# Patient Record
Sex: Female | Born: 1945 | Race: White | Hispanic: No | Smoking: Former smoker
Health system: Southern US, Community
[De-identification: ages and names within clinical notes are randomized; demographics above are authoritative.]

## PROBLEM LIST (undated history)

## (undated) DIAGNOSIS — K529 Noninfective gastroenteritis and colitis, unspecified: Secondary | ICD-10-CM

## (undated) DIAGNOSIS — Z973 Presence of spectacles and contact lenses: Secondary | ICD-10-CM

## (undated) DIAGNOSIS — I493 Ventricular premature depolarization: Secondary | ICD-10-CM

## (undated) DIAGNOSIS — E7849 Other hyperlipidemia: Secondary | ICD-10-CM

## (undated) DIAGNOSIS — E119 Type 2 diabetes mellitus without complications: Secondary | ICD-10-CM

## (undated) DIAGNOSIS — N289 Disorder of kidney and ureter, unspecified: Secondary | ICD-10-CM

## (undated) DIAGNOSIS — M1990 Unspecified osteoarthritis, unspecified site: Secondary | ICD-10-CM

## (undated) DIAGNOSIS — G629 Polyneuropathy, unspecified: Secondary | ICD-10-CM

## (undated) DIAGNOSIS — Z923 Personal history of irradiation: Secondary | ICD-10-CM

## (undated) DIAGNOSIS — M519 Unspecified thoracic, thoracolumbar and lumbosacral intervertebral disc disorder: Secondary | ICD-10-CM

## (undated) DIAGNOSIS — Z8051 Family history of malignant neoplasm of kidney: Secondary | ICD-10-CM

## (undated) DIAGNOSIS — Z8719 Personal history of other diseases of the digestive system: Secondary | ICD-10-CM

## (undated) DIAGNOSIS — R600 Localized edema: Secondary | ICD-10-CM

## (undated) DIAGNOSIS — K760 Fatty (change of) liver, not elsewhere classified: Secondary | ICD-10-CM

## (undated) DIAGNOSIS — M2550 Pain in unspecified joint: Secondary | ICD-10-CM

## (undated) DIAGNOSIS — M549 Dorsalgia, unspecified: Secondary | ICD-10-CM

## (undated) DIAGNOSIS — Z803 Family history of malignant neoplasm of breast: Secondary | ICD-10-CM

## (undated) DIAGNOSIS — J189 Pneumonia, unspecified organism: Secondary | ICD-10-CM

## (undated) DIAGNOSIS — G4700 Insomnia, unspecified: Secondary | ICD-10-CM

## (undated) DIAGNOSIS — Z8042 Family history of malignant neoplasm of prostate: Secondary | ICD-10-CM

## (undated) DIAGNOSIS — K429 Umbilical hernia without obstruction or gangrene: Secondary | ICD-10-CM

## (undated) DIAGNOSIS — F419 Anxiety disorder, unspecified: Secondary | ICD-10-CM

## (undated) DIAGNOSIS — K219 Gastro-esophageal reflux disease without esophagitis: Secondary | ICD-10-CM

## (undated) DIAGNOSIS — H269 Unspecified cataract: Secondary | ICD-10-CM

## (undated) DIAGNOSIS — C801 Malignant (primary) neoplasm, unspecified: Secondary | ICD-10-CM

## (undated) DIAGNOSIS — I10 Essential (primary) hypertension: Secondary | ICD-10-CM

## (undated) DIAGNOSIS — Z800 Family history of malignant neoplasm of digestive organs: Secondary | ICD-10-CM

## (undated) DIAGNOSIS — Z8489 Family history of other specified conditions: Secondary | ICD-10-CM

## (undated) DIAGNOSIS — R002 Palpitations: Secondary | ICD-10-CM

## (undated) DIAGNOSIS — Z87442 Personal history of urinary calculi: Secondary | ICD-10-CM

## (undated) HISTORY — PX: TONSILLECTOMY: SUR1361

## (undated) HISTORY — DX: Localized edema: R60.0

## (undated) HISTORY — DX: Fatty (change of) liver, not elsewhere classified: K76.0

## (undated) HISTORY — DX: Polyneuropathy, unspecified: G62.9

## (undated) HISTORY — DX: Unspecified cataract: H26.9

## (undated) HISTORY — DX: Other hyperlipidemia: E78.49

## (undated) HISTORY — DX: Disorder of kidney and ureter, unspecified: N28.9

## (undated) HISTORY — DX: Insomnia, unspecified: G47.00

## (undated) HISTORY — DX: Family history of malignant neoplasm of breast: Z80.3

## (undated) HISTORY — PX: BREAST BIOPSY: SHX20

## (undated) HISTORY — DX: Family history of malignant neoplasm of kidney: Z80.51

## (undated) HISTORY — DX: Family history of malignant neoplasm of digestive organs: Z80.0

## (undated) HISTORY — PX: FRACTURE SURGERY: SHX138

## (undated) HISTORY — DX: Noninfective gastroenteritis and colitis, unspecified: K52.9

## (undated) HISTORY — PX: APPENDECTOMY: SHX54

## (undated) HISTORY — PX: MYOMECTOMY: SHX85

## (undated) HISTORY — PX: OTHER SURGICAL HISTORY: SHX169

## (undated) HISTORY — DX: Unspecified osteoarthritis, unspecified site: M19.90

## (undated) HISTORY — PX: RIGHT COLECTOMY: SHX853

## (undated) HISTORY — PX: COLON SURGERY: SHX602

## (undated) HISTORY — DX: Pain in unspecified joint: M25.50

## (undated) HISTORY — DX: Dorsalgia, unspecified: M54.9

## (undated) HISTORY — DX: Anxiety disorder, unspecified: F41.9

## (undated) HISTORY — DX: Personal history of urinary calculi: Z87.442

## (undated) HISTORY — DX: Essential (primary) hypertension: I10

## (undated) HISTORY — DX: Pneumonia, unspecified organism: J18.9

## (undated) HISTORY — DX: Family history of malignant neoplasm of prostate: Z80.42

## (undated) HISTORY — DX: Umbilical hernia without obstruction or gangrene: K42.9

## (undated) HISTORY — DX: Gastro-esophageal reflux disease without esophagitis: K21.9

## (undated) HISTORY — PX: BREAST EXCISIONAL BIOPSY: SUR124

## (undated) HISTORY — DX: Palpitations: R00.2

## (undated) HISTORY — PX: WRIST SURGERY: SHX841

## (undated) HISTORY — PX: KNEE ARTHROSCOPY: SUR90

## (undated) HISTORY — PX: TENDON RELEASE: SHX230

---

## 1984-11-01 HISTORY — PX: COLON RESECTION: SHX5231

## 1990-11-01 HISTORY — PX: CHOLECYSTECTOMY: SHX55

## 1999-08-31 ENCOUNTER — Other Ambulatory Visit: Admission: RE | Admit: 1999-08-31 | Discharge: 1999-08-31 | Payer: Self-pay | Admitting: Obstetrics and Gynecology

## 2000-04-29 ENCOUNTER — Encounter: Payer: Self-pay | Admitting: Obstetrics and Gynecology

## 2000-04-29 ENCOUNTER — Encounter: Admission: RE | Admit: 2000-04-29 | Discharge: 2000-04-29 | Payer: Self-pay | Admitting: Obstetrics and Gynecology

## 2000-08-25 ENCOUNTER — Encounter: Payer: Self-pay | Admitting: *Deleted

## 2000-08-25 ENCOUNTER — Encounter: Admission: RE | Admit: 2000-08-25 | Discharge: 2000-08-25 | Payer: Self-pay | Admitting: *Deleted

## 2000-11-14 ENCOUNTER — Other Ambulatory Visit: Admission: RE | Admit: 2000-11-14 | Discharge: 2000-11-14 | Payer: Self-pay | Admitting: Obstetrics and Gynecology

## 2001-01-19 ENCOUNTER — Encounter: Payer: Self-pay | Admitting: Gastroenterology

## 2001-01-19 ENCOUNTER — Encounter: Admission: RE | Admit: 2001-01-19 | Discharge: 2001-01-19 | Payer: Self-pay | Admitting: Gastroenterology

## 2001-01-25 ENCOUNTER — Encounter: Admission: RE | Admit: 2001-01-25 | Discharge: 2001-01-25 | Payer: Self-pay | Admitting: Gastroenterology

## 2001-01-25 ENCOUNTER — Encounter: Payer: Self-pay | Admitting: Gastroenterology

## 2001-02-15 ENCOUNTER — Ambulatory Visit (HOSPITAL_COMMUNITY): Admission: RE | Admit: 2001-02-15 | Discharge: 2001-02-15 | Payer: Self-pay | Admitting: Gastroenterology

## 2001-02-15 ENCOUNTER — Encounter: Payer: Self-pay | Admitting: Gastroenterology

## 2001-03-09 ENCOUNTER — Encounter: Admission: RE | Admit: 2001-03-09 | Discharge: 2001-03-09 | Payer: Self-pay | Admitting: Gastroenterology

## 2001-03-09 ENCOUNTER — Encounter: Payer: Self-pay | Admitting: Gastroenterology

## 2001-05-05 ENCOUNTER — Encounter (INDEPENDENT_AMBULATORY_CARE_PROVIDER_SITE_OTHER): Payer: Self-pay | Admitting: Specialist

## 2001-05-05 ENCOUNTER — Ambulatory Visit (HOSPITAL_COMMUNITY): Admission: RE | Admit: 2001-05-05 | Discharge: 2001-05-05 | Payer: Self-pay | Admitting: Gastroenterology

## 2001-06-09 ENCOUNTER — Encounter: Payer: Self-pay | Admitting: Obstetrics and Gynecology

## 2001-06-09 ENCOUNTER — Encounter: Admission: RE | Admit: 2001-06-09 | Discharge: 2001-06-09 | Payer: Self-pay | Admitting: Obstetrics and Gynecology

## 2001-11-28 ENCOUNTER — Encounter: Payer: Self-pay | Admitting: Family Medicine

## 2001-11-28 ENCOUNTER — Encounter: Admission: RE | Admit: 2001-11-28 | Discharge: 2001-11-28 | Payer: Self-pay | Admitting: Family Medicine

## 2002-06-15 ENCOUNTER — Encounter: Admission: RE | Admit: 2002-06-15 | Discharge: 2002-06-15 | Payer: Self-pay | Admitting: Family Medicine

## 2002-06-15 ENCOUNTER — Encounter: Payer: Self-pay | Admitting: Family Medicine

## 2002-07-10 ENCOUNTER — Other Ambulatory Visit: Admission: RE | Admit: 2002-07-10 | Discharge: 2002-07-10 | Payer: Self-pay | Admitting: Gynecology

## 2003-06-25 ENCOUNTER — Encounter: Payer: Self-pay | Admitting: Family Medicine

## 2003-06-25 ENCOUNTER — Encounter: Admission: RE | Admit: 2003-06-25 | Discharge: 2003-06-25 | Payer: Self-pay | Admitting: Family Medicine

## 2003-07-23 ENCOUNTER — Other Ambulatory Visit: Admission: RE | Admit: 2003-07-23 | Discharge: 2003-07-23 | Payer: Self-pay | Admitting: Gynecology

## 2004-03-25 ENCOUNTER — Encounter (INDEPENDENT_AMBULATORY_CARE_PROVIDER_SITE_OTHER): Payer: Self-pay | Admitting: Specialist

## 2004-03-25 ENCOUNTER — Ambulatory Visit (HOSPITAL_COMMUNITY): Admission: RE | Admit: 2004-03-25 | Discharge: 2004-03-25 | Payer: Self-pay | Admitting: Orthopedic Surgery

## 2004-03-25 ENCOUNTER — Ambulatory Visit (HOSPITAL_BASED_OUTPATIENT_CLINIC_OR_DEPARTMENT_OTHER): Admission: RE | Admit: 2004-03-25 | Discharge: 2004-03-25 | Payer: Self-pay | Admitting: Orthopedic Surgery

## 2004-04-29 ENCOUNTER — Ambulatory Visit (HOSPITAL_COMMUNITY): Admission: RE | Admit: 2004-04-29 | Discharge: 2004-04-29 | Payer: Self-pay | Admitting: Gastroenterology

## 2004-04-29 ENCOUNTER — Encounter (INDEPENDENT_AMBULATORY_CARE_PROVIDER_SITE_OTHER): Payer: Self-pay | Admitting: Specialist

## 2004-06-25 ENCOUNTER — Encounter: Admission: RE | Admit: 2004-06-25 | Discharge: 2004-06-25 | Payer: Self-pay | Admitting: Family Medicine

## 2004-10-22 ENCOUNTER — Other Ambulatory Visit: Admission: RE | Admit: 2004-10-22 | Discharge: 2004-10-22 | Payer: Self-pay | Admitting: Gynecology

## 2005-02-11 ENCOUNTER — Ambulatory Visit: Payer: Self-pay

## 2005-09-07 ENCOUNTER — Encounter: Admission: RE | Admit: 2005-09-07 | Discharge: 2005-09-07 | Payer: Self-pay | Admitting: Family Medicine

## 2005-09-16 ENCOUNTER — Encounter (INDEPENDENT_AMBULATORY_CARE_PROVIDER_SITE_OTHER): Payer: Self-pay | Admitting: *Deleted

## 2005-09-16 ENCOUNTER — Encounter: Admission: RE | Admit: 2005-09-16 | Discharge: 2005-09-16 | Payer: Self-pay | Admitting: Family Medicine

## 2005-10-04 ENCOUNTER — Ambulatory Visit: Payer: Self-pay | Admitting: Pulmonary Disease

## 2005-10-06 ENCOUNTER — Ambulatory Visit: Payer: Self-pay | Admitting: *Deleted

## 2005-10-20 ENCOUNTER — Encounter (INDEPENDENT_AMBULATORY_CARE_PROVIDER_SITE_OTHER): Payer: Self-pay | Admitting: *Deleted

## 2005-10-20 ENCOUNTER — Ambulatory Visit (HOSPITAL_BASED_OUTPATIENT_CLINIC_OR_DEPARTMENT_OTHER): Admission: RE | Admit: 2005-10-20 | Discharge: 2005-10-20 | Payer: Self-pay | Admitting: General Surgery

## 2005-10-20 ENCOUNTER — Ambulatory Visit (HOSPITAL_COMMUNITY): Admission: RE | Admit: 2005-10-20 | Discharge: 2005-10-20 | Payer: Self-pay | Admitting: General Surgery

## 2005-10-20 ENCOUNTER — Encounter: Admission: RE | Admit: 2005-10-20 | Discharge: 2005-10-20 | Payer: Self-pay | Admitting: General Surgery

## 2005-10-22 ENCOUNTER — Ambulatory Visit: Payer: Self-pay | Admitting: Pulmonary Disease

## 2006-04-06 ENCOUNTER — Ambulatory Visit (HOSPITAL_BASED_OUTPATIENT_CLINIC_OR_DEPARTMENT_OTHER): Admission: RE | Admit: 2006-04-06 | Discharge: 2006-04-06 | Payer: Self-pay | Admitting: Orthopedic Surgery

## 2006-04-27 ENCOUNTER — Other Ambulatory Visit: Admission: RE | Admit: 2006-04-27 | Discharge: 2006-04-27 | Payer: Self-pay | Admitting: Gynecology

## 2006-05-25 ENCOUNTER — Encounter: Admission: RE | Admit: 2006-05-25 | Discharge: 2006-05-25 | Payer: Self-pay | Admitting: General Surgery

## 2006-10-13 ENCOUNTER — Encounter: Admission: RE | Admit: 2006-10-13 | Discharge: 2006-10-13 | Payer: Self-pay | Admitting: Gynecology

## 2007-07-26 ENCOUNTER — Other Ambulatory Visit: Admission: RE | Admit: 2007-07-26 | Discharge: 2007-07-26 | Payer: Self-pay | Admitting: Gynecology

## 2007-07-28 ENCOUNTER — Ambulatory Visit (HOSPITAL_COMMUNITY): Admission: RE | Admit: 2007-07-28 | Discharge: 2007-07-28 | Payer: Self-pay | Admitting: Gynecology

## 2007-07-28 ENCOUNTER — Ambulatory Visit: Payer: Self-pay | Admitting: *Deleted

## 2007-12-21 ENCOUNTER — Encounter: Admission: RE | Admit: 2007-12-21 | Discharge: 2007-12-21 | Payer: Self-pay | Admitting: Family Medicine

## 2008-02-05 ENCOUNTER — Encounter: Admission: RE | Admit: 2008-02-05 | Discharge: 2008-02-05 | Payer: Self-pay | Admitting: Gastroenterology

## 2008-04-17 ENCOUNTER — Encounter: Admission: RE | Admit: 2008-04-17 | Discharge: 2008-04-17 | Payer: Self-pay | Admitting: Gastroenterology

## 2008-06-14 ENCOUNTER — Encounter: Admission: RE | Admit: 2008-06-14 | Discharge: 2008-06-14 | Payer: Self-pay | Admitting: Family Medicine

## 2009-03-03 ENCOUNTER — Encounter: Admission: RE | Admit: 2009-03-03 | Discharge: 2009-03-03 | Payer: Self-pay | Admitting: Family Medicine

## 2009-06-18 ENCOUNTER — Ambulatory Visit: Payer: Self-pay | Admitting: Gynecology

## 2009-06-24 ENCOUNTER — Encounter: Admission: RE | Admit: 2009-06-24 | Discharge: 2009-06-24 | Payer: Self-pay | Admitting: Gynecology

## 2009-08-13 ENCOUNTER — Ambulatory Visit (HOSPITAL_COMMUNITY): Admission: RE | Admit: 2009-08-13 | Discharge: 2009-08-13 | Payer: Self-pay | Admitting: Surgery

## 2009-08-13 ENCOUNTER — Encounter (INDEPENDENT_AMBULATORY_CARE_PROVIDER_SITE_OTHER): Payer: Self-pay | Admitting: Surgery

## 2009-11-07 ENCOUNTER — Encounter: Admission: RE | Admit: 2009-11-07 | Discharge: 2009-11-07 | Payer: Self-pay | Admitting: Family Medicine

## 2009-12-02 HISTORY — PX: OTHER SURGICAL HISTORY: SHX169

## 2009-12-11 ENCOUNTER — Inpatient Hospital Stay (HOSPITAL_COMMUNITY): Admission: AD | Admit: 2009-12-11 | Discharge: 2009-12-12 | Payer: Self-pay | Admitting: Urology

## 2010-01-21 ENCOUNTER — Other Ambulatory Visit: Admission: RE | Admit: 2010-01-21 | Discharge: 2010-01-21 | Payer: Self-pay | Admitting: Gynecology

## 2010-01-21 ENCOUNTER — Ambulatory Visit: Payer: Self-pay | Admitting: Gynecology

## 2010-03-26 ENCOUNTER — Encounter: Admission: RE | Admit: 2010-03-26 | Discharge: 2010-03-26 | Payer: Self-pay | Admitting: Gynecology

## 2010-11-21 ENCOUNTER — Encounter: Payer: Self-pay | Admitting: Family Medicine

## 2010-11-22 ENCOUNTER — Encounter: Payer: Self-pay | Admitting: Obstetrics and Gynecology

## 2011-01-21 LAB — COMPREHENSIVE METABOLIC PANEL
Albumin: 4 g/dL (ref 3.5–5.2)
Alkaline Phosphatase: 51 U/L (ref 39–117)
CO2: 26 mEq/L (ref 19–32)
Calcium: 9.3 mg/dL (ref 8.4–10.5)
Creatinine, Ser: 1.27 mg/dL — ABNORMAL HIGH (ref 0.4–1.2)
Glucose, Bld: 164 mg/dL — ABNORMAL HIGH (ref 70–99)
Potassium: 4.4 mEq/L (ref 3.5–5.1)
Total Bilirubin: 0.7 mg/dL (ref 0.3–1.2)
Total Protein: 7.5 g/dL (ref 6.0–8.3)

## 2011-01-21 LAB — CBC
Hemoglobin: 14.4 g/dL (ref 12.0–15.0)
MCHC: 33.6 g/dL (ref 30.0–36.0)
MCV: 89 fL (ref 78.0–100.0)
Platelets: 259 10*3/uL (ref 150–400)
RBC: 4.8 MIL/uL (ref 3.87–5.11)
RDW: 13.7 % (ref 11.5–15.5)
WBC: 13.1 10*3/uL — ABNORMAL HIGH (ref 4.0–10.5)

## 2011-01-21 LAB — PROTIME-INR
INR: 1.08 (ref 0.00–1.49)
Prothrombin Time: 13.9 s (ref 11.6–15.2)

## 2011-01-21 LAB — APTT: aPTT: 25 s (ref 24–37)

## 2011-01-22 ENCOUNTER — Other Ambulatory Visit: Payer: Self-pay | Admitting: Gastroenterology

## 2011-02-04 LAB — DIFFERENTIAL
Lymphocytes Relative: 26 % (ref 12–46)
Lymphs Abs: 2.2 10*3/uL (ref 0.7–4.0)
Monocytes Relative: 8 % (ref 3–12)
Neutro Abs: 5.3 10*3/uL (ref 1.7–7.7)

## 2011-02-04 LAB — BASIC METABOLIC PANEL
BUN: 18 mg/dL (ref 6–23)
CO2: 25 mEq/L (ref 19–32)
GFR calc Af Amer: >60 mL/min (ref >60)
GFR calc non Af Amer: >60 mL/min (ref >60)
Glucose, Bld: 90 mg/dL (ref 70–99)
Sodium: 140 mEq/L (ref 135–145)

## 2011-02-04 LAB — CBC
HCT: 40.6 % (ref 36.0–46.0)
MCHC: 33.6 g/dL (ref 30.0–36.0)
Platelets: 233 10*3/uL (ref 150–400)
RDW: 13.8 % (ref 11.5–15.5)

## 2011-02-17 ENCOUNTER — Other Ambulatory Visit: Payer: Self-pay | Admitting: Family Medicine

## 2011-02-17 DIAGNOSIS — Z1231 Encounter for screening mammogram for malignant neoplasm of breast: Secondary | ICD-10-CM

## 2011-03-19 NOTE — Op Note (Signed)
NAME:  Lauren [name], Lauren [name]                      ACCOUNT NO.:  [id number]   MEDICAL RECORD NO.:  [id number]                   PATIENT TYPE:  AMB   LOCATION:  DSC                                  FACILITY:  [facility]   PHYSICIAN:  Matthew A. Mina Marble, M.D.           DATE OF BIRTH:  [birth year]   DATE OF PROCEDURE:  DATE OF DISCHARGE:                                 OPERATIVE REPORT   PREOPERATIVE DIAGNOSES:  1. Left lateral epicondylitis.  2. Left wrist internal derangement.  3. Left wrist volar cyst.  4. Left thumb carpometacarpal joint arthritis.   POSTOPERATIVE DIAGNOSES:  1. Left lateral epicondylitis.  2. Left wrist internal derangement.  3. Left wrist volar cyst.  4. Left thumb carpometacarpal joint arthritis.   PROCEDURE:  1. Left wrist arthroscopy with debridement of TFCC tear.  2. Left lateral epicondyle release with partial ostectomy.  3. Open excision left wrist volar cyst.  4. Injection of left thumb CMC joint.   SURGEON:  Dr. Artist Pais. Weingold.   ASSISTANT:  __________  .   ANESTHESIA:  General.   TOURNIQUET TIME:  The tourniquet time is 1 hour, 10 minutes.   COMPLICATIONS:  None.   DRAINS:  None.   OPERATIVE REPORT:  The patient was taken to the operating room after the  induction of adequate general anesthesia.  The left upper extremity was  prepped and draped in the usual sterile fashion.  An Esmarch was used to  exsanguinate the limb.  The tourniquet was then inflated 250 mmHg at this  point in time.  The patient's upper extremity was padded and placed in the  concept retraction tower.  Twelve pounds of countertraction was placed  across the radial carpal joint.  A standard __________  arthroscopic portal  was established.  Visualization revealed degenerative changes throughout the  entire radial carpal joint.  There was a small thinning of the TFCC and a  small tear.  This was debrided using the suction shaver, and ArthroCare  wand.  After this was done,  the instrument was removed from the wrist.  The  patient's hand was removed from the traction tower and fully pronated.  An  incision was made between the FCU and ECU tendon.  Dissection was carried  down between these two, and a cystic mass was encountered over the  __________  articulation.  This was removed in its entirety and sent for  pathologic confirmation.  At this point in time, a third incision was made  over the lateral aspect of the elbow over the lateral epicondylar area.  The  incision was taken out through the subcutaneous tissues for 4 to 5 cm.  The  common extensor was carefully subperiosteally elevated off the lateral  epicondyle.  ECRB insertion was frayed, degenerated and debrided down to  stable tissue.  The wound was thoroughly irrigated.  The interval between  the extensor carpal radialis longus and the common extensor was  closed with  inverted 0 Vicryl sutures after decortication of the lateral epicondyle.  The skin was then closed with running 3-0 Prolene subcuticular stitch.  The  3-0 Prolene was used on the cyst excision, and 5-0 nylon on the arthroscopic  portals.  At this point in time, under sterile conditions, combination of  0.25% plain Marcaine and 1 cc of Kenalog-40 was injected in the [facility] joint of  the left thumb.  The patient was then placed in sterile dressing, Xeroform,  4 x 4s, fluffs, and a splint with the forearm in neutral and the elbow bent  at 90 degrees.  The patient tolerated the procedure well, and went to  recovery in stable fashion.                                               Artist Pais Mina Marble, M.D.    MAW/MEDQ  D:  03/26/2004  T:  03/26/2004  Job:  [id number]

## 2011-03-19 NOTE — Op Note (Signed)
NAME:  Lauren [name], Lauren [name]                      ACCOUNT NO.:  [id number]   MEDICAL RECORD NO.:  [id number]                   PATIENT TYPE:  AMB   LOCATION:  ENDO                                 FACILITY:  [facility]   PHYSICIAN:  Petra Kuba, M.D.                 DATE OF BIRTH:  [birth year]   DATE OF PROCEDURE:  04/29/2004  DATE OF DISCHARGE:                                 OPERATIVE REPORT   PROCEDURE:  Colonoscopy.   INDICATION:  Diarrhea, family history of colon cancer, due for colonic  screening.  Consent was signed after risks, benefits, methods, options  thoroughly discussed in the office.   MEDICINES USED:  1. Demerol 120.  2. Versed 12.   PROCEDURE:  Rectal inspection is pertinent for external hemorrhoids.  Digital exam was negative.  Video colonoscope was inserted and with  abdominal pressure able to be advanced to the anastomosis.  It was a colon  end to small bowel side anastomosis.  We were easily able to advance a short  ways up to the terminal ileum which was normal.  Photo documentation and  random biopsies were obtained and put in the first container.  No  abnormalities were seen on insertion, the scope was slowly withdrawn.  The  prep was adequate.  There was minimal liquid stool that required washing and  suctioning.  On slow withdrawal through the colon, random biopsies of the  colon were obtained but no abnormalities were seen.  Anorectal pull through  on retroflexion confirmed some small hemorrhoids.  The scope was reinserted  a short ways up the left side of the colon, air was suctioned and the scope  removed.  The patient tolerated the procedure well.  There was no obvious  immediate complication.   ENDOSCOPIC DIAGNOSES:  1. Internal/external hemorrhoids.  2. Small bowel side-to-colon end anastomosis.  3. Otherwise within normal limits to the terminal ileum, status post random     biopsy __________.   PLAN:  Await pathology to rule out microscopic colitis.   Continue medicines.  Repeat screening in 5 years.  Consider small bowel follow through __________  sed rate and sprue next if her diarrhea continues.                                               Petra Kuba, M.D.    MEM/MEDQ  D:  04/29/2004  T:  04/29/2004  Job:  [id number]   cc:   C. Duane Lope, M.D.  [address]  [place]  [place] [address]  Fax: [phone]

## 2011-03-19 NOTE — Op Note (Signed)
NAME:  Lauren [name], Lauren [name]            ACCOUNT NO.:  [id number]   MEDICAL RECORD NO.:  [id number]          PATIENT TYPE:  AMB   LOCATION:  DSC                          FACILITY:  [facility]   PHYSICIAN:  Matthew A. Weingold, M.D.DATE OF BIRTH:  [birth year]   DATE OF PROCEDURE:  04/06/2006  DATE OF DISCHARGE:                                 OPERATIVE REPORT   PREOPERATIVE DIAGNOSIS:  Left thumb carpometacarpal joint arthritis.   POSTOPERATIVE DIAGNOSIS:  Left thumb carpometacarpal joint arthritis.   PROCEDURE:  Left thumb carpometacarpal joint suspensionplasty with abductor  pollicis longus tendon transfer.   SURGEON:  Artist Pais. Mina Marble, M.D.   ANESTHESIA:  General.   TOURNIQUET TIME:  1 hour.   No complications or drains.   OPERATIVE REPORT:  The patient was taken to the operating room, where after  the induction of adequate general endotracheal anesthesia the left upper  extremity was prepped and draped in the usual sterile fashion.  An Esmarch  was used to exsanguinate the limb, the tourniquet inflated to 250 mmHg.  At  this point in time an incision was made longitudinally at the [facility] joint,  left thumb, and the incision was carried down through the skin and  subcutaneous tissues.  The snuffbox artery was carefully identified and  retracted.  The capsule overlying the CMC joint was incised longitudinally.  The [facility] joint was dissected down to the level of the base of the thumb  metacarpal and the trapezium.  Once this was done, the trapezium was  carefully excised using a combination of curettes, osteotomes and rongeurs.  Once this was done, a [facility] synovectomy was performed.  After that she had a  transosseous canal created in the thumb metacarpal base using a combination  of a hand drill and awls.  Once this was done a second incision was made  over the musculotendinous junction of the first dorsal compartment.  The APL  tendon was identified at the musculotendinous junction and  transected and  drawn into the distal wound.  At this point in time the APL tendon, which  was still distally-based, was passed from dorsal volar out the thumb  metacarpal, wrapped around the flexor carpi radialis tendon twice under  appropriate tension, and sutured to itself using a 2-0 Fibrewire x2, thus  creating the suspension.  The wound was then thoroughly irrigated.  The  capsule overlying the [facility] joint was repaired with 4-0 Vicryl and both skin  incisions with 3-0 Prolene subcuticular stitches.  Steri-Strips, 4 x 4's and  fluffs and a radial dorsal splint was applied.  The patient tolerated the  procedure well and was taken to the recovery room in stable fashion.      Artist Pais Mina Marble, M.D.  Electronically Signed    MAW/MEDQ  D:  04/06/2006  T:  04/06/2006  Job:  [id number]

## 2011-03-19 NOTE — Procedures (Signed)
[facility]  Patient:    Lauren [name], Lauren [name]                     MRN: [id number] Proc. Date: 05/05/01 Attending:  Petra Kuba, M.D.                           Procedure Report  PROCEDURE:  Esophagogastroduodenoscopy with biopsy.  INDICATIONS FOR PROCEDURE:  A patient with upper abdominal discomfort, abdominal upper GI. Consent was signed after risks, benefits, methods, and options were thoroughly discussed in the office on multiple occasions.  MEDICINES USED:  Demerol 75, Versed 7.  DESCRIPTION OF PROCEDURE:  The video endoscope was inserted by direct vision. A quick look at the posterior pharynx was normal. The scope was inserted down a normal esophagus where a tiny to small hiatal hernia was seen but no significant reflux changes, advanced in the stomach where a lot of bilious secretions were seen and suctioned as well as washed and suctioned. The scope was inserted through an antrum pertinent for some linear antritis through a normal pylorus into a normal duodenal bulb and around the C loop to a normal second portion of the duodenum. The scope was slowly withdrawn back to the bulb and no obvious abnormality was seen in the duodenum or in the bulb. The scope was withdrawn back to the stomach and retroflexed. On retroflexed view of the stomach, there were some proximal gastric polyps the largest was in the proximal stomach and was cold biopsied on straight visualization since we were unable to get adequate positioning to biopsy on retroflexed visualization. The fundus was normal. The scope was straightened and straight visualization of the stomach did reveal a few more small polyps along the proximal and distal greater curve as well as a few on the lesser curve and multiple biopsies were obtained but not all biopsies were sampled. We did take a few biopsies of the antrum and a few of the proximal stomach as well to rule out Helicobacter and confirm the  gastritis. The scope was then slowly withdrawn after air was suctioned and again a good look at the esophagus on slow withdrawal was normal. The patient tolerated the procedure well. There was no obvious or immediate complications.  ENDOSCOPIC DIAGNOSIS: 1. Tiny to small hiatal hernia. 2. Linear antritis status post biopsy. 3. Multiple gastric polyps some of which I biopsied but not all. 4. Otherwise within normal limits EGD.  PLAN:  Await pathology. Continue current management and follow-up p.r.n. or in two to three months to recheck symptoms and make sure no further workup plans are needed.  DD:  05/05/01 TD:  05/05/01 Job: [id number]

## 2011-03-19 NOTE — Op Note (Signed)
NAME:  Lauren [name], Lauren [name]            ACCOUNT NO.:  [id number]   MEDICAL RECORD NO.:  [id number]          PATIENT TYPE:  AMB   LOCATION:  DSC                          FACILITY:  [facility]   PHYSICIAN:  Rose Phi. Maple Hudson, M.D.   DATE OF BIRTH:  31-Aug-1946   DATE OF PROCEDURE:  DATE OF DISCHARGE:                                 OPERATIVE REPORT   PREOPERATIVE DIAGNOSIS:  Abnormal right breast mammogram.   POSTOPERATIVE DIAGNOSIS:  Abnormal right breast mammogram.   OPERATION:  Excision of right breast mass with needle localization and  specimen mammogram.   SURGEON:  Rose Phi. Maple Hudson, M.D.   ANESTHESIA:  MAC.   OPERATIVE PROCEDURE:  Prior to coming to the operating room a localizing  wire had been placed in the upper inner quadrant of the right breast.   The patient was placed on the operating table with the arms extended on the  arm board and the right breast prepped and draped in the usual fashion.  A  somewhat curved incision centered on the previously placed wire was outlined  with a marking pencil and the area thoroughly infiltrated with a local  anesthetic mixture.   Incision was made, and the wire and surrounding tissue were excised.  Hemostasis obtained with cautery.   Specimen mammogram confirmed the removal of the lesion.   Wound was closed with subcuticular 4-0 Monocryl and Steri-Strips.  Dressing  applied. The patient was transferred to the recovery room in satisfactory  condition having tolerated the procedure well.      Rose Phi. Maple Hudson, M.D.  Electronically Signed     PRY/MEDQ  D:  10/20/2005  T:  10/22/2005  Job:  [id number]

## 2011-03-30 ENCOUNTER — Ambulatory Visit: Payer: Self-pay

## 2011-03-30 ENCOUNTER — Ambulatory Visit
Admission: RE | Admit: 2011-03-30 | Discharge: 2011-03-30 | Disposition: A | Discharge status: Home / Self Care | Payer: BC Managed Care – PPO | Source: Ambulatory Visit | Attending: Family Medicine | Admitting: Family Medicine

## 2011-03-30 DIAGNOSIS — Z1231 Encounter for screening mammogram for malignant neoplasm of breast: Secondary | ICD-10-CM

## 2011-05-28 ENCOUNTER — Encounter (INDEPENDENT_AMBULATORY_CARE_PROVIDER_SITE_OTHER): Payer: Self-pay | Admitting: Surgery

## 2011-05-28 ENCOUNTER — Ambulatory Visit (INDEPENDENT_AMBULATORY_CARE_PROVIDER_SITE_OTHER): Payer: Medicare HMO | Admitting: General Surgery

## 2011-05-28 ENCOUNTER — Encounter (INDEPENDENT_AMBULATORY_CARE_PROVIDER_SITE_OTHER): Payer: Self-pay | Admitting: General Surgery

## 2011-05-28 VITALS — BP 144/90 | HR 100 | Temp 97.0°F | Ht 65.0 in | Wt 228.0 lb

## 2011-05-28 DIAGNOSIS — E669 Obesity, unspecified: Secondary | ICD-10-CM

## 2011-05-28 DIAGNOSIS — K429 Umbilical hernia without obstruction or gangrene: Secondary | ICD-10-CM

## 2011-05-28 NOTE — Progress Notes (Signed)
Subjective:     Patient ID: Lauren [name], female   DOB: 11-16-1945, 65 y.o.   MRN: [id number]  HPIPatient has a small umbilical hernia that she is noted more recently she's had a previous left gallbladder that P. Young in 1992 and she had had a colectomy for diverticulitis by him several years prior to that she said she had not noticed the weakness at the navel until recently her mother has been in a nursing home and there were apparent her mother's home for site old and doing more strenuous activities and usual patient also retired recently and reported the question of the navel to her medical physician and he felt that she probably had a small umbilical hernia   Review of Systems Past Medical History  Diagnosis Date  . Pneumonia   . GERD (gastroesophageal reflux disease)   . Hypertension   . Neuropathy   . Endometriosis   . Colitis   . Arthritis   . Nipple discharge   . Reflux   . Menopause           Objective:   Physical Exam  The patient on physical exam has a very small fascial defect at the umbilicus when she strains you can feel probably about a centimeter area that protrudes but it reduces spontaneously. On checking with the ultrasound and this is a very small hernia at the fascia below Dr. Maple Hudson do the laparotomy for sigmoid colectomy shows no evidence of any incisional weaknesses or problems patient is significantly overweight and I would recommend that she continue exercise and trying to get her weight into a better range her daughter in law her daughter who works for Dr. Sharol Roussel his caution her  not doing the lefting or exercise don't do anything.  I would say her that she really get a weight reduction diet and exercise and see if she can pull probably 20 pounds of she is having symptoms with a hernia I can repair it is a patient with ventral X. patch convenient time. Patient is to have a knee replacement sometime in the future and I think if she can get her weight off it  would greatly help with the recuperation and possibly prevent in need of her knee surgery. I showed her how to check to see if a hernia is getting called and the like to see her for followup in approximately 3 months   Assessment:         Plan:     To start exercise and diet weight reduction plan and let me follow her up in 3 months she may like to get a abdominal binder to where think the hernia so small that is unnecessary. She will call if she is having more symptoms and she is aware that I can repair the hernia with a patch general anesthesia the patient had a convenient time

## 2011-05-28 NOTE — Patient Instructions (Signed)
Try to increase physical activity diet management and hopefully can decrease your weight 20-30 time if you're having pain that the activity as making the umbilical hernia worse he can be repaired with general anesthesia as an outpatient in the near future. The weight reduction with good help greatly in a few nature knee surgery that plans sometime in the future I would like to see you in followup in approximately 3 months to see how things are progressing.

## 2011-05-31 ENCOUNTER — Ambulatory Visit (INDEPENDENT_AMBULATORY_CARE_PROVIDER_SITE_OTHER): Payer: Self-pay | Admitting: Surgery

## 2011-07-07 ENCOUNTER — Other Ambulatory Visit: Payer: Self-pay | Admitting: Orthopaedic Surgery

## 2011-07-07 DIAGNOSIS — M25561 Pain in right knee: Secondary | ICD-10-CM

## 2011-07-10 ENCOUNTER — Ambulatory Visit
Admission: RE | Admit: 2011-07-10 | Discharge: 2011-07-10 | Disposition: A | Discharge status: Home / Self Care | Payer: Self-pay | Source: Ambulatory Visit | Attending: Orthopaedic Surgery | Admitting: Orthopaedic Surgery

## 2011-07-10 DIAGNOSIS — M25561 Pain in right knee: Secondary | ICD-10-CM

## 2011-08-05 ENCOUNTER — Encounter: Payer: Self-pay | Admitting: *Deleted

## 2011-08-05 ENCOUNTER — Ambulatory Visit (INDEPENDENT_AMBULATORY_CARE_PROVIDER_SITE_OTHER): Payer: Medicare HMO | Admitting: Women's Health

## 2011-08-05 ENCOUNTER — Encounter: Payer: Self-pay | Admitting: Women's Health

## 2011-08-05 VITALS — BP 160/90

## 2011-08-05 DIAGNOSIS — B373 Candidiasis of vulva and vagina: Secondary | ICD-10-CM

## 2011-08-05 DIAGNOSIS — R82998 Other abnormal findings in urine: Secondary | ICD-10-CM

## 2011-08-05 DIAGNOSIS — R3915 Urgency of urination: Secondary | ICD-10-CM

## 2011-08-05 DIAGNOSIS — N809 Endometriosis, unspecified: Secondary | ICD-10-CM | POA: Insufficient documentation

## 2011-08-05 DIAGNOSIS — N390 Urinary tract infection, site not specified: Secondary | ICD-10-CM

## 2011-08-05 DIAGNOSIS — I10 Essential (primary) hypertension: Secondary | ICD-10-CM | POA: Insufficient documentation

## 2011-08-05 DIAGNOSIS — B3731 Acute candidiasis of vulva and vagina: Secondary | ICD-10-CM

## 2011-08-05 MED ORDER — CEPHALEXIN 500 MG PO CAPS: 500.0000 mg | ORAL_CAPSULE | Freq: Two times a day (BID) | ORAL | Status: AC

## 2011-08-05 MED ORDER — NYSTATIN-TRIAMCINOLONE 100000-0.1 UNIT/GM-% EX OINT: TOPICAL_OINTMENT | Freq: Two times a day (BID) | CUTANEOUS | Status: DC

## 2011-08-05 NOTE — Progress Notes (Signed)
  Resents with a complaint of increased urinary urgency and frequency, states the burning sensation has lessened. UA today does show 2-4 WBCs and trace bacteria. She is on the last day of a prescription of Macrobid given by her primary care. Was also treated for yeast with Diflucan 150. Denies a fever, or back pain.  Exam: Abdomen is obese nontender. External genitalia is erythematous, and appears atrophic. Speculum exam no discharge vaginal walls are not erythematous. Wet prep is positive for yeast. Bimanual no CMT no adnexal fullness or tenderness or suprapubic tenderness.  Plan: Send urine for C&S, treat with Keflex 500 twice a day for 3 days. Has allergies to Cipro, sulfa, and penicillin from childhood, but states thinks has taken Keflex in the past without a problem. Mycolog ointment to external genitalia twice a day, loose clothes, open to air.  Call for results of culture, return to office if no relief. Also instructed to schedule annual exam with Dr. Audie Box.

## 2011-08-18 ENCOUNTER — Encounter: Payer: Self-pay | Admitting: Women's Health

## 2011-08-18 ENCOUNTER — Ambulatory Visit (INDEPENDENT_AMBULATORY_CARE_PROVIDER_SITE_OTHER): Payer: Medicare HMO | Admitting: Women's Health

## 2011-08-18 VITALS — BP 150/90

## 2011-08-18 DIAGNOSIS — R82998 Other abnormal findings in urine: Secondary | ICD-10-CM

## 2011-08-18 DIAGNOSIS — R3915 Urgency of urination: Secondary | ICD-10-CM

## 2011-08-18 DIAGNOSIS — N898 Other specified noninflammatory disorders of vagina: Secondary | ICD-10-CM

## 2011-08-18 DIAGNOSIS — N9489 Other specified conditions associated with female genital organs and menstrual cycle: Secondary | ICD-10-CM

## 2011-08-18 MED ORDER — ESTRADIOL 10 MCG VA TABS: 1.0000 | ORAL_TABLET | VAGINAL | Status: DC

## 2011-08-18 NOTE — Progress Notes (Signed)
Addended byCammie Mcgee T on: 08/18/2011 11:57 AM   Modules accepted: Orders

## 2011-08-18 NOTE — Progress Notes (Signed)
  Presents with a complaint of urinary urgency, denies pain or back discomfort. Has a history of kidney stones but states it is a different sensation. Also states has vaginal discomfort with sensitivity. She states she's been unable to sleep well due to this discomfort. Treated for yeast 3 weeks ago with Diflucan states  helped somewhat but the problem is still persistent. Denies any vaginal itching, discharge or fever. Postmenopausal on no HRT with no bleeding. UA 1-2 WBCs, + bacteria will check a urine culture. She had a negative urine C&S 3 weeks ago.  Exam abdomen is obese nontender, external genitalia is erythematous at introitus but appears atrophic. Speculum exam vaginal walls were atrophic no discharge or erythema notrd. Wet prep is positive for small amount of yeast.  Plan: Due to the atrophy will treat with vagifem 1 applicator at bedtime for 2 weeks and then 2 times per week thereafter to see if that will help with some of the sensitivity and atrophy. Will continue the Mycolog at at bedtime to external genitalia prn.

## 2011-09-03 ENCOUNTER — Encounter (INDEPENDENT_AMBULATORY_CARE_PROVIDER_SITE_OTHER): Payer: Medicare HMO | Admitting: General Surgery

## 2011-09-15 ENCOUNTER — Encounter (INDEPENDENT_AMBULATORY_CARE_PROVIDER_SITE_OTHER): Payer: Self-pay | Admitting: General Surgery

## 2011-09-16 ENCOUNTER — Encounter (INDEPENDENT_AMBULATORY_CARE_PROVIDER_SITE_OTHER): Payer: Self-pay

## 2011-09-16 ENCOUNTER — Ambulatory Visit (INDEPENDENT_AMBULATORY_CARE_PROVIDER_SITE_OTHER): Payer: Medicare HMO | Admitting: General Surgery

## 2011-09-16 VITALS — BP 132/88 | HR 72 | Temp 97.8°F | Resp 12 | Ht 65.0 in | Wt 227.0 lb

## 2011-09-16 DIAGNOSIS — K429 Umbilical hernia without obstruction or gangrene: Secondary | ICD-10-CM

## 2011-09-16 NOTE — Patient Instructions (Signed)
Tried to get back on her excess weight reduction plan when you desire to have the umbilical hernia repaired call and see one  of my partners .  They will see you and repair your  hernia

## 2011-09-16 NOTE — Progress Notes (Signed)
Subjective:     Patient ID: Lauren [name], female   DOB: [birth year], 65 y.o.   MRN: [id number]  HPIThis lead now returns come in by her husband and when I saw her approximately 4-5 months ago she was going to try to lose weight and and did an umbilical hernia repair since then her mother who has been chronically ill in her late 43s basically died and the patient states that she was put in all of her time to care for mother agreed and etc. and has not really been doing exercise on a weight reduction management her hernia is the same small defect she had a previous laparoscopic cholecystectomy by Dr. Maple Hudson years ago and she still wants to try to lose weight and not plan on doing anything about the hernia at this time . She understands and I'm planning to retire at the next month and when she desires to proceed with her hernia repair will call in the region checked the one one partners who can repair her hernia   Review of Systems     Objective:   Physical ExamBP 132/88  Pulse 72  Temp(Src) 97.8 F (36.6 C) (Temporal)  Resp 12  Ht 5\' 5"  (1.651 m)  Wt 227 lb (102.967 kg)  BMI 37.77 kg/m2  Patient still has a small umbilical hernia that protrudes when she strains is not symptomatic and her weight is unchanged at about 228 pounds. She will see [place] in the future when she desires to proceed with umbilical herniorrhaphy as a out pt    Assessment:     No change in umbilical hernia    Plan:     Return prn

## 2011-09-29 ENCOUNTER — Telehealth: Payer: Self-pay

## 2011-09-29 NOTE — Telephone Encounter (Signed)
PT. IS IMPROVED USING VAGIFEM SAMPLES AND HAS I LEFT BUT CAN NOT AFFORD $70 TO GET RX FOR 8 OF THEM. WANTS TO KNOW IF THERE ARE ANY OTHER CHEAPER OPTIONS. WANTS TO DISCUSS WITH YOU.

## 2011-09-30 MED ORDER — ESTRADIOL 10 MCG VA TABS: 10.0000 ug | ORAL_TABLET | VAGINAL | Status: DC

## 2011-09-30 NOTE — Telephone Encounter (Signed)
PT. NOTIFIED OF NANCY'S NOTE AND WANTS TO CONTINUE VAGIFEM. GAVE PT SAMPLES OF RX.

## 2011-09-30 NOTE — Telephone Encounter (Signed)
Lauren [name], please call patient- estradiol 0.02% cream use twice weekly sparingly review with patient slightly more systemic estrogen absorption. Approximately $25 at [facility]. OR Premarin vaginal cream, coupon for $15  can only be used once, and also offer her more samples of the Vagifem.

## 2011-12-03 ENCOUNTER — Other Ambulatory Visit: Payer: Self-pay | Admitting: Women's Health

## 2011-12-03 ENCOUNTER — Ambulatory Visit (INDEPENDENT_AMBULATORY_CARE_PROVIDER_SITE_OTHER): Payer: Medicare Other | Admitting: Women's Health

## 2011-12-03 ENCOUNTER — Encounter: Payer: Self-pay | Admitting: Women's Health

## 2011-12-03 DIAGNOSIS — R350 Frequency of micturition: Secondary | ICD-10-CM

## 2011-12-03 DIAGNOSIS — N390 Urinary tract infection, site not specified: Secondary | ICD-10-CM

## 2011-12-03 LAB — URINALYSIS W MICROSCOPIC + REFLEX CULTURE
Bilirubin Urine: NEGATIVE
Casts: NONE SEEN
Crystals: NONE SEEN
Glucose, UA: NEGATIVE mg/dL
pH: 6 (ref 5.0–8.0)

## 2011-12-03 NOTE — Progress Notes (Signed)
Patient ID: Lauren [name], female   DOB: Jun 17, 1946, 66 y.o.   MRN: [id number] Presents with a complaint urinary frequency, urgency, slight burning at initiation of urination, denies any pain at the end of the stream of urination. UA today shows trace leukocytes, 3-6 WBCs and a few bacteria. Denies any discharge or vaginal itching. Postmenopausal no HRT, with no bleeding.Is using Vagifem twice weekly. Has had some problems with yeast and UTIs.  Exam: External genitalia within normal limits, speculum exam scant white discharge, no erythema was noted. Bimanual no tenderness or fullness with exam.  Plan: Urine culture pending. Will continue with Vagifem twice weekly.

## 2011-12-06 ENCOUNTER — Ambulatory Visit: Payer: Medicare HMO | Admitting: Women's Health

## 2011-12-07 ENCOUNTER — Telehealth: Payer: Self-pay | Admitting: *Deleted

## 2011-12-07 NOTE — Telephone Encounter (Signed)
Pt is called to follow up with urine culture results 12/03/11. Please advise

## 2011-12-08 NOTE — Telephone Encounter (Signed)
Pt informed with the below. 

## 2011-12-08 NOTE — Telephone Encounter (Signed)
Lauren [name] had called 12/06/11 and left message. Urine culture was negative. She needs no medication.

## 2011-12-27 ENCOUNTER — Other Ambulatory Visit (HOSPITAL_COMMUNITY)
Admission: RE | Admit: 2011-12-27 | Discharge: 2011-12-27 | Disposition: A | Discharge status: Home / Self Care | Payer: Medicare Other | Source: Ambulatory Visit | Attending: Obstetrics and Gynecology | Admitting: Obstetrics and Gynecology

## 2011-12-27 ENCOUNTER — Ambulatory Visit (INDEPENDENT_AMBULATORY_CARE_PROVIDER_SITE_OTHER): Payer: Medicare Other | Admitting: Women's Health

## 2011-12-27 ENCOUNTER — Encounter: Payer: Self-pay | Admitting: Women's Health

## 2011-12-27 VITALS — BP 148/98 | Ht 64.5 in | Wt 228.0 lb

## 2011-12-27 DIAGNOSIS — Z780 Asymptomatic menopausal state: Secondary | ICD-10-CM

## 2011-12-27 DIAGNOSIS — Z1211 Encounter for screening for malignant neoplasm of colon: Secondary | ICD-10-CM

## 2011-12-27 DIAGNOSIS — N951 Menopausal and female climacteric states: Secondary | ICD-10-CM

## 2011-12-27 DIAGNOSIS — Z124 Encounter for screening for malignant neoplasm of cervix: Secondary | ICD-10-CM | POA: Insufficient documentation

## 2011-12-27 DIAGNOSIS — Z01419 Encounter for gynecological examination (general) (routine) without abnormal findings: Secondary | ICD-10-CM

## 2011-12-27 MED ORDER — ESTRADIOL 10 MCG VA TABS: 10.0000 ug | ORAL_TABLET | VAGINAL | Status: DC

## 2011-12-27 NOTE — Progress Notes (Signed)
Lauren [name] 1946-07-03 [id number]    History:    The patient presents for breast and pelvic exam. Postmenopausal on no HRT with no bleeding. Using Vagifem twice weekly with good relief of vaginal dryness. History of normal Paps. History of benign breast biopsies on both left and right breast. Has not had a bone density in many years. Normal colonoscopy in 2012.   Past medical history, past surgical history, family history and social history were all reviewed and documented in the EPIC chart. Retired this past year.   ROS:  A  ROS was performed and pertinent positives and negatives are included in the history.  Exam:  Filed Vitals:   12/27/11 0916  BP: 148/98    General appearance:  Normal Head/Neck:  Normal, without cervical or supraclavicular adenopathy. Thyroid:  Symmetrical, normal in size, without palpable masses or nodularity. Respiratory  Effort:  Normal  Auscultation:  Clear without wheezing or rhonchi Cardiovascular  Auscultation:  Regular rate, without rubs, murmurs or gallops  Edema/varicosities:  Not grossly evident Abdominal  Soft,nontender, without masses, guarding or rebound.  Liver/spleen:  No organomegaly noted  Hernia:  None appreciated  Skin  Inspection:  Grossly normal  Palpation:  Grossly normal Neurologic/psychiatric  Orientation:  Normal with appropriate conversation.  Mood/affect:  Normal  Genitourinary    Breasts: Examined lying and sitting.     Right: Without masses, retractions, discharge or axillary adenopathy.     Left: Without masses, retractions, discharge or axillary adenopathy.   Inguinal/mons:  Normal without inguinal adenopathy  External genitalia:  Normal  BUS/Urethra/Skene's glands:  Normal  Bladder:  Normal  Vagina:  Normal  Cervix:  Normal  Uterus:   normal in size, shape and contour.  Midline and mobile  Adnexa/parametria:     Rt: Without masses or tenderness.   Lt: Without masses or tenderness.  Anus and  perineum: Normal  Digital rectal exam: Normal sphincter tone without palpated masses or tenderness  Assessment/Plan:  66 y.o. M. WF G2 P2  for breast and pelvic exam.   Normal postmenopausal exam on Vagifem twice weekly and no bleeding Hypertension/colitis-primary care labs and meds.  Plan:. Schedule DEXA here. Home safety and fall prevention discussed. SBE's, annual mammogram, exercise, calcium rich diet, vitamin D 2000 daily encouraged. Discussed importance of decreasing calories and increasing exercise for weight loss for health. Weight Watchers reviewed,. Uses Vagifem one applicator twice weekly for vaginal dryness with fair relief. Prescription, proper use given and reviewed minimal  systemic absorption. Instructed to get the zostovac and Pneumovax vaccine at primary care. Pap only.  Harrington Challenger [facility], 1:43 PM 12/27/2011

## 2011-12-28 ENCOUNTER — Other Ambulatory Visit: Payer: Self-pay | Admitting: Gynecology

## 2011-12-28 DIAGNOSIS — Z13820 Encounter for screening for osteoporosis: Secondary | ICD-10-CM

## 2011-12-28 DIAGNOSIS — Z780 Asymptomatic menopausal state: Secondary | ICD-10-CM

## 2012-01-06 ENCOUNTER — Ambulatory Visit (INDEPENDENT_AMBULATORY_CARE_PROVIDER_SITE_OTHER): Payer: Medicare Other

## 2012-01-06 DIAGNOSIS — Z13820 Encounter for screening for osteoporosis: Secondary | ICD-10-CM

## 2012-03-03 ENCOUNTER — Other Ambulatory Visit: Payer: Self-pay | Admitting: Gynecology

## 2012-03-08 ENCOUNTER — Ambulatory Visit (INDEPENDENT_AMBULATORY_CARE_PROVIDER_SITE_OTHER): Payer: Medicare Other | Admitting: Gynecology

## 2012-03-08 ENCOUNTER — Telehealth: Payer: Self-pay | Admitting: *Deleted

## 2012-03-08 ENCOUNTER — Encounter: Payer: Self-pay | Admitting: Gynecology

## 2012-03-08 ENCOUNTER — Other Ambulatory Visit: Payer: Self-pay | Admitting: *Deleted

## 2012-03-08 DIAGNOSIS — N644 Mastodynia: Secondary | ICD-10-CM

## 2012-03-08 NOTE — Telephone Encounter (Signed)
appt set at [facility] 03/10/12 @ 3:50

## 2012-03-08 NOTE — Progress Notes (Signed)
Addended by: Valeda Malm L on: 03/08/2012 10:56 AM   Modules accepted: Orders

## 2012-03-08 NOTE — Telephone Encounter (Signed)
Message copied by Mckinley Jewel, Tanyon Alipio L on Wed Mar 08, 2012 11:00 AM ------      Message from: Dara Lords      Created: Wed Mar 08, 2012  9:53 AM       Aliayah Tyer, schedule a diagnostic mammogram and ultrasound at the breast Center reference 3 week history of tenderness in the left breast left tail of Spence. Physical exam is negative. Prior biopsy site in this area.

## 2012-03-08 NOTE — Patient Instructions (Signed)
Office will contact you to arrange diagnostic mammography and ultrasound. If tenderness persists for a month represent for reexamination.

## 2012-03-08 NOTE — Progress Notes (Signed)
Patient presents having called to schedule her routine mammogram and told him she was having some tenderness in her left breast and they've referred her to the office before doing the mammogram. Notes of the last several weeks left breast tenderness in the tail of Spence radiating towards the nipple. Notes that she has had some tenderness in this breast on an off over the years and has had a benign breast biopsy in this area. No nipple discharge or other symptoms.  Exam with Sherrilyn Rist chaperone present Both breast examined lying and sitting. Right with well-healed biopsy scar, no masses, retractions, discharge, adenopathy. Left with well-healed biopsy scar, no masses, retractions, discharge, adenopathy.  Area she is pointing to is in the tail of Spence region and there are no palpable abnormalities.  Assessment and plan: Mastodynia left tail of Spence. We'll plan diagnostic mammography and ultrasound. If negative expectant management with repeat exam in a month if her symptoms persist.  If testing otherwise than normal we'll triage based on the results.

## 2012-03-10 ENCOUNTER — Ambulatory Visit
Admission: RE | Admit: 2012-03-10 | Discharge: 2012-03-10 | Disposition: A | Discharge status: Home / Self Care | Payer: Medicare Other | Source: Ambulatory Visit | Attending: Gynecology | Admitting: Gynecology

## 2012-03-10 DIAGNOSIS — N644 Mastodynia: Secondary | ICD-10-CM

## 2012-03-23 ENCOUNTER — Other Ambulatory Visit: Payer: Self-pay | Admitting: Gynecology

## 2012-03-23 DIAGNOSIS — Z1231 Encounter for screening mammogram for malignant neoplasm of breast: Secondary | ICD-10-CM

## 2012-04-04 ENCOUNTER — Other Ambulatory Visit: Payer: Self-pay | Admitting: *Deleted

## 2012-04-04 ENCOUNTER — Encounter: Payer: Self-pay | Admitting: Gynecology

## 2012-04-04 ENCOUNTER — Ambulatory Visit (INDEPENDENT_AMBULATORY_CARE_PROVIDER_SITE_OTHER): Payer: Medicare Other | Admitting: Gynecology

## 2012-04-04 ENCOUNTER — Telehealth: Payer: Self-pay | Admitting: *Deleted

## 2012-04-04 DIAGNOSIS — R0789 Other chest pain: Secondary | ICD-10-CM

## 2012-04-04 DIAGNOSIS — N644 Mastodynia: Secondary | ICD-10-CM

## 2012-04-04 DIAGNOSIS — R0781 Pleurodynia: Secondary | ICD-10-CM

## 2012-04-04 MED ORDER — IBUPROFEN 800 MG PO TABS: 800.0000 mg | ORAL_TABLET | Freq: Three times a day (TID) | ORAL | Status: AC | PRN

## 2012-04-04 NOTE — Progress Notes (Signed)
Patient presents in follow up of left breast discomfort. She had left tell of Spence discomfort that we ultimately did a diagnostic mammogram and ultrasound which were negative. She notes that that discomfort actually has gotten better but that she has more of a midline 6:00 periphery of breast or chest wall discomfort of the last 2 months that has persisted. She knows that when she moves her arm to brush her hair or pushing over this area. No palpable abnormalities on self breast exam.  Exam was Sherrilyn Rist chaperone present Breasts examined lying and sitting without masses retractions discharge adenopathy. The area she's pointing to is the junction 6:00 of the breast and chest wall. There is no palpable or overlying skin changes.  Assessment and plan: Unclear if this is breast tissue tenderness versus chest wall tenderness. Recommended ribs films, ibuprofen 800 mg twice a day x2 weeks, heat to the area, avoidance of her underwire bra with a sports bra and then go from there. If the discomfort would continue that I am going to refer to Gen. Surgery to see if any further studies need to be done.  Patient agrees with the plan.

## 2012-04-04 NOTE — Telephone Encounter (Signed)
Message copied by Libby Maw on Tue Apr 04, 2012  3:31 PM ------      Message from: Dara Lords      Created: Tue Apr 04, 2012 11:27 AM       Schedule chest x-ray and left rib films reference tenderness overlying lower rib cage midline anterior chest wall

## 2012-04-04 NOTE — Patient Instructions (Signed)
Office will contact you to arrange the rib/chest x-ray films. Start ibuprofen 800 mg 2 times daily for 2 weeks. Heat to the tender area. Switch to a sports bra. Follow up if discomfort continues I'll have general surgery see you.

## 2012-04-04 NOTE — Telephone Encounter (Signed)
Patient informed appt set up at [facility] on 04/07/12 @ 1:45pm. Order in pc.

## 2012-04-07 ENCOUNTER — Ambulatory Visit (HOSPITAL_COMMUNITY)
Admission: RE | Admit: 2012-04-07 | Discharge: 2012-04-07 | Disposition: A | Discharge status: Home / Self Care | Payer: Medicare Other | Source: Ambulatory Visit | Attending: Gynecology | Admitting: Gynecology

## 2012-04-07 ENCOUNTER — Ambulatory Visit
Admission: RE | Admit: 2012-04-07 | Discharge: 2012-04-07 | Disposition: A | Discharge status: Home / Self Care | Payer: Medicare Other | Source: Ambulatory Visit | Attending: Gynecology | Admitting: Gynecology

## 2012-04-07 DIAGNOSIS — R0781 Pleurodynia: Secondary | ICD-10-CM

## 2012-04-07 DIAGNOSIS — R079 Chest pain, unspecified: Secondary | ICD-10-CM | POA: Insufficient documentation

## 2012-04-07 DIAGNOSIS — Z1231 Encounter for screening mammogram for malignant neoplasm of breast: Secondary | ICD-10-CM

## 2012-04-07 DIAGNOSIS — R071 Chest pain on breathing: Secondary | ICD-10-CM | POA: Insufficient documentation

## 2012-04-07 DIAGNOSIS — R0789 Other chest pain: Secondary | ICD-10-CM

## 2012-04-07 DIAGNOSIS — Z87891 Personal history of nicotine dependence: Secondary | ICD-10-CM | POA: Insufficient documentation

## 2012-07-24 ENCOUNTER — Encounter (INDEPENDENT_AMBULATORY_CARE_PROVIDER_SITE_OTHER): Payer: Self-pay | Admitting: Surgery

## 2012-07-24 ENCOUNTER — Ambulatory Visit (INDEPENDENT_AMBULATORY_CARE_PROVIDER_SITE_OTHER): Payer: Medicare Other | Admitting: Surgery

## 2012-07-24 VITALS — BP 120/78 | HR 96 | Temp 97.6°F | Ht 65.0 in | Wt 226.6 lb

## 2012-07-24 DIAGNOSIS — K429 Umbilical hernia without obstruction or gangrene: Secondary | ICD-10-CM

## 2012-07-24 NOTE — Patient Instructions (Signed)
[facility]  HERNIA REPAIR POST OP INSTRUCTIONS  Always review your discharge instruction sheet given to you by the facility where your surgery was performed.  1. A  prescription for pain medication may be given to you upon discharge.  Take your pain medication as prescribed.  If narcotic pain medicine is not needed, then you may take acetaminophen (Tylenol) or ibuprofen (Advil) as needed. 2. Take your usually prescribed medications unless otherwise directed. 3. If you need a refill on your pain medication, please contact your pharmacy.  They will contact our office to request authorization. Prescriptions will not be filled after 5 pm daily or on weekends. 4. You should follow a light diet the first 24 hours after arrival home, such as soup and crackers or toast.  Be sure to include plenty of fluids daily.  Resume your normal diet the day after surgery. 5. Most patients will experience some swelling and bruising around the surgical site.  Ice packs and reclining will help.  Swelling and bruising can take several days to resolve.  6. It is common to experience some constipation if taking pain medication after surgery.  Increasing fluid intake and taking a stool softener (such as Colace) will usually help or prevent this problem from occurring.  A mild laxative (Milk of Magnesia or Miralax) should be taken according to package directions if there are no bowel movements after 48 hours. 7. Unless discharge instructions indicate otherwise, you may remove your bandages 24-48 hours after surgery, and you may shower at that time.  You may have steri-strips (small skin tapes) in place directly over the incision.  These strips should be left on the skin for 7-10 days.  If your surgeon used skin glue on the incision, you may shower in 24 hours.  The glue will flake off over the next 2-3 weeks.  Any sutures or staples will be removed at the office during your follow-up visit. 8. ACTIVITIES:  You  may resume regular (light) daily activities beginning the next day-such as daily self-care, walking, climbing stairs-gradually increasing activities as tolerated.  You may have sexual intercourse when it is comfortable.  Refrain from any heavy lifting or straining until approved by your doctor.  You may drive when you are no longer taking prescription pain medication, you can comfortably wear a seatbelt, and you can safely maneuver your car and apply brakes. 9. You should see your doctor in the office for a follow-up appointment approximately 2-3 weeks after your surgery.  Make sure that you call for this appointment within a day or two after you arrive home to insure a convenient appointment time.  WHEN TO CALL YOUR DOCTOR: 1. Fever greater than 101.0 2. Inability to urinate 3. Persistent nausea and/or vomiting 4. Extreme swelling or bruising 5. Continued bleeding from incision 6. Increased pain, redness, or drainage from the incision  The clinic staff is available to answer your questions during regular business hours.  Please don't hesitate to call and ask to speak to one of the nurses for clinical concerns.  If you have a medical emergency, go to the nearest emergency room or call 911.  A surgeon from [facility] is always on call for the hospital.   [facility] [address], [address], [place], [place]  [address]  [phone] ? [phone] ? FAX [phone] [email or web address]   

## 2012-07-24 NOTE — Progress Notes (Signed)
General Surgery [facility]  Chief Complaint  Patient presents with  . Umbilical Hernia    increasing in size, mild pain - primary care is Dr. Vianne Bulls    HISTORY: Patient is a 66 year old white female well known to our surgical practice having undergone right colectomy and a laparoscopic cholecystectomy in the past by Dr. Francina Ames. Patient was seen over one year ago regarding a small umbilical hernia. This is likely an incisional hernia at the site of laparoscopy at the umbilicus. It has gradually increased in size and is becoming more symptomatic. Patient notes an outward protrusion. She has discomfort with physical activity. She presents today to discuss surgical repair.  Patient denies any signs or symptoms of intestinal obstruction.  Past Medical History  Diagnosis Date  . Pneumonia   . GERD (gastroesophageal reflux disease)   . Neuropathy   . Colitis   . Arthritis   . Reflux   . Menopause   . History of kidney stones   . Hypertension   . Endometriosis   . Insomnia   . Vitamin d deficiency   . Umbilical hernia   . Diabetes mellitus      Current Outpatient Prescriptions  Medication Sig Dispense Refill  . balsalazide (COLAZAL) 750 MG capsule       . Cholecalciferol (VITAMIN D PO) Take 4,000 Int'l Units by mouth daily. Twice a week      . cholestyramine (QUESTRAN) 4 GM/DOSE powder Take by mouth daily.        . clonazePAM (KLONOPIN) 0.5 MG tablet Take 0.5 mg by mouth 2 (two) times daily as needed.        . Estradiol (VAGIFEM) 10 MCG TABS Place 1 tablet (10 mcg total) vaginally 2 (two) times a week.  60 tablet  0  . finasteride (PROSCAR) 5 MG tablet Take 0.25 mg by mouth daily.        . furosemide (LASIX) 20 MG tablet       . metFORMIN (GLUCOPHAGE-XR) 500 MG 24 hr tablet       . pantoprazole (PROTONIX) 40 MG tablet Take 40 mg by mouth 2 (two) times daily.       . quinapril (ACCUPRIL) 20 MG tablet Take 40 mg by mouth at bedtime.           Allergies  Allergen Reactions  . Ciprocin-Fluocin-Procin (Fluocinolone Acetonide)   . Penicillins   . Percocet (Oxycodone-Acetaminophen)   . Sulfur   . Tape   . Tetanus Toxoids      Family History  Problem Relation Age of Onset  . Hypertension Father   . Heart disease Father   . Diabetes Father   . Cancer Father     colon, prostate, and kidney  . Hypertension Mother   . Heart disease Maternal Grandmother   . Diabetes Maternal Grandmother   . Heart disease Maternal Grandfather   . Diabetes Maternal Grandfather   . Cancer Maternal Grandfather     pt unaware of what kind     History   Social History  . Marital Status: Married    Spouse Name: N/A    Number of Children: N/A  . Years of Education: N/A   Social History Main Topics  . Smoking status: Former Games developer  . Smokeless tobacco: Never Used   Comment: quit 1983  . Alcohol Use: Yes     rare  . Drug Use: No  . Sexually Active: Yes -- Female partner(s)  Birth Control/ Protection: Post-menopausal   Other Topics Concern  . None   Social History Narrative  . None     REVIEW OF SYSTEMS - PERTINENT POSITIVES ONLY: No signs or symptoms of obstruction. Intermittent discomfort with physical activity. Always reducible.  EXAM: Filed Vitals:   07/24/12 0949  BP: 120/78  Pulse: 96  Temp: 97.6 F (36.4 C)    HEENT: normocephalic; pupils equal and reactive; sclerae clear; dentition good; mucous membranes moist NECK:  symmetric on extension; no palpable anterior or posterior cervical lymphadenopathy; no supraclavicular masses; no tenderness CHEST: clear to auscultation bilaterally without rales, rhonchi, or wheezes CARDIAC: regular rate and rhythm without significant murmur; peripheral pulses are full ABDOMEN: soft without distension; bowel sounds present; no mass; no hepatosplenomegaly; well-healed surgical incisions. Small to moderate sized hernia beneath the umbilicus at the site of previous laparoscopy  consistent with an umbilical or incisional hernia; reducible EXT:  non-tender without edema; no deformity NEURO: no gross focal deficits; no sign of tremor   LABORATORY RESULTS: See [facility] (CHL-Epic) for most recent results   RADIOLOGY RESULTS: See [facility] (CHL-Epic) for most recent results   IMPRESSION: Umbilical or incisional hernia, reducible, mildly symptomatic  PLAN: I discussed with the patient and her husband the indications for repair. I provided him with written literature to review. I have recommended repair with mesh. This can be performed as an outpatient procedure. We discussed restrictions after the procedure. We have discussed the chance of recurrence. They understand and wish to proceed.  The risks and benefits of the procedure have been discussed at length with the patient.  The patient understands the proposed procedure, potential alternative treatments, and the course of recovery to be expected.  All of the patient's questions have been answered at this time.  The patient wishes to proceed with surgery.  Velora Heckler, MD, FACS General & Endocrine Surgery [facility]   Visit Diagnoses: 1. Umbilical hernia     Primary Care Physician: Daisy Floro, MD

## 2012-09-15 ENCOUNTER — Other Ambulatory Visit: Payer: Self-pay | Admitting: Orthopaedic Surgery

## 2012-09-15 DIAGNOSIS — M5410 Radiculopathy, site unspecified: Secondary | ICD-10-CM

## 2012-09-15 DIAGNOSIS — R52 Pain, unspecified: Secondary | ICD-10-CM

## 2012-09-19 ENCOUNTER — Telehealth (INDEPENDENT_AMBULATORY_CARE_PROVIDER_SITE_OTHER): Payer: Self-pay

## 2012-09-19 NOTE — Telephone Encounter (Signed)
Pt called wanting to know if she should delay her hernia repair since she is currently going thru injections, anti inflammatory  and steroid therapy for a back problem. Pt advised that steroids po over time can affect how well she heals from surgery. Pt also advised she will be limited in her movements for a few days after surgery.  Pt advised anti inflammatories can increase her bleeding risk for after surgery.  Pt states she will review this with her Orthopedic surgeon and may cx hernia until after her back heals. Pt to call back with decision.

## 2012-09-20 ENCOUNTER — Ambulatory Visit
Admission: RE | Admit: 2012-09-20 | Discharge: 2012-09-20 | Disposition: A | Discharge status: Home / Self Care | Payer: Medicare Other | Source: Ambulatory Visit | Attending: Orthopaedic Surgery | Admitting: Orthopaedic Surgery

## 2012-09-20 DIAGNOSIS — M5410 Radiculopathy, site unspecified: Secondary | ICD-10-CM

## 2012-09-20 DIAGNOSIS — R52 Pain, unspecified: Secondary | ICD-10-CM

## 2012-09-22 ENCOUNTER — Other Ambulatory Visit: Payer: Medicare Other

## 2012-09-22 ENCOUNTER — Encounter (HOSPITAL_BASED_OUTPATIENT_CLINIC_OR_DEPARTMENT_OTHER): Payer: Self-pay | Admitting: *Deleted

## 2012-09-22 NOTE — Progress Notes (Signed)
Pt states rescheduling until jan 2014. She has notified office.

## 2012-09-25 ENCOUNTER — Telehealth (INDEPENDENT_AMBULATORY_CARE_PROVIDER_SITE_OTHER): Payer: Self-pay

## 2012-09-25 NOTE — Telephone Encounter (Signed)
Received msg from surgery scheduling that pt has cx surgery due to continued back pain. She is being seen by her Back MD and wants to wait till this has resolved.

## 2012-10-03 ENCOUNTER — Encounter (HOSPITAL_BASED_OUTPATIENT_CLINIC_OR_DEPARTMENT_OTHER): Admission: RE | Payer: Self-pay | Source: Ambulatory Visit

## 2012-10-03 ENCOUNTER — Ambulatory Visit (HOSPITAL_BASED_OUTPATIENT_CLINIC_OR_DEPARTMENT_OTHER): Admission: RE | Admit: 2012-10-03 | Payer: Medicare Other | Source: Ambulatory Visit | Admitting: Surgery

## 2012-10-03 HISTORY — DX: Personal history of other diseases of the digestive system: Z87.19

## 2012-10-03 SURGERY — REPAIR, HERNIA, UMBILICAL, ADULT
Anesthesia: General

## 2012-12-11 ENCOUNTER — Other Ambulatory Visit: Payer: Self-pay | Admitting: Otolaryngology

## 2012-12-11 DIAGNOSIS — R1310 Dysphagia, unspecified: Secondary | ICD-10-CM

## 2012-12-11 DIAGNOSIS — K219 Gastro-esophageal reflux disease without esophagitis: Secondary | ICD-10-CM

## 2012-12-18 ENCOUNTER — Other Ambulatory Visit: Payer: Medicare Other

## 2012-12-22 ENCOUNTER — Ambulatory Visit
Admission: RE | Admit: 2012-12-22 | Discharge: 2012-12-22 | Disposition: A | Discharge status: Home / Self Care | Payer: Medicare Other | Source: Ambulatory Visit | Attending: Otolaryngology | Admitting: Otolaryngology

## 2012-12-22 DIAGNOSIS — R1310 Dysphagia, unspecified: Secondary | ICD-10-CM

## 2012-12-22 DIAGNOSIS — K219 Gastro-esophageal reflux disease without esophagitis: Secondary | ICD-10-CM

## 2013-01-26 ENCOUNTER — Encounter: Payer: Self-pay | Admitting: Women's Health

## 2013-01-26 ENCOUNTER — Ambulatory Visit (INDEPENDENT_AMBULATORY_CARE_PROVIDER_SITE_OTHER): Payer: Medicare Other | Admitting: Women's Health

## 2013-01-26 VITALS — BP 124/84 | Ht 64.5 in | Wt 226.0 lb

## 2013-01-26 DIAGNOSIS — E119 Type 2 diabetes mellitus without complications: Secondary | ICD-10-CM | POA: Insufficient documentation

## 2013-01-26 DIAGNOSIS — IMO0001 Reserved for inherently not codable concepts without codable children: Secondary | ICD-10-CM

## 2013-01-26 DIAGNOSIS — Z01419 Encounter for gynecological examination (general) (routine) without abnormal findings: Secondary | ICD-10-CM

## 2013-01-26 DIAGNOSIS — N952 Postmenopausal atrophic vaginitis: Secondary | ICD-10-CM

## 2013-01-26 NOTE — Progress Notes (Signed)
DOROTHEA YOW 1946/07/18 [id number]    History:    The patient presents for gyn exam.  Postmenopausal with no bleeding using Vagifem twice weekly with good relief of vaginal dryness. History of fibroids and myomectomy. Hypertension/diabetes/GERD/colitis/insomnia-primary care manages. History of benign breast biopsies both breasts. Normal colonoscopy 2012. DEXA normal 12/2011 T score -0.3 spine, hip average -0.3. Normal Pap history.   Past medical history, past surgical history, family history and social history were all reviewed and documented in the EPIC chart. Retired from [employer]. Husband health issues/congestive heart failure. Both parents diabetes and hypertension. Father colon cancer.   Exam:  Filed Vitals:   01/26/13 0853  BP: 124/84    General appearance:  Normal Head/Neck:  Normal, without cervical or supraclavicular adenopathy. Thyroid:  Symmetrical, normal in size, without palpable masses or nodularity. Respiratory  Effort:  Normal  Auscultation:  Clear without wheezing or rhonchi Cardiovascular  Auscultation:  Regular rate, without rubs, murmurs or gallops  Edema/varicosities:  Not grossly evident Abdominal  Soft,nontender, without masses, guarding or rebound.  Liver/spleen:  No organomegaly noted  Hernia:  None appreciated  Skin  Inspection:  Grossly normal  Palpation:  Grossly normal Neurologic/psychiatric  Orientation:  Normal with appropriate conversation.  Mood/affect:  Normal  Genitourinary    Breasts: Examined lying and sitting.     Right: Without masses, retractions, discharge or axillary adenopathy.     Left: Without masses, retractions, discharge or axillary adenopathy.   Inguinal/mons:  Normal without inguinal adenopathy  External genitalia:  Normal  BUS/Urethra/Skene's glands:  Normal  Bladder:  Normal  Vagina:  Normal  Cervix:  Normal  Uterus:   normal in size, shape and contour.  Midline and mobile  Adnexa/parametria:     Rt: Without masses  or tenderness.   Lt: Without masses or tenderness.  Anus and perineum: Normal  Digital rectal exam: Normal sphincter tone without palpated masses or tenderness  Assessment/Plan:  67 y.o. MWF G2P2 for gyn exam with complaint of chronic back pain.   Atrophic vaginitis-Vagifem Hypertension/diabetes/GERD/colitis-primary care labs and meds Normal DEXA 2013 Obesity Back pain-primary care and orthopedist  Plan: SBE's, continue annual mammogram, calcium rich diet, vitamin D 2000 daily encouraged. Reviewed importance of increasing regular exercise and decreasing calories for weight loss and health. Home safety and fall prevention discussed. Vagifem twice weekly for dryness prescription, proper use, slight systemic absorption reviewed. Pap normal 2013, new screening guidelines reviewed.    Harrington Challenger [facility]NP, 10:01 AM 01/26/2013

## 2013-01-26 NOTE — Patient Instructions (Signed)

## 2013-01-29 ENCOUNTER — Encounter: Payer: Self-pay | Admitting: Gynecology

## 2013-03-22 ENCOUNTER — Telehealth (INDEPENDENT_AMBULATORY_CARE_PROVIDER_SITE_OTHER): Payer: Self-pay

## 2013-03-22 NOTE — Telephone Encounter (Signed)
Per Dr Ardine Eng  Request pt advised if she needs eval hiatal hernia she will need to be referred to Dr Abbey Chatters or Dr Daphine Deutscher. Appt made with Dr Abbey Chatters. Pt aware.

## 2013-04-04 ENCOUNTER — Other Ambulatory Visit: Payer: Self-pay | Admitting: Gynecology

## 2013-04-04 DIAGNOSIS — Z1231 Encounter for screening mammogram for malignant neoplasm of breast: Secondary | ICD-10-CM

## 2013-04-12 ENCOUNTER — Encounter (INDEPENDENT_AMBULATORY_CARE_PROVIDER_SITE_OTHER): Payer: Self-pay

## 2013-04-13 ENCOUNTER — Telehealth (INDEPENDENT_AMBULATORY_CARE_PROVIDER_SITE_OTHER): Payer: Self-pay | Admitting: General Surgery

## 2013-04-13 ENCOUNTER — Encounter (INDEPENDENT_AMBULATORY_CARE_PROVIDER_SITE_OTHER): Payer: Self-pay | Admitting: General Surgery

## 2013-04-13 ENCOUNTER — Other Ambulatory Visit (INDEPENDENT_AMBULATORY_CARE_PROVIDER_SITE_OTHER): Payer: Self-pay | Admitting: General Surgery

## 2013-04-13 ENCOUNTER — Ambulatory Visit (INDEPENDENT_AMBULATORY_CARE_PROVIDER_SITE_OTHER): Payer: Medicare Other | Admitting: General Surgery

## 2013-04-13 VITALS — BP 158/82 | HR 78 | Resp 16 | Ht 64.75 in | Wt 226.6 lb

## 2013-04-13 DIAGNOSIS — K449 Diaphragmatic hernia without obstruction or gangrene: Secondary | ICD-10-CM

## 2013-04-13 DIAGNOSIS — K219 Gastro-esophageal reflux disease without esophagitis: Secondary | ICD-10-CM

## 2013-04-13 NOTE — Patient Instructions (Signed)
Prior to considering any type of surgery for reflux and hiatal hernia, we need to get a pH monitoring study, and esophageal manometry study, and a gastric emptying study. If the results of these suggest that your symptoms are coming from the hiatal hernia and reflux, then we will discuss all of your surgical options.

## 2013-04-13 NOTE — Telephone Encounter (Signed)
Appt made with Dr Ewing Schlein for 05/24/2013 @ 9:45 am. Patient aware. Awaiting Dr Abbey Chatters to dictate office note- then will send records.

## 2013-04-13 NOTE — Progress Notes (Signed)
Patient ID: Lauren [name], female   DOB: [birth year], 67 y.o.   MRN: [id number]  No chief complaint on file.   HPI Lauren [name] is a 67 y.o. female.   HPI  She is being seen at the request of Dr. Ewing Schlein to discuss possible surgery for hiatal hernia and gastroesophageal reflux disease. She has a globus sensation in her throat. She has no dysphagia. She does have a known small hiatal hernia which has been demonstrated on a barium swallow study. She was initially seen in our office regarding an umbilical hernia but has become interested in possibly having the hiatal hernia repaired as well.  She takes a proton pump inhibitor which seemed to help some with her symptoms. She also takes Zantac.  Past Medical History  Diagnosis Date  . GERD (gastroesophageal reflux disease)   . Neuropathy   . Arthritis   . Menopause   . History of kidney stones   . Hypertension   . Endometriosis   . Insomnia   . Vitamin D deficiency   . Umbilical hernia   . Diabetes mellitus   . History of ulcerative colitis     Past Surgical History  Procedure Laterality Date  . Colon surgery    . Wrist surgery      both  . Myomectomy    . Kidney stone removed  2/11  . Right colectomy    . Tendon release    . Right ureteroscopic stone extraction  12-11-2009  DR [facility]  . Left thumb carpometacarpal joint suspensionplasty  04-06-2006  DR Mina Marble  . Excision right breast mass   10-20-2005  DR Theron Arista [facility]  . Left wrist arthroscopy w/ debridement and removal cyst  03-26-2004  DR Mina Marble  . Cholecystectomy  1992  . Breast biopsy  08-13-2009  DR [facility]    EXCISION LEFT NIPPLE DUCT  . Colon resection  1986    ULCERATIVE COLITIS    Family History  Problem Relation Age of Onset  . Hypertension Father   . Heart disease Father   . Diabetes Father   . Cancer Father     colon, prostate, and kidney  . Hypertension Mother   . Heart disease Maternal Grandmother   . Diabetes Maternal Grandmother   . Heart  disease Maternal Grandfather   . Diabetes Maternal Grandfather   . Cancer Maternal Grandfather     pt unaware of what kind    Social History History  Substance Use Topics  . Smoking status: Former Smoker -- 1.00 packs/day for 10 years    Types: Cigarettes    Quit date: 09/22/1982  . Smokeless tobacco: Never Used     Comment: quit 1983  . Alcohol Use: No     Comment: rare    Allergies  Allergen Reactions  . Percocet (Oxycodone-Acetaminophen) Other (See Comments)    RESPIRATORY DISTRESS  . Ciprocin-Fluocin-Procin (Fluocinolone Acetonide) Hives  . Penicillins Hives  . Sulfa Antibiotics Hives  . Tape   . Tetanus Toxoids Hives    Current Outpatient Prescriptions  Medication Sig Dispense Refill  . balsalazide (COLAZAL) 750 MG capsule       . Cholecalciferol (VITAMIN D PO) Take 4,000 Int'l Units by mouth daily. Twice a week      . cholestyramine (QUESTRAN) 4 GM/DOSE powder Take by mouth daily.        . clonazePAM (KLONOPIN) 0.5 MG tablet Take 0.5 mg by mouth 2 (two) times daily as needed.        Marland Kitchen  Diclofenac Sodium (VOLTAREN PO) Take by mouth.      . Estradiol (VAGIFEM) 10 MCG TABS Place 1 tablet (10 mcg total) vaginally 2 (two) times a week.  60 tablet  0  . furosemide (LASIX) 20 MG tablet       . glucose blood (ACCU-CHEK AVIVA) test strip 1 each by Other route as needed for other. Use as instructed      . HYDROcodone-acetaminophen (NORCO) 7.5-325 MG per tablet Take 1 tablet by mouth every 6 (six) hours as needed for pain.      . metFORMIN (GLUCOPHAGE-XR) 500 MG 24 hr tablet       . methocarbamol (ROBAXIN) 500 MG tablet Take 500 mg by mouth daily. 1 daily prn      . pantoprazole (PROTONIX) 40 MG tablet Take 40 mg by mouth 2 (two) times daily.       . quinapril (ACCUPRIL) 20 MG tablet Take 40 mg by mouth at bedtime.       . traMADol (ULTRAM) 50 MG tablet Take 50 mg by mouth every 6 (six) hours as needed for pain.       No current facility-administered medications for this  visit.    Review of Systems Review of Systems  Constitutional: Negative for activity change and appetite change.  Gastrointestinal: Positive for nausea and abdominal pain.    Blood pressure 158/82, pulse 78, resp. rate 16, height 5' 4.75" (1.645 m), weight 226 lb 9.6 oz (102.785 kg).  Physical Exam Physical Exam  Constitutional: No distress.  Obese female  HENT:  Head: Normocephalic and atraumatic.  Neck: Neck supple.  Cardiovascular: Normal rate and regular rhythm.   Pulmonary/Chest: Effort normal and breath sounds normal.  Abdominal: Soft. She exhibits no mass. There is no tenderness.  Small upper bountiful scars. Subumbilical scar with reducible bulge. Lower midline scar.  Musculoskeletal: She exhibits no edema.  Lymphadenopathy:    She has no cervical adenopathy.  Skin: Skin is warm and dry.    Data Reviewed Barium swallow. No true Dr. Ewing Schlein.  Dr. Ardine Eng note.  Assessment    1. Hiatal hernia. It is not clear to me how significant her gastroesophageal reflux is. Her biscuits complaint is a globus sensation and I do not know if this is related to postnasal drip phenomenon or reflux disease. She is morbidly obese so the success rate of a laparoscopic hiatal hernia  and fundoplication is less. Of note is that laparoscopic gastric bypass and even laparoscopic banding with hernia repair can have a better long-term result and we discussed this.However, she needs some more testing to see how significant the reflux is.  2. 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  3.  Reducible umbilical hernia.     Plan    Will refer her back to Dr. Ewing Schlein for a 24-hour pH study, manometry, and gastric emptying scan. If the results of the suggests that she has significant gastroesophageal reflux disease, then I will have her see one of the bariatric surgeons in the practice to discuss bariatric surgery for her obesity and hiatal hernia/GERD.       Nelsie Domino J 04/13/2013, 5:16  PM

## 2013-04-16 ENCOUNTER — Other Ambulatory Visit (HOSPITAL_COMMUNITY): Payer: Self-pay | Admitting: Gastroenterology

## 2013-04-16 DIAGNOSIS — IMO0001 Reserved for inherently not codable concepts without codable children: Secondary | ICD-10-CM

## 2013-04-16 NOTE — Telephone Encounter (Signed)
Notes faxed to [phone]. Confirmation received.

## 2013-04-30 ENCOUNTER — Encounter (HOSPITAL_COMMUNITY)
Admission: RE | Admit: 2013-04-30 | Discharge: 2013-04-30 | Disposition: A | Discharge status: Home / Self Care | Payer: Medicare Other | Source: Ambulatory Visit | Attending: Gastroenterology | Admitting: Gastroenterology

## 2013-04-30 DIAGNOSIS — IMO0001 Reserved for inherently not codable concepts without codable children: Secondary | ICD-10-CM

## 2013-04-30 DIAGNOSIS — E119 Type 2 diabetes mellitus without complications: Secondary | ICD-10-CM | POA: Insufficient documentation

## 2013-04-30 DIAGNOSIS — K219 Gastro-esophageal reflux disease without esophagitis: Secondary | ICD-10-CM | POA: Insufficient documentation

## 2013-04-30 DIAGNOSIS — K449 Diaphragmatic hernia without obstruction or gangrene: Secondary | ICD-10-CM | POA: Insufficient documentation

## 2013-04-30 DIAGNOSIS — R1310 Dysphagia, unspecified: Secondary | ICD-10-CM | POA: Insufficient documentation

## 2013-04-30 MED ORDER — TECHNETIUM TC 99M SULFUR COLLOID
2.2000 | Freq: Once | INTRAVENOUS | Status: AC | PRN
  Administered 2013-04-30: 2.2 via INTRAVENOUS

## 2013-05-03 ENCOUNTER — Ambulatory Visit: Payer: Medicare Other

## 2013-05-10 ENCOUNTER — Encounter (INDEPENDENT_AMBULATORY_CARE_PROVIDER_SITE_OTHER): Payer: Medicare Other | Admitting: Surgery

## 2013-05-23 ENCOUNTER — Ambulatory Visit
Admission: RE | Admit: 2013-05-23 | Discharge: 2013-05-23 | Disposition: A | Discharge status: Home / Self Care | Payer: Medicare Other | Source: Ambulatory Visit | Attending: Gynecology | Admitting: Gynecology

## 2013-05-23 DIAGNOSIS — Z1231 Encounter for screening mammogram for malignant neoplasm of breast: Secondary | ICD-10-CM

## 2013-06-06 ENCOUNTER — Other Ambulatory Visit: Payer: Self-pay

## 2013-06-07 ENCOUNTER — Other Ambulatory Visit: Payer: Self-pay | Admitting: Gastroenterology

## 2013-06-08 NOTE — Addendum Note (Signed)
Addended byVida Rigger on: 06/08/2013 10:54 AM   Modules accepted: Orders

## 2013-06-11 ENCOUNTER — Encounter (HOSPITAL_COMMUNITY)
Admission: RE | Disposition: A | Discharge status: Home / Self Care | Payer: Self-pay | Source: Ambulatory Visit | Attending: Gastroenterology

## 2013-06-11 ENCOUNTER — Ambulatory Visit (HOSPITAL_COMMUNITY)
Admission: RE | Admit: 2013-06-11 | Discharge: 2013-06-11 | Disposition: A | Discharge status: Home / Self Care | Payer: Medicare Other | Source: Ambulatory Visit | Attending: Gastroenterology | Admitting: Gastroenterology

## 2013-06-11 DIAGNOSIS — K219 Gastro-esophageal reflux disease without esophagitis: Secondary | ICD-10-CM | POA: Insufficient documentation

## 2013-06-11 DIAGNOSIS — K224 Dyskinesia of esophagus: Secondary | ICD-10-CM | POA: Insufficient documentation

## 2013-06-11 HISTORY — PX: ESOPHAGEAL MANOMETRY: SHX5429

## 2013-06-11 SURGERY — MANOMETRY, ESOPHAGUS
Anesthesia: Topical

## 2013-06-11 MED ORDER — LIDOCAINE VISCOUS 2 % MT SOLN
OROMUCOSAL | Status: AC
  Filled 2013-06-11: qty 15

## 2013-06-11 SURGICAL SUPPLY — 4 items
DRAPE UTILITY 15X26 W/TAPE STR (DRAPE) ×2 IMPLANT
GLOVE BIOGEL PI IND STRL 8 (GLOVE) ×1 IMPLANT
GLOVE BIOGEL PI INDICATOR 8 (GLOVE) ×1
GOWN PREVENTION PLUS LG XLONG (DISPOSABLE) ×2 IMPLANT

## 2013-06-12 ENCOUNTER — Encounter (HOSPITAL_COMMUNITY): Payer: Self-pay | Admitting: Gastroenterology

## 2013-09-06 ENCOUNTER — Other Ambulatory Visit: Payer: Self-pay

## 2013-11-28 ENCOUNTER — Ambulatory Visit (INDEPENDENT_AMBULATORY_CARE_PROVIDER_SITE_OTHER): Payer: Medicare Other | Admitting: Women's Health

## 2013-11-28 ENCOUNTER — Encounter: Payer: Self-pay | Admitting: Women's Health

## 2013-11-28 DIAGNOSIS — N390 Urinary tract infection, site not specified: Secondary | ICD-10-CM

## 2013-11-28 DIAGNOSIS — N939 Abnormal uterine and vaginal bleeding, unspecified: Secondary | ICD-10-CM

## 2013-11-28 DIAGNOSIS — N899 Noninflammatory disorder of vagina, unspecified: Secondary | ICD-10-CM

## 2013-11-28 DIAGNOSIS — N898 Other specified noninflammatory disorders of vagina: Secondary | ICD-10-CM

## 2013-11-28 LAB — URINALYSIS W MICROSCOPIC + REFLEX CULTURE
BILIRUBIN URINE: NEGATIVE
CASTS: NONE SEEN
CRYSTALS: NONE SEEN
GLUCOSE, UA: NEGATIVE mg/dL
Hgb urine dipstick: NEGATIVE
Ketones, ur: NEGATIVE mg/dL
Nitrite: NEGATIVE
PH: 6.5 (ref 5.0–8.0)
Protein, ur: NEGATIVE mg/dL
RBC / HPF: NONE SEEN RBC/hpf (ref <3)
SPECIFIC GRAVITY, URINE: 1.02 (ref 1.005–1.030)
UROBILINOGEN UA: 0.2 mg/dL (ref 0.0–1.0)

## 2013-11-28 LAB — WET PREP FOR TRICH, YEAST, CLUE
Clue Cells Wet Prep HPF POC: NONE SEEN
Trich, Wet Prep: NONE SEEN
Yeast Wet Prep HPF POC: NONE SEEN

## 2013-11-28 NOTE — Progress Notes (Signed)
Patient ID: Lauren [name], female   DOB: 1946/04/30, 68 y.o.   MRN: [id number] Presents with one-day vaginal spotting 11/28/2013, no bleeding today. Minimal discharge with vaginal irritation. Treated for UTI earlier this month. Denies any visible blood with urination. Denies abdominal pain, fever. Postmenopausal uses Vagifem twice weekly.  Exam: Appears well. External genitalia mild erythema, speculum exam no visible discharge, blood, erythema. Bimanual no CMT and limited exam due to to abdominal girth. Needed UA: Trace leukocytes, 3-6 WBCs, few bacteria, negative blood.  One-day postmenopausal bleeding  Plan: Urine culture pending. Ultrasound to check endometrial stripe.

## 2013-11-28 NOTE — Patient Instructions (Signed)
Postmenopausal Bleeding Postmenopausal bleeding is any bleeding a woman has after she has entered into menopause. Menopause is the end of a woman's fertile years. After menopause, a woman no longer ovulates or has menstrual periods.  Postmenopausal bleeding can be caused by various things. Any type of postmenopausal bleeding, even if it appears to be a typical menstrual period, is concerning. This should be evaluated by your health care provider. Any treatment will depend on the cause of the bleeding. HOME CARE INSTRUCTIONS Monitor your condition for any changes. The following actions may help to alleviate any discomfort you are experiencing:  Avoid the use of tampons and douches as directed by your health care provider.  Change your pads frequently.  Get regular pelvic exams and Pap tests.  Keep all follow-up appointments for diagnostic tests as directed by your health care provider. SEEK MEDICAL CARE IF:   Your bleeding lasts more than 1 week.  You have abdominal pain.  You have bleeding with sexual intercourse. SEEK IMMEDIATE MEDICAL CARE IF:   You have a fever, chills, headache, dizziness, muscle aches, and bleeding.  You have severe pain with bleeding.  You are passing blood clots.  You have bleeding and need more than 1 pad an hour.  You feel faint. MAKE SURE YOU:  Understand these instructions.  Will watch your condition.  Will get help right away if you are not doing well or get worse. Document Released: 01/26/2006 Document Revised: 08/08/2013 Document Reviewed: 05/17/2013 ExitCare Patient Information 2014 [employer].  

## 2013-11-30 ENCOUNTER — Ambulatory Visit: Payer: Medicare Other | Admitting: Women's Health

## 2013-11-30 ENCOUNTER — Other Ambulatory Visit: Payer: Medicare Other

## 2013-11-30 LAB — URINE CULTURE: Colony Count: 10000

## 2013-12-05 ENCOUNTER — Encounter: Payer: Self-pay | Admitting: Women's Health

## 2013-12-05 ENCOUNTER — Ambulatory Visit (INDEPENDENT_AMBULATORY_CARE_PROVIDER_SITE_OTHER): Payer: Medicare Other | Admitting: Women's Health

## 2013-12-05 ENCOUNTER — Other Ambulatory Visit: Payer: Self-pay | Admitting: Women's Health

## 2013-12-05 ENCOUNTER — Ambulatory Visit (INDEPENDENT_AMBULATORY_CARE_PROVIDER_SITE_OTHER): Payer: Medicare Other

## 2013-12-05 DIAGNOSIS — N83339 Acquired atrophy of ovary and fallopian tube, unspecified side: Secondary | ICD-10-CM

## 2013-12-05 DIAGNOSIS — N950 Postmenopausal bleeding: Secondary | ICD-10-CM

## 2013-12-05 DIAGNOSIS — N939 Abnormal uterine and vaginal bleeding, unspecified: Secondary | ICD-10-CM

## 2013-12-05 DIAGNOSIS — D259 Leiomyoma of uterus, unspecified: Secondary | ICD-10-CM

## 2013-12-05 DIAGNOSIS — D251 Intramural leiomyoma of uterus: Secondary | ICD-10-CM

## 2013-12-05 NOTE — Progress Notes (Signed)
Patient ID: Lauren [name], female   DOB: 1946/08/22, 68 y.o.   MRN: [id number] Presents for ultrasound. Had 2 episodes of one day light vaginal bleeding. UA was negative at time of last vaginal bleeding, no blood noted in vaginal vault. Postmenopausal/no HRT.   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 Right side 12 x 12 x 13 mm, 10 x 8 mm displaces the endometrial cavity. Right ovary not seen excessive bowel, left ovary atrophic. Negative cul-de-sac. Endometrium 3.7 mm.  Postmenopausal spotting with thin endometrium  Plan: Instructed to call if any further bleeding, reviewed sonohysterogram with endometrial biopsy would be next step. Keep scheduled annual exam appointment.

## 2014-01-29 ENCOUNTER — Encounter: Payer: Medicare Other | Admitting: Women's Health

## 2014-02-26 ENCOUNTER — Other Ambulatory Visit (HOSPITAL_COMMUNITY)
Admission: RE | Admit: 2014-02-26 | Discharge: 2014-02-26 | Disposition: A | Discharge status: Home / Self Care | Payer: Medicare Other | Source: Ambulatory Visit | Attending: Gynecology | Admitting: Gynecology

## 2014-02-26 ENCOUNTER — Encounter: Payer: Self-pay | Admitting: Women's Health

## 2014-02-26 ENCOUNTER — Ambulatory Visit (INDEPENDENT_AMBULATORY_CARE_PROVIDER_SITE_OTHER): Payer: Medicare Other | Admitting: Women's Health

## 2014-02-26 VITALS — BP 124/76 | Ht 64.25 in | Wt 224.0 lb

## 2014-02-26 DIAGNOSIS — Z780 Asymptomatic menopausal state: Secondary | ICD-10-CM

## 2014-02-26 DIAGNOSIS — Z124 Encounter for screening for malignant neoplasm of cervix: Secondary | ICD-10-CM

## 2014-02-26 MED ORDER — ESTRADIOL 10 MCG VA TABS
10.0000 ug | ORAL_TABLET | VAGINAL | Status: DC
Start: 2014-02-26 — End: 2015-09-12

## 2014-02-26 MED ORDER — ESTRADIOL 10 MCG VA TABS: 10.0000 ug | ORAL_TABLET | VAGINAL | Status: DC

## 2014-02-26 NOTE — Progress Notes (Signed)
Lauren [name] [birth year] [id number]  History:    Presents for breast and pelvic exam. Postmenopausal onVagifem. Postmenopausal bleeding with an endometrial stripe at 3.7 12/2013, no bleeding since.   Normal Paps.  Benign breast biopsies in the past, normal mammograms after. 2013 DEXA T score -0.3 both hip and spine. Negative colonoscopy 2012. Has not had Zostavac but has had Pneumovax, allergic to tetnus. Hypertension/diabetes/GERD/colitis-primary care manages.  Past medical history, past surgical history, family history and social history were all reviewed and documented in the EPIC chart. Retired from [employer]. Parents hypertension and diabetes, father died of colon cancer. Working on selling home to son and refinishing parents old home.  ROS:  A  12 POINT ROS was performed and pertinent positives and negatives are included.  Exam:  Filed Vitals:   02/26/14 1020  BP: 124/76    General appearance:  Normal Thyroid:  Symmetrical, normal in size, without palpable masses or nodularity. Respiratory  Auscultation:  Clear without wheezing or rhonchi Cardiovascular  Auscultation:  Regular rate, without rubs, murmurs or gallops  Edema/varicosities:  Not grossly evident Abdominal  Soft,nontender, without masses, guarding or rebound.  Liver/spleen:  No organomegaly noted  Hernia: Small umbilical asymptomatic  Skin  Inspection:  Grossly normal   Breasts: Examined lying and sitting.     Right: Without masses, retractions, discharge or axillary adenopathy.     Left: Without masses, retractions, discharge or axillary adenopathy. Gentitourinary   Inguinal/mons:  Normal without inguinal adenopathy  External genitalia:  Normal  BUS/Urethra/Skene's glands:  Normal  Vagina:  Normal  Cervix:  Normal  Uterus:  normal in size, shape and contour.  Midline and mobile  Adnexa/parametria:     Rt: Without masses or tenderness.   Lt: Without masses or tenderness.  Anus and perineum: Normal  Digital  rectal exam: Normal sphincter tone without palpated masses or tenderness  Assessment/Plan:  68 y.o. MWF G2P2  for breast and pelvic exam.  Atrophic vaginitis Hypertension/diabetes/GERD/colitis-primary care manages labs and meds Normal DEXA  Plan: Repeat DEXA next year. Vagifem prescription, proper use, reviewed minimal systemic absorption will continue twice weekly. Instructed to call if any further vaginal bleeding. SBE's, continue annual mammogram, calcium rich diet, vitamin D 2000 daily. Reviewed importance of increasing regular exercise and decreasing calories for weight loss. Zostavac recommended. Pap, Pap normal 2013, new screening guidelines reviewed.    [place], 11:19 AM 02/26/2014

## 2014-02-26 NOTE — Patient Instructions (Signed)
Health Recommendations for Postmenopausal Women Respected and ongoing research has looked at the most common causes of death, disability, and poor quality of life in postmenopausal women. The causes include heart disease, diseases of blood vessels, diabetes, depression, cancer, and bone loss (osteoporosis). Many things can be done to help lower the chances of developing these and other common problems: CARDIOVASCULAR DISEASE Heart Disease: A heart attack is a medical emergency. Know the signs and symptoms of a heart attack. Below are things women can do to reduce their risk for heart disease.   Do not smoke. If you smoke, quit.  Aim for a healthy weight. Being overweight causes many preventable deaths. Eat a healthy and balanced diet and drink an adequate amount of liquids.  Get moving. Make a commitment to be more physically active. Aim for 30 minutes of activity on most, if not all days of the week.  Eat for heart health. Choose a diet that is low in saturated fat and cholesterol and eliminate trans fat. Include whole grains, vegetables, and fruits. Read and understand the labels on food containers before buying.  Know your numbers. Ask your caregiver to check your blood pressure, cholesterol (total, HDL, LDL, triglycerides) and blood glucose. Work with your caregiver on improving your entire clinical picture.  High blood pressure. Limit or stop your table salt intake (try salt substitute and food seasonings). Avoid salty foods and drinks. Read labels on food containers before buying. Eating well and exercising can help control high blood pressure. STROKE  Stroke is a medical emergency. Stroke may be the result of a blood clot in a blood vessel in the brain or by a brain hemorrhage (bleeding). Know the signs and symptoms of a stroke. To lower the risk of developing a stroke:  Avoid fatty foods.  Quit smoking.  Control your diabetes, blood pressure, and irregular heart rate. THROMBOPHLEBITIS  (BLOOD CLOT) OF THE LEG  Becoming overweight and leading a stationary lifestyle may also contribute to developing blood clots. Controlling your diet and exercising will help lower the risk of developing blood clots. CANCER SCREENING  Breast Cancer: Take steps to reduce your risk of breast cancer.  You should practice "breast self-awareness." This means understanding the normal appearance and feel of your breasts and should include breast self-examination. Any changes detected, no matter how small, should be reported to your caregiver.  After age 40, you should have a clinical breast exam (CBE) every year.  Starting at age 40, you should consider having a mammogram (breast X-ray) every year.  If you have a family history of breast cancer, talk to your caregiver about genetic screening.  If you are at high risk for breast cancer, talk to your caregiver about having an MRI and a mammogram every year.  Intestinal or Stomach Cancer: Tests to consider are a rectal exam, fecal occult blood, sigmoidoscopy, and colonoscopy. Women who are high risk may need to be screened at an earlier age and more often.  Cervical Cancer:  Beginning at age 30, you should have a Pap test every 3 years as long as the past 3 Pap tests have been normal.  If you have had past treatment for cervical cancer or a condition that could lead to cancer, you need Pap tests and screening for cancer for at least 20 years after your treatment.  If you had a hysterectomy for a problem that was not cancer or a condition that could lead to cancer, then you no longer need Pap tests.    If you are between ages 65 and 70, and you have had normal Pap tests going back 10 years, you no longer need Pap tests.  If Pap tests have been discontinued, risk factors (such as a new sexual partner) need to be reassessed to determine if screening should be resumed.  Some medical problems can increase the chance of getting cervical cancer. In these  cases, your caregiver may recommend more frequent screening and Pap tests.  Uterine Cancer: If you have vaginal bleeding after reaching menopause, you should notify your caregiver.  Ovarian cancer: Other than yearly pelvic exams, there are no reliable tests available to screen for ovarian cancer at this time except for yearly pelvic exams.  Lung Cancer: Yearly chest X-rays can detect lung cancer and should be done on high risk women, such as cigarette smokers and women with chronic lung disease (emphysema).  Skin Cancer: A complete body skin exam should be done at your yearly examination. Avoid overexposure to the sun and ultraviolet light lamps. Use a strong sun block cream when in the sun. All of these things are important in lowering the risk of skin cancer. MENOPAUSE Menopause Symptoms: Hormone therapy products are effective for treating symptoms associated with menopause:  Moderate to severe hot flashes.  Night sweats.  Mood swings.  Headaches.  Tiredness.  Loss of sex drive.  Insomnia.  Other symptoms. Hormone replacement carries certain risks, especially in older women. Women who use or are thinking about using estrogen or estrogen with progestin treatments should discuss that with their caregiver. Your caregiver will help you understand the benefits and risks. The ideal dose of hormone replacement therapy is not known. The Food and Drug Administration (FDA) has concluded that hormone therapy should be used only at the lowest doses and for the shortest amount of time to reach treatment goals.  OSTEOPOROSIS Protecting Against Bone Loss and Preventing Fracture: If you use hormone therapy for prevention of bone loss (osteoporosis), the risks for bone loss must outweigh the risk of the therapy. Ask your caregiver about other medications known to be safe and effective for preventing bone loss and fractures. To guard against bone loss or fractures, the following is recommended:  If  you are less than age 50, take 1000 mg of calcium and at least 600 mg of Vitamin D per day.  If you are greater than age 50 but less than age 70, take 1200 mg of calcium and at least 600 mg of Vitamin D per day.  If you are greater than age 70, take 1200 mg of calcium and at least 800 mg of Vitamin D per day. Smoking and excessive alcohol intake increases the risk of osteoporosis. Eat foods rich in calcium and vitamin D and do weight bearing exercises several times a week as your caregiver suggests. DIABETES Diabetes Melitus: If you have Type I or Type 2 diabetes, you should keep your blood sugar under control with diet, exercise and recommended medication. Avoid too many sweets, starchy and fatty foods. Being overweight can make control more difficult. COGNITION AND MEMORY Cognition and Memory: Menopausal hormone therapy is not recommended for the prevention of cognitive disorders such as Alzheimer's disease or memory loss.  DEPRESSION  Depression may occur at any age, but is common in elderly women. The reasons may be because of physical, medical, social (loneliness), or financial problems and needs. If you are experiencing depression because of medical problems and control of symptoms, talk to your caregiver about this. Physical activity and   exercise may help with mood and sleep. Community and volunteer involvement may help your sense of value and worth. If you have depression and you feel that the problem is getting worse or becoming severe, talk to your caregiver about treatment options that are best for you. ACCIDENTS  Accidents are common and can be serious in the elderly woman. Prepare your house to prevent accidents. Eliminate throw rugs, place hand bars in the bath, shower and toilet areas. Avoid wearing high heeled shoes or walking on wet, snowy, and icy areas. Limit or stop driving if you have vision or hearing problems, or you feel you are unsteady with you movements and  reflexes. HEPATITIS C Hepatitis C is a type of viral infection affecting the liver. It is spread mainly through contact with blood from an infected person. It can be treated, but if left untreated, it can lead to severe liver damage over years. Many people who are infected do not know that the virus is in their blood. If you are a "baby-boomer", it is recommended that you have one screening test for Hepatitis C. IMMUNIZATIONS  Several immunizations are important to consider having during your senior years, including:   Tetanus, diptheria, and pertussis booster shot.  Influenza every year before the flu season begins.  Pneumonia vaccine.  Shingles vaccine.  Others as indicated based on your specific needs. Talk to your caregiver about these. Document Released: 12/10/2005 Document Revised: 10/04/2012 Document Reviewed: 08/05/2008 ExitCare Patient Information 2014 [employer].  

## 2014-06-13 ENCOUNTER — Other Ambulatory Visit: Payer: Self-pay

## 2014-06-13 DIAGNOSIS — Z1231 Encounter for screening mammogram for malignant neoplasm of breast: Secondary | ICD-10-CM

## 2014-07-11 ENCOUNTER — Ambulatory Visit: Payer: Medicare Other

## 2014-07-18 ENCOUNTER — Ambulatory Visit: Payer: Medicare Other

## 2014-07-30 ENCOUNTER — Telehealth: Payer: Self-pay | Admitting: *Deleted

## 2014-07-30 NOTE — Telephone Encounter (Signed)
(  pt aware you are out of the office) Pt called c/o vagifem is too expensive for her at this point. Pt has some left to use for now, asked if there is anything OTC she can use that is similar to vagifem? Please advise

## 2014-08-02 NOTE — Telephone Encounter (Signed)
Pt informed with the below note. 

## 2014-08-02 NOTE — Telephone Encounter (Signed)
Vagifem has least amt of estrogen systemically absorbed.  Other vag estrogens more widespread absorption which increases the risk.  Estring vaginal ring also minimal absorption but also expensive.  Could try an injectable  Vaginal lubricant like refresh it may help with the dryness.

## 2014-08-08 ENCOUNTER — Ambulatory Visit
Admission: RE | Admit: 2014-08-08 | Discharge: 2014-08-08 | Disposition: A | Discharge status: Home / Self Care | Payer: Medicare Other | Source: Ambulatory Visit

## 2014-08-08 DIAGNOSIS — Z1231 Encounter for screening mammogram for malignant neoplasm of breast: Secondary | ICD-10-CM

## 2014-09-02 ENCOUNTER — Encounter: Payer: Self-pay | Admitting: Women's Health

## 2014-11-04 ENCOUNTER — Other Ambulatory Visit: Payer: Self-pay | Admitting: Specialist

## 2014-11-04 DIAGNOSIS — M5416 Radiculopathy, lumbar region: Secondary | ICD-10-CM

## 2015-02-06 ENCOUNTER — Other Ambulatory Visit: Payer: Self-pay | Admitting: Specialist

## 2015-02-06 DIAGNOSIS — G8929 Other chronic pain: Secondary | ICD-10-CM

## 2015-02-06 DIAGNOSIS — M545 Low back pain, unspecified: Secondary | ICD-10-CM

## 2015-02-06 DIAGNOSIS — M79606 Pain in leg, unspecified: Secondary | ICD-10-CM

## 2015-02-06 DIAGNOSIS — M47816 Spondylosis without myelopathy or radiculopathy, lumbar region: Secondary | ICD-10-CM

## 2015-02-19 ENCOUNTER — Ambulatory Visit
Admission: RE | Admit: 2015-02-19 | Discharge: 2015-02-19 | Disposition: A | Discharge status: Home / Self Care | Payer: Medicare Other | Source: Ambulatory Visit | Attending: Specialist | Admitting: Specialist

## 2015-02-19 DIAGNOSIS — M47816 Spondylosis without myelopathy or radiculopathy, lumbar region: Secondary | ICD-10-CM

## 2015-02-19 DIAGNOSIS — M545 Low back pain, unspecified: Secondary | ICD-10-CM

## 2015-02-19 DIAGNOSIS — M79606 Pain in leg, unspecified: Secondary | ICD-10-CM

## 2015-02-19 DIAGNOSIS — G8929 Other chronic pain: Secondary | ICD-10-CM

## 2015-02-19 MED ORDER — DIAZEPAM 5 MG PO TABS
5.0000 mg | ORAL_TABLET | Freq: Once | ORAL | Status: AC
  Administered 2015-02-19: 5 mg via ORAL

## 2015-02-19 MED ORDER — IOHEXOL 180 MG/ML  SOLN
15.0000 mL | Freq: Once | INTRAMUSCULAR | Status: AC | PRN
  Administered 2015-02-19: 15 mL via INTRATHECAL

## 2015-02-19 MED ORDER — ONDANSETRON HCL 4 MG/2ML IJ SOLN: 4.0000 mg | Freq: Four times a day (QID) | INTRAMUSCULAR | Status: DC | PRN

## 2015-02-19 MED ORDER — HYDROCODONE-ACETAMINOPHEN 5-325 MG PO TABS
1.0000 | ORAL_TABLET | Freq: Once | ORAL | Status: AC
  Administered 2015-02-19: 1 via ORAL

## 2015-02-19 NOTE — Progress Notes (Signed)
Pt states she has been off Tramadol for the past 2 days. Discharge instructions explained to pt.

## 2015-02-19 NOTE — Discharge Instructions (Signed)
Myelogram Discharge Instructions  1. Go home and rest quietly for the next 24 hours.  It is important to lie flat for the next 24 hours.  Get up only to go to the restroom.  You may lie in the bed or on a couch on your back, your stomach, your left side or your right side.  You may have one pillow under your head.  You may have pillows between your knees while you are on your side or under your knees while you are on your back.  2. DO NOT drive today.  Recline the seat as far back as it will go, while still wearing your seat belt, on the way home.  3. You may get up to go to the bathroom as needed.  You may sit up for 10 minutes to eat.  You may resume your normal diet and medications unless otherwise indicated.  Drink lots of extra fluids today and tomorrow.  4. The incidence of headache, nausea, or vomiting is about 5% (one in 20 patients).  If you develop a headache, lie flat and drink plenty of fluids until the headache goes away.  Caffeinated beverages may be helpful.  If you develop severe nausea and vomiting or a headache that does not go away with flat bed rest, call [phone].  5. You may resume normal activities after your 24 hours of bed rest is over; however, do not exert yourself strongly or do any heavy lifting tomorrow. If when you get up you have a headache when standing, go back to bed and force fluids for another 24 hours.  6. Call your physician for a follow-up appointment.  The results of your myelogram will be sent directly to your physician by the following day.  7. If you have any questions or if complications develop after you arrive home, please call [phone].  Discharge instructions have been explained to the patient.  The patient, or the person responsible for the patient, fully understands these instructions.       May resume Tramadol on February 20, 2015, after 8:30 am.

## 2015-04-23 DIAGNOSIS — M5137 Other intervertebral disc degeneration, lumbosacral region: Secondary | ICD-10-CM | POA: Insufficient documentation

## 2015-04-23 DIAGNOSIS — M545 Low back pain, unspecified: Secondary | ICD-10-CM | POA: Insufficient documentation

## 2015-05-13 ENCOUNTER — Ambulatory Visit: Payer: Medicare Other | Attending: Specialist | Admitting: Physical Therapy

## 2015-05-13 DIAGNOSIS — M6281 Muscle weakness (generalized): Secondary | ICD-10-CM | POA: Insufficient documentation

## 2015-05-13 DIAGNOSIS — R29898 Other symptoms and signs involving the musculoskeletal system: Secondary | ICD-10-CM

## 2015-05-13 DIAGNOSIS — M545 Low back pain, unspecified: Secondary | ICD-10-CM

## 2015-05-13 DIAGNOSIS — R262 Difficulty in walking, not elsewhere classified: Secondary | ICD-10-CM

## 2015-05-13 DIAGNOSIS — M25551 Pain in right hip: Secondary | ICD-10-CM

## 2015-05-13 NOTE — Patient Instructions (Signed)
Knee to Chest   Lying supine, bend involved knee to chest __3-5_ times. Repeat with other leg. Do _2-3__ times per day.  Copyright  VHI. All rights reserved.    PELVIC STABILIZATION: Pelvic Tilt (Lying)   Exhaling, pull belly toward spine, tilting pelvis forward. Inhaling, release.  Copyright  VHI. All rights reserved.   Repeat ___10 times. Do _2-3__ times per day.    Lower Trunk Rotation Stretch      Keeping back flat and feet together, rotate knees to left side. Hold __5-10__ seconds. Repeat __10__ times per set. Do ___2_ sets per session. Do ___2-3_ sessions per day. CAN ALSO TRY WITH KNEES APART [email or web address]

## 2015-05-14 NOTE — Therapy (Signed)
[place], [place], [address] Phone: [phone]   Fax:  [phone]  Physical Therapy Evaluation  Patient Details  Name: Lauren [name] MRN: [id number] Date of Birth: [birth year] Referring Provider:  Jessy Oto, MD  Encounter Date: 05/13/2015      PT End of Session - 05/14/15 0828    Visit Number 1   Number of Visits 16   Date for PT Re-Evaluation 07/08/15   PT Start Time 5277   PT Stop Time 1645   PT Time Calculation (min) 60 min   Activity Tolerance Patient limited by pain   Behavior During Therapy [facility] for tasks assessed/performed      Past Medical History  Diagnosis Date  . GERD (gastroesophageal reflux disease)   . Neuropathy   . Arthritis   . Menopause   . History of kidney stones   . Hypertension   . Endometriosis   . Insomnia   . Vitamin D deficiency   . Umbilical hernia   . Diabetes mellitus   . History of ulcerative colitis     Past Surgical History  Procedure Laterality Date  . Colon surgery    . Wrist surgery      both  . Myomectomy    . Kidney stone removed  2/11  . Right colectomy    . Tendon release    . Right ureteroscopic stone extraction  12-11-2009  DR [facility]  . Left thumb carpometacarpal joint suspensionplasty  04-06-2006  DR Burney Gauze  . Excision right breast mass   10-20-2005  DR Collier [facility]  . Left wrist arthroscopy w/ debridement and removal cyst  03-26-2004  DR Burney Gauze  . Cholecystectomy  1992  . Breast biopsy  08-13-2009  DR TSUEI    EXCISION LEFT NIPPLE DUCT  . Colon resection  1986    ULCERATIVE COLITIS  . Esophageal manometry N/A 06/11/2013    Procedure: ESOPHAGEAL MANOMETRY (EM);  Surgeon: Jeryl Columbia, MD;  Location: [facility];  Service: Endoscopy;  Laterality: N/A;    There were no vitals filed for this visit.  Visit Diagnosis:  Right-sided low back pain without sciatica  Pain in hip joint, right  Difficulty walking  Weakness  of back      Subjective Assessment - 05/13/15 1555    Subjective Patient presents with chronic pain in R low back, R hip pain, difficulty walking, sit to stand.  She feels like her back won't support her.  Denies LE weakness, sensory sx.   Injection in hip did not help pain. Ablation on L side has given her relief (March 2015).     Pertinent History diabetes   Limitations Standing;Walking;Lifting;House hold activities;Sitting   How long can you sit comfortably? short periods in standard   How long can you stand comfortably? 5-10 min    How long can you walk comfortably? goes to grocery store 20-30 min, pain incr to severe (7/10)   Currently in Pain? Yes   Pain Score 4    Pain Location Back   Pain Orientation Right   Pain Descriptors / Indicators Sharp;Dull   Pain Type Chronic pain   Pain Radiating Towards Rt. hip , rarely below knee   Pain Onset More than a month ago   Pain Frequency Intermittent   Aggravating Factors  everything   Pain Relieving Factors changing positions, ice pack, pain meds   Effect of Pain on Daily Activities cannot do normal activity, enjoys cooking, being outside  Multiple Pain Sites No            [facility] PT Assessment - 2015-06-06 1605    Assessment   Medical Diagnosis L4-L5-S1 spondylolisthesis   Onset Date/Surgical Date --  Dec. 2015   Prior Therapy Yes, long ago   Precautions   Precautions Other (comment)   Precaution Comments liftin <25lbs   Restrictions   Weight Bearing Restrictions No   Balance Screen   Has the patient fallen in the past 6 months No   [place] residence   Prior Function   Level of Independence Independent with household mobility without device;Needs assistance with ADLs;Needs assistance with homemaking;Needs assistance with transfers   Vocation Retired   [employer] worked at [employer] improvement   Leisure outdoors, cooking   Comments reports prior begin very active     Cognition   Overall Cognitive Status Within Functional Limits for tasks assessed   Sensation   Light Touch Appears Intact   Coordination   Gross Motor Movements are Fluid and Coordinated Not tested   Posture/Postural Control   Posture/Postural Control Postural limitations   Postural Limitations Rounded Shoulders;Forward head;Decreased lumbar lordosis;Flexed trunk   AROM   Lumbar Flexion 25% pain  walks up thighs using hands   Lumbar Extension 50% or more pain    Lumbar - Right Side Bend pain, 25%   Lumbar - Left Side Bend no pain 25%   Lumbar - Right Rotation 25%  no pain    Lumbar - Left Rotation 25% min pain    Strength   Right Hip Flexion 3-/5   Right Hip ABduction 2+/5   Left Hip Flexion 4/5   Left Hip ABduction 3/5   Right Knee Flexion 4+/5   Right Knee Extension 4/5   Left Knee Flexion 4+/5   Left Knee Extension 4+/5   Right Ankle Dorsiflexion 5/5   Left Ankle Dorsiflexion 5/5   Palpation   Spinal mobility hypomobile thoracic and lumbar L1-L, pain    SI assessment  pain lateral border SIJ bilat.    Palpation comment pain, sore throughout L spine Rt>L. no pain in gr troch/gluteals   Special Tests    Special Tests Lumbar   Lumbar Tests Straight Leg Raise   Straight Leg Raise   Findings Negative   Side  Left  Rt   Bed Mobility   Bed Mobility Sit to Supine   Sit to Supine 4: Min guard  spouse provides assist, brings legs up into long sit, pain   Transfers   Transfers Sit to Stand   Sit to Stand 4: Min guard  by souse, able to do with supervision only   Ambulation/Gait   Ambulation/Gait Yes   Ambulation/Gait Assistance 4: Min guard   Ambulation Distance (Feet) 150 Feet   Assistive device None   Gait Pattern Decreased step length - right;Decreased step length - left;Decreased weight shift to right;Trunk flexed   Ambulation Surface Level;Indoor               G-Codes - 06-07-15 0836    Functional Assessment Tool Used --     06-Jun-2015  G-CODES (FOTO  and clin. Judgement) Category: mobility Current CM 80-100% Goal CJ 20-40%           PT Education - 06/07/15 0827    Education provided Yes   Education Details PT/POC, HEP and spondylolisthesis, IFC/Ice   Person(s) Educated Patient;Spouse   Methods Demonstration;Handout;Explanation   Comprehension Verbalized understanding;Need  further instruction          PT Short Term Goals - 05/14/15 9767    PT SHORT TERM GOAL #1   Title Patient will be able to demo initial HEP I    Time 4   Period Weeks   Status New   PT SHORT TERM GOAL #2   Title Pt will be able to get onto mat table safely in gym and bed without  A    Time 4   Period Weeks   Status New   PT SHORT TERM GOAL #3   Title Pt will be able to stand to wash dishes, light housework  5-10 min at home with 25% less difficulty, pain.   Time 4   Period Weeks   Status New   PT SHORT TERM GOAL #4   Title Pt will be able to report 25% less Rt. LE pain with daily activities.    Time 4   Period Weeks   Status New           PT Long Term Goals - 05/14/15 3419    PT LONG TERM GOAL #1   Title Pt will be I with more advanced HEP as of last visit.    Time 8   Period Weeks   Status New   PT LONG TERM GOAL #2   Title Pt will understand posture, body mechanics as it relates to her diagnosis and symptoms.    Time 8   Period Weeks   Status New   PT LONG TERM GOAL #3   Title Pt will be able to sit to stand, change positions with min occasional difficulty.    Time 8   Period Weeks   Status New   PT LONG TERM GOAL #4   Title Pt will be able to walk in the community for 20-30 min and mod increase in pain   Time 8   Period Weeks   Status New   PT LONG TERM GOAL #5   Title Pt will demo hip strength (Rt.) to 4/5 throughout for improved functional mobility.    Time 8   Period Weeks   Status New   Additional Long Term Goals   Additional Long Term Goals Yes   PT LONG TERM GOAL #6   Title Pt will score 45% or less on FOTO  to demo improvement in function.   Time 8   Period Weeks   Status New               Plan - 05/14/15 3790    Clinical Impression Statement This patient presents with significant pain and difficulty walking due to low back pain, Rt hip pain.  She has hip/core weakness, decr flexibility, and overall poor activity tolerance.  She reports being referred to Neurosurgery at one point, but MD wants to give PT  and pain mgmt. a try.  She will likely not be able to attend 2 x per week due to high co-pay.  She will benefit from skilled PT to improve her mobility and quality of life.    Pt will benefit from skilled therapeutic intervention in order to improve on the following deficits Abnormal gait;Decreased range of motion;Difficulty walking;Increased fascial restricitons;Obesity;Decreased endurance;Decreased activity tolerance;Pain;Decreased balance;Impaired flexibility;Improper body mechanics;Postural dysfunction;Decreased strength;Decreased mobility   Rehab Potential Good   PT Frequency 2x / week   PT Duration 8 weeks   PT Treatment/Interventions ADLs/Self Care Home Management;Cryotherapy;Electrical Stimulation;Gait training;DME Instruction;Ultrasound;Moist Heat;Iontophoresis 4mg /ml Dexamethasone;Functional mobility training;Therapeutic activities;Therapeutic  exercise;Balance training;Neuromuscular re-education;Manual techniques;Patient/family education;Passive range of motion;Dry needling;Taping   PT Next Visit Plan check HEP and assess affect of IFC, consider home TENS (written on referral), pt to check on insurance coverage   PT Home Exercise Plan flexion based stretching   Consulted and Agree with Plan of Care Patient;Family member/caregiver   Family Member Consulted spouse          G-Codes - 05-29-15 2536    Functional Assessment Tool Used --       Problem List Patient Active Problem List   Diagnosis Date Noted  . Hiatal hernia 04/13/2013  . Type II or unspecified type diabetes  mellitus without mention of complication, uncontrolled 01/26/2013  . Umbilical hernia 64/40/3474  . Hypertension     PAA,JENNIFER 05-29-2015, 9:58 AM  [place], [place], [address] Phone: [phone]   Fax:  [phone]   Raeford Razor, PT 05/29/2015 10:01 AM Phone: [phone] Fax: [phone]

## 2015-05-15 ENCOUNTER — Ambulatory Visit: Payer: Medicare Other | Admitting: Physical Therapy

## 2015-05-15 DIAGNOSIS — M545 Low back pain, unspecified: Secondary | ICD-10-CM

## 2015-05-15 DIAGNOSIS — M25551 Pain in right hip: Secondary | ICD-10-CM

## 2015-05-15 DIAGNOSIS — R262 Difficulty in walking, not elsewhere classified: Secondary | ICD-10-CM

## 2015-05-15 DIAGNOSIS — R29898 Other symptoms and signs involving the musculoskeletal system: Secondary | ICD-10-CM

## 2015-05-15 NOTE — Therapy (Signed)
[place], [place], [address] Phone: [phone]   Fax:  [phone]  Physical Therapy Treatment  Patient Details  Name: Lauren [name] MRN: [id number] Date of Birth: 05/18/1946 Referring Provider:  Lona Kettle, MD  Encounter Date: 05/15/2015      PT End of Session - 05/15/15 1521    Visit Number 2   Number of Visits 16   PT Start Time 1506      Past Medical History  Diagnosis Date  . GERD (gastroesophageal reflux disease)   . Neuropathy   . Arthritis   . Menopause   . History of kidney stones   . Hypertension   . Endometriosis   . Insomnia   . Vitamin D deficiency   . Umbilical hernia   . Diabetes mellitus   . History of ulcerative colitis     Past Surgical History  Procedure Laterality Date  . Colon surgery    . Wrist surgery      both  . Myomectomy    . Kidney stone removed  2/11  . Right colectomy    . Tendon release    . Right ureteroscopic stone extraction  12-11-2009  DR [facility]  . Left thumb carpometacarpal joint suspensionplasty  04-06-2006  DR Burney Gauze  . Excision right breast mass   10-20-2005  DR Collier [facility]  . Left wrist arthroscopy w/ debridement and removal cyst  03-26-2004  DR Burney Gauze  . Cholecystectomy  1992  . Breast biopsy  08-13-2009  DR TSUEI    EXCISION LEFT NIPPLE DUCT  . Colon resection  1986    ULCERATIVE COLITIS  . Esophageal manometry N/A 06/11/2013    Procedure: ESOPHAGEAL MANOMETRY (EM);  Surgeon: Jeryl Columbia, MD;  Location: [facility];  Service: Endoscopy;  Laterality: N/A;    There were no vitals filed for this visit.  Visit Diagnosis:  Right-sided low back pain without sciatica  Pain in hip joint, right  Difficulty walking  Weakness of back      Subjective Assessment - 05/15/15 1510    Subjective Had to take a pain pill after the eval the other day  Didn't really care for the E-stim.     Currently in Pain? Yes   Pain Score 4                           [facility] Adult PT Treatment/Exercise - 05/15/15 1514    Lumbar Exercises: Stretches   Active Hamstring Stretch 3 reps;30 seconds   Single Knee to Chest Stretch 3 reps;30 seconds   Single Knee to Chest Stretch Limitations unable to tolerate on Rt. side due to Rt. hip pain    Lower Trunk Rotation 5 reps   Lower Trunk Rotation Limitations x 10    Pelvic Tilt 5 reps  x 10 neutral to flex   Lumbar Exercises: Supine   Ab Set 10 reps   Clam 10 reps   Clam Limitations 2 sets, 1 x 10 unilateral and x 10 bilat.    Heel Slides --   Bent Knee Raise --   Moist Heat Therapy   Number Minutes Moist Heat 15 Minutes   Moist Heat Location Lumbar Spine   Manual Therapy   Manual Therapy Soft tissue mobilization;Myofascial release   Manual therapy comments used Rock blade for part of treament   Soft tissue mobilization Rt. QL and L paraspinals, into Rt. hip   Myofascial  Release same areas, stretch to Rt. trunk with R UE reaching overhead                PT Education - 05/15/15 1520    Education provided Yes   Education Details HEP   Person(s) Educated Patient;Spouse   Methods Explanation;Demonstration;Tactile cues;Verbal cues   Comprehension Verbalized understanding;Returned demonstration          PT Short Term Goals - 05/14/15 7001    PT SHORT TERM GOAL #1   Title Patient will be able to demo initial HEP I    Time 4   Period Weeks   Status New   PT SHORT TERM GOAL #2   Title Pt will be able to get onto mat table safely in gym and bed without  A    Time 4   Period Weeks   Status New   PT SHORT TERM GOAL #3   Title Pt will be able to stand to wash dishes, light housework  5-10 min at home with 25% less difficulty, pain.   Time 4   Period Weeks   Status New   PT SHORT TERM GOAL #4   Title Pt will be able to report 25% less Rt. LE pain with daily activities.    Time 4   Period Weeks   Status New           PT Long Term Goals -  05/14/15 7494    PT LONG TERM GOAL #1   Title Pt will be I with more advanced HEP as of last visit.    Time 8   Period Weeks   Status New   PT LONG TERM GOAL #2   Title Pt will understand posture, body mechanics as it relates to her diagnosis and symptoms.    Time 8   Period Weeks   Status New   PT LONG TERM GOAL #3   Title Pt will be able to sit to stand, change positions with min occasional difficulty.    Time 8   Period Weeks   Status New   PT LONG TERM GOAL #4   Title Pt will be able to walk in the community for 20-30 min and mod increase in pain   Time 8   Period Weeks   Status New   PT LONG TERM GOAL #5   Title Pt will demo hip strength (Rt.) to 4/5 throughout for improved functional mobility.    Time 8   Period Weeks   Status New   Additional Long Term Goals   Additional Long Term Goals Yes   PT LONG TERM GOAL #6   Title Pt will score 45% or less on FOTO to demo improvement in function.   Time 8   Period Weeks   Status New               Problem List Patient Active Problem List   Diagnosis Date Noted  . Hiatal hernia 04/13/2013  . Type II or unspecified type diabetes mellitus without mention of complication, uncontrolled 01/26/2013  . Umbilical hernia 49/67/5916  . Hypertension     PAA,JENNIFER 05/15/2015, 4:40 PM  [place], [place], [address] Phone: [phone]   Fax:  [phone]    Raeford Razor, PT 05/15/2015 4:40 PM Phone: [phone] Fax: [phone]

## 2015-05-20 ENCOUNTER — Ambulatory Visit: Payer: Medicare Other | Admitting: Physical Therapy

## 2015-05-20 DIAGNOSIS — R29898 Other symptoms and signs involving the musculoskeletal system: Secondary | ICD-10-CM

## 2015-05-20 DIAGNOSIS — M545 Low back pain: Secondary | ICD-10-CM | POA: Diagnosis not present

## 2015-05-20 NOTE — Therapy (Signed)
[place], [place], [address] Phone: [phone]   Fax:  [phone]  Physical Therapy Treatment  Patient Details  Name: Lauren [name] MRN: [id number] Date of Birth: 1946/09/28 Referring Provider:  Lona Kettle, MD  Encounter Date: 05/20/2015      PT End of Session - 05/20/15 1323    Visit Number 3   Number of Visits 16   Date for PT Re-Evaluation 07/08/15   PT Start Time 0950   PT Stop Time 1015   PT Time Calculation (min) 25 min   Activity Tolerance Patient tolerated treatment well;Patient limited by pain   Behavior During Therapy [facility] for tasks assessed/performed      Past Medical History  Diagnosis Date  . GERD (gastroesophageal reflux disease)   . Neuropathy   . Arthritis   . Menopause   . History of kidney stones   . Hypertension   . Endometriosis   . Insomnia   . Vitamin D deficiency   . Umbilical hernia   . Diabetes mellitus   . History of ulcerative colitis     Past Surgical History  Procedure Laterality Date  . Colon surgery    . Wrist surgery      both  . Myomectomy    . Kidney stone removed  2/11  . Right colectomy    . Tendon release    . Right ureteroscopic stone extraction  12-11-2009  DR [facility]  . Left thumb carpometacarpal joint suspensionplasty  04-06-2006  DR Burney Gauze  . Excision right breast mass   10-20-2005  DR Collier [facility]  . Left wrist arthroscopy w/ debridement and removal cyst  03-26-2004  DR Burney Gauze  . Cholecystectomy  1992  . Breast biopsy  08-13-2009  DR TSUEI    EXCISION LEFT NIPPLE DUCT  . Colon resection  1986    ULCERATIVE COLITIS  . Esophageal manometry N/A 06/11/2013    Procedure: ESOPHAGEAL MANOMETRY (EM);  Surgeon: Jeryl Columbia, MD;  Location: [facility];  Service: Endoscopy;  Laterality: N/A;    There were no vitals filed for this visit.  Visit Diagnosis:  Weakness of back      Subjective Assessment - 05/20/15 0951    Subjective 2 days able to get up with no pain for 2 steps then pain hits.  Has leg pain , shin hurts.   Currently in Pain? Yes   Pain Score 3    Pain Location Back   Pain Descriptors / Indicators Shooting   Pain Radiating Towards Radiates to shin today  Rt knee pain, anterior pain medial   Pain Frequency Constant   Aggravating Factors  walking   Pain Relieving Factors change of position   Multiple Pain Sites No                         [facility] Adult PT Treatment/Exercise - 05/20/15 1008    Bed Mobility   Sit to Supine --  independent sit to supine to sit.  Can move legs independent   Transfers   Sit to Stand --  Husband helped , not sure he needed to help   Lumbar Exercises: Supine   Ab Set 10 reps   Clam 10 reps   Heel Slides 5 reps   Bridge 10 reps  not really able to leave bed.  No pain increased.   Other Supine Lumbar Exercises transversus abdominus . cues 5 reps 2 sets  cues  PT Education - 05/20/15 1323    Education provided Yes   Education Details Bridge   Person(s) Educated Patient;Spouse   Methods Explanation;Tactile cues;Demonstration;Verbal cues;Handout   Comprehension Verbalized understanding;Returned demonstration          PT Short Term Goals - 05/14/15 6283    PT SHORT TERM GOAL #1   Title Patient will be able to demo initial HEP I    Time 4   Period Weeks   Status New   PT SHORT TERM GOAL #2   Title Pt will be able to get onto mat table safely in gym and bed without  A    Time 4   Period Weeks   Status New   PT SHORT TERM GOAL #3   Title Pt will be able to stand to wash dishes, light housework  5-10 min at home with 25% less difficulty, pain.   Time 4   Period Weeks   Status New   PT SHORT TERM GOAL #4   Title Pt will be able to report 25% less Rt. LE pain with daily activities.    Time 4   Period Weeks   Status New           PT Long Term Goals - 05/14/15 1517    PT LONG TERM GOAL #1   Title Pt  will be I with more advanced HEP as of last visit.    Time 8   Period Weeks   Status New   PT LONG TERM GOAL #2   Title Pt will understand posture, body mechanics as it relates to her diagnosis and symptoms.    Time 8   Period Weeks   Status New   PT LONG TERM GOAL #3   Title Pt will be able to sit to stand, change positions with min occasional difficulty.    Time 8   Period Weeks   Status New   PT LONG TERM GOAL #4   Title Pt will be able to walk in the community for 20-30 min and mod increase in pain   Time 8   Period Weeks   Status New   PT LONG TERM GOAL #5   Title Pt will demo hip strength (Rt.) to 4/5 throughout for improved functional mobility.    Time 8   Period Weeks   Status New   Additional Long Term Goals   Additional Long Term Goals Yes   PT LONG TERM GOAL #6   Title Pt will score 45% or less on FOTO to demo improvement in function.   Time 8   Period Weeks   Status New               Plan - 05/20/15 1324    Clinical Impression Statement Late with traffic issues, shorter session.  Pain into leg past knee.  not sure the cause.  progress toward home exercise goals.     PT Next Visit Plan [place] bridge progress stabilization.   Consulted and Agree with Plan of Care Patient;Family member/caregiver        Problem List Patient Active Problem List   Diagnosis Date Noted  . Hiatal hernia 04/13/2013  . Type II or unspecified type diabetes mellitus without mention of complication, uncontrolled 01/26/2013  . Umbilical hernia 61/60/7371  . Hypertension     Shellee Streng 05/20/2015, 1:26 PM  [facility] [address] [place], [place], [address] Phone: [phone]   Fax:  [phone]  Melvenia Needles, PTA 05/20/2015 1:26 PM Phone: [phone] Fax: [phone]

## 2015-05-22 ENCOUNTER — Encounter: Payer: Medicare Other | Admitting: Physical Therapy

## 2015-06-03 ENCOUNTER — Ambulatory Visit: Payer: Medicare Other | Attending: Specialist | Admitting: Physical Therapy

## 2015-06-03 DIAGNOSIS — M25551 Pain in right hip: Secondary | ICD-10-CM | POA: Diagnosis present

## 2015-06-03 DIAGNOSIS — M545 Low back pain, unspecified: Secondary | ICD-10-CM

## 2015-06-03 DIAGNOSIS — R262 Difficulty in walking, not elsewhere classified: Secondary | ICD-10-CM

## 2015-06-03 DIAGNOSIS — M6281 Muscle weakness (generalized): Secondary | ICD-10-CM | POA: Diagnosis present

## 2015-06-03 DIAGNOSIS — R29898 Other symptoms and signs involving the musculoskeletal system: Secondary | ICD-10-CM

## 2015-06-03 NOTE — Therapy (Addendum)
[place], [place], [address] Phone: [phone]   Fax:  [phone]  Physical Therapy Treatment  Patient Details  Name: Lauren [name] MRN: [id number] Date of Birth: 10/06/1946 Referring Provider:  Lona Kettle, MD  Encounter Date: 06/03/2015      PT End of Session - 06/03/15 1108    Visit Number 4   Number of Visits 16   Date for PT Re-Evaluation 07/08/15   PT Start Time 0935   PT Stop Time 1020   PT Time Calculation (min) 45 min   Activity Tolerance Other (comment)  with flexion pt. nerve pain begin to increase, tried extension also increased pain, then manual which was tolerated well   Behavior During Therapy [facility] for tasks assessed/performed      Past Medical History  Diagnosis Date  . GERD (gastroesophageal reflux disease)   . Neuropathy   . Arthritis   . Menopause   . History of kidney stones   . Hypertension   . Endometriosis   . Insomnia   . Vitamin D deficiency   . Umbilical hernia   . Diabetes mellitus   . History of ulcerative colitis     Past Surgical History  Procedure Laterality Date  . Colon surgery    . Wrist surgery      both  . Myomectomy    . Kidney stone removed  2/11  . Right colectomy    . Tendon release    . Right ureteroscopic stone extraction  12-11-2009  DR [facility]  . Left thumb carpometacarpal joint suspensionplasty  04-06-2006  DR Burney Gauze  . Excision right breast mass   10-20-2005  DR Collier Salina [facility]  . Left wrist arthroscopy w/ debridement and removal cyst  03-26-2004  DR Burney Gauze  . Cholecystectomy  1992  . Breast biopsy  08-13-2009  DR TSUEI    EXCISION LEFT NIPPLE DUCT  . Colon resection  1986    ULCERATIVE COLITIS  . Esophageal manometry N/A 06/11/2013    Procedure: ESOPHAGEAL MANOMETRY (EM);  Surgeon: Jeryl Columbia, MD;  Location: [facility];  Service: Endoscopy;  Laterality: N/A;    There were no vitals filed for this visit.  Visit  Diagnosis:  Weakness of back  Right-sided low back pain without sciatica  Pain in hip joint, right  Difficulty walking      Subjective Assessment - 06/03/15 1010    Subjective Pt reported she had a rough night last night. She had to take pain med at 2am. She has been able to perform light house work for longer than 10 minutes but she is still in pain throughout.    Patient is accompained by: Family member   Currently in Pain? Yes   Pain Score 3    Pain Location Back   Pain Orientation Right   Pain Descriptors / Indicators Tingling;Burning   Pain Type Chronic pain   Pain Radiating Towards lateral thigh, new pain in medial thigh            [facility] Adult PT Treatment/Exercise - 06/03/15 0945    Self-Care   Self-Care Other Self-Care Comments   Other Self-Care Comments  pt. husband taught how to massage with Tennis ball and shown how she can do it herself up against a wall   Lumbar Exercises: Stretches   Passive Hamstring Stretch 3 reps;10 seconds   Single Knee to Chest Stretch 5 reps   Lower Trunk Rotation 5 reps   Pelvic Tilt  5 reps   Press Ups 5 reps   Press Ups Limitations on elbows, limited ROM performed due to spondylolisthesis   Modalities   Modalities Ultrasound   Ultrasound   Ultrasound Location mid to upper glute max   Ultrasound Parameters 1.5w /cm2. 1MHz, 52mn   Ultrasound Goals Pain   Manual Therapy   Manual Therapy Soft tissue mobilization;Myofascial release;Manual Traction   Soft tissue mobilization Rt. glute max   Myofascial Release deep pressure to glute with IR,Er of the hip   Manual Traction 15sec hold x2                  PT Education - 06/03/15 1307    Education provided Yes   Education Details pt husband shown how to perform tennis ball massage, paying more attention to HEP exercises and which if any are causing an increase in pain   Person(s) Educated Patient;Spouse   Methods Explanation;Demonstration   Comprehension Verbalized  understanding;Returned demonstration          PT Short Term Goals - 06/03/15 0934    PT SHORT TERM GOAL #1   Title Patient will be able to demo initial HEP I    Time 4   Period Weeks   Status Achieved   PT SHORT TERM GOAL #2   Title Pt will be able to get onto mat table safely in gym and bed without  A    Time 4   Period Weeks   Status Achieved   PT SHORT TERM GOAL #3   Title Pt will be able to stand to wash dishes, light housework  5-10 min at home with 25% less difficulty, pain.   Baseline able to complete more than 167m but pain still present   Time 4   Period Weeks   Status Partially Met   PT SHORT TERM GOAL #4   Title Pt will be able to report 25% less Rt. LE pain with daily activities.    Time 4   Status On-going           PT Long Term Goals - 06/03/15 0936    PT LONG TERM GOAL #1   Title Pt will be I with more advanced HEP as of last visit.    Time 8   Period Weeks   Status On-going   PT LONG TERM GOAL #2   Title Pt will understand posture, body mechanics as it relates to her diagnosis and symptoms.    Time 8   Period Weeks   Status On-going   PT LONG TERM GOAL #3   Title Pt will be able to sit to stand, change positions with min occasional difficulty.    Time 8   Status On-going   PT LONG TERM GOAL #4   Title Pt will be able to walk in the community for 20-30 min and mod increase in pain   Time 8   Period Weeks   Status On-going     G CODES MOBILITY   GCLa Jara 06/03/15 1112    Clinical Impression Statement Pt seemed to be in more pain than reported due to facial grimace, moving slowly.  With exercises her pain increased from a 3/10 to a 5/10.   Manual therapy went better for her. Pt reported a new pain on the medial side of her Rt. thigh, unsure if it is from her back.  She was not able to say if the pain seemed to come around from the back or if it was a seperate pain, therefore she was instructed to  speak with her doctor about it. (sees pain MD next week). She has met several STGs and improving tolerance for chores.    PT Next Visit Plan soft tissue massage, ultrasound, manual traction, HEP   PT Home Exercise Plan contniue with previous HEP, tennis ball manual release   Recommended Other Services Dry needle at pt. request   Consulted and Agree with Plan of Care Patient        Problem List Patient Active Problem List   Diagnosis Date Noted  . Hiatal hernia 04/13/2013  . Type II or unspecified type diabetes mellitus without mention of complication, uncontrolled 01/26/2013  . Umbilical hernia 30/74/6002  . Hypertension     PAA,JENNIFER 06/03/2015, 1:26 PM  [facility] [address] [place], [place], [address] Phone: [phone]   Fax:  [phone]    Raeford Razor, PT 06/03/2015 1:27 PM Phone: [phone] Fax: [phone]  PHYSICAL THERAPY DISCHARGE SUMMARY  Visits from Start of Care: 4  Current functional level related to goals / functional outcomes: See above, otherwise unknown   Remaining deficits: Unknown   Education / Equipment: HEP, massage, body mech and posture Plan: Patient agrees to discharge.  Patient goals were not met. Patient is being discharged due to not returning since the last visit.  ?????   Raeford Razor, PT 09/04/2015 4:25 PM Phone: [phone] Fax: [phone]

## 2015-06-06 ENCOUNTER — Encounter: Payer: Medicare Other | Admitting: Physical Therapy

## 2015-06-10 ENCOUNTER — Ambulatory Visit
Admission: RE | Admit: 2015-06-10 | Discharge: 2015-06-10 | Disposition: A | Discharge status: Home / Self Care | Payer: Medicare Other | Source: Ambulatory Visit | Attending: Anesthesiology | Admitting: Anesthesiology

## 2015-06-10 ENCOUNTER — Other Ambulatory Visit: Payer: Self-pay | Admitting: Anesthesiology

## 2015-06-10 ENCOUNTER — Encounter: Payer: Medicare Other | Admitting: Physical Therapy

## 2015-06-10 DIAGNOSIS — M25551 Pain in right hip: Secondary | ICD-10-CM

## 2015-06-12 ENCOUNTER — Ambulatory Visit: Payer: Medicare Other | Admitting: Physical Therapy

## 2015-06-16 ENCOUNTER — Encounter: Payer: Medicare Other | Admitting: Physical Therapy

## 2015-07-18 ENCOUNTER — Other Ambulatory Visit: Payer: Self-pay

## 2015-07-18 DIAGNOSIS — Z1231 Encounter for screening mammogram for malignant neoplasm of breast: Secondary | ICD-10-CM

## 2015-07-30 ENCOUNTER — Emergency Department (HOSPITAL_COMMUNITY): Payer: Medicare Other

## 2015-07-30 ENCOUNTER — Emergency Department (HOSPITAL_COMMUNITY)
Admission: EM | Admit: 2015-07-30 | Discharge: 2015-07-30 | Disposition: A | Discharge status: Home / Self Care | Payer: Medicare Other | Attending: Emergency Medicine | Admitting: Emergency Medicine

## 2015-07-30 ENCOUNTER — Encounter (HOSPITAL_COMMUNITY): Payer: Self-pay | Admitting: *Deleted

## 2015-07-30 DIAGNOSIS — Z880 Allergy status to penicillin: Secondary | ICD-10-CM | POA: Diagnosis not present

## 2015-07-30 DIAGNOSIS — Z87891 Personal history of nicotine dependence: Secondary | ICD-10-CM | POA: Insufficient documentation

## 2015-07-30 DIAGNOSIS — I10 Essential (primary) hypertension: Secondary | ICD-10-CM | POA: Diagnosis not present

## 2015-07-30 DIAGNOSIS — Z87442 Personal history of urinary calculi: Secondary | ICD-10-CM | POA: Insufficient documentation

## 2015-07-30 DIAGNOSIS — Z79899 Other long term (current) drug therapy: Secondary | ICD-10-CM | POA: Insufficient documentation

## 2015-07-30 DIAGNOSIS — M1990 Unspecified osteoarthritis, unspecified site: Secondary | ICD-10-CM | POA: Diagnosis not present

## 2015-07-30 DIAGNOSIS — R0789 Other chest pain: Secondary | ICD-10-CM | POA: Insufficient documentation

## 2015-07-30 DIAGNOSIS — R079 Chest pain, unspecified: Secondary | ICD-10-CM | POA: Diagnosis present

## 2015-07-30 DIAGNOSIS — Z8669 Personal history of other diseases of the nervous system and sense organs: Secondary | ICD-10-CM | POA: Insufficient documentation

## 2015-07-30 DIAGNOSIS — E559 Vitamin D deficiency, unspecified: Secondary | ICD-10-CM | POA: Diagnosis not present

## 2015-07-30 DIAGNOSIS — K219 Gastro-esophageal reflux disease without esophagitis: Secondary | ICD-10-CM | POA: Diagnosis not present

## 2015-07-30 LAB — HEPATIC FUNCTION PANEL
ALT: 17 U/L (ref 14–54)
AST: 26 U/L (ref 15–41)
Albumin: 3.7 g/dL (ref 3.5–5.0)
Alkaline Phosphatase: 68 U/L (ref 38–126)
BILIRUBIN TOTAL: 0.5 mg/dL (ref 0.3–1.2)
Total Protein: 7.5 g/dL (ref 6.5–8.1)

## 2015-07-30 LAB — BASIC METABOLIC PANEL
Anion gap: 10 (ref 5–15)
BUN: 9 mg/dL (ref 6–20)
CHLORIDE: 103 mmol/L (ref 101–111)
CO2: 25 mmol/L (ref 22–32)
Calcium: 9.4 mg/dL (ref 8.9–10.3)
Creatinine, Ser: 0.93 mg/dL (ref 0.44–1.00)
Glucose, Bld: 256 mg/dL — ABNORMAL HIGH (ref 65–99)
Potassium: 3.6 mmol/L (ref 3.5–5.1)
SODIUM: 138 mmol/L (ref 135–145)

## 2015-07-30 LAB — CBC
HEMATOCRIT: 42.9 % (ref 36.0–46.0)
HEMOGLOBIN: 14.3 g/dL (ref 12.0–15.0)
MCH: 29.1 pg (ref 26.0–34.0)
MCHC: 33.3 g/dL (ref 30.0–36.0)
MCV: 87.4 fL (ref 78.0–100.0)
Platelets: 310 10*3/uL (ref 150–400)
RBC: 4.91 MIL/uL (ref 3.87–5.11)
RDW: 14.1 % (ref 11.5–15.5)
WBC: 13.2 10*3/uL — ABNORMAL HIGH (ref 4.0–10.5)

## 2015-07-30 LAB — I-STAT TROPONIN, ED
Troponin i, poc: 0 ng/mL (ref 0.00–0.08)
Troponin i, poc: 0 ng/mL (ref 0.00–0.08)

## 2015-07-30 LAB — D-DIMER, QUANTITATIVE: D-Dimer, Quant: 1.11 ug/mL-FEU — ABNORMAL HIGH (ref 0.00–0.48)

## 2015-07-30 MED ORDER — IOHEXOL 350 MG/ML SOLN
100.0000 mL | Freq: Once | INTRAVENOUS | Status: AC | PRN
  Administered 2015-07-30: 100 mL via INTRAVENOUS

## 2015-07-30 NOTE — Discharge Instructions (Signed)
Please follow up with your primary care physician in 1-2 days. If you do not have one please call the [facility] number listed above. Please follow up with Dr. Crenshaw to schedule a follow up appointment.  °Please read all discharge instructions and return precautions.  ° ° °Chest Pain (Nonspecific) °It is often hard to give a specific diagnosis for the cause of chest pain. There is always a chance that your pain could be related to something serious, such as a heart attack or a blood clot in the lungs. You need to follow up with your health care provider for further evaluation. °CAUSES  °· Heartburn. °· Pneumonia or bronchitis. °· Anxiety or stress. °· Inflammation around your heart (pericarditis) or lung (pleuritis or pleurisy). °· A blood clot in the lung. °· A collapsed lung (pneumothorax). It can develop suddenly on its own (spontaneous pneumothorax) or from trauma to the chest. °· Shingles infection (herpes zoster virus). °The chest wall is composed of bones, muscles, and cartilage. Any of these can be the source of the pain. °· The bones can be bruised by injury. °· The muscles or cartilage can be strained by coughing or overwork. °· The cartilage can be affected by inflammation and become sore (costochondritis). °DIAGNOSIS  °Lab tests or other studies may be needed to find the cause of your pain. Your health care provider may have you take a test called an ambulatory electrocardiogram (ECG). An ECG records your heartbeat patterns over a 24-hour period. You may also have other tests, such as: °· Transthoracic echocardiogram (TTE). During echocardiography, sound waves are used to evaluate how blood flows through your heart. °· Transesophageal echocardiogram (TEE). °· Cardiac monitoring. This allows your health care provider to monitor your heart rate and rhythm in real time. °· Holter monitor. This is a portable device that records your heartbeat and can help diagnose heart arrhythmias.  It allows your health care provider to track your heart activity for several days, if needed. °· Stress tests by exercise or by giving medicine that makes the heart beat faster. °TREATMENT  °· Treatment depends on what may be causing your chest pain. Treatment may include: °¨ Acid blockers for heartburn. °¨ Anti-inflammatory medicine. °¨ Pain medicine for inflammatory conditions. °¨ Antibiotics if an infection is present. °· You may be advised to change lifestyle habits. This includes stopping smoking and avoiding alcohol, caffeine, and chocolate. °· You may be advised to keep your head raised (elevated) when sleeping. This reduces the chance of acid going backward from your stomach into your esophagus. °Most of the time, nonspecific chest pain will improve within 2-3 days with rest and mild pain medicine.  °HOME CARE INSTRUCTIONS  °· If antibiotics were prescribed, take them as directed. Finish them even if you start to feel better. °· For the next few days, avoid physical activities that bring on chest pain. Continue physical activities as directed. °· Do not use any tobacco products, including cigarettes, chewing tobacco, or electronic cigarettes. °· Avoid drinking alcohol. °· Only take medicine as directed by your health care provider. °· Follow your health care provider's suggestions for further testing if your chest pain does not go away. °· Keep any follow-up appointments you made. If you do not go to an appointment, you could develop lasting (chronic) problems with pain. If there is any problem keeping an appointment, call to reschedule. °SEEK MEDICAL CARE IF:  °· Your chest pain does not go away, even after treatment. °· You   have a rash with blisters on your chest.  You have a fever. SEEK IMMEDIATE MEDICAL CARE IF:   You have increased chest pain or pain that spreads to your arm, neck, jaw, back, or abdomen.  You have shortness of breath.  You have an increasing cough, or you cough up blood.  You  have severe back or abdominal pain.  You feel nauseous or vomit.  You have severe weakness.  You faint.  You have chills. This is an emergency. Do not wait to see if the pain will go away. Get medical help at once. Call your local emergency services (911 in [place] Do not drive yourself to the hospital. MAKE SURE YOU:   Understand these instructions.  Will watch your condition.  Will get help right away if you are not doing well or get worse. Document Released: 07/28/2005 Document Revised: 10/23/2013 Document Reviewed: 05/23/2008 [facility] Patient Information 2015 [place], [place]. This information is not intended to replace advice given to you by your health care provider. Make sure you discuss any questions you have with your health care provider.

## 2015-07-30 NOTE — ED Provider Notes (Signed)
CSN: [id number]     Arrival date & time 07/30/15  1326 History   First MD Initiated Contact with Patient 07/30/15 1524     Chief Complaint  Patient presents with  . Chest Pain     (Consider location/radiation/quality/duration/timing/severity/associated sxs/prior Treatment) HPI Comments: Patient is a 69 yo F PMHx significant for DM, HTN, UC, GERD presenting to the ED for evaluation of intermittent left sided chest pain. Patient describes it as tightness. It is intermittent. She is pain free currently. No specific precipitating factor. Endorses one episode of nausea yesterday w/o vomiting. Denies any SOB, diaphoresis, abdominal pain, HA, visual disturbances.   Patient is a 69 y.o. female presenting with chest pain.  Chest Pain Pain location:  L lateral chest Pain quality: tightness   Onset quality:  Sudden Duration:  4 days Timing:  Intermittent Progression:  Waxing and waning Chronicity:  New   Past Medical History  Diagnosis Date  . GERD (gastroesophageal reflux disease)   . Neuropathy   . Arthritis   . Menopause   . History of kidney stones   . Hypertension   . Endometriosis   . Insomnia   . Vitamin D deficiency   . Umbilical hernia   . Diabetes mellitus   . History of ulcerative colitis    Past Surgical History  Procedure Laterality Date  . Colon surgery    . Wrist surgery      both  . Myomectomy    . Kidney stone removed  2/11  . Right colectomy    . Tendon release    . Right ureteroscopic stone extraction  12-11-2009  DR [facility]  . Left thumb carpometacarpal joint suspensionplasty  04-06-2006  DR Burney Gauze  . Excision right breast mass   10-20-2005  DR Collier Salina YOU[facility]  . Left wrist arthroscopy w/ debridement and removal cyst  03-26-2004  DR Burney Gauze  . Cholecystectomy  1992  . Breast biopsy  08-13-2009  DR TSUEI    EXCISION LEFT NIPPLE DUCT  . Colon resection  1986    ULCERATIVE COLITIS  . Esophageal manometry N/A 06/11/2013    Procedure: ESOPHAGEAL MANOMETRY  (EM);  Surgeon: Jeryl Columbia, MD;  Location: [facility];  Service: Endoscopy;  Laterality: N/A;   Family History  Problem Relation Age of Onset  . Hypertension Father   . Heart disease Father   . Diabetes Father   . Cancer Father     colon, prostate, and kidney  . Hypertension Mother   . Heart disease Maternal Grandmother   . Diabetes Maternal Grandmother   . Heart disease Maternal Grandfather   . Diabetes Maternal Grandfather   . Cancer Maternal Grandfather     pt unaware of what kind   Social History  Substance Use Topics  . Smoking status: Former Smoker -- 1.00 packs/day for 10 years    Types: Cigarettes    Quit date: 09/22/1982  . Smokeless tobacco: Never Used     Comment: quit 1983  . Alcohol Use: No   OB History    Gravida Para Term Preterm AB TAB SAB Ectopic Multiple Living   2 2        2      Review of Systems  Cardiovascular: Positive for chest pain.  All other systems reviewed and are negative.     Allergies  Percocet; Ciprocin-fluocin-procin; Penicillins; Sulfa antibiotics; Tape; and Tetanus toxoids  Home Medications   Prior to Admission medications   Medication Sig Start Date End Date Taking? Authorizing  Provider  balsalazide (COLAZAL) 750 MG capsule  06/20/12   Historical Provider, MD  Canagliflozin (INVOKANA) 100 MG TABS Take by mouth.    Historical Provider, MD  Cholecalciferol (VITAMIN D PO) Take 4,000 Int'l Units by mouth daily. Twice a week    Historical Provider, MD  cholestyramine Lucrezia Starch) 4 GM/DOSE powder Take by mouth daily.      Historical Provider, MD  Diclofenac Sodium (VOLTAREN PO) Take by mouth.    Historical Provider, MD  Estradiol (VAGIFEM) 10 MCG TABS vaginal tablet Place 1 tablet (10 mcg total) vaginally 2 (two) times a week. Patient not taking: Reported on 05/13/2015 02/26/14   Huel Cote, NP  furosemide (LASIX) 20 MG tablet  06/29/12   Historical Provider, MD  glucose blood (ACCU-CHEK AVIVA) test strip 1 each by Other route as  needed for other. Use as instructed    Historical Provider, MD  HYDROcodone-acetaminophen (NORCO) 7.5-325 MG per tablet Take 1 tablet by mouth every 6 (six) hours as needed for pain.    Historical Provider, MD  metFORMIN (GLUCOPHAGE-XR) 500 MG 24 hr tablet  07/17/12   Historical Provider, MD  methocarbamol (ROBAXIN) 500 MG tablet Take 500 mg by mouth daily. 1 daily prn    Historical Provider, MD  pantoprazole (PROTONIX) 40 MG tablet Take 40 mg by mouth 2 (two) times daily.     Historical Provider, MD  pregabalin (LYRICA) 75 MG capsule Take 75 mg by mouth 2 (two) times daily.    Historical Provider, MD  quinapril (ACCUPRIL) 20 MG tablet Take 40 mg by mouth at bedtime.     Historical Provider, MD   BP 143/77 mmHg  Pulse 80  Temp(Src) 97.8 F (36.6 C) (Oral)  Resp 18  Ht 5\' 5"  (1.651 m)  Wt 225 lb (102.059 kg)  BMI 37.44 kg/m2  SpO2 97% Physical Exam  Constitutional: She is oriented to person, place, and time. She appears well-developed and well-nourished. No distress.  HENT:  Head: Normocephalic and atraumatic.  Right Ear: External ear normal.  Left Ear: External ear normal.  Nose: Nose normal.  Eyes: Conjunctivae are normal.  Neck: Neck supple.  Cardiovascular: Normal rate, regular rhythm and normal heart sounds.   Pulmonary/Chest: Effort normal and breath sounds normal. No respiratory distress. She exhibits no tenderness.  Abdominal: Soft. There is no tenderness.  Musculoskeletal: Normal range of motion. She exhibits no edema.  Neurological: She is alert and oriented to person, place, and time.  Skin: Skin is warm and dry. No rash noted. She is not diaphoretic.  Nursing note and vitals reviewed.   ED Course  Procedures (including critical care time) Medications  iohexol (OMNIPAQUE) 350 MG/ML injection 100 mL (100 mLs Intravenous Contrast Given 07/30/15 1654)    Labs Review Labs Reviewed  BASIC METABOLIC PANEL - Abnormal; Notable for the following:    Glucose, Bld 256 (*)     All other components within normal limits  CBC - Abnormal; Notable for the following:    WBC 13.2 (*)    All other components within normal limits  HEPATIC FUNCTION PANEL - Abnormal; Notable for the following:    Bilirubin, Direct <0.1 (*)    All other components within normal limits  D-DIMER, QUANTITATIVE (NOT AT [facility]) - Abnormal; Notable for the following:    D-Dimer, Quant 1.11 (*)    All other components within normal limits  I-STAT TROPOININ, ED  Randolm Idol, ED    Imaging Review Dg Chest 2 View  07/30/2015  CLINICAL DATA:  Chest pain.  EXAM: CHEST  2 VIEW  COMPARISON:  04/19/2013 chest radiograph.  FINDINGS: Stable cardiomediastinal silhouette with top-normal heart size. No pneumothorax. No pleural effusion. Clear lungs, with no pulmonary edema. Right upper quadrant cholecystectomy clips.  IMPRESSION: No active disease in the chest.   Electronically Signed   By: Ilona Sorrel M.D.   On: 07/30/2015 15:18   Ct Angio Chest Pe W/cm &/or Wo Cm  07/30/2015   CLINICAL DATA:  Tightness of the chest and pain under the shoulder blade and breast area. Elevated D-dimers.  EXAM: CT ANGIOGRAPHY CHEST WITH CONTRAST  TECHNIQUE: Multidetector CT imaging of the chest was performed using the standard protocol during bolus administration of intravenous contrast. Multiplanar CT image reconstructions and MIPs were obtained to evaluate the vascular anatomy.  CONTRAST:  132mL OMNIPAQUE IOHEXOL 350 MG/ML SOLN  COMPARISON:  Chest x-ray July 30, 2015  FINDINGS: There is no pulmonary embolus. The aorta is normal. There is no mediastinal or hilar lymphadenopathy. The heart size is normal. No focal pneumonia, pleural effusion is identified. In the posterior basal segment of the left lower lobe, there is a 7 mm nodule. There is a 3 mm nodule in the right middle lobe on image 44. The visualized upper abdominal structures demonstrates a 1.4 cm low-density lesion in the left lobe liver incompletely included  possibly a cyst. There is a minimal hiatal hernia. Degenerative joint changes of the spine are noted.  Review of the MIP images confirms the above findings.  IMPRESSION: No pulmonary embolus.  No acute abnormality identified within the chest.  Bilateral pulmonary nodules. If the patient is at high risk for bronchogenic carcinoma, follow-up chest CT at 3-91months is recommended. If the patient is at low risk for bronchogenic carcinoma, follow-up chest CT at 6-12 months is recommended. This recommendation follows the consensus statement: Guidelines for Management of Small Pulmonary Nodules Detected on CT Scans: A Statement from the [employer] as published in Radiology 2005; 237:395-400.   Electronically Signed   By: Abelardo Diesel M.D.   On: 07/30/2015 17:27   I have personally reviewed and evaluated these images and lab results as part of my medical decision-making.   EKG Interpretation None      MDM   Final diagnoses:  Chest pain, atypical    Filed Vitals:   07/30/15 1838  BP:   Pulse: 80  Temp:   Resp:    Afebrile, NAD, non-toxic appearing, AAOx4.   Patient is to be discharged with recommendation to follow up with PCP and cardiology in regards to today's hospital visit. Negative ct angio for PE did discuss need for repeat CT for re-evaluation of pulmonary nodules, VSS, no tracheal deviation, no JVD or new murmur, RRR, breath sounds equal bilaterally, EKG without acute abnormalities, negative delta troponin, and negative CXR. Pt has been advised to return to the ED is CP becomes exertional, associated with diaphoresis or nausea, radiates to left jaw/arm, worsens or becomes concerning in any way. Pt appears reliable for follow up and is agreeable to discharge.   Case has been discussed with and seen by Dr. Oleta Mouse who agrees with the above plan to discharge.      Baron Sane, PA-C 07/30/15 1900  Forde Dandy, MD 07/31/15 [phone]

## 2015-07-30 NOTE — ED Notes (Signed)
Spoke with main lab to add on HFP, and D-Dimer

## 2015-07-30 NOTE — ED Notes (Signed)
Pt reports left chest and shoulder blade pain for 4 days. Pt states that pain is intermittent and has no worsening or relieving factors.

## 2015-08-21 ENCOUNTER — Ambulatory Visit: Payer: Medicare Other

## 2015-09-03 ENCOUNTER — Telehealth: Payer: Self-pay | Admitting: Cardiovascular Disease

## 2015-09-03 NOTE — Telephone Encounter (Signed)
Received records from [facility] for appointment on 09/12/15 with Dr Oval Linsey.  Records given to [employer] (medical records) for Dr Blenda Mounts schedule on 09/12/15. lp

## 2015-09-08 ENCOUNTER — Ambulatory Visit
Admission: RE | Admit: 2015-09-08 | Discharge: 2015-09-08 | Disposition: A | Discharge status: Home / Self Care | Payer: Medicare Other | Source: Ambulatory Visit

## 2015-09-08 DIAGNOSIS — Z1231 Encounter for screening mammogram for malignant neoplasm of breast: Secondary | ICD-10-CM

## 2015-09-12 ENCOUNTER — Encounter: Payer: Self-pay | Admitting: Cardiovascular Disease

## 2015-09-12 ENCOUNTER — Ambulatory Visit (INDEPENDENT_AMBULATORY_CARE_PROVIDER_SITE_OTHER): Payer: Medicare Other | Admitting: Cardiovascular Disease

## 2015-09-12 VITALS — BP 184/82 | HR 81 | Ht 64.0 in | Wt 231.1 lb

## 2015-09-12 DIAGNOSIS — I10 Essential (primary) hypertension: Secondary | ICD-10-CM | POA: Diagnosis not present

## 2015-09-12 DIAGNOSIS — R079 Chest pain, unspecified: Secondary | ICD-10-CM | POA: Diagnosis not present

## 2015-09-12 DIAGNOSIS — E785 Hyperlipidemia, unspecified: Secondary | ICD-10-CM | POA: Diagnosis not present

## 2015-09-12 MED ORDER — ROSUVASTATIN CALCIUM 20 MG PO TABS: 20.0000 mg | ORAL_TABLET | Freq: Every day | ORAL | Status: DC

## 2015-09-12 NOTE — Patient Instructions (Signed)
Your physician has recommended you make the following change in your medication...  1. START aspirin 81mg  once daily  2. START crestor 20mg  once daily for cholesterol   Your physician has requested that you have a lexiscan myoview. For further information please visit [email or web address]. Please follow instruction sheet, as given.  Your physician recommends that you schedule a follow-up appointment in 3 months with Dr. Oval Linsey

## 2015-09-12 NOTE — Progress Notes (Signed)
Cardiology Office Note   Date:  09/12/2015   ID:  Lauren [name], DOB October 04, 1946, MRN [id number]  PCP:   Melinda Crutch, MD  Cardiologist:   Sharol Harness, MD   Chief Complaint  Patient presents with  . New Evaluation    post hosp. for evaluation      History of Present Illness: Lauren [name] is a 69 y.o. female with diabetes, hypertension, and hyperlipidemia who presents for an evaluation of chest pain.  Lauren [name] first had some chest heaviness at the end of September.  At that time the pain was under her L shoulder that lasted for 3 days.  She tried taking Rolaids and Zantac and called Dr. Harrington Challenger when this wasn't alleviating her symptoms.  She was instructed to go to the ED, where cardiac enzymes were negative.  D-dimer was elevated, so she underwent CT-A of the chest that was negative for PE.  She was instructed to follow up with cardiology.  Since then she has noted one or two episodes of chest and back discomfort.  She describes it as tightness that lasts for 10-15 minutes at a time.  ghth  The pain is 6-7/10 in severity.  It can happen at rest and doesn't increase with exertion.  There is no associated nausea or diaphoresis.    Lauren [name]'s father had several MIs starting in his 33s and later underwent CABG.  He also had CEA x2 and multiple TIAs.  Her mother also had CAD and underwent PCI.  She has several family members with heart disease, strokes and diabetes.  She reports that her diet is poor and that her lipids were checked recently and were not at goal.  Lauren [name] does not exercise due to back pain.  She has lumbar disc disease.   Past Medical History  Diagnosis Date  . GERD (gastroesophageal reflux disease)   . Neuropathy ([place])   . Arthritis   . Menopause   . History of kidney stones   . Hypertension   . Endometriosis   . Insomnia   . Vitamin D deficiency   . Umbilical hernia   . Diabetes mellitus   . History of ulcerative colitis     Past  Surgical History  Procedure Laterality Date  . Colon surgery    . Wrist surgery      both  . Myomectomy    . Kidney stone removed  2/11  . Right colectomy    . Tendon release    . Right ureteroscopic stone extraction  12-11-2009  DR [facility]  . Left thumb carpometacarpal joint suspensionplasty  04-06-2006  DR Burney Gauze  . Excision right breast mass   10-20-2005  DR Collier [facility]ina [facility]  . Left wrist arthroscopy w/ debridement and removal cyst  03-26-2004  DR Burney Gauze  . Cholecystectomy  1992  . Breast biopsy  08-13-2009  DR TSUEI    EXCISION LEFT NIPPLE DUCT  . Colon resection  1986    ULCERATIVE COLITIS  . Esophageal manometry N/A 06/11/2013    Procedure: ESOPHAGEAL MANOMETRY (EM);  Surgeon: Jeryl Columbia, MD;  Location: [facility] END[facility];  Service: Endoscopy;  Laterality: N/A;     Current Outpatient Prescriptions  Medication Sig Dispense Refill  . balsalazide (COLAZAL) 750 MG capsule 4 tabs by mouth two times a day    . Canagliflozin (INVOKANA) 100 MG TABS Take by mouth.    . Cholecalciferol (VITAMIN D PO) Take 4,000 Int'l Units by mouth daily. Twice a  week    . cholestyramine (QUESTRAN) 4 GM/DOSE powder Take by mouth daily.      . fluticasone (FLONASE) 50 MCG/ACT nasal spray USE 1-2 SPRAYS IN EACH NOSTRIL ONCE A DAY  12  . furosemide (LASIX) 20 MG tablet Take 20 mg by mouth daily.     Marland Kitchen gabapentin (NEURONTIN) 300 MG capsule Take 300 mg by mouth at bedtime.  1  . glimepiride (AMARYL) 2 MG tablet   0  . glucose blood (ACCU-CHEK AVIVA) test strip 1 each by Other route as needed for other. Use as instructed    . HYDROcodone-acetaminophen (NORCO/VICODIN) 5-325 MG tablet Take 1 tablet by mouth every 6 (six) hours as needed.  0  . metFORMIN (GLUCOPHAGE-XR) 500 MG 24 hr tablet Take 1,000 mg by mouth daily with breakfast.     . pantoprazole (PROTONIX) 40 MG tablet Take 40 mg by mouth 2 (two) times daily.     . quinapril (ACCUPRIL) 40 MG tablet 1 TABLET ONCE A DAY ORALLY  0  . traMADol (ULTRAM) 50 MG  tablet Take 2 tablets by mouth every 6 (six) hours as needed (pain).   2   No current facility-administered medications for this visit.    Allergies:   Percocet; Ciprocin-fluocin-procin; Penicillins; Sulfa antibiotics; Tape; and Tetanus toxoids    Social History:  The patient  reports that she quit smoking about 32 years ago. Her smoking use included Cigarettes. She has a 10 pack-year smoking history. She has never used smokeless tobacco. She reports that she does not drink alcohol or use illicit drugs.   Family History:  The patient's family history includes Cancer in her father and maternal grandfather; Diabetes in her father, maternal grandfather, and maternal grandmother; Heart disease in her father, maternal grandfather, and maternal grandmother; Hypertension in her father and mother.    ROS:  Please see the history of present illness.   Otherwise, review of systems are positive for none.   All other systems are reviewed and negative.    PHYSICAL EXAM: VS:  Ht 5\' 4"  (1.626 m)  Wt 104.826 kg (231 lb 1.6 oz)  BMI 39.65 kg/m2 , BMI Body mass index is 39.65 kg/(m^2). GENERAL:  Well appearing HEENT:  Pupils equal round and reactive, fundi not visualized, oral mucosa unremarkable NECK:  No jugular venous distention, waveform within normal limits, carotid upstroke brisk and symmetric, no bruits, no thyromegaly LYMPHATICS:  No cervical adenopathy LUNGS:  Clear to auscultation bilaterally HEART:  RRR.  PMI not displaced or sustained,S1 and S2 within normal limits, no S3, no S4, no clicks, no rubs, no murmurs ABD:  Flat, positive bowel sounds normal in frequency in pitch, no bruits, no rebound, no guarding, no midline pulsatile mass, no hepatomegaly, no splenomegaly EXT:  2 plus pulses throughout, no edema, no cyanosis no clubbing SKIN:  No rashes no nodules NEURO:  Cranial nerves II through XII grossly intact, motor grossly intact throughout PSYCH:  Cognitively intact, oriented to person  place and time   EKG:  EKG is ordered today. The ekg ordered today demonstrates sinus rhythm rate 81 bpm.   Recent Labs: 07/30/2015: ALT 17; BUN 9; Creatinine, Ser 0.93; Hemoglobin 14.3; Platelets 310; Potassium 3.6; Sodium 138    Lipid Panel No results found for: CHOL, TRIG, HDL, CHOLHDL, VLDL, LDLCALC, LDLDIRECT    Wt Readings from Last 3 Encounters:  09/12/15 104.826 kg (231 lb 1.6 oz)  07/30/15 102.059 kg (225 lb)  02/26/14 101.606 kg (224 lb)  ASSESSMENT AND PLAN:  # Chest pain: Symptoms are atypical.  We will obtain an exercise Cardiolite given that she cannot walk on a treadmill.  Start aspirin 81 mg daily.  We will determine whether this is necessary long-term based on her lipid profile.  # Hypertension:  BP poorly-controlled.  She has not yet taken her BP medication. Continue quinipril and reassess at her follow up appointment.  # Hyperlipidemia: Lauren [name] reports that her lipids have been elevated and she was encouraged to start a statin.  Given her poor diet and lack of exercise, this is unlikely to improve.  Also, she has diabetes, which is a CVD equivalent.  We discussed the importance of exercise and lifestyle changes.  We will start rosuvastatin 20 mg daily and repeat lipids and CMP at her follow up appointment.  Current medicines are reviewed at length with the patient today.  The patient does not have concerns regarding medicines.  The following changes have been made:  Start aspirin 81 mg and rosuvastatin 20 mg daily.  Labs/ tests ordered today include:  No orders of the defined types were placed in this encounter.     Disposition:   FU with Muadh Creasy C. Oval Linsey, MD in 3 months.    Signed, Sharol Harness, MD  09/12/2015 3:34 PM    [place]

## 2015-09-14 ENCOUNTER — Encounter: Payer: Self-pay | Admitting: Cardiovascular Disease

## 2015-09-24 ENCOUNTER — Telehealth (HOSPITAL_COMMUNITY): Payer: Self-pay

## 2015-09-24 NOTE — Telephone Encounter (Signed)
I did explain to the pt that I will be calling her closer to testing time and review with her the instructions for her appointment.

## 2015-10-02 ENCOUNTER — Inpatient Hospital Stay (HOSPITAL_COMMUNITY): Admission: RE | Admit: 2015-10-02 | Payer: Medicare Other | Source: Ambulatory Visit

## 2015-10-03 ENCOUNTER — Encounter (HOSPITAL_COMMUNITY): Payer: Medicare Other

## 2015-10-21 ENCOUNTER — Telehealth: Payer: Self-pay | Admitting: Cardiovascular Disease

## 2015-10-21 NOTE — Telephone Encounter (Signed)
She wanted to know if you received fax for pt to stop her aspirin prior to her surgery. Would you please call today or fax it today if possible please.

## 2015-10-22 NOTE — Telephone Encounter (Signed)
Spoke to Dr Hardin Negus' office  received clearance form - to hold aspirin for 7 days. Radio frequency ablation

## 2015-10-28 ENCOUNTER — Ambulatory Visit (HOSPITAL_COMMUNITY): Payer: Medicare Other

## 2015-10-29 ENCOUNTER — Ambulatory Visit (HOSPITAL_COMMUNITY): Payer: Medicare Other

## 2015-11-05 ENCOUNTER — Encounter: Payer: Self-pay | Admitting: Women's Health

## 2015-11-05 ENCOUNTER — Ambulatory Visit (INDEPENDENT_AMBULATORY_CARE_PROVIDER_SITE_OTHER): Payer: PPO | Admitting: Women's Health

## 2015-11-05 VITALS — BP 126/80 | Ht 64.0 in | Wt 231.0 lb

## 2015-11-05 DIAGNOSIS — N898 Other specified noninflammatory disorders of vagina: Secondary | ICD-10-CM

## 2015-11-05 DIAGNOSIS — B373 Candidiasis of vulva and vagina: Secondary | ICD-10-CM

## 2015-11-05 DIAGNOSIS — B3731 Acute candidiasis of vulva and vagina: Secondary | ICD-10-CM

## 2015-11-05 DIAGNOSIS — N3000 Acute cystitis without hematuria: Secondary | ICD-10-CM

## 2015-11-05 DIAGNOSIS — R350 Frequency of micturition: Secondary | ICD-10-CM

## 2015-11-05 LAB — WET PREP FOR TRICH, YEAST, CLUE
CLUE CELLS WET PREP: NONE SEEN
TRICH WET PREP: NONE SEEN

## 2015-11-05 LAB — URINALYSIS W MICROSCOPIC + REFLEX CULTURE
BILIRUBIN URINE: NEGATIVE
CASTS: NONE SEEN [LPF]
Crystals: NONE SEEN [HPF]
Glucose, UA: NEGATIVE
Hgb urine dipstick: NEGATIVE
NITRITE: NEGATIVE
SPECIFIC GRAVITY, URINE: 1.025 (ref 1.001–1.035)
YEAST: NONE SEEN [HPF]
pH: 5.5 (ref 5.0–8.0)

## 2015-11-05 MED ORDER — NITROFURANTOIN MONOHYD MACRO 100 MG PO CAPS: 100.0000 mg | ORAL_CAPSULE | Freq: Two times a day (BID) | ORAL | Status: AC

## 2015-11-05 MED ORDER — FLUCONAZOLE 150 MG PO TABS: 150.0000 mg | ORAL_TABLET | Freq: Once | ORAL | Status: DC

## 2015-11-05 NOTE — Patient Instructions (Signed)
Monilial Vaginitis Vaginitis in a soreness, swelling and redness (inflammation) of the vagina and vulva. Monilial vaginitis is not a sexually transmitted infection. CAUSES  Yeast vaginitis is caused by yeast (candida) that is normally found in your vagina. With a yeast infection, the candida has overgrown in number to a point that upsets the chemical balance. SYMPTOMS   White, thick vaginal discharge.  Swelling, itching, redness and irritation of the vagina and possibly the lips of the vagina (vulva).  Burning or painful urination.  Painful intercourse. DIAGNOSIS  Things that may contribute to monilial vaginitis are:  Postmenopausal and virginal states.  Pregnancy.  Infections.  Being tired, sick or stressed, especially if you had monilial vaginitis in the past.  Diabetes. Good control will help lower the chance.  Birth control pills.  Tight fitting garments.  Using bubble bath, feminine sprays, douches or deodorant tampons.  Taking certain medications that kill germs (antibiotics).  Sporadic recurrence can occur if you become ill. TREATMENT  Your caregiver will give you medication.  There are several kinds of anti monilial vaginal creams and suppositories specific for monilial vaginitis. For recurrent yeast infections, use a suppository or cream in the vagina 2 times a week, or as directed.  Anti-monilial or steroid cream for the itching or irritation of the vulva may also be used. Get your caregiver's permission.  Painting the vagina with methylene blue solution may help if the monilial cream does not work.  Eating yogurt may help prevent monilial vaginitis. HOME CARE INSTRUCTIONS   Finish all medication as prescribed.  Do not have sex until treatment is completed or after your caregiver tells you it is okay.  Take warm sitz baths.  Do not douche.  Do not use tampons, especially scented ones.  Wear cotton underwear.  Avoid tight pants and panty  hose.  Tell your sexual partner that you have a yeast infection. They should go to their caregiver if they have symptoms such as mild rash or itching.  Your sexual partner should be treated as well if your infection is difficult to eliminate.  Practice safer sex. Use condoms.  Some vaginal medications cause latex condoms to fail. Vaginal medications that harm condoms are:  Cleocin cream.  Butoconazole (Femstat).  Terconazole (Terazol) vaginal suppository.  Miconazole (Monistat) (may be purchased over the counter). SEEK MEDICAL CARE IF:   You have a temperature by mouth above 102 F (38.9 C).  The infection is getting worse after 2 days of treatment.  The infection is not getting better after 3 days of treatment.  You develop blisters in or around your vagina.  You develop vaginal bleeding, and it is not your menstrual period.  You have pain when you urinate.  You develop intestinal problems.  You have pain with sexual intercourse.   This information is not intended to replace advice given to you by your health care provider. Make sure you discuss any questions you have with your health care provider.   Document Released: 07/28/2005 Document Revised: 01/10/2012 Document Reviewed: 04/21/2015 Elsevier Interactive Patient Education 2016 Else[employer] Urinary Tract Infection Urinary tract infections (UTIs) can develop anywhere along your urinary tract. Your urinary tract is your body's drainage system for removing wastes and extra water. Your urinary tract includes two kidneys, two ureters, a bladder, and a urethra. Your kidneys are a pair of bean-shaped organs. Each kidney is about the size of your fist. They are located below your ribs, one on each side of your spine. CAUSES Infections are  caused by microbes, which are microscopic organisms, including fungi, viruses, and bacteria. These organisms are so small that they can only be seen through a microscope. Bacteria are  the microbes that most commonly cause UTIs. SYMPTOMS  Symptoms of UTIs may vary by age and gender of the patient and by the location of the infection. Symptoms in Lauren [name] women typically include a frequent and intense urge to urinate and a painful, burning feeling in the bladder or urethra during urination. Older women and men are more likely to be tired, shaky, and weak and have muscle aches and abdominal pain. A fever may mean the infection is in your kidneys. Other symptoms of a kidney infection include pain in your back or sides below the ribs, nausea, and vomiting. DIAGNOSIS To diagnose a UTI, your caregiver will ask you about your symptoms. Your caregiver will also ask you to provide a urine sample. The urine sample will be tested for bacteria and white blood cells. White blood cells are made by your body to help fight infection. TREATMENT  Typically, UTIs can be treated with medication. Because most UTIs are caused by a bacterial infection, they usually can be treated with the use of antibiotics. The choice of antibiotic and length of treatment depend on your symptoms and the type of bacteria causing your infection. HOME CARE INSTRUCTIONS  If you were prescribed antibiotics, take them exactly as your caregiver instructs you. Finish the medication even if you feel better after you have only taken some of the medication.  Drink enough water and fluids to keep your urine clear or pale yellow.  Avoid caffeine, tea, and carbonated beverages. They tend to irritate your bladder.  Empty your bladder often. Avoid holding urine for long periods of time.  Empty your bladder before and after sexual intercourse.  After a bowel movement, women should cleanse from front to back. Use each tissue only once. SEEK MEDICAL CARE IF:   You have back pain.  You develop a fever.  Your symptoms do not begin to resolve within 3 days. SEEK IMMEDIATE MEDICAL CARE IF:   You have severe back pain or lower  abdominal pain.  You develop chills.  You have nausea or vomiting.  You have continued burning or discomfort with urination. MAKE SURE YOU:   Understand these instructions.  Will watch your condition.  Will get help right away if you are not doing well or get worse.   This information is not intended to replace advice given to you by your health care provider. Make sure you discuss any questions you have with your health care provider.   Document Released: 07/28/2005 Document Revised: 07/09/2015 Document Reviewed: 11/26/2011 Elsevier Interactive Patient Education [employer].

## 2015-11-05 NOTE — Progress Notes (Signed)
Patient ID: Lauren [name], female   DOB: [birth year], 70 y.o.   MRN: [id number] Presents with complaint of increased urinary frequency with urgency and pain at end of stream of urination for most of this week. States also having some vaginal irritation, sensitivity with mild itching. Denies abdominal pain, fever. Postmenopausal on no HRT with no bleeding.  Exam: Walking with a cane. Appears well. External genitalia mild erythema at introitus, wet prep done with a Q-tip positive for a few yeast.  UA: +1 leukocytes, 40-60 wbc's, 0 - 2 RBCs, many bacteria, 20-40 squamous epithelials  UTI Yeast vaginitis  Plan: Has numerous allergies. Macrobid twice daily for 7 days take with food. Diflucan 150 by mouth 1 dose prescription, proper use given and reviewed instructed to call if continued symptoms. UTI prevention discussed.

## 2015-11-05 NOTE — Addendum Note (Signed)
Addended by: Burnett Kanaris on: 11/05/2015 03:26 PM   Modules accepted: Orders

## 2015-11-06 ENCOUNTER — Telehealth (HOSPITAL_COMMUNITY): Payer: Self-pay

## 2015-11-06 NOTE — Telephone Encounter (Signed)
Encounter complete. 

## 2015-11-07 LAB — URINE CULTURE

## 2015-11-10 ENCOUNTER — Encounter: Payer: Self-pay | Admitting: Women's Health

## 2015-11-11 ENCOUNTER — Ambulatory Visit (HOSPITAL_COMMUNITY): Payer: Medicare Other

## 2015-11-12 ENCOUNTER — Ambulatory Visit (HOSPITAL_COMMUNITY): Payer: Medicare Other

## 2015-11-18 ENCOUNTER — Telehealth: Payer: Self-pay | Admitting: Cardiovascular Disease

## 2015-11-18 DIAGNOSIS — I10 Essential (primary) hypertension: Secondary | ICD-10-CM

## 2015-11-18 DIAGNOSIS — R079 Chest pain, unspecified: Secondary | ICD-10-CM

## 2015-11-18 NOTE — Telephone Encounter (Signed)
Pt says she needs to talk to Dr Oval Linsey or her nurse. She is having some back issues that she can not tolerate taking the two day stress test.She wants to know what else can she do?

## 2015-11-18 NOTE — Telephone Encounter (Signed)
The other option is a dobutamine stress test.  This is a one day study.

## 2015-11-19 NOTE — Telephone Encounter (Signed)
RN spoke to nuclear staff- patient will be able to do lexiscan  myoview  in one day setting instead of two day process. Per  Shirlean Mylar,  If patient needs a stress test due to breathing issue  - will need to order adenosine instead of dobutamine stress test.   RN spoke to Dr Noralee Space per Dr Oval Linsey ,to proceed with one day lexiscan myoview.  Informed patient , the test will be able to be done in one day-information was given and question were answered. Patient in agreement to try again. Aware scheduler will call to set up test

## 2015-11-26 DIAGNOSIS — M25551 Pain in right hip: Secondary | ICD-10-CM | POA: Diagnosis not present

## 2015-11-26 DIAGNOSIS — M47816 Spondylosis without myelopathy or radiculopathy, lumbar region: Secondary | ICD-10-CM | POA: Diagnosis not present

## 2015-11-26 DIAGNOSIS — Z79899 Other long term (current) drug therapy: Secondary | ICD-10-CM | POA: Diagnosis not present

## 2015-11-26 DIAGNOSIS — G894 Chronic pain syndrome: Secondary | ICD-10-CM | POA: Diagnosis not present

## 2015-11-27 ENCOUNTER — Telehealth (HOSPITAL_COMMUNITY): Payer: Self-pay

## 2015-11-27 NOTE — Telephone Encounter (Signed)
Encounter complete. 

## 2015-12-02 ENCOUNTER — Inpatient Hospital Stay (HOSPITAL_COMMUNITY): Admission: RE | Admit: 2015-12-02 | Payer: PPO | Source: Ambulatory Visit

## 2015-12-04 NOTE — Progress Notes (Signed)
Cardiology Office Note   Date:  12/08/2015   ID:  Lauren [name], DOB 1946/01/03, MRN [id number]  PCP:   Melinda Crutch, MD  Cardiologist:   Sharol Harness, MD   Chief Complaint  [name] presents with  . Follow-up    3 months//pt states no Sx.     [name] ID: Lauren [name] is a 70 y.o. female with diabetes, hypertension, and hyperlipidemia who presents for an evaluation of chest pain.    Interval History 12/08/15: After her last appointment Lauren [name] was referred for stress testing.  Lexiscan Cardiolite was ordered but has not yet been performed.  She has been in a lot of back pain.  She has been unabl to get Lauren stress test because she cannot lay flat on her back.  She had back injections and continues to be in pain.  She has been doing back exercises and may have to go to physical therapy if Lauren back does not improve soon.  She sees her orthopedic surgeon next month.  She has been doing well from a cardiac standpoint.  She denies chest pain or shortness of breath.  She also denies lower extremity edema, orthopnea, PND, lightheadedness, dizziness or palpitations.    History of Present Illness 09/12/15: Lauren [name] first had some chest heaviness at Lauren end of September.  At that time Lauren pain was under her L shoulder that lasted for 3 days.  She tried taking Rolaids and Zantac and called Dr. Harrington Challenger when this wasn't alleviating her symptoms.  She was instructed to go to Lauren ED, where cardiac enzymes were negative.  D-dimer was elevated, so she underwent CT-A of Lauren chest that was negative for PE.  She was instructed to follow up with cardiology.  Since then she has noted one or two episodes of chest and back discomfort.  She describes it as tightness that lasts for 10-15 minutes at a time.  ghth  Lauren pain is 6-7/10 in severity.  It can happen at rest and doesn't increase with exertion.  There is no associated nausea or diaphoresis.    Lauren [name]'s father had several MIs  starting in his 12s and later underwent CABG.  He also had CEA x2 and multiple TIAs.  Her mother also had CAD and underwent PCI.  She has several family members with heart disease, strokes and diabetes.  She reports that her diet is poor and that her lipids were checked recently and were not at goal.  Lauren [name] does not exercise due to back pain.  She has lumbar disc disease.   Past Medical History  Diagnosis Date  . GERD (gastroesophageal reflux disease)   . Neuropathy ([place])   . Arthritis   . History of kidney stones   . Hypertension   . Endometriosis   . Insomnia   . Vitamin D deficiency   . Umbilical hernia   . History of ulcerative colitis     Past Surgical History  Procedure Laterality Date  . Colon surgery    . Wrist surgery      both  . Myomectomy    . Kidney stone removed  2/11  . Right colectomy    . Tendon release    . Right ureteroscopic stone extraction  12-11-2009  DR [facility]  . Left thumb carpometacarpal joint suspensionplasty  04-06-2006  DR Burney Gauze  . Excision right breast mass   10-20-2005  DR Collier Salina [facility]  . Left wrist arthroscopy w/ debridement and  removal cyst  03-26-2004  DR Burney Gauze  . Cholecystectomy  1992  . Breast biopsy  08-13-2009  DR TSUEI    EXCISION LEFT NIPPLE DUCT  . Colon resection  1986    ULCERATIVE COLITIS  . Esophageal manometry N/A 06/11/2013    Procedure: ESOPHAGEAL MANOMETRY (EM);  Surgeon: Jeryl Columbia, MD;  Location: [facility];  Service: Endoscopy;  Laterality: N/A;     Current Outpatient Prescriptions  Medication Sig Dispense Refill  . aspirin EC 81 MG tablet Take 81 mg by mouth daily.    . Cholecalciferol (VITAMIN D PO) Take 4,000 Int'l Units by mouth daily. Twice a week    . cholestyramine (QUESTRAN) 4 GM/DOSE powder Take by mouth daily. Reported on 11/05/2015    . clonazePAM (KLONOPIN) 0.5 MG tablet Take 0.25 mg by mouth as needed.  0  . fluticasone (FLONASE) 50 MCG/ACT nasal spray USE 1-2 SPRAYS IN EACH NOSTRIL ONCE A  DAY  12  . furosemide (LASIX) 20 MG tablet Take 20 mg by mouth daily.     Marland Kitchen gabapentin (NEURONTIN) 300 MG capsule Take 300 mg by mouth at bedtime.  1  . glimepiride (AMARYL) 2 MG tablet Take 2 mg by mouth daily with breakfast.   0  . glucose blood (ACCU-CHEK AVIVA) test strip 1 each by Other route as needed for other. Use as instructed    . HYDROcodone-acetaminophen (NORCO/VICODIN) 5-325 MG tablet Take 1 tablet by mouth every 6 (six) hours as needed (pain). Reported on 11/05/2015  0  . metFORMIN (GLUCOPHAGE-XR) 500 MG 24 hr tablet Take 1,000 mg by mouth daily with breakfast.     . pantoprazole (PROTONIX) 40 MG tablet Take 40 mg by mouth 2 (two) times daily.     . quinapril (ACCUPRIL) 40 MG tablet 1 TABLET ONCE A DAY ORALLY  0  . ranitidine (ZANTAC) 150 MG tablet Take 150 mg by mouth 2 (two) times daily.    . rosuvastatin (CRESTOR) 20 MG tablet Take 1 tablet (20 mg total) by mouth daily. 90 tablet 3  . traMADol (ULTRAM) 50 MG tablet Take 2 tablets by mouth every 6 (six) hours as needed (pain).   2  . amLODipine (NORVASC) 5 MG tablet Take 1 tablet (5 mg total) by mouth daily. 30 tablet 11   No current facility-administered medications for this visit.    Allergies:   Percocet; Ciprocin-fluocin-procin; Penicillins; Sulfa antibiotics; Tape; and Tetanus toxoids    Social History:  Lauren [name]  reports that she quit smoking about 33 years ago. Her smoking use included Cigarettes. She has a 10 pack-year smoking history. She has never used smokeless tobacco. She reports that she does not drink alcohol or use illicit drugs.   Family History:  Lauren [name]'s family history includes Cancer in her father and maternal grandfather; Diabetes in her father, maternal grandfather, and maternal grandmother; Heart disease in her father, maternal grandfather, and maternal grandmother; Hypertension in her father and mother; Parkinson's disease in her mother; Stroke in her paternal grandfather and paternal grandmother.      ROS:  Please see Lauren history of present illness.   Otherwise, review of systems are positive for none.   All other systems are reviewed and negative.    PHYSICAL EXAM: VS:  BP 159/96 mmHg  Pulse 84  Ht 5' 4.5" (1.638 m)  Wt 103.375 kg (227 lb 14.4 oz)  BMI 38.53 kg/m2 , BMI Body mass index is 38.53 kg/(m^2). GENERAL:  Well appearing HEENT:  Pupils equal  round and reactive, fundi not visualized, oral mucosa unremarkable NECK:  No jugular venous distention, waveform within normal limits, carotid upstroke brisk and symmetric, no bruits, no thyromegaly LYMPHATICS:  No cervical adenopathy LUNGS:  Clear to auscultation bilaterally HEART:  RRR.  PMI not displaced or sustained,S1 and S2 within normal limits, no S3, no S4, no clicks, no rubs, no murmurs ABD:  Flat, positive bowel sounds normal in frequency in pitch, no bruits, no rebound, no guarding, no midline pulsatile mass, no hepatomegaly, no splenomegaly EXT:  2 plus pulses throughout, no edema, no cyanosis no clubbing SKIN:  No rashes no nodules NEURO:  Cranial nerves II through XII grossly intact, motor grossly intact throughout PSYCH:  Cognitively intact, oriented to person place and time   EKG:  EKG is ordered today. Lauren ekg ordered today demonstrates sinus rhythm rate 81 bpm.   Recent Labs: 07/30/2015: ALT 17; BUN 9; Creatinine, Ser 0.93; Hemoglobin 14.3; Platelets 310; Potassium 3.6; Sodium 138    Lipid Panel No results found for: CHOL, TRIG, HDL, CHOLHDL, VLDL, LDLCALC, LDLDIRECT    Wt Readings from Last 3 Encounters:  12/08/15 103.375 kg (227 lb 14.4 oz)  11/05/15 104.781 kg (231 lb)  09/12/15 104.826 kg (231 lb 1.6 oz)      ASSESSMENT AND PLAN:  # Chest pain:  Lauren [name] had atypical symptoms that are no longer occuring.  She has been unable to get stress testing due to back discomfort.  Given that she is no longer having Lauren pain, it is likely that this does not represent ischemia.  However, she has not been  exerting herself much, so it is hard to tell.  Ultimately, she will still need stress testing, but this can be deferred until her back is better or Lauren pain recurs.   Continue aspirin for now.  Lexiscan Cardiolite when able.   # Hypertension:  BP poorly-controlled.  This may also be due to pain.  However, he BP has been consistently above goal.   Continue quinipril and start amlodipine 5mg  daily.  # Hyperlipidemia: Continue rosuvastatin.  Check fasting lipids and CMP.   Current medicines are reviewed at length with Lauren [name] today.  Lauren [name] does not have concerns regarding medicines.  Lauren following changes have been made:  Start amlodipine 5 mg daily  Labs/ tests ordered today include:  No orders of Lauren defined types were placed in this encounter.     Disposition:   FU with Nasia Cannan C. Oval Linsey, MD in 6 months.    Signed, Sharol Harness, MD  12/08/2015 1:53 PM    Lauren [name]

## 2015-12-08 ENCOUNTER — Ambulatory Visit (INDEPENDENT_AMBULATORY_CARE_PROVIDER_SITE_OTHER): Payer: PPO | Admitting: Cardiovascular Disease

## 2015-12-08 ENCOUNTER — Encounter: Payer: Self-pay | Admitting: Cardiovascular Disease

## 2015-12-08 VITALS — BP 159/96 | HR 84 | Ht 64.5 in | Wt 227.9 lb

## 2015-12-08 DIAGNOSIS — R072 Precordial pain: Secondary | ICD-10-CM | POA: Diagnosis not present

## 2015-12-08 DIAGNOSIS — E785 Hyperlipidemia, unspecified: Secondary | ICD-10-CM

## 2015-12-08 DIAGNOSIS — I10 Essential (primary) hypertension: Secondary | ICD-10-CM | POA: Diagnosis not present

## 2015-12-08 MED ORDER — AMLODIPINE BESYLATE 5 MG PO TABS: 5.0000 mg | ORAL_TABLET | Freq: Every day | ORAL | Status: DC

## 2015-12-08 NOTE — Patient Instructions (Signed)
Dr Oval Linsey has recommended making the following medication changes: START Amlodipine 5 mg - take 1 tablet (5 mg total) by mouth daily  Dr Oval Linsey recommends that you schedule a follow-up appointment in 6 months. You will receive a reminder letter in the mail two months in advance. If you don't receive a letter, please call our office to schedule the follow-up appointment.  If you need a refill on your cardiac medications before your next appointment, please call your pharmacy.

## 2015-12-11 ENCOUNTER — Encounter: Payer: Self-pay | Admitting: Women's Health

## 2015-12-22 ENCOUNTER — Ambulatory Visit: Payer: Medicare Other | Admitting: Cardiovascular Disease

## 2015-12-31 ENCOUNTER — Encounter: Payer: Self-pay | Admitting: Gynecology

## 2016-01-01 ENCOUNTER — Encounter: Payer: Self-pay | Admitting: Women's Health

## 2016-01-07 DIAGNOSIS — M47816 Spondylosis without myelopathy or radiculopathy, lumbar region: Secondary | ICD-10-CM | POA: Diagnosis not present

## 2016-01-07 DIAGNOSIS — G894 Chronic pain syndrome: Secondary | ICD-10-CM | POA: Diagnosis not present

## 2016-01-07 DIAGNOSIS — Z79891 Long term (current) use of opiate analgesic: Secondary | ICD-10-CM | POA: Diagnosis not present

## 2016-01-07 DIAGNOSIS — M25551 Pain in right hip: Secondary | ICD-10-CM | POA: Diagnosis not present

## 2016-01-08 ENCOUNTER — Telehealth: Payer: Self-pay | Admitting: *Deleted

## 2016-01-08 DIAGNOSIS — Z79899 Other long term (current) drug therapy: Secondary | ICD-10-CM

## 2016-01-08 DIAGNOSIS — E785 Hyperlipidemia, unspecified: Secondary | ICD-10-CM

## 2016-01-08 NOTE — Telephone Encounter (Signed)
-----   Message from Skeet Latch, MD sent at 12/08/2015  1:54 PM EST ----- Please ask Ms. Mcfarlan to come back to the lab for fasting lipids and CMP when convenient for her.  This is for drug monitoring on rosuvastatin.

## 2016-01-08 NOTE — Telephone Encounter (Signed)
left message - would like labs done at her convenience - mailed lab slip and direction

## 2016-02-18 DIAGNOSIS — Z79899 Other long term (current) drug therapy: Secondary | ICD-10-CM | POA: Diagnosis not present

## 2016-02-18 DIAGNOSIS — M47816 Spondylosis without myelopathy or radiculopathy, lumbar region: Secondary | ICD-10-CM | POA: Diagnosis not present

## 2016-02-18 DIAGNOSIS — M25551 Pain in right hip: Secondary | ICD-10-CM | POA: Diagnosis not present

## 2016-02-18 DIAGNOSIS — G894 Chronic pain syndrome: Secondary | ICD-10-CM | POA: Diagnosis not present

## 2016-02-24 ENCOUNTER — Other Ambulatory Visit: Payer: Self-pay | Admitting: Obstetrics and Gynecology

## 2016-02-24 DIAGNOSIS — I10 Essential (primary) hypertension: Secondary | ICD-10-CM | POA: Diagnosis not present

## 2016-02-24 DIAGNOSIS — R938 Abnormal findings on diagnostic imaging of other specified body structures: Secondary | ICD-10-CM | POA: Diagnosis not present

## 2016-02-24 DIAGNOSIS — E1165 Type 2 diabetes mellitus with hyperglycemia: Secondary | ICD-10-CM | POA: Diagnosis not present

## 2016-02-24 DIAGNOSIS — E7800 Pure hypercholesterolemia, unspecified: Secondary | ICD-10-CM | POA: Diagnosis not present

## 2016-02-24 DIAGNOSIS — R9389 Abnormal findings on diagnostic imaging of other specified body structures: Secondary | ICD-10-CM

## 2016-02-25 DIAGNOSIS — E1165 Type 2 diabetes mellitus with hyperglycemia: Secondary | ICD-10-CM | POA: Diagnosis not present

## 2016-02-25 DIAGNOSIS — Z7984 Long term (current) use of oral hypoglycemic drugs: Secondary | ICD-10-CM | POA: Diagnosis not present

## 2016-02-25 DIAGNOSIS — E7800 Pure hypercholesterolemia, unspecified: Secondary | ICD-10-CM | POA: Diagnosis not present

## 2016-03-03 ENCOUNTER — Other Ambulatory Visit: Payer: PPO

## 2016-03-10 ENCOUNTER — Other Ambulatory Visit: Payer: PPO

## 2016-03-17 DIAGNOSIS — G894 Chronic pain syndrome: Secondary | ICD-10-CM | POA: Diagnosis not present

## 2016-03-17 DIAGNOSIS — M47816 Spondylosis without myelopathy or radiculopathy, lumbar region: Secondary | ICD-10-CM | POA: Diagnosis not present

## 2016-03-17 DIAGNOSIS — M25551 Pain in right hip: Secondary | ICD-10-CM | POA: Diagnosis not present

## 2016-03-17 DIAGNOSIS — Z79899 Other long term (current) drug therapy: Secondary | ICD-10-CM | POA: Diagnosis not present

## 2016-03-31 ENCOUNTER — Other Ambulatory Visit: Payer: Self-pay | Admitting: Gastroenterology

## 2016-03-31 DIAGNOSIS — K229 Disease of esophagus, unspecified: Secondary | ICD-10-CM | POA: Diagnosis not present

## 2016-03-31 DIAGNOSIS — K219 Gastro-esophageal reflux disease without esophagitis: Secondary | ICD-10-CM | POA: Diagnosis not present

## 2016-03-31 DIAGNOSIS — K5730 Diverticulosis of large intestine without perforation or abscess without bleeding: Secondary | ICD-10-CM | POA: Diagnosis not present

## 2016-03-31 DIAGNOSIS — Z800 Family history of malignant neoplasm of digestive organs: Secondary | ICD-10-CM | POA: Diagnosis not present

## 2016-03-31 DIAGNOSIS — D3A092 Benign carcinoid tumor of the stomach: Secondary | ICD-10-CM | POA: Diagnosis not present

## 2016-03-31 DIAGNOSIS — K317 Polyp of stomach and duodenum: Secondary | ICD-10-CM | POA: Diagnosis not present

## 2016-03-31 DIAGNOSIS — K449 Diaphragmatic hernia without obstruction or gangrene: Secondary | ICD-10-CM | POA: Diagnosis not present

## 2016-03-31 DIAGNOSIS — K5190 Ulcerative colitis, unspecified, without complications: Secondary | ICD-10-CM | POA: Diagnosis not present

## 2016-03-31 DIAGNOSIS — Z980 Intestinal bypass and anastomosis status: Secondary | ICD-10-CM | POA: Diagnosis not present

## 2016-04-15 ENCOUNTER — Other Ambulatory Visit: Payer: Self-pay | Admitting: Family Medicine

## 2016-04-15 DIAGNOSIS — D3A00 Benign carcinoid tumor of unspecified site: Secondary | ICD-10-CM

## 2016-04-21 ENCOUNTER — Other Ambulatory Visit: Payer: PPO

## 2016-04-23 ENCOUNTER — Ambulatory Visit
Admission: RE | Admit: 2016-04-23 | Discharge: 2016-04-23 | Disposition: A | Discharge status: Home / Self Care | Payer: PPO | Source: Ambulatory Visit | Attending: Family Medicine | Admitting: Family Medicine

## 2016-04-23 DIAGNOSIS — D3A00 Benign carcinoid tumor of unspecified site: Secondary | ICD-10-CM

## 2016-04-23 DIAGNOSIS — R918 Other nonspecific abnormal finding of lung field: Secondary | ICD-10-CM | POA: Diagnosis not present

## 2016-04-23 DIAGNOSIS — D3A092 Benign carcinoid tumor of the stomach: Secondary | ICD-10-CM | POA: Diagnosis not present

## 2016-04-23 MED ORDER — IOPAMIDOL (ISOVUE-300) INJECTION 61%
125.0000 mL | Freq: Once | INTRAVENOUS | Status: AC | PRN
  Administered 2016-04-23: 125 mL via INTRAVENOUS

## 2016-05-17 DIAGNOSIS — Z79899 Other long term (current) drug therapy: Secondary | ICD-10-CM | POA: Diagnosis not present

## 2016-05-17 DIAGNOSIS — M25551 Pain in right hip: Secondary | ICD-10-CM | POA: Diagnosis not present

## 2016-05-17 DIAGNOSIS — M47816 Spondylosis without myelopathy or radiculopathy, lumbar region: Secondary | ICD-10-CM | POA: Diagnosis not present

## 2016-05-17 DIAGNOSIS — G894 Chronic pain syndrome: Secondary | ICD-10-CM | POA: Diagnosis not present

## 2016-05-26 DIAGNOSIS — Z7984 Long term (current) use of oral hypoglycemic drugs: Secondary | ICD-10-CM | POA: Diagnosis not present

## 2016-05-26 DIAGNOSIS — R911 Solitary pulmonary nodule: Secondary | ICD-10-CM | POA: Diagnosis not present

## 2016-05-26 DIAGNOSIS — E1165 Type 2 diabetes mellitus with hyperglycemia: Secondary | ICD-10-CM | POA: Diagnosis not present

## 2016-05-27 ENCOUNTER — Encounter (HOSPITAL_COMMUNITY): Payer: Self-pay | Admitting: *Deleted

## 2016-05-28 ENCOUNTER — Encounter (HOSPITAL_COMMUNITY): Payer: Self-pay | Admitting: *Deleted

## 2016-05-28 ENCOUNTER — Other Ambulatory Visit: Payer: Self-pay | Admitting: Gastroenterology

## 2016-05-31 ENCOUNTER — Encounter (HOSPITAL_COMMUNITY): Payer: Self-pay | Admitting: Certified Registered"

## 2016-05-31 ENCOUNTER — Ambulatory Visit (HOSPITAL_COMMUNITY): Payer: PPO | Admitting: Certified Registered"

## 2016-05-31 ENCOUNTER — Ambulatory Visit (HOSPITAL_COMMUNITY)
Admission: RE | Admit: 2016-05-31 | Discharge: 2016-05-31 | Disposition: A | Discharge status: Home / Self Care | Payer: PPO | Source: Ambulatory Visit | Attending: Gastroenterology | Admitting: Gastroenterology

## 2016-05-31 ENCOUNTER — Encounter (HOSPITAL_COMMUNITY)
Admission: RE | Disposition: A | Discharge status: Home / Self Care | Payer: Self-pay | Source: Ambulatory Visit | Attending: Gastroenterology

## 2016-05-31 DIAGNOSIS — K449 Diaphragmatic hernia without obstruction or gangrene: Secondary | ICD-10-CM | POA: Insufficient documentation

## 2016-05-31 DIAGNOSIS — E119 Type 2 diabetes mellitus without complications: Secondary | ICD-10-CM | POA: Insufficient documentation

## 2016-05-31 DIAGNOSIS — Z87891 Personal history of nicotine dependence: Secondary | ICD-10-CM | POA: Diagnosis not present

## 2016-05-31 DIAGNOSIS — K2950 Unspecified chronic gastritis without bleeding: Secondary | ICD-10-CM | POA: Diagnosis not present

## 2016-05-31 DIAGNOSIS — D3A8 Other benign neuroendocrine tumors: Secondary | ICD-10-CM | POA: Diagnosis not present

## 2016-05-31 DIAGNOSIS — K317 Polyp of stomach and duodenum: Secondary | ICD-10-CM | POA: Insufficient documentation

## 2016-05-31 DIAGNOSIS — I10 Essential (primary) hypertension: Secondary | ICD-10-CM | POA: Insufficient documentation

## 2016-05-31 DIAGNOSIS — K219 Gastro-esophageal reflux disease without esophagitis: Secondary | ICD-10-CM | POA: Insufficient documentation

## 2016-05-31 DIAGNOSIS — D3A092 Benign carcinoid tumor of the stomach: Secondary | ICD-10-CM | POA: Diagnosis not present

## 2016-05-31 HISTORY — DX: Family history of other specified conditions: Z84.89

## 2016-05-31 HISTORY — DX: Unspecified thoracic, thoracolumbar and lumbosacral intervertebral disc disorder: M51.9

## 2016-05-31 HISTORY — DX: Type 2 diabetes mellitus without complications: E11.9

## 2016-05-31 HISTORY — PX: ESOPHAGOGASTRODUODENOSCOPY (EGD) WITH PROPOFOL: SHX5813

## 2016-05-31 LAB — GLUCOSE, CAPILLARY: GLUCOSE-CAPILLARY: 173 mg/dL — AB (ref 65–99)

## 2016-05-31 SURGERY — ESOPHAGOGASTRODUODENOSCOPY (EGD) WITH PROPOFOL
Anesthesia: Monitor Anesthesia Care

## 2016-05-31 MED ORDER — LIDOCAINE HCL (CARDIAC) 20 MG/ML IV SOLN
INTRAVENOUS | Status: DC | PRN
  Administered 2016-05-31: 40 mg via INTRAVENOUS

## 2016-05-31 MED ORDER — BUTAMBEN-TETRACAINE-BENZOCAINE 2-2-14 % EX AERO
INHALATION_SPRAY | CUTANEOUS | Status: DC | PRN
  Administered 2016-05-31: 2 via TOPICAL

## 2016-05-31 MED ORDER — SODIUM CHLORIDE 0.9 % IV SOLN: INTRAVENOUS | Status: DC

## 2016-05-31 MED ORDER — PROPOFOL 500 MG/50ML IV EMUL
INTRAVENOUS | Status: DC | PRN
  Administered 2016-05-31: 125 ug/kg/min via INTRAVENOUS

## 2016-05-31 MED ORDER — LACTATED RINGERS IV SOLN
INTRAVENOUS | Status: DC
  Administered 2016-05-31: 1000 mL via INTRAVENOUS

## 2016-05-31 MED ORDER — LACTATED RINGERS IV SOLN
INTRAVENOUS | Status: DC | PRN
  Administered 2016-05-31: 09:00:00 via INTRAVENOUS

## 2016-05-31 MED ORDER — PROPOFOL 10 MG/ML IV BOLUS
INTRAVENOUS | Status: DC | PRN
  Administered 2016-05-31 (×3): 10 mg via INTRAVENOUS

## 2016-05-31 NOTE — Discharge Instructions (Addendum)
Call if question or problem otherwise call for biopsy report in 1 week and we will decide follow-up based on that although probably repeat endoscopy in one year and follow-up per our routine in 6 months and okay to resume aspirin in 3 days   Esophagogastroduodenoscopy, Care After Refer to this sheet in the next few weeks. These instructions provide you with information about caring for yourself after your procedure. Your health care provider may also give you more specific instructions. Your treatment has been planned according to current medical practices, but problems sometimes occur. Call your health care provider if you have any problems or questions after your procedure. WHAT TO EXPECT AFTER THE PROCEDURE After your procedure, it is typical to feel:  Soreness in your throat.  Pain with swallowing.  Sick to your stomach (nauseous).  Bloated.  Dizzy.  Fatigued. HOME CARE INSTRUCTIONS  Do not eat or drink anything until the numbing medicine (local anesthetic) has worn off and your gag reflex has returned. You will know that the local anesthetic has worn off when you can swallow comfortably.  Do not drive or operate machinery until directed by your health care provider.  Take medicines only as directed by your health care provider. SEEK MEDICAL CARE IF:   You cannot stop coughing.  You are not urinating at all or less than usual. SEEK IMMEDIATE MEDICAL CARE IF:  You have difficulty swallowing.  You cannot eat or drink.  You have worsening throat or chest pain.  You have dizziness or lightheadedness or you faint.  You have nausea or vomiting.  You have chills.  You have a fever.  You have severe abdominal pain.  You have black, tarry, or bloody stools.   This information is not intended to replace advice given to you by your health care provider. Make sure you discuss any questions you have with your health care provider.   Document Released: 10/04/2012 Document  Revised: 11/08/2014 Document Reviewed: 10/04/2012 Elsevier Interactive Patient Education 2016 [place] Monitored anesthesia care is an anesthesia service for a medical procedure. Anesthesia is the loss of the ability to feel pain. It is produced by medicines called anesthetics. It may affect a small area of your body (local anesthesia), a large area of your body (regional anesthesia), or your entire body (general anesthesia). The need for monitored anesthesia care depends your procedure, your condition, and the potential need for regional or general anesthesia. It is often provided during procedures where:   General anesthesia may be needed if there are complications. This is because you need special care when you are under general anesthesia.   You will be under local or regional anesthesia. This is so that you are able to have higher levels of anesthesia if needed.   You will receive calming medicines (sedatives). This is especially the case if sedatives are given to put you in a semi-conscious state of relaxation (deep sedation). This is because the amount of sedative needed to produce this state can be hard to predict. Too much of a sedative can produce general anesthesia. Monitored anesthesia care is performed by one or more health care providers who have special training in all types of anesthesia. You will need to meet with these health care providers before your procedure. During this meeting, they will ask you about your medical history. They will also give you instructions to follow. (For example, you will need to stop eating and drinking before your procedure. You may also  need to stop or change medicines you are taking.) During your procedure, your health care providers will stay with you. They will:   Watch your condition. This includes watching your blood pressure, breathing, and level of pain.   Diagnose and treat problems that occur.   Give  medicines if they are needed. These may include calming medicines (sedatives) and anesthetics.   Make sure you are comfortable.  Having monitored anesthesia care does not necessarily mean that you will be under anesthesia. It does mean that your health care providers will be able to manage anesthesia if you need it or if it occurs. It also means that you will be able to have a different type of anesthesia than you are having if you need it. When your procedure is complete, your health care providers will continue to watch your condition. They will make sure any medicines wear off before you are allowed to go home.    This information is not intended to replace advice given to you by your health care provider. Make sure you discuss any questions you have with your health care provider.   Document Released: 07/14/2005 Document Revised: 11/08/2014 Document Reviewed: 11/29/2012 Elsevier Interactive Patient Education [employer].

## 2016-05-31 NOTE — Transfer of Care (Signed)
Immediate Anesthesia Transfer of Care Note  Patient: Lauren [name]  Procedure(s) Performed: Procedure(s): ESOPHAGOGASTRODUODENOSCOPY (EGD) WITH PROPOFOL (N/A) HOT HEMOSTASIS (ARGON PLASMA COAGULATION/BICAP) (N/A)  Patient Location: Endoscopy Unit  Anesthesia Type:MAC  Level of Consciousness: awake, alert  and oriented  Airway & Oxygen Therapy: Patient Spontanous Breathing and Patient connected to nasal cannula oxygen  Post-op Assessment: Report given to RN, Post -op Vital signs reviewed and stable and Patient moving all extremities X 4  Post vital signs: Reviewed and stable  Last Vitals:  Vitals:   05/31/16 0813  BP: (!) 149/79  Resp: 20  Temp: 36.6 C    Last Pain:  Vitals:   05/31/16 0813  TempSrc: Oral         Complications: No apparent anesthesia complications

## 2016-05-31 NOTE — Anesthesia Preprocedure Evaluation (Signed)
Anesthesia Evaluation  Patient identified by MRN, date of birth, ID band Patient awake    Reviewed: Allergy & Precautions, NPO status , Patient's Chart, lab work & pertinent test results  Airway Mallampati: II  TM Distance: >3 FB Neck ROM: Full    Dental no notable dental hx.    Pulmonary neg pulmonary ROS, former smoker,    Pulmonary exam normal breath sounds clear to auscultation       Cardiovascular hypertension, Pt. on medications Normal cardiovascular exam Rhythm:Regular Rate:Normal     Neuro/Psych negative neurological ROS  negative psych ROS   GI/Hepatic Neg liver ROS, GERD  ,  Endo/Other  diabetes  Renal/GU negative Renal ROS  negative genitourinary   Musculoskeletal negative musculoskeletal ROS (+)   Abdominal   Peds negative pediatric ROS (+)  Hematology negative hematology ROS (+)   Anesthesia Other Findings   Reproductive/Obstetrics negative OB ROS                             Anesthesia Physical Anesthesia Plan  ASA: II  Anesthesia Plan: MAC   Post-op Pain Management:    Induction: Intravenous  Airway Management Planned: Nasal Cannula  Additional Equipment:   Intra-op Plan:   Post-operative Plan:   Informed Consent: I have reviewed the patients History and Physical, chart, labs and discussed the procedure including the risks, benefits and alternatives for the proposed anesthesia with the patient or authorized representative who has indicated his/her understanding and acceptance.   Dental advisory given  Plan Discussed with: CRNA and Surgeon  Anesthesia Plan Comments:         Anesthesia Quick Evaluation

## 2016-05-31 NOTE — Anesthesia Postprocedure Evaluation (Signed)
Anesthesia Post Note  Patient: Lauren [name]  Procedure(s) Performed: Procedure(s) (LRB): ESOPHAGOGASTRODUODENOSCOPY (EGD) WITH PROPOFOL (N/A) HOT HEMOSTASIS (ARGON PLASMA COAGULATION/BICAP) (N/A)  Patient location during evaluation: PACU Anesthesia Type: MAC Level of consciousness: awake and alert Pain management: pain level controlled Vital Signs Assessment: post-procedure vital signs reviewed and stable Respiratory status: spontaneous breathing, nonlabored ventilation, respiratory function stable and patient connected to nasal cannula oxygen Cardiovascular status: stable and blood pressure returned to baseline Anesthetic complications: no    Last Vitals:  Vitals:   05/31/16 0813 05/31/16 0935  BP: (!) 149/79 113/67  Resp: 20 13  Temp: 36.6 C 36.4 C    Last Pain:  Vitals:   05/31/16 0813  TempSrc: Oral                 Keshona Kartes S

## 2016-05-31 NOTE — Op Note (Signed)
[facility] Patient Name: Annasofia Felmlee Procedure Date : 05/31/2016 MRN: [id number] Attending MD: Clarene Essex , MD Date of Birth: [birth year] CSN: [id number] Age: 70 Admit Type: Outpatient Procedure:                Upper GI endoscopy Indications:              Gastric polyps Providers:                Clarene Essex, MD, Sarah Monday RN, RN, Elspeth Cho                            Tech., Technician, Phill Myron. Proofreader, CRNA Referring MD:              Medicines:                Propofol total dose 220 mg IV, Cetacaine spray Complications:            No immediate complications. Estimated Blood Loss:     Estimated blood loss: none. Procedure:                Pre-Anesthesia Assessment:                           - Prior to the procedure, a History and Physical                            was performed, and patient medications and                            allergies were reviewed. The patient's tolerance of                            previous anesthesia was also reviewed. The risks                            and benefits of the procedure and the sedation                            options and risks were discussed with the patient.                            All questions were answered, and informed consent                            was obtained. Prior Anticoagulants: The patient has                            taken aspirin, last dose was 5 days prior to                            procedure. ASA Grade Assessment: II - A patient                            with mild systemic disease. After reviewing the  risks and benefits, the patient was deemed in                            satisfactory condition to undergo the procedure.                           After obtaining informed consent, the endoscope was                            passed under direct vision. Throughout the                            procedure, the patient's blood pressure, pulse, and                       oxygen saturations were monitored continuously. The                            [id number]) scope was introduced through the                            mouth, and advanced to the third part of duodenum.                            The upper GI endoscopy was accomplished without                            difficulty. The patient tolerated the procedure                            well. Scope In: Scope Out: Findings:      The larynx was normal.      A small hiatal hernia was present.      A single medium semi-sessile polyp with no bleeding and no stigmata of       recent bleeding was found on the greater curvature of the stomach. The       polyp was removed with a hot snare. Resection and retrieval were       complete.      Localized mild inflammation characterized by erythema was found in the       gastric antrum.      The duodenal bulb, first portion of the duodenum, second portion of the       duodenum and third portion of the duodenum were normal.      The exam was otherwise without abnormality. Impression:               - Normal larynx.                           - Small hiatal hernia.                           - A single gastric polyp. Resected and retrieved.                           - Chronic gastritis.                           -  Normal duodenal bulb, first portion of the                            duodenum, second portion of the duodenum and third                            portion of the duodenum.                           - The examination was otherwise normal. Moderate Sedation:      moderate sedation-none Recommendation:           - Patient has a contact number available for                            emergencies. The signs and symptoms of potential                            delayed complications were discussed with the                            patient. Return to normal activities tomorrow.                            Written discharge  instructions were provided to the                            patient.                           - Soft diet today.                           - Continue present medications.                           - No aspirin, ibuprofen, naproxen, or other                            non-steroidal anti-inflammatory drugs for 3 days                            after polyp removal.                           - Await pathology results.                           - Return to GI clinic PRN.                           - Telephone GI clinic for pathology results in 1                            week.                           -  Telephone GI clinic if symptomatic PRN. Procedure Code(s):        --- Professional ---                           [phone], Esophagogastroduodenoscopy, flexible,                            transoral; with removal of tumor(s), polyp(s), or                            other lesion(s) by snare technique Diagnosis Code(s):        --- Professional ---                           K44.9, Diaphragmatic hernia without obstruction or                            gangrene                           K31.7, Polyp of stomach and duodenum                           K29.50, Unspecified chronic gastritis without                            bleeding CPT copyright 2016 American Medical Association. All rights reserved. The codes documented in this report are preliminary and upon coder review may  be revised to meet current compliance requirements. Clarene Essex, MD 05/31/2016 9:52:19 AM This report has been signed electronically. Number of Addenda: 0

## 2016-05-31 NOTE — Progress Notes (Signed)
Lauren [name] 9:11 AM  Subjective: Patient without any new complaints since we saw her recently in the office and we answered all of her and her husband's questions and she stopped her aspirin on Wednesday and we reviewed her CT scan  Objective: Vital signs stable afebrile no acute distress exam please see preassessment evaluation CT reviewed as above  Assessment: Carcinoid polyp of the stomach  Plan: Okay to proceed with attempts at polypectomy with anesthesia assistance today  [facility]  Pager [phone] After 5PM or if no answer call [phone]

## 2016-06-01 ENCOUNTER — Encounter (HOSPITAL_COMMUNITY): Payer: Self-pay | Admitting: Gastroenterology

## 2016-07-08 DIAGNOSIS — K644 Residual hemorrhoidal skin tags: Secondary | ICD-10-CM | POA: Diagnosis not present

## 2016-07-09 DIAGNOSIS — Z79899 Other long term (current) drug therapy: Secondary | ICD-10-CM | POA: Diagnosis not present

## 2016-07-09 DIAGNOSIS — M25551 Pain in right hip: Secondary | ICD-10-CM | POA: Diagnosis not present

## 2016-07-09 DIAGNOSIS — Z79891 Long term (current) use of opiate analgesic: Secondary | ICD-10-CM | POA: Diagnosis not present

## 2016-07-09 DIAGNOSIS — G894 Chronic pain syndrome: Secondary | ICD-10-CM | POA: Diagnosis not present

## 2016-07-09 DIAGNOSIS — M47816 Spondylosis without myelopathy or radiculopathy, lumbar region: Secondary | ICD-10-CM | POA: Diagnosis not present

## 2016-07-20 DIAGNOSIS — Z23 Encounter for immunization: Secondary | ICD-10-CM | POA: Diagnosis not present

## 2016-08-03 ENCOUNTER — Ambulatory Visit (INDEPENDENT_AMBULATORY_CARE_PROVIDER_SITE_OTHER): Payer: PPO | Admitting: Orthopaedic Surgery

## 2016-08-10 ENCOUNTER — Ambulatory Visit (INDEPENDENT_AMBULATORY_CARE_PROVIDER_SITE_OTHER): Payer: PPO | Admitting: Orthopaedic Surgery

## 2016-08-10 DIAGNOSIS — M47896 Other spondylosis, lumbar region: Secondary | ICD-10-CM | POA: Diagnosis not present

## 2016-08-10 DIAGNOSIS — M79672 Pain in left foot: Secondary | ICD-10-CM | POA: Diagnosis not present

## 2016-08-10 DIAGNOSIS — M25551 Pain in right hip: Secondary | ICD-10-CM | POA: Diagnosis not present

## 2016-08-11 DIAGNOSIS — M79672 Pain in left foot: Secondary | ICD-10-CM | POA: Diagnosis not present

## 2016-08-20 DIAGNOSIS — M25551 Pain in right hip: Secondary | ICD-10-CM | POA: Diagnosis not present

## 2016-08-20 DIAGNOSIS — M47816 Spondylosis without myelopathy or radiculopathy, lumbar region: Secondary | ICD-10-CM | POA: Diagnosis not present

## 2016-08-20 DIAGNOSIS — Z79899 Other long term (current) drug therapy: Secondary | ICD-10-CM | POA: Diagnosis not present

## 2016-08-20 DIAGNOSIS — G894 Chronic pain syndrome: Secondary | ICD-10-CM | POA: Diagnosis not present

## 2016-08-23 ENCOUNTER — Telehealth (INDEPENDENT_AMBULATORY_CARE_PROVIDER_SITE_OTHER): Payer: Self-pay | Admitting: Orthopaedic Surgery

## 2016-08-23 NOTE — Telephone Encounter (Signed)
Pt is still experiencing pain when she doesn't wear her boot, swelling is better. Pt wondering if there is some sort of brace she can wear in her shoe to help with her gait.

## 2016-08-24 NOTE — Telephone Encounter (Signed)
TOLD HER TO USE SUPPORT STOCKING WHEN SHE WEANS OUT OF BOOT.  IF PAIN RETURNS THEN SHE will call and get an MRI scan

## 2016-08-26 ENCOUNTER — Other Ambulatory Visit: Payer: Self-pay | Admitting: Gynecology

## 2016-08-26 DIAGNOSIS — Z0000 Encounter for general adult medical examination without abnormal findings: Secondary | ICD-10-CM | POA: Diagnosis not present

## 2016-08-26 DIAGNOSIS — K219 Gastro-esophageal reflux disease without esophagitis: Secondary | ICD-10-CM | POA: Diagnosis not present

## 2016-08-26 DIAGNOSIS — E7800 Pure hypercholesterolemia, unspecified: Secondary | ICD-10-CM | POA: Diagnosis not present

## 2016-08-26 DIAGNOSIS — Z1231 Encounter for screening mammogram for malignant neoplasm of breast: Secondary | ICD-10-CM

## 2016-08-26 DIAGNOSIS — Z7984 Long term (current) use of oral hypoglycemic drugs: Secondary | ICD-10-CM | POA: Diagnosis not present

## 2016-08-26 DIAGNOSIS — M25559 Pain in unspecified hip: Secondary | ICD-10-CM | POA: Diagnosis not present

## 2016-08-26 DIAGNOSIS — I10 Essential (primary) hypertension: Secondary | ICD-10-CM | POA: Diagnosis not present

## 2016-08-26 DIAGNOSIS — R351 Nocturia: Secondary | ICD-10-CM | POA: Diagnosis not present

## 2016-08-26 DIAGNOSIS — J309 Allergic rhinitis, unspecified: Secondary | ICD-10-CM | POA: Diagnosis not present

## 2016-08-26 DIAGNOSIS — E1165 Type 2 diabetes mellitus with hyperglycemia: Secondary | ICD-10-CM | POA: Diagnosis not present

## 2016-08-26 DIAGNOSIS — E559 Vitamin D deficiency, unspecified: Secondary | ICD-10-CM | POA: Diagnosis not present

## 2016-09-03 DIAGNOSIS — I10 Essential (primary) hypertension: Secondary | ICD-10-CM | POA: Diagnosis not present

## 2016-09-03 DIAGNOSIS — E7800 Pure hypercholesterolemia, unspecified: Secondary | ICD-10-CM | POA: Diagnosis not present

## 2016-09-03 DIAGNOSIS — E1165 Type 2 diabetes mellitus with hyperglycemia: Secondary | ICD-10-CM | POA: Diagnosis not present

## 2016-09-03 DIAGNOSIS — E559 Vitamin D deficiency, unspecified: Secondary | ICD-10-CM | POA: Diagnosis not present

## 2016-09-03 DIAGNOSIS — Z0000 Encounter for general adult medical examination without abnormal findings: Secondary | ICD-10-CM | POA: Diagnosis not present

## 2016-09-03 DIAGNOSIS — K219 Gastro-esophageal reflux disease without esophagitis: Secondary | ICD-10-CM | POA: Diagnosis not present

## 2016-09-03 DIAGNOSIS — J309 Allergic rhinitis, unspecified: Secondary | ICD-10-CM | POA: Diagnosis not present

## 2016-09-16 ENCOUNTER — Ambulatory Visit: Payer: PPO

## 2016-09-20 ENCOUNTER — Ambulatory Visit (INDEPENDENT_AMBULATORY_CARE_PROVIDER_SITE_OTHER): Payer: PPO | Admitting: Orthopaedic Surgery

## 2016-09-20 ENCOUNTER — Ambulatory Visit (INDEPENDENT_AMBULATORY_CARE_PROVIDER_SITE_OTHER): Payer: PPO

## 2016-09-20 DIAGNOSIS — M25551 Pain in right hip: Secondary | ICD-10-CM

## 2016-09-20 DIAGNOSIS — M1611 Unilateral primary osteoarthritis, right hip: Secondary | ICD-10-CM

## 2016-09-20 NOTE — Progress Notes (Signed)
Office Visit Note   Patient: Lauren [name]           Date of Birth: 1945-12-11           MRN: [id number] Visit Date: 09/20/2016              Requested by: Lawerance Cruel, MD [place], [place] [id number] PCP: Melinda Crutch, MD   Assessment & Plan: Visit Diagnoses:  1. Pain in right hip   2. Pain of right hip joint   3. Unilateral primary osteoarthritis, right hip     Plan: I did share with her her x-rays and talk in length about her physical exam in about my recommendation for total hip replacement. She is tried and failed all forms conservative treatment including activity modification and rest anti-inflammatories and using assistive device as well as pain medications. This is now detrimentally affected her quality of life, her mobility and, and her activity is daily living. Her pain is 10 out of 10. I do feel with total hip arthroplasty with our goals would be decreased pain improve mobility and overall poor quality of life. She understands the risk of acute blood loss anemia, nerve and vessel injury, fracture, infection, DVT. She understands our goals are decreased pain and and improve mobility. She does wish to have this surgery set up sometime in the near future. I gave her handout on hip replacement surgery and we'll work on getting this scheduled. We would then see her back in 2 weeks postoperative but no x-rays of the needed. Spends cancer monitor, with her showing her hip model as well as.  Follow-Up Instructions: Return `.   Orders:  Orders Placed This Encounter  Procedures  . XR HIP UNILAT W OR W/O PELVIS 1V RIGHT   No orders of the defined types were placed in this encounter.     Procedures: No procedures performed   Clinical Data: No additional findings.   Subjective: Chief Complaint  Patient presents with  . Right Hip - Pain    Pain for 1+ year, thought this was her back, but her PCP really thinks her hip needs to be checked out. She see's  Dr. Hardin Negus pain management for her back.     HPI Lauren [name] comes in with a history of right hip pain in the groin is been going on for over a year. She's been on chronic pain medication due to this. She walks with an assistive device. She's tried and failed all forms conservative treatment including activity modification and weight loss. Her pain is 10 out of 10 this point. Review of Systems She denies any headache, shortness of breath, chest pain, fever, chills, nausea, vomiting.  Objective: Vital Signs: There were no vitals taken for this visit.  Physical Exam She is alert and oriented 3 in no acute distress robs discomfort Ortho Exam Examination of her left hip is normal. Examination of her right hip shows that she cannot cross her leg. She has severe pain in her right knee and stiffness. Her hip shows significant stiffness with internal rotation rotation as well as severe pain with internal/external rotation. Specialty Comments:  No specialty comments available.  Imaging: Xr Hip Unilat W Or W/o Pelvis 1v Right  Result Date: 09/20/2016 An AP pelvis and a lateral of her right hip shows significant arthritis of the right hip. There is almost complete loss of the superior lateral joint space. This para-articular osteophytes and sclerotic changes as well as cystic  changes consistent with severe osteoarthritis of the right hip    PMFS History: Patient Active Problem List   Diagnosis Date Noted  . Hiatal hernia 04/13/2013  . Type II or unspecified type diabetes mellitus without mention of complication, uncontrolled 01/26/2013  . Umbilical hernia [id number]  . Hypertension    Past Medical History:  Diagnosis Date  . Arthritis    Back, Hip- right  . Diabetes mellitus without complication ([place])    Type II  . Endometriosis   . Family history of adverse reaction to anesthesia    Father - N/V - blood pressure  . GERD (gastroesophageal reflux disease)   . History of kidney  stones   . History of ulcerative colitis   . Hypertension   . Insomnia   . 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 Neuropathy ([place])   . Pneumonia    1983, 2003  . Umbilical hernia   . Vitamin D deficiency     Family History  Problem Relation Age of Onset  . Hypertension Father   . Heart disease Father   . Diabetes Father   . Cancer Father     colon, prostate, and kidney  . Hypertension Mother   . Parkinson's disease Mother   . Heart disease Maternal Grandmother   . Diabetes Maternal Grandmother   . Heart disease Maternal Grandfather   . Diabetes Maternal Grandfather   . Cancer Maternal Grandfather     pt unaware of what kind  . Stroke Paternal Grandmother   . Stroke Paternal Grandfather     Past Surgical History:  Procedure Laterality Date  . BREAST BIOPSY  08-13-2009  DR TSUEI   EXCISION LEFT NIPPLE DUCT  . CHOLECYSTECTOMY  1992  . COLON RESECTION  1986   ULCERATIVE COLITIS  . COLON SURGERY    . ESOPHAGEAL MANOMETRY N/A 06/11/2013   Procedure: ESOPHAGEAL MANOMETRY (EM);  Surgeon: Jeryl Columbia, MD;  Location: [facility] ENDOSCOPY;  Service: Endoscopy;  Laterality: N/A;  . ESOPHAGOGASTRODUODENOSCOPY (EGD) WITH PROPOFOL N/A 05/31/2016   Procedure: ESOPHAGOGASTRODUODENOSCOPY (EGD) WITH PROPOFOL;  Surgeon: Clarene Essex, MD;  Location: [facility] ENDOSCOPY;  Service: Endoscopy;  Laterality: N/A;  . EXCISION RIGHT BREAST MASS   10-20-2005  DR Marylene Buerger  . kidney stone removed  2/11  . KNEE ARTHROSCOPY Left    MCL  . LEFT THUMB CARPOMETACARPAL JOINT SUSPENSIONPLASTY  04-06-2006  DR Burney Gauze  . LEFT WRIST ARTHROSCOPY W/ DEBRIDEMENT AND REMOVAL CYST  03-26-2004  DR Burney Gauze  . MYOMECTOMY    . RIGHT COLECTOMY    . RIGHT URETEROSCOPIC STONE EXTRACTION  12-11-2009  DR Irine Seal  . TENDON RELEASE    . WRIST SURGERY     both   Social History   Occupational History  . Not on file.   Social History Main Topics  . Smoking status: Former Smoker    Packs/day: 1.00    Years: 10.00    Types:  Cigarettes    Quit date: 09/22/1982  . Smokeless tobacco: Never Used     Comment: quit 1983  . Alcohol use No  . Drug use: No  . Sexual activity: Yes    Partners: Male    Birth control/ protection: Post-menopausal

## 2016-09-30 ENCOUNTER — Telehealth (INDEPENDENT_AMBULATORY_CARE_PROVIDER_SITE_OTHER): Payer: Self-pay | Admitting: Orthopaedic Surgery

## 2016-09-30 NOTE — Telephone Encounter (Signed)
She can have dental work before surgery and no antibiotics are needed.

## 2016-09-30 NOTE — Telephone Encounter (Signed)
Patient aware of the below message  

## 2016-09-30 NOTE — Telephone Encounter (Signed)
Ms. Lauren [name] called saying she has a question regarding having dental work performed before surgery. She's not yet scheduled for surgery but wants to have it in January. The dental work would be a deep cleaning she said. Please give her a phone call regarding this.  Pt's ph# [phone] Thank you.

## 2016-10-04 ENCOUNTER — Other Ambulatory Visit: Payer: Self-pay | Admitting: Cardiovascular Disease

## 2016-10-04 DIAGNOSIS — E785 Hyperlipidemia, unspecified: Secondary | ICD-10-CM

## 2016-10-05 NOTE — Telephone Encounter (Signed)
Review for refill. 

## 2016-10-05 NOTE — Telephone Encounter (Signed)
Rx(s) sent to pharmacy electronically.  

## 2016-10-13 ENCOUNTER — Telehealth (INDEPENDENT_AMBULATORY_CARE_PROVIDER_SITE_OTHER): Payer: Self-pay | Admitting: Orthopaedic Surgery

## 2016-10-13 NOTE — Telephone Encounter (Signed)
Patient seen Nov 20th by Lauren [name] and discussed having total R hip surgery.  She would like to schedule something toward the end of Jan or first part of Feb.  She will have help (her daughter)  during this time of recovery.

## 2016-10-15 DIAGNOSIS — G894 Chronic pain syndrome: Secondary | ICD-10-CM | POA: Diagnosis not present

## 2016-10-15 DIAGNOSIS — M25551 Pain in right hip: Secondary | ICD-10-CM | POA: Diagnosis not present

## 2016-10-15 DIAGNOSIS — Z79899 Other long term (current) drug therapy: Secondary | ICD-10-CM | POA: Diagnosis not present

## 2016-10-15 DIAGNOSIS — M47816 Spondylosis without myelopathy or radiculopathy, lumbar region: Secondary | ICD-10-CM | POA: Diagnosis not present

## 2016-11-03 NOTE — Telephone Encounter (Signed)
I called patient and scheduled surgery. 

## 2016-11-09 NOTE — Progress Notes (Signed)
Scheduling pre op- please PLACE SURGICAL ORDERS IN EPIC  Thanks 

## 2016-11-12 ENCOUNTER — Other Ambulatory Visit (INDEPENDENT_AMBULATORY_CARE_PROVIDER_SITE_OTHER): Payer: Self-pay | Admitting: Physician Assistant

## 2016-11-15 ENCOUNTER — Encounter: Payer: Self-pay | Admitting: Physician Assistant

## 2016-11-15 ENCOUNTER — Ambulatory Visit (INDEPENDENT_AMBULATORY_CARE_PROVIDER_SITE_OTHER): Payer: PPO | Admitting: Physician Assistant

## 2016-11-15 VITALS — BP 144/88 | HR 79 | Ht 64.0 in | Wt 220.8 lb

## 2016-11-15 DIAGNOSIS — I10 Essential (primary) hypertension: Secondary | ICD-10-CM

## 2016-11-15 DIAGNOSIS — Z01818 Encounter for other preprocedural examination: Secondary | ICD-10-CM

## 2016-11-15 DIAGNOSIS — R0609 Other forms of dyspnea: Secondary | ICD-10-CM | POA: Diagnosis not present

## 2016-11-15 DIAGNOSIS — R0600 Dyspnea, unspecified: Secondary | ICD-10-CM

## 2016-11-15 NOTE — Progress Notes (Signed)
Cardiology Office Note   Date:  11/15/2016   ID:  Lauren [name], DOB 06-13-1946, MRN [id number]  PCP:  Melinda Crutch, MD  Cardiologist:  Dr Oval Linsey 12/04/2015  Lauren Ferries, PA-C   Chief Complaint  Patient presents with  . Follow-up    1 year;  . Leg Pain    occassionally.    History of Present Illness: Lauren [name] is a 71 y.o. female with a history of DM, HTN, HLD. FH CAD, GERD, ulcerative colitis, neuropathy  12/2015, eval by Dr Oval Linsey for CP, MV ordered but never performed.  Lauren [name] presents for preop eval and yearly follow up.  She has been struggling with MS issues for a while. She has both back and hip issues. She is to have surgery on her hip later this month.   She walks with a cane. However, she cannot go very far because the pain gets worse the further she walks. She cannot stand for long periods of time. She walks with the dogs 4 x day around her property, that is the most strenuous thing that she does.   She never gets chest pain. She gets SOB occasionally, is aware that she is significantly deconditioned. The DOE is worse than it used to be, but the change has been gradual. She rarely has LE edema. She denies orthopnea or PND. She has restless leg syndrome, so her legs move all the time overnight anyway.   Past Medical History:  Diagnosis Date  . Arthritis    Back, Hip- right  . Diabetes mellitus without complication ([place])    Type II  . Endometriosis   . Family history of adverse reaction to anesthesia    Father - N/V - blood pressure  . GERD (gastroesophageal reflux disease)   . History of kidney stones   . History of ulcerative colitis   . Hypertension   . Insomnia   . 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 Neuropathy ([place])   . Pneumonia    1983, 2003  . Umbilical hernia   . Vitamin D deficiency     Past Surgical History:  Procedure Laterality Date  . BREAST BIOPSY  08-13-2009  DR TSUEI   EXCISION LEFT NIPPLE DUCT  .  CHOLECYSTECTOMY  1992  . COLON RESECTION  1986   ULCERATIVE COLITIS  . COLON SURGERY    . ESOPHAGEAL MANOMETRY N/A 06/11/2013   Procedure: ESOPHAGEAL MANOMETRY (EM);  Surgeon: Jeryl Columbia, MD;  Location: [facility];  Service: Endoscopy;  Laterality: N/A;  . ESOPHAGOGASTRODUODENOSCOPY (EGD) WITH PROPOFOL N/A 05/31/2016   Procedure: ESOPHAGOGASTRODUODENOSCOPY (EGD) WITH PROPOFOL;  Surgeon: Clarene Essex, MD;  [facility] ENDOSCOPY;  Service: Endoscopy;  Laterality: N/A;  . EXCISION RIGHT BREAST MASS   10-20-2005  DR Marylene Buerger  . kidney stone removed  2/11  . KNEE ARTHROSCOPY Left    MCL  . LEFT THUMB CARPOMETACARPAL JOINT SUSPENSIONPLASTY  04-06-2006  DR Burney Gauze  . LEFT WRIST ARTHROSCOPY W/ DEBRIDEMENT AND REMOVAL CYST  03-26-2004  DR Burney Gauze  . MYOMECTOMY    . RIGHT COLECTOMY    . RIGHT URETEROSCOPIC STONE EXTRACTION  12-11-2009  DR Irine Seal  . TENDON RELEASE    . WRIST SURGERY     both    Current Outpatient Prescriptions  Medication Sig Dispense Refill  . amLODipine (NORVASC) 5 MG tablet Take 1 tablet (5 mg total) by mouth daily. 30 tablet 11  . aspirin EC 81 MG tablet  Take 81 mg by mouth daily.    Marland Kitchen b complex vitamins tablet Take 1 tablet by mouth daily.    . balsalazide (COLAZAL) 750 MG capsule Take 2-3 capsules by mouth See admin instructions. 3 capsules in the morning, and 2-3 capsules at bedtime    . Cholecalciferol (VITAMIN D) 2000 units tablet Take 4,000 Units by mouth daily.    . cholestyramine (QUESTRAN) 4 GM/DOSE powder Take 1 g by mouth daily. Reported on 11/05/2015    . fluticasone (FLONASE) 50 MCG/ACT nasal spray USE 1-2 SPRAYS IN EACH NOSTRIL DAILY AS NEEDED FOR ALLERGIES  12  . furosemide (LASIX) 20 MG tablet Take 20 mg by mouth daily.     Marland Kitchen gabapentin (NEURONTIN) 300 MG capsule Take 300 mg by mouth at bedtime.  1  . glimepiride (AMARYL) 4 MG tablet Take 4 mg by mouth daily with breakfast.    . HYDROcodone-acetaminophen (NORCO/VICODIN) 5-325 MG tablet Take 1 tablet  by mouth every 6 (six) hours as needed (pain). Reported on 11/05/2015  0  . magnesium gluconate (MAGONATE) 500 MG tablet Take 500 mg by mouth at bedtime.    . metFORMIN (GLUCOPHAGE-XR) 500 MG 24 hr tablet Take 1,000 mg by mouth 2 (two) times daily.     . Misc Natural Products (JOINT HEALTH) CAPS Take 1 capsule by mouth daily. ARTHROCEN 300 mg    . pantoprazole (PROTONIX) 40 MG tablet Take 40 mg by mouth 2 (two) times daily.     . quinapril (ACCUPRIL) 40 MG tablet Take 40 mg by mouth daily.    . rosuvastatin (CRESTOR) 20 MG tablet Take 1 tablet (20 mg total) by mouth daily. 90 tablet 0  . traMADol (ULTRAM) 50 MG tablet Take 2 tablets by mouth 2 (two) times daily as needed for moderate pain (pain).   2   No current facility-administered medications for this visit.     Allergies:   Ciprofloxacin hcl; Percocet [oxycodone-acetaminophen]; Penicillins; Sulfa antibiotics; Tape; and Tetanus toxoids    Social History:  The patient  reports that she quit smoking about 34 years ago. Her smoking use included Cigarettes. She has a 10.00 pack-year smoking history. She has never used smokeless tobacco. She reports that she does not drink alcohol or use drugs.   Family History:  The patient's family history includes Cancer in her father and maternal grandfather; Diabetes in her father, maternal grandfather, and maternal grandmother; Heart disease in her father, maternal grandfather, and maternal grandmother; Hypertension in her father and mother; Parkinson's disease in her mother; Stroke in her paternal grandfather and paternal grandmother.    ROS:  Please see the history of present illness. All other systems are reviewed and negative.    PHYSICAL EXAM: VS:  BP (!) 144/88   Pulse 79   Ht 5\' 4"  (1.626 m)   Wt 220 lb 12.8 oz (100.2 kg)   BMI 37.90 kg/m  , BMI Body mass index is 37.9 kg/m. GEN: Well nourished, well developed, female in no acute distress  HEENT: normal for age  Neck: no JVD, no carotid  bruit, no masses Cardiac: RRR; Soft murmur, no rubs, or gallops Respiratory:  clear to auscultation bilaterally, normal work of breathing GI: soft, nontender, nondistended, + BS MS: no deformity or atrophy; no edema; distal pulses are 2+ in all 4 extremities   Skin: warm and dry, no rash Neuro:  Strength and sensation are intact Psych: euthymic mood, full affect   EKG:  EKG is ordered today. The ekg ordered  today demonstrates SR, HR 79 No acute ischemic changes   Recent Labs: No results found for requested labs within last 8760 hours.    Lipid Panel No results found for: CHOL, TRIG, HDL, CHOLHDL, VLDL, LDLCALC, LDLDIRECT   Wt Readings from Last 3 Encounters:  11/15/16 220 lb 12.8 oz (100.2 kg)  05/31/16 225 lb (102.1 kg)  12/08/15 227 lb 14.4 oz (103.4 kg)     Other studies Reviewed: Additional studies/ records that were reviewed today include: office notes and testing.  ASSESSMENT AND PLAN:  1.  preop evaluation: Her functional status is moderately poor. She has DOE that has progressed recently. This is hopefully from deconditioning, but there is no way to be sure. She was supposed to have a Myoview last year, she put this off because of her MS problems. We will go ahead and get this preop.   If the MV is normal, no further evaluation is needed. If it is abnormal, review with Dr Oval Linsey.   Current medicines are reviewed at length with the patient today.  The patient does not have concerns regarding medicines.  The following changes have been made:  no change  Labs/ tests ordered today include:   Orders Placed This Encounter  Procedures  . Myocardial Perfusion Imaging  . EKG 12-Lead     Disposition:   FU with Dr. Oval Linsey  Signed, Bobbyjo Marulanda, Suanne Marker, PA-C  11/15/2016 1:58 PM    [facility] Phone: [phone]; Fax: [phone]  This note was written with the assistance of speech recognition software. Please excuse any  transcriptional errors.

## 2016-11-15 NOTE — Patient Instructions (Signed)
Lauren [name]  11/15/2016   Your procedure is scheduled on: Friday 11/26/2016  Report to [facility] Main  Entrance take [facility]  elevators to 3rd floor to  [place] at  [place]  AM.  Call this number if you have problems the morning of surgery [phone]   Remember: ONLY 1 PERSON MAY GO WITH YOU TO SHORT STAY TO GET  READY MORNING OF [place].   Do not eat food or drink liquids :After Midnight.     Take these medicines the morning of surgery with A SIP OF WATER: Amlodipine, use Flonase nasal spray if needed, Pantoprazole (Protonix)  How to Manage Your Diabetes Before and After Surgery  Why is it important to control my blood sugar before and after surgery? . Improving blood sugar levels before and after surgery helps healing and can limit problems. . A way of improving blood sugar control is eating a healthy diet by: o  Eating less sugar and carbohydrates o  Increasing activity/exercise o  Talking with your doctor about reaching your blood sugar goals . High blood sugars (greater than 180 mg/dL) can raise your risk of infections and slow your recovery, so you will need to focus on controlling your diabetes during the weeks before surgery. . Make sure that the doctor who takes care of your diabetes knows about your planned surgery including the date and location.  How do I manage my blood sugar before surgery? . Check your blood sugar at least 4 times a day, starting 2 days before surgery, to make sure that the level is not too high or low. o Check your blood sugar the morning of your surgery when you wake up and every 2 hours until you get to the Short Stay unit. . If your blood sugar is less than 70 mg/dL, you will need to treat for low blood sugar: o Do not take insulin. o Treat a low blood sugar (less than 70 mg/dL) with  cup of clear juice (cranberry or apple), 4 glucose tablets, OR glucose gel. o Recheck blood sugar in 15 minutes after  treatment (to make sure it is greater than 70 mg/dL). If your blood sugar is not greater than 70 mg/dL on recheck, call [phone] for further instructions. . Report your blood sugar to the short stay nurse when you get to Short Stay.  . If you are admitted to the hospital after surgery: o Your blood sugar will be checked by the staff and you will probably be given insulin after surgery (instead of oral diabetes medicines) to make sure you have good blood sugar levels. o The goal for blood sugar control after surgery is 80-180 mg/dL.   WHAT DO I DO ABOUT MY DIABETES MEDICATION?  Marland Kitchen Do not take oral diabetes medicines (pills) the morning of surgery.    DO NOT TAKE ANY DIABETIC MEDICATIONS DAY OF YOUR SURGERY!                                You may not have any metal on your body including hair pins and              piercings  Do not wear jewelry, make-up, lotions, powders or perfumes, deodorant             Do not wear nail polish.  Do  not shave  48 hours prior to surgery.              Men may shave face and neck.   Do not bring valuables to the hospital. [place].  Contacts, dentures or bridgework may not be worn into surgery.  Leave suitcase in the car. After surgery it may be brought to your room.                  Please read over the following fact sheets you were given: _____________________________________________________________________             [facility] - Preparing for Surgery Before surgery, you can play an important role.  Because skin is not sterile, your skin needs to be as free of germs as possible.  You can reduce the number of germs on your skin by washing with CHG (chlorahexidine gluconate) soap before surgery.  CHG is an antiseptic cleaner which kills germs and bonds with the skin to continue killing germs even after washing. Please DO NOT use if you have an allergy to CHG or antibacterial soaps.  If your  skin becomes reddened/irritated stop using the CHG and inform your nurse when you arrive at Short Stay. Do not shave (including legs and underarms) for at least 48 hours prior to the first CHG shower.  You may shave your face/neck. Please follow these instructions carefully:  1.  Shower with CHG Soap the night before surgery and the  morning of Surgery.  2.  If you choose to wash your hair, wash your hair first as usual with your  normal  shampoo.  3.  After you shampoo, rinse your hair and body thoroughly to remove the  shampoo.                           4.  Use CHG as you would any other liquid soap.  You can apply chg directly  to the skin and wash                       Gently with a scrungie or clean washcloth.  5.  Apply the CHG Soap to your body ONLY FROM THE NECK DOWN.   Do not use on face/ open                           Wound or open sores. Avoid contact with eyes, ears mouth and genitals (private parts).                       Wash face,  Genitals (private parts) with your normal soap.             6.  Wash thoroughly, paying special attention to the area where your surgery  will be performed.  7.  Thoroughly rinse your body with warm water from the neck down.  8.  DO NOT shower/wash with your normal soap after using and rinsing off  the CHG Soap.                9.  Pat yourself dry with a clean towel.            10.  Wear clean pajamas.  11.  Place clean sheets on your bed the night of your first shower and do not  sleep with pets. Day of Surgery : Do not apply any lotions/deodorants the morning of surgery.  Please wear clean clothes to the hospital/surgery center.  FAILURE TO FOLLOW THESE INSTRUCTIONS MAY RESULT IN THE CANCELLATION OF YOUR SURGERY PATIENT SIGNATURE_________________________________  NURSE SIGNATURE__________________________________  ________________________________________________________________________   [name]  An incentive spirometer  is a tool that can help keep your lungs clear and active. This tool measures how well you are filling your lungs with each breath. Taking long deep breaths may help reverse or decrease the chance of developing breathing (pulmonary) problems (especially infection) following:  A long period of time when you are unable to move or be active. BEFORE THE PROCEDURE   If the spirometer includes an indicator to show your best effort, your nurse or respiratory therapist will set it to a desired goal.  If possible, sit up straight or lean slightly forward. Try not to slouch.  Hold the incentive spirometer in an upright position. INSTRUCTIONS FOR USE  1. Sit on the edge of your bed if possible, or sit up as far as you can in bed or on a chair. 2. Hold the incentive spirometer in an upright position. 3. Breathe out normally. 4. Place the mouthpiece in your mouth and seal your lips tightly around it. 5. Breathe in slowly and as deeply as possible, raising the piston or the ball toward the top of the column. 6. Hold your breath for 3-5 seconds or for as long as possible. Allow the piston or ball to fall to the bottom of the column. 7. Remove the mouthpiece from your mouth and breathe out normally. 8. Rest for a few seconds and repeat Steps 1 through 7 at least 10 times every 1-2 hours when you are awake. Take your time and take a few normal breaths between deep breaths. 9. The spirometer may include an indicator to show your best effort. Use the indicator as a goal to work toward during each repetition. 10. After each set of 10 deep breaths, practice coughing to be sure your lungs are clear. If you have an incision (the cut made at the time of surgery), support your incision when coughing by placing a pillow or rolled up towels firmly against it. Once you are able to get out of bed, walk around indoors and cough well. You may stop using the incentive spirometer when instructed by your caregiver.  RISKS AND  COMPLICATIONS  Take your time so you do not get dizzy or light-headed.  If you are in pain, you may need to take or ask for pain medication before doing incentive spirometry. It is harder to take a deep breath if you are having pain. AFTER USE  Rest and breathe slowly and easily.  It can be helpful to keep track of a log of your progress. Your caregiver can provide you with a simple table to help with this. If you are using the spirometer at home, follow these instructions: [place] IF:   You are having difficultly using the spirometer.  You have trouble using the spirometer as often as instructed.  Your pain medication is not giving enough relief while using the spirometer.  You develop fever of 100.5 F (38.1 C) or higher. SEEK IMMEDIATE MEDICAL CARE IF:   You cough up bloody sputum that had not been present before.  You develop fever of 102 F (38.9 C)  or greater.  You develop worsening pain at or near the incision site. MAKE SURE YOU:   Understand these instructions.  Will watch your condition.  Will get help right away if you are not doing well or get worse. Document Released: 02/28/2007 Document Revised: 01/10/2012 Document Reviewed: 05/01/2007 ExitCare Patient Information 2014 ExitCare, [place].   ________________________________________________________________________  WHAT IS A BLOOD TRANSFUSION? Blood Transfusion Information  A transfusion is the replacement of blood or some of its parts. Blood is made up of multiple cells which provide different functions.  Red blood cells carry oxygen and are used for blood loss replacement.  White blood cells fight against infection.  Platelets control bleeding.  Plasma helps clot blood.  Other blood products are available for specialized needs, such as hemophilia or other clotting disorders. BEFORE THE TRANSFUSION  Who gives blood for transfusions?   Healthy volunteers who are fully evaluated to make sure  their blood is safe. This is blood bank blood. Transfusion therapy is the safest it has ever been in the practice of medicine. Before blood is taken from a donor, a complete history is taken to make sure that person has no history of diseases nor engages in risky social behavior (examples are intravenous drug use or sexual activity with multiple partners). The donor's travel history is screened to minimize risk of transmitting infections, such as malaria. The donated blood is tested for signs of infectious diseases, such as HIV and hepatitis. The blood is then tested to be sure it is compatible with you in order to minimize the chance of a transfusion reaction. If you or a relative donates blood, this is often done in anticipation of surgery and is not appropriate for emergency situations. It takes many days to process the donated blood. RISKS AND COMPLICATIONS Although transfusion therapy is very safe and saves many lives, the main dangers of transfusion include:   Getting an infectious disease.  Developing a transfusion reaction. This is an allergic reaction to something in the blood you were given. Every precaution is taken to prevent this. The decision to have a blood transfusion has been considered carefully by your caregiver before blood is given. Blood is not given unless the benefits outweigh the risks. AFTER THE TRANSFUSION  Right after receiving a blood transfusion, you will usually feel much better and more energetic. This is especially true if your red blood cells have gotten low (anemic). The transfusion raises the level of the red blood cells which carry oxygen, and this usually causes an energy increase.  The nurse administering the transfusion will monitor you carefully for complications. HOME CARE INSTRUCTIONS  No special instructions are needed after a transfusion. You may find your energy is better. Speak with your caregiver about any limitations on activity for underlying diseases  you may have. SEEK MEDICAL CARE IF:   Your condition is not improving after your transfusion.  You develop redness or irritation at the intravenous (IV) site. SEEK IMMEDIATE MEDICAL CARE IF:  Any of the following symptoms occur over the next 12 hours:  Shaking chills.  You have a temperature by mouth above 102 F (38.9 C), not controlled by medicine.  Chest, back, or muscle pain.  People around you feel you are not acting correctly or are confused.  Shortness of breath or difficulty breathing.  Dizziness and fainting.  You get a rash or develop hives.  You have a decrease in urine output.  Your urine turns a dark color or changes to pink, red,  or brown. Any of the following symptoms occur over the next 10 days:  You have a temperature by mouth above 102 F (38.9 C), not controlled by medicine.  Shortness of breath.  Weakness after normal activity.  The white part of the eye turns yellow (jaundice).  You have a decrease in the amount of urine or are urinating less often.  Your urine turns a dark color or changes to pink, red, or brown. Document Released: 10/15/2000 Document Revised: 01/10/2012 Document Reviewed: 06/03/2008 [facility] Patient Information 2014 [place], [place].  _______________________________________________________________________

## 2016-11-15 NOTE — Patient Instructions (Signed)
Your physician has requested that you have a lexiscan myoview. For further information please visit [email or web address]. Please follow instruction sheet, as given.  Your physician recommends that you schedule a follow-up appointment end of February or early March with Dr. Oval Linsey

## 2016-11-16 ENCOUNTER — Encounter (HOSPITAL_COMMUNITY): Payer: Self-pay

## 2016-11-16 ENCOUNTER — Encounter (HOSPITAL_COMMUNITY)
Admission: RE | Admit: 2016-11-16 | Discharge: 2016-11-16 | Disposition: A | Discharge status: Home / Self Care | Payer: PPO | Source: Ambulatory Visit | Attending: Orthopaedic Surgery | Admitting: Orthopaedic Surgery

## 2016-11-16 DIAGNOSIS — Z01818 Encounter for other preprocedural examination: Secondary | ICD-10-CM | POA: Insufficient documentation

## 2016-11-16 DIAGNOSIS — R0609 Other forms of dyspnea: Secondary | ICD-10-CM | POA: Diagnosis not present

## 2016-11-16 DIAGNOSIS — Z8249 Family history of ischemic heart disease and other diseases of the circulatory system: Secondary | ICD-10-CM | POA: Diagnosis not present

## 2016-11-16 DIAGNOSIS — R5383 Other fatigue: Secondary | ICD-10-CM | POA: Diagnosis not present

## 2016-11-16 DIAGNOSIS — E119 Type 2 diabetes mellitus without complications: Secondary | ICD-10-CM | POA: Diagnosis not present

## 2016-11-16 DIAGNOSIS — M1611 Unilateral primary osteoarthritis, right hip: Secondary | ICD-10-CM

## 2016-11-16 DIAGNOSIS — I10 Essential (primary) hypertension: Secondary | ICD-10-CM | POA: Diagnosis not present

## 2016-11-16 DIAGNOSIS — R002 Palpitations: Secondary | ICD-10-CM | POA: Diagnosis not present

## 2016-11-16 DIAGNOSIS — Z87891 Personal history of nicotine dependence: Secondary | ICD-10-CM | POA: Diagnosis not present

## 2016-11-16 LAB — SURGICAL PCR SCREEN
MRSA, PCR: NEGATIVE
Staphylococcus aureus: NEGATIVE

## 2016-11-16 LAB — BASIC METABOLIC PANEL
Anion gap: 10 (ref 5–15)
BUN: 15 mg/dL (ref 6–20)
CALCIUM: 9.5 mg/dL (ref 8.9–10.3)
CO2: 26 mmol/L (ref 22–32)
CREATININE: 0.73 mg/dL (ref 0.44–1.00)
Chloride: 103 mmol/L (ref 101–111)
GFR calc Af Amer: >60 mL/min (ref >60)
GFR calc non Af Amer: >60 mL/min (ref >60)
Glucose, Bld: 135 mg/dL — ABNORMAL HIGH (ref 65–99)
Potassium: 4.5 mmol/L (ref 3.5–5.1)
Sodium: 139 mmol/L (ref 135–145)

## 2016-11-16 LAB — CBC
HCT: 39.7 % (ref 36.0–46.0)
Hemoglobin: 13 g/dL (ref 12.0–15.0)
MCH: 28.1 pg (ref 26.0–34.0)
MCHC: 32.7 g/dL (ref 30.0–36.0)
MCV: 85.7 fL (ref 78.0–100.0)
Platelets: 309 10*3/uL (ref 150–400)
RBC: 4.63 MIL/uL (ref 3.87–5.11)
RDW: 14.5 % (ref 11.5–15.5)
WBC: 10.5 10*3/uL (ref 4.0–10.5)

## 2016-11-16 LAB — ABO/RH: ABO/RH(D): A POS

## 2016-11-16 LAB — GLUCOSE, CAPILLARY: GLUCOSE-CAPILLARY: 139 mg/dL — AB (ref 65–99)

## 2016-11-16 NOTE — Progress Notes (Signed)
   11/16/16 0817  OBSTRUCTIVE SLEEP APNEA  Have you ever been diagnosed with sleep apnea through a sleep study? No  Do you snore loudly (loud enough to be heard through closed doors)?  0  Do you often feel tired, fatigued, or sleepy during the daytime (such as falling asleep during driving or talking to someone)? 1  Has anyone observed you stop breathing during your sleep? 0  Do you have, or are you being treated for high blood pressure? 1  BMI more than 35 kg/m2? 1  Age > 50 (1-yes) 1  Neck circumference greater than:Female 16 inches or larger, Female 17inches or larger? 1  Female Gender (Yes=1) 0  Obstructive Sleep Apnea Score 5  Score 5 or greater  Results sent to PCP

## 2016-11-17 LAB — HEMOGLOBIN A1C
Hgb A1c MFr Bld: 6.7 % — ABNORMAL HIGH (ref 4.8–5.6)
Mean Plasma Glucose: 146 mg/dL

## 2016-11-19 ENCOUNTER — Ambulatory Visit (HOSPITAL_COMMUNITY)
Admission: RE | Admit: 2016-11-19 | Discharge: 2016-11-19 | Disposition: A | Discharge status: Home / Self Care | Payer: PPO | Source: Ambulatory Visit | Attending: Cardiology | Admitting: Cardiology

## 2016-11-19 DIAGNOSIS — Z8249 Family history of ischemic heart disease and other diseases of the circulatory system: Secondary | ICD-10-CM | POA: Insufficient documentation

## 2016-11-19 DIAGNOSIS — I10 Essential (primary) hypertension: Secondary | ICD-10-CM | POA: Insufficient documentation

## 2016-11-19 DIAGNOSIS — R5383 Other fatigue: Secondary | ICD-10-CM | POA: Insufficient documentation

## 2016-11-19 DIAGNOSIS — R002 Palpitations: Secondary | ICD-10-CM | POA: Insufficient documentation

## 2016-11-19 DIAGNOSIS — Z01818 Encounter for other preprocedural examination: Secondary | ICD-10-CM | POA: Insufficient documentation

## 2016-11-19 DIAGNOSIS — Z87891 Personal history of nicotine dependence: Secondary | ICD-10-CM | POA: Insufficient documentation

## 2016-11-19 DIAGNOSIS — E119 Type 2 diabetes mellitus without complications: Secondary | ICD-10-CM | POA: Insufficient documentation

## 2016-11-19 DIAGNOSIS — R0609 Other forms of dyspnea: Secondary | ICD-10-CM | POA: Diagnosis not present

## 2016-11-19 DIAGNOSIS — R0600 Dyspnea, unspecified: Secondary | ICD-10-CM

## 2016-11-19 LAB — MYOCARDIAL PERFUSION IMAGING
CHL CUP NUCLEAR SDS: 2
CHL CUP NUCLEAR SRS: 1
LV dias vol: 75 mL (ref 46–106)
LV sys vol: 21 mL
Peak HR: 101 {beats}/min
Rest HR: 82 {beats}/min
SSS: 31
TID: 1.15

## 2016-11-19 MED ORDER — REGADENOSON 0.4 MG/5ML IV SOLN
0.4000 mg | Freq: Once | INTRAVENOUS | Status: AC
  Administered 2016-11-19: 0.4 mg via INTRAVENOUS

## 2016-11-19 MED ORDER — TECHNETIUM TC 99M TETROFOSMIN IV KIT
29.6000 | PACK | Freq: Once | INTRAVENOUS | Status: AC | PRN
  Administered 2016-11-19: 29.6 via INTRAVENOUS
  Filled 2016-11-19: qty 30

## 2016-11-19 MED ORDER — TECHNETIUM TC 99M TETROFOSMIN IV KIT
10.3000 | PACK | Freq: Once | INTRAVENOUS | Status: AC | PRN
  Administered 2016-11-19: 10.3 via INTRAVENOUS
  Filled 2016-11-19: qty 11

## 2016-11-22 DIAGNOSIS — M47816 Spondylosis without myelopathy or radiculopathy, lumbar region: Secondary | ICD-10-CM | POA: Diagnosis not present

## 2016-11-22 DIAGNOSIS — M25551 Pain in right hip: Secondary | ICD-10-CM | POA: Diagnosis not present

## 2016-11-22 DIAGNOSIS — G894 Chronic pain syndrome: Secondary | ICD-10-CM | POA: Diagnosis not present

## 2016-11-22 DIAGNOSIS — Z79899 Other long term (current) drug therapy: Secondary | ICD-10-CM | POA: Diagnosis not present

## 2016-11-26 ENCOUNTER — Encounter (HOSPITAL_COMMUNITY)
Admission: RE | Disposition: A | Discharge status: Home Health | Payer: Self-pay | Source: Ambulatory Visit | Attending: Orthopaedic Surgery

## 2016-11-26 ENCOUNTER — Inpatient Hospital Stay (HOSPITAL_COMMUNITY): Payer: PPO

## 2016-11-26 ENCOUNTER — Encounter (HOSPITAL_COMMUNITY): Payer: Self-pay | Admitting: *Deleted

## 2016-11-26 ENCOUNTER — Inpatient Hospital Stay (HOSPITAL_COMMUNITY): Payer: PPO | Admitting: Registered Nurse

## 2016-11-26 ENCOUNTER — Inpatient Hospital Stay (HOSPITAL_COMMUNITY)
Admission: RE | Admit: 2016-11-26 | Discharge: 2016-11-28 | DRG: 470 | Disposition: A | Discharge status: Home Health | Payer: PPO | Source: Ambulatory Visit | Attending: Orthopaedic Surgery | Admitting: Orthopaedic Surgery

## 2016-11-26 DIAGNOSIS — Z87891 Personal history of nicotine dependence: Secondary | ICD-10-CM | POA: Diagnosis not present

## 2016-11-26 DIAGNOSIS — Z833 Family history of diabetes mellitus: Secondary | ICD-10-CM

## 2016-11-26 DIAGNOSIS — I10 Essential (primary) hypertension: Secondary | ICD-10-CM | POA: Diagnosis present

## 2016-11-26 DIAGNOSIS — K219 Gastro-esophageal reflux disease without esophagitis: Secondary | ICD-10-CM | POA: Diagnosis not present

## 2016-11-26 DIAGNOSIS — Z8249 Family history of ischemic heart disease and other diseases of the circulatory system: Secondary | ICD-10-CM

## 2016-11-26 DIAGNOSIS — Z419 Encounter for procedure for purposes other than remedying health state, unspecified: Secondary | ICD-10-CM

## 2016-11-26 DIAGNOSIS — Z96641 Presence of right artificial hip joint: Secondary | ICD-10-CM | POA: Diagnosis not present

## 2016-11-26 DIAGNOSIS — Z823 Family history of stroke: Secondary | ICD-10-CM | POA: Diagnosis not present

## 2016-11-26 DIAGNOSIS — M1611 Unilateral primary osteoarthritis, right hip: Secondary | ICD-10-CM | POA: Diagnosis not present

## 2016-11-26 DIAGNOSIS — E119 Type 2 diabetes mellitus without complications: Secondary | ICD-10-CM | POA: Diagnosis present

## 2016-11-26 DIAGNOSIS — Z471 Aftercare following joint replacement surgery: Secondary | ICD-10-CM | POA: Diagnosis not present

## 2016-11-26 HISTORY — PX: TOTAL HIP ARTHROPLASTY: SHX124

## 2016-11-26 LAB — GLUCOSE, CAPILLARY
Glucose-Capillary: 144 mg/dL — ABNORMAL HIGH (ref 65–99)
Glucose-Capillary: 95 mg/dL (ref 65–99)
Glucose-Capillary: 286 mg/dL — ABNORMAL HIGH (ref 65–99)
Glucose-Capillary: 213 mg/dL — ABNORMAL HIGH (ref 65–99)

## 2016-11-26 LAB — TYPE AND SCREEN
ABO/RH(D): A POS
Antibody Screen: NEGATIVE

## 2016-11-26 SURGERY — ARTHROPLASTY, HIP, TOTAL, ANTERIOR APPROACH
Anesthesia: Spinal | Laterality: Right

## 2016-11-26 MED ORDER — INSULIN ASPART 100 UNIT/ML ~~LOC~~ SOLN
0.0000 [IU] | Freq: Every day | SUBCUTANEOUS | Status: DC
  Administered 2016-11-26: 2 [IU] via SUBCUTANEOUS
  Filled 2016-11-26: qty 1

## 2016-11-26 MED ORDER — BALSALAZIDE DISODIUM 750 MG PO CAPS: 1500.0000 mg | ORAL_CAPSULE | ORAL | Status: DC

## 2016-11-26 MED ORDER — AMLODIPINE BESYLATE 5 MG PO TABS
5.0000 mg | ORAL_TABLET | Freq: Every day | ORAL | Status: DC
  Administered 2016-11-27: 5 mg via ORAL
  Filled 2016-11-26: qty 1

## 2016-11-26 MED ORDER — PHENYLEPHRINE HCL 10 MG/ML IJ SOLN
INTRAMUSCULAR | Status: AC
  Filled 2016-11-26: qty 1

## 2016-11-26 MED ORDER — FUROSEMIDE 20 MG PO TABS
20.0000 mg | ORAL_TABLET | Freq: Every day | ORAL | Status: DC
  Administered 2016-11-26 – 2016-11-27 (×2): 20 mg via ORAL
  Filled 2016-11-26 (×2): qty 1

## 2016-11-26 MED ORDER — FENTANYL CITRATE (PF) 100 MCG/2ML IJ SOLN
INTRAMUSCULAR | Status: DC | PRN
  Administered 2016-11-26: 100 ug via INTRAVENOUS

## 2016-11-26 MED ORDER — DEXAMETHASONE SODIUM PHOSPHATE 10 MG/ML IJ SOLN
INTRAMUSCULAR | Status: AC
  Filled 2016-11-26: qty 1

## 2016-11-26 MED ORDER — INSULIN ASPART 100 UNIT/ML ~~LOC~~ SOLN
0.0000 [IU] | Freq: Three times a day (TID) | SUBCUTANEOUS | Status: DC
  Administered 2016-11-26: 8 [IU] via SUBCUTANEOUS
  Administered 2016-11-27: 2 [IU] via SUBCUTANEOUS
  Administered 2016-11-27: 3 [IU] via SUBCUTANEOUS

## 2016-11-26 MED ORDER — BALSALAZIDE DISODIUM 750 MG PO CAPS
2250.0000 mg | ORAL_CAPSULE | Freq: Every day | ORAL | Status: DC
  Administered 2016-11-27: 2250 mg via ORAL
  Filled 2016-11-26 (×2): qty 3

## 2016-11-26 MED ORDER — DOCUSATE SODIUM 100 MG PO CAPS
100.0000 mg | ORAL_CAPSULE | Freq: Two times a day (BID) | ORAL | Status: DC
  Administered 2016-11-26 – 2016-11-27 (×3): 100 mg via ORAL
  Filled 2016-11-26 (×4): qty 1

## 2016-11-26 MED ORDER — PROPOFOL 10 MG/ML IV BOLUS
INTRAVENOUS | Status: AC
Start: 2016-11-26 — End: 2016-11-26
  Filled 2016-11-26: qty 20

## 2016-11-26 MED ORDER — SODIUM CHLORIDE 0.9 % IR SOLN
Status: DC | PRN
  Administered 2016-11-26: 1000 mL

## 2016-11-26 MED ORDER — METFORMIN HCL ER 500 MG PO TB24
1000.0000 mg | ORAL_TABLET | Freq: Two times a day (BID) | ORAL | Status: DC
  Administered 2016-11-26 – 2016-11-28 (×4): 1000 mg via ORAL
  Filled 2016-11-26 (×4): qty 2

## 2016-11-26 MED ORDER — HYDROMORPHONE HCL 1 MG/ML IJ SOLN
0.2500 mg | INTRAMUSCULAR | Status: DC | PRN
Start: 2016-11-26 — End: 2016-11-26
  Administered 2016-11-26 (×4): 0.5 mg via INTRAVENOUS

## 2016-11-26 MED ORDER — BALSALAZIDE DISODIUM 750 MG PO CAPS
1500.0000 mg | ORAL_CAPSULE | Freq: Every day | ORAL | Status: DC
  Administered 2016-11-26: 2250 mg via ORAL
  Administered 2016-11-27: 1500 mg via ORAL
  Filled 2016-11-26 (×2): qty 3

## 2016-11-26 MED ORDER — VITAMIN D 1000 UNITS PO TABS
4000.0000 [IU] | ORAL_TABLET | Freq: Every day | ORAL | Status: DC
Start: 2016-11-26 — End: 2016-11-28
  Administered 2016-11-27: 4000 [IU] via ORAL
  Filled 2016-11-26: qty 4

## 2016-11-26 MED ORDER — CLINDAMYCIN PHOSPHATE 900 MG/50ML IV SOLN
900.0000 mg | INTRAVENOUS | Status: AC
  Administered 2016-11-26: 900 mg via INTRAVENOUS

## 2016-11-26 MED ORDER — HYDROMORPHONE HCL 1 MG/ML IJ SOLN
INTRAMUSCULAR | Status: AC
  Filled 2016-11-26: qty 1

## 2016-11-26 MED ORDER — POLYETHYLENE GLYCOL 3350 17 G PO PACK: 17.0000 g | PACK | Freq: Every day | ORAL | Status: DC | PRN

## 2016-11-26 MED ORDER — CLINDAMYCIN PHOSPHATE 900 MG/50ML IV SOLN
INTRAVENOUS | Status: AC
  Filled 2016-11-26: qty 50

## 2016-11-26 MED ORDER — ACETAMINOPHEN 325 MG PO TABS: 650.0000 mg | ORAL_TABLET | Freq: Four times a day (QID) | ORAL | Status: DC | PRN

## 2016-11-26 MED ORDER — PHENYLEPHRINE 40 MCG/ML (10ML) SYRINGE FOR IV PUSH (FOR BLOOD PRESSURE SUPPORT)
PREFILLED_SYRINGE | INTRAVENOUS | Status: AC
  Filled 2016-11-26: qty 10

## 2016-11-26 MED ORDER — ONDANSETRON HCL 4 MG PO TABS: 4.0000 mg | ORAL_TABLET | Freq: Four times a day (QID) | ORAL | Status: DC | PRN

## 2016-11-26 MED ORDER — HYDROMORPHONE HCL 1 MG/ML IJ SOLN
0.5000 mg | INTRAMUSCULAR | Status: DC | PRN
  Administered 2016-11-26 (×2): 0.5 mg via INTRAVENOUS
  Filled 2016-11-26 (×2): qty 0.5

## 2016-11-26 MED ORDER — ROSUVASTATIN CALCIUM 20 MG PO TABS
20.0000 mg | ORAL_TABLET | Freq: Every day | ORAL | Status: DC
  Administered 2016-11-26 – 2016-11-27 (×2): 20 mg via ORAL
  Filled 2016-11-26 (×2): qty 1

## 2016-11-26 MED ORDER — HYDROCODONE-ACETAMINOPHEN 10-325 MG PO TABS
1.0000 | ORAL_TABLET | ORAL | Status: DC | PRN
  Administered 2016-11-26 – 2016-11-28 (×9): 2 via ORAL
  Filled 2016-11-26 (×9): qty 2

## 2016-11-26 MED ORDER — ALUM & MAG HYDROXIDE-SIMETH 200-200-20 MG/5ML PO SUSP: 30.0000 mL | ORAL | Status: DC | PRN

## 2016-11-26 MED ORDER — MEPERIDINE HCL 50 MG/ML IJ SOLN
6.2500 mg | INTRAMUSCULAR | Status: DC | PRN
  Administered 2016-11-26: 12.5 mg via INTRAVENOUS

## 2016-11-26 MED ORDER — KETOROLAC TROMETHAMINE 15 MG/ML IJ SOLN
7.5000 mg | Freq: Four times a day (QID) | INTRAMUSCULAR | Status: AC
  Administered 2016-11-26 – 2016-11-27 (×4): 7.5 mg via INTRAVENOUS
  Filled 2016-11-26 (×4): qty 1

## 2016-11-26 MED ORDER — MEPERIDINE HCL 50 MG/ML IJ SOLN
INTRAMUSCULAR | Status: AC
  Filled 2016-11-26: qty 1

## 2016-11-26 MED ORDER — METOCLOPRAMIDE HCL 5 MG PO TABS: 5.0000 mg | ORAL_TABLET | Freq: Three times a day (TID) | ORAL | Status: DC | PRN

## 2016-11-26 MED ORDER — ONDANSETRON HCL 4 MG/2ML IJ SOLN: 4.0000 mg | Freq: Four times a day (QID) | INTRAMUSCULAR | Status: DC | PRN

## 2016-11-26 MED ORDER — BUPIVACAINE IN DEXTROSE 0.75-8.25 % IT SOLN
INTRATHECAL | Status: DC | PRN
  Administered 2016-11-26: 2 mL via INTRATHECAL

## 2016-11-26 MED ORDER — ASPIRIN 81 MG PO CHEW
81.0000 mg | CHEWABLE_TABLET | Freq: Two times a day (BID) | ORAL | Status: DC
  Administered 2016-11-26 – 2016-11-27 (×3): 81 mg via ORAL
  Filled 2016-11-26 (×3): qty 1

## 2016-11-26 MED ORDER — PANTOPRAZOLE SODIUM 40 MG PO TBEC
40.0000 mg | DELAYED_RELEASE_TABLET | Freq: Two times a day (BID) | ORAL | Status: DC
  Administered 2016-11-26 – 2016-11-27 (×3): 40 mg via ORAL
  Filled 2016-11-26 (×3): qty 1

## 2016-11-26 MED ORDER — PROPOFOL 500 MG/50ML IV EMUL
INTRAVENOUS | Status: DC | PRN
  Administered 2016-11-26: 100 ug/kg/min via INTRAVENOUS

## 2016-11-26 MED ORDER — METHOCARBAMOL 500 MG PO TABS
500.0000 mg | ORAL_TABLET | Freq: Four times a day (QID) | ORAL | Status: DC | PRN
  Administered 2016-11-27: 500 mg via ORAL
  Filled 2016-11-26: qty 1

## 2016-11-26 MED ORDER — MENTHOL 3 MG MT LOZG: 1.0000 | LOZENGE | OROMUCOSAL | Status: DC | PRN

## 2016-11-26 MED ORDER — PHENYLEPHRINE HCL 10 MG/ML IJ SOLN
INTRAMUSCULAR | Status: DC | PRN
  Administered 2016-11-26: 80 ug via INTRAVENOUS
  Administered 2016-11-26 (×2): 120 ug via INTRAVENOUS

## 2016-11-26 MED ORDER — PHENOL 1.4 % MT LIQD
1.0000 | OROMUCOSAL | Status: DC | PRN
  Filled 2016-11-26: qty 177

## 2016-11-26 MED ORDER — MIDAZOLAM HCL 2 MG/2ML IJ SOLN
INTRAMUSCULAR | Status: AC
  Filled 2016-11-26: qty 2

## 2016-11-26 MED ORDER — GLIMEPIRIDE 4 MG PO TABS
4.0000 mg | ORAL_TABLET | Freq: Every day | ORAL | Status: DC
  Administered 2016-11-27 – 2016-11-28 (×2): 4 mg via ORAL
  Filled 2016-11-26 (×2): qty 1

## 2016-11-26 MED ORDER — TRANEXAMIC ACID 1000 MG/10ML IV SOLN
1000.0000 mg | INTRAVENOUS | Status: AC
  Administered 2016-11-26: 1000 mg via INTRAVENOUS
  Filled 2016-11-26: qty 1100

## 2016-11-26 MED ORDER — ACETAMINOPHEN 10 MG/ML IV SOLN
INTRAVENOUS | Status: AC
  Filled 2016-11-26: qty 100

## 2016-11-26 MED ORDER — DEXAMETHASONE SODIUM PHOSPHATE 10 MG/ML IJ SOLN
INTRAMUSCULAR | Status: DC | PRN
  Administered 2016-11-26: 10 mg via INTRAVENOUS

## 2016-11-26 MED ORDER — MIDAZOLAM HCL 5 MG/5ML IJ SOLN
INTRAMUSCULAR | Status: DC | PRN
  Administered 2016-11-26: 2 mg via INTRAVENOUS

## 2016-11-26 MED ORDER — DIPHENHYDRAMINE HCL 12.5 MG/5ML PO ELIX: 12.5000 mg | ORAL_SOLUTION | ORAL | Status: DC | PRN

## 2016-11-26 MED ORDER — CLINDAMYCIN PHOSPHATE 600 MG/50ML IV SOLN
600.0000 mg | Freq: Four times a day (QID) | INTRAVENOUS | Status: AC
  Administered 2016-11-26 (×2): 600 mg via INTRAVENOUS
  Filled 2016-11-26 (×2): qty 50

## 2016-11-26 MED ORDER — PROMETHAZINE HCL 25 MG/ML IJ SOLN: 6.2500 mg | INTRAMUSCULAR | Status: DC | PRN

## 2016-11-26 MED ORDER — QUINAPRIL HCL 10 MG PO TABS
40.0000 mg | ORAL_TABLET | Freq: Every day | ORAL | Status: DC
  Filled 2016-11-26: qty 4

## 2016-11-26 MED ORDER — METHOCARBAMOL 1000 MG/10ML IJ SOLN
500.0000 mg | Freq: Four times a day (QID) | INTRAMUSCULAR | Status: DC | PRN
  Administered 2016-11-26 (×2): 500 mg via INTRAVENOUS
  Filled 2016-11-26: qty 5
  Filled 2016-11-26 (×2): qty 550

## 2016-11-26 MED ORDER — MAGNESIUM GLUCONATE 500 MG PO TABS
500.0000 mg | ORAL_TABLET | Freq: Every day | ORAL | Status: DC
  Administered 2016-11-26 – 2016-11-27 (×2): 500 mg via ORAL
  Filled 2016-11-26 (×2): qty 1

## 2016-11-26 MED ORDER — GABAPENTIN 300 MG PO CAPS
300.0000 mg | ORAL_CAPSULE | Freq: Every day | ORAL | Status: DC
  Administered 2016-11-26 – 2016-11-27 (×2): 300 mg via ORAL
  Filled 2016-11-26 (×2): qty 1

## 2016-11-26 MED ORDER — PROPOFOL 10 MG/ML IV BOLUS
INTRAVENOUS | Status: AC
  Filled 2016-11-26: qty 60

## 2016-11-26 MED ORDER — PHENYLEPHRINE HCL 10 MG/ML IJ SOLN
INTRAVENOUS | Status: DC | PRN
  Administered 2016-11-26: 35 ug/min via INTRAVENOUS

## 2016-11-26 MED ORDER — LACTATED RINGERS IV SOLN
INTRAVENOUS | Status: DC | PRN
  Administered 2016-11-26 (×3): via INTRAVENOUS

## 2016-11-26 MED ORDER — ZOLPIDEM TARTRATE 5 MG PO TABS: 5.0000 mg | ORAL_TABLET | Freq: Every evening | ORAL | Status: DC | PRN

## 2016-11-26 MED ORDER — FENTANYL CITRATE (PF) 100 MCG/2ML IJ SOLN
INTRAMUSCULAR | Status: AC
  Filled 2016-11-26: qty 2

## 2016-11-26 MED ORDER — SODIUM CHLORIDE 0.9 % IV SOLN
INTRAVENOUS | Status: DC
  Administered 2016-11-26 – 2016-11-27 (×2): via INTRAVENOUS

## 2016-11-26 MED ORDER — CHOLESTYRAMINE 4 G PO PACK
4.0000 g | PACK | Freq: Every day | ORAL | Status: DC
  Administered 2016-11-26 – 2016-11-27 (×2): 4 g via ORAL
  Filled 2016-11-26 (×3): qty 1

## 2016-11-26 MED ORDER — METOCLOPRAMIDE HCL 5 MG/ML IJ SOLN: 5.0000 mg | Freq: Three times a day (TID) | INTRAMUSCULAR | Status: DC | PRN

## 2016-11-26 MED ORDER — CHLORHEXIDINE GLUCONATE 4 % EX LIQD
60.0000 mL | Freq: Once | CUTANEOUS | Status: DC
Start: 2016-11-26 — End: 2016-11-26

## 2016-11-26 MED ORDER — ONDANSETRON HCL 4 MG/2ML IJ SOLN
INTRAMUSCULAR | Status: DC | PRN
  Administered 2016-11-26: 4 mg via INTRAVENOUS

## 2016-11-26 MED ORDER — ONDANSETRON HCL 4 MG/2ML IJ SOLN
INTRAMUSCULAR | Status: AC
  Filled 2016-11-26: qty 2

## 2016-11-26 MED ORDER — LISINOPRIL 20 MG PO TABS
40.0000 mg | ORAL_TABLET | Freq: Every day | ORAL | Status: DC
  Administered 2016-11-27: 40 mg via ORAL
  Filled 2016-11-26: qty 2

## 2016-11-26 MED ORDER — ACETAMINOPHEN 650 MG RE SUPP: 650.0000 mg | Freq: Four times a day (QID) | RECTAL | Status: DC | PRN

## 2016-11-26 SURGICAL SUPPLY — 35 items
BAG ZIPLOCK 12X15 (MISCELLANEOUS) IMPLANT
BENZOIN TINCTURE PRP APPL 2/3 (GAUZE/BANDAGES/DRESSINGS) IMPLANT
BLADE SAW SGTL 18X1.27X75 (BLADE) ×2 IMPLANT
CAPT HIP TOTAL 2 ×2 IMPLANT
CELLS DAT CNTRL 66122 CELL SVR (MISCELLANEOUS) ×1 IMPLANT
CLOTH BEACON ORANGE TIMEOUT ST (SAFETY) ×2 IMPLANT
COVER PERINEAL POST (MISCELLANEOUS) ×2 IMPLANT
DRAPE STERI IOBAN 125X83 (DRAPES) ×2 IMPLANT
DRAPE U-SHAPE 47X51 STRL (DRAPES) ×4 IMPLANT
DRSG AQUACEL AG ADV 3.5X10 (GAUZE/BANDAGES/DRESSINGS) ×2 IMPLANT
DURAPREP 26ML APPLICATOR (WOUND CARE) ×2 IMPLANT
ELECT REM PT RETURN 9FT ADLT (ELECTROSURGICAL) ×2
ELECTRODE REM PT RTRN 9FT ADLT (ELECTROSURGICAL) ×1 IMPLANT
GAUZE XEROFORM 1X8 LF (GAUZE/BANDAGES/DRESSINGS) ×2 IMPLANT
GLOVE BIO SURGEON STRL SZ7.5 (GLOVE) ×2 IMPLANT
GLOVE BIOGEL PI IND STRL 8 (GLOVE) ×2 IMPLANT
GLOVE BIOGEL PI INDICATOR 8 (GLOVE) ×2
GLOVE ECLIPSE 8.0 STRL XLNG CF (GLOVE) ×2 IMPLANT
GOWN STRL REUS W/TWL XL LVL3 (GOWN DISPOSABLE) ×4 IMPLANT
HANDPIECE INTERPULSE COAX TIP (DISPOSABLE) ×1
HOLDER FOLEY CATH W/STRAP (MISCELLANEOUS) ×2 IMPLANT
PACK ANTERIOR HIP CUSTOM (KITS) ×2 IMPLANT
RTRCTR WOUND ALEXIS 18CM MED (MISCELLANEOUS) ×2
SET HNDPC FAN SPRY TIP SCT (DISPOSABLE) ×1 IMPLANT
STAPLER VISISTAT 35W (STAPLE) IMPLANT
STRIP CLOSURE SKIN 1/2X4 (GAUZE/BANDAGES/DRESSINGS) IMPLANT
SUT ETHIBOND NAB CT1 #1 30IN (SUTURE) ×2 IMPLANT
SUT MNCRL AB 4-0 PS2 18 (SUTURE) IMPLANT
SUT VIC AB 0 CT1 36 (SUTURE) ×2 IMPLANT
SUT VIC AB 1 CT1 36 (SUTURE) ×2 IMPLANT
SUT VIC AB 2-0 CT1 27 (SUTURE) ×2
SUT VIC AB 2-0 CT1 TAPERPNT 27 (SUTURE) ×2 IMPLANT
TRAY FOLEY W/METER SILVER 14FR (SET/KITS/TRAYS/PACK) ×2 IMPLANT
TRAY FOLEY W/METER SILVER 16FR (SET/KITS/TRAYS/PACK) IMPLANT
YANKAUER SUCT BULB TIP 10FT TU (MISCELLANEOUS) ×2 IMPLANT

## 2016-11-26 NOTE — Anesthesia Preprocedure Evaluation (Signed)
Anesthesia Evaluation  Patient identified by MRN, date of birth, ID band Patient awake    Reviewed: Allergy & Precautions, NPO status , Patient's Chart, lab work & pertinent test results  Airway Mallampati: II  TM Distance: >3 FB Neck ROM: Full    Dental no notable dental hx.    Pulmonary neg pulmonary ROS, former smoker,    Pulmonary exam normal breath sounds clear to auscultation       Cardiovascular hypertension, Normal cardiovascular exam Rhythm:Regular Rate:Normal     Neuro/Psych negative neurological ROS  negative psych ROS   GI/Hepatic Neg liver ROS, GERD  ,  Endo/Other  diabetes  Renal/GU negative Renal ROS  negative genitourinary   Musculoskeletal negative musculoskeletal ROS (+)   Abdominal   Peds negative pediatric ROS (+)  Hematology negative hematology ROS (+)   Anesthesia Other Findings   Reproductive/Obstetrics negative OB ROS                             Anesthesia Physical Anesthesia Plan  ASA: II  Anesthesia Plan: Spinal   Post-op Pain Management:    Induction: Intravenous  Airway Management Planned:   Additional Equipment:   Intra-op Plan:   Post-operative Plan:   Informed Consent: I have reviewed the patients History and Physical, chart, labs and discussed the procedure including the risks, benefits and alternatives for the proposed anesthesia with the patient or authorized representative who has indicated his/her understanding and acceptance.   Dental advisory given  Plan Discussed with: CRNA and Surgeon  Anesthesia Plan Comments:         Anesthesia Quick Evaluation

## 2016-11-26 NOTE — Evaluation (Signed)
Physical Therapy Evaluation Patient Details Name: Lauren [name] MRN: [id number] DOB: Oct 31, 1946 Today's Date: 11/26/2016   History of Present Illness  Pt s/p R THR  Clinical Impression  Pt s/p R THR and presents with decreased R LE strength/ROM and post op pain limiting functional mobility.  Pt should progress to dc home with family assist and HHPT follow up.    Follow Up Recommendations Home health PT    Equipment Recommendations  Rolling walker with 5" wheels    Recommendations for Other Services OT consult     Precautions / Restrictions Precautions Precautions: Fall Restrictions Weight Bearing Restrictions: No Other Position/Activity Restrictions: WBAT      Mobility  Bed Mobility Overal bed mobility: Needs Assistance Bed Mobility: Supine to Sit;Sit to Supine     Supine to sit: Min assist;Mod assist Sit to supine: Min assist;Mod assist   General bed mobility comments: cues for sequence and use of L LE to self assist  Transfers Overall transfer level: Needs assistance Equipment used: Rolling walker (2 wheeled) Transfers: Sit to/from Stand Sit to Stand: Min assist;Mod assist         General transfer comment: cues for LE management and use of UEs to self assist  Ambulation/Gait Ambulation/Gait assistance: Min assist Ambulation Distance (Feet): 40 Feet Assistive device: Rolling walker (2 wheeled) Gait Pattern/deviations: Step-to pattern;Decreased step length - right;Decreased step length - left;Shuffle;Trunk flexed Gait velocity: decr Gait velocity interpretation: Below normal speed for age/gender General Gait Details: cues for posture, position from RW and sequence  Stairs            Wheelchair Mobility    Modified Rankin (Stroke Patients Only)       Balance Overall balance assessment: No apparent balance deficits (not formally assessed)                                           Pertinent Vitals/Pain Pain Assessment:  0-10 Pain Score: 5  Pain Location: R hip Pain Descriptors / Indicators: Aching;Sore Pain Intervention(s): Limited activity within patient's tolerance;Premedicated before session;Monitored during session;Ice applied    Home Living Family/patient expects to be discharged to:: Private residence Living Arrangements: Spouse/significant other Available Help at Discharge: Family Type of Home: House Home Access: Stairs to enter Entrance Stairs-Rails: Chemical engineer of Steps: 4 Home Layout: One level Home Equipment: Cane - single point      Prior Function Level of Independence: Independent;Independent with assistive device(s)               Hand Dominance        Extremity/Trunk Assessment   Upper Extremity Assessment Upper Extremity Assessment: Overall WFL for tasks assessed    Lower Extremity Assessment Lower Extremity Assessment: RLE deficits/detail    Cervical / Trunk Assessment Cervical / Trunk Assessment: Normal  Communication   Communication: No difficulties  Cognition Arousal/Alertness: Awake/alert Behavior During Therapy: WFL for tasks assessed/performed Overall Cognitive Status: Within Functional Limits for tasks assessed                      General Comments      Exercises Total Joint Exercises Ankle Circles/Pumps: AROM;Both;15 reps;Supine   Assessment/Plan    PT Assessment Patient needs continued PT services  PT Problem List Decreased strength;Decreased range of motion;Decreased activity tolerance;Decreased mobility;Decreased knowledge of use of DME;Pain;Obesity  PT Treatment Interventions DME instruction;Gait training;Stair training;Functional mobility training;Therapeutic activities;Therapeutic exercise;Patient/family education    PT Goals (Current goals can be found in the Care Plan section)  Acute Rehab PT Goals Patient Stated Goal: Regain IND PT Goal Formulation: With patient Time For Goal Achievement:  11/30/16 Potential to Achieve Goals: Good    Frequency 7X/week   Barriers to discharge        Co-evaluation               End of Session Equipment Utilized During Treatment: Gait belt Activity Tolerance: Patient tolerated treatment well Patient left: in bed;with call bell/phone within reach;with family/visitor present Nurse Communication: Mobility status         Time: 1540-1410 PT Time Calculation (min) (ACUTE ONLY): 1350 min   Charges:   PT Evaluation $PT Eval Low Complexity: 1 Procedure PT Treatments $Gait Training: 8-22 mins   PT G Codes:        Aldonia Keeven 2016-11-27, 5:41 PM

## 2016-11-26 NOTE — Progress Notes (Addendum)
Spoke with pt concerning discharge needs. Pt asked for RW which was delivered to her room form [facility]. Pt will discharge home with [facility] at [facility].

## 2016-11-26 NOTE — H&P (Signed)
TOTAL HIP ADMISSION H&P  Patient is admitted for right total hip arthroplasty.  Subjective:  Chief Complaint: right hip pain  HPI: Lauren [name], 71 y.o. female, has a history of pain and functional disability in the right hip(s) due to arthritis and patient has failed non-surgical conservative treatments for greater than 12 weeks to include NSAID's and/or analgesics, use of assistive devices, weight reduction as appropriate and activity modification.  Onset of symptoms was gradual starting 2 years ago with gradually worsening course since that time.The patient noted no past surgery on the right hip(s).  Patient currently rates pain in the right hip at 10 out of 10 with activity. Patient has night pain, worsening of pain with activity and weight bearing, trendelenberg gait, pain that interfers with activities of daily living, pain with passive range of motion and crepitus. Patient has evidence of subchondral cysts, subchondral sclerosis, periarticular osteophytes and joint space narrowing by imaging studies. This condition presents safety issues increasing the risk of falls.  There is no current active infection.  Patient Active Problem List   Diagnosis Date Noted  . Unilateral primary osteoarthritis, right hip 11/26/2016  . Hiatal hernia 04/13/2013  . Type II or unspecified type diabetes mellitus without mention of complication, uncontrolled 01/26/2013  . Umbilical hernia [id number]  . Hypertension    Past Medical History:  Diagnosis Date  . Arthritis    Back, Hip- right  . Diabetes mellitus without complication ([place])    Type II  . Endometriosis   . Family history of adverse reaction to anesthesia    Father - N/V - blood pressure  . GERD (gastroesophageal reflux disease)   . History of kidney stones   . History of ulcerative colitis   . Hypertension   . Insomnia   . 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 Neuropathy ([place])   . Pneumonia    1983, 2003  . Umbilical hernia   . Vitamin  D deficiency     Past Surgical History:  Procedure Laterality Date  . BREAST BIOPSY  08-13-2009  DR TSUEI   EXCISION LEFT NIPPLE DUCT  . CHOLECYSTECTOMY  1992  . COLON RESECTION  1986   ULCERATIVE COLITIS  . COLON SURGERY    . ESOPHAGEAL MANOMETRY N/A 06/11/2013   Procedure: ESOPHAGEAL MANOMETRY (EM);  Surgeon: Jeryl Columbia, MD;  Location: [facility];  Service: Endoscopy;  Laterality: N/A;  . ESOPHAGOGASTRODUODENOSCOPY (EGD) WITH PROPOFOL N/A 05/31/2016   Procedure: ESOPHAGOGASTRODUODENOSCOPY (EGD) WITH PROPOFOL;  Surgeon: Clarene Essex, MD;  Location: [facility];  Service: Endoscopy;  Laterality: N/A;  . EXCISION RIGHT BREAST MASS   10-20-2005  DR Marylene Buerger  . kidney stone removed  2/11  . KNEE ARTHROSCOPY Left    MCL  . LEFT THUMB CARPOMETACARPAL JOINT SUSPENSIONPLASTY  04-06-2006  DR Burney Gauze  . LEFT WRIST ARTHROSCOPY W/ DEBRIDEMENT AND REMOVAL CYST  03-26-2004  DR Burney Gauze  . MYOMECTOMY    . RIGHT COLECTOMY    . RIGHT URETEROSCOPIC STONE EXTRACTION  12-11-2009  DR Irine Seal  . TENDON RELEASE    . WRIST SURGERY     both    Prescriptions Prior to Admission  Medication Sig Dispense Refill Last Dose  . amLODipine (NORVASC) 5 MG tablet Take 1 tablet (5 mg total) by mouth daily. 30 tablet 11 11/26/2016 at 0430  . aspirin EC 81 MG tablet Take 81 mg by mouth daily.   11/19/2016  . b complex vitamins tablet Take 1 tablet by  mouth daily.   Taking  . balsalazide (COLAZAL) 750 MG capsule Take 2-3 capsules by mouth See admin instructions. 3 capsules in the morning, and 2-3 capsules at bedtime   11/25/2016 at pm  . Cholecalciferol (VITAMIN D) 2000 units tablet Take 4,000 Units by mouth daily.   11/25/2016 at Unknown time  . cholestyramine (QUESTRAN) 4 GM/DOSE powder Take 1 g by mouth daily. Reported on 11/05/2015   11/25/2016 at Unknown time  . fluticasone (FLONASE) 50 MCG/ACT nasal spray USE 1-2 SPRAYS IN EACH NOSTRIL DAILY AS NEEDED FOR ALLERGIES  12 11/26/2016 at 0500  . furosemide (LASIX) 20  MG tablet Take 20 mg by mouth daily.    11/25/2016 at Unknown time  . gabapentin (NEURONTIN) 300 MG capsule Take 300 mg by mouth at bedtime.  1 11/25/2016 at Unknown time  . glimepiride (AMARYL) 4 MG tablet Take 4 mg by mouth daily with breakfast.   11/25/2016 at am  . lidocaine (ASPERCREME W/LIDOCAINE) 4 % cream Apply 1 application topically daily as needed. Applies to hip-right at bedtime   Past Week at Unknown time  . magnesium gluconate (MAGONATE) 500 MG tablet Take 500 mg by mouth at bedtime.   11/25/2016 at pm  . metFORMIN (GLUCOPHAGE-XR) 500 MG 24 hr tablet Take 1,000 mg by mouth 2 (two) times daily.    11/25/2016 at Unknown time  . Misc Natural Products (JOINT HEALTH) CAPS Take 1 capsule by mouth daily. ARTHROCEN 300 mg   11/25/2016 at Unknown time  . pantoprazole (PROTONIX) 40 MG tablet Take 40 mg by mouth 2 (two) times daily.    11/26/2016 at 0430  . quinapril (ACCUPRIL) 40 MG tablet Take 40 mg by mouth daily.   11/25/2016 at Unknown time  . rosuvastatin (CRESTOR) 20 MG tablet Take 1 tablet (20 mg total) by mouth daily. 90 tablet 0 11/25/2016 at Unknown time  . traMADol (ULTRAM) 50 MG tablet Take 2 tablets by mouth 2 (two) times daily as needed for moderate pain (pain).   2 11/25/2016 at Unknown time  . HYDROcodone-acetaminophen (NORCO/VICODIN) 5-325 MG tablet Take 1 tablet by mouth every 6 (six) hours as needed (pain). Reported on 11/05/2015  0 More than a month at Unknown time   Allergies  Allergen Reactions  . Ciprofloxacin Hcl Hives  . Percocet [Oxycodone-Acetaminophen] Other (See Comments)    RESPIRATORY DISTRESS  . Penicillins Hives    Has patient had a PCN reaction causing immediate rash, facial/tongue/throat swelling, SOB or lightheadedness with hypotension: no Has patient had a PCN reaction causing severe rash involving mucus membranes or skin necrosis: no Has patient had a PCN reaction that required hospitalization no Has patient had a PCN reaction occurring within the last 10 years:  no If all of the above answers are "NO", then may proceed with Cephalosporin use.  . Sulfa Antibiotics Hives  . Tape Other (See Comments)    Adhesives - Causes Blisters   No band aids, and plastic tape   . Tetanus Toxoids Hives    Social History  Substance Use Topics  . Smoking status: Former Smoker    Packs/day: 1.00    Years: 10.00    Types: Cigarettes    Quit date: 09/22/1982  . Smokeless tobacco: Never Used     Comment: quit 1983  . Alcohol use No    Family History  Problem Relation Age of Onset  . Hypertension Father   . Heart disease Father   . Diabetes Father   . Cancer Father  colon, prostate, and kidney  . Hypertension Mother   . Parkinson's disease Mother   . Heart disease Maternal Grandmother   . Diabetes Maternal Grandmother   . Heart disease Maternal Grandfather   . Diabetes Maternal Grandfather   . Cancer Maternal Grandfather     pt unaware of what kind  . Stroke Paternal Grandmother   . Stroke Paternal Grandfather      Review of Systems  Musculoskeletal: Positive for joint pain.  All other systems reviewed and are negative.   Objective:  Physical Exam  Constitutional: She is oriented to person, place, and time. She appears well-developed and well-nourished.  HENT:  Head: Normocephalic and atraumatic.  Eyes: EOM are normal. Pupils are equal, round, and reactive to light.  Neck: Normal range of motion. Neck supple.  Cardiovascular: Normal rate and regular rhythm.   Respiratory: Effort normal and breath sounds normal.  GI: Soft. Bowel sounds are normal.  Musculoskeletal:       Right hip: She exhibits decreased range of motion, decreased strength, tenderness and bony tenderness.  Neurological: She is alert and oriented to person, place, and time.  Skin: Skin is warm and dry.  Psychiatric: She has a normal mood and affect.    Vital signs in last 24 hours: Temp:  [97.6 F (36.4 C)] 97.6 F (36.4 C) (01/26 0604) Pulse Rate:  [100] 100  (01/26 0604) Resp:  [18] 18 (01/26 0604) BP: (148)/(73) 148/73 (01/26 0604) SpO2:  [96 %] 96 % (01/26 0604) Weight:  [220 lb (99.8 kg)] 220 lb (99.8 kg) (01/26 0629)  Labs:   Estimated body mass index is 37.76 kg/m as calculated from the following:   Height as of this encounter: 5\' 4"  (1.626 m).   Weight as of this encounter: 220 lb (99.8 kg).   Imaging Review Plain radiographs demonstrate severe degenerative joint disease of the right hip(s). The bone quality appears to be good for age and reported activity level.  Assessment/Plan:  End stage arthritis, right hip(s)  The patient history, physical examination, clinical judgement of the provider and imaging studies are consistent with end stage degenerative joint disease of the right hip(s) and total hip arthroplasty is deemed medically necessary. The treatment options including medical management, injection therapy, arthroscopy and arthroplasty were discussed at length. The risks and benefits of total hip arthroplasty were presented and reviewed. The risks due to aseptic loosening, infection, stiffness, dislocation/subluxation,  thromboembolic complications and other imponderables were discussed.  The patient acknowledged the explanation, agreed to proceed with the plan and consent was signed. Patient is being admitted for inpatient treatment for surgery, pain control, PT, OT, prophylactic antibiotics, VTE prophylaxis, progressive ambulation and ADL's and discharge planning.The patient is planning to be discharged home with home health services

## 2016-11-26 NOTE — Anesthesia Postprocedure Evaluation (Signed)
Anesthesia Post Note  Patient: Lauren [name]  Procedure(s) Performed: Procedure(s) (LRB): RIGHT TOTAL HIP ARTHROPLASTY ANTERIOR APPROACH (Right)  Patient location during evaluation: PACU Anesthesia Type: Spinal Level of consciousness: oriented and awake and alert Pain management: pain level controlled Vital Signs Assessment: post-procedure vital signs reviewed and stable Respiratory status: spontaneous breathing, respiratory function stable and patient connected to nasal cannula oxygen Cardiovascular status: blood pressure returned to baseline and stable Postop Assessment: no headache and no backache Anesthetic complications: no       Last Vitals:  Vitals:   11/26/16 0939 11/26/16 0945  BP:  (!) 93/49  Pulse: 73 69  Resp: 10 12  Temp:      Last Pain:  Vitals:   11/26/16 0629  TempSrc:   PainSc: 2                  Shaquinta Peruski S

## 2016-11-26 NOTE — Transfer of Care (Signed)
Immediate Anesthesia Transfer of Care Note  Patient: Lauren [name]  Procedure(s) Performed: Procedure(s): RIGHT TOTAL HIP ARTHROPLASTY ANTERIOR APPROACH (Right)  Patient Location: PACU  Anesthesia Type:Spinal  Level of Consciousness: awake, alert  and patient cooperative  Airway & Oxygen Therapy: Patient Spontanous Breathing and Patient connected to face mask oxygen  Post-op Assessment: Report given to RN and Post -op Vital signs reviewed and stable  Post vital signs: Reviewed and stable  Last Vitals:  Vitals:   11/26/16 0604 11/26/16 0907  BP: (!) 148/73 (!) 93/53  Pulse: 100   Resp: 18 15  Temp: 36.4 C 36.8 C    Last Pain:  Vitals:   11/26/16 0629  TempSrc:   PainSc: 2       Patients Stated Pain Goal: 4 ([id number])  Complications: No apparent anesthesia complications

## 2016-11-26 NOTE — Brief Op Note (Signed)
11/26/2016  8:55 AM  PATIENT:  Lauren [name]  71 y.o. female  PRE-OPERATIVE DIAGNOSIS:  osteoarthritis right hip  POST-OPERATIVE DIAGNOSIS:  osteoarthritis right hip  PROCEDURE:  Procedure(s): RIGHT TOTAL HIP ARTHROPLASTY ANTERIOR APPROACH (Right)  SURGEON:  Surgeon(s) and Role:    * Mcarthur Rossetti, MD - Primary  PHYSICIAN ASSISTANT: Benita Stabile, PA-C  ANESTHESIA:   spinal  EBL:  Total I/O In: 1000 [I.V.:1000] Out: 250 [Blood:250]  COUNTS:  YES  DICTATION: .Other Dictation: Dictation Number [id number]  PLAN OF CARE: Admit to inpatient   PATIENT DISPOSITION:  PACU - hemodynamically stable.   Delay start of Pharmacological VTE agent (>24hrs) due to surgical blood loss or risk of bleeding: no

## 2016-11-26 NOTE — Anesthesia Procedure Notes (Signed)
Spinal  Patient location during procedure: OR Staffing Anesthesiologist: Kynzleigh Bandel, [place] Performed: anesthesiologist  Preanesthetic Checklist Completed: patient identified, site marked, surgical consent, pre-op evaluation, timeout performed, IV checked, risks and benefits discussed and monitors and equipment checked Spinal Block Patient position: sitting Prep: Betadine Patient monitoring: heart rate, continuous pulse ox and blood pressure Location: L3-4 Injection technique: single-shot Needle Needle type: Sprotte  Needle gauge: 22 G Needle length: 9 cm Additional Notes Expiration date of kit checked and confirmed. Patient tolerated procedure well, without complications.

## 2016-11-27 LAB — CBC
HEMATOCRIT: 32.5 % — AB (ref 36.0–46.0)
HEMOGLOBIN: 10.9 g/dL — AB (ref 12.0–15.0)
MCH: 28.5 pg (ref 26.0–34.0)
MCHC: 33.5 g/dL (ref 30.0–36.0)
MCV: 85.1 fL (ref 78.0–100.0)
Platelets: 270 10*3/uL (ref 150–400)
RBC: 3.82 MIL/uL — ABNORMAL LOW (ref 3.87–5.11)
RDW: 14.3 % (ref 11.5–15.5)
WBC: 17.8 10*3/uL — ABNORMAL HIGH (ref 4.0–10.5)

## 2016-11-27 LAB — GLUCOSE, CAPILLARY
GLUCOSE-CAPILLARY: 170 mg/dL — AB (ref 65–99)
GLUCOSE-CAPILLARY: 143 mg/dL — AB (ref 65–99)
Glucose-Capillary: 144 mg/dL — ABNORMAL HIGH (ref 65–99)
Glucose-Capillary: 101 mg/dL — ABNORMAL HIGH (ref 65–99)

## 2016-11-27 LAB — BASIC METABOLIC PANEL
Anion gap: 6 (ref 5–15)
BUN: 14 mg/dL (ref 6–20)
CHLORIDE: 106 mmol/L (ref 101–111)
CO2: 26 mmol/L (ref 22–32)
CREATININE: 0.87 mg/dL (ref 0.44–1.00)
Calcium: 8.9 mg/dL (ref 8.9–10.3)
GFR calc non Af Amer: >60 mL/min (ref >60)
GLUCOSE: 144 mg/dL — AB (ref 65–99)
Potassium: 5.1 mmol/L (ref 3.5–5.1)
Sodium: 138 mmol/L (ref 135–145)

## 2016-11-27 MED ORDER — HYDROCODONE-ACETAMINOPHEN 5-325 MG PO TABS: 1.0000 | ORAL_TABLET | ORAL | 0 refills | Status: DC | PRN

## 2016-11-27 MED ORDER — ASPIRIN EC 81 MG PO TBEC: 81.0000 mg | DELAYED_RELEASE_TABLET | Freq: Two times a day (BID) | ORAL | 0 refills | Status: DC

## 2016-11-27 MED ORDER — METHOCARBAMOL 500 MG PO TABS: 500.0000 mg | ORAL_TABLET | Freq: Four times a day (QID) | ORAL | 0 refills | Status: DC | PRN

## 2016-11-27 NOTE — Progress Notes (Signed)
   Subjective: 1 Day Post-Op Procedure(s) (LRB): RIGHT TOTAL HIP ARTHROPLASTY ANTERIOR APPROACH (Right) Patient reports pain as mild and moderate.    Objective: Vital signs in last 24 hours: Temp:  [97.8 F (36.6 C)-98 F (36.7 C)] 98 F (36.7 C) (01/27 0531) Pulse Rate:  [66-82] 66 (01/27 0531) Resp:  [10-22] 16 (01/27 0531) BP: (90-116)/(49-70) 102/49 (01/27 0531) SpO2:  [94 %-100 %] 97 % (01/27 0531)  Intake/Output from previous day: 01/26 0701 - 01/27 0700 In: 4271.3 [P.O.:600; I.V.:3411.3; IV Piggyback:260] Out: 3625 [Urine:3375; Blood:250] Intake/Output this shift: Total I/O In: 360 [P.O.:360] Out: -    Recent Labs  11/27/16 0457  HGB 10.9*    Recent Labs  11/27/16 0457  WBC 17.8*  RBC 3.82*  HCT 32.5*  PLT 270    Recent Labs  11/27/16 0457  NA 138  K 5.1  CL 106  CO2 26  BUN 14  CREATININE 0.87  GLUCOSE 144*  CALCIUM 8.9   No results for input(s): LABPT, INR in the last 72 hours.  Neurologically intact No results found.  Assessment/Plan: 1 Day Post-Op Procedure(s) (LRB): RIGHT TOTAL HIP ARTHROPLASTY ANTERIOR APPROACH (Right) Up with therapy  likely home either late today or in AM.  Lauren [name] 11/27/2016, 9:53 AM

## 2016-11-27 NOTE — Discharge Instructions (Signed)

## 2016-11-27 NOTE — Progress Notes (Signed)
Physical Therapy Treatment Patient Details Name: Lauren [name] MRN: [id number] DOB: February 17, 1946 Today's Date: 11/27/2016    History of Present Illness Pt s/p R THR    PT Comments    Pt motivated and progressing well with mobility.  Follow Up Recommendations  Home health PT     Equipment Recommendations  Rolling walker with 5" wheels    Recommendations for Other Services OT consult     Precautions / Restrictions Precautions Precautions: Fall Restrictions Weight Bearing Restrictions: No Other Position/Activity Restrictions: WBAT    Mobility  Bed Mobility Overal bed mobility: Needs Assistance Bed Mobility: Supine to Sit     Supine to sit: Min assist     General bed mobility comments: cues for sequence and use of L LE to self assist  Transfers Overall transfer level: Needs assistance Equipment used: Rolling walker (2 wheeled) Transfers: Sit to/from Stand Sit to Stand: Min assist Stand pivot transfers: Min guard       General transfer comment: cues for LE management and use of UEs to self assist  Ambulation/Gait Ambulation/Gait assistance: Min assist Ambulation Distance (Feet): 123 Feet Assistive device: Rolling walker (2 wheeled) Gait Pattern/deviations: Decreased step length - right;Decreased step length - left;Shuffle;Trunk flexed;Step-to pattern;Step-through pattern Gait velocity: decr Gait velocity interpretation: Below normal speed for age/gender General Gait Details: cues for posture, position from RW and sequence   Stairs            Wheelchair Mobility    Modified Rankin (Stroke Patients Only)       Balance                                    Cognition Arousal/Alertness: Awake/alert Behavior During Therapy: WFL for tasks assessed/performed Overall Cognitive Status: Within Functional Limits for tasks assessed                      Exercises Total Joint Exercises Ankle Circles/Pumps: AROM;Both;15  reps;Supine Quad Sets: AROM;Both;Supine;10 reps Heel Slides: AAROM;Right;20 reps;Supine Hip ABduction/ADduction: AAROM;Right;15 reps;Supine    General Comments        Pertinent Vitals/Pain Pain Assessment: 0-10 Pain Score: 4  Pain Location: r hip Pain Descriptors / Indicators: Aching;Sore Pain Intervention(s): Limited activity within patient's tolerance;Monitored during session;Premedicated before session;Ice applied    Home Living Family/patient expects to be discharged to:: Private residence Living Arrangements: Spouse/significant other Available Help at Discharge: Family Type of Home: House Home Access: Stairs to enter Entrance Stairs-Rails: Left;Right Home Layout: One level Home Equipment: [place] - single point;Toilet riser      Prior Function Level of Independence: Independent;Independent with assistive device(s)          PT Goals (current goals can now be found in the care plan section) Acute Rehab PT Goals Patient Stated Goal: Regain IND PT Goal Formulation: With patient Time For Goal Achievement: 11/30/16 Potential to Achieve Goals: Good Progress towards PT goals: Progressing toward goals    Frequency    7X/week      PT Plan Current plan remains appropriate    Co-evaluation             End of Session Equipment Utilized During Treatment: Gait belt Activity Tolerance: Patient tolerated treatment well Patient left: in chair;with call bell/phone within reach     Time: 0801-0840 PT Time Calculation (min) (ACUTE ONLY): 39 min  Charges:  $Gait Training: 8-22 mins $Therapeutic Exercise: 8-22 mins $Therapeutic  Activity: 8-22 mins                    G Codes:      Liam Cammarata Dec 17, 2016, 11:17 AM

## 2016-11-27 NOTE — Evaluation (Signed)
Occupational Therapy Evaluation Patient Details Name: Lauren [name] MRN: [id number] DOB: 1946-01-27 Today's Date: 11/27/2016    History of Present Illness Pt s/p R THR   Clinical Impression   OT education complete   Follow Up Recommendations  No OT follow up    Equipment Recommendations  None recommended by OT    Recommendations for Other Services       Precautions / Restrictions Precautions Precautions: Fall Restrictions Weight Bearing Restrictions: No Other Position/Activity Restrictions: WBAT      Mobility Bed Mobility               General bed mobility comments: pt in chair  Transfers Overall transfer level: Needs assistance Equipment used: Rolling walker (2 wheeled) Transfers: Sit to/from [employer] Transfers   Stand pivot transfers: Min guard                 ADL Overall ADL's : Needs assistance/impaired                     Lower Body Dressing: Minimal assistance;Sit to/from stand;Cueing for safety;Cueing for sequencing Lower Body Dressing Details (indicate cue type and reason): husband will A as needed- education provided Toilet Transfer: Min guard;RW;Ambulation;Cueing for sequencing;Cueing for safety   Toileting- Clothing Manipulation and Hygiene: Supervision/safety;Sit to/from stand;Cueing for safety;Cueing for sequencing   Tub/ Shower Transfer: Copy Details (indicate cue type and reason): verbalized safety    General ADL Comments: reccomend walker bag for pt to ensure both hands on walker               Pertinent Vitals/Pain Pain Score: 3  Pain Location: r hip Pain Descriptors / Indicators: Aching;Sore Pain Intervention(s): Monitored during session     Hand Dominance     Extremity/Trunk Assessment         Cervical / Trunk Assessment Cervical / Trunk Assessment: Normal   Communication Communication Communication: No difficulties   Cognition Arousal/Alertness:  Awake/alert Behavior During Therapy: WFL for tasks assessed/performed Overall Cognitive Status: Within Functional Limits for tasks assessed                                Home Living Family/patient expects to be discharged to:: Private residence Living Arrangements: Spouse/significant other Available Help at Discharge: Family Type of Home: House Home Access: Stairs to enter Technical brewer of Steps: 4 Entrance Stairs-Rails: Left;Right Home Layout: One level     Bathroom Shower/Tub: Occupational psychologist: Standard     Home Equipment: Cane - single point;Toilet riser          Prior Functioning/Environment Level of Independence: Independent;Independent with assistive device(s)                      OT Goals(Current goals can be found in the care plan section) Acute Rehab OT Goals Patient Stated Goal: Regain IND  OT Frequency:     Barriers to D/C:               End of Session Equipment Utilized During Treatment: Rolling walker Nurse Communication: Mobility status  Activity Tolerance: Patient tolerated treatment well Patient left: in chair   Time: 1009-1033 OT Time Calculation (min): 24 min Charges:  OT General Charges $OT Visit: 1 Procedure OT Evaluation $OT Eval Low Complexity: 1 Procedure OT Treatments $Self Care/Home Management : 8-22 mins G-Codes:    Payton Mccallum  D 11/27/2016, 10:42 AM

## 2016-11-27 NOTE — Progress Notes (Signed)
Physical Therapy Treatment Patient Details Name: Lauren [name] MRN: [id number] DOB: April 03, 1946 Today's Date: 11/27/2016    History of Present Illness Pt s/p R THR    PT Comments    Pt progressing well with mobility and eager for dc home tomorrow  Follow Up Recommendations  Home health PT     Equipment Recommendations  Rolling walker with 5" wheels    Recommendations for Other Services OT consult     Precautions / Restrictions Precautions Precautions: Fall Restrictions Weight Bearing Restrictions: No Other Position/Activity Restrictions: WBAT    Mobility  Bed Mobility Overal bed mobility: Needs Assistance Bed Mobility: Supine to Sit;Sit to Supine     Supine to sit: Min assist Sit to supine: Min assist   General bed mobility comments: cues for sequence and use of L LE to self assist  Transfers Overall transfer level: Needs assistance Equipment used: Rolling walker (2 wheeled) Transfers: Sit to/from Stand Sit to Stand: Min guard         General transfer comment: cues for LE management and use of UEs to self assist  Ambulation/Gait Ambulation/Gait assistance: Min guard Ambulation Distance (Feet): 300 Feet Assistive device: Rolling walker (2 wheeled) Gait Pattern/deviations: Decreased step length - right;Decreased step length - left;Shuffle;Trunk flexed;Step-to pattern;Step-through pattern Gait velocity: decr Gait velocity interpretation: Below normal speed for age/gender General Gait Details: cues for posture, position from RW and sequence   Stairs Stairs: Yes   Stair Management: One rail Right;Step to pattern;Forwards;With cane Number of Stairs: 8 General stair comments: 4 steps twice with  cues for sequence and foot/cane placement.  spouse assisting on second attempt  Wheelchair Mobility    Modified Rankin (Stroke Patients Only)       Balance Overall balance assessment: No apparent balance deficits (not formally assessed)                                   Cognition Arousal/Alertness: Awake/alert Behavior During Therapy: WFL for tasks assessed/performed Overall Cognitive Status: Within Functional Limits for tasks assessed                      Exercises      General Comments        Pertinent Vitals/Pain Pain Assessment: 0-10 Pain Score: 4  Pain Location: r hip Pain Descriptors / Indicators: Aching;Sore Pain Intervention(s): Limited activity within patient's tolerance;Monitored during session;Premedicated before session;Ice applied    Home Living                      Prior Function            PT Goals (current goals can now be found in the care plan section) Acute Rehab PT Goals Patient Stated Goal: Regain IND PT Goal Formulation: With patient Time For Goal Achievement: 11/30/16 Potential to Achieve Goals: Good Progress towards PT goals: Progressing toward goals    Frequency    7X/week      PT Plan Current plan remains appropriate    Co-evaluation             End of Session Equipment Utilized During Treatment: Gait belt Activity Tolerance: Patient tolerated treatment well Patient left: in bed;with call bell/phone within reach;with family/visitor present     Time: [id number] PT Time Calculation (min) (ACUTE ONLY): 37 min  Charges:  $Gait Training: 8-22 mins $Therapeutic Activity: 8-22 mins  G Codes:      Lauren [name] 12/23/16, 3:49 PM

## 2016-11-28 LAB — CBC
HEMATOCRIT: 31.6 % — AB (ref 36.0–46.0)
HEMOGLOBIN: 10.1 g/dL — AB (ref 12.0–15.0)
MCH: 27 pg (ref 26.0–34.0)
MCHC: 32 g/dL (ref 30.0–36.0)
MCV: 84.5 fL (ref 78.0–100.0)
Platelets: 271 10*3/uL (ref 150–400)
RBC: 3.74 MIL/uL — AB (ref 3.87–5.11)
RDW: 14.5 % (ref 11.5–15.5)
WBC: 13.3 10*3/uL — ABNORMAL HIGH (ref 4.0–10.5)

## 2016-11-28 LAB — GLUCOSE, CAPILLARY: GLUCOSE-CAPILLARY: 88 mg/dL (ref 65–99)

## 2016-11-28 NOTE — Progress Notes (Signed)
Dc instructions provided to patient in detail; prescriptions given to patient prior to dc. Pt escorted to lobby in wheelchair   by RN. Assisted to car with no difficulties

## 2016-11-28 NOTE — Progress Notes (Signed)
   Subjective: 2 Days Post-Op Procedure(s) (LRB): RIGHT TOTAL HIP ARTHROPLASTY ANTERIOR APPROACH (Right) Patient reports pain as mild.    Objective: Vital signs in last 24 hours: Temp:  [97.8 F (36.6 C)-98 F (36.7 C)] 98 F (36.7 C) (01/28 0607) Pulse Rate:  [72-76] 76 (01/28 0607) Resp:  [18-20] 18 (01/28 0607) BP: (111-129)/(47-60) 118/55 (01/28 0607) SpO2:  [94 %-100 %] 94 % (01/28 0607)  Intake/Output from previous day: 01/27 0701 - 01/28 0700 In: 1080 [P.O.:1080] Out: 2000 [Urine:2000] Intake/Output this shift: No intake/output data recorded.   Recent Labs  11/27/16 0457 11/28/16 0442  HGB 10.9* 10.1*    Recent Labs  11/27/16 0457 11/28/16 0442  WBC 17.8* 13.3*  RBC 3.82* 3.74*  HCT 32.5* 31.6*  PLT 270 271    Recent Labs  11/27/16 0457  NA 138  K 5.1  CL 106  CO2 26  BUN 14  CREATININE 0.87  GLUCOSE 144*  CALCIUM 8.9   No results for input(s): LABPT, INR in the last 72 hours.  Neurologically intact No results found.  Assessment/Plan: 2 Days Post-Op Procedure(s) (LRB): RIGHT TOTAL HIP ARTHROPLASTY ANTERIOR APPROACH (Right) Plan:  Discharge home , did stairs , pain controlled. rx on chart  Lauren [name] 11/28/2016, 9:30 AM

## 2016-11-28 NOTE — Progress Notes (Signed)
Physical Therapy Treatment Patient Details Name: Lauren [name] MRN: [id number] DOB: 07/31/1946 Today's Date: 11/28/2016    History of Present Illness Pt s/p R THR    PT Comments    Pt progressing well with mobility and eager for dc home.  Reviewed stairs, therex and car transfers.  Follow Up Recommendations  Home health PT     Equipment Recommendations  Rolling walker with 5" wheels    Recommendations for Other Services OT consult     Precautions / Restrictions Precautions Precautions: Fall Restrictions Weight Bearing Restrictions: No Other Position/Activity Restrictions: WBAT    Mobility  Bed Mobility Overal bed mobility: Needs Assistance Bed Mobility: Supine to Sit     Supine to sit: Supervision     General bed mobility comments: increased time with min cues for sequence and use of L LE to self assist  Transfers Overall transfer level: Needs assistance Equipment used: Rolling walker (2 wheeled) Transfers: Sit to/from Stand Sit to Stand: Supervision         General transfer comment: Pt self cued for LE management and use of UEs to self assist  Ambulation/Gait Ambulation/Gait assistance: Min guard;Supervision Ambulation Distance (Feet): 300 Feet Assistive device: Rolling walker (2 wheeled) Gait Pattern/deviations: Decreased step length - right;Decreased step length - left;Shuffle;Trunk flexed;Step-to pattern;Step-through pattern Gait velocity: decr Gait velocity interpretation: Below normal speed for age/gender General Gait Details: min cues for posture, position from RW and sequence   Stairs Stairs: Yes   Stair Management: One rail Right;Step to pattern;Forwards;With cane Number of Stairs: 4 General stair comments: min cues for sequence and foot/cane placement.  spouse assisting on second attempt  Wheelchair Mobility    Modified Rankin (Stroke Patients Only)       Balance Overall balance assessment: No apparent balance deficits (not  formally assessed)                                  Cognition Arousal/Alertness: Awake/alert Behavior During Therapy: WFL for tasks assessed/performed Overall Cognitive Status: Within Functional Limits for tasks assessed                      Exercises Total Joint Exercises Ankle Circles/Pumps: AROM;Both;15 reps;Supine Quad Sets: AROM;Both;Supine;10 reps Heel Slides: AAROM;Right;20 reps;Supine Hip ABduction/ADduction: AAROM;Right;15 reps;Supine Long Arc Quad: AROM;Right;10 reps;Seated    General Comments        Pertinent Vitals/Pain Pain Assessment: 0-10 Pain Score: 4  Pain Location: r hip Pain Descriptors / Indicators: Aching;Sore Pain Intervention(s): Limited activity within patient's tolerance;Monitored during session;Premedicated before session;Ice applied    Home Living                      Prior Function            PT Goals (current goals can now be found in the care plan section) Acute Rehab PT Goals Patient Stated Goal: Regain IND PT Goal Formulation: With patient Time For Goal Achievement: 11/30/16 Potential to Achieve Goals: Good Progress towards PT goals: Progressing toward goals    Frequency    7X/week      PT Plan Current plan remains appropriate    Co-evaluation             End of Session Equipment Utilized During Treatment: Gait belt Activity Tolerance: Patient tolerated treatment well Patient left: in chair;with family/visitor present;with call bell/phone within reach     Time: [phone]  PT Time Calculation (min) (ACUTE ONLY): 38 min  Charges:  $Gait Training: 8-22 mins $Therapeutic Exercise: 8-22 mins $Therapeutic Activity: 8-22 mins                    G Codes:      Lauren [name] 12/12/16, 11:44 AM

## 2016-11-28 NOTE — Op Note (Signed)
NAME:  [name], Lauren                 ACCOUNT NO.:  MEDICAL RECORD NO.:  [id number]  LOCATION:                                 FACILITY:  PHYSICIAN:  Lind Guest. [name], M.D.DATE OF BIRTH:  14-Apr-1946  DATE OF PROCEDURE:  11/26/2016 DATE OF DISCHARGE:                              OPERATIVE REPORT   PREOPERATIVE DIAGNOSES:  Primary end-stage arthritis and degenerative joint disease, right hip.  POSTOPERATIVE DIAGNOSES:  Primary end-stage arthritis and degenerative joint disease, right hip.  PROCEDURE:  Right total hip arthroplasty through direct anterior approach.  IMPLANTS:  DePuy Sector Gription acetabular component size 50, size 32+ 4 polyethylene liner, size 12 Corail femoral component with standard offset, size 32+ 5 ceramic hip ball.  SURGEON:  Lind Guest. [name], MD  ASSISTANT:  Erskine Emery, PA-C.  ANESTHESIA:  Spinal.  BLOOD LOSS:  300 mL.  ANTIBIOTICS:  900 mg of IV clindamycin.  COMPLICATIONS:  None.  INDICATIONS:  Lauren [name] is a 71 year old female with debilitating arthritis involving her right hip that has significantly worsened over the last year.  She ambulates a lot of times with assisted device.  Her right leg is shorter.  Her pain is daily and it is detrimentally affected her activities of daily living, her quality of life, and her mobility.  At this point, she does wish to proceed with a total hip arthroplasty.  We talked about the risks of acute blood loss anemia, nerve and vessel injury, fracture, infection, dislocation, DVT.  She understands our goals are decreased pain, improved mobility, and overall improved quality of life.  PROCEDURE DESCRIPTION:  After informed consent was obtained, appropriate right hip was marked.  She was brought to the operating room.  Spinal anesthesia was obtained while she was on her stretcher.  A Foley catheter was placed.  Then, both feet had traction boots applied to them.  She was next placed  supine on the Hana fracture table with perineal post in place and both legs in inline skeletal traction devices, but no traction applied.  Her right operative hip was then prepped and draped with DuraPrep and sterile drapes.  A time-out was called.  She was identified as correct patient and correct right hip. We then made incision just inferior and posterior to the anterior superior iliac spine and carried this obliquely down the leg.  We dissected down the tensor fascia lata muscle and tensor fascia was then divided longitudinally to proceed with a direct anterior approach to the hip.  We identified and cauterized the circumflex vessels.  I then identified the hip capsule.  I opened up the hip capsule in an L-type format finding a very large joint effusion and significant periarticular osteophytes.  We placed Cobra retractors around the medial and lateral femoral neck and then made our femoral neck cut with an oscillating saw and completed this on osteotome.  We placed a corkscrew guide in the femoral head and removed the femoral head its entirety.  We found it to be completely devoid of cartilage.  We then bent a Hohmann over the medial acetabular rim.  We removed remnants of acetabular labrum.  I then began reaming under direct visualization  from a size 43 reamer going all the way up to a size 50 with all reamers under direct visualization, the last reamer under direct fluoroscopy, so I could obtain my depth of reaming, our inclination, and anteversion.  Once I was pleased with this, I placed the real DePuy Sector Gription acetabular component size 50 and a 32+ 4 polyethylene liner to assist with our offset and leg lengths since she was started off so short.  We then externally rotated the leg to 120 degrees, extended an abducted it. All traction was off the leg.  We placed a Mueller retractor medially and Hohmann retractor behind the greater trochanter.  We released the lateral joint  capsule and used a box cutting osteotome to enter femoral canal and a rongeur to lateralize.  I then began broaching using the Corail broaching system from a size 8 broach going up to a size 12. With a size 12 in place, we trialed a standard offset femoral neck and a 32+ 1 hip ball.  Rolled leg back over and up with traction and internal rotation reducing the pelvis.  She was very stable, but I thought we could increase her leg length and offset just a little bit more.  We dislocated the hip and removed the trial components.  We placed the real Corail femoral component size 12 with standard offset without difficulty.  We then with a real 32+ 5 ceramic hip ball, we brought the leg back over and up with traction and rotation reducing the pelvis and we were pleased with the range of motion offset and leg length as well as stability.  We then irrigated the soft tissue and normal saline solution.  We closed the joint capsule interrupted #1 Ethibond suture followed by running #1 Vicryl tensor fascia, 0 Vicryl in the deep tissue, 2-0 Vicryl subcutaneous tissue, interrupted staples on the skin. Xeroform and Aquacel dressing were applied.  She was taken off the Hana table and taken to the recovery room in stable condition.  All final counts were correct.  There were no complications noted.  Of note, Erskine Emery, PA-C assisted in the entire case.  His assistance was crucial for facilitating all aspects of this case.     Lind Guest. [name], M.D.     CYB/MEDQ  D:  11/26/2016  T:  11/27/2016  Job:  [id number]

## 2016-11-30 ENCOUNTER — Telehealth (INDEPENDENT_AMBULATORY_CARE_PROVIDER_SITE_OTHER): Payer: Self-pay | Admitting: *Deleted

## 2016-11-30 DIAGNOSIS — E1140 Type 2 diabetes mellitus with diabetic neuropathy, unspecified: Secondary | ICD-10-CM | POA: Diagnosis not present

## 2016-11-30 DIAGNOSIS — Z7984 Long term (current) use of oral hypoglycemic drugs: Secondary | ICD-10-CM | POA: Diagnosis not present

## 2016-11-30 DIAGNOSIS — Z96641 Presence of right artificial hip joint: Secondary | ICD-10-CM | POA: Diagnosis not present

## 2016-11-30 DIAGNOSIS — Z471 Aftercare following joint replacement surgery: Secondary | ICD-10-CM | POA: Diagnosis not present

## 2016-11-30 DIAGNOSIS — Z7982 Long term (current) use of aspirin: Secondary | ICD-10-CM | POA: Diagnosis not present

## 2016-11-30 DIAGNOSIS — I10 Essential (primary) hypertension: Secondary | ICD-10-CM | POA: Diagnosis not present

## 2016-11-30 DIAGNOSIS — Z87891 Personal history of nicotine dependence: Secondary | ICD-10-CM | POA: Diagnosis not present

## 2016-11-30 DIAGNOSIS — Z79891 Long term (current) use of opiate analgesic: Secondary | ICD-10-CM | POA: Diagnosis not present

## 2016-11-30 NOTE — Telephone Encounter (Signed)
I'm fine with TED hose if she is having lower leg/ankle/foot swelling

## 2016-11-30 NOTE — Telephone Encounter (Signed)
Therapist states she didn't come home with TED HOSE, does she need them?

## 2016-11-30 NOTE — Telephone Encounter (Signed)
Ebony Hail from [facility] a physical therapist called this afternoon in regards to needing verbal orders for physical therapy. 3 week one two week one. Thank you her CB # [phone].

## 2016-12-01 NOTE — Telephone Encounter (Signed)
Told therapist to watch the swelling that she didn't need them

## 2016-12-06 DIAGNOSIS — I10 Essential (primary) hypertension: Secondary | ICD-10-CM | POA: Diagnosis not present

## 2016-12-06 DIAGNOSIS — Z7984 Long term (current) use of oral hypoglycemic drugs: Secondary | ICD-10-CM | POA: Diagnosis not present

## 2016-12-06 DIAGNOSIS — Z79891 Long term (current) use of opiate analgesic: Secondary | ICD-10-CM | POA: Diagnosis not present

## 2016-12-06 DIAGNOSIS — E1140 Type 2 diabetes mellitus with diabetic neuropathy, unspecified: Secondary | ICD-10-CM | POA: Diagnosis not present

## 2016-12-06 DIAGNOSIS — Z7982 Long term (current) use of aspirin: Secondary | ICD-10-CM | POA: Diagnosis not present

## 2016-12-06 DIAGNOSIS — Z96641 Presence of right artificial hip joint: Secondary | ICD-10-CM | POA: Diagnosis not present

## 2016-12-06 DIAGNOSIS — Z87891 Personal history of nicotine dependence: Secondary | ICD-10-CM | POA: Diagnosis not present

## 2016-12-06 DIAGNOSIS — Z471 Aftercare following joint replacement surgery: Secondary | ICD-10-CM | POA: Diagnosis not present

## 2016-12-09 ENCOUNTER — Ambulatory Visit (INDEPENDENT_AMBULATORY_CARE_PROVIDER_SITE_OTHER): Payer: PPO | Admitting: Orthopaedic Surgery

## 2016-12-09 DIAGNOSIS — Z96641 Presence of right artificial hip joint: Secondary | ICD-10-CM

## 2016-12-09 NOTE — Discharge Summary (Signed)
Patient ID: ABBYGAEL [name] MRN: [id number] DOB/AGE: 05-24-1946 71 y.o.  Admit date: 11/26/2016 Discharge date: 12/09/2016  Admission Diagnoses:  Principal Problem:   Unilateral primary osteoarthritis, right hip Active Problems:   Status post total replacement of right hip   Discharge Diagnoses:  Principal Problem:   Unilateral primary osteoarthritis, right hip Active Problems:   Status post total replacement of right hip  status post Procedure(s): RIGHT TOTAL HIP ARTHROPLASTY ANTERIOR APPROACH  Past Medical History:  Diagnosis Date  . Arthritis    Back, Hip- right  . Diabetes mellitus without complication ([place])    Type II  . Endometriosis   . Family history of adverse reaction to anesthesia    Father - N/V - blood pressure  . GERD (gastroesophageal reflux disease)   . History of kidney stones   . History of ulcerative colitis   . Hypertension   . Insomnia   . 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 Neuropathy ([place])   . Pneumonia    1983, 2003  . Umbilical hernia   . Vitamin D deficiency     Surgeries: Procedure(s): RIGHT TOTAL HIP ARTHROPLASTY ANTERIOR APPROACH on 11/26/2016   Consultants:   Discharged Condition: Improved  Hospital Course: Lauren [name] is an 71 y.o. female who was admitted 11/26/2016 for operative treatment of Unilateral primary osteoarthritis, right hip. Patient failed conservative treatments (please see the history and physical for the specifics) and had severe unremitting pain that affects sleep, daily activities and work/hobbies. After pre-op clearance, the patient was taken to the operating room on 11/26/2016 and underwent  Procedure(s): RIGHT TOTAL HIP ARTHROPLASTY ANTERIOR APPROACH.    Patient was given perioperative antibiotics:  Anti-infectives    Start     Dose/Rate Route Frequency Ordered Stop   11/26/16 1400  clindamycin (CLEOCIN) IVPB 600 mg     600 mg 100 mL/hr over 30 Minutes Intravenous Every 6 hours 11/26/16 1217 11/26/16  2039   11/26/16 0635  clindamycin (CLEOCIN) IVPB 900 mg     900 mg 100 mL/hr over 30 Minutes Intravenous On call to O.R. 11/26/16 [id number] 11/26/16 0732       Patient was given sequential compression devices and early ambulation to prevent DVT.   Patient benefited maximally from hospital stay and there were no complications. At the time of discharge, the patient was urinating/moving their bowels without difficulty, tolerating a regular diet, pain is controlled with oral pain medications and they have been cleared by PT/OT.   Recent vital signs: No data found.    Recent laboratory studies: No results for input(s): WBC, HGB, HCT, PLT, NA, K, CL, CO2, BUN, CREATININE, GLUCOSE, INR, CALCIUM in the last 72 hours.  Invalid input(s): PT, 2   Discharge Medications:   Allergies as of 11/28/2016      Reactions   Ciprofloxacin Hcl Hives   Percocet [oxycodone-acetaminophen] Other (See Comments)   RESPIRATORY DISTRESS   Penicillins Hives   Has patient had a PCN reaction causing immediate rash, facial/tongue/throat swelling, SOB or lightheadedness with hypotension: no Has patient had a PCN reaction causing severe rash involving mucus membranes or skin necrosis: no Has patient had a PCN reaction that required hospitalization no Has patient had a PCN reaction occurring within the last 10 years: no If all of the above answers are "NO", then may proceed with Cephalosporin use.   Sulfa Antibiotics Hives   Tape Other (See Comments)   Adhesives - Causes Blisters  No band aids,  and plastic tape    Tetanus Toxoids Hives      Medication List    TAKE these medications   amLODipine 5 MG tablet Commonly known as:  NORVASC Take 1 tablet (5 mg total) by mouth daily.   ASPERCREME W/LIDOCAINE 4 % cream Generic drug:  lidocaine Apply 1 application topically daily as needed. Applies to hip-right at bedtime   aspirin EC 81 MG tablet Take 1 tablet (81 mg total) by mouth 2 (two) times daily after a  meal. What changed:  when to take this   b complex vitamins tablet Take 1 tablet by mouth daily.   balsalazide 750 MG capsule Commonly known as:  COLAZAL Take 2-3 capsules by mouth See admin instructions. 3 capsules in the morning, and 2-3 capsules at bedtime   cholestyramine 4 GM/DOSE powder Commonly known as:  QUESTRAN Take 1 g by mouth daily. Reported on 11/05/2015   fluticasone 50 MCG/ACT nasal spray Commonly known as:  FLONASE USE 1-2 SPRAYS IN EACH NOSTRIL DAILY AS NEEDED FOR ALLERGIES   furosemide 20 MG tablet Commonly known as:  LASIX Take 20 mg by mouth daily.   gabapentin 300 MG capsule Commonly known as:  NEURONTIN Take 300 mg by mouth at bedtime.   glimepiride 4 MG tablet Commonly known as:  AMARYL Take 4 mg by mouth daily with breakfast.   HYDROcodone-acetaminophen 5-325 MG tablet Commonly known as:  NORCO/VICODIN Take 1-2 tablets by mouth every 4 (four) hours as needed (pain). Reported on 11/05/2015 What changed:  how much to take  when to take this   [place] Take 1 capsule by mouth daily. ARTHROCEN 300 mg   magnesium gluconate 500 MG tablet Commonly known as:  MAGONATE Take 500 mg by mouth at bedtime.   metFORMIN 500 MG 24 hr tablet Commonly known as:  GLUCOPHAGE-XR Take 1,000 mg by mouth 2 (two) times daily.   methocarbamol 500 MG tablet Commonly known as:  ROBAXIN Take 1 tablet (500 mg total) by mouth every 6 (six) hours as needed for muscle spasms.   pantoprazole 40 MG tablet Commonly known as:  PROTONIX Take 40 mg by mouth 2 (two) times daily.   quinapril 40 MG tablet Commonly known as:  ACCUPRIL Take 40 mg by mouth daily.   rosuvastatin 20 MG tablet Commonly known as:  CRESTOR Take 1 tablet (20 mg total) by mouth daily.   traMADol 50 MG tablet Commonly known as:  ULTRAM Take 2 tablets by mouth 2 (two) times daily as needed for moderate pain (pain).   Vitamin D 2000 units tablet Take 4,000 Units by mouth daily.        Diagnostic Studies: Dg Pelvis Portable  Result Date: 11/26/2016 CLINICAL DATA:  Status post right hip replacement EXAM: PORTABLE PELVIS 1-2 VIEWS COMPARISON:  None. FINDINGS: Right hip replacement is noted in satisfactory position. No acute bony or soft tissue abnormality is noted. IMPRESSION: Status post right hip replacement Electronically Signed   By: Inez Catalina M.D.   On: 11/26/2016 09:36   Dg C-arm 1-60 Min-no Report  Result Date: 11/26/2016 There is no Radiologist interpretation  for this exam.  Dg Hip Operative Unilat With Pelvis Right  Result Date: 11/26/2016 CLINICAL DATA:  Right total hip replacement EXAM: OPERATIVE RIGHT HIP (WITH PELVIS IF PERFORMED) 2 VIEWS TECHNIQUE: Fluoroscopic spot image(s) were submitted for interpretation post-operatively. COMPARISON:  06/10/2015 FINDINGS: Changes of right hip replacement. Normal AP alignment. No hardware bony complicating feature. IMPRESSION: Right hip replacement.  No visible complicating feature. Electronically Signed   By: Rolm Baptise M.D.   On: 11/26/2016 09:31      Follow-up Information    Mcarthur Rossetti, MD Follow up in 2 week(s).   Specialty:  Orthopedic Surgery Contact information: [place] [address] [phone]        [facility] Follow up.   Specialty:  [place] Why:  Home Health Physical Therapy Contact information: [address] [place] [address] [phone]           Discharge Plan:  discharge to home  Disposition:     Signed: Benjiman Core  12/09/2016, 9:26 AM

## 2016-12-09 NOTE — Progress Notes (Signed)
The patient is a 71 year old who is now 2 weeks status post a right total hip arthroplasty through direct anterior approach. She is and living with just a cane. She's been released more from home therapy. She is doing well overall.  On examination her incision looks good. I removed the staples and placed Steri-Strips. She does have a moderate seroma and I was able to aspirate 70 mL of fluid from around the hip. This gave her good relief. There is no evidence of infection. Her leg lengths appear equal.  She'll continue increase her activities as he tolerates. She'll go back down to an aspirin once a day. I'll see her back in a month to see how she is doing overall but no x-rays are needed.

## 2016-12-10 ENCOUNTER — Telehealth (INDEPENDENT_AMBULATORY_CARE_PROVIDER_SITE_OTHER): Payer: Self-pay | Admitting: *Deleted

## 2016-12-10 NOTE — Telephone Encounter (Signed)
Pt was supposed to be seen today for discharge but was not seen due to being sick. Needs new order for 1 week 1.

## 2016-12-10 NOTE — Telephone Encounter (Signed)
Verbal order given to Allison.  

## 2016-12-15 ENCOUNTER — Encounter (INDEPENDENT_AMBULATORY_CARE_PROVIDER_SITE_OTHER): Payer: Self-pay | Admitting: Physician Assistant

## 2016-12-15 ENCOUNTER — Ambulatory Visit (INDEPENDENT_AMBULATORY_CARE_PROVIDER_SITE_OTHER): Payer: PPO | Admitting: Physician Assistant

## 2016-12-15 DIAGNOSIS — Z96641 Presence of right artificial hip joint: Secondary | ICD-10-CM

## 2016-12-15 NOTE — Progress Notes (Signed)
Office Visit Note   Patient: Lauren [name]           Date of Birth: 12-Nov-1945           MRN: [id number] Visit Date: 12/15/2016              Requested by: Lawerance Cruel, MD [place], [place] [address] PCP: Melinda Crutch, MD   Assessment & Plan: Visit Diagnoses:  1. Status post total replacement of right hip     Plan: Keep working on leg lengthening gait and balance. 3 weeks sooner if she develops another seroma. Seroma was  aspirated 80 mL total.  Follow-Up Instructions: Return in about 3 weeks (around 01/05/2017) for wound check, post op.   Orders:  No orders of the defined types were placed in this encounter.  No orders of the defined types were placed in this encounter.     Procedures: No procedures performed   Clinical Data: No additional findings.   Subjective: Chief Complaint  Patient presents with  . Right Hip - Follow-up    HPI Bartman returns today now almost 3 weeks status post right total hip arthroplasty anterior approach. She has developed recurrence of her seroma she is asking for aspiration. Otherwise doing well Review of Systems   Objective: Vital Signs: There were no vitals taken for this visit.  Physical Exam  Ortho Exam Overall good range of motion. She has definite seroma. Surgical incision is well-healed Specialty Comments:  No specialty comments available.  Imaging: No results found.   PMFS History: Patient Active Problem List   Diagnosis Date Noted  . Unilateral primary osteoarthritis, right hip 11/26/2016  . Status post total replacement of right hip 11/26/2016  . Hiatal hernia 04/13/2013  . Type II or unspecified type diabetes mellitus without mention of complication, uncontrolled 01/26/2013  . Umbilical hernia [id number]  . Hypertension    Past Medical History:  Diagnosis Date  . Arthritis    Back, Hip- right  . Diabetes mellitus without complication ([place])    Type II  . Endometriosis   .  Family history of adverse reaction to anesthesia    Father - N/V - blood pressure  . GERD (gastroesophageal reflux disease)   . History of kidney stones   . History of ulcerative colitis   . Hypertension   . Insomnia   . 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 Neuropathy ([place])   . Pneumonia    1983, 2003  . Umbilical hernia   . Vitamin D deficiency     Family History  Problem Relation Age of Onset  . Hypertension Father   . Heart disease Father   . Diabetes Father   . Cancer Father     colon, prostate, and kidney  . Hypertension Mother   . Parkinson's disease Mother   . Heart disease Maternal Grandmother   . Diabetes Maternal Grandmother   . Heart disease Maternal Grandfather   . Diabetes Maternal Grandfather   . Cancer Maternal Grandfather     pt unaware of what kind  . Stroke Paternal Grandmother   . Stroke Paternal Grandfather     Past Surgical History:  Procedure Laterality Date  . BREAST BIOPSY  08-13-2009  DR TSUEI   EXCISION LEFT NIPPLE DUCT  . CHOLECYSTECTOMY  1992  . COLON RESECTION  1986   ULCERATIVE COLITIS  . COLON SURGERY    . ESOPHAGEAL MANOMETRY N/A 06/11/2013   Procedure: ESOPHAGEAL MANOMETRY (  EM);  Surgeon: Jeryl Columbia, MD;  Location: [facility];  Service: Endoscopy;  Laterality: N/A;  . ESOPHAGOGASTRODUODENOSCOPY (EGD) WITH PROPOFOL N/A 05/31/2016   Procedure: ESOPHAGOGASTRODUODENOSCOPY (EGD) WITH PROPOFOL;  Surgeon: Clarene Essex, MD;  Location: [facility] ENDOSCOPY;  Service: Endoscopy;  Laterality: N/A;  . EXCISION RIGHT BREAST MASS   10-20-2005  DR Marylene Buerger  . kidney stone removed  2/11  . KNEE ARTHROSCOPY Left    MCL  . LEFT THUMB CARPOMETACARPAL JOINT SUSPENSIONPLASTY  04-06-2006  DR Burney Gauze  . LEFT WRIST ARTHROSCOPY W/ DEBRIDEMENT AND REMOVAL CYST  03-26-2004  DR Burney Gauze  . MYOMECTOMY    . RIGHT COLECTOMY    . RIGHT URETEROSCOPIC STONE EXTRACTION  12-11-2009  DR Irine Seal  . TENDON RELEASE    . TOTAL HIP ARTHROPLASTY Right 11/26/2016   Procedure:  RIGHT TOTAL HIP ARTHROPLASTY ANTERIOR APPROACH;  Surgeon: Mcarthur Rossetti, MD;  Location: [facility];  Service: Orthopedics;  Laterality: Right;  . WRIST SURGERY     both   Social History   Occupational History  . Not on file.   Social History Main Topics  . Smoking status: Former Smoker    Packs/day: 1.00    Years: 10.00    Types: Cigarettes    Quit date: 09/22/1982  . Smokeless tobacco: Never Used     Comment: quit 1983  . Alcohol use No  . Drug use: No  . Sexual activity: Yes    Partners: Male    Birth control/ protection: Post-menopausal

## 2016-12-18 ENCOUNTER — Other Ambulatory Visit (INDEPENDENT_AMBULATORY_CARE_PROVIDER_SITE_OTHER): Payer: Self-pay | Admitting: Orthopaedic Surgery

## 2016-12-20 NOTE — Telephone Encounter (Signed)
Please advise 

## 2016-12-22 ENCOUNTER — Other Ambulatory Visit: Payer: Self-pay | Admitting: Cardiovascular Disease

## 2016-12-22 NOTE — Telephone Encounter (Signed)
Please review for refill. Thanks!  

## 2017-01-03 DIAGNOSIS — Z79899 Other long term (current) drug therapy: Secondary | ICD-10-CM | POA: Diagnosis not present

## 2017-01-03 DIAGNOSIS — M47816 Spondylosis without myelopathy or radiculopathy, lumbar region: Secondary | ICD-10-CM | POA: Diagnosis not present

## 2017-01-03 DIAGNOSIS — G894 Chronic pain syndrome: Secondary | ICD-10-CM | POA: Diagnosis not present

## 2017-01-03 DIAGNOSIS — M25551 Pain in right hip: Secondary | ICD-10-CM | POA: Diagnosis not present

## 2017-01-06 ENCOUNTER — Ambulatory Visit (INDEPENDENT_AMBULATORY_CARE_PROVIDER_SITE_OTHER): Payer: PPO | Admitting: Orthopaedic Surgery

## 2017-01-06 ENCOUNTER — Ambulatory Visit: Payer: PPO | Admitting: Cardiovascular Disease

## 2017-01-06 DIAGNOSIS — Z96641 Presence of right artificial hip joint: Secondary | ICD-10-CM

## 2017-01-06 NOTE — Progress Notes (Signed)
The patient is now 6 weeks status post a right total hip arthroplasty. She is doing well. She still and visit with a cane but says she is much better than she was preoperative.  On examination incision looks good. There still just a small seroma and I aspirated 20 mL of the hip. Her we should do that again at this point and she agrees. We will try heating pad twice a day.  She'll continue increase her activities as she tolerates. I'll see her back in 4 weeks see how she doing overall. No x-rays are needed at that visit. If she looks good then we do not need to see her back for 6 months to a year.

## 2017-01-16 ENCOUNTER — Other Ambulatory Visit: Payer: Self-pay | Admitting: Cardiovascular Disease

## 2017-01-16 DIAGNOSIS — E785 Hyperlipidemia, unspecified: Secondary | ICD-10-CM

## 2017-01-17 NOTE — Telephone Encounter (Signed)
Will you please review for refill, Thanks! 

## 2017-01-26 DIAGNOSIS — M47816 Spondylosis without myelopathy or radiculopathy, lumbar region: Secondary | ICD-10-CM | POA: Diagnosis not present

## 2017-01-26 DIAGNOSIS — M150 Primary generalized (osteo)arthritis: Secondary | ICD-10-CM | POA: Diagnosis not present

## 2017-01-26 DIAGNOSIS — Z79899 Other long term (current) drug therapy: Secondary | ICD-10-CM | POA: Diagnosis not present

## 2017-01-26 DIAGNOSIS — G894 Chronic pain syndrome: Secondary | ICD-10-CM | POA: Diagnosis not present

## 2017-01-28 ENCOUNTER — Ambulatory Visit (INDEPENDENT_AMBULATORY_CARE_PROVIDER_SITE_OTHER): Payer: PPO | Admitting: Cardiovascular Disease

## 2017-01-28 ENCOUNTER — Encounter: Payer: Self-pay | Admitting: Cardiovascular Disease

## 2017-01-28 VITALS — BP 124/76 | HR 88 | Ht 64.75 in | Wt 225.0 lb

## 2017-01-28 DIAGNOSIS — E785 Hyperlipidemia, unspecified: Secondary | ICD-10-CM | POA: Diagnosis not present

## 2017-01-28 DIAGNOSIS — I10 Essential (primary) hypertension: Secondary | ICD-10-CM

## 2017-01-28 MED ORDER — AMLODIPINE BESYLATE 5 MG PO TABS: 5.0000 mg | ORAL_TABLET | Freq: Every day | ORAL | 3 refills | Status: DC

## 2017-01-28 NOTE — Patient Instructions (Addendum)
Medication Instructions:  Your physician recommends that you continue on your current medications as directed. Please refer to the Current Medication list given to you today.  Labwork: None   Testing/Procedures: None   Follow-Up: Your physician wants you to follow-up in: [place]. You will receive a reminder letter in the mail two months in advance. If you don't receive a letter, please call our office to schedule the follow-up appointment.  Any Other Special Instructions Will Be Listed Below (If Applicable).     If you need a refill on your cardiac medications before your next appointment, please call your pharmacy.

## 2017-01-28 NOTE — Progress Notes (Signed)
Cardiology Office Note   Date:  01/28/2017   ID:  Lauren, [name] 1946-10-20, MRN [id number]  PCP:  Melinda Crutch, MD  Cardiologist:   Skeet Latch, MD   Chief Complaint  Patient presents with  . Follow-up    no complaints      Patient ID: Lauren [name] is a 71 y.o. female with diabetes, hypertension, hyperlipidemia, and family history of CAD who presents for follow up.    She was initially seen 09/2015 for an evaluation of chest pain.  she had been seen in the emergency department  where cardiac enzymes were negative.  D-dimer was elevated, so she underwent CT-A of the chest that was negative for PE. She had a [employer] 11/2016 that revealed LVEF 72% and no ischemia. The stress test was delayed because she was unable to lay flat due to back pain.  At her last appointment her blood pressure was poorly-controlled, so amlodipine was added to her regimen. She underwent right hip replacement 11/2016.Synthroid time she has been doing well. She notes that her gait is off.  Her leg was lengthened slightly when her hip was replaced. She now has pain in the opposite knee and low back. She denies any chest pain or shortness of breath. She also has not noted any lightheadedness, dizziness, or palpitations.  She denies lower extremity edema, orthopnea, or PND. She notes that since adding amlodipine her blood pressure is been well-controlled. It was slightly low when she was in the hospital but has been stable since that time.   Past Medical History:  Diagnosis Date  . Arthritis    Back, Hip- right  . Diabetes mellitus without complication ([place])    Type II  . Endometriosis   . Family history of adverse reaction to anesthesia    Father - N/V - blood pressure  . GERD (gastroesophageal reflux disease)   . History of kidney stones   . History of ulcerative colitis   . Hypertension   . Insomnia   . 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 Neuropathy ([place])   . Pneumonia    1983, 2003    . Umbilical hernia   . Vitamin D deficiency     Past Surgical History:  Procedure Laterality Date  . BREAST BIOPSY  08-13-2009  DR TSUEI   EXCISION LEFT NIPPLE DUCT  . CHOLECYSTECTOMY  1992  . COLON RESECTION  1986   ULCERATIVE COLITIS  . COLON SURGERY    . ESOPHAGEAL MANOMETRY N/A 06/11/2013   Procedure: ESOPHAGEAL MANOMETRY (EM);  Surgeon: Jeryl Columbia, MD;  Location: [facility] ENDOSCOPY;  Service: Endoscopy;  Laterality: N/A;  . ESOPHAGOGASTRODUODENOSCOPY (EGD) WITH PROPOFOL N/A 05/31/2016   Procedure: ESOPHAGOGASTRODUODENOSCOPY (EGD) WITH PROPOFOL;  Surgeon: Clarene Essex, MD;  [facility] END[facility]COPY;  Service: Endoscopy;  Laterality: N/A;  . EXCISION RIGHT BREAST MASS   10-20-2005  DR Marylene Buerger  . kidney stone removed  2/11  . KNEE ARTHROSCOPY Left    MCL  . LEFT THUMB CARPOMETACARPAL JOINT SUSPENSIONPLASTY  04-06-2006  DR Burney Gauze  . LEFT WRIST ARTHROSCOPY W/ DEBRIDEMENT AND REMOVAL CYST  03-26-2004  DR Burney Gauze  . MYOMECTOMY    . RIGHT COLECTOMY    . RIGHT URETEROSCOPIC STONE EXTRACTION  12-11-2009  DR Irine Seal  . TENDON RELEASE    . TOTAL HIP ARTHROPLASTY Right 11/26/2016   Procedure: RIGHT TOTAL HIP ARTHROPLASTY ANTERIOR APPROACH;  Surgeon: Mcarthur Rossetti, MD;  Location: [facility] ORS;  Service: Orthopedics;  Laterality: Right;  . WRIST SURGERY     both     Current Outpatient Prescriptions  Medication Sig Dispense Refill  . amLODipine (NORVASC) 5 MG tablet Take 1 tablet (5 mg total) by mouth daily. 90 tablet 3  . aspirin EC 81 MG tablet Take 1 tablet (81 mg total) by mouth 2 (two) times daily after a meal. 30 tablet 0  . b complex vitamins tablet Take 1 tablet by mouth daily.    . balsalazide (COLAZAL) 750 MG capsule Take 2-3 capsules by mouth See admin instructions. 3 capsules in the morning, and 2-3 capsules at bedtime    . Cholecalciferol (VITAMIN D) 2000 units tablet Take 4,000 Units by mouth daily.    . cholestyramine (QUESTRAN) 4 GM/DOSE powder Take 1 g by mouth  daily. Reported on 11/05/2015    . fluticasone (FLONASE) 50 MCG/ACT nasal spray USE 1-2 SPRAYS IN EACH NOSTRIL DAILY AS NEEDED FOR ALLERGIES  12  . furosemide (LASIX) 20 MG tablet Take 20 mg by mouth daily.     Marland Kitchen gabapentin (NEURONTIN) 300 MG capsule Take 300 mg by mouth at bedtime.  1  . glimepiride (AMARYL) 4 MG tablet Take 4 mg by mouth daily with breakfast.    . HYDROcodone-acetaminophen (NORCO/VICODIN) 5-325 MG tablet Take 1-2 tablets by mouth every 4 (four) hours as needed (pain). Reported on 11/05/2015 60 tablet 0  . lidocaine (ASPERCREME W/LIDOCAINE) 4 % cream Apply 1 application topically daily as needed. Applies to hip-right at bedtime    . magnesium gluconate (MAGONATE) 500 MG tablet Take 500 mg by mouth at bedtime.    . metFORMIN (GLUCOPHAGE-XR) 500 MG 24 hr tablet Take 1,000 mg by mouth 2 (two) times daily.     . methocarbamol (ROBAXIN) 500 MG tablet TAKE 1 TABLET BY MOUTH EVERY 6 HOURS AS NEEDED FOR MUSCLE SPASM 60 tablet 0  . Misc Natural Products (JOINT HEALTH) CAPS Take 1 capsule by mouth daily. ARTHROCEN 300 mg    . pantoprazole (PROTONIX) 40 MG tablet Take 40 mg by mouth 2 (two) times daily.     . quinapril (ACCUPRIL) 40 MG tablet Take 40 mg by mouth daily.    . rosuvastatin (CRESTOR) 20 MG tablet TAKE 1 TABLET (20 MG TOTAL) BY MOUTH DAILY. 90 tablet 0  . traMADol (ULTRAM) 50 MG tablet Take 2 tablets by mouth 2 (two) times daily as needed for moderate pain (pain).   2   No current facility-administered medications for this visit.     Allergies:   Ciprofloxacin hcl; Percocet [oxycodone-acetaminophen]; Penicillins; Sulfa antibiotics; Tape; and Tetanus toxoids    Social History:  The patient  reports that she quit smoking about 34 years ago. Her smoking use included Cigarettes. She has a 10.00 pack-year smoking history. She has never used smokeless tobacco. She reports that she does not drink alcohol or use drugs.   Family History:  The patient's family history includes Cancer in  her father and maternal grandfather; Diabetes in her father, maternal grandfather, and maternal grandmother; Heart disease in her father, maternal grandfather, and maternal grandmother; Hypertension in her father and mother; Parkinson's disease in her mother; Stroke in her paternal grandfather and paternal grandmother.    ROS:  Please see the history of present illness.   Otherwise, review of systems are positive for none.   All other systems are reviewed and negative.    PHYSICAL EXAM: VS:  BP 124/76   Pulse 88   Ht 5' 4.75" (  1.645 m)   Wt 102.1 kg (225 lb)   BMI 37.73 kg/m  , BMI Body mass index is 37.73 kg/m. GENERAL:  Well appearing HEENT:  Pupils equal round and reactive, fundi not visualized, oral mucosa unremarkable NECK:  No jugular venous distention, waveform within normal limits, carotid upstroke brisk and symmetric, no bruits, no thyromegaly LYMPHATICS:  No cervical adenopathy LUNGS:  Clear to auscultation bilaterally HEART:  RRR.  PMI not displaced or sustained,S1 and S2 within normal limits, no S3, no S4, no clicks, no rubs, no murmurs ABD:  Flat, positive bowel sounds normal in frequency in pitch, no bruits, no rebound, no guarding, no midline pulsatile mass, no hepatomegaly, no splenomegaly EXT:  2 plus pulses throughout, no edema, no cyanosis no clubbing SKIN:  No rashes no nodules NEURO:  Cranial nerves II through XII grossly intact, motor grossly intact throughout PSYCH:  Cognitively intact, oriented to person place and time   EKG:  EKG is ordered today. The ekg ordered today demonstrates sinus rhythm rate 81 bpm.  Lexiscan Myoview 11/19/16: Nuclear stress EF: 72%.  The left ventricular ejection fraction is hyperdynamic (>65%).  There was no ST segment deviation noted during stress.  This is a low risk study. No perfusion defects.  Recent Labs: 11/27/2016: BUN 14; Creatinine, Ser 0.87; Potassium 5.1; Sodium 138 11/28/2016: Hemoglobin 10.1; Platelets 271     Lipid Panel No results found for: CHOL, TRIG, HDL, CHOLHDL, VLDL, LDLCALC, LDLDIRECT    Wt Readings from Last 3 Encounters:  01/28/17 102.1 kg (225 lb)  11/26/16 99.8 kg (220 lb)  11/19/16 99.8 kg (220 lb)      ASSESSMENT AND PLAN:  # Chest pain:  Resolved.  Nuclear stress test was negative for ischemia.  # Hypertension:  Blood pressure is much better-controlled. Continue amlodipine and qunapril.  # Hyperlipidemia: Continue rosuvastatin.   she reports that her PCP recently check lipids. We will request a copy.   Current medicines are reviewed at length with the patient today.  The patient does not have concerns regarding medicines.  The following changes have been made:  None  Labs/ tests ordered today include:  No orders of the defined types were placed in this encounter.    Disposition:   FU with Deloris Mittag C. Oval Linsey, MD in 1 year   Signed, Skeet Latch, MD  01/28/2017 1:14 PM    [place]

## 2017-02-08 ENCOUNTER — Ambulatory Visit (INDEPENDENT_AMBULATORY_CARE_PROVIDER_SITE_OTHER): Payer: PPO | Admitting: Orthopaedic Surgery

## 2017-02-08 DIAGNOSIS — G8929 Other chronic pain: Secondary | ICD-10-CM | POA: Diagnosis not present

## 2017-02-08 DIAGNOSIS — Z96641 Presence of right artificial hip joint: Secondary | ICD-10-CM

## 2017-02-08 DIAGNOSIS — M25562 Pain in left knee: Secondary | ICD-10-CM

## 2017-02-08 MED ORDER — METHYLPREDNISOLONE ACETATE 40 MG/ML IJ SUSP
40.0000 mg | INTRAMUSCULAR | Status: AC | PRN
  Administered 2017-02-08: 40 mg via INTRA_ARTICULAR

## 2017-02-08 NOTE — Progress Notes (Signed)
Office Visit Note   Patient: Lauren [name]           Date of Birth: May 04, 1946           MRN: [id number] Visit Date: 02/08/2017              Requested by: Lawerance Cruel, MD [place], [place] [address] PCP: Melinda Crutch, MD   Assessment & Plan: Visit Diagnoses:  1. Status post total replacement of right hip   2. Chronic pain of left knee     Plan: We talked about trying a steroid injection followed by eventually hyaluronic acid for her left knee and she likes this conservative plan. She tolerated the steroid injection well and her left knee. We'll see her back in a month for hopefully a hyaluronic acid injection replaced in the left knee.  Follow-Up Instructions: Return in about 4 weeks (around 03/08/2017).   Orders:  Orders Placed This Encounter  Procedures  . Large Joint Injection/Arthrocentesis   No orders of the defined types were placed in this encounter.     Procedures: Large Joint Inj Date/Time: 02/08/2017 10:33 AM Performed by: Mcarthur Rossetti Authorized by: Mcarthur Rossetti   Location:  Knee Site:  L knee Ultrasound Guidance: No   Fluoroscopic Guidance: No   Arthrogram: No   Medications:  40 mg methylPREDNISolone acetate 40 MG/ML     Clinical Data: No additional findings.   Subjective: No chief complaint on file. The patient is 2-1/2 months status post a right total hip arthroplasty. She says she has her life back in that right hip is done well. She has a remote history of a left knee arthroscopy about 2 years ago with a partial medial meniscectomy. This was done by another physician. She's been feeling slow down by that left knee and having pain in the left knee and likely consider steroid injection in that knee today. She denies any locking catching but says it does grind in her knee.  HPI  Review of Systems She denies any fever, chills, nausea, vomiting  Objective: Vital Signs: There were no vitals taken for  this visit.  Physical Exam He is alert and oriented 3 in no acute distress Ortho Exam Examination of her right operative hip shows fluid range of motion of that hip minimal pain over the incision trochanteric area. Examination of her left knee shows no effusion but some medial joint line tenderness and patellofemoral crepitation. Range of motion is full. The knee feels ligamentously stable. Specialty Comments:  No specialty comments available.  Imaging: No results found.   PMFS History: Patient Active Problem List   Diagnosis Date Noted  . Unilateral primary osteoarthritis, right hip 11/26/2016  . Status post total replacement of right hip 11/26/2016  . Hiatal hernia 04/13/2013  . Type II or unspecified type diabetes mellitus without mention of complication, uncontrolled 01/26/2013  . Umbilical hernia 62/94/7654  . Hypertension    Past Medical History:  Diagnosis Date  . Arthritis    Back, Hip- right  . Diabetes mellitus without complication ([place])    Type II  . Endometriosis   . Family history of adverse reaction to anesthesia    Father - N/V - blood pressure  . GERD (gastroesophageal reflux disease)   . History of kidney stones   . History of ulcerative colitis   . Hypertension   . Insomnia   . 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 Neuropathy ([place])   .  Pneumonia    1983, 2003  . Umbilical hernia   . Vitamin D deficiency     Family History  Problem Relation Age of Onset  . Hypertension Father   . Heart disease Father   . Diabetes Father   . Cancer Father     colon, prostate, and kidney  . Hypertension Mother   . Parkinson's disease Mother   . Heart disease Maternal Grandmother   . Diabetes Maternal Grandmother   . Heart disease Maternal Grandfather   . Diabetes Maternal Grandfather   . Cancer Maternal Grandfather     pt unaware of what kind  . Stroke Paternal Grandmother   . Stroke Paternal Grandfather     Past Surgical History:  Procedure Laterality Date    . BREAST BIOPSY  08-13-2009  DR TSUEI   EXCISION LEFT NIPPLE DUCT  . CHOLECYSTECTOMY  1992  . COLON RESECTION  1986   ULCERATIVE COLITIS  . COLON SURGERY    . ESOPHAGEAL MANOMETRY N/A 06/11/2013   Procedure: ESOPHAGEAL MANOMETRY (EM);  Surgeon: Jeryl Columbia, MD;  Location: [facility] ENDOSCOPY;  Service: Endoscopy;  Laterality: N/A;  . ESOPHAGOGASTRODUODENOSCOPY (EGD) WITH PROPOFOL N/A 05/31/2016   Procedure: ESOPHAGOGASTRODUODENOSCOPY (EGD) WITH PROPOFOL;  Surgeon: Clarene Essex, MD;  Location: [facility] ENDOSCOPY;  Service: Endoscopy;  Laterality: N/A;  . EXCISION RIGHT BREAST MASS   10-20-2005  DR Marylene Buerger  . kidney stone removed  2/11  . KNEE ARTHROSCOPY Left    MCL  . LEFT THUMB CARPOMETACARPAL JOINT SUSPENSIONPLASTY  04-06-2006  DR Burney Gauze  . LEFT WRIST ARTHROSCOPY W/ DEBRIDEMENT AND REMOVAL CYST  03-26-2004  DR Burney Gauze  . MYOMECTOMY    . RIGHT COLECTOMY    . RIGHT URETEROSCOPIC STONE EXTRACTION  12-11-2009  DR Irine Seal  . TENDON RELEASE    . TOTAL HIP ARTHROPLASTY Right 11/26/2016   Procedure: RIGHT TOTAL HIP ARTHROPLASTY ANTERIOR APPROACH;  Surgeon: Mcarthur Rossetti, MD;  Location: [facility];  Service: Orthopedics;  Laterality: Right;  . WRIST SURGERY     both   Social History   Occupational History  . Not on file.   Social History Main Topics  . Smoking status: Former Smoker    Packs/day: 1.00    Years: 10.00    Types: Cigarettes    Quit date: 09/22/1982  . Smokeless tobacco: Never Used     Comment: quit 1983  . Alcohol use No  . Drug use: No  . Sexual activity: Yes    Partners: Male    Birth control/ protection: Post-menopausal

## 2017-02-25 DIAGNOSIS — R938 Abnormal findings on diagnostic imaging of other specified body structures: Secondary | ICD-10-CM | POA: Diagnosis not present

## 2017-02-25 DIAGNOSIS — E1165 Type 2 diabetes mellitus with hyperglycemia: Secondary | ICD-10-CM | POA: Diagnosis not present

## 2017-02-25 DIAGNOSIS — Z7984 Long term (current) use of oral hypoglycemic drugs: Secondary | ICD-10-CM | POA: Diagnosis not present

## 2017-03-09 ENCOUNTER — Ambulatory Visit (INDEPENDENT_AMBULATORY_CARE_PROVIDER_SITE_OTHER): Payer: PPO | Admitting: Physician Assistant

## 2017-03-09 ENCOUNTER — Encounter (INDEPENDENT_AMBULATORY_CARE_PROVIDER_SITE_OTHER): Payer: Self-pay | Admitting: Orthopaedic Surgery

## 2017-03-09 VITALS — Ht 64.75 in | Wt 225.0 lb

## 2017-03-09 DIAGNOSIS — M1712 Unilateral primary osteoarthritis, left knee: Secondary | ICD-10-CM | POA: Insufficient documentation

## 2017-03-09 MED ORDER — HYALURONAN 88 MG/4ML IX SOSY
88.0000 mg | PREFILLED_SYRINGE | INTRA_ARTICULAR | Status: AC | PRN
  Administered 2017-03-09: 88 mg via INTRA_ARTICULAR

## 2017-03-09 NOTE — Progress Notes (Signed)
   Procedure Note  Patient: Lauren [name]             Date of Birth: [birth year]           MRN: [id number]             Visit Date: 03/09/2017  History of present illness Lauren [name] comes in today for her left knee osteoarthritis. She's here for a Monocryl this injection. States she's having some days are better than others. She still has a marked amount of crepitus in the knee.  Physical exam :Left knee no effusion abnormal warmth erythema. Positive crepitus past range of motion of the knee. Procedures: Visit Diagnoses: Primary osteoarthritis of left knee  Large Joint Inj Date/Time: 03/09/2017 11:04 AM Performed by: Pete Pelt Authorized by: Pete Pelt   Consent Given by:  Patient Indications:  Pain Location:  Knee Site:  L knee Needle Size:  22 G Needle Length:  3.5 inches Approach:  Anterolateral Ultrasound Guidance: No   Fluoroscopic Guidance: No   Medications:  88 mg Hyaluronan 88 MG/4ML Aspiration Attempted: No   Patient tolerance:  Patient tolerated the procedure well with no immediate complications   Plan: Follow with [place] in 8 weeks' check her progress lack of. Questions encouraged and answered length today.

## 2017-03-16 ENCOUNTER — Other Ambulatory Visit: Payer: Self-pay | Admitting: Obstetrics and Gynecology

## 2017-03-16 ENCOUNTER — Other Ambulatory Visit: Payer: Self-pay | Admitting: Family Medicine

## 2017-03-16 DIAGNOSIS — R9389 Abnormal findings on diagnostic imaging of other specified body structures: Secondary | ICD-10-CM

## 2017-03-23 DIAGNOSIS — M47816 Spondylosis without myelopathy or radiculopathy, lumbar region: Secondary | ICD-10-CM | POA: Diagnosis not present

## 2017-03-23 DIAGNOSIS — Z79899 Other long term (current) drug therapy: Secondary | ICD-10-CM | POA: Diagnosis not present

## 2017-03-23 DIAGNOSIS — M150 Primary generalized (osteo)arthritis: Secondary | ICD-10-CM | POA: Diagnosis not present

## 2017-03-23 DIAGNOSIS — G894 Chronic pain syndrome: Secondary | ICD-10-CM | POA: Diagnosis not present

## 2017-04-04 NOTE — Addendum Note (Signed)
Addendum  created 04/04/17 1027 by Myrtie Soman, MD   Sign clinical note

## 2017-04-04 NOTE — Anesthesia Postprocedure Evaluation (Signed)
Anesthesia Post Note  Patient: Lauren [name]  Procedure(s) Performed: Procedure(s) (LRB): RIGHT TOTAL HIP ARTHROPLASTY ANTERIOR APPROACH (Right)     Anesthesia Post Evaluation  Last Vitals:  Vitals:   11/27/16 2217 11/28/16 0607  BP: (!) 113/52 (!) 118/55  Pulse: 76 76  Resp: 18 18  Temp: 36.7 C 36.7 C    Last Pain:  Vitals:   11/28/16 0730  TempSrc:   PainSc: 2                  Miara Emminger S

## 2017-04-05 ENCOUNTER — Inpatient Hospital Stay
Admission: RE | Admit: 2017-04-05 | Discharge: 2017-04-05 | Disposition: A | Discharge status: Home / Self Care | Payer: PPO | Source: Ambulatory Visit | Attending: Family Medicine | Admitting: Family Medicine

## 2017-04-18 ENCOUNTER — Other Ambulatory Visit: Payer: Self-pay | Admitting: Cardiovascular Disease

## 2017-04-18 DIAGNOSIS — E785 Hyperlipidemia, unspecified: Secondary | ICD-10-CM

## 2017-04-18 NOTE — Telephone Encounter (Signed)
Please review for refill. Thanks!  

## 2017-05-06 ENCOUNTER — Other Ambulatory Visit: Payer: PPO

## 2017-05-10 ENCOUNTER — Ambulatory Visit (INDEPENDENT_AMBULATORY_CARE_PROVIDER_SITE_OTHER): Payer: PPO | Admitting: Orthopaedic Surgery

## 2017-05-10 ENCOUNTER — Ambulatory Visit (INDEPENDENT_AMBULATORY_CARE_PROVIDER_SITE_OTHER): Payer: PPO

## 2017-05-10 DIAGNOSIS — Z96641 Presence of right artificial hip joint: Secondary | ICD-10-CM | POA: Diagnosis not present

## 2017-05-10 DIAGNOSIS — M25551 Pain in right hip: Secondary | ICD-10-CM

## 2017-05-10 DIAGNOSIS — M7061 Trochanteric bursitis, right hip: Secondary | ICD-10-CM | POA: Diagnosis not present

## 2017-05-10 MED ORDER — LIDOCAINE HCL 1 % IJ SOLN
3.0000 mL | INTRAMUSCULAR | Status: AC | PRN
  Administered 2017-05-10: 3 mL

## 2017-05-10 MED ORDER — METHYLPREDNISOLONE ACETATE 40 MG/ML IJ SUSP
40.0000 mg | INTRAMUSCULAR | Status: AC | PRN
  Administered 2017-05-10: 40 mg via INTRA_ARTICULAR

## 2017-05-10 NOTE — Progress Notes (Signed)
Office Visit Note   Patient: Lauren [name]           Date of Birth: [birth year]           MRN: [id number] Visit Date: 05/10/2017              Requested by: Lawerance Cruel, [place], [place] [address] PCP: Lawerance Cruel, MD   Assessment & Plan: Visit Diagnoses:  1. Pain in right hip   2. Trochanteric bursitis, right hip   3. Status post total replacement of right hip     Plan: I showed her stretching exercises for trochanteric bursitis and talked about steroid injection this area and she is agreeable to this. She'll try to not sleep on her side. I'll see her back in 4 weeks as she doing overall but that visit I would like an AP and lateral of her left knee because of been seeing her for knee problems for a while she's a remote history of a knee arthroscopy. We provided a steroid injection in that left knee just 2 months ago and started still bother her on the medial joint line.  Follow-Up Instructions: Return in about 4 weeks (around 06/07/2017).   Orders:  Orders Placed This Encounter  Procedures  . Large Joint Injection/Arthrocentesis  . XR HIP UNILAT W OR W/O PELVIS 1V RIGHT   No orders of the defined types were placed in this encounter.     Procedures: Large Joint Inj Date/Time: 05/10/2017 10:48 AM Performed by: Mcarthur Rossetti Authorized by: Mcarthur Rossetti   Location:  Hip Site:  R greater trochanter Ultrasound Guidance: No   Fluoroscopic Guidance: No   Arthrogram: No   Medications:  3 mL lidocaine 1 %; 40 mg methylPREDNISolone acetate 40 MG/ML     Clinical Data: No additional findings.   Subjective: No chief complaint on file. The patient is now 6 months status post a right total hip arthroplasty. She's been having some acute right hip pain of the trochanteric area after working in the yard. She is concerned about this and came in today for further rotation treatment. She denies any groin pain. It's on the  side of her hip where she hurts. She denies any locking catching in that hip as well.  HPI  Review of Systems She currently denies any fever, chills, nausea, vomiting, headache, chest pain, sore breath  Objective: Vital Signs: There were no vitals taken for this visit.  Physical Exam She is alert or 3 and in no acute distress Ortho Exam Examination of her right hip shows fluid range of motion with no problems in the groin at all. She does have pain of the trochanteric area that radiates down the IT band. There is no radicular component terms of sciatica. Her knee foot and ankle exam are normal. Specialty Comments:  No specialty comments available.  Imaging: Xr Hip Unilat W Or W/o Pelvis 1v Right  Result Date: 05/10/2017 An AP pelvis and a lateral of her right hip show no acute findings. The hip replacement is in good position with no malalignment. There is no evidence of ostial lysis or loosening.    PMFS History: Patient Active Problem List   Diagnosis Date Noted  . Trochanteric bursitis, right hip 05/10/2017  . Primary osteoarthritis of left knee 03/09/2017  . Unilateral primary osteoarthritis, right hip 11/26/2016  . Status post total replacement of right hip 11/26/2016  . Hiatal hernia 04/13/2013  . Type II  or unspecified type diabetes mellitus without mention of complication, uncontrolled 01/26/2013  . Umbilical hernia 97/35/3299  . Hypertension    Past Medical History:  Diagnosis Date  . Arthritis    Back, Hip- right  . Diabetes mellitus without complication ([place])    Type II  . Endometriosis   . Family history of adverse reaction to anesthesia    Father - N/V - blood pressure  . GERD (gastroesophageal reflux disease)   . History of kidney stones   . History of ulcerative colitis   . Hypertension   . Insomnia   . 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 Neuropathy   . Pneumonia    1983, 2003  . Umbilical hernia   . Vitamin D deficiency     Family History    Problem Relation Age of Onset  . Hypertension Father   . Heart disease Father   . Diabetes Father   . Cancer Father        colon, prostate, and kidney  . Hypertension Mother   . Parkinson's disease Mother   . Heart disease Maternal Grandmother   . Diabetes Maternal Grandmother   . Heart disease Maternal Grandfather   . Diabetes Maternal Grandfather   . Cancer Maternal Grandfather        pt unaware of what kind  . Stroke Paternal Grandmother   . Stroke Paternal Grandfather     Past Surgical History:  Procedure Laterality Date  . BREAST BIOPSY  08-13-2009  DR TSUEI   EXCISION LEFT NIPPLE DUCT  . CHOLECYSTECTOMY  1992  . COLON RESECTION  1986   ULCERATIVE COLITIS  . COLON SURGERY    . ESOPHAGEAL MANOMETRY N/A 06/11/2013   Procedure: ESOPHAGEAL MANOMETRY (EM);  Surgeon: Jeryl Columbia, MD;  Location: [facility] ENDOSCOPY;  Service: Endoscopy;  Laterality: N/A;  . ESOPHAGOGASTRODUODENOSCOPY (EGD) WITH PROPOFOL N/A 05/31/2016   Procedure: ESOPHAGOGASTRODUODENOSCOPY (EGD) WITH PROPOFOL;  Surgeon: Clarene Essex, MD;  Location: [facility] ENDOSCOPY;  Service: Endoscopy;  Laterality: N/A;  . EXCISION RIGHT BREAST MASS   10-20-2005  DR Marylene Buerger  . kidney stone removed  2/11  . KNEE ARTHROSCOPY Left    MCL  . LEFT THUMB CARPOMETACARPAL JOINT SUSPENSIONPLASTY  04-06-2006  DR Burney Gauze  . LEFT WRIST ARTHROSCOPY W/ DEBRIDEMENT AND REMOVAL CYST  03-26-2004  DR Burney Gauze  . MYOMECTOMY    . RIGHT COLECTOMY    . RIGHT URETEROSCOPIC STONE EXTRACTION  12-11-2009  DR Irine Seal  . TENDON RELEASE    . TOTAL HIP ARTHROPLASTY Right 11/26/2016   Procedure: RIGHT TOTAL HIP ARTHROPLASTY ANTERIOR APPROACH;  Surgeon: Mcarthur Rossetti, MD;  Location: [facility];  Service: Orthopedics;  Laterality: Right;  . WRIST SURGERY     both   Social History   Occupational History  . Not on file.   Social History Main Topics  . Smoking status: Former Smoker    Packs/day: 1.00    Years: 10.00    Types: Cigarettes    Quit  date: 09/22/1982  . Smokeless tobacco: Never Used     Comment: quit 1983  . Alcohol use No  . Drug use: No  . Sexual activity: Yes    Partners: Male    Birth control/ protection: Post-menopausal

## 2017-05-18 DIAGNOSIS — M150 Primary generalized (osteo)arthritis: Secondary | ICD-10-CM | POA: Diagnosis not present

## 2017-05-18 DIAGNOSIS — Z79899 Other long term (current) drug therapy: Secondary | ICD-10-CM | POA: Diagnosis not present

## 2017-05-18 DIAGNOSIS — M47816 Spondylosis without myelopathy or radiculopathy, lumbar region: Secondary | ICD-10-CM | POA: Diagnosis not present

## 2017-05-18 DIAGNOSIS — G894 Chronic pain syndrome: Secondary | ICD-10-CM | POA: Diagnosis not present

## 2017-05-20 DIAGNOSIS — R0981 Nasal congestion: Secondary | ICD-10-CM | POA: Diagnosis not present

## 2017-05-20 DIAGNOSIS — J309 Allergic rhinitis, unspecified: Secondary | ICD-10-CM | POA: Diagnosis not present

## 2017-05-20 DIAGNOSIS — R2689 Other abnormalities of gait and mobility: Secondary | ICD-10-CM | POA: Diagnosis not present

## 2017-05-20 DIAGNOSIS — R5383 Other fatigue: Secondary | ICD-10-CM | POA: Diagnosis not present

## 2017-05-20 DIAGNOSIS — R531 Weakness: Secondary | ICD-10-CM | POA: Diagnosis not present

## 2017-05-26 ENCOUNTER — Ambulatory Visit
Admission: RE | Admit: 2017-05-26 | Discharge: 2017-05-26 | Disposition: A | Discharge status: Home / Self Care | Payer: PPO | Source: Ambulatory Visit | Attending: Family Medicine | Admitting: Family Medicine

## 2017-05-26 DIAGNOSIS — R9389 Abnormal findings on diagnostic imaging of other specified body structures: Secondary | ICD-10-CM

## 2017-05-26 DIAGNOSIS — R911 Solitary pulmonary nodule: Secondary | ICD-10-CM | POA: Diagnosis not present

## 2017-05-26 MED ORDER — IOPAMIDOL (ISOVUE-300) INJECTION 61%
75.0000 mL | Freq: Once | INTRAVENOUS | Status: AC | PRN
  Administered 2017-05-26: 75 mL via INTRAVENOUS

## 2017-06-08 DIAGNOSIS — M25571 Pain in right ankle and joints of right foot: Secondary | ICD-10-CM | POA: Diagnosis not present

## 2017-06-13 ENCOUNTER — Ambulatory Visit (INDEPENDENT_AMBULATORY_CARE_PROVIDER_SITE_OTHER): Payer: PPO

## 2017-06-13 ENCOUNTER — Encounter (INDEPENDENT_AMBULATORY_CARE_PROVIDER_SITE_OTHER): Payer: Self-pay | Admitting: Orthopaedic Surgery

## 2017-06-13 ENCOUNTER — Ambulatory Visit (INDEPENDENT_AMBULATORY_CARE_PROVIDER_SITE_OTHER): Payer: PPO | Admitting: Orthopaedic Surgery

## 2017-06-13 DIAGNOSIS — M25571 Pain in right ankle and joints of right foot: Secondary | ICD-10-CM

## 2017-06-13 DIAGNOSIS — M1712 Unilateral primary osteoarthritis, left knee: Secondary | ICD-10-CM

## 2017-06-13 DIAGNOSIS — M1611 Unilateral primary osteoarthritis, right hip: Secondary | ICD-10-CM | POA: Diagnosis not present

## 2017-06-13 NOTE — Progress Notes (Signed)
Office Visit Note   Patient: Lauren [name]           Date of Birth: 08/18/1946           MRN: [id number] Visit Date: 06/13/2017              Requested by: Lawerance Cruel, [place], [place] [address] PCP: Lawerance Cruel, MD   Assessment & Plan: Visit Diagnoses:  1. Primary osteoarthritis of left knee   2. Unilateral primary osteoarthritis, right hip   3. Pain in right ankle and joints of right foot     Plan: We talked about the potential knee replacement surgery in the future. She will see her ankle does first. I showed her knee model in a knee replacement model as well as well. I'll put her in a ASO for her right ankle and over wear regular tennis shoes. I like to reevaluate her ankle in 2 weeks and that point go over knee replacement surgery with her again.  Follow-Up Instructions: Return in about 2 weeks (around 06/27/2017).   Orders:  Orders Placed This Encounter  Procedures  . XR KNEE 3 VIEW LEFT  . XR Ankle Complete Right   No orders of the defined types were placed in this encounter.     Procedures: No procedures performed   Clinical Data: No additional findings.   Subjective: Chief Complaint  Patient presents with  . Right Hip - Follow-up  . Left Knee - Pain, Follow-up  . Right Foot - Pain  The patient is well-known to me. She is status post a right total hip are the plastic. She's had acute pain in her right ankle last week or 2. This is been slowly getting worse. She also has known severe osteoarthritis in her left knee. She's tried and failed steroid injections. She's had arthroscopic intervention remotely of that left knee with significant medial meniscectomy. Her pain is daily and is detrimentally affecting her activities daily living, her quality of life, and her mobility.  HPI  Review of Systems She currently denies any headache, chest pain, short of breath, fever, chills, nausea, vomiting.  Objective: Vital Signs:  There were no vitals taken for this visit.  Physical Exam She is alert and with 3 in no acute distress. She is walking with a walker. Ortho Exam She has full active and passive motion of her right ankle with minimal swelling and her pain seems to be in the anterior ankle and ankle joint itself. Her Achilles is intact. She is neurovascularly intact. Examination of her left knee shows medial joint line tenderness and significant patellofemoral crepitation. She has varus malalignment that is correctable and her knee range of motion is good with ligaments stable knee. She has well-healed previous arthroscopy portal sites. Specialty Comments:  No specialty comments available.  Imaging: Xr Ankle Complete Right  Result Date: 06/13/2017 3 views of the right ankle show no fracture, dislocation or acute findings.  Xr Knee 3 View Left  Result Date: 06/13/2017 3 views of the left knee show severe tricompartmental arthritic changes with varus malalignment, medial joint space narrowing, sclerotic changes and periarticular osteophytes.    PMFS History: Patient Active Problem List   Diagnosis Date Noted  . Trochanteric bursitis, right hip 05/10/2017  . Primary osteoarthritis of left knee 03/09/2017  . Unilateral primary osteoarthritis, right hip 11/26/2016  . Status post total replacement of right hip 11/26/2016  . Hiatal hernia 04/13/2013  . Type II or  unspecified type diabetes mellitus without mention of complication, uncontrolled 01/26/2013  . Umbilical hernia 15/17/6160  . Hypertension    Past Medical History:  Diagnosis Date  . Arthritis    Back, Hip- right  . Diabetes mellitus without complication ([place])    Type II  . Endometriosis   . Family history of adverse reaction to anesthesia    Father - N/V - blood pressure  . GERD (gastroesophageal reflux disease)   . History of kidney stones   . History of ulcerative colitis   . Hypertension   . Insomnia   . Lumbar disc disease    L4  L5  . Neuropathy   . Pneumonia    1983, 2003  . Umbilical hernia   . Vitamin D deficiency     Family History  Problem Relation Age of Onset  . Hypertension Father   . Heart disease Father   . Diabetes Father   . Cancer Father        colon, prostate, and kidney  . Hypertension Mother   . Parkinson's disease Mother   . Heart disease Maternal Grandmother   . Diabetes Maternal Grandmother   . Heart disease Maternal Grandfather   . Diabetes Maternal Grandfather   . Cancer Maternal Grandfather        pt unaware of what kind  . Stroke Paternal Grandmother   . Stroke Paternal Grandfather     Past Surgical History:  Procedure Laterality Date  . BREAST BIOPSY  08-13-2009  DR TSUEI   EXCISION LEFT NIPPLE DUCT  . CHOLECYSTECTOMY  1992  . COLON RESECTION  1986   ULCERATIVE COLITIS  . COLON SURGERY    . ESOPHAGEAL MANOMETRY N/A 06/11/2013   Procedure: ESOPHAGEAL MANOMETRY (EM);  Surgeon: Jeryl Columbia, MD;  Location: [facility];  Service: Endoscopy;  Laterality: N/A;  . ESOPHAGOGASTRODUODENOSCOPY (EGD) WITH PROPOFOL N/A 05/31/2016   Procedure: ESOPHAGOGASTRODUODENOSCOPY (EGD) WITH PROPOFOL;  Surgeon: Clarene Essex, MD;  Location: [facility] ENDOSCOPY;  Service: Endoscopy;  Laterality: N/A;  . EXCISION RIGHT BREAST MASS   10-20-2005  DR Marylene Buerger  . kidney stone removed  2/11  . KNEE ARTHROSCOPY Left    MCL  . LEFT THUMB CARPOMETACARPAL JOINT SUSPENSIONPLASTY  04-06-2006  DR Burney Gauze  . LEFT WRIST ARTHROSCOPY W/ DEBRIDEMENT AND REMOVAL CYST  03-26-2004  DR Burney Gauze  . MYOMECTOMY    . RIGHT COLECTOMY    . RIGHT URETEROSCOPIC STONE EXTRACTION  12-11-2009  DR Irine Seal  . TENDON RELEASE    . TOTAL HIP ARTHROPLASTY Right 11/26/2016   Procedure: RIGHT TOTAL HIP ARTHROPLASTY ANTERIOR APPROACH;  Surgeon: Mcarthur Rossetti, MD;  Location: [facility];  Service: Orthopedics;  Laterality: Right;  . WRIST SURGERY     both   Social History   Occupational History  . Not on file.   Social History  Main Topics  . Smoking status: Former Smoker    Packs/day: 1.00    Years: 10.00    Types: Cigarettes    Quit date: 09/22/1982  . Smokeless tobacco: Never Used     Comment: quit 1983  . Alcohol use No  . Drug use: No  . Sexual activity: Yes    Partners: Male    Birth control/ protection: Post-menopausal

## 2017-06-22 ENCOUNTER — Ambulatory Visit: Payer: PPO

## 2017-06-27 ENCOUNTER — Ambulatory Visit (INDEPENDENT_AMBULATORY_CARE_PROVIDER_SITE_OTHER): Payer: PPO | Admitting: Orthopaedic Surgery

## 2017-06-27 ENCOUNTER — Encounter (INDEPENDENT_AMBULATORY_CARE_PROVIDER_SITE_OTHER): Payer: Self-pay | Admitting: Orthopaedic Surgery

## 2017-06-27 DIAGNOSIS — M7751 Other enthesopathy of right foot: Secondary | ICD-10-CM | POA: Diagnosis not present

## 2017-06-27 DIAGNOSIS — M25871 Other specified joint disorders, right ankle and foot: Secondary | ICD-10-CM | POA: Diagnosis not present

## 2017-06-27 DIAGNOSIS — M25671 Stiffness of right ankle, not elsewhere classified: Secondary | ICD-10-CM | POA: Diagnosis not present

## 2017-06-27 DIAGNOSIS — M799 Soft tissue disorder, unspecified: Secondary | ICD-10-CM | POA: Diagnosis not present

## 2017-06-27 DIAGNOSIS — M25571 Pain in right ankle and joints of right foot: Secondary | ICD-10-CM | POA: Diagnosis not present

## 2017-06-27 DIAGNOSIS — M25562 Pain in left knee: Secondary | ICD-10-CM | POA: Diagnosis not present

## 2017-06-27 DIAGNOSIS — G8929 Other chronic pain: Secondary | ICD-10-CM | POA: Diagnosis not present

## 2017-06-27 DIAGNOSIS — R531 Weakness: Secondary | ICD-10-CM | POA: Diagnosis not present

## 2017-06-27 DIAGNOSIS — M6250 Muscle wasting and atrophy, not elsewhere classified, unspecified site: Secondary | ICD-10-CM | POA: Diagnosis not present

## 2017-06-27 DIAGNOSIS — I10 Essential (primary) hypertension: Secondary | ICD-10-CM | POA: Diagnosis not present

## 2017-06-27 DIAGNOSIS — M1712 Unilateral primary osteoarthritis, left knee: Secondary | ICD-10-CM | POA: Diagnosis not present

## 2017-06-27 DIAGNOSIS — E669 Obesity, unspecified: Secondary | ICD-10-CM | POA: Diagnosis not present

## 2017-06-27 DIAGNOSIS — E118 Type 2 diabetes mellitus with unspecified complications: Secondary | ICD-10-CM | POA: Diagnosis not present

## 2017-06-27 DIAGNOSIS — R262 Difficulty in walking, not elsewhere classified: Secondary | ICD-10-CM | POA: Diagnosis not present

## 2017-06-27 DIAGNOSIS — Z736 Limitation of activities due to disability: Secondary | ICD-10-CM | POA: Diagnosis not present

## 2017-06-27 NOTE — Progress Notes (Signed)
Mrs. Gossen is still having a considerable amount of left knee pain in right ankle pain. We filled the right ankle is morbid tendinitis from taking pressure off of her left knee. She does wish to consider left knee replacement surgery at this point having failed all forms conservative treatment. However her right ankle is causing significant limitations.  On exam his right ankle she's got good flexion-extension ankle but her pain is on the posterior tibial tendon area but there is no dysfunction of the tendon thus far. Her left knee still has painful range of motion with mild effusion is significant joint line tenderness.  At this point I would like to send her to physical therapy to work on her ankle as well as bilateral quad strengthening. Encounter modalities to help calm down her pain and improve her range of motion and function and mobility would be great and warranted and I gave her prescription for this. We'll also try some topical anti-inflammatories. I'll see her back in 4 weeks to see if she is making enough progress and then schedule knee replacement surgery. All questions were encouraged and answered.

## 2017-06-30 DIAGNOSIS — R262 Difficulty in walking, not elsewhere classified: Secondary | ICD-10-CM | POA: Diagnosis not present

## 2017-06-30 DIAGNOSIS — E118 Type 2 diabetes mellitus with unspecified complications: Secondary | ICD-10-CM | POA: Diagnosis not present

## 2017-06-30 DIAGNOSIS — Z736 Limitation of activities due to disability: Secondary | ICD-10-CM | POA: Diagnosis not present

## 2017-06-30 DIAGNOSIS — M25871 Other specified joint disorders, right ankle and foot: Secondary | ICD-10-CM | POA: Diagnosis not present

## 2017-06-30 DIAGNOSIS — M799 Soft tissue disorder, unspecified: Secondary | ICD-10-CM | POA: Diagnosis not present

## 2017-06-30 DIAGNOSIS — R531 Weakness: Secondary | ICD-10-CM | POA: Diagnosis not present

## 2017-06-30 DIAGNOSIS — M25671 Stiffness of right ankle, not elsewhere classified: Secondary | ICD-10-CM | POA: Diagnosis not present

## 2017-06-30 DIAGNOSIS — I10 Essential (primary) hypertension: Secondary | ICD-10-CM | POA: Diagnosis not present

## 2017-06-30 DIAGNOSIS — M25571 Pain in right ankle and joints of right foot: Secondary | ICD-10-CM | POA: Diagnosis not present

## 2017-06-30 DIAGNOSIS — E669 Obesity, unspecified: Secondary | ICD-10-CM | POA: Diagnosis not present

## 2017-06-30 DIAGNOSIS — M6250 Muscle wasting and atrophy, not elsewhere classified, unspecified site: Secondary | ICD-10-CM | POA: Diagnosis not present

## 2017-07-05 DIAGNOSIS — R531 Weakness: Secondary | ICD-10-CM | POA: Diagnosis not present

## 2017-07-05 DIAGNOSIS — E669 Obesity, unspecified: Secondary | ICD-10-CM | POA: Diagnosis not present

## 2017-07-05 DIAGNOSIS — M6250 Muscle wasting and atrophy, not elsewhere classified, unspecified site: Secondary | ICD-10-CM | POA: Diagnosis not present

## 2017-07-05 DIAGNOSIS — Z736 Limitation of activities due to disability: Secondary | ICD-10-CM | POA: Diagnosis not present

## 2017-07-05 DIAGNOSIS — M25571 Pain in right ankle and joints of right foot: Secondary | ICD-10-CM | POA: Diagnosis not present

## 2017-07-05 DIAGNOSIS — E118 Type 2 diabetes mellitus with unspecified complications: Secondary | ICD-10-CM | POA: Diagnosis not present

## 2017-07-05 DIAGNOSIS — M25871 Other specified joint disorders, right ankle and foot: Secondary | ICD-10-CM | POA: Diagnosis not present

## 2017-07-05 DIAGNOSIS — M799 Soft tissue disorder, unspecified: Secondary | ICD-10-CM | POA: Diagnosis not present

## 2017-07-05 DIAGNOSIS — R262 Difficulty in walking, not elsewhere classified: Secondary | ICD-10-CM | POA: Diagnosis not present

## 2017-07-05 DIAGNOSIS — M25671 Stiffness of right ankle, not elsewhere classified: Secondary | ICD-10-CM | POA: Diagnosis not present

## 2017-07-05 DIAGNOSIS — I10 Essential (primary) hypertension: Secondary | ICD-10-CM | POA: Diagnosis not present

## 2017-07-06 DIAGNOSIS — M25671 Stiffness of right ankle, not elsewhere classified: Secondary | ICD-10-CM | POA: Diagnosis not present

## 2017-07-06 DIAGNOSIS — M25871 Other specified joint disorders, right ankle and foot: Secondary | ICD-10-CM | POA: Diagnosis not present

## 2017-07-06 DIAGNOSIS — M6250 Muscle wasting and atrophy, not elsewhere classified, unspecified site: Secondary | ICD-10-CM | POA: Diagnosis not present

## 2017-07-06 DIAGNOSIS — I10 Essential (primary) hypertension: Secondary | ICD-10-CM | POA: Diagnosis not present

## 2017-07-06 DIAGNOSIS — Z736 Limitation of activities due to disability: Secondary | ICD-10-CM | POA: Diagnosis not present

## 2017-07-06 DIAGNOSIS — M799 Soft tissue disorder, unspecified: Secondary | ICD-10-CM | POA: Diagnosis not present

## 2017-07-06 DIAGNOSIS — E118 Type 2 diabetes mellitus with unspecified complications: Secondary | ICD-10-CM | POA: Diagnosis not present

## 2017-07-06 DIAGNOSIS — E669 Obesity, unspecified: Secondary | ICD-10-CM | POA: Diagnosis not present

## 2017-07-06 DIAGNOSIS — R262 Difficulty in walking, not elsewhere classified: Secondary | ICD-10-CM | POA: Diagnosis not present

## 2017-07-06 DIAGNOSIS — R531 Weakness: Secondary | ICD-10-CM | POA: Diagnosis not present

## 2017-07-06 DIAGNOSIS — M25571 Pain in right ankle and joints of right foot: Secondary | ICD-10-CM | POA: Diagnosis not present

## 2017-07-07 DIAGNOSIS — R531 Weakness: Secondary | ICD-10-CM | POA: Diagnosis not present

## 2017-07-07 DIAGNOSIS — M799 Soft tissue disorder, unspecified: Secondary | ICD-10-CM | POA: Diagnosis not present

## 2017-07-07 DIAGNOSIS — E669 Obesity, unspecified: Secondary | ICD-10-CM | POA: Diagnosis not present

## 2017-07-07 DIAGNOSIS — M25871 Other specified joint disorders, right ankle and foot: Secondary | ICD-10-CM | POA: Diagnosis not present

## 2017-07-07 DIAGNOSIS — M25571 Pain in right ankle and joints of right foot: Secondary | ICD-10-CM | POA: Diagnosis not present

## 2017-07-07 DIAGNOSIS — M25671 Stiffness of right ankle, not elsewhere classified: Secondary | ICD-10-CM | POA: Diagnosis not present

## 2017-07-07 DIAGNOSIS — M6250 Muscle wasting and atrophy, not elsewhere classified, unspecified site: Secondary | ICD-10-CM | POA: Diagnosis not present

## 2017-07-07 DIAGNOSIS — R262 Difficulty in walking, not elsewhere classified: Secondary | ICD-10-CM | POA: Diagnosis not present

## 2017-07-07 DIAGNOSIS — E118 Type 2 diabetes mellitus with unspecified complications: Secondary | ICD-10-CM | POA: Diagnosis not present

## 2017-07-07 DIAGNOSIS — I10 Essential (primary) hypertension: Secondary | ICD-10-CM | POA: Diagnosis not present

## 2017-07-07 DIAGNOSIS — Z736 Limitation of activities due to disability: Secondary | ICD-10-CM | POA: Diagnosis not present

## 2017-07-11 ENCOUNTER — Ambulatory Visit
Admission: RE | Admit: 2017-07-11 | Discharge: 2017-07-11 | Disposition: A | Discharge status: Home / Self Care | Payer: PPO | Source: Ambulatory Visit | Attending: Gynecology | Admitting: Gynecology

## 2017-07-11 ENCOUNTER — Ambulatory Visit: Payer: PPO

## 2017-07-11 DIAGNOSIS — Z1231 Encounter for screening mammogram for malignant neoplasm of breast: Secondary | ICD-10-CM

## 2017-07-12 DIAGNOSIS — M6250 Muscle wasting and atrophy, not elsewhere classified, unspecified site: Secondary | ICD-10-CM | POA: Diagnosis not present

## 2017-07-12 DIAGNOSIS — M799 Soft tissue disorder, unspecified: Secondary | ICD-10-CM | POA: Diagnosis not present

## 2017-07-12 DIAGNOSIS — I10 Essential (primary) hypertension: Secondary | ICD-10-CM | POA: Diagnosis not present

## 2017-07-12 DIAGNOSIS — M25871 Other specified joint disorders, right ankle and foot: Secondary | ICD-10-CM | POA: Diagnosis not present

## 2017-07-12 DIAGNOSIS — M25671 Stiffness of right ankle, not elsewhere classified: Secondary | ICD-10-CM | POA: Diagnosis not present

## 2017-07-12 DIAGNOSIS — E669 Obesity, unspecified: Secondary | ICD-10-CM | POA: Diagnosis not present

## 2017-07-12 DIAGNOSIS — M25571 Pain in right ankle and joints of right foot: Secondary | ICD-10-CM | POA: Diagnosis not present

## 2017-07-12 DIAGNOSIS — E118 Type 2 diabetes mellitus with unspecified complications: Secondary | ICD-10-CM | POA: Diagnosis not present

## 2017-07-12 DIAGNOSIS — Z736 Limitation of activities due to disability: Secondary | ICD-10-CM | POA: Diagnosis not present

## 2017-07-12 DIAGNOSIS — R531 Weakness: Secondary | ICD-10-CM | POA: Diagnosis not present

## 2017-07-12 DIAGNOSIS — R262 Difficulty in walking, not elsewhere classified: Secondary | ICD-10-CM | POA: Diagnosis not present

## 2017-07-13 DIAGNOSIS — M799 Soft tissue disorder, unspecified: Secondary | ICD-10-CM | POA: Diagnosis not present

## 2017-07-13 DIAGNOSIS — M25571 Pain in right ankle and joints of right foot: Secondary | ICD-10-CM | POA: Diagnosis not present

## 2017-07-13 DIAGNOSIS — R262 Difficulty in walking, not elsewhere classified: Secondary | ICD-10-CM | POA: Diagnosis not present

## 2017-07-13 DIAGNOSIS — Z736 Limitation of activities due to disability: Secondary | ICD-10-CM | POA: Diagnosis not present

## 2017-07-13 DIAGNOSIS — M6250 Muscle wasting and atrophy, not elsewhere classified, unspecified site: Secondary | ICD-10-CM | POA: Diagnosis not present

## 2017-07-13 DIAGNOSIS — M25671 Stiffness of right ankle, not elsewhere classified: Secondary | ICD-10-CM | POA: Diagnosis not present

## 2017-07-13 DIAGNOSIS — M25871 Other specified joint disorders, right ankle and foot: Secondary | ICD-10-CM | POA: Diagnosis not present

## 2017-07-13 DIAGNOSIS — R531 Weakness: Secondary | ICD-10-CM | POA: Diagnosis not present

## 2017-07-13 DIAGNOSIS — E118 Type 2 diabetes mellitus with unspecified complications: Secondary | ICD-10-CM | POA: Diagnosis not present

## 2017-07-13 DIAGNOSIS — I10 Essential (primary) hypertension: Secondary | ICD-10-CM | POA: Diagnosis not present

## 2017-07-13 DIAGNOSIS — E669 Obesity, unspecified: Secondary | ICD-10-CM | POA: Diagnosis not present

## 2017-07-14 ENCOUNTER — Other Ambulatory Visit: Payer: Self-pay | Admitting: Cardiovascular Disease

## 2017-07-14 DIAGNOSIS — E785 Hyperlipidemia, unspecified: Secondary | ICD-10-CM

## 2017-07-14 NOTE — Telephone Encounter (Signed)
Please review for refill. Thanks!  

## 2017-07-18 DIAGNOSIS — M25671 Stiffness of right ankle, not elsewhere classified: Secondary | ICD-10-CM | POA: Diagnosis not present

## 2017-07-18 DIAGNOSIS — M799 Soft tissue disorder, unspecified: Secondary | ICD-10-CM | POA: Diagnosis not present

## 2017-07-18 DIAGNOSIS — R531 Weakness: Secondary | ICD-10-CM | POA: Diagnosis not present

## 2017-07-18 DIAGNOSIS — M25871 Other specified joint disorders, right ankle and foot: Secondary | ICD-10-CM | POA: Diagnosis not present

## 2017-07-18 DIAGNOSIS — E669 Obesity, unspecified: Secondary | ICD-10-CM | POA: Diagnosis not present

## 2017-07-18 DIAGNOSIS — E118 Type 2 diabetes mellitus with unspecified complications: Secondary | ICD-10-CM | POA: Diagnosis not present

## 2017-07-18 DIAGNOSIS — I10 Essential (primary) hypertension: Secondary | ICD-10-CM | POA: Diagnosis not present

## 2017-07-18 DIAGNOSIS — M6250 Muscle wasting and atrophy, not elsewhere classified, unspecified site: Secondary | ICD-10-CM | POA: Diagnosis not present

## 2017-07-18 DIAGNOSIS — R262 Difficulty in walking, not elsewhere classified: Secondary | ICD-10-CM | POA: Diagnosis not present

## 2017-07-18 DIAGNOSIS — Z736 Limitation of activities due to disability: Secondary | ICD-10-CM | POA: Diagnosis not present

## 2017-07-18 DIAGNOSIS — M25571 Pain in right ankle and joints of right foot: Secondary | ICD-10-CM | POA: Diagnosis not present

## 2017-07-19 DIAGNOSIS — M150 Primary generalized (osteo)arthritis: Secondary | ICD-10-CM | POA: Diagnosis not present

## 2017-07-19 DIAGNOSIS — M47816 Spondylosis without myelopathy or radiculopathy, lumbar region: Secondary | ICD-10-CM | POA: Diagnosis not present

## 2017-07-19 DIAGNOSIS — Z79899 Other long term (current) drug therapy: Secondary | ICD-10-CM | POA: Diagnosis not present

## 2017-07-19 DIAGNOSIS — G894 Chronic pain syndrome: Secondary | ICD-10-CM | POA: Diagnosis not present

## 2017-07-21 DIAGNOSIS — R531 Weakness: Secondary | ICD-10-CM | POA: Diagnosis not present

## 2017-07-21 DIAGNOSIS — M25571 Pain in right ankle and joints of right foot: Secondary | ICD-10-CM | POA: Diagnosis not present

## 2017-07-21 DIAGNOSIS — M25671 Stiffness of right ankle, not elsewhere classified: Secondary | ICD-10-CM | POA: Diagnosis not present

## 2017-07-21 DIAGNOSIS — R262 Difficulty in walking, not elsewhere classified: Secondary | ICD-10-CM | POA: Diagnosis not present

## 2017-07-21 DIAGNOSIS — E118 Type 2 diabetes mellitus with unspecified complications: Secondary | ICD-10-CM | POA: Diagnosis not present

## 2017-07-21 DIAGNOSIS — I10 Essential (primary) hypertension: Secondary | ICD-10-CM | POA: Diagnosis not present

## 2017-07-21 DIAGNOSIS — Z736 Limitation of activities due to disability: Secondary | ICD-10-CM | POA: Diagnosis not present

## 2017-07-21 DIAGNOSIS — E669 Obesity, unspecified: Secondary | ICD-10-CM | POA: Diagnosis not present

## 2017-07-21 DIAGNOSIS — M6250 Muscle wasting and atrophy, not elsewhere classified, unspecified site: Secondary | ICD-10-CM | POA: Diagnosis not present

## 2017-07-21 DIAGNOSIS — M799 Soft tissue disorder, unspecified: Secondary | ICD-10-CM | POA: Diagnosis not present

## 2017-07-21 DIAGNOSIS — M25871 Other specified joint disorders, right ankle and foot: Secondary | ICD-10-CM | POA: Diagnosis not present

## 2017-07-22 DIAGNOSIS — E669 Obesity, unspecified: Secondary | ICD-10-CM | POA: Diagnosis not present

## 2017-07-22 DIAGNOSIS — Z736 Limitation of activities due to disability: Secondary | ICD-10-CM | POA: Diagnosis not present

## 2017-07-22 DIAGNOSIS — M799 Soft tissue disorder, unspecified: Secondary | ICD-10-CM | POA: Diagnosis not present

## 2017-07-22 DIAGNOSIS — I10 Essential (primary) hypertension: Secondary | ICD-10-CM | POA: Diagnosis not present

## 2017-07-22 DIAGNOSIS — M25571 Pain in right ankle and joints of right foot: Secondary | ICD-10-CM | POA: Diagnosis not present

## 2017-07-22 DIAGNOSIS — M25671 Stiffness of right ankle, not elsewhere classified: Secondary | ICD-10-CM | POA: Diagnosis not present

## 2017-07-22 DIAGNOSIS — M6250 Muscle wasting and atrophy, not elsewhere classified, unspecified site: Secondary | ICD-10-CM | POA: Diagnosis not present

## 2017-07-22 DIAGNOSIS — R531 Weakness: Secondary | ICD-10-CM | POA: Diagnosis not present

## 2017-07-22 DIAGNOSIS — M25871 Other specified joint disorders, right ankle and foot: Secondary | ICD-10-CM | POA: Diagnosis not present

## 2017-07-22 DIAGNOSIS — R262 Difficulty in walking, not elsewhere classified: Secondary | ICD-10-CM | POA: Diagnosis not present

## 2017-07-22 DIAGNOSIS — E118 Type 2 diabetes mellitus with unspecified complications: Secondary | ICD-10-CM | POA: Diagnosis not present

## 2017-07-25 ENCOUNTER — Ambulatory Visit (INDEPENDENT_AMBULATORY_CARE_PROVIDER_SITE_OTHER): Payer: PPO | Admitting: Orthopaedic Surgery

## 2017-07-25 DIAGNOSIS — Z23 Encounter for immunization: Secondary | ICD-10-CM | POA: Diagnosis not present

## 2017-07-26 ENCOUNTER — Telehealth (INDEPENDENT_AMBULATORY_CARE_PROVIDER_SITE_OTHER): Payer: Self-pay | Admitting: Orthopaedic Surgery

## 2017-07-26 ENCOUNTER — Ambulatory Visit (INDEPENDENT_AMBULATORY_CARE_PROVIDER_SITE_OTHER): Payer: PPO | Admitting: Physician Assistant

## 2017-07-26 DIAGNOSIS — M76821 Posterior tibial tendinitis, right leg: Secondary | ICD-10-CM

## 2017-07-26 DIAGNOSIS — M1712 Unilateral primary osteoarthritis, left knee: Secondary | ICD-10-CM | POA: Diagnosis not present

## 2017-07-26 MED ORDER — DICLOFENAC SODIUM 2 % TD SOLN: 2.0000 | Freq: Two times a day (BID) | TRANSDERMAL | 1 refills | Status: DC | PRN

## 2017-07-26 NOTE — Progress Notes (Signed)
Office Visit Note   Patient: Lauren [name]           Date of Birth: 02/10/1946           MRN: [id number] Visit Date: 07/26/2017              Requested by: Lawerance Cruel, [place], [place] [address] PCP: Lawerance Cruel, MD   Assessment & Plan: Visit Diagnoses:  1. Primary osteoarthritis of left knee   2. Posterior tibial tendinitis, right leg     Plan: Continue PT for posterior tibial tendinitis and quad strengthening. Shoe wear discussed. Compression socks for right ankle swelling. Pennsaid to right ankle four times daily. Due to fact she has failed conservative treatment of left knee arthritis which included medication, PT, knee arthroscopy and has significant pain that effects her life recommend left total knee arthroplasty.Risk including DVT/ PE, worsening pain, prolonged pain, wound healing problems, nerve or vessel injury all discussed Questions encouraged and answered. Will precede with surgery in near future follow up 2 weeks post-op.   Follow-Up Instructions: Return for 2weeks , post op.   Orders:  No orders of the defined types were placed in this encounter.  Meds ordered this encounter  Medications  . Diclofenac Sodium (PENNSAID) 2 % SOLN    Sig: Place 2 Squirts onto the skin 2 (two) times daily as needed.    Dispense:  1 Bottle    Refill:  1      Procedures: No procedures performed   Clinical Data: No additional findings.   Subjective: Left knee pain  Right posterior tibial tendinitis  HPI Lauren [name] is a 71 year old female well known to Dr. [name] with severe arthritis left knee. She's failed conservative treatment which included cortisone injections, supplemental injections, knee arthroscopy, PT medications including anti-inflammatories and time. She ambulates with a walker due to the fact that she feels that her knee may give way on her at any point time a cause her to fall. She is also having pain in her right ankle  due to posterior tibial tendinitis. Physical therapy has helped with this but she still has significant pain medial aspect of the ankle now developing some pain lateral ankle and swelling in the ankle. She tries to wear shoes with good arch support but at times does wear bedroom slippers around the home. Review of Systems  Negative for chest pain, shortness of breath , fevers, chills.   Objective: Vital Signs: There were no vitals taken for this visit.  Physical Exam  Constitutional: She is oriented to person, place, and time. She appears well-developed and well-nourished. No distress.  Pulmonary/Chest: Effort normal.  Neurological: She is alert and oriented to person, place, and time.  Skin: She is not diaphoretic.  Psychiatric: She has a normal mood and affect.    Ortho Exam Significant crepitus left knee. No effusion or abnormal warmth left knee . Good range of motion right knee . No instability with valgus varus stressing.  Tenderness over right posterior tibial tendon. 5/5 strength with inversion against resistance right foot. Difficulty with single heel raise bilaterally. Right ankle global edema . Right calf supple non tender.  Specialty Comments:  No specialty comments available.  Imaging: No results found.   PMFS History: Patient Active Problem List   Diagnosis Date Noted  . Trochanteric bursitis, right hip 05/10/2017  . Primary osteoarthritis of left knee 03/09/2017  . Unilateral primary osteoarthritis, right hip 11/26/2016  .  Status post total replacement of right hip 11/26/2016  . Hiatal hernia 04/13/2013  . Type II or unspecified type diabetes mellitus without mention of complication, uncontrolled 01/26/2013  . Umbilical hernia 82/42/3536  . Hypertension    Past Medical History:  Diagnosis Date  . Arthritis    Back, Hip- right  . Diabetes mellitus without complication ([place])    Type II  . Endometriosis   . Family history of adverse reaction to anesthesia     Father - N/V - blood pressure  . GERD (gastroesophageal reflux disease)   . History of kidney stones   . History of ulcerative colitis   . Hypertension   . Insomnia   . 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 Neuropathy   . Pneumonia    1983, 2003  . Umbilical hernia   . Vitamin D deficiency     Family History  Problem Relation Age of Onset  . Hypertension Father   . Heart disease Father   . Diabetes Father   . Cancer Father        colon, prostate, and kidney  . Hypertension Mother   . Parkinson's disease Mother   . Heart disease Maternal Grandmother   . Diabetes Maternal Grandmother   . Heart disease Maternal Grandfather   . Diabetes Maternal Grandfather   . Cancer Maternal Grandfather        pt unaware of what kind  . Stroke Paternal Grandmother   . Stroke Paternal Grandfather     Past Surgical History:  Procedure Laterality Date  . BREAST BIOPSY  08-13-2009  DR TSUEI   EXCISION LEFT NIPPLE DUCT  . CHOLECYSTECTOMY  1992  . COLON RESECTION  1986   ULCERATIVE COLITIS  . COLON SURGERY    . ESOPHAGEAL MANOMETRY N/A 06/11/2013   Procedure: ESOPHAGEAL MANOMETRY (EM);  Surgeon: Jeryl Columbia, MD;  Location: [facility];  Service: Endoscopy;  Laterality: N/A;  . ESOPHAGOGASTRODUODENOSCOPY (EGD) WITH PROPOFOL N/A 05/31/2016   Procedure: ESOPHAGOGASTRODUODENOSCOPY (EGD) WITH PROPOFOL;  Surgeon: Clarene Essex, MD;  Location: [facility] ENDOSCOPY;  Service: Endoscopy;  Laterality: N/A;  . EXCISION RIGHT BREAST MASS   10-20-2005  DR Marylene Buerger  . kidney stone removed  2/11  . KNEE ARTHROSCOPY Left    MCL  . LEFT THUMB CARPOMETACARPAL JOINT SUSPENSIONPLASTY  04-06-2006  DR Burney Gauze  . LEFT WRIST ARTHROSCOPY W/ DEBRIDEMENT AND REMOVAL CYST  03-26-2004  DR Burney Gauze  . MYOMECTOMY    . RIGHT COLECTOMY    . RIGHT URETEROSCOPIC STONE EXTRACTION  12-11-2009  DR Irine Seal  . TENDON RELEASE    . TOTAL HIP ARTHROPLASTY Right 11/26/2016   Procedure: RIGHT TOTAL HIP ARTHROPLASTY ANTERIOR APPROACH;   Surgeon: Mcarthur Rossetti, MD;  Location: [facility];  Service: Orthopedics;  Laterality: Right;  . WRIST SURGERY     both   Social History   Occupational History  . Not on file.   Social History Main Topics  . Smoking status: Former Smoker    Packs/day: 1.00    Years: 10.00    Types: Cigarettes    Quit date: 09/22/1982  . Smokeless tobacco: Never Used     Comment: quit 1983  . Alcohol use No  . Drug use: No  . Sexual activity: Yes    Partners: Male    Birth control/ protection: Post-menopausal

## 2017-07-26 NOTE — Telephone Encounter (Signed)
[place] called stating that the patients pensaid is not covered by her insurance and was wondering if she could be prescribed something else. Thank you. CB # [phone]

## 2017-07-28 NOTE — Telephone Encounter (Signed)
LMOM for patient that I am trying to get her a sample of the Pennsaid

## 2017-08-05 DIAGNOSIS — M25671 Stiffness of right ankle, not elsewhere classified: Secondary | ICD-10-CM | POA: Diagnosis not present

## 2017-08-05 DIAGNOSIS — Z736 Limitation of activities due to disability: Secondary | ICD-10-CM | POA: Diagnosis not present

## 2017-08-05 DIAGNOSIS — R262 Difficulty in walking, not elsewhere classified: Secondary | ICD-10-CM | POA: Diagnosis not present

## 2017-08-05 DIAGNOSIS — R531 Weakness: Secondary | ICD-10-CM | POA: Diagnosis not present

## 2017-08-05 DIAGNOSIS — M799 Soft tissue disorder, unspecified: Secondary | ICD-10-CM | POA: Diagnosis not present

## 2017-08-05 DIAGNOSIS — E669 Obesity, unspecified: Secondary | ICD-10-CM | POA: Diagnosis not present

## 2017-08-05 DIAGNOSIS — M25571 Pain in right ankle and joints of right foot: Secondary | ICD-10-CM | POA: Diagnosis not present

## 2017-08-05 DIAGNOSIS — M25871 Other specified joint disorders, right ankle and foot: Secondary | ICD-10-CM | POA: Diagnosis not present

## 2017-08-05 DIAGNOSIS — E118 Type 2 diabetes mellitus with unspecified complications: Secondary | ICD-10-CM | POA: Diagnosis not present

## 2017-08-05 DIAGNOSIS — I10 Essential (primary) hypertension: Secondary | ICD-10-CM | POA: Diagnosis not present

## 2017-08-05 DIAGNOSIS — M6250 Muscle wasting and atrophy, not elsewhere classified, unspecified site: Secondary | ICD-10-CM | POA: Diagnosis not present

## 2017-08-09 DIAGNOSIS — M799 Soft tissue disorder, unspecified: Secondary | ICD-10-CM | POA: Diagnosis not present

## 2017-08-09 DIAGNOSIS — I10 Essential (primary) hypertension: Secondary | ICD-10-CM | POA: Diagnosis not present

## 2017-08-09 DIAGNOSIS — R262 Difficulty in walking, not elsewhere classified: Secondary | ICD-10-CM | POA: Diagnosis not present

## 2017-08-09 DIAGNOSIS — E669 Obesity, unspecified: Secondary | ICD-10-CM | POA: Diagnosis not present

## 2017-08-09 DIAGNOSIS — Z736 Limitation of activities due to disability: Secondary | ICD-10-CM | POA: Diagnosis not present

## 2017-08-09 DIAGNOSIS — E118 Type 2 diabetes mellitus with unspecified complications: Secondary | ICD-10-CM | POA: Diagnosis not present

## 2017-08-09 DIAGNOSIS — M6250 Muscle wasting and atrophy, not elsewhere classified, unspecified site: Secondary | ICD-10-CM | POA: Diagnosis not present

## 2017-08-09 DIAGNOSIS — M25671 Stiffness of right ankle, not elsewhere classified: Secondary | ICD-10-CM | POA: Diagnosis not present

## 2017-08-09 DIAGNOSIS — M25871 Other specified joint disorders, right ankle and foot: Secondary | ICD-10-CM | POA: Diagnosis not present

## 2017-08-09 DIAGNOSIS — R531 Weakness: Secondary | ICD-10-CM | POA: Diagnosis not present

## 2017-08-09 DIAGNOSIS — M25571 Pain in right ankle and joints of right foot: Secondary | ICD-10-CM | POA: Diagnosis not present

## 2017-08-12 DIAGNOSIS — E669 Obesity, unspecified: Secondary | ICD-10-CM | POA: Diagnosis not present

## 2017-08-12 DIAGNOSIS — M6250 Muscle wasting and atrophy, not elsewhere classified, unspecified site: Secondary | ICD-10-CM | POA: Diagnosis not present

## 2017-08-12 DIAGNOSIS — R531 Weakness: Secondary | ICD-10-CM | POA: Diagnosis not present

## 2017-08-12 DIAGNOSIS — E118 Type 2 diabetes mellitus with unspecified complications: Secondary | ICD-10-CM | POA: Diagnosis not present

## 2017-08-12 DIAGNOSIS — R262 Difficulty in walking, not elsewhere classified: Secondary | ICD-10-CM | POA: Diagnosis not present

## 2017-08-12 DIAGNOSIS — M25671 Stiffness of right ankle, not elsewhere classified: Secondary | ICD-10-CM | POA: Diagnosis not present

## 2017-08-12 DIAGNOSIS — I10 Essential (primary) hypertension: Secondary | ICD-10-CM | POA: Diagnosis not present

## 2017-08-12 DIAGNOSIS — M25571 Pain in right ankle and joints of right foot: Secondary | ICD-10-CM | POA: Diagnosis not present

## 2017-08-12 DIAGNOSIS — M799 Soft tissue disorder, unspecified: Secondary | ICD-10-CM | POA: Diagnosis not present

## 2017-08-12 DIAGNOSIS — Z736 Limitation of activities due to disability: Secondary | ICD-10-CM | POA: Diagnosis not present

## 2017-08-12 DIAGNOSIS — M25871 Other specified joint disorders, right ankle and foot: Secondary | ICD-10-CM | POA: Diagnosis not present

## 2017-08-16 ENCOUNTER — Other Ambulatory Visit (INDEPENDENT_AMBULATORY_CARE_PROVIDER_SITE_OTHER): Payer: Self-pay

## 2017-08-17 NOTE — Progress Notes (Signed)
Please place orders in EPIC as patient is being scheduled for a pre-op appointment! Thank you! 

## 2017-08-23 ENCOUNTER — Other Ambulatory Visit (HOSPITAL_COMMUNITY): Payer: Self-pay | Admitting: Emergency Medicine

## 2017-08-23 NOTE — Patient Instructions (Signed)
Lauren [name]  08/23/2017   Your procedure is scheduled on: 09-02-17  Report to [facility] Main  Entrance    Report to admitting at 830AM   Call this number if you have problems the morning of surgery  [phone]   Remember: ONLY 1 PERSON MAY GO WITH YOU TO SHORT STAY TO GET  READY MORNING OF YOUR SURGERY.  Do not eat food or drink liquids :After Midnight.     Take these medicines the morning of surgery with A SIP OF WATER: rosuvastatin(crestor), pantoprazole(protonix), hydrocodone if needed, nasal spray if needed                                You may not have any metal on your body including hair pins and              piercings  Do not wear jewelry, make-up, lotions, powders or perfumes, deodorant             Do not wear nail polish.  Do not shave  48 hours prior to surgery.             Do not bring valuables to the hospital. [place].  Contacts, dentures or bridgework may not be worn into surgery.  Leave suitcase in the car. After surgery it may be brought to your room.                Please read over the following fact sheets you were given: _____________________________________________________________________            How to Manage Your Diabetes Before and After Surgery  Why is it important to control my blood sugar before and after surgery? . Improving blood sugar levels before and after surgery helps healing and can limit problems. . A way of improving blood sugar control is eating a healthy diet by: o  Eating less sugar and carbohydrates o  Increasing activity/exercise o  Talking with your doctor about reaching your blood sugar goals . High blood sugars (greater than 180 mg/dL) can raise your risk of infections and slow your recovery, so you will need to focus on controlling your diabetes during the weeks before surgery. . Make sure that the doctor who takes care of your diabetes  knows about your planned surgery including the date and location.  How do I manage my blood sugar before surgery? . Check your blood sugar at least 4 times a day, starting 2 days before surgery, to make sure that the level is not too high or low. o Check your blood sugar the morning of your surgery when you wake up and every 2 hours until you get to the Short Stay unit. . If your blood sugar is less than 70 mg/dL, you will need to treat for low blood sugar: o Do not take insulin. o Treat a low blood sugar (less than 70 mg/dL) with  cup of clear juice (cranberry or apple), 4 glucose tablets, OR glucose gel. o Recheck blood sugar in 15 minutes after treatment (to make sure it is greater than 70 mg/dL). If your blood sugar is not greater than 70 mg/dL on recheck, call [phone] for further instructions. . Report your blood sugar to  the short stay nurse when you get to Short Stay.  . If you are admitted to the hospital after surgery: o Your blood sugar will be checked by the staff and you will probably be given insulin after surgery (instead of oral diabetes medicines) to make sure you have good blood sugar levels. o The goal for blood sugar control after surgery is 80-180 mg/dL.   WHAT DO I DO ABOUT MY DIABETES MEDICATION?   . THE DAY BEFORE SURGERY, take  GLIMEPIRIDE and METFORMIN as normal      . THE MORNING OF SURGERY, DO NOT TAKE ANY DIABETES MEDICATIONS!!   Patient Signature:  Date:   Nurse Signature:  Date:   Reviewed and Endorsed by [facility] Patient Education Committee, August 2015   [facility] - Preparing for Surgery Before surgery, you can play an important role.  Because skin is not sterile, your skin needs to be as free of germs as possible.  You can reduce the number of germs on your skin by washing with CHG (chlorahexidine gluconate) soap before surgery.  CHG is an antiseptic cleaner which kills germs and bonds with the skin to continue killing germs even after  washing. Please DO NOT use if you have an allergy to CHG or antibacterial soaps.  If your skin becomes reddened/irritated stop using the CHG and inform your nurse when you arrive at Short Stay. Do not shave (including legs and underarms) for at least 48 hours prior to the first CHG shower.  You may shave your face/neck. Please follow these instructions carefully:  1.  Shower with CHG Soap the night before surgery and the  morning of Surgery.  2.  If you choose to wash your hair, wash your hair first as usual with your  normal  shampoo.  3.  After you shampoo, rinse your hair and body thoroughly to remove the  shampoo.                           4.  Use CHG as you would any other liquid soap.  You can apply chg directly  to the skin and wash                       Gently with a scrungie or clean washcloth.  5.  Apply the CHG Soap to your body ONLY FROM THE NECK DOWN.   Do not use on face/ open                           Wound or open sores. Avoid contact with eyes, ears mouth and genitals (private parts).                       Wash face,  Genitals (private parts) with your normal soap.             6.  Wash thoroughly, paying special attention to the area where your surgery  will be performed.  7.  Thoroughly rinse your body with warm water from the neck down.  8.  DO NOT shower/wash with your normal soap after using and rinsing off  the CHG Soap.                9.  Pat yourself dry with a clean towel.            10.  Wear clean pajamas.  11.  Place clean sheets on your bed the night of your first shower and do not  sleep with pets. Day of Surgery : Do not apply any lotions/deodorants the morning of surgery.  Please wear clean clothes to the hospital/surgery center.  FAILURE TO FOLLOW THESE INSTRUCTIONS MAY RESULT IN THE CANCELLATION OF YOUR SURGERY PATIENT SIGNATURE_________________________________  NURSE  SIGNATURE__________________________________  ________________________________________________________________________

## 2017-08-23 NOTE — Progress Notes (Signed)
CT chest 05-26-17   [facility] cardiology Dr Jonelle Sidle [place] 01-28-17 epic  STRESS TEST 11-19-16 epic  EKG 11-15-16 epic

## 2017-08-24 ENCOUNTER — Encounter (HOSPITAL_COMMUNITY): Payer: Self-pay

## 2017-08-24 ENCOUNTER — Encounter (HOSPITAL_COMMUNITY)
Admission: RE | Admit: 2017-08-24 | Discharge: 2017-08-24 | Disposition: A | Discharge status: Home / Self Care | Payer: PPO | Source: Ambulatory Visit | Attending: Orthopaedic Surgery | Admitting: Orthopaedic Surgery

## 2017-08-24 ENCOUNTER — Other Ambulatory Visit (INDEPENDENT_AMBULATORY_CARE_PROVIDER_SITE_OTHER): Payer: Self-pay | Admitting: Physician Assistant

## 2017-08-24 DIAGNOSIS — Z01812 Encounter for preprocedural laboratory examination: Secondary | ICD-10-CM | POA: Insufficient documentation

## 2017-08-24 DIAGNOSIS — M1712 Unilateral primary osteoarthritis, left knee: Secondary | ICD-10-CM | POA: Diagnosis not present

## 2017-08-24 LAB — CBC
HCT: 39.8 % (ref 36.0–46.0)
Hemoglobin: 12.6 g/dL (ref 12.0–15.0)
MCH: 28.1 pg (ref 26.0–34.0)
MCHC: 31.7 g/dL (ref 30.0–36.0)
MCV: 88.6 fL (ref 78.0–100.0)
PLATELETS: 284 10*3/uL (ref 150–400)
RBC: 4.49 MIL/uL (ref 3.87–5.11)
RDW: 14.5 % (ref 11.5–15.5)
WBC: 9.1 10*3/uL (ref 4.0–10.5)

## 2017-08-24 LAB — SURGICAL PCR SCREEN
MRSA, PCR: NEGATIVE
STAPHYLOCOCCUS AUREUS: NEGATIVE

## 2017-08-24 LAB — BASIC METABOLIC PANEL
Anion gap: 11 (ref 5–15)
BUN: 13 mg/dL (ref 6–20)
CALCIUM: 9.1 mg/dL (ref 8.9–10.3)
CHLORIDE: 104 mmol/L (ref 101–111)
CO2: 25 mmol/L (ref 22–32)
CREATININE: 0.86 mg/dL (ref 0.44–1.00)
Glucose, Bld: 164 mg/dL — ABNORMAL HIGH (ref 65–99)
Potassium: 4.7 mmol/L (ref 3.5–5.1)
SODIUM: 140 mmol/L (ref 135–145)

## 2017-08-24 LAB — HEMOGLOBIN A1C
HEMOGLOBIN A1C: 7.2 % — AB (ref 4.8–5.6)
MEAN PLASMA GLUCOSE: 159.94 mg/dL

## 2017-08-24 LAB — GLUCOSE, CAPILLARY: GLUCOSE-CAPILLARY: 193 mg/dL — AB (ref 65–99)

## 2017-08-29 DIAGNOSIS — Z79891 Long term (current) use of opiate analgesic: Secondary | ICD-10-CM | POA: Diagnosis not present

## 2017-08-29 DIAGNOSIS — M47816 Spondylosis without myelopathy or radiculopathy, lumbar region: Secondary | ICD-10-CM | POA: Diagnosis not present

## 2017-08-29 DIAGNOSIS — M150 Primary generalized (osteo)arthritis: Secondary | ICD-10-CM | POA: Diagnosis not present

## 2017-08-29 DIAGNOSIS — G894 Chronic pain syndrome: Secondary | ICD-10-CM | POA: Diagnosis not present

## 2017-08-29 DIAGNOSIS — Z79899 Other long term (current) drug therapy: Secondary | ICD-10-CM | POA: Diagnosis not present

## 2017-08-30 DIAGNOSIS — E7800 Pure hypercholesterolemia, unspecified: Secondary | ICD-10-CM | POA: Diagnosis not present

## 2017-08-30 DIAGNOSIS — K5289 Other specified noninfective gastroenteritis and colitis: Secondary | ICD-10-CM | POA: Diagnosis not present

## 2017-08-30 DIAGNOSIS — E559 Vitamin D deficiency, unspecified: Secondary | ICD-10-CM | POA: Diagnosis not present

## 2017-08-30 DIAGNOSIS — K219 Gastro-esophageal reflux disease without esophagitis: Secondary | ICD-10-CM | POA: Diagnosis not present

## 2017-08-30 DIAGNOSIS — I10 Essential (primary) hypertension: Secondary | ICD-10-CM | POA: Diagnosis not present

## 2017-08-30 DIAGNOSIS — Z79899 Other long term (current) drug therapy: Secondary | ICD-10-CM | POA: Diagnosis not present

## 2017-08-30 DIAGNOSIS — Z7984 Long term (current) use of oral hypoglycemic drugs: Secondary | ICD-10-CM | POA: Diagnosis not present

## 2017-08-30 DIAGNOSIS — I700 Atherosclerosis of aorta: Secondary | ICD-10-CM | POA: Diagnosis not present

## 2017-08-30 DIAGNOSIS — E1165 Type 2 diabetes mellitus with hyperglycemia: Secondary | ICD-10-CM | POA: Diagnosis not present

## 2017-08-30 DIAGNOSIS — Z0000 Encounter for general adult medical examination without abnormal findings: Secondary | ICD-10-CM | POA: Diagnosis not present

## 2017-09-01 ENCOUNTER — Telehealth (INDEPENDENT_AMBULATORY_CARE_PROVIDER_SITE_OTHER): Payer: Self-pay | Admitting: Orthopaedic Surgery

## 2017-09-01 NOTE — Telephone Encounter (Signed)
Patient has surgery with Dr. [name] tomorrow and she called saying she has had an upset stomach all week and was a little worried about having her surgery and just wanted Dr. Adela Ports opinion. CB # [phone]

## 2017-09-01 NOTE — Telephone Encounter (Signed)
Canceled.

## 2017-09-02 ENCOUNTER — Inpatient Hospital Stay (HOSPITAL_COMMUNITY): Admission: RE | Admit: 2017-09-02 | Payer: PPO | Source: Ambulatory Visit | Admitting: Orthopaedic Surgery

## 2017-09-02 ENCOUNTER — Encounter (HOSPITAL_COMMUNITY): Admission: RE | Payer: Self-pay | Source: Ambulatory Visit

## 2017-09-02 SURGERY — ARTHROPLASTY, KNEE, TOTAL
Anesthesia: Spinal | Site: Knee | Laterality: Left

## 2017-09-06 ENCOUNTER — Telehealth (INDEPENDENT_AMBULATORY_CARE_PROVIDER_SITE_OTHER): Payer: Self-pay | Admitting: Orthopaedic Surgery

## 2017-09-06 NOTE — Telephone Encounter (Signed)
Lauren [name], [place] [birth year]    [employer]  [place] [phone] Please call when done   Pt surgery was canceled yesterday and they stated we have until Nov26th to resched before the authorization is no longer good.

## 2017-09-06 NOTE — Telephone Encounter (Signed)
FYI

## 2017-09-19 NOTE — Telephone Encounter (Signed)
Lauren [name] is still working on getting this patient rescheduled and will advise me of the new date once done

## 2017-09-27 DIAGNOSIS — M150 Primary generalized (osteo)arthritis: Secondary | ICD-10-CM | POA: Diagnosis not present

## 2017-09-27 DIAGNOSIS — Z79899 Other long term (current) drug therapy: Secondary | ICD-10-CM | POA: Diagnosis not present

## 2017-09-27 DIAGNOSIS — G894 Chronic pain syndrome: Secondary | ICD-10-CM | POA: Diagnosis not present

## 2017-09-27 DIAGNOSIS — M47816 Spondylosis without myelopathy or radiculopathy, lumbar region: Secondary | ICD-10-CM | POA: Diagnosis not present

## 2017-09-27 NOTE — Telephone Encounter (Signed)
Submitted request on acuity web site. See referral.

## 2017-09-27 NOTE — Telephone Encounter (Signed)
Rescheduled surgery to 11/04/17.

## 2017-10-12 ENCOUNTER — Other Ambulatory Visit (INDEPENDENT_AMBULATORY_CARE_PROVIDER_SITE_OTHER): Payer: Self-pay

## 2017-10-22 ENCOUNTER — Other Ambulatory Visit (INDEPENDENT_AMBULATORY_CARE_PROVIDER_SITE_OTHER): Payer: Self-pay | Admitting: Physician Assistant

## 2017-10-27 NOTE — Progress Notes (Signed)
11-15-16 (Epic) EKG  11-29-16 (Epic) Stress EF 75%, low risk  01-28-17 (Epic) LOV with Dr Skeet Latch, Cardiologist. F/u in 1 year  05-26-17 (Epic) CXT

## 2017-10-27 NOTE — Patient Instructions (Signed)
Lauren [name]  10/27/2017   Your procedure is scheduled on: 11-04-17   Report to [name] Main  Entrance Take [place] Elevators to 3rd floor to [place] at 6:30 AM.   Call this number if you have problems the morning of surgery [phone]    Remember: ONLY 1 PERSON MAY GO WITH YOU TO SHORT STAY TO GET  READY MORNING OF [name].  Do not eat food or drink liquids :After Midnight.     Take these medicines the morning of surgery with A SIP OF WATER: Pantoprazole (Protonix). You may also bring and use your nasal spray as needed. DO NOT TAKE ANY DIABETIC MEDICATIONS DAY OF YOUR SURGERY                               You may not have any metal on your body including hair pins and              piercings  Do not wear jewelry, make-up, lotions, powders or perfumes, deodorant             Do not wear nail polish.  Do not shave  48 hours prior to surgery.     Do not bring valuables to the hospital. [place].  Contacts, dentures or bridgework may not be worn into surgery.  Leave suitcase in the car. After surgery it may be brought to your room.                   Please read over the following fact sheets you were given: _____________________________________________________________________  How to Manage Your Diabetes Before and After Surgery  Why is it important to control my blood sugar before and after surgery? . Improving blood sugar levels before and after surgery helps healing and can limit problems. . A way of improving blood sugar control is eating a healthy diet by: o  Eating less sugar and carbohydrates o  Increasing activity/exercise o  Talking with your doctor about reaching your blood sugar goals . High blood sugars (greater than 180 mg/dL) can raise your risk of infections and slow your recovery, so you will need to focus on controlling your diabetes during the weeks before  surgery. . Make sure that the doctor who takes care of your diabetes knows about your planned surgery including the date and location.  How do I manage my blood sugar before surgery? . Check your blood sugar at least 4 times a day, starting 2 days before surgery, to make sure that the level is not too high or low. o Check your blood sugar the morning of your surgery when you wake up and every 2 hours until you get to the Short Stay unit. . If your blood sugar is less than 70 mg/dL, you will need to treat for low blood sugar: o Do not take insulin. o Treat a low blood sugar (less than 70 mg/dL) with  cup of clear juice (cranberry or apple), 4 glucose tablets, OR glucose gel. o Recheck blood sugar in 15 minutes after treatment (to make sure it is greater than 70 mg/dL). If your blood sugar is not greater than 70 mg/dL on recheck, call [phone] for further instructions. . Report your  blood sugar to the short stay nurse when you get to Short Stay.  . If you are admitted to the hospital after surgery: o Your blood sugar will be checked by the staff and you will probably be given insulin after surgery (instead of oral diabetes medicines) to make sure you have good blood sugar levels. o The goal for blood sugar control after surgery is 80-180 mg/dL.   WHAT DO I DO ABOUT MY DIABETES MEDICATION?  Marland Kitchen Do not take oral diabetes medicines (pills) the morning of surgery.  . THE DAY BEFORE SURGERY, take your usual dose of Glimepiride (Amaryl) and Metformin                 [facility] - Preparing for Surgery Before surgery, you can play an important role.  Because skin is not sterile, your skin needs to be as free of germs as possible.  You can reduce the number of germs on your skin by washing with CHG (chlorahexidine gluconate) soap before surgery.  CHG is an antiseptic cleaner which kills germs and bonds with the skin to continue killing germs even after washing. Please DO NOT use if you have an  allergy to CHG or antibacterial soaps.  If your skin becomes reddened/irritated stop using the CHG and inform your nurse when you arrive at Short Stay. Do not shave (including legs and underarms) for at least 48 hours prior to the first CHG shower.  You may shave your face/neck. Please follow these instructions carefully:  1.  Shower with CHG Soap the night before surgery and the  morning of Surgery.  2.  If you choose to wash your hair, wash your hair first as usual with your  normal  shampoo.  3.  After you shampoo, rinse your hair and body thoroughly to remove the  shampoo.                           4.  Use CHG as you would any other liquid soap.  You can apply chg directly  to the skin and wash                       Gently with a scrungie or clean washcloth.  5.  Apply the CHG Soap to your body ONLY FROM THE NECK DOWN.   Do not use on face/ open                           Wound or open sores. Avoid contact with eyes, ears mouth and genitals (private parts).                       Wash face,  Genitals (private parts) with your normal soap.             6.  Wash thoroughly, paying special attention to the area where your surgery  will be performed.  7.  Thoroughly rinse your body with warm water from the neck down.  8.  DO NOT shower/wash with your normal soap after using and rinsing off  the CHG Soap.                9.  Pat yourself dry with a clean towel.            10.  Wear clean pajamas.  11.  Place clean sheets on your bed the night of your first shower and do not  sleep with pets. Day of Surgery : Do not apply any lotions/deodorants the morning of surgery.  Please wear clean clothes to the hospital/surgery center.  FAILURE TO FOLLOW THESE INSTRUCTIONS MAY RESULT IN THE CANCELLATION OF YOUR SURGERY PATIENT SIGNATURE_________________________________  NURSE  SIGNATURE__________________________________  ________________________________________________________________________   [name]  An incentive spirometer is a tool that can help keep your lungs clear and active. This tool measures how well you are filling your lungs with each breath. Taking long deep breaths may help reverse or decrease the chance of developing breathing (pulmonary) problems (especially infection) following:  A long period of time when you are unable to move or be active. BEFORE THE PROCEDURE   If the spirometer includes an indicator to show your [place] effort, your nurse or respiratory therapist will set it to a desired goal.  If possible, sit up straight or lean slightly forward. Try not to slouch.  Hold the incentive spirometer in an upright position. INSTRUCTIONS FOR USE  1. Sit on the edge of your bed if possible, or sit up as far as you can in bed or on a chair. 2. Hold the incentive spirometer in an upright position. 3. Breathe out normally. 4. Place the mouthpiece in your mouth and seal your lips tightly around it. 5. Breathe in slowly and as deeply as possible, raising the piston or the ball toward the top of the column. 6. Hold your breath for 3-5 seconds or for as long as possible. Allow the piston or ball to fall to the bottom of the column. 7. Remove the mouthpiece from your mouth and breathe out normally. 8. Rest for a few seconds and repeat Steps 1 through 7 at least 10 times every 1-2 hours when you are awake. Take your time and take a few normal breaths between deep breaths. 9. The spirometer may include an indicator to show your [place] effort. Use the indicator as a goal to work toward during each repetition. 10. After each set of 10 deep breaths, practice coughing to be sure your lungs are clear. If you have an incision (the cut made at the time of surgery), support your incision when coughing by placing a pillow or rolled up towels firmly  against it. Once you are able to get out of bed, walk around indoors and cough well. You may stop using the incentive spirometer when instructed by your caregiver.  RISKS AND COMPLICATIONS  Take your time so you do not get dizzy or light-headed.  If you are in pain, you may need to take or ask for pain medication before doing incentive spirometry. It is harder to take a deep breath if you are having pain. AFTER USE  Rest and breathe slowly and easily.  It can be helpful to keep track of a log of your progress. Your caregiver can provide you with a simple table to help with this. If you are using the spirometer at home, follow these instructions: [place] IF:   You are having difficultly using the spirometer.  You have trouble using the spirometer as often as instructed.  Your pain medication is not giving enough relief while using the spirometer.  You develop fever of 100.5 F (38.1 C) or higher. SEEK IMMEDIATE MEDICAL CARE IF:   You cough up bloody sputum that had not been present before.  You develop fever of 102 F (38.9 C)  or greater.  You develop worsening pain at or near the incision site. MAKE SURE YOU:   Understand these instructions.  Will watch your condition.  Will get help right away if you are not doing well or get worse. Document Released: 02/28/2007 Document Revised: 01/10/2012 Document Reviewed: 05/01/2007 [facility] Patient Information 2014 [place], [place].   ________________________________________________________________________

## 2017-10-28 ENCOUNTER — Telehealth (INDEPENDENT_AMBULATORY_CARE_PROVIDER_SITE_OTHER): Payer: Self-pay | Admitting: Orthopaedic Surgery

## 2017-10-28 ENCOUNTER — Inpatient Hospital Stay (HOSPITAL_COMMUNITY)
Admission: RE | Admit: 2017-10-28 | Discharge: 2017-10-28 | Disposition: A | Discharge status: Home / Self Care | Payer: PPO | Source: Ambulatory Visit | Attending: Orthopaedic Surgery | Admitting: Orthopaedic Surgery

## 2017-10-28 DIAGNOSIS — R0781 Pleurodynia: Secondary | ICD-10-CM | POA: Diagnosis not present

## 2017-10-28 NOTE — Telephone Encounter (Signed)
Pt called to let us know she couldn't complete her pre-opp appt today. Pt was sched for total knee replacement next Friday. Pt stated she is having pain under her right breast and its starting from her rib. Pt stated that she didn't want to cancel her surgery,she will call us Monday to let us know what her PCP said.

## 2017-11-21 ENCOUNTER — Other Ambulatory Visit (INDEPENDENT_AMBULATORY_CARE_PROVIDER_SITE_OTHER): Payer: Self-pay | Admitting: Physician Assistant

## 2017-11-23 NOTE — Patient Instructions (Signed)
Lauren [name]  11/23/2017   Your procedure is scheduled on: 12-02-17   Report to [facility] Main  Entrance  Report to Admitting at 6:45 AM   Call this number if you have problems the morning of surgery [phone]   Remember: Do not eat food or drink liquids :After Midnight.     Take these medicines the morning of surgery with A SIP OF WATER: Pantoprazole (Protonix). You may also bring and use your nasal spray as usual.                                You may not have any metal on your body including hair pins and              piercings  Do not wear jewelry, make-up, lotions, powders or perfumes, deodorant             Do not wear nail polish.  Do not shave  48 hours prior to surgery.                Do not bring valuables to the hospital. [place].  Contacts, dentures or bridgework may not be worn into surgery.  Leave suitcase in the car. After surgery it may be brought to your room.               Please read over the following fact sheets you were given: _____________________________________________________________________          How to Manage Your Diabetes Before and After Surgery  Why is it important to control my blood sugar before and after surgery? . Improving blood sugar levels before and after surgery helps healing and can limit problems. . A way of improving blood sugar control is eating a healthy diet by: o  Eating less sugar and carbohydrates o  Increasing activity/exercise o  Talking with your doctor about reaching your blood sugar goals . High blood sugars (greater than 180 mg/dL) can raise your risk of infections and slow your recovery, so you will need to focus on controlling your diabetes during the weeks before surgery. . Make sure that the doctor who takes care of your diabetes knows about your planned surgery including the date and location.  How do I manage my blood sugar before  surgery? . Check your blood sugar at least 4 times a day, starting 2 days before surgery, to make sure that the level is not too high or low. o Check your blood sugar the morning of your surgery when you wake up and every 2 hours until you get to the Short Stay unit. . If your blood sugar is less than 70 mg/dL, you will need to treat for low blood sugar: o Do not take insulin. o Treat a low blood sugar (less than 70 mg/dL) with  cup of clear juice (cranberry or apple), 4 glucose tablets, OR glucose gel. o Recheck blood sugar in 15 minutes after treatment (to make sure it is greater than 70 mg/dL). If your blood sugar is not greater than 70 mg/dL on recheck, call [phone] for further instructions. . Report your blood sugar to the short stay nurse when you get to Short Stay.  . If you are admitted to  the hospital after surgery: o Your blood sugar will be checked by the staff and you will probably be given insulin after surgery (instead of oral diabetes medicines) to make sure you have good blood sugar levels. o The goal for blood sugar control after surgery is 80-180 mg/dL.   WHAT DO I DO ABOUT MY DIABETES MEDICATION?  Marland Kitchen Do not take oral diabetes medicines (pills) the morning of surgery.  . THE BEFORE SURGERY, take your morning dose of Glimepiride (Amaryl), and your usual dose of Metformin        Reviewed and Endorsed by [facility] Patient Education Committee, August 2015  [facility] - Preparing for Surgery Before surgery, you can play an important role.  Because skin is not sterile, your skin needs to be as free of germs as possible.  You can reduce the number of germs on your skin by washing with CHG (chlorahexidine gluconate) soap before surgery.  CHG is an antiseptic cleaner which kills germs and bonds with the skin to continue killing germs even after washing. Please DO NOT use if you have an allergy to CHG or antibacterial soaps.  If your skin becomes reddened/irritated stop  using the CHG and inform your nurse when you arrive at Short Stay. Do not shave (including legs and underarms) for at least 48 hours prior to the first CHG shower.  You may shave your face/neck. Please follow these instructions carefully:  1.  Shower with CHG Soap the night before surgery and the  morning of Surgery.  2.  If you choose to wash your hair, wash your hair first as usual with your  normal  shampoo.  3.  After you shampoo, rinse your hair and body thoroughly to remove the  shampoo.                           4.  Use CHG as you would any other liquid soap.  You can apply chg directly  to the skin and wash                       Gently with a scrungie or clean washcloth.  5.  Apply the CHG Soap to your body ONLY FROM THE NECK DOWN.   Do not use on face/ open                           Wound or open sores. Avoid contact with eyes, ears mouth and genitals (private parts).                       Wash face,  Genitals (private parts) with your normal soap.             6.  Wash thoroughly, paying special attention to the area where your surgery  will be performed.  7.  Thoroughly rinse your body with warm water from the neck down.  8.  DO NOT shower/wash with your normal soap after using and rinsing off  the CHG Soap.                9.  Pat yourself dry with a clean towel.            10.  Wear clean pajamas.            11.  Place clean sheets on your bed the night of your first  shower and do not  sleep with pets. Day of Surgery : Do not apply any lotions/deodorants the morning of surgery.  Please wear clean clothes to the hospital/surgery center.  FAILURE TO FOLLOW THESE INSTRUCTIONS MAY RESULT IN THE CANCELLATION OF YOUR SURGERY PATIENT SIGNATURE_________________________________  NURSE SIGNATURE__________________________________  ________________________________________________________________________   [name]  An incentive spirometer is a tool that can help keep your  lungs clear and active. This tool measures how well you are filling your lungs with each breath. Taking long deep breaths may help reverse or decrease the chance of developing breathing (pulmonary) problems (especially infection) following:  A long period of time when you are unable to move or be active. BEFORE THE PROCEDURE   If the spirometer includes an indicator to show your best effort, your nurse or respiratory therapist will set it to a desired goal.  If possible, sit up straight or lean slightly forward. Try not to slouch.  Hold the incentive spirometer in an upright position. INSTRUCTIONS FOR USE  1. Sit on the edge of your bed if possible, or sit up as far as you can in bed or on a chair. 2. Hold the incentive spirometer in an upright position. 3. Breathe out normally. 4. Place the mouthpiece in your mouth and seal your lips tightly around it. 5. Breathe in slowly and as deeply as possible, raising the piston or the ball toward the top of the column. 6. Hold your breath for 3-5 seconds or for as long as possible. Allow the piston or ball to fall to the bottom of the column. 7. Remove the mouthpiece from your mouth and breathe out normally. 8. Rest for a few seconds and repeat Steps 1 through 7 at least 10 times every 1-2 hours when you are awake. Take your time and take a few normal breaths between deep breaths. 9. The spirometer may include an indicator to show your best effort. Use the indicator as a goal to work toward during each repetition. 10. After each set of 10 deep breaths, practice coughing to be sure your lungs are clear. If you have an incision (the cut made at the time of surgery), support your incision when coughing by placing a pillow or rolled up towels firmly against it. Once you are able to get out of bed, walk around indoors and cough well. You may stop using the incentive spirometer when instructed by your caregiver.  RISKS AND COMPLICATIONS  Take your time so  you do not get dizzy or light-headed.  If you are in pain, you may need to take or ask for pain medication before doing incentive spirometry. It is harder to take a deep breath if you are having pain. AFTER USE  Rest and breathe slowly and easily.  It can be helpful to keep track of a log of your progress. Your caregiver can provide you with a simple table to help with this. If you are using the spirometer at home, follow these instructions: [place] IF:   You are having difficultly using the spirometer.  You have trouble using the spirometer as often as instructed.  Your pain medication is not giving enough relief while using the spirometer.  You develop fever of 100.5 F (38.1 C) or higher. SEEK IMMEDIATE MEDICAL CARE IF:   You cough up bloody sputum that had not been present before.  You develop fever of 102 F (38.9 C) or greater.  You develop worsening pain at or near the incision site.  MAKE SURE YOU:   Understand these instructions.  Will watch your condition.  Will get help right away if you are not doing well or get worse. Document Released: 02/28/2007 Document Revised: 01/10/2012 Document Reviewed: 05/01/2007 [facility] Patient Information 2014 [place], [place].   ________________________________________________________________________

## 2017-11-23 NOTE — Progress Notes (Signed)
11-19-16 Copywriter, advertising) Stress Test

## 2017-11-25 ENCOUNTER — Encounter (HOSPITAL_COMMUNITY)
Admission: RE | Admit: 2017-11-25 | Discharge: 2017-11-25 | Disposition: A | Discharge status: Home / Self Care | Payer: PPO | Source: Ambulatory Visit | Attending: Orthopaedic Surgery | Admitting: Orthopaedic Surgery

## 2017-11-25 ENCOUNTER — Encounter (HOSPITAL_COMMUNITY): Payer: Self-pay

## 2017-11-25 ENCOUNTER — Other Ambulatory Visit: Payer: Self-pay

## 2017-11-25 DIAGNOSIS — Z01812 Encounter for preprocedural laboratory examination: Secondary | ICD-10-CM | POA: Diagnosis not present

## 2017-11-25 DIAGNOSIS — I10 Essential (primary) hypertension: Secondary | ICD-10-CM | POA: Insufficient documentation

## 2017-11-25 DIAGNOSIS — E119 Type 2 diabetes mellitus without complications: Secondary | ICD-10-CM | POA: Diagnosis not present

## 2017-11-25 DIAGNOSIS — Z01810 Encounter for preprocedural cardiovascular examination: Secondary | ICD-10-CM | POA: Diagnosis not present

## 2017-11-25 LAB — BASIC METABOLIC PANEL
Anion gap: 9 (ref 5–15)
BUN: 15 mg/dL (ref 6–20)
CALCIUM: 9.5 mg/dL (ref 8.9–10.3)
CHLORIDE: 103 mmol/L (ref 101–111)
CO2: 26 mmol/L (ref 22–32)
CREATININE: 0.83 mg/dL (ref 0.44–1.00)
Glucose, Bld: 166 mg/dL — ABNORMAL HIGH (ref 65–99)
Potassium: 4.5 mmol/L (ref 3.5–5.1)
SODIUM: 138 mmol/L (ref 135–145)

## 2017-11-25 LAB — CBC
HCT: 41.2 % (ref 36.0–46.0)
Hemoglobin: 13.2 g/dL (ref 12.0–15.0)
MCH: 27.5 pg (ref 26.0–34.0)
MCHC: 32 g/dL (ref 30.0–36.0)
MCV: 85.8 fL (ref 78.0–100.0)
PLATELETS: 278 10*3/uL (ref 150–400)
RBC: 4.8 MIL/uL (ref 3.87–5.11)
RDW: 14.6 % (ref 11.5–15.5)
WBC: 11 10*3/uL — ABNORMAL HIGH (ref 4.0–10.5)

## 2017-11-25 LAB — SURGICAL PCR SCREEN
MRSA, PCR: NEGATIVE
Staphylococcus aureus: NEGATIVE

## 2017-11-25 LAB — GLUCOSE, CAPILLARY: Glucose-Capillary: 226 mg/dL — ABNORMAL HIGH (ref 65–99)

## 2017-11-25 LAB — HEMOGLOBIN A1C
HEMOGLOBIN A1C: 7.7 % — AB (ref 4.8–5.6)
MEAN PLASMA GLUCOSE: 174.29 mg/dL

## 2017-12-01 MED ORDER — TRANEXAMIC ACID 1000 MG/10ML IV SOLN
1000.0000 mg | INTRAVENOUS | Status: DC
  Filled 2017-12-01: qty 10

## 2017-12-02 ENCOUNTER — Inpatient Hospital Stay (HOSPITAL_COMMUNITY): Payer: PPO

## 2017-12-02 ENCOUNTER — Encounter (HOSPITAL_COMMUNITY): Payer: Self-pay | Admitting: Certified Registered Nurse Anesthetist

## 2017-12-02 ENCOUNTER — Inpatient Hospital Stay (HOSPITAL_COMMUNITY): Payer: PPO | Admitting: Certified Registered Nurse Anesthetist

## 2017-12-02 ENCOUNTER — Inpatient Hospital Stay (HOSPITAL_COMMUNITY)
Admission: RE | Admit: 2017-12-02 | Discharge: 2017-12-05 | DRG: 470 | Disposition: A | Discharge status: Home Health | Payer: PPO | Source: Ambulatory Visit | Attending: Orthopaedic Surgery | Admitting: Orthopaedic Surgery

## 2017-12-02 ENCOUNTER — Other Ambulatory Visit: Payer: Self-pay

## 2017-12-02 ENCOUNTER — Encounter (HOSPITAL_COMMUNITY)
Admission: RE | Disposition: A | Discharge status: Home Health | Payer: Self-pay | Source: Ambulatory Visit | Attending: Orthopaedic Surgery

## 2017-12-02 DIAGNOSIS — K219 Gastro-esophageal reflux disease without esophagitis: Secondary | ICD-10-CM | POA: Diagnosis not present

## 2017-12-02 DIAGNOSIS — K449 Diaphragmatic hernia without obstruction or gangrene: Secondary | ICD-10-CM | POA: Diagnosis not present

## 2017-12-02 DIAGNOSIS — E1142 Type 2 diabetes mellitus with diabetic polyneuropathy: Secondary | ICD-10-CM | POA: Diagnosis present

## 2017-12-02 DIAGNOSIS — M1712 Unilateral primary osteoarthritis, left knee: Principal | ICD-10-CM | POA: Diagnosis present

## 2017-12-02 DIAGNOSIS — Z7984 Long term (current) use of oral hypoglycemic drugs: Secondary | ICD-10-CM

## 2017-12-02 DIAGNOSIS — G8918 Other acute postprocedural pain: Secondary | ICD-10-CM | POA: Diagnosis not present

## 2017-12-02 DIAGNOSIS — Z885 Allergy status to narcotic agent status: Secondary | ICD-10-CM | POA: Diagnosis not present

## 2017-12-02 DIAGNOSIS — Z87891 Personal history of nicotine dependence: Secondary | ICD-10-CM

## 2017-12-02 DIAGNOSIS — Z882 Allergy status to sulfonamides status: Secondary | ICD-10-CM

## 2017-12-02 DIAGNOSIS — Z91048 Other nonmedicinal substance allergy status: Secondary | ICD-10-CM

## 2017-12-02 DIAGNOSIS — Z7982 Long term (current) use of aspirin: Secondary | ICD-10-CM | POA: Diagnosis not present

## 2017-12-02 DIAGNOSIS — I10 Essential (primary) hypertension: Secondary | ICD-10-CM | POA: Diagnosis present

## 2017-12-02 DIAGNOSIS — Z880 Allergy status to penicillin: Secondary | ICD-10-CM

## 2017-12-02 DIAGNOSIS — Z96641 Presence of right artificial hip joint: Secondary | ICD-10-CM | POA: Diagnosis present

## 2017-12-02 DIAGNOSIS — M1611 Unilateral primary osteoarthritis, right hip: Secondary | ICD-10-CM | POA: Diagnosis not present

## 2017-12-02 DIAGNOSIS — Z79899 Other long term (current) drug therapy: Secondary | ICD-10-CM

## 2017-12-02 DIAGNOSIS — Z96652 Presence of left artificial knee joint: Secondary | ICD-10-CM

## 2017-12-02 DIAGNOSIS — M25562 Pain in left knee: Secondary | ICD-10-CM | POA: Diagnosis present

## 2017-12-02 DIAGNOSIS — Z881 Allergy status to other antibiotic agents status: Secondary | ICD-10-CM | POA: Diagnosis not present

## 2017-12-02 DIAGNOSIS — Z887 Allergy status to serum and vaccine status: Secondary | ICD-10-CM

## 2017-12-02 DIAGNOSIS — G4700 Insomnia, unspecified: Secondary | ICD-10-CM | POA: Diagnosis present

## 2017-12-02 DIAGNOSIS — Z471 Aftercare following joint replacement surgery: Secondary | ICD-10-CM | POA: Diagnosis not present

## 2017-12-02 HISTORY — PX: TOTAL KNEE ARTHROPLASTY: SHX125

## 2017-12-02 LAB — GLUCOSE, CAPILLARY
GLUCOSE-CAPILLARY: 121 mg/dL — AB (ref 65–99)
Glucose-Capillary: 130 mg/dL — ABNORMAL HIGH (ref 65–99)

## 2017-12-02 SURGERY — ARTHROPLASTY, KNEE, TOTAL
Anesthesia: Monitor Anesthesia Care | Site: Knee | Laterality: Left

## 2017-12-02 MED ORDER — PROPOFOL 500 MG/50ML IV EMUL
INTRAVENOUS | Status: DC | PRN
  Administered 2017-12-02: 125 ug/kg/min via INTRAVENOUS

## 2017-12-02 MED ORDER — PHENYLEPHRINE 40 MCG/ML (10ML) SYRINGE FOR IV PUSH (FOR BLOOD PRESSURE SUPPORT)
PREFILLED_SYRINGE | INTRAVENOUS | Status: AC
  Filled 2017-12-02: qty 10

## 2017-12-02 MED ORDER — RIVAROXABAN 10 MG PO TABS
10.0000 mg | ORAL_TABLET | Freq: Every day | ORAL | Status: DC
  Administered 2017-12-03: 10 mg via ORAL
  Filled 2017-12-02: qty 1

## 2017-12-02 MED ORDER — BUPIVACAINE LIPOSOME 1.3 % IJ SUSP
INTRAMUSCULAR | Status: DC | PRN
  Administered 2017-12-02: 20 mL

## 2017-12-02 MED ORDER — CLINDAMYCIN PHOSPHATE 900 MG/50ML IV SOLN: 900.0000 mg | INTRAVENOUS | Status: DC

## 2017-12-02 MED ORDER — PROPOFOL 10 MG/ML IV BOLUS
INTRAVENOUS | Status: AC
  Filled 2017-12-02: qty 40

## 2017-12-02 MED ORDER — SODIUM CHLORIDE 0.9 % IJ SOLN
INTRAMUSCULAR | Status: AC
Start: 2017-12-02 — End: ?
  Filled 2017-12-02: qty 20

## 2017-12-02 MED ORDER — PANTOPRAZOLE SODIUM 40 MG PO TBEC
40.0000 mg | DELAYED_RELEASE_TABLET | Freq: Two times a day (BID) | ORAL | Status: DC
  Administered 2017-12-02 – 2017-12-05 (×6): 40 mg via ORAL
  Filled 2017-12-02 (×6): qty 1

## 2017-12-02 MED ORDER — LACTATED RINGERS IV SOLN
INTRAVENOUS | Status: DC
Start: 2017-12-02 — End: 2017-12-02
  Administered 2017-12-02 (×3): via INTRAVENOUS

## 2017-12-02 MED ORDER — HYDROCODONE-ACETAMINOPHEN 5-325 MG PO TABS
1.0000 | ORAL_TABLET | ORAL | Status: DC | PRN
  Administered 2017-12-02: 2 via ORAL
  Administered 2017-12-02: 1 via ORAL
  Administered 2017-12-02: 2 via ORAL
  Administered 2017-12-02: 1 via ORAL
  Administered 2017-12-03 – 2017-12-04 (×6): 2 via ORAL
  Filled 2017-12-02 (×2): qty 2
  Filled 2017-12-02: qty 1
  Filled 2017-12-02 (×3): qty 2
  Filled 2017-12-02: qty 1
  Filled 2017-12-02 (×3): qty 2

## 2017-12-02 MED ORDER — POLYETHYLENE GLYCOL 3350 17 G PO PACK: 17.0000 g | PACK | Freq: Every day | ORAL | Status: DC | PRN

## 2017-12-02 MED ORDER — CHOLESTYRAMINE 4 GM/DOSE PO POWD: 4.0000 g | Freq: Every day | ORAL | Status: DC

## 2017-12-02 MED ORDER — ONDANSETRON HCL 4 MG PO TABS
4.0000 mg | ORAL_TABLET | Freq: Four times a day (QID) | ORAL | Status: DC | PRN
  Administered 2017-12-04: 4 mg via ORAL
  Filled 2017-12-02: qty 1

## 2017-12-02 MED ORDER — PROPOFOL 10 MG/ML IV BOLUS
INTRAVENOUS | Status: AC
  Filled 2017-12-02: qty 20

## 2017-12-02 MED ORDER — B COMPLEX PO TABS: 1.0000 | ORAL_TABLET | Freq: Every day | ORAL | Status: DC

## 2017-12-02 MED ORDER — METHOCARBAMOL 500 MG PO TABS
500.0000 mg | ORAL_TABLET | Freq: Four times a day (QID) | ORAL | Status: DC | PRN
  Administered 2017-12-03 – 2017-12-05 (×6): 500 mg via ORAL
  Filled 2017-12-02 (×6): qty 1

## 2017-12-02 MED ORDER — GABAPENTIN 300 MG PO CAPS
600.0000 mg | ORAL_CAPSULE | Freq: Every day | ORAL | Status: DC
  Administered 2017-12-02 – 2017-12-04 (×3): 600 mg via ORAL
  Filled 2017-12-02 (×3): qty 2

## 2017-12-02 MED ORDER — PROPOFOL 10 MG/ML IV BOLUS
INTRAVENOUS | Status: DC | PRN
  Administered 2017-12-02: 20 mg via INTRAVENOUS
  Administered 2017-12-02: 30 mg via INTRAVENOUS

## 2017-12-02 MED ORDER — HYDROMORPHONE HCL 1 MG/ML IJ SOLN
1.0000 mg | INTRAMUSCULAR | Status: DC | PRN
  Administered 2017-12-02: 0.5 mg via INTRAVENOUS
  Administered 2017-12-02 – 2017-12-04 (×7): 1 mg via INTRAVENOUS
  Filled 2017-12-02 (×8): qty 1

## 2017-12-02 MED ORDER — ONDANSETRON HCL 4 MG/2ML IJ SOLN
INTRAMUSCULAR | Status: AC
  Filled 2017-12-02: qty 2

## 2017-12-02 MED ORDER — CLINDAMYCIN PHOSPHATE 600 MG/50ML IV SOLN
600.0000 mg | Freq: Four times a day (QID) | INTRAVENOUS | Status: AC
  Administered 2017-12-02 (×2): 600 mg via INTRAVENOUS
  Filled 2017-12-02 (×2): qty 50

## 2017-12-02 MED ORDER — BUPIVACAINE IN DEXTROSE 0.75-8.25 % IT SOLN
INTRATHECAL | Status: DC | PRN
  Administered 2017-12-02: 2 mL via INTRATHECAL

## 2017-12-02 MED ORDER — SODIUM CHLORIDE 0.9 % IJ SOLN
INTRAMUSCULAR | Status: DC | PRN
  Administered 2017-12-02: 20 mL

## 2017-12-02 MED ORDER — MIDAZOLAM HCL 2 MG/2ML IJ SOLN
1.0000 mg | INTRAMUSCULAR | Status: DC
Start: 2017-12-02 — End: 2017-12-02
  Administered 2017-12-02: 1 mg via INTRAVENOUS
  Filled 2017-12-02: qty 2

## 2017-12-02 MED ORDER — ONDANSETRON HCL 4 MG/2ML IJ SOLN: 4.0000 mg | Freq: Four times a day (QID) | INTRAMUSCULAR | Status: DC | PRN

## 2017-12-02 MED ORDER — CLINDAMYCIN PHOSPHATE 900 MG/50ML IV SOLN
900.0000 mg | INTRAVENOUS | Status: AC
  Administered 2017-12-02: 900 mg via INTRAVENOUS

## 2017-12-02 MED ORDER — FUROSEMIDE 20 MG PO TABS
20.0000 mg | ORAL_TABLET | Freq: Every day | ORAL | Status: DC
  Administered 2017-12-03 – 2017-12-05 (×3): 20 mg via ORAL
  Filled 2017-12-02 (×3): qty 1

## 2017-12-02 MED ORDER — STERILE WATER FOR IRRIGATION IR SOLN
Status: DC | PRN
  Administered 2017-12-02: 2000 mL

## 2017-12-02 MED ORDER — FENTANYL CITRATE (PF) 100 MCG/2ML IJ SOLN
50.0000 ug | INTRAMUSCULAR | Status: DC
Start: 2017-12-02 — End: 2017-12-02
  Administered 2017-12-02: 50 ug via INTRAVENOUS
  Filled 2017-12-02: qty 2

## 2017-12-02 MED ORDER — B COMPLEX-C PO TABS
1.0000 | ORAL_TABLET | Freq: Every day | ORAL | Status: DC
  Administered 2017-12-03 – 2017-12-04 (×2): 1 via ORAL
  Filled 2017-12-02 (×3): qty 1

## 2017-12-02 MED ORDER — ROPIVACAINE HCL 7.5 MG/ML IJ SOLN
INTRAMUSCULAR | Status: DC | PRN
  Administered 2017-12-02: 20 mL via PERINEURAL

## 2017-12-02 MED ORDER — CHLORHEXIDINE GLUCONATE 4 % EX LIQD: 60.0000 mL | Freq: Once | CUTANEOUS | Status: DC

## 2017-12-02 MED ORDER — GLIMEPIRIDE 4 MG PO TABS
4.0000 mg | ORAL_TABLET | Freq: Every day | ORAL | Status: DC
  Administered 2017-12-03 – 2017-12-05 (×3): 4 mg via ORAL
  Filled 2017-12-02 (×3): qty 1

## 2017-12-02 MED ORDER — PHENOL 1.4 % MT LIQD: 1.0000 | OROMUCOSAL | Status: DC | PRN

## 2017-12-02 MED ORDER — DOCUSATE SODIUM 100 MG PO CAPS
100.0000 mg | ORAL_CAPSULE | Freq: Two times a day (BID) | ORAL | Status: DC
  Administered 2017-12-02 – 2017-12-05 (×6): 100 mg via ORAL
  Filled 2017-12-02 (×6): qty 1

## 2017-12-02 MED ORDER — FENTANYL CITRATE (PF) 100 MCG/2ML IJ SOLN: 25.0000 ug | INTRAMUSCULAR | Status: DC | PRN

## 2017-12-02 MED ORDER — DEXAMETHASONE SODIUM PHOSPHATE 10 MG/ML IJ SOLN
INTRAMUSCULAR | Status: DC | PRN
  Administered 2017-12-02: 10 mg via INTRAVENOUS

## 2017-12-02 MED ORDER — METOCLOPRAMIDE HCL 5 MG PO TABS: 5.0000 mg | ORAL_TABLET | Freq: Three times a day (TID) | ORAL | Status: DC | PRN

## 2017-12-02 MED ORDER — ACETAMINOPHEN 325 MG PO TABS: 650.0000 mg | ORAL_TABLET | ORAL | Status: DC | PRN

## 2017-12-02 MED ORDER — SODIUM CHLORIDE 0.9 % IV SOLN
INTRAVENOUS | Status: DC
  Administered 2017-12-02: 14:00:00 via INTRAVENOUS

## 2017-12-02 MED ORDER — DIPHENHYDRAMINE HCL 12.5 MG/5ML PO ELIX: 12.5000 mg | ORAL_SOLUTION | ORAL | Status: DC | PRN

## 2017-12-02 MED ORDER — CHOLESTYRAMINE 4 G PO PACK
4.0000 g | PACK | ORAL | Status: DC
  Administered 2017-12-04: 4 g via ORAL
  Filled 2017-12-02 (×3): qty 1

## 2017-12-02 MED ORDER — SODIUM CHLORIDE 0.9 % IR SOLN
Status: DC | PRN
  Administered 2017-12-02: 1000 mL

## 2017-12-02 MED ORDER — DEXTROSE 5 % IV SOLN
500.0000 mg | Freq: Four times a day (QID) | INTRAVENOUS | Status: DC | PRN
  Administered 2017-12-02: 500 mg via INTRAVENOUS
  Filled 2017-12-02: qty 5
  Filled 2017-12-02: qty 550

## 2017-12-02 MED ORDER — PROPOFOL 10 MG/ML IV BOLUS
INTRAVENOUS | Status: AC
  Filled 2017-12-02: qty 60

## 2017-12-02 MED ORDER — METOCLOPRAMIDE HCL 5 MG/ML IJ SOLN: 5.0000 mg | Freq: Three times a day (TID) | INTRAMUSCULAR | Status: DC | PRN

## 2017-12-02 MED ORDER — QUINAPRIL HCL 10 MG PO TABS: 40.0000 mg | ORAL_TABLET | Freq: Every day | ORAL | Status: DC

## 2017-12-02 MED ORDER — BALSALAZIDE DISODIUM 750 MG PO CAPS
2250.0000 mg | ORAL_CAPSULE | Freq: Two times a day (BID) | ORAL | Status: DC
  Administered 2017-12-02 – 2017-12-04 (×4): 2250 mg via ORAL
  Filled 2017-12-02 (×10): qty 3

## 2017-12-02 MED ORDER — ACETAMINOPHEN 650 MG RE SUPP: 650.0000 mg | RECTAL | Status: DC | PRN

## 2017-12-02 MED ORDER — TRANEXAMIC ACID 1000 MG/10ML IV SOLN
1000.0000 mg | INTRAVENOUS | Status: AC
  Administered 2017-12-02: 1000 mg via INTRAVENOUS
  Filled 2017-12-02: qty 10

## 2017-12-02 MED ORDER — OXYCODONE HCL 5 MG PO TABS: 10.0000 mg | ORAL_TABLET | ORAL | Status: DC | PRN

## 2017-12-02 MED ORDER — ALUM & MAG HYDROXIDE-SIMETH 200-200-20 MG/5ML PO SUSP: 30.0000 mL | ORAL | Status: DC | PRN

## 2017-12-02 MED ORDER — BUPIVACAINE LIPOSOME 1.3 % IJ SUSP
20.0000 mL | Freq: Once | INTRAMUSCULAR | Status: DC
  Filled 2017-12-02: qty 20

## 2017-12-02 MED ORDER — CLINDAMYCIN PHOSPHATE 900 MG/50ML IV SOLN
INTRAVENOUS | Status: AC
  Filled 2017-12-02: qty 50

## 2017-12-02 MED ORDER — ONDANSETRON HCL 4 MG/2ML IJ SOLN
INTRAMUSCULAR | Status: DC | PRN
  Administered 2017-12-02: 4 mg via INTRAVENOUS

## 2017-12-02 MED ORDER — DEXAMETHASONE SODIUM PHOSPHATE 10 MG/ML IJ SOLN
INTRAMUSCULAR | Status: AC
  Filled 2017-12-02: qty 1

## 2017-12-02 MED ORDER — AMLODIPINE BESYLATE 5 MG PO TABS
5.0000 mg | ORAL_TABLET | Freq: Every day | ORAL | Status: DC
  Administered 2017-12-02 – 2017-12-04 (×3): 5 mg via ORAL
  Filled 2017-12-02 (×3): qty 1

## 2017-12-02 MED ORDER — VITAMIN D 1000 UNITS PO TABS
4000.0000 [IU] | ORAL_TABLET | Freq: Every day | ORAL | Status: DC
  Administered 2017-12-03 – 2017-12-05 (×3): 4000 [IU] via ORAL
  Filled 2017-12-02 (×3): qty 4

## 2017-12-02 MED ORDER — HYDROCODONE-ACETAMINOPHEN 7.5-325 MG PO TABS: 1.0000 | ORAL_TABLET | Freq: Once | ORAL | Status: DC | PRN

## 2017-12-02 MED ORDER — METFORMIN HCL ER 500 MG PO TB24
1000.0000 mg | ORAL_TABLET | Freq: Two times a day (BID) | ORAL | Status: DC
  Administered 2017-12-02 – 2017-12-05 (×7): 1000 mg via ORAL
  Filled 2017-12-02 (×6): qty 2

## 2017-12-02 MED ORDER — MENTHOL 3 MG MT LOZG: 1.0000 | LOZENGE | OROMUCOSAL | Status: DC | PRN

## 2017-12-02 MED ORDER — LISINOPRIL 20 MG PO TABS
40.0000 mg | ORAL_TABLET | Freq: Every day | ORAL | Status: DC
  Administered 2017-12-03 – 2017-12-05 (×3): 40 mg via ORAL
  Filled 2017-12-02 (×3): qty 2

## 2017-12-02 MED ORDER — PHENYLEPHRINE 40 MCG/ML (10ML) SYRINGE FOR IV PUSH (FOR BLOOD PRESSURE SUPPORT)
PREFILLED_SYRINGE | INTRAVENOUS | Status: DC | PRN
  Administered 2017-12-02: 80 ug via INTRAVENOUS

## 2017-12-02 SURGICAL SUPPLY — 45 items
BAG ZIPLOCK 12X15 (MISCELLANEOUS) ×2 IMPLANT
BANDAGE ACE 6X5 VEL STRL LF (GAUZE/BANDAGES/DRESSINGS) ×2 IMPLANT
BENZOIN TINCTURE PRP APPL 2/3 (GAUZE/BANDAGES/DRESSINGS) ×2 IMPLANT
BLADE SAG 18X100X1.27 (BLADE) ×2 IMPLANT
BOWL SMART MIX CTS (DISPOSABLE) IMPLANT
CAPT KNEE TOTAL 3 ×2 IMPLANT
CEMENT BONE SIMPLEX SPEEDSET (Cement) ×4 IMPLANT
COVER SURGICAL LIGHT HANDLE (MISCELLANEOUS) ×2 IMPLANT
CUFF TOURN SGL QUICK 34 (TOURNIQUET CUFF) ×1
CUFF TRNQT CYL 34X4X40X1 (TOURNIQUET CUFF) ×1 IMPLANT
DRAPE U-SHAPE 47X51 STRL (DRAPES) ×2 IMPLANT
DRSG AQUACEL AG ADV 3.5X10 (GAUZE/BANDAGES/DRESSINGS) IMPLANT
DRSG PAD ABDOMINAL 8X10 ST (GAUZE/BANDAGES/DRESSINGS) ×2 IMPLANT
DURAPREP 26ML APPLICATOR (WOUND CARE) ×2 IMPLANT
ELECT REM PT RETURN 15FT ADLT (MISCELLANEOUS) ×2 IMPLANT
GAUZE SPONGE 4X4 12PLY STRL (GAUZE/BANDAGES/DRESSINGS) ×2 IMPLANT
GAUZE XEROFORM 1X8 LF (GAUZE/BANDAGES/DRESSINGS) IMPLANT
GLOVE BIO SURGEON STRL SZ7.5 (GLOVE) ×2 IMPLANT
GLOVE BIOGEL PI IND STRL 8 (GLOVE) ×2 IMPLANT
GLOVE BIOGEL PI INDICATOR 8 (GLOVE) ×2
GLOVE ECLIPSE 8.0 STRL XLNG CF (GLOVE) ×2 IMPLANT
GOWN STRL REUS W/TWL XL LVL3 (GOWN DISPOSABLE) ×4 IMPLANT
HANDPIECE INTERPULSE COAX TIP (DISPOSABLE) ×1
IMMOBILIZER KNEE 20 (SOFTGOODS) ×2
IMMOBILIZER KNEE 20 THIGH 36 (SOFTGOODS) ×1 IMPLANT
NS IRRIG 1000ML POUR BTL (IV SOLUTION) ×2 IMPLANT
PACK TOTAL KNEE CUSTOM (KITS) ×2 IMPLANT
PADDING CAST COTTON 6X4 STRL (CAST SUPPLIES) ×4 IMPLANT
POSITIONER SURGICAL ARM (MISCELLANEOUS) ×2 IMPLANT
SET HNDPC FAN SPRY TIP SCT (DISPOSABLE) ×1 IMPLANT
SET PAD KNEE POSITIONER (MISCELLANEOUS) ×2 IMPLANT
STAPLER VISISTAT 35W (STAPLE) IMPLANT
STRIP CLOSURE SKIN 1/2X4 (GAUZE/BANDAGES/DRESSINGS) ×4 IMPLANT
SUT MNCRL AB 4-0 PS2 18 (SUTURE) ×2 IMPLANT
SUT VIC AB 0 CT1 27 (SUTURE) ×2
SUT VIC AB 0 CT1 27XBRD ANTBC (SUTURE) ×2 IMPLANT
SUT VIC AB 1 CT1 27 (SUTURE) ×3
SUT VIC AB 1 CT1 27XBRD ANTBC (SUTURE) ×3 IMPLANT
SUT VIC AB 2-0 CT1 27 (SUTURE) ×2
SUT VIC AB 2-0 CT1 TAPERPNT 27 (SUTURE) ×2 IMPLANT
TRAY FOLEY CATH 14FRSI W/METER (CATHETERS) ×2 IMPLANT
TRAY FOLEY W/METER SILVER 16FR (SET/KITS/TRAYS/PACK) IMPLANT
WATER STERILE IRR 1000ML POUR (IV SOLUTION) ×4 IMPLANT
WRAP KNEE MAXI GEL POST OP (GAUZE/BANDAGES/DRESSINGS) ×2 IMPLANT
YANKAUER SUCT BULB TIP 10FT TU (MISCELLANEOUS) ×2 IMPLANT

## 2017-12-02 NOTE — Anesthesia Procedure Notes (Signed)
Anesthesia [facility] Block: Adductor canal block   Pre-Anesthetic Checklist: ,, timeout performed, Correct Patient, Correct Site, Correct Laterality, Correct Procedure, Correct Position, site marked, Risks and benefits discussed,  Surgical consent,  Pre-op evaluation,  At surgeon's request and post-op pain management  Laterality: Lower and Left  Prep: chloraprep       Needles:  Injection technique: Single-shot  Needle Type: Echogenic Stimulator Needle          Additional Needles:   Procedures:,,,, ultrasound used (permanent image in chart),,,,  Narrative:  Start time: 12/02/2017 8:16 AM End time: 12/02/2017 8:20 AM Injection made incrementally with aspirations every 5 mL.  Performed by: Personally  Anesthesiologist: Oleta Mouse, MD  Additional Notes: H+P and labs reviewed, risks and benefits discussed with patient, procedure tolerated well without complications

## 2017-12-02 NOTE — Discharge Instructions (Addendum)

## 2017-12-02 NOTE — Evaluation (Signed)
Physical Therapy Evaluation Patient Details Name: Lauren [name] MRN: [id number] DOB: [birth year] Today's Date: 12/02/2017   History of Present Illness  Pt s/p L TKR and with hx of R THR and DM  Clinical Impression  Pt s/p L TKR and presents with decreased L LE strength/ROM and post op pain limiting functional mobility.  Pt should progress to dc home with family assist.    Follow Up Recommendations Home health PT;DC plan and follow up therapy as arranged by surgeon    Equipment Recommendations  None recommended by PT    Recommendations for Other Services OT consult     Precautions / Restrictions Precautions Precautions: Fall Restrictions Weight Bearing Restrictions: No Other Position/Activity Restrictions: WBAT      Mobility  Bed Mobility Overal bed mobility: Needs Assistance Bed Mobility: Supine to Sit     Supine to sit: Min assist;Mod assist     General bed mobility comments: cues for sequence and use of R LE to self assist  Transfers Overall transfer level: Needs assistance Equipment used: Rolling walker (2 wheeled) Transfers: Sit to/from Stand Sit to Stand: Min assist;Mod assist;From elevated surface         General transfer comment: cues for LE management and use of UEs to self assist  Ambulation/Gait Ambulation/Gait assistance: Min assist Ambulation Distance (Feet): 28 Feet Assistive device: Rolling walker (2 wheeled) Gait Pattern/deviations: Step-to pattern;Decreased step length - right;Decreased step length - left;Shuffle;Trunk flexed Gait velocity: decr Gait velocity interpretation: Below normal speed for age/gender General Gait Details: cues for posture, sequence and position from [employer]            Wheelchair Mobility    Modified Rankin (Stroke Patients Only)       Balance                                             Pertinent Vitals/Pain Pain Assessment: 0-10 Pain Score: 5  Pain Location: L knee Pain  Descriptors / Indicators: Aching;Sore Pain Intervention(s): Limited activity within patient's tolerance;Monitored during session;Premedicated before session;Ice applied    Home Living Family/patient expects to be discharged to:: Private residence Living Arrangements: Spouse/significant other Available Help at Discharge: Family Type of Home: House Home Access: Stairs to enter Entrance Stairs-Rails: Psychiatric nurse of Steps: 5 Home Layout: Two level Home Equipment: Environmental consultant - 2 wheels;Cane - single point;Crutches      Prior Function Level of Independence: Independent;Independent with assistive device(s)         Comments: used cane as needed     Hand Dominance        Extremity/Trunk Assessment   Upper Extremity Assessment Upper Extremity Assessment: Overall WFL for tasks assessed    Lower Extremity Assessment Lower Extremity Assessment: LLE deficits/detail    Cervical / Trunk Assessment Cervical / Trunk Assessment: Normal  Communication   Communication: No difficulties  Cognition Arousal/Alertness: Awake/alert Behavior During Therapy: WFL for tasks assessed/performed Overall Cognitive Status: Within Functional Limits for tasks assessed                                        General Comments      Exercises Total Joint Exercises Ankle Circles/Pumps: AROM;Both;20 reps;Supine   Assessment/Plan    PT Assessment Patient needs continued PT services  PT Problem List Decreased strength;Decreased range of motion;Decreased activity tolerance;Decreased mobility;Decreased knowledge of use of DME;Pain;Obesity       PT Treatment Interventions DME instruction;Gait training;Stair training;Functional mobility training;Therapeutic activities;Therapeutic exercise;Patient/family education    PT Goals (Current goals can be found in the Care Plan section)  Acute Rehab PT Goals Patient Stated Goal: Regain IND and work in the yard this spring PT  Goal Formulation: With patient Time For Goal Achievement: 12/09/17 Potential to Achieve Goals: Good    Frequency 7X/week   Barriers to discharge        Co-evaluation               AM-PAC PT "6 Clicks" Daily Activity  Outcome Measure Difficulty turning over in bed (including adjusting bedclothes, sheets and blankets)?: Unable Difficulty moving from lying on back to sitting on the side of the bed? : Unable Difficulty sitting down on and standing up from a chair with arms (e.g., wheelchair, bedside commode, etc,.)?: Unable Help needed moving to and from a bed to chair (including a wheelchair)?: A Little Help needed walking in hospital room?: A Little Help needed climbing 3-5 steps with a railing? : A Lot 6 Click Score: 11    End of Session Equipment Utilized During Treatment: Gait belt;Left knee immobilizer Activity Tolerance: Patient tolerated treatment well Patient left: in chair;with call bell/phone within reach;with chair alarm set Nurse Communication: Mobility status PT Visit Diagnosis: Difficulty in walking, not elsewhere classified (R26.2)    Time: 4680-3212 PT Time Calculation (min) (ACUTE ONLY): 29 min   Charges:   PT Evaluation $PT Eval Low Complexity: 1 Low PT Treatments $Gait Training: 8-22 mins   PT G Codes:        Pg [phone]   Ymani Porcher 12/02/2017, 5:01 PM

## 2017-12-02 NOTE — Plan of Care (Signed)
Plan of care dicussed

## 2017-12-02 NOTE — Anesthesia Procedure Notes (Signed)
Spinal  Patient location during procedure: OR Start time: 12/02/2017 9:04 AM Staffing Anesthesiologist: Oleta Mouse, MD Resident/CRNA: [place] Dequincy Born C, CRNA Performed: resident/CRNA  Preanesthetic Checklist Completed: patient identified, site marked, surgical consent, pre-op evaluation, timeout performed, IV checked, risks and benefits discussed and monitors and equipment checked Spinal Block Patient position: sitting Prep: DuraPrep Patient monitoring: heart rate, continuous pulse ox and blood pressure Approach: midline Location: L3-4 Injection technique: single-shot Needle Needle type: Pencan  Needle gauge: 24 G Needle length: 9 cm Assessment Sensory level: T6 Additional Notes Expiration date of kit checked and confirmed. Patient tolerated procedure well, without complications.

## 2017-12-02 NOTE — Transfer of Care (Signed)
Immediate Anesthesia Transfer of Care Note  Patient: Lauren [name]  Procedure(s) Performed: LEFT TOTAL KNEE ARTHROPLASTY (Left Knee)  Patient Location: PACU  Anesthesia Type:Spinal  Level of Consciousness: awake, alert  and oriented  Airway & Oxygen Therapy: Patient Spontanous Breathing and Patient connected to face mask oxygen  Post-op Assessment: Report given to RN, Post -op Vital signs reviewed and stable and Post -op Vital signs reviewed and unstable, Anesthesiologist notified  Post vital signs: Reviewed and stable  Last Vitals:  Vitals:   12/02/17 0849 12/02/17 0850  BP: 137/78   Pulse: 75 78  Resp: 14 17  Temp:    SpO2: 99% 99%    Last Pain:  Vitals:   12/02/17 0742  TempSrc:   PainSc: 4       Patients Stated Pain Goal: 4 (78/58/85 0277)  Complications: No apparent anesthesia complications

## 2017-12-02 NOTE — H&P (Signed)
TOTAL KNEE ADMISSION H&P  Patient is being admitted for left total knee arthroplasty.  Subjective:  Chief Complaint:left knee pain.  HPI: Lauren [name], 72 y.o. female, has a history of pain and functional disability in the left knee due to arthritis and has failed non-surgical conservative treatments for greater than 12 weeks to includeNSAID's and/or analgesics, corticosteriod injections, viscosupplementation injections, flexibility and strengthening excercises, supervised PT with diminished ADL's post treatment, use of assistive devices, weight reduction as appropriate and activity modification.  Onset of symptoms was gradual, starting 3 years ago with gradually worsening course since that time. The patient noted no past surgery on the left knee(s).  Patient currently rates pain in the left knee(s) at 9 out of 10 with activity. Patient has night pain, worsening of pain with activity and weight bearing, pain that interferes with activities of daily living, pain with passive range of motion, crepitus and joint swelling.  Patient has evidence of subchondral sclerosis, periarticular osteophytes and joint space narrowing by imaging studies. There is no active infection.  Patient Active Problem List   Diagnosis Date Noted  . Trochanteric bursitis, right hip 05/10/2017  . Primary osteoarthritis of left knee 03/09/2017  . Unilateral primary osteoarthritis, right hip 11/26/2016  . Status post total replacement of right hip 11/26/2016  . Hiatal hernia 04/13/2013  . Type II or unspecified type diabetes mellitus without mention of complication, uncontrolled 01/26/2013  . Umbilical hernia 74/25/9563  . Hypertension    Past Medical History:  Diagnosis Date  . Arthritis    Back, Hip- right  . Diabetes mellitus without complication ([place])    Type II  . Endometriosis   . Family history of adverse reaction to anesthesia    Father - N/V - blood pressure; daughter N/V  . GERD (gastroesophageal reflux  disease)   . History of kidney stones   . History of ulcerative colitis   . Hypertension   . Insomnia   . 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 Neuropathy   . Pneumonia    1983, 2003  . Umbilical hernia   . Vitamin D deficiency     Past Surgical History:  Procedure Laterality Date  . BREAST BIOPSY  08-13-2009  DR TSUEI   EXCISION LEFT NIPPLE DUCT  . CHOLECYSTECTOMY  1992  . COLON RESECTION  1986   ULCERATIVE COLITIS  . COLON SURGERY    . ESOPHAGEAL MANOMETRY N/A 06/11/2013   Procedure: ESOPHAGEAL MANOMETRY (EM);  Surgeon: Jeryl Columbia, MD;  Location: [facility] ENDOSCOPY;  Service: Endoscopy;  Laterality: N/A;  . ESOPHAGOGASTRODUODENOSCOPY (EGD) WITH PROPOFOL N/A 05/31/2016   Procedure: ESOPHAGOGASTRODUODENOSCOPY (EGD) WITH PROPOFOL;  Surgeon: Clarene Essex, MD;  Location: [facility] ENDOSCOPY;  Service: Endoscopy;  Laterality: N/A;  . EXCISION RIGHT BREAST MASS   10-20-2005  DR Marylene Buerger  . kidney stone removed  2/11  . KNEE ARTHROSCOPY Left    MCL  . LEFT THUMB CARPOMETACARPAL JOINT SUSPENSIONPLASTY  04-06-2006  DR Burney Gauze  . LEFT WRIST ARTHROSCOPY W/ DEBRIDEMENT AND REMOVAL CYST  03-26-2004  DR Burney Gauze  . MYOMECTOMY    . RIGHT COLECTOMY    . RIGHT URETEROSCOPIC STONE EXTRACTION  12-11-2009  DR Irine Seal  . TENDON RELEASE    . TOTAL HIP ARTHROPLASTY Right 11/26/2016   Procedure: RIGHT TOTAL HIP ARTHROPLASTY ANTERIOR APPROACH;  Surgeon: Mcarthur Rossetti, MD;  Location: [facility] ORS;  Service: Orthopedics;  Laterality: Right;  . WRIST SURGERY     both  Current Facility-Administered Medications  Medication Dose Route Frequency Provider Last Rate Last Dose  . tranexamic acid (CYKLOKAPRON) 1,000 mg in sodium chloride 0.9 % 100 mL IVPB  1,000 mg Intravenous To OR Mcarthur Rossetti, MD       Allergies  Allergen Reactions  . Ciprofloxacin Hcl Hives  . Percocet [Oxycodone-Acetaminophen] Other (See Comments)    RESPIRATORY DISTRESS  . Penicillins Hives and Other (See Comments)    Has  patient had a PCN reaction causing immediate rash, facial/tongue/throat swelling, SOB or lightheadedness with hypotension: no Has patient had a PCN reaction causing severe rash involving mucus membranes or skin necrosis: no Has patient had a PCN reaction that required hospitalization no Has patient had a PCN reaction occurring within the last 10 years: no If all of the above answers are "NO", then may proceed with Cephalosporin use.  . Sulfa Antibiotics Hives  . Tape Other (See Comments)    Adhesives - Causes Blisters   No band aids, and plastic tape   . Tetanus Toxoids Hives and Rash    Social History   Tobacco Use  . Smoking status: Former Smoker    Packs/day: 1.00    Years: 10.00    Pack years: 10.00    Types: Cigarettes    Last attempt to quit: 09/22/1982    Years since quitting: 35.2  . Smokeless tobacco: Never Used  . Tobacco comment: quit 1983  Substance Use Topics  . Alcohol use: No    Family History  Problem Relation Age of Onset  . Hypertension Father   . Heart disease Father   . Diabetes Father   . Cancer Father        colon, prostate, and kidney  . Hypertension Mother   . Parkinson's disease Mother   . Heart disease Maternal Grandmother   . Diabetes Maternal Grandmother   . Heart disease Maternal Grandfather   . Diabetes Maternal Grandfather   . Cancer Maternal Grandfather        pt unaware of what kind  . Stroke Paternal Grandmother   . Stroke Paternal Grandfather      Review of Systems  Musculoskeletal: Positive for joint pain.  All other systems reviewed and are negative.   Objective:  Physical Exam  Constitutional: She is oriented to person, place, and time. She appears well-developed and well-nourished.  HENT:  Head: Normocephalic and atraumatic.  Eyes: EOM are normal. Pupils are equal, round, and reactive to light.  Neck: Normal range of motion. Neck supple.  Cardiovascular: Normal rate and regular rhythm.  Respiratory: Effort normal and  breath sounds normal.  GI: Soft. Bowel sounds are normal.  Musculoskeletal:       Left knee: She exhibits decreased range of motion, effusion and abnormal alignment. Tenderness found. Medial joint line and lateral joint line tenderness noted.  Neurological: She is alert and oriented to person, place, and time.  Skin: Skin is warm.  Psychiatric: She has a normal mood and affect.    Vital signs in last 24 hours: Temp:  [97.9 F (36.6 C)] 97.9 F (36.6 C) (02/01 0708) Pulse Rate:  [93] 93 (02/01 0708) Resp:  [18] 18 (02/01 0708) BP: (142)/(104) 142/104 (02/01 0708) SpO2:  [98 %] 98 % (02/01 0708)  Labs:   Estimated body mass index is 38.79 kg/m as calculated from the following:   Height as of 11/25/17: 5' 4.5" (1.638 m).   Weight as of 11/25/17: 229 lb 8 oz (104.1 kg).   Imaging Review  Plain radiographs demonstrate severe degenerative joint disease of the left knee(s). The overall alignment ismild varus. The bone quality appears to be excellent for age and reported activity level.  Assessment/Plan:  End stage arthritis, left knee   The patient history, physical examination, clinical judgment of the provider and imaging studies are consistent with end stage degenerative joint disease of the left knee(s) and total knee arthroplasty is deemed medically necessary. The treatment options including medical management, injection therapy arthroscopy and arthroplasty were discussed at length. The risks and benefits of total knee arthroplasty were presented and reviewed. The risks due to aseptic loosening, infection, stiffness, patella tracking problems, thromboembolic complications and other imponderables were discussed. The patient acknowledged the explanation, agreed to proceed with the plan and consent was signed. Patient is being admitted for inpatient treatment for surgery, pain control, PT, OT, prophylactic antibiotics, VTE prophylaxis, progressive ambulation and ADL's and discharge  planning. The patient is planning to be discharged home with home health services

## 2017-12-02 NOTE — Anesthesia Preprocedure Evaluation (Signed)
Anesthesia Evaluation  Patient identified by MRN, date of birth, ID band Patient awake    Reviewed: Allergy & Precautions, NPO status , Patient's Chart, lab work & pertinent test results  History of Anesthesia Complications Negative for: history of anesthetic complications  Airway Mallampati: II  TM Distance: >3 FB Neck ROM: Full    Dental  (+) Teeth Intact   Pulmonary neg shortness of breath, neg COPD, neg recent URI, former smoker,    Pulmonary exam normal breath sounds clear to auscultation       Cardiovascular hypertension, Pt. on medications (-) angina(-) Past MI and (-) CHF Normal cardiovascular exam Rhythm:Regular Rate:Normal     Neuro/Psych  Neuromuscular disease negative psych ROS   GI/Hepatic Neg liver ROS, hiatal hernia, GERD  Medicated and Controlled,  Endo/Other  diabetes, Type 2  Renal/GU negative Renal ROS     Musculoskeletal  (+) Arthritis ,   Abdominal   Peds  Hematology negative hematology ROS (+)   Anesthesia Other Findings   Reproductive/Obstetrics                             Anesthesia Physical Anesthesia Plan  ASA: III  Anesthesia Plan: MAC, Spinal and Regional   Post-op Pain Management:    Induction:   PONV Risk Score and Plan: 2 and Ondansetron and Treatment may vary due to age or medical condition  Airway Management Planned: Nasal Cannula  Additional Equipment: None  Intra-op Plan:   Post-operative Plan:   Informed Consent: I have reviewed the patients History and Physical, chart, labs and discussed the procedure including the risks, benefits and alternatives for the proposed anesthesia with the patient or authorized representative who has indicated his/her understanding and acceptance.   Dental advisory given  Plan Discussed with: CRNA and Surgeon  Anesthesia Plan Comments:         Anesthesia Quick Evaluation

## 2017-12-02 NOTE — Brief Op Note (Signed)
12/02/2017  10:33 AM  PATIENT:  [name]  72 y.o. female  PRE-OPERATIVE DIAGNOSIS:  endstage osteoarthritis left knee  POST-OPERATIVE DIAGNOSIS:  endstage osteoarthritis left knee  PROCEDURE:  Procedure(s): LEFT TOTAL KNEE ARTHROPLASTY (Left)  SURGEON:  Surgeon(s) and Role:    Mcarthur Rossetti, MD - Primary  PHYSICIAN ASSISTANT: Benita Stabile, PA-C  ANESTHESIA:   local, regional and spinal  COUNTS:  YES  TOURNIQUET:   Total Tourniquet Time Documented: Thigh (Left) - 55 minutes Total: Thigh (Left) - 55 minutes   DICTATION: .Other Dictation: Dictation Number [id number]  PLAN OF CARE: Admit to inpatient   PATIENT DISPOSITION:  PACU - hemodynamically stable.   Delay start of Pharmacological VTE agent (>24hrs) due to surgical blood loss or risk of bleeding: no

## 2017-12-02 NOTE — Progress Notes (Signed)
Assisted Dr. Moser with left, ultrasound guided, adductor canal block. Side rails up, monitors on throughout procedure. See vital signs in flow sheet. Tolerated Procedure well.  

## 2017-12-02 NOTE — Anesthesia Postprocedure Evaluation (Signed)
Anesthesia Post Note  Patient: Lauren [name]  Procedure(s) Performed: LEFT TOTAL KNEE ARTHROPLASTY (Left Knee)     Patient location during evaluation: PACU Anesthesia Type: Regional, MAC and Spinal Level of consciousness: awake and alert Pain management: pain level controlled Vital Signs Assessment: post-procedure vital signs reviewed and stable Respiratory status: spontaneous breathing, nonlabored ventilation, respiratory function stable and patient connected to nasal cannula oxygen Cardiovascular status: stable and blood pressure returned to baseline Postop Assessment: no apparent nausea or vomiting and spinal receding Anesthetic complications: no    Last Vitals:  Vitals:   12/02/17 1248 12/02/17 1300  BP:  136/77  Pulse: 77 77  Resp: 14 14  Temp: 36.4 C 36.4 C  SpO2: 96% 96%    Last Pain:  Vitals:   12/02/17 1300  TempSrc: Oral  PainSc:                  [name]

## 2017-12-03 LAB — BASIC METABOLIC PANEL
Anion gap: 9 (ref 5–15)
BUN: 13 mg/dL (ref 6–20)
CALCIUM: 8.8 mg/dL — AB (ref 8.9–10.3)
CO2: 23 mmol/L (ref 22–32)
CREATININE: 0.73 mg/dL (ref 0.44–1.00)
Chloride: 102 mmol/L (ref 101–111)
GFR calc Af Amer: >60 mL/min (ref >60)
GLUCOSE: 170 mg/dL — AB (ref 65–99)
Potassium: 4.4 mmol/L (ref 3.5–5.1)
Sodium: 134 mmol/L — ABNORMAL LOW (ref 135–145)

## 2017-12-03 LAB — CBC
HEMATOCRIT: 32.9 % — AB (ref 36.0–46.0)
Hemoglobin: 11 g/dL — ABNORMAL LOW (ref 12.0–15.0)
MCH: 28.2 pg (ref 26.0–34.0)
MCHC: 33.4 g/dL (ref 30.0–36.0)
MCV: 84.4 fL (ref 78.0–100.0)
PLATELETS: 258 10*3/uL (ref 150–400)
RBC: 3.9 MIL/uL (ref 3.87–5.11)
RDW: 14.9 % (ref 11.5–15.5)
WBC: 15.6 10*3/uL — ABNORMAL HIGH (ref 4.0–10.5)

## 2017-12-03 MED ORDER — ASPIRIN 325 MG PO TABS
325.0000 mg | ORAL_TABLET | Freq: Two times a day (BID) | ORAL | Status: DC
  Administered 2017-12-04 – 2017-12-05 (×3): 325 mg via ORAL
  Filled 2017-12-03 (×3): qty 1

## 2017-12-03 MED ORDER — ASPIRIN 325 MG PO TABS: 325.0000 mg | ORAL_TABLET | Freq: Two times a day (BID) | ORAL | 0 refills | Status: DC

## 2017-12-03 MED ORDER — METHOCARBAMOL 500 MG PO TABS: 500.0000 mg | ORAL_TABLET | Freq: Four times a day (QID) | ORAL | 0 refills | Status: AC | PRN

## 2017-12-03 MED ORDER — HYDROCODONE-ACETAMINOPHEN 5-325 MG PO TABS: 1.0000 | ORAL_TABLET | ORAL | 0 refills | Status: DC | PRN

## 2017-12-03 NOTE — Progress Notes (Signed)
Physical Therapy Treatment Patient Details Name: Lauren [name] MRN: [id number] DOB: [birth year] Today's Date: 12/03/2017    History of Present Illness Pt s/p Lt TKA and with hx of Rt THR, DM, neuropathy, HTN, ulcerative colitis, GERD, arthitis, endometriosis.     PT Comments    Pt motivated and progressing steadily with mobility.  Pt hopeful for dc home tomorrow.   Follow Up Recommendations  Home health PT;DC plan and follow up therapy as arranged by surgeon     Equipment Recommendations  None recommended by PT    Recommendations for Other Services OT consult     Precautions / Restrictions Precautions Precautions: Fall;Knee Precaution Booklet Issued: No Precaution Comments: educated on knee precautions Required Braces or Orthoses: Knee Immobilizer - Left Knee Immobilizer - Left: Discontinue once straight leg raise with < 10 degree lag Restrictions Weight Bearing Restrictions: No LLE Weight Bearing: Weight bearing as tolerated    Mobility  Bed Mobility Overal bed mobility: Needs Assistance Bed Mobility: Supine to Sit     Supine to sit: Min assist;Mod assist     General bed mobility comments: cues for sequence and use of R LE to self assist  Transfers Overall transfer level: Needs assistance Equipment used: Rolling walker (2 wheeled) Transfers: Sit to/from Stand Sit to Stand: Min assist         General transfer comment: cues for LE management and use of UEs to self assist  Ambulation/Gait Ambulation/Gait assistance: Min assist;Min guard Ambulation Distance (Feet): 100 Feet Assistive device: Rolling walker (2 wheeled) Gait Pattern/deviations: Step-to pattern;Decreased step length - right;Decreased step length - left;Shuffle;Trunk flexed Gait velocity: decr Gait velocity interpretation: Below normal speed for age/gender General Gait Details: cues for posture, sequence and position from [employer]            Wheelchair Mobility    Modified  Rankin (Stroke Patients Only)       Balance                                            Cognition Arousal/Alertness: Awake/alert Behavior During Therapy: WFL for tasks assessed/performed Overall Cognitive Status: Within Functional Limits for tasks assessed                                        Exercises Total Joint Exercises Ankle Circles/Pumps: AROM;Both;20 reps;Supine Quad Sets: AROM;Both;10 reps;Supine Heel Slides: AAROM;Left;15 reps;Supine Straight Leg Raises: AAROM;Left;10 reps;Supine Goniometric ROM: AAROM L knee -10 - 50    General Comments        Pertinent Vitals/Pain Pain Assessment: 0-10 Pain Score: 5  Pain Location: Lt knee Pain Descriptors / Indicators: Aching;Burning Pain Intervention(s): Limited activity within patient's tolerance;Monitored during session;Premedicated before session;Ice applied    Home Living Family/patient expects to be discharged to:: Private residence Living Arrangements: Spouse/significant other Available Help at Discharge: Family;Available 24 hours/day Type of Home: House Home Access: Stairs to enter Entrance Stairs-Rails: Right;Left Home Layout: Two level Home Equipment: Environmental consultant - 2 wheels;Cane - single point;Crutches      Prior Function Level of Independence: Independent with assistive device(s)      Comments: used cane as needed   PT Goals (current goals can now be found in the care plan section) Acute Rehab PT Goals Patient Stated Goal: get back  to what she was doing before PT Goal Formulation: With patient Time For Goal Achievement: 12/09/17 Potential to Achieve Goals: Good Progress towards PT goals: Progressing toward goals    Frequency    7X/week      PT Plan Current plan remains appropriate    Co-evaluation              AM-PAC PT "6 Clicks" Daily Activity  Outcome Measure  Difficulty turning over in bed (including adjusting bedclothes, sheets and blankets)?:  Unable Difficulty moving from lying on back to sitting on the side of the bed? : Unable Difficulty sitting down on and standing up from a chair with arms (e.g., wheelchair, bedside commode, etc,.)?: Unable Help needed moving to and from a bed to chair (including a wheelchair)?: A Little Help needed walking in hospital room?: A Little Help needed climbing 3-5 steps with a railing? : A Lot 6 Click Score: 11    End of Session Equipment Utilized During Treatment: Gait belt;Left knee immobilizer Activity Tolerance: Patient tolerated treatment well Patient left: in chair;with call bell/phone within reach;with chair alarm set Nurse Communication: Mobility status PT Visit Diagnosis: Difficulty in walking, not elsewhere classified (R26.2)     Time: 8341-9622 PT Time Calculation (min) (ACUTE ONLY): 41 min  Charges:  $Gait Training: 23-37 mins $Therapeutic Exercise: 8-22 mins                    G Codes:       Pg [phone]    Meloney Feld 12/03/2017, 12:23 PM

## 2017-12-03 NOTE — Progress Notes (Signed)
Physical Therapy Treatment Patient Details Name: Lauren [name] MRN: [id number] DOB: 15-Jun-1946 Today's Date: 12/03/2017    History of Present Illness Pt s/p Lt TKA and with hx of Rt THR, DM, neuropathy, HTN, ulcerative colitis, GERD, arthitis, endometriosis.     PT Comments    Pt continues motivated and progressing with mobility despite increased pain this pm.  Pt hopeful for dc home tomorrow.   Follow Up Recommendations  Home health PT;DC plan and follow up therapy as arranged by surgeon     Equipment Recommendations  None recommended by PT    Recommendations for Other Services OT consult     Precautions / Restrictions Precautions Precautions: Fall;Knee Precaution Booklet Issued: No Precaution Comments: educated on knee precautions Required Braces or Orthoses: Knee Immobilizer - Left Knee Immobilizer - Left: Discontinue once straight leg raise with < 10 degree lag Restrictions Weight Bearing Restrictions: No LLE Weight Bearing: Weight bearing as tolerated    Mobility  Bed Mobility Overal bed mobility: Needs Assistance Bed Mobility: Sit to Supine;Supine to Sit     Supine to sit: Min assist Sit to supine: Min assist   General bed mobility comments: cues for sequence and use of R LE to self assist  Transfers Overall transfer level: Needs assistance Equipment used: Rolling walker (2 wheeled) Transfers: Sit to/from Stand Sit to Stand: Min assist         General transfer comment: cues for LE management and use of UEs to self assist  Ambulation/Gait Ambulation/Gait assistance: Min assist;Min guard Ambulation Distance (Feet): 90 Feet(and 15' twice to/from bathroom) Assistive device: Rolling walker (2 wheeled) Gait Pattern/deviations: Step-to pattern;Decreased step length - right;Decreased step length - left;Shuffle;Trunk flexed Gait velocity: decr Gait velocity interpretation: Below normal speed for age/gender General Gait Details: cues for posture,  sequence and position from [employer]            Wheelchair Mobility    Modified Rankin (Stroke Patients Only)       Balance                                            Cognition Arousal/Alertness: Awake/alert Behavior During Therapy: WFL for tasks assessed/performed Overall Cognitive Status: Within Functional Limits for tasks assessed                                        Exercises Total Joint Exercises Ankle Circles/Pumps: AROM;Both;20 reps;Supine Quad Sets: AROM;Both;10 reps;Supine Heel Slides: AAROM;Left;15 reps;Supine Straight Leg Raises: AAROM;Left;10 reps;Supine Goniometric ROM: AAROM L knee -10 - 50    General Comments        Pertinent Vitals/Pain Pain Assessment: 0-10 Pain Score: 6  Pain Location: Lt knee Pain Descriptors / Indicators: Aching;Burning Pain Intervention(s): Limited activity within patient's tolerance;Monitored during session;Premedicated before session;Ice applied    Home Living                      Prior Function            PT Goals (current goals can now be found in the care plan section) Acute Rehab PT Goals Patient Stated Goal: get back to what she was doing before PT Goal Formulation: With patient Time For Goal Achievement: 12/09/17 Potential to Achieve Goals: Good Progress towards  PT goals: Progressing toward goals    Frequency    7X/week      PT Plan Current plan remains appropriate    Co-evaluation              AM-PAC PT "6 Clicks" Daily Activity  Outcome Measure  Difficulty turning over in bed (including adjusting bedclothes, sheets and blankets)?: Unable Difficulty moving from lying on back to sitting on the side of the bed? : Unable Difficulty sitting down on and standing up from a chair with arms (e.g., wheelchair, bedside commode, etc,.)?: Unable Help needed moving to and from a bed to chair (including a wheelchair)?: A Little Help needed walking in  hospital room?: A Little Help needed climbing 3-5 steps with a railing? : A Lot 6 Click Score: 11    End of Session Equipment Utilized During Treatment: Gait belt;Left knee immobilizer Activity Tolerance: Patient tolerated treatment well Patient left: in bed;with call bell/phone within reach;with family/visitor present Nurse Communication: Mobility status PT Visit Diagnosis: Difficulty in walking, not elsewhere classified (R26.2)     Time: 1321-1401 PT Time Calculation (min) (ACUTE ONLY): 40 min  Charges:  $Gait Training: 8-22 mins $Therapeutic Exercise: 8-22 mins $Therapeutic Activity: 8-22 mins                    G Codes:       Pg [phone]    Lauren [name] 12/03/2017, 3:42 PM

## 2017-12-03 NOTE — Evaluation (Signed)
Occupational Therapy Evaluation Patient Details Name: Lauren [name] MRN: [id number] DOB: 29-Jun-1946 Today's Date: 12/03/2017    History of Present Illness Pt s/p Lt TKA and with hx of Rt THR, DM, neuropathy, HTN, ulcerative colitis, GERD, arthitis, endometriosis.    Clinical Impression   Pt s/p above. Pt independent with ADLs, PTA. Feel pt will benefit from acute OT to increase independence prior to d/c. Plan for OT session tomorrow to review shower transfer and practice LB dressing.     Follow Up Recommendations  No OT follow up;Supervision - Intermittent    Equipment Recommendations  3 in 1 bedside commode;Other (comment)(AE)    Recommendations for Other Services       Precautions / Restrictions Precautions Precautions: Fall;Knee Precaution Booklet Issued: No Precaution Comments: educated on knee precautions Required Braces or Orthoses: Knee Immobilizer - Left(no order, but used in session) Restrictions Weight Bearing Restrictions: Yes LLE Weight Bearing: Weight bearing as tolerated      Mobility Bed Mobility               General bed mobility comments: not assessed  Transfers Overall transfer level: Needs assistance Equipment used: Rolling walker (2 wheeled) Transfers: Sit to/from Stand Sit to Stand: Min guard              Balance    Used RW for ambulation- No LOB noted.                                       ADL either performed or assessed with clinical judgement   ADL Overall ADL's : Needs assistance/impaired     Grooming: Sitting;Set up(hand sanitizer)               Lower Body Dressing: Sit to/from stand;Moderate assistance   Toilet Transfer: Min guard;RW;Ambulation(3 in 1 over commode)   Toileting- Clothing Manipulation and Hygiene: Set up;Supervision/safety(standing/sitting)       Functional mobility during ADLs: Min guard;Rolling walker General ADL Comments: Educated on LB dressing technique. Educated on  AE. Educated on use of bag on walker.     Vision         Perception     Praxis      Pertinent Vitals/Pain Pain Assessment: 0-10 Pain Score: (5-6) Pain Location: Lt knee Pain Descriptors / Indicators: Aching;Burning Pain Intervention(s): Monitored during session;Repositioned;Ice applied     Hand Dominance     Extremity/Trunk Assessment Upper Extremity Assessment Upper Extremity Assessment: Overall WFL for tasks assessed   Lower Extremity Assessment Lower Extremity Assessment: Defer to PT evaluation       Communication Communication Communication: No difficulties   Cognition Arousal/Alertness: Awake/alert Behavior During Therapy: WFL for tasks assessed/performed Overall Cognitive Status: Within Functional Limits for tasks assessed                                     General Comments       Exercises     Shoulder Instructions      Home Living Family/patient expects to be discharged to:: Private residence Living Arrangements: Spouse/significant other Available Help at Discharge: Family;Available 24 hours/day Type of Home: House Home Access: Stairs to enter [employer] of Steps: 5 Entrance Stairs-Rails: Right;Left Home Layout: Two level Alternate Level Stairs-Number of Steps: 1   Bathroom Shower/Tub: Walk-in shower;Door   [employer]: Standard  Home Equipment: Twin - 2 wheels;Cane - single point;Crutches          Prior Functioning/Environment Level of Independence: Independent with assistive device(s)        Comments: used cane as needed        OT Problem List: Decreased strength;Decreased range of motion;Decreased activity tolerance;Decreased knowledge of use of DME or AE;Decreased knowledge of precautions;Pain      OT Treatment/Interventions: Self-care/ADL training;DME and/or AE instruction;Therapeutic activities;Balance training;Patient/family education    OT Goals(Current goals can be found in the care  plan section) Acute Rehab OT Goals Patient Stated Goal: get back to what she was doing before OT Goal Formulation: With patient Time For Goal Achievement: 12/10/17 Potential to Achieve Goals: Good ADL Goals Pt Will Perform Lower Body Dressing: with min guard assist;sit to/from stand;with adaptive equipment Pt Will Transfer to Toilet: with set-up;bedside commode;ambulating;with supervision Pt Will Perform Tub/Shower Transfer: Shower transfer;with set-up;with supervision;ambulating;rolling walker;3 in 1  OT Frequency: Min 2X/week   Barriers to D/C:            Co-evaluation              AM-PAC PT "6 Clicks" Daily Activity     Outcome Measure Help from another person eating meals?: None Help from another person taking care of personal grooming?: A Little Help from another person toileting, which includes using toliet, bedpan, or urinal?: A Little Help from another person bathing (including washing, rinsing, drying)?: A Little Help from another person to put on and taking off regular upper body clothing?: A Little Help from another person to put on and taking off regular lower body clothing?: A Lot 6 Click Score: 18   End of Session Equipment Utilized During Treatment: Gait belt;Rolling walker;Left knee immobilizer;Other (comment)(AE) CPM Left Knee CPM Left Knee: Off  Activity Tolerance: Patient tolerated treatment well Patient left: in chair;with call bell/phone within reach;with family/visitor present  OT Visit Diagnosis: Pain Pain - Right/Left: Left Pain - part of body: Knee                Time: 1001-1019 OT Time Calculation (min): 18 min Charges:  OT General Charges $OT Visit: 1 Visit OT Evaluation $OT Eval Moderate Complexity: 1 Mod G-Codes:      Toluwani Yadav L Zavior Thomason OTR/L 12/03/2017, 10:31 AM

## 2017-12-03 NOTE — Care Management Note (Signed)
Case Management Note  Patient Details  Name: Lauren [name] MRN: [id number] Date of Birth: [birth year]  Subjective/Objective:    S/p Left TKA                Action/Plan: Spoke to pt and husband at bedside. Pt offered choice for HH. Agreeable to [facility] arranged by surgeon's office, [facility]. Has RW at home. Requesting 3n1 bedside commode for home. Contacted [facility] for 3n1 bedside commode to be delivered to room prior to dc. Husband to assist with care.    Expected Discharge Date:                  Expected Discharge Plan:  [place]  In-House Referral:  NA  Discharge planning Services  CM Consult  Post Acute Care Choice:  Home Health Choice offered to:  Patient  DME Arranged:  3-N-1 DME Agency:  [place]:  PT [place]:  [facility] (formerly [facility])  Status of Service:  Completed, signed off  If discussed at [employer] of Stay Meetings, dates discussed:    Additional Comments:  Erenest Rasher, RN 12/03/2017, 3:28 PM

## 2017-12-03 NOTE — Progress Notes (Signed)
Subjective: 1 Day Post-Op Procedure(s) (LRB): LEFT TOTAL KNEE ARTHROPLASTY (Left) Patient reports pain as moderate.    Objective: Vital signs in last 24 hours: Temp:  [97.1 F (36.2 C)-98.4 F (36.9 C)] 98 F (36.7 C) (02/02 0945) Pulse Rate:  [63-99] 86 (02/02 0945) Resp:  [7-19] 16 (02/02 0945) BP: (111-149)/(67-95) 114/77 (02/02 0945) SpO2:  [94 %-100 %] 94 % (02/02 0945) Weight:  [229 lb (103.9 kg)] 229 lb (103.9 kg) (02/01 1300)  Intake/Output from previous day: 02/01 0701 - 02/02 0700 In: 4018.8 [P.O.:1140; I.V.:2828.8; IV Piggyback:50] Out: 4696 [Urine:3175; Blood:50] Intake/Output this shift: No intake/output data recorded.  Recent Labs    12/03/17 0549  HGB 11.0*   Recent Labs    12/03/17 0549  WBC 15.6*  RBC 3.90  HCT 32.9*  PLT 258   Recent Labs    12/03/17 0549  NA 134*  K 4.4  CL 102  CO2 23  BUN 13  CREATININE 0.73  GLUCOSE 170*  CALCIUM 8.8*   No results for input(s): LABPT, INR in the last 72 hours.  Sensation intact distally Intact pulses distally Dorsiflexion/Plantar flexion intact Incision: scant drainage No cellulitis present Compartment soft  Assessment/Plan: 1 Day Post-Op Procedure(s) (LRB): LEFT TOTAL KNEE ARTHROPLASTY (Left) Up with therapy Plan for discharge tomorrow Discharge home with home health  Mcarthur Rossetti 12/03/2017, 10:07 AM

## 2017-12-04 MED ORDER — HYDROMORPHONE HCL 2 MG PO TABS: 2.0000 mg | ORAL_TABLET | ORAL | Status: DC | PRN

## 2017-12-04 MED ORDER — HYDROMORPHONE HCL 2 MG PO TABS
2.0000 mg | ORAL_TABLET | ORAL | Status: DC | PRN
  Administered 2017-12-04: 2 mg via ORAL
  Filled 2017-12-04: qty 1

## 2017-12-04 MED ORDER — HYDROMORPHONE HCL 2 MG PO TABS
2.0000 mg | ORAL_TABLET | ORAL | Status: DC | PRN
  Administered 2017-12-04 – 2017-12-05 (×5): 2 mg via ORAL
  Filled 2017-12-04 (×5): qty 1

## 2017-12-04 NOTE — Progress Notes (Signed)
Physical Therapy Treatment Patient Details Name: Lauren [name] MRN: [id number] DOB: 28-Jan-1946 Today's Date: 12/04/2017    History of Present Illness Pt s/p Lt TKA and with hx of Rt THR, DM, neuropathy, HTN, ulcerative colitis, GERD, arthitis, endometriosis.     PT Comments    Pt cooperative but ltd by fatigue and pain level.   Follow Up Recommendations  Home health PT;DC plan and follow up therapy as arranged by surgeon     Equipment Recommendations  None recommended by PT    Recommendations for Other Services OT consult     Precautions / Restrictions Precautions Precautions: Fall;Knee Required Braces or Orthoses: Knee Immobilizer - Left Knee Immobilizer - Left: Discontinue once straight leg raise with < 10 degree lag Restrictions Weight Bearing Restrictions: No LLE Weight Bearing: Weight bearing as tolerated Other Position/Activity Restrictions: WBAT    Mobility  Bed Mobility Overal bed mobility: Needs Assistance Bed Mobility: Supine to Sit       Sit to supine: Min assist   General bed mobility comments: cues for sequence and physical assist to manage L LE  Transfers Overall transfer level: Needs assistance Equipment used: Rolling walker (2 wheeled) Transfers: Sit to/from Stand Sit to Stand: Min assist         General transfer comment: cues for LE management and use of UEs to self assist  Ambulation/Gait Ambulation/Gait assistance: Min assist;Min guard Ambulation Distance (Feet): 100 Feet Assistive device: Rolling walker (2 wheeled) Gait Pattern/deviations: Step-to pattern;Decreased step length - right;Decreased step length - left;Shuffle;Trunk flexed Gait velocity: decr Gait velocity interpretation: Below normal speed for age/gender General Gait Details: cues for posture, sequence and position from [employer]            Wheelchair Mobility    Modified Rankin (Stroke Patients Only)       Balance                                             Cognition Arousal/Alertness: Awake/alert Behavior During Therapy: WFL for tasks assessed/performed Overall Cognitive Status: Within Functional Limits for tasks assessed                                        Exercises      General Comments        Pertinent Vitals/Pain Pain Assessment: 0-10 Pain Score: 6  Pain Location: Lt knee Pain Descriptors / Indicators: Aching Pain Intervention(s): Limited activity within patient's tolerance;Monitored during session;Premedicated before session;Ice applied    Home Living                      Prior Function            PT Goals (current goals can now be found in the care plan section) Acute Rehab PT Goals Patient Stated Goal: get back to what she was doing before PT Goal Formulation: With patient Time For Goal Achievement: 12/09/17 Potential to Achieve Goals: Good Progress towards PT goals: Progressing toward goals    Frequency    7X/week      PT Plan Current plan remains appropriate    Co-evaluation              AM-PAC PT "6 Clicks" Daily Activity  Outcome Measure  Difficulty  turning over in bed (including adjusting bedclothes, sheets and blankets)?: Unable Difficulty moving from lying on back to sitting on the side of the bed? : Unable Difficulty sitting down on and standing up from a chair with arms (e.g., wheelchair, bedside commode, etc,.)?: Unable Help needed moving to and from a bed to chair (including a wheelchair)?: A Little Help needed walking in hospital room?: A Little Help needed climbing 3-5 steps with a railing? : A Lot 6 Click Score: 11    End of Session Equipment Utilized During Treatment: Gait belt;Left knee immobilizer Activity Tolerance: Patient tolerated treatment well;Patient limited by fatigue;Patient limited by pain Patient left: in bed;with call bell/phone within reach;with family/visitor present Nurse Communication: Mobility status PT  Visit Diagnosis: Difficulty in walking, not elsewhere classified (R26.2)     Time: 1157-2620 PT Time Calculation (min) (ACUTE ONLY): 33 min  Charges:  $Gait Training: 23-37 mins                    G Codes:       Pg [phone]    Laird Runnion 12/04/2017, 4:58 PM

## 2017-12-04 NOTE — Progress Notes (Signed)
Occupational Therapy Treatment Patient Details Name: Lauren [name] MRN: [id number] DOB: 01/27/1946 Today's Date: 12/04/2017    History of present illness Pt s/p Lt TKA and with hx of Rt THR, DM, neuropathy, HTN, ulcerative colitis, GERD, arthitis, endometriosis.    OT comments  Pt performed sponge bathing, dressing and toileting with set up to moderate assist. Pt is knowledgeable in compensatory strategies for LB ADL and will rely on her family's assist until she can perform on her own. Pt and family educated in multiple uses of 3 in 1 and shower transfer technique, verbalized understanding.   Follow Up Recommendations  No OT follow up;Supervision - Intermittent    Equipment Recommendations  3 in 1 bedside commode    Recommendations for Other Services      Precautions / Restrictions Precautions Precautions: Fall;Knee Required Braces or Orthoses: Knee Immobilizer - Left Knee Immobilizer - Left: Discontinue once straight leg raise with < 10 degree lag Restrictions Weight Bearing Restrictions: No LLE Weight Bearing: Weight bearing as tolerated       Mobility Bed Mobility               General bed mobility comments: pt received in chair  Transfers Overall transfer level: Needs assistance Equipment used: Rolling walker (2 wheeled) Transfers: Sit to/from Stand Sit to Stand: Min guard         General transfer comment: cues for technique, min guard for safety, increased time    Balance                                           ADL either performed or assessed with clinical judgement   ADL Overall ADL's : Needs assistance/impaired         Upper Body Bathing: Set up;Sitting   Lower Body Bathing: Minimal assistance;Sit to/from stand   Upper Body Dressing : Set up;Sitting   Lower Body Dressing: Moderate assistance;Sit to/from stand   Toilet Transfer: Min guard;RW;Ambulation   Toileting- Water quality scientist and Hygiene: Min guard;Set  up;Sit to/from Nurse, children's Details (indicate cue type and reason): educated in shower transfer   General ADL Comments: Pt educated in compensatory strategies, safe footwear. Pt's husband will assist with LB ADL until pt can complete independently. Pt aware 3 in 1 can be used as BSC and over toilet.     Vision       Perception     Praxis      Cognition Arousal/Alertness: Awake/alert Behavior During Therapy: WFL for tasks assessed/performed Overall Cognitive Status: Within Functional Limits for tasks assessed                                          Exercises     Shoulder Instructions       General Comments      Pertinent Vitals/ Pain       Pain Assessment: 0-10 Pain Score: 6  Pain Location: Lt knee Pain Descriptors / Indicators: Aching Pain Intervention(s): Premedicated before session;Monitored during session;Repositioned;Ice applied  Home Living  Prior Functioning/Environment              Frequency  Min 2X/week        Progress Toward Goals  OT Goals(current goals can now be found in the care plan section)  Progress towards OT goals: Progressing toward goals  Acute Rehab OT Goals Patient Stated Goal: get back to what she was doing before OT Goal Formulation: With patient Time For Goal Achievement: 12/10/17 Potential to Achieve Goals: Good  Plan Discharge plan remains appropriate    Co-evaluation                 AM-PAC PT "6 Clicks" Daily Activity     Outcome Measure   Help from another person eating meals?: None Help from another person taking care of personal grooming?: A Little Help from another person toileting, which includes using toliet, bedpan, or urinal?: A Little Help from another person bathing (including washing, rinsing, drying)?: A Little Help from another person to put on and taking off regular upper body clothing?: A Little Help  from another person to put on and taking off regular lower body clothing?: A Lot 6 Click Score: 18    End of Session Equipment Utilized During Treatment: Gait belt;Rolling walker  OT Visit Diagnosis: Pain   Activity Tolerance Patient tolerated treatment well   Patient Left in chair;with call bell/phone within reach;with family/visitor present   Nurse Communication (ADL completed)        Time: 0321-2248 OT Time Calculation (min): 34 min  Charges: OT General Charges $OT Visit: 1 Visit OT Treatments $Self Care/Home Management : 23-37 mins  12/04/2017 Nestor Lewandowsky, OTR/L Pager: [place], Lauren [name] 12/04/2017, 9:48 AM

## 2017-12-04 NOTE — Progress Notes (Signed)
Subjective: Patient stable.  Having a lot of pain and did not have a great day yesterday.  She states her oral medicine is not working but the IV Dilaudid is.  She is requesting oral Dilaudid to try today before discharge tomorrow   Objective: Vital signs in last 24 hours: Temp:  [98 F (36.7 C)-98.8 F (37.1 C)] 98.8 F (37.1 C) (02/03 0621) Pulse Rate:  [86-100] 100 (02/03 0621) Resp:  [16-17] 17 (02/03 0621) BP: (114-141)/(67-83) 122/68 (02/03 0621) SpO2:  [94 %-97 %] 97 % (02/03 0621)  Intake/Output from previous day: 02/02 0701 - 02/03 0700 In: 1368.8 [P.O.:1060; I.V.:308.8] Out: 600 [Urine:600] Intake/Output this shift: No intake/output data recorded.  Exam:  Dorsiflexion/Plantar flexion intact  Labs: Recent Labs    12/03/17 0549  HGB 11.0*   Recent Labs    12/03/17 0549  WBC 15.6*  RBC 3.90  HCT 32.9*  PLT 258   Recent Labs    12/03/17 0549  NA 134*  K 4.4  CL 102  CO2 23  BUN 13  CREATININE 0.73  GLUCOSE 170*  CALCIUM 8.8*   No results for input(s): LABPT, INR in the last 72 hours.  Assessment/Plan: Plan at this time is to change over her pain medicine regimen.  We will see how she does with therapy today and mobilization.  Possible discharge tomorrow.   G Scott Laronda Lisby 12/04/2017, 8:23 AM

## 2017-12-04 NOTE — Progress Notes (Signed)
Physical Therapy Treatment Patient Details Name: Lauren [name] MRN: [id number] DOB: 08-29-1946 Today's Date: 12/04/2017    History of Present Illness Pt s/p Lt TKA and with hx of Rt THR, DM, neuropathy, HTN, ulcerative colitis, GERD, arthitis, endometriosis.     PT Comments    Pt continues cooperative but ltd by fatigue/pain.  This pm, pt assisted back from bathroom to bed and performed therex program with assistance   Follow Up Recommendations  Home health PT;DC plan and follow up therapy as arranged by surgeon     Equipment Recommendations  None recommended by PT    Recommendations for Other Services OT consult     Precautions / Restrictions Precautions Precautions: Fall;Knee Required Braces or Orthoses: Knee Immobilizer - Left Knee Immobilizer - Left: Discontinue once straight leg raise with < 10 degree lag Restrictions Weight Bearing Restrictions: No LLE Weight Bearing: Weight bearing as tolerated Other Position/Activity Restrictions: WBAT    Mobility  Bed Mobility Overal bed mobility: Needs Assistance Bed Mobility: Sit to Supine       Sit to supine: Min assist   General bed mobility comments: Increased time with cues for sequence and physical assist to manage L LE  Transfers Overall transfer level: Needs assistance Equipment used: Rolling walker (2 wheeled) Transfers: Sit to/from Stand Sit to Stand: Min assist         General transfer comment: cues for LE management and use of UEs to self assist  Ambulation/Gait Ambulation/Gait assistance: Min assist;Min guard Ambulation Distance (Feet): 15 Feet Assistive device: Rolling walker (2 wheeled) Gait Pattern/deviations: Step-to pattern;Decreased step length - right;Decreased step length - left;Shuffle;Trunk flexed Gait velocity: decr Gait velocity interpretation: Below normal speed for age/gender General Gait Details: cues for posture, sequence and position from [employer]             Wheelchair Mobility    Modified Rankin (Stroke Patients Only)       Balance                                            Cognition Arousal/Alertness: Awake/alert Behavior During Therapy: WFL for tasks assessed/performed Overall Cognitive Status: Within Functional Limits for tasks assessed                                        Exercises Total Joint Exercises Ankle Circles/Pumps: AROM;Both;20 reps;Supine Quad Sets: AROM;Both;Supine;15 reps Heel Slides: AAROM;Left;Supine;20 reps Straight Leg Raises: AAROM;Left;Supine;20 reps    General Comments        Pertinent Vitals/Pain Pain Assessment: 0-10 Pain Score: 7  Pain Location: Lt knee with ther ex Pain Descriptors / Indicators: Aching Pain Intervention(s): Limited activity within patient's tolerance;Monitored during session;Premedicated before session;Ice applied    Home Living                      Prior Function            PT Goals (current goals can now be found in the care plan section) Acute Rehab PT Goals Patient Stated Goal: get back to what she was doing before PT Goal Formulation: With patient Time For Goal Achievement: 12/09/17 Potential to Achieve Goals: Good Progress towards PT goals: Progressing toward goals    Frequency    7X/week  PT Plan Current plan remains appropriate    Co-evaluation              AM-PAC PT "6 Clicks" Daily Activity  Outcome Measure  Difficulty turning over in bed (including adjusting bedclothes, sheets and blankets)?: Unable Difficulty moving from lying on back to sitting on the side of the bed? : Unable Difficulty sitting down on and standing up from a chair with arms (e.g., wheelchair, bedside commode, etc,.)?: Unable Help needed moving to and from a bed to chair (including a wheelchair)?: A Little Help needed walking in hospital room?: A Little Help needed climbing 3-5 steps with a railing? : A Lot 6 Click  Score: 11    End of Session Equipment Utilized During Treatment: Gait belt;Left knee immobilizer Activity Tolerance: Patient tolerated treatment well;Patient limited by fatigue;Patient limited by pain Patient left: in bed;with call bell/phone within reach;with family/visitor present Nurse Communication: Mobility status PT Visit Diagnosis: Difficulty in walking, not elsewhere classified (R26.2)     Time: 4193-7902 PT Time Calculation (min) (ACUTE ONLY): 30 min  Charges:  $Gait Training: 8-22 mins $Therapeutic Exercise: 8-22 mins                    G Codes:       Pg [phone]    Lauren [name] 12/04/2017, 5:02 PM

## 2017-12-05 NOTE — Discharge Summary (Signed)
Patient ID: Lauren [name] MRN: [id number] DOB/AGE: Mar 17, 1946 72 y.o.  Admit date: 12/02/2017 Discharge date: 12/05/2017  Admission Diagnoses:  Principal Problem:   Primary osteoarthritis of left knee Active Problems:   Status post total left knee replacement   Discharge Diagnoses:  Same  Past Medical History:  Diagnosis Date  . Arthritis    Back, Hip- right  . Diabetes mellitus without complication ([place])    Type II  . Endometriosis   . Family history of adverse reaction to anesthesia    Father - N/V - blood pressure; daughter N/V  . GERD (gastroesophageal reflux disease)   . History of kidney stones   . History of ulcerative colitis   . Hypertension   . Insomnia   . 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 Neuropathy   . Pneumonia    1983, 2003  . Umbilical hernia   . Vitamin D deficiency     Surgeries: Procedure(s): LEFT TOTAL KNEE ARTHROPLASTY on 12/02/2017   Consultants:   Discharged Condition: Improved  Hospital Course: Lauren [name] is an 72 y.o. female who was admitted 12/02/2017 for operative treatment ofPrimary osteoarthritis of left knee. Patient has severe unremitting pain that affects sleep, daily activities, and work/hobbies. After pre-op clearance the patient was taken to the operating room on 12/02/2017 and underwent  Procedure(s): LEFT TOTAL KNEE ARTHROPLASTY.    Patient was given perioperative antibiotics:  Anti-infectives (From admission, onward)   Start     Dose/Rate Route Frequency Ordered Stop   12/02/17 1500  clindamycin (CLEOCIN) IVPB 600 mg     600 mg 100 mL/hr over 30 Minutes Intravenous Every 6 hours 12/02/17 1305 12/02/17 2053   12/02/17 0723  clindamycin (CLEOCIN) 900 MG/50ML IVPB    Comments:  Harvell, Gwendolyn  : cabinet override      12/02/17 0723 12/02/17 0901   12/02/17 0718  clindamycin (CLEOCIN) IVPB 900 mg  Status:  Discontinued     900 mg 100 mL/hr over 30 Minutes Intravenous On call to O.R. 12/02/17 0626 12/02/17 0724    12/02/17 0718  clindamycin (CLEOCIN) IVPB 900 mg     900 mg 100 mL/hr over 30 Minutes Intravenous On call to O.R. 12/02/17 9485 12/02/17 0901       Patient was given sequential compression devices, early ambulation, and chemoprophylaxis to prevent DVT.  Patient benefited maximally from hospital stay and there were no complications.    Recent vital signs:  Patient Vitals for the past 24 hrs:  BP Temp Temp src Pulse Resp SpO2  12/05/17 0504 134/64 (!) 100.9 F (38.3 C) Oral (!) 110 17 93 %  12/04/17 2007 (!) 114/50 98.7 F (37.1 C) Oral - 18 94 %  12/04/17 1447 132/69 98.3 F (36.8 C) Oral 89 19 98 %     Recent laboratory studies:  Recent Labs    12/03/17 0549  WBC 15.6*  HGB 11.0*  HCT 32.9*  PLT 258  NA 134*  K 4.4  CL 102  CO2 23  BUN 13  CREATININE 0.73  GLUCOSE 170*  CALCIUM 8.8*     Discharge Medications:   Allergies as of 12/05/2017      Reactions   Ciprofloxacin Hcl Hives   Percocet [oxycodone-acetaminophen] Other (See Comments)   RESPIRATORY DISTRESS   Penicillins Hives, Other (See Comments)   Has patient had a PCN reaction causing immediate rash, facial/tongue/throat swelling, SOB or lightheadedness with hypotension: no Has patient had a PCN reaction causing severe rash  involving mucus membranes or skin necrosis: no Has patient had a PCN reaction that required hospitalization no Has patient had a PCN reaction occurring within the last 10 years: no If all of the above answers are "NO", then may proceed with Cephalosporin use.   Sulfa Antibiotics Hives   Tape Other (See Comments)   Adhesives - Causes Blisters  No band aids, and plastic tape    Tetanus Toxoids Hives, Rash      Medication List    STOP taking these medications   aspirin EC 81 MG tablet Replaced by:  aspirin 325 MG tablet     TAKE these medications   amLODipine 5 MG tablet Commonly known as:  NORVASC Take 1 tablet (5 mg total) by mouth daily. What changed:  when to take this    ASPERCREME W/LIDOCAINE 4 % cream Generic drug:  lidocaine Apply 1 application topically daily as needed (for hip pain).   aspirin 325 MG tablet Take 1 tablet (325 mg total) by mouth 2 (two) times daily after a meal. Replaces:  aspirin EC 81 MG tablet   b complex vitamins tablet Take 1 tablet by mouth daily.   balsalazide 750 MG capsule Commonly known as:  COLAZAL Take 1,500-2,250 mg by mouth See admin instructions. Take 2250 mg by mouth in the morning and take 1500-2250 mg by mouth at bedtime   cholestyramine 4 GM/DOSE powder Commonly known as:  QUESTRAN Take 4 g by mouth daily.   Diclofenac Sodium 2 % Soln Commonly known as:  PENNSAID Place 2 Squirts onto the skin 2 (two) times daily as needed. What changed:  reasons to take this   fluticasone 50 MCG/ACT nasal spray Commonly known as:  FLONASE USE 1-2 SPRAYS IN EACH NOSTRIL DAILY AS NEEDED FOR ALLERGIES   furosemide 20 MG tablet Commonly known as:  LASIX Take 20 mg by mouth daily.   gabapentin 300 MG capsule Commonly known as:  NEURONTIN Take 600 mg by mouth at bedtime.   glimepiride 4 MG tablet Commonly known as:  AMARYL Take 4 mg by mouth daily with breakfast.   HM VITAMIN D3 2000 units Caps Generic drug:  Cholecalciferol Take 4,000 Units by mouth daily.   HYDROcodone-acetaminophen 5-325 MG tablet Commonly known as:  NORCO/VICODIN Take 1-2 tablets by mouth every 4 (four) hours as needed (pain). Reported on 11/05/2015 What changed:    when to take this  reasons to take this  additional instructions   JOINT HEALTH Caps Take 300 mg by mouth daily. Arthrocen 300 mg Supplement   magnesium gluconate 500 MG tablet Commonly known as:  MAGONATE Take 500 mg by mouth at bedtime.   metFORMIN 500 MG 24 hr tablet Commonly known as:  GLUCOPHAGE-XR Take 1,000 mg by mouth 2 (two) times daily.   methocarbamol 500 MG tablet Commonly known as:  ROBAXIN Take 1 tablet (500 mg total) by mouth every 6 (six) hours as  needed for muscle spasms. What changed:  See the new instructions.   NASAL SPRAY NA Place 1-2 sprays into both nostrils at bedtime as needed (for allergies).   pantoprazole 40 MG tablet Commonly known as:  PROTONIX Take 40 mg by mouth 2 (two) times daily.   quinapril 40 MG tablet Commonly known as:  ACCUPRIL Take 40 mg by mouth daily.   rosuvastatin 20 MG tablet Commonly known as:  CRESTOR TAKE 1 TABLET BY MOUTH EVERY DAY What changed:    how much to take  how to take this  when  to take this            Durable Medical Equipment  (From admission, onward)        Start     Ordered   12/02/17 1306  DME 3 n 1  Once     12/02/17 1305   12/02/17 1306  DME Walker rolling  Once    Question:  Patient needs a walker to treat with the following condition  Answer:  Status post total left knee replacement   12/02/17 1305      Diagnostic Studies: Dg Knee Left Port  Result Date: 12/02/2017 CLINICAL DATA:  Postop left total knee arthroplasty EXAM: PORTABLE LEFT KNEE - 1-2 VIEW COMPARISON:  None. FINDINGS: The tibial and femoral components are well seated. No complicating features are demonstrated. IMPRESSION: Well seated components of a total left knee arthroplasty. Electronically Signed   By: Marijo Sanes M.D.   On: 12/02/2017 15:27    Disposition: 06-Home-Health Care Svc    Follow-up Information    Mcarthur Rossetti, MD Follow up in 2 week(s).   Specialty:  Orthopedic Surgery Contact information: [place] [address] [phone]        [facility] Follow up.   Specialty:  [place] Why:  Home Health Physical Therapy- agency will call to arrange initial visit Contact information: [address] [place] [address] [phone]            Signed: Mcarthur Rossetti 12/05/2017, 7:26 AM

## 2017-12-05 NOTE — Op Note (Signed)
NAME:  Milhoan, Graysen                 ACCOUNT NO.:  MEDICAL RECORD NO.:  [id number]  LOCATION:                                 FACILITY:  PHYSICIAN:  Lind Guest. [name], M.D.DATE OF BIRTH:  DATE OF PROCEDURE:  12/02/2017 DATE OF DISCHARGE:                              OPERATIVE REPORT   PREOPERATIVE DIAGNOSIS:  Primary osteoarthritis and degenerative joint disease, left knee.  POSTOPERATIVE DIAGNOSIS:  Primary osteoarthritis and degenerative joint disease, left knee.  PROCEDURE:  Left total knee arthroplasty.  IMPLANTS:  [employer] Triathlon cemented knee system, a size 3 cemented femur, size 3 cemented tibial tray, 11-mm fix-bearing polyethylene insert, size 28 patellar button.  SURGEON:  Lind Guest. [name], M.D.  ASSISTANT:  Erskine Emery, PA-C.  ANESTHESIA: 1. Left lower extremity adductor canal block. 2. Spinal. 3. Local with Exparel in the arthrotomy.  ANTIBIOTICS:  2 g of IV Ancef.  TOURNIQUET TIME:  Under 1 hour.  BLOOD LOSS:  Less than 200 cc.  COMPLICATIONS:  None.  INDICATIONS:  Lauren [name] is well known to me.  She is a very pleasant 72- year-old female with complete end-stage arthritis of her left knee.  Her pain is daily and it has detrimentally affected her activities of daily living, her quality of life and mobility.  She has tried and failed all forms of conservative treatment and has had an arthroscopic intervention as well.  She understands the risk of acute blood loss anemia, nerve and vessel injury, fracture, infection, and DVT.  She understands our goals are to decrease pain, improve mobility, and overall improved quality of life.  PROCEDURE DESCRIPTION:  After informed consent was obtained, appropriate left knee was marked.  Anesthesia obtained an adductor canal block in the holding room.  She was then brought to the operating room, sat up on the operating table.  Spinal anesthesia was obtained.  She was then laid back in a supine  position.  A Foley catheter was placed.  Then, a nonsterile tourniquet was placed around her upper left thigh.  Her left thigh, knee, leg and ankle were prepped and draped with DuraPrep and sterile drapes including a sterile stockinette.  A time-out was called and she was identified as correct patient and correct left knee.  We then used an Esmarch to wrap out the leg and tourniquet was inflated to 300 mm of pressure.  We made a direct midline incision over the knee and carried the incision proximally and distally.  We dissected down the knee joint and carried out a medial parapatellar arthrotomy finding a significant large joint effusion and significant osteoarthritis throughout the knee.  With the knee in a flexed position, we used our extramedullary cutting guide for taking 9 mm off the high side correcting for varus, valgus and neutral slope.  After removing remnants of ACL, PCL, medial and lateral meniscus, we made this cut without difficulty.  We then used an intramedullary guide through the intercondylar notch of the femur, setting our distal femoral cutting guide for 8 mm distal femoral cut and setting this for 5 degrees, externally rotated for left knee.  We made that cut without difficulty and brought the knee back down to  full extension, and a 9-mm extension block had achieved full extension.  We then went back to the femur, put our femoral sizing guide based off Whiteside's line and the epicondylar axis.  We chose a size 3 femur.  We put our 4-in-1 cutting block for size 3 femur, we made our anterior and posterior cuts followed by our chamfer cuts.  We then made our femoral box cut.  We then went back to the tibia and trialed a size 3 tibial tray and we were pleased with the coverage of the tibia.  We then drilled a post and did our keel punch for universal base plate.  With the size 3 tibial trial followed by the size 3 femur, we placed a 9-mm trial polyethylene insert and I  was pleased with that stability and range of motion.  We then made our patellar cut and drilled 3 holes for a size 28 central patellar button. We then removed all instrumentation from the knee and irrigated the knee with normal saline solution using pulsatile lavage.  We placed our mixture of 20 cc of Exparel and 20 cc of saline around the knee joint capsule.  We then mixed our cement and then cemented the real [employer] Triathlon universal tibial base plate size 3 followed by the real size 3 femur.  We placed our real 11-mm fix-bearing polyethylene insert and cemented our patellar button.  Once the cement had hardened, we removed cement debris from the knee and let the tourniquet down.  Hemostasis was obtained with electrocautery.  We irrigated the knee again with normal saline solution and then closed our arthrotomy with interrupted #1 Vicryl suture followed by 0 Vicryl in the deep tissue, 2-0 Vicryl in the subcutaneous tissue, 4-0 Monocryl subcuticular stitch and Steri-Strips on the skin.  Well-padded sterile dressing was applied.  She was taken to the recovery room in stable condition.  All final counts were correct.  There were no complications noted.  Of note, Erskine Emery, PA- C assisted in the entire case.  His assistance was crucial for facilitating all aspects of this case.     Lind Guest. [name], M.D.     CYB/MEDQ  D:  12/02/2017  T:  12/03/2017  Job:  [id number]

## 2017-12-05 NOTE — Progress Notes (Signed)
Physical Therapy Treatment Patient Details Name: Lauren [name] MRN: [id number] DOB: [birth year] Today's Date: 12/05/2017    History of Present Illness Pt s/p Lt TKA and with hx of Rt THR, DM, neuropathy, HTN, ulcerative colitis, GERD, arthitis, endometriosis.     PT Comments    Patient is ready for DC.   Follow Up Recommendations  Home health PT;DC plan and follow up therapy as arranged by surgeon     Equipment Recommendations  None recommended by PT    Recommendations for Other Services       Precautions / Restrictions Precautions Precautions: Fall;Knee Precaution Comments: educated on knee precautions Required Braces or Orthoses: Knee Immobilizer - Left Knee Immobilizer - Left: Discontinue once straight leg raise with < 10 degree lag    Mobility  Bed Mobility   Bed Mobility: Supine to Sit;Sit to Supine     Supine to sit: Supervision Sit to supine: Supervision      Transfers Overall transfer level: Needs assistance Equipment used: Rolling walker (2 wheeled) Transfers: Sit to/from Stand Sit to Stand: Supervision         General transfer comment: cues for LE management and use of UEs to self assist  Ambulation/Gait Ambulation/Gait assistance: Min guard Ambulation Distance (Feet): 20 Feet Assistive device: Rolling walker (2 wheeled) Gait Pattern/deviations: Step-to pattern;Step-through pattern     General Gait Details: cues for posture, sequence and position from RW   Stairs Stairs: Yes   Stair Management: One rail Right;Step to pattern;Forwards;With crutches Number of Stairs: 4 General stair comments: spouse present to assist  Wheelchair Mobility    Modified Rankin (Stroke Patients Only)       Balance                                            Cognition Arousal/Alertness: Awake/alert                                            Exercises Total Joint Exercises Ankle Circles/Pumps:  AROM;Both;Supine;5 reps Quad Sets: AROM;Both;Supine;5 reps Towel Squeeze: AROM;Both;5 reps Heel Slides: AAROM;Left;5 reps Hip ABduction/ADduction: Left;5 reps;AAROM Straight Leg Raises: AAROM;Left;5 reps Goniometric ROM: -10-50 left knee flexion    General Comments        Pertinent Vitals/Pain Pain Score: 3  Pain Location: Lt knee with ther ex Pain Descriptors / Indicators: Aching;Sore Pain Intervention(s): Monitored during session;Premedicated before session    Home Living           Entrance Stairs-Rails: Right;Left          Prior Function            PT Goals (current goals can now be found in the care plan section) Progress towards PT goals: Progressing toward goals    Frequency           PT Plan Current plan remains appropriate    Co-evaluation              AM-PAC PT "6 Clicks" Daily Activity  Outcome Measure  Difficulty turning over in bed (including adjusting bedclothes, sheets and blankets)?: None Difficulty moving from lying on back to sitting on the side of the bed? : None Difficulty sitting down on and standing up from a chair with arms (e.g., wheelchair, bedside commode,  etc,.)?: A Little Help needed moving to and from a bed to chair (including a wheelchair)?: A Little Help needed walking in hospital room?: A Little Help needed climbing 3-5 steps with a railing? : A Lot 6 Click Score: 19    End of Session   Activity Tolerance: Patient tolerated treatment well;Patient limited by fatigue;Patient limited by pain Patient left: in bed;with call bell/phone within reach;with family/visitor present Nurse Communication: Mobility status       Time: 9597-4718 PT Time Calculation (min) (ACUTE ONLY): 46 min  Charges:  $Gait Training: 8-22 mins $Therapeutic Exercise: 8-22 mins $Self Care/Home Management: 8-22                    G CodesTresa [name] PT [phone]   Lauren [name] 12/05/2017, 1:30 PM

## 2017-12-05 NOTE — Progress Notes (Signed)
Subjective: 3 Days Post-Op Procedure(s) (LRB): LEFT TOTAL KNEE ARTHROPLASTY (Left) Patient reports pain as moderate.    Objective: Vital signs in last 24 hours: Temp:  [98.3 F (36.8 C)-100.9 F (38.3 C)] 100.9 F (38.3 C) (02/04 0504) Pulse Rate:  [89-110] 110 (02/04 0504) Resp:  [17-19] 17 (02/04 0504) BP: (114-134)/(50-69) 134/64 (02/04 0504) SpO2:  [93 %-98 %] 93 % (02/04 0504)  Intake/Output from previous day: 02/03 0701 - 02/04 0700 In: 1050 [P.O.:1050] Out: 1700 [Urine:1700] Intake/Output this shift: No intake/output data recorded.  Recent Labs    12/03/17 0549  HGB 11.0*   Recent Labs    12/03/17 0549  WBC 15.6*  RBC 3.90  HCT 32.9*  PLT 258   Recent Labs    12/03/17 0549  NA 134*  K 4.4  CL 102  CO2 23  BUN 13  CREATININE 0.73  GLUCOSE 170*  CALCIUM 8.8*   No results for input(s): LABPT, INR in the last 72 hours.  Sensation intact distally Intact pulses distally Dorsiflexion/Plantar flexion intact Incision: dressing C/D/I No cellulitis present Compartment soft  Assessment/Plan: 3 Days Post-Op Procedure(s) (LRB): LEFT TOTAL KNEE ARTHROPLASTY (Left) Up with therapy Discharge home with home health today.  Mcarthur Rossetti 12/05/2017, 7:25 AM

## 2017-12-06 ENCOUNTER — Other Ambulatory Visit (INDEPENDENT_AMBULATORY_CARE_PROVIDER_SITE_OTHER): Payer: Self-pay

## 2017-12-06 DIAGNOSIS — G4700 Insomnia, unspecified: Secondary | ICD-10-CM | POA: Diagnosis not present

## 2017-12-06 DIAGNOSIS — Z7982 Long term (current) use of aspirin: Secondary | ICD-10-CM | POA: Diagnosis not present

## 2017-12-06 DIAGNOSIS — K219 Gastro-esophageal reflux disease without esophagitis: Secondary | ICD-10-CM | POA: Diagnosis not present

## 2017-12-06 DIAGNOSIS — Z9181 History of falling: Secondary | ICD-10-CM | POA: Diagnosis not present

## 2017-12-06 DIAGNOSIS — E1142 Type 2 diabetes mellitus with diabetic polyneuropathy: Secondary | ICD-10-CM | POA: Diagnosis not present

## 2017-12-06 DIAGNOSIS — I10 Essential (primary) hypertension: Secondary | ICD-10-CM | POA: Diagnosis not present

## 2017-12-06 DIAGNOSIS — Z96652 Presence of left artificial knee joint: Secondary | ICD-10-CM | POA: Diagnosis not present

## 2017-12-06 DIAGNOSIS — N809 Endometriosis, unspecified: Secondary | ICD-10-CM | POA: Diagnosis not present

## 2017-12-06 DIAGNOSIS — Z471 Aftercare following joint replacement surgery: Secondary | ICD-10-CM | POA: Diagnosis not present

## 2017-12-06 DIAGNOSIS — Z96641 Presence of right artificial hip joint: Secondary | ICD-10-CM | POA: Diagnosis not present

## 2017-12-06 DIAGNOSIS — Z79891 Long term (current) use of opiate analgesic: Secondary | ICD-10-CM | POA: Diagnosis not present

## 2017-12-06 DIAGNOSIS — Z87891 Personal history of nicotine dependence: Secondary | ICD-10-CM | POA: Diagnosis not present

## 2017-12-06 DIAGNOSIS — Z7984 Long term (current) use of oral hypoglycemic drugs: Secondary | ICD-10-CM | POA: Diagnosis not present

## 2017-12-12 ENCOUNTER — Telehealth (INDEPENDENT_AMBULATORY_CARE_PROVIDER_SITE_OTHER): Payer: Self-pay | Admitting: *Deleted

## 2017-12-12 NOTE — Telephone Encounter (Signed)
Pt called had TKR on Feb 1 and has on a bandage says she thinks she may be having allergy to it. Says she is itching and cant seem to get to stop and doesn't know what to do without having to take bandage off. She has a follow up with CB on Thursday. Would like to know what she needs to do for the itching.   [phone]

## 2017-12-12 NOTE — Telephone Encounter (Signed)
Patient aware she can remove bandage and just put on dry dressing

## 2017-12-13 DIAGNOSIS — N809 Endometriosis, unspecified: Secondary | ICD-10-CM | POA: Diagnosis not present

## 2017-12-13 DIAGNOSIS — E1142 Type 2 diabetes mellitus with diabetic polyneuropathy: Secondary | ICD-10-CM | POA: Diagnosis not present

## 2017-12-13 DIAGNOSIS — Z7982 Long term (current) use of aspirin: Secondary | ICD-10-CM | POA: Diagnosis not present

## 2017-12-13 DIAGNOSIS — Z96652 Presence of left artificial knee joint: Secondary | ICD-10-CM | POA: Diagnosis not present

## 2017-12-13 DIAGNOSIS — Z96641 Presence of right artificial hip joint: Secondary | ICD-10-CM | POA: Diagnosis not present

## 2017-12-13 DIAGNOSIS — K219 Gastro-esophageal reflux disease without esophagitis: Secondary | ICD-10-CM | POA: Diagnosis not present

## 2017-12-13 DIAGNOSIS — I10 Essential (primary) hypertension: Secondary | ICD-10-CM | POA: Diagnosis not present

## 2017-12-13 DIAGNOSIS — Z471 Aftercare following joint replacement surgery: Secondary | ICD-10-CM | POA: Diagnosis not present

## 2017-12-13 DIAGNOSIS — G4700 Insomnia, unspecified: Secondary | ICD-10-CM | POA: Diagnosis not present

## 2017-12-13 DIAGNOSIS — Z79891 Long term (current) use of opiate analgesic: Secondary | ICD-10-CM | POA: Diagnosis not present

## 2017-12-13 DIAGNOSIS — Z7984 Long term (current) use of oral hypoglycemic drugs: Secondary | ICD-10-CM | POA: Diagnosis not present

## 2017-12-13 DIAGNOSIS — Z87891 Personal history of nicotine dependence: Secondary | ICD-10-CM | POA: Diagnosis not present

## 2017-12-13 DIAGNOSIS — Z9181 History of falling: Secondary | ICD-10-CM | POA: Diagnosis not present

## 2017-12-15 ENCOUNTER — Ambulatory Visit (INDEPENDENT_AMBULATORY_CARE_PROVIDER_SITE_OTHER): Payer: PPO | Admitting: Orthopaedic Surgery

## 2017-12-15 ENCOUNTER — Encounter (INDEPENDENT_AMBULATORY_CARE_PROVIDER_SITE_OTHER): Payer: Self-pay | Admitting: Orthopaedic Surgery

## 2017-12-15 DIAGNOSIS — Z96652 Presence of left artificial knee joint: Secondary | ICD-10-CM

## 2017-12-15 NOTE — Progress Notes (Signed)
The patient is now almost 2 weeks status post a left total knee arthroplasty.  She says she is ready for outpatient therapy.  She is been working through pushing through the pain.  She does take hydrocodone.  On examination her incision looks good.  There is no Steri-Strips in place at all this point.  Her calf is soft.  She has full extension to about 95 degrees flexion.  At this point we will have her transition outpatient physical therapy.  All questions and concerns were answered and addressed.  She will go back to 81 mg aspirin daily that she was on before this.  We will see her back in 4 weeks see how she is doing overall but no x-rays are needed.

## 2017-12-20 DIAGNOSIS — Z471 Aftercare following joint replacement surgery: Secondary | ICD-10-CM | POA: Diagnosis not present

## 2017-12-20 DIAGNOSIS — M25562 Pain in left knee: Secondary | ICD-10-CM | POA: Diagnosis not present

## 2017-12-20 DIAGNOSIS — R262 Difficulty in walking, not elsewhere classified: Secondary | ICD-10-CM | POA: Diagnosis not present

## 2017-12-20 DIAGNOSIS — M62552 Muscle wasting and atrophy, not elsewhere classified, left thigh: Secondary | ICD-10-CM | POA: Diagnosis not present

## 2017-12-20 DIAGNOSIS — M25662 Stiffness of left knee, not elsewhere classified: Secondary | ICD-10-CM | POA: Diagnosis not present

## 2017-12-23 DIAGNOSIS — M25562 Pain in left knee: Secondary | ICD-10-CM | POA: Diagnosis not present

## 2017-12-23 DIAGNOSIS — Z79899 Other long term (current) drug therapy: Secondary | ICD-10-CM | POA: Diagnosis not present

## 2017-12-23 DIAGNOSIS — M150 Primary generalized (osteo)arthritis: Secondary | ICD-10-CM | POA: Diagnosis not present

## 2017-12-23 DIAGNOSIS — R262 Difficulty in walking, not elsewhere classified: Secondary | ICD-10-CM | POA: Diagnosis not present

## 2017-12-23 DIAGNOSIS — G894 Chronic pain syndrome: Secondary | ICD-10-CM | POA: Diagnosis not present

## 2017-12-23 DIAGNOSIS — M25662 Stiffness of left knee, not elsewhere classified: Secondary | ICD-10-CM | POA: Diagnosis not present

## 2017-12-23 DIAGNOSIS — Z471 Aftercare following joint replacement surgery: Secondary | ICD-10-CM | POA: Diagnosis not present

## 2017-12-23 DIAGNOSIS — M62552 Muscle wasting and atrophy, not elsewhere classified, left thigh: Secondary | ICD-10-CM | POA: Diagnosis not present

## 2017-12-26 DIAGNOSIS — M25662 Stiffness of left knee, not elsewhere classified: Secondary | ICD-10-CM | POA: Diagnosis not present

## 2017-12-26 DIAGNOSIS — M62552 Muscle wasting and atrophy, not elsewhere classified, left thigh: Secondary | ICD-10-CM | POA: Diagnosis not present

## 2017-12-26 DIAGNOSIS — R262 Difficulty in walking, not elsewhere classified: Secondary | ICD-10-CM | POA: Diagnosis not present

## 2017-12-26 DIAGNOSIS — M25562 Pain in left knee: Secondary | ICD-10-CM | POA: Diagnosis not present

## 2017-12-26 DIAGNOSIS — Z471 Aftercare following joint replacement surgery: Secondary | ICD-10-CM | POA: Diagnosis not present

## 2017-12-29 DIAGNOSIS — R262 Difficulty in walking, not elsewhere classified: Secondary | ICD-10-CM | POA: Diagnosis not present

## 2017-12-29 DIAGNOSIS — M25562 Pain in left knee: Secondary | ICD-10-CM | POA: Diagnosis not present

## 2017-12-29 DIAGNOSIS — M25662 Stiffness of left knee, not elsewhere classified: Secondary | ICD-10-CM | POA: Diagnosis not present

## 2017-12-29 DIAGNOSIS — M62552 Muscle wasting and atrophy, not elsewhere classified, left thigh: Secondary | ICD-10-CM | POA: Diagnosis not present

## 2017-12-29 DIAGNOSIS — Z471 Aftercare following joint replacement surgery: Secondary | ICD-10-CM | POA: Diagnosis not present

## 2017-12-30 DIAGNOSIS — R262 Difficulty in walking, not elsewhere classified: Secondary | ICD-10-CM | POA: Diagnosis not present

## 2017-12-30 DIAGNOSIS — Z471 Aftercare following joint replacement surgery: Secondary | ICD-10-CM | POA: Diagnosis not present

## 2017-12-30 DIAGNOSIS — M25662 Stiffness of left knee, not elsewhere classified: Secondary | ICD-10-CM | POA: Diagnosis not present

## 2017-12-30 DIAGNOSIS — M62552 Muscle wasting and atrophy, not elsewhere classified, left thigh: Secondary | ICD-10-CM | POA: Diagnosis not present

## 2017-12-30 DIAGNOSIS — M25562 Pain in left knee: Secondary | ICD-10-CM | POA: Diagnosis not present

## 2018-01-02 DIAGNOSIS — R262 Difficulty in walking, not elsewhere classified: Secondary | ICD-10-CM | POA: Diagnosis not present

## 2018-01-02 DIAGNOSIS — M62552 Muscle wasting and atrophy, not elsewhere classified, left thigh: Secondary | ICD-10-CM | POA: Diagnosis not present

## 2018-01-02 DIAGNOSIS — M25662 Stiffness of left knee, not elsewhere classified: Secondary | ICD-10-CM | POA: Diagnosis not present

## 2018-01-02 DIAGNOSIS — Z471 Aftercare following joint replacement surgery: Secondary | ICD-10-CM | POA: Diagnosis not present

## 2018-01-02 DIAGNOSIS — M25562 Pain in left knee: Secondary | ICD-10-CM | POA: Diagnosis not present

## 2018-01-04 DIAGNOSIS — Z471 Aftercare following joint replacement surgery: Secondary | ICD-10-CM | POA: Diagnosis not present

## 2018-01-04 DIAGNOSIS — M62552 Muscle wasting and atrophy, not elsewhere classified, left thigh: Secondary | ICD-10-CM | POA: Diagnosis not present

## 2018-01-04 DIAGNOSIS — M25662 Stiffness of left knee, not elsewhere classified: Secondary | ICD-10-CM | POA: Diagnosis not present

## 2018-01-04 DIAGNOSIS — R262 Difficulty in walking, not elsewhere classified: Secondary | ICD-10-CM | POA: Diagnosis not present

## 2018-01-04 DIAGNOSIS — M25562 Pain in left knee: Secondary | ICD-10-CM | POA: Diagnosis not present

## 2018-01-06 DIAGNOSIS — M62552 Muscle wasting and atrophy, not elsewhere classified, left thigh: Secondary | ICD-10-CM | POA: Diagnosis not present

## 2018-01-06 DIAGNOSIS — R262 Difficulty in walking, not elsewhere classified: Secondary | ICD-10-CM | POA: Diagnosis not present

## 2018-01-06 DIAGNOSIS — Z471 Aftercare following joint replacement surgery: Secondary | ICD-10-CM | POA: Diagnosis not present

## 2018-01-06 DIAGNOSIS — M25562 Pain in left knee: Secondary | ICD-10-CM | POA: Diagnosis not present

## 2018-01-06 DIAGNOSIS — M25662 Stiffness of left knee, not elsewhere classified: Secondary | ICD-10-CM | POA: Diagnosis not present

## 2018-01-10 DIAGNOSIS — R262 Difficulty in walking, not elsewhere classified: Secondary | ICD-10-CM | POA: Diagnosis not present

## 2018-01-10 DIAGNOSIS — M62552 Muscle wasting and atrophy, not elsewhere classified, left thigh: Secondary | ICD-10-CM | POA: Diagnosis not present

## 2018-01-10 DIAGNOSIS — M25562 Pain in left knee: Secondary | ICD-10-CM | POA: Diagnosis not present

## 2018-01-10 DIAGNOSIS — M25662 Stiffness of left knee, not elsewhere classified: Secondary | ICD-10-CM | POA: Diagnosis not present

## 2018-01-10 DIAGNOSIS — Z471 Aftercare following joint replacement surgery: Secondary | ICD-10-CM | POA: Diagnosis not present

## 2018-01-11 ENCOUNTER — Ambulatory Visit (INDEPENDENT_AMBULATORY_CARE_PROVIDER_SITE_OTHER): Payer: PPO | Admitting: Orthopaedic Surgery

## 2018-01-11 ENCOUNTER — Encounter (INDEPENDENT_AMBULATORY_CARE_PROVIDER_SITE_OTHER): Payer: Self-pay | Admitting: Orthopaedic Surgery

## 2018-01-11 DIAGNOSIS — Z96652 Presence of left artificial knee joint: Secondary | ICD-10-CM

## 2018-01-11 DIAGNOSIS — M25561 Pain in right knee: Secondary | ICD-10-CM

## 2018-01-11 MED ORDER — MELOXICAM 15 MG PO TABS: 15.0000 mg | ORAL_TABLET | Freq: Every day | ORAL | 1 refills | Status: DC

## 2018-01-11 MED ORDER — LIDOCAINE HCL 1 % IJ SOLN
3.0000 mL | INTRAMUSCULAR | Status: AC | PRN
  Administered 2018-01-11: 3 mL

## 2018-01-11 MED ORDER — METHYLPREDNISOLONE ACETATE 40 MG/ML IJ SUSP
40.0000 mg | INTRAMUSCULAR | Status: AC | PRN
  Administered 2018-01-11: 40 mg via INTRA_ARTICULAR

## 2018-01-11 NOTE — Progress Notes (Signed)
Office Visit Note   Patient: Lauren [name]           Date of Birth: 10/17/1946           MRN: [id number] Visit Date: 01/11/2018              Requested by: Lawerance Cruel, [place], [place] [address] PCP: Lawerance Cruel, MD   Assessment & Plan: Visit Diagnoses:  1. Acute pain of right knee   2. Status post total left knee replacement     Plan: I will send her in meloxicam to try a 15 mg dose daily for inflammation and pain.  She will continue her hydrocodone  to take care of the severe pain which I agree with her being on since she is still only 6 weeks status post this extensive knee replacement.  I did provide a steroid injection per her wishes in the right knee.  We will reevaluate in 4 weeks see how she is doing overall.  No x-rays are needed.  Follow-Up Instructions: Return in about 4 weeks (around 02/08/2018).   Orders:  Orders Placed This Encounter  Procedures  . Large Joint Inj   Meds ordered this encounter  Medications  . meloxicam (MOBIC) 15 MG tablet    Sig: Take 1 tablet (15 mg total) by mouth daily.    Dispense:  30 tablet    Refill:  1      Procedures: Large Joint Inj: R knee on 01/11/2018 10:36 AM Indications: diagnostic evaluation and pain Details: 22 G 1.5 in needle, superolateral approach  Arthrogram: No  Medications: 3 mL lidocaine 1 %; 40 mg methylPREDNISolone acetate 40 MG/ML Outcome: tolerated well, no immediate complications Procedure, treatment alternatives, risks and benefits explained, specific risks discussed. Consent was given by the patient. Immediately prior to procedure a time out was called to verify the correct patient, procedure, equipment, support staff and site/side marked as required. Patient was prepped and draped in the usual sterile fashion.       Clinical Data: No additional findings.   Subjective: Chief Complaint  Patient presents with  . Left Knee - Follow-up  The patient is now 40  days status post a left total knee arthroplasty.  She is been having right knee pain and would like to have a steroid injection in her right knee today that will help get her through therapy.  She says she is pleased but her extension and her flexion is got a couple times to 100 degrees.  She does not tolerate anti-inflammatories but is willing to try a daily meloxicam.  HPI  Review of Systems She currently denies any fever, chills, nausea, vomiting  Objective: Vital Signs: There were no vitals taken for this visit.  Physical Exam She is alert and oriented x3 and in no acute distress Ortho Exam Examination of her left knee shows full extension.  I can flex her once to about 95-100 degrees.  Her right knee has patellofemoral crepitation and grinding on range of motion.  It is painful on the right side. Specialty Comments:  No specialty comments available.  Imaging: No results found.   PMFS History: Patient Active Problem List   Diagnosis Date Noted  . 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/02/2017  . Trochanteric bursitis, right hip 05/10/2017  . Primary osteoarthritis of left knee 03/09/2017  . Unilateral primary osteoarthritis, right hip 11/26/2016  . Status post total replacement of right hip 11/26/2016  . Hiatal  hernia 04/13/2013  . Type II or unspecified type diabetes mellitus without mention of complication, uncontrolled 01/26/2013  . Umbilical hernia 52/77/8242  . Hypertension    Past Medical History:  Diagnosis Date  . Arthritis    Back, Hip- right  . Diabetes mellitus without complication ([place])    Type II  . Endometriosis   . Family history of adverse reaction to anesthesia    Father - N/V - blood pressure; daughter N/V  . GERD (gastroesophageal reflux disease)   . History of kidney stones   . History of ulcerative colitis   . Hypertension   . Insomnia   . 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 Neuropathy   . Pneumonia    1983, 2003  . Umbilical hernia   .  Vitamin D deficiency     Family History  Problem Relation Age of Onset  . Hypertension Father   . Heart disease Father   . Diabetes Father   . Cancer Father        colon, prostate, and kidney  . Hypertension Mother   . Parkinson's disease Mother   . Heart disease Maternal Grandmother   . Diabetes Maternal Grandmother   . Heart disease Maternal Grandfather   . Diabetes Maternal Grandfather   . Cancer Maternal Grandfather        pt unaware of what kind  . Stroke Paternal Grandmother   . Stroke Paternal Grandfather     Past Surgical History:  Procedure Laterality Date  . BREAST BIOPSY  08-13-2009  DR TSUEI   EXCISION LEFT NIPPLE DUCT  . CHOLECYSTECTOMY  1992  . COLON RESECTION  1986   ULCERATIVE COLITIS  . COLON SURGERY    . ESOPHAGEAL MANOMETRY N/A 06/11/2013   Procedure: ESOPHAGEAL MANOMETRY (EM);  Surgeon: Jeryl Columbia, MD;  Location: [facility];  Service: Endoscopy;  Laterality: N/A;  . ESOPHAGOGASTRODUODENOSCOPY (EGD) WITH PROPOFOL N/A 05/31/2016   Procedure: ESOPHAGOGASTRODUODENOSCOPY (EGD) WITH PROPOFOL;  Surgeon: Clarene Essex, MD;  Location: [facility];  Service: Endoscopy;  Laterality: N/A;  . EXCISION RIGHT BREAST MASS   10-20-2005  DR Marylene Buerger  . kidney stone removed  2/11  . KNEE ARTHROSCOPY Left    MCL  . LEFT THUMB CARPOMETACARPAL JOINT SUSPENSIONPLASTY  04-06-2006  DR Burney Gauze  . LEFT WRIST ARTHROSCOPY W/ DEBRIDEMENT AND REMOVAL CYST  03-26-2004  DR Burney Gauze  . MYOMECTOMY    . RIGHT COLECTOMY    . RIGHT URETEROSCOPIC STONE EXTRACTION  12-11-2009  DR Irine Seal  . TENDON RELEASE    . TOTAL HIP ARTHROPLASTY Right 11/26/2016   Procedure: RIGHT TOTAL HIP ARTHROPLASTY ANTERIOR APPROACH;  Surgeon: Mcarthur Rossetti, MD;  Location: [facility];  Service: Orthopedics;  Laterality: Right;  . TOTAL KNEE ARTHROPLASTY Left 12/02/2017   Procedure: LEFT TOTAL KNEE ARTHROPLASTY;  Surgeon: Mcarthur Rossetti, MD;  Location: [facility];  Service: Orthopedics;  Laterality:  Left;  . WRIST SURGERY     both   Social History   Occupational History  . Not on file  Tobacco Use  . Smoking status: Former Smoker    Packs/day: 1.00    Years: 10.00    Pack years: 10.00    Types: Cigarettes    Last attempt to quit: 09/22/1982    Years since quitting: 35.3  . Smokeless tobacco: Never Used  . Tobacco comment: quit 1983  Substance and Sexual Activity  . Alcohol use: No  . Drug use: No  . Sexual activity:  Yes    Partners: Male    Birth control/protection: Post-menopausal

## 2018-01-12 ENCOUNTER — Ambulatory Visit (INDEPENDENT_AMBULATORY_CARE_PROVIDER_SITE_OTHER): Payer: PPO | Admitting: Orthopaedic Surgery

## 2018-01-13 DIAGNOSIS — M25662 Stiffness of left knee, not elsewhere classified: Secondary | ICD-10-CM | POA: Diagnosis not present

## 2018-01-13 DIAGNOSIS — M62552 Muscle wasting and atrophy, not elsewhere classified, left thigh: Secondary | ICD-10-CM | POA: Diagnosis not present

## 2018-01-13 DIAGNOSIS — R262 Difficulty in walking, not elsewhere classified: Secondary | ICD-10-CM | POA: Diagnosis not present

## 2018-01-13 DIAGNOSIS — M25562 Pain in left knee: Secondary | ICD-10-CM | POA: Diagnosis not present

## 2018-01-13 DIAGNOSIS — Z471 Aftercare following joint replacement surgery: Secondary | ICD-10-CM | POA: Diagnosis not present

## 2018-01-16 DIAGNOSIS — M25562 Pain in left knee: Secondary | ICD-10-CM | POA: Diagnosis not present

## 2018-01-16 DIAGNOSIS — M62552 Muscle wasting and atrophy, not elsewhere classified, left thigh: Secondary | ICD-10-CM | POA: Diagnosis not present

## 2018-01-16 DIAGNOSIS — M25662 Stiffness of left knee, not elsewhere classified: Secondary | ICD-10-CM | POA: Diagnosis not present

## 2018-01-16 DIAGNOSIS — Z471 Aftercare following joint replacement surgery: Secondary | ICD-10-CM | POA: Diagnosis not present

## 2018-01-16 DIAGNOSIS — R262 Difficulty in walking, not elsewhere classified: Secondary | ICD-10-CM | POA: Diagnosis not present

## 2018-01-18 DIAGNOSIS — G894 Chronic pain syndrome: Secondary | ICD-10-CM | POA: Diagnosis not present

## 2018-01-18 DIAGNOSIS — M25562 Pain in left knee: Secondary | ICD-10-CM | POA: Diagnosis not present

## 2018-01-18 DIAGNOSIS — Z79899 Other long term (current) drug therapy: Secondary | ICD-10-CM | POA: Diagnosis not present

## 2018-01-18 DIAGNOSIS — M150 Primary generalized (osteo)arthritis: Secondary | ICD-10-CM | POA: Diagnosis not present

## 2018-01-20 DIAGNOSIS — M25662 Stiffness of left knee, not elsewhere classified: Secondary | ICD-10-CM | POA: Diagnosis not present

## 2018-01-20 DIAGNOSIS — Z471 Aftercare following joint replacement surgery: Secondary | ICD-10-CM | POA: Diagnosis not present

## 2018-01-20 DIAGNOSIS — M25562 Pain in left knee: Secondary | ICD-10-CM | POA: Diagnosis not present

## 2018-01-20 DIAGNOSIS — R262 Difficulty in walking, not elsewhere classified: Secondary | ICD-10-CM | POA: Diagnosis not present

## 2018-01-20 DIAGNOSIS — M62552 Muscle wasting and atrophy, not elsewhere classified, left thigh: Secondary | ICD-10-CM | POA: Diagnosis not present

## 2018-01-23 DIAGNOSIS — M62552 Muscle wasting and atrophy, not elsewhere classified, left thigh: Secondary | ICD-10-CM | POA: Diagnosis not present

## 2018-01-23 DIAGNOSIS — Z471 Aftercare following joint replacement surgery: Secondary | ICD-10-CM | POA: Diagnosis not present

## 2018-01-23 DIAGNOSIS — M25662 Stiffness of left knee, not elsewhere classified: Secondary | ICD-10-CM | POA: Diagnosis not present

## 2018-01-23 DIAGNOSIS — R262 Difficulty in walking, not elsewhere classified: Secondary | ICD-10-CM | POA: Diagnosis not present

## 2018-01-23 DIAGNOSIS — M25562 Pain in left knee: Secondary | ICD-10-CM | POA: Diagnosis not present

## 2018-01-25 DIAGNOSIS — M62552 Muscle wasting and atrophy, not elsewhere classified, left thigh: Secondary | ICD-10-CM | POA: Diagnosis not present

## 2018-01-25 DIAGNOSIS — M25662 Stiffness of left knee, not elsewhere classified: Secondary | ICD-10-CM | POA: Diagnosis not present

## 2018-01-25 DIAGNOSIS — Z471 Aftercare following joint replacement surgery: Secondary | ICD-10-CM | POA: Diagnosis not present

## 2018-01-25 DIAGNOSIS — M25562 Pain in left knee: Secondary | ICD-10-CM | POA: Diagnosis not present

## 2018-01-25 DIAGNOSIS — R262 Difficulty in walking, not elsewhere classified: Secondary | ICD-10-CM | POA: Diagnosis not present

## 2018-01-27 DIAGNOSIS — M25662 Stiffness of left knee, not elsewhere classified: Secondary | ICD-10-CM | POA: Diagnosis not present

## 2018-01-27 DIAGNOSIS — M25562 Pain in left knee: Secondary | ICD-10-CM | POA: Diagnosis not present

## 2018-01-27 DIAGNOSIS — R262 Difficulty in walking, not elsewhere classified: Secondary | ICD-10-CM | POA: Diagnosis not present

## 2018-01-27 DIAGNOSIS — M62552 Muscle wasting and atrophy, not elsewhere classified, left thigh: Secondary | ICD-10-CM | POA: Diagnosis not present

## 2018-01-27 DIAGNOSIS — Z471 Aftercare following joint replacement surgery: Secondary | ICD-10-CM | POA: Diagnosis not present

## 2018-01-30 DIAGNOSIS — M25562 Pain in left knee: Secondary | ICD-10-CM | POA: Diagnosis not present

## 2018-01-30 DIAGNOSIS — R262 Difficulty in walking, not elsewhere classified: Secondary | ICD-10-CM | POA: Diagnosis not present

## 2018-01-30 DIAGNOSIS — M25662 Stiffness of left knee, not elsewhere classified: Secondary | ICD-10-CM | POA: Diagnosis not present

## 2018-01-30 DIAGNOSIS — M62552 Muscle wasting and atrophy, not elsewhere classified, left thigh: Secondary | ICD-10-CM | POA: Diagnosis not present

## 2018-01-30 DIAGNOSIS — Z471 Aftercare following joint replacement surgery: Secondary | ICD-10-CM | POA: Diagnosis not present

## 2018-02-01 DIAGNOSIS — H353111 Nonexudative age-related macular degeneration, right eye, early dry stage: Secondary | ICD-10-CM | POA: Diagnosis not present

## 2018-02-01 DIAGNOSIS — E119 Type 2 diabetes mellitus without complications: Secondary | ICD-10-CM | POA: Diagnosis not present

## 2018-02-01 DIAGNOSIS — H524 Presbyopia: Secondary | ICD-10-CM | POA: Diagnosis not present

## 2018-02-01 DIAGNOSIS — H5213 Myopia, bilateral: Secondary | ICD-10-CM | POA: Diagnosis not present

## 2018-02-01 DIAGNOSIS — H353121 Nonexudative age-related macular degeneration, left eye, early dry stage: Secondary | ICD-10-CM | POA: Diagnosis not present

## 2018-02-03 DIAGNOSIS — R262 Difficulty in walking, not elsewhere classified: Secondary | ICD-10-CM | POA: Diagnosis not present

## 2018-02-03 DIAGNOSIS — M62552 Muscle wasting and atrophy, not elsewhere classified, left thigh: Secondary | ICD-10-CM | POA: Diagnosis not present

## 2018-02-03 DIAGNOSIS — M25562 Pain in left knee: Secondary | ICD-10-CM | POA: Diagnosis not present

## 2018-02-03 DIAGNOSIS — M25662 Stiffness of left knee, not elsewhere classified: Secondary | ICD-10-CM | POA: Diagnosis not present

## 2018-02-03 DIAGNOSIS — Z471 Aftercare following joint replacement surgery: Secondary | ICD-10-CM | POA: Diagnosis not present

## 2018-02-07 DIAGNOSIS — R262 Difficulty in walking, not elsewhere classified: Secondary | ICD-10-CM | POA: Diagnosis not present

## 2018-02-07 DIAGNOSIS — M62552 Muscle wasting and atrophy, not elsewhere classified, left thigh: Secondary | ICD-10-CM | POA: Diagnosis not present

## 2018-02-07 DIAGNOSIS — Z471 Aftercare following joint replacement surgery: Secondary | ICD-10-CM | POA: Diagnosis not present

## 2018-02-07 DIAGNOSIS — M25662 Stiffness of left knee, not elsewhere classified: Secondary | ICD-10-CM | POA: Diagnosis not present

## 2018-02-07 DIAGNOSIS — M25562 Pain in left knee: Secondary | ICD-10-CM | POA: Diagnosis not present

## 2018-02-13 ENCOUNTER — Ambulatory Visit (INDEPENDENT_AMBULATORY_CARE_PROVIDER_SITE_OTHER): Payer: PPO | Admitting: Orthopaedic Surgery

## 2018-02-13 ENCOUNTER — Encounter (INDEPENDENT_AMBULATORY_CARE_PROVIDER_SITE_OTHER): Payer: Self-pay | Admitting: Orthopaedic Surgery

## 2018-02-13 DIAGNOSIS — Z96641 Presence of right artificial hip joint: Secondary | ICD-10-CM

## 2018-02-13 DIAGNOSIS — Z96652 Presence of left artificial knee joint: Secondary | ICD-10-CM

## 2018-02-13 NOTE — Progress Notes (Signed)
The patient is now 9-10 weeks status post a left total knee arthroplasty.  This will be her last week of outpatient therapy.  She feels like she is making progress.  I injected her right knee at her last visit and is doing better.  She says her knee does pop on the right knee.  She is also had a right total hip arthroplasty that we did 15 months ago.  She is ambulate without assistive device.  On examination of her left operative knee her extension is full and her flexion is to about 110 degrees.  The knee feels ligamentously stable.  There is only mild swelling in the incision is well-healed.  At this point should continue increase her activities as comfort allows.  I would like to see her back in about 4 months.  At that visit I would like an AP and lateral of her left knee.  All questions concerns were answered and addressed.

## 2018-02-14 DIAGNOSIS — R262 Difficulty in walking, not elsewhere classified: Secondary | ICD-10-CM | POA: Diagnosis not present

## 2018-02-14 DIAGNOSIS — M25662 Stiffness of left knee, not elsewhere classified: Secondary | ICD-10-CM | POA: Diagnosis not present

## 2018-02-14 DIAGNOSIS — M25562 Pain in left knee: Secondary | ICD-10-CM | POA: Diagnosis not present

## 2018-02-14 DIAGNOSIS — Z471 Aftercare following joint replacement surgery: Secondary | ICD-10-CM | POA: Diagnosis not present

## 2018-02-14 DIAGNOSIS — M62552 Muscle wasting and atrophy, not elsewhere classified, left thigh: Secondary | ICD-10-CM | POA: Diagnosis not present

## 2018-02-15 DIAGNOSIS — G894 Chronic pain syndrome: Secondary | ICD-10-CM | POA: Diagnosis not present

## 2018-02-15 DIAGNOSIS — Z79891 Long term (current) use of opiate analgesic: Secondary | ICD-10-CM | POA: Diagnosis not present

## 2018-02-15 DIAGNOSIS — Z79899 Other long term (current) drug therapy: Secondary | ICD-10-CM | POA: Diagnosis not present

## 2018-02-15 DIAGNOSIS — M150 Primary generalized (osteo)arthritis: Secondary | ICD-10-CM | POA: Diagnosis not present

## 2018-02-15 DIAGNOSIS — M25562 Pain in left knee: Secondary | ICD-10-CM | POA: Diagnosis not present

## 2018-02-20 DIAGNOSIS — Z471 Aftercare following joint replacement surgery: Secondary | ICD-10-CM | POA: Diagnosis not present

## 2018-02-20 DIAGNOSIS — M62552 Muscle wasting and atrophy, not elsewhere classified, left thigh: Secondary | ICD-10-CM | POA: Diagnosis not present

## 2018-02-20 DIAGNOSIS — R262 Difficulty in walking, not elsewhere classified: Secondary | ICD-10-CM | POA: Diagnosis not present

## 2018-02-20 DIAGNOSIS — M25562 Pain in left knee: Secondary | ICD-10-CM | POA: Diagnosis not present

## 2018-02-20 DIAGNOSIS — M25662 Stiffness of left knee, not elsewhere classified: Secondary | ICD-10-CM | POA: Diagnosis not present

## 2018-02-27 DIAGNOSIS — E1165 Type 2 diabetes mellitus with hyperglycemia: Secondary | ICD-10-CM | POA: Diagnosis not present

## 2018-03-15 DIAGNOSIS — M150 Primary generalized (osteo)arthritis: Secondary | ICD-10-CM | POA: Diagnosis not present

## 2018-03-15 DIAGNOSIS — M25562 Pain in left knee: Secondary | ICD-10-CM | POA: Diagnosis not present

## 2018-03-15 DIAGNOSIS — Z79899 Other long term (current) drug therapy: Secondary | ICD-10-CM | POA: Diagnosis not present

## 2018-03-15 DIAGNOSIS — G894 Chronic pain syndrome: Secondary | ICD-10-CM | POA: Diagnosis not present

## 2018-03-30 DIAGNOSIS — Z79899 Other long term (current) drug therapy: Secondary | ICD-10-CM | POA: Diagnosis not present

## 2018-03-30 DIAGNOSIS — M150 Primary generalized (osteo)arthritis: Secondary | ICD-10-CM | POA: Diagnosis not present

## 2018-03-30 DIAGNOSIS — M25562 Pain in left knee: Secondary | ICD-10-CM | POA: Diagnosis not present

## 2018-03-30 DIAGNOSIS — G894 Chronic pain syndrome: Secondary | ICD-10-CM | POA: Diagnosis not present

## 2018-04-21 ENCOUNTER — Other Ambulatory Visit: Payer: Self-pay | Admitting: Cardiovascular Disease

## 2018-05-11 DIAGNOSIS — M150 Primary generalized (osteo)arthritis: Secondary | ICD-10-CM | POA: Diagnosis not present

## 2018-05-11 DIAGNOSIS — M25562 Pain in left knee: Secondary | ICD-10-CM | POA: Diagnosis not present

## 2018-05-11 DIAGNOSIS — G894 Chronic pain syndrome: Secondary | ICD-10-CM | POA: Diagnosis not present

## 2018-05-11 DIAGNOSIS — Z79899 Other long term (current) drug therapy: Secondary | ICD-10-CM | POA: Diagnosis not present

## 2018-06-26 ENCOUNTER — Encounter (INDEPENDENT_AMBULATORY_CARE_PROVIDER_SITE_OTHER): Payer: Self-pay | Admitting: Orthopaedic Surgery

## 2018-06-26 ENCOUNTER — Ambulatory Visit (INDEPENDENT_AMBULATORY_CARE_PROVIDER_SITE_OTHER): Payer: PPO

## 2018-06-26 ENCOUNTER — Ambulatory Visit (INDEPENDENT_AMBULATORY_CARE_PROVIDER_SITE_OTHER): Payer: PPO | Admitting: Orthopaedic Surgery

## 2018-06-26 DIAGNOSIS — M7061 Trochanteric bursitis, right hip: Secondary | ICD-10-CM | POA: Diagnosis not present

## 2018-06-26 DIAGNOSIS — Z96652 Presence of left artificial knee joint: Secondary | ICD-10-CM

## 2018-06-26 MED ORDER — METHYLPREDNISOLONE ACETATE 40 MG/ML IJ SUSP
40.0000 mg | INTRAMUSCULAR | Status: AC | PRN
  Administered 2018-06-26: 40 mg via INTRA_ARTICULAR

## 2018-06-26 MED ORDER — LIDOCAINE HCL 1 % IJ SOLN
3.0000 mL | INTRAMUSCULAR | Status: AC | PRN
  Administered 2018-06-26: 3 mL

## 2018-06-26 NOTE — Progress Notes (Signed)
Office Visit Note   Patient: Lauren [name]           Date of Birth: [birth year]           MRN: [id number] Visit Date: 06/26/2018              Requested by: Lawerance Cruel, [place], [place] [address] PCP: Lawerance Cruel, MD   Assessment & Plan: Visit Diagnoses:  1. Status post total left knee replacement   2. Trochanteric bursitis, right hip     Plan: I did share with her stretching exercises for trochanteric bursitis of her right hip and she demonstrated these back to me well.  She has enough pain that I have recommended a steroid injection of the trochanteric area and she agrees with this.  She tolerated it well.  I shared with her her knee x-rays as well.  I would like to see her back in about 6 months to see how she is doing overall but no x-rays are needed  Follow-Up Instructions: Return in about 6 months (around 12/27/2018).   Orders:  Orders Placed This Encounter  Procedures  . Large Joint Inj  . XR Knee 1-2 Views Left   No orders of the defined types were placed in this encounter.     Procedures: Large Joint Inj: R greater trochanter on 06/26/2018 10:47 AM Indications: pain and diagnostic evaluation Details: 22 G 1.5 in needle, lateral approach  Arthrogram: No  Medications: 3 mL lidocaine 1 %; 40 mg methylPREDNISolone acetate 40 MG/ML Outcome: tolerated well, no immediate complications Procedure, treatment alternatives, risks and benefits explained, specific risks discussed. Consent was given by the patient. Immediately prior to procedure a time out was called to verify the correct patient, procedure, equipment, support staff and site/side marked as required. Patient was prepped and draped in the usual sterile fashion.       Clinical Data: No additional findings.   Subjective: Chief Complaint  Patient presents with  . Left Knee - Follow-up  The patient is now 7 months status post a left total knee arthroplasty.  She says  she is very pleased with the left knee in terms of her progress and activity.  She has a history of a right total hip arthroplasty that we performed remotely.  She is been having some pain over the trochanteric area of her right hip.  She does have some pain she is gets down her left knee but does again feel like she is doing better overall.  HPI  Review of Systems She currently denies any headache, chest pain, shortness of breath, fever, chills, nausea, vomiting.  Objective: Vital Signs: There were no vitals taken for this visit.  Physical Exam She is alert and oriented x3 and in no acute distress Ortho Exam Examination of her left operative knee shows well-healed surgical incision.  Her extension and flexion are full.  The knee feels ligamentously stable and there is no effusion.  Examination of her right hip shows that it does move fluidly with no issues.  She has significant pain of the trochanteric area of her hip as well. Specialty Comments:  No specialty comments available.  Imaging: Xr Knee 1-2 Views Left  Result Date: 06/26/2018 2 views of the left knee show a total knee arthroplasty with no complicating features.    PMFS History: Patient Active Problem List   Diagnosis Date Noted  . 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/02/2017  . Trochanteric bursitis,  right hip 05/10/2017  . Primary osteoarthritis of left knee 03/09/2017  . Unilateral primary osteoarthritis, right hip 11/26/2016  . Status post total replacement of right hip 11/26/2016  . Hiatal hernia 04/13/2013  . Type II or unspecified type diabetes mellitus without mention of complication, uncontrolled 01/26/2013  . Umbilical hernia 44/12/4740  . Hypertension    Past Medical History:  Diagnosis Date  . Arthritis    Back, Hip- right  . Diabetes mellitus without complication ([place])    Type II  . Endometriosis   . Family history of adverse reaction to anesthesia    Father - N/V - blood pressure; daughter  N/V  . GERD (gastroesophageal reflux disease)   . History of kidney stones   . History of ulcerative colitis   . Hypertension   . Insomnia   . 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 Neuropathy   . Pneumonia    1983, 2003  . Umbilical hernia   . Vitamin D deficiency     Family History  Problem Relation Age of Onset  . Hypertension Father   . Heart disease Father   . Diabetes Father   . Cancer Father        colon, prostate, and kidney  . Hypertension Mother   . Parkinson's disease Mother   . Heart disease Maternal Grandmother   . Diabetes Maternal Grandmother   . Heart disease Maternal Grandfather   . Diabetes Maternal Grandfather   . Cancer Maternal Grandfather        pt unaware of what kind  . Stroke Paternal Grandmother   . Stroke Paternal Grandfather     Past Surgical History:  Procedure Laterality Date  . BREAST BIOPSY  08-13-2009  DR TSUEI   EXCISION LEFT NIPPLE DUCT  . CHOLECYSTECTOMY  1992  . COLON RESECTION  1986   ULCERATIVE COLITIS  . COLON SURGERY    . ESOPHAGEAL MANOMETRY N/A 06/11/2013   Procedure: ESOPHAGEAL MANOMETRY (EM);  Surgeon: Jeryl Columbia, MD;  Location: [facility];  Service: Endoscopy;  Laterality: N/A;  . ESOPHAGOGASTRODUODENOSCOPY (EGD) WITH PROPOFOL N/A 05/31/2016   Procedure: ESOPHAGOGASTRODUODENOSCOPY (EGD) WITH PROPOFOL;  Surgeon: Clarene Essex, MD;  Location: [facility] ENDOSCOPY;  Service: Endoscopy;  Laterality: N/A;  . EXCISION RIGHT BREAST MASS   10-20-2005  DR Marylene Buerger  . kidney stone removed  2/11  . KNEE ARTHROSCOPY Left    MCL  . LEFT THUMB CARPOMETACARPAL JOINT SUSPENSIONPLASTY  04-06-2006  DR Burney Gauze  . LEFT WRIST ARTHROSCOPY W/ DEBRIDEMENT AND REMOVAL CYST  03-26-2004  DR Burney Gauze  . MYOMECTOMY    . RIGHT COLECTOMY    . RIGHT URETEROSCOPIC STONE EXTRACTION  12-11-2009  DR Irine Seal  . TENDON RELEASE    . TOTAL HIP ARTHROPLASTY Right 11/26/2016   Procedure: RIGHT TOTAL HIP ARTHROPLASTY ANTERIOR APPROACH;  Surgeon: Mcarthur Rossetti,  MD;  Location: [facility];  Service: Orthopedics;  Laterality: Right;  . TOTAL KNEE ARTHROPLASTY Left 12/02/2017   Procedure: LEFT TOTAL KNEE ARTHROPLASTY;  Surgeon: Mcarthur Rossetti, MD;  Location: [facility];  Service: Orthopedics;  Laterality: Left;  . WRIST SURGERY     both   Social History   Occupational History  . Not on file  Tobacco Use  . Smoking status: Former Smoker    Packs/day: 1.00    Years: 10.00    Pack years: 10.00    Types: Cigarettes    Last attempt to quit: 09/22/1982    Years since quitting:  35.7  . Smokeless tobacco: Never Used  . Tobacco comment: quit 1983  Substance and Sexual Activity  . Alcohol use: No  . Drug use: No  . Sexual activity: Yes    Partners: Male    Birth control/protection: Post-menopausal

## 2018-07-11 DIAGNOSIS — M150 Primary generalized (osteo)arthritis: Secondary | ICD-10-CM | POA: Diagnosis not present

## 2018-07-11 DIAGNOSIS — Z79891 Long term (current) use of opiate analgesic: Secondary | ICD-10-CM | POA: Diagnosis not present

## 2018-07-11 DIAGNOSIS — M25562 Pain in left knee: Secondary | ICD-10-CM | POA: Diagnosis not present

## 2018-07-11 DIAGNOSIS — G894 Chronic pain syndrome: Secondary | ICD-10-CM | POA: Diagnosis not present

## 2018-07-11 DIAGNOSIS — Z79899 Other long term (current) drug therapy: Secondary | ICD-10-CM | POA: Diagnosis not present

## 2018-07-21 ENCOUNTER — Other Ambulatory Visit: Payer: Self-pay | Admitting: Family Medicine

## 2018-07-21 DIAGNOSIS — Z1231 Encounter for screening mammogram for malignant neoplasm of breast: Secondary | ICD-10-CM

## 2018-07-24 ENCOUNTER — Ambulatory Visit: Payer: PPO

## 2018-07-29 ENCOUNTER — Other Ambulatory Visit: Payer: Self-pay | Admitting: Cardiovascular Disease

## 2018-08-18 ENCOUNTER — Ambulatory Visit (INDEPENDENT_AMBULATORY_CARE_PROVIDER_SITE_OTHER): Payer: PPO | Admitting: Cardiovascular Disease

## 2018-08-18 ENCOUNTER — Encounter: Payer: Self-pay | Admitting: Cardiovascular Disease

## 2018-08-18 VITALS — BP 126/82 | HR 89 | Ht 64.0 in | Wt 225.0 lb

## 2018-08-18 DIAGNOSIS — I10 Essential (primary) hypertension: Secondary | ICD-10-CM

## 2018-08-18 DIAGNOSIS — E785 Hyperlipidemia, unspecified: Secondary | ICD-10-CM

## 2018-08-18 DIAGNOSIS — R079 Chest pain, unspecified: Secondary | ICD-10-CM | POA: Diagnosis not present

## 2018-08-18 DIAGNOSIS — Z23 Encounter for immunization: Secondary | ICD-10-CM | POA: Diagnosis not present

## 2018-08-18 NOTE — Patient Instructions (Signed)
Medication Instructions:  Your physician recommends that you continue on your current medications as directed. Please refer to the Current Medication list given to you today. If you need a refill on your cardiac medications before your next appointment, please call your pharmacy.   Lab work: none  Testing/Procedures: Your physician has requested that you have a lexiscan myoview. For further information please visit [email or web address]. Please follow instruction sheet, as given.  Follow-Up: At [facility], you and your health needs are our priority.  As part of our continuing mission to provide you with exceptional heart care, we have created designated Provider Care Teams.  These Care Teams include your primary Cardiologist (physician) and Advanced Practice Providers (APPs -  Physician Assistants and Nurse Practitioners) who all work together to provide you with the care you need, when you need it. You will need a follow up appointment in 6 months.  Please call our office 2 months in advance to schedule this appointment  [facility]  or one of the following Advanced Practice Providers on your designated Care Team:   Kerin Ransom, PA-C Roby Lofts, [place] . Sande Rives, PA-C  Any Other Special Instructions Will Be Listed Below (If Applicable). Cardiac Nuclear Scan A cardiac nuclear scan is a test that measures blood flow to the heart when a person is resting and when he or she is exercising. The test looks for problems such as:  Not enough blood reaching a portion of the heart.  The heart muscle not working normally.  You may need this test if:  You have heart disease.  You have had abnormal lab results.  You have had heart surgery or angioplasty.  You have chest pain.  You have shortness of breath.  In this test, a radioactive dye (tracer) is injected into your bloodstream. After the tracer has traveled to your heart, an imaging device is used to measure how much of the  tracer is absorbed by or distributed to various areas of your heart. This procedure is usually done at a hospital and takes 2-4 hours. Tell a health care provider about:  Any allergies you have.  All medicines you are taking, including vitamins, herbs, eye drops, creams, and over-the-counter medicines.  Any problems you or family members have had with the use of anesthetic medicines.  Any blood disorders you have.  Any surgeries you have had.  Any medical conditions you have.  Whether you are pregnant or may be pregnant. What are the risks? Generally, this is a safe procedure. However, problems may occur, including:  Serious chest pain and heart attack. This is only a risk if the stress portion of the test is done.  Rapid heartbeat.  Sensation of warmth in your chest. This usually passes quickly.  What happens before the procedure?  Ask your health care provider about changing or stopping your regular medicines. This is especially important if you are taking diabetes medicines or blood thinners.  Remove your jewelry on the day of the procedure. What happens during the procedure?  An IV tube will be inserted into one of your veins.  Your health care provider will inject a small amount of radioactive tracer through the tube.  You will wait for 20-40 minutes while the tracer travels through your bloodstream.  Your heart activity will be monitored with an electrocardiogram (ECG).  You will lie down on an exam table.  Images of your heart will be taken for about 15-20 minutes.  You may be asked to exercise  on a treadmill or stationary bike. While you exercise, your heart's activity will be monitored with an ECG, and your blood pressure will be checked. If you are unable to exercise, you may be given a medicine to increase blood flow to parts of your heart.  When blood flow to your heart has peaked, a tracer will again be injected through the IV tube.  After 20-40 minutes,  you will get back on the exam table and have more images taken of your heart.  When the procedure is over, your IV tube will be removed. The procedure may vary among health care providers and hospitals. Depending on the type of tracer used, scans may need to be repeated 3-4 hours later. What happens after the procedure?  Unless your health care provider tells you otherwise, you may return to your normal schedule, including diet, activities, and medicines.  Unless your health care provider tells you otherwise, you may increase your fluid intake. This will help flush the contrast dye from your body. Drink enough fluid to keep your urine clear or pale yellow.  It is up to you to get your test results. Ask your health care provider, or the department that is doing the test, when your results will be ready. Summary  A cardiac nuclear scan measures the blood flow to the heart when a person is resting and when he or she is exercising.  You may need this test if you are at risk for heart disease.  Tell your health care provider if you are pregnant.  Unless your health care provider tells you otherwise, increase your fluid intake. This will help flush the contrast dye from your body. Drink enough fluid to keep your urine clear or pale yellow. This information is not intended to replace advice given to you by your health care provider. Make sure you discuss any questions you have with your health care provider. Document Released: 11/12/2004 Document Revised: 10/20/2016 Document Reviewed: 09/26/2013 Elsevier Interactive Patient Education  2017 [employer].

## 2018-08-18 NOTE — Progress Notes (Signed)
Cardiology Office Note   Date:  08/18/2018   ID:  Lauren [name], DOB 1946/04/29, MRN [id number]  PCP:  Lawerance Cruel, MD  Cardiologist:   Skeet Latch, MD   Chief Complaint  Patient presents with  . Follow-up    19 months     Patient ID: Lauren [name] is a 71 y.o. female with asymptomatic coronary calcification, diabetes, hypertension, hyperlipidemia, and family history of CAD who presents for follow up.    She was initially seen 09/2015 for an evaluation of chest pain.  She had been seen in the emergency department  where cardiac enzymes were negative.  D-dimer was elevated, so she underwent CT-A of the chest that was negative for PE. She had a [employer] 11/2016 that revealed LVEF 72% and no ischemia.  Amlodipine was added to her regimen and her blood pressure has been much better controlled since that time.  She underwent right hip replacement 11/2016.  Since her last appointment she also had the left knee replaced 12/2017.  Overall she has felt well.  It is been more challenging to rehab from her knee than the hip.  However, for the last 3 months she has noted occasional episodes of substernal chest pressure.  It occurs somewhat randomly.  Sometimes it happens when she is just sitting at the table.  However she also notes that when she is walking or when she is doing work in [employer].  The episodes last for proximally 1 minute.  They are not associated with shortness of breath, nausea, or diaphoresis.  She has not been exercising as much lately because her husband has been sick from heart failure.  To have a large backyard and like to walk labs when the weather is nice.  She also joined a gym that excepts the Silver sneakers program in hopes to start back walking more soon.  She has not had any lower extremity edema, orthopnea, or PND.  She gets very nervous whenever she has chest pain because her father had recurrent heart attacks and she has 2 paternal uncles who  died suddenly of heart disease.     Past Medical History:  Diagnosis Date  . Arthritis    Back, Hip- right  . Diabetes mellitus without complication ([place])    Type II  . Endometriosis   . Family history of adverse reaction to anesthesia    Father - N/V - blood pressure; daughter N/V  . GERD (gastroesophageal reflux disease)   . History of kidney stones   . History of ulcerative colitis   . Hypertension   . Insomnia   . 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 Neuropathy   . Pneumonia    1983, 2003  . Umbilical hernia   . Vitamin D deficiency     Past Surgical History:  Procedure Laterality Date  . BREAST BIOPSY  08-13-2009  DR TSUEI   EXCISION LEFT NIPPLE DUCT  . CHOLECYSTECTOMY  1992  . COLON RESECTION  1986   ULCERATIVE COLITIS  . COLON SURGERY    . ESOPHAGEAL MANOMETRY N/A 06/11/2013   Procedure: ESOPHAGEAL MANOMETRY (EM);  Surgeon: Jeryl Columbia, MD;  Location: [facility]COPY;  Service: Endoscopy;  Laterality: N/A;  . ESOPHAGOGASTRODUODENOSCOPY (EGD) WITH PROPOFOL N/A 05/31/2016   Procedure: ESOPHAGOGASTRODUODENOSCOPY (EGD) WITH PROPOFOL;  Surgeon: Clarene Essex, MD;  Location: [facility] ENDOSCOPY;  Service: Endoscopy;  Laterality: N/A;  . EXCISION RIGHT BREAST MASS   10-20-2005  DR Collier Salina  YOUNG  . kidney stone removed  2/11  . KNEE ARTHROSCOPY Left    MCL  . LEFT THUMB CARPOMETACARPAL JOINT SUSPENSIONPLASTY  04-06-2006  DR Burney Gauze  . LEFT WRIST ARTHROSCOPY W/ DEBRIDEMENT AND REMOVAL CYST  03-26-2004  DR Burney Gauze  . MYOMECTOMY    . RIGHT COLECTOMY    . RIGHT URETEROSCOPIC STONE EXTRACTION  12-11-2009  DR Irine Seal  . TENDON RELEASE    . TOTAL HIP ARTHROPLASTY Right 11/26/2016   Procedure: RIGHT TOTAL HIP ARTHROPLASTY ANTERIOR APPROACH;  Surgeon: Mcarthur Rossetti, MD;  Location: [facility] OR[facility];  Service: Orthopedics;  Laterality: Right;  . TOTAL KNEE ARTHROPLASTY Left 12/02/2017   Procedure: LEFT TOTAL KNEE ARTHROPLASTY;  Surgeon: Mcarthur Rossetti, MD;  Location: [facility] OR[facility];  Service:  Orthopedics;  Laterality: Left;  . WRIST SURGERY     both     Current Outpatient Medications  Medication Sig Dispense Refill  . amLODipine (NORVASC) 5 MG tablet Take 1 tablet (5 mg total) by mouth daily. SCHEDULE OV FOR FURTHER REFILLS. 30 tablet 0  . aspirin EC 81 MG tablet Take 81 mg by mouth daily.    Marland Kitchen b complex vitamins tablet Take 1 tablet by mouth daily.    . balsalazide (COLAZAL) 750 MG capsule Take 1,500-2,250 mg by mouth See admin instructions. Take 2250 mg by mouth in the morning and take 1500-2250 mg by mouth at bedtime    . Cholecalciferol (HM VITAMIN D3) 2000 units CAPS Take 4,000 Units by mouth daily.    . cholestyramine (QUESTRAN) 4 GM/DOSE powder Take 4 g by mouth daily.     . fluticasone (FLONASE) 50 MCG/ACT nasal spray USE 1-2 SPRAYS IN EACH NOSTRIL DAILY AS NEEDED FOR ALLERGIES  12  . furosemide (LASIX) 20 MG tablet Take 20 mg by mouth daily.     Marland Kitchen gabapentin (NEURONTIN) 300 MG capsule Take 600 mg by mouth at bedtime.   1  . glimepiride (AMARYL) 4 MG tablet Take 4 mg by mouth daily with breakfast.    . HYDROcodone-acetaminophen (NORCO) 10-325 MG tablet Take 1 tablet by mouth every 6 (six) hours as needed.    . lidocaine (ASPERCREME W/LIDOCAINE) 4 % cream Apply 1 application topically daily as needed (for hip pain).     . magnesium gluconate (MAGONATE) 500 MG tablet Take 500 mg by mouth at bedtime.    . metFORMIN (GLUCOPHAGE-XR) 500 MG 24 hr tablet Take 1,000 mg by mouth 2 (two) times daily.     . methocarbamol (ROBAXIN) 500 MG tablet Take 1 tablet (500 mg total) by mouth every 6 (six) hours as needed for muscle spasms. 60 tablet 0  . Misc Natural Products (JOINT HEALTH) CAPS Take 300 mg by mouth daily. Arthrocen 300 mg Supplement    . Oxymetazoline HCl (NASAL SPRAY NA) Place 1-2 sprays into both nostrils at bedtime as needed (for allergies).    . pantoprazole (PROTONIX) 40 MG tablet Take 40 mg by mouth 2 (two) times daily.     . quinapril (ACCUPRIL) 40 MG tablet Take 40  mg by mouth daily.    . rosuvastatin (CRESTOR) 20 MG tablet TAKE 1 TABLET BY MOUTH EVERY DAY (Patient taking differently: TAKE 20 MG BY MOUTH EVERY DAY) 90 tablet 4   No current facility-administered medications for this visit.     Allergies:   Ciprofloxacin hcl; Percocet [oxycodone-acetaminophen]; Penicillins; Sulfa antibiotics; Tape; and Tetanus toxoids    Social History:  The patient  reports that she quit smoking about 35 years  ago. Her smoking use included cigarettes. She has a 10.00 pack-year smoking history. She has never used smokeless tobacco. She reports that she does not drink alcohol or use drugs.   Family History:  The patient's family history includes Cancer in her father and maternal grandfather; Diabetes in her father, maternal grandfather, and maternal grandmother; Heart disease in her father, maternal grandfather, and maternal grandmother; Hypertension in her father and mother; Parkinson's disease in her mother; Stroke in her paternal grandfather and paternal grandmother.    ROS:  Please see the history of present illness.   Otherwise, review of systems are positive for none.   All other systems are reviewed and negative.    PHYSICAL EXAM: VS:  BP 126/82 (BP Location: Right Arm, Patient Position: Sitting, Cuff Size: Large)   Pulse 89   Ht 5\' 4"  (1.626 m)   Wt 225 lb (102.1 kg)   BMI 38.62 kg/m  , BMI Body mass index is 38.62 kg/m. GENERAL:  Well appearing HEENT: Pupils equal round and reactive, fundi not visualized, oral mucosa unremarkable NECK:  No jugular venous distention, waveform within normal limits, carotid upstroke brisk and symmetric, no bruits, no thyromegaly LUNGS:  Clear to auscultation bilaterally HEART:  RRR.  PMI not displaced or sustained,S1 and S2 within normal limits, no S3, no S4, no clicks, no rubs, no murmurs ABD:  Flat, positive bowel sounds normal in frequency in pitch, no bruits, no rebound, no guarding, no midline pulsatile mass, no  hepatomegaly, no splenomegaly EXT:  2 plus pulses throughout, no edema, no cyanosis no clubbing SKIN:  No rashes no nodules NEURO:  Cranial nerves II through XII grossly intact, motor grossly intact throughout PSYCH:  Cognitively intact, oriented to person place and time   EKG:  EKG is not ordered today. The ekg ordered 09/2015 demonstrates sinus rhythm rate 81 bpm.  Lexiscan Myoview 11/19/16: Nuclear stress EF: 72%.  The left ventricular ejection fraction is hyperdynamic (>65%).  There was no ST segment deviation noted during stress.  This is a low risk study. No perfusion defects.  Recent Labs: 12/03/2017: BUN 13; Creatinine, Ser 0.73; Hemoglobin 11.0; Platelets 258; Potassium 4.4; Sodium 134    Lipid Panel No results found for: CHOL, TRIG, HDL, CHOLHDL, VLDL, LDLCALC, LDLDIRECT    Wt Readings from Last 3 Encounters:  08/18/18 225 lb (102.1 kg)  12/02/17 229 lb (103.9 kg)  11/25/17 229 lb 8 oz (104.1 kg)      ASSESSMENT AND PLAN:  # Chest pain:  # Coronary calcification:  Her last stress test 11/2017 was negative for ischemia.  However, she has had new symptoms in the last 3 months.  She has coronary calcifications noted on prior chest CT.  We will get a Lexiscan Myoview to better assess for ischemia.  Continue aspirin and rosuvastatin.  # Hypertension:  Blood pressure is much better-controlled. Continue amlodipine and qunapril.  # Hyperlipidemia: Continue rosuvastatin.  She goes for her annual exam next month and will have lipids checked at that time.  Current medicines are reviewed at length with the patient today.  The patient does not have concerns regarding medicines.  The following changes have been made:  None  Labs/ tests ordered today include:   Orders Placed This Encounter  Procedures  . MYOCARDIAL PERFUSION IMAGING     Disposition:   FU with Lauren [name] C. Oval Linsey, MD in 6 months.    Signed, Skeet Latch, MD  08/18/2018 4:48 PM    [facility]

## 2018-08-25 ENCOUNTER — Ambulatory Visit: Payer: PPO

## 2018-08-28 DIAGNOSIS — E1165 Type 2 diabetes mellitus with hyperglycemia: Secondary | ICD-10-CM | POA: Diagnosis not present

## 2018-08-28 DIAGNOSIS — E7800 Pure hypercholesterolemia, unspecified: Secondary | ICD-10-CM | POA: Diagnosis not present

## 2018-08-28 DIAGNOSIS — E559 Vitamin D deficiency, unspecified: Secondary | ICD-10-CM | POA: Diagnosis not present

## 2018-08-28 DIAGNOSIS — I10 Essential (primary) hypertension: Secondary | ICD-10-CM | POA: Diagnosis not present

## 2018-08-31 DIAGNOSIS — I700 Atherosclerosis of aorta: Secondary | ICD-10-CM | POA: Diagnosis not present

## 2018-08-31 DIAGNOSIS — K219 Gastro-esophageal reflux disease without esophagitis: Secondary | ICD-10-CM | POA: Diagnosis not present

## 2018-08-31 DIAGNOSIS — E1169 Type 2 diabetes mellitus with other specified complication: Secondary | ICD-10-CM | POA: Diagnosis not present

## 2018-08-31 DIAGNOSIS — K5289 Other specified noninfective gastroenteritis and colitis: Secondary | ICD-10-CM | POA: Diagnosis not present

## 2018-08-31 DIAGNOSIS — Z0000 Encounter for general adult medical examination without abnormal findings: Secondary | ICD-10-CM | POA: Diagnosis not present

## 2018-08-31 DIAGNOSIS — E7800 Pure hypercholesterolemia, unspecified: Secondary | ICD-10-CM | POA: Diagnosis not present

## 2018-08-31 DIAGNOSIS — F430 Acute stress reaction: Secondary | ICD-10-CM | POA: Diagnosis not present

## 2018-08-31 DIAGNOSIS — I10 Essential (primary) hypertension: Secondary | ICD-10-CM | POA: Diagnosis not present

## 2018-08-31 DIAGNOSIS — E559 Vitamin D deficiency, unspecified: Secondary | ICD-10-CM | POA: Diagnosis not present

## 2018-09-02 ENCOUNTER — Other Ambulatory Visit: Payer: Self-pay | Admitting: Cardiovascular Disease

## 2018-09-05 ENCOUNTER — Telehealth (HOSPITAL_COMMUNITY): Payer: Self-pay

## 2018-09-05 NOTE — Telephone Encounter (Signed)
Encounter complete. 

## 2018-09-07 ENCOUNTER — Ambulatory Visit (HOSPITAL_COMMUNITY)
Admission: RE | Admit: 2018-09-07 | Discharge: 2018-09-07 | Disposition: A | Discharge status: Home / Self Care | Payer: PPO | Source: Ambulatory Visit | Attending: Cardiology | Admitting: Cardiology

## 2018-09-07 DIAGNOSIS — I10 Essential (primary) hypertension: Secondary | ICD-10-CM | POA: Diagnosis not present

## 2018-09-07 DIAGNOSIS — R079 Chest pain, unspecified: Secondary | ICD-10-CM

## 2018-09-07 MED ORDER — REGADENOSON 0.4 MG/5ML IV SOLN
0.4000 mg | Freq: Once | INTRAVENOUS | Status: AC
  Administered 2018-09-07: 0.4 mg via INTRAVENOUS

## 2018-09-07 MED ORDER — TECHNETIUM TC 99M TETROFOSMIN IV KIT
29.2000 | PACK | Freq: Once | INTRAVENOUS | Status: AC | PRN
  Administered 2018-09-07: 29.2 via INTRAVENOUS
  Filled 2018-09-07: qty 30

## 2018-09-08 ENCOUNTER — Ambulatory Visit (HOSPITAL_COMMUNITY)
Admission: RE | Admit: 2018-09-08 | Discharge: 2018-09-08 | Disposition: A | Discharge status: Home / Self Care | Payer: PPO | Source: Ambulatory Visit | Attending: Cardiology | Admitting: Cardiology

## 2018-09-08 LAB — MYOCARDIAL PERFUSION IMAGING
CSEPPHR: 108 {beats}/min
LV dias vol: 84 mL (ref 46–106)
LVSYSVOL: 20 mL
Rest HR: 78 {beats}/min
SDS: 2
SRS: 1
SSS: 3
TID: 0.75

## 2018-09-08 MED ORDER — TECHNETIUM TC 99M TETROFOSMIN IV KIT
30.4000 | PACK | Freq: Once | INTRAVENOUS | Status: AC | PRN
  Administered 2018-09-08: 30.4 via INTRAVENOUS

## 2018-09-18 DIAGNOSIS — G894 Chronic pain syndrome: Secondary | ICD-10-CM | POA: Diagnosis not present

## 2018-09-18 DIAGNOSIS — M47816 Spondylosis without myelopathy or radiculopathy, lumbar region: Secondary | ICD-10-CM | POA: Diagnosis not present

## 2018-09-18 DIAGNOSIS — Z79899 Other long term (current) drug therapy: Secondary | ICD-10-CM | POA: Diagnosis not present

## 2018-09-18 DIAGNOSIS — M150 Primary generalized (osteo)arthritis: Secondary | ICD-10-CM | POA: Diagnosis not present

## 2018-09-19 ENCOUNTER — Encounter: Payer: Self-pay | Admitting: Women's Health

## 2018-09-19 ENCOUNTER — Ambulatory Visit: Payer: PPO | Admitting: Women's Health

## 2018-09-19 VITALS — BP 132/80

## 2018-09-19 DIAGNOSIS — N898 Other specified noninflammatory disorders of vagina: Secondary | ICD-10-CM | POA: Diagnosis not present

## 2018-09-19 DIAGNOSIS — R350 Frequency of micturition: Secondary | ICD-10-CM | POA: Diagnosis not present

## 2018-09-19 DIAGNOSIS — B373 Candidiasis of vulva and vagina: Secondary | ICD-10-CM | POA: Diagnosis not present

## 2018-09-19 DIAGNOSIS — B3731 Acute candidiasis of vulva and vagina: Secondary | ICD-10-CM

## 2018-09-19 LAB — WET PREP FOR TRICH, YEAST, CLUE

## 2018-09-19 MED ORDER — TERCONAZOLE 0.8 % VA CREA: 1.0000 | TOPICAL_CREAM | Freq: Every day | VAGINAL | 1 refills | Status: DC

## 2018-09-19 NOTE — Progress Notes (Signed)
72 year old MWF G2 P2 presents with complaint of vaginal irritation and mild urinary frequency for the past several days.  Denies visible discharge, pain at end of stream of urination, abdominal / back pain, nausea or fever.  First noticed symptoms after intercourse, does use a vaginal lubricant. Has also been recently started on Jardiance.  Primary care manages hypertension, diabetes, hypercholesteremia and colitis.  12/2017  left total knee and is doing better since surgery.  Increased stress at home, husband dementia is argumentative and does not like to be left alone.  Postmenopausal no HRT with no bleeding.  Exam: Appears well, no CVAT.  Abdomen soft, obese, no rebound or radiation of pain.  External genitalia within normal limits, mild erythema at introitus, speculum exam no visible discharge vaginal walls erythematous, wet prep positive for yeast.  Bimanual no CMT or adnexal tenderness. UA: Negative nitrites, negative leukocytes, +3 glucose, trace blood, no WBCs, no RBCs, no bacteria  Yeast vaginitis Situational stress-husband's health  Plan: Terazol 3 1 applicator at bedtime x3 prescription, proper use given and reviewed.  Instructed to call if no relief of symptoms.  Encourage self-care, increasing leisure activities eliciting help from family with husband's care.

## 2018-09-19 NOTE — Patient Instructions (Signed)

## 2018-09-20 LAB — URINALYSIS, COMPLETE W/RFL CULTURE
BACTERIA UA: NONE SEEN /HPF
Bilirubin Urine: NEGATIVE
HYALINE CAST: NONE SEEN /LPF
KETONES UR: NEGATIVE
Leukocyte Esterase: NEGATIVE
Nitrites, Initial: NEGATIVE
PH: 6 (ref 5.0–8.0)
Protein, ur: NEGATIVE
RBC / HPF: NONE SEEN /HPF (ref 0–2)
SPECIFIC GRAVITY, URINE: 1.01 (ref 1.001–1.03)
WBC UA: NONE SEEN /HPF (ref 0–5)

## 2018-09-20 LAB — NO CULTURE INDICATED

## 2018-09-26 ENCOUNTER — Telehealth: Payer: Self-pay

## 2018-09-26 ENCOUNTER — Other Ambulatory Visit: Payer: Self-pay | Admitting: Women's Health

## 2018-09-26 MED ORDER — FLUCONAZOLE 100 MG PO TABS: ORAL_TABLET | ORAL | 0 refills | Status: DC

## 2018-09-26 NOTE — Telephone Encounter (Signed)
Has a visit 09/19/18. Has used the Terazol 3 cream and is still "having a lot of trouble".  She said she has been using the A&D ointment for the dryness like you recommended. She said she is not comfortable and it kept her awake last night.

## 2018-09-26 NOTE — Telephone Encounter (Signed)
Spoke with patient and advised. Rx sent. 

## 2018-09-26 NOTE — Telephone Encounter (Signed)
Please call, Diflucan 100 mg daily for 5 days.  #5   Have her call if continued problems.

## 2018-10-03 ENCOUNTER — Other Ambulatory Visit: Payer: Self-pay | Admitting: Cardiovascular Disease

## 2018-10-03 DIAGNOSIS — E785 Hyperlipidemia, unspecified: Secondary | ICD-10-CM

## 2018-10-03 NOTE — Telephone Encounter (Signed)
Rx sent to pharmacy   

## 2018-10-04 ENCOUNTER — Ambulatory Visit
Admission: RE | Admit: 2018-10-04 | Discharge: 2018-10-04 | Disposition: A | Discharge status: Home / Self Care | Payer: PPO | Source: Ambulatory Visit | Attending: Family Medicine | Admitting: Family Medicine

## 2018-10-04 DIAGNOSIS — Z1231 Encounter for screening mammogram for malignant neoplasm of breast: Secondary | ICD-10-CM | POA: Diagnosis not present

## 2018-10-06 ENCOUNTER — Other Ambulatory Visit: Payer: Self-pay | Admitting: Family Medicine

## 2018-10-06 DIAGNOSIS — R928 Other abnormal and inconclusive findings on diagnostic imaging of breast: Secondary | ICD-10-CM

## 2018-10-12 ENCOUNTER — Other Ambulatory Visit: Payer: Self-pay | Admitting: Family Medicine

## 2018-10-12 ENCOUNTER — Ambulatory Visit
Admission: RE | Admit: 2018-10-12 | Discharge: 2018-10-12 | Disposition: A | Discharge status: Home / Self Care | Payer: PPO | Source: Ambulatory Visit | Attending: Family Medicine | Admitting: Family Medicine

## 2018-10-12 DIAGNOSIS — R928 Other abnormal and inconclusive findings on diagnostic imaging of breast: Secondary | ICD-10-CM

## 2018-10-12 DIAGNOSIS — N6311 Unspecified lump in the right breast, upper outer quadrant: Secondary | ICD-10-CM | POA: Diagnosis not present

## 2018-10-12 DIAGNOSIS — N6310 Unspecified lump in the right breast, unspecified quadrant: Secondary | ICD-10-CM

## 2018-10-12 DIAGNOSIS — R922 Inconclusive mammogram: Secondary | ICD-10-CM | POA: Diagnosis not present

## 2018-10-18 ENCOUNTER — Other Ambulatory Visit: Payer: Self-pay | Admitting: Family Medicine

## 2018-10-18 ENCOUNTER — Ambulatory Visit
Admission: RE | Admit: 2018-10-18 | Discharge: 2018-10-18 | Disposition: A | Discharge status: Home / Self Care | Payer: PPO | Source: Ambulatory Visit | Attending: Family Medicine | Admitting: Family Medicine

## 2018-10-18 DIAGNOSIS — N6310 Unspecified lump in the right breast, unspecified quadrant: Secondary | ICD-10-CM

## 2018-10-18 DIAGNOSIS — N6311 Unspecified lump in the right breast, upper outer quadrant: Secondary | ICD-10-CM | POA: Diagnosis not present

## 2018-10-18 DIAGNOSIS — C50411 Malignant neoplasm of upper-outer quadrant of right female breast: Secondary | ICD-10-CM | POA: Diagnosis not present

## 2018-11-07 ENCOUNTER — Other Ambulatory Visit: Payer: Self-pay | Admitting: Surgery

## 2018-11-07 DIAGNOSIS — Z853 Personal history of malignant neoplasm of breast: Secondary | ICD-10-CM

## 2018-11-07 DIAGNOSIS — C50911 Malignant neoplasm of unspecified site of right female breast: Secondary | ICD-10-CM | POA: Diagnosis not present

## 2018-11-08 ENCOUNTER — Telehealth: Payer: Self-pay | Admitting: Oncology

## 2018-11-08 ENCOUNTER — Other Ambulatory Visit: Payer: Self-pay

## 2018-11-08 DIAGNOSIS — C50919 Malignant neoplasm of unspecified site of unspecified female breast: Secondary | ICD-10-CM

## 2018-11-08 DIAGNOSIS — Z79899 Other long term (current) drug therapy: Secondary | ICD-10-CM | POA: Diagnosis not present

## 2018-11-08 DIAGNOSIS — M150 Primary generalized (osteo)arthritis: Secondary | ICD-10-CM | POA: Diagnosis not present

## 2018-11-08 DIAGNOSIS — M47816 Spondylosis without myelopathy or radiculopathy, lumbar region: Secondary | ICD-10-CM | POA: Diagnosis not present

## 2018-11-08 DIAGNOSIS — G894 Chronic pain syndrome: Secondary | ICD-10-CM | POA: Diagnosis not present

## 2018-11-08 NOTE — Progress Notes (Signed)
Location of Breast Cancer: Right Breast Cancer  Did patient present with symptoms (if so, please note symptoms) or was this found on screening mammography?: Seen on screening mammogram- small mass at the 10:30 position of the right breast.  Previous benign biopsies on both breasts.  Histology per Pathology Report:   Receptor Status: ER( +100%), PR ( + 20%), Her2-neu (-), Ki-67(5%)   Past/Anticipated interventions by surgeon, if any: Dr. [name] 11/07/2018 -We discussed mastectomy versus breast conservation.  She is a candidate for breast conservation and is very interested in saving her breast. - She will be referred to both medical and radiation oncology. -Surgery scheduled for 11/16/2018- -Seed implant 11/14/2017  Past/Anticipated interventions by medical oncology, if any: Chemotherapy  Dr. Jana Hakim 11/09/2018 4pm  Lymphedema issues, if any: No    Pain issues, if any: No   BP (!) 151/74 (BP Location: Left Arm, Patient Position: Sitting)   Pulse 85   Temp 97.6 F (36.4 C) (Oral)   Resp 18   Ht 5' 4"  (1.626 m)   Wt 217 lb 6.4 oz (98.6 kg)   SpO2 97%   BMI 37.32 kg/m    Wt Readings from Last 3 Encounters:  11/09/18 217 lb 6.4 oz (98.6 kg)  09/07/18 225 lb (102.1 kg)  08/18/18 225 lb (102.1 kg)    SAFETY ISSUES:  Prior radiation? No  Pacemaker/ICD? No  Possible current pregnancy? No  Is the patient on methotrexate? No  Current Complaints / other details:      Cori Razor, RN 11/08/2018,2:20 PM

## 2018-11-08 NOTE — Telephone Encounter (Signed)
Received a new patient appt from Dr. [name] at [place] for breast cancer. Pt has been cld and scheduled the pt to see Dr. Jana Hakim on 1/9 at 4pm. Pt aware to arrive 30 minutes early. Letter mailed.

## 2018-11-09 ENCOUNTER — Encounter: Payer: Self-pay | Admitting: Radiation Oncology

## 2018-11-09 ENCOUNTER — Inpatient Hospital Stay: Payer: PPO

## 2018-11-09 ENCOUNTER — Ambulatory Visit
Admission: RE | Admit: 2018-11-09 | Discharge: 2018-11-09 | Disposition: A | Discharge status: Home / Self Care | Payer: PPO | Source: Ambulatory Visit | Attending: Radiation Oncology | Admitting: Radiation Oncology

## 2018-11-09 ENCOUNTER — Telehealth: Payer: Self-pay | Admitting: Oncology

## 2018-11-09 ENCOUNTER — Other Ambulatory Visit: Payer: Self-pay

## 2018-11-09 ENCOUNTER — Inpatient Hospital Stay: Payer: PPO | Attending: Oncology | Admitting: Oncology

## 2018-11-09 ENCOUNTER — Encounter: Payer: Self-pay | Admitting: Oncology

## 2018-11-09 VITALS — BP 151/74 | HR 85 | Temp 97.6°F | Resp 18 | Ht 64.0 in | Wt 217.4 lb

## 2018-11-09 DIAGNOSIS — Z170 Estrogen receptor positive status [ER+]: Secondary | ICD-10-CM

## 2018-11-09 DIAGNOSIS — E559 Vitamin D deficiency, unspecified: Secondary | ICD-10-CM | POA: Diagnosis not present

## 2018-11-09 DIAGNOSIS — Z79899 Other long term (current) drug therapy: Secondary | ICD-10-CM | POA: Insufficient documentation

## 2018-11-09 DIAGNOSIS — E119 Type 2 diabetes mellitus without complications: Secondary | ICD-10-CM | POA: Diagnosis not present

## 2018-11-09 DIAGNOSIS — G629 Polyneuropathy, unspecified: Secondary | ICD-10-CM | POA: Diagnosis not present

## 2018-11-09 DIAGNOSIS — Z7984 Long term (current) use of oral hypoglycemic drugs: Secondary | ICD-10-CM | POA: Diagnosis not present

## 2018-11-09 DIAGNOSIS — Z8249 Family history of ischemic heart disease and other diseases of the circulatory system: Secondary | ICD-10-CM | POA: Diagnosis not present

## 2018-11-09 DIAGNOSIS — K219 Gastro-esophageal reflux disease without esophagitis: Secondary | ICD-10-CM | POA: Insufficient documentation

## 2018-11-09 DIAGNOSIS — C50411 Malignant neoplasm of upper-outer quadrant of right female breast: Secondary | ICD-10-CM | POA: Diagnosis not present

## 2018-11-09 DIAGNOSIS — Z87891 Personal history of nicotine dependence: Secondary | ICD-10-CM | POA: Insufficient documentation

## 2018-11-09 DIAGNOSIS — G4700 Insomnia, unspecified: Secondary | ICD-10-CM | POA: Diagnosis not present

## 2018-11-09 DIAGNOSIS — I10 Essential (primary) hypertension: Secondary | ICD-10-CM | POA: Diagnosis not present

## 2018-11-09 DIAGNOSIS — Z7982 Long term (current) use of aspirin: Secondary | ICD-10-CM

## 2018-11-09 DIAGNOSIS — C50919 Malignant neoplasm of unspecified site of unspecified female breast: Secondary | ICD-10-CM

## 2018-11-09 DIAGNOSIS — Z87442 Personal history of urinary calculi: Secondary | ICD-10-CM | POA: Diagnosis not present

## 2018-11-09 DIAGNOSIS — M129 Arthropathy, unspecified: Secondary | ICD-10-CM | POA: Diagnosis not present

## 2018-11-09 DIAGNOSIS — E1140 Type 2 diabetes mellitus with diabetic neuropathy, unspecified: Secondary | ICD-10-CM

## 2018-11-09 LAB — CMP (CANCER CENTER ONLY)
ALBUMIN: 4.1 g/dL (ref 3.5–5.0)
ALT: 24 U/L (ref 0–44)
AST: 34 U/L (ref 15–41)
Alkaline Phosphatase: 64 U/L (ref 38–126)
Anion gap: 10 (ref 5–15)
BUN: 14 mg/dL (ref 8–23)
CO2: 27 mmol/L (ref 22–32)
Calcium: 9.9 mg/dL (ref 8.9–10.3)
Chloride: 103 mmol/L (ref 98–111)
Creatinine: 0.96 mg/dL (ref 0.44–1.00)
GFR, Est AFR Am: >60 mL/min (ref >60)
GFR, Estimated: 59 mL/min — ABNORMAL LOW (ref >60)
GLUCOSE: 197 mg/dL — AB (ref 70–99)
Potassium: 4.7 mmol/L (ref 3.5–5.1)
Sodium: 140 mmol/L (ref 135–145)
Total Bilirubin: 0.4 mg/dL (ref 0.3–1.2)
Total Protein: 7.4 g/dL (ref 6.5–8.1)

## 2018-11-09 LAB — CBC WITH DIFFERENTIAL (CANCER CENTER ONLY)
ABS IMMATURE GRANULOCYTES: 0.06 10*3/uL (ref 0.00–0.07)
Basophils Absolute: 0.1 10*3/uL (ref 0.0–0.1)
Basophils Relative: 0 %
Eosinophils Absolute: 0.3 10*3/uL (ref 0.0–0.5)
Eosinophils Relative: 3 %
HCT: 41.4 % (ref 36.0–46.0)
Hemoglobin: 13 g/dL (ref 12.0–15.0)
Immature Granulocytes: 1 %
Lymphocytes Relative: 19 %
Lymphs Abs: 2.2 10*3/uL (ref 0.7–4.0)
MCH: 27.5 pg (ref 26.0–34.0)
MCHC: 31.4 g/dL (ref 30.0–36.0)
MCV: 87.7 fL (ref 80.0–100.0)
MONOS PCT: 10 %
Monocytes Absolute: 1.1 10*3/uL — ABNORMAL HIGH (ref 0.1–1.0)
NEUTROS ABS: 7.6 10*3/uL (ref 1.7–7.7)
Neutrophils Relative %: 67 %
Platelet Count: 300 10*3/uL (ref 150–400)
RBC: 4.72 MIL/uL (ref 3.87–5.11)
RDW: 14.2 % (ref 11.5–15.5)
WBC Count: 11.2 10*3/uL — ABNORMAL HIGH (ref 4.0–10.5)
nRBC: 0 % (ref 0.0–0.2)

## 2018-11-09 NOTE — Progress Notes (Signed)
Radiation Oncology         [phone] ________________________________  Name: Lauren [name]        MRN: [id number]  Date of Service: 11/09/2018 DOB: 02-18-1946  QI:HKVQ, [name], MD  Coralie Keens, MD     REFERRING PHYSICIAN: Coralie Keens, MD   DIAGNOSIS: The encounter diagnosis was Malignant neoplasm of upper-outer quadrant of right breast in female, estrogen receptor positive ([place]).   HISTORY OF PRESENT ILLNESS: Lauren [name] is a 73 y.o. female seen  for a new diagnosis of right breast cancer. 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  Diagnostic imaging revealed a 9 x 9 x 8 mm mass at the 10:30 o'clock position in her axilla was also evaluated and was negative for adenopathy.  She underwent biopsy on 10/18/2018 which revealed a grade 2 invasive ductal carcinoma.  Her tumor was ER PR positive, HER-2 negative, and her Ki-67 was 5%.  She is anticipating breast conservation surgery, and is scheduled to have this performed on Thursday of next week.    PREVIOUS RADIATION THERAPY: No   PAST MEDICAL HISTORY:  Past Medical History:  Diagnosis Date  . Arthritis    Back, Hip- right  . Diabetes mellitus without complication ([place])    Type II  . Endometriosis   . Family history of adverse reaction to anesthesia    Father - N/V - blood pressure; daughter N/V  . GERD (gastroesophageal reflux disease)   . History of kidney stones   . History of ulcerative colitis   . Hypertension   . Insomnia   . 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 Neuropathy   . Pneumonia    1983, 2003  . Umbilical hernia   . Vitamin D deficiency        PAST SURGICAL HISTORY: Past Surgical History:  Procedure Laterality Date  . BREAST BIOPSY  08-13-2009  DR TSUEI   EXCISION LEFT NIPPLE DUCT  . BREAST EXCISIONAL BIOPSY Left    x2  . BREAST EXCISIONAL  BIOPSY Right   . CHOLECYSTECTOMY  1992  . COLON RESECTION  1986   ULCERATIVE COLITIS  . COLON SURGERY    . ESOPHAGEAL MANOMETRY N/A 06/11/2013   Procedure: ESOPHAGEAL MANOMETRY (EM);  Surgeon: Jeryl Columbia, MD;  Location: [facility] END[facility];  Service: Endoscopy;  Laterality: N/A;  . ESOPHAGOGASTRODUODENOSCOPY (EGD) WITH PROPOFOL N/A 05/31/2016   Procedure: ESOPHAGOGASTRODUODENOSCOPY (EGD) WITH PROPOFOL;  Surgeon: Clarene Essex, MD;  Location: [facility] ENDOSCOPY;  Service: Endoscopy;  Laterality: N/A;  . EXCISION RIGHT BREAST MASS   10-20-2005  DR Marylene Buerger  . kidney stone removed  2/11  . KNEE ARTHROSCOPY Left    MCL  . LEFT THUMB CARPOMETACARPAL JOINT SUSPENSIONPLASTY  04-06-2006  DR Burney Gauze  . LEFT WRIST ARTHROSCOPY W/ DEBRIDEMENT AND REMOVAL CYST  03-26-2004  DR Burney Gauze  . MYOMECTOMY    . RIGHT COLECTOMY    . RIGHT URETEROSCOPIC STONE EXTRACTION  12-11-2009  DR Irine Seal  . TENDON RELEASE    . TOTAL HIP ARTHROPLASTY Right 11/26/2016   Procedure: RIGHT TOTAL HIP ARTHROPLASTY ANTERIOR APPROACH;  Surgeon: Mcarthur Rossetti, MD;  Location: [facility] ORS;  Service: Orthopedics;  Laterality: Right;  . TOTAL KNEE ARTHROPLASTY Left 12/02/2017   Procedure: LEFT TOTAL KNEE ARTHROPLASTY;  Surgeon: Mcarthur Rossetti, MD;  Location: [facility] ORS;  Service: Orthopedics;  Laterality: Left;  . WRIST SURGERY     both     FAMILY HISTORY:  Family History  Problem Relation Age of Onset  . Hypertension Father   . Heart disease Father   . Diabetes Father   . Cancer Father        colon, prostate, and kidney  . Hypertension Mother   . Parkinson's disease Mother   . Heart disease Maternal Grandmother   . Diabetes Maternal Grandmother   . Heart disease Maternal Grandfather   . Diabetes Maternal Grandfather   . Cancer Maternal Grandfather        pt unaware of what kind  . Stroke Paternal Grandmother   . Stroke Paternal Grandfather      SOCIAL HISTORY:  reports that she quit smoking about 36 years ago. Her  smoking use included cigarettes. She has a 10.00 pack-year smoking history. She has never used smokeless tobacco. She reports that she does not drink alcohol or use drugs.  The patient is married and lives in [place].   ALLERGIES: Ciprofloxacin hcl; Percocet [oxycodone-acetaminophen]; Penicillins; Sulfa antibiotics; Tape; and Tetanus toxoids   MEDICATIONS:  Current Outpatient Medications  Medication Sig Dispense Refill  . amLODipine (NORVASC) 5 MG tablet TAKE 1 TABLET BY MOUTH ONCE DAILY *NEED  OFFICE  VISIT* (Patient taking differently: Take 5 mg by mouth daily. ) 90 tablet 3  . aspirin EC 81 MG tablet Take 81 mg by mouth daily.    Marland Kitchen b complex vitamins tablet Take 1 tablet by mouth daily.    . balsalazide (COLAZAL) 750 MG capsule Take 2,250 mg by mouth 3 (three) times daily.     . Cholecalciferol (HM VITAMIN D3) 2000 units CAPS Take 2,000 Units by mouth daily.     . cholestyramine (QUESTRAN) 4 GM/DOSE powder Take 4 g by mouth daily.     . furosemide (LASIX) 20 MG tablet Take 20 mg by mouth daily.     Marland Kitchen gabapentin (NEURONTIN) 300 MG capsule Take 600 mg by mouth at bedtime.   1  . HYDROcodone-acetaminophen (NORCO) 10-325 MG tablet Take 1 tablet by mouth 3 (three) times daily as needed for moderate pain.     Marland Kitchen lidocaine (ASPERCREME W/LIDOCAINE) 4 % cream Apply 1 application topically daily as needed (for joint pain).     . magnesium gluconate (MAGONATE) 500 MG tablet Take 500 mg by mouth at bedtime.    . metFORMIN (GLUCOPHAGE-XR) 500 MG 24 hr tablet Take 1,000 mg by mouth 2 (two) times daily.     . methocarbamol (ROBAXIN) 500 MG tablet Take 1 tablet (500 mg total) by mouth every 6 (six) hours as needed for muscle spasms. (Patient taking differently: Take 500 mg by mouth at bedtime. ) 60 tablet 0  . Misc Natural Products (JOINT HEALTH) CAPS Take 300 mg by mouth daily. Arthrocen 300 mg Supplement    . Oxymetazoline HCl (NASAL SPRAY NA) Place 1-2 sprays into both nostrils at bedtime as needed  (for allergies).    . pantoprazole (PROTONIX) 40 MG tablet Take 40 mg by mouth 2 (two) times daily.     . pioglitazone (ACTOS) 15 MG tablet Take 15 mg by mouth daily.    . quinapril (ACCUPRIL) 40 MG tablet Take 40 mg by mouth daily.    . rosuvastatin (CRESTOR) 20 MG tablet TAKE 1 TABLET BY MOUTH EVERY DAY (Patient taking differently: Take 20 mg by mouth daily. ) 90 tablet 1   No current facility-administered medications for  this encounter.      REVIEW OF SYSTEMS: On review of systems, the patient reports that she is doing well overall. She denies any chest pain, shortness of breath, cough, fevers, chills, night sweats, unintended weight changes. She denies any new bowel or bladder disturbances, and denies abdominal pain, nausea or vomiting. She has a history of colitis and reports that she does have episodes of urgent bowel movements regularly. She denies any new musculoskeletal or joint aches or pains. A complete review of systems is obtained and is otherwise negative.     PHYSICAL EXAM:  Wt Readings from Last 3 Encounters:  11/09/18 217 lb 6.4 oz (98.6 kg)  09/07/18 225 lb (102.1 kg)  08/18/18 225 lb (102.1 kg)   Temp Readings from Last 3 Encounters:  11/09/18 97.6 F (36.4 C) (Oral)  12/05/17 (!) 100.9 F (38.3 C) (Oral)  11/25/17 97.6 F (36.4 C) (Oral)   BP Readings from Last 3 Encounters:  11/09/18 (!) 151/74  09/19/18 132/80  08/18/18 126/82   Pulse Readings from Last 3 Encounters:  11/09/18 85  08/18/18 89  12/05/17 (!) 110     In general this is a well appearing Caucasian female in no acute distress. She is alert and oriented x4 and appropriate throughout the examination. HEENT reveals that the patient is normocephalic, atraumatic. EOMs are intact. Skin is intact without any evidence of gross lesions. Cardiopulmonary assessment is negative for acute distress and she exhibits normal effort. Breast exam is deferred until her next visit.   ECOG = 0  0 - Asymptomatic  (Fully active, able to carry on all predisease activities without restriction)  1 - Symptomatic but completely ambulatory (Restricted in physically strenuous activity but ambulatory and able to carry out work of a light or sedentary nature. For example, light housework, office work)  2 - Symptomatic, <50% in bed during the day (Ambulatory and capable of all self care but unable to carry out any work activities. Up and about more than 50% of waking hours)  3 - Symptomatic, >50% in bed, but not bedbound (Capable of only limited self-care, confined to bed or chair 50% or more of waking hours)  4 - Bedbound (Completely disabled. Cannot carry on any self-care. Totally confined to bed or chair)  5 - Death   Eustace Pen MM, Creech RH, Tormey DC, et al. [phone]). "Toxicity and response criteria of the [facility]". [place] Oncol. 5 (6): 649-55    LABORATORY DATA:  Lab Results  Component Value Date   WBC 15.6 (H) 12/03/2017   HGB 11.0 (L) 12/03/2017   HCT 32.9 (L) 12/03/2017   MCV 84.4 12/03/2017   PLT 258 12/03/2017   Lab Results  Component Value Date   NA 134 (L) 12/03/2017   K 4.4 12/03/2017   CL 102 12/03/2017   CO2 23 12/03/2017   Lab Results  Component Value Date   ALT 17 07/30/2015   AST 26 07/30/2015   ALKPHOS 68 07/30/2015   BILITOT 0.5 07/30/2015      RADIOGRAPHY: US Breast Ltd Uni Right Inc Axilla  Result Date: 10/12/2018 CLINICAL DATA:  Screening recall for a possible right breast mass. EXAM: DIGITAL DIAGNOSTIC RIGHT MAMMOGRAM WITH CAD AND TOMO ULTRASOUND RIGHT BREAST COMPARISON:  Previous exam(s). ACR Breast Density Category c: The breast tissue is heterogeneously dense, which may obscure small masses. FINDINGS: Spot compression tomosynthesis images through the upper outer quadrant of the right breast demonstrates a persistent irregular mass with indistinct  margins measuring approximately 1 cm. Mammographic images were processed with CAD. On physical  exam, no definite palpable mass is identified in the upper-outer right breast. Targeted ultrasound is performed, showing an irregular hypoechoic shadowing mass at 10:30, 6 cm from the nipple measuring 9 x 8 x 9 mm. Ultrasound of the right axilla demonstrates multiple normal-appearing lymph nodes. IMPRESSION: 1.  There is a suspicious mass in the right breast at 10:30. 2.  No evidence of right axillary lymphadenopathy. RECOMMENDATION: Ultrasound guided biopsy is recommended for the right breast mass. This has been scheduled for 10/18/2018 at 12:45 p.m. I have discussed the findings and recommendations with the patient. Results were also provided in writing at the conclusion of the visit. If applicable, a reminder letter will be sent to the patient regarding the next appointment. BI-RADS CATEGORY  4: Suspicious. Electronically Signed   By: Ammie Ferrier M.D.   On: 10/12/2018 13:13   Mm Diag Breast Tomo Uni Right  Result Date: 10/12/2018 CLINICAL DATA:  Screening recall for a possible right breast mass. EXAM: DIGITAL DIAGNOSTIC RIGHT MAMMOGRAM WITH CAD AND TOMO ULTRASOUND RIGHT BREAST COMPARISON:  Previous exam(s). ACR Breast Density Category c: The breast tissue is heterogeneously dense, which may obscure small masses. FINDINGS: Spot compression tomosynthesis images through the upper outer quadrant of the right breast demonstrates a persistent irregular mass with indistinct margins measuring approximately 1 cm. Mammographic images were processed with CAD. On physical exam, no definite palpable mass is identified in the upper-outer right breast. Targeted ultrasound is performed, showing an irregular hypoechoic shadowing mass at 10:30, 6 cm from the nipple measuring 9 x 8 x 9 mm. Ultrasound of the right axilla demonstrates multiple normal-appearing lymph nodes. IMPRESSION: 1.  There is a suspicious mass in the right breast at 10:30. 2.  No evidence of right axillary lymphadenopathy. RECOMMENDATION: Ultrasound  guided biopsy is recommended for the right breast mass. This has been scheduled for 10/18/2018 at 12:45 p.m. I have discussed the findings and recommendations with the patient. Results were also provided in writing at the conclusion of the visit. If applicable, a reminder letter will be sent to the patient regarding the next appointment. BI-RADS CATEGORY  4: Suspicious. Electronically Signed   By: Ammie Ferrier M.D.   On: 10/12/2018 13:13   Mm Clip Placement Right  Result Date: 10/18/2018 CLINICAL DATA:  Ultrasound-guided biopsy was performed of a mass in the 10:30 position of the right breast. EXAM: DIAGNOSTIC RIGHT MAMMOGRAM POST ULTRASOUND BIOPSY COMPARISON:  Previous exam(s). FINDINGS: Mammographic images were obtained following ultrasound guided biopsy of a right breast mass at 10 o'clock position. A ribbon shaped biopsy clip is satisfactorily positioned in the right breast mass. IMPRESSION: Satisfactory position of ribbon shaped biopsy clip. Final Assessment: Post Procedure Mammograms for Marker Placement Electronically Signed   By: Curlene Dolphin M.D.   On: 10/18/2018 13:58   [place] Rt Breast Bx W Loc Dev 1st Lesion Img Bx Spec US Guide  Addendum Date: 10/27/2018   ADDENDUM REPORT: 10/26/2018 07:55 ADDENDUM: Pathology revealed GRADE II INVASIVE DUCTAL CARCINOMA of the Right breast, 10 o'clock. This was found to be concordant by Dr. Curlene Dolphin. Pathology results were discussed with the patient by telephone. The patient reported doing well after the biopsy with tenderness at the site. Post biopsy instructions and care were reviewed and questions were answered. The patient was encouraged to call [facility] for any additional concerns. Surgical consultation has been arranged with Dr. Nedra Hai  at [facility] on November 07, 2018. Pathology results reported by Terie Purser, RN on 10/26/2018. Electronically Signed   By: Curlene Dolphin M.D.   On: 10/26/2018 07:55    Result Date: 10/27/2018 CLINICAL DATA:  Ultrasound-guided core needle biopsy is recommended of the right breast mass at 10:30 position from the nipple. EXAM: ULTRASOUND GUIDED RIGHT BREAST CORE NEEDLE BIOPSY COMPARISON:  Previous exam(s). FINDINGS: I met with the patient and we discussed the procedure of ultrasound-guided biopsy, including benefits and alternatives. We discussed the high likelihood of a successful procedure. We discussed the risks of the procedure, including infection, bleeding, tissue injury, clip migration, and inadequate sampling. Informed written consent was given. The usual time-out protocol was performed immediately prior to the procedure. Lesion quadrant: Upper outer quadrant Using sterile technique and 1% Lidocaine as local anesthetic, under direct ultrasound visualization, a 12 gauge spring-loaded device was used to perform biopsy of a mass in the 10:30 position of the right breast using a lateral approach. At the conclusion of the procedure a ribbon tissue marker clip was deployed into the biopsy cavity. Follow up 2 view mammogram was performed and dictated separately. IMPRESSION: Ultrasound guided biopsy of the right breast. No apparent complications. Electronically Signed: By: Curlene Dolphin M.D. On: 10/18/2018 14:00       IMPRESSION/PLAN: 1. Stage IA, [id number] ER/PR invasive ductal carcinoma of the right breast. Dr. Lisbeth Renshaw discusses the pathology findings and reviews the nature of invasive breast disease. The consensus from the breast conference includes breast conservation with lumpectomy with sentinel node biopsy. We discussed the options of adjuvant external radiotherapy to the breast followed by antiestrogen therapy. We discussed the risks, benefits, short, and long term effects of radiotherapy, and the patient is interested in proceeding. Dr. Lisbeth Renshaw discusses the delivery and logistics of radiotherapy and anticipates a course of 4 to 6 1/2 weeks of radiotherapy. We will see  her back about 2 weeks after surgery to discuss the simulation process and anticipate we starting radiotherapy about 4-6 weeks after surgery.    In a visit lasting 60 minutes, greater than 50% of the time was spent face to face discussing her case, and coordinating the patient's care.  The above documentation reflects my direct findings during this shared patient visit. Please see the separate note by Dr. Lisbeth Renshaw on this date for the remainder of the patient's plan of care.    Carola Rhine, PAC

## 2018-11-09 NOTE — Telephone Encounter (Signed)
Gave avs and calendar ° °

## 2018-11-09 NOTE — Progress Notes (Signed)
[name]  [phone] [phone]) [phone]     ID: Lauren [name] DOB: [birth year]  MR#: [id number]  XLK#:[id number]  Patient Care Team: Lauren Cruel, [name] as PCP - General (Family Medicine) Lauren Rossetti, [name] as Consulting Physician (Orthopedic Surgery) Lauren [name], Lauren Dad, [name] as Consulting Physician (Oncology) Lauren Rudd, [name] as Consulting Physician (Radiation Oncology) Lauren Cote, NP as Nurse Practitioner (Obstetrics and Gynecology) Lauren [name] as Consulting Physician (Gastroenterology) OTHER [name]:   CHIEF COMPLAINT: Estrogen receptor positive breast cancer   CURRENT TREATMENT: Awaiting definitive surgery   HISTORY OF CURRENT ILLNESS: Lauren [name]" had routine screening mammography on 10/04/2018 showing a possible abnormality in the right breast. She underwent unilateral right diagnostic mammography with tomography and right breast ultrasonography at [place] [facility] [place] on 10/12/2018 showing: Breast Density Category C. Spot compression tomosynthesis images through the upper outer quadrant of the right breast demonstrates a persistent irregular mass with indistinct margins measuring approximately 1 cm. On physical exam, no definite palpable mass is identified in the upper-outer right breast. Targeted ultrasound is performed, showing an irregular hypoechoic shadowing mass at 10:30 oclock, 6 cm from the nipple measuring 9 x 8 x 9 mm. Ultrasound of the right axilla demonstrates multiple normal-appearing lymph nodes.  Accordingly on 10/18/2018 she proceeded to biopsy of the right breast area in question. The pathology from this procedure showed ([id number]): invasive ductal carcinoma, grade II. Prognostic indicators significant for: estrogen receptor, 100% positive and progesterone receptor, 20% positive, both with strong staining intensity. Proliferation marker Ki67 at 5%. HER2 equivocal (2+) by immunohistochemisty but negative by fluorescent in situ  hybridization with a signals ratio 1.26 and number per cell 2.40.   The patient's subsequent history is as detailed below.   INTERVAL HISTORY: Lauren [name] was evaluated in the multidisciplinary breast cancer clinic on 11/09/2018 accompanied by her husband, Lauren [name], and her daughter, Lauren [name].   Her case was also presented at the breast cancer conference on 11/08/2018. At that time a preliminary plan was proposed: Breast conserving surgery with sentinel lymph node sampling, antiestrogens, possibly adjuvant radiation   REVIEW OF SYSTEMS: There were no specific symptoms leading to the original mammogram, which was routinely scheduled. Lauren [name] denies unusual headaches, visual changes, nausea, vomiting, stiff neck, dizziness, or gait imbalance. There has been no cough, phlegm production, or pleurisy, no chest pain or pressure, and no change in bowel or bladder habits. She denies fever, rash, bleeding, unexplained fatigue or unexplained weight loss. A detailed review of systems was otherwise entirely negative.   PAST MEDICAL HISTORY: Past Medical History:  Diagnosis Date  . Arthritis    Back, Hip- right  . Cataract   . Diabetes mellitus without complication ([place])    Type II  . Endometriosis   . Family history of adverse reaction to anesthesia    Father - N/V - blood pressure; daughter N/V  . GERD (gastroesophageal reflux disease)   . History of kidney stones   . History of ulcerative colitis   . Hypertension   . Insomnia   . 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 Neuropathy   . Pneumonia    1983, 2003  . Umbilical hernia   . Vitamin D deficiency      PAST SURGICAL HISTORY: Past Surgical History:  Procedure Laterality Date  . BREAST BIOPSY  08-13-2009  Lauren [name]   EXCISION LEFT NIPPLE DUCT  . BREAST EXCISIONAL BIOPSY Left    x2  . BREAST EXCISIONAL BIOPSY Right   .  CHOLECYSTECTOMY  1992  . COLON RESECTION  1986   ULCERATIVE COLITIS  . COLON SURGERY    . ESOPHAGEAL MANOMETRY N/A 06/11/2013    Procedure: ESOPHAGEAL MANOMETRY (EM);  Surgeon: Lauren [name];  Location: [facility] END[facility];  Service: Endoscopy;  Laterality: N/A;  . ESOPHAGOGASTRODUODENOSCOPY (EGD) WITH PROPOFOL N/A 05/31/2016   Procedure: ESOPHAGOGASTRODUODENOSCOPY (EGD) WITH PROPOFOL;  Surgeon: Lauren [name];  Location: [facility]COPY;  Service: Endoscopy;  Laterality: N/A;  . EXCISION RIGHT BREAST MASS   10-20-2005  Lauren [name]  . kidney stone removed  2/11  . KNEE ARTHROSCOPY Left    MCL  . LEFT THUMB CARPOMETACARPAL JOINT SUSPENSIONPLASTY  04-06-2006  Lauren Lauren [name]  . LEFT WRIST ARTHROSCOPY W/ DEBRIDEMENT AND REMOVAL CYST  03-26-2004  Lauren Lauren [name]  . MYOMECTOMY    . RIGHT COLECTOMY    . RIGHT URETEROSCOPIC STONE EXTRACTION  12-11-2009  Lauren Lauren [name]  . TENDON RELEASE    . TOTAL HIP ARTHROPLASTY Right 11/26/2016   Procedure: RIGHT TOTAL HIP ARTHROPLASTY ANTERIOR APPROACH;  Surgeon: Lauren Rossetti, [name];  Location: [facility] ORS;  Service: Orthopedics;  Laterality: Right;  . TOTAL KNEE ARTHROPLASTY Left 12/02/2017   Procedure: LEFT TOTAL KNEE ARTHROPLASTY;  Surgeon: Lauren Rossetti, [name];  Location: [facility] ORS;  Service: Orthopedics;  Laterality: Left;  . WRIST SURGERY     both     FAMILY HISTORY: Family History  Problem Relation Age of Onset  . Hypertension Father   . Heart disease Father   . Diabetes Father   . Cancer Father        colon, prostate, and kidney  . Hypertension Mother   . Parkinson's disease Mother   . Heart disease Maternal Grandmother   . Diabetes Maternal Grandmother   . Heart disease Maternal Grandfather   . Diabetes Maternal Grandfather   . Cancer Maternal Grandfather        pt unaware of what kind  . Stroke Paternal Grandmother   . Stroke Paternal Grandfather    Cindy's father died from colon cancer at age 1; he was diagnosed with prostate cancer at 110, and he also had kidney cancer. Patients' mother died from failure to thrive at age 34. The patient has no siblings. Lauren [name] had a  paternal aunt that was diagnosed with breast cancer in her mid 43's. Lauren [name] also has a paternal great grandfather that was diagnosed with prostate cancer. Patient denies anyone in her family having ovarian cancer.    GYNECOLOGIC HISTORY:  No LMP recorded. Patient is postmenopausal. 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 Her husband, Lauren [name], ran a Building surveyor. Together, they have two children, Richard and Dillard's. Their son, Delfino Lovett, lives in [place] and is disabled (MS). Their daughter, Lauren [name], is a Physiological scientist (and also has MS). Lauren [name] has 5 grandchildren, no great grandchildren. She attends a [employer].    ADVANCED DIRECTIVES:    HEALTH MAINTENANCE: Social History   Tobacco Use  . Smoking status: Former Smoker    Packs/day: 1.00    Years: 10.00    Pack years: 10.00    Types: Cigarettes    Last attempt to quit: 09/22/1982    Years since quitting: 36.1  . Smokeless tobacco: Never Used  . Tobacco comment:  quit 1983  Substance Use Topics  . Alcohol use: No  . Drug use: No    Colonoscopy: yes, 3 or 4 years ago  PAP: Stopped at 25; Elon Alas, [facility]  Bone density: yes, 2+ years ago with unknown result; Elon Alas, [facility]  Allergies  Allergen Reactions  . Ciprofloxacin Hcl Hives  . Percocet [Oxycodone-Acetaminophen] Other (See Comments)    RESPIRATORY DISTRESS  . Penicillins Hives and Other (See Comments)    Has patient had a PCN reaction causing immediate rash, facial/tongue/throat swelling, SOB or lightheadedness with hypotension: no Has patient had a PCN reaction causing severe rash involving mucus membranes or skin necrosis: no Has patient had a PCN reaction that  required hospitalization no Has patient had a PCN reaction occurring within the last 10 years: no If all of the above answers are "NO", then may proceed with Cephalosporin use.  . Sulfa Antibiotics Hives  . Tape Other (See Comments)    Adhesives - Causes Blisters. No band aids, and plastic tape   . Tetanus Toxoids Hives and Rash    Current Outpatient Medications  Medication Sig Dispense Refill  . amLODipine (NORVASC) 5 MG tablet TAKE 1 TABLET BY MOUTH ONCE DAILY *NEED  OFFICE  VISIT* (Patient taking differently: Take 5 mg by mouth daily. ) 90 tablet 3  . aspirin EC 81 MG tablet Take 81 mg by mouth daily.    Marland Kitchen b complex vitamins tablet Take 1 tablet by mouth daily.    . balsalazide (COLAZAL) 750 MG capsule Take 2,250 mg by mouth 3 (three) times daily.     . Cholecalciferol (HM VITAMIN D3) 2000 units CAPS Take 2,000 Units by mouth daily.     . cholestyramine (QUESTRAN) 4 GM/DOSE powder Take 4 g by mouth daily.     . furosemide (LASIX) 20 MG tablet Take 20 mg by mouth daily.     Marland Kitchen gabapentin (NEURONTIN) 300 MG capsule Take 600 mg by mouth at bedtime.   1  . HYDROcodone-acetaminophen (NORCO) 10-325 MG tablet Take 1 tablet by mouth 3 (three) times daily as needed for moderate pain.     Marland Kitchen lidocaine (ASPERCREME W/LIDOCAINE) 4 % cream Apply 1 application topically daily as needed (for joint pain).     . magnesium gluconate (MAGONATE) 500 MG tablet Take 500 mg by mouth at bedtime.    . metFORMIN (GLUCOPHAGE-XR) 500 MG 24 hr tablet Take 1,000 mg by mouth 2 (two) times daily.     . methocarbamol (ROBAXIN) 500 MG tablet Take 1 tablet (500 mg total) by mouth every 6 (six) hours as needed for muscle spasms. (Patient taking differently: Take 500 mg by mouth at bedtime. ) 60 tablet 0  . Misc Natural Products (JOINT HEALTH) CAPS Take 300 mg by mouth daily. Arthrocen 300 mg Supplement    . Oxymetazoline HCl (NASAL SPRAY NA) Place 1-2 sprays into both nostrils at bedtime as needed (for allergies).    .  pantoprazole (PROTONIX) 40 MG tablet Take 40 mg by mouth 2 (two) times daily.     . pioglitazone (ACTOS) 15 MG tablet Take 15 mg by mouth daily.    . quinapril (ACCUPRIL) 40 MG tablet Take 40 mg by mouth daily.    . rosuvastatin (CRESTOR) 20 MG tablet TAKE 1 TABLET BY MOUTH EVERY DAY (Patient taking differently: Take 20 mg by mouth daily. ) 90 tablet 1   No current facility-administered medications for this visit.  OBJECTIVE: Middle-aged white woman in no acute distress  Vitals:   11/09/18 1425  BP: 136/87  Pulse: 79  Resp: 18  Temp: 97.7 F (36.5 C)  SpO2: 98%     Body mass index is 37.2 kg/m.   Wt Readings from Last 3 Encounters:  11/09/18 216 lb 11.2 oz (98.3 kg)  11/09/18 217 lb 6.4 oz (98.6 kg)  09/07/18 225 lb (102.1 kg)      ECOG FS:1 - Symptomatic but completely ambulatory  Ocular: Sclerae unicteric, pupils round and equal Ear-nose-throat: Oropharynx clear and moist Lymphatic: No cervical or supraclavicular adenopathy Lungs no rales or rhonchi Heart regular rate and rhythm Abd soft, nontender, positive bowel sounds MSK no focal spinal tenderness, no joint edema Neuro: non-focal, well-oriented, appropriate affect Breasts: Both breasts are status post remote biopsies.  The right breast is status post recent biopsy.  There is no palpable mass.  There is no skin or nipple change of concern.  Both axillae are benign.   LAB RESULTS:  CMP     Component Value Date/Time   NA 140 11/09/2018 1405   K 4.7 11/09/2018 1405   CL 103 11/09/2018 1405   CO2 27 11/09/2018 1405   GLUCOSE 197 (H) 11/09/2018 1405   BUN 14 11/09/2018 1405   CREATININE 0.96 11/09/2018 1405   CALCIUM 9.9 11/09/2018 1405   PROT 7.4 11/09/2018 1405   ALBUMIN 4.1 11/09/2018 1405   AST 34 11/09/2018 1405   ALT 24 11/09/2018 1405   ALKPHOS 64 11/09/2018 1405   BILITOT 0.4 11/09/2018 1405   GFRNONAA 59 (L) 11/09/2018 1405   GFRAA >60 11/09/2018 1405    No results found for: TOTALPROTELP,  ALBUMINELP, A1GS, A2GS, BETS, BETA2SER, GAMS, MSPIKE, SPEI  No results found for: KPAFRELGTCHN, LAMBDASER, KAPLAMBRATIO  Lab Results  Component Value Date   WBC 11.2 (H) 11/09/2018   NEUTROABS 7.6 11/09/2018   HGB 13.0 11/09/2018   HCT 41.4 11/09/2018   MCV 87.7 11/09/2018   PLT 300 11/09/2018    @LASTCHEMISTRY @  No results found for: LABCA2  No components found for: [id number]  No results for input(s): INR in the last 168 hours.  No results found for: LABCA2  No results found for: MLJ449  No results found for: EEF007  No results found for: HQR975  No results found for: CA2729  No components found for: HGQUANT  No results found for: CEA1 / No results found for: CEA1   No results found for: AFPTUMOR  No results found for: CHROMOGRNA  No results found for: PSA1  Appointment on 11/09/2018  Component Date Value Ref Range Status  . Sodium 11/09/2018 140  135 - 145 mmol/L Final  . Potassium 11/09/2018 4.7  3.5 - 5.1 mmol/L Final  . Chloride 11/09/2018 103  98 - 111 mmol/L Final  . CO2 11/09/2018 27  22 - 32 mmol/L Final  . Glucose, Bld 11/09/2018 197* 70 - 99 mg/dL Final  . BUN 11/09/2018 14  8 - 23 mg/dL Final  . Creatinine 11/09/2018 0.96  0.44 - 1.00 mg/dL Final  . Calcium 11/09/2018 9.9  8.9 - 10.3 mg/dL Final  . Total Protein 11/09/2018 7.4  6.5 - 8.1 g/dL Final  . Albumin 11/09/2018 4.1  3.5 - 5.0 g/dL Final  . AST 11/09/2018 34  15 - 41 U/L Final  . ALT 11/09/2018 24  0 - 44 U/L Final  . Alkaline Phosphatase 11/09/2018 64  38 - 126 U/L Final  . Total Bilirubin 11/09/2018 0.4  0.3 - 1.2 mg/dL Final  . GFR, Est Non Af Am 11/09/2018 59* >60 mL/min Final  . GFR, Est AFR Am 11/09/2018 >60  >60 mL/min Final  . Anion gap 11/09/2018 10  5 - 15 Final   Performed at [facility], [place] [address], [place], [place] [address]  . WBC Count 11/09/2018 11.2* 4.0 - 10.5 K/uL Final  . RBC 11/09/2018 4.72  3.87 - 5.11 MIL/uL Final  . Hemoglobin  11/09/2018 13.0  12.0 - 15.0 g/dL Final  . HCT 11/09/2018 41.4  36.0 - 46.0 % Final  . MCV 11/09/2018 87.7  80.0 - 100.0 fL Final  . MCH 11/09/2018 27.5  26.0 - 34.0 pg Final  . MCHC 11/09/2018 31.4  30.0 - 36.0 g/dL Final  . RDW 11/09/2018 14.2  11.5 - 15.5 % Final  . Platelet Count 11/09/2018 300  150 - 400 K/uL Final  . nRBC 11/09/2018 0.0  0.0 - 0.2 % Final  . Neutrophils Relative % 11/09/2018 67  % Final  . Neutro Abs 11/09/2018 7.6  1.7 - 7.7 K/uL Final  . Lymphocytes Relative 11/09/2018 19  % Final  . Lymphs Abs 11/09/2018 2.2  0.7 - 4.0 K/uL Final  . Monocytes Relative 11/09/2018 10  % Final  . Monocytes Absolute 11/09/2018 1.1* 0.1 - 1.0 K/uL Final  . Eosinophils Relative 11/09/2018 3  % Final  . Eosinophils Absolute 11/09/2018 0.3  0.0 - 0.5 K/uL Final  . Basophils Relative 11/09/2018 0  % Final  . Basophils Absolute 11/09/2018 0.1  0.0 - 0.1 K/uL Final  . Immature Granulocytes 11/09/2018 1  % Final  . Abs Immature Granulocytes 11/09/2018 0.06  0.00 - 0.07 K/uL Final   Performed at [facility], [address] [place], [place] [address]    (this displays the last labs from the last 3 days)  No results found for: TOTALPROTELP, ALBUMINELP, A1GS, A2GS, BETS, BETA2SER, GAMS, MSPIKE, SPEI (this displays SPEP labs)  No results found for: KPAFRELGTCHN, LAMBDASER, KAPLAMBRATIO (kappa/lambda light chains)  No results found for: HGBA, HGBA2QUANT, HGBFQUANT, HGBSQUAN (Hemoglobinopathy evaluation)   No results found for: LDH  No results found for: IRON, TIBC, IRONPCTSAT (Iron and TIBC)  No results found for: FERRITIN  Urinalysis    Component Value Date/Time   COLORURINE YELLOW 09/19/2018 [place] 09/19/2018 1143   LABSPEC 1.010 09/19/2018 1143   PHURINE 6.0 09/19/2018 1143   GLUCOSEU 3+ (A) 09/19/2018 1143   HGBUR TRACE (A) 09/19/2018 1143   BILIRUBINUR NEGATIVE 11/05/2015 [place] 09/19/2018 [place] 09/19/2018 1143   UROBILINOGEN 0.2 11/28/2013 1456   NITRITE NEGATIVE 11/05/2015 1512   LEUKOCYTESUR 1+ (A) 11/05/2015 1512     STUDIES:  US Breast Ltd Uni Right Inc Axilla  Result Date: 10/12/2018 CLINICAL DATA:  Screening recall for a possible right breast mass. EXAM: DIGITAL DIAGNOSTIC RIGHT MAMMOGRAM WITH CAD AND TOMO ULTRASOUND RIGHT BREAST COMPARISON:  Previous exam(s). ACR Breast Density Category c: The breast tissue is heterogeneously dense, which may obscure small masses. FINDINGS: Spot compression tomosynthesis images through the upper outer quadrant of the right breast demonstrates a persistent irregular mass with indistinct margins measuring approximately 1 cm. Mammographic images were processed with CAD. On physical exam, no definite palpable mass is identified in the upper-outer right breast. Targeted ultrasound is performed, showing an irregular hypoechoic shadowing mass at 10:30, 6 cm from the nipple measuring 9 x 8 x  9 mm. Ultrasound of the right axilla demonstrates multiple normal-appearing lymph nodes. IMPRESSION: 1.  There is a suspicious mass in the right breast at 10:30. 2.  No evidence of right axillary lymphadenopathy. RECOMMENDATION: Ultrasound guided biopsy is recommended for the right breast mass. This has been scheduled for 10/18/2018 at 12:45 p.m. I have discussed the findings and recommendations with the patient. Results were also provided in writing at the conclusion of the visit. If applicable, a reminder letter will be sent to the patient regarding the next appointment. BI-RADS CATEGORY  4: Suspicious. Electronically Signed   By: Ammie Ferrier M.D.   On: 10/12/2018 13:13   Mm Diag Breast Tomo Uni Right  Result Date: 10/12/2018 CLINICAL DATA:  Screening recall for a possible right breast mass. EXAM: DIGITAL DIAGNOSTIC RIGHT MAMMOGRAM WITH CAD AND TOMO ULTRASOUND RIGHT BREAST COMPARISON:  Previous exam(s). ACR Breast Density Category c: The breast tissue is  heterogeneously dense, which may obscure small masses. FINDINGS: Spot compression tomosynthesis images through the upper outer quadrant of the right breast demonstrates a persistent irregular mass with indistinct margins measuring approximately 1 cm. Mammographic images were processed with CAD. On physical exam, no definite palpable mass is identified in the upper-outer right breast. Targeted ultrasound is performed, showing an irregular hypoechoic shadowing mass at 10:30, 6 cm from the nipple measuring 9 x 8 x 9 mm. Ultrasound of the right axilla demonstrates multiple normal-appearing lymph nodes. IMPRESSION: 1.  There is a suspicious mass in the right breast at 10:30. 2.  No evidence of right axillary lymphadenopathy. RECOMMENDATION: Ultrasound guided biopsy is recommended for the right breast mass. This has been scheduled for 10/18/2018 at 12:45 p.m. I have discussed the findings and recommendations with the patient. Results were also provided in writing at the conclusion of the visit. If applicable, a reminder letter will be sent to the patient regarding the next appointment. BI-RADS CATEGORY  4: Suspicious. Electronically Signed   By: Ammie Ferrier M.D.   On: 10/12/2018 13:13   Mm Clip Placement Right  Result Date: 10/18/2018 CLINICAL DATA:  Ultrasound-guided biopsy was performed of a mass in the 10:30 position of the right breast. EXAM: DIAGNOSTIC RIGHT MAMMOGRAM POST ULTRASOUND BIOPSY COMPARISON:  Previous exam(s). FINDINGS: Mammographic images were obtained following ultrasound guided biopsy of a right breast mass at 10 o'clock position. A ribbon shaped biopsy clip is satisfactorily positioned in the right breast mass. IMPRESSION: Satisfactory position of ribbon shaped biopsy clip. Final Assessment: Post Procedure Mammograms for Marker Placement Electronically Signed   By: Curlene Dolphin M.D.   On: 10/18/2018 13:58   [place] Rt Breast Bx W Loc Dev 1st Lesion Img Bx Spec US Guide  Addendum Date:  10/27/2018   ADDENDUM REPORT: 10/26/2018 07:55 ADDENDUM: Pathology revealed GRADE II INVASIVE DUCTAL CARCINOMA of the Right breast, 10 o'clock. This was found to be concordant by Lauren. Curlene Dolphin. Pathology results were discussed with the patient by telephone. The patient reported doing well after the biopsy with tenderness at the site. Post biopsy instructions and care were reviewed and questions were answered. The patient was encouraged to call [facility] for any additional concerns. Surgical consultation has been arranged with Lauren. Nedra Hai at [facility] on November 07, 2018. Pathology results reported by Terie Purser, RN on 10/26/2018. Electronically Signed   By: Curlene Dolphin M.D.   On: 10/26/2018 07:55   Result Date: 10/27/2018 CLINICAL DATA:  Ultrasound-guided core needle biopsy is recommended of the  right breast mass at 10:30 position from the nipple. EXAM: ULTRASOUND GUIDED RIGHT BREAST CORE NEEDLE BIOPSY COMPARISON:  Previous exam(s). FINDINGS: I met with the patient and we discussed the procedure of ultrasound-guided biopsy, including benefits and alternatives. We discussed the high likelihood of a successful procedure. We discussed the risks of the procedure, including infection, bleeding, tissue injury, clip migration, and inadequate sampling. Informed written consent was given. The usual time-out protocol was performed immediately prior to the procedure. Lesion quadrant: Upper outer quadrant Using sterile technique and 1% Lidocaine as local anesthetic, under direct ultrasound visualization, a 12 gauge spring-loaded device was used to perform biopsy of a mass in the 10:30 position of the right breast using a lateral approach. At the conclusion of the procedure a ribbon tissue marker clip was deployed into the biopsy cavity. Follow up 2 view mammogram was performed and dictated separately. IMPRESSION: Ultrasound guided biopsy of the right breast. No  apparent complications. Electronically Signed: By: Curlene Dolphin M.D. On: 10/18/2018 14:00     ELIGIBLE FOR AVAILABLE RESEARCH PROTOCOL: no   ASSESSMENT: 73 y.o. [place], [place] woman status post right breast upper outer quadrant biopsy 10/18/2018 for a clinical T1b N0, stage IA invasive ductal carcinoma, grade 2, estrogen and progesterone receptor positive, MIB-1 5%, with no HER-2 amplification by FISH  (1) definitive surgery 11/16/2018  (a) repeat HER-2 testing on definitive surgical sample  (2) Oncotype to be obtained from the definitive surgical sample  (3) adjuvant radiation follow-up  (4) antiestrogens to start at the completion of local treatment  (5) genetics testing pending  PLAN: I spent approximately 60 minutes face to face with Lauren [name] with more than 50% of that time spent in counseling and coordination of care. Specifically we reviewed the biology of the patient's diagnosis and the specifics of her situation.  We first reviewed the fact that cancer is not one disease but more than 100 different diseases and that it is important to keep them separate-- otherwise when friends and relatives discuss their own cancer experiences with Lauren [name] confusion can result. Similarly we explained that if breast cancer spreads to the bone or liver, the patient would not have bone cancer or liver cancer, but breast cancer in the bone and breast cancer in the liver: one cancer in three places-- not 3 different cancers which otherwise would have to be treated in 3 different ways.  We discussed the difference between local and systemic therapy. In terms of loco-regional treatment, lumpectomy plus radiation is equivalent to mastectomy as far as survival is concerned. For this reason, and because the cosmetic results are generally superior, we recommend breast conserving surgery. .  We then discussed the rationale for systemic therapy. There is some risk that this cancer may have already spread to other  parts of her body. Patients frequently ask at this point about bone scans, CAT scans and PET scans to find out if they have occult breast cancer somewhere else. The problem is that in early stage disease we are much more likely to find false positives then true cancers and this would expose the patient to unnecessary procedures as well as unnecessary radiation. Scans cannot answer the question the patient really would like to know, which is whether she has microscopic disease elsewhere in her body. For those reasons we do not recommend them.  Of course we would proceed to aggressive evaluation of any symptoms that might suggest metastatic disease, but that is not the case here.  Next we went over  the options for systemic therapy which are anti-estrogens, anti-HER-2 immunotherapy, and chemotherapy.  The patient does not meet criteria for anti-HER-2 immunotherapy. She is a good candidate for anti-estrogens.  The question of chemotherapy is more complicated. Chemotherapy is most effective in rapidly growing, aggressive tumors. It is much less effective in low-grade, slow growing cancers, like Juliona 's. For that reason we are going to request an Oncotype from the definitive surgical sample, as suggested by NCCN guidelines. That will help [place] make a definitive decision regarding chemotherapy in this case.  However she understands I do not expect her to need radiation.  Lauren [name] does qualify for genetics testing..In patients who carry a deleterious mutation [for example in a  BRCA gene], the risk of a new breast cancer developing in the future may be sufficiently great that the patient may choose bilateral mastectomies. However if she wishes to keep her breasts in that situation it is safe to do so. That would require intensified screening, which generally means not only yearly mammography but a yearly breast MRI as well.  At this point it seems clear to her that if she did carry a deleterious mutation she would  choose intensified screening.  Accordingly there is no need to postpone her surgery until we have the genetics results.  She wondered if radiation was necessary.  We reviewed the fact that radiation does an excellent job of reducing the risk of local recurrence.  In her case it would not be expected to have a survival effect.  In other words if she does not have adjuvant radiation she is not compromising her survival, but she is exposing herself to a higher risk of local recurrence which will be somewhere between 5 and 10% above what it would be if she did receive radiation.  Given that she is clear in her mind that she would like to receive radiation and that is the plan at present  Tentatively I scheduling her to return to see me the first Friday in February.  We will have her pathology and her Oncotype at that time although not yet her genetics testing.  I am also going to request a repeat HER-2 on her final surgical sample.  Lauren [name] has a good understanding of the overall plan. She agrees with it. She knows the goal of treatment in her case is cure. She will call with any problems that may develop before her next visit here.   Lauren [name], Lauren Dad, [name]  11/09/18 4:26 PM Medical Oncology and Hematology [facility] [address] [place], [place] [address] Tel. [phone]    Fax. [phone]    I, Jacqualyn Posey am acting as a Education administrator for Chauncey Cruel, [name].   I, Lurline Del [name], have reviewed the above documentation for accuracy and completeness, and I agree with the above.

## 2018-11-10 ENCOUNTER — Encounter: Payer: Self-pay | Admitting: *Deleted

## 2018-11-10 ENCOUNTER — Encounter: Payer: Self-pay | Admitting: General Practice

## 2018-11-10 NOTE — Progress Notes (Signed)
[facility] Psychosocial Distress Screening Clinical Social Work  Clinical Social Work was referred by distress screening protocol.  The patient scored a 5 on the Psychosocial Distress Thermometer which indicates moderate distress. Clinical Social Worker contacted patient by phone to assess for distress and other psychosocial needs.  Aware of upcoming surgery followed by radiation.  Feels somewhat overwhelmed by volume of information presented, is "taking time to process this information." CSW and patient discussed common feeling and emotions when being diagnosed with cancer, and the importance of support during treatment. CSW informed patient of the support team and support services at [facility]. CSW provided contact information and encouraged patient to call with any questions or concerns.  Has a lot of support, husband of 86 years lives at home.  Husband has heart condition, recently had defibrillator implanted.  Will mail information packet on Support Center resources.    ONCBCN DISTRESS SCREENING 11/09/2018  Screening Type Initial Screening  Distress experienced in past week (1-10) 5  Emotional problem type Nervousness/Anxiety;Adjusting to illness  Physical Problem type Sleep/insomnia;Constipation/diarrhea  Other Reached by phone: [phone]    Clinical Social Worker follow up needed: No.  If yes, follow up plan:  Beverely Pace, [place], LCSW Clinical Social Worker Phone:  [phone]

## 2018-11-13 ENCOUNTER — Encounter (HOSPITAL_COMMUNITY)
Admission: RE | Admit: 2018-11-13 | Discharge: 2018-11-13 | Disposition: A | Discharge status: Home / Self Care | Payer: PPO | Source: Ambulatory Visit | Attending: Surgery | Admitting: Surgery

## 2018-11-13 ENCOUNTER — Other Ambulatory Visit: Payer: Self-pay

## 2018-11-13 ENCOUNTER — Encounter (HOSPITAL_COMMUNITY): Payer: Self-pay

## 2018-11-13 DIAGNOSIS — M1990 Unspecified osteoarthritis, unspecified site: Secondary | ICD-10-CM | POA: Diagnosis not present

## 2018-11-13 DIAGNOSIS — Z800 Family history of malignant neoplasm of digestive organs: Secondary | ICD-10-CM | POA: Diagnosis not present

## 2018-11-13 DIAGNOSIS — Z823 Family history of stroke: Secondary | ICD-10-CM | POA: Diagnosis not present

## 2018-11-13 DIAGNOSIS — Z881 Allergy status to other antibiotic agents status: Secondary | ICD-10-CM | POA: Diagnosis not present

## 2018-11-13 DIAGNOSIS — Z8489 Family history of other specified conditions: Secondary | ICD-10-CM | POA: Diagnosis not present

## 2018-11-13 DIAGNOSIS — Z01812 Encounter for preprocedural laboratory examination: Secondary | ICD-10-CM

## 2018-11-13 DIAGNOSIS — E119 Type 2 diabetes mellitus without complications: Secondary | ICD-10-CM | POA: Diagnosis not present

## 2018-11-13 DIAGNOSIS — I10 Essential (primary) hypertension: Secondary | ICD-10-CM | POA: Diagnosis not present

## 2018-11-13 DIAGNOSIS — Z8349 Family history of other endocrine, nutritional and metabolic diseases: Secondary | ICD-10-CM | POA: Diagnosis not present

## 2018-11-13 DIAGNOSIS — Z841 Family history of disorders of kidney and ureter: Secondary | ICD-10-CM | POA: Diagnosis not present

## 2018-11-13 DIAGNOSIS — Z833 Family history of diabetes mellitus: Secondary | ICD-10-CM | POA: Diagnosis not present

## 2018-11-13 DIAGNOSIS — E7800 Pure hypercholesterolemia, unspecified: Secondary | ICD-10-CM | POA: Diagnosis not present

## 2018-11-13 DIAGNOSIS — Z8042 Family history of malignant neoplasm of prostate: Secondary | ICD-10-CM | POA: Diagnosis not present

## 2018-11-13 DIAGNOSIS — Z8261 Family history of arthritis: Secondary | ICD-10-CM | POA: Diagnosis not present

## 2018-11-13 DIAGNOSIS — Z818 Family history of other mental and behavioral disorders: Secondary | ICD-10-CM | POA: Diagnosis not present

## 2018-11-13 DIAGNOSIS — Z87442 Personal history of urinary calculi: Secondary | ICD-10-CM | POA: Diagnosis not present

## 2018-11-13 DIAGNOSIS — Z880 Allergy status to penicillin: Secondary | ICD-10-CM | POA: Diagnosis not present

## 2018-11-13 DIAGNOSIS — Z170 Estrogen receptor positive status [ER+]: Secondary | ICD-10-CM | POA: Diagnosis not present

## 2018-11-13 DIAGNOSIS — Z887 Allergy status to serum and vaccine status: Secondary | ICD-10-CM | POA: Diagnosis not present

## 2018-11-13 DIAGNOSIS — Z79899 Other long term (current) drug therapy: Secondary | ICD-10-CM | POA: Diagnosis not present

## 2018-11-13 DIAGNOSIS — M549 Dorsalgia, unspecified: Secondary | ICD-10-CM | POA: Diagnosis not present

## 2018-11-13 DIAGNOSIS — C50411 Malignant neoplasm of upper-outer quadrant of right female breast: Secondary | ICD-10-CM | POA: Diagnosis not present

## 2018-11-13 DIAGNOSIS — Z91048 Other nonmedicinal substance allergy status: Secondary | ICD-10-CM | POA: Diagnosis not present

## 2018-11-13 DIAGNOSIS — K219 Gastro-esophageal reflux disease without esophagitis: Secondary | ICD-10-CM | POA: Diagnosis not present

## 2018-11-13 DIAGNOSIS — Z9049 Acquired absence of other specified parts of digestive tract: Secondary | ICD-10-CM | POA: Diagnosis not present

## 2018-11-13 HISTORY — DX: Presence of spectacles and contact lenses: Z97.3

## 2018-11-13 HISTORY — DX: Malignant (primary) neoplasm, unspecified: C80.1

## 2018-11-13 HISTORY — DX: Ventricular premature depolarization: I49.3

## 2018-11-13 LAB — HEMOGLOBIN A1C
Hgb A1c MFr Bld: 8.9 % — ABNORMAL HIGH (ref 4.8–5.6)
Mean Plasma Glucose: 208.73 mg/dL

## 2018-11-13 LAB — GLUCOSE, CAPILLARY: Glucose-Capillary: 257 mg/dL — ABNORMAL HIGH (ref 70–99)

## 2018-11-13 NOTE — Progress Notes (Signed)
Pt denies SOB and chest pain. Pt under the care of Dr. Oval Linsey, Cardiology. Pt denies having an echo and cardiac cath. Pt denies having a chest x ray within the last year. Pt denies having an A1c within the last two months. Pt stated that recent labs were performed at intake with oncologist (11/09/2018 Epic).  Pt stated that fasting blood glucose ranges between 150-180. Pt stated that a diabetes medication adjustment was recently made by PCP, Dr. Harrington Challenger at [place] three weeks ago. Pt verbalized understanding of all pre-op instructions. Pt chart forwarded to PA,  Anesthesiology, to review EKG and A1c.

## 2018-11-14 ENCOUNTER — Ambulatory Visit
Admission: RE | Admit: 2018-11-14 | Discharge: 2018-11-14 | Disposition: A | Discharge status: Home / Self Care | Payer: PPO | Source: Ambulatory Visit | Attending: Surgery | Admitting: Surgery

## 2018-11-14 DIAGNOSIS — D0511 Intraductal carcinoma in situ of right breast: Secondary | ICD-10-CM | POA: Diagnosis not present

## 2018-11-14 DIAGNOSIS — Z853 Personal history of malignant neoplasm of breast: Secondary | ICD-10-CM

## 2018-11-14 NOTE — Anesthesia Preprocedure Evaluation (Addendum)
Anesthesia Evaluation  Patient identified by MRN, date of birth, ID band Patient awake    Reviewed: Allergy & Precautions, NPO status , Patient's Chart, lab work & pertinent test results  Airway Mallampati: II  TM Distance: >3 FB     Dental   Pulmonary pneumonia, former smoker,    breath sounds clear to auscultation       Cardiovascular hypertension,  Rhythm:Regular Rate:Normal     Neuro/Psych    GI/Hepatic hiatal hernia, GERD  ,  Endo/Other  diabetes  Renal/GU      Musculoskeletal   Abdominal   Peds  Hematology   Anesthesia Other Findings   Reproductive/Obstetrics                           Anesthesia Physical Anesthesia Plan  ASA: III  Anesthesia Plan: General   Post-op Pain Management:  Regional for Post-op pain   Induction: Intravenous  PONV Risk Score and Plan: Treatment may vary due to age or medical condition  Airway Management Planned: Oral ETT  Additional Equipment:   Intra-op Plan:   Post-operative Plan: Extubation in OR  Informed Consent: I have reviewed the patients History and Physical, chart, labs and discussed the procedure including the risks, benefits and alternatives for the proposed anesthesia with the patient or authorized representative who has indicated his/her understanding and acceptance.     Dental advisory given  Plan Discussed with: Anesthesiologist  Anesthesia Plan Comments: (Follows with cardiology for hx of asymptomatic coronary calcification, diabetes, hypertension, hyperlipidemia, and family history of CAD. Nuclear stress 11/19 showed Normal pharmacologic nuclear stress test with no evidence for prior infarct or ischemia. EF 77%.)      Anesthesia Quick Evaluation

## 2018-11-15 NOTE — H&P (Signed)
[name] Documented: 11/07/2018 10:06 AM Location: [facility] Patient #: [phone] DOB: [birth year] Married / Language: Cleophus Molt / Race: White Female   History of Present Illness (Ashantee Deupree A. [name] MD; 11/07/2018 10:29 AM) The patient is a 73 year old female who presents with breast cancer. This patient is referred by Dr. Curlene Dolphin after the recent diagnosis of a right breast cancer. She had a small mass at the 10:30 position of the right breast seen on screening mammography. She has been asymptomatic. She had no nipple discharge. 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 The mass measured 9 mm. Ultrasound of the axilla was negative. It is 100% ER positive, 20% PR positive, and Ki-67 is 20%. It is HER-2 negative. She has had previous benign biopsies on both breasts in the remote past. She has no history of cancer. There is a remote family history of breast cancer in a paternal aunt. She is otherwise without complaints.   Past Surgical History Malachi Bonds, CMA; 11/07/2018 10:13 AM) Appendectomy  Breast Biopsy  Right. Colon Removal - Partial  Gallbladder Surgery - Laparoscopic  Hip Surgery  Right. No pertinent past surgical history  Oral Surgery  Resection of Small Bowel  Tonsillectomy   Diagnostic Studies History Malachi Bonds, CMA; 11/07/2018 10:13 AM) Colonoscopy  1-5 years ago Mammogram  within last year Pap Smear  >5 years ago  Allergies Malachi Bonds, CMA; 11/07/2018 10:07 AM) Adhesive Tape  Tetanus Toxoid Adsorbed *TOXOIDS*  Cipro *FLUOROQUINOLONES*  Sulfa Antibiotics  Penicillins   Medication History (Chemira Jones, CMA; 11/07/2018 10:13 AM) Pioglitazone HCl (15MG Tablet, Oral) Active. Balsalazide Disodium (750MG Capsule, Oral) Active. Rosuvastatin Calcium (20MG Tablet, Oral) Active. amLODIPine Besylate (5MG Tablet, Oral) Active. metFORMIN HCl ER (500MG Tablet ER 24HR, Oral)  Active. Gabapentin (300MG Capsule, Oral) Active. Pantoprazole Sodium (40MG Tablet DR, Oral) Active. Furosemide (20MG Tablet, Oral) Active. Quinapril HCl (40MG Tablet, Oral) Active. Methocarbamol (500MG Tablet, Oral) Active. HYDROcodone-Acetaminophen (10-325MG Tablet, Oral) Active. Cholestyramine Light (4GM Powder, Oral) Active. Aspercreme (10% Cream, External) Active. Magnesium (500MG Tablet, Oral) Active. Super B Complex (Oral) Active. Vitamin D3 (1000UNIT Tablet, Oral) Active. Medications Reconciled  Social History Malachi Bonds, CMA; 11/07/2018 10:13 AM) Alcohol use  Remotely quit alcohol use. Caffeine use  Tea. No alcohol use  No drug use  Tobacco use  Never smoker.  Family History Malachi Bonds, CMA; 11/07/2018 10:13 AM) Anesthetic complications  Father. Arthritis  Father, Mother. Cerebrovascular Accident  Father. Colon Cancer  Father. Depression  Mother. Diabetes Mellitus  Father, Mother. Heart Disease  Father. Hypertension  Father, Mother. Kidney Disease  Father, Mother. Migraine Headache  Daughter. Prostate Cancer  Father. Thyroid problems  Mother.  Pregnancy / Birth History Malachi Bonds, CMA; 11/07/2018 10:13 AM) Age at menarche  21 years. Age of menopause  72-50 Gravida  2 Maternal age  60-25 Para  2 Regular periods   Other Problems Malachi Bonds, CMA; 11/07/2018 10:13 AM) Arthritis  Back Pain  Breast Cancer  Cholelithiasis  Diabetes Mellitus  Gastroesophageal Reflux Disease  Hemorrhoids  High blood pressure  Hypercholesterolemia  Kidney Stone  Lump In Breast  Migraine Headache  Ulcerative Colitis  Umbilical Hernia Repair   Vitals (Chemira Jones CMA; 11/07/2018 10:06 AM) 11/07/2018 10:06 AM Weight: 220 lb Height: 65in Body Surface Area: 2.06 m Body Mass Index: 36.61 kg/m  Temp.: 98.42F(Oral)  Pulse: 89 (Regular)  BP: 140/76 (Sitting, Left Arm, Standard)       Physical Exam  (Imri Lor A. [name] MD; 11/07/2018 10:30  AM) General Mental Status-Alert. General Appearance-Consistent with stated age. Hydration-Well hydrated. Voice-Normal.  Head and Neck Head-normocephalic, atraumatic with no lesions or palpable masses. Trachea-midline. Thyroid Gland Characteristics - normal size and consistency.  Eye Eyeball - Bilateral-Extraocular movements intact. Sclera/Conjunctiva - Bilateral-No scleral icterus.  Chest and Lung Exam Chest and lung exam reveals -quiet, even and easy respiratory effort with no use of accessory muscles and on auscultation, normal breath sounds, no adventitious sounds and normal vocal resonance. Inspection Chest Wall - Normal. Back - normal.  Breast Breast - Left-Symmetric, Non Tender, No Biopsy scars, no Dimpling, No Inflammation, No Lumpectomy scars, No Mastectomy scars, No Peau d' Orange. Breast - Right-Symmetric, Non Tender, No Biopsy scars, no Dimpling, No Inflammation, No Lumpectomy scars, No Mastectomy scars, No Peau d' Orange. Breast Lump-No Palpable Breast Mass. Note: There are no palpable masses in either breast and no axillary adenopathy on either side   Cardiovascular Cardiovascular examination reveals -normal heart sounds, regular rate and rhythm with no murmurs and normal pedal pulses bilaterally.  Abdomen Inspection Inspection of the abdomen reveals - No Hernias. Skin - Scar - no surgical scars. Palpation/Percussion Palpation and Percussion of the abdomen reveal - Soft, Non Tender, No Rebound tenderness, No Rigidity (guarding) and No hepatosplenomegaly. Auscultation Auscultation of the abdomen reveals - Bowel sounds normal. Note: There is a small, easily reducible hernia at the umbilicus   Neurologic - Did not examine.  Musculoskeletal Normal Exam - Left-Upper Extremity Strength Normal and Lower Extremity Strength Normal. Normal Exam - Right-Upper Extremity Strength Normal and Lower  Extremity Strength Normal.  Lymphatic Head & Neck  General Head & Neck Lymphatics: Bilateral - Description - Normal. Axillary  General Axillary Region: Bilateral - Description - Normal. Tenderness - Non Tender. Femoral & Inguinal - Did not examine.    Assessment & Plan (Gwendolyn Nishi A. [name] MD; 11/07/2018 10:32 AM) BREAST CANCER, RIGHT (C50.911) Impression: This is a patient with a newly diagnosed invasive right breast cancer. I discussed the diagnosis with the patient and her family. I gave them a copy of the pathology results. We discussed mastectomy versus breast conservation. She is a candidate for breast conservation and is very interested in saving her breast. I discussed performing a radioactive seed guided right breast partial mastectomy with sentinel lymph node biopsy. I discussed the reasons for this with the family in detail. We discussed the surgical procedure in detail. I discussed the risks which includes but is not limited to bleeding, infection, injury to surrounding structures, cardiopulmonary issues, seroma formation, DVT, the need for further surgery if margins or lymph nodes are positive, postoperative recovery, etc. She will also be referred to both medical and radiation oncology. Surgery will be scheduled as soon as possible.

## 2018-11-16 ENCOUNTER — Other Ambulatory Visit: Payer: Self-pay

## 2018-11-16 ENCOUNTER — Encounter (HOSPITAL_COMMUNITY)
Admission: RE | Disposition: A | Discharge status: Home / Self Care | Payer: Self-pay | Source: Home / Self Care | Attending: Surgery

## 2018-11-16 ENCOUNTER — Ambulatory Visit (HOSPITAL_COMMUNITY)
Admission: RE | Admit: 2018-11-16 | Discharge: 2018-11-16 | Disposition: A | Discharge status: Home / Self Care | Payer: PPO | Source: Ambulatory Visit | Attending: Surgery | Admitting: Surgery

## 2018-11-16 ENCOUNTER — Ambulatory Visit (HOSPITAL_COMMUNITY)
Admission: RE | Admit: 2018-11-16 | Discharge: 2018-11-16 | Disposition: A | Discharge status: Home / Self Care | Payer: PPO | Attending: Surgery | Admitting: Surgery

## 2018-11-16 ENCOUNTER — Ambulatory Visit
Admission: RE | Admit: 2018-11-16 | Discharge: 2018-11-16 | Disposition: A | Discharge status: Home / Self Care | Payer: PPO | Source: Ambulatory Visit | Attending: Surgery | Admitting: Surgery

## 2018-11-16 ENCOUNTER — Ambulatory Visit (HOSPITAL_COMMUNITY): Payer: PPO | Admitting: Anesthesiology

## 2018-11-16 ENCOUNTER — Encounter (HOSPITAL_COMMUNITY): Payer: Self-pay | Admitting: Certified Registered Nurse Anesthetist

## 2018-11-16 ENCOUNTER — Ambulatory Visit (HOSPITAL_COMMUNITY): Payer: PPO | Admitting: Physician Assistant

## 2018-11-16 DIAGNOSIS — Z91048 Other nonmedicinal substance allergy status: Secondary | ICD-10-CM | POA: Insufficient documentation

## 2018-11-16 DIAGNOSIS — C50411 Malignant neoplasm of upper-outer quadrant of right female breast: Secondary | ICD-10-CM | POA: Diagnosis not present

## 2018-11-16 DIAGNOSIS — Z880 Allergy status to penicillin: Secondary | ICD-10-CM | POA: Diagnosis not present

## 2018-11-16 DIAGNOSIS — Z79899 Other long term (current) drug therapy: Secondary | ICD-10-CM | POA: Insufficient documentation

## 2018-11-16 DIAGNOSIS — Z170 Estrogen receptor positive status [ER+]: Secondary | ICD-10-CM | POA: Insufficient documentation

## 2018-11-16 DIAGNOSIS — Z87891 Personal history of nicotine dependence: Secondary | ICD-10-CM | POA: Insufficient documentation

## 2018-11-16 DIAGNOSIS — Z8349 Family history of other endocrine, nutritional and metabolic diseases: Secondary | ICD-10-CM | POA: Insufficient documentation

## 2018-11-16 DIAGNOSIS — E119 Type 2 diabetes mellitus without complications: Secondary | ICD-10-CM | POA: Insufficient documentation

## 2018-11-16 DIAGNOSIS — Z9049 Acquired absence of other specified parts of digestive tract: Secondary | ICD-10-CM | POA: Insufficient documentation

## 2018-11-16 DIAGNOSIS — Z823 Family history of stroke: Secondary | ICD-10-CM | POA: Insufficient documentation

## 2018-11-16 DIAGNOSIS — Z8042 Family history of malignant neoplasm of prostate: Secondary | ICD-10-CM | POA: Insufficient documentation

## 2018-11-16 DIAGNOSIS — C50911 Malignant neoplasm of unspecified site of right female breast: Secondary | ICD-10-CM | POA: Diagnosis not present

## 2018-11-16 DIAGNOSIS — Z841 Family history of disorders of kidney and ureter: Secondary | ICD-10-CM | POA: Insufficient documentation

## 2018-11-16 DIAGNOSIS — Z8261 Family history of arthritis: Secondary | ICD-10-CM | POA: Diagnosis not present

## 2018-11-16 DIAGNOSIS — K449 Diaphragmatic hernia without obstruction or gangrene: Secondary | ICD-10-CM | POA: Insufficient documentation

## 2018-11-16 DIAGNOSIS — I10 Essential (primary) hypertension: Secondary | ICD-10-CM | POA: Insufficient documentation

## 2018-11-16 DIAGNOSIS — M1990 Unspecified osteoarthritis, unspecified site: Secondary | ICD-10-CM | POA: Insufficient documentation

## 2018-11-16 DIAGNOSIS — Z8489 Family history of other specified conditions: Secondary | ICD-10-CM | POA: Insufficient documentation

## 2018-11-16 DIAGNOSIS — M549 Dorsalgia, unspecified: Secondary | ICD-10-CM | POA: Insufficient documentation

## 2018-11-16 DIAGNOSIS — Z800 Family history of malignant neoplasm of digestive organs: Secondary | ICD-10-CM | POA: Insufficient documentation

## 2018-11-16 DIAGNOSIS — K219 Gastro-esophageal reflux disease without esophagitis: Secondary | ICD-10-CM | POA: Diagnosis not present

## 2018-11-16 DIAGNOSIS — Z887 Allergy status to serum and vaccine status: Secondary | ICD-10-CM | POA: Insufficient documentation

## 2018-11-16 DIAGNOSIS — R928 Other abnormal and inconclusive findings on diagnostic imaging of breast: Secondary | ICD-10-CM | POA: Diagnosis not present

## 2018-11-16 DIAGNOSIS — Z853 Personal history of malignant neoplasm of breast: Secondary | ICD-10-CM

## 2018-11-16 DIAGNOSIS — Z881 Allergy status to other antibiotic agents status: Secondary | ICD-10-CM | POA: Insufficient documentation

## 2018-11-16 DIAGNOSIS — E7800 Pure hypercholesterolemia, unspecified: Secondary | ICD-10-CM | POA: Insufficient documentation

## 2018-11-16 DIAGNOSIS — G43909 Migraine, unspecified, not intractable, without status migrainosus: Secondary | ICD-10-CM | POA: Insufficient documentation

## 2018-11-16 DIAGNOSIS — Z87442 Personal history of urinary calculi: Secondary | ICD-10-CM | POA: Insufficient documentation

## 2018-11-16 DIAGNOSIS — Z833 Family history of diabetes mellitus: Secondary | ICD-10-CM | POA: Insufficient documentation

## 2018-11-16 DIAGNOSIS — Z818 Family history of other mental and behavioral disorders: Secondary | ICD-10-CM | POA: Insufficient documentation

## 2018-11-16 DIAGNOSIS — E1140 Type 2 diabetes mellitus with diabetic neuropathy, unspecified: Secondary | ICD-10-CM | POA: Diagnosis not present

## 2018-11-16 DIAGNOSIS — G8918 Other acute postprocedural pain: Secondary | ICD-10-CM | POA: Diagnosis not present

## 2018-11-16 HISTORY — PX: RADIOACTIVE SEED GUIDED PARTIAL MASTECTOMY WITH AXILLARY SENTINEL LYMPH NODE BIOPSY: SHX6520

## 2018-11-16 HISTORY — PX: BREAST LUMPECTOMY: SHX2

## 2018-11-16 LAB — GLUCOSE, CAPILLARY
Glucose-Capillary: 174 mg/dL — ABNORMAL HIGH (ref 70–99)
Glucose-Capillary: 170 mg/dL — ABNORMAL HIGH (ref 70–99)

## 2018-11-16 SURGERY — RADIOACTIVE SEED GUIDED PARTIAL MASTECTOMY WITH AXILLARY SENTINEL LYMPH NODE BIOPSY
Anesthesia: General | Site: Breast | Laterality: Right

## 2018-11-16 MED ORDER — BUPIVACAINE-EPINEPHRINE 0.25% -1:200000 IJ SOLN
INTRAMUSCULAR | Status: DC | PRN
  Administered 2018-11-16: 10 mL

## 2018-11-16 MED ORDER — FENTANYL CITRATE (PF) 100 MCG/2ML IJ SOLN
INTRAMUSCULAR | Status: AC
  Filled 2018-11-16: qty 2

## 2018-11-16 MED ORDER — ACETAMINOPHEN 500 MG PO TABS
1000.0000 mg | ORAL_TABLET | ORAL | Status: AC
  Administered 2018-11-16: 1000 mg via ORAL
  Filled 2018-11-16: qty 2

## 2018-11-16 MED ORDER — LACTATED RINGERS IV SOLN
INTRAVENOUS | Status: DC | PRN
  Administered 2018-11-16: 07:00:00 via INTRAVENOUS

## 2018-11-16 MED ORDER — PHENYLEPHRINE 40 MCG/ML (10ML) SYRINGE FOR IV PUSH (FOR BLOOD PRESSURE SUPPORT)
PREFILLED_SYRINGE | INTRAVENOUS | Status: AC
  Filled 2018-11-16: qty 10

## 2018-11-16 MED ORDER — ROCURONIUM BROMIDE 50 MG/5ML IV SOSY
PREFILLED_SYRINGE | INTRAVENOUS | Status: AC
  Filled 2018-11-16: qty 5

## 2018-11-16 MED ORDER — PROPOFOL 10 MG/ML IV BOLUS
INTRAVENOUS | Status: DC | PRN
  Administered 2018-11-16: 150 mg via INTRAVENOUS

## 2018-11-16 MED ORDER — CLINDAMYCIN PHOSPHATE 900 MG/50ML IV SOLN
900.0000 mg | INTRAVENOUS | Status: AC
  Administered 2018-11-16: 900 mg via INTRAVENOUS
  Filled 2018-11-16: qty 50

## 2018-11-16 MED ORDER — PROPOFOL 10 MG/ML IV BOLUS
INTRAVENOUS | Status: AC
  Filled 2018-11-16: qty 40

## 2018-11-16 MED ORDER — SUGAMMADEX SODIUM 200 MG/2ML IV SOLN
INTRAVENOUS | Status: DC | PRN
  Administered 2018-11-16: 200 mg via INTRAVENOUS

## 2018-11-16 MED ORDER — GABAPENTIN 300 MG PO CAPS
300.0000 mg | ORAL_CAPSULE | ORAL | Status: AC
  Administered 2018-11-16: 300 mg via ORAL
  Filled 2018-11-16: qty 1

## 2018-11-16 MED ORDER — FENTANYL CITRATE (PF) 250 MCG/5ML IJ SOLN
INTRAMUSCULAR | Status: AC
  Filled 2018-11-16: qty 5

## 2018-11-16 MED ORDER — FENTANYL CITRATE (PF) 100 MCG/2ML IJ SOLN
25.0000 ug | INTRAMUSCULAR | Status: DC | PRN
Start: 2018-11-16 — End: 2018-11-16
  Administered 2018-11-16 (×2): 50 ug via INTRAVENOUS

## 2018-11-16 MED ORDER — DEXAMETHASONE SODIUM PHOSPHATE 10 MG/ML IJ SOLN
INTRAMUSCULAR | Status: DC | PRN
  Administered 2018-11-16: 4 mg via INTRAVENOUS

## 2018-11-16 MED ORDER — 0.9 % SODIUM CHLORIDE (POUR BTL) OPTIME
TOPICAL | Status: DC | PRN
  Administered 2018-11-16: 1000 mL

## 2018-11-16 MED ORDER — LIDOCAINE 2% (20 MG/ML) 5 ML SYRINGE
INTRAMUSCULAR | Status: DC | PRN
  Administered 2018-11-16: 80 mg via INTRAVENOUS

## 2018-11-16 MED ORDER — PHENYLEPHRINE 40 MCG/ML (10ML) SYRINGE FOR IV PUSH (FOR BLOOD PRESSURE SUPPORT)
PREFILLED_SYRINGE | INTRAVENOUS | Status: DC | PRN
  Administered 2018-11-16 (×2): 80 ug via INTRAVENOUS

## 2018-11-16 MED ORDER — ONDANSETRON HCL 4 MG/2ML IJ SOLN
INTRAMUSCULAR | Status: DC | PRN
  Administered 2018-11-16: 4 mg via INTRAVENOUS

## 2018-11-16 MED ORDER — MIDAZOLAM HCL 5 MG/5ML IJ SOLN
INTRAMUSCULAR | Status: DC | PRN
  Administered 2018-11-16 (×2): 1 mg via INTRAVENOUS

## 2018-11-16 MED ORDER — MIDAZOLAM HCL 2 MG/2ML IJ SOLN
INTRAMUSCULAR | Status: AC
  Filled 2018-11-16: qty 2

## 2018-11-16 MED ORDER — CHLORHEXIDINE GLUCONATE CLOTH 2 % EX PADS: 6.0000 | MEDICATED_PAD | Freq: Once | CUTANEOUS | Status: DC

## 2018-11-16 MED ORDER — LIDOCAINE 2% (20 MG/ML) 5 ML SYRINGE
INTRAMUSCULAR | Status: AC
  Filled 2018-11-16: qty 5

## 2018-11-16 MED ORDER — FENTANYL CITRATE (PF) 250 MCG/5ML IJ SOLN
INTRAMUSCULAR | Status: DC | PRN
  Administered 2018-11-16 (×3): 50 ug via INTRAVENOUS

## 2018-11-16 MED ORDER — DEXAMETHASONE SODIUM PHOSPHATE 10 MG/ML IJ SOLN
INTRAMUSCULAR | Status: AC
  Filled 2018-11-16: qty 1

## 2018-11-16 MED ORDER — SODIUM CHLORIDE (PF) 0.9 % IJ SOLN
INTRAVENOUS | Status: DC | PRN
  Administered 2018-11-16: 5 mL via INTRAMUSCULAR

## 2018-11-16 MED ORDER — BUPIVACAINE-EPINEPHRINE (PF) 0.25% -1:200000 IJ SOLN
INTRAMUSCULAR | Status: AC
  Filled 2018-11-16: qty 30

## 2018-11-16 MED ORDER — ONDANSETRON HCL 4 MG/2ML IJ SOLN
INTRAMUSCULAR | Status: AC
  Filled 2018-11-16: qty 2

## 2018-11-16 MED ORDER — TECHNETIUM TC 99M SULFUR COLLOID FILTERED
1.0000 | Freq: Once | INTRAVENOUS | Status: AC | PRN
  Administered 2018-11-16: 1 via INTRADERMAL

## 2018-11-16 MED ORDER — ROCURONIUM BROMIDE 10 MG/ML (PF) SYRINGE
PREFILLED_SYRINGE | INTRAVENOUS | Status: DC | PRN
  Administered 2018-11-16: 50 mg via INTRAVENOUS

## 2018-11-16 SURGICAL SUPPLY — 39 items
APPLIER CLIP 9.375 MED OPEN (MISCELLANEOUS) ×2
BINDER BREAST LRG (GAUZE/BANDAGES/DRESSINGS) IMPLANT
BINDER BREAST XLRG (GAUZE/BANDAGES/DRESSINGS) IMPLANT
CANISTER SUCT 3000ML PPV (MISCELLANEOUS) IMPLANT
CHLORAPREP W/TINT 26ML (MISCELLANEOUS) ×2 IMPLANT
CLIP APPLIE 9.375 MED OPEN (MISCELLANEOUS) ×1 IMPLANT
CONT SPEC 4OZ CLIKSEAL STRL BL (MISCELLANEOUS) IMPLANT
COVER PROBE W GEL 5X96 (DRAPES) ×2 IMPLANT
COVER SURGICAL LIGHT HANDLE (MISCELLANEOUS) ×2 IMPLANT
COVER WAND RF STERILE (DRAPES) ×2 IMPLANT
DERMABOND ADVANCED (GAUZE/BANDAGES/DRESSINGS) ×1
DERMABOND ADVANCED .7 DNX12 (GAUZE/BANDAGES/DRESSINGS) ×1 IMPLANT
DEVICE DUBIN SPECIMEN MAMMOGRA (MISCELLANEOUS) ×2 IMPLANT
DRAPE CHEST BREAST 15X10 FENES (DRAPES) ×2 IMPLANT
ELECT CAUTERY BLADE 6.4 (BLADE) ×2 IMPLANT
ELECT REM PT RETURN 9FT ADLT (ELECTROSURGICAL) ×2
ELECTRODE REM PT RTRN 9FT ADLT (ELECTROSURGICAL) ×1 IMPLANT
GLOVE SURG SIGNA 7.5 PF LTX (GLOVE) ×4 IMPLANT
GOWN STRL REUS W/ TWL LRG LVL3 (GOWN DISPOSABLE) ×1 IMPLANT
GOWN STRL REUS W/ TWL XL LVL3 (GOWN DISPOSABLE) ×1 IMPLANT
GOWN STRL REUS W/TWL LRG LVL3 (GOWN DISPOSABLE) ×1
GOWN STRL REUS W/TWL XL LVL3 (GOWN DISPOSABLE) ×1
KIT BASIN OR (CUSTOM PROCEDURE TRAY) ×2 IMPLANT
KIT MARKER MARGIN INK (KITS) ×2 IMPLANT
NDL FILTER BLUNT 18X1 1/2 (NEEDLE) IMPLANT
NDL HYPO 25GX1X1/2 BEV (NEEDLE) ×1 IMPLANT
NDL SAFETY ECLIPSE 18X1.5 (NEEDLE) IMPLANT
NEEDLE FILTER BLUNT 18X 1/2SAF (NEEDLE)
NEEDLE FILTER BLUNT 18X1 1/2 (NEEDLE) IMPLANT
NEEDLE HYPO 18GX1.5 SHARP (NEEDLE)
NEEDLE HYPO 25GX1X1/2 BEV (NEEDLE) ×2 IMPLANT
NS IRRIG 1000ML POUR BTL (IV SOLUTION) IMPLANT
PACK GENERAL/GYN (CUSTOM PROCEDURE TRAY) ×2 IMPLANT
SUT MNCRL AB 4-0 PS2 18 (SUTURE) ×2 IMPLANT
SUT SILK 2 0 SH (SUTURE) IMPLANT
SUT VIC AB 3-0 SH 18 (SUTURE) ×2 IMPLANT
SYR CONTROL 10ML LL (SYRINGE) ×2 IMPLANT
TOWEL OR 17X24 6PK STRL BLUE (TOWEL DISPOSABLE) ×2 IMPLANT
TOWEL OR 17X26 10 PK STRL BLUE (TOWEL DISPOSABLE) ×2 IMPLANT

## 2018-11-16 NOTE — Anesthesia Postprocedure Evaluation (Signed)
Anesthesia Post Note  Patient: Lauren [name]  Procedure(s) Performed: RIGHT BREAST RADIOACTIVE SEED GUIDED PARTIAL MASTECTOMY WITH  SENTINEL  NODE BIOPSY (Right Breast)     Patient location during evaluation: PACU Anesthesia Type: General Level of consciousness: awake Pain management: pain level controlled Vital Signs Assessment: post-procedure vital signs reviewed and stable Respiratory status: spontaneous breathing Cardiovascular status: stable Postop Assessment: no apparent nausea or vomiting Anesthetic complications: no    Last Vitals:  Vitals:   11/16/18 1043 11/16/18 1045  BP: 137/71   Pulse: 77 72  Resp: 15 13  Temp:  (!) 36.4 C  SpO2: 90% 96%    Last Pain:  Vitals:   11/16/18 1015  TempSrc:   PainSc: Asleep                 Ayesha Markwell

## 2018-11-16 NOTE — Interval H&P Note (Signed)
History and Physical Interval Note:no change in H and P  11/16/2018 7:06 AM  Lauren [name]  has presented today for surgery, with the diagnosis of right breast cancer  The various methods of treatment have been discussed with the patient and family. After consideration of risks, benefits and other options for treatment, the patient has consented to  Procedure(s): RIGHT BREAST RADIOACTIVE SEED GUIDED PARTIAL MASTECTOMY WITH  SENTINEL  NODE BIOPSY (Right) as a surgical intervention .  The patient's history has been reviewed, patient examined, no change in status, stable for surgery.  I have reviewed the patient's chart and labs.  Questions were answered to the patient's satisfaction.     Coralie Keens

## 2018-11-16 NOTE — Anesthesia Procedure Notes (Signed)
Procedure Name: Intubation Date/Time: 11/16/2018 7:44 AM Performed by: Colin Benton, CRNA Pre-anesthesia Checklist: Patient identified, Emergency Drugs available, Suction available and Patient being monitored Patient Re-evaluated:Patient Re-evaluated prior to induction Oxygen Delivery Method: Circle system utilized Preoxygenation: Pre-oxygenation with 100% oxygen Induction Type: IV induction Ventilation: Mask ventilation without difficulty and Oral airway inserted - appropriate to patient size Laryngoscope Size: Sabra Heck and 2 Grade View: Grade II Tube type: Oral Tube size: 7.0 mm Number of attempts: 1 Airway Equipment and Method: Stylet Placement Confirmation: ETT inserted through vocal cords under direct vision,  positive ETCO2 and breath sounds checked- equal and bilateral Secured at: 23 cm Tube secured with: Tape Dental Injury: Teeth and Oropharynx as per pre-operative assessment

## 2018-11-16 NOTE — Anesthesia Procedure Notes (Addendum)
Anesthesia Regional Block: Pectoralis block   Pre-Anesthetic Checklist: ,, timeout performed, Correct Patient, Correct Site, Correct Laterality, Correct Procedure, Correct Position, site marked, Risks and benefits discussed,  Surgical consent,  Pre-op evaluation,  At surgeon's request and post-op pain management  Laterality: Right  Prep: chloraprep       Needles:   Needle Type: Stimulator Needle - 40          Additional Needles:   Procedures: Doppler guided,,,, ultrasound used (permanent image in chart),,,,  Narrative:  Start time: 11/16/2018 7:00 AM End time: 11/16/2018 7:15 AM Injection made incrementally with aspirations every 5 mL.  Performed by: Personally  Anesthesiologist: Belinda Block, MD

## 2018-11-16 NOTE — Op Note (Signed)
   Lauren [name] 11/16/2018   Pre-op Diagnosis: right breast cancer     Post-op Diagnosis: SAME  Procedure(s): RIGHT BREAST RADIOACTIVE SEED GUIDED PARTIAL MASTECTOMY WITH  DEEP RIGHT AXILLARY SENTINEL  NODE BIOPSY INJECTION OF BLUE DYE  Surgeon(s): Coralie Keens, MD  Anesthesia: General  Staff:  Circulator: Hulan Fess, RN Scrub Person: Narda Amber T, RN  Estimated Blood Loss: Minimal               Specimens: sent to path  Indications: This is a 73 year old female with a known right breast cancer in the upper outer quadrant of the right breast.  She now presents for a radioactive seed guided right breast partial mastectomy and sentinel lymph node biopsy  Procedure: The patient was brought to the operating room identifies correct patient.  She is placed upon the operating table and general anesthesia was induced.  I prepped the areola of the right breast and then injected blue dye underneath the areola and massaged the breast.  Her right breast, chest, and axilla were then prepped and draped in usual sterile fashion.  Using the neoprobe, identified an area of increased uptake in the upper outer quadrant of the right breast.  It was closer to the axilla then the areola.  I elected to made an axillary incision with a scalpel.  I did this after injecting the skin with Marcaine.  I then tunneled into the breast tissue medially with the aid of the neoprobe.  I was able to identify the radioactive seed and mass.  I performed a wide partial mastectomy of the upper outer quadrant of the breast going down to the chest wall to excise the lesion.  Once the specimen was removed, I marked all margins with marker paint.  An x-ray was performed showing that the lesion and previous clip as well as radioactive seed were in the specimen.  It was then sent to pathology for evaluation.  Through the same incision, I then used the neoprobe to identify the deep axilla and sentinel lymph node.  One  large lymph node was identified with the aid of the neoprobe in the deep axilla and was dissected free and excised.  No other enlarged lymph nodes were palpated.  There was no increased uptake of radioactive isotope.  At this point lymph node sent to pathology for evaluation.  I placed surgical clips in the breast tissue at the partial mastectomy cavity for marker purposes.  Hemostasis was achieved with cautery.  I anesthetized the wound further with Marcaine.  I then closed the subcutaneous tissue with interrupted 3-0 Vicryl sutures and closed skin with a running 4-0 Monocryl.  Dermabond was then applied.  The patient tolerated procedure well.  All the counts were correct at the end of the procedure.  The patient was then extubated in the operating room and taken in stable addition to the recovery room.          Coralie Keens   Date: 11/16/2018  Time: 8:46 AM

## 2018-11-16 NOTE — Discharge Instructions (Signed)
[place] Office Phone Number [phone]  BREAST BIOPSY/ PARTIAL MASTECTOMY: POST OP INSTRUCTIONS  Always review your discharge instruction sheet given to you by the facility where your surgery was performed.  IF YOU HAVE DISABILITY OR FAMILY LEAVE FORMS, YOU MUST BRING THEM TO THE OFFICE FOR PROCESSING.  DO NOT GIVE THEM TO YOUR DOCTOR.  1. A prescription for pain medication may be given to you upon discharge.  Take your pain medication as prescribed, if needed.  If narcotic pain medicine is not needed, then you may take acetaminophen (Tylenol) or ibuprofen (Advil) as needed. 2. Take your usually prescribed medications unless otherwise directed 3. If you need a refill on your pain medication, please contact your pharmacy.  They will contact our office to request authorization.  Prescriptions will not be filled after 5pm or on week-ends. 4. You should eat very light the first 24 hours after surgery, such as soup, crackers, pudding, etc.  Resume your normal diet the day after surgery. 5. Most patients will experience some swelling and bruising in the breast.  Ice packs and a good support bra will help.  Swelling and bruising can take several days to resolve.  6. It is common to experience some constipation if taking pain medication after surgery.  Increasing fluid intake and taking a stool softener will usually help or prevent this problem from occurring.  A mild laxative (Milk of Magnesia or Miralax) should be taken according to package directions if there are no bowel movements after 48 hours. 7. Unless discharge instructions indicate otherwise, you may remove your bandages 24-48 hours after surgery, and you may shower at that time.  You may have steri-strips (small skin tapes) in place directly over the incision.  These strips should be left on the skin for 7-10 days.  If your surgeon used skin glue on the incision, you may shower in 24 hours.  The glue will flake off over the  next 2-3 weeks.  Any sutures or staples will be removed at the office during your follow-up visit. 8. ACTIVITIES:  You may resume regular daily activities (gradually increasing) beginning the next day.  Wearing a good support bra or sports bra minimizes pain and swelling.  You may have sexual intercourse when it is comfortable. a. You may drive when you no longer are taking prescription pain medication, you can comfortably wear a seatbelt, and you can safely maneuver your car and apply brakes. b. RETURN TO WORK:  ______________________________________________________________________________________ 9. You should see your doctor in the office for a follow-up appointment approximately two weeks after your surgery.  Your doctors nurse will typically make your follow-up appointment when she calls you with your pathology report.  Expect your pathology report 2-3 business days after your surgery.  You may call to check if you do not hear from [place] after three days. 10. OTHER INSTRUCTIONS:OK TO SHOWER STARTING TOMORROW 11. ICE PACK, TYLENOL, IBUPROFEN ALSO FOR PAIN 12. NO VIGOROUS ACTIVITY FOR ONE WEEKS _______________________________________________________________________________________________ _____________________________________________________________________________________________________________________________________ _____________________________________________________________________________________________________________________________________ _____________________________________________________________________________________________________________________________________  WHEN TO CALL YOUR DOCTOR: 1. Fever over 101.0 2. Nausea and/or vomiting. 3. Extreme swelling or bruising. 4. Continued bleeding from incision. 5. Increased pain, redness, or drainage from the incision.  The clinic staff is available to answer your questions during regular business hours.  Please dont hesitate to call  and ask to speak to one of the nurses for clinical concerns.  If you have a medical emergency, go to the nearest emergency room or call 911.  A surgeon  from [facility] is always on call at the hospital.  For further questions, please visit [email or web address]

## 2018-11-16 NOTE — Transfer of Care (Signed)
Immediate Anesthesia Transfer of Care Note  Patient: Lauren [name]  Procedure(s) Performed: RIGHT BREAST RADIOACTIVE SEED GUIDED PARTIAL MASTECTOMY WITH  SENTINEL  NODE BIOPSY (Right Breast)  Patient Location: PACU  Anesthesia Type:GA combined with regional for post-op pain  Level of Consciousness: awake, alert , oriented and patient cooperative  Airway & Oxygen Therapy: Patient Spontanous Breathing and Patient connected to nasal cannula oxygen  Post-op Assessment: Report given to RN and Post -op Vital signs reviewed and stable  Post vital signs: Reviewed and stable  Last Vitals:  Vitals Value Taken Time  BP    Temp    Pulse    Resp    SpO2      Last Pain:  Vitals:   11/16/18 0639  TempSrc:   PainSc: 0-No pain         Complications: No apparent anesthesia complications

## 2018-11-17 ENCOUNTER — Encounter (HOSPITAL_COMMUNITY): Payer: Self-pay | Admitting: Surgery

## 2018-11-20 DIAGNOSIS — C50911 Malignant neoplasm of unspecified site of right female breast: Secondary | ICD-10-CM | POA: Diagnosis not present

## 2018-11-21 ENCOUNTER — Telehealth: Payer: Self-pay | Admitting: *Deleted

## 2018-11-21 NOTE — Telephone Encounter (Signed)
Received order for oncotype testing. Requisition faxed to pathology. Received by Keisha 

## 2018-11-23 ENCOUNTER — Other Ambulatory Visit (HOSPITAL_COMMUNITY): Payer: Self-pay | Admitting: Surgery

## 2018-11-23 ENCOUNTER — Ambulatory Visit (HOSPITAL_COMMUNITY)
Admission: RE | Admit: 2018-11-23 | Discharge: 2018-11-23 | Disposition: A | Discharge status: Home / Self Care | Payer: PPO | Source: Ambulatory Visit | Attending: Surgery | Admitting: Surgery

## 2018-11-23 DIAGNOSIS — M79601 Pain in right arm: Secondary | ICD-10-CM

## 2018-11-23 DIAGNOSIS — M7989 Other specified soft tissue disorders: Principal | ICD-10-CM

## 2018-11-23 NOTE — Progress Notes (Signed)
RUE venous duplex       has been completed. Preliminary results can be found under CV proc through chart review. Jaquelinne Glendening, BS, RDMS, RVT   

## 2018-11-24 ENCOUNTER — Encounter: Payer: Self-pay | Admitting: Genetic Counselor

## 2018-11-27 ENCOUNTER — Inpatient Hospital Stay (HOSPITAL_BASED_OUTPATIENT_CLINIC_OR_DEPARTMENT_OTHER): Payer: PPO | Admitting: Genetic Counselor

## 2018-11-27 ENCOUNTER — Inpatient Hospital Stay: Payer: PPO

## 2018-11-27 DIAGNOSIS — Z8042 Family history of malignant neoplasm of prostate: Secondary | ICD-10-CM | POA: Diagnosis not present

## 2018-11-27 DIAGNOSIS — Z1379 Encounter for other screening for genetic and chromosomal anomalies: Secondary | ICD-10-CM | POA: Diagnosis not present

## 2018-11-27 DIAGNOSIS — C50411 Malignant neoplasm of upper-outer quadrant of right female breast: Secondary | ICD-10-CM

## 2018-11-27 DIAGNOSIS — Z8051 Family history of malignant neoplasm of kidney: Secondary | ICD-10-CM | POA: Diagnosis not present

## 2018-11-27 DIAGNOSIS — Z800 Family history of malignant neoplasm of digestive organs: Secondary | ICD-10-CM

## 2018-11-27 DIAGNOSIS — Z803 Family history of malignant neoplasm of breast: Secondary | ICD-10-CM | POA: Diagnosis not present

## 2018-11-27 DIAGNOSIS — Z170 Estrogen receptor positive status [ER+]: Secondary | ICD-10-CM

## 2018-11-27 LAB — CBC WITH DIFFERENTIAL/PLATELET
Abs Immature Granulocytes: 0.04 10*3/uL (ref 0.00–0.07)
Basophils Absolute: 0.1 10*3/uL (ref 0.0–0.1)
Basophils Relative: 1 %
EOS PCT: 5 %
Eosinophils Absolute: 0.5 10*3/uL (ref 0.0–0.5)
HCT: 40.5 % (ref 36.0–46.0)
Hemoglobin: 12.6 g/dL (ref 12.0–15.0)
Immature Granulocytes: 0 %
Lymphocytes Relative: 21 %
Lymphs Abs: 2 10*3/uL (ref 0.7–4.0)
MCH: 27.6 pg (ref 26.0–34.0)
MCHC: 31.1 g/dL (ref 30.0–36.0)
MCV: 88.8 fL (ref 80.0–100.0)
MONOS PCT: 10 %
Monocytes Absolute: 0.9 10*3/uL (ref 0.1–1.0)
Neutro Abs: 5.8 10*3/uL (ref 1.7–7.7)
Neutrophils Relative %: 63 %
Platelets: 289 10*3/uL (ref 150–400)
RBC: 4.56 MIL/uL (ref 3.87–5.11)
RDW: 14.3 % (ref 11.5–15.5)
WBC: 9.3 10*3/uL (ref 4.0–10.5)
nRBC: 0 % (ref 0.0–0.2)

## 2018-11-27 LAB — COMPREHENSIVE METABOLIC PANEL
ALT: 22 U/L (ref 0–44)
AST: 31 U/L (ref 15–41)
Albumin: 3.7 g/dL (ref 3.5–5.0)
Alkaline Phosphatase: 68 U/L (ref 38–126)
Anion gap: 9 (ref 5–15)
BUN: 13 mg/dL (ref 8–23)
CO2: 28 mmol/L (ref 22–32)
CREATININE: 1.07 mg/dL — AB (ref 0.44–1.00)
Calcium: 9.6 mg/dL (ref 8.9–10.3)
Chloride: 104 mmol/L (ref 98–111)
GFR calc Af Amer: 60 mL/min — ABNORMAL LOW (ref >60)
GFR calc non Af Amer: 51 mL/min — ABNORMAL LOW (ref >60)
Glucose, Bld: 247 mg/dL — ABNORMAL HIGH (ref 70–99)
Potassium: 4.9 mmol/L (ref 3.5–5.1)
Sodium: 141 mmol/L (ref 135–145)
Total Bilirubin: 0.3 mg/dL (ref 0.3–1.2)
Total Protein: 7.1 g/dL (ref 6.5–8.1)

## 2018-11-28 ENCOUNTER — Encounter: Payer: Self-pay | Admitting: Genetic Counselor

## 2018-11-28 DIAGNOSIS — Z803 Family history of malignant neoplasm of breast: Secondary | ICD-10-CM | POA: Insufficient documentation

## 2018-11-28 DIAGNOSIS — Z800 Family history of malignant neoplasm of digestive organs: Secondary | ICD-10-CM | POA: Insufficient documentation

## 2018-11-28 DIAGNOSIS — Z8051 Family history of malignant neoplasm of kidney: Secondary | ICD-10-CM | POA: Insufficient documentation

## 2018-11-28 DIAGNOSIS — Z8042 Family history of malignant neoplasm of prostate: Secondary | ICD-10-CM | POA: Insufficient documentation

## 2018-11-28 NOTE — Progress Notes (Signed)
REFERRING PROVIDER: Chauncey Cruel, MD [address] [place], [place] [address]  PRIMARY PROVIDER:  Lawerance Cruel, MD  PRIMARY REASON FOR VISIT:  1. Malignant neoplasm of upper-outer quadrant of right breast in female, estrogen receptor positive ([place])   2. Family history of breast cancer   3. Family history of prostate cancer   4. Family history of colon cancer   5. Family history of kidney cancer      HISTORY OF PRESENT ILLNESS:   Lauren [name], a 73 y.o. female, was seen for a [facility] cancer genetics consultation at the request of Dr. Jana Hakim due to a personal and family history of cancer.  Lauren [name] presents to clinic today to discuss the possibility of a hereditary predisposition to cancer, genetic testing, and to further clarify her future cancer risks, as well as potential cancer risks for family members.   In January 2020, at the age of 44, Lauren [name] was diagnosed with cancer of the right breast. The tumor is ER+/PR+/Her2-.    CANCER HISTORY:   No history exists.     HORMONAL RISK FACTORS:  Menarche was at age 46.  First live birth at age 14.  OCP use for approximately <5 years.  Ovaries intact: yes.  Hysterectomy: no.  Menopausal status: postmenopausal.  HRT use: 5 years. Colonoscopy: yes; normal. Mammogram within the last year: yes. Number of breast biopsies: 2. Up to date with pelvic exams:  yes. Any excessive radiation exposure in the past:  no  Past Medical History:  Diagnosis Date  . Arthritis    Back, Hip- right  . Cancer ([place])    right breast  . Cataract   . Diabetes mellitus without complication ([place])    Type II  . Endometriosis   . Family history of adverse reaction to anesthesia    Father - N/V - blood pressure; daughter N/V  . Family history of breast cancer   . Family history of colon cancer   . Family history of kidney cancer   . Family history of prostate cancer   . GERD (gastroesophageal reflux disease)   .  History of kidney stones   . History of ulcerative colitis   . Hypertension   . Insomnia   . 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 Neuropathy   . Pneumonia    1983, 2003  . PVCs (premature ventricular contractions)    benign  . Umbilical hernia   . Vitamin D deficiency   . Wears glasses     Past Surgical History:  Procedure Laterality Date  . APPENDECTOMY    . BREAST BIOPSY  08-13-2009  DR TSUEI   EXCISION LEFT NIPPLE DUCT  . BREAST EXCISIONAL BIOPSY Left    x2  . BREAST EXCISIONAL BIOPSY Right   . CHOLECYSTECTOMY  1992  . COLON RESECTION  1986   ULCERATIVE COLITIS  . COLON SURGERY    . ESOPHAGEAL MANOMETRY N/A 06/11/2013   Procedure: ESOPHAGEAL MANOMETRY (EM);  Surgeon: Jeryl Columbia, MD;  Location: [facility];  Service: Endoscopy;  Laterality: N/A;  . ESOPHAGOGASTRODUODENOSCOPY (EGD) WITH PROPOFOL N/A 05/31/2016   Procedure: ESOPHAGOGASTRODUODENOSCOPY (EGD) WITH PROPOFOL;  Surgeon: Clarene Essex, MD;  Location: [facility] ENDOSCOPY;  Service: Endoscopy;  Laterality: N/A;  . EXCISION RIGHT BREAST MASS   10-20-2005  DR Marylene Buerger  . FRACTURE SURGERY     left arm  . kidney stone removed  2/11  . KNEE ARTHROSCOPY Left    MCL  .  LEFT THUMB CARPOMETACARPAL JOINT SUSPENSIONPLASTY  04-06-2006  DR Burney Gauze  . LEFT WRIST ARTHROSCOPY W/ DEBRIDEMENT AND REMOVAL CYST  03-26-2004  DR Burney Gauze  . MYOMECTOMY    . RADIOACTIVE SEED GUIDED PARTIAL MASTECTOMY WITH AXILLARY SENTINEL LYMPH NODE BIOPSY Right 11/16/2018   Procedure: RIGHT BREAST RADIOACTIVE SEED GUIDED PARTIAL MASTECTOMY WITH  SENTINEL  NODE BIOPSY;  Surgeon: Coralie Keens, MD;  Location: [facility];  Service: General;  Laterality: Right;  . RIGHT COLECTOMY    . RIGHT URETEROSCOPIC STONE EXTRACTION  12-11-2009  DR Irine Seal  . TENDON RELEASE    . TONSILLECTOMY    . TOTAL HIP ARTHROPLASTY Right 11/26/2016   Procedure: RIGHT TOTAL HIP ARTHROPLASTY ANTERIOR APPROACH;  Surgeon: Mcarthur Rossetti, MD;  Location: [facility];  Service: Orthopedics;   Laterality: Right;  . TOTAL KNEE ARTHROPLASTY Left 12/02/2017   Procedure: LEFT TOTAL KNEE ARTHROPLASTY;  Surgeon: Mcarthur Rossetti, MD;  Location: [facility];  Service: Orthopedics;  Laterality: Left;  . WRIST SURGERY     both    Social History   Socioeconomic History  . Marital status: Married    Spouse name: Not on file  . Number of children: Not on file  . Years of education: Not on file  . Highest education level: Not on file  Occupational History  . Not on file  Social Needs  . Financial resource strain: Not on file  . Food insecurity:    Worry: Not on file    Inability: Not on file  . Transportation needs:    Medical: No    Non-medical: No  Tobacco Use  . Smoking status: Former Smoker    Packs/day: 1.00    Years: 10.00    Pack years: 10.00    Types: Cigarettes    Last attempt to quit: 09/22/1982    Years since quitting: 36.2  . Smokeless tobacco: Never Used  . Tobacco comment: quit 1983  Substance and Sexual Activity  . Alcohol use: No  . Drug use: No  . Sexual activity: Yes    Partners: Male    Birth control/protection: Post-menopausal  Lifestyle  . Physical activity:    Days per week: Not on file    Minutes per session: Not on file  . Stress: Not on file  Relationships  . Social connections:    Talks on phone: Not on file    Gets together: Not on file    Attends religious service: Not on file    Active member of club or organization: Not on file    Attends meetings of clubs or organizations: Not on file    Relationship status: Not on file  Other Topics Concern  . Not on file  Social History Narrative   Epworth Sleepiness Scale = 4 (as of 09/12/15)     FAMILY HISTORY:  We obtained a detailed, 4-generation family history.  Significant diagnoses are listed below: Family History  Problem Relation Age of Onset  . Hypertension Father   . Heart disease Father   . Diabetes Father   . Cancer Father        colon, prostate, and kidney  .  Hypertension Mother   . Parkinson's disease Mother   . Heart disease Maternal Grandmother   . Diabetes Maternal Grandmother   . Heart disease Maternal Grandfather   . Diabetes Maternal Grandfather   . Cancer Maternal Grandfather        pt unaware of what kind  . Stroke Paternal Grandmother   .  Stroke Paternal Grandfather   . Colon cancer Paternal Aunt        dx less than 77  . Cancer Maternal Aunt        cervical vs ovarian  . Prostate cancer Paternal Uncle   . Prostate cancer Other        PGF's father  . Breast cancer Paternal Aunt        dx in her 24s  . Breast cancer Cousin        pat first cousin, dx over 40    The patient has two children who are cancer free, but both have MS.  She is an only child.  Both parents are deceased.  The patient's mother died of parkinson's disease.  She has one sister who is living and had either cervical vs ovarian cancer.  The maternal grandparents are deceased.  The grandfather had an unknown cancer.  The maternal grandparents were first cousins.  The patient's father had prostate cancer around age 42, kidney cancer in his 7's and colon cancer in his 44's.  Her father had four brothers and two sisters.  One sister had colon cancer under the age of 52, and one sister had breast cancer.  One brother had prostate cancer, and a brother who did not have cancer had a daughter with breast cancer.  The paternal grandparents died of old age, but the grandfather's father had prostate cancer.  Ms. Stefanko is unaware of previous family history of genetic testing for hereditary cancer risks. Patient's ancestors are of [place], [place] and [place] descent. There is no reported Ashkenazi Jewish ancestry. There is no known consanguinity.  GENETIC COUNSELING ASSESSMENT: Lauren [name] is a 73 y.o. female with a personal and family history of cancer which is somewhat suggestive of a hereditary cancer syndrome and predisposition to cancer. We, therefore, discussed  and recommended the following at today's visit.   DISCUSSION: We discussed that about 5-10% of breast cancer is hereditary with most cases due to BRCA mutations.  There are other genes that can increase the risk for the cancers in her family. We discussed both smaller and larger cancer panel.  We reviewed the characteristics, features and inheritance patterns of hereditary cancer syndromes. We also discussed genetic testing, including the appropriate family members to test, the process of testing, insurance coverage and turn-around-time for results. We discussed the implications of a negative, positive and/or variant of uncertain significant result. We recommended Lauren [name] pursue genetic testing for the multi cancer gene panel. The Multi-Gene Panel offered by [facility] includes sequencing and/or deletion duplication testing of the following 84 genes: AIP, ALK, APC, ATM, AXIN2,BAP1,  BARD1, BLM, BMPR1A, BRCA1, BRCA2, BRIP1, CASR, CDC73, CDH1, CDK4, CDKN1B, CDKN1C, CDKN2A (p14ARF), CDKN2A (p16INK4a), CEBPA, CHEK2, CTNNA1, DICER1, DIS3L2, EGFR (c.2369C>T, p.Thr790Met variant only), EPCAM (Deletion/duplication testing only), FH, FLCN, GATA2, GPC3, GREM1 (Promoter region deletion/duplication testing only), HOXB13 (c.251G>A, p.Gly84Glu), HRAS, KIT, MAX, MEN1, MET, MITF (c.952G>A, p.Glu318Lys variant only), MLH1, MSH2, MSH3, MSH6, MUTYH, NBN, NF1, NF2, NTHL1, PALB2, PDGFRA, PHOX2B, PMS2, POLD1, POLE, POT1, PRKAR1A, PTCH1, PTEN, RAD50, RAD51C, RAD51D, RB1, RECQL4, RET, RUNX1, SDHAF2, SDHA (sequence changes only), SDHB, SDHC, SDHD, SMAD4, SMARCA4, SMARCB1, SMARCE1, STK11, SUFU, TERC, TERT, TMEM127, TP53, TSC1, TSC2, VHL, WRN and WT1.    Based on Lauren [name]'s personal and family history of cancer, she meets medical criteria for genetic testing. Despite that she meets criteria, she may still have an out of pocket cost. We discussed that if her out of pocket cost  for testing is over $100, the laboratory will call and  confirm whether she wants to proceed with testing.  If the out of pocket cost of testing is less than $100 she will be billed by the genetic testing laboratory.   PLAN: After considering the risks, benefits, and limitations, Lauren [name]  provided informed consent to pursue genetic testing and the blood sample was sent to [facility] for analysis of the multi-cancer panel. Results should be available within approximately 2-3 weeks' time, at which point they will be disclosed by telephone to Lauren [name], as will any additional recommendations warranted by these results. Lauren [name] will receive a summary of her genetic counseling visit and a copy of her results once available. This information will also be available in Epic. We encouraged Lauren [name] to remain in contact with cancer genetics annually so that we can continuously update the family history and inform her of any changes in cancer genetics and testing that may be of benefit for her family. Lauren [name]'s questions were answered to her satisfaction today. Our contact information was provided should additional questions or concerns arise.  Lastly, we encouraged Lauren [name] to remain in contact with cancer genetics annually so that we can continuously update the family history and inform her of any changes in cancer genetics and testing that may be of benefit for this family.   Ms.  [name]'s questions were answered to her satisfaction today. Our contact information was provided should additional questions or concerns arise. Thank you for the referral and allowing [place] to share in the care of your patient.   Karen P. Florene Glen, [place], [facility] Certified Genetic Counselor [name] phone: [phone]  The patient was seen for a total of 45 minutes in face-to-face genetic counseling.  This patient was discussed with Drs. Magrinat, Lindi Adie and/or Burr Medico who agrees with the above.     _______________________________________________________________________ For Office Staff:  Number of people involved in session: 2 Was an Intern/ student involved with case: no

## 2018-11-29 DIAGNOSIS — C50411 Malignant neoplasm of upper-outer quadrant of right female breast: Secondary | ICD-10-CM | POA: Diagnosis not present

## 2018-11-29 DIAGNOSIS — Z170 Estrogen receptor positive status [ER+]: Secondary | ICD-10-CM | POA: Diagnosis not present

## 2018-12-01 ENCOUNTER — Telehealth: Payer: Self-pay | Admitting: *Deleted

## 2018-12-01 NOTE — Telephone Encounter (Signed)
Received oncotype score 12/3%.  Patient aware.  Referral sent Dr. Lisbeth Renshaw.

## 2018-12-05 ENCOUNTER — Telehealth: Payer: Self-pay | Admitting: Genetic Counselor

## 2018-12-05 NOTE — Telephone Encounter (Signed)
Revealed negative genetic testing.  Discussed that we do not know why she has breast cancer or why there is cancer in the family. It could be due to a different gene that we are not testing, or maybe our current technology may not be able to pick something up.  It will be important for her to keep in contact with genetics to keep up with whether additional testing may be needed.  We discussed that there was one VUS, but this is still considered a normal result.

## 2018-12-06 ENCOUNTER — Other Ambulatory Visit: Payer: Self-pay | Admitting: Oncology

## 2018-12-07 ENCOUNTER — Encounter: Payer: Self-pay | Admitting: Genetic Counselor

## 2018-12-07 ENCOUNTER — Ambulatory Visit: Payer: Self-pay | Admitting: Genetic Counselor

## 2018-12-07 DIAGNOSIS — Z1379 Encounter for other screening for genetic and chromosomal anomalies: Secondary | ICD-10-CM | POA: Insufficient documentation

## 2018-12-07 DIAGNOSIS — C50411 Malignant neoplasm of upper-outer quadrant of right female breast: Secondary | ICD-10-CM

## 2018-12-07 DIAGNOSIS — Z170 Estrogen receptor positive status [ER+]: Secondary | ICD-10-CM

## 2018-12-07 NOTE — Progress Notes (Addendum)
HPI:  Lauren [name] was previously seen in the [place] clinic due to a personal and family history of cancer and concerns regarding a hereditary predisposition to cancer. Please refer to our prior cancer genetics clinic note for more information regarding Lauren [name]'s medical, social and family histories, and our assessment and recommendations, at the time. Lauren [name]'s recent genetic test results were disclosed to her, as were recommendations warranted by these results. These results and recommendations are discussed in more detail below.  CANCER HISTORY:    Malignant neoplasm of upper-outer quadrant of right breast in female, estrogen receptor positive (Lauren [name])   11/09/2018 Initial Diagnosis    Malignant neoplasm of upper-outer quadrant of right breast in female, estrogen receptor positive (Lauren [name])    12/04/2018 Genetic Testing    Negative genetic testing on the multicancer panel.  The Multi-Gene Panel offered by [facility] includes sequencing and/or deletion duplication testing of the following 84 genes: AIP, ALK, APC, ATM, AXIN2,BAP1,  BARD1, BLM, BMPR1A, BRCA1, BRCA2, BRIP1, CASR, CDC73, CDH1, CDK4, CDKN1B, CDKN1C, CDKN2A (p14ARF), CDKN2A (p16INK4a), CEBPA, CHEK2, CTNNA1, DICER1, DIS3L2, EGFR (c.2369C>T, p.Thr790Met variant only), EPCAM (Deletion/duplication testing only), FH, FLCN, GATA2, GPC3, GREM1 (Promoter region deletion/duplication testing only), HOXB13 (c.251G>A, p.Gly84Glu), HRAS, KIT, MAX, MEN1, MET, MITF (c.952G>A, p.Glu318Lys variant only), MLH1, MSH2, MSH3, MSH6, MUTYH, NBN, NF1, NF2, NTHL1, PALB2, PDGFRA, PHOX2B, PMS2, POLD1, POLE, POT1, PRKAR1A, PTCH1, PTEN, RAD50, RAD51C, RAD51D, RB1, RECQL4, RET, RUNX1, SDHAF2, SDHA (sequence changes only), SDHB, SDHC, SDHD, SMAD4, SMARCA4, SMARCB1, SMARCE1, STK11, SUFU, TERC, TERT, TMEM127, TP53, TSC1, TSC2, VHL, WRN and WT1.  The report date is [id number].     FAMILY HISTORY:  We obtained a detailed, 4-generation family history.   Significant diagnoses are listed below: Family History  Problem Relation Age of Onset  . Hypertension Father   . Heart disease Father   . Diabetes Father   . Cancer Father        colon, prostate, and kidney  . Hypertension Mother   . Parkinson's disease Mother   . Heart disease Maternal Grandmother   . Diabetes Maternal Grandmother   . Heart disease Maternal Grandfather   . Diabetes Maternal Grandfather   . Cancer Maternal Grandfather        pt unaware of what kind  . Stroke Paternal Grandmother   . Stroke Paternal Grandfather   . Colon cancer Paternal Aunt        dx less than 56  . Cancer Maternal Aunt        cervical vs ovarian  . Prostate cancer Paternal Uncle   . Prostate cancer Other        PGF's father  . Breast cancer Paternal Aunt        dx in her 81s  . Breast cancer Cousin        pat first cousin, dx over 39    The patient has two children who are cancer free, but both have MS.  She is an only child.  Both parents are deceased.  The patient's mother died of parkinson's disease.  She has one sister who is living and had either cervical vs ovarian cancer.  The maternal grandparents are deceased.  The grandfather had an unknown cancer.  The maternal grandparents were first cousins.  The patient's father had prostate cancer around age 73, kidney cancer in his 51's and colon cancer in his 100's.  Her father had four brothers and two sisters.  One sister had colon cancer under the age  of 58, and one sister had breast cancer.  One brother had prostate cancer, and a brother who did not have cancer had a daughter with breast cancer.  The paternal grandparents died of old age, but the grandfather's father had prostate cancer.  Lauren [name] is unaware of previous family history of genetic testing for hereditary cancer risks. Patient's ancestors are of [place], [place] and [place] descent. There is no reported Ashkenazi Jewish ancestry. There is no known consanguinity.   GENETIC  TEST RESULTS: Genetic testing reported out on December 04, 2018 through the multi cancer cancer panel found no deleterious mutations.  The Multi-Gene Panel offered by [facility] includes sequencing and/or deletion duplication testing of the following 84 genes: AIP, ALK, APC, ATM, AXIN2,BAP1,  BARD1, BLM, BMPR1A, BRCA1, BRCA2, BRIP1, CASR, CDC73, CDH1, CDK4, CDKN1B, CDKN1C, CDKN2A (p14ARF), CDKN2A (p16INK4a), CEBPA, CHEK2, CTNNA1, DICER1, DIS3L2, EGFR (c.2369C>T, p.Thr790Met variant only), EPCAM (Deletion/duplication testing only), FH, FLCN, GATA2, GPC3, GREM1 (Promoter region deletion/duplication testing only), HOXB13 (c.251G>A, p.Gly84Glu), HRAS, KIT, MAX, MEN1, MET, MITF (c.952G>A, p.Glu318Lys variant only), MLH1, MSH2, MSH3, MSH6, MUTYH, NBN, NF1, NF2, NTHL1, PALB2, PDGFRA, PHOX2B, PMS2, POLD1, POLE, POT1, PRKAR1A, PTCH1, PTEN, RAD50, RAD51C, RAD51D, RB1, RECQL4, RET, RUNX1, SDHAF2, SDHA (sequence changes only), SDHB, SDHC, SDHD, SMAD4, SMARCA4, SMARCB1, SMARCE1, STK11, SUFU, TERC, TERT, TMEM127, TP53, TSC1, TSC2, VHL, WRN and WT1.  The test report has been scanned into EPIC and is located under the Molecular Pathology section of the Results Review tab.    We discussed with Lauren [name] that since the current genetic testing is not perfect, it is possible there may be a gene mutation in one of these genes that current testing cannot detect, but that chance is small.  We also discussed, that it is possible that another gene that has not yet been discovered, or that we have not yet tested, is responsible for the cancer diagnoses in the family, and it is, therefore, important to remain in touch with cancer genetics in the future so that we can continue to offer Lauren [name] the most up to date genetic testing.   Genetic testing did detect a Variant of Unknown Significance in the DICER1 gene called c.2118C>T (Silent).  At this time, it is unknown if this variant is associated with increased cancer risk or if this is  a normal finding, but most variants such as this get reclassified to being inconsequential. It should not be used to make medical management decisions. With time, we suspect the lab will determine the significance of this variant, if any. If we do learn more about it, we will try to contact Ms. Radick to discuss it further. However, it is important to stay in touch with [place] periodically and keep the address and phone number up to date.   Update:  DICER1 c.2118C>T (Silent) has been reclassified to Likely Benign as a result of re-review of the evidence in light of new variant interpretation guidelines and/or new information.  The updated report date is 02/15/2019.  CANCER SCREENING RECOMMENDATIONS: This result is reassuring and indicates that Ms. Bills likely does not have an increased risk for a future cancer due to a mutation in one of these genes. This normal test also suggests that Ms. Riker's cancer was most likely not due to an inherited predisposition associated with one of these genes.  Most cancers happen by chance and this negative test suggests that her cancer falls into this category.  We, therefore, recommended she continue to follow the cancer management and  screening guidelines provided by her oncology and primary healthcare provider.   An individual's cancer risk and medical management are not determined by genetic test results alone. Overall cancer risk assessment incorporates additional factors, including personal medical history, family history, and any available genetic information that may result in a personalized plan for cancer prevention and surveillance.  RECOMMENDATIONS FOR FAMILY MEMBERS:  Women in this family might be at some increased risk of developing cancer, over the general population risk, simply due to the family history of cancer.  We recommended women in this family have a yearly mammogram beginning at age 32, or 45 years younger than the earliest onset of cancer, an  annual clinical breast exam, and perform monthly breast self-exams. Women in this family should also have a gynecological exam as recommended by their primary provider. All family members should have a colonoscopy by age 68.  FOLLOW-UP: Lastly, we discussed with Ms. Minetti that cancer genetics is a rapidly advancing field and it is possible that new genetic tests will be appropriate for her and/or her family members in the future. We encouraged her to remain in contact with cancer genetics on an annual basis so we can update her personal and family histories and let her know of advances in cancer genetics that may benefit this family.   Our contact number was provided. Ms. Fausto's questions were answered to her satisfaction, and she knows she is welcome to call us at anytime with additional questions or concerns.   Roma Kayser, MS, [facility] Certified Genetic Counselor [name]

## 2018-12-08 ENCOUNTER — Inpatient Hospital Stay: Payer: PPO | Admitting: Oncology

## 2018-12-11 ENCOUNTER — Encounter: Payer: Self-pay | Admitting: Oncology

## 2018-12-11 NOTE — Progress Notes (Signed)
  Location of Breast Cancer: Rt Breast  Histology per Pathology Report: 11/16/18 Diagnosis 1. Breast, lumpectomy, Right w/seed - INVASIVE DUCTAL CARCINOMA, 1.2 CM, NOTTINGHAM GRADE 2 OF 3. - MARGINS OF RESECTION ARE NOT INVOLVED (CLOSEST MARGIN: 1.5 MM, ANTERIOR). - BIOPSY SITE CHANGES. - SEE ONCOLOGY TABLE. 2. Lymph node, sentinel, biopsy, Right - ONE LYMPH NODE, NEGATIVE FOR CARCINOMA (0/1).  Receptor Status: 11/16/18 Estrogen Receptor: 100%, positive, strong Progesterone Receptor: 20%, positive, strong HER2: By IHC: Equivocal (2+). By FISH: Negative. Ratio of HER2/CEN17 signals = 1.26. Average HER2 copy number per 10/18/18 GROUP 5: HER2 **NEGATIVE** Did patient present with symptoms (if so, please note symptoms) or was this found on screening mammography?:   Past/Anticipated interventions by surgeon, if any: 11/16/18  Dr [name] RIGHT BREAST RADIOACTIVE SEED GUIDED PARTIAL MASTECTOMY WITH SENTINEL NODE BIOPSY  Past/Anticipated interventions by medical oncology, if any: Chemotherapy   Lymphedema issues, if any:  Some  Pain issues, if any:  Under rt arm  SAFETY ISSUES:  Prior radiation? No  Pacemaker/ICD?  No  Possible current pregnancy? No  Is the patient on methotrexate? No  Current Complaints / other details:      Vitals:   12/12/18 1007  BP: (!) 152/86  Pulse: 84  Resp: 20  Temp: 98.3 F (36.8 C)  TempSrc: Oral  SpO2: 95%  Weight: 218 lb 4 oz (99 kg)   Wt Readings from Last 3 Encounters:  12/12/18 218 lb 4 oz (99 kg)  12/12/18 218 lb 6.4 oz (99.1 kg)  11/16/18 217 lb (98.4 kg)    Mardene Sayer, LPN 05/07/6150,8:34 AM

## 2018-12-12 ENCOUNTER — Ambulatory Visit
Admission: RE | Admit: 2018-12-12 | Discharge: 2018-12-12 | Disposition: A | Discharge status: Home / Self Care | Payer: PPO | Source: Ambulatory Visit | Attending: Radiation Oncology | Admitting: Radiation Oncology

## 2018-12-12 ENCOUNTER — Other Ambulatory Visit: Payer: Self-pay

## 2018-12-12 ENCOUNTER — Encounter: Payer: Self-pay | Admitting: Radiation Oncology

## 2018-12-12 ENCOUNTER — Ambulatory Visit: Payer: PPO | Admitting: Radiation Oncology

## 2018-12-12 ENCOUNTER — Encounter (HOSPITAL_COMMUNITY): Payer: Self-pay | Admitting: Oncology

## 2018-12-12 VITALS — BP 152/86 | HR 84 | Temp 98.3°F | Resp 20 | Wt 218.2 lb

## 2018-12-12 DIAGNOSIS — N6459 Other signs and symptoms in breast: Secondary | ICD-10-CM | POA: Diagnosis not present

## 2018-12-12 DIAGNOSIS — C50411 Malignant neoplasm of upper-outer quadrant of right female breast: Secondary | ICD-10-CM | POA: Insufficient documentation

## 2018-12-12 DIAGNOSIS — M79621 Pain in right upper arm: Secondary | ICD-10-CM | POA: Diagnosis not present

## 2018-12-12 DIAGNOSIS — Z9889 Other specified postprocedural states: Secondary | ICD-10-CM | POA: Diagnosis not present

## 2018-12-12 DIAGNOSIS — E119 Type 2 diabetes mellitus without complications: Secondary | ICD-10-CM | POA: Insufficient documentation

## 2018-12-12 DIAGNOSIS — Z79899 Other long term (current) drug therapy: Secondary | ICD-10-CM | POA: Insufficient documentation

## 2018-12-12 DIAGNOSIS — Z7982 Long term (current) use of aspirin: Secondary | ICD-10-CM | POA: Insufficient documentation

## 2018-12-12 DIAGNOSIS — Z87891 Personal history of nicotine dependence: Secondary | ICD-10-CM | POA: Diagnosis not present

## 2018-12-12 DIAGNOSIS — Z808 Family history of malignant neoplasm of other organs or systems: Secondary | ICD-10-CM | POA: Diagnosis not present

## 2018-12-12 DIAGNOSIS — M1990 Unspecified osteoarthritis, unspecified site: Secondary | ICD-10-CM | POA: Diagnosis not present

## 2018-12-12 DIAGNOSIS — Z170 Estrogen receptor positive status [ER+]: Secondary | ICD-10-CM | POA: Diagnosis not present

## 2018-12-12 NOTE — Progress Notes (Addendum)
Radiation Oncology         [phone] ________________________________  Name: [name]        MRN: [id number]  Date of Service: 12/12/2018 DOB: 04/28/1946  DE:YCXK, Lauren Luo, MD  Coralie Keens, MD     REFERRING PHYSICIAN: Coralie Keens, MD   DIAGNOSIS: The encounter diagnosis was Malignant neoplasm of upper-outer quadrant of right breast in female, estrogen receptor positive ([place]).   HISTORY OF PRESENT ILLNESS: Lauren [name] is a 73 y.o. female who was recently seen for a new diagnosis of right breast cancer. 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  Diagnostic imaging revealed a 9 x 9 x 8 mm mass at the 10:30 o'clock position in her axilla was also evaluated and was negative for adenopathy.  She underwent biopsy on 10/18/2018 which revealed a grade 2 invasive ductal carcinoma.  Her tumor was ER PR positive, HER-2 negative, and her Ki-67 was 5%.  She underwent right lumpectomy with sentinel node biopsy on 11/16/2018 that revealed a 1.2 cm, grade 2 invasive ductal carcinoma. Her margins were negative, and her single node that was sampled was negative for malignancy. Her tumor was also tested for oncotype and her scare was 12. She will not need chemotherapy. She comes today to discuss options of adjuvant radiotherapy.    PREVIOUS RADIATION THERAPY: No   PAST MEDICAL HISTORY:  Past Medical History:  Diagnosis Date  . Arthritis    Back, Hip- right  . Cancer ([place])    right breast  . Cataract   . Diabetes mellitus without complication ([place])    Type II  . Endometriosis   . Family history of adverse reaction to anesthesia    Father - N/V - blood pressure; daughter N/V  . Family history of breast cancer   . Family history of colon cancer   . Family history of kidney cancer   . Family history of prostate cancer   . GERD  (gastroesophageal reflux disease)   . History of kidney stones   . History of ulcerative colitis   . Hypertension   . Insomnia   . 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 Neuropathy   . Pneumonia    1983, 2003  . PVCs (premature ventricular contractions)    benign  . Umbilical hernia   . Vitamin D deficiency   . Wears glasses        PAST SURGICAL HISTORY: Past Surgical History:  Procedure Laterality Date  . APPENDECTOMY    . BREAST BIOPSY  08-13-2009  DR TSUEI   EXCISION LEFT NIPPLE DUCT  . BREAST EXCISIONAL BIOPSY Left    x2  . BREAST EXCISIONAL BIOPSY Right   . CHOLECYSTECTOMY  1992  . COLON RESECTION  1986   ULCERATIVE COLITIS  . COLON SURGERY    . ESOPHAGEAL MANOMETRY N/A 06/11/2013   Procedure: ESOPHAGEAL MANOMETRY (EM);  Surgeon: Jeryl Columbia, MD;  Location: [facility];  Service: Endoscopy;  Laterality: N/A;  . ESOPHAGOGASTRODUODENOSCOPY (EGD) WITH PROPOFOL N/A 05/31/2016   Procedure: ESOPHAGOGASTRODUODENOSCOPY (EGD) WITH PROPOFOL;  Surgeon: Clarene Essex, MD;  Location: [facility] ENDOSCOPY;  Service: Endoscopy;  Laterality: N/A;  . EXCISION RIGHT BREAST MASS   10-20-2005  DR Marylene Buerger  . FRACTURE SURGERY     left arm  . kidney stone removed  2/11  .  KNEE ARTHROSCOPY Left    MCL  . LEFT THUMB CARPOMETACARPAL JOINT SUSPENSIONPLASTY  04-06-2006  DR Burney Gauze  . LEFT WRIST ARTHROSCOPY W/ DEBRIDEMENT AND REMOVAL CYST  03-26-2004  DR Burney Gauze  . MYOMECTOMY    . RADIOACTIVE SEED GUIDED PARTIAL MASTECTOMY WITH AXILLARY SENTINEL LYMPH NODE BIOPSY Right 11/16/2018   Procedure: RIGHT BREAST RADIOACTIVE SEED GUIDED PARTIAL MASTECTOMY WITH  SENTINEL  NODE BIOPSY;  Surgeon: Coralie Keens, MD;  Location: [facility];  Service: General;  Laterality: Right;  . RIGHT COLECTOMY    . RIGHT URETEROSCOPIC STONE EXTRACTION  12-11-2009  DR Irine Seal  . TENDON RELEASE    . TONSILLECTOMY    . TOTAL HIP ARTHROPLASTY Right 11/26/2016   Procedure: RIGHT TOTAL HIP ARTHROPLASTY ANTERIOR APPROACH;  Surgeon:  Mcarthur Rossetti, MD;  Location: [facility] OR[facility];  Service: Orthopedics;  Laterality: Right;  . TOTAL KNEE ARTHROPLASTY Left 12/02/2017   Procedure: LEFT TOTAL KNEE ARTHROPLASTY;  Surgeon: Mcarthur Rossetti, MD;  Location: [facility];  Service: Orthopedics;  Laterality: Left;  . WRIST SURGERY     both     FAMILY HISTORY:  Family History  Problem Relation Age of Onset  . Hypertension Father   . Heart disease Father   . Diabetes Father   . Cancer Father        colon, prostate, and kidney  . Hypertension Mother   . Parkinson's disease Mother   . Heart disease Maternal Grandmother   . Diabetes Maternal Grandmother   . Heart disease Maternal Grandfather   . Diabetes Maternal Grandfather   . Cancer Maternal Grandfather        pt unaware of what kind  . Stroke Paternal Grandmother   . Stroke Paternal Grandfather   . Colon cancer Paternal Aunt        dx less than 81  . Cancer Maternal Aunt        cervical vs ovarian  . Prostate cancer Paternal Uncle   . Prostate cancer Other        PGF's father  . Breast cancer Paternal Aunt        dx in her 32s  . Breast cancer Cousin        pat first cousin, dx over 74     SOCIAL HISTORY:  reports that she quit smoking about 36 years ago. Her smoking use included cigarettes. She has a 10.00 pack-year smoking history. She has never used smokeless tobacco. She reports that she does not drink alcohol or use drugs.  The patient is married and lives in [place]. Her daughter used to work in our department about 10 years ago.   ALLERGIES: Ciprofloxacin hcl; Percocet [oxycodone-acetaminophen]; Penicillins; Sulfa antibiotics; Tape; and Tetanus toxoids   MEDICATIONS:  Current Outpatient Medications  Medication Sig Dispense Refill  . amLODipine (NORVASC) 5 MG tablet TAKE 1 TABLET BY MOUTH ONCE DAILY *NEED  OFFICE  VISIT* (Patient taking differently: Take 5 mg by mouth daily. ) 90 tablet 3  . aspirin EC 81 MG tablet Take 81 mg by mouth daily.    Marland Kitchen b  complex vitamins tablet Take 1 tablet by mouth daily.    . balsalazide (COLAZAL) 750 MG capsule Take 2,250 mg by mouth 3 (three) times daily.     . Cholecalciferol (HM VITAMIN D3) 2000 units CAPS Take 2,000 Units by mouth daily.     . cholestyramine (QUESTRAN) 4 GM/DOSE powder Take 4 g by mouth daily.     . furosemide (LASIX) 20 MG tablet  Take 20 mg by mouth daily.     Marland Kitchen gabapentin (NEURONTIN) 300 MG capsule Take 600 mg by mouth at bedtime.   1  . HYDROcodone-acetaminophen (NORCO) 10-325 MG tablet Take 1 tablet by mouth 3 (three) times daily as needed for moderate pain.     Marland Kitchen lidocaine (ASPERCREME W/LIDOCAINE) 4 % cream Apply 1 application topically daily as needed (for joint pain).     . magnesium gluconate (MAGONATE) 500 MG tablet Take 500 mg by mouth at bedtime.    . metFORMIN (GLUCOPHAGE-XR) 500 MG 24 hr tablet Take 1,000 mg by mouth 2 (two) times daily.     . methocarbamol (ROBAXIN) 500 MG tablet Take 1 tablet (500 mg total) by mouth every 6 (six) hours as needed for muscle spasms. (Patient taking differently: Take 500 mg by mouth at bedtime. ) 60 tablet 0  . Misc Natural Products (JOINT HEALTH) CAPS Take 300 mg by mouth daily. Arthrocen 300 mg Supplement    . Oxymetazoline HCl (NASAL SPRAY NA) Place 1-2 sprays into both nostrils at bedtime as needed (for allergies).    . pantoprazole (PROTONIX) 40 MG tablet Take 40 mg by mouth 2 (two) times daily.     . pioglitazone (ACTOS) 15 MG tablet Take 15 mg by mouth daily.    . quinapril (ACCUPRIL) 40 MG tablet Take 40 mg by mouth daily.    . rosuvastatin (CRESTOR) 20 MG tablet TAKE 1 TABLET BY MOUTH EVERY DAY (Patient taking differently: Take 20 mg by mouth daily. ) 90 tablet 1  . Lancets (ONETOUCH DELICA PLUS [id number]) MISC     . ONETOUCH VERIO test strip      No current facility-administered medications for this encounter.      REVIEW OF SYSTEMS: On review of systems, the patient reports that she is doing well overall. She does report that  she has had some significant pain in her posterior upper arm since surgery. She feels like it is "raw" or like she has a "rug burn" in the location, though her skin is intact. She is concerned about some edema of her right breast. She was seen last week by her surgeon and has plans to follow up again next week. She denies any chest pain, shortness of breath, cough, fevers, chills, night sweats, unintended weight changes. She denies any new bowel or bladder disturbances, and denies abdominal pain, nausea or vomiting. She has a history of colitis and reports that her episodes of urgent bowel movements continue but are unchanged. She mentions that she is claustrophobic. She denies any new musculoskeletal or joint aches or pains. A complete review of systems is obtained and is otherwise negative.     PHYSICAL EXAM:  Wt Readings from Last 3 Encounters:  12/12/18 218 lb 4 oz (99 kg)  12/12/18 218 lb 6.4 oz (99.1 kg)  11/16/18 217 lb (98.4 kg)   Temp Readings from Last 3 Encounters:  12/12/18 98.3 F (36.8 C) (Oral)  12/12/18 98.3 F (36.8 C) (Oral)  11/16/18 (!) 97.5 F (36.4 C)   BP Readings from Last 3 Encounters:  12/12/18 (!) 152/86  12/12/18 (!) 152/86  11/16/18 127/63   Pulse Readings from Last 3 Encounters:  12/12/18 84  12/12/18 84  11/16/18 75    In general this is a well appearing Caucasian female in no acute distress. She is alert and oriented x4 and appropriate throughout the examination. HEENT reveals that the patient is normocephalic, atraumatic. EOMs are intact. Skin is intact without any  evidence of gross lesions. Cardiopulmonary assessment is negative for acute distress and she exhibits normal effort. The right breast revealed tightness and swelling, her right breast is about twice the size of her left, and she states that previously they were about the same size. No cellulitic appearing changes are noted. The incision site is well healed in the upper outer quadrant of the  right breast without any separation or bleeding. No RUE edema is noted.   ECOG = 0  0 - Asymptomatic (Fully active, able to carry on all predisease activities without restriction)  1 - Symptomatic but completely ambulatory (Restricted in physically strenuous activity but ambulatory and able to carry out work of a light or sedentary nature. For example, light housework, office work)  2 - Symptomatic, <50% in bed during the day (Ambulatory and capable of all self care but unable to carry out any work activities. Up and about more than 50% of waking hours)  3 - Symptomatic, >50% in bed, but not bedbound (Capable of only limited self-care, confined to bed or chair 50% or more of waking hours)  4 - Bedbound (Completely disabled. Cannot carry on any self-care. Totally confined to bed or chair)  5 - Death   Eustace Pen MM, Creech RH, Tormey DC, et al. [phone]). "Toxicity and response criteria of the [facility]". [place] Oncol. 5 (6): 649-55    LABORATORY DATA:  Lab Results  Component Value Date   WBC 9.3 11/27/2018   HGB 12.6 11/27/2018   HCT 40.5 11/27/2018   MCV 88.8 11/27/2018   PLT 289 11/27/2018   Lab Results  Component Value Date   NA 141 11/27/2018   K 4.9 11/27/2018   CL 104 11/27/2018   CO2 28 11/27/2018   Lab Results  Component Value Date   ALT 22 11/27/2018   AST 31 11/27/2018   ALKPHOS 68 11/27/2018   BILITOT 0.3 11/27/2018      RADIOGRAPHY: Nm Sentinel Node Inj-no Rpt (breast)  Result Date: 11/16/2018 Sulfur colloid was injected by the nuclear medicine technologist for melanoma sentinel node.   Mm Breast Surgical Specimen  Result Date: 11/16/2018 CLINICAL DATA:  Evaluate specimen EXAM: SPECIMEN RADIOGRAPH OF THE RIGHT BREAST COMPARISON:  Previous exam(s). FINDINGS: Status post excision of the right breast. The radioactive seed and biopsy marker clip are present, completely intact, and were marked for pathology. IMPRESSION: Specimen  radiograph of the right breast. Electronically Signed   By: Dorise Bullion III M.D   On: 11/16/2018 08:22   Mm Rt Radioactive Seed Loc Mammo Guide  Result Date: 11/14/2018 CLINICAL DATA:  The patient presents for seed localization prior to lumpectomy for invasive ductal carcinoma in the RIGHT breast. EXAM: MAMMOGRAPHIC GUIDED RADIOACTIVE SEED LOCALIZATION OF THE RIGHT BREAST COMPARISON:  Previous exam(s). FINDINGS: Patient presents for radioactive seed localization prior to lumpectomy. I met with the patient and we discussed the procedure of seed localization including benefits and alternatives. We discussed the high likelihood of a successful procedure. We discussed the risks of the procedure including infection, bleeding, tissue injury and further surgery. We discussed the low dose of radioactivity involved in the procedure. Informed, written consent was given. The usual time-out protocol was performed immediately prior to the procedure. Using mammographic guidance, sterile technique, 1% lidocaine and an I-125 radioactive seed, the ribbon shaped clip in the RIGHT breast was localized using a superior to inferior approach. The follow-up mammogram images confirm the seed in the expected location and were marked  for Dr. [name]. Follow-up survey of the patient confirms presence of the radioactive seed. Order number of I-125 seed:  [id number]. Total activity:  7.106 millicurie reference Date: 10/16/2018 The patient tolerated the procedure well and was released from the [facility]. She was given instructions regarding seed removal. IMPRESSION: Radioactive seed localization right breast. No apparent complications. Electronically Signed   By: Nolon Nations M.D.   On: 11/14/2018 15:58   Vas [place] Upper Extremity Venous Duplex  Result Date: 11/23/2018 UPPER VENOUS STUDY  Other Indications: Posterior arm pain post mastectomy. Performing Technologist: June Leap RDMS, RVT  Examination Guidelines: A complete  evaluation includes B-mode imaging, spectral Doppler, color Doppler, and power Doppler as needed of all accessible portions of each vessel. Bilateral testing is considered an integral part of a complete examination. Limited examinations for reoccurring indications may be performed as noted.  Right Findings: +----------+------------+----------+---------+-----------+-------+ RIGHT     CompressiblePropertiesPhasicitySpontaneousSummary +----------+------------+----------+---------+-----------+-------+ IJV           Full                 Yes       Yes            +----------+------------+----------+---------+-----------+-------+ Subclavian    Full                 Yes       Yes            +----------+------------+----------+---------+-----------+-------+ Axillary      Full                 Yes       Yes            +----------+------------+----------+---------+-----------+-------+ Brachial      Full                 Yes       Yes            +----------+------------+----------+---------+-----------+-------+ Radial        Full                                          +----------+------------+----------+---------+-----------+-------+ Ulnar         Full                                          +----------+------------+----------+---------+-----------+-------+ Cephalic      Full                                          +----------+------------+----------+---------+-----------+-------+ Basilic       Full                                          +----------+------------+----------+---------+-----------+-------+  Summary:  Right: No evidence of deep vein thrombosis in the upper extremity. No evidence of superficial vein thrombosis in the upper extremity.  *See table(s) above for measurements and observations.  Diagnosing physician: Monica Martinez MD Electronically signed by Monica Martinez MD on 11/23/2018 at 7:24:17 PM.    Final        IMPRESSION/PLAN: 1. Stage  IA, [id number] ER/PR invasive ductal carcinoma of the right breast. We discussed the pathology findings and I reviewed the nature of invasive breast disease. She is healing, but still has some lymphatic congestion of the breast. I recommended that she be evaluated by PT and she was referred for urgent evaluation. She would benefit from adjuvant external radiotherapy to the breast followed by antiestrogen therapy. We discussed the risks, benefits, short, and long term effects of radiotherapy, and the patient is interested in proceeding. Dr. Lisbeth Renshaw has previously anticipated a course of 4 radiotherapy. We will coordinate simulation to occur in at least a week to allow her breast edema to resolve. Written consent is obtained and placed in the chart, a copy was provided to the patient. 2. Breast edema. As per #1. We will arrange for referral to PT.  3. Right axillary and posterior upper arm neuropathic pain. The patient was advised to increase her gabapentin to 300 mg q am and q lunch time, and continue her 600 mg dose q HS. She is in agreement to see if this helps. I will notify Dr. Nicholaus Bloom, her pain management physician so we don't violate any pain contract she has with this change in recommendations.   In a visit lasting 30 minutes, greater than 50% of the time was spent face to face discussing her case, and coordinating the patient's care.     Carola Rhine, PAC

## 2018-12-12 NOTE — Addendum Note (Signed)
Encounter addended by: Hayden Pedro, PA-C on: 12/12/2018 2:05 PM  Actions taken: Clinical Note Signed

## 2018-12-13 ENCOUNTER — Telehealth: Payer: Self-pay | Admitting: *Deleted

## 2018-12-13 NOTE — Telephone Encounter (Signed)
Called [facility] to arrange an urgent appt. for this patient , lvm for a return call

## 2018-12-14 ENCOUNTER — Other Ambulatory Visit: Payer: Self-pay | Admitting: Radiation Oncology

## 2018-12-14 ENCOUNTER — Telehealth: Payer: Self-pay | Admitting: *Deleted

## 2018-12-14 DIAGNOSIS — Z170 Estrogen receptor positive status [ER+]: Secondary | ICD-10-CM

## 2018-12-14 DIAGNOSIS — C50411 Malignant neoplasm of upper-outer quadrant of right female breast: Secondary | ICD-10-CM

## 2018-12-14 NOTE — Telephone Encounter (Signed)
Called patient to inform of PT appt. for 12-19-18 @ 4 pm @ [facility], lvm for a return call

## 2018-12-15 ENCOUNTER — Telehealth: Payer: Self-pay | Admitting: *Deleted

## 2018-12-15 NOTE — Telephone Encounter (Signed)
Called patient to inform of PT appt. for 12-19-18 - arrival time- 3:45 pm @ [facility], spoke with patient and she is aware of this appt.

## 2018-12-19 ENCOUNTER — Ambulatory Visit: Payer: PPO | Admitting: Physical Therapy

## 2018-12-19 ENCOUNTER — Encounter

## 2018-12-20 ENCOUNTER — Ambulatory Visit: Payer: PPO | Admitting: Physical Therapy

## 2018-12-21 ENCOUNTER — Other Ambulatory Visit: Payer: Self-pay

## 2018-12-21 ENCOUNTER — Ambulatory Visit: Payer: PPO | Attending: Radiation Oncology | Admitting: Physical Therapy

## 2018-12-21 ENCOUNTER — Encounter: Payer: Self-pay | Admitting: Physical Therapy

## 2018-12-21 DIAGNOSIS — R600 Localized edema: Secondary | ICD-10-CM

## 2018-12-21 DIAGNOSIS — M79621 Pain in right upper arm: Secondary | ICD-10-CM | POA: Diagnosis not present

## 2018-12-21 DIAGNOSIS — Z483 Aftercare following surgery for neoplasm: Secondary | ICD-10-CM | POA: Insufficient documentation

## 2018-12-21 NOTE — Patient Instructions (Signed)
Horizontal Adduction    Stretch shoulder inward: 1. Pull arm with other hand until elbow comes to midline. 2. Extend arms in front at shoulder height, crossed at elbow, then clasp hands. Hold __10__ seconds. Repeat, switching arms. Repeat __5__ times. Do __2__ sessions per day.   Copyright  VHI. All rights reserved.  Triceps Stretch    Bend right arm and place left hand on right elbow. Gently stretch right elbow to the left. Hold position for _10 seconds. Switch arms and repeat. Repeat _5__ times, alternating arms. Do __3_ times per day.  Copyright  VHI. All rights reserved.

## 2018-12-21 NOTE — Therapy (Signed)
[place], [place], [address] Phone: [phone]   Fax:  [phone]  Physical Therapy Evaluation  Patient Details  Name: Lauren [name] MRN: [id number] Date of Birth: 02/10/1946 Referring Provider (PT): Shona Simpson, [place]   Encounter Date: 12/21/2018  PT End of Session - 12/21/18 1158    Visit Number  1    Number of Visits  8    Date for PT Re-Evaluation  01/18/19    PT Start Time  1101    PT Stop Time  1157    PT Time Calculation (min)  56 min    Activity Tolerance  Patient tolerated treatment well    Behavior During Therapy  [facility] for tasks assessed/performed       Past Medical History:  Diagnosis Date  . Arthritis    Back, Hip- right  . Cancer ([place])    right breast  . Cataract   . Diabetes mellitus without complication ([place])    Type II  . Endometriosis   . Family history of adverse reaction to anesthesia    Father - N/V - blood pressure; daughter N/V  . Family history of breast cancer   . Family history of colon cancer   . Family history of kidney cancer   . Family history of prostate cancer   . GERD (gastroesophageal reflux disease)   . History of kidney stones   . History of ulcerative colitis   . Hypertension   . Insomnia   . 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 Neuropathy   . Pneumonia    1983, 2003  . PVCs (premature ventricular contractions)    benign  . Umbilical hernia   . Vitamin D deficiency   . Wears glasses     Past Surgical History:  Procedure Laterality Date  . APPENDECTOMY    . BREAST BIOPSY  08-13-2009  DR TSUEI   EXCISION LEFT NIPPLE DUCT  . BREAST EXCISIONAL BIOPSY Left    x2  . BREAST EXCISIONAL BIOPSY Right   . CHOLECYSTECTOMY  1992  . COLON RESECTION  1986   ULCERATIVE COLITIS  . COLON SURGERY    . ESOPHAGEAL MANOMETRY N/A 06/11/2013   Procedure: ESOPHAGEAL MANOMETRY (EM);  Surgeon: Jeryl Columbia, MD;  Location: [facility];  Service: Endoscopy;   Laterality: N/A;  . ESOPHAGOGASTRODUODENOSCOPY (EGD) WITH PROPOFOL N/A 05/31/2016   Procedure: ESOPHAGOGASTRODUODENOSCOPY (EGD) WITH PROPOFOL;  Surgeon: Clarene Essex, MD;  Location: [facility] ENDOSCOPY;  Service: Endoscopy;  Laterality: N/A;  . EXCISION RIGHT BREAST MASS   10-20-2005  DR Marylene Buerger  . FRACTURE SURGERY     left arm  . kidney stone removed  2/11  . KNEE ARTHROSCOPY Left    MCL  . LEFT THUMB CARPOMETACARPAL JOINT SUSPENSIONPLASTY  04-06-2006  DR Burney Gauze  . LEFT WRIST ARTHROSCOPY W/ DEBRIDEMENT AND REMOVAL CYST  03-26-2004  DR Burney Gauze  . MYOMECTOMY    . RADIOACTIVE SEED GUIDED PARTIAL MASTECTOMY WITH AXILLARY SENTINEL LYMPH NODE BIOPSY Right 11/16/2018   Procedure: RIGHT BREAST RADIOACTIVE SEED GUIDED PARTIAL MASTECTOMY WITH  SENTINEL  NODE BIOPSY;  Surgeon: Coralie Keens, MD;  Location: [facility];  Service: General;  Laterality: Right;  . RIGHT COLECTOMY    . RIGHT URETEROSCOPIC STONE EXTRACTION  12-11-2009  DR Irine Seal  . TENDON RELEASE    . TONSILLECTOMY    . TOTAL HIP ARTHROPLASTY Right 11/26/2016   Procedure: RIGHT TOTAL HIP ARTHROPLASTY ANTERIOR APPROACH;  Surgeon: Lind Guest  [name], MD;  Location: [facility];  Service: Orthopedics;  Laterality: Right;  . TOTAL KNEE ARTHROPLASTY Left 12/02/2017   Procedure: LEFT TOTAL KNEE ARTHROPLASTY;  Surgeon: Mcarthur Rossetti, MD;  Location: [facility];  Service: Orthopedics;  Laterality: Left;  . WRIST SURGERY     both    There were no vitals filed for this visit.   Subjective Assessment - 12/21/18 1107    Subjective  Patient reports right posterior arm pain since her right lumpectomy and sentinel node biopsy 11/16/2018. Her axillary node was negative, Oncotype was low and she will have radiation simulation 12/26/2018. She also c/o right breast edema.    Patient is accompained by:  Family member   Husband with Alzheimers   Pertinent History  Right lumpectomy and sentinel node biopsy (0/1 nodes positive) 11/16/2018. Left TKR 12/2017;  right THR 11/2017. Colon resection 1986.    Patient Stated Goals  decrease right arm pain; decrease right breast edema    Currently in Pain?  Yes    Pain Score  4     Pain Location  Arm    Pain Orientation  Right;Upper;Posterior    Pain Descriptors / Indicators  Burning    Pain Type  Surgical pain;Neuropathic pain    Pain Onset  1 to 4 weeks ago    Pain Frequency  Intermittent    Aggravating Factors   anything touching the skin    Pain Relieving Factors  Neurotin    Multiple Pain Sites  No         [facility] PT Assessment - 12/21/18 0001      Assessment   Medical Diagnosis  Right arm pain and right breast edema    Referring Provider (PT)  Shona Simpson, PA-C    Onset Date/Surgical Date  11/16/18    Hand Dominance  Right    Next MD Visit  12/26/2018    Prior Therapy  None      Precautions   Precautions  Other (comment)    Precaution Comments  recent breast surgery for cancer      Restrictions   Weight Bearing Restrictions  No      Balance Screen   Has the patient fallen in the past 6 months  No    Has the patient had a decrease in activity level because of a fear of falling?   No    Is the patient reluctant to leave their home because of a fear of falling?   No      Home Film/video editor residence    Living Arrangements  Spouse/significant other   Husband has Alzheimers   Available Help at Discharge  Family      Prior Function   Level of [place]  Retired    Leisure  She does not exercise      Cognition   Overall Cognitive Status  Within Functional Limits for tasks assessed      Observation/Other Assessments   Observations  Right posterior upper arm is hypersensitive to light touch but pain is reduced with deep pressure and squeezing the upper arm area.      Posture/Postural Control   Posture/Postural Control  Postural limitations    Postural Limitations  Rounded Shoulders;Forward head      ROM / Strength    AROM / PROM / Strength  AROM;Strength      AROM   AROM Assessment Site  Shoulder    Right/Left  Shoulder  Right;Left    Right Shoulder Extension  46 Degrees    Right Shoulder Flexion  150 Degrees    Right Shoulder ABduction  157 Degrees    Right Shoulder Internal Rotation  77 Degrees    Right Shoulder External Rotation  73 Degrees    Left Shoulder Extension  60 Degrees    Left Shoulder Flexion  150 Degrees    Left Shoulder ABduction  164 Degrees    Left Shoulder Internal Rotation  59 Degrees    Left Shoulder External Rotation  85 Degrees      Palpation   Palpation comment  Incision appears to be healing well; negative neural tension        LYMPHEDEMA/ONCOLOGY QUESTIONNAIRE - 12/21/18 1120      Type   Cancer Type  Right breast cancer      Surgeries   Lumpectomy Date  11/16/18    Sentinel Lymph Node Biopsy Date  11/16/18    Number Lymph Nodes Removed  1      Treatment   Active Chemotherapy Treatment  No    Past Chemotherapy Treatment  No    Active Radiation Treatment  No    Past Radiation Treatment  No    Current Hormone Treatment  No    Past Hormone Therapy  No      What other symptoms do you have   Are you Having Heaviness or Tightness  Yes    Are you having Pain  Yes    Are you having pitting edema  Yes    Body Site  right breast    Is it Hard or Difficult finding clothes that fit  No    Do you have infections  No    Is there Decreased scar mobility  Yes    Stemmer Sign  No      Lymphedema Assessments   Lymphedema Assessments  Upper extremities      Right Upper Extremity Lymphedema   15 cm Proximal to Olecranon Process  33.9 cm    10 cm Proximal to Olecranon Process  33.6 cm    Olecranon Process  26.8 cm    15 cm Proximal to Ulnar Styloid Process  26.3 cm    10 cm Proximal to Ulnar Styloid Process  24.1 cm    Just Proximal to Ulnar Styloid Process  16.7 cm    Across Hand at [employer]  19.4 cm    At [place] of 2nd Digit  6.7 cm      Left Upper  Extremity Lymphedema   15 cm Proximal to Olecranon Process  32.9 cm    10 cm Proximal to Olecranon Process  31.2 cm    Olecranon Process  26.3 cm    15 cm Proximal to Ulnar Styloid Process  26 cm    10 cm Proximal to Ulnar Styloid Process  23.8 cm    Just Proximal to Ulnar Styloid Process  16.9 cm    Across Hand at [employer]  18.3 cm    At [place] of 2nd Digit  6.3 cm      Note right breast edema below:      Katina Dung - 12/21/18 0001    Open a tight or new jar  Moderate difficulty    Do heavy household chores (wash walls, wash floors)  Moderate difficulty    Carry a shopping bag or briefcase  Moderate difficulty    Wash your back  Mild difficulty  Use a knife to cut food  No difficulty    Recreational activities in which you take some force or impact through your arm, shoulder, or hand (golf, hammering, tennis)  Moderate difficulty    During the past week, to what extent has your arm, shoulder or hand problem interfered with your normal social activities with family, friends, neighbors, or groups?  Modererately    During the past week, to what extent has your arm, shoulder or hand problem limited your work or other regular daily activities  Modererately    Arm, shoulder, or hand pain.  Severe    Tingling (pins and needles) in your arm, shoulder, or hand  Severe    Difficulty Sleeping  Moderate difficulty    DASH Score  47.73 %        Objective measurements completed on examination: See above findings.              PT Education - 12/21/18 1157    Education Details  Right tricep stretching    Person(s) Educated  Patient;Spouse    Methods  Explanation;Demonstration;Handout    Comprehension  Returned demonstration;Verbalized understanding          PT Long Term Goals - 12/21/18 1214      PT LONG TERM GOAL #1   Title  Pt will be independent with home exericse program.    Time  4    Period  Weeks    Status  New      PT LONG TERM GOAL #2   Title   Patient will improve DASH score to </= 20 for improved right arm function.    Baseline  47.73 at eval    Time  4    Period  Weeks    Status  New      PT LONG TERM GOAL #3   Title  Patient will report >/= 40% improvement in right upper arm hypersensitivity and pain.    Time  4    Period  Weeks    Status  New      PT LONG TERM GOAL #4   Title  Patient will verbalize understnading of risk reduction practices for right arm lymphedema.    Time  4    Period  Weeks    Status  New      PT LONG TERM GOAL #5   Title  Patient will report >/= 25% improvement in right breast swelling.    Time  4    Period  Weeks    Status  New             Plan - 12/21/18 1159    Clinical Impression Statement  Patient is 1 month s/p right lumpectomy and SLNB. She is having significnat right upper posterior arm pain which hypersensitivity to light touch. She also has mild right breast edema. She is here to try to alleviate those symptoms. She begins right breast radiation with simulation on 12/26/2018. She will benefit from physical therapy to decrease pain and edema.    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    Clinical Presentation  Evolving    Clinical Presentation due to:  Right upper arm symptoms are worsening and not decreasing as normally seen post operatively; about to start radiation which may exaccerbate symptoms    Clinical Decision Making  Moderate    Rehab Potential  Good  Clinical Impairments Affecting Rehab Potential  Radiation may conflict; schedule unknown at time of eval    PT Frequency  2x / week    PT Duration  4 weeks    PT Treatment/Interventions  ADLs/Self Care Home Management;Therapeutic activities;Therapeutic exercise;Patient/family education;Passive range of motion;Scar mobilization;Manual lymph drainage;Manual techniques    PT Next Visit Plan  Begin nerve desesitization for right  posterior upper arm; manual lymph drainage may help upper arm pain and also MLD on breast    PT Home Exercise Plan  Tricep stretching    Consulted and Agree with Plan of Care  Patient;Family member/caregiver    Family Member Consulted  Husband       Patient will benefit from skilled therapeutic intervention in order to improve the following deficits and impairments:  Increased fascial restricitons, Impaired UE functional use, Decreased knowledge of precautions, Pain, Increased edema, Impaired sensation, Postural dysfunction  Visit Diagnosis: Pain in right upper arm - Plan: PT plan of care cert/re-cert  Aftercare following surgery for neoplasm - Plan: PT plan of care cert/re-cert  Localized edema - Plan: PT plan of care cert/re-cert     Problem List Patient Active Problem List   Diagnosis Date Noted  . Genetic testing 12/07/2018  . Family history of breast cancer   . Family history of prostate cancer   . Family history of colon cancer   . Family history of kidney cancer   . Malignant neoplasm of upper-outer quadrant of right breast in female, estrogen receptor positive ([place]) 11/09/2018  . Diabetic neuropathy, type II diabetes mellitus ([place]) 11/09/2018  . 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/02/2017  . Trochanteric bursitis, right hip 05/10/2017  . Primary osteoarthritis of left knee 03/09/2017  . Unilateral primary osteoarthritis, right hip 11/26/2016  . Status post total replacement of right hip 11/26/2016  . DDD (degenerative disc disease), 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  . Low back pain 04/23/2015  . Hiatal hernia 04/13/2013  . Type II or unspecified type diabetes mellitus without mention of complication, uncontrolled 01/26/2013  . Umbilical hernia 01/60/1093  . Hypertension    Annia Friendly, [place] 12/21/18 12:18 PM  [place], [place], [address] Phone: [phone]   Fax:   [phone]  Name: Lauren [name] MRN: [id number] Date of Birth: Dec 21, 1945

## 2018-12-23 DIAGNOSIS — E1165 Type 2 diabetes mellitus with hyperglycemia: Secondary | ICD-10-CM | POA: Diagnosis not present

## 2018-12-23 DIAGNOSIS — C50919 Malignant neoplasm of unspecified site of unspecified female breast: Secondary | ICD-10-CM | POA: Diagnosis not present

## 2018-12-23 DIAGNOSIS — I10 Essential (primary) hypertension: Secondary | ICD-10-CM | POA: Diagnosis not present

## 2018-12-23 DIAGNOSIS — E1169 Type 2 diabetes mellitus with other specified complication: Secondary | ICD-10-CM | POA: Diagnosis not present

## 2018-12-26 ENCOUNTER — Ambulatory Visit
Admission: RE | Admit: 2018-12-26 | Discharge: 2018-12-26 | Disposition: A | Discharge status: Home / Self Care | Payer: PPO | Source: Ambulatory Visit | Attending: Radiation Oncology | Admitting: Radiation Oncology

## 2018-12-26 DIAGNOSIS — Z170 Estrogen receptor positive status [ER+]: Secondary | ICD-10-CM | POA: Diagnosis not present

## 2018-12-26 DIAGNOSIS — C50411 Malignant neoplasm of upper-outer quadrant of right female breast: Secondary | ICD-10-CM | POA: Diagnosis not present

## 2018-12-26 DIAGNOSIS — Z510 Encounter for antineoplastic radiation therapy: Secondary | ICD-10-CM | POA: Diagnosis not present

## 2018-12-26 NOTE — Progress Notes (Signed)
  Radiation Oncology         [phone] ________________________________  Name: Lauren [name] MRN: [id number]  Date: 12/26/2018  DOB: [birth year]   DIAGNOSIS:     ICD-10-CM   1. Malignant neoplasm of upper-outer quadrant of right breast in female, estrogen receptor positive ([place]) C50.411    Z17.0     SIMULATION AND TREATMENT PLANNING NOTE  The patient presented for simulation prior to beginning her course of radiation treatment for her diagnosis of right-sided breast cancer. The patient was placed in a supine position on a breast board. A customized vac-lock bag was constructed and this complex treatment device will be used on a daily basis during her treatment. In this fashion, a CT scan was obtained through the chest area and an isocenter was placed near the chest wall within the breast.  The patient will be planned to receive a course of radiation initially to a dose of 42.56 Gy. This will consist of a whole breast radiotherapy technique. To accomplish this, 2 customized blocks have been designed which will correspond to medial and lateral whole breast tangent fields. This treatment will be accomplished at 2.66 Gy per fraction. A forward planning technique will also be evaluated to determine if this approach improves the plan. It is anticipated that the patient will then receive a 8 Gy boost to the seroma cavity which has been contoured. This will be accomplished at 2 Gy per fraction.   This initial treatment will consist of a 3-D conformal technique. The seroma has been contoured as the primary target structure. Additionally, dose volume histograms of both this target as well as the lungs and heart will also be evaluated. Such an approach is necessary to ensure that the target area is adequately covered while the nearby critical  normal structures are adequately spared.  Plan:  The final anticipated total dose therefore will correspond to 50.56  Gy.    _______________________________   Jodelle Gross, MD, PhD

## 2018-12-26 NOTE — Progress Notes (Signed)
  Radiation Oncology         [phone] ________________________________  Name: Lauren [name] MRN: [id number]  Date: 12/26/2018  DOB: Apr 20, 1946  Optical Surface Tracking Plan:  Since intensity modulated radiotherapy (IMRT) and 3D conformal radiation treatment methods are predicated on accurate and precise positioning for treatment, intrafraction motion monitoring is medically necessary to ensure accurate and safe treatment delivery.  The ability to quantify intrafraction motion without excessive ionizing radiation dose can only be performed with optical surface tracking. Accordingly, surface imaging offers the opportunity to obtain 3D measurements of patient position throughout IMRT and 3D treatments without excessive radiation exposure.  I am ordering optical surface tracking for this patient's upcoming course of radiotherapy. ________________________________  Kyung Rudd, MD 12/26/2018 5:52 PM    Reference:   Particia Jasper, et al. Surface imaging-based analysis of intrafraction motion for breast radiotherapy patients.Journal of [place], n. 6, nov. 2014. ISSN 44315400.   Available at: <http://www.jacmp.org/index.php/jacmp/article/view/4957>.

## 2018-12-27 ENCOUNTER — Ambulatory Visit: Payer: PPO

## 2018-12-27 ENCOUNTER — Telehealth: Payer: Self-pay | Admitting: Oncology

## 2018-12-27 ENCOUNTER — Ambulatory Visit (INDEPENDENT_AMBULATORY_CARE_PROVIDER_SITE_OTHER): Payer: PPO | Admitting: Orthopaedic Surgery

## 2018-12-27 DIAGNOSIS — M79621 Pain in right upper arm: Secondary | ICD-10-CM

## 2018-12-27 DIAGNOSIS — Z483 Aftercare following surgery for neoplasm: Secondary | ICD-10-CM

## 2018-12-27 DIAGNOSIS — R600 Localized edema: Secondary | ICD-10-CM

## 2018-12-27 NOTE — Telephone Encounter (Signed)
Scheduled appt per 2/26 sch message - pt is aware of appt date and time   

## 2018-12-27 NOTE — Therapy (Signed)
[place], [place], [address] Phone: [phone]   Fax:  [phone]  Physical Therapy Treatment  Patient Details  Name: Lauren [name] MRN: [id number] Date of Birth: 1945/11/08 Referring Provider (PT): Shona Simpson, [place]   Encounter Date: 12/27/2018  PT End of Session - 12/27/18 1107    Visit Number  2    Number of Visits  8    Date for PT Re-Evaluation  01/18/19    PT Start Time  1026   pt running late   PT Stop Time  1109    PT Time Calculation (min)  43 min    Activity Tolerance  Patient tolerated treatment well    Behavior During Therapy  [facility] for tasks assessed/performed       Past Medical History:  Diagnosis Date  . Arthritis    Back, Hip- right  . Cancer ([place])    right breast  . Cataract   . Diabetes mellitus without complication ([place])    Type II  . Endometriosis   . Family history of adverse reaction to anesthesia    Father - N/V - blood pressure; daughter N/V  . Family history of breast cancer   . Family history of colon cancer   . Family history of kidney cancer   . Family history of prostate cancer   . GERD (gastroesophageal reflux disease)   . History of kidney stones   . History of ulcerative colitis   . Hypertension   . Insomnia   . 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 Neuropathy   . Pneumonia    1983, 2003  . PVCs (premature ventricular contractions)    benign  . Umbilical hernia   . Vitamin D deficiency   . Wears glasses     Past Surgical History:  Procedure Laterality Date  . APPENDECTOMY    . BREAST BIOPSY  08-13-2009  DR TSUEI   EXCISION LEFT NIPPLE DUCT  . BREAST EXCISIONAL BIOPSY Left    x2  . BREAST EXCISIONAL BIOPSY Right   . CHOLECYSTECTOMY  1992  . COLON RESECTION  1986   ULCERATIVE COLITIS  . COLON SURGERY    . ESOPHAGEAL MANOMETRY N/A 06/11/2013   Procedure: ESOPHAGEAL MANOMETRY (EM);  Surgeon: Jeryl Columbia, MD;  [facility]: [facility];   Service: Endoscopy;  Laterality: N/A;  . ESOPHAGOGASTRODUODENOSCOPY (EGD) WITH PROPOFOL N/A 05/31/2016   Procedure: ESOPHAGOGASTRODUODENOSCOPY (EGD) WITH PROPOFOL;  Surgeon: Clarene Essex, MD;  Location: [facility] END[facility]COPY;  Service: Endoscopy;  Laterality: N/A;  . EXCISION RIGHT BREAST MASS   10-20-2005  DR Marylene Buerger  . FRACTURE SURGERY     left arm  . kidney stone removed  2/11  . KNEE ARTHROSCOPY Left    MCL  . LEFT THUMB CARPOMETACARPAL JOINT SUSPENSIONPLASTY  04-06-2006  DR Burney Gauze  . LEFT WRIST ARTHROSCOPY W/ DEBRIDEMENT AND REMOVAL CYST  03-26-2004  DR Burney Gauze  . MYOMECTOMY    . RADIOACTIVE SEED GUIDED PARTIAL MASTECTOMY WITH AXILLARY SENTINEL LYMPH NODE BIOPSY Right 11/16/2018   Procedure: RIGHT BREAST RADIOACTIVE SEED GUIDED PARTIAL MASTECTOMY WITH  SENTINEL  NODE BIOPSY;  Surgeon: Coralie Keens, MD;  Location: [facility];  Service: General;  Laterality: Right;  . RIGHT COLECTOMY    . RIGHT URETEROSCOPIC STONE EXTRACTION  12-11-2009  DR Irine Seal  . TENDON RELEASE    . TONSILLECTOMY    . TOTAL HIP ARTHROPLASTY Right 11/26/2016   Procedure: RIGHT TOTAL HIP ARTHROPLASTY ANTERIOR APPROACH;  Surgeon: Mcarthur Rossetti, MD;  Location: [facility];  Service: Orthopedics;  Laterality: Right;  . TOTAL KNEE ARTHROPLASTY Left 12/02/2017   Procedure: LEFT TOTAL KNEE ARTHROPLASTY;  Surgeon: Mcarthur Rossetti, MD;  Location: [facility];  Service: Orthopedics;  Laterality: Left;  . WRIST SURGERY     both    There were no vitals filed for this visit.  Subjective Assessment - 12/27/18 1030    Subjective  My Rt breast swelling is already getting better, but the sensitivity is still terrible.     Pertinent History  Right lumpectomy and sentinel node biopsy (0/1 nodes positive) 11/16/2018. Left TKR 12/2017; right THR 11/2017. Colon resection 1986.    Patient Stated Goals  decrease right arm pain; decrease right breast edema    Currently in Pain?  Yes    Pain Score  6     Pain Location  Arm    Pain  Orientation  Right;Upper;Posterior    Pain Descriptors / Indicators  Aching   raw, like razor burn   Pain Type  Surgical pain;Neuropathic pain    Pain Onset  1 to 4 weeks ago    Pain Frequency  Intermittent    Aggravating Factors   anything touching the skin    Pain Relieving Factors  neurontin                       [facility] Adult PT Treatment/Exercise - 12/27/18 0001      Manual Therapy   Manual Therapy  Manual Lymphatic Drainage (MLD);Myofascial release;Passive ROM    Manual therapy comments  Issued med size thick stockinette for pt to wear to see if this helps hypersensitivity    Myofascial Release  To Rt axilla to tolerance during P/ROM, also UE pulling    Manual Lymphatic Drainage (MLD)  In Supine: Short neck, superficial and deep abdominals, Lt axillary and Rt inguinal nodes, anterior inter-axillary and Rt axillo-inguinal anastomosis then focused on Rt breast and upper arm (gently due to hypersensitivity but this improved by sessions end) redirecting fluid towards anastomosis.    Passive ROM  In Supine being mindful of hand placement throughout trying to avoid posterior upper arm, into flexion, abduction and D2.                   PT Long Term Goals - 12/21/18 1214      PT LONG TERM GOAL #1   Title  Pt will be independent with home exericse program.    Time  4    Period  Weeks    Status  New      PT LONG TERM GOAL #2   Title  Patient will improve DASH score to </= 20 for improved right arm function.    Baseline  47.73 at eval    Time  4    Period  Weeks    Status  New      PT LONG TERM GOAL #3   Title  Patient will report >/= 40% improvement in right upper arm hypersensitivity and pain.    Time  4    Period  Weeks    Status  New      PT LONG TERM GOAL #4   Title  Patient will verbalize understnading of risk reduction practices for right arm lymphedema.    Time  4    Period  Weeks    Status  New      PT LONG TERM GOAL #5  Title  Patient  will report >/= 25% improvement in right breast swelling.    Time  4    Period  Weeks    Status  New            Plan - 12/27/18 1221    Clinical Impression Statement  First session of manual lymph drainage and P/ROM to Rt UE. She was demonstrating tenderness at upper arm during MLD, but this did seem to improve by end of session as pt was able to tolerate therapist donning med size thick stockinette to her upper arm without increased sensitivity. Overall pt reported feeling better, not worse in upper arm at end of session.     Rehab Potential  Good    Clinical Impairments Affecting Rehab Potential  Radiation may conflict; schedule unknown at time of eval    PT Frequency  2x / week    PT Duration  4 weeks    PT Treatment/Interventions  ADLs/Self Care Home Management;Therapeutic activities;Therapeutic exercise;Patient/family education;Passive range of motion;Scar mobilization;Manual lymph drainage;Manual techniques    PT Next Visit Plan  Cont nerve desesitization for right posterior upper arm; manual lymph drainage may help upper arm pain and also MLD on breast; how was thick stocinette...look into compression sleeve if helpful??    Consulted and Agree with Plan of Care  Patient;Family member/caregiver    Family Member Consulted  Husband       Patient will benefit from skilled therapeutic intervention in order to improve the following deficits and impairments:  Increased fascial restricitons, Impaired UE functional use, Decreased knowledge of precautions, Pain, Increased edema, Impaired sensation, Postural dysfunction  Visit Diagnosis: Pain in right upper arm  Aftercare following surgery for neoplasm  Localized edema     Problem List Patient Active Problem List   Diagnosis Date Noted  . Genetic testing 12/07/2018  . Family history of breast cancer   . Family history of prostate cancer   . Family history of colon cancer   . Family history of kidney cancer   . Malignant  neoplasm of upper-outer quadrant of right breast in female, estrogen receptor positive ([place]) 11/09/2018  . Diabetic neuropathy, type II diabetes mellitus ([place]) 11/09/2018  . 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/02/2017  . Trochanteric bursitis, right hip 05/10/2017  . Primary osteoarthritis of left knee 03/09/2017  . Unilateral primary osteoarthritis, right hip 11/26/2016  . Status post total replacement of right hip 11/26/2016  . DDD (degenerative disc disease), 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  . Low back pain 04/23/2015  . Hiatal hernia 04/13/2013  . Type II or unspecified type diabetes mellitus without mention of complication, uncontrolled 01/26/2013  . Umbilical hernia 70/62/3762  . Hypertension     Otelia Limes, PTA 12/27/2018, 12:28 PM  [place], [place], [address] Phone: [phone]   Fax:  [phone]  Name: Lauren [name] MRN: [id number] Date of Birth: 06-04-1946

## 2018-12-28 ENCOUNTER — Ambulatory Visit: Payer: PPO

## 2018-12-28 DIAGNOSIS — M79621 Pain in right upper arm: Secondary | ICD-10-CM

## 2018-12-28 DIAGNOSIS — Z483 Aftercare following surgery for neoplasm: Secondary | ICD-10-CM

## 2018-12-28 DIAGNOSIS — C50411 Malignant neoplasm of upper-outer quadrant of right female breast: Secondary | ICD-10-CM | POA: Diagnosis not present

## 2018-12-28 DIAGNOSIS — Z510 Encounter for antineoplastic radiation therapy: Secondary | ICD-10-CM | POA: Diagnosis not present

## 2018-12-28 DIAGNOSIS — R600 Localized edema: Secondary | ICD-10-CM

## 2018-12-28 NOTE — Therapy (Signed)
[place], [place], [address] Phone: [phone]   Fax:  [phone]  Physical Therapy Treatment  Patient Details  Name: Lauren [name] MRN: [id number] Date of Birth: 01/27/1946 Referring Provider (PT): Shona Simpson, [place]   Encounter Date: 12/28/2018  PT End of Session - 12/28/18 0933    Visit Number  3    Number of Visits  8    Date for PT Re-Evaluation  01/18/19    PT Start Time  0850    PT Stop Time  0933    PT Time Calculation (min)  43 min    Activity Tolerance  Patient tolerated treatment well    Behavior During Therapy  [facility] for tasks assessed/performed       Past Medical History:  Diagnosis Date  . Arthritis    Back, Hip- right  . Cancer ([place])    right breast  . Cataract   . Diabetes mellitus without complication ([place])    Type II  . Endometriosis   . Family history of adverse reaction to anesthesia    Father - N/V - blood pressure; daughter N/V  . Family history of breast cancer   . Family history of colon cancer   . Family history of kidney cancer   . Family history of prostate cancer   . GERD (gastroesophageal reflux disease)   . History of kidney stones   . History of ulcerative colitis   . Hypertension   . Insomnia   . 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 Neuropathy   . Pneumonia    1983, 2003  . PVCs (premature ventricular contractions)    benign  . Umbilical hernia   . Vitamin D deficiency   . Wears glasses     Past Surgical History:  Procedure Laterality Date  . APPENDECTOMY    . BREAST BIOPSY  08-13-2009  DR TSUEI   EXCISION LEFT NIPPLE DUCT  . BREAST EXCISIONAL BIOPSY Left    x2  . BREAST EXCISIONAL BIOPSY Right   . CHOLECYSTECTOMY  1992  . COLON RESECTION  1986   ULCERATIVE COLITIS  . COLON SURGERY    . ESOPHAGEAL MANOMETRY N/A 06/11/2013   Procedure: ESOPHAGEAL MANOMETRY (EM);  Surgeon: Jeryl Columbia, MD;  [facility];  Service: Endoscopy;   Laterality: N/A;  . ESOPHAGOGASTRODUODENOSCOPY (EGD) WITH PROPOFOL N/A 05/31/2016   Procedure: ESOPHAGOGASTRODUODENOSCOPY (EGD) WITH PROPOFOL;  Surgeon: Clarene Essex, MD;  Location: [facility] ENDOSCOPY;  Service: Endoscopy;  Laterality: N/A;  . EXCISION RIGHT BREAST MASS   10-20-2005  DR Marylene Buerger  . FRACTURE SURGERY     left arm  . kidney stone removed  2/11  . KNEE ARTHROSCOPY Left    MCL  . LEFT THUMB CARPOMETACARPAL JOINT SUSPENSIONPLASTY  04-06-2006  DR Burney Gauze  . LEFT WRIST ARTHROSCOPY W/ DEBRIDEMENT AND REMOVAL CYST  03-26-2004  DR Burney Gauze  . MYOMECTOMY    . RADIOACTIVE SEED GUIDED PARTIAL MASTECTOMY WITH AXILLARY SENTINEL LYMPH NODE BIOPSY Right 11/16/2018   Procedure: RIGHT BREAST RADIOACTIVE SEED GUIDED PARTIAL MASTECTOMY WITH  SENTINEL  NODE BIOPSY;  Surgeon: Coralie Keens, MD;  Location: [facility];  Service: General;  Laterality: Right;  . RIGHT COLECTOMY    . RIGHT URETEROSCOPIC STONE EXTRACTION  12-11-2009  DR Irine Seal  . TENDON RELEASE    . TONSILLECTOMY    . TOTAL HIP ARTHROPLASTY Right 11/26/2016   Procedure: RIGHT TOTAL HIP ARTHROPLASTY ANTERIOR APPROACH;  Surgeon: Lind Guest  [name], MD;  [facility];  Service: Orthopedics;  Laterality: Right;  . TOTAL KNEE ARTHROPLASTY Left 12/02/2017   Procedure: LEFT TOTAL KNEE ARTHROPLASTY;  Surgeon: Mcarthur Rossetti, MD;  [facility];  Service: Orthopedics;  Laterality: Left;  . WRIST SURGERY     both    There were no vitals filed for this visit.  Subjective Assessment - 12/28/18 0855    Subjective  The sensitivity at my upper arm was some better after last session until I took the sleeve off. And the sleeve seemed to help for a while until it started to irritate my arm. the breast swelling is definitely some improved. I'm not hurting right now, just really uncomfortable.     Pertinent History  Right lumpectomy and sentinel node biopsy (0/1 nodes positive) 11/16/2018. Left TKR 12/2017; right THR 11/2017. Colon resection  1986.    Patient Stated Goals  decrease right arm pain; decrease right breast edema    Currently in Pain?  No/denies                       [facility] Adult PT Treatment/Exercise - 12/28/18 0001      Manual Therapy   Myofascial Release  To Rt axilla to tolerance during P/ROM, also UE pulling    Manual Lymphatic Drainage (MLD)  In Supine: Short neck, superficial and deep abdominals, Lt axillary and Rt inguinal nodes, anterior inter-axillary and Rt axillo-inguinal anastomosis then focused on Rt breast and upper arm (gently due to hypersensitivity but this improved by sessions end) redirecting fluid towards anastomosis.    Passive ROM  In Supine being mindful of hand placement throughout trying to avoid posterior upper arm, into flexion, abduction and D2.                   PT Long Term Goals - 12/21/18 1214      PT LONG TERM GOAL #1   Title  Pt will be independent with home exericse program.    Time  4    Period  Weeks    Status  New      PT LONG TERM GOAL #2   Title  Patient will improve DASH score to </= 20 for improved right arm function.    Baseline  47.73 at eval    Time  4    Period  Weeks    Status  New      PT LONG TERM GOAL #3   Title  Patient will report >/= 40% improvement in right upper arm hypersensitivity and pain.    Time  4    Period  Weeks    Status  New      PT LONG TERM GOAL #4   Title  Patient will verbalize understnading of risk reduction practices for right arm lymphedema.    Time  4    Period  Weeks    Status  New      PT LONG TERM GOAL #5   Title  Patient will report >/= 25% improvement in right breast swelling.    Time  4    Period  Weeks    Status  New            Plan - 12/28/18 7494    Clinical Impression Statement  Continued with manual therapy and pt seemed to tolerate this much better today at Rt upper arm. She reports thick stockinette was helpful but only for a few hours, so instructed  her to try to wear this a  little longer each day. She verbalized understanding this.     Rehab Potential  Good    Clinical Impairments Affecting Rehab Potential  Radiation may conflict; schedule unknown at time of eval    PT Frequency  2x / week    PT Duration  4 weeks    PT Treatment/Interventions  ADLs/Self Care Home Management;Therapeutic activities;Therapeutic exercise;Patient/family education;Passive range of motion;Scar mobilization;Manual lymph drainage;Manual techniques    PT Next Visit Plan  Cont nerve desesitization for right posterior upper arm; manual lymph drainage may help upper arm pain and also MLD on breast; how was thick stocinette...look into compression sleeve if helpful??    Consulted and Agree with Plan of Care  Patient;Family member/caregiver    Family Member Consulted  Husband       Patient will benefit from skilled therapeutic intervention in order to improve the following deficits and impairments:  Increased fascial restricitons, Impaired UE functional use, Decreased knowledge of precautions, Pain, Increased edema, Impaired sensation, Postural dysfunction  Visit Diagnosis: Pain in right upper arm  Aftercare following surgery for neoplasm  Localized edema     Problem List Patient Active Problem List   Diagnosis Date Noted  . Genetic testing 12/07/2018  . Family history of breast cancer   . Family history of prostate cancer   . Family history of colon cancer   . Family history of kidney cancer   . Malignant neoplasm of upper-outer quadrant of right breast in female, estrogen receptor positive ([place]) 11/09/2018  . Diabetic neuropathy, type II diabetes mellitus ([place]) 11/09/2018  . 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/02/2017  . Trochanteric bursitis, right hip 05/10/2017  . Primary osteoarthritis of left knee 03/09/2017  . Unilateral primary osteoarthritis, right hip 11/26/2016  . Status post total replacement of right hip 11/26/2016  . DDD (degenerative disc disease),  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  . Low back pain 04/23/2015  . Hiatal hernia 04/13/2013  . Type II or unspecified type diabetes mellitus without mention of complication, uncontrolled 01/26/2013  . Umbilical hernia 79/15/0569  . Hypertension     Otelia Limes, PTA 12/28/2018, 9:36 AM  [place], [place], [address] Phone: [phone]   Fax:  [phone]  Name: Lauren [name] MRN: [id number] Date of Birth: [birth year]

## 2019-01-02 ENCOUNTER — Ambulatory Visit: Payer: PPO | Attending: Radiation Oncology

## 2019-01-02 ENCOUNTER — Ambulatory Visit
Admission: RE | Admit: 2019-01-02 | Discharge: 2019-01-02 | Disposition: A | Discharge status: Home / Self Care | Payer: PPO | Source: Ambulatory Visit | Attending: Radiation Oncology | Admitting: Radiation Oncology

## 2019-01-02 DIAGNOSIS — M79621 Pain in right upper arm: Secondary | ICD-10-CM | POA: Diagnosis not present

## 2019-01-02 DIAGNOSIS — Z170 Estrogen receptor positive status [ER+]: Secondary | ICD-10-CM | POA: Insufficient documentation

## 2019-01-02 DIAGNOSIS — C50411 Malignant neoplasm of upper-outer quadrant of right female breast: Secondary | ICD-10-CM | POA: Diagnosis not present

## 2019-01-02 DIAGNOSIS — Z483 Aftercare following surgery for neoplasm: Secondary | ICD-10-CM | POA: Diagnosis not present

## 2019-01-02 DIAGNOSIS — R600 Localized edema: Secondary | ICD-10-CM

## 2019-01-02 DIAGNOSIS — Z510 Encounter for antineoplastic radiation therapy: Secondary | ICD-10-CM | POA: Diagnosis not present

## 2019-01-02 NOTE — Therapy (Addendum)
[place], [place], [address] Phone: [phone]   Fax:  [phone]  Physical Therapy Treatment  Patient Details  Name: Lauren [name] MRN: [id number] Date of Birth: 05-07-1946 Referring Provider (PT): Shona Simpson, [place]   Encounter Date: 01/02/2019  PT End of Session - 01/02/19 1020    Visit Number  4    Number of Visits  8    Date for PT Re-Evaluation  01/18/19    PT Start Time  0939   pt arrived late   PT Stop Time  1020    PT Time Calculation (min)  41 min    Activity Tolerance  Patient tolerated treatment well    Behavior During Therapy  [facility] for tasks assessed/performed       Past Medical History:  Diagnosis Date  . Arthritis    Back, Hip- right  . Cancer ([place])    right breast  . Cataract   . Diabetes mellitus without complication ([place])    Type II  . Endometriosis   . Family history of adverse reaction to anesthesia    Father - N/V - blood pressure; daughter N/V  . Family history of breast cancer   . Family history of colon cancer   . Family history of kidney cancer   . Family history of prostate cancer   . GERD (gastroesophageal reflux disease)   . History of kidney stones   . History of ulcerative colitis   . Hypertension   . Insomnia   . 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 Neuropathy   . Pneumonia    1983, 2003  . PVCs (premature ventricular contractions)    benign  . Umbilical hernia   . Vitamin D deficiency   . Wears glasses     Past Surgical History:  Procedure Laterality Date  . APPENDECTOMY    . BREAST BIOPSY  08-13-2009  DR TSUEI   EXCISION LEFT NIPPLE DUCT  . BREAST EXCISIONAL BIOPSY Left    x2  . BREAST EXCISIONAL BIOPSY Right   . CHOLECYSTECTOMY  1992  . COLON RESECTION  1986   ULCERATIVE COLITIS  . COLON SURGERY    . ESOPHAGEAL MANOMETRY N/A 06/11/2013   Procedure: ESOPHAGEAL MANOMETRY (EM);  Surgeon: Jeryl Columbia, MD;  Location: [facility];   Service: Endoscopy;  Laterality: N/A;  . ESOPHAGOGASTRODUODENOSCOPY (EGD) WITH PROPOFOL N/A 05/31/2016   Procedure: ESOPHAGOGASTRODUODENOSCOPY (EGD) WITH PROPOFOL;  Surgeon: Clarene Essex, MD;  Location: [facility] ENDOSCOPY;  Service: Endoscopy;  Laterality: N/A;  . EXCISION RIGHT BREAST MASS   10-20-2005  DR Marylene Buerger  . FRACTURE SURGERY     left arm  . kidney stone removed  2/11  . KNEE ARTHROSCOPY Left    MCL  . LEFT THUMB CARPOMETACARPAL JOINT SUSPENSIONPLASTY  04-06-2006  DR Burney Gauze  . LEFT WRIST ARTHROSCOPY W/ DEBRIDEMENT AND REMOVAL CYST  03-26-2004  DR Burney Gauze  . MYOMECTOMY    . RADIOACTIVE SEED GUIDED PARTIAL MASTECTOMY WITH AXILLARY SENTINEL LYMPH NODE BIOPSY Right 11/16/2018   Procedure: RIGHT BREAST RADIOACTIVE SEED GUIDED PARTIAL MASTECTOMY WITH  SENTINEL  NODE BIOPSY;  Surgeon: Coralie Keens, MD;  Location: [facility];  Service: General;  Laterality: Right;  . RIGHT COLECTOMY    . RIGHT URETEROSCOPIC STONE EXTRACTION  12-11-2009  DR Irine Seal  . TENDON RELEASE    . TONSILLECTOMY    . TOTAL HIP ARTHROPLASTY Right 11/26/2016   Procedure: RIGHT TOTAL HIP ARTHROPLASTY ANTERIOR APPROACH;  Surgeon: Mcarthur Rossetti, MD;  Location: [facility];  Service: Orthopedics;  Laterality: Right;  . TOTAL KNEE ARTHROPLASTY Left 12/02/2017   Procedure: LEFT TOTAL KNEE ARTHROPLASTY;  Surgeon: Mcarthur Rossetti, MD;  Location: [facility];  Service: Orthopedics;  Laterality: Left;  . WRIST SURGERY     both    There were no vitals filed for this visit.  Subjective Assessment - 01/02/19 0943    Subjective  The sensitivity is actually just a tiny bit better. I had to stop taking the gabapentin during the day bc I was just too tired. I can still tell a slight improvement in my sensitivity at my Rt upper arm after being able to sleep on that side. I start radiation today.    Pertinent History  Right lumpectomy and sentinel node biopsy (0/1 nodes positive) 11/16/2018. Left TKR 12/2017; right THR 11/2017. Colon  resection 1986.    Patient Stated Goals  decrease right arm pain; decrease right breast edema    Currently in Pain?  Yes    Pain Score  4     Pain Location  Arm    Pain Orientation  Right    Pain Descriptors / Indicators  Other (Comment)   razor burn   Pain Type  Surgical pain;Neuropathic pain    Pain Onset  1 to 4 weeks ago    Pain Frequency  Intermittent    Aggravating Factors   anything touching the skin     Pain Relieving Factors  gabapentin                       [facility] Adult PT Treatment/Exercise - 01/02/19 0001      Manual Therapy   Manual Therapy  Soft tissue mobilization    Soft tissue mobilization  Gently to upper arm up to point where pt could tolerate but not past there as her arm becomes easily hypersensitive    Myofascial Release  To Rt axilla to tolerance during P/ROM, also UE pulling    Manual Lymphatic Drainage (MLD)  In Supine: Short neck, superficial and deep abdominals, Lt axillary and Rt inguinal nodes, anterior inter-axillary and Rt axillo-inguinal anastomosis then focused on Rt breast and upper arm (gently due to hypersensitivity but this improved by sessions end) redirecting fluid towards anastomosis.    Passive ROM  In Supine being mindful of hand placement throughout trying to avoid posterior upper arm, into flexion, abduction and D2.                   PT Long Term Goals - 12/21/18 1214      PT LONG TERM GOAL #1   Title  Pt will be independent with home exericse program.    Time  4    Period  Weeks    Status  New      PT LONG TERM GOAL #2   Title  Patient will improve DASH score to </= 20 for improved right arm function.    Baseline  47.73 at eval    Time  4    Period  Weeks    Status  New      PT LONG TERM GOAL #3   Title  Patient will report >/= 40% improvement in right upper arm hypersensitivity and pain.    Time  4    Period  Weeks    Status  New      PT LONG TERM GOAL #4   Title  Patient  will verbalize  understnading of risk reduction practices for right arm lymphedema.    Time  4    Period  Weeks    Status  New      PT LONG TERM GOAL #5   Title  Patient will report >/= 25% improvement in right breast swelling.    Time  4    Period  Weeks    Status  New            Plan - 01/02/19 1021    Clinical Impression Statement  Continued with manual therapy focusing on pts end ROM, and desensitization technique, today including soft tissue mobs to upper arm up to pts tolerance level. She did very well with this and reports noticing improvement overall since first session, though hypersensitivity is still present.    Rehab Potential  Good    Clinical Impairments Affecting Rehab Potential  Radiation may conflict; schedule unknown at time of eval    PT Frequency  2x / week    PT Duration  4 weeks    PT Treatment/Interventions  ADLs/Self Care Home Management;Therapeutic activities;Therapeutic exercise;Patient/family education;Passive range of motion;Scar mobilization;Manual lymph drainage;Manual techniques    PT Next Visit Plan  Cont nerve desesitization for right posterior upper arm; manual lymph drainage may help upper arm pain and also MLD on breast; how was thick stocinette...look into compression sleeve if helpful??    Consulted and Agree with Plan of Care  Patient;Family member/caregiver    Family Member Consulted  Husband       Patient will benefit from skilled therapeutic intervention in order to improve the following deficits and impairments:  Increased fascial restricitons, Impaired UE functional use, Decreased knowledge of precautions, Pain, Increased edema, Impaired sensation, Postural dysfunction  Visit Diagnosis: Pain in right upper arm  Aftercare following surgery for neoplasm  Localized edema     Problem List Patient Active Problem List   Diagnosis Date Noted  . Genetic testing 12/07/2018  . Family history of breast cancer   . Family history of prostate cancer   .  Family history of colon cancer   . Family history of kidney cancer   . Malignant neoplasm of upper-outer quadrant of right breast in female, estrogen receptor positive ([place]) 11/09/2018  . Diabetic neuropathy, type II diabetes mellitus ([place]) 11/09/2018  . 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/02/2017  . Trochanteric bursitis, right hip 05/10/2017  . Primary osteoarthritis of left knee 03/09/2017  . Unilateral primary osteoarthritis, right hip 11/26/2016  . Status post total replacement of right hip 11/26/2016  . DDD (degenerative disc disease), 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  . Low back pain 04/23/2015  . Hiatal hernia 04/13/2013  . Type II or unspecified type diabetes mellitus without mention of complication, uncontrolled 01/26/2013  . Umbilical hernia 44/96/7591  . Hypertension     Otelia Limes, PTA 01/02/2019, 10:23 AM  [facility] [place], [place], [address] Phone: [phone]   Fax:  [phone]  Name: Lauren [name] MRN: [id number] Date of Birth: 1946-02-09  PHYSICAL THERAPY DISCHARGE SUMMARY  Visits from Start of Care: 4  Current functional level related to goals / functional outcomes: Unknown. Patient has not been seen since 12/2018. No goals were met when she was last seen.   Remaining deficits: Unknown.   Education / Equipment: HEP  Plan: Patient agrees to discharge.  Patient goals were not met. Patient is being discharged due to not returning since the last visit.  ?????  Annia Friendly, [place] 07/03/19 12:22 PM

## 2019-01-03 ENCOUNTER — Ambulatory Visit (INDEPENDENT_AMBULATORY_CARE_PROVIDER_SITE_OTHER): Payer: PPO

## 2019-01-03 ENCOUNTER — Encounter (INDEPENDENT_AMBULATORY_CARE_PROVIDER_SITE_OTHER): Payer: Self-pay | Admitting: Orthopaedic Surgery

## 2019-01-03 ENCOUNTER — Ambulatory Visit
Admission: RE | Admit: 2019-01-03 | Discharge: 2019-01-03 | Disposition: A | Discharge status: Home / Self Care | Payer: PPO | Source: Ambulatory Visit | Attending: Radiation Oncology | Admitting: Radiation Oncology

## 2019-01-03 ENCOUNTER — Ambulatory Visit (INDEPENDENT_AMBULATORY_CARE_PROVIDER_SITE_OTHER): Payer: PPO | Admitting: Orthopaedic Surgery

## 2019-01-03 DIAGNOSIS — C50411 Malignant neoplasm of upper-outer quadrant of right female breast: Secondary | ICD-10-CM | POA: Diagnosis not present

## 2019-01-03 DIAGNOSIS — Z96652 Presence of left artificial knee joint: Secondary | ICD-10-CM

## 2019-01-03 DIAGNOSIS — M25561 Pain in right knee: Secondary | ICD-10-CM

## 2019-01-03 DIAGNOSIS — Z510 Encounter for antineoplastic radiation therapy: Secondary | ICD-10-CM | POA: Diagnosis not present

## 2019-01-03 DIAGNOSIS — G894 Chronic pain syndrome: Secondary | ICD-10-CM | POA: Diagnosis not present

## 2019-01-03 DIAGNOSIS — Z79891 Long term (current) use of opiate analgesic: Secondary | ICD-10-CM | POA: Diagnosis not present

## 2019-01-03 DIAGNOSIS — Z79899 Other long term (current) drug therapy: Secondary | ICD-10-CM | POA: Diagnosis not present

## 2019-01-03 DIAGNOSIS — M47816 Spondylosis without myelopathy or radiculopathy, lumbar region: Secondary | ICD-10-CM | POA: Diagnosis not present

## 2019-01-03 DIAGNOSIS — M150 Primary generalized (osteo)arthritis: Secondary | ICD-10-CM | POA: Diagnosis not present

## 2019-01-03 DIAGNOSIS — M1711 Unilateral primary osteoarthritis, right knee: Secondary | ICD-10-CM | POA: Diagnosis not present

## 2019-01-03 MED ORDER — LIDOCAINE HCL 1 % IJ SOLN
3.0000 mL | INTRAMUSCULAR | Status: AC | PRN
  Administered 2019-01-03: 3 mL

## 2019-01-03 MED ORDER — METHYLPREDNISOLONE ACETATE 40 MG/ML IJ SUSP
40.0000 mg | INTRAMUSCULAR | Status: AC | PRN
  Administered 2019-01-03: 40 mg via INTRA_ARTICULAR

## 2019-01-03 NOTE — Progress Notes (Signed)
Office Visit Note   Patient: Lauren [name]           Date of Birth: 12-05-1945           MRN: [id number] Visit Date: 01/03/2019              Requested by: Lawerance Cruel, [place], [place] [address] PCP: Lawerance Cruel, MD   Assessment & Plan: Visit Diagnoses:  1. Right knee pain, unspecified chronicity   2. Status post total left knee replacement   3. Unilateral primary osteoarthritis, right knee     Plan: She does have mild to moderate arthritis in her right knee but I do feel that she can avoid knee replacement surgery given the good function she still has in that knee.  She is appropriate candidate for steroid injection in her right knee today and then a hyaluronic acid injection in the right knee in a month now.  All question concerns were answered addressed.  She tolerated steroid injection well today.  We will see her back in a month to place hyaluronic acid into the right knee.  Follow-Up Instructions: Return in about 4 weeks (around 01/31/2019).   Orders:  Orders Placed This Encounter  Procedures  . XR Knee 1-2 Views Right   No orders of the defined types were placed in this encounter.     Procedures: Large Joint Inj: R knee on 01/03/2019 3:36 PM Indications: diagnostic evaluation and pain Details: 22 G 1.5 in needle, superolateral approach  Arthrogram: No  Medications: 3 mL lidocaine 1 %; 40 mg methylPREDNISolone acetate 40 MG/ML Outcome: tolerated well, no immediate complications Procedure, treatment alternatives, risks and benefits explained, specific risks discussed. Consent was given by the patient. Immediately prior to procedure a time out was called to verify the correct patient, procedure, equipment, support staff and site/side marked as required. Patient was prepped and draped in the usual sterile fashion.       Clinical Data: No additional findings.   Subjective: Chief Complaint  Patient presents with  . Right Knee  - Pain  The patient is very well-known to me.  She is about 13 months out from a left total knee arthroplasty.  She says the knee is doing well but still has some aches and pains.  Her right knee is been bothering her recently.  She is also started radiation treatment for breast cancer.  She had recent breast surgery by my brother Dr. [name].  Her right knee is not locking catching but it does has a deep aching pain that occurs mainly with activities.  HPI  Review of Systems She currently denies any headache, chest pain, shortness of breath, fever, chills, nausea, vomiting  Objective: Vital Signs: There were no vitals taken for this visit.  Physical Exam Is alert and oriented x3 and in no acute distress Ortho Exam Examination of her right knee shows medial joint line tenderness with good range of motion.  The knee feels ligamentously stable.  She has patellofemoral crepitation on exam. Specialty Comments:  No specialty comments available.  Imaging: Xr Knee 1-2 Views Right  Result Date: 01/03/2019 2 views of the right knee show mild to moderate arthritic changes with slight medial joint space narrowing.  There is patellofemoral arthritic changes.  There are peritracheal osteophytes in all 3 compartments.    PMFS History: Patient Active Problem List   Diagnosis Date Noted  . Unilateral primary osteoarthritis, right knee 01/03/2019  . Genetic testing  12/07/2018  . Family history of breast cancer   . Family history of prostate cancer   . Family history of colon cancer   . Family history of kidney cancer   . Malignant neoplasm of upper-outer quadrant of right breast in female, estrogen receptor positive ([place]) 11/09/2018  . Diabetic neuropathy, type II diabetes mellitus ([place]) 11/09/2018  . 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/02/2017  . Trochanteric bursitis, right hip 05/10/2017  . Primary osteoarthritis of left knee 03/09/2017  . Unilateral primary osteoarthritis, right hip  11/26/2016  . Status post total replacement of right hip 11/26/2016  . DDD (degenerative disc disease), 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  . Low back pain 04/23/2015  . Hiatal hernia 04/13/2013  . Type II or unspecified type diabetes mellitus without mention of complication, uncontrolled 01/26/2013  . Umbilical hernia 15/40/0867  . Hypertension    Past Medical History:  Diagnosis Date  . Arthritis    Back, Hip- right  . Cancer ([place])    right breast  . Cataract   . Diabetes mellitus without complication ([place])    Type II  . Endometriosis   . Family history of adverse reaction to anesthesia    Father - N/V - blood pressure; daughter N/V  . Family history of breast cancer   . Family history of colon cancer   . Family history of kidney cancer   . Family history of prostate cancer   . GERD (gastroesophageal reflux disease)   . History of kidney stones   . History of ulcerative colitis   . Hypertension   . Insomnia   . 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 Neuropathy   . Pneumonia    1983, 2003  . PVCs (premature ventricular contractions)    benign  . Umbilical hernia   . Vitamin D deficiency   . Wears glasses     Family History  Problem Relation Age of Onset  . Hypertension Father   . Heart disease Father   . Diabetes Father   . Cancer Father        colon, prostate, and kidney  . Hypertension Mother   . Parkinson's disease Mother   . Heart disease Maternal Grandmother   . Diabetes Maternal Grandmother   . Heart disease Maternal Grandfather   . Diabetes Maternal Grandfather   . Cancer Maternal Grandfather        pt unaware of what kind  . Stroke Paternal Grandmother   . Stroke Paternal Grandfather   . Colon cancer Paternal Aunt        dx less than 54  . Cancer Maternal Aunt        cervical vs ovarian  . Prostate cancer Paternal Uncle   . Prostate cancer Other        PGF's father  . Breast cancer Paternal Aunt        dx in her 38s  . Breast cancer Cousin        pat first  cousin, dx over 53    Past Surgical History:  Procedure Laterality Date  . APPENDECTOMY    . BREAST BIOPSY  08-13-2009  DR TSUEI   EXCISION LEFT NIPPLE DUCT  . BREAST EXCISIONAL BIOPSY Left    x2  . BREAST EXCISIONAL BIOPSY Right   . CHOLECYSTECTOMY  1992  . COLON RESECTION  1986   ULCERATIVE COLITIS  . COLON SURGERY    . ESOPHAGEAL MANOMETRY N/A 06/11/2013   Procedure: ESOPHAGEAL MANOMETRY (EM);  Surgeon:  Jeryl Columbia, MD;  Location: [facility];  Service: Endoscopy;  Laterality: N/A;  . ESOPHAGOGASTRODUODENOSCOPY (EGD) WITH PROPOFOL N/A 05/31/2016   Procedure: ESOPHAGOGASTRODUODENOSCOPY (EGD) WITH PROPOFOL;  Surgeon: Clarene Essex, MD;  Location: [facility] ENDOSCOPY;  Service: Endoscopy;  Laterality: N/A;  . EXCISION RIGHT BREAST MASS   10-20-2005  DR Marylene Buerger  . FRACTURE SURGERY     left arm  . kidney stone removed  2/11  . KNEE ARTHROSCOPY Left    MCL  . LEFT THUMB CARPOMETACARPAL JOINT SUSPENSIONPLASTY  04-06-2006  DR Burney Gauze  . LEFT WRIST ARTHROSCOPY W/ DEBRIDEMENT AND REMOVAL CYST  03-26-2004  DR Burney Gauze  . MYOMECTOMY    . RADIOACTIVE SEED GUIDED PARTIAL MASTECTOMY WITH AXILLARY SENTINEL LYMPH NODE BIOPSY Right 11/16/2018   Procedure: RIGHT BREAST RADIOACTIVE SEED GUIDED PARTIAL MASTECTOMY WITH  SENTINEL  NODE BIOPSY;  Surgeon: Coralie Keens, MD;  Location: [facility];  Service: General;  Laterality: Right;  . RIGHT COLECTOMY    . RIGHT URETEROSCOPIC STONE EXTRACTION  12-11-2009  DR Irine Seal  . TENDON RELEASE    . TONSILLECTOMY    . TOTAL HIP ARTHROPLASTY Right 11/26/2016   Procedure: RIGHT TOTAL HIP ARTHROPLASTY ANTERIOR APPROACH;  Surgeon: Mcarthur Rossetti, MD;  [facility]: [facility];  Service: Orthopedics;  Laterality: Right;  . TOTAL KNEE ARTHROPLASTY Left 12/02/2017   Procedure: LEFT TOTAL KNEE ARTHROPLASTY;  Surgeon: Mcarthur Rossetti, MD;  Location: [facility];  Service: Orthopedics;  Laterality: Left;  . WRIST SURGERY     both   Social History   Occupational History   . Not on file  Tobacco Use  . Smoking status: Former Smoker    Packs/day: 1.00    Years: 10.00    Pack years: 10.00    Types: Cigarettes    Last attempt to quit: 09/22/1982    Years since quitting: 36.3  . Smokeless tobacco: Never Used  . Tobacco comment: quit 1983  Substance and Sexual Activity  . Alcohol use: No  . Drug use: No  . Sexual activity: Yes    Partners: Male    Birth control/protection: Post-menopausal

## 2019-01-04 ENCOUNTER — Ambulatory Visit
Admission: RE | Admit: 2019-01-04 | Discharge: 2019-01-04 | Disposition: A | Discharge status: Home / Self Care | Payer: PPO | Source: Ambulatory Visit | Attending: Radiation Oncology | Admitting: Radiation Oncology

## 2019-01-04 ENCOUNTER — Ambulatory Visit: Payer: PPO

## 2019-01-04 DIAGNOSIS — Z510 Encounter for antineoplastic radiation therapy: Secondary | ICD-10-CM | POA: Diagnosis not present

## 2019-01-04 DIAGNOSIS — C50411 Malignant neoplasm of upper-outer quadrant of right female breast: Secondary | ICD-10-CM | POA: Diagnosis not present

## 2019-01-05 ENCOUNTER — Ambulatory Visit
Admission: RE | Admit: 2019-01-05 | Discharge: 2019-01-05 | Disposition: A | Discharge status: Home / Self Care | Payer: PPO | Source: Ambulatory Visit | Attending: Radiation Oncology | Admitting: Radiation Oncology

## 2019-01-05 ENCOUNTER — Telehealth (INDEPENDENT_AMBULATORY_CARE_PROVIDER_SITE_OTHER): Payer: Self-pay

## 2019-01-05 DIAGNOSIS — C50411 Malignant neoplasm of upper-outer quadrant of right female breast: Secondary | ICD-10-CM

## 2019-01-05 DIAGNOSIS — Z510 Encounter for antineoplastic radiation therapy: Secondary | ICD-10-CM | POA: Diagnosis not present

## 2019-01-05 DIAGNOSIS — Z170 Estrogen receptor positive status [ER+]: Secondary | ICD-10-CM

## 2019-01-05 MED ORDER — ALRA NON-METALLIC DEODORANT (RAD-ONC)
1.0000 "application " | Freq: Once | TOPICAL | Status: AC
  Administered 2019-01-05: 1 via TOPICAL

## 2019-01-05 MED ORDER — RADIAPLEXRX EX GEL
Freq: Once | CUTANEOUS | Status: AC
  Administered 2019-01-05: 15:00:00 via TOPICAL

## 2019-01-05 NOTE — Progress Notes (Signed)
Pt here for patient teaching.  Pt given Radiation and You booklet, skin care instructions, Alra deodorant and Radiaplex gel.  Reviewed areas of pertinence such as fatigue, hair loss, skin changes, breast tenderness and breast swelling . Pt able to give teach back of to pat skin and use unscented/gentle soap,apply Radiaplex bid, avoid applying anything to skin within 4 hours of treatment, avoid wearing an under wire bra and to use an electric razor if they must shave. Pt verbalizes understanding of information given and will contact nursing with any questions or concerns.     Jamen Loiseau M. Tykesha Konicki RN, BSN      

## 2019-01-05 NOTE — Telephone Encounter (Signed)
Right knee gel injection  

## 2019-01-08 ENCOUNTER — Ambulatory Visit
Admission: RE | Admit: 2019-01-08 | Discharge: 2019-01-08 | Disposition: A | Discharge status: Home / Self Care | Payer: PPO | Source: Ambulatory Visit | Attending: Radiation Oncology | Admitting: Radiation Oncology

## 2019-01-08 DIAGNOSIS — Z510 Encounter for antineoplastic radiation therapy: Secondary | ICD-10-CM | POA: Diagnosis not present

## 2019-01-08 DIAGNOSIS — C50411 Malignant neoplasm of upper-outer quadrant of right female breast: Secondary | ICD-10-CM | POA: Diagnosis not present

## 2019-01-09 ENCOUNTER — Ambulatory Visit
Admission: RE | Admit: 2019-01-09 | Discharge: 2019-01-09 | Disposition: A | Discharge status: Home / Self Care | Payer: PPO | Source: Ambulatory Visit | Attending: Radiation Oncology | Admitting: Radiation Oncology

## 2019-01-09 DIAGNOSIS — Z510 Encounter for antineoplastic radiation therapy: Secondary | ICD-10-CM | POA: Diagnosis not present

## 2019-01-09 DIAGNOSIS — C50411 Malignant neoplasm of upper-outer quadrant of right female breast: Secondary | ICD-10-CM | POA: Diagnosis not present

## 2019-01-10 ENCOUNTER — Other Ambulatory Visit: Payer: Self-pay

## 2019-01-10 ENCOUNTER — Ambulatory Visit
Admission: RE | Admit: 2019-01-10 | Discharge: 2019-01-10 | Disposition: A | Discharge status: Home / Self Care | Payer: PPO | Source: Ambulatory Visit | Attending: Radiation Oncology | Admitting: Radiation Oncology

## 2019-01-10 DIAGNOSIS — C50411 Malignant neoplasm of upper-outer quadrant of right female breast: Secondary | ICD-10-CM | POA: Diagnosis not present

## 2019-01-10 DIAGNOSIS — Z510 Encounter for antineoplastic radiation therapy: Secondary | ICD-10-CM | POA: Diagnosis not present

## 2019-01-10 NOTE — Telephone Encounter (Signed)
Patient has an appt on 02/06/19 with [name].  Ok to do [employer].  Insurance is [place], covered and no auth needed.

## 2019-01-11 ENCOUNTER — Ambulatory Visit
Admission: RE | Admit: 2019-01-11 | Discharge: 2019-01-11 | Disposition: A | Discharge status: Home / Self Care | Payer: PPO | Source: Ambulatory Visit | Attending: Radiation Oncology | Admitting: Radiation Oncology

## 2019-01-11 ENCOUNTER — Other Ambulatory Visit: Payer: Self-pay

## 2019-01-11 DIAGNOSIS — C50411 Malignant neoplasm of upper-outer quadrant of right female breast: Secondary | ICD-10-CM | POA: Diagnosis not present

## 2019-01-11 DIAGNOSIS — Z510 Encounter for antineoplastic radiation therapy: Secondary | ICD-10-CM | POA: Diagnosis not present

## 2019-01-12 ENCOUNTER — Other Ambulatory Visit: Payer: Self-pay

## 2019-01-12 ENCOUNTER — Ambulatory Visit
Admission: RE | Admit: 2019-01-12 | Discharge: 2019-01-12 | Disposition: A | Discharge status: Home / Self Care | Payer: PPO | Source: Ambulatory Visit | Attending: Radiation Oncology | Admitting: Radiation Oncology

## 2019-01-12 DIAGNOSIS — C50411 Malignant neoplasm of upper-outer quadrant of right female breast: Secondary | ICD-10-CM | POA: Diagnosis not present

## 2019-01-12 DIAGNOSIS — Z510 Encounter for antineoplastic radiation therapy: Secondary | ICD-10-CM | POA: Diagnosis not present

## 2019-01-15 ENCOUNTER — Ambulatory Visit
Admission: RE | Admit: 2019-01-15 | Discharge: 2019-01-15 | Disposition: A | Discharge status: Home / Self Care | Payer: PPO | Source: Ambulatory Visit | Attending: Radiation Oncology | Admitting: Radiation Oncology

## 2019-01-15 DIAGNOSIS — Z510 Encounter for antineoplastic radiation therapy: Secondary | ICD-10-CM | POA: Diagnosis not present

## 2019-01-15 DIAGNOSIS — C50411 Malignant neoplasm of upper-outer quadrant of right female breast: Secondary | ICD-10-CM | POA: Diagnosis not present

## 2019-01-16 ENCOUNTER — Ambulatory Visit
Admission: RE | Admit: 2019-01-16 | Discharge: 2019-01-16 | Disposition: A | Discharge status: Home / Self Care | Payer: PPO | Source: Ambulatory Visit | Attending: Radiation Oncology | Admitting: Radiation Oncology

## 2019-01-16 ENCOUNTER — Other Ambulatory Visit: Payer: Self-pay

## 2019-01-16 DIAGNOSIS — Z510 Encounter for antineoplastic radiation therapy: Secondary | ICD-10-CM | POA: Diagnosis not present

## 2019-01-16 DIAGNOSIS — C50411 Malignant neoplasm of upper-outer quadrant of right female breast: Secondary | ICD-10-CM | POA: Diagnosis not present

## 2019-01-17 ENCOUNTER — Other Ambulatory Visit: Payer: Self-pay

## 2019-01-17 ENCOUNTER — Ambulatory Visit
Admission: RE | Admit: 2019-01-17 | Discharge: 2019-01-17 | Disposition: A | Discharge status: Home / Self Care | Payer: PPO | Source: Ambulatory Visit | Attending: Radiation Oncology | Admitting: Radiation Oncology

## 2019-01-17 DIAGNOSIS — C50411 Malignant neoplasm of upper-outer quadrant of right female breast: Secondary | ICD-10-CM | POA: Diagnosis not present

## 2019-01-17 DIAGNOSIS — Z510 Encounter for antineoplastic radiation therapy: Secondary | ICD-10-CM | POA: Diagnosis not present

## 2019-01-18 ENCOUNTER — Ambulatory Visit
Admission: RE | Admit: 2019-01-18 | Discharge: 2019-01-18 | Disposition: A | Discharge status: Home / Self Care | Payer: PPO | Source: Ambulatory Visit | Attending: Radiation Oncology | Admitting: Radiation Oncology

## 2019-01-18 ENCOUNTER — Other Ambulatory Visit: Payer: Self-pay

## 2019-01-18 DIAGNOSIS — Z510 Encounter for antineoplastic radiation therapy: Secondary | ICD-10-CM | POA: Diagnosis not present

## 2019-01-18 DIAGNOSIS — C50411 Malignant neoplasm of upper-outer quadrant of right female breast: Secondary | ICD-10-CM | POA: Diagnosis not present

## 2019-01-19 ENCOUNTER — Ambulatory Visit
Admission: RE | Admit: 2019-01-19 | Discharge: 2019-01-19 | Disposition: A | Discharge status: Home / Self Care | Payer: PPO | Source: Ambulatory Visit | Attending: Radiation Oncology | Admitting: Radiation Oncology

## 2019-01-19 DIAGNOSIS — C50411 Malignant neoplasm of upper-outer quadrant of right female breast: Secondary | ICD-10-CM | POA: Diagnosis not present

## 2019-01-19 DIAGNOSIS — Z510 Encounter for antineoplastic radiation therapy: Secondary | ICD-10-CM | POA: Diagnosis not present

## 2019-01-22 ENCOUNTER — Other Ambulatory Visit: Payer: Self-pay

## 2019-01-22 ENCOUNTER — Ambulatory Visit
Admission: RE | Admit: 2019-01-22 | Discharge: 2019-01-22 | Disposition: A | Discharge status: Home / Self Care | Payer: PPO | Source: Ambulatory Visit | Attending: Radiation Oncology | Admitting: Radiation Oncology

## 2019-01-22 DIAGNOSIS — I10 Essential (primary) hypertension: Secondary | ICD-10-CM | POA: Diagnosis not present

## 2019-01-22 DIAGNOSIS — E1165 Type 2 diabetes mellitus with hyperglycemia: Secondary | ICD-10-CM | POA: Diagnosis not present

## 2019-01-22 DIAGNOSIS — Z510 Encounter for antineoplastic radiation therapy: Secondary | ICD-10-CM | POA: Diagnosis not present

## 2019-01-22 DIAGNOSIS — E1169 Type 2 diabetes mellitus with other specified complication: Secondary | ICD-10-CM | POA: Diagnosis not present

## 2019-01-22 DIAGNOSIS — C50411 Malignant neoplasm of upper-outer quadrant of right female breast: Secondary | ICD-10-CM | POA: Diagnosis not present

## 2019-01-22 DIAGNOSIS — C50919 Malignant neoplasm of unspecified site of unspecified female breast: Secondary | ICD-10-CM | POA: Diagnosis not present

## 2019-01-23 ENCOUNTER — Other Ambulatory Visit: Payer: Self-pay

## 2019-01-23 ENCOUNTER — Ambulatory Visit
Admission: RE | Admit: 2019-01-23 | Discharge: 2019-01-23 | Disposition: A | Discharge status: Home / Self Care | Payer: PPO | Source: Ambulatory Visit | Attending: Radiation Oncology | Admitting: Radiation Oncology

## 2019-01-23 ENCOUNTER — Ambulatory Visit: Payer: PPO | Admitting: Radiation Oncology

## 2019-01-23 DIAGNOSIS — C50411 Malignant neoplasm of upper-outer quadrant of right female breast: Secondary | ICD-10-CM | POA: Diagnosis not present

## 2019-01-23 DIAGNOSIS — Z510 Encounter for antineoplastic radiation therapy: Secondary | ICD-10-CM | POA: Diagnosis not present

## 2019-01-24 ENCOUNTER — Ambulatory Visit
Admission: RE | Admit: 2019-01-24 | Discharge: 2019-01-24 | Disposition: A | Discharge status: Home / Self Care | Payer: PPO | Source: Ambulatory Visit | Attending: Radiation Oncology | Admitting: Radiation Oncology

## 2019-01-24 ENCOUNTER — Other Ambulatory Visit: Payer: Self-pay

## 2019-01-24 DIAGNOSIS — C50411 Malignant neoplasm of upper-outer quadrant of right female breast: Secondary | ICD-10-CM | POA: Diagnosis not present

## 2019-01-24 DIAGNOSIS — Z510 Encounter for antineoplastic radiation therapy: Secondary | ICD-10-CM | POA: Diagnosis not present

## 2019-01-25 ENCOUNTER — Other Ambulatory Visit: Payer: Self-pay

## 2019-01-25 ENCOUNTER — Ambulatory Visit
Admission: RE | Admit: 2019-01-25 | Discharge: 2019-01-25 | Disposition: A | Discharge status: Home / Self Care | Payer: PPO | Source: Ambulatory Visit | Attending: Radiation Oncology | Admitting: Radiation Oncology

## 2019-01-25 DIAGNOSIS — C50411 Malignant neoplasm of upper-outer quadrant of right female breast: Secondary | ICD-10-CM | POA: Diagnosis not present

## 2019-01-25 DIAGNOSIS — Z510 Encounter for antineoplastic radiation therapy: Secondary | ICD-10-CM | POA: Diagnosis not present

## 2019-01-26 ENCOUNTER — Other Ambulatory Visit: Payer: Self-pay

## 2019-01-26 ENCOUNTER — Ambulatory Visit
Admission: RE | Admit: 2019-01-26 | Discharge: 2019-01-26 | Disposition: A | Discharge status: Home / Self Care | Payer: PPO | Source: Ambulatory Visit | Attending: Radiation Oncology | Admitting: Radiation Oncology

## 2019-01-26 DIAGNOSIS — C50411 Malignant neoplasm of upper-outer quadrant of right female breast: Secondary | ICD-10-CM | POA: Diagnosis not present

## 2019-01-26 DIAGNOSIS — Z510 Encounter for antineoplastic radiation therapy: Secondary | ICD-10-CM | POA: Diagnosis not present

## 2019-01-29 ENCOUNTER — Encounter: Payer: Self-pay | Admitting: Radiation Oncology

## 2019-01-29 ENCOUNTER — Ambulatory Visit
Admission: RE | Admit: 2019-01-29 | Discharge: 2019-01-29 | Disposition: A | Discharge status: Home / Self Care | Payer: PPO | Source: Ambulatory Visit | Attending: Radiation Oncology | Admitting: Radiation Oncology

## 2019-01-29 ENCOUNTER — Other Ambulatory Visit: Payer: Self-pay

## 2019-01-29 ENCOUNTER — Encounter: Payer: Self-pay | Admitting: *Deleted

## 2019-01-29 DIAGNOSIS — C50411 Malignant neoplasm of upper-outer quadrant of right female breast: Secondary | ICD-10-CM | POA: Diagnosis not present

## 2019-01-29 DIAGNOSIS — Z510 Encounter for antineoplastic radiation therapy: Secondary | ICD-10-CM | POA: Diagnosis not present

## 2019-02-06 ENCOUNTER — Ambulatory Visit (INDEPENDENT_AMBULATORY_CARE_PROVIDER_SITE_OTHER): Payer: PPO | Admitting: Orthopaedic Surgery

## 2019-02-12 ENCOUNTER — Encounter: Payer: Self-pay | Admitting: *Deleted

## 2019-02-12 NOTE — Progress Notes (Signed)
[place]  [phone] [phone]     ID: MARIACHRISTINA HOLLE DOB: 1947-06-74  MR#: [id number]  VPX#:[id number]  Patient Care Team: Lawerance Cruel, MD as PCP - General (Family Medicine) Mcarthur Rossetti, MD as Consulting Physician (Orthopedic Surgery) Brylynn Hanssen, Virgie Dad, MD as Consulting Physician (Oncology) Kyung Rudd, MD as Consulting Physician (Radiation Oncology) Huel Cote, NP as Nurse Practitioner (Obstetrics and Gynecology) Clarene Essex, MD as Consulting Physician (Gastroenterology) Coralie Keens, MD as Consulting Physician (General Surgery) Mauro Kaufmann, RN as Oncology Nurse Navigator Rockwell Germany, RN as Oncology Nurse Navigator OTHER MD:   CHIEF COMPLAINT: Estrogen receptor positive breast cancer   CURRENT TREATMENT: Anastrozole   HISTORY OF CURRENT ILLNESS: From the original intake note:  Jenny Reichmann" had routine screening mammography on 10/04/2018 showing a possible abnormality in the right breast. She underwent unilateral right diagnostic mammography with tomography and right breast ultrasonography at [facility] on 10/12/2018 showing: Breast Density Category C. Spot compression tomosynthesis images through the upper outer quadrant of the right breast demonstrates a persistent irregular mass with indistinct margins measuring approximately 1 cm. On physical exam, no definite palpable mass is identified in the upper-outer right breast. Targeted ultrasound is performed, showing an irregular hypoechoic shadowing mass at 10:30 oclock, 6 cm from the nipple measuring 9 x 8 x 9 mm. Ultrasound of the right axilla demonstrates multiple normal-appearing lymph nodes.  Accordingly on 10/18/2018 she proceeded to biopsy of the right breast area in question. The pathology from this procedure showed ([id number]): invasive ductal carcinoma, grade II. Prognostic indicators significant for: estrogen receptor, 100% positive and  progesterone receptor, 20% positive, both with strong staining intensity. Proliferation marker Ki67 at 5%. HER2 equivocal (2+) by immunohistochemisty but negative by fluorescent in situ hybridization with a signals ratio 1.26 and number per cell 2.40.   The patient's subsequent history is as detailed below.   INTERVAL HISTORY: Rida returns today for follow-up and treatment of her estrogen receptor positive breast cancer. She is accompanied by her husband and daughter via phone.   Since her last visit here, she underwent a right lumpectomy on 11/16/2018. The pathology from this procedure showed ([id number]):  1. Breast, lumpectomy, right w/seed - invasive ductal carcinoma, 1.2 cm, nottingham grade 2 of 3. - margins of resection are not involved (closest margin: 1.5 mm, anterior). - biopsy site changes. - see oncology table. 2. Lymph node, sentinel, biopsy, right   - one lymph node, negative for carcinoma (0/1).  She also underwent genetic testing on 11/27/2018 through the multi cancer cancer panel, which found no deleterious mutations in AIP, ALK, APC, ATM, AXIN2,BAP1,  BARD1, BLM, BMPR1A, BRCA1, BRCA2, BRIP1, CASR, CDC73, CDH1, CDK4, CDKN1B, CDKN1C, CDKN2A (p14ARF), CDKN2A (p16INK4a), CEBPA, CHEK2, CTNNA1, DICER1, DIS3L2, EGFR (c.2369C>T, p.Thr790Met variant only), EPCAM (Deletion/duplication testing only), FH, FLCN, GATA2, GPC3, GREM1 (Promoter region deletion/duplication testing only), HOXB13 (c.251G>A, p.Gly84Glu), HRAS, KIT, MAX, MEN1, MET, MITF (c.952G>A, p.Glu318Lys variant only), MLH1, MSH2, MSH3, MSH6, MUTYH, NBN, NF1, NF2, NTHL1, PALB2, PDGFRA, PHOX2B, PMS2, POLD1, POLE, POT1, PRKAR1A, PTCH1, PTEN, RAD50, RAD51C, RAD51D, RB1, RECQL4, RET, RUNX1, SDHAF2, SDHA (sequence changes only), SDHB, SDHC, SDHD, SMAD4, SMARCA4, SMARCB1, SMARCE1, STK11, SUFU, TERC, TERT, TMEM127, TP53, TSC1, TSC2, VHL, WRN and WT1.  Finally, she completed radiation, which lasted from 01/02/2019 to 01/29/2019. She  had some peeling and discoloration. She also had some fatigue, which she has not fully recovered from.   She is here today to discuss antiestrogen therapy.  REVIEW OF SYSTEMS: Ambrie said that her surgery went well; she has had some discomfort, but not as much as she expected. She notes some discomfort in her upper right arm on the outside. She also notes some scarring in right breast. She is having some difficulty with her right nipple, which is tender. She occasionally experiences shooting pains in her breast. Nashiya tries to maintain a schedule as much as possible, but she gets tired more easily than in the past. She notes some new medical issues with her husband that is stressful for her. In light of COVID-19, she has begun to order her groceries online, but her daughter has also picked groceries up for her as well. For exercise, she walks around her neighborhood each day.   The patient denies unusual headaches, visual changes, nausea, vomiting, or dizziness. There has been no unusual cough, phlegm production, or pleurisy. This been no change in bowel or bladder habits. The patient denies unexplained weight loss, bleeding, rash, or fever. A detailed review of systems was otherwise noncontributory.    PAST MEDICAL HISTORY: Past Medical History:  Diagnosis Date   Arthritis    Back, Hip- right   Cancer ([place])    right breast   Cataract    Diabetes mellitus without complication ([place])    Type II   Endometriosis    Family history of adverse reaction to anesthesia    Father - N/V - blood pressure; daughter N/V   Family history of breast cancer    Family history of colon cancer    Family history of kidney cancer    Family history of prostate cancer    GERD (gastroesophageal reflux disease)    History of kidney stones    History of ulcerative colitis    Hypertension    Insomnia    Lumbar disc disease    L4 L5   Neuropathy    Pneumonia    1983, 2003   PVCs  (premature ventricular contractions)    benign   Umbilical hernia    Vitamin D deficiency    Wears glasses      PAST SURGICAL HISTORY: Past Surgical History:  Procedure Laterality Date   APPENDECTOMY     BREAST BIOPSY  08-13-2009  DR TSUEI   EXCISION LEFT NIPPLE DUCT   BREAST EXCISIONAL BIOPSY Left    x2   BREAST EXCISIONAL BIOPSY Right    CHOLECYSTECTOMY  1992   COLON RESECTION  1986   ULCERATIVE COLITIS   COLON SURGERY     ESOPHAGEAL MANOMETRY N/A 06/11/2013   Procedure: ESOPHAGEAL MANOMETRY (EM);  Surgeon: Jeryl Columbia, MD;  Location: [facility];  Service: Endoscopy;  Laterality: N/A;   ESOPHAGOGASTRODUODENOSCOPY (EGD) WITH PROPOFOL N/A 05/31/2016   Procedure: ESOPHAGOGASTRODUODENOSCOPY (EGD) WITH PROPOFOL;  Surgeon: Clarene Essex, MD;  Location[facility] ENDOSCOPY;  Service: Endoscopy;  Laterality: N/A;   EXCISION RIGHT BREAST MASS   10-20-2005  DR Collier Salina YOUNG   FRACTURE SURGERY     left arm   kidney stone removed  2/11   KNEE ARTHROSCOPY Left    MCL   LEFT THUMB CARPOMETACARPAL JOINT SUSPENSIONPLASTY  04-06-2006  DR Burney Gauze   LEFT WRIST ARTHROSCOPY W/ DEBRIDEMENT AND REMOVAL CYST  03-26-2004  DR Burney Gauze   MYOMECTOMY     RADIOACTIVE SEED GUIDED PARTIAL MASTECTOMY WITH AXILLARY SENTINEL LYMPH NODE BIOPSY Right 11/16/2018   Procedure: RIGHT BREAST RADIOACTIVE SEED GUIDED PARTIAL MASTECTOMY WITH  SENTINEL  NODE BIOPSY;  Surgeon: Coralie Keens, MD;  Location: [facility];  Service: General;  Laterality: Right;   RIGHT COLECTOMY     RIGHT URETEROSCOPIC STONE EXTRACTION  12-11-2009  DR Jenny Reichmann [facility]   TENDON RELEASE     TONSILLECTOMY     TOTAL HIP ARTHROPLASTY Right 11/26/2016   Procedure: RIGHT TOTAL HIP ARTHROPLASTY ANTERIOR APPROACH;  Surgeon: Mcarthur Rossetti, MD;  Location: [facility];  Service: Orthopedics;  Laterality: Right;   TOTAL KNEE ARTHROPLASTY Left 12/02/2017   Procedure: LEFT TOTAL KNEE ARTHROPLASTY;  Surgeon: Mcarthur Rossetti, MD;   Location: [facility];  Service: Orthopedics;  Laterality: Left;   WRIST SURGERY     both     FAMILY HISTORY: Family History  Problem Relation Age of Onset   Hypertension Father    Heart disease Father    Diabetes Father    Cancer Father        colon, prostate, and kidney   Hypertension Mother    Parkinson's disease Mother    Heart disease Maternal Grandmother    Diabetes Maternal Grandmother    Heart disease Maternal Grandfather    Diabetes Maternal Grandfather    Cancer Maternal Grandfather        pt unaware of what kind   Stroke Paternal Grandmother    Stroke Paternal Grandfather    Colon cancer Paternal Aunt        dx less than 4   Cancer Maternal Aunt        cervical vs ovarian   Prostate cancer Paternal Uncle    Prostate cancer Other        PGF's father   Breast cancer Paternal Aunt        dx in her 58s   Breast cancer Cousin        pat first cousin, dx over 74   Cindy's father died from colon cancer at age 53; he was diagnosed with prostate cancer at 57, and he also had kidney cancer. Patients' mother died from failure to thrive at age 14. The patient has no siblings. Jenny Reichmann had a paternal aunt that was diagnosed with breast cancer in her mid 45's. Jenny Reichmann also has a paternal great grandfather that was diagnosed with prostate cancer. Patient denies anyone in her family having ovarian cancer.    GYNECOLOGIC HISTORY:  No LMP recorded. Patient is postmenopausal. 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 Her husband, [name], ran a Building surveyor. Together, they have two children, Richard and [employer]. Their son, Delfino Lovett, lives in [place] and is disabled (MS). Their daughter, [name], is a Physiological scientist (and also has MS). Jenny Reichmann  has 5 grandchildren, no great grandchildren. She attends a [employer].    ADVANCED DIRECTIVES: To be discussed   HEALTH MAINTENANCE: Social History   Tobacco Use   Smoking status: Former Smoker    Packs/day: 1.00    Years: 10.00    Pack years: 10.00    Types: Cigarettes    Last attempt to quit: 09/22/1982    Years since quitting: 36.4   Smokeless tobacco: Never Used   Tobacco comment: quit 1983  Substance Use Topics   Alcohol use: No   Drug use: No    Colonoscopy: yes, 3 or 4 years ago  PAP: Stopped at 37; Elon Alas, NP - [facility]  Bone density: yes, 2+ years ago with unknown result; Elon Alas, NP - [facility]  Allergies  Allergen Reactions   Ciprofloxacin Hcl Hives   Percocet [Oxycodone-Acetaminophen] Other (See Comments)    RESPIRATORY DISTRESS   Penicillins Hives and Other (See Comments)    Has patient had a PCN reaction causing immediate rash, facial/tongue/throat swelling, SOB or lightheadedness with hypotension: no Has patient had a PCN reaction causing severe rash involving mucus membranes or skin necrosis: no Has patient had a PCN reaction that required hospitalization no Has patient had a PCN reaction occurring within the last 10 years: no If all of the above answers are "NO", then may proceed with Cephalosporin use.   Sulfa Antibiotics Hives   Tape Other (See Comments)    Adhesives - Causes Blisters. No band aids, and plastic tape    Tetanus Toxoids Hives and Rash    Current Outpatient Medications  Medication Sig Dispense Refill   amLODipine (NORVASC) 5 MG tablet TAKE 1 TABLET BY MOUTH ONCE DAILY *NEED  OFFICE  VISIT* (Patient taking differently: Take 5 mg by mouth daily. ) 90 tablet 3   anastrozole (ARIMIDEX) 1 MG tablet Take 1 tablet (1 mg total) by mouth daily. 90 tablet 4   aspirin EC 81 MG tablet Take 81 mg by mouth daily.     b complex vitamins tablet Take 1 tablet by mouth daily.       balsalazide (COLAZAL) 750 MG capsule Take 2,250 mg by mouth 3 (three) times daily.      Cholecalciferol (HM VITAMIN D3) 2000 units CAPS Take 2,000 Units by mouth daily.      cholestyramine (QUESTRAN) 4 GM/DOSE powder Take 4 g by mouth daily.      furosemide (LASIX) 20 MG tablet Take 20 mg by mouth daily.      gabapentin (NEURONTIN) 300 MG capsule Take 600 mg by mouth at bedtime.   1   Lancets (ONETOUCH DELICA PLUS [id number]) MISC      lidocaine (ASPERCREME W/LIDOCAINE) 4 % cream Apply 1 application topically daily as needed (for joint pain).      magnesium gluconate (MAGONATE) 500 MG tablet Take 500 mg by mouth at bedtime.     metFORMIN (GLUCOPHAGE-XR) 500 MG 24 hr tablet Take 1,000 mg by mouth 2 (two) times daily.      methocarbamol (ROBAXIN) 500 MG tablet Take 1 tablet (500 mg total) by mouth every 6 (six) hours as needed for muscle spasms. (Patient taking differently: Take 500 mg by mouth at bedtime. ) 60 tablet 0   Misc Natural Products (JOINT HEALTH) CAPS Take 300 mg by mouth daily. Arthrocen 300 mg Supplement     ONETOUCH VERIO test strip      Oxymetazoline HCl (NASAL SPRAY NA) Place 1-2 sprays into both nostrils at bedtime as needed (for allergies).     pantoprazole (PROTONIX) 40 MG tablet Take 40 mg by mouth 2 (two) times daily.      pioglitazone (ACTOS) 15 MG tablet Take 15 mg by mouth daily.     quinapril (ACCUPRIL) 40 MG tablet Take 40 mg by mouth daily.     rosuvastatin (CRESTOR) 20 MG tablet TAKE 1 TABLET BY MOUTH EVERY DAY (Patient taking differently: Take 20 mg by mouth daily. ) 90 tablet 1   No current facility-administered medications for this visit.      OBJECTIVE: Middle-aged white woman who appears stated age  73:   02/13/19 1339  BP: 123/60  Pulse: 85  Resp:  18  Temp: 98.4 F (36.9 C)  SpO2: 96%     Body mass index is 35.87 kg/m.   Wt Readings from Last 3 Encounters:  02/13/19 218 lb 14.4 oz (99.3 kg)  12/12/18 218 lb 4 oz (99 kg)   12/12/18 218 lb 6.4 oz (99.1 kg)      ECOG FS:1 - Symptomatic but completely ambulatory  Sclerae unicteric, EOMs intact No cervical or supraclavicular adenopathy Lungs no rales or rhonchi Heart regular rate and rhythm Abd soft, nontender, positive bowel sounds MSK no focal spinal tenderness, no upper extremity lymphedema Neuro: nonfocal, well oriented, appropriate affect Breasts: The right breast is status post lumpectomy and radiation.  The cosmetic result is good.  There is still some hyperpigmentation and some dry desquamation in the axilla.  There is no evidence of residual or recurrent disease.  The left breast is unremarkable.  The left axilla is unremarkable.  LAB RESULTS:  CMP     Component Value Date/Time   NA 141 11/27/2018 1505   K 4.9 11/27/2018 1505   CL 104 11/27/2018 1505   CO2 28 11/27/2018 1505   GLUCOSE 247 (H) 11/27/2018 1505   BUN 13 11/27/2018 1505   CREATININE 1.07 (H) 11/27/2018 1505   CREATININE 0.96 11/09/2018 1405   CALCIUM 9.6 11/27/2018 1505   PROT 7.1 11/27/2018 1505   ALBUMIN 3.7 11/27/2018 1505   AST 31 11/27/2018 1505   AST 34 11/09/2018 1405   ALT 22 11/27/2018 1505   ALT 24 11/09/2018 1405   ALKPHOS 68 11/27/2018 1505   BILITOT 0.3 11/27/2018 1505   BILITOT 0.4 11/09/2018 1405   GFRNONAA 51 (L) 11/27/2018 1505   GFRNONAA 59 (L) 11/09/2018 1405   GFRAA 60 (L) 11/27/2018 1505   GFRAA >60 11/09/2018 1405    No results found for: TOTALPROTELP, ALBUMINELP, A1GS, A2GS, BETS, BETA2SER, GAMS, MSPIKE, SPEI  No results found for: KPAFRELGTCHN, LAMBDASER, KAPLAMBRATIO  Lab Results  Component Value Date   WBC 9.3 11/27/2018   NEUTROABS 5.8 11/27/2018   HGB 12.6 11/27/2018   HCT 40.5 11/27/2018   MCV 88.8 11/27/2018   PLT 289 11/27/2018    @LASTCHEMISTRY @  No results found for: LABCA2  No components found for: [id number]  No results for input(s): INR in the last 168 hours.  No results found for: LABCA2  No results found for:  CLE751  No results found for: ZGY174  No results found for: BSW967  No results found for: CA2729  No components found for: HGQUANT  No results found for: CEA1 / No results found for: CEA1   No results found for: AFPTUMOR  No results found for: CHROMOGRNA  No results found for: PSA1  No visits with results within 3 Day(s) from this visit.  Latest known visit with results is:  Appointment on 11/27/2018  Component Date Value Ref Range Status   Sodium 11/27/2018 141  135 - 145 mmol/L Final   Potassium 11/27/2018 4.9  3.5 - 5.1 mmol/L Final   Chloride 11/27/2018 104  98 - 111 mmol/L Final   CO2 11/27/2018 28  22 - 32 mmol/L Final   Glucose, Bld 11/27/2018 247* 70 - 99 mg/dL Final   BUN 11/27/2018 13  8 - 23 mg/dL Final   Creatinine, Ser 11/27/2018 1.07* 0.44 - 1.00 mg/dL Final   Calcium 11/27/2018 9.6  8.9 - 10.3 mg/dL Final   Total Protein 11/27/2018 7.1  6.5 - 8.1 g/dL Final   Albumin 11/27/2018 3.7  3.5 - 5.0  g/dL Final   AST 11/27/2018 31  15 - 41 U/L Final   ALT 11/27/2018 22  0 - 44 U/L Final   Alkaline Phosphatase 11/27/2018 68  38 - 126 U/L Final   Total Bilirubin 11/27/2018 0.3  0.3 - 1.2 mg/dL Final   GFR calc non Af Amer 11/27/2018 51* >60 mL/min Final   GFR calc Af Amer 11/27/2018 60* >60 mL/min Final   Anion gap 11/27/2018 9  5 - 15 Final   Performed at [facility], [place] [address] [place], [place] [address]   WBC 11/27/2018 9.3  4.0 - 10.5 K/uL Final   RBC 11/27/2018 4.56  3.87 - 5.11 MIL/uL Final   Hemoglobin 11/27/2018 12.6  12.0 - 15.0 g/dL Final   HCT 11/27/2018 40.5  36.0 - 46.0 % Final   MCV 11/27/2018 88.8  80.0 - 100.0 fL Final   MCH 11/27/2018 27.6  26.0 - 34.0 pg Final   MCHC 11/27/2018 31.1  30.0 - 36.0 g/dL Final   RDW 11/27/2018 14.3  11.5 - 15.5 % Final   Platelets 11/27/2018 289  150 - 400 K/uL Final   nRBC 11/27/2018 0.0  0.0 - 0.2 % Final   Neutrophils Relative % 11/27/2018 63  % Final     Neutro Abs 11/27/2018 5.8  1.7 - 7.7 K/uL Final   Lymphocytes Relative 11/27/2018 21  % Final   Lymphs Abs 11/27/2018 2.0  0.7 - 4.0 K/uL Final   Monocytes Relative 11/27/2018 10  % Final   Monocytes Absolute 11/27/2018 0.9  0.1 - 1.0 K/uL Final   Eosinophils Relative 11/27/2018 5  % Final   Eosinophils Absolute 11/27/2018 0.5  0.0 - 0.5 K/uL Final   Basophils Relative 11/27/2018 1  % Final   Basophils Absolute 11/27/2018 0.1  0.0 - 0.1 K/uL Final   Immature Granulocytes 11/27/2018 0  % Final   Abs Immature Granulocytes 11/27/2018 0.04  0.00 - 0.07 K/uL Final   Performed at [facility], [address] [place], [place] [address]    (this displays the last labs from the last 3 days)  No results found for: TOTALPROTELP, ALBUMINELP, A1GS, A2GS, BETS, BETA2SER, GAMS, MSPIKE, SPEI (this displays SPEP labs)  No results found for: KPAFRELGTCHN, LAMBDASER, KAPLAMBRATIO (kappa/lambda light chains)  No results found for: HGBA, HGBA2QUANT, HGBFQUANT, HGBSQUAN (Hemoglobinopathy evaluation)   No results found for: LDH  No results found for: IRON, TIBC, IRONPCTSAT (Iron and TIBC)  No results found for: FERRITIN  Urinalysis    Component Value Date/Time   COLORURINE YELLOW 09/19/2018 [place] 09/19/2018 1143   LABSPEC 1.010 09/19/2018 1143   PHURINE 6.0 09/19/2018 1143   GLUCOSEU 3+ (A) 09/19/2018 1143   HGBUR TRACE (A) 09/19/2018 1143   BILIRUBINUR NEGATIVE 11/05/2015 [place] 09/19/2018 1143   PROTEINUR NEGATIVE 09/19/2018 1143   UROBILINOGEN 0.2 11/28/2013 1456   NITRITE NEGATIVE 11/05/2015 1512   LEUKOCYTESUR 1+ (A) 11/05/2015 1512     STUDIES:  No results found.   ELIGIBLE FOR AVAILABLE RESEARCH PROTOCOL: no   ASSESSMENT: 73 y.o. [place], [place] woman status post right breast upper outer quadrant biopsy 10/18/2018 for a clinical T1b N0, stage IA invasive ductal carcinoma, grade 2, estrogen and  progesterone receptor positive, MIB-1 5%, with no HER-2 amplification by FISH  (1) definitive surgery 11/16/2018  (a) Oncotype also read tumor is HER-2 negative  (2) Oncotype score of 12 predicted a risk of recurrence outside  the breast over the next 9 years of only 3% if the patient's only systemic treatment was antiestrogens for 5 years.  No significant benefit was anticipated from chemotherapy.  (3) adjuvant radiation 01/02/2019 to 01/29/2019.  (4) anastrozole started 02/13/2019  (a) bone density 01/06/2012 was normal  (5) genetics testing 11/27/2018 through the Multi-Gene Panel offered by [facility] found no deleterious mutations in AIP, ALK, APC, ATM, AXIN2,BAP1,  BARD1, BLM, BMPR1A, BRCA1, BRCA2, BRIP1, CASR, CDC73, CDH1, CDK4, CDKN1B, CDKN1C, CDKN2A (p14ARF), CDKN2A (p16INK4a), CEBPA, CHEK2, CTNNA1, DICER1, DIS3L2, EGFR (c.2369C>T, p.Thr790Met variant only), EPCAM (Deletion/duplication testing only), FH, FLCN, GATA2, GPC3, GREM1 (Promoter region deletion/duplication testing only), HOXB13 (c.251G>A, p.Gly84Glu), HRAS, KIT, MAX, MEN1, MET, MITF (c.952G>A, p.Glu318Lys variant only), MLH1, MSH2, MSH3, MSH6, MUTYH, NBN, NF1, NF2, NTHL1, PALB2, PDGFRA, PHOX2B, PMS2, POLD1, POLE, POT1, PRKAR1A, PTCH1, PTEN, RAD50, RAD51C, RAD51D, RB1, RECQL4, RET, RUNX1, SDHAF2, SDHA (sequence changes only), SDHB, SDHC, SDHD, SMAD4, SMARCA4, SMARCB1, SMARCE1, STK11, SUFU, TERC, TERT, TMEM127, TP53, TSC1, TSC2, VHL, WRN and WT1.    (a) a variant of uncertain significant was found in DICER1, c.2118C>T (silent)  PLAN: Jenny Reichmann did generally well with her surgery and radiation.  She is still recovering from some of the radiation fatigue and I assured her that it sometimes takes 2 to 4 months to fully get back to normal.  In the meantime the more active she is the quicker she will get out of that "hole".  We reviewed her Oncotype results which are very favorable and which tell us that she will not need chemotherapy to have a  very good prognosis and that if she did receive chemotherapy she would not get any benefit  Instead her systemic treatment will be antiestrogens.  It is important to note the differences between tamoxifen and anastrozole for example. @PATIENTFIRSTNAME @   has completed her local treatment and is now ready to start anti-estrogens.  We discussed the difference between tamoxifen and anastrozole in detail. She understands that anastrozole and the aromatase inhibitors in general work by blocking estrogen production. Accordingly vaginal dryness, decrease in bone density, and of course hot flashes can result. The aromatase inhibitors can also negatively affect the cholesterol profile, although that is a minor effect. One out of 5 women on aromatase inhibitors we will feel "old and achy". This arthralgia/myalgia syndrome, which resembles fibromyalgia clinically, does resolve with stopping the medications. Accordingly this is not a reason to not try an aromatase inhibitor but it is a frequent reason to stop it (in other words 20% of women will not be able to tolerate these medications).  Tamoxifen on the other hand does not block estrogen production. It does not "take away a woman's estrogen". It blocks the estrogen receptor in breast cells. Like anastrozole, it can also cause hot flashes. As opposed to anastrozole, tamoxifen has many estrogen-like effects. It is technically an estrogen receptor modulator. This means that in some tissues tamoxifen works like estrogen-- for example it helps strengthen the bones. It tends to improve the cholesterol profile. It can cause thickening of the endometrial lining, and even endometrial polyps or rarely cancer of the uterus.(The risk of uterine cancer due to tamoxifen is one additional cancer per thousand women year). It can cause vaginal wetness or stickiness. It can cause blood clots through this estrogen-like effect--the risk of blood clots with tamoxifen is exactly the same as  with birth control pills or hormone replacement.  Neither of these agents causes mood changes or weight gain, despite the popular belief  that they can have these side effects. We have data from studies comparing either of these drugs with placebo, and in those cases the control group had the same amount of weight gain and depression as the group that took the drug.  Given all this done with a normal bone density in 2013 we are going to go for anastrozole.  I placed the prescription in for her.  She will see me again in 3 months.  If she is tolerating it well we will obtain another bone density just to make sure.  Otherwise we will consider alternatives.  Incidentally from certain comments she made today I suggested she read the 36-hour day.   Imanuel Pruiett, Virgie Dad, MD  02/13/19 2:06 PM Medical Oncology and Hematology [facility] [address] [place], [place] [address] Tel. [phone]    Fax. [phone]   I, Jacqualyn Posey am acting as a Education administrator for Chauncey Cruel, MD.   I, Lurline Del MD, have reviewed the above documentation for accuracy and completeness, and I agree with the above.

## 2019-02-12 NOTE — Telephone Encounter (Signed)
This encounter was created in error - please disregard.

## 2019-02-13 ENCOUNTER — Inpatient Hospital Stay: Payer: PPO | Attending: Oncology | Admitting: Oncology

## 2019-02-13 ENCOUNTER — Other Ambulatory Visit: Payer: Self-pay

## 2019-02-13 VITALS — BP 123/60 | HR 85 | Temp 98.4°F | Resp 18 | Ht 65.5 in | Wt 218.9 lb

## 2019-02-13 DIAGNOSIS — Z170 Estrogen receptor positive status [ER+]: Secondary | ICD-10-CM | POA: Insufficient documentation

## 2019-02-13 DIAGNOSIS — E119 Type 2 diabetes mellitus without complications: Secondary | ICD-10-CM

## 2019-02-13 DIAGNOSIS — M1990 Unspecified osteoarthritis, unspecified site: Secondary | ICD-10-CM | POA: Diagnosis not present

## 2019-02-13 DIAGNOSIS — Z8042 Family history of malignant neoplasm of prostate: Secondary | ICD-10-CM

## 2019-02-13 DIAGNOSIS — Z800 Family history of malignant neoplasm of digestive organs: Secondary | ICD-10-CM | POA: Diagnosis not present

## 2019-02-13 DIAGNOSIS — Z79811 Long term (current) use of aromatase inhibitors: Secondary | ICD-10-CM

## 2019-02-13 DIAGNOSIS — C50411 Malignant neoplasm of upper-outer quadrant of right female breast: Secondary | ICD-10-CM | POA: Insufficient documentation

## 2019-02-13 DIAGNOSIS — Z780 Asymptomatic menopausal state: Secondary | ICD-10-CM

## 2019-02-13 DIAGNOSIS — Z7984 Long term (current) use of oral hypoglycemic drugs: Secondary | ICD-10-CM | POA: Diagnosis not present

## 2019-02-13 DIAGNOSIS — Z8051 Family history of malignant neoplasm of kidney: Secondary | ICD-10-CM | POA: Diagnosis not present

## 2019-02-13 DIAGNOSIS — R5383 Other fatigue: Secondary | ICD-10-CM

## 2019-02-13 DIAGNOSIS — Z923 Personal history of irradiation: Secondary | ICD-10-CM | POA: Diagnosis not present

## 2019-02-13 DIAGNOSIS — Z79899 Other long term (current) drug therapy: Secondary | ICD-10-CM | POA: Insufficient documentation

## 2019-02-13 DIAGNOSIS — Z7982 Long term (current) use of aspirin: Secondary | ICD-10-CM

## 2019-02-13 MED ORDER — ANASTROZOLE 1 MG PO TABS: 1.0000 mg | ORAL_TABLET | Freq: Every day | ORAL | 4 refills | Status: DC

## 2019-02-15 ENCOUNTER — Telehealth: Payer: Self-pay | Admitting: Cardiovascular Disease

## 2019-02-15 NOTE — Telephone Encounter (Signed)
Smart phone/mychart/consent obtained/pre reg complete/dc/04.16.2020

## 2019-02-16 ENCOUNTER — Telehealth (INDEPENDENT_AMBULATORY_CARE_PROVIDER_SITE_OTHER): Payer: PPO | Admitting: Cardiovascular Disease

## 2019-02-16 DIAGNOSIS — I2584 Coronary atherosclerosis due to calcified coronary lesion: Secondary | ICD-10-CM

## 2019-02-16 DIAGNOSIS — I2510 Atherosclerotic heart disease of native coronary artery without angina pectoris: Secondary | ICD-10-CM

## 2019-02-16 DIAGNOSIS — E7800 Pure hypercholesterolemia, unspecified: Secondary | ICD-10-CM

## 2019-02-16 DIAGNOSIS — I10 Essential (primary) hypertension: Secondary | ICD-10-CM

## 2019-02-16 NOTE — Patient Instructions (Signed)
Medication Instructions:  Your physician recommends that you continue on your current medications as directed. Please refer to the Current Medication list given to you today.  If you need a refill on your cardiac medications before your next appointment, please call your pharmacy.   Lab work: NONE  Testing/Procedures: NONE  Follow-Up: At [facility], you and your health needs are our priority.  As part of our continuing mission to provide you with exceptional heart care, we have created designated Provider Care Teams.  These Care Teams include your primary Cardiologist (physician) and Advanced Practice Providers (APPs -  Physician Assistants and Nurse Practitioners) who all work together to provide you with the care you need, when you need it. You will need a follow up appointment in 12 MONTHS.  Please call our office 2 months in advance to schedule this appointment.  You may see Skeet Latch, MD or one of the following Advanced Practice Providers on your designated Care Team:   Kerin Ransom, PA-C Roby Lofts, [place] . Sande Rives, PA-C

## 2019-02-16 NOTE — Progress Notes (Signed)
Virtual Visit via Video Note   This visit type was conducted due to national recommendations for restrictions regarding the COVID-19 Pandemic (e.g. social distancing) in an effort to limit this patient's exposure and mitigate transmission in our community.  Due to her co-morbid illnesses, this patient is at least at moderate risk for complications without adequate follow up.  This format is felt to be most appropriate for this patient at this time.  All issues noted in this document were discussed and addressed.  A limited physical exam was performed with this format.  Please refer to the patient's chart for her consent to telehealth for [facility].   Evaluation Performed:  Follow-up visit  Date:  02/16/2019   ID:  Lauren [name], DOB 1946/08/09, MRN [id number]  Patient Location: Home Provider Location: Office  PCP:  Lawerance Cruel, MD  Cardiologist:  Skeet Latch, MD  Electrophysiologist:  None   Chief Complaint:  Follow up  History of Present Illness:    Lauren [name] is a 73 y.o. female with asymptomatic coronary calcification, diabetes, hypertension, hyperlipidemia, breast cancer s/p surgery/XRT/chemo and family history of CAD who presents for follow up.    She was initially seen 09/2015 for an evaluation of chest pain.  She had been seen in the emergency department  where cardiac enzymes were negative.  D-dimer was elevated, so she underwent CT-A of the chest that was negative for PE. She had a [employer] 11/2016 that revealed LVEF 72% and no ischemia.  Amlodipine was added to her regimen and her blood pressure has been much better controlled since that time.  She underwent right hip replacement 11/2016.  Since her last appointment she also had the left knee replaced 12/2017.  Overall she has felt well.  It is been more challenging to rehab from her knee than the hip.  However, for the last 3 months she has noted occasional episodes of substernal chest pressure.   It occurs somewhat randomly.  Sometimes it happens when she is just sitting at the table.  However she also notes that when she is walking or when she is doing work in [employer].  The episodes last for proximally 1 minute.  They are not associated with shortness of breath, nausea, or diaphoresis.  She has not been exercising as much lately because her husband has been sick from heart failure.  To have a large backyard and like to walk labs when the weather is nice.  She also joined a gym that excepts the Silver sneakers program in hopes to start back walking more soon.  She has not had any lower extremity edema, orthopnea, or PND.  She gets very nervous whenever she has chest pain because her father had recurrent heart attacks and she has 2 paternal uncles who died suddenly of heart disease.    At her last appointment Lauren [name] reported chest discomfort.  She was referred for [facility] 09/2018 that revealed LVEF 77% and no ischemia. Since her appointment she was diagnosed with breast cancer.  She had a routine mammogram in December and was found to have localized disease.  She underwent lumpectomy in January followed by radiation.  She finished her last radiation treatment 2 weeks ago.  She continues to feel fatigued since starting radiation.  She just started anastrozole today.  Otherwise she is feeling well.  She has not experienced any chest pain other than when she feels anxious.  She has no shortness of breath, lower extremity  edema, orthopnea, or PND.  Her blood pressure has been well-controlled.  The patient does not have symptoms concerning for COVID-19 infection (fever, chills, cough, or new shortness of breath).    Past Medical History:  Diagnosis Date  . Arthritis    Back, Hip- right  . Cancer ([place])    right breast  . Cataract   . Diabetes mellitus without complication ([place])    Type II  . Endometriosis   . Family history of adverse reaction to anesthesia    Father - N/V -  blood pressure; daughter N/V  . Family history of breast cancer   . Family history of colon cancer   . Family history of kidney cancer   . Family history of prostate cancer   . GERD (gastroesophageal reflux disease)   . History of kidney stones   . History of ulcerative colitis   . Hypertension   . Insomnia   . 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 Neuropathy   . Pneumonia    1983, 2003  . PVCs (premature ventricular contractions)    benign  . Umbilical hernia   . Vitamin D deficiency   . Wears glasses    Past Surgical History:  Procedure Laterality Date  . APPENDECTOMY    . BREAST BIOPSY  08-13-2009  DR TSUEI   EXCISION LEFT NIPPLE DUCT  . BREAST EXCISIONAL BIOPSY Left    x2  . BREAST EXCISIONAL BIOPSY Right   . CHOLECYSTECTOMY  1992  . COLON RESECTION  1986   ULCERATIVE COLITIS  . COLON SURGERY    . ESOPHAGEAL MANOMETRY N/A 06/11/2013   Procedure: ESOPHAGEAL MANOMETRY (EM);  Surgeon: Jeryl Columbia, MD;  Location: [facility] ENDOSCOPY;  Service: Endoscopy;  Laterality: N/A;  . ESOPHAGOGASTRODUODENOSCOPY (EGD) WITH PROPOFOL N/A 05/31/2016   Procedure: ESOPHAGOGASTRODUODENOSCOPY (EGD) WITH PROPOFOL;  Surgeon: Clarene Essex, MD;  Location[facility] ENDOSCOPY;  Service: Endoscopy;  Laterality: N/A;  . EXCISION RIGHT BREAST MASS   10-20-2005  DR Marylene Buerger  . FRACTURE SURGERY     left arm  . kidney stone removed  2/11  . KNEE ARTHROSCOPY Left    MCL  . LEFT THUMB CARPOMETACARPAL JOINT SUSPENSIONPLASTY  04-06-2006  DR Burney Gauze  . LEFT WRIST ARTHROSCOPY W/ DEBRIDEMENT AND REMOVAL CYST  03-26-2004  DR Burney Gauze  . MYOMECTOMY    . RADIOACTIVE SEED GUIDED PARTIAL MASTECTOMY WITH AXILLARY SENTINEL LYMPH NODE BIOPSY Right 11/16/2018   Procedure: RIGHT BREAST RADIOACTIVE SEED GUIDED PARTIAL MASTECTOMY WITH  SENTINEL  NODE BIOPSY;  Surgeon: Coralie Keens, MD;  Location: [place];  Service: General;  Laterality: Right;  . RIGHT COLECTOMY    . RIGHT URETEROSCOPIC STONE EXTRACTION  12-11-2009  DR Irine Seal   . TENDON RELEASE    . TONSILLECTOMY    . TOTAL HIP ARTHROPLASTY Right 11/26/2016   Procedure: RIGHT TOTAL HIP ARTHROPLASTY ANTERIOR APPROACH;  Surgeon: Mcarthur Rossetti, MD;  Location: [facility] ORS;  Service: Orthopedics;  Laterality: Right;  . TOTAL KNEE ARTHROPLASTY Left 12/02/2017   Procedure: LEFT TOTAL KNEE ARTHROPLASTY;  Surgeon: Mcarthur Rossetti, MD;  Location: [facility] ORS;  Service: Orthopedics;  Laterality: Left;  . WRIST SURGERY     both     Current Meds  Medication Sig  . amLODipine (NORVASC) 5 MG tablet TAKE 1 TABLET BY MOUTH ONCE DAILY *NEED  OFFICE  VISIT* (Patient taking differently: Take 5 mg by mouth daily. )  . anastrozole (ARIMIDEX) 1 MG tablet Take 1 tablet (1 mg  total) by mouth daily.  Marland Kitchen aspirin EC 81 MG tablet Take 81 mg by mouth daily.  Marland Kitchen b complex vitamins tablet Take 1 tablet by mouth daily.  . balsalazide (COLAZAL) 750 MG capsule Take 2,250 mg by mouth 3 (three) times daily.   . Cholecalciferol (HM VITAMIN D3) 2000 units CAPS Take 2,000 Units by mouth daily.   . cholestyramine (QUESTRAN) 4 GM/DOSE powder Take 4 g by mouth daily.   . furosemide (LASIX) 20 MG tablet Take 20 mg by mouth daily.   Marland Kitchen gabapentin (NEURONTIN) 300 MG capsule Take 600 mg by mouth at bedtime.   . Lancets (ONETOUCH DELICA PLUS [id number]) MISC   . lidocaine (ASPERCREME W/LIDOCAINE) 4 % cream Apply 1 application topically daily as needed (for joint pain).   . magnesium gluconate (MAGONATE) 500 MG tablet Take 500 mg by mouth at bedtime.  . metFORMIN (GLUCOPHAGE-XR) 500 MG 24 hr tablet Take 1,000 mg by mouth 2 (two) times daily.   . methocarbamol (ROBAXIN) 500 MG tablet Take 1 tablet (500 mg total) by mouth every 6 (six) hours as needed for muscle spasms. (Patient taking differently: Take 500 mg by mouth at bedtime. )  . Misc Natural Products (JOINT HEALTH) CAPS Take 300 mg by mouth daily. Arthrocen 300 mg Supplement  . ONETOUCH VERIO test strip   . Oxymetazoline HCl (NASAL SPRAY NA) Place 1-2  sprays into both nostrils at bedtime as needed (for allergies).  . pantoprazole (PROTONIX) 40 MG tablet Take 40 mg by mouth 2 (two) times daily.   . pioglitazone (ACTOS) 15 MG tablet Take 15 mg by mouth daily.  . quinapril (ACCUPRIL) 40 MG tablet Take 40 mg by mouth daily.  . rosuvastatin (CRESTOR) 20 MG tablet TAKE 1 TABLET BY MOUTH EVERY DAY (Patient taking differently: Take 20 mg by mouth daily. )     Allergies:   Ciprofloxacin hcl; Percocet [oxycodone-acetaminophen]; Penicillins; Sulfa antibiotics; Tape; and Tetanus toxoids   Social History   Tobacco Use  . Smoking status: Former Smoker    Packs/day: 1.00    Years: 10.00    Pack years: 10.00    Types: Cigarettes    Last attempt to quit: 09/22/1982    Years since quitting: 36.4  . Smokeless tobacco: Never Used  . Tobacco comment: quit 1983  Substance Use Topics  . Alcohol use: No  . Drug use: No     Family Hx: The patient's family history includes Breast cancer in her cousin and paternal aunt; Cancer in her father, maternal aunt, and maternal grandfather; Colon cancer in her paternal aunt; Diabetes in her father, maternal grandfather, and maternal grandmother; Heart disease in her father, maternal grandfather, and maternal grandmother; Hypertension in her father and mother; Parkinson's disease in her mother; Prostate cancer in her paternal uncle and another family member; Stroke in her paternal grandfather and paternal grandmother.  ROS:   Please see the history of present illness.    All other systems reviewed and are negative.   Prior CV studies:   The following studies were reviewed today:  Lexiscan Myoview 09/18/18:  The left ventricular ejection fraction is hyperdynamic (>65%).  Nuclear stress EF: 77%.  There was no ST segment deviation noted during stress.  The study is normal.  This is a low risk study.   Normal pharmacologic nuclear stress test with no evidence for prior infarct or ischemia.   Lexiscan  Myoview 11/19/16: Nuclear stress EF: 72%.  The left ventricular ejection fraction is hyperdynamic (>65%).  There  was no ST segment deviation noted during stress.  This is a low risk study. No perfusion defects.   Labs/Other Tests and Data Reviewed:    EKG:  No ECG reviewed.  Recent Labs: 11/27/2018: ALT 22; BUN 13; Creatinine, Ser 1.07; Hemoglobin 12.6; Platelets 289; Potassium 4.9; Sodium 141   Recent Lipid Panel No results found for: CHOL, TRIG, HDL, CHOLHDL, LDLCALC, LDLDIRECT  Wt Readings from Last 3 Encounters:  02/16/19 218 lb (98.9 kg)  02/13/19 218 lb 14.4 oz (99.3 kg)  12/12/18 218 lb 4 oz (99 kg)     Objective:   BP 109/73   Pulse 83   Ht 5' 4.5" (1.638 m)   Wt 218 lb (98.9 kg)   BMI 36.84 kg/m  GENERAL: Well-appearing.  No acute distress. HEENT: Pupils equal round.  Oral mucosa unremarkable NECK:  No jugular venous distention, no visible thyromegaly EXT:  No edema, no cyanosis no clubbing SKIN:  No rashes no nodules NEURO:  Speech fluent.  Cranial nerves grossly intact.  Moves all 4 extremities freely PSYCH:  Cognitively intact, oriented to person place and time   ASSESSMENT & PLAN:    # Chest pain:  # Coronary calcification:  Chest pain resolved.  Lexiscan Myoview was negative for ischemia 09/2018.  Continue aspirin and rosuvastatin.    # Hypertension:  Blood pressure is well-controlled.  She will monitor closely as anastrazole can raise BP.  Continue amlodipine and qunapril.  # Hyperlipidemia: Continue rosuvastatin. LDL was 43 on 08/2018.   COVID-19 Education: The signs and symptoms of COVID-19 were discussed with the patient and how to seek care for testing (follow up with PCP or arrange E-visit).  The importance of social distancing was discussed today.  Time:   Today, I have spent 21 minutes with the patient with telehealth technology discussing the above problems.     Medication Adjustments/Labs and Tests Ordered: Current medicines are  reviewed at length with the patient today.  Concerns regarding medicines are outlined above.   Tests Ordered: No orders of the defined types were placed in this encounter.   Medication Changes: No orders of the defined types were placed in this encounter.   Disposition:  Follow up in 1 year(s)  Signed, Skeet Latch, MD  02/16/2019 5:06 PM    [place]

## 2019-02-19 ENCOUNTER — Encounter: Payer: Self-pay | Admitting: Genetic Counselor

## 2019-02-20 ENCOUNTER — Telehealth: Payer: Self-pay

## 2019-02-20 NOTE — Telephone Encounter (Signed)
LEFT VM FOR PT TO RETURN CALL TO CHANGE APPT TO VIRTUAL AND TO CHANGE THE TIME TO 9:30 AM WITH DR [facility]

## 2019-02-22 NOTE — Progress Notes (Signed)
  Radiation Oncology         [phone] ________________________________  Name: Lauren [name] MRN: [id number]  Date: 01/29/2019  DOB: October 06, 1946  End of Treatment Note  Diagnosis:   73 y.o. 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 This was delivered using a 3-D conformal technique. The patient then received a boost. This delivered an additional 8 Gy in 4 fractions using a 3 field photon technique. The total dose was 50.56 Gy.  Narrative: The patient tolerated radiation treatment relatively well.   The patient had some expected skin irritation with mild erythema as she progressed during treatment. Moist desquamation was not present at the end of treatment. She continues to use Radiaplex gel as directed. She also noted increased fatigue.  Plan: The patient has completed radiation treatment. The patient will return to radiation oncology clinic for routine followup in one month. I advised the patient to call or return sooner if they have any questions or concerns related to their recovery or treatment. ________________________________  Jodelle Gross, MD, PhD  This document serves as a record of services personally performed by Kyung Rudd, MD. It was created on his behalf by Rae Lips, a trained medical scribe. The creation of this record is based on the scribe's personal observations and the provider's statements to them. This document has been checked and approved by the attending provider.

## 2019-02-26 ENCOUNTER — Ambulatory Visit (INDEPENDENT_AMBULATORY_CARE_PROVIDER_SITE_OTHER): Payer: PPO | Admitting: Orthopaedic Surgery

## 2019-02-27 DIAGNOSIS — E1165 Type 2 diabetes mellitus with hyperglycemia: Secondary | ICD-10-CM | POA: Diagnosis not present

## 2019-02-27 DIAGNOSIS — I10 Essential (primary) hypertension: Secondary | ICD-10-CM | POA: Diagnosis not present

## 2019-02-27 DIAGNOSIS — E1169 Type 2 diabetes mellitus with other specified complication: Secondary | ICD-10-CM | POA: Diagnosis not present

## 2019-02-27 DIAGNOSIS — C50919 Malignant neoplasm of unspecified site of unspecified female breast: Secondary | ICD-10-CM | POA: Diagnosis not present

## 2019-02-28 ENCOUNTER — Telehealth: Payer: Self-pay | Admitting: Radiation Oncology

## 2019-02-28 DIAGNOSIS — M47816 Spondylosis without myelopathy or radiculopathy, lumbar region: Secondary | ICD-10-CM | POA: Diagnosis not present

## 2019-02-28 DIAGNOSIS — Z79899 Other long term (current) drug therapy: Secondary | ICD-10-CM | POA: Diagnosis not present

## 2019-02-28 DIAGNOSIS — M150 Primary generalized (osteo)arthritis: Secondary | ICD-10-CM | POA: Diagnosis not present

## 2019-02-28 DIAGNOSIS — G894 Chronic pain syndrome: Secondary | ICD-10-CM | POA: Diagnosis not present

## 2019-02-28 NOTE — Telephone Encounter (Signed)
  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 This was delivered using a 3-D conformal technique. The patient then received a boost. This delivered an additional 8 Gy in 4 fractions using a 3 field photon technique. The total dose was 50.56 Gy.  Narrative:  The patient was contacted today for routine follow-up. During treatment she did very well with radiotherapy and did not have significant desquamation. She reports she is doing very well and reports her skin is nearly back to normal color. She's feeling pretty well overall with only mild fatigue which she notes has improved. No other complaints are noted.  Impression/Plan: 1. Stage IA, cT1cN0M0 ER/PR positive invasive ductal carcinoma of the right breast. The patient has been doing well since completion of radiotherapy. We discussed that we would be happy to continue to follow her as needed, but she will also continue to follow up with Dr. Jana Hakim in medical oncology. She was counseled on skin care as well as measures to avoid sun exposure to this area.  2. Survivorship. We discussed the importance of survivorship evaluation and was given the phone number for Ottis Stain [phone]) to be added to the list to receive the monthly resource calendar for the cancer center.     Carola Rhine, PAC

## 2019-03-01 ENCOUNTER — Ambulatory Visit: Payer: PPO | Admitting: Cardiovascular Disease

## 2019-03-06 ENCOUNTER — Other Ambulatory Visit: Payer: Self-pay

## 2019-03-06 ENCOUNTER — Ambulatory Visit (INDEPENDENT_AMBULATORY_CARE_PROVIDER_SITE_OTHER): Payer: PPO | Admitting: Orthopaedic Surgery

## 2019-03-06 ENCOUNTER — Encounter: Payer: Self-pay | Admitting: Orthopaedic Surgery

## 2019-03-06 DIAGNOSIS — M1711 Unilateral primary osteoarthritis, right knee: Secondary | ICD-10-CM | POA: Diagnosis not present

## 2019-03-06 MED ORDER — HYALURONAN 88 MG/4ML IX SOSY
88.0000 mg | PREFILLED_SYRINGE | INTRA_ARTICULAR | Status: AC | PRN
  Administered 2019-03-06: 88 mg via INTRA_ARTICULAR

## 2019-03-06 MED ORDER — LIDOCAINE HCL 1 % IJ SOLN
0.5000 mL | INTRAMUSCULAR | Status: AC | PRN
  Administered 2019-03-06: .5 mL

## 2019-03-06 NOTE — Progress Notes (Signed)
   Procedure Note  Patient: Lauren [name]             Date of Birth: [birth year]           MRN: [id number]             Visit Date: 03/06/2019  HPI: Mrs. Lauren [name] comes in today for Monovisc injection right knee.  She continues to have significant pain in the right knee but no mechanical symptoms.  Known tricompartmental osteoarthritis right knee.  No new injury.  Physical exam: Full extension right knee.  Flexion beyond 90 degrees.  Tenderness along the medial joint line.  Mild effusion.  No abnormal warmth erythema or ecchymosis.  Procedures: Visit Diagnoses: Unilateral primary osteoarthritis, right knee  Large Joint Inj: R knee on 03/06/2019 8:57 AM Indications: pain Details: 22 G 1.5 in needle, anterolateral approach  Arthrogram: No  Medications: 88 mg Hyaluronan 88 MG/4ML; 0.5 mL lidocaine 1 % Aspirate: 3 mL yellow Outcome: tolerated well, no immediate complications Procedure, treatment alternatives, risks and benefits explained, specific risks discussed. Consent was given by the patient. Immediately prior to procedure a time out was called to verify the correct patient, procedure, equipment, support staff and site/side marked as required. Patient was prepped and draped in the usual sterile fashion.     Plan: We will see her back on an as-needed basis.  She understands she needs to wait 3 months between cortisone injections in 6 months between supplemental injections.  Questions were encouraged and answered at length.

## 2019-03-08 DIAGNOSIS — E1169 Type 2 diabetes mellitus with other specified complication: Secondary | ICD-10-CM | POA: Diagnosis not present

## 2019-03-08 DIAGNOSIS — I700 Atherosclerosis of aorta: Secondary | ICD-10-CM | POA: Diagnosis not present

## 2019-03-21 DIAGNOSIS — C50919 Malignant neoplasm of unspecified site of unspecified female breast: Secondary | ICD-10-CM | POA: Diagnosis not present

## 2019-03-21 DIAGNOSIS — I10 Essential (primary) hypertension: Secondary | ICD-10-CM | POA: Diagnosis not present

## 2019-03-21 DIAGNOSIS — E1169 Type 2 diabetes mellitus with other specified complication: Secondary | ICD-10-CM | POA: Diagnosis not present

## 2019-03-22 DIAGNOSIS — Z6838 Body mass index (BMI) 38.0-38.9, adult: Secondary | ICD-10-CM | POA: Diagnosis not present

## 2019-04-15 ENCOUNTER — Other Ambulatory Visit: Payer: Self-pay | Admitting: Oncology

## 2019-04-16 DIAGNOSIS — H524 Presbyopia: Secondary | ICD-10-CM | POA: Diagnosis not present

## 2019-04-16 DIAGNOSIS — H353121 Nonexudative age-related macular degeneration, left eye, early dry stage: Secondary | ICD-10-CM | POA: Diagnosis not present

## 2019-04-16 DIAGNOSIS — E119 Type 2 diabetes mellitus without complications: Secondary | ICD-10-CM | POA: Diagnosis not present

## 2019-04-16 DIAGNOSIS — H353111 Nonexudative age-related macular degeneration, right eye, early dry stage: Secondary | ICD-10-CM | POA: Diagnosis not present

## 2019-04-16 DIAGNOSIS — H5213 Myopia, bilateral: Secondary | ICD-10-CM | POA: Diagnosis not present

## 2019-04-20 DIAGNOSIS — E1169 Type 2 diabetes mellitus with other specified complication: Secondary | ICD-10-CM | POA: Diagnosis not present

## 2019-04-20 DIAGNOSIS — I10 Essential (primary) hypertension: Secondary | ICD-10-CM | POA: Diagnosis not present

## 2019-04-20 DIAGNOSIS — E7800 Pure hypercholesterolemia, unspecified: Secondary | ICD-10-CM | POA: Diagnosis not present

## 2019-04-20 DIAGNOSIS — C50919 Malignant neoplasm of unspecified site of unspecified female breast: Secondary | ICD-10-CM | POA: Diagnosis not present

## 2019-04-25 DIAGNOSIS — Z79899 Other long term (current) drug therapy: Secondary | ICD-10-CM | POA: Diagnosis not present

## 2019-04-25 DIAGNOSIS — G894 Chronic pain syndrome: Secondary | ICD-10-CM | POA: Diagnosis not present

## 2019-04-25 DIAGNOSIS — M150 Primary generalized (osteo)arthritis: Secondary | ICD-10-CM | POA: Diagnosis not present

## 2019-04-25 DIAGNOSIS — M47816 Spondylosis without myelopathy or radiculopathy, lumbar region: Secondary | ICD-10-CM | POA: Diagnosis not present

## 2019-05-01 ENCOUNTER — Telehealth: Payer: Self-pay | Admitting: Oncology

## 2019-05-01 NOTE — Telephone Encounter (Signed)
[facility] PAL moved 7/20 f/u to 7/31 as webex. Left message. Schedule mailed.

## 2019-05-16 DIAGNOSIS — I10 Essential (primary) hypertension: Secondary | ICD-10-CM | POA: Diagnosis not present

## 2019-05-16 DIAGNOSIS — E7800 Pure hypercholesterolemia, unspecified: Secondary | ICD-10-CM | POA: Diagnosis not present

## 2019-05-16 DIAGNOSIS — E1169 Type 2 diabetes mellitus with other specified complication: Secondary | ICD-10-CM | POA: Diagnosis not present

## 2019-05-16 DIAGNOSIS — C50919 Malignant neoplasm of unspecified site of unspecified female breast: Secondary | ICD-10-CM | POA: Diagnosis not present

## 2019-05-21 ENCOUNTER — Ambulatory Visit: Payer: PPO | Admitting: Oncology

## 2019-05-21 ENCOUNTER — Other Ambulatory Visit: Payer: PPO

## 2019-05-29 ENCOUNTER — Ambulatory Visit: Payer: Self-pay

## 2019-05-29 ENCOUNTER — Encounter: Payer: Self-pay | Admitting: Physical Medicine and Rehabilitation

## 2019-05-29 ENCOUNTER — Ambulatory Visit (INDEPENDENT_AMBULATORY_CARE_PROVIDER_SITE_OTHER): Payer: PPO | Admitting: Physical Medicine and Rehabilitation

## 2019-05-29 VITALS — BP 114/68 | HR 79

## 2019-05-29 DIAGNOSIS — M461 Sacroiliitis, not elsewhere classified: Secondary | ICD-10-CM | POA: Diagnosis not present

## 2019-05-29 NOTE — Progress Notes (Signed)
Right buttock pain. Pain is worse when going up steps or sleeping on right side. Also sometimes has lateral right foot pain. Sometimes pain on bottom of foot under toes.  Numeric Pain Rating Scale and Functional Assessment Average Pain 4 Pain Right Now 3 My pain is constant, dull and aching Pain is worse with: sitting and lying on right side Pain improves with: elevating feet   In the last MONTH (on 0-10 scale) has pain interfered with the following?  1. General activity like being  able to carry out your everyday physical activities such as walking, climbing stairs, carrying groceries, or moving a chair?  Rating(3)  2. Relation with others like being able to carry out your usual social activities and roles such as  activities at home, at work and in your community. Rating(3)  3. Enjoyment of life such that you have  been bothered by emotional problems such as feeling anxious, depressed or irritable?  Rating(5)

## 2019-05-31 ENCOUNTER — Telehealth: Payer: Self-pay | Admitting: Oncology

## 2019-05-31 NOTE — Progress Notes (Signed)
[place]  [phone] [phone]     ID: Lauren [name] DOB: 1946-04-26  MR#: [id number]  TSV#:[id number]  Patient Care Team: Lawerance Cruel, MD as PCP - General (Family Medicine) Skeet Latch, MD as PCP - Cardiology (Cardiology) Mcarthur Rossetti, MD as Consulting Physician (Orthopedic Surgery) Stephine Langbehn, Virgie Dad, MD as Consulting Physician (Oncology) Kyung Rudd, MD as Consulting Physician (Radiation Oncology) Huel Cote, NP as Nurse Practitioner (Obstetrics and Gynecology) Clarene Essex, MD as Consulting Physician (Gastroenterology) Coralie Keens, MD as Consulting Physician (General Surgery) Mauro Kaufmann, RN as Oncology Nurse Navigator Rockwell Germany, RN as Oncology Nurse Navigator OTHER MD:   CHIEF COMPLAINT: Estrogen receptor positive breast cancer  CURRENT TREATMENT: Anastrozole   I connected with Lauren [name] on 06/01/19 at 11:15 AM EDT by telephone visit and verified that I am speaking with the correct person using two identifiers.   I discussed the limitations, risks, security and privacy concerns of performing an evaluation and management service by telemedicine and the availability of in-person appointments. I also discussed with the patient that there may be a patient responsible charge related to this service. The patient expressed understanding and agreed to proceed.   Other persons participating in the visit and their role in the encounter:   - Biomedical scientist, Medical Scribe   Patients location: home  Providers location: [place]: From the original intake note:  Lauren [name]" had routine screening mammography on 10/04/2018 showing a possible abnormality in the right breast. She underwent unilateral right diagnostic mammography with tomography and right breast ultrasonography at [facility] on 10/12/2018 showing: Breast Density Category C. Spot  compression tomosynthesis images through the upper outer quadrant of the right breast demonstrates a persistent irregular mass with indistinct margins measuring approximately 1 cm. On physical exam, no definite palpable mass is identified in the upper-outer right breast. Targeted ultrasound is performed, showing an irregular hypoechoic shadowing mass at 10:30 oclock, 6 cm from the nipple measuring 9 x 8 x 9 mm. Ultrasound of the right axilla demonstrates multiple normal-appearing lymph nodes.  Accordingly on 10/18/2018 she proceeded to biopsy of the right breast area in question. The pathology from this procedure showed ([id number]): invasive ductal carcinoma, grade II. Prognostic indicators significant for: estrogen receptor, 100% positive and progesterone receptor, 20% positive, both with strong staining intensity. Proliferation marker Ki67 at 5%. HER2 equivocal (2+) by immunohistochemisty but negative by fluorescent in situ hybridization with a signals ratio 1.26 and number per cell 2.40.   The patient's subsequent history is as detailed below.   INTERVAL HISTORY: Lauren [name] is contacted today for follow-up and treatment of her estrogen receptor positive breast cancer.  She continues on anastrozole. She does have hot flashes, at least several a day with diaphoresis; they do wake her up at night.   Taleeyah's last bone density screening on 01/06/2012, showed a classification of normal.    Since her last visit here, she has not undergone any additional studies.    REVIEW OF SYSTEMS: Lauren [name] notes a hard place on her breast where her surgery was, which she assumes is scar tissue. She does note some mood changes. She will be celebrating 20 years of marriage this year.She has noticed some more arthralgia than usual. A detailed review of systems was otherwise noncontributory.    PAST MEDICAL HISTORY: Past Medical History:  Diagnosis Date   Arthritis    Back, Hip- right  Cancer [facility])    right  breast   Cataract    Diabetes mellitus without complication ([place])    Type II   Endometriosis    Family history of adverse reaction to anesthesia    Father - N/V - blood pressure; daughter N/V   Family history of breast cancer    Family history of colon cancer    Family history of kidney cancer    Family history of prostate cancer    GERD (gastroesophageal reflux disease)    History of kidney stones    History of ulcerative colitis    Hypertension    Insomnia    Lumbar disc disease    L4 L5   Neuropathy    Pneumonia    1983, 2003   PVCs (premature ventricular contractions)    benign   Umbilical hernia    Vitamin D deficiency    Wears glasses      PAST SURGICAL HISTORY: Past Surgical History:  Procedure Laterality Date   APPENDECTOMY     BREAST BIOPSY  08-13-2009  DR TSUEI   EXCISION LEFT NIPPLE DUCT   BREAST EXCISIONAL BIOPSY Left    x2   BREAST EXCISIONAL BIOPSY Right    CHOLECYSTECTOMY  1992   COLON RESECTION  1986   ULCERATIVE COLITIS   COLON SURGERY     ESOPHAGEAL MANOMETRY N/A 06/11/2013   Procedure: ESOPHAGEAL MANOMETRY (EM);  Surgeon: Jeryl Columbia, MD;  Location: [facility] END[facility]COPY;  Service: Endoscopy;  Laterality: N/A;   ESOPHAGOGASTRODUODENOSCOPY (EGD) WITH PROPOFOL N/A 05/31/2016   Procedure: ESOPHAGOGASTRODUODENOSCOPY (EGD) WITH PROPOFOL;  Surgeon: Clarene Essex, MD;  Location: [facility] ENDOSCOPY;  Service: Endoscopy;  Laterality: N/A;   EXCISION RIGHT BREAST MASS   10-20-2005  DR Collier [place] YOUNG   FRACTURE SURGERY     left arm   kidney stone removed  2/11   KNEE ARTHROSCOPY Left    MCL   LEFT THUMB CARPOMETACARPAL JOINT SUSPENSIONPLASTY  04-06-2006  DR Burney Gauze   LEFT WRIST ARTHROSCOPY W/ DEBRIDEMENT AND REMOVAL CYST  03-26-2004  DR Burney Gauze   MYOMECTOMY     RADIOACTIVE SEED GUIDED PARTIAL MASTECTOMY WITH AXILLARY SENTINEL LYMPH NODE BIOPSY Right 11/16/2018   Procedure: RIGHT BREAST RADIOACTIVE SEED GUIDED PARTIAL MASTECTOMY WITH   SENTINEL  NODE BIOPSY;  Surgeon: Coralie Keens, MD;  Location: [facility];  Service: General;  Laterality: Right;   RIGHT COLECTOMY     RIGHT URETEROSCOPIC STONE EXTRACTION  12-11-2009  DR [place] HIP ARTHROPLASTY Right 11/26/2016   Procedure: RIGHT TOTAL HIP ARTHROPLASTY ANTERIOR APPROACH;  Surgeon: Mcarthur Rossetti, MD;  Location: [facility];  Service: Orthopedics;  Laterality: Right;   TOTAL KNEE ARTHROPLASTY Left 12/02/2017   Procedure: LEFT TOTAL KNEE ARTHROPLASTY;  Surgeon: Mcarthur Rossetti, MD;  Location: [facility];  Service: Orthopedics;  Laterality: Left;   WRIST SURGERY     both     FAMILY HISTORY: Family History  Problem Relation Age of Onset   Hypertension Father    Heart disease Father    Diabetes Father    Cancer Father        colon, prostate, and kidney   Hypertension Mother    Parkinson's disease Mother    Heart disease Maternal Grandmother    Diabetes Maternal Grandmother    Heart disease Maternal Grandfather    Diabetes Maternal Grandfather    Cancer Maternal Grandfather        pt  unaware of what kind   Stroke Paternal Grandmother    Stroke Paternal Grandfather    Colon cancer Paternal Aunt        dx less than 2   Cancer Maternal Aunt        cervical vs ovarian   Prostate cancer Paternal Uncle    Prostate cancer Other        PGF's father   Breast cancer Paternal Aunt        dx in her 61s   Breast cancer Cousin        pat first cousin, dx over 24   Cindy's father died from colon cancer at age 71; he was diagnosed with prostate cancer at 39, and he also had kidney cancer. Patients' mother died from failure to thrive at age 26. The patient has no siblings. Lauren [name] had a paternal aunt that was diagnosed with breast cancer in her mid 30's. Lauren [name] also has a paternal great grandfather that was diagnosed with prostate cancer. Patient denies anyone in her family having ovarian cancer.     GYNECOLOGIC HISTORY:  No LMP recorded. Patient is postmenopausal. 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 Her husband, [name], ran a Building surveyor. Together, they have two children, Richard and [employer]illard's. Their son, Delfino Lovett, lives in [place] and is disabled (MS). Their daughter, [name], is a Physiological scientist (and also has MS). Lauren [name] has 5 grandchildren, no great grandchildren. She attends a [employer].    ADVANCED DIRECTIVES: To be discussed   HEALTH MAINTENANCE: Social History   Tobacco Use   Smoking status: Former Smoker    Packs/day: 1.00    Years: 10.00    Pack years: 10.00    Types: Cigarettes    Quit date: 09/22/1982    Years since quitting: 36.7   Smokeless tobacco: Never Used   Tobacco comment: quit 1983  Substance Use Topics   Alcohol use: No   Drug use: No    Colonoscopy: yes, 3 or 4 years ago  PAP: Stopped at 28; Elon Alas, [facility]  Bone density: yes, 2+ years ago with unknown result; Elon Alas, NP - [facility]  Allergies  Allergen Reactions   Ciprofloxacin Hcl Hives   Percocet [Oxycodone-Acetaminophen] Other (See Comments)    RESPIRATORY DISTRESS   Penicillins Hives and Other (See Comments)    Has patient had a PCN reaction causing immediate rash, facial/tongue/throat swelling, SOB or lightheadedness with hypotension: no Has patient had a PCN reaction causing severe rash involving mucus membranes or skin necrosis: no Has patient had a PCN reaction that required hospitalization no Has patient had a PCN reaction occurring within the last 10 years: no If all of the above answers are "NO", then may proceed with Cephalosporin use.   Sulfa Antibiotics Hives    Tape Other (See Comments)    Adhesives - Causes Blisters. No band aids, and plastic tape    Tetanus Toxoids Hives and Rash   Jardiance [Empagliflozin]     Current Outpatient Medications  Medication Sig Dispense Refill   amLODipine (NORVASC) 5 MG tablet TAKE 1 TABLET BY MOUTH ONCE DAILY *NEED  OFFICE  VISIT* (Patient taking differently: Take 5 mg by mouth daily. ) 90 tablet 3  anastrozole (ARIMIDEX) 1 MG tablet Take 1 tablet (1 mg total) by mouth daily. 90 tablet 4   aspirin EC 81 MG tablet Take 81 mg by mouth daily.     b complex vitamins tablet Take 1 tablet by mouth daily.     balsalazide (COLAZAL) 750 MG capsule Take 2,250 mg by mouth 3 (three) times daily.      Cholecalciferol (HM VITAMIN D3) 2000 units CAPS Take 2,000 Units by mouth daily.      cholestyramine (QUESTRAN) 4 GM/DOSE powder Take 4 g by mouth daily.      furosemide (LASIX) 20 MG tablet Take 20 mg by mouth daily.      gabapentin (NEURONTIN) 300 MG capsule Take 600 mg by mouth at bedtime.   1   Lancets (ONETOUCH DELICA PLUS GLOVFI43P) MISC      lidocaine (ASPERCREME W/LIDOCAINE) 4 % cream Apply 1 application topically daily as needed (for joint pain).      magnesium gluconate (MAGONATE) 500 MG tablet Take 500 mg by mouth at bedtime.     metFORMIN (GLUCOPHAGE-XR) 500 MG 24 hr tablet Take 1,000 mg by mouth 2 (two) times daily.      methocarbamol (ROBAXIN) 500 MG tablet Take 1 tablet (500 mg total) by mouth every 6 (six) hours as needed for muscle spasms. (Patient taking differently: Take 500 mg by mouth at bedtime. ) 60 tablet 0   Misc Natural Products (JOINT HEALTH) CAPS Take 300 mg by mouth daily. Arthrocen 300 mg Supplement     ONETOUCH VERIO test strip      Oxymetazoline HCl (NASAL SPRAY NA) Place 1-2 sprays into both nostrils at bedtime as needed (for allergies).     pantoprazole (PROTONIX) 40 MG tablet Take 40 mg by mouth 2 (two) times daily.      pioglitazone (ACTOS) 15 MG tablet Take 15 mg by mouth  daily.     quinapril (ACCUPRIL) 40 MG tablet Take 40 mg by mouth daily.     rosuvastatin (CRESTOR) 20 MG tablet TAKE 1 TABLET BY MOUTH EVERY DAY (Patient taking differently: Take 20 mg by mouth daily. ) 90 tablet 1   venlafaxine (EFFEXOR) 37.5 MG tablet Take 1 tablet (37.5 mg total) by mouth 2 (two) times daily. 90 tablet 4   No current facility-administered medications for this visit.      OBJECTIVE: Middle-aged white woman who appears stated age  There were no vitals filed for this visit.   There is no height or weight on file to calculate BMI.   Wt Readings from Last 3 Encounters:  02/16/19 218 lb (98.9 kg)  02/13/19 218 lb 14.4 oz (99.3 kg)  12/12/18 218 lb 4 oz (99 kg)      ECOG FS:1 - Symptomatic but completely ambulatory  Telehealth Visit   LAB RESULTS:  CMP     Component Value Date/Time   NA 141 11/27/2018 1505   K 4.9 11/27/2018 1505   CL 104 11/27/2018 1505   CO2 28 11/27/2018 1505   GLUCOSE 247 (H) 11/27/2018 1505   BUN 13 11/27/2018 1505   CREATININE 1.07 (H) 11/27/2018 1505   CREATININE 0.96 11/09/2018 1405   CALCIUM 9.6 11/27/2018 1505   PROT 7.1 11/27/2018 1505   ALBUMIN 3.7 11/27/2018 1505   AST 31 11/27/2018 1505   AST 34 11/09/2018 1405   ALT 22 11/27/2018 1505   ALT 24 11/09/2018 1405   ALKPHOS 68 11/27/2018 1505   BILITOT 0.3 11/27/2018 1505   BILITOT 0.4 11/09/2018  [place] (L) 11/27/2018 1505   GFRNONAA 59 (L) 11/09/2018 1405   GFRAA 60 (L) 11/27/2018 1505   GFRAA >60 11/09/2018 1405    No results found for: TOTALPROTELP, ALBUMINELP, A1GS, A2GS, BETS, BETA2SER, GAMS, MSPIKE, SPEI  No results found for: KPAFRELGTCHN, LAMBDASER, KAPLAMBRATIO  Lab Results  Component Value Date   WBC 9.3 11/27/2018   NEUTROABS 5.8 11/27/2018   HGB 12.6 11/27/2018   HCT 40.5 11/27/2018   MCV 88.8 11/27/2018   PLT 289 11/27/2018    @LASTCHEMISTRY @  No results found for: LABCA2  No components found for: [id number]  No results for  input(s): INR in the last 168 hours.  No results found for: LABCA2  No results found for: ESP233  No results found for: AQT622  No results found for: QJF354  No results found for: CA2729  No components found for: HGQUANT  No results found for: CEA1 / No results found for: CEA1   No results found for: AFPTUMOR  No results found for: CHROMOGRNA  No results found for: PSA1  No visits with results within 3 Day(s) from this visit.  Latest known visit with results is:  Appointment on 11/27/2018  Component Date Value Ref Range Status   Sodium 11/27/2018 141  135 - 145 mmol/L Final   Potassium 11/27/2018 4.9  3.5 - 5.1 mmol/L Final   Chloride 11/27/2018 104  98 - 111 mmol/L Final   CO2 11/27/2018 28  22 - 32 mmol/L Final   Glucose, Bld 11/27/2018 247* 70 - 99 mg/dL Final   BUN 11/27/2018 13  8 - 23 mg/dL Final   Creatinine, Ser 11/27/2018 1.07* 0.44 - 1.00 mg/dL Final   Calcium 11/27/2018 9.6  8.9 - 10.3 mg/dL Final   Total Protein 11/27/2018 7.1  6.5 - 8.1 g/dL Final   Albumin 11/27/2018 3.7  3.5 - 5.0 g/dL Final   AST 11/27/2018 31  15 - 41 U/L Final   ALT 11/27/2018 22  0 - 44 U/L Final   Alkaline Phosphatase 11/27/2018 68  38 - 126 U/L Final   Total Bilirubin 11/27/2018 0.3  0.3 - 1.2 mg/dL Final   GFR calc non Af Amer 11/27/2018 51* >60 mL/min Final   GFR calc Af Amer 11/27/2018 60* >60 mL/min Final   Anion gap 11/27/2018 9  5 - 15 Final   Performed at [facility], [place] [address] [place], [place] [address]   WBC 11/27/2018 9.3  4.0 - 10.5 K/uL Final   RBC 11/27/2018 4.56  3.87 - 5.11 MIL/uL Final   Hemoglobin 11/27/2018 12.6  12.0 - 15.0 g/dL Final   HCT 11/27/2018 40.5  36.0 - 46.0 % Final   MCV 11/27/2018 88.8  80.0 - 100.0 fL Final   MCH 11/27/2018 27.6  26.0 - 34.0 pg Final   MCHC 11/27/2018 31.1  30.0 - 36.0 g/dL Final   RDW 11/27/2018 14.3  11.5 - 15.5 % Final   Platelets 11/27/2018 289  150 - 400 K/uL Final     nRBC 11/27/2018 0.0  0.0 - 0.2 % Final   Neutrophils Relative % 11/27/2018 63  % Final   Neutro Abs 11/27/2018 5.8  1.7 - 7.7 K/uL Final   Lymphocytes Relative 11/27/2018 21  % Final   Lymphs Abs 11/27/2018 2.0  0.7 - 4.0 K/uL Final   Monocytes Relative 11/27/2018 10  % Final   Monocytes Absolute 11/27/2018 0.9  0.1 - 1.0 K/uL Final   Eosinophils Relative  11/27/2018 5  % Final   Eosinophils Absolute 11/27/2018 0.5  0.0 - 0.5 K/uL Final   Basophils Relative 11/27/2018 1  % Final   Basophils Absolute 11/27/2018 0.1  0.0 - 0.1 K/uL Final   Immature Granulocytes 11/27/2018 0  % Final   Abs Immature Granulocytes 11/27/2018 0.04  0.00 - 0.07 K/uL Final   Performed at [facility], [address] [place], [place] [address]    (this displays the last labs from the last 3 days)  No results found for: TOTALPROTELP, ALBUMINELP, A1GS, A2GS, BETS, BETA2SER, GAMS, MSPIKE, SPEI (this displays SPEP labs)  No results found for: KPAFRELGTCHN, LAMBDASER, KAPLAMBRATIO (kappa/lambda light chains)  No results found for: HGBA, HGBA2QUANT, HGBFQUANT, HGBSQUAN (Hemoglobinopathy evaluation)   No results found for: LDH  No results found for: IRON, TIBC, IRONPCTSAT (Iron and TIBC)  No results found for: FERRITIN  Urinalysis    Component Value Date/Time   COLORURINE YELLOW 09/19/2018 [place] 09/19/2018 1143   LABSPEC 1.010 09/19/2018 1143   PHURINE 6.0 09/19/2018 1143   GLUCOSEU 3+ (A) 09/19/2018 1143   HGBUR TRACE (A) 09/19/2018 1143   BILIRUBINUR NEGATIVE 11/05/2015 [place] 09/19/2018 1143   PROTEINUR NEGATIVE 09/19/2018 1143   UROBILINOGEN 0.2 11/28/2013 1456   NITRITE NEGATIVE 11/05/2015 1512   LEUKOCYTESUR 1+ (A) 11/05/2015 1512     STUDIES:  Xr C-arm No Report  Result Date: 05/29/2019 Please see Notes tab for imaging impression.    ELIGIBLE FOR AVAILABLE RESEARCH PROTOCOL: no   ASSESSMENT: 73 y.o.  [place], [place] woman status post right breast upper outer quadrant biopsy 10/18/2018 for a clinical T1b N0, stage IA invasive ductal carcinoma, grade 2, estrogen and progesterone receptor positive, MIB-1 5%, with no HER-2 amplification by FISH  (1) definitive surgery 11/16/2018  (a) Oncotype also read tumor is HER-2 negative  (2) Oncotype score of 12 predicted a risk of recurrence outside the breast over the next 9 years of only 3% if the patient's only systemic treatment was antiestrogens for 5 years.  No significant benefit was anticipated from chemotherapy.  (3) adjuvant radiation 01/02/2019 to 01/29/2019.  (4) anastrozole started 02/13/2019  (a) bone density 01/06/2012 was normal  (5) genetics testing 11/27/2018 through the Multi-Gene Panel offered by [facility] found no deleterious mutations in AIP, ALK, APC, ATM, AXIN2,BAP1,  BARD1, BLM, BMPR1A, BRCA1, BRCA2, BRIP1, CASR, CDC73, CDH1, CDK4, CDKN1B, CDKN1C, CDKN2A (p14ARF), CDKN2A (p16INK4a), CEBPA, CHEK2, CTNNA1, DICER1, DIS3L2, EGFR (c.2369C>T, p.Thr790Met variant only), EPCAM (Deletion/duplication testing only), FH, FLCN, GATA2, GPC3, GREM1 (Promoter region deletion/duplication testing only), HOXB13 (c.251G>A, p.Gly84Glu), HRAS, KIT, MAX, MEN1, MET, MITF (c.952G>A, p.Glu318Lys variant only), MLH1, MSH2, MSH3, MSH6, MUTYH, NBN, NF1, NF2, NTHL1, PALB2, PDGFRA, PHOX2B, PMS2, POLD1, POLE, POT1, PRKAR1A, PTCH1, PTEN, RAD50, RAD51C, RAD51D, RB1, RECQL4, RET, RUNX1, SDHAF2, SDHA (sequence changes only), SDHB, SDHC, SDHD, SMAD4, SMARCA4, SMARCB1, SMARCE1, STK11, SUFU, TERC, TERT, TMEM127, TP53, TSC1, TSC2, VHL, WRN and WT1.    (a) a variant of uncertain significant was found in DICER1, c.2118C>T (silent)  PLAN: Lauren [name] is having some of the usual symptoms of posttraumatic stress that we note in our patients.  Unfortunately we do not have the finding your new normal meetings available.  We discussed all this in detail and she understands that feeling a  little irritable is not uncommon at this time.  Her husband is having mild dementia issues.  She does have a "36-hour day" book and she is looking into that.  I am impressed though as I listen to them over the phone that they have an unusually opening communication and that do seem to help each other quite a bit through their separate journeys.  I have recommended tai chi for the arthralgias that she is experiencing.  I am adding venlafaxine very low-dose to see if that helps with the hot flashes.  We can always increase the dose later if necessary.  She will have her next mammogram and December and I will add a bone density to that.  She will see me again April of next year.  She knows to call for any other issues that may develop before then.  Hyatt Capobianco, Virgie Dad, MD  06/01/19 11:42 AM Medical Oncology and Hematology [facility] [address] [place], [place] [address] Tel. [phone]    Fax. [phone]   I, Jacqualyn Posey am acting as a Education administrator for Chauncey Cruel, MD.   I, Lurline Del MD, have reviewed the above documentation for accuracy and completeness, and I agree with the above.

## 2019-05-31 NOTE — Telephone Encounter (Signed)
Called patient regarding upcoming Webex appointment, patient does not have access to do virtual visit and needs this to be a telephone visit.

## 2019-06-01 ENCOUNTER — Inpatient Hospital Stay: Payer: PPO | Attending: Oncology | Admitting: Oncology

## 2019-06-01 DIAGNOSIS — Z79811 Long term (current) use of aromatase inhibitors: Secondary | ICD-10-CM | POA: Diagnosis not present

## 2019-06-01 DIAGNOSIS — Z170 Estrogen receptor positive status [ER+]: Secondary | ICD-10-CM

## 2019-06-01 DIAGNOSIS — C50411 Malignant neoplasm of upper-outer quadrant of right female breast: Secondary | ICD-10-CM

## 2019-06-01 MED ORDER — VENLAFAXINE HCL 37.5 MG PO TABS: 37.5000 mg | ORAL_TABLET | Freq: Two times a day (BID) | ORAL | 4 refills | Status: DC

## 2019-06-04 ENCOUNTER — Encounter: Payer: Self-pay | Admitting: *Deleted

## 2019-06-04 ENCOUNTER — Telehealth: Payer: Self-pay | Admitting: Adult Health

## 2019-06-04 NOTE — Telephone Encounter (Signed)
Scheduled appt per 8/03 sch message - unable to reach pt - mailed letter with appt date and time

## 2019-06-07 DIAGNOSIS — M47816 Spondylosis without myelopathy or radiculopathy, lumbar region: Secondary | ICD-10-CM | POA: Diagnosis not present

## 2019-06-07 DIAGNOSIS — Z79899 Other long term (current) drug therapy: Secondary | ICD-10-CM | POA: Diagnosis not present

## 2019-06-07 DIAGNOSIS — M150 Primary generalized (osteo)arthritis: Secondary | ICD-10-CM | POA: Diagnosis not present

## 2019-06-07 DIAGNOSIS — G894 Chronic pain syndrome: Secondary | ICD-10-CM | POA: Diagnosis not present

## 2019-06-11 ENCOUNTER — Telehealth: Payer: Self-pay

## 2019-06-11 NOTE — Telephone Encounter (Signed)
Pt called stating she was prescribed Effexor for hot flashes and thought she was told to take once a day but prescription bottle says take 2 times a day. Pt states she first tried taking it 2 times a day and had really bad nausea.  She then cut back to once a day for the next 3 days and continued to have nausea but states that it was effective in controlling hot flashes and even helped alleviate some irritability. This RN instructed pt to try taking 1/2 a pill in the morning and 1/2 a pill in the evening, both doses with food.  Try this for 2 days then call us back and let us know if nausea persist or if this dose does not help with hot flashes.  Pt verbalizes understanding.

## 2019-06-26 DIAGNOSIS — C50919 Malignant neoplasm of unspecified site of unspecified female breast: Secondary | ICD-10-CM | POA: Diagnosis not present

## 2019-06-26 DIAGNOSIS — E1169 Type 2 diabetes mellitus with other specified complication: Secondary | ICD-10-CM | POA: Diagnosis not present

## 2019-06-26 DIAGNOSIS — I10 Essential (primary) hypertension: Secondary | ICD-10-CM | POA: Diagnosis not present

## 2019-06-26 DIAGNOSIS — E7800 Pure hypercholesterolemia, unspecified: Secondary | ICD-10-CM | POA: Diagnosis not present

## 2019-06-29 ENCOUNTER — Telehealth: Payer: Self-pay | Admitting: *Deleted

## 2019-06-29 NOTE — Telephone Encounter (Signed)
This RN spoke with pt per her call stating she is taking the venlafaxine 37.5mg  1/2 tab twice a day- decreased from 1 tab bid due to nausea- " which at first seemed to help- but now the nausea is back and I would rather have the hot flashes then the nausea "  This RN informed pt to take 1/2 daily x 1 week and then may stop.  Pt verbalized understanding of recommendation.

## 2019-07-08 ENCOUNTER — Other Ambulatory Visit: Payer: Self-pay | Admitting: Oncology

## 2019-07-17 ENCOUNTER — Telehealth: Payer: Self-pay | Admitting: *Deleted

## 2019-07-17 DIAGNOSIS — Z23 Encounter for immunization: Secondary | ICD-10-CM | POA: Diagnosis not present

## 2019-07-17 NOTE — Telephone Encounter (Signed)
Pt called to this RN to state she has gone off the venlafaxine per prior call due to ongoing nausea- since stopping the medication - she is not nauseated.  She states hot flashes have returned as well as noted mood changes.  She states " I liked that the venlafaxine helped the hot flashes and helped me feel calmer but the nausea was very unrelenting.  She is inquiring what else she can take.  Of note pt is currently on 600 mg neurontin qhs which helps with sleep but not hot flashes.  Pt is on anastrazole.

## 2019-07-18 DIAGNOSIS — I10 Essential (primary) hypertension: Secondary | ICD-10-CM | POA: Diagnosis not present

## 2019-07-18 DIAGNOSIS — E7800 Pure hypercholesterolemia, unspecified: Secondary | ICD-10-CM | POA: Diagnosis not present

## 2019-07-18 DIAGNOSIS — C50919 Malignant neoplasm of unspecified site of unspecified female breast: Secondary | ICD-10-CM | POA: Diagnosis not present

## 2019-07-18 DIAGNOSIS — E1169 Type 2 diabetes mellitus with other specified complication: Secondary | ICD-10-CM | POA: Diagnosis not present

## 2019-07-19 DIAGNOSIS — M47816 Spondylosis without myelopathy or radiculopathy, lumbar region: Secondary | ICD-10-CM | POA: Diagnosis not present

## 2019-07-19 DIAGNOSIS — G894 Chronic pain syndrome: Secondary | ICD-10-CM | POA: Diagnosis not present

## 2019-07-19 DIAGNOSIS — Z79899 Other long term (current) drug therapy: Secondary | ICD-10-CM | POA: Diagnosis not present

## 2019-07-19 DIAGNOSIS — M150 Primary generalized (osteo)arthritis: Secondary | ICD-10-CM | POA: Diagnosis not present

## 2019-08-08 ENCOUNTER — Encounter: Payer: Self-pay | Admitting: Gynecology

## 2019-08-14 ENCOUNTER — Telehealth: Payer: Self-pay | Admitting: Adult Health

## 2019-08-14 NOTE — Telephone Encounter (Signed)
I left a message regarding reschedule °

## 2019-08-16 DIAGNOSIS — I10 Essential (primary) hypertension: Secondary | ICD-10-CM | POA: Diagnosis not present

## 2019-08-16 DIAGNOSIS — C50919 Malignant neoplasm of unspecified site of unspecified female breast: Secondary | ICD-10-CM | POA: Diagnosis not present

## 2019-08-16 DIAGNOSIS — E7800 Pure hypercholesterolemia, unspecified: Secondary | ICD-10-CM | POA: Diagnosis not present

## 2019-08-16 DIAGNOSIS — E1169 Type 2 diabetes mellitus with other specified complication: Secondary | ICD-10-CM | POA: Diagnosis not present

## 2019-08-17 DIAGNOSIS — G894 Chronic pain syndrome: Secondary | ICD-10-CM | POA: Diagnosis not present

## 2019-08-17 DIAGNOSIS — Z79899 Other long term (current) drug therapy: Secondary | ICD-10-CM | POA: Diagnosis not present

## 2019-08-17 DIAGNOSIS — M47816 Spondylosis without myelopathy or radiculopathy, lumbar region: Secondary | ICD-10-CM | POA: Diagnosis not present

## 2019-08-17 DIAGNOSIS — M150 Primary generalized (osteo)arthritis: Secondary | ICD-10-CM | POA: Diagnosis not present

## 2019-09-07 ENCOUNTER — Encounter: Payer: PPO | Admitting: Adult Health

## 2019-09-11 DIAGNOSIS — Z1159 Encounter for screening for other viral diseases: Secondary | ICD-10-CM | POA: Diagnosis not present

## 2019-09-11 DIAGNOSIS — G2581 Restless legs syndrome: Secondary | ICD-10-CM | POA: Diagnosis not present

## 2019-09-11 DIAGNOSIS — E7800 Pure hypercholesterolemia, unspecified: Secondary | ICD-10-CM | POA: Diagnosis not present

## 2019-09-11 DIAGNOSIS — I10 Essential (primary) hypertension: Secondary | ICD-10-CM | POA: Diagnosis not present

## 2019-09-11 DIAGNOSIS — E1169 Type 2 diabetes mellitus with other specified complication: Secondary | ICD-10-CM | POA: Diagnosis not present

## 2019-09-11 DIAGNOSIS — E559 Vitamin D deficiency, unspecified: Secondary | ICD-10-CM | POA: Diagnosis not present

## 2019-09-11 DIAGNOSIS — Z0000 Encounter for general adult medical examination without abnormal findings: Secondary | ICD-10-CM | POA: Diagnosis not present

## 2019-09-11 DIAGNOSIS — R5383 Other fatigue: Secondary | ICD-10-CM | POA: Diagnosis not present

## 2019-09-11 DIAGNOSIS — K219 Gastro-esophageal reflux disease without esophagitis: Secondary | ICD-10-CM | POA: Diagnosis not present

## 2019-09-15 DIAGNOSIS — I10 Essential (primary) hypertension: Secondary | ICD-10-CM | POA: Diagnosis not present

## 2019-09-15 DIAGNOSIS — E7800 Pure hypercholesterolemia, unspecified: Secondary | ICD-10-CM | POA: Diagnosis not present

## 2019-09-15 DIAGNOSIS — C50919 Malignant neoplasm of unspecified site of unspecified female breast: Secondary | ICD-10-CM | POA: Diagnosis not present

## 2019-09-15 DIAGNOSIS — E1169 Type 2 diabetes mellitus with other specified complication: Secondary | ICD-10-CM | POA: Diagnosis not present

## 2019-09-17 DIAGNOSIS — G2581 Restless legs syndrome: Secondary | ICD-10-CM | POA: Diagnosis not present

## 2019-09-17 DIAGNOSIS — I10 Essential (primary) hypertension: Secondary | ICD-10-CM | POA: Diagnosis not present

## 2019-09-17 DIAGNOSIS — K219 Gastro-esophageal reflux disease without esophagitis: Secondary | ICD-10-CM | POA: Diagnosis not present

## 2019-09-17 DIAGNOSIS — E559 Vitamin D deficiency, unspecified: Secondary | ICD-10-CM | POA: Diagnosis not present

## 2019-09-17 DIAGNOSIS — E1169 Type 2 diabetes mellitus with other specified complication: Secondary | ICD-10-CM | POA: Diagnosis not present

## 2019-09-17 DIAGNOSIS — Z0000 Encounter for general adult medical examination without abnormal findings: Secondary | ICD-10-CM | POA: Diagnosis not present

## 2019-09-17 DIAGNOSIS — R5383 Other fatigue: Secondary | ICD-10-CM | POA: Diagnosis not present

## 2019-09-17 DIAGNOSIS — Z1159 Encounter for screening for other viral diseases: Secondary | ICD-10-CM | POA: Diagnosis not present

## 2019-09-17 DIAGNOSIS — E7800 Pure hypercholesterolemia, unspecified: Secondary | ICD-10-CM | POA: Diagnosis not present

## 2019-09-25 ENCOUNTER — Telehealth: Payer: Self-pay | Admitting: Adult Health

## 2019-09-25 NOTE — Telephone Encounter (Signed)
I left a message regarding video visit  °

## 2019-09-28 ENCOUNTER — Inpatient Hospital Stay: Payer: PPO | Attending: Adult Health | Admitting: Adult Health

## 2019-09-28 ENCOUNTER — Encounter: Payer: Self-pay | Admitting: Adult Health

## 2019-09-28 DIAGNOSIS — C50411 Malignant neoplasm of upper-outer quadrant of right female breast: Secondary | ICD-10-CM | POA: Diagnosis not present

## 2019-09-28 DIAGNOSIS — Z170 Estrogen receptor positive status [ER+]: Secondary | ICD-10-CM | POA: Diagnosis not present

## 2019-09-28 NOTE — Progress Notes (Signed)
SURVIVORSHIP VIRTUAL VISIT:  I connected with Lauren [name] on 09/28/19 at  3:30 PM EST by my chart video and verified that I am speaking with the correct person using two identifiers.  I discussed the limitations, risks, security and privacy concerns of performing an evaluation and management service virtually and the availability of in person appointments. I also discussed with the patient that there may be a patient responsible charge related to this service. The patient expressed understanding and agreed to proceed.   BRIEF ONCOLOGIC HISTORY:  Oncology History  Malignant neoplasm of upper-outer quadrant of right breast in female, estrogen receptor positive ([place])  11/09/2018 Initial Diagnosis   73 y.o. [place], [place] woman status post right breast upper outer quadrant biopsy 10/18/2018 for a clinical T1b N0, stage IA invasive ductal carcinoma, grade 2, estrogen and progesterone receptor positive, MIB-1 5%, with no HER-2 amplification by [facility]     11/16/2018 Surgery   definitive surgery 11/16/2018: IDC, 1.2cm, grade 2, margins negative, 1 SLN negative             (a) Oncotype also read tumor is HER-2 negative   11/16/2018 Oncotype testing   12/3%   12/04/2018 Genetic Testing   Negative genetic testing on the multicancer panel.  DICER1 c.2118C>T (Silent) VUS identified.  The Multi-Gene Panel offered by [facility] includes sequencing and/or deletion duplication testing of the following 84 genes: AIP, ALK, APC, ATM, AXIN2,BAP1,  BARD1, BLM, BMPR1A, BRCA1, BRCA2, BRIP1, CASR, CDC73, CDH1, CDK4, CDKN1B, CDKN1C, CDKN2A (p14ARF), CDKN2A (p16INK4a), CEBPA, CHEK2, CTNNA1, DICER1, DIS3L2, EGFR (c.2369C>T, p.Thr790Met variant only), EPCAM (Deletion/duplication testing only), FH, FLCN, GATA2, GPC3, GREM1 (Promoter region deletion/duplication testing only), HOXB13 (c.251G>A, p.Gly84Glu), HRAS, KIT, MAX, MEN1, MET, MITF (c.952G>A, p.Glu318Lys variant only), MLH1, MSH2, MSH3, MSH6, MUTYH, NBN, NF1, NF2, NTHL1, PALB2,  PDGFRA, PHOX2B, PMS2, POLD1, POLE, POT1, PRKAR1A, PTCH1, PTEN, RAD50, RAD51C, RAD51D, RB1, RECQL4, RET, RUNX1, SDHAF2, SDHA (sequence changes only), SDHB, SDHC, SDHD, SMAD4, SMARCA4, SMARCB1, SMARCE1, STK11, SUFU, TERC, TERT, TMEM127, TP53, TSC1, TSC2, VHL, WRN and WT1.  The report date is 12/04/2018.  Update:  DICER1 c.2118C>T (Silent) has been reclassified to Likely Benign as a result of re-review of the evidence in light of new variant interpretation guidelines and/or new information.  The updated report date is 02/15/2019.   01/02/2019 - 01/29/2019 Radiation Therapy   The patient initially received a dose of 42.56 Gy in 16 fractions to the right breast using whole-breast tangent fields. This was delivered using a 3-D conformal technique. The patient then received a boost. This delivered an additional 8 Gy in 4 fractions using a 3 field photon technique. The total dose was 50.56 Gy.   01/2019 -  Anti-estrogen oral therapy   anastrozole started 02/13/2019             (a) bone density 01/06/2012 was normal     INTERVAL HISTORY:  Lauren [name] to review her survivorship care plan detailing her treatment course for breast cancer, as well as monitoring long-term side effects of that treatment, education regarding health maintenance, screening, and overall wellness and health promotion.     Overall, Lauren [name] reports feeling quite well. She is taking Anastrozole daily.  She does not hot flashes.  She has tried Brewing technologist which didn't help so she is taking cymbalta and that is working.  She has occasional vaginal dryness and arthralgias but these are tolerable.  She is up to date with her PCP visits and her screenings.    REVIEW OF SYSTEMS:  Review of Systems  Constitutional: Negative for appetite change, chills, fatigue, fever and unexpected weight change.  HENT:   Negative for hearing loss, lump/mass, sore throat and voice change.   Eyes: Negative for eye problems and icterus.  Respiratory: Negative for  chest tightness, cough, shortness of breath and wheezing.   Cardiovascular: Negative for chest pain, leg swelling and palpitations.  Gastrointestinal: Negative for abdominal distention and abdominal pain.  Endocrine: Positive for hot flashes.  Genitourinary: Negative for difficulty urinating and dyspareunia.   Musculoskeletal: Negative for arthralgias and back pain.  Skin: Negative for itching and rash.  Neurological: Negative for dizziness, extremity weakness, headaches and numbness.  Hematological: Negative for adenopathy. Does not bruise/bleed easily.  Psychiatric/Behavioral: Negative for depression. The patient is not nervous/anxious.   Breast: Denies any new nodularity, masses, tenderness, nipple changes, or nipple discharge.      ONCOLOGY TREATMENT TEAM:  1. Surgeon:  Dr. [name] at [facility] 2. Medical Oncologist: Dr. Jana Hakim  3. Radiation Oncologist: Dr. Lisbeth Renshaw    PAST MEDICAL/SURGICAL HISTORY:  Past Medical History:  Diagnosis Date  . Arthritis    Back, Hip- right  . Cancer ([place])    right breast  . Cataract   . Diabetes mellitus without complication ([place])    Type II  . Endometriosis   . Family history of adverse reaction to anesthesia    Father - N/V - blood pressure; daughter N/V  . Family history of breast cancer   . Family history of colon cancer   . Family history of kidney cancer   . Family history of prostate cancer   . GERD (gastroesophageal reflux disease)   . History of kidney stones   . History of ulcerative colitis   . Hypertension   . Insomnia   . 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 Neuropathy   . Pneumonia    1983, 2003  . PVCs (premature ventricular contractions)    benign  . Umbilical hernia   . Vitamin D deficiency   . Wears glasses    Past Surgical History:  Procedure Laterality Date  . APPENDECTOMY    . BREAST BIOPSY  08-13-2009  DR TSUEI   EXCISION LEFT NIPPLE DUCT  . BREAST EXCISIONAL BIOPSY Left    x2  . BREAST  EXCISIONAL BIOPSY Right   . CHOLECYSTECTOMY  1992  . COLON RESECTION  1986   ULCERATIVE COLITIS  . COLON SURGERY    . ESOPHAGEAL MANOMETRY N/A 06/11/2013   Procedure: ESOPHAGEAL MANOMETRY (EM);  Surgeon: Jeryl Columbia, MD;  Location: [facility] ENDOSCOPY;  Service: Endoscopy;  Laterality: N/A;  . ESOPHAGOGASTRODUODENOSCOPY (EGD) WITH PROPOFOL N/A 05/31/2016   Procedure: ESOPHAGOGASTRODUODENOSCOPY (EGD) WITH PROPOFOL;  Surgeon: Clarene Essex, MD;  Location[facility] END[facility]COPY;  Service: Endoscopy;  Laterality: N/A;  . EXCISION RIGHT BREAST MASS   10-20-2005  DR Marylene Buerger  . FRACTURE SURGERY     left arm  . kidney stone removed  2/11  . KNEE ARTHROSCOPY Left    MCL  . LEFT THUMB CARPOMETACARPAL JOINT SUSPENSIONPLASTY  04-06-2006  DR Burney Gauze  . LEFT WRIST ARTHROSCOPY W/ DEBRIDEMENT AND REMOVAL CYST  03-26-2004  DR Burney Gauze  . MYOMECTOMY    . RADIOACTIVE SEED GUIDED PARTIAL MASTECTOMY WITH AXILLARY SENTINEL LYMPH NODE BIOPSY Right 11/16/2018   Procedure: RIGHT BREAST RADIOACTIVE SEED GUIDED PARTIAL MASTECTOMY WITH  SENTINEL  NODE BIOPSY;  Surgeon: Coralie Keens, MD;  Location: [place];  Service: General;  Laterality: Right;  . RIGHT  COLECTOMY    . RIGHT URETEROSCOPIC STONE EXTRACTION  12-11-2009  DR Irine Seal  . TENDON RELEASE    . TONSILLECTOMY    . TOTAL HIP ARTHROPLASTY Right 11/26/2016   Procedure: RIGHT TOTAL HIP ARTHROPLASTY ANTERIOR APPROACH;  Surgeon: Mcarthur Rossetti, MD;  Location: [facility];  Service: Orthopedics;  Laterality: Right;  . TOTAL KNEE ARTHROPLASTY Left 12/02/2017   Procedure: LEFT TOTAL KNEE ARTHROPLASTY;  Surgeon: Mcarthur Rossetti, MD;  Location: [facility];  Service: Orthopedics;  Laterality: Left;  . WRIST SURGERY     both     ALLERGIES:  Allergies  Allergen Reactions  . Ciprofloxacin Hcl Hives  . Percocet [Oxycodone-Acetaminophen] Other (See Comments)    RESPIRATORY DISTRESS  . Penicillins Hives and Other (See Comments)    Has patient had a PCN reaction causing  immediate rash, facial/tongue/throat swelling, SOB or lightheadedness with hypotension: no Has patient had a PCN reaction causing severe rash involving mucus membranes or skin necrosis: no Has patient had a PCN reaction that required hospitalization no Has patient had a PCN reaction occurring within the last 10 years: no If all of the above answers are "NO", then may proceed with Cephalosporin use.  . Sulfa Antibiotics Hives  . Tape Other (See Comments)    Adhesives - Causes Blisters. No band aids, and plastic tape   . Tetanus Toxoids Hives and Rash  . Jardiance [Empagliflozin]      CURRENT MEDICATIONS:  Outpatient Encounter Medications as of 09/28/2019  Medication Sig Note  . amLODipine (NORVASC) 5 MG tablet TAKE 1 TABLET BY MOUTH ONCE DAILY *NEED  OFFICE  VISIT* (Patient taking differently: Take 5 mg by mouth daily. )   . anastrozole (ARIMIDEX) 1 MG tablet Take 1 tablet (1 mg total) by mouth daily.   Marland Kitchen aspirin EC 81 MG tablet Take 81 mg by mouth daily.   Marland Kitchen b complex vitamins tablet Take 1 tablet by mouth daily.   . balsalazide (COLAZAL) 750 MG capsule Take 2,250 mg by mouth 3 (three) times daily.    . Cholecalciferol (HM VITAMIN D3) 2000 units CAPS Take 2,000 Units by mouth daily.    . cholestyramine (QUESTRAN) 4 GM/DOSE powder Take 4 g by mouth daily.    . furosemide (LASIX) 20 MG tablet Take 20 mg by mouth daily.    Marland Kitchen gabapentin (NEURONTIN) 300 MG capsule Take 600 mg by mouth at bedtime.    . Lancets (ONETOUCH DELICA PLUS [id number]) MISC    . lidocaine (ASPERCREME W/LIDOCAINE) 4 % cream Apply 1 application topically daily as needed (for joint pain).    . magnesium gluconate (MAGONATE) 500 MG tablet Take 500 mg by mouth at bedtime.   . metFORMIN (GLUCOPHAGE-XR) 500 MG 24 hr tablet Take 1,000 mg by mouth 2 (two) times daily.    . methocarbamol (ROBAXIN) 500 MG tablet Take 1 tablet (500 mg total) by mouth every 6 (six) hours as needed for muscle spasms. (Patient taking differently: Take  500 mg by mouth at bedtime. )   . Misc Natural Products (JOINT HEALTH) CAPS Take 300 mg by mouth daily. Arthrocen 300 mg Supplement   . ONETOUCH VERIO test strip    . Oxymetazoline HCl (NASAL SPRAY NA) Place 1-2 sprays into both nostrils at bedtime as needed (for allergies). 10/19/2017: Neosynephrine Nasal Spray  . pantoprazole (PROTONIX) 40 MG tablet Take 40 mg by mouth 2 (two) times daily.    . pioglitazone (ACTOS) 15 MG tablet Take 15 mg by mouth daily.   Marland Kitchen  quinapril (ACCUPRIL) 40 MG tablet Take 40 mg by mouth daily.   . rosuvastatin (CRESTOR) 20 MG tablet TAKE 1 TABLET BY MOUTH EVERY DAY (Patient taking differently: Take 20 mg by mouth daily. )   . venlafaxine (EFFEXOR) 37.5 MG tablet TAKE 1 TABLET (37.5 MG TOTAL) BY MOUTH 2 (TWO) TIMES DAILY.    No facility-administered encounter medications on file as of 09/28/2019.      ONCOLOGIC FAMILY HISTORY:  Family History  Problem Relation Age of Onset  . Hypertension Father   . Heart disease Father   . Diabetes Father   . Cancer Father        colon, prostate, and kidney  . Hypertension Mother   . Parkinson's disease Mother   . Heart disease Maternal Grandmother   . Diabetes Maternal Grandmother   . Heart disease Maternal Grandfather   . Diabetes Maternal Grandfather   . Cancer Maternal Grandfather        pt unaware of what kind  . Stroke Paternal Grandmother   . Stroke Paternal Grandfather   . Colon cancer Paternal Aunt        dx less than 74  . Cancer Maternal Aunt        cervical vs ovarian  . Prostate cancer Paternal Uncle   . Prostate cancer Other        PGF's father  . Breast cancer Paternal Aunt        dx in her 52s  . Breast cancer Cousin        pat first cousin, dx over 60     GENETIC COUNSELING/TESTING: See above  SOCIAL HISTORY:  Social History   Socioeconomic History  . Marital status: Married    Spouse name: Not on file  . Number of children: Not on file  . Years of education: Not on file  . Highest  education level: Not on file  Occupational History  . Not on file  Social Needs  . Financial resource strain: Not on file  . Food insecurity    Worry: Not on file    Inability: Not on file  . Transportation needs    Medical: No    Non-medical: No  Tobacco Use  . Smoking status: Former Smoker    Packs/day: 1.00    Years: 10.00    Pack years: 10.00    Types: Cigarettes    Quit date: 09/22/1982    Years since quitting: 37.0  . Smokeless tobacco: Never Used  . Tobacco comment: quit 1983  Substance and Sexual Activity  . Alcohol use: No  . Drug use: No  . Sexual activity: Yes    Partners: Male    Birth control/protection: Post-menopausal  Lifestyle  . Physical activity    Days per week: Not on file    Minutes per session: Not on file  . Stress: Not on file  Relationships  . Social Herbalist on phone: Not on file    Gets together: Not on file    Attends religious service: Not on file    Active member of club or organization: Not on file    Attends meetings of clubs or organizations: Not on file    Relationship status: Not on file  . Intimate partner violence    Fear of current or ex partner: Not on file    Emotionally abused: Not on file    Physically abused: Not on file    Forced sexual activity: Not on file  Other Topics Concern  . Not on file  Social History Narrative   Epworth Sleepiness Scale = 4 (as of 09/12/15)     OBSERVATIONS/OBJECTIVE:  Patient appears well.  She is in no apparent distress.  Mood and behavior are normal.  Breathing non labored.  Skin visualized without rash or lesion.  LABORATORY DATA:  None for this visit.  DIAGNOSTIC IMAGING:  None for this visit.      ASSESSMENT AND PLAN:  Lauren [name] is a pleasant 73 y.o. female with Stage IA right breast invasive ductal carcinoma, ER+/PR+/HER2-, diagnosed in 11/2018, treated with lumpectomy, adjuvant radiation therapy, and anti-estrogen therapy with Anastrozole beginning in 01/2019.   She presents to the Survivorship Clinic for our initial meeting and routine follow-up post-completion of treatment for breast cancer.    1. Stage IA right breast cancer:  Lauren [name] is continuing to recover from definitive treatment for breast cancer. She will follow-up with her medical oncologist, Dr. Jana Hakim in 01/2020 with history and physical exam per surveillance protocol.  She will continue her anti-estrogen therapy with Anastrozole. Thus far, she is tolerating the Anastrozole well, with minimal side effects. She was instructed to make Dr. Lindi Adie or myself aware if she begins to experience any worsening side effects of the medication and I could see her back in clinic to help manage those side effects, as needed. Her mammogram is due 10/2019; orders placed today. Today, a comprehensive survivorship care plan and treatment summary was reviewed with the patient today detailing her breast cancer diagnosis, treatment course, potential late/long-term effects of treatment, appropriate follow-up care with recommendations for the future, and patient education resources.  A copy of this summary, along with a letter will be sent to the patient's primary care provider via mail/fax/In Basket message after today's visit.    2. Hot flashes: Managed with Cymbalta.  3. Bone health:  Given Lauren [name] age/history of breast cancer and her current treatment regimen including anti-estrogen therapy with Anastrozole, she is at risk for bone demineralization.  Her last DEXA scan was in 2013 and was normal.  She is scheduled for repeat testing in 11/2019.  In the meantime, she was encouraged to increase her consumption of foods rich in calcium, as well as increase her weight-bearing activities.  She was given education on specific activities to promote bone health.  4. Cancer screening:  Due to Lauren [name] history and her age, she should receive screening for skin cancers, colon cancer, and gynecologic cancers.  The  information and recommendations are listed on the patient's comprehensive care plan/treatment summary and were reviewed in detail with the patient.    5. Health maintenance and wellness promotion: Lauren [name] was encouraged to consume 5-7 servings of fruits and vegetables per day. We reviewed the "Nutrition Rainbow" handout, as well as the handout "Take Control of Your Health and Reduce Your Cancer Risk" from the [employer].  She was also encouraged to engage in moderate to vigorous exercise for 30 minutes per day most days of the week. We discussed the [employer] fitness program, which is designed for cancer survivors to help them become more physically fit after cancer treatments.  She was instructed to limit her alcohol consumption and continue to abstain from tobacco use.     6. Support services/counseling: It is not uncommon for this period of the patient's cancer care trajectory to be one of many emotions and stressors.  We discussed how this can be increasingly difficult during the times of  quarantine and social distancing due to the COVID-19 pandemic.   She was given information regarding our available services and encouraged to contact me with any questions or for help enrolling in any of our support group/programs.    Follow up instructions:    -Return to cancer center in 01/2020 for f/u with Dr. Jana Hakim  -Mammogram due in 10/2019 -Bone density testing in 11/2019 -Follow up with Dr. [name] -She is welcome to return back to the Survivorship Clinic at any time; no additional follow-up needed at this time.  -Consider referral back to survivorship as a long-term survivor for continued surveillance  The patient was provided an opportunity to ask questions and all were answered. The patient agreed with the plan and demonstrated an understanding of the instructions.   The patient was advised to call back or seek an in-person evaluation if the symptoms worsen or if the  condition fails to improve as anticipated.   I provided 25  minutes of face-to-face video visit time during this encounter, and > 50% was spent counseling as documented under my assessment & plan.  Scot Dock, NP

## 2019-10-08 ENCOUNTER — Other Ambulatory Visit: Payer: Self-pay

## 2019-10-08 ENCOUNTER — Ambulatory Visit
Admission: RE | Admit: 2019-10-08 | Discharge: 2019-10-08 | Disposition: A | Discharge status: Home / Self Care | Payer: PPO | Source: Ambulatory Visit | Attending: Oncology | Admitting: Oncology

## 2019-10-08 DIAGNOSIS — Z170 Estrogen receptor positive status [ER+]: Secondary | ICD-10-CM

## 2019-10-08 DIAGNOSIS — C50411 Malignant neoplasm of upper-outer quadrant of right female breast: Secondary | ICD-10-CM

## 2019-10-08 DIAGNOSIS — R928 Other abnormal and inconclusive findings on diagnostic imaging of breast: Secondary | ICD-10-CM | POA: Diagnosis not present

## 2019-10-12 DIAGNOSIS — M47816 Spondylosis without myelopathy or radiculopathy, lumbar region: Secondary | ICD-10-CM | POA: Diagnosis not present

## 2019-10-12 DIAGNOSIS — M150 Primary generalized (osteo)arthritis: Secondary | ICD-10-CM | POA: Diagnosis not present

## 2019-10-12 DIAGNOSIS — Z79899 Other long term (current) drug therapy: Secondary | ICD-10-CM | POA: Diagnosis not present

## 2019-10-12 DIAGNOSIS — G894 Chronic pain syndrome: Secondary | ICD-10-CM | POA: Diagnosis not present

## 2019-10-20 ENCOUNTER — Emergency Department (HOSPITAL_BASED_OUTPATIENT_CLINIC_OR_DEPARTMENT_OTHER): Payer: PPO

## 2019-10-20 ENCOUNTER — Other Ambulatory Visit: Payer: Self-pay

## 2019-10-20 ENCOUNTER — Encounter (HOSPITAL_BASED_OUTPATIENT_CLINIC_OR_DEPARTMENT_OTHER): Payer: Self-pay

## 2019-10-20 ENCOUNTER — Emergency Department (HOSPITAL_BASED_OUTPATIENT_CLINIC_OR_DEPARTMENT_OTHER)
Admission: EM | Admit: 2019-10-20 | Discharge: 2019-10-20 | Disposition: A | Discharge status: Home / Self Care | Payer: PPO | Attending: Emergency Medicine | Admitting: Emergency Medicine

## 2019-10-20 DIAGNOSIS — E1140 Type 2 diabetes mellitus with diabetic neuropathy, unspecified: Secondary | ICD-10-CM | POA: Insufficient documentation

## 2019-10-20 DIAGNOSIS — Y999 Unspecified external cause status: Secondary | ICD-10-CM | POA: Insufficient documentation

## 2019-10-20 DIAGNOSIS — I10 Essential (primary) hypertension: Secondary | ICD-10-CM | POA: Diagnosis not present

## 2019-10-20 DIAGNOSIS — Z7984 Long term (current) use of oral hypoglycemic drugs: Secondary | ICD-10-CM | POA: Insufficient documentation

## 2019-10-20 DIAGNOSIS — S01511A Laceration without foreign body of lip, initial encounter: Secondary | ICD-10-CM

## 2019-10-20 DIAGNOSIS — Y9389 Activity, other specified: Secondary | ICD-10-CM | POA: Diagnosis not present

## 2019-10-20 DIAGNOSIS — S0990XA Unspecified injury of head, initial encounter: Secondary | ICD-10-CM | POA: Diagnosis present

## 2019-10-20 DIAGNOSIS — S0033XA Contusion of nose, initial encounter: Secondary | ICD-10-CM | POA: Diagnosis not present

## 2019-10-20 DIAGNOSIS — W108XXA Fall (on) (from) other stairs and steps, initial encounter: Secondary | ICD-10-CM | POA: Insufficient documentation

## 2019-10-20 DIAGNOSIS — Z87891 Personal history of nicotine dependence: Secondary | ICD-10-CM | POA: Insufficient documentation

## 2019-10-20 DIAGNOSIS — S0081XA Abrasion of other part of head, initial encounter: Secondary | ICD-10-CM | POA: Diagnosis not present

## 2019-10-20 DIAGNOSIS — Z79899 Other long term (current) drug therapy: Secondary | ICD-10-CM | POA: Insufficient documentation

## 2019-10-20 DIAGNOSIS — Y92511 Restaurant or cafe as the place of occurrence of the external cause: Secondary | ICD-10-CM | POA: Insufficient documentation

## 2019-10-20 DIAGNOSIS — W19XXXA Unspecified fall, initial encounter: Secondary | ICD-10-CM

## 2019-10-20 NOTE — Discharge Instructions (Addendum)
Your CT scan in the ER today did not show signs of brain bleed or broken bones.  As I explained, you will feel very sore for the next week - likely worse tomorrow than today.  Take tylenol or motrin at home as needed for pain.  If your pain and symptoms are not improving after 1 week, you should see your primary care doctor in the office.  You have a small cut to your lip which will heal on its own.  It does not need stitches.  You will have a fat lip for several days.

## 2019-10-20 NOTE — ED Triage Notes (Signed)
Pt tripped over a curb and hit her face. Pt c/o pain to the nose and has abrasions on the nose. Denies LOC.

## 2019-10-20 NOTE — ED Provider Notes (Signed)
[facility] EMERGENCY DEPARTMENT Provider Note   CSN: [id number] Arrival date & time: 10/20/19  1908     History Chief Complaint  Patient presents with  . Fall    Lauren [name] is a 73 y.o. female presented to the emergency department with mechanical fall and head injury.  She reports that she was on her way to a restaurant approximately an hour ago and tripped over the top step.  She fell on her outstretched right hand and struck her right knee on the ground, and also struck her forehead and nose directly onto the ground.  There is no loss of consciousness.  She is not on blood thinners.  She reports a mild frontal headache but denies any neck pain or lower back pain.  She reports that she did have a nosebleed immediately after the accident but is since stopped.  No nausea, vomiting.  HPI     Past Medical History:  Diagnosis Date  . Arthritis    Back, Hip- right  . Cancer ([place])    right breast  . Cataract   . Diabetes mellitus without complication ([place])    Type II  . Endometriosis   . Family history of adverse reaction to anesthesia    Father - N/V - blood pressure; daughter N/V  . Family history of breast cancer   . Family history of colon cancer   . Family history of kidney cancer   . Family history of prostate cancer   . GERD (gastroesophageal reflux disease)   . History of kidney stones   . History of ulcerative colitis   . Hypertension   . Insomnia   . 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 Neuropathy   . Pneumonia    1983, 2003  . PVCs (premature ventricular contractions)    benign  . Umbilical hernia   . Vitamin D deficiency   . Wears glasses     Patient Active Problem List   Diagnosis Date Noted  . Unilateral primary osteoarthritis, right knee 01/03/2019  . Genetic testing 12/07/2018  . Family history of breast cancer   . Family history of prostate cancer   . Family history of colon cancer   . Family history of kidney cancer   . Malignant  neoplasm of upper-outer quadrant of right breast in female, estrogen receptor positive ([place]) 11/09/2018  . Diabetic neuropathy, type II diabetes mellitus ([place]) 11/09/2018  . 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/02/2017  . Trochanteric bursitis, right hip 05/10/2017  . Primary osteoarthritis of left knee 03/09/2017  . Unilateral primary osteoarthritis, right hip 11/26/2016  . Status post total replacement of right hip 11/26/2016  . DDD (degenerative disc disease), 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  . Low back pain 04/23/2015  . Hiatal hernia 04/13/2013  . Type II or unspecified type diabetes mellitus without mention of complication, uncontrolled 01/26/2013  . Umbilical hernia [id number]  . Hypertension     Past Surgical History:  Procedure Laterality Date  . APPENDECTOMY    . BREAST BIOPSY  08-13-2009  DR TSUEI   EXCISION LEFT NIPPLE DUCT  . BREAST EXCISIONAL BIOPSY Left    x2  . BREAST EXCISIONAL BIOPSY Right   . BREAST LUMPECTOMY Right 11/16/2018   invasive ductal  . CHOLECYSTECTOMY  1992  . COLON RESECTION  1986   ULCERATIVE COLITIS  . COLON SURGERY    . ESOPHAGEAL MANOMETRY N/A 06/11/2013   Procedure: ESOPHAGEAL MANOMETRY (EM);  Surgeon: Jeryl Columbia, MD;  Location: [facility];  Service: Endoscopy;  Laterality: N/A;  . ESOPHAGOGASTRODUODENOSCOPY (EGD) WITH PROPOFOL N/A 05/31/2016   Procedure: ESOPHAGOGASTRODUODENOSCOPY (EGD) WITH PROPOFOL;  Surgeon: Clarene Essex, MD;  Location: [facility] ENDOSCOPY;  Service: Endoscopy;  Laterality: N/A;  . EXCISION RIGHT BREAST MASS   10-20-2005  DR Marylene Buerger  . FRACTURE SURGERY     left arm  . kidney stone removed  2/11  . KNEE ARTHROSCOPY Left    MCL  . LEFT THUMB CARPOMETACARPAL JOINT SUSPENSIONPLASTY  04-06-2006  DR Burney Gauze  . LEFT WRIST ARTHROSCOPY W/ DEBRIDEMENT AND REMOVAL CYST  03-26-2004  DR Burney Gauze  . MYOMECTOMY    . RADIOACTIVE SEED GUIDED PARTIAL MASTECTOMY WITH AXILLARY SENTINEL LYMPH NODE BIOPSY Right 11/16/2018   Procedure: RIGHT  BREAST RADIOACTIVE SEED GUIDED PARTIAL MASTECTOMY WITH  SENTINEL  NODE BIOPSY;  Surgeon: Coralie Keens, MD;  Location: [facility];  Service: General;  Laterality: Right;  . RIGHT COLECTOMY    . RIGHT URETEROSCOPIC STONE EXTRACTION  12-11-2009  DR Irine Seal  . TENDON RELEASE    . TONSILLECTOMY    . TOTAL HIP ARTHROPLASTY Right 11/26/2016   Procedure: RIGHT TOTAL HIP ARTHROPLASTY ANTERIOR APPROACH;  Surgeon: Mcarthur Rossetti, MD;  Location: [facility];  Service: Orthopedics;  Laterality: Right;  . TOTAL KNEE ARTHROPLASTY Left 12/02/2017   Procedure: LEFT TOTAL KNEE ARTHROPLASTY;  Surgeon: Mcarthur Rossetti, MD;  Location: [facility];  Service: Orthopedics;  Laterality: Left;  . WRIST SURGERY     both     OB History    Gravida  2   Para  2   Term      Preterm      AB      Living  2     SAB      TAB      Ectopic      Multiple      Live Births              Family History  Problem Relation Age of Onset  . Hypertension Father   . Heart disease Father   . Diabetes Father   . Cancer Father        colon, prostate, and kidney  . Hypertension Mother   . Parkinson's disease Mother   . Heart disease Maternal Grandmother   . Diabetes Maternal Grandmother   . Heart disease Maternal Grandfather   . Diabetes Maternal Grandfather   . Cancer Maternal Grandfather        pt unaware of what kind  . Stroke Paternal Grandmother   . Stroke Paternal Grandfather   . Colon cancer Paternal Aunt        dx less than 63  . Cancer Maternal Aunt        cervical vs ovarian  . Prostate cancer Paternal Uncle   . Prostate cancer Other        PGF's father  . Breast cancer Paternal Aunt        dx in her 80s  . Breast cancer Cousin        pat first cousin, dx over 64    Social History   Tobacco Use  . Smoking status: Former Smoker    Packs/day: 1.00    Years: 10.00    Pack years: 10.00    Types: Cigarettes    Quit date: 09/22/1982    Years since quitting: 37.1  . Smokeless  tobacco: Never Used  . Tobacco comment: quit 1983  Substance Use Topics  .  Alcohol use: No  . Drug use: No    Home Medications Prior to Admission medications   Medication Sig Start Date End Date Taking? Authorizing Provider  amLODipine (NORVASC) 5 MG tablet TAKE 1 TABLET BY MOUTH ONCE DAILY *NEED  OFFICE  VISIT* Patient taking differently: Take 5 mg by mouth daily.  09/04/18   Skeet Latch, MD  anastrozole (ARIMIDEX) 1 MG tablet Take 1 tablet (1 mg total) by mouth daily. 02/13/19   Magrinat, Virgie Dad, MD  aspirin EC 81 MG tablet Take 81 mg by mouth daily.    [provider]  b complex vitamins tablet Take 1 tablet by mouth daily.    [provider]  balsalazide (COLAZAL) 750 MG capsule Take 2,250 mg by mouth 3 (three) times daily.  04/22/16   [provider]  Cholecalciferol (HM VITAMIN D3) 2000 units CAPS Take 2,000 Units by mouth daily.     [provider]  cholestyramine Lucrezia Starch) 4 GM/DOSE powder Take 4 g by mouth daily.     [provider]  DULoxetine (CYMBALTA) 20 MG capsule Take 20 mg by mouth daily.    [provider]  furosemide (LASIX) 20 MG tablet Take 20 mg by mouth daily.  06/29/12   [provider]  gabapentin (NEURONTIN) 300 MG capsule Take 600 mg by mouth at bedtime.  09/04/15   [provider]  Lancets (ONETOUCH DELICA PLUS [id number]) [place]  10/27/18   [provider]  lidocaine (ASPERCREME W/LIDOCAINE) 4 % cream Apply 1 application topically daily as needed (for joint pain).     [provider]  magnesium gluconate (MAGONATE) 500 MG tablet Take 500 mg by mouth at bedtime.    [provider]  metFORMIN (GLUCOPHAGE-XR) 500 MG 24 hr tablet Take 1,000 mg by mouth 2 (two) times daily.  07/17/12   [provider]  methocarbamol (ROBAXIN) 500 MG tablet Take 1 tablet (500 mg total) by mouth every 6 (six) hours as needed for muscle spasms. Patient taking differently: Take 500  mg by mouth at bedtime.  12/03/17   Mcarthur Rossetti, MD  Misc Natural Products [facility]) CAPS Take 300 mg by mouth daily. Arthrocen 300 mg Supplement    [provider]  ONETOUCH VERIO test strip  10/27/18   [provider]  Oxymetazoline HCl (NASAL SPRAY NA) Place 1-2 sprays into both nostrils at bedtime as needed (for allergies).    [provider]  pantoprazole (PROTONIX) 40 MG tablet Take 40 mg by mouth 2 (two) times daily.     [provider]  pioglitazone (ACTOS) 15 MG tablet Take 15 mg by mouth daily.    [provider]  quinapril (ACCUPRIL) 40 MG tablet Take 40 mg by mouth daily.    [provider]  rosuvastatin (CRESTOR) 20 MG tablet TAKE 1 TABLET BY MOUTH EVERY DAY Patient taking differently: Take 20 mg by mouth daily.  10/03/18   Skeet Latch, MD    Allergies    Ciprofloxacin hcl, Percocet [oxycodone-acetaminophen], Penicillins, Sulfa antibiotics, Tape, Tetanus toxoids, and Jardiance [empagliflozin]  Review of Systems   Review of Systems  Constitutional: Negative for chills and fever.  Eyes: Negative for photophobia and visual disturbance.  Respiratory: Negative for cough and shortness of breath.   Cardiovascular: Negative for chest pain and palpitations.  Gastrointestinal: Negative for nausea and vomiting.  Musculoskeletal: Positive for arthralgias and myalgias.  Skin: Positive for wound. Negative for rash.  Neurological: Positive for headaches. Negative for syncope.  All other systems reviewed and are negative.   Physical Exam Updated Vital Signs BP (!) 165/77 (BP Location: Right Arm)   Pulse 76   Temp 99 F (37.2 C) (Oral)   Resp 20   Ht 5' 4.5" (1.638 m)   Wt 98.4 kg   SpO2 99%   BMI 36.67 kg/m   Physical Exam Vitals and nursing note reviewed.  Constitutional:      General: She is not in acute distress.    Appearance: She is well-developed.  HENT:     Head: Normocephalic.     Jaw: No  trismus, tenderness, swelling or pain on movement.     Comments: Tenderness and mild swelling over bridge of nose No septal hematoma No gross nasal deformity (nose is bent to left at baseline per husband's report) Fat upper and lower lip with superifical abrasion, does not cross vermillion border No loose teeth, partially edentulous along upper ridgeline Eyes:     Conjunctiva/sclera: Conjunctivae normal.  Cardiovascular:     Rate and Rhythm: Normal rate and regular rhythm.     Pulses: Normal pulses.  Pulmonary:     Effort: Pulmonary effort is normal. No respiratory distress.     Breath sounds: Normal breath sounds.  Musculoskeletal:        General: No swelling or tenderness.     Cervical back: Normal range of motion and neck supple. No tenderness.     Comments: RUE: Full ROM at shoulder and elbow. No pain with flexion / extension / abduction / adduction at shoulder. No pain with flexion / extension / pronation / supination at elbow. Right wrist non-tender. No snuffbox tenderness. No pain with axial loading of R thumb. No superficial tenderness (skin) of R palm. No tenderness of dorsal metacarpal bones. Intact opposition, finger abduction, and wrist flexion / extension. No isolated tenderness of the right patella No isolated tenderness of the fibular head Patient able to flex knee to 90 degrees Patient able to bear weight immediately after incident and here in the ED. No evidence of posterior knee dislocation.    Skin:    General: Skin is warm and dry.  Neurological:     General: No focal deficit present.     Mental Status: She is alert and oriented to person, place, and time.  Psychiatric:        Mood and Affect: Mood normal.        Behavior: Behavior normal.     ED Results / Procedures / Treatments   Labs (all labs ordered are listed, but only abnormal results are displayed) Labs Reviewed - No data to display  EKG None  Radiology CT Head Wo Contrast  Result  Date: 10/20/2019 CLINICAL DATA:  Tripped over curb and hit head. Headache and facial abrasions. Initial encounter. EXAM: CT HEAD WITHOUT CONTRAST TECHNIQUE: Contiguous axial images were obtained from the base of the skull through the vertex without intravenous contrast. COMPARISON:  None. FINDINGS: Brain: No evidence of acute infarction, hemorrhage, hydrocephalus, extra-axial collection, or mass lesion/mass effect. Vascular:  No hyperdense vessel or other acute findings. Skull: No evidence of fracture or other significant bone abnormality. Sinuses/Orbits:  No acute findings. Other: None. IMPRESSION: Negative noncontrast head CT. Electronically Signed   By: Marlaine Hind M.D.   On: 10/20/2019 20:14    Procedures Procedures (including critical care time)  Medications Ordered in ED Medications - No data to display  ED Course  I have reviewed the triage vital signs and the nursing notes.  Pertinent labs & imaging results that were available during my care of the patient were reviewed by me and considered in my medical decision making (see chart for details).  73 year old female presenting with a mechanical fall and striking her face on the ground proxy 1 hour prior to arrival.  She may have a mild nondisplaced nasal fracture based on the swelling and pain on exam.  However nares are pain is able to breathe out of each of them.  She has no evidence of septal hematoma.  There is no gross deformity of her nose.  Plan to get a CT scan of the brain given her age and the mechanism of injury to rule out a possible subdural bleed.  Otherwise she has no spinal pain or midline tenderness to suggest a spinal fracture.  She has no isolated tenderness of the patella or the fibula or other [place] criteria to suspect a knee fracture, although her age precludes her from exclusively applying this criteria, have a low suspicion that she has a fracture after this particular mechanism of injury.  Likewise there is no  isolated tenderness of the anatomic snuffbox of the wrist, she is full range of motion of the wrist the right upper extremity.  Do not suspect she is a fracture here.  This note was dictated using dragon dictation software.  Please be aware that there may be minor translation errors as a result of this oral dictation    Final Clinical Impression(s) / ED Diagnoses Final diagnoses:  Fall, initial encounter  Contusion of nose, initial encounter  Lip laceration, initial encounter    Rx / DC Orders ED Discharge Orders    None       Micheal Murad, Carola Rhine, MD 10/20/19 2348

## 2019-10-20 NOTE — ED Notes (Signed)
Family at bedside. 

## 2019-10-21 DIAGNOSIS — M25532 Pain in left wrist: Secondary | ICD-10-CM | POA: Diagnosis not present

## 2019-10-21 DIAGNOSIS — M25531 Pain in right wrist: Secondary | ICD-10-CM | POA: Diagnosis not present

## 2019-10-21 DIAGNOSIS — M25561 Pain in right knee: Secondary | ICD-10-CM | POA: Diagnosis not present

## 2019-10-23 DIAGNOSIS — C50919 Malignant neoplasm of unspecified site of unspecified female breast: Secondary | ICD-10-CM | POA: Diagnosis not present

## 2019-10-23 DIAGNOSIS — E7800 Pure hypercholesterolemia, unspecified: Secondary | ICD-10-CM | POA: Diagnosis not present

## 2019-10-23 DIAGNOSIS — I10 Essential (primary) hypertension: Secondary | ICD-10-CM | POA: Diagnosis not present

## 2019-10-23 DIAGNOSIS — E1169 Type 2 diabetes mellitus with other specified complication: Secondary | ICD-10-CM | POA: Diagnosis not present

## 2019-11-07 ENCOUNTER — Other Ambulatory Visit: Payer: PPO

## 2019-11-13 DIAGNOSIS — C50919 Malignant neoplasm of unspecified site of unspecified female breast: Secondary | ICD-10-CM | POA: Diagnosis not present

## 2019-11-13 DIAGNOSIS — E7800 Pure hypercholesterolemia, unspecified: Secondary | ICD-10-CM | POA: Diagnosis not present

## 2019-11-13 DIAGNOSIS — E1169 Type 2 diabetes mellitus with other specified complication: Secondary | ICD-10-CM | POA: Diagnosis not present

## 2019-11-13 DIAGNOSIS — I10 Essential (primary) hypertension: Secondary | ICD-10-CM | POA: Diagnosis not present

## 2019-12-02 ENCOUNTER — Ambulatory Visit: Payer: PPO

## 2019-12-07 ENCOUNTER — Ambulatory Visit: Payer: PPO | Attending: Internal Medicine

## 2019-12-07 ENCOUNTER — Ambulatory Visit: Payer: PPO

## 2019-12-07 DIAGNOSIS — Z23 Encounter for immunization: Secondary | ICD-10-CM | POA: Insufficient documentation

## 2019-12-07 NOTE — Progress Notes (Signed)
   Covid-19 Vaccination Clinic  Name:  Lauren [name]    MRN: [id number] DOB: July 21, 1946  12/07/2019  [name] was observed post Covid-19 immunization for 15 minutes without incidence. She was provided with Vaccine Information Sheet and instruction to access the V-Safe system.   [name] was instructed to call 911 with any severe reactions post vaccine: Marland Kitchen Difficulty breathing  . Swelling of your face and throat  . A fast heartbeat  . A bad rash all over your body  . Dizziness and weakness    Immunizations Administered    Name Date Dose VIS Date Route   Pfizer COVID-19 Vaccine 12/07/2019  9:47 AM 0.3 mL 10/12/2019 Intramuscular   Manufacturer: [place]   Lot: [id number]   [place]: [id number]

## 2019-12-11 DIAGNOSIS — G894 Chronic pain syndrome: Secondary | ICD-10-CM | POA: Diagnosis not present

## 2019-12-11 DIAGNOSIS — M150 Primary generalized (osteo)arthritis: Secondary | ICD-10-CM | POA: Diagnosis not present

## 2019-12-11 DIAGNOSIS — M47816 Spondylosis without myelopathy or radiculopathy, lumbar region: Secondary | ICD-10-CM | POA: Diagnosis not present

## 2019-12-11 DIAGNOSIS — Z79899 Other long term (current) drug therapy: Secondary | ICD-10-CM | POA: Diagnosis not present

## 2019-12-30 DIAGNOSIS — I10 Essential (primary) hypertension: Secondary | ICD-10-CM | POA: Diagnosis not present

## 2019-12-30 DIAGNOSIS — E1169 Type 2 diabetes mellitus with other specified complication: Secondary | ICD-10-CM | POA: Diagnosis not present

## 2019-12-30 DIAGNOSIS — C50919 Malignant neoplasm of unspecified site of unspecified female breast: Secondary | ICD-10-CM | POA: Diagnosis not present

## 2019-12-30 DIAGNOSIS — E7800 Pure hypercholesterolemia, unspecified: Secondary | ICD-10-CM | POA: Diagnosis not present

## 2020-01-01 ENCOUNTER — Ambulatory Visit: Payer: PPO | Attending: Internal Medicine

## 2020-01-01 DIAGNOSIS — Z23 Encounter for immunization: Secondary | ICD-10-CM | POA: Insufficient documentation

## 2020-01-01 NOTE — Progress Notes (Signed)
   Covid-19 Vaccination Clinic  Name:  Lauren [name]    MRN: [id number] DOB: [birth year]  01/01/2020  [name] was observed post Covid-19 immunization for 30 minutes based on pre-vaccination screening without incident. She was provided with Vaccine Information Sheet and instruction to access the V-Safe system.   [name] was instructed to call 911 with any severe reactions post vaccine: Marland Kitchen Difficulty breathing  . Swelling of face and throat  . A fast heartbeat  . A bad rash all over body  . Dizziness and weakness   Immunizations Administered    Name Date Dose VIS Date Route   Pfizer COVID-19 Vaccine 01/01/2020 11:04 AM 0.3 mL 10/12/2019 Intramuscular   Manufacturer: [place]   Lot: [id number]   [place]: [id number]

## 2020-01-07 ENCOUNTER — Other Ambulatory Visit: Payer: Self-pay

## 2020-01-07 ENCOUNTER — Ambulatory Visit
Admission: RE | Admit: 2020-01-07 | Discharge: 2020-01-07 | Disposition: A | Discharge status: Home / Self Care | Payer: PPO | Source: Ambulatory Visit | Attending: Oncology | Admitting: Oncology

## 2020-01-07 DIAGNOSIS — Z170 Estrogen receptor positive status [ER+]: Secondary | ICD-10-CM

## 2020-01-07 DIAGNOSIS — M85852 Other specified disorders of bone density and structure, left thigh: Secondary | ICD-10-CM | POA: Diagnosis not present

## 2020-01-07 DIAGNOSIS — Z780 Asymptomatic menopausal state: Secondary | ICD-10-CM | POA: Diagnosis not present

## 2020-01-07 DIAGNOSIS — C50411 Malignant neoplasm of upper-outer quadrant of right female breast: Secondary | ICD-10-CM

## 2020-01-16 DIAGNOSIS — I10 Essential (primary) hypertension: Secondary | ICD-10-CM | POA: Diagnosis not present

## 2020-01-16 DIAGNOSIS — E7800 Pure hypercholesterolemia, unspecified: Secondary | ICD-10-CM | POA: Diagnosis not present

## 2020-01-16 DIAGNOSIS — C50919 Malignant neoplasm of unspecified site of unspecified female breast: Secondary | ICD-10-CM | POA: Diagnosis not present

## 2020-01-16 DIAGNOSIS — E1169 Type 2 diabetes mellitus with other specified complication: Secondary | ICD-10-CM | POA: Diagnosis not present

## 2020-02-03 NOTE — Progress Notes (Signed)
[name]  Telephone:([phone] Fax:(336) [phone]     ID: OPLE [name] DOB: 1947-10-75  MR#: [id number]  EHU#:[id number]  Patient Care Team: Lawerance Cruel, MD as PCP - General (Family Medicine) Skeet Latch, MD as PCP - Cardiology (Cardiology) Mcarthur Rossetti, MD as Consulting Physician (Orthopedic Surgery) Leonilda Cozby, Virgie Dad, MD as Consulting Physician (Oncology) Kyung Rudd, MD as Consulting Physician (Radiation Oncology) Huel Cote, NP as Nurse Practitioner (Obstetrics and Gynecology) Clarene Essex, MD as Consulting Physician (Gastroenterology) Coralie Keens, MD as Consulting Physician (General Surgery) OTHER MD:   CHIEF COMPLAINT: Estrogen receptor positive breast cancer  CURRENT TREATMENT: [Anastrozole]   INTERVAL HISTORY: [name] returns today for follow-up of her estrogen receptor positive breast cancer.  She continues on anastrozole.  They are worse at night.  She takes gabapentin nightly and she takes Cymbalta every other night and still has hot flashes.  If she takes the Cymbalta every night she gets shaky hands.  She is considering stopping all 3 medications  Since her last visit, she underwent bilateral diagnostic mammography with tomography at [facility] on 10/08/2019 showing: breast density category B; no evidence of malignancy in either breast.  She also underwent bone density screening on 01/07/2020. This showed a T-score of -1.7, which is considered osteopenic.  She also experienced a fall on 10/20/2019. Head CT was performed at that time, which was negative.  She has received both doses of the Covid-19 vaccination-- on 12/07/2019 and 01/01/2020.   REVIEW OF SYSTEMS: Lynzi and her husband both have had both doses of the [place] vaccine for the COVID-19 disease.  They tolerated them well.  She is having some vaginal dryness issues which she uses a lubricant for.  She walks in the yard but she is not walking as  much as she used to and she is more tired than before.  She has noted a change in her left supraclavicular area.  A detailed review of systems today was otherwise stable   HISTORY OF CURRENT ILLNESS: From the original intake note:  Lauren [name]" had routine screening mammography on 10/04/2018 showing a possible abnormality in the right breast. She underwent unilateral right diagnostic mammography with tomography and right breast ultrasonography at [facility] on 10/12/2018 showing: Breast Density Category C. Spot compression tomosynthesis images through the upper outer quadrant of the right breast demonstrates a persistent irregular mass with indistinct margins measuring approximately 1 cm. On physical exam, no definite palpable mass is identified in the upper-outer right breast. Targeted ultrasound is performed, showing an irregular hypoechoic shadowing mass at 10:30 oclock, 6 cm from the nipple measuring 9 x 8 x 9 mm. Ultrasound of the right axilla demonstrates multiple normal-appearing lymph nodes.  Accordingly on 10/18/2018 she proceeded to biopsy of the right breast area in question. The pathology from this procedure showed ([id number]): invasive ductal carcinoma, grade II. Prognostic indicators significant for: estrogen receptor, 100% positive and progesterone receptor, 20% positive, both with strong staining intensity. Proliferation marker Ki67 at 5%. HER2 equivocal (2+) by immunohistochemisty but negative by fluorescent in situ hybridization with a signals ratio 1.26 and number per cell 2.40.   The patient's subsequent history is as detailed below.   PAST MEDICAL HISTORY: Past Medical History:  Diagnosis Date   Arthritis    Back, Hip- right   Cancer ([place])    right breast   Cataract    Diabetes mellitus without complication (HCC)    Type II   Endometriosis  Family history of adverse reaction to anesthesia    Father - N/V - blood pressure; daughter N/V   Family history of  breast cancer    Family history of colon cancer    Family history of kidney cancer    Family history of prostate cancer    GERD (gastroesophageal reflux disease)    History of kidney stones    History of ulcerative colitis    Hypertension    Insomnia    Lumbar disc disease    L4 L5   Neuropathy    Pneumonia    1983, 2003   PVCs (premature ventricular contractions)    benign   Umbilical hernia    Vitamin D deficiency    Wears glasses     PAST SURGICAL HISTORY: Past Surgical History:  Procedure Laterality Date   APPENDECTOMY     BREAST BIOPSY  08-13-2009  DR TSUEI   EXCISION LEFT NIPPLE DUCT   BREAST EXCISIONAL BIOPSY Left    x2   BREAST EXCISIONAL BIOPSY Right    BREAST LUMPECTOMY Right 11/16/2018   invasive ductal   CHOLECYSTECTOMY  1992   COLON RESECTION  1986   ULCERATIVE COLITIS   COLON SURGERY     ESOPHAGEAL MANOMETRY N/A 06/11/2013   Procedure: ESOPHAGEAL MANOMETRY (EM);  Surgeon: Jeryl Columbia, MD;  Location: [facility];  Service: Endoscopy;  Laterality: N/A;   ESOPHAGOGASTRODUODENOSCOPY (EGD) WITH PROPOFOL N/A 05/31/2016   Procedure: ESOPHAGOGASTRODUODENOSCOPY (EGD) WITH PROPOFOL;  Surgeon: Clarene Essex, MD;  Location: [facility];  Service: Endoscopy;  Laterality: N/A;   EXCISION RIGHT BREAST MASS   10-20-2005  DR Collier Salina YOUNG   FRACTURE SURGERY     left arm   kidney stone removed  2/11   KNEE ARTHROSCOPY Left    MCL   LEFT THUMB CARPOMETACARPAL JOINT SUSPENSIONPLASTY  04-06-2006  DR Burney Gauze   LEFT WRIST ARTHROSCOPY W/ DEBRIDEMENT AND REMOVAL CYST  03-26-2004  DR Burney Gauze   MYOMECTOMY     RADIOACTIVE SEED GUIDED PARTIAL MASTECTOMY WITH AXILLARY SENTINEL LYMPH NODE BIOPSY Right 11/16/2018   Procedure: RIGHT BREAST RADIOACTIVE SEED GUIDED PARTIAL MASTECTOMY WITH  SENTINEL  NODE BIOPSY;  Surgeon: Coralie Keens, MD;  Location: [facility];  Service: General;  Laterality: Right;   RIGHT COLECTOMY     RIGHT URETEROSCOPIC STONE  EXTRACTION  12-11-2009  DR [place] HIP ARTHROPLASTY Right 11/26/2016   Procedure: RIGHT TOTAL HIP ARTHROPLASTY ANTERIOR APPROACH;  Surgeon: Mcarthur Rossetti, MD;  Location: [facility];  Service: Orthopedics;  Laterality: Right;   TOTAL KNEE ARTHROPLASTY Left 12/02/2017   Procedure: LEFT TOTAL KNEE ARTHROPLASTY;  Surgeon: Mcarthur Rossetti, MD;  Location: [facility];  Service: Orthopedics;  Laterality: Left;   WRIST SURGERY     both    FAMILY HISTORY: Family History  Problem Relation Age of Onset   Hypertension Father    Heart disease Father    Diabetes Father    Cancer Father        colon, prostate, and kidney   Hypertension Mother    Parkinson's disease Mother    Heart disease Maternal Grandmother    Diabetes Maternal Grandmother    Heart disease Maternal Grandfather    Diabetes Maternal Grandfather    Cancer Maternal Grandfather        pt unaware of what kind   Stroke Paternal Grandmother    Stroke Paternal Grandfather    Colon cancer Paternal  Aunt        dx less than 50   Cancer Maternal Aunt        cervical vs ovarian   Prostate cancer Paternal Uncle    Prostate cancer Other        PGF's father   Breast cancer Paternal Aunt        dx in her 25s   Breast cancer Cousin        pat first cousin, dx over 80   Cindy's father died from colon cancer at age 81; he was diagnosed with prostate cancer at 81, and he also had kidney cancer. Patients' mother died from failure to thrive at age 35. The patient has no siblings. Lauren [name] had a paternal aunt that was diagnosed with breast cancer in her mid 29's. Lauren [name] also has a paternal great grandfather that was diagnosed with prostate cancer. Patient denies anyone in her family having ovarian cancer.    GYNECOLOGIC HISTORY:  No LMP recorded. Patient is postmenopausal. 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 Her husband, [name], ran a Building surveyor. Together, they have two children, Richard and [employer]. Their son, Delfino Lovett, lives in [place] and is disabled (MS). Their daughter, [name], is a Physiological scientist (and also has MS). Lauren [name] has 5 grandchildren, no great grandchildren. She attends a [employer].    ADVANCED DIRECTIVES: In the absence of any documentation to the contrary, the patient's spouse is their HCPOA.    HEALTH MAINTENANCE: Social History   Tobacco Use   Smoking status: Former Smoker    Packs/day: 1.00    Years: 10.00    Pack years: 10.00    Types: Cigarettes    Quit date: 09/22/1982    Years since quitting: 37.3   Smokeless tobacco: Never Used   Tobacco comment: quit 1983  Substance Use Topics   Alcohol use: No   Drug use: No    Colonoscopy: yes, 3 or 4 years ago  PAP: Stopped at 89; [facility], [facility]  Bone density: 12/2019, -1.7  Allergies  Allergen Reactions   Ciprofloxacin Hcl Hives   Percocet [Oxycodone-Acetaminophen] Other (See Comments)    RESPIRATORY DISTRESS   Penicillins Hives and Other (See Comments)    Has patient had a PCN reaction causing immediate rash, facial/tongue/throat swelling, SOB or lightheadedness with hypotension: no Has patient had a PCN reaction causing severe rash involving mucus membranes or skin necrosis: no Has patient had a PCN reaction that required hospitalization no Has patient had a PCN reaction occurring within the last 10 years: no If all of the above answers are "NO", then may proceed with Cephalosporin use.   Sulfa Antibiotics Hives   Tape Other (See Comments)    Adhesives - Causes Blisters. No band aids, and plastic tape    Tetanus Toxoids Hives and Rash   Jardiance [Empagliflozin]      Current Outpatient Medications  Medication Sig Dispense Refill   amLODipine (NORVASC) 5 MG tablet TAKE 1 TABLET BY MOUTH ONCE DAILY *NEED  OFFICE  VISIT* (Patient taking differently: Take 5 mg by mouth daily. ) 90 tablet 3   anastrozole (ARIMIDEX) 1 MG tablet Take 1 tablet (1 mg total) by mouth daily. 90 tablet 4   aspirin  EC 81 MG tablet Take 81 mg by mouth daily.     b complex vitamins tablet Take 1 tablet by mouth daily.     balsalazide (COLAZAL) 750 MG capsule Take 2,250 mg by mouth 3 (three) times daily.      Cholecalciferol (HM VITAMIN D3) 2000 units CAPS Take 2,000 Units by mouth daily.      cholestyramine (QUESTRAN) 4 GM/DOSE powder Take 4 g by mouth daily.      DULoxetine (CYMBALTA) 20 MG capsule Take 20 mg by mouth daily.     furosemide (LASIX) 20 MG tablet Take 20 mg by mouth daily.      gabapentin (NEURONTIN) 300 MG capsule Take 600 mg by mouth at bedtime.   1   Lancets (ONETOUCH DELICA PLUS [id number]) MISC      lidocaine (ASPERCREME W/LIDOCAINE) 4 % cream Apply 1 application topically daily as needed (for joint pain).      magnesium gluconate (MAGONATE) 500 MG tablet Take 500 mg by mouth at bedtime.     metFORMIN (GLUCOPHAGE-XR) 500 MG 24 hr tablet Take 1,000 mg by mouth 2 (two) times daily.      methocarbamol (ROBAXIN) 500 MG tablet Take 1 tablet (500 mg total) by mouth every 6 (six) hours as needed for muscle spasms. (Patient taking differently: Take 500 mg by mouth at bedtime. ) 60 tablet 0   Misc Natural Products (JOINT HEALTH) CAPS Take 300 mg by mouth daily. Arthrocen 300 mg Supplement     ONETOUCH VERIO test strip      Oxymetazoline HCl (NASAL SPRAY NA) Place 1-2 sprays into both nostrils at bedtime as needed (for allergies).     pantoprazole (PROTONIX) 40 MG tablet Take 40 mg by mouth 2 (two) times daily.      pioglitazone (ACTOS) 15 MG tablet Take 15 mg by mouth daily.     quinapril (ACCUPRIL) 40 MG tablet Take 40 mg by mouth daily.      rosuvastatin (CRESTOR) 20 MG tablet TAKE 1 TABLET BY MOUTH EVERY DAY (Patient taking differently: Take 20 mg by mouth daily. ) 90 tablet 1   No current facility-administered medications for this visit.     OBJECTIVE:  white woman in no acute distress  Vitals:   02/04/20 1230  BP: (!) 127/59  Pulse: 76  Resp: 18  Temp: 98.3 F (36.8 C)  SpO2: 96%     Body mass index is 37.75 kg/m.   Wt Readings from Last 3 Encounters:  02/04/20 223 lb 6.4 oz (101.3 kg)  10/20/19 217 lb (98.4 kg)  02/16/19 218 lb (98.9 kg)      ECOG FS:1 - Symptomatic but completely ambulatory  Sclerae unicteric, EOMs intact Wearing a mask In the left supraclavicular area there is soft movable 2cm lymph node, without associated erythema or tenderness. Lungs no rales or rhonchi Heart regular rate and rhythm Abd soft, obese, nontender, positive bowel sounds MSK no focal spinal tenderness, no upper extremity lymphedema Neuro: nonfocal, well oriented, appropriate affect Breasts: The right breast is status post lumpectomy and radiation.  There is some coarsening of the skin and minimal hyperpigmentation.  The cosmetic result is good.  The left breast is benign.  Both axillae are benign.   LAB RESULTS:  CMP     Component Value Date/Time   NA 141 11/27/2018 1505   K 4.9 11/27/2018 1505   CL 104 11/27/2018 1505   CO2 28 11/27/2018 1505   GLUCOSE 247 (H) 11/27/2018 1505   BUN  13 11/27/2018 1505   CREATININE 1.07 (H) 11/27/2018 1505   CREATININE 0.96 11/09/2018 1405   CALCIUM 9.6 11/27/2018 1505   PROT 7.1 11/27/2018 1505   ALBUMIN 3.7 11/27/2018 1505   AST 31 11/27/2018 1505   AST 34 11/09/2018 1405   ALT 22 11/27/2018 1505   ALT 24 11/09/2018 1405   ALKPHOS 68 11/27/2018 1505   BILITOT 0.3 11/27/2018 1505   BILITOT 0.4 11/09/2018 1405   GFRNONAA 51 (L) 11/27/2018 1505   GFRNONAA 59 (L) 11/09/2018 1405   GFRAA 60 (L) 11/27/2018 1505   GFRAA >60 11/09/2018 1405    No results found for:  TOTALPROTELP, ALBUMINELP, A1GS, A2GS, BETS, BETA2SER, GAMS, MSPIKE, SPEI  No results found for: KPAFRELGTCHN, LAMBDASER, KAPLAMBRATIO  Lab Results  Component Value Date   WBC 7.1 02/04/2020   NEUTROABS 4.2 02/04/2020   HGB 12.2 02/04/2020   HCT 39.1 02/04/2020   MCV 88.9 02/04/2020   PLT 266 02/04/2020   No results found for: LABCA2  No components found for: [id number]  No results for input(s): INR in the last 168 hours.  No results found for: LABCA2  No results found for: WUJ811  No results found for: BJY782  No results found for: NFA213  No results found for: CA2729  No components found for: HGQUANT  No results found for: CEA1 / No results found for: CEA1   No results found for: AFPTUMOR  No results found for: CHROMOGRNA  No results found for: HGBA, HGBA2QUANT, HGBFQUANT, HGBSQUAN (Hemoglobinopathy evaluation)   No results found for: LDH  No results found for: IRON, TIBC, IRONPCTSAT (Iron and TIBC)  No results found for: FERRITIN  Urinalysis    Component Value Date/Time   COLORURINE YELLOW 09/19/2018 [place] 09/19/2018 1143   LABSPEC 1.010 09/19/2018 1143   PHURINE 6.0 09/19/2018 1143   GLUCOSEU 3+ (A) 09/19/2018 1143   HGBUR TRACE (A) 09/19/2018 1143   BILIRUBINUR NEGATIVE 11/05/2015 1512   [place] 09/19/2018 1143   PROTEINUR NEGATIVE 09/19/2018 1143   UROBILINOGEN 0.2 11/28/2013 1456   NITRITE NEGATIVE 11/05/2015 1512   LEUKOCYTESUR 1+ (A) 11/05/2015 1512    STUDIES:  DG Bone Density  Result Date: 01/07/2020 EXAM: DUAL X-RAY ABSORPTIOMETRY (DXA) FOR BONE MINERAL DENSITY IMPRESSION: Referring Physician:  Chauncey Cruel Your patient completed a BMD test using Lunar IDXA DXA system ( analysis version: 16 ) manufactured by [employer]. Technologist: WLS PATIENT: Name: Casilda, Pickerill Patient ID: [id number] Birth Date: 09/08/1946 Height: 64.0 in. Sex: Female Measured: 01/07/2020 Weight: 224.0 lbs. Indications:  Advanced Age, Anastrazole, Breast Cancer History, Caucasian, cymbalta, Diabetic non insulin, Estrogen Deficient, Gabapentin, Height Loss (781.91), Low Calcium Intake (269.3), Pantoprazole, Postmenopausal Fractures: None Treatments: Vitamin D (E933.5) ASSESSMENT: The BMD measured at Femur Neck is 0.805 g/cm2 with a T-score of -1.7. This patient is considered osteopenic according to [facility]) criteria. The scan quality is limited by patient body habitus. Lumbar spine was not utilized due to advanced degenerative changes. Right femur was excluded due to surgical hardware. Site Region Measured Date Measured Age YA BMD Significant CHANGE T-score Left Femur Neck 01/07/2020 74.2 -1.6 0.805 g/cm2 Right Forearm Radius 33% 01/07/2020 74.2 -0.6 0.834 g/cm2 World Health Organization [facility]) criteria for post-menopausal, Caucasian Women: Normal       T-score at or above -1 SD Osteopenia   T-score between -1 and -2.5 SD Osteoporosis T-score at or below -2.5 SD RECOMMENDATION: 1. All patients should optimize calcium and vitamin D  intake. 2. Consider FDA approved medical therapies in postmenopausal women and men aged 65 years and older, based on the following: a. A hip or vertebral (clinical or morphometric) fracture b. T- score < or = -2.5 at the femoral neck or spine after appropriate evaluation to exclude secondary causes c. Low bone mass (T-score between -1.0 and -2.5 at the femoral neck or spine) and a 10 year probability of a hip fracture > or = 3% or a 10 year probability of a major osteoporosis-related fracture > or = 20% based on the [place]-adapted WHO algorithm d. Clinician judgment and/or patient preferences may indicate treatment for people with 10-year fracture probabilities above or below these levels FOLLOW-UP: Patients with diagnosis of osteoporosis or at high risk for fracture should have regular bone mineral density tests. For patients eligible for Medicare, routine testing is allowed once every 2  years. The testing frequency can be increased to one year for patients who have rapidly progressing disease, those who are receiving or discontinuing medical therapy to restore bone mass, or have additional risk factors. I have reviewed this report and agree with the above findings.  Radiology FRAX* 10-year Probability of Fracture Based on femoral neck BMD: Femur (Left) Major Osteoporotic Fracture: 10.4% Hip Fracture:                2.0% Population:                  [place] (Caucasian) Risk Factors:                None *FRAX is a Materials engineer of the [employer] of [employer] for Metabolic Bone Disease, a World Pharmacologist (WHO) [facility]. ASSESSMENT: The probability of a major osteoporotic fracture is 10.4 % within the next ten years. The probability of a hip fracture is 2.0 % within the next ten years. Electronically Signed   By: Lajean Manes M.D.   On: 01/07/2020 11:28     ELIGIBLE FOR AVAILABLE RESEARCH PROTOCOL: no   ASSESSMENT: 74 y.o. [place], [place] woman status post right breast upper outer quadrant biopsy 10/18/2018 for a clinical T1b N0, stage IA invasive ductal carcinoma, grade 2, estrogen and progesterone receptor positive, MIB-1 5%, with no HER-2 amplification by FISH  (1) right lumpectomy and sentinel lymph node sampling 11/16/2018 showed a pT1c pN0, stage IA invasive ductal carcinoma, grade 2, with negative margins  (a) a single sentinel lymph node was removed  (2) Oncotype score of 12 predicted a risk of recurrence outside the breast over the next 9 years of only 3% if the patient's only systemic treatment was antiestrogens for 5 years.  No significant benefit was anticipated from chemotherapy.  (a) Oncotype also read tumor is HER-2 negative  (3) adjuvant radiation 01/02/2019 to 01/29/2019. Site/dose:   The patient initially received a dose of 42.56 Gy in 16 fractions to the right breast using whole-breast tangent fields. This was delivered  using a 3-D conformal technique. The patient then received a boost. This delivered an additional 8 Gy in 4 fractions using a 3 field photon technique. The total dose was 50.56 Gy.  (4) anastrozole started 02/13/2019  (a) bone density 01/06/2012 was normal  (5) genetics testing 11/27/2018 through the Multi-Gene Panel offered by [facility] found no deleterious mutations in AIP, ALK, APC, ATM, AXIN2,BAP1,  BARD1, BLM, BMPR1A, BRCA1, BRCA2, BRIP1, CASR, CDC73, CDH1, CDK4, CDKN1B, CDKN1C, CDKN2A (p14ARF), CDKN2A (p16INK4a), CEBPA, CHEK2, CTNNA1, DICER1, DIS3L2, EGFR (c.2369C>T, p.Thr790Met variant only), EPCAM (Deletion/duplication  testing only), FH, FLCN, GATA2, GPC3, GREM1 (Promoter region deletion/duplication testing only), HOXB13 (c.251G>A, p.Gly84Glu), HRAS, KIT, MAX, MEN1, MET, MITF (c.952G>A, p.Glu318Lys variant only), MLH1, MSH2, MSH3, MSH6, MUTYH, NBN, NF1, NF2, NTHL1, PALB2, PDGFRA, PHOX2B, PMS2, POLD1, POLE, POT1, PRKAR1A, PTCH1, PTEN, RAD50, RAD51C, RAD51D, RB1, RECQL4, RET, RUNX1, SDHAF2, SDHA (sequence changes only), SDHB, SDHC, SDHD, SMAD4, SMARCA4, SMARCB1, SMARCE1, STK11, SUFU, TERC, TERT, TMEM127, TP53, TSC1, TSC2, VHL, WRN and WT1.    (a) a variant of uncertain significant was found in DICER1, c.2118C>T (silent)   PLAN: Lauren [name] is just a little over a year out from definitive surgery for her breast cancer with no evidence of recurrence.  This is favorable.  She is not doing so well on anastrozole.  She has significant hand pain, she has problems with hot flashes despite medication and she has vaginal dryness.  Of course all of this could be unrelated but it certainly could be due to the anastrozole.  The only way to find out is to stop it and so we are going off that medication for the next 2 months.  I will place a call to her the first week in June and see how her symptoms are doing.  If there are unchanged we will go back to anastrozole but if things got much better we will consider switching  perhaps to tamoxifen  I do not think what she has in the left supraclavicular is a classic very count node.  It is not hard for example.  Nevertheless it is new and her cancer was on the other side and she does have a history of colon cancer in her family.  I am going to go ahead and set her up for a CT of the abdomen and pelvis.  She is already due for repeat colonoscopy sometime in the next few months I believe under Dr. Watt Climes  I encouraged her to exercise on a regular basis.  That will help with the osteopenia recently diagnosed.  Total encounter time 30 minutes.     Medical Oncology and Hematology [facility] [place], [place] [address] Tel. [phone]    Fax. [phone]    I, Wilburn Mylar, am acting as scribe for Dr. Virgie Dad. Jomaira Darr.  I, Lurline Del MD, have reviewed the above documentation for accuracy and completeness, and I agree with the above.    *Total Encounter Time as defined by the Centers for Medicare and Medicaid Services includes, in addition to the face-to-face time of a patient visit (documented in the note above) non-face-to-face time: obtaining and reviewing outside history, ordering and reviewing medications, tests or procedures, care coordination (communications with other health care professionals or caregivers) and documentation in the medical record.

## 2020-02-04 ENCOUNTER — Inpatient Hospital Stay: Payer: PPO | Attending: Oncology | Admitting: Oncology

## 2020-02-04 ENCOUNTER — Other Ambulatory Visit: Payer: Self-pay

## 2020-02-04 ENCOUNTER — Inpatient Hospital Stay: Payer: PPO

## 2020-02-04 VITALS — BP 127/59 | HR 76 | Temp 98.3°F | Resp 18 | Ht 64.5 in | Wt 223.4 lb

## 2020-02-04 DIAGNOSIS — Z8051 Family history of malignant neoplasm of kidney: Secondary | ICD-10-CM | POA: Diagnosis not present

## 2020-02-04 DIAGNOSIS — Z803 Family history of malignant neoplasm of breast: Secondary | ICD-10-CM | POA: Diagnosis not present

## 2020-02-04 DIAGNOSIS — C50411 Malignant neoplasm of upper-outer quadrant of right female breast: Secondary | ICD-10-CM

## 2020-02-04 DIAGNOSIS — Z923 Personal history of irradiation: Secondary | ICD-10-CM | POA: Diagnosis not present

## 2020-02-04 DIAGNOSIS — Z87891 Personal history of nicotine dependence: Secondary | ICD-10-CM | POA: Diagnosis not present

## 2020-02-04 DIAGNOSIS — Z7982 Long term (current) use of aspirin: Secondary | ICD-10-CM | POA: Diagnosis not present

## 2020-02-04 DIAGNOSIS — Z79811 Long term (current) use of aromatase inhibitors: Secondary | ICD-10-CM | POA: Insufficient documentation

## 2020-02-04 DIAGNOSIS — Z8042 Family history of malignant neoplasm of prostate: Secondary | ICD-10-CM | POA: Diagnosis not present

## 2020-02-04 DIAGNOSIS — Z8049 Family history of malignant neoplasm of other genital organs: Secondary | ICD-10-CM | POA: Insufficient documentation

## 2020-02-04 DIAGNOSIS — M8580 Other specified disorders of bone density and structure, unspecified site: Secondary | ICD-10-CM | POA: Insufficient documentation

## 2020-02-04 DIAGNOSIS — E119 Type 2 diabetes mellitus without complications: Secondary | ICD-10-CM | POA: Diagnosis not present

## 2020-02-04 DIAGNOSIS — Z170 Estrogen receptor positive status [ER+]: Secondary | ICD-10-CM | POA: Insufficient documentation

## 2020-02-04 DIAGNOSIS — Z800 Family history of malignant neoplasm of digestive organs: Secondary | ICD-10-CM | POA: Insufficient documentation

## 2020-02-04 DIAGNOSIS — Z79899 Other long term (current) drug therapy: Secondary | ICD-10-CM | POA: Insufficient documentation

## 2020-02-04 DIAGNOSIS — Z8249 Family history of ischemic heart disease and other diseases of the circulatory system: Secondary | ICD-10-CM | POA: Diagnosis not present

## 2020-02-04 DIAGNOSIS — Z7984 Long term (current) use of oral hypoglycemic drugs: Secondary | ICD-10-CM | POA: Insufficient documentation

## 2020-02-04 DIAGNOSIS — Z833 Family history of diabetes mellitus: Secondary | ICD-10-CM | POA: Insufficient documentation

## 2020-02-04 LAB — CBC WITH DIFFERENTIAL/PLATELET
Abs Immature Granulocytes: 0.03 10*3/uL (ref 0.00–0.07)
Basophils Absolute: 0.1 10*3/uL (ref 0.0–0.1)
Basophils Relative: 1 %
Eosinophils Absolute: 0.3 10*3/uL (ref 0.0–0.5)
Eosinophils Relative: 5 %
HCT: 39.1 % (ref 36.0–46.0)
Hemoglobin: 12.2 g/dL (ref 12.0–15.0)
Immature Granulocytes: 0 %
Lymphocytes Relative: 25 %
Lymphs Abs: 1.8 10*3/uL (ref 0.7–4.0)
MCH: 27.7 pg (ref 26.0–34.0)
MCHC: 31.2 g/dL (ref 30.0–36.0)
MCV: 88.9 fL (ref 80.0–100.0)
Monocytes Absolute: 0.8 10*3/uL (ref 0.1–1.0)
Monocytes Relative: 11 %
Neutro Abs: 4.2 10*3/uL (ref 1.7–7.7)
Neutrophils Relative %: 58 %
Platelets: 266 10*3/uL (ref 150–400)
RBC: 4.4 MIL/uL (ref 3.87–5.11)
RDW: 14.4 % (ref 11.5–15.5)
WBC: 7.1 10*3/uL (ref 4.0–10.5)
nRBC: 0 % (ref 0.0–0.2)

## 2020-02-04 LAB — COMPREHENSIVE METABOLIC PANEL
ALT: 14 U/L (ref 0–44)
AST: 17 U/L (ref 15–41)
Albumin: 3.8 g/dL (ref 3.5–5.0)
Alkaline Phosphatase: 62 U/L (ref 38–126)
Anion gap: 10 (ref 5–15)
BUN: 11 mg/dL (ref 8–23)
CO2: 25 mmol/L (ref 22–32)
Calcium: 9.2 mg/dL (ref 8.9–10.3)
Chloride: 104 mmol/L (ref 98–111)
Creatinine, Ser: 0.89 mg/dL (ref 0.44–1.00)
GFR calc Af Amer: >60 mL/min (ref >60)
GFR calc non Af Amer: >60 mL/min (ref >60)
Glucose, Bld: 175 mg/dL — ABNORMAL HIGH (ref 70–99)
Potassium: 4.2 mmol/L (ref 3.5–5.1)
Sodium: 139 mmol/L (ref 135–145)
Total Bilirubin: 0.3 mg/dL (ref 0.3–1.2)
Total Protein: 7 g/dL (ref 6.5–8.1)

## 2020-02-05 ENCOUNTER — Telehealth: Payer: Self-pay | Admitting: Oncology

## 2020-02-05 DIAGNOSIS — Z79899 Other long term (current) drug therapy: Secondary | ICD-10-CM | POA: Diagnosis not present

## 2020-02-05 DIAGNOSIS — M47816 Spondylosis without myelopathy or radiculopathy, lumbar region: Secondary | ICD-10-CM | POA: Diagnosis not present

## 2020-02-05 DIAGNOSIS — G894 Chronic pain syndrome: Secondary | ICD-10-CM | POA: Diagnosis not present

## 2020-02-05 DIAGNOSIS — M150 Primary generalized (osteo)arthritis: Secondary | ICD-10-CM | POA: Diagnosis not present

## 2020-02-05 NOTE — Telephone Encounter (Signed)
Scheduled appts per 4/5 los. Pt confirmed appt dates and times.

## 2020-02-11 ENCOUNTER — Ambulatory Visit (HOSPITAL_COMMUNITY): Payer: PPO

## 2020-02-14 ENCOUNTER — Other Ambulatory Visit: Payer: Self-pay

## 2020-02-14 ENCOUNTER — Ambulatory Visit (HOSPITAL_COMMUNITY)
Admission: RE | Admit: 2020-02-14 | Discharge: 2020-02-14 | Disposition: A | Discharge status: Home / Self Care | Payer: PPO | Source: Ambulatory Visit | Attending: Oncology | Admitting: Oncology

## 2020-02-14 ENCOUNTER — Encounter (HOSPITAL_COMMUNITY): Payer: Self-pay

## 2020-02-14 DIAGNOSIS — K5730 Diverticulosis of large intestine without perforation or abscess without bleeding: Secondary | ICD-10-CM | POA: Diagnosis not present

## 2020-02-14 DIAGNOSIS — C50411 Malignant neoplasm of upper-outer quadrant of right female breast: Secondary | ICD-10-CM | POA: Diagnosis not present

## 2020-02-14 DIAGNOSIS — Z170 Estrogen receptor positive status [ER+]: Secondary | ICD-10-CM

## 2020-02-14 DIAGNOSIS — R222 Localized swelling, mass and lump, trunk: Secondary | ICD-10-CM | POA: Diagnosis not present

## 2020-02-14 MED ORDER — SODIUM CHLORIDE (PF) 0.9 % IJ SOLN
INTRAMUSCULAR | Status: AC
  Filled 2020-02-14: qty 50

## 2020-02-14 MED ORDER — IOHEXOL 300 MG/ML  SOLN
100.0000 mL | Freq: Once | INTRAMUSCULAR | Status: AC | PRN
  Administered 2020-02-14: 100 mL via INTRAVENOUS

## 2020-02-18 ENCOUNTER — Other Ambulatory Visit: Payer: Self-pay

## 2020-02-18 ENCOUNTER — Encounter: Payer: Self-pay | Admitting: Cardiovascular Disease

## 2020-02-18 ENCOUNTER — Ambulatory Visit: Payer: PPO | Admitting: Cardiovascular Disease

## 2020-02-18 VITALS — BP 132/80 | HR 97 | Ht 65.0 in | Wt 226.0 lb

## 2020-02-18 DIAGNOSIS — E7800 Pure hypercholesterolemia, unspecified: Secondary | ICD-10-CM | POA: Diagnosis not present

## 2020-02-18 DIAGNOSIS — I2510 Atherosclerotic heart disease of native coronary artery without angina pectoris: Secondary | ICD-10-CM | POA: Diagnosis not present

## 2020-02-18 DIAGNOSIS — I2584 Coronary atherosclerosis due to calcified coronary lesion: Secondary | ICD-10-CM | POA: Diagnosis not present

## 2020-02-18 DIAGNOSIS — I10 Essential (primary) hypertension: Secondary | ICD-10-CM | POA: Diagnosis not present

## 2020-02-18 DIAGNOSIS — C50919 Malignant neoplasm of unspecified site of unspecified female breast: Secondary | ICD-10-CM | POA: Diagnosis not present

## 2020-02-18 DIAGNOSIS — E1169 Type 2 diabetes mellitus with other specified complication: Secondary | ICD-10-CM | POA: Diagnosis not present

## 2020-02-18 NOTE — Progress Notes (Signed)
Cardiology Office Note   Evaluation Performed:  Follow-up visit  Date:  02/20/2020   ID:  Lauren [name], DOB 12/07/1945, MRN [id number]  PCP:  Lawerance Cruel, MD  Cardiologist:  Skeet Latch, MD  Electrophysiologist:  None   Chief Complaint:  Follow up  History of Present Illness:    Lauren [name] is a 74 y.o. female with asymptomatic coronary calcification, diabetes, hypertension, hyperlipidemia, breast cancer s/p surgery/XRT/chemo and family history of CAD who presents for follow up.    She was initially seen 09/2015 for an evaluation of chest pain.  She had been seen in the emergency department  where cardiac enzymes were negative.  D-dimer was elevated, so she underwent CT-A of the chest that was negative for PE. She had a [employer] 11/2016 that revealed LVEF 72% and no ischemia.  Amlodipine was added to her regimen and her blood pressure has been much better controlled since that time.  Lauren [name] reported chest discomfort.  She was referred for [facility] 09/2018 that revealed LVEF 77% and no ischemia. She was diagnosed with breast cancer.  She had a routine mammogram in December and was found to have localized disease.  She underwent lumpectomy in January followed by radiation.  Since her last appointment she has been doing well.  She wants to start a diet and exercise program.  She has not been getting much exercise lately.  She is limited by chronic pain.  She has not noted any lower extremity edema, orthopnea, or PND.  Her pain is made it difficult for her to cook meals for herself.  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  She is trying to decide whether she will start weight watchers or a mild diabetes diet program.   Past Medical History:  Diagnosis Date  . Arthritis    Back, Hip- right  . Cancer ([place])    right breast  . Cataract   . Coronary artery calcification 02/20/2020  . Diabetes mellitus without complication ([place])    Type II  . Endometriosis   . Family history of adverse reaction to anesthesia    Father - N/V - blood pressure; daughter N/V  . Family history of breast cancer   . Family history of colon cancer   . Family history of kidney cancer   . Family history of prostate cancer   . GERD (gastroesophageal reflux disease)   . History of kidney stones   . History of ulcerative colitis   . Hypertension   . Insomnia   . 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 Neuropathy   . Pneumonia    1983, 2003  . Pure hypercholesterolemia 02/20/2020  . PVCs (premature ventricular contractions)    benign  . Umbilical hernia   . Vitamin D deficiency   . Wears glasses    Past Surgical History:  Procedure Laterality Date  . APPENDECTOMY    . BREAST BIOPSY  08-13-2009  DR TSUEI   EXCISION LEFT NIPPLE DUCT  . BREAST EXCISIONAL BIOPSY Left    x2  . BREAST EXCISIONAL BIOPSY Right   . BREAST LUMPECTOMY Right 11/16/2018   invasive ductal  . CHOLECYSTECTOMY  1992  . COLON RESECTION  1986   ULCERATIVE COLITIS  . COLON SURGERY    . ESOPHAGEAL MANOMETRY N/A 06/11/2013   Procedure: ESOPHAGEAL MANOMETRY (EM);  Surgeon: Jeryl Columbia, MD;  [facility];  Service: Endoscopy;  Laterality: N/A;  .  ESOPHAGOGASTRODUODENOSCOPY (EGD) WITH PROPOFOL N/A 05/31/2016   Procedure: ESOPHAGOGASTRODUODENOSCOPY (EGD) WITH PROPOFOL;  Surgeon: Clarene Essex, MD;  Location: [facility] ENDOSCOPY;  Service: Endoscopy;  Laterality: N/A;  . EXCISION RIGHT BREAST MASS   10-20-2005  DR Marylene Buerger  . FRACTURE SURGERY     left arm  . kidney stone removed  2/11  . KNEE ARTHROSCOPY Left    MCL  . LEFT THUMB CARPOMETACARPAL JOINT SUSPENSIONPLASTY  04-06-2006  DR Burney Gauze  . LEFT WRIST ARTHROSCOPY W/ DEBRIDEMENT AND REMOVAL CYST  03-26-2004  DR Burney Gauze  . MYOMECTOMY    . RADIOACTIVE SEED GUIDED PARTIAL MASTECTOMY WITH AXILLARY SENTINEL LYMPH NODE BIOPSY Right 11/16/2018   Procedure: RIGHT BREAST RADIOACTIVE SEED GUIDED PARTIAL MASTECTOMY WITH   SENTINEL  NODE BIOPSY;  Surgeon: Coralie Keens, MD;  Location: [place];  Service: General;  Laterality: Right;  . RIGHT COLECTOMY    . RIGHT URETEROSCOPIC STONE EXTRACTION  12-11-2009  DR Irine Seal  . TENDON RELEASE    . TONSILLECTOMY    . TOTAL HIP ARTHROPLASTY Right 11/26/2016   Procedure: RIGHT TOTAL HIP ARTHROPLASTY ANTERIOR APPROACH;  Surgeon: Mcarthur Rossetti, MD;  Location: [facility];  Service: Orthopedics;  Laterality: Right;  . TOTAL KNEE ARTHROPLASTY Left 12/02/2017   Procedure: LEFT TOTAL KNEE ARTHROPLASTY;  Surgeon: Mcarthur Rossetti, MD;  [facility];  Service: Orthopedics;  Laterality: Left;  . WRIST SURGERY     both     Current Meds  Medication Sig  . amLODipine (NORVASC) 5 MG tablet TAKE 1 TABLET BY MOUTH ONCE DAILY *NEED  OFFICE  VISIT* (Patient taking differently: Take 5 mg by mouth daily. )  . anastrozole (ARIMIDEX) 1 MG tablet Take 1 tablet (1 mg total) by mouth daily.  Marland Kitchen aspirin EC 81 MG tablet Take 81 mg by mouth daily.  Marland Kitchen b complex vitamins tablet Take 1 tablet by mouth daily.  . balsalazide (COLAZAL) 750 MG capsule Take 2,250 mg by mouth 3 (three) times daily.   . Cholecalciferol (HM VITAMIN D3) 2000 units CAPS Take 2,000 Units by mouth daily.   . cholestyramine (QUESTRAN) 4 GM/DOSE powder Take 4 g by mouth daily.   . DULoxetine (CYMBALTA) 20 MG capsule Take 20 mg by mouth daily.  . furosemide (LASIX) 20 MG tablet Take 20 mg by mouth daily.   Marland Kitchen gabapentin (NEURONTIN) 300 MG capsule Take 600 mg by mouth at bedtime.   . Lancets (ONETOUCH DELICA PLUS 123XX123) MISC   . lidocaine (ASPERCREME W/LIDOCAINE) 4 % cream Apply 1 application topically daily as needed (for joint pain).   . metFORMIN (GLUCOPHAGE-XR) 500 MG 24 hr tablet Take 1,000 mg by mouth 2 (two) times daily.   . methocarbamol (ROBAXIN) 500 MG tablet Take 1 tablet (500 mg total) by mouth every 6 (six) hours as needed for muscle spasms. (Patient taking differently: Take 500 mg by mouth at  bedtime. )  . ONETOUCH VERIO test strip   . Oxymetazoline HCl (NASAL SPRAY NA) Place 1-2 sprays into both nostrils at bedtime as needed (for allergies).  . pantoprazole (PROTONIX) 40 MG tablet Take 40 mg by mouth 2 (two) times daily.   . pioglitazone (ACTOS) 15 MG tablet Take 15 mg by mouth daily.  . quinapril (ACCUPRIL) 40 MG tablet Take 40 mg by mouth daily.  . rosuvastatin (CRESTOR) 20 MG tablet TAKE 1 TABLET BY MOUTH EVERY DAY (Patient taking differently: Take 20 mg by mouth daily. )     Allergies:   Ciprofloxacin hcl, Percocet [  oxycodone-acetaminophen], Penicillins, Sulfa antibiotics, Tape, Tetanus toxoids, and Jardiance [empagliflozin]   Social History   Tobacco Use  . Smoking status: Former Smoker    Packs/day: 1.00    Years: 10.00    Pack years: 10.00    Types: Cigarettes    Quit date: 09/22/1982    Years since quitting: 37.4  . Smokeless tobacco: Never Used  . Tobacco comment: quit 1983  Substance Use Topics  . Alcohol use: No  . Drug use: No     Family Hx: The patient's family history includes Breast cancer in her cousin and paternal aunt; Cancer in her father, maternal aunt, and maternal grandfather; Colon cancer in her paternal aunt; Diabetes in her father, maternal grandfather, and maternal grandmother; Heart disease in her father, maternal grandfather, and maternal grandmother; Hypertension in her father and mother; Parkinson's disease in her mother; Prostate cancer in her paternal uncle and another family member; Stroke in her paternal grandfather and paternal grandmother.  ROS:   Please see the history of present illness.    All other systems reviewed and are negative.   Prior CV studies:   The following studies were reviewed today:  Lexiscan Myoview 09/18/18:  The left ventricular ejection fraction is hyperdynamic (>65%).  Nuclear stress EF: 77%.  There was no ST segment deviation noted during stress.  The study is normal.  This is a low risk study.    Normal pharmacologic nuclear stress test with no evidence for prior infarct or ischemia.   Lexiscan Myoview 11/19/16: Nuclear stress EF: 72%.  The left ventricular ejection fraction is hyperdynamic (>65%).  There was no ST segment deviation noted during stress.  This is a low risk study. No perfusion defects.   Labs/Other Tests and Data Reviewed:    EKG:  An ECG dated 02/18/2020 was personally reviewed today and demonstrated:  Sinus rhythm.  Rate 97 bpm.  Left axis deviation.  Low voltage.  Recent Labs: 02/04/2020: ALT 14; BUN 11; Creatinine, Ser 0.89; Hemoglobin 12.2; Platelets 266; Potassium 4.2; Sodium 139   Recent Lipid Panel No results found for: CHOL, TRIG, HDL, CHOLHDL, LDLCALC, LDLDIRECT   09/17/2019: Total cholesterol 119, triglycerides 132, HDL 49, LDL 43  Wt Readings from Last 3 Encounters:  02/18/20 226 lb (102.5 kg)  02/04/20 223 lb 6.4 oz (101.3 kg)  10/20/19 217 lb (98.4 kg)     Objective:   VS:  BP 132/80   Pulse 97   Ht 5\' 5"  (1.651 m)   Wt 226 lb (102.5 kg)   SpO2 96%   BMI 37.61 kg/m  , BMI Body mass index is 37.61 kg/m. GENERAL:  Well appearing HEENT: Pupils equal round and reactive, fundi not visualized, oral mucosa unremarkable NECK:  No jugular venous distention, waveform within normal limits, carotid upstroke brisk and symmetric, no bruits LUNGS:  Clear to auscultation bilaterally HEART:  RRR.  PMI not displaced or sustained,S1 and S2 within normal limits, no S3, no S4, no clicks, no rubs, no murmurs ABD:  Flat, positive bowel sounds normal in frequency in pitch, no bruits, no rebound, no guarding, no midline pulsatile mass, no hepatomegaly, no splenomegaly EXT:  2 plus pulses throughout, no edema, no cyanosis no clubbing SKIN:  No rashes no nodules NEURO:  Cranial nerves II through XII grossly intact, motor grossly intact throughout PSYCH:  Cognitively intact, oriented to person place and time  ASSESSMENT & PLAN:    # Chest pain:  #  Coronary calcification:  Chest pain resolved.  Lexiscan  Myoview was negative for ischemia 09/2018.  Continue aspirin and rosuvastatin.    # Hypertension:  Blood pressure is very mildly elevated.  She is going to work on diet and exercise program we will refer her to the PREP program through the [facility].   # Hyperlipidemia: Continue rosuvastatin. LDL was 43 on 09/2019.   Time:   Today, I have spent 21 minutes with the patient with telehealth technology discussing the above problems.     Medication Adjustments/Labs and Tests Ordered: Current medicines are reviewed at length with the patient today.  Concerns regarding medicines are outlined above.   Tests Ordered: No orders of the defined types were placed in this encounter.   Medication Changes: No orders of the defined types were placed in this encounter.   Disposition:  Follow up in 1 year(s)  Signed, Skeet Latch, MD  02/20/2020 12:27 PM    [facility]

## 2020-02-18 NOTE — Patient Instructions (Signed)
Medication Instructions:  Your physician recommends that you continue on your current medications as directed. Please refer to the Current Medication list given to you today.  *If you need a refill on your cardiac medications before your next appointment, please call your pharmacy*  Lab Work: NONE  Testing/Procedures: NONE  Follow-Up: At [facility], you and your health needs are our priority.  As part of our continuing mission to provide you with exceptional heart care, we have created designated Provider Care Teams.  These Care Teams include your primary Cardiologist (physician) and Advanced Practice Providers (APPs -  Physician Assistants and Nurse Practitioners) who all work together to provide you with the care you need, when you need it.  We recommend signing up for the patient portal called "MyChart".  Sign up information is provided on this After Visit Summary.  MyChart is used to connect with patients for Virtual Visits (Telemedicine).  Patients are able to view lab/test results, encounter notes, upcoming appointments, etc.  Non-urgent messages can be sent to your provider as well.   To learn more about what you can do with MyChart, go to [email or web address].    Your next appointment:   12 month(s) You will receive a reminder letter in the mail two months in advance. If you don't receive a letter, please call our office to schedule the follow-up appointment.   The format for your next appointment:   In Person  Provider:   You may see Skeet Latch, MD or one of the following Advanced Practice Providers on your designated Care Team:    Kerin Ransom, PA-C  [place], [place]  Coletta Memos, FNP  Other Instructions SOMEONE FROM THE [place]

## 2020-02-20 ENCOUNTER — Encounter: Payer: Self-pay | Admitting: Cardiovascular Disease

## 2020-02-20 DIAGNOSIS — E7800 Pure hypercholesterolemia, unspecified: Secondary | ICD-10-CM

## 2020-02-20 DIAGNOSIS — I2510 Atherosclerotic heart disease of native coronary artery without angina pectoris: Secondary | ICD-10-CM | POA: Insufficient documentation

## 2020-02-20 HISTORY — DX: Pure hypercholesterolemia, unspecified: E78.00

## 2020-02-20 HISTORY — DX: Atherosclerotic heart disease of native coronary artery without angina pectoris: I25.10

## 2020-02-29 ENCOUNTER — Telehealth: Payer: Self-pay

## 2020-02-29 NOTE — Telephone Encounter (Signed)
Patient returned call reference referral from Dr Oval Linsey for [facility].  Described program to patient.Interested in Program and can do afternoon classes.  Has a very bad back(pain, takes pain meds-sees pain mgmt for) hx of cancer surgery, hip and knee replacements.  Recognizes the need for exercise.  Husband has early Alzheimers-worried about coverage for him while she is in class. Has children close by. Offered referral to SW for caregiver support. Declined at present. She will contact me if that changes. Encourage to consider for future when status changes.  Advised next class start is end of May. Agreeable to same.  Will phone her back when dates/time secured.

## 2020-03-05 ENCOUNTER — Other Ambulatory Visit: Payer: Self-pay

## 2020-03-05 ENCOUNTER — Encounter: Payer: Self-pay | Admitting: Orthopaedic Surgery

## 2020-03-05 ENCOUNTER — Ambulatory Visit (INDEPENDENT_AMBULATORY_CARE_PROVIDER_SITE_OTHER): Payer: PPO | Admitting: Orthopaedic Surgery

## 2020-03-05 DIAGNOSIS — G8929 Other chronic pain: Secondary | ICD-10-CM

## 2020-03-05 DIAGNOSIS — M25561 Pain in right knee: Secondary | ICD-10-CM

## 2020-03-05 NOTE — Progress Notes (Signed)
Office Visit Note   Patient: Lauren [name]           Date of Birth: October 16, 1946           MRN: [id number] Visit Date: 03/05/2020              Requested by: Lawerance Cruel, [place],  [place] [address] PCP: Lawerance Cruel, MD   Assessment & Plan: Visit Diagnoses:  1. Chronic pain of right knee     Plan: At this point I feel a MRI of her right knee is warranted to rule out a stress fracture as well as a meniscal tear of the right knee especially with the medial compartment.  This is based on the failure of conservative treatment for over 4 months combined with rest, activity modification, quad strengthening exercises and time.  With her locking catching mechanical symptoms this is certainly warranted.  I will have her try Voltaren gel for her knee and her hands in the interim while we await the results of MRI of her right knee.  All questions and concerns were answered and addressed.  Follow-Up Instructions: Return in about 2 weeks (around 03/19/2020).   Orders:  No orders of the defined types were placed in this encounter.  No orders of the defined types were placed in this encounter.     Procedures: No procedures performed   Clinical Data: No additional findings.   Subjective: Chief Complaint  Patient presents with  . Right Knee - Pain  The patient is very well-known to me.  She is a 74 year old female who has had a left knee replacement before.  We have seen her for her right knee and she does have some mild arthritis in that right knee in the past.  However, she fell about 4 months ago landing on her right knee and has been very painful since then.  X-rays of her knee reviewed after that fall did not show any significant acute findings.  There is some narrowing of the joint line.  She does point to the medial aspect of her knee but more so behind her knee and the back associate of her pain.  He does report knee locking and catching and pain  mainly with weightbearing.  It does not wake her up at night.  She is also dealing with bilateral hand pain in the IP joints of her hands mainly at the middle finger and ring finger on both hands.  She denies any numbness and tingling in her hands.  HPI  Review of Systems She currently denies any headache, chest pain, shortness of breath, fever, chills, nausea, vomiting  Objective: Vital Signs: There were no vitals taken for this visit.  Physical Exam She is alert and orient x3 and in no acute distress Ortho Exam Examination of her right knee shows a positive Murray sign to the medial compartment the knee.  She has posterior medial knee pain.  It is ligamentously stable but painful range of motion especially past 90 degrees of flexion the knee gets very painful. Specialty Comments:  No specialty comments available.  Imaging: No results found.   PMFS History: Patient Active Problem List   Diagnosis Date Noted  . Coronary artery calcification 02/20/2020  . Pure hypercholesterolemia 02/20/2020  . Unilateral primary osteoarthritis, right knee 01/03/2019  . Genetic testing 12/07/2018  . Family history of breast cancer   . Family history of prostate cancer   . Family history of colon  cancer   . Family history of kidney cancer   . Malignant neoplasm of upper-outer quadrant of right breast in female, estrogen receptor positive ([place]) 11/09/2018  . Diabetic neuropathy, type II diabetes mellitus ([place]) 11/09/2018  . 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/02/2017  . Trochanteric bursitis, right hip 05/10/2017  . Primary osteoarthritis of left knee 03/09/2017  . Unilateral primary osteoarthritis, right hip 11/26/2016  . Status post total replacement of right hip 11/26/2016  . DDD (degenerative disc disease), 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  . Low back pain 04/23/2015  . Hiatal hernia 04/13/2013  . Type II or unspecified type diabetes mellitus without mention of complication, uncontrolled  01/26/2013  . Umbilical hernia [id number]  . Essential hypertension    Past Medical History:  Diagnosis Date  . Arthritis    Back, Hip- right  . Cancer ([place])    right breast  . Cataract   . Coronary artery calcification 02/20/2020  . Diabetes mellitus without complication ([place])    Type II  . Endometriosis   . Family history of adverse reaction to anesthesia    Father - N/V - blood pressure; daughter N/V  . Family history of breast cancer   . Family history of colon cancer   . Family history of kidney cancer   . Family history of prostate cancer   . GERD (gastroesophageal reflux disease)   . History of kidney stones   . History of ulcerative colitis   . Hypertension   . Insomnia   . 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 Neuropathy   . Pneumonia    1983, 2003  . Pure hypercholesterolemia 02/20/2020  . PVCs (premature ventricular contractions)    benign  . Umbilical hernia   . Vitamin D deficiency   . Wears glasses     Family History  Problem Relation Age of Onset  . Hypertension Father   . Heart disease Father   . Diabetes Father   . Cancer Father        colon, prostate, and kidney  . Hypertension Mother   . Parkinson's disease Mother   . Heart disease Maternal Grandmother   . Diabetes Maternal Grandmother   . Heart disease Maternal Grandfather   . Diabetes Maternal Grandfather   . Cancer Maternal Grandfather        pt unaware of what kind  . Stroke Paternal Grandmother   . Stroke Paternal Grandfather   . Colon cancer Paternal Aunt        dx less than 8  . Cancer Maternal Aunt        cervical vs ovarian  . Prostate cancer Paternal Uncle   . Prostate cancer Other        PGF's father  . Breast cancer Paternal Aunt        dx in her 73s  . Breast cancer Cousin        pat first cousin, dx over 41    Past Surgical History:  Procedure Laterality Date  . APPENDECTOMY    . BREAST BIOPSY  08-13-2009  DR TSUEI   EXCISION LEFT NIPPLE DUCT  . BREAST EXCISIONAL BIOPSY  Left    x2  . BREAST EXCISIONAL BIOPSY Right   . BREAST LUMPECTOMY Right 11/16/2018   invasive ductal  . CHOLECYSTECTOMY  1992  . COLON RESECTION  1986   ULCERATIVE COLITIS  . COLON SURGERY    . ESOPHAGEAL MANOMETRY N/A 06/11/2013   Procedure: ESOPHAGEAL MANOMETRY (EM);  Surgeon: Altamese Dilling  Drema Balzarine, MD;  [facility];  Service: Endoscopy;  Laterality: N/A;  . ESOPHAGOGASTRODUODENOSCOPY (EGD) WITH PROPOFOL N/A 05/31/2016   Procedure: ESOPHAGOGASTRODUODENOSCOPY (EGD) WITH PROPOFOL;  Surgeon: Clarene Essex, MD;  [facility] ENDOSCOPY;  Service: Endoscopy;  Laterality: N/A;  . EXCISION RIGHT BREAST MASS   10-20-2005  DR Marylene Buerger  . FRACTURE SURGERY     left arm  . kidney stone removed  2/11  . KNEE ARTHROSCOPY Left    MCL  . LEFT THUMB CARPOMETACARPAL JOINT SUSPENSIONPLASTY  04-06-2006  DR Burney Gauze  . LEFT WRIST ARTHROSCOPY W/ DEBRIDEMENT AND REMOVAL CYST  03-26-2004  DR Burney Gauze  . MYOMECTOMY    . RADIOACTIVE SEED GUIDED PARTIAL MASTECTOMY WITH AXILLARY SENTINEL LYMPH NODE BIOPSY Right 11/16/2018   Procedure: RIGHT BREAST RADIOACTIVE SEED GUIDED PARTIAL MASTECTOMY WITH  SENTINEL  NODE BIOPSY;  Surgeon: Coralie Keens, MD;  Location: [place];  Service: General;  Laterality: Right;  . RIGHT COLECTOMY    . RIGHT URETEROSCOPIC STONE EXTRACTION  12-11-2009  DR Irine Seal  . TENDON RELEASE    . TONSILLECTOMY    . TOTAL HIP ARTHROPLASTY Right 11/26/2016   Procedure: RIGHT TOTAL HIP ARTHROPLASTY ANTERIOR APPROACH;  Surgeon: Mcarthur Rossetti, MD;  [facility];  Service: Orthopedics;  Laterality: Right;  . TOTAL KNEE ARTHROPLASTY Left 12/02/2017   Procedure: LEFT TOTAL KNEE ARTHROPLASTY;  Surgeon: Mcarthur Rossetti, MD;  [facility];  Service: Orthopedics;  Laterality: Left;  . WRIST SURGERY     both   Social History   Occupational History  . Not on file  Tobacco Use  . Smoking status: Former Smoker    Packs/day: 1.00    Years: 10.00    Pack years: 10.00    Types:  Cigarettes    Quit date: 09/22/1982    Years since quitting: 37.4  . Smokeless tobacco: Never Used  . Tobacco comment: quit 1983  Substance and Sexual Activity  . Alcohol use: No  . Drug use: No  . Sexual activity: Yes    Partners: Male    Birth control/protection: Post-menopausal

## 2020-03-07 DIAGNOSIS — E1169 Type 2 diabetes mellitus with other specified complication: Secondary | ICD-10-CM | POA: Diagnosis not present

## 2020-03-07 DIAGNOSIS — R5383 Other fatigue: Secondary | ICD-10-CM | POA: Diagnosis not present

## 2020-03-07 DIAGNOSIS — M2550 Pain in unspecified joint: Secondary | ICD-10-CM | POA: Diagnosis not present

## 2020-03-07 DIAGNOSIS — R2689 Other abnormalities of gait and mobility: Secondary | ICD-10-CM | POA: Diagnosis not present

## 2020-03-07 DIAGNOSIS — G479 Sleep disorder, unspecified: Secondary | ICD-10-CM | POA: Diagnosis not present

## 2020-03-19 ENCOUNTER — Ambulatory Visit: Payer: PPO | Admitting: Orthopaedic Surgery

## 2020-03-21 ENCOUNTER — Telehealth: Payer: Self-pay

## 2020-03-21 DIAGNOSIS — E7800 Pure hypercholesterolemia, unspecified: Secondary | ICD-10-CM | POA: Diagnosis not present

## 2020-03-21 DIAGNOSIS — I10 Essential (primary) hypertension: Secondary | ICD-10-CM | POA: Diagnosis not present

## 2020-03-21 DIAGNOSIS — E1169 Type 2 diabetes mellitus with other specified complication: Secondary | ICD-10-CM | POA: Diagnosis not present

## 2020-03-21 DIAGNOSIS — C50919 Malignant neoplasm of unspecified site of unspecified female breast: Secondary | ICD-10-CM | POA: Diagnosis not present

## 2020-03-21 NOTE — Telephone Encounter (Signed)
Return call from patient.  Is interested in PREP however may have a mensical tear. Saw ORTHO and having MRI on 6/8 so may not be ready for class on 6/8. Will keep patient on list for PREP class and will reach back for another start date.  Asked patient if she would also keep me updated on the status of knee.

## 2020-04-01 DIAGNOSIS — M150 Primary generalized (osteo)arthritis: Secondary | ICD-10-CM | POA: Diagnosis not present

## 2020-04-01 DIAGNOSIS — G894 Chronic pain syndrome: Secondary | ICD-10-CM | POA: Diagnosis not present

## 2020-04-01 DIAGNOSIS — M47816 Spondylosis without myelopathy or radiculopathy, lumbar region: Secondary | ICD-10-CM | POA: Diagnosis not present

## 2020-04-01 DIAGNOSIS — Z79899 Other long term (current) drug therapy: Secondary | ICD-10-CM | POA: Diagnosis not present

## 2020-04-03 ENCOUNTER — Ambulatory Visit
Admission: RE | Admit: 2020-04-03 | Discharge: 2020-04-03 | Disposition: A | Discharge status: Home / Self Care | Payer: PPO | Source: Ambulatory Visit | Attending: Orthopaedic Surgery | Admitting: Orthopaedic Surgery

## 2020-04-03 ENCOUNTER — Inpatient Hospital Stay: Payer: PPO | Attending: Oncology | Admitting: Oncology

## 2020-04-03 DIAGNOSIS — Z87891 Personal history of nicotine dependence: Secondary | ICD-10-CM | POA: Insufficient documentation

## 2020-04-03 DIAGNOSIS — C50411 Malignant neoplasm of upper-outer quadrant of right female breast: Secondary | ICD-10-CM

## 2020-04-03 DIAGNOSIS — Z170 Estrogen receptor positive status [ER+]: Secondary | ICD-10-CM | POA: Diagnosis not present

## 2020-04-03 DIAGNOSIS — Z7984 Long term (current) use of oral hypoglycemic drugs: Secondary | ICD-10-CM | POA: Insufficient documentation

## 2020-04-03 DIAGNOSIS — Z79899 Other long term (current) drug therapy: Secondary | ICD-10-CM | POA: Insufficient documentation

## 2020-04-03 DIAGNOSIS — Z79811 Long term (current) use of aromatase inhibitors: Secondary | ICD-10-CM | POA: Insufficient documentation

## 2020-04-03 DIAGNOSIS — M8580 Other specified disorders of bone density and structure, unspecified site: Secondary | ICD-10-CM | POA: Insufficient documentation

## 2020-04-03 DIAGNOSIS — Z7982 Long term (current) use of aspirin: Secondary | ICD-10-CM | POA: Insufficient documentation

## 2020-04-03 DIAGNOSIS — M7121 Synovial cyst of popliteal space [Baker], right knee: Secondary | ICD-10-CM | POA: Diagnosis not present

## 2020-04-03 DIAGNOSIS — G8929 Other chronic pain: Secondary | ICD-10-CM

## 2020-04-03 DIAGNOSIS — M25561 Pain in right knee: Secondary | ICD-10-CM

## 2020-04-03 MED ORDER — EXEMESTANE 25 MG PO TABS: 25.0000 mg | ORAL_TABLET | Freq: Every day | ORAL | 4 refills | Status: DC

## 2020-04-03 NOTE — Progress Notes (Signed)
Lauren  [phone] [phone]     ID: Lauren [name] DOB: [birth year]  MR#: [id number]  DXI#:[id number]  Patient Care Team: Lawerance Cruel, MD as PCP - General (Family Medicine) Skeet Latch, MD as PCP - Cardiology (Cardiology) Mcarthur Rossetti, MD as Consulting Physician (Orthopedic Surgery) Dale Ribeiro, Virgie Dad, MD as Consulting Physician (Oncology) Kyung Rudd, MD as Consulting Physician (Radiation Oncology) Huel Cote, NP as Nurse Practitioner (Obstetrics and Gynecology) Clarene Essex, MD as Consulting Physician (Gastroenterology) Coralie Keens, MD as Consulting Physician (General Surgery) OTHER MD:  I connected with Lauren [name] on 04/03/20 at 11:00 AM EDT by video enabled telemedicine visit and verified that I am speaking with the correct person using two identifiers.   I discussed the limitations, risks, security and privacy concerns of performing an evaluation and management service by telemedicine and the availability of in-person appointments. I also discussed with the patient that there may be a patient responsible charge related to this service. The patient expressed understanding and agreed to proceed.   Other persons participating in the visit and their role in the encounter: husband was present  Patient's location: home  Provider's location: [place]    CHIEF COMPLAINT: Estrogen receptor positive breast cancer  CURRENT TREATMENT: to start exemestane   INTERVAL HISTORY: Lauren [name] was contacted today for follow-up of her estrogen receptor positive breast cancer.  She had been on anastrozole but developed significant arthralgias and myalgias so we asked her to go off that medication about 2 months ago.  Since that time she has felt progressively better and even her hands which do have arthritis she says are feeling better as well.  Accordingly that is not going to be medicine she can  tolerate long-term.  Her most recent bone density screening on 01/07/2020 showed a T-score of -1.7, which is considered osteopenic.  Since her last visit, she underwent CT abdomen/pelvis on 02/14/2020 for evaluation of her persistent diarrhea and this showed: no definite colonic mass or signs of metastatic disease; nonspecific 5 mm pulmonary nodule in the left lower lobe.  Chest x-ray also performed that day showed no acute abnormality.   REVIEW OF SYSTEMS: Lauren [name] tells me her colitis has settled down.  She is using Questran and other medications appropriately and that is keeping things "under control".  She and her husband both have received the [place] vaccine and tolerated it well.  They are still masking and distancing when they go shopping but of course they are now much more secure aside from these issues a detailed review of systems today was stable   HISTORY OF CURRENT ILLNESS: From the original intake note:  Lauren [name]" had routine screening mammography on 10/04/2018 showing a possible abnormality in the right breast. She underwent unilateral right diagnostic mammography with tomography and right breast ultrasonography at [facility] on 10/12/2018 showing: Breast Density Category C. Spot compression tomosynthesis images through the upper outer quadrant of the right breast demonstrates a persistent irregular mass with indistinct margins measuring approximately 1 cm. On physical exam, no definite palpable mass is identified in the upper-outer right breast. Targeted ultrasound is performed, showing an irregular hypoechoic shadowing mass at 10:30 oclock, 6 cm from the nipple measuring 9 x 8 x 9 mm. Ultrasound of the right axilla demonstrates multiple normal-appearing lymph nodes.  Accordingly on 10/18/2018 she proceeded to biopsy of the right breast area in question. The pathology from this procedure showed ([id number]): invasive ductal carcinoma, grade II.  Prognostic indicators significant  for: estrogen receptor, 100% positive and progesterone receptor, 20% positive, both with strong staining intensity. Proliferation marker Ki67 at 5%. HER2 equivocal (2+) by immunohistochemisty but negative by fluorescent in situ hybridization with a signals ratio 1.26 and number per cell 2.40.   The patient's subsequent history is as detailed below.   PAST MEDICAL HISTORY: Past Medical History:  Diagnosis Date  . Arthritis    Back, Hip- right  . Cancer ([place])    right breast  . Cataract   . Coronary artery calcification 02/20/2020  . Diabetes mellitus without complication ([place])    Type II  . Endometriosis   . Family history of adverse reaction to anesthesia    Father - N/V - blood pressure; daughter N/V  . Family history of breast cancer   . Family history of colon cancer   . Family history of kidney cancer   . Family history of prostate cancer   . GERD (gastroesophageal reflux disease)   . History of kidney stones   . History of ulcerative colitis   . Hypertension   . Insomnia   . 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 Neuropathy   . Pneumonia    1983, 2003  . Pure hypercholesterolemia 02/20/2020  . PVCs (premature ventricular contractions)    benign  . Umbilical hernia   . Vitamin D deficiency   . Wears glasses     PAST SURGICAL HISTORY: Past Surgical History:  Procedure Laterality Date  . APPENDECTOMY    . BREAST BIOPSY  08-13-2009  DR TSUEI   EXCISION LEFT NIPPLE DUCT  . BREAST EXCISIONAL BIOPSY Left    x2  . BREAST EXCISIONAL BIOPSY Right   . BREAST LUMPECTOMY Right 11/16/2018   invasive ductal  . CHOLECYSTECTOMY  1992  . COLON RESECTION  1986   ULCERATIVE COLITIS  . COLON SURGERY    . ESOPHAGEAL MANOMETRY N/A 06/11/2013   Procedure: ESOPHAGEAL MANOMETRY (EM);  Surgeon: Jeryl Columbia, MD;  Location: [facility];  Service: Endoscopy;  Laterality: N/A;  . ESOPHAGOGASTRODUODENOSCOPY (EGD) WITH PROPOFOL N/A 05/31/2016   Procedure: ESOPHAGOGASTRODUODENOSCOPY (EGD) WITH  PROPOFOL;  Surgeon: Clarene Essex, MD;  Location: [facility] ENDOSCOPY;  Service: Endoscopy;  Laterality: N/A;  . EXCISION RIGHT BREAST MASS   10-20-2005  DR Marylene Buerger  . FRACTURE SURGERY     left arm  . kidney stone removed  2/11  . KNEE ARTHROSCOPY Left    MCL  . LEFT THUMB CARPOMETACARPAL JOINT SUSPENSIONPLASTY  04-06-2006  DR Burney Gauze  . LEFT WRIST ARTHROSCOPY W/ DEBRIDEMENT AND REMOVAL CYST  03-26-2004  DR Burney Gauze  . MYOMECTOMY    . RADIOACTIVE SEED GUIDED PARTIAL MASTECTOMY WITH AXILLARY SENTINEL LYMPH NODE BIOPSY Right 11/16/2018   Procedure: RIGHT BREAST RADIOACTIVE SEED GUIDED PARTIAL MASTECTOMY WITH  SENTINEL  NODE BIOPSY;  Surgeon: Coralie Keens, MD;  Location: [facility];  Service: General;  Laterality: Right;  . RIGHT COLECTOMY    . RIGHT URETEROSCOPIC STONE EXTRACTION  12-11-2009  DR Irine Seal  . TENDON RELEASE    . TONSILLECTOMY    . TOTAL HIP ARTHROPLASTY Right 11/26/2016   Procedure: RIGHT TOTAL HIP ARTHROPLASTY ANTERIOR APPROACH;  Surgeon: Mcarthur Rossetti, MD;  Location: [facility];  Service: Orthopedics;  Laterality: Right;  . TOTAL KNEE ARTHROPLASTY Left 12/02/2017   Procedure: LEFT TOTAL KNEE ARTHROPLASTY;  Surgeon: Mcarthur Rossetti, MD;  Location: [facility];  Service: Orthopedics;  Laterality: Left;  . WRIST SURGERY  both    FAMILY HISTORY: Family History  Problem Relation Age of Onset  . Hypertension Father   . Heart disease Father   . Diabetes Father   . Cancer Father        colon, prostate, and kidney  . Hypertension Mother   . Parkinson's disease Mother   . Heart disease Maternal Grandmother   . Diabetes Maternal Grandmother   . Heart disease Maternal Grandfather   . Diabetes Maternal Grandfather   . Cancer Maternal Grandfather        pt unaware of what kind  . Stroke Paternal Grandmother   . Stroke Paternal Grandfather   . Colon cancer Paternal Aunt        dx less than 55  . Cancer Maternal Aunt        cervical vs ovarian  . Prostate cancer  Paternal Uncle   . Prostate cancer Other        PGF's father  . Breast cancer Paternal Aunt        dx in her 14s  . Breast cancer Cousin        pat first cousin, dx over 53   Cindy's father died from colon cancer at age 65; he was diagnosed with prostate cancer at 69, and he also had kidney cancer. Patients' mother died from failure to thrive at age 25. The patient has no siblings. Lauren [name] had a paternal aunt that was diagnosed with breast cancer in her mid 43's. Lauren [name] also has a paternal great grandfather that was diagnosed with prostate cancer. Patient denies anyone in her family having ovarian cancer.    GYNECOLOGIC HISTORY:  No LMP recorded. Patient is postmenopausal. 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 Her husband, Lauren [name], ran a Building surveyor. Together, they have two children, Lauren [name] and Lauren [name]. Their son, Lauren [name], lives in [place] and is disabled (MS). Their daughter, Lauren [name], is a Physiological scientist (and also has MS). Lauren [name] has 5 grandchildren, no great grandchildren. She attends a [employer].    ADVANCED DIRECTIVES: In the absence of any documentation to the contrary, the patient's spouse is their HCPOA.    HEALTH MAINTENANCE: Social History   Tobacco Use  . Smoking status: Former Smoker    Packs/day: 1.00    Years: 10.00    Pack years: 10.00    Types: Cigarettes    Quit date: 09/22/1982    Years since quitting: 37.5  . Smokeless tobacco: Never Used  . Tobacco comment: quit 1983  Substance Use Topics  . Alcohol use: No  . Drug use: No    Colonoscopy: yes, 3 or 4 years ago  PAP: Stopped at 62; [facility], [facility]  Bone density: 12/2019, -1.7  Allergies  Allergen Reactions  . Ciprofloxacin Hcl  Hives  . Percocet [Oxycodone-Acetaminophen] Other (See Comments)    RESPIRATORY DISTRESS  . Penicillins Hives and Other (See Comments)    Has patient had a PCN reaction causing immediate rash, facial/tongue/throat swelling, SOB or lightheadedness with hypotension: no Has patient had a PCN reaction causing severe rash involving mucus membranes or skin necrosis: no Has patient had a PCN reaction that required hospitalization no Has patient had a PCN reaction occurring within the last 10 years: no  If all of the above answers are "NO", then may proceed with Cephalosporin use.  . Sulfa Antibiotics Hives  . Tape Other (See Comments)    Adhesives - Causes Blisters. No band aids, and plastic tape   . Tetanus Toxoids Hives and Rash  . Jardiance [Empagliflozin]     Current Outpatient Medications  Medication Sig Dispense Refill  . amLODipine (NORVASC) 5 MG tablet TAKE 1 TABLET BY MOUTH ONCE DAILY *NEED  OFFICE  VISIT* (Patient taking differently: Take 5 mg by mouth daily. ) 90 tablet 3  . aspirin EC 81 MG tablet Take 81 mg by mouth daily.    Marland Kitchen b complex vitamins tablet Take 1 tablet by mouth daily.    . balsalazide (COLAZAL) 750 MG capsule Take 2,250 mg by mouth 3 (three) times daily.     . Cholecalciferol (HM VITAMIN D3) 2000 units CAPS Take 2,000 Units by mouth daily.     . cholestyramine (QUESTRAN) 4 GM/DOSE powder Take 4 g by mouth daily.     . DULoxetine (CYMBALTA) 20 MG capsule Take 20 mg by mouth daily.    Marland Kitchen exemestane (AROMASIN) 25 MG tablet Take 1 tablet (25 mg total) by mouth daily after breakfast. 90 tablet 4  . furosemide (LASIX) 20 MG tablet Take 20 mg by mouth daily.     Marland Kitchen gabapentin (NEURONTIN) 300 MG capsule Take 600 mg by mouth at bedtime.   1  . Lancets (ONETOUCH DELICA PLUS [id number]) MISC     . lidocaine (ASPERCREME W/LIDOCAINE) 4 % cream Apply 1 application topically daily as needed (for joint pain).     . metFORMIN (GLUCOPHAGE-XR) 500 MG 24 hr tablet Take 1,000 mg by mouth 2  (two) times daily.     . methocarbamol (ROBAXIN) 500 MG tablet Take 1 tablet (500 mg total) by mouth every 6 (six) hours as needed for muscle spasms. (Patient taking differently: Take 500 mg by mouth at bedtime. ) 60 tablet 0  . ONETOUCH VERIO test strip     . Oxymetazoline HCl (NASAL SPRAY NA) Place 1-2 sprays into both nostrils at bedtime as needed (for allergies).    . pantoprazole (PROTONIX) 40 MG tablet Take 40 mg by mouth 2 (two) times daily.     . pioglitazone (ACTOS) 15 MG tablet Take 15 mg by mouth daily.    . quinapril (ACCUPRIL) 40 MG tablet Take 40 mg by mouth daily.    . rosuvastatin (CRESTOR) 20 MG tablet TAKE 1 TABLET BY MOUTH EVERY DAY (Patient taking differently: Take 20 mg by mouth daily. ) 90 tablet 1   No current facility-administered medications for this visit.    OBJECTIVE:  white woman in no acute distress  There were no vitals filed for this visit.   There is no height or weight on file to calculate BMI.   Wt Readings from Last 3 Encounters:  02/18/20 226 lb (102.5 kg)  02/04/20 223 lb 6.4 oz (101.3 kg)  10/20/19 217 lb (98.4 kg)      ECOG FS:1 - Symptomatic but completely ambulatory  Telemedicine visit 04/03/2020  LAB RESULTS:  CMP     Component Value Date/Time   NA 139 02/04/2020 1212   K 4.2 02/04/2020 1212   CL 104 02/04/2020 1212   CO2 25 02/04/2020 1212   GLUCOSE 175 (H) 02/04/2020 1212   BUN 11 02/04/2020 1212   CREATININE 0.89 02/04/2020 1212   CREATININE 0.96 11/09/2018 1405   CALCIUM 9.2 02/04/2020 1212   PROT 7.0  02/04/2020 1212   ALBUMIN 3.8 02/04/2020 1212   AST 17 02/04/2020 1212   AST 34 11/09/2018 1405   ALT 14 02/04/2020 1212   ALT 24 11/09/2018 1405   ALKPHOS 62 02/04/2020 1212   BILITOT 0.3 02/04/2020 1212   BILITOT 0.4 11/09/2018 1405   GFRNONAA >60 02/04/2020 1212   GFRNONAA 59 (L) 11/09/2018 1405   GFRAA >60 02/04/2020 1212   GFRAA >60 11/09/2018 1405    No results found for: TOTALPROTELP, ALBUMINELP, A1GS, A2GS, BETS,  BETA2SER, GAMS, MSPIKE, SPEI  No results found for: KPAFRELGTCHN, LAMBDASER, KAPLAMBRATIO  Lab Results  Component Value Date   WBC 7.1 02/04/2020   NEUTROABS 4.2 02/04/2020   HGB 12.2 02/04/2020   HCT 39.1 02/04/2020   MCV 88.9 02/04/2020   PLT 266 02/04/2020   No results found for: LABCA2  No components found for: [id number]  No results for input(s): INR in the last 168 hours.  No results found for: LABCA2  No results found for: NOI370  No results found for: WUG891  No results found for: QXI503  No results found for: CA2729  No components found for: HGQUANT  No results found for: CEA1 / No results found for: CEA1   No results found for: AFPTUMOR  No results found for: CHROMOGRNA  No results found for: HGBA, HGBA2QUANT, HGBFQUANT, HGBSQUAN (Hemoglobinopathy evaluation)   No results found for: LDH  No results found for: IRON, TIBC, IRONPCTSAT (Iron and TIBC)  No results found for: FERRITIN  Urinalysis    Component Value Date/Time   COLORURINE YELLOW 09/19/2018 [place] 09/19/2018 1143   LABSPEC 1.010 09/19/2018 1143   PHURINE 6.0 09/19/2018 1143   GLUCOSEU 3+ (A) 09/19/2018 1143   HGBUR TRACE (A) 09/19/2018 1143   BILIRUBINUR NEGATIVE 11/05/2015 [place] 09/19/2018 1143   PROTEINUR NEGATIVE 09/19/2018 1143   UROBILINOGEN 0.2 11/28/2013 1456   NITRITE NEGATIVE 11/05/2015 1512   LEUKOCYTESUR 1+ (A) 11/05/2015 1512    STUDIES:  No results found.   ELIGIBLE FOR AVAILABLE RESEARCH PROTOCOL: no   ASSESSMENT: 73 y.o. [place], [place] woman status post right breast upper outer quadrant biopsy 10/18/2018 for a clinical T1b N0, stage IA invasive ductal carcinoma, grade 2, estrogen and progesterone receptor positive, MIB-1 5%, with no HER-2 amplification by FISH  (1) right lumpectomy and sentinel lymph node sampling 11/16/2018 showed a pT1c pN0, stage IA invasive ductal carcinoma, grade 2, with negative margins  (a) a single  sentinel lymph node was removed  (2) Oncotype score of 12 predicted a risk of recurrence outside the breast over the next 9 years of only 3% if the patient's only systemic treatment was antiestrogens for 5 years.  No significant benefit was anticipated from chemotherapy.  (a) Oncotype also read tumor is HER-2 negative  (3) adjuvant radiation 01/02/2019 to 01/29/2019. Site/dose:   The patient initially received a dose of 42.56 Gy in 16 fractions to the right breast using whole-breast tangent fields. This was delivered using a 3-D conformal technique. The patient then received a boost. This delivered an additional 8 Gy in 4 fractions using a 3 field photon technique. The total dose was 50.56 Gy.  (4) anastrozole started 02/13/2019  (a) bone density 01/06/2012 was normal  (b) anastrozole discontinued April 2021, with arthralgias/myalgias  (5) genetics testing 11/27/2018 through the Multi-Gene Panel offered by [facility] found no deleterious mutations in AIP, ALK, APC, ATM, AXIN2,BAP1,  BARD1, BLM, BMPR1A, BRCA1, BRCA2, BRIP1, CASR, CDC73, CDH1, CDK4,  CDKN1B, CDKN1C, CDKN2A (p14ARF), CDKN2A (p16INK4a), CEBPA, CHEK2, CTNNA1, DICER1, DIS3L2, EGFR (c.2369C>T, p.Thr790Met variant only), EPCAM (Deletion/duplication testing only), FH, FLCN, GATA2, GPC3, GREM1 (Promoter region deletion/duplication testing only), HOXB13 (c.251G>A, p.Gly84Glu), HRAS, KIT, MAX, MEN1, MET, MITF (c.952G>A, p.Glu318Lys variant only), MLH1, MSH2, MSH3, MSH6, MUTYH, NBN, NF1, NF2, NTHL1, PALB2, PDGFRA, PHOX2B, PMS2, POLD1, POLE, POT1, PRKAR1A, PTCH1, PTEN, RAD50, RAD51C, RAD51D, RB1, RECQL4, RET, RUNX1, SDHAF2, SDHA (sequence changes only), SDHB, SDHC, SDHD, SMAD4, SMARCA4, SMARCB1, SMARCE1, STK11, SUFU, TERC, TERT, TMEM127, TP53, TSC1, TSC2, VHL, WRN and WT1.    (a) a variant of uncertain significant was found in DICER1, c.2118C>T (silent)  (6) exemestane prescribed 04/03/2020   PLAN: Lauren [name] is clearly feeling much better off the  anastrozole and we are permanently discontinuing that medication.  We discussed exemestane as an alternative aromatase inhibitor.  She understands that it could cause the same symptoms but in many cases it is much better tolerated.  The problem with exemestane is the very variable cost.  It is really impossible to tell how much she will be charged until she actually tries to pick up the medication.  Accordingly I have placed the prescription to [facility] which is her preferred pharmacy.  If she is able to obtain it then we will have another virtual visit in 2 months and if she tolerates it well she will follow-up with me in November as already scheduled in person.  If she cannot afford the medication we will have to consider tamoxifen.  Total encounter time 20 minutes.   Virgie Dad. Anthany Thornhill, MD  Medical Oncology and Hematology [facility] [place], [place] [address] Tel. [phone]    Fax. [phone]    I, Wilburn Mylar, am acting as scribe for Dr. Virgie Dad. Caro Brundidge.  I, Lurline Del MD, have reviewed the above documentation for accuracy and completeness, and I agree with the above.   *Total Encounter Time as defined by the Centers for Medicare and Medicaid Services includes, in addition to the face-to-face time of a patient visit (documented in the note above) non-face-to-face time: obtaining and reviewing outside history, ordering and reviewing medications, tests or procedures, care coordination (communications with other health care professionals or caregivers) and documentation in the medical record.

## 2020-04-07 ENCOUNTER — Encounter: Payer: Self-pay | Admitting: Orthopaedic Surgery

## 2020-04-07 ENCOUNTER — Other Ambulatory Visit: Payer: Self-pay

## 2020-04-07 ENCOUNTER — Ambulatory Visit: Payer: PPO | Admitting: Orthopaedic Surgery

## 2020-04-07 DIAGNOSIS — G8929 Other chronic pain: Secondary | ICD-10-CM

## 2020-04-07 DIAGNOSIS — M25561 Pain in right knee: Secondary | ICD-10-CM | POA: Diagnosis not present

## 2020-04-07 DIAGNOSIS — M1711 Unilateral primary osteoarthritis, right knee: Secondary | ICD-10-CM

## 2020-04-07 MED ORDER — METHYLPREDNISOLONE ACETATE 40 MG/ML IJ SUSP
40.0000 mg | INTRAMUSCULAR | Status: AC | PRN
  Administered 2020-04-07: 40 mg via INTRA_ARTICULAR

## 2020-04-07 MED ORDER — LIDOCAINE HCL 1 % IJ SOLN
3.0000 mL | INTRAMUSCULAR | Status: AC | PRN
  Administered 2020-04-07: 3 mL

## 2020-04-07 NOTE — Progress Notes (Signed)
Office Visit Note   Patient: Lauren [name]           Date of Birth: 08/07/1946           MRN: [id number] Visit Date: 04/07/2020              Requested by: Lawerance Cruel, [place],  [place] [id number] PCP: Lawerance Cruel, MD   Assessment & Plan: Visit Diagnoses:  1. Chronic pain of right knee   2. Unilateral primary osteoarthritis, right knee     Plan: Given the degree of arthritis in her right knee, at some point we will recommend knee replacement surgery.  She understands this as well but does not wish to proceed with knee replacement surgery as of yet.  She would like to try a steroid injection in the office today.  I agree with this treatment plan.  She did tolerate the injection well.  Follow-up will be as needed with knowing that she can wait between 3 and 4 months for another injection or consider knee replacement surgery in the future.  She is fully aware of what that involves having had a knee replacement with her left knee.  Follow-Up Instructions: Return if symptoms worsen or fail to improve.   Orders:  Orders Placed This Encounter  Procedures  . Large Joint Inj   No orders of the defined types were placed in this encounter.     Procedures: Large Joint Inj on 04/07/2020 2:02 PM Indications: diagnostic evaluation and pain Details: 22 G 1.5 in needle, superolateral approach  Arthrogram: No  Medications: 3 mL lidocaine 1 %; 40 mg methylPREDNISolone acetate 40 MG/ML Outcome: tolerated well, no immediate complications Procedure, treatment alternatives, risks and benefits explained, specific risks discussed. Consent was given by the patient. Immediately prior to procedure a time out was called to verify the correct patient, procedure, equipment, support staff and site/side marked as required. Patient was prepped and draped in the usual sterile fashion.       Clinical Data: No additional findings.   Subjective: Chief Complaint    Patient presents with  . Right Knee - Follow-up  The patient comes in today to go over a MRI of her right knee.  She has a history of a left total knee arthroplasty that we did about 2 years ago.  She is 74 years old and very active.  We have tried conservative treatment of the right knee including steroid injections and hyaluronic acid injections.  She still has a lot of pain in the back of her knee and a lot of pain going up stairs with her right knee.  Her left knee is only sensitive when she gets down on it.  She has had no other acute change in her medical status.  HPI  Review of Systems She currently denies any headache, chest pain, shortness of breath, fever, chills, nausea, vomiting  Objective: Vital Signs: There were no vitals taken for this visit.  Physical Exam She is alert and orient x3 and in no acute distress Ortho Exam Examination of her right knee shows pain to the posterior medial aspect and medial joint line as well as the patellofemoral joint.  She has a lot of pain when I try to flex her past 90 degrees. Specialty Comments:  No specialty comments available.  Imaging: No results found. The MRI of her right knee is reviewed with her.  She has moderate to near full-thickness cartilage loss along the  medial femoral condyle.  There is degenerative changes in the medial meniscus.  There is full-thickness cartilage loss of the trochlear groove.  The PCL appears to be having some chronic tearing as does the popliteus tendon.  PMFS History: Patient Active Problem List   Diagnosis Date Noted  . Coronary artery calcification 02/20/2020  . Pure hypercholesterolemia 02/20/2020  . Unilateral primary osteoarthritis, right knee 01/03/2019  . Genetic testing 12/07/2018  . Family history of breast cancer   . Family history of prostate cancer   . Family history of colon cancer   . Family history of kidney cancer   . Malignant neoplasm of upper-outer quadrant of right breast in  female, estrogen receptor positive ([place]) 11/09/2018  . Diabetic neuropathy, type II diabetes mellitus ([place]) 11/09/2018  . 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/02/2017  . Trochanteric bursitis, right hip 05/10/2017  . Primary osteoarthritis of left knee 03/09/2017  . Unilateral primary osteoarthritis, right hip 11/26/2016  . Status post total replacement of right hip 11/26/2016  . DDD (degenerative disc disease), 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  . Low back pain 04/23/2015  . Hiatal hernia 04/13/2013  . Type II or unspecified type diabetes mellitus without mention of complication, uncontrolled 01/26/2013  . Umbilical hernia 03/47/4259  . Essential hypertension    Past Medical History:  Diagnosis Date  . Arthritis    Back, Hip- right  . Cancer ([place])    right breast  . Cataract   . Coronary artery calcification 02/20/2020  . Diabetes mellitus without complication ([place])    Type II  . Endometriosis   . Family history of adverse reaction to anesthesia    Father - N/V - blood pressure; daughter N/V  . Family history of breast cancer   . Family history of colon cancer   . Family history of kidney cancer   . Family history of prostate cancer   . GERD (gastroesophageal reflux disease)   . History of kidney stones   . History of ulcerative colitis   . Hypertension   . Insomnia   . 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 Neuropathy   . Pneumonia    1983, 2003  . Pure hypercholesterolemia 02/20/2020  . PVCs (premature ventricular contractions)    benign  . Umbilical hernia   . Vitamin D deficiency   . Wears glasses     Family History  Problem Relation Age of Onset  . Hypertension Father   . Heart disease Father   . Diabetes Father   . Cancer Father        colon, prostate, and kidney  . Hypertension Mother   . Parkinson's disease Mother   . Heart disease Maternal Grandmother   . Diabetes Maternal Grandmother   . Heart disease Maternal Grandfather   . Diabetes Maternal Grandfather   .  Cancer Maternal Grandfather        pt unaware of what kind  . Stroke Paternal Grandmother   . Stroke Paternal Grandfather   . Colon cancer Paternal Aunt        dx less than 84  . Cancer Maternal Aunt        cervical vs ovarian  . Prostate cancer Paternal Uncle   . Prostate cancer Other        PGF's father  . Breast cancer Paternal Aunt        dx in her 28s  . Breast cancer Cousin        pat first cousin, dx  over 61    Past Surgical History:  Procedure Laterality Date  . APPENDECTOMY    . BREAST BIOPSY  08-13-2009  DR TSUEI   EXCISION LEFT NIPPLE DUCT  . BREAST EXCISIONAL BIOPSY Left    x2  . BREAST EXCISIONAL BIOPSY Right   . BREAST LUMPECTOMY Right 11/16/2018   invasive ductal  . CHOLECYSTECTOMY  1992  . COLON RESECTION  1986   ULCERATIVE COLITIS  . COLON SURGERY    . ESOPHAGEAL MANOMETRY N/A 06/11/2013   Procedure: ESOPHAGEAL MANOMETRY (EM);  Surgeon: Jeryl Columbia, MD;  Location: [facility];  Service: Endoscopy;  Laterality: N/A;  . ESOPHAGOGASTRODUODENOSCOPY (EGD) WITH PROPOFOL N/A 05/31/2016   Procedure: ESOPHAGOGASTRODUODENOSCOPY (EGD) WITH PROPOFOL;  Surgeon: Clarene Essex, MD;  Location: [facility] ENDOSCOPY;  Service: Endoscopy;  Laterality: N/A;  . EXCISION RIGHT BREAST MASS   10-20-2005  DR Marylene Buerger  . FRACTURE SURGERY     left arm  . kidney stone removed  2/11  . KNEE ARTHROSCOPY Left    MCL  . LEFT THUMB CARPOMETACARPAL JOINT SUSPENSIONPLASTY  04-06-2006  DR Burney Gauze  . LEFT WRIST ARTHROSCOPY W/ DEBRIDEMENT AND REMOVAL CYST  03-26-2004  DR Burney Gauze  . MYOMECTOMY    . RADIOACTIVE SEED GUIDED PARTIAL MASTECTOMY WITH AXILLARY SENTINEL LYMPH NODE BIOPSY Right 11/16/2018   Procedure: RIGHT BREAST RADIOACTIVE SEED GUIDED PARTIAL MASTECTOMY WITH  SENTINEL  NODE BIOPSY;  Surgeon: Coralie Keens, MD;  Location: [facility];  Service: General;  Laterality: Right;  . RIGHT COLECTOMY    . RIGHT URETEROSCOPIC STONE EXTRACTION  12-11-2009  DR Irine Seal  . TENDON RELEASE    .  TONSILLECTOMY    . TOTAL HIP ARTHROPLASTY Right 11/26/2016   Procedure: RIGHT TOTAL HIP ARTHROPLASTY ANTERIOR APPROACH;  Surgeon: Mcarthur Rossetti, MD;  Location: [facility];  Service: Orthopedics;  Laterality: Right;  . TOTAL KNEE ARTHROPLASTY Left 12/02/2017   Procedure: LEFT TOTAL KNEE ARTHROPLASTY;  Surgeon: Mcarthur Rossetti, MD;  Location: [facility];  Service: Orthopedics;  Laterality: Left;  . WRIST SURGERY     both   Social History   Occupational History  . Not on file  Tobacco Use  . Smoking status: Former Smoker    Packs/day: 1.00    Years: 10.00    Pack years: 10.00    Types: Cigarettes    Quit date: 09/22/1982    Years since quitting: 37.5  . Smokeless tobacco: Never Used  . Tobacco comment: quit 1983  Substance and Sexual Activity  . Alcohol use: No  . Drug use: No  . Sexual activity: Yes    Partners: Male    Birth control/protection: Post-menopausal

## 2020-04-08 ENCOUNTER — Telehealth: Payer: Self-pay

## 2020-04-08 ENCOUNTER — Other Ambulatory Visit: Payer: Self-pay

## 2020-04-08 MED ORDER — LETROZOLE 2.5 MG PO TABS: 2.5000 mg | ORAL_TABLET | Freq: Every day | ORAL | 6 refills | Status: DC

## 2020-04-08 NOTE — Telephone Encounter (Signed)
Pt called with concern about Aromasin cost. Pt states it is over $100/mo and with GoodRx it will be $66/mo but this is still too expensive. Pt asks for advice on a different medication or if she needs it at all. Dr Jana Hakim ordered Letrozole 2.5 mg 1 tab PO QD w/6 refills. Pt verbalized thanks and understanding.

## 2020-04-29 ENCOUNTER — Telehealth: Payer: Self-pay

## 2020-04-29 DIAGNOSIS — E1169 Type 2 diabetes mellitus with other specified complication: Secondary | ICD-10-CM | POA: Diagnosis not present

## 2020-04-29 DIAGNOSIS — E7800 Pure hypercholesterolemia, unspecified: Secondary | ICD-10-CM | POA: Diagnosis not present

## 2020-04-29 DIAGNOSIS — I10 Essential (primary) hypertension: Secondary | ICD-10-CM | POA: Diagnosis not present

## 2020-04-29 DIAGNOSIS — C50919 Malignant neoplasm of unspecified site of unspecified female breast: Secondary | ICD-10-CM | POA: Diagnosis not present

## 2020-04-29 NOTE — Telephone Encounter (Signed)
Call placed to patient reference PREP referral. Have an evening class starting 05/20/20. Patient needs daytime class.  Advised will call back when starting next class during daytime.

## 2020-05-12 ENCOUNTER — Encounter: Payer: Self-pay | Admitting: Physical Medicine and Rehabilitation

## 2020-05-12 MED ORDER — METHYLPREDNISOLONE ACETATE 80 MG/ML IJ SUSP
80.0000 mg | INTRAMUSCULAR | Status: AC | PRN
  Administered 2019-05-29: 80 mg via INTRA_ARTICULAR

## 2020-05-12 MED ORDER — BUPIVACAINE HCL 0.5 % IJ SOLN
2.0000 mL | INTRAMUSCULAR | Status: AC | PRN
  Administered 2019-05-29: 2 mL via INTRA_ARTICULAR

## 2020-05-12 NOTE — Progress Notes (Signed)
BLOSSOM CRUME - 74 y.o. female MRN [id number]  Date of birth: 01/12/1946  Office Visit Note: Visit Date: 05/29/2019 PCP: Lawerance Cruel, MD Referred by: Lawerance Cruel, MD  Subjective: Chief Complaint  Patient presents with  . Lower Back - Pain   HPI: Lauren [name] is a 74 y.o. female who comes in today at the request of Dr. Jean Rosenthal for planned Right sacroiliac joint injection with fluoroscopic guidance.  The patient has failed conservative care including home exercise, medications, time and activity modification.  This injection will be diagnostic and hopefully therapeutic.  Please see requesting physician notes for further details and justification.   ROS Otherwise per HPI.  Assessment & Plan: Visit Diagnoses:  1. Sacroiliitis, not elsewhere classified ([place])     Plan: No additional findings.   Meds & Orders: No orders of the defined types were placed in this encounter.   Orders Placed This Encounter  Procedures  . Sacroiliac Joint Inj  . XR C-ARM NO REPORT    Follow-up: Return for visit to requesting physician as needed.   Procedures: Sacroiliac Joint Inj (Right) on 05/29/2019 11:24 AM Indications: pain and diagnostic evaluation Details: 22 G 3.5 in needle, fluoroscopy-guided posterior approach Medications: 2 mL bupivacaine 0.5 %; 80 mg methylPREDNISolone acetate 80 MG/ML Outcome: tolerated well, no immediate complications  There was excellent flow of contrast producing a partial arthrogram of the sacroiliac joint.  Procedure, treatment alternatives, risks and benefits explained, specific risks discussed. Consent was given by the patient. Immediately prior to procedure a time out was called to verify the correct patient, procedure, equipment, support staff and site/side marked as required. Patient was prepped and draped in the usual sterile fashion.      No notes on file   Clinical History: No specialty comments available.   She  reports that she quit smoking about 37 years ago. Her smoking use included cigarettes. She has a 10.00 pack-year smoking history. She has never used smokeless tobacco. No results for input(s): HGBA1C, LABURIC in the last 8760 hours.  Objective:  VS:  HT:    WT:   BMI:     BP:114/68  HR:79bpm  TEMP: ( )  RESP:  Physical Exam Constitutional:      General: She is not in acute distress.    Appearance: Normal appearance. She is not ill-appearing.  HENT:     Head: Normocephalic and atraumatic.     Right Ear: External ear normal.     Left Ear: External ear normal.  Eyes:     Extraocular Movements: Extraocular movements intact.  Cardiovascular:     Rate and Rhythm: Normal rate.     Pulses: Normal pulses.  Musculoskeletal:     Right lower leg: No edema.     Left lower leg: No edema.     Comments: Patient has good distal strength with no pain over the greater trochanters.  No clonus or focal weakness.  Positive Fortin finger sign on the right.  Skin:    Findings: No erythema, lesion or rash.  Neurological:     General: No focal deficit present.     Mental Status: She is alert and oriented to person, place, and time.     Sensory: No sensory deficit.     Motor: No weakness or abnormal muscle tone.     Coordination: Coordination normal.  Psychiatric:        Mood and Affect: Mood normal.        Behavior:  Behavior normal.     Ortho Exam  Imaging: No results found.  Past Medical/Family/Surgical/Social History: Medications & Allergies reviewed per EMR, new medications updated. Patient Active Problem List   Diagnosis Date Noted  . Coronary artery calcification 02/20/2020  . Pure hypercholesterolemia 02/20/2020  . Unilateral primary osteoarthritis, right knee 01/03/2019  . Genetic testing 12/07/2018  . Family history of breast cancer   . Family history of prostate cancer   . Family history of colon cancer   . Family history of kidney cancer   . Malignant neoplasm of upper-outer  quadrant of right breast in female, estrogen receptor positive ([place]) 11/09/2018  . Diabetic neuropathy, type II diabetes mellitus ([place]) 11/09/2018  . 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/02/2017  . Trochanteric bursitis, right hip 05/10/2017  . Primary osteoarthritis of left knee 03/09/2017  . Unilateral primary osteoarthritis, right hip 11/26/2016  . Status post total replacement of right hip 11/26/2016  . DDD (degenerative disc disease), 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  . Low back pain 04/23/2015  . Hiatal hernia 04/13/2013  . Type II or unspecified type diabetes mellitus without mention of complication, uncontrolled 01/26/2013  . Umbilical hernia 16/08/9603  . Essential hypertension    Past Medical History:  Diagnosis Date  . Arthritis    Back, Hip- right  . Cancer ([place])    right breast  . Cataract   . Coronary artery calcification 02/20/2020  . Diabetes mellitus without complication ([place])    Type II  . Endometriosis   . Family history of adverse reaction to anesthesia    Father - N/V - blood pressure; daughter N/V  . Family history of breast cancer   . Family history of colon cancer   . Family history of kidney cancer   . Family history of prostate cancer   . GERD (gastroesophageal reflux disease)   . History of kidney stones   . History of ulcerative colitis   . Hypertension   . Insomnia   . 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 Neuropathy   . Pneumonia    1983, 2003  . Pure hypercholesterolemia 02/20/2020  . PVCs (premature ventricular contractions)    benign  . Umbilical hernia   . Vitamin D deficiency   . Wears glasses    Family History  Problem Relation Age of Onset  . Hypertension Father   . Heart disease Father   . Diabetes Father   . Cancer Father        colon, prostate, and kidney  . Hypertension Mother   . Parkinson's disease Mother   . Heart disease Maternal Grandmother   . Diabetes Maternal Grandmother   . Heart disease Maternal Grandfather   .  Diabetes Maternal Grandfather   . Cancer Maternal Grandfather        pt unaware of what kind  . Stroke Paternal Grandmother   . Stroke Paternal Grandfather   . Colon cancer Paternal Aunt        dx less than 24  . Cancer Maternal Aunt        cervical vs ovarian  . Prostate cancer Paternal Uncle   . Prostate cancer Other        PGF's father  . Breast cancer Paternal Aunt        dx in her 15s  . Breast cancer Cousin        pat first cousin, dx over 13   Past Surgical History:  Procedure Laterality Date  . APPENDECTOMY    .  BREAST BIOPSY  08-13-2009  DR TSUEI   EXCISION LEFT NIPPLE DUCT  . BREAST EXCISIONAL BIOPSY Left    x2  . BREAST EXCISIONAL BIOPSY Right   . BREAST LUMPECTOMY Right 11/16/2018   invasive ductal  . CHOLECYSTECTOMY  1992  . COLON RESECTION  1986   ULCERATIVE COLITIS  . COLON SURGERY    . ESOPHAGEAL MANOMETRY N/A 06/11/2013   Procedure: ESOPHAGEAL MANOMETRY (EM);  Surgeon: Jeryl Columbia, MD;  [facility];  Service: Endoscopy;  Laterality: N/A;  . ESOPHAGOGASTRODUODENOSCOPY (EGD) WITH PROPOFOL N/A 05/31/2016   Procedure: ESOPHAGOGASTRODUODENOSCOPY (EGD) WITH PROPOFOL;  Surgeon: Clarene Essex, MD;  Location[facility] ENDOSCOPY;  Service: Endoscopy;  Laterality: N/A;  . EXCISION RIGHT BREAST MASS   10-20-2005  DR Marylene Buerger  . FRACTURE SURGERY     left arm  . kidney stone removed  2/11  . KNEE ARTHROSCOPY Left    MCL  . LEFT THUMB CARPOMETACARPAL JOINT SUSPENSIONPLASTY  04-06-2006  DR Burney Gauze  . LEFT WRIST ARTHROSCOPY W/ DEBRIDEMENT AND REMOVAL CYST  03-26-2004  DR Burney Gauze  . MYOMECTOMY    . RADIOACTIVE SEED GUIDED PARTIAL MASTECTOMY WITH AXILLARY SENTINEL LYMPH NODE BIOPSY Right 11/16/2018   Procedure: RIGHT BREAST RADIOACTIVE SEED GUIDED PARTIAL MASTECTOMY WITH  SENTINEL  NODE BIOPSY;  Surgeon: Coralie Keens, MD;  Location: [facility];  Service: General;  Laterality: Right;  . RIGHT COLECTOMY    . RIGHT URETEROSCOPIC STONE EXTRACTION  12-11-2009  DR Irine Seal  . TENDON RELEASE    . TONSILLECTOMY    . TOTAL HIP ARTHROPLASTY Right 11/26/2016   Procedure: RIGHT TOTAL HIP ARTHROPLASTY ANTERIOR APPROACH;  Surgeon: Mcarthur Rossetti, MD;  Location: [facility];  Service: Orthopedics;  Laterality: Right;  . TOTAL KNEE ARTHROPLASTY Left 12/02/2017   Procedure: LEFT TOTAL KNEE ARTHROPLASTY;  Surgeon: Mcarthur Rossetti, MD;  Location: [facility];  Service: Orthopedics;  Laterality: Left;  . WRIST SURGERY     both   Social History   Occupational History  . Not on file  Tobacco Use  . Smoking status: Former Smoker    Packs/day: 1.00    Years: 10.00    Pack years: 10.00    Types: Cigarettes    Quit date: 09/22/1982    Years since quitting: 37.6  . Smokeless tobacco: Never Used  . Tobacco comment: quit 1983  Vaping Use  . Vaping Use: Never used  Substance and Sexual Activity  . Alcohol use: No  . Drug use: No  . Sexual activity: Yes    Partners: Male    Birth control/protection: Post-menopausal

## 2020-05-13 DIAGNOSIS — M47816 Spondylosis without myelopathy or radiculopathy, lumbar region: Secondary | ICD-10-CM | POA: Diagnosis not present

## 2020-05-13 DIAGNOSIS — G894 Chronic pain syndrome: Secondary | ICD-10-CM | POA: Diagnosis not present

## 2020-05-13 DIAGNOSIS — Z79899 Other long term (current) drug therapy: Secondary | ICD-10-CM | POA: Diagnosis not present

## 2020-05-13 DIAGNOSIS — M150 Primary generalized (osteo)arthritis: Secondary | ICD-10-CM | POA: Diagnosis not present

## 2020-05-19 DIAGNOSIS — E7800 Pure hypercholesterolemia, unspecified: Secondary | ICD-10-CM | POA: Diagnosis not present

## 2020-05-19 DIAGNOSIS — I10 Essential (primary) hypertension: Secondary | ICD-10-CM | POA: Diagnosis not present

## 2020-05-19 DIAGNOSIS — C50919 Malignant neoplasm of unspecified site of unspecified female breast: Secondary | ICD-10-CM | POA: Diagnosis not present

## 2020-05-19 DIAGNOSIS — E1165 Type 2 diabetes mellitus with hyperglycemia: Secondary | ICD-10-CM | POA: Diagnosis not present

## 2020-05-19 DIAGNOSIS — E1169 Type 2 diabetes mellitus with other specified complication: Secondary | ICD-10-CM | POA: Diagnosis not present

## 2020-05-20 ENCOUNTER — Ambulatory Visit: Payer: PPO | Admitting: Orthopaedic Surgery

## 2020-05-20 ENCOUNTER — Ambulatory Visit (INDEPENDENT_AMBULATORY_CARE_PROVIDER_SITE_OTHER): Payer: PPO

## 2020-05-20 ENCOUNTER — Other Ambulatory Visit: Payer: Self-pay

## 2020-05-20 ENCOUNTER — Encounter: Payer: Self-pay | Admitting: Orthopaedic Surgery

## 2020-05-20 VITALS — Ht 64.0 in | Wt 220.0 lb

## 2020-05-20 DIAGNOSIS — M25562 Pain in left knee: Secondary | ICD-10-CM | POA: Diagnosis not present

## 2020-05-20 NOTE — Progress Notes (Signed)
Office Visit Note   Patient: Lauren [name]           Date of Birth: 04-03-1946           MRN: [id number] Visit Date: 05/20/2020              Requested by: Lawerance Cruel, [place],  [place] [address] PCP: Lawerance Cruel, MD   Assessment & Plan: Visit Diagnoses:  1. Acute pain of left knee     Plan: She will work on quad strengthening exercises as discussed today.  Questions were encouraged and answered by Dr. [name] and myself.  Patient was seen and examined by Dr. [name] and myself.  Follow-up with [place] in 6 weeks.  She may need polyexchange and upsizing the fixed bearing from 11 mm to 13 mm if she continues to have symptoms.  Follow-Up Instructions: Return in about 6 weeks (around 07/01/2020).   Orders:  Orders Placed This Encounter  Procedures  . XR KNEE 3 VIEW LEFT   No orders of the defined types were placed in this encounter.     Procedures: No procedures performed   Clinical Data: No additional findings.   Subjective: Chief Complaint  Patient presents with  . Left Knee - Pain    HPI Mrs. Lauren [name] comes in today with left knee pain that started 2 days ago.  She is currently having no pain in the knee.  She states that this past Sunday she was having pain in the knee and a catching-like sensation like something was floating around in the knee occasionally getting caught.  At this point time she is having no pain in the knee.  Pain is mostly medial aspect.  She denies any fevers, chills, knee swelling or injury to the knee.  Review of Systems Please see HPI otherwise negative  Objective: Vital Signs: Ht 5\' 4"  (1.626 m)   Wt 220 lb (99.8 kg)   BMI 37.76 kg/m   Physical Exam General: Well-developed well-nourished female no acute distress mood affect appropriate. Psych: Alert and oriented x3. Ortho Exam Bilateral knees no abnormal warmth erythema or effusion.  Left knee full extension full flexion.  Valgus varus stressing  there is no laxity with valgus but with varus stressing there is slight laxity.  Anterior drawer is negative.  Surgical incisions well-healed.  Specialty Comments:  No specialty comments available.  Imaging: XR KNEE 3 VIEW LEFT  Result Date: 05/20/2020 Left knee 2 views: No acute fracture.  Knee is well located.  Total knee components appear well seated.  No foreign bodies or obvious loose bodies.    PMFS History: Patient Active Problem List   Diagnosis Date Noted  . Coronary artery calcification 02/20/2020  . Pure hypercholesterolemia 02/20/2020  . Unilateral primary osteoarthritis, right knee 01/03/2019  . Genetic testing 12/07/2018  . Family history of breast cancer   . Family history of prostate cancer   . Family history of colon cancer   . Family history of kidney cancer   . Malignant neoplasm of upper-outer quadrant of right breast in female, estrogen receptor positive ([place]) 11/09/2018  . Diabetic neuropathy, type II diabetes mellitus ([place]) 11/09/2018  . 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/02/2017  . Trochanteric bursitis, right hip 05/10/2017  . Primary osteoarthritis of left knee 03/09/2017  . Unilateral primary osteoarthritis, right hip 11/26/2016  . Status post total replacement of right hip 11/26/2016  . DDD (degenerative disc disease), 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  . Low  back pain 04/23/2015  . Hiatal hernia 04/13/2013  . Type II or unspecified type diabetes mellitus without mention of complication, uncontrolled 01/26/2013  . Umbilical hernia 95/63/8756  . Essential hypertension    Past Medical History:  Diagnosis Date  . Arthritis    Back, Hip- right  . Cancer ([facility])    right breast  . Cataract   . Coronary artery calcification 02/20/2020  . Diabetes mellitus without complication ([place] [facility])    Type II  . Endometriosis   . Family history of adverse reaction to anesthesia    Father - N/V - blood pressure; daughter N/V  . Family history of breast cancer   .  Family history of colon cancer   . Family history of kidney cancer   . Family history of prostate cancer   . GERD (gastroesophageal reflux disease)   . History of kidney stones   . History of ulcerative colitis   . Hypertension   . Insomnia   . 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 Neuropathy   . Pneumonia    1983, 2003  . Pure hypercholesterolemia 02/20/2020  . PVCs (premature ventricular contractions)    benign  . Umbilical hernia   . Vitamin D deficiency   . Wears glasses     Family History  Problem Relation Age of Onset  . Hypertension Father   . Heart disease Father   . Diabetes Father   . Cancer Father        colon, prostate, and kidney  . Hypertension Mother   . Parkinson's disease Mother   . Heart disease Maternal Grandmother   . Diabetes Maternal Grandmother   . Heart disease Maternal Grandfather   . Diabetes Maternal Grandfather   . Cancer Maternal Grandfather        pt unaware of what kind  . Stroke Paternal Grandmother   . Stroke Paternal Grandfather   . Colon cancer Paternal Aunt        dx less than 50  . Cancer Maternal Aunt        cervical vs ovarian  . Prostate cancer Paternal Uncle   . Prostate cancer Other        PGF's father  . Breast cancer Paternal Aunt        dx in her 66s  . Breast cancer Cousin        pat first cousin, dx over 25    Past Surgical History:  Procedure Laterality Date  . APPENDECTOMY    . BREAST BIOPSY  08-13-2009  DR TSUEI   EXCISION LEFT NIPPLE DUCT  . BREAST EXCISIONAL BIOPSY Left    x2  . BREAST EXCISIONAL BIOPSY Right   . BREAST LUMPECTOMY Right 11/16/2018   invasive ductal  . CHOLECYSTECTOMY  1992  . COLON RESECTION  1986   ULCERATIVE COLITIS  . COLON SURGERY    . ESOPHAGEAL MANOMETRY N/A 06/11/2013   Procedure: ESOPHAGEAL MANOMETRY (EM);  Surgeon: Jeryl Columbia, MD;  Location: [facility];  Service: Endoscopy;  Laterality: N/A;  . ESOPHAGOGASTRODUODENOSCOPY (EGD) WITH PROPOFOL N/A 05/31/2016   Procedure:  ESOPHAGOGASTRODUODENOSCOPY (EGD) WITH PROPOFOL;  Surgeon: Clarene Essex, MD;  Location: [facility] ENDOSCOPY;  Service: Endoscopy;  Laterality: N/A;  . EXCISION RIGHT BREAST MASS   10-20-2005  DR Marylene Buerger  . FRACTURE SURGERY     left arm  . kidney stone removed  2/11  . KNEE ARTHROSCOPY Left    MCL  . LEFT THUMB CARPOMETACARPAL JOINT SUSPENSIONPLASTY  04-06-2006  DR Burney Gauze  . LEFT WRIST ARTHROSCOPY W/ DEBRIDEMENT AND REMOVAL CYST  03-26-2004  DR Burney Gauze  . MYOMECTOMY    . RADIOACTIVE SEED GUIDED PARTIAL MASTECTOMY WITH AXILLARY SENTINEL LYMPH NODE BIOPSY Right 11/16/2018   Procedure: RIGHT BREAST RADIOACTIVE SEED GUIDED PARTIAL MASTECTOMY WITH  SENTINEL  NODE BIOPSY;  Surgeon: Coralie Keens, MD;  Location: [facility];  Service: General;  Laterality: Right;  . RIGHT COLECTOMY    . RIGHT URETEROSCOPIC STONE EXTRACTION  12-11-2009  DR Irine Seal  . TENDON RELEASE    . TONSILLECTOMY    . TOTAL HIP ARTHROPLASTY Right 11/26/2016   Procedure: RIGHT TOTAL HIP ARTHROPLASTY ANTERIOR APPROACH;  Surgeon: Mcarthur Rossetti, MD;  Location: [facility];  Service: Orthopedics;  Laterality: Right;  . TOTAL KNEE ARTHROPLASTY Left 12/02/2017   Procedure: LEFT TOTAL KNEE ARTHROPLASTY;  Surgeon: Mcarthur Rossetti, MD;  Location: [facility];  Service: Orthopedics;  Laterality: Left;  . WRIST SURGERY     both   Social History   Occupational History  . Not on file  Tobacco Use  . Smoking status: Former Smoker    Packs/day: 1.00    Years: 10.00    Pack years: 10.00    Types: Cigarettes    Quit date: 09/22/1982    Years since quitting: 37.6  . Smokeless tobacco: Never Used  . Tobacco comment: quit 1983  Vaping Use  . Vaping Use: Never used  Substance and Sexual Activity  . Alcohol use: No  . Drug use: No  . Sexual activity: Yes    Partners: Male    Birth control/protection: Post-menopausal

## 2020-05-30 DIAGNOSIS — C50911 Malignant neoplasm of unspecified site of right female breast: Secondary | ICD-10-CM | POA: Diagnosis not present

## 2020-06-17 DIAGNOSIS — C50919 Malignant neoplasm of unspecified site of unspecified female breast: Secondary | ICD-10-CM | POA: Diagnosis not present

## 2020-06-17 DIAGNOSIS — I10 Essential (primary) hypertension: Secondary | ICD-10-CM | POA: Diagnosis not present

## 2020-06-17 DIAGNOSIS — E7800 Pure hypercholesterolemia, unspecified: Secondary | ICD-10-CM | POA: Diagnosis not present

## 2020-06-17 DIAGNOSIS — E1165 Type 2 diabetes mellitus with hyperglycemia: Secondary | ICD-10-CM | POA: Diagnosis not present

## 2020-06-17 DIAGNOSIS — E1169 Type 2 diabetes mellitus with other specified complication: Secondary | ICD-10-CM | POA: Diagnosis not present

## 2020-06-24 ENCOUNTER — Other Ambulatory Visit: Payer: Self-pay | Admitting: Surgery

## 2020-06-24 DIAGNOSIS — K429 Umbilical hernia without obstruction or gangrene: Secondary | ICD-10-CM | POA: Diagnosis not present

## 2020-06-26 ENCOUNTER — Telehealth: Payer: Self-pay | Admitting: *Deleted

## 2020-06-26 NOTE — Telephone Encounter (Signed)
Ms Timmons states she is having problems with Letrozole.  She has been taking for 2 1/2 months. " I am not sleeping well, tired all of the time and am gaining about 1 lb per week. I'm up to 230 pounds"   Would like to talk to Dr Jana Hakim about stopping letrozole. Unsure of what to do

## 2020-06-26 NOTE — Telephone Encounter (Signed)
Notified to stop the letrozole. Will discuss in 6 weeks at next office visit

## 2020-07-01 ENCOUNTER — Ambulatory Visit: Payer: PPO | Admitting: Orthopaedic Surgery

## 2020-07-08 DIAGNOSIS — M47816 Spondylosis without myelopathy or radiculopathy, lumbar region: Secondary | ICD-10-CM | POA: Diagnosis not present

## 2020-07-08 DIAGNOSIS — G894 Chronic pain syndrome: Secondary | ICD-10-CM | POA: Diagnosis not present

## 2020-07-08 DIAGNOSIS — Z79899 Other long term (current) drug therapy: Secondary | ICD-10-CM | POA: Diagnosis not present

## 2020-07-08 DIAGNOSIS — M150 Primary generalized (osteo)arthritis: Secondary | ICD-10-CM | POA: Diagnosis not present

## 2020-07-15 ENCOUNTER — Ambulatory Visit: Payer: PPO | Admitting: Orthopaedic Surgery

## 2020-07-15 ENCOUNTER — Encounter: Payer: Self-pay | Admitting: Orthopaedic Surgery

## 2020-07-15 DIAGNOSIS — M25562 Pain in left knee: Secondary | ICD-10-CM | POA: Diagnosis not present

## 2020-07-15 NOTE — Progress Notes (Signed)
HPI: Mrs. Sahota returns today follow-up of her left knee.  She states that her knee feels better.  She states she has not had any clicking in the longer.  She has been doing quad strengthening exercises.  She is having increased pain in the right knee which she has known tricompartmental osteoarthritis.  Review of systems see HPI otherwise negative or noncontributory.  Physical exam: Left knee full extension full flexion.  No instability valgus varus stressing.  Anterior drawer is negative.  Passive range of motion reveals some slight crepitus.  Surgical incisions well-healed.  Impression: Left knee acute pain resolved History of left total knee arthroplasty 12/02/2017 Right knee tricompartmental osteoarthritis  Plan: Recommend she continue to work on quad strength.  In regards to her right knee she did undergo cortisone injection in the future.  She would like to hold off on this that she is given SI joints injected today.  Questions were encouraged and answered at length.  She will follow-up with [place] as needed.

## 2020-07-16 DIAGNOSIS — C50919 Malignant neoplasm of unspecified site of unspecified female breast: Secondary | ICD-10-CM | POA: Diagnosis not present

## 2020-07-16 DIAGNOSIS — E7800 Pure hypercholesterolemia, unspecified: Secondary | ICD-10-CM | POA: Diagnosis not present

## 2020-07-16 DIAGNOSIS — I10 Essential (primary) hypertension: Secondary | ICD-10-CM | POA: Diagnosis not present

## 2020-07-16 DIAGNOSIS — E1165 Type 2 diabetes mellitus with hyperglycemia: Secondary | ICD-10-CM | POA: Diagnosis not present

## 2020-07-16 DIAGNOSIS — E1169 Type 2 diabetes mellitus with other specified complication: Secondary | ICD-10-CM | POA: Diagnosis not present

## 2020-07-31 ENCOUNTER — Telehealth: Payer: Self-pay

## 2020-07-31 NOTE — Telephone Encounter (Signed)
   [facility] Pre-operative Risk Assessment    Request for surgical clearance:  1. What type of surgery is being performed? SI JOINT INJECTION   2. When is this surgery scheduled? 08-06-2020   3. What type of clearance is required (medical clearance vs. Pharmacy clearance to hold med vs. Both)? MEDICATION  4. Are there any medications that need to be held prior to surgery and how long? ASA 81MG   5. Practice name and name of physician performing surgery? [facility] ATTN:[facility]OR   6. What is the office phone number? [phone]   7.   What is the office fax number? [phone]  8.   Anesthesia type (None, local, MAC, general) ? LOCAL

## 2020-07-31 NOTE — Telephone Encounter (Signed)
   Primary Cardiologist: Skeet Latch, MD  Chart reviewed as part of pre-operative protocol coverage. Patient was contacted 07/31/2020 in reference to pre-operative risk assessment for pending surgery as outlined below.  Lauren [name] was last seen on 02/18/20 by Dr. Oval Linsey with history of coronary calcification, breast CA, arthritis, DM, GERD, HTN, HLD, PVCs. Stress test 2019 was low risk. I reached out to patient for update on how she is doing. The patient affirms she has been doing well without any new cardiac symptoms.  Therefore, based on ACC/AHA guidelines, the patient would be at acceptable risk for the planned procedure without further cardiovascular testing. The patient was advised that if she develops new symptoms prior to surgery to contact our office to arrange for a follow-up visit, and she verbalized understanding.  She denies a history of prior PCI, CABG, MI, TIA or stroke therefore OK to hold aspirin if needed for procedure. She indicates she has already been given instructions on when to begin holding since procedure is coming up, was just waiting for the OK from our office.  I will route this recommendation to the requesting party via Epic fax function and remove from pre-op pool. Please call with questions.  Charlie Pitter, PA-C 07/31/2020, 4:13 PM

## 2020-08-04 NOTE — Progress Notes (Signed)
[place]  [phone] [phone]     ID: Lauren [name] DOB: [birth year]  MR#: [id number]  XLK#:[id number]  Patient Care Team: Lawerance Cruel, MD as PCP - General (Family Medicine) Skeet Latch, MD as PCP - Cardiology (Cardiology) Mcarthur Rossetti, MD as Consulting Physician (Orthopedic Surgery) Tyson Masin, Virgie Dad, MD as Consulting Physician (Oncology) Kyung Rudd, MD as Consulting Physician (Radiation Oncology) Huel Cote, NP (Inactive) as Nurse Practitioner (Obstetrics and Gynecology) Clarene Essex, MD as Consulting Physician (Gastroenterology) Coralie Keens, MD as Consulting Physician (General Surgery) OTHER MD:   CHIEF COMPLAINT: Estrogen receptor positive breast cancer  CURRENT TREATMENT: Observation   INTERVAL HISTORY: Lauren [name] returns today for follow-up of her estrogen receptor positive breast cancer, accompanied by her husband.  After trying anastrozole, which caused her significant arthralgias and myalgias, she was switched to exemestane at her last visit on 04/03/2020.  However she was unable to afford this medication which caused her more than $100 a month.  She was then started on letrozole.  She took this for about 2-1/10month.  She felt terrible with that she had no energy.  She gained weight.  She has been off it now for about 2 months and is feeling more like herself.  Her most recent bone density screening on 01/07/2020 showed a T-score of -1.7, which is considered osteopenic.  She is up to date on mammography, with her most recent on 10/08/2019 at [facility]   REVIEW OF SYSTEMS: Lauren [name].  They do walk around the backyard with her to [employer] their new puppy.  She has had no unusual headaches, visual changes, cough, phlegm production, pleurisy, or shortness of breath.  A detailed review of systems today was otherwise stable.   HISTORY OF CURRENT ILLNESS: From  the original intake note:  Lauren [name] had routine screening mammography on 10/04/2018 showing a possible abnormality in the right breast. She underwent unilateral right diagnostic mammography with tomography and [facility] showing: Breast Density Category C. Spot compression tomosynthesis images through the upper outer quadrant of the right breast demonstrates a persistent irregular mass with indistinct margins measuring approximately 1 cm. On physical exam, no definite palpable mass is identified in the upper-outer right breast. Targeted ultrasound is performed, showing an irregular hypoechoic shadowing mass at 10:30 oclock, 6 cm from the nipple measuring 9 x 8 x 9 mm. Ultrasound of the right axilla demonstrates multiple normal-appearing lymph nodes.  Accordingly on 10/18/2018 she proceeded to biopsy of the right breast area in question. The pathology from this procedure showed ([id number]): invasive ductal carcinoma, grade II. Prognostic indicators significant for: estrogen receptor, 100% positive and progesterone receptor, 20% positive, both with strong staining intensity. Proliferation marker Ki67 at 5%. HER2 equivocal (2+) by immunohistochemisty but negative by fluorescent in situ hybridization with a signals ratio 1.26 and number per cell 2.40.   The patient's subsequent history is as detailed below.   PAST MEDICAL HISTORY: Past Medical History:  Diagnosis Date  . Arthritis    Back, Hip- right  . Cancer [place]    right breast  . Cataract   . Coronary artery calcification 02/20/2020  . 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 Endometriosis   . Family history of adverse reaction to anesthesia    Father - N/V - blood pressure; daughter N/V  . Family history of breast cancer   . Family history of  colon cancer   . Family history of kidney cancer   . Family history of prostate cancer   . GERD (gastroesophageal reflux  disease)   . History of kidney stones   . History of ulcerative colitis   . Hypertension   . Insomnia   . 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 Neuropathy   . Pneumonia    1983, 2003  . Pure hypercholesterolemia 02/20/2020  . PVCs (premature ventricular contractions)    benign  . Umbilical hernia   . Vitamin D deficiency   . Wears glasses     PAST SURGICAL HISTORY: Past Surgical History:  Procedure Laterality Date  . APPENDECTOMY    . BREAST BIOPSY  08-13-2009  DR TSUEI   EXCISION LEFT NIPPLE DUCT  . BREAST EXCISIONAL BIOPSY Left    x2  . BREAST EXCISIONAL BIOPSY Right   . BREAST LUMPECTOMY Right 11/16/2018   invasive ductal  . CHOLECYSTECTOMY  1992  . COLON RESECTION  1986   ULCERATIVE COLITIS  . COLON SURGERY    . ESOPHAGEAL MANOMETRY N/A 06/11/2013   Procedure: ESOPHAGEAL MANOMETRY (EM);  Surgeon: Jeryl Columbia, MD;  Location: [facility];  Service: Endoscopy;  Laterality: N/A;  . ESOPHAGOGASTRODUODENOSCOPY (EGD) WITH PROPOFOL N/A 05/31/2016   Procedure: ESOPHAGOGASTRODUODENOSCOPY (EGD) WITH PROPOFOL;  Surgeon: Clarene Essex, MD;  Location: [facility] ENDOSCOPY;  Service: Endoscopy;  Laterality: N/A;  . EXCISION RIGHT BREAST MASS   10-20-2005  DR Marylene Buerger  . FRACTURE SURGERY     left arm  . kidney stone removed  2/11  . KNEE ARTHROSCOPY Left    MCL  . LEFT THUMB CARPOMETACARPAL JOINT SUSPENSIONPLASTY  04-06-2006  DR Burney Gauze  . LEFT WRIST ARTHROSCOPY W/ DEBRIDEMENT AND REMOVAL CYST  03-26-2004  DR Burney Gauze  . MYOMECTOMY    . RADIOACTIVE SEED GUIDED PARTIAL MASTECTOMY WITH AXILLARY SENTINEL LYMPH NODE BIOPSY Right 11/16/2018   Procedure: RIGHT BREAST RADIOACTIVE SEED GUIDED PARTIAL MASTECTOMY WITH  SENTINEL  NODE BIOPSY;  Surgeon: Coralie Keens, MD;  Location: [facility];  Service: General;  Laterality: Right;  . RIGHT COLECTOMY    . RIGHT URETEROSCOPIC STONE EXTRACTION  12-11-2009  DR Irine Seal  . TENDON RELEASE    . TONSILLECTOMY    . TOTAL HIP ARTHROPLASTY Right 11/26/2016    Procedure: RIGHT TOTAL HIP ARTHROPLASTY ANTERIOR APPROACH;  Surgeon: Mcarthur Rossetti, MD;  Location: [facility];  Service: Orthopedics;  Laterality: Right;  . TOTAL KNEE ARTHROPLASTY Left 12/02/2017   Procedure: LEFT TOTAL KNEE ARTHROPLASTY;  Surgeon: Mcarthur Rossetti, MD;  Location: [facility];  Service: Orthopedics;  Laterality: Left;  . WRIST SURGERY     both    FAMILY HISTORY: Family History  Problem Relation Age of Onset  . Hypertension Father   . Heart disease Father   . Diabetes Father   . Cancer Father        colon, prostate, and kidney  . Hypertension Mother   . Parkinson's disease Mother   . Heart disease Maternal Grandmother   . Diabetes Maternal Grandmother   . Heart disease Maternal Grandfather   . Diabetes Maternal Grandfather   . Cancer Maternal Grandfather        pt unaware of what kind  . Stroke Paternal Grandmother   . Stroke Paternal Grandfather   . Colon cancer Paternal Aunt        dx less than 34  . Cancer Maternal Aunt        cervical vs  ovarian  . Prostate cancer Paternal Uncle   . Prostate cancer Other        PGF's father  . Breast cancer Paternal Aunt        dx in her 32s  . Breast cancer Cousin        pat first cousin, dx over 52   Cindy's father died from colon cancer at age 75; he was diagnosed with prostate cancer at 67, and he also had kidney cancer. Patients' mother died from failure to thrive at age 73. The patient has no siblings. Lauren [name] had a paternal aunt that was diagnosed with breast cancer in her mid 41's. Lauren [name] also has a paternal great grandfather that was diagnosed with prostate cancer. Patient denies anyone in her family having ovarian cancer.    GYNECOLOGIC HISTORY:  No LMP recorded. Patient is postmenopausal. 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 Her husband, [name], ran a Building surveyor. Together, they have two children, Richard and [employer]. Their son, Delfino Lovett, lives in [place] and is disabled (MS). Their daughter, [name], is a Physiological scientist (and also has MS). Lauren [name] has 5 grandchildren, no great grandchildren. She attends a [employer].    ADVANCED DIRECTIVES: In the absence of any documentation to the contrary, the patient's spouse is their HCPOA.    HEALTH MAINTENANCE: Social History   Tobacco Use  . Smoking status: Former Smoker    Packs/day: 1.00    Years: 10.00    Pack years: 10.00    Types: Cigarettes    Quit date: 09/22/1982    Years since quitting: 37.8  . Smokeless tobacco: Never Used  . Tobacco comment: quit 1983  Vaping Use  . Vaping Use: Never used  Substance Use Topics  . Alcohol use: No  . Drug use: No    Colonoscopy: yes, 3 or 4 years ago  PAP: Stopped at 58; [facility] Alas, [facility]  Bone density: 12/2019, -1.7  Allergies  Allergen Reactions  . Ciprofloxacin Hcl Hives  . Percocet [Oxycodone-Acetaminophen] Other (See Comments)    RESPIRATORY DISTRESS  . Penicillins Hives and Other (See Comments)    Has patient had a PCN reaction causing immediate rash, facial/tongue/throat swelling, SOB or lightheadedness with hypotension: no Has patient had a PCN reaction causing severe rash involving mucus membranes or skin necrosis: no Has patient had a PCN reaction that required hospitalization no Has patient had a PCN reaction occurring within the last 10 years: no If all of the above answers are "NO", then may proceed with Cephalosporin use.  . Sulfa Antibiotics Hives  . Tape Other (See Comments)    Adhesives - Causes Blisters. No band aids, and plastic tape   . Tetanus Toxoids Hives and Rash  . Jardiance [Empagliflozin]     Current Outpatient Medications  Medication Sig Dispense Refill  . amLODipine (NORVASC) 5 MG  tablet TAKE 1 TABLET BY MOUTH ONCE DAILY *NEED  OFFICE  VISIT* (Patient taking differently: Take 5 mg by mouth daily. ) 90 tablet 3  . aspirin EC 81 MG tablet Take 81 mg by mouth daily.    Marland Kitchen b complex vitamins tablet Take 1 tablet by mouth daily.    . balsalazide (COLAZAL) 750 MG capsule Take 2,250 mg  by mouth 3 (three) times daily.     . Cholecalciferol (VITAMIN D3) 50 MCG (2000 UT) TABS Take 2 tablets by mouth every morning.    . cholestyramine (QUESTRAN) 4 GM/DOSE powder Take 4 g by mouth daily.     . DULoxetine (CYMBALTA) 20 MG capsule Take 20 mg by mouth daily.    Marland Kitchen exemestane (AROMASIN) 25 MG tablet Take 1 tablet (25 mg total) by mouth daily after breakfast. 90 tablet 4  . furosemide (LASIX) 20 MG tablet Take 20 mg by mouth daily.     Marland Kitchen gabapentin (NEURONTIN) 300 MG capsule Take 600 mg by mouth at bedtime.   1  . HYDROcodone-acetaminophen (NORCO) 10-325 MG tablet Take by mouth.    . Lancets (ONETOUCH DELICA PLUS [id number]) Gerty     . letrozole (FEMARA) 2.5 MG tablet Take 1 tablet (2.5 mg total) by mouth daily. 60 tablet 6  . lidocaine (ASPERCREME W/LIDOCAINE) 4 % cream Apply 1 application topically daily as needed (for joint pain).     . metFORMIN (GLUCOPHAGE-XR) 500 MG 24 hr tablet Take 1,000 mg by mouth 2 (two) times daily.     . methocarbamol (ROBAXIN) 500 MG tablet Take 1 tablet (500 mg total) by mouth every 6 (six) hours as needed for muscle spasms. (Patient taking differently: Take 500 mg by mouth at bedtime. ) 60 tablet 0  . ONETOUCH VERIO test strip     . Oxymetazoline HCl (NASAL SPRAY NA) Place 1-2 sprays into both nostrils at bedtime as needed (for allergies).    . pantoprazole (PROTONIX) 40 MG tablet Take 40 mg by mouth 2 (two) times daily.     . pioglitazone (ACTOS) 15 MG tablet Take 15 mg by mouth daily.    . quinapril (ACCUPRIL) 40 MG tablet Take 40 mg by mouth daily.    . rosuvastatin (CRESTOR) 20 MG tablet TAKE 1 TABLET BY MOUTH EVERY DAY (Patient taking differently: Take  20 mg by mouth daily. ) 90 tablet 1   No current facility-administered medications for this visit.    OBJECTIVE:  white woman who appears stated age  82:   08/05/20 1200  BP: (!) 132/56  Pulse: 72  Resp: 18  Temp: (!) 97.3 F (36.3 C)  SpO2: 98%     Body mass index is 39.67 kg/m.   Wt Readings from Last 3 Encounters:  08/05/20 231 lb 1.6 oz (104.8 kg)  05/20/20 220 lb (99.8 kg)  02/18/20 226 lb (102.5 kg)      ECOG FS:2 - Symptomatic, <50% confined to bed  Sclerae unicteric, EOMs intact Wearing a mask No cervical or supraclavicular adenopathy Lungs no rales or rhonchi Heart regular rate and rhythm Abd soft, obese, nontender, positive bowel sounds MSK no focal spinal tenderness Neuro: nonfocal, well oriented, appropriate affect Breasts: The right breast is status post lumpectomy and radiation.  The cosmetic result is good.  There is no evidence of local recurrence.  The left breast is benign.  Both axillae are benign.  LAB RESULTS:  CMP     Component Value Date/Time   NA 139 02/04/2020 1212   K 4.2 02/04/2020 1212   CL 104 02/04/2020 1212   CO2 25 02/04/2020 1212   GLUCOSE 175 (H) 02/04/2020 1212   BUN 11 02/04/2020 1212   CREATININE 0.89 02/04/2020 1212   CREATININE 0.96 11/09/2018 1405   CALCIUM 9.2 02/04/2020 1212   PROT 7.0 02/04/2020 1212   ALBUMIN 3.8 02/04/2020 1212   AST 17 02/04/2020 1212  AST 34 11/09/2018 1405   ALT 14 02/04/2020 1212   ALT 24 11/09/2018 1405   ALKPHOS 62 02/04/2020 1212   BILITOT 0.3 02/04/2020 1212   BILITOT 0.4 11/09/2018 1405   GFRNONAA >60 02/04/2020 1212   GFRNONAA 59 (L) 11/09/2018 1405   GFRAA >60 02/04/2020 1212   GFRAA >60 11/09/2018 1405    No results found for: TOTALPROTELP, ALBUMINELP, A1GS, A2GS, BETS, BETA2SER, GAMS, MSPIKE, SPEI  No results found for: KPAFRELGTCHN, LAMBDASER, KAPLAMBRATIO  Lab Results  Component Value Date   WBC 7.1 08/05/2020   NEUTROABS 4.6 08/05/2020   HGB 11.1 (L) 08/05/2020    HCT 35.6 (L) 08/05/2020   MCV 88.1 08/05/2020   PLT 258 08/05/2020   No results found for: LABCA2  No components found for: [id number]  No results for input(s): INR in the last 168 hours.  No results found for: LABCA2  No results found for: PXT062  No results found for: IRS854  No results found for: OEV035  No results found for: CA2729  No components found for: HGQUANT  No results found for: CEA1 / No results found for: CEA1   No results found for: AFPTUMOR  No results found for: CHROMOGRNA  No results found for: HGBA, HGBA2QUANT, HGBFQUANT, HGBSQUAN (Hemoglobinopathy evaluation)   No results found for: LDH  No results found for: IRON, TIBC, IRONPCTSAT (Iron and TIBC)  No results found for: FERRITIN  Urinalysis    Component Value Date/Time   COLORURINE YELLOW 09/19/2018 [place] 09/19/2018 1143   LABSPEC 1.010 09/19/2018 1143   PHURINE 6.0 09/19/2018 1143   GLUCOSEU 3+ (A) 09/19/2018 1143   HGBUR TRACE (A) 09/19/2018 1143   BILIRUBINUR NEGATIVE 11/05/2015 [place] 09/19/2018 1143   PROTEINUR NEGATIVE 09/19/2018 1143   UROBILINOGEN 0.2 11/28/2013 1456   NITRITE NEGATIVE 11/05/2015 1512   LEUKOCYTESUR 1+ (A) 11/05/2015 1512    STUDIES:  No results found.   ELIGIBLE FOR AVAILABLE RESEARCH PROTOCOL: no   ASSESSMENT: 74 y.o. [place], [place] woman status post right breast upper outer quadrant biopsy 10/18/2018 for a clinical T1b N0, stage IA invasive ductal carcinoma, grade 2, estrogen and progesterone receptor positive, MIB-1 5%, with no HER-2 amplification by FISH  (1) right lumpectomy and sentinel lymph node sampling 11/16/2018 showed a pT1c pN0, stage IA invasive ductal carcinoma, grade 2, with negative margins  (a) a single sentinel lymph node was removed  (2) Oncotype score of 12 predicted a risk of recurrence outside the breast over the next 9 years of only 3% if the patient's only systemic treatment was antiestrogens  for 5 years.  No significant benefit was anticipated from chemotherapy.  (a) Oncotype also read tumor is HER-2 negative  (3) adjuvant radiation 01/02/2019 to 01/29/2019. Site/dose:   The patient initially received a dose of 42.56 Gy in 16 fractions to the right breast using whole-breast tangent fields. This was delivered using a 3-D conformal technique. The patient then received a boost. This delivered an additional 8 Gy in 4 fractions using a 3 field photon technique. The total dose was 50.56 Gy.  (4) anastrozole started 02/13/2019  (a) bone density 01/06/2012 was normal  (b) anastrozole discontinued April 2021, with arthralgias/myalgias  (5) genetics testing 11/27/2018 through the Multi-Gene Panel offered by [facility] found no deleterious mutations in AIP, ALK, APC, ATM, AXIN2,BAP1,  BARD1, BLM, BMPR1A, BRCA1, BRCA2, BRIP1, CASR, CDC73, CDH1, CDK4, CDKN1B, CDKN1C, CDKN2A (p14ARF), CDKN2A (p16INK4a), CEBPA, CHEK2, CTNNA1, DICER1, DIS3L2, EGFR (c.2369C>T, p.Thr790Met  variant only), EPCAM (Deletion/duplication testing only), FH, FLCN, GATA2, GPC3, GREM1 (Promoter region deletion/duplication testing only), HOXB13 (c.251G>A, p.Gly84Glu), HRAS, KIT, MAX, MEN1, MET, MITF (c.952G>A, p.Glu318Lys variant only), MLH1, MSH2, MSH3, MSH6, MUTYH, NBN, NF1, NF2, NTHL1, PALB2, PDGFRA, PHOX2B, PMS2, POLD1, POLE, POT1, PRKAR1A, PTCH1, PTEN, RAD50, RAD51C, RAD51D, RB1, RECQL4, RET, RUNX1, SDHAF2, SDHA (sequence changes only), SDHB, SDHC, SDHD, SMAD4, SMARCA4, SMARCB1, SMARCE1, STK11, SUFU, TERC, TERT, TMEM127, TP53, TSC1, TSC2, VHL, WRN and WT1.    (a) a variant of uncertain significant was found in DICER1, c.2118C>T (silent)  (6) exemestane prescribed 04/03/2020--could not obtain secondary to cost  (a) started letrozole Jun 2021, discontinued because of intolerable side effects   PLAN: Lauren [name] had a small node-negative estrogen receptor positive tumor with a very low Oncotype score.  In general her prognosis is good  even without antiestrogens.  Of course antiestrogens would make the prognosis even better and that is the reason we have tried them.  She gave anastrozole and letrozole a good try.  She really was not able to tolerate them.  She was not able to obtain the exemestane because of cost issues.  Today we discussed tamoxifen.  She is appropriately deterred by concerns regarding blood clots.  This is a potentially life-threatening complication in someone like her who is on the overweight side and not as ambulatory as I would like.  Given all this and taking it altogether I think the best plan here is to for continued observation off antiestrogens.  If exemestane becomes available from an economic point of view at some point we could certainly try that although she is likely to have the same side effect that she had with the other aromatase inhibitors.  She will see her primary care physician in December and she sees her cardiologist in February.  She is going to see me again in August and we will see her yearly thereafter  She knows to call for any other issue that may develop before then  Total encounter time 25 minutes.Sarajane Jews C. Lamekia Nolden, MD  Medical Oncology and Hematology [facility] [place], [place] [address] Tel. [phone]    Fax. [phone]    I, Wilburn Mylar, am acting as scribe for Dr. Virgie Dad. Piper Albro.  I, Lurline Del MD, have reviewed the above documentation for accuracy and completeness, and I agree with the above.   *Total Encounter Time as defined by the Centers for Medicare and Medicaid Services includes, in addition to the face-to-face time of a patient visit (documented in the note above) non-face-to-face time: obtaining and reviewing outside history, ordering and reviewing medications, tests or procedures, care coordination (communications with other health care professionals or caregivers) and documentation in the medical record.

## 2020-08-05 ENCOUNTER — Inpatient Hospital Stay: Payer: PPO | Attending: Oncology

## 2020-08-05 ENCOUNTER — Other Ambulatory Visit: Payer: Self-pay

## 2020-08-05 ENCOUNTER — Inpatient Hospital Stay (HOSPITAL_BASED_OUTPATIENT_CLINIC_OR_DEPARTMENT_OTHER): Payer: PPO | Admitting: Oncology

## 2020-08-05 VITALS — BP 132/56 | HR 72 | Temp 97.3°F | Resp 18 | Ht 64.0 in | Wt 231.1 lb

## 2020-08-05 DIAGNOSIS — I10 Essential (primary) hypertension: Secondary | ICD-10-CM | POA: Insufficient documentation

## 2020-08-05 DIAGNOSIS — Z170 Estrogen receptor positive status [ER+]: Secondary | ICD-10-CM

## 2020-08-05 DIAGNOSIS — C50411 Malignant neoplasm of upper-outer quadrant of right female breast: Secondary | ICD-10-CM

## 2020-08-05 DIAGNOSIS — Z7984 Long term (current) use of oral hypoglycemic drugs: Secondary | ICD-10-CM | POA: Insufficient documentation

## 2020-08-05 DIAGNOSIS — M791 Myalgia, unspecified site: Secondary | ICD-10-CM | POA: Diagnosis not present

## 2020-08-05 DIAGNOSIS — E559 Vitamin D deficiency, unspecified: Secondary | ICD-10-CM | POA: Insufficient documentation

## 2020-08-05 DIAGNOSIS — Z923 Personal history of irradiation: Secondary | ICD-10-CM | POA: Diagnosis not present

## 2020-08-05 DIAGNOSIS — K219 Gastro-esophageal reflux disease without esophagitis: Secondary | ICD-10-CM | POA: Insufficient documentation

## 2020-08-05 DIAGNOSIS — Z79899 Other long term (current) drug therapy: Secondary | ICD-10-CM | POA: Insufficient documentation

## 2020-08-05 DIAGNOSIS — Z9011 Acquired absence of right breast and nipple: Secondary | ICD-10-CM | POA: Diagnosis not present

## 2020-08-05 DIAGNOSIS — M2550 Pain in unspecified joint: Secondary | ICD-10-CM | POA: Diagnosis not present

## 2020-08-05 DIAGNOSIS — I2510 Atherosclerotic heart disease of native coronary artery without angina pectoris: Secondary | ICD-10-CM | POA: Insufficient documentation

## 2020-08-05 DIAGNOSIS — E7800 Pure hypercholesterolemia, unspecified: Secondary | ICD-10-CM | POA: Diagnosis not present

## 2020-08-05 DIAGNOSIS — E1136 Type 2 diabetes mellitus with diabetic cataract: Secondary | ICD-10-CM | POA: Insufficient documentation

## 2020-08-05 DIAGNOSIS — M1990 Unspecified osteoarthritis, unspecified site: Secondary | ICD-10-CM | POA: Insufficient documentation

## 2020-08-05 DIAGNOSIS — Z7982 Long term (current) use of aspirin: Secondary | ICD-10-CM | POA: Insufficient documentation

## 2020-08-05 DIAGNOSIS — Z87891 Personal history of nicotine dependence: Secondary | ICD-10-CM | POA: Diagnosis not present

## 2020-08-05 LAB — COMPREHENSIVE METABOLIC PANEL
ALT: 15 U/L (ref 0–44)
AST: 23 U/L (ref 15–41)
Albumin: 3.6 g/dL (ref 3.5–5.0)
Alkaline Phosphatase: 49 U/L (ref 38–126)
Anion gap: 5 (ref 5–15)
BUN: 11 mg/dL (ref 8–23)
CO2: 30 mmol/L (ref 22–32)
Calcium: 9.4 mg/dL (ref 8.9–10.3)
Chloride: 105 mmol/L (ref 98–111)
Creatinine, Ser: 0.84 mg/dL (ref 0.44–1.00)
GFR calc non Af Amer: >60 mL/min (ref >60)
Glucose, Bld: 200 mg/dL — ABNORMAL HIGH (ref 70–99)
Potassium: 4.1 mmol/L (ref 3.5–5.1)
Sodium: 140 mmol/L (ref 135–145)
Total Bilirubin: 0.3 mg/dL (ref 0.3–1.2)
Total Protein: 6.7 g/dL (ref 6.5–8.1)

## 2020-08-05 LAB — CBC WITH DIFFERENTIAL/PLATELET
Abs Immature Granulocytes: 0.03 10*3/uL (ref 0.00–0.07)
Basophils Absolute: 0 10*3/uL (ref 0.0–0.1)
Basophils Relative: 0 %
Eosinophils Absolute: 0.3 10*3/uL (ref 0.0–0.5)
Eosinophils Relative: 4 %
HCT: 35.6 % — ABNORMAL LOW (ref 36.0–46.0)
Hemoglobin: 11.1 g/dL — ABNORMAL LOW (ref 12.0–15.0)
Immature Granulocytes: 0 %
Lymphocytes Relative: 21 %
Lymphs Abs: 1.5 10*3/uL (ref 0.7–4.0)
MCH: 27.5 pg (ref 26.0–34.0)
MCHC: 31.2 g/dL (ref 30.0–36.0)
MCV: 88.1 fL (ref 80.0–100.0)
Monocytes Absolute: 0.7 10*3/uL (ref 0.1–1.0)
Monocytes Relative: 10 %
Neutro Abs: 4.6 10*3/uL (ref 1.7–7.7)
Neutrophils Relative %: 65 %
Platelets: 258 10*3/uL (ref 150–400)
RBC: 4.04 MIL/uL (ref 3.87–5.11)
RDW: 14.7 % (ref 11.5–15.5)
WBC: 7.1 10*3/uL (ref 4.0–10.5)
nRBC: 0 % (ref 0.0–0.2)

## 2020-08-06 DIAGNOSIS — G894 Chronic pain syndrome: Secondary | ICD-10-CM | POA: Diagnosis not present

## 2020-08-06 DIAGNOSIS — M47818 Spondylosis without myelopathy or radiculopathy, sacral and sacrococcygeal region: Secondary | ICD-10-CM | POA: Diagnosis not present

## 2020-08-06 DIAGNOSIS — M150 Primary generalized (osteo)arthritis: Secondary | ICD-10-CM | POA: Diagnosis not present

## 2020-08-06 DIAGNOSIS — M47816 Spondylosis without myelopathy or radiculopathy, lumbar region: Secondary | ICD-10-CM | POA: Diagnosis not present

## 2020-08-15 DIAGNOSIS — I10 Essential (primary) hypertension: Secondary | ICD-10-CM | POA: Diagnosis not present

## 2020-08-15 DIAGNOSIS — E7800 Pure hypercholesterolemia, unspecified: Secondary | ICD-10-CM | POA: Diagnosis not present

## 2020-08-15 DIAGNOSIS — E1169 Type 2 diabetes mellitus with other specified complication: Secondary | ICD-10-CM | POA: Diagnosis not present

## 2020-08-15 DIAGNOSIS — E1165 Type 2 diabetes mellitus with hyperglycemia: Secondary | ICD-10-CM | POA: Diagnosis not present

## 2020-08-15 DIAGNOSIS — C50919 Malignant neoplasm of unspecified site of unspecified female breast: Secondary | ICD-10-CM | POA: Diagnosis not present

## 2020-09-02 DIAGNOSIS — M47816 Spondylosis without myelopathy or radiculopathy, lumbar region: Secondary | ICD-10-CM | POA: Diagnosis not present

## 2020-09-02 DIAGNOSIS — Z79899 Other long term (current) drug therapy: Secondary | ICD-10-CM | POA: Diagnosis not present

## 2020-09-02 DIAGNOSIS — M150 Primary generalized (osteo)arthritis: Secondary | ICD-10-CM | POA: Diagnosis not present

## 2020-09-02 DIAGNOSIS — Z79891 Long term (current) use of opiate analgesic: Secondary | ICD-10-CM | POA: Diagnosis not present

## 2020-09-02 DIAGNOSIS — G894 Chronic pain syndrome: Secondary | ICD-10-CM | POA: Diagnosis not present

## 2020-09-04 ENCOUNTER — Other Ambulatory Visit: Payer: Self-pay | Admitting: Oncology

## 2020-09-04 DIAGNOSIS — Z9889 Other specified postprocedural states: Secondary | ICD-10-CM

## 2020-09-15 DIAGNOSIS — C50919 Malignant neoplasm of unspecified site of unspecified female breast: Secondary | ICD-10-CM | POA: Diagnosis not present

## 2020-09-15 DIAGNOSIS — E1165 Type 2 diabetes mellitus with hyperglycemia: Secondary | ICD-10-CM | POA: Diagnosis not present

## 2020-09-15 DIAGNOSIS — I10 Essential (primary) hypertension: Secondary | ICD-10-CM | POA: Diagnosis not present

## 2020-09-15 DIAGNOSIS — K219 Gastro-esophageal reflux disease without esophagitis: Secondary | ICD-10-CM | POA: Diagnosis not present

## 2020-09-15 DIAGNOSIS — E1169 Type 2 diabetes mellitus with other specified complication: Secondary | ICD-10-CM | POA: Diagnosis not present

## 2020-09-15 DIAGNOSIS — E7800 Pure hypercholesterolemia, unspecified: Secondary | ICD-10-CM | POA: Diagnosis not present

## 2020-09-15 DIAGNOSIS — Z0000 Encounter for general adult medical examination without abnormal findings: Secondary | ICD-10-CM | POA: Diagnosis not present

## 2020-09-15 DIAGNOSIS — E559 Vitamin D deficiency, unspecified: Secondary | ICD-10-CM | POA: Diagnosis not present

## 2020-09-15 DIAGNOSIS — G2581 Restless legs syndrome: Secondary | ICD-10-CM | POA: Diagnosis not present

## 2020-10-02 DIAGNOSIS — H5213 Myopia, bilateral: Secondary | ICD-10-CM | POA: Diagnosis not present

## 2020-10-02 DIAGNOSIS — H524 Presbyopia: Secondary | ICD-10-CM | POA: Diagnosis not present

## 2020-10-02 DIAGNOSIS — H2512 Age-related nuclear cataract, left eye: Secondary | ICD-10-CM | POA: Diagnosis not present

## 2020-10-02 DIAGNOSIS — E119 Type 2 diabetes mellitus without complications: Secondary | ICD-10-CM | POA: Diagnosis not present

## 2020-10-02 DIAGNOSIS — H2511 Age-related nuclear cataract, right eye: Secondary | ICD-10-CM | POA: Diagnosis not present

## 2020-10-02 DIAGNOSIS — H353131 Nonexudative age-related macular degeneration, bilateral, early dry stage: Secondary | ICD-10-CM | POA: Diagnosis not present

## 2020-10-02 DIAGNOSIS — H52223 Regular astigmatism, bilateral: Secondary | ICD-10-CM | POA: Diagnosis not present

## 2020-10-08 ENCOUNTER — Ambulatory Visit: Payer: PPO | Admitting: Orthopaedic Surgery

## 2020-10-08 DIAGNOSIS — E7800 Pure hypercholesterolemia, unspecified: Secondary | ICD-10-CM | POA: Diagnosis not present

## 2020-10-08 DIAGNOSIS — E1165 Type 2 diabetes mellitus with hyperglycemia: Secondary | ICD-10-CM | POA: Diagnosis not present

## 2020-10-08 DIAGNOSIS — K219 Gastro-esophageal reflux disease without esophagitis: Secondary | ICD-10-CM | POA: Diagnosis not present

## 2020-10-08 DIAGNOSIS — I10 Essential (primary) hypertension: Secondary | ICD-10-CM | POA: Diagnosis not present

## 2020-10-08 DIAGNOSIS — E1169 Type 2 diabetes mellitus with other specified complication: Secondary | ICD-10-CM | POA: Diagnosis not present

## 2020-10-08 DIAGNOSIS — C50919 Malignant neoplasm of unspecified site of unspecified female breast: Secondary | ICD-10-CM | POA: Diagnosis not present

## 2020-10-10 ENCOUNTER — Inpatient Hospital Stay: Admission: RE | Admit: 2020-10-10 | Payer: PPO | Source: Ambulatory Visit

## 2020-10-28 DIAGNOSIS — G894 Chronic pain syndrome: Secondary | ICD-10-CM | POA: Diagnosis not present

## 2020-10-28 DIAGNOSIS — M47816 Spondylosis without myelopathy or radiculopathy, lumbar region: Secondary | ICD-10-CM | POA: Diagnosis not present

## 2020-10-28 DIAGNOSIS — M150 Primary generalized (osteo)arthritis: Secondary | ICD-10-CM | POA: Diagnosis not present

## 2020-10-28 DIAGNOSIS — Z79899 Other long term (current) drug therapy: Secondary | ICD-10-CM | POA: Diagnosis not present

## 2020-11-06 DIAGNOSIS — E7800 Pure hypercholesterolemia, unspecified: Secondary | ICD-10-CM | POA: Diagnosis not present

## 2020-11-06 DIAGNOSIS — I10 Essential (primary) hypertension: Secondary | ICD-10-CM | POA: Diagnosis not present

## 2020-11-06 DIAGNOSIS — E1165 Type 2 diabetes mellitus with hyperglycemia: Secondary | ICD-10-CM | POA: Diagnosis not present

## 2020-11-06 DIAGNOSIS — C50919 Malignant neoplasm of unspecified site of unspecified female breast: Secondary | ICD-10-CM | POA: Diagnosis not present

## 2020-11-06 DIAGNOSIS — K219 Gastro-esophageal reflux disease without esophagitis: Secondary | ICD-10-CM | POA: Diagnosis not present

## 2020-11-06 DIAGNOSIS — E1169 Type 2 diabetes mellitus with other specified complication: Secondary | ICD-10-CM | POA: Diagnosis not present

## 2020-11-12 ENCOUNTER — Telehealth: Payer: Self-pay

## 2020-11-12 ENCOUNTER — Ambulatory Visit (INDEPENDENT_AMBULATORY_CARE_PROVIDER_SITE_OTHER): Payer: PPO | Admitting: Orthopaedic Surgery

## 2020-11-12 ENCOUNTER — Encounter: Payer: Self-pay | Admitting: Orthopaedic Surgery

## 2020-11-12 ENCOUNTER — Ambulatory Visit (INDEPENDENT_AMBULATORY_CARE_PROVIDER_SITE_OTHER): Payer: PPO

## 2020-11-12 DIAGNOSIS — M25561 Pain in right knee: Secondary | ICD-10-CM | POA: Diagnosis not present

## 2020-11-12 DIAGNOSIS — M1711 Unilateral primary osteoarthritis, right knee: Secondary | ICD-10-CM

## 2020-11-12 DIAGNOSIS — G8929 Other chronic pain: Secondary | ICD-10-CM | POA: Diagnosis not present

## 2020-11-12 MED ORDER — LIDOCAINE HCL 1 % IJ SOLN
3.0000 mL | INTRAMUSCULAR | Status: AC | PRN
  Administered 2020-11-12: 3 mL

## 2020-11-12 MED ORDER — METHYLPREDNISOLONE ACETATE 40 MG/ML IJ SUSP
40.0000 mg | INTRAMUSCULAR | Status: AC | PRN
  Administered 2020-11-12: 40 mg via INTRA_ARTICULAR

## 2020-11-12 NOTE — Telephone Encounter (Signed)
Right gel injection  

## 2020-11-12 NOTE — Progress Notes (Signed)
Office Visit Note   Patient: Lauren [name]           Date of Birth: October 27, 1946           MRN: [id number] Visit Date: 11/12/2020              Requested by: Lawerance Cruel, [place],  [place] [id number] PCP: Lawerance Cruel, MD   Assessment & Plan: Visit Diagnoses:  1. Chronic pain of right knee   2. Unilateral primary osteoarthritis, right knee     Plan: She would like to stay conservative with her right knee thus far if possible.  I did offer steroid injection since its been about 7 months since her last her injection in her right knee.  She agreed to this and tolerated well.  She is a candidate for considering hyaluronic acid for the right knee.  She does have a history of a left total knee arthroplasty and that is done well for her.  She would like to avoid surgery if conservative treatment such as injections works and I agree with that as well.  Hopefully we will see her back in 4 weeks to place hyaluronic acid into the right knee. Follow-Up Instructions: Return in about 4 weeks (around 12/10/2020).   Orders:  Orders Placed This Encounter  Procedures  . Large Joint Inj  . XR Knee 1-2 Views Right   No orders of the defined types were placed in this encounter.     Procedures: Large Joint Inj: R knee on 11/12/2020 9:09 AM Indications: diagnostic evaluation and pain Details: 22 G 1.5 in needle, superolateral approach  Arthrogram: No  Medications: 3 mL lidocaine 1 %; 40 mg methylPREDNISolone acetate 40 MG/ML Outcome: tolerated well, no immediate complications Procedure, treatment alternatives, risks and benefits explained, specific risks discussed. Consent was given by the patient. Immediately prior to procedure a time out was called to verify the correct patient, procedure, equipment, support staff and site/side marked as required. Patient was prepped and draped in the usual sterile fashion.       Clinical Data: No additional  findings.   Subjective: Chief Complaint  Patient presents with  . Right Knee - Pain  The patient is a 75 year old female well-known to me.  She has been dealing with chronic right knee pain that has become more acute as of late.  We did place a steroid injection in her right knee in June of last year.  A MRI did show some areas of full-thickness cartilage loss at the patellofemoral joint and the medial compartment.  She has had no other acute change in her medical status other than worsening right knee pain.  Occasionally the knee will lock and catch on her as well.  HPI  Review of Systems She currently denies any headache, chest pain, shortness of breath, fever, chills, nausea, vomiting  Objective: Vital Signs: There were no vitals taken for this visit.  Physical Exam She is alert and oriented x3 and in no acute distress Ortho Exam Examination of her right knee does show some varus malalignment.  There is significant medial joint line tenderness and patellofemoral crepitation.  The knee is stable ligamentously.  It does hurt throughout his arc of motion from flexion to extension.  Her range of motion is good. Specialty Comments:  No specialty comments available.  Imaging: XR Knee 1-2 Views Right  Result Date: 11/12/2020 2 views of the right knee show no acute findings.  There is  moderate to severe arthritis with varus malalignment and significant medial joint space narrowing as well as patellofemoral narrowing.  There are osteophytes in 3 compartments.    PMFS History: Patient Active Problem List   Diagnosis Date Noted  . Coronary artery calcification 02/20/2020  . Pure hypercholesterolemia 02/20/2020  . Unilateral primary osteoarthritis, right knee 01/03/2019  . Genetic testing 12/07/2018  . Family history of breast cancer   . Family history of prostate cancer   . Family history of colon cancer   . Family history of kidney cancer   . Malignant neoplasm of upper-outer  quadrant of right breast in female, estrogen receptor positive ([place]) 11/09/2018  . Diabetic neuropathy, type II diabetes mellitus ([place]) 11/09/2018  . 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/02/2017  . Trochanteric bursitis, right hip 05/10/2017  . Primary osteoarthritis of left knee 03/09/2017  . Unilateral primary osteoarthritis, right hip 11/26/2016  . Status post total replacement of right hip 11/26/2016  . DDD (degenerative disc disease), 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  . Low back pain 04/23/2015  . Hiatal hernia 04/13/2013  . Type II or unspecified type diabetes mellitus without mention of complication, uncontrolled 01/26/2013  . Umbilical hernia 23/55/7322  . Essential hypertension    Past Medical History:  Diagnosis Date  . Arthritis    Back, Hip- right  . Cancer ([place])    right breast  . Cataract   . Coronary artery calcification 02/20/2020  . Diabetes mellitus without complication ([place])    Type II  . Endometriosis   . Family history of adverse reaction to anesthesia    Father - N/V - blood pressure; daughter N/V  . Family history of breast cancer   . Family history of colon cancer   . Family history of kidney cancer   . Family history of prostate cancer   . GERD (gastroesophageal reflux disease)   . History of kidney stones   . History of ulcerative colitis   . Hypertension   . Insomnia   . 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 Neuropathy   . Pneumonia    1983, 2003  . Pure hypercholesterolemia 02/20/2020  . PVCs (premature ventricular contractions)    benign  . Umbilical hernia   . Vitamin D deficiency   . Wears glasses     Family History  Problem Relation Age of Onset  . Hypertension Father   . Heart disease Father   . Diabetes Father   . Cancer Father        colon, prostate, and kidney  . Hypertension Mother   . Parkinson's disease Mother   . Heart disease Maternal Grandmother   . Diabetes Maternal Grandmother   . Heart disease Maternal Grandfather   .  Diabetes Maternal Grandfather   . Cancer Maternal Grandfather        pt unaware of what kind  . Stroke Paternal Grandmother   . Stroke Paternal Grandfather   . Colon cancer Paternal Aunt        dx less than 52  . Cancer Maternal Aunt        cervical vs ovarian  . Prostate cancer Paternal Uncle   . Prostate cancer Other        PGF's father  . Breast cancer Paternal Aunt        dx in her 55s  . Breast cancer Cousin        pat first cousin, dx over 88    Past Surgical History:  Procedure  Laterality Date  . APPENDECTOMY    . BREAST BIOPSY  08-13-2009  DR TSUEI   EXCISION LEFT NIPPLE DUCT  . BREAST EXCISIONAL BIOPSY Left    x2  . BREAST EXCISIONAL BIOPSY Right   . BREAST LUMPECTOMY Right 11/16/2018   invasive ductal  . CHOLECYSTECTOMY  1992  . COLON RESECTION  1986   ULCERATIVE COLITIS  . COLON SURGERY    . ESOPHAGEAL MANOMETRY N/A 06/11/2013   Procedure: ESOPHAGEAL MANOMETRY (EM);  Surgeon: Jeryl Columbia, MD;  Location: [facility];  Service: Endoscopy;  Laterality: N/A;  . ESOPHAGOGASTRODUODENOSCOPY (EGD) WITH PROPOFOL N/A 05/31/2016   Procedure: ESOPHAGOGASTRODUODENOSCOPY (EGD) WITH PROPOFOL;  Surgeon: Clarene Essex, MD;  Location: [facility] ENDOSCOPY;  Service: Endoscopy;  Laterality: N/A;  . EXCISION RIGHT BREAST MASS   10-20-2005  DR Marylene Buerger  . FRACTURE SURGERY     left arm  . kidney stone removed  2/11  . KNEE ARTHROSCOPY Left    MCL  . LEFT THUMB CARPOMETACARPAL JOINT SUSPENSIONPLASTY  04-06-2006  DR Burney Gauze  . LEFT WRIST ARTHROSCOPY W/ DEBRIDEMENT AND REMOVAL CYST  03-26-2004  DR Burney Gauze  . MYOMECTOMY    . RADIOACTIVE SEED GUIDED PARTIAL MASTECTOMY WITH AXILLARY SENTINEL LYMPH NODE BIOPSY Right 11/16/2018   Procedure: RIGHT BREAST RADIOACTIVE SEED GUIDED PARTIAL MASTECTOMY WITH  SENTINEL  NODE BIOPSY;  Surgeon: Coralie Keens, MD;  Location: [facility];  Service: General;  Laterality: Right;  . RIGHT COLECTOMY    . RIGHT URETEROSCOPIC STONE EXTRACTION  12-11-2009  DR Irine Seal  . TENDON RELEASE    . TONSILLECTOMY    . TOTAL HIP ARTHROPLASTY Right 11/26/2016   Procedure: RIGHT TOTAL HIP ARTHROPLASTY ANTERIOR APPROACH;  Surgeon: Mcarthur Rossetti, MD;  Location: [facility];  Service: Orthopedics;  Laterality: Right;  . TOTAL KNEE ARTHROPLASTY Left 12/02/2017   Procedure: LEFT TOTAL KNEE ARTHROPLASTY;  Surgeon: Mcarthur Rossetti, MD;  Location: [facility];  Service: Orthopedics;  Laterality: Left;  . WRIST SURGERY     both   Social History   Occupational History  . Not on file  Tobacco Use  . Smoking status: Former Smoker    Packs/day: 1.00    Years: 10.00    Pack years: 10.00    Types: Cigarettes    Quit date: 09/22/1982    Years since quitting: 38.1  . Smokeless tobacco: Never Used  . Tobacco comment: quit 1983  Vaping Use  . Vaping Use: Never used  Substance and Sexual Activity  . Alcohol use: No  . Drug use: No  . Sexual activity: Yes    Partners: Male    Birth control/protection: Post-menopausal

## 2020-11-13 NOTE — Telephone Encounter (Signed)
Noted  

## 2020-12-01 ENCOUNTER — Telehealth: Payer: Self-pay

## 2020-12-01 NOTE — Telephone Encounter (Signed)
Submitted VOB for Monovisc, right knee. 

## 2020-12-02 ENCOUNTER — Telehealth: Payer: Self-pay

## 2020-12-02 NOTE — Telephone Encounter (Signed)
Approved for Monovisc, right knee. [place] Patient will be responsible for 20% OOP. Co-pay of $20.00 No PA required  Appt. 12/10/2020 with Dr. [name]

## 2020-12-10 ENCOUNTER — Encounter: Payer: Self-pay | Admitting: Orthopaedic Surgery

## 2020-12-10 ENCOUNTER — Ambulatory Visit: Payer: PPO | Admitting: Orthopaedic Surgery

## 2020-12-10 DIAGNOSIS — M1711 Unilateral primary osteoarthritis, right knee: Secondary | ICD-10-CM

## 2020-12-10 MED ORDER — HYALURONAN 88 MG/4ML IX SOSY
88.0000 mg | PREFILLED_SYRINGE | INTRA_ARTICULAR | Status: AC | PRN
  Administered 2020-12-10: 88 mg via INTRA_ARTICULAR

## 2020-12-10 MED ORDER — LIDOCAINE HCL 1 % IJ SOLN
0.5000 mL | INTRAMUSCULAR | Status: AC | PRN
  Administered 2020-12-10: .5 mL

## 2020-12-10 NOTE — Progress Notes (Signed)
   Procedure Note  Patient: Lauren [name]             Date of Birth: 06/28/1946           MRN: [id number]             Visit Date: 12/10/2020 HPI: [name] comes in today for a scheduled right knee Monovisc injection.  She continues to have pain in her knee.  The steroid injection did help some with her knee pain.  She has had no new injury.  Right knee: No abnormal warmth or erythema.  Positive effusion.  Tenderness along medial joint line.  Procedures: Visit Diagnoses:  1. Unilateral primary osteoarthritis, right knee     Large Joint Inj: R knee on 12/10/2020 10:46 AM Indications: pain Details: 22 G 1.5 in needle, anterolateral approach  Arthrogram: No  Medications: 88 mg Hyaluronan 88 MG/4ML; 0.5 mL lidocaine 1 % Aspirate: 10 mL yellow Outcome: tolerated well, no immediate complications Procedure, treatment alternatives, risks and benefits explained, specific risks discussed. Consent was given by the patient. Immediately prior to procedure a time out was called to verify the correct patient, procedure, equipment, support staff and site/side marked as required. Patient was prepped and draped in the usual sterile fashion.     Plan: She understands to wait at least 3 months between cortisone injections and 6 months between Monovisc injections.  Continue to work on Forensic scientist.  Questions encouraged and answered.  Follow-up as needed.

## 2020-12-16 DIAGNOSIS — E1165 Type 2 diabetes mellitus with hyperglycemia: Secondary | ICD-10-CM | POA: Diagnosis not present

## 2020-12-16 DIAGNOSIS — C50919 Malignant neoplasm of unspecified site of unspecified female breast: Secondary | ICD-10-CM | POA: Diagnosis not present

## 2020-12-16 DIAGNOSIS — I10 Essential (primary) hypertension: Secondary | ICD-10-CM | POA: Diagnosis not present

## 2020-12-16 DIAGNOSIS — E1169 Type 2 diabetes mellitus with other specified complication: Secondary | ICD-10-CM | POA: Diagnosis not present

## 2020-12-16 DIAGNOSIS — K219 Gastro-esophageal reflux disease without esophagitis: Secondary | ICD-10-CM | POA: Diagnosis not present

## 2020-12-16 DIAGNOSIS — E7800 Pure hypercholesterolemia, unspecified: Secondary | ICD-10-CM | POA: Diagnosis not present

## 2020-12-25 DIAGNOSIS — M47816 Spondylosis without myelopathy or radiculopathy, lumbar region: Secondary | ICD-10-CM | POA: Diagnosis not present

## 2020-12-25 DIAGNOSIS — Z79899 Other long term (current) drug therapy: Secondary | ICD-10-CM | POA: Diagnosis not present

## 2020-12-25 DIAGNOSIS — H353131 Nonexudative age-related macular degeneration, bilateral, early dry stage: Secondary | ICD-10-CM | POA: Diagnosis not present

## 2020-12-25 DIAGNOSIS — G894 Chronic pain syndrome: Secondary | ICD-10-CM | POA: Diagnosis not present

## 2020-12-25 DIAGNOSIS — M150 Primary generalized (osteo)arthritis: Secondary | ICD-10-CM | POA: Diagnosis not present

## 2021-01-01 ENCOUNTER — Ambulatory Visit
Admission: RE | Admit: 2021-01-01 | Discharge: 2021-01-01 | Disposition: A | Discharge status: Home / Self Care | Payer: PPO | Source: Ambulatory Visit | Attending: Oncology | Admitting: Oncology

## 2021-01-01 ENCOUNTER — Other Ambulatory Visit: Payer: Self-pay

## 2021-01-01 DIAGNOSIS — M7741 Metatarsalgia, right foot: Secondary | ICD-10-CM | POA: Diagnosis not present

## 2021-01-01 DIAGNOSIS — Z9889 Other specified postprocedural states: Secondary | ICD-10-CM

## 2021-01-01 DIAGNOSIS — Z853 Personal history of malignant neoplasm of breast: Secondary | ICD-10-CM | POA: Diagnosis not present

## 2021-01-01 HISTORY — DX: Personal history of irradiation: Z92.3

## 2021-01-08 DIAGNOSIS — I10 Essential (primary) hypertension: Secondary | ICD-10-CM | POA: Diagnosis not present

## 2021-01-08 DIAGNOSIS — C50919 Malignant neoplasm of unspecified site of unspecified female breast: Secondary | ICD-10-CM | POA: Diagnosis not present

## 2021-01-08 DIAGNOSIS — K219 Gastro-esophageal reflux disease without esophagitis: Secondary | ICD-10-CM | POA: Diagnosis not present

## 2021-01-08 DIAGNOSIS — E1165 Type 2 diabetes mellitus with hyperglycemia: Secondary | ICD-10-CM | POA: Diagnosis not present

## 2021-01-08 DIAGNOSIS — E1169 Type 2 diabetes mellitus with other specified complication: Secondary | ICD-10-CM | POA: Diagnosis not present

## 2021-01-08 DIAGNOSIS — E7800 Pure hypercholesterolemia, unspecified: Secondary | ICD-10-CM | POA: Diagnosis not present

## 2021-01-15 ENCOUNTER — Ambulatory Visit: Payer: PPO | Admitting: Physician Assistant

## 2021-01-21 ENCOUNTER — Ambulatory Visit (INDEPENDENT_AMBULATORY_CARE_PROVIDER_SITE_OTHER): Payer: PPO

## 2021-01-21 ENCOUNTER — Encounter: Payer: Self-pay | Admitting: Physician Assistant

## 2021-01-21 ENCOUNTER — Ambulatory Visit (INDEPENDENT_AMBULATORY_CARE_PROVIDER_SITE_OTHER): Payer: PPO | Admitting: Physician Assistant

## 2021-01-21 DIAGNOSIS — M25871 Other specified joint disorders, right ankle and foot: Secondary | ICD-10-CM

## 2021-01-21 DIAGNOSIS — M79671 Pain in right foot: Secondary | ICD-10-CM | POA: Diagnosis not present

## 2021-01-21 NOTE — Progress Notes (Signed)
Office Visit Note   Patient: Lauren [name]           Date of Birth: 15-Dec-1945           MRN: [id number] Visit Date: 01/21/2021              Requested by: Lawerance Cruel, [place],  [place] [id number] PCP: Lawerance Cruel, MD   Assessment & Plan: Visit Diagnoses:  1. Pain in right foot   2. Sesamoiditis of right foot     Plan: Pad is placed in her right shoe with a cut out to allow pressure to be relief over the lateral sesamoid.  Discussed shoe wear with her.  She needs to be measured for shoe sizes her shoes appear to be too small.  She needs a shoe with a good arch support.  She is to change her shoes out daily.  She will apply Voltaren gel 2 g 4 times daily to the sesamoid area.  Like to see her back in 4 weeks if no improvement.  Questions were encouraged and answered at length.  Follow-Up Instructions: Return 4 weeks if no improvement.   Orders:  Orders Placed This Encounter  Procedures  . XR Foot Complete Right   No orders of the defined types were placed in this encounter.     Procedures: No procedures performed   Clinical Data: No additional findings.   Subjective: Chief Complaint  Patient presents with  . Right Foot - Pain    HPI Lauren [name] is well-known to our department service comes in today with new complaint of right foot pain for about 3 weeks.  She states that pain began all of a sudden.  Sharp pain plantar aspect of her foot near the great toe.  She has no history of gout.  She was seen at [facility] urgent care where radiographs were obtained and she was told she had no signs of stress fracture.  She is given a dose pack and was to follow-up here if no improvement.  She had no particular injury.  She notes she still has pain in the plantar aspect of the foot near the great toe. Review of Systems No fevers or chills.  Please see HPI otherwise negative  Objective: Vital Signs: There were no vitals taken for this  visit.  Physical Exam Constitutional:      Appearance: She is not ill-appearing or diaphoretic.  Neurological:     Mental Status: She is alert and oriented to person, place, and time.  Psychiatric:        Mood and Affect: Mood normal.     Ortho Exam Right foot she has 5 out of 5 strength with inversion eversion against resistance.  No tenderness over the posterior tibial tendon peroneal tendon.  Full range of motion ankle without pain.  There is no rashes, skin lesions, ulcerations, impending ulcers, ecchymosis or erythema about the right foot.  She has good range of motion through the right MP joint.  Tenderness over the right lateral sesamoid.  Slight tenderness in the plantar arch portion of the foot.  Remainder the foot is nontender.  Shoes appear to be undersized. Specialty Comments:  No specialty comments available.  Imaging: XR Foot Complete Right  Result Date: 01/21/2021 Right foot 3 views: No acute fractures.  No acute findings.  Sesamoids well located.    PMFS History: Patient Active Problem List   Diagnosis Date Noted  . Coronary artery  calcification 02/20/2020  . Pure hypercholesterolemia 02/20/2020  . Unilateral primary osteoarthritis, right knee 01/03/2019  . Genetic testing 12/07/2018  . Family history of breast cancer   . Family history of prostate cancer   . Family history of colon cancer   . Family history of kidney cancer   . Malignant neoplasm of upper-outer quadrant of right breast in female, estrogen receptor positive ([place]) 11/09/2018  . Diabetic neuropathy, type II diabetes mellitus ([place]) 11/09/2018  . 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/02/2017  . Trochanteric bursitis, right hip 05/10/2017  . Primary osteoarthritis of left knee 03/09/2017  . Unilateral primary osteoarthritis, right hip 11/26/2016  . Status post total replacement of right hip 11/26/2016  . DDD (degenerative disc disease), 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  . Low back pain 04/23/2015  .  Hiatal hernia 04/13/2013  . Type II or unspecified type diabetes mellitus without mention of complication, uncontrolled 01/26/2013  . Umbilical hernia 85/46/2703  . Essential hypertension    Past Medical History:  Diagnosis Date  . Arthritis    Back, Hip- right  . Cancer ([place])    right breast  . Cataract   . Coronary artery calcification 02/20/2020  . Diabetes mellitus without complication ([place])    Type II  . Endometriosis   . Family history of adverse reaction to anesthesia    Father - N/V - blood pressure; daughter N/V  . Family history of breast cancer   . Family history of colon cancer   . Family history of kidney cancer   . Family history of prostate cancer   . GERD (gastroesophageal reflux disease)   . History of kidney stones   . History of ulcerative colitis   . Hypertension   . Insomnia   . 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 Neuropathy   . Personal history of radiation therapy   . Pneumonia    1983, 2003  . Pure hypercholesterolemia 02/20/2020  . PVCs (premature ventricular contractions)    benign  . Umbilical hernia   . Vitamin D deficiency   . Wears glasses     Family History  Problem Relation Age of Onset  . Hypertension Father   . Heart disease Father   . Diabetes Father   . Cancer Father        colon, prostate, and kidney  . Hypertension Mother   . Parkinson's disease Mother   . Heart disease Maternal Grandmother   . Diabetes Maternal Grandmother   . Heart disease Maternal Grandfather   . Diabetes Maternal Grandfather   . Cancer Maternal Grandfather        pt unaware of what kind  . Stroke Paternal Grandmother   . Stroke Paternal Grandfather   . Colon cancer Paternal Aunt        dx less than 42  . Cancer Maternal Aunt        cervical vs ovarian  . Prostate cancer Paternal Uncle   . Prostate cancer Other        PGF's father  . Breast cancer Paternal Aunt        dx in her 76s  . Breast cancer Cousin        pat first cousin, dx over 60     Past Surgical History:  Procedure Laterality Date  . APPENDECTOMY    . BREAST BIOPSY  08-13-2009  DR TSUEI   EXCISION LEFT NIPPLE DUCT  . BREAST EXCISIONAL BIOPSY Left    x2  . BREAST  EXCISIONAL BIOPSY Right   . BREAST LUMPECTOMY Right 11/16/2018   invasive ductal  . CHOLECYSTECTOMY  1992  . COLON RESECTION  1986   ULCERATIVE COLITIS  . COLON SURGERY    . ESOPHAGEAL MANOMETRY N/A 06/11/2013   Procedure: ESOPHAGEAL MANOMETRY (EM);  Surgeon: Jeryl Columbia, MD;  [facility];  Service: Endoscopy;  Laterality: N/A;  . ESOPHAGOGASTRODUODENOSCOPY (EGD) WITH PROPOFOL N/A 05/31/2016   Procedure: ESOPHAGOGASTRODUODENOSCOPY (EGD) WITH PROPOFOL;  Surgeon: Clarene Essex, MD;  Location: [facility] END[facility]COPY;  Service: Endoscopy;  Laterality: N/A;  . EXCISION RIGHT BREAST MASS   10-20-2005  DR Marylene Buerger  . FRACTURE SURGERY     left arm  . kidney stone removed  2/11  . KNEE ARTHROSCOPY Left    MCL  . LEFT THUMB CARPOMETACARPAL JOINT SUSPENSIONPLASTY  04-06-2006  DR Burney Gauze  . LEFT WRIST ARTHROSCOPY W/ DEBRIDEMENT AND REMOVAL CYST  03-26-2004  DR Burney Gauze  . MYOMECTOMY    . RADIOACTIVE SEED GUIDED PARTIAL MASTECTOMY WITH AXILLARY SENTINEL LYMPH NODE BIOPSY Right 11/16/2018   Procedure: RIGHT BREAST RADIOACTIVE SEED GUIDED PARTIAL MASTECTOMY WITH  SENTINEL  NODE BIOPSY;  Surgeon: Coralie Keens, MD;  Location: [facility];  Service: General;  Laterality: Right;  . RIGHT COLECTOMY    . RIGHT URETEROSCOPIC STONE EXTRACTION  12-11-2009  DR Irine Seal  . TENDON RELEASE    . TONSILLECTOMY    . TOTAL HIP ARTHROPLASTY Right 11/26/2016   Procedure: RIGHT TOTAL HIP ARTHROPLASTY ANTERIOR APPROACH;  Surgeon: Mcarthur Rossetti, MD;  [facility]: [facility];  Service: Orthopedics;  Laterality: Right;  . TOTAL KNEE ARTHROPLASTY Left 12/02/2017   Procedure: LEFT TOTAL KNEE ARTHROPLASTY;  Surgeon: Mcarthur Rossetti, MD;  Location: [facility];  Service: Orthopedics;  Laterality: Left;  . WRIST SURGERY     both    Social History   Occupational History  . Not on file  Tobacco Use  . Smoking status: Former Smoker    Packs/day: 1.00    Years: 10.00    Pack years: 10.00    Types: Cigarettes    Quit date: 09/22/1982    Years since quitting: 38.3  . Smokeless tobacco: Never Used  . Tobacco comment: quit 1983  Vaping Use  . Vaping Use: Never used  Substance and Sexual Activity  . Alcohol use: No  . Drug use: No  . Sexual activity: Yes    Partners: Male    Birth control/protection: Post-menopausal

## 2021-01-26 ENCOUNTER — Ambulatory Visit: Payer: PPO | Admitting: Physician Assistant

## 2021-02-04 DIAGNOSIS — K219 Gastro-esophageal reflux disease without esophagitis: Secondary | ICD-10-CM | POA: Diagnosis not present

## 2021-02-04 DIAGNOSIS — E1169 Type 2 diabetes mellitus with other specified complication: Secondary | ICD-10-CM | POA: Diagnosis not present

## 2021-02-04 DIAGNOSIS — I10 Essential (primary) hypertension: Secondary | ICD-10-CM | POA: Diagnosis not present

## 2021-02-04 DIAGNOSIS — E7800 Pure hypercholesterolemia, unspecified: Secondary | ICD-10-CM | POA: Diagnosis not present

## 2021-02-04 DIAGNOSIS — E1165 Type 2 diabetes mellitus with hyperglycemia: Secondary | ICD-10-CM | POA: Diagnosis not present

## 2021-02-06 DIAGNOSIS — J069 Acute upper respiratory infection, unspecified: Secondary | ICD-10-CM | POA: Diagnosis not present

## 2021-02-08 DIAGNOSIS — J069 Acute upper respiratory infection, unspecified: Secondary | ICD-10-CM | POA: Diagnosis not present

## 2021-02-08 DIAGNOSIS — R509 Fever, unspecified: Secondary | ICD-10-CM | POA: Diagnosis not present

## 2021-02-08 DIAGNOSIS — R051 Acute cough: Secondary | ICD-10-CM | POA: Diagnosis not present

## 2021-02-08 DIAGNOSIS — Z20822 Contact with and (suspected) exposure to covid-19: Secondary | ICD-10-CM | POA: Diagnosis not present

## 2021-02-12 DIAGNOSIS — M7631 Iliotibial band syndrome, right leg: Secondary | ICD-10-CM | POA: Diagnosis not present

## 2021-02-12 DIAGNOSIS — G894 Chronic pain syndrome: Secondary | ICD-10-CM | POA: Diagnosis not present

## 2021-02-12 DIAGNOSIS — Z79899 Other long term (current) drug therapy: Secondary | ICD-10-CM | POA: Diagnosis not present

## 2021-02-12 DIAGNOSIS — M150 Primary generalized (osteo)arthritis: Secondary | ICD-10-CM | POA: Diagnosis not present

## 2021-02-17 DIAGNOSIS — R2681 Unsteadiness on feet: Secondary | ICD-10-CM | POA: Diagnosis not present

## 2021-02-17 DIAGNOSIS — M7631 Iliotibial band syndrome, right leg: Secondary | ICD-10-CM | POA: Diagnosis not present

## 2021-02-17 DIAGNOSIS — M6281 Muscle weakness (generalized): Secondary | ICD-10-CM | POA: Diagnosis not present

## 2021-02-17 DIAGNOSIS — M25561 Pain in right knee: Secondary | ICD-10-CM | POA: Diagnosis not present

## 2021-02-18 DIAGNOSIS — M6281 Muscle weakness (generalized): Secondary | ICD-10-CM | POA: Diagnosis not present

## 2021-02-18 DIAGNOSIS — M25561 Pain in right knee: Secondary | ICD-10-CM | POA: Diagnosis not present

## 2021-02-18 DIAGNOSIS — M7631 Iliotibial band syndrome, right leg: Secondary | ICD-10-CM | POA: Diagnosis not present

## 2021-02-18 DIAGNOSIS — R2681 Unsteadiness on feet: Secondary | ICD-10-CM | POA: Diagnosis not present

## 2021-02-25 DIAGNOSIS — R2681 Unsteadiness on feet: Secondary | ICD-10-CM | POA: Diagnosis not present

## 2021-02-25 DIAGNOSIS — M6281 Muscle weakness (generalized): Secondary | ICD-10-CM | POA: Diagnosis not present

## 2021-02-25 DIAGNOSIS — M25561 Pain in right knee: Secondary | ICD-10-CM | POA: Diagnosis not present

## 2021-02-25 DIAGNOSIS — M7631 Iliotibial band syndrome, right leg: Secondary | ICD-10-CM | POA: Diagnosis not present

## 2021-02-27 DIAGNOSIS — R2681 Unsteadiness on feet: Secondary | ICD-10-CM | POA: Diagnosis not present

## 2021-02-27 DIAGNOSIS — M6281 Muscle weakness (generalized): Secondary | ICD-10-CM | POA: Diagnosis not present

## 2021-02-27 DIAGNOSIS — M7631 Iliotibial band syndrome, right leg: Secondary | ICD-10-CM | POA: Diagnosis not present

## 2021-02-27 DIAGNOSIS — M25561 Pain in right knee: Secondary | ICD-10-CM | POA: Diagnosis not present

## 2021-03-05 ENCOUNTER — Ambulatory Visit: Payer: PPO | Admitting: Cardiovascular Disease

## 2021-03-05 ENCOUNTER — Encounter: Payer: Self-pay | Admitting: Cardiovascular Disease

## 2021-03-05 ENCOUNTER — Other Ambulatory Visit: Payer: Self-pay

## 2021-03-05 VITALS — BP 134/62 | HR 66 | Ht 65.0 in | Wt 224.4 lb

## 2021-03-05 DIAGNOSIS — E7800 Pure hypercholesterolemia, unspecified: Secondary | ICD-10-CM

## 2021-03-05 DIAGNOSIS — I2584 Coronary atherosclerosis due to calcified coronary lesion: Secondary | ICD-10-CM

## 2021-03-05 DIAGNOSIS — Z6837 Body mass index (BMI) 37.0-37.9, adult: Secondary | ICD-10-CM | POA: Diagnosis not present

## 2021-03-05 DIAGNOSIS — M25561 Pain in right knee: Secondary | ICD-10-CM | POA: Diagnosis not present

## 2021-03-05 DIAGNOSIS — M6281 Muscle weakness (generalized): Secondary | ICD-10-CM | POA: Diagnosis not present

## 2021-03-05 DIAGNOSIS — R609 Edema, unspecified: Secondary | ICD-10-CM

## 2021-03-05 DIAGNOSIS — R600 Localized edema: Secondary | ICD-10-CM

## 2021-03-05 DIAGNOSIS — I10 Essential (primary) hypertension: Secondary | ICD-10-CM | POA: Diagnosis not present

## 2021-03-05 DIAGNOSIS — I2510 Atherosclerotic heart disease of native coronary artery without angina pectoris: Secondary | ICD-10-CM

## 2021-03-05 DIAGNOSIS — R2681 Unsteadiness on feet: Secondary | ICD-10-CM | POA: Diagnosis not present

## 2021-03-05 DIAGNOSIS — M7631 Iliotibial band syndrome, right leg: Secondary | ICD-10-CM | POA: Diagnosis not present

## 2021-03-05 HISTORY — DX: Localized edema: R60.0

## 2021-03-05 HISTORY — DX: Edema, unspecified: R60.9

## 2021-03-05 NOTE — Progress Notes (Signed)
Cardiology Office Note   Evaluation Performed:  Follow-up visit  Date:  03/05/2021   ID:  CAMYIAH HEIM, DOB [birth year], MRN [id number]  PCP:  Lawerance Cruel, MD  Cardiologist:  Skeet Latch, MD  Electrophysiologist:  None   Chief Complaint:  Follow up  History of Present Illness:    Lauren [name] is a 75 y.o. female with asymptomatic coronary calcification, diabetes, hypertension, hyperlipidemia, R breast cancer s/p surgery/XRT/chemo and family history of CAD who presents for follow up.    She was initially seen 09/2015 for an evaluation of chest pain.  She had been seen in the emergency department  where cardiac enzymes were negative.  D-dimer was elevated, so she underwent CT-A of the chest that was negative for PE. She had a [employer] 11/2016 that revealed LVEF 72% and no ischemia.  Amlodipine was added to her regimen and her blood pressure has been much better controlled since that time.  Ms. Bobinski reported chest discomfort.  She was referred for [facility] 09/2018 that revealed LVEF 77% and no ischemia. She was diagnosed with breast cancer.  She had a routine mammogram in December and was found to have localized disease.  She underwent lumpectomy followed by radiation.    Since her last appointment Ms. Huffstutler has been feeling well.  She has been doing some yard work and has no exertional symptoms.  She has been struggling with her right IT band and stiffness.  She is doing some stretching exercises to help with this.  Lately she has noted some swelling in her feet that seems to be worse at the end of the day.  She denies any shortness of breath, orthopnea, or PND.  She does note that she has had some increased salt in her diet lately.  She both cooks at home and eats out.  She does not add salt to her food but does not actively avoid it either.  She has been struggling to lose weight.  She also notes intermittent episodes of nocturia.    Past Medical  History:  Diagnosis Date  . Arthritis    Back, Hip- right  . Cancer ([place])    right breast  . Cataract   . Coronary artery calcification 02/20/2020  . Diabetes mellitus without complication ([place])    Type II  . Edema 03/05/2021  . Endometriosis   . Family history of adverse reaction to anesthesia    Father - N/V - blood pressure; daughter N/V  . Family history of breast cancer   . Family history of colon cancer   . Family history of kidney cancer   . Family history of prostate cancer   . GERD (gastroesophageal reflux disease)   . History of kidney stones   . History of ulcerative colitis   . Hypertension   . Insomnia   . Lower extremity edema 03/05/2021  . 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 Neuropathy   . Personal history of radiation therapy   . Pneumonia    1983, 2003  . Pure hypercholesterolemia 02/20/2020  . PVCs (premature ventricular contractions)    benign  . Umbilical hernia   . Vitamin D deficiency   . Wears glasses    Past Surgical History:  Procedure Laterality Date  . APPENDECTOMY    . BREAST BIOPSY  08-13-2009  DR TSUEI   EXCISION LEFT NIPPLE DUCT  . BREAST EXCISIONAL BIOPSY Left    x2  . BREAST EXCISIONAL BIOPSY  Right   . BREAST LUMPECTOMY Right 11/16/2018   invasive ductal  . CHOLECYSTECTOMY  1992  . COLON RESECTION  1986   ULCERATIVE COLITIS  . COLON SURGERY    . ESOPHAGEAL MANOMETRY N/A 06/11/2013   Procedure: ESOPHAGEAL MANOMETRY (EM);  Surgeon: Jeryl Columbia, MD;  Location: [facility];  Service: Endoscopy;  Laterality: N/A;  . ESOPHAGOGASTRODUODENOSCOPY (EGD) WITH PROPOFOL N/A 05/31/2016   Procedure: ESOPHAGOGASTRODUODENOSCOPY (EGD) WITH PROPOFOL;  Surgeon: Clarene Essex, MD;  Location: [facility];  Service: Endoscopy;  Laterality: N/A;  . EXCISION RIGHT BREAST MASS   10-20-2005  DR Marylene Buerger  . FRACTURE SURGERY     left arm  . kidney stone removed  2/11  . KNEE ARTHROSCOPY Left    MCL  . LEFT THUMB CARPOMETACARPAL JOINT SUSPENSIONPLASTY   04-06-2006  DR Burney Gauze  . LEFT WRIST ARTHROSCOPY W/ DEBRIDEMENT AND REMOVAL CYST  03-26-2004  DR Burney Gauze  . MYOMECTOMY    . RADIOACTIVE SEED GUIDED PARTIAL MASTECTOMY WITH AXILLARY SENTINEL LYMPH NODE BIOPSY Right 11/16/2018   Procedure: RIGHT BREAST RADIOACTIVE SEED GUIDED PARTIAL MASTECTOMY WITH  SENTINEL  NODE BIOPSY;  Surgeon: Coralie Keens, MD;  Location: [place];  Service: General;  Laterality: Right;  . RIGHT COLECTOMY    . RIGHT URETEROSCOPIC STONE EXTRACTION  12-11-2009  DR Irine Seal  . TENDON RELEASE    . TONSILLECTOMY    . TOTAL HIP ARTHROPLASTY Right 11/26/2016   Procedure: RIGHT TOTAL HIP ARTHROPLASTY ANTERIOR APPROACH;  Surgeon: Mcarthur Rossetti, MD;  Location: [facility];  Service: Orthopedics;  Laterality: Right;  . TOTAL KNEE ARTHROPLASTY Left 12/02/2017   Procedure: LEFT TOTAL KNEE ARTHROPLASTY;  Surgeon: Mcarthur Rossetti, MD;  Location: [facility];  Service: Orthopedics;  Laterality: Left;  . WRIST SURGERY     both     Current Meds  Medication Sig  . amLODipine (NORVASC) 5 MG tablet TAKE 1 TABLET BY MOUTH ONCE DAILY *NEED  OFFICE  VISIT*  . aspirin EC 81 MG tablet Take 81 mg by mouth daily.  Marland Kitchen b complex vitamins tablet Take 1 tablet by mouth daily.  . balsalazide (COLAZAL) 750 MG capsule Take 2,250 mg by mouth 3 (three) times daily.   . Cholecalciferol (VITAMIN D3) 125 MCG (5000 UT) CAPS 1 capsule  . cholestyramine (QUESTRAN) 4 GM/DOSE powder Take 4 g by mouth daily.   . DULoxetine (CYMBALTA) 20 MG capsule Take 20 mg by mouth daily.  . furosemide (LASIX) 20 MG tablet Take 20 mg by mouth daily.   Marland Kitchen gabapentin (NEURONTIN) 300 MG capsule Take 600 mg by mouth at bedtime.   Marland Kitchen HYDROcodone-acetaminophen (NORCO) 10-325 MG tablet Take by mouth.  . Lancets (ONETOUCH DELICA PLUS [id number]) MISC   . lidocaine (LMX) 4 % cream Apply 1 application topically daily as needed (for joint pain).   . metFORMIN (GLUCOPHAGE-XR) 500 MG 24 hr tablet Take 1,000 mg by mouth 2 (two) times  daily.   . methocarbamol (ROBAXIN) 500 MG tablet Take 1 tablet (500 mg total) by mouth every 6 (six) hours as needed for muscle spasms. (Patient taking differently: Take 500 mg by mouth at bedtime.)  . ONETOUCH VERIO test strip   . Oxymetazoline HCl (NASAL SPRAY NA) Place 1-2 sprays into both nostrils at bedtime as needed (for allergies).  . pantoprazole (PROTONIX) 40 MG tablet Take 40 mg by mouth 2 (two) times daily.  . pioglitazone (ACTOS) 15 MG tablet Take 45 mg by mouth daily.  . quinapril (ACCUPRIL) 40 MG tablet  Take 40 mg by mouth daily.  . rosuvastatin (CRESTOR) 20 MG tablet TAKE 1 TABLET BY MOUTH EVERY DAY  . [DISCONTINUED] Cholecalciferol (VITAMIN D3) 50 MCG (2000 UT) TABS Take 2 tablets by mouth every morning.  . [DISCONTINUED] exemestane (AROMASIN) 25 MG tablet Take 1 tablet (25 mg total) by mouth daily after breakfast.  . [DISCONTINUED] letrozole (FEMARA) 2.5 MG tablet Take 1 tablet (2.5 mg total) by mouth daily.     Allergies:   Ciprofloxacin hcl, Percocet [oxycodone-acetaminophen], Penicillins, Sulfa antibiotics, Tape, Tetanus toxoids, and Jardiance [empagliflozin]   Social History   Tobacco Use  . Smoking status: Former Smoker    Packs/day: 1.00    Years: 10.00    Pack years: 10.00    Types: Cigarettes    Quit date: 09/22/1982    Years since quitting: 38.4  . Smokeless tobacco: Never Used  . Tobacco comment: quit 1983  Vaping Use  . Vaping Use: Never used  Substance Use Topics  . Alcohol use: No  . Drug use: No     Family Hx: The patient's family history includes Breast cancer in her cousin and paternal aunt; Cancer in her father, maternal aunt, and maternal grandfather; Colon cancer in her paternal aunt; Diabetes in her father, maternal grandfather, and maternal grandmother; Heart disease in her father, maternal grandfather, and maternal grandmother; Hypertension in her father and mother; Parkinson's disease in her mother; Prostate cancer in her paternal uncle and  another family member; Stroke in her paternal grandfather and paternal grandmother.  ROS:   Please see the history of present illness.    All other systems reviewed and are negative.   Prior CV studies:   The following studies were reviewed today:  Lexiscan Myoview 09/18/18:  The left ventricular ejection fraction is hyperdynamic (>65%).  Nuclear stress EF: 77%.  There was no ST segment deviation noted during stress.  The study is normal.  This is a low risk study.   Normal pharmacologic nuclear stress test with no evidence for prior infarct or ischemia.   Lexiscan Myoview 11/19/16: Nuclear stress EF: 72%.  The left ventricular ejection fraction is hyperdynamic (>65%).  There was no ST segment deviation noted during stress.  This is a low risk study. No perfusion defects.   Labs/Other Tests and Data Reviewed:    EKG:  An ECG dated 02/18/2020 was personally reviewed today and demonstrated:  Sinus rhythm.  Rate 97 bpm.  Left axis deviation.  Low voltage.  03/05/2021: Sinus rhythm.  Rate 66 bpm.  Recent Labs: 08/05/2020: ALT 15; BUN 11; Creatinine, Ser 0.84; Hemoglobin 11.1; Platelets 258; Potassium 4.1; Sodium 140   Recent Lipid Panel No results found for: CHOL, TRIG, HDL, CHOLHDL, LDLCALC, LDLDIRECT   09/17/2019: Total cholesterol 119, triglycerides 132, HDL 49, LDL 43  Wt Readings from Last 3 Encounters:  03/05/21 224 lb 6.4 oz (101.8 kg)  08/05/20 231 lb 1.6 oz (104.8 kg)  05/20/20 220 lb (99.8 kg)     Objective:   VS:  BP 134/62   Pulse 66   Ht 5\' 5"  (1.651 m)   Wt 224 lb 6.4 oz (101.8 kg)   SpO2 96%   BMI 37.34 kg/m  , BMI Body mass index is 37.34 kg/m. GENERAL:  Well appearing HEENT: Pupils equal round and reactive, fundi not visualized, oral mucosa unremarkable NECK: JVP 2 cm above the clavicle at 45 degrees.  Waveform within normal limits, carotid upstroke brisk and symmetric, no bruits LUNGS:  Clear to auscultation bilaterally HEART:  RRR.  PMI  not displaced or sustained,S1 and S2 within normal limits, no S3, no S4, no clicks, no rubs, no murmurs ABD:  Flat, positive bowel sounds normal in frequency in pitch, no bruits, no rebound, no guarding, no midline pulsatile mass, no hepatomegaly, no splenomegaly EXT:  2 plus pulses throughout, 2+ pitting edema to the mid tibia bilaterally, no cyanosis no clubbing SKIN:  No rashes no nodules NEURO:  Cranial nerves II through XII grossly intact, motor grossly intact throughout PSYCH:  Cognitively intact, oriented to person place and time  ASSESSMENT & PLAN:    # LE edema: Ms. Zehner has increased lower extremity edema and her JVP is mildly elevated.  She has no shortness of breath.  We will get an echo to make sure there is no evidence of heart failure.  For now we will increase her Lasix to 40 mg a day for 3 days.  She will go back to 20 mg after that given her concern about nocturia.  # Chest pain:  # Coronary calcification:  Chest pain resolved.  Lexiscan Myoview was negative for ischemia 09/2018.  Continue aspirin and rosuvastatin.  LDL was 34 on 09/2020.  # Hypertension:  Blood pressure is very mildly elevated.  At home it has been well-controlled.  She is volume overloaded and we are increasing her Lasix.  Therefore we will not make any changes to her antihypertensive regimen.   # Hyperlipidemia: Continue rosuvastatin. LDL was 43 on 09/2019.    Medication Adjustments/Labs and Tests Ordered: Current medicines are reviewed at length with the patient today.  Concerns regarding medicines are outlined above.   Tests Ordered: Orders Placed This Encounter  Procedures  . Ambulatory referral to [facility]  . EKG 12-Lead  . ECHOCARDIOGRAM COMPLETE    Medication Changes: No orders of the defined types were placed in this encounter.   Disposition:  Follow up in 2-3 months  Signed, Skeet Latch, MD  03/05/2021 11:38 AM    [facility]

## 2021-03-05 NOTE — Patient Instructions (Addendum)
Medication Instructions:  INCREASE YOUR FUROSEMIDE TO 40 MG DAILY FOR 3 DAYS ONLY, THEN BACK TO 20 MG DAILY   *If you need a refill on your cardiac medications before your next appointment, please call your pharmacy*  Lab Work: NONE   Testing/Procedures: Your physician has requested that you have an echocardiogram. Echocardiography is a painless test that uses sound waves to create images of your heart. It provides your doctor with information about the size and shape of your heart and how well your heart's chambers and valves are working. This procedure takes approximately one hour. There are no restrictions for this procedure. [place] [address]   Follow-Up: At [facility], you and your health needs are our priority.  As part of our continuing mission to provide you with exceptional heart care, we have created designated Provider Care Teams.  These Care Teams include your primary Cardiologist (physician) and Advanced Practice Providers (APPs -  Physician Assistants and Nurse Practitioners) who all work together to provide you with the care you need, when you need it.  We recommend signing up for the patient portal called "MyChart".  Sign up information is provided on this After Visit Summary.  MyChart is used to connect with patients for Virtual Visits (Telemedicine).  Patients are able to view lab/test results, encounter notes, upcoming appointments, etc.  Non-urgent messages can be sent to your provider as well.   To learn more about what you can do with MyChart, go to [email or web address].    Your next appointment:   2-3 month(s)  The format for your next appointment:   In Person  Provider:   DR Mart have been referred to     Where: [facility] Address: [place] [address] Phone: [phone]  IF YOU DO NOT HEAR FROM THE OFFICE IN 2 WEEKS CALL THEM DIRECTLY AT [facility]

## 2021-03-09 DIAGNOSIS — E7800 Pure hypercholesterolemia, unspecified: Secondary | ICD-10-CM | POA: Diagnosis not present

## 2021-03-09 DIAGNOSIS — K219 Gastro-esophageal reflux disease without esophagitis: Secondary | ICD-10-CM | POA: Diagnosis not present

## 2021-03-09 DIAGNOSIS — C50919 Malignant neoplasm of unspecified site of unspecified female breast: Secondary | ICD-10-CM | POA: Diagnosis not present

## 2021-03-09 DIAGNOSIS — I10 Essential (primary) hypertension: Secondary | ICD-10-CM | POA: Diagnosis not present

## 2021-03-09 DIAGNOSIS — E1169 Type 2 diabetes mellitus with other specified complication: Secondary | ICD-10-CM | POA: Diagnosis not present

## 2021-03-10 DIAGNOSIS — M7631 Iliotibial band syndrome, right leg: Secondary | ICD-10-CM | POA: Diagnosis not present

## 2021-03-10 DIAGNOSIS — M25561 Pain in right knee: Secondary | ICD-10-CM | POA: Diagnosis not present

## 2021-03-10 DIAGNOSIS — R2681 Unsteadiness on feet: Secondary | ICD-10-CM | POA: Diagnosis not present

## 2021-03-10 DIAGNOSIS — M6281 Muscle weakness (generalized): Secondary | ICD-10-CM | POA: Diagnosis not present

## 2021-03-13 DIAGNOSIS — M25561 Pain in right knee: Secondary | ICD-10-CM | POA: Diagnosis not present

## 2021-03-13 DIAGNOSIS — M7631 Iliotibial band syndrome, right leg: Secondary | ICD-10-CM | POA: Diagnosis not present

## 2021-03-13 DIAGNOSIS — R2681 Unsteadiness on feet: Secondary | ICD-10-CM | POA: Diagnosis not present

## 2021-03-13 DIAGNOSIS — M6281 Muscle weakness (generalized): Secondary | ICD-10-CM | POA: Diagnosis not present

## 2021-03-17 DIAGNOSIS — E1169 Type 2 diabetes mellitus with other specified complication: Secondary | ICD-10-CM | POA: Diagnosis not present

## 2021-03-17 DIAGNOSIS — R2681 Unsteadiness on feet: Secondary | ICD-10-CM | POA: Diagnosis not present

## 2021-03-17 DIAGNOSIS — M25561 Pain in right knee: Secondary | ICD-10-CM | POA: Diagnosis not present

## 2021-03-17 DIAGNOSIS — M7631 Iliotibial band syndrome, right leg: Secondary | ICD-10-CM | POA: Diagnosis not present

## 2021-03-17 DIAGNOSIS — M6281 Muscle weakness (generalized): Secondary | ICD-10-CM | POA: Diagnosis not present

## 2021-03-19 DIAGNOSIS — M25561 Pain in right knee: Secondary | ICD-10-CM | POA: Diagnosis not present

## 2021-03-19 DIAGNOSIS — M6281 Muscle weakness (generalized): Secondary | ICD-10-CM | POA: Diagnosis not present

## 2021-03-19 DIAGNOSIS — M7631 Iliotibial band syndrome, right leg: Secondary | ICD-10-CM | POA: Diagnosis not present

## 2021-03-19 DIAGNOSIS — R2681 Unsteadiness on feet: Secondary | ICD-10-CM | POA: Diagnosis not present

## 2021-03-26 DIAGNOSIS — G894 Chronic pain syndrome: Secondary | ICD-10-CM | POA: Diagnosis not present

## 2021-03-26 DIAGNOSIS — Z79899 Other long term (current) drug therapy: Secondary | ICD-10-CM | POA: Diagnosis not present

## 2021-03-26 DIAGNOSIS — M7631 Iliotibial band syndrome, right leg: Secondary | ICD-10-CM | POA: Diagnosis not present

## 2021-03-26 DIAGNOSIS — M150 Primary generalized (osteo)arthritis: Secondary | ICD-10-CM | POA: Diagnosis not present

## 2021-03-31 ENCOUNTER — Other Ambulatory Visit: Payer: Self-pay

## 2021-03-31 ENCOUNTER — Ambulatory Visit (HOSPITAL_COMMUNITY): Payer: PPO | Attending: Internal Medicine

## 2021-03-31 DIAGNOSIS — I10 Essential (primary) hypertension: Secondary | ICD-10-CM | POA: Diagnosis not present

## 2021-03-31 DIAGNOSIS — R600 Localized edema: Secondary | ICD-10-CM

## 2021-03-31 LAB — ECHOCARDIOGRAM COMPLETE
Area-P 1/2: 2.63 cm2
S' Lateral: 3.2 cm

## 2021-04-01 DIAGNOSIS — M25561 Pain in right knee: Secondary | ICD-10-CM | POA: Diagnosis not present

## 2021-04-01 DIAGNOSIS — Z7984 Long term (current) use of oral hypoglycemic drugs: Secondary | ICD-10-CM | POA: Diagnosis not present

## 2021-04-01 DIAGNOSIS — M6281 Muscle weakness (generalized): Secondary | ICD-10-CM | POA: Diagnosis not present

## 2021-04-01 DIAGNOSIS — R609 Edema, unspecified: Secondary | ICD-10-CM | POA: Diagnosis not present

## 2021-04-01 DIAGNOSIS — R2681 Unsteadiness on feet: Secondary | ICD-10-CM | POA: Diagnosis not present

## 2021-04-01 DIAGNOSIS — M7631 Iliotibial band syndrome, right leg: Secondary | ICD-10-CM | POA: Diagnosis not present

## 2021-04-01 DIAGNOSIS — R350 Frequency of micturition: Secondary | ICD-10-CM | POA: Diagnosis not present

## 2021-04-01 DIAGNOSIS — E1169 Type 2 diabetes mellitus with other specified complication: Secondary | ICD-10-CM | POA: Diagnosis not present

## 2021-04-06 DIAGNOSIS — M25561 Pain in right knee: Secondary | ICD-10-CM | POA: Diagnosis not present

## 2021-04-06 DIAGNOSIS — M6281 Muscle weakness (generalized): Secondary | ICD-10-CM | POA: Diagnosis not present

## 2021-04-06 DIAGNOSIS — R2681 Unsteadiness on feet: Secondary | ICD-10-CM | POA: Diagnosis not present

## 2021-04-06 DIAGNOSIS — M7631 Iliotibial band syndrome, right leg: Secondary | ICD-10-CM | POA: Diagnosis not present

## 2021-04-09 DIAGNOSIS — C50919 Malignant neoplasm of unspecified site of unspecified female breast: Secondary | ICD-10-CM | POA: Diagnosis not present

## 2021-04-09 DIAGNOSIS — E1169 Type 2 diabetes mellitus with other specified complication: Secondary | ICD-10-CM | POA: Diagnosis not present

## 2021-04-09 DIAGNOSIS — M6281 Muscle weakness (generalized): Secondary | ICD-10-CM | POA: Diagnosis not present

## 2021-04-09 DIAGNOSIS — M25561 Pain in right knee: Secondary | ICD-10-CM | POA: Diagnosis not present

## 2021-04-09 DIAGNOSIS — R2681 Unsteadiness on feet: Secondary | ICD-10-CM | POA: Diagnosis not present

## 2021-04-09 DIAGNOSIS — M7631 Iliotibial band syndrome, right leg: Secondary | ICD-10-CM | POA: Diagnosis not present

## 2021-04-09 DIAGNOSIS — K219 Gastro-esophageal reflux disease without esophagitis: Secondary | ICD-10-CM | POA: Diagnosis not present

## 2021-04-09 DIAGNOSIS — I10 Essential (primary) hypertension: Secondary | ICD-10-CM | POA: Diagnosis not present

## 2021-04-09 DIAGNOSIS — E7800 Pure hypercholesterolemia, unspecified: Secondary | ICD-10-CM | POA: Diagnosis not present

## 2021-04-14 DIAGNOSIS — M25561 Pain in right knee: Secondary | ICD-10-CM | POA: Diagnosis not present

## 2021-04-14 DIAGNOSIS — M7631 Iliotibial band syndrome, right leg: Secondary | ICD-10-CM | POA: Diagnosis not present

## 2021-04-14 DIAGNOSIS — M6281 Muscle weakness (generalized): Secondary | ICD-10-CM | POA: Diagnosis not present

## 2021-04-14 DIAGNOSIS — R2681 Unsteadiness on feet: Secondary | ICD-10-CM | POA: Diagnosis not present

## 2021-04-16 DIAGNOSIS — M6281 Muscle weakness (generalized): Secondary | ICD-10-CM | POA: Diagnosis not present

## 2021-04-16 DIAGNOSIS — R2681 Unsteadiness on feet: Secondary | ICD-10-CM | POA: Diagnosis not present

## 2021-04-16 DIAGNOSIS — M7631 Iliotibial band syndrome, right leg: Secondary | ICD-10-CM | POA: Diagnosis not present

## 2021-04-16 DIAGNOSIS — M25561 Pain in right knee: Secondary | ICD-10-CM | POA: Diagnosis not present

## 2021-04-20 DIAGNOSIS — M6281 Muscle weakness (generalized): Secondary | ICD-10-CM | POA: Diagnosis not present

## 2021-04-20 DIAGNOSIS — M7631 Iliotibial band syndrome, right leg: Secondary | ICD-10-CM | POA: Diagnosis not present

## 2021-04-20 DIAGNOSIS — M25561 Pain in right knee: Secondary | ICD-10-CM | POA: Diagnosis not present

## 2021-04-20 DIAGNOSIS — R2681 Unsteadiness on feet: Secondary | ICD-10-CM | POA: Diagnosis not present

## 2021-04-22 ENCOUNTER — Encounter (INDEPENDENT_AMBULATORY_CARE_PROVIDER_SITE_OTHER): Payer: Self-pay | Admitting: Family Medicine

## 2021-04-22 ENCOUNTER — Ambulatory Visit (INDEPENDENT_AMBULATORY_CARE_PROVIDER_SITE_OTHER): Payer: PPO | Admitting: Family Medicine

## 2021-04-22 ENCOUNTER — Other Ambulatory Visit: Payer: Self-pay

## 2021-04-22 VITALS — BP 135/66 | HR 74 | Temp 97.9°F | Ht 64.0 in | Wt 219.0 lb

## 2021-04-22 DIAGNOSIS — E1159 Type 2 diabetes mellitus with other circulatory complications: Secondary | ICD-10-CM | POA: Diagnosis not present

## 2021-04-22 DIAGNOSIS — Z1331 Encounter for screening for depression: Secondary | ICD-10-CM | POA: Diagnosis not present

## 2021-04-22 DIAGNOSIS — E785 Hyperlipidemia, unspecified: Secondary | ICD-10-CM

## 2021-04-22 DIAGNOSIS — R0602 Shortness of breath: Secondary | ICD-10-CM | POA: Diagnosis not present

## 2021-04-22 DIAGNOSIS — E1169 Type 2 diabetes mellitus with other specified complication: Secondary | ICD-10-CM | POA: Diagnosis not present

## 2021-04-22 DIAGNOSIS — I152 Hypertension secondary to endocrine disorders: Secondary | ICD-10-CM | POA: Diagnosis not present

## 2021-04-22 DIAGNOSIS — E559 Vitamin D deficiency, unspecified: Secondary | ICD-10-CM

## 2021-04-22 DIAGNOSIS — Z6841 Body Mass Index (BMI) 40.0 and over, adult: Secondary | ICD-10-CM

## 2021-04-22 DIAGNOSIS — R5383 Other fatigue: Secondary | ICD-10-CM | POA: Diagnosis not present

## 2021-04-22 DIAGNOSIS — Z0289 Encounter for other administrative examinations: Secondary | ICD-10-CM

## 2021-04-23 DIAGNOSIS — M25561 Pain in right knee: Secondary | ICD-10-CM | POA: Diagnosis not present

## 2021-04-23 DIAGNOSIS — M6281 Muscle weakness (generalized): Secondary | ICD-10-CM | POA: Diagnosis not present

## 2021-04-23 DIAGNOSIS — M7631 Iliotibial band syndrome, right leg: Secondary | ICD-10-CM | POA: Diagnosis not present

## 2021-04-23 DIAGNOSIS — R2681 Unsteadiness on feet: Secondary | ICD-10-CM | POA: Diagnosis not present

## 2021-04-23 LAB — LIPID PANEL WITH LDL/HDL RATIO
Cholesterol, Total: 129 mg/dL (ref 100–199)
HDL: 56 mg/dL (ref >39)
LDL Chol Calc (NIH): 51 mg/dL (ref 0–99)
LDL/HDL Ratio: 0.9 ratio (ref 0.0–3.2)
Triglycerides: 125 mg/dL (ref 0–149)
VLDL Cholesterol Cal: 22 mg/dL (ref 5–40)

## 2021-04-23 LAB — CBC WITH DIFFERENTIAL
Basophils Absolute: 0 10*3/uL (ref 0.0–0.2)
Basos: 1 %
EOS (ABSOLUTE): 0.3 10*3/uL (ref 0.0–0.4)
Eos: 4 %
Hematocrit: 38.8 % (ref 34.0–46.6)
Hemoglobin: 12.5 g/dL (ref 11.1–15.9)
Immature Grans (Abs): 0 10*3/uL (ref 0.0–0.1)
Immature Granulocytes: 0 %
Lymphocytes Absolute: 1.4 10*3/uL (ref 0.7–3.1)
Lymphs: 19 %
MCH: 27.6 pg (ref 26.6–33.0)
MCHC: 32.2 g/dL (ref 31.5–35.7)
MCV: 86 fL (ref 79–97)
Monocytes Absolute: 0.5 10*3/uL (ref 0.1–0.9)
Monocytes: 8 %
Neutrophils Absolute: 4.8 10*3/uL (ref 1.4–7.0)
Neutrophils: 68 %
RBC: 4.53 x10E6/uL (ref 3.77–5.28)
RDW: 14.8 % (ref 11.7–15.4)
WBC: 7 10*3/uL (ref 3.4–10.8)

## 2021-04-23 LAB — HEMOGLOBIN A1C
Est. average glucose Bld gHb Est-mCnc: 146 mg/dL
Hgb A1c MFr Bld: 6.7 % — ABNORMAL HIGH (ref 4.8–5.6)

## 2021-04-23 LAB — VITAMIN B12: Vitamin B-12: 104 pg/mL — ABNORMAL LOW (ref 232–1245)

## 2021-04-23 LAB — COMPREHENSIVE METABOLIC PANEL
ALT: 10 IU/L (ref 0–32)
AST: 19 IU/L (ref 0–40)
Albumin/Globulin Ratio: 2 (ref 1.2–2.2)
Albumin: 4.9 g/dL — ABNORMAL HIGH (ref 3.7–4.7)
Alkaline Phosphatase: 60 IU/L (ref 44–121)
BUN/Creatinine Ratio: 16 (ref 12–28)
BUN: 12 mg/dL (ref 8–27)
Bilirubin Total: 0.3 mg/dL (ref 0.0–1.2)
CO2: 22 mmol/L (ref 20–29)
Calcium: 9.8 mg/dL (ref 8.7–10.3)
Chloride: 102 mmol/L (ref 96–106)
Creatinine, Ser: 0.75 mg/dL (ref 0.57–1.00)
Globulin, Total: 2.4 g/dL (ref 1.5–4.5)
Glucose: 101 mg/dL — ABNORMAL HIGH (ref 65–99)
Potassium: 4.4 mmol/L (ref 3.5–5.2)
Sodium: 142 mmol/L (ref 134–144)
Total Protein: 7.3 g/dL (ref 6.0–8.5)
eGFR: 83 mL/min/{1.73_m2} (ref >59)

## 2021-04-23 LAB — INSULIN, RANDOM: INSULIN: 22.3 u[IU]/mL (ref 2.6–24.9)

## 2021-04-23 LAB — T4: T4, Total: 11 ug/dL (ref 4.5–12.0)

## 2021-04-23 LAB — T3: T3, Total: 154 ng/dL (ref 71–180)

## 2021-04-23 LAB — FOLATE: Folate: 6.6 ng/mL (ref >3.0)

## 2021-04-23 LAB — VITAMIN D 25 HYDROXY (VIT D DEFICIENCY, FRACTURES): Vit D, 25-Hydroxy: 40.7 ng/mL (ref 30.0–100.0)

## 2021-04-23 LAB — TSH: TSH: 1.68 u[IU]/mL (ref 0.450–4.500)

## 2021-04-28 NOTE — Progress Notes (Signed)
Chief Complaint:   OBESITY Lauren [name] (MR# [id number]) is a 75 y.o. female who presents for evaluation and treatment of obesity and related comorbidities. Current BMI is Body mass index is 37.59 kg/m. Lauren [name] has been struggling with her weight for many years and has been unsuccessful in either losing weight, maintaining weight loss, or reaching her healthy weight goal.  Lauren [name] is currently in the action stage of change and ready to dedicate time achieving and maintaining a healthier weight. Lauren [name] is interested in becoming our patient and working on intensive lifestyle modifications including (but not limited to) diet and exercise for weight loss.  Lauren [name]'s habits were reviewed today and are as follows: Her family eats meals together, she thinks her family will eat healthier with her, her desired weight loss is 69-79 lbs, she started gaining weight in her late 40's-early 50's, her heaviest weight ever was 228 pounds, she is a picky eater and doesn't like to eat healthier foods, she has significant food cravings issues, she is frequently drinking liquids with calories, she frequently makes poor food choices, she frequently eats larger portions than normal, and she struggles with emotional eating.  Depression Screen Lauren [name]'s Food and Mood (modified PHQ-9) score was 8.  Depression screen [facility] 2/9 04/22/2021  Decreased Interest 1  Down, Depressed, Hopeless 1  PHQ - 2 Score 2  Altered sleeping 2  Tired, decreased energy 2  Change in appetite 1  Feeling bad or failure about yourself  1  Trouble concentrating 0  Moving slowly or fidgety/restless 0  Suicidal thoughts 0  PHQ-9 Score 8  Difficult doing work/chores Not difficult at all  Some recent data might be hidden   Subjective:   1. Other fatigue Lauren [name] admits to daytime somnolence and admits to waking up still tired. Patent has a history of symptoms of daytime fatigue. Lauren [name] generally gets 6 hours of sleep per night,  and states that she has difficulty falling asleep. Snoring is present. Apneic episodes are present. [name] is 7.  2. SOB (shortness of breath) on exertion Lauren [name] notes increasing shortness of breath with exercising and seems to be worsening over time with weight gain. She notes getting out of breath sooner with activity than she used to. This has not gotten worse recently. Lauren [name] denies shortness of breath at rest or orthopnea.  3. Type 2 diabetes mellitus with other specified complication, without long-term current use of insulin (HCC) Lauren [name] is on pioglitazone, and she is working on diet and weight loss.  4. Hyperlipidemia associated with type 2 diabetes mellitus ([name]) Lauren [name] is working on diet and exercise, and she is due for labs.  5. Hypertension associated with diabetes ([place]) Lauren [name]'s blood pressure is stable, and she is ready to work on weight loss. She denies chest pain.  6. Vitamin D deficiency Lauren [name] is on [name] 5,000 IU daily, and she is due for labs.  Assessment/Plan:   1. Other fatigue Lauren [name] does feel that her weight is causing her energy to be lower than it should be. Fatigue may be related to obesity, depression or many other causes. Labs will be ordered, and in the meanwhile, Lauren [name] will focus on self care including making healthy food choices, increasing physical activity and focusing on stress reduction.  - Comprehensive metabolic panel - Vitamin Q59 - TSH - Lipid Panel With LDL/HDL Ratio - Folate - T3 - CBC With Differential - T4  2. SOB (shortness of breath) on exertion Lauren [name] does  feel that she gets out of breath more easily that she used to when she exercises. Lauren [name]'s shortness of breath appears to be obesity related and exercise induced. She has agreed to work on weight loss and gradually increase exercise to treat her exercise induced shortness of breath. Will continue to monitor closely.  3. Type 2 diabetes mellitus with  other specified complication, without long-term current use of insulin (HCC) We will check labs today. Lauren [name] will start her Category 2 plan. Good blood sugar control is important to decrease the likelihood of diabetic complications such as nephropathy, neuropathy, limb loss, blindness, coronary artery disease, and death. Intensive lifestyle modification including diet, exercise and weight loss are the first line of treatment for diabetes.   - Insulin, random - Hemoglobin A1c  4. Hyperlipidemia associated with type 2 diabetes mellitus (Lauren [name]) Cardiovascular risk and specific lipid/LDL goals reviewed.  We discussed several lifestyle modifications today. Lauren [name] will start her Category 2 plan, and will work on exercise and weight loss efforts. Orders and follow up as documented in patient record.   - Lipid Panel With LDL/HDL Ratio  5. Hypertension associated with diabetes ([place]) Lauren [name] will start her Category 2 plan, and will work on healthy weight loss and exercise to improve blood pressure control. We will watch for signs of hypotension as she continues her lifestyle modifications. We will check labs today.  - Comprehensive metabolic panel  6. Vitamin D deficiency Low Vitamin D level contributes to fatigue and are associated with obesity, breast, and colon cancer. We will check labs today. Lauren [name] will follow-up for routine testing of Vitamin D, at least 2-3 times per year to avoid over-replacement.  - VITAMIN D 25 Hydroxy (Vit-D Deficiency, Fractures)  7. Screening for depression [name] had a positive depression screening. Depression is commonly associated with obesity and often results in emotional eating behaviors. We will monitor this closely and work on CBT to help improve the non-hunger eating patterns. Referral to Psychology may be required if no improvement is seen as she continues in our clinic.  8. Obesity with current BMI 37.7 Lauren [name] is currently in the action stage of change and  her goal is to continue with weight loss efforts. I recommend [name] begin the structured treatment plan as follows:  She has agreed to the Category 2 Plan + 100 calories.  Exercise goals: No exercise has been prescribed for now, while we concentrate on nutritional changes.  Behavioral modification strategies: increasing lean protein intake and meal planning and cooking strategies.  She was informed of the importance of frequent follow-up visits to maximize her success with intensive lifestyle modifications for her multiple health conditions. She was informed we would discuss her lab results at her next visit unless there is a critical issue that needs to be addressed sooner. Kalayah agreed to keep her next visit at the agreed upon time to discuss these results.  Objective:   Blood pressure 135/66, pulse 74, temperature 97.9 F (36.6 C), height 5\' 4"  (1.626 m), weight 219 lb (99.3 kg), SpO2 96 %. Body mass index is 37.59 kg/m.  EKG: Normal sinus rhythm, rate 66 BPM.  Indirect Calorimeter completed today shows a VO2 of 269 and a REE of 1872.  Her calculated basal metabolic rate is 4627 thus her basal metabolic rate is better than expected.  General: Cooperative, alert, well developed, in no acute distress. HEENT: Conjunctivae and lids unremarkable. Cardiovascular: Regular rhythm.  Lungs: Normal work of breathing. Neurologic: No focal deficits.   Lab Results  Component Value Date   CREATININE 0.75 04/22/2021   BUN 12 04/22/2021   NA 142 04/22/2021   K 4.4 04/22/2021   CL 102 04/22/2021   CO2 22 04/22/2021   Lab Results  Component Value Date   ALT 10 04/22/2021   AST 19 04/22/2021   ALKPHOS 60 04/22/2021   BILITOT 0.3 04/22/2021   Lab Results  Component Value Date   HGBA1C 6.7 (H) 04/22/2021   HGBA1C 8.9 (H) 11/13/2018   HGBA1C 7.7 (H) 11/25/2017   HGBA1C 7.2 (H) 08/24/2017   HGBA1C 6.7 (H) 11/16/2016   Lab Results  Component Value Date   INSULIN 22.3 04/22/2021    Lab Results  Component Value Date   TSH 1.680 04/22/2021   Lab Results  Component Value Date   CHOL 129 04/22/2021   HDL 56 04/22/2021   LDLCALC 51 04/22/2021   TRIG 125 04/22/2021   Lab Results  Component Value Date   WBC 7.0 04/22/2021   HGB 12.5 04/22/2021   HCT 38.8 04/22/2021   MCV 86 04/22/2021   PLT 258 08/05/2020   No results found for: IRON, TIBC, FERRITIN  Attestation Statements:   Reviewed by clinician on day of visit: allergies, medications, problem list, medical history, surgical history, family history, social history, and previous encounter notes.  Time spent on visit including pre-visit chart review and post-visit charting and care was 60 minutes.    I, Trixie [name], am acting as transcriptionist for Dennard Nip, MD.  I have reviewed the above documentation for accuracy and completeness, and I agree with the above. - Dennard Nip, MD

## 2021-04-30 DIAGNOSIS — M6281 Muscle weakness (generalized): Secondary | ICD-10-CM | POA: Diagnosis not present

## 2021-04-30 DIAGNOSIS — M25561 Pain in right knee: Secondary | ICD-10-CM | POA: Diagnosis not present

## 2021-04-30 DIAGNOSIS — R2681 Unsteadiness on feet: Secondary | ICD-10-CM | POA: Diagnosis not present

## 2021-04-30 DIAGNOSIS — M7631 Iliotibial band syndrome, right leg: Secondary | ICD-10-CM | POA: Diagnosis not present

## 2021-05-06 ENCOUNTER — Ambulatory Visit (INDEPENDENT_AMBULATORY_CARE_PROVIDER_SITE_OTHER): Payer: PPO | Admitting: Family Medicine

## 2021-05-06 ENCOUNTER — Encounter (INDEPENDENT_AMBULATORY_CARE_PROVIDER_SITE_OTHER): Payer: Self-pay | Admitting: Family Medicine

## 2021-05-06 ENCOUNTER — Other Ambulatory Visit: Payer: Self-pay

## 2021-05-06 VITALS — BP 138/81 | HR 77 | Temp 98.2°F | Ht 64.0 in | Wt 212.0 lb

## 2021-05-06 DIAGNOSIS — E1169 Type 2 diabetes mellitus with other specified complication: Secondary | ICD-10-CM

## 2021-05-06 DIAGNOSIS — E538 Deficiency of other specified B group vitamins: Secondary | ICD-10-CM | POA: Diagnosis not present

## 2021-05-06 DIAGNOSIS — E559 Vitamin D deficiency, unspecified: Secondary | ICD-10-CM | POA: Diagnosis not present

## 2021-05-06 DIAGNOSIS — Z6841 Body Mass Index (BMI) 40.0 and over, adult: Secondary | ICD-10-CM

## 2021-05-06 MED ORDER — VITAMIN D (ERGOCALCIFEROL) 1.25 MG (50000 UNIT) PO CAPS: 50000.0000 [IU] | ORAL_CAPSULE | ORAL | 0 refills | Status: DC

## 2021-05-07 DIAGNOSIS — M7631 Iliotibial band syndrome, right leg: Secondary | ICD-10-CM | POA: Diagnosis not present

## 2021-05-07 DIAGNOSIS — R2681 Unsteadiness on feet: Secondary | ICD-10-CM | POA: Diagnosis not present

## 2021-05-07 DIAGNOSIS — M150 Primary generalized (osteo)arthritis: Secondary | ICD-10-CM | POA: Diagnosis not present

## 2021-05-07 DIAGNOSIS — Z79899 Other long term (current) drug therapy: Secondary | ICD-10-CM | POA: Diagnosis not present

## 2021-05-07 DIAGNOSIS — Z79891 Long term (current) use of opiate analgesic: Secondary | ICD-10-CM | POA: Diagnosis not present

## 2021-05-07 DIAGNOSIS — M25561 Pain in right knee: Secondary | ICD-10-CM | POA: Diagnosis not present

## 2021-05-07 DIAGNOSIS — G894 Chronic pain syndrome: Secondary | ICD-10-CM | POA: Diagnosis not present

## 2021-05-07 DIAGNOSIS — M6281 Muscle weakness (generalized): Secondary | ICD-10-CM | POA: Diagnosis not present

## 2021-05-08 DIAGNOSIS — R2681 Unsteadiness on feet: Secondary | ICD-10-CM | POA: Diagnosis not present

## 2021-05-08 DIAGNOSIS — I10 Essential (primary) hypertension: Secondary | ICD-10-CM | POA: Diagnosis not present

## 2021-05-08 DIAGNOSIS — M6281 Muscle weakness (generalized): Secondary | ICD-10-CM | POA: Diagnosis not present

## 2021-05-08 DIAGNOSIS — M25561 Pain in right knee: Secondary | ICD-10-CM | POA: Diagnosis not present

## 2021-05-08 DIAGNOSIS — E7800 Pure hypercholesterolemia, unspecified: Secondary | ICD-10-CM | POA: Diagnosis not present

## 2021-05-08 DIAGNOSIS — C50919 Malignant neoplasm of unspecified site of unspecified female breast: Secondary | ICD-10-CM | POA: Diagnosis not present

## 2021-05-08 DIAGNOSIS — E1169 Type 2 diabetes mellitus with other specified complication: Secondary | ICD-10-CM | POA: Diagnosis not present

## 2021-05-08 DIAGNOSIS — M7631 Iliotibial band syndrome, right leg: Secondary | ICD-10-CM | POA: Diagnosis not present

## 2021-05-08 DIAGNOSIS — K219 Gastro-esophageal reflux disease without esophagitis: Secondary | ICD-10-CM | POA: Diagnosis not present

## 2021-05-13 NOTE — Progress Notes (Signed)
Chief Complaint:   OBESITY Lauren [name] is here to discuss her progress with her obesity treatment plan along with follow-up of her obesity related diagnoses. Lauren [name] is on the Category 2 Plan and states she is following her eating plan approximately 90% of the time. Lauren [name] states she is doing physical therapy for 60 minutes 2 times per week.  Today's visit was #: 2 Starting weight: 219 lbs Starting date: 04/22/2021 Today's weight: 212 lbs Today's date: 05/06/2021 Total lbs lost to date: 7 Total lbs lost since last in-office visit: 7  Interim History: Lauren [name] has done well with weight loss on her Category 2 plan. She struggled to eat all of her protein and really didn't like her lunch options.  Subjective:   1. Type 2 diabetes mellitus with other specified complication, without long-term current use of insulin (HCC) Lauren [name]'s fasting BGs mostly range in the 90's in the last 2 weeks, while on her Category 2 plan. I discussed labs with the patient today.  2. Vitamin D deficiency Lauren [name] is on Vit D OTC, but her level is not yet at goal. I discussed labs with the patient today.  3. B12 deficiency Lauren [name]'s B12 is very low. She is on a B-complex vitamin but her labs suggests she is not eating enough protein, and she confirmed that her "adequate" protein eating plan was significantly more than she was used to eating. I discussed labs with the patient today.  Assessment/Plan:   1. Type 2 diabetes mellitus with other specified complication, without long-term current use of insulin (HCC) Lauren [name] will continue metformin and her eating plan. We will recheck labs in 3 months. Good blood sugar control is important to decrease the likelihood of diabetic complications such as nephropathy, neuropathy, limb loss, blindness, coronary artery disease, and death. Intensive lifestyle modification including diet, exercise and weight loss are the first line of treatment for diabetes.   2. Vitamin D  deficiency Low Vitamin D level contributes to fatigue and are associated with obesity, breast, and colon cancer. Lauren [name] agreed to start prescription Vitamin D 50,000 IU every week with no refills, and continue [name] as is. She will follow-up for routine testing of Vitamin D, at least 2-3 times per year to avoid over-replacement.  - Vitamin D, Ergocalciferol, (DRISDOL) 1.25 MG (50000 UNIT) CAPS capsule; Take 1 capsule (50,000 Units total) by mouth every 7 (seven) days.  Dispense: 4 capsule; Refill: 0  3. B12 deficiency The diagnosis was reviewed with the patient. Lauren [name] will continue a B12 rich plan and we will recheck labs in 3 months. Orders and follow up as documented in patient record.  4. Obesity with current BMI 36.5 Lauren [name] is currently in the action stage of change. As such, her goal is to continue with weight loss efforts. She has agreed to the Category 2 Plan + 100 calories and keeping a food journal and adhering to recommended goals of 300-500 calories and 30+ grams of protein at lunch daily.   Exercise goals: As is.  Behavioral modification strategies: increasing lean protein intake and decreasing simple carbohydrates.  Lauren [name] has agreed to follow-up with our clinic in 2 weeks. She was informed of the importance of frequent follow-up visits to maximize her success with intensive lifestyle modifications for her multiple health conditions.   Objective:   Blood pressure 138/81, pulse 77, temperature 98.2 F (36.8 C), height 5\' 4"  (1.626 m), weight 212 lb (96.2 kg), SpO2 95 %. Body mass index is 36.39 kg/m.  General:  Cooperative, alert, well developed, in no acute distress. HEENT: Conjunctivae and lids unremarkable. Cardiovascular: Regular rhythm.  Lungs: Normal work of breathing. Neurologic: No focal deficits.   Lab Results  Component Value Date   CREATININE 0.75 04/22/2021   BUN 12 04/22/2021   NA 142 04/22/2021   K 4.4 04/22/2021   CL 102 04/22/2021   CO2 22  04/22/2021   Lab Results  Component Value Date   ALT 10 04/22/2021   AST 19 04/22/2021   ALKPHOS 60 04/22/2021   BILITOT 0.3 04/22/2021   Lab Results  Component Value Date   HGBA1C 6.7 (H) 04/22/2021   HGBA1C 8.9 (H) 11/13/2018   HGBA1C 7.7 (H) 11/25/2017   HGBA1C 7.2 (H) 08/24/2017   HGBA1C 6.7 (H) 11/16/2016   Lab Results  Component Value Date   INSULIN 22.3 04/22/2021   Lab Results  Component Value Date   TSH 1.680 04/22/2021   Lab Results  Component Value Date   CHOL 129 04/22/2021   HDL 56 04/22/2021   LDLCALC 51 04/22/2021   TRIG 125 04/22/2021   Lab Results  Component Value Date   VD25OH 40.7 04/22/2021   Lab Results  Component Value Date   WBC 7.0 04/22/2021   HGB 12.5 04/22/2021   HCT 38.8 04/22/2021   MCV 86 04/22/2021   PLT 258 08/05/2020   No results found for: IRON, TIBC, FERRITIN  Obesity Behavioral Intervention:   Approximately 15 minutes were spent on the discussion below.  ASK: We discussed the diagnosis of obesity with Lauren [name] today and Lauren [name] agreed to give [place] permission to discuss obesity behavioral modification therapy today.  ASSESS: Lauren [name] has the diagnosis of obesity and her BMI today is 36.37. Lauren [name] is in the action stage of change.   ADVISE: Lauren [name] was educated on the multiple health risks of obesity as well as the benefit of weight loss to improve her health. She was advised of the need for long term treatment and the importance of lifestyle modifications to improve her current health and to decrease her risk of future health problems.  AGREE: Multiple dietary modification options and treatment options were discussed and Lauren [name] agreed to follow the recommendations documented in the above note.  ARRANGE: Lauren [name] was educated on the importance of frequent visits to treat obesity as outlined per CMS and USPSTF guidelines and agreed to schedule her next follow up appointment today.  Attestation Statements:   Reviewed by  clinician on day of visit: allergies, medications, problem list, medical history, surgical history, family history, social history, and previous encounter notes.   I, Trixie [name], am acting as transcriptionist for Dennard Nip, MD.  I have reviewed the above documentation for accuracy and completeness, and I agree with the above. -  Dennard Nip, MD

## 2021-05-14 DIAGNOSIS — M25561 Pain in right knee: Secondary | ICD-10-CM | POA: Diagnosis not present

## 2021-05-14 DIAGNOSIS — M7631 Iliotibial band syndrome, right leg: Secondary | ICD-10-CM | POA: Diagnosis not present

## 2021-05-14 DIAGNOSIS — R2681 Unsteadiness on feet: Secondary | ICD-10-CM | POA: Diagnosis not present

## 2021-05-14 DIAGNOSIS — M6281 Muscle weakness (generalized): Secondary | ICD-10-CM | POA: Diagnosis not present

## 2021-05-19 DIAGNOSIS — M6281 Muscle weakness (generalized): Secondary | ICD-10-CM | POA: Diagnosis not present

## 2021-05-19 DIAGNOSIS — M7631 Iliotibial band syndrome, right leg: Secondary | ICD-10-CM | POA: Diagnosis not present

## 2021-05-19 DIAGNOSIS — R2681 Unsteadiness on feet: Secondary | ICD-10-CM | POA: Diagnosis not present

## 2021-05-19 DIAGNOSIS — M25561 Pain in right knee: Secondary | ICD-10-CM | POA: Diagnosis not present

## 2021-05-20 ENCOUNTER — Encounter (INDEPENDENT_AMBULATORY_CARE_PROVIDER_SITE_OTHER): Payer: Self-pay | Admitting: Family Medicine

## 2021-05-20 ENCOUNTER — Other Ambulatory Visit: Payer: Self-pay

## 2021-05-20 ENCOUNTER — Ambulatory Visit (INDEPENDENT_AMBULATORY_CARE_PROVIDER_SITE_OTHER): Payer: PPO | Admitting: Family Medicine

## 2021-05-20 VITALS — BP 135/82 | HR 75 | Temp 98.0°F | Ht 64.0 in | Wt 210.0 lb

## 2021-05-20 DIAGNOSIS — F3289 Other specified depressive episodes: Secondary | ICD-10-CM | POA: Diagnosis not present

## 2021-05-20 DIAGNOSIS — Z6837 Body mass index (BMI) 37.0-37.9, adult: Secondary | ICD-10-CM

## 2021-05-20 DIAGNOSIS — E1169 Type 2 diabetes mellitus with other specified complication: Secondary | ICD-10-CM | POA: Diagnosis not present

## 2021-05-20 MED ORDER — BUPROPION HCL ER (SR) 150 MG PO TB12: 150.0000 mg | ORAL_TABLET | Freq: Every morning | ORAL | 0 refills | Status: DC

## 2021-05-25 ENCOUNTER — Ambulatory Visit: Payer: PPO | Admitting: Orthopaedic Surgery

## 2021-05-25 ENCOUNTER — Encounter: Payer: Self-pay | Admitting: Orthopaedic Surgery

## 2021-05-25 DIAGNOSIS — G8929 Other chronic pain: Secondary | ICD-10-CM

## 2021-05-25 DIAGNOSIS — M25561 Pain in right knee: Secondary | ICD-10-CM | POA: Diagnosis not present

## 2021-05-25 DIAGNOSIS — M1711 Unilateral primary osteoarthritis, right knee: Secondary | ICD-10-CM

## 2021-05-25 MED ORDER — METHYLPREDNISOLONE ACETATE 40 MG/ML IJ SUSP
40.0000 mg | INTRAMUSCULAR | Status: AC | PRN
  Administered 2021-05-25: 40 mg via INTRA_ARTICULAR

## 2021-05-25 MED ORDER — LIDOCAINE HCL 1 % IJ SOLN
3.0000 mL | INTRAMUSCULAR | Status: AC | PRN
  Administered 2021-05-25: 3 mL

## 2021-05-25 NOTE — Progress Notes (Signed)
Office Visit Note   Patient: Lauren [name]           Date of Birth: 1946/03/16           MRN: [id number] Visit Date: 05/25/2021              Requested by: Lawerance Cruel, [place],  [place] [id number] PCP: Lawerance Cruel, MD   Assessment & Plan: Visit Diagnoses:  1. Unilateral primary osteoarthritis, right knee   2. Chronic pain of right knee     Plan: Per the patient's request I did provide a steroid injection in her right knee today which she tolerated well.  My next step for her would be considering knee replacement surgery for when she is ready.  Having had this before she is fully aware what the surgery involves including the risk and benefits.  She would like to make arrangements for her husband's care before scheduling the surgery so she will let us know.  Follow-Up Instructions: Return if symptoms worsen or fail to improve.   Orders:  Orders Placed This Encounter  Procedures   Large Joint Inj    No orders of the defined types were placed in this encounter.     Procedures: Large Joint Inj: R knee on 05/25/2021 4:09 PM Indications: diagnostic evaluation and pain Details: 22 G 1.5 in needle, superolateral approach  Arthrogram: No  Medications: 3 mL lidocaine 1 %; 40 mg methylPREDNISolone acetate 40 MG/ML Outcome: tolerated well, no immediate complications Procedure, treatment alternatives, risks and benefits explained, specific risks discussed. Consent was given by the patient. Immediately prior to procedure a time out was called to verify the correct patient, procedure, equipment, support staff and site/side marked as required. Patient was prepped and draped in the usual sterile fashion.      Clinical Data: No additional findings.   Subjective: Chief Complaint  Patient presents with   Right Knee - Pain  The patient is very well-known to me.  We have actually replaced her left knee before.  Her right knee has well-documented  severe arthritis.  She has tried and failed all forms of conservative treatment with the most recent injection being hyaluronic acid.  She is definitely having right knee pain that is definitely affecting her mobility, her quality of life and her actives daily living.  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  She is also in Dr. Migdalia Dk weight loss clinic.  Her BMI is in acceptable range for [place] to perform a knee replacement at this point and her hemoglobin A1c is below 7.  She is requesting at least a steroid injection in her right knee today.  She wanted me to take a look at her left wrist but she has had multiple surgeries by Dr. Burney Gauze on that left wrist and she has had some recent swelling with the wrist.  I recommend that she see Dr. Burney Gauze for her wrist.  HPI  Review of Systems There is currently listed no headache, chest pain, shortness of breath, fever, chills, nausea, vomiting  Objective: Vital Signs: There were no vitals taken for this visit.  Physical Exam She is alert and oriented x3 and in no acute distress Ortho Exam Examination right knee does show varus malalignment.  There is a mild effusion and slight warmth in the knee.  There is patellofemoral crepitation and pain throughout the arc of motion of the right knee. Specialty Comments:  No specialty comments available.  Imaging: No results found. Previous x-rays of the right knee show tricompartment arthritis with varus malalignment, medial joint space narrowing, patellofemoral narrowing and osteophytes in all 3 compartments.  PMFS History: Patient Active Problem List   Diagnosis Date Noted   Other fatigue 04/22/2021   SOB (shortness of breath) on exertion 04/22/2021   Lower extremity edema 03/05/2021    Coronary artery calcification 02/20/2020   Pure hypercholesterolemia 02/20/2020   Unilateral primary osteoarthritis, right knee 01/03/2019   Genetic testing 12/07/2018   Family history of breast cancer    Family history of prostate cancer    Family history of colon cancer    Family history of kidney cancer    Malignant neoplasm of upper-outer quadrant of right breast in female, estrogen receptor positive ([place]) 11/09/2018   Diabetic neuropathy, type II diabetes mellitus ([place]) 11/09/2018   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/02/2017   Trochanteric bursitis, right hip 05/10/2017   Primary osteoarthritis of left knee 03/09/2017   Unilateral primary osteoarthritis, right hip 11/26/2016   Status post total replacement of right hip 11/26/2016   DDD (degenerative disc disease), 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   Low back pain 04/23/2015   Hiatal hernia 04/13/2013   Type II or unspecified type diabetes mellitus without mention of complication, uncontrolled [id number]   Umbilical hernia [id number]   Essential hypertension    Past Medical History:  Diagnosis Date   Anxiety    Arthritis    Back, Hip- right   Back pain    Cancer (HCC)    right breast   Cataract    Colitis    Coronary artery calcification 02/20/2020   Diabetes mellitus without complication (HCC)    Type II   Edema 03/05/2021   Edema of both lower extremities    Endometriosis    Family history of adverse reaction to anesthesia    Father - N/V - blood pressure; daughter N/V   Family history of breast cancer    Family history of colon cancer    Family history of kidney cancer    Family history of prostate cancer    Fatty liver    GERD (gastroesophageal reflux disease)    History of kidney stones    History of ulcerative colitis    Hypertension    Insomnia    Joint pain    Kidney problem    Lower extremity edema 03/05/2021   Lumbar disc disease    L4 L5   Neuropathy    Osteoarthritis    Other hyperlipidemia     Palpitations    Personal history of radiation therapy    Pneumonia    1983, 2003   Pure hypercholesterolemia 02/20/2020   PVCs (premature ventricular contractions)    benign   Umbilical hernia    Vitamin D deficiency    Wears glasses     Family History  Problem Relation Age of Onset   Hyperlipidemia Mother    Diabetes Mother    Hypertension Mother    Parkinson's disease Mother    Kidney disease Mother    Kidney disease Father    Stroke Father    Heart failure Father    Hypertension Father    Heart disease Father    Diabetes Father    Cancer Father  colon, prostate, and kidney   Heart disease Maternal Grandmother    Diabetes Maternal Grandmother    Heart disease Maternal Grandfather    Diabetes Maternal Grandfather    Cancer Maternal Grandfather        pt unaware of what kind   Stroke Paternal Grandmother    Stroke Paternal Grandfather    Cancer Maternal Aunt        cervical vs ovarian   Colon cancer Paternal Aunt        dx less than 22   Breast cancer Paternal Aunt        dx in her 45s   Prostate cancer Paternal Uncle    Breast cancer Cousin        pat first cousin, dx over 55   Prostate cancer Other        PGF's father    Past Surgical History:  Procedure Laterality Date   APPENDECTOMY     BREAST BIOPSY  08-13-2009  DR TSUEI   EXCISION LEFT NIPPLE DUCT   BREAST EXCISIONAL BIOPSY Left    x2   BREAST EXCISIONAL BIOPSY Right    BREAST LUMPECTOMY Right 11/16/2018   invasive ductal   CHOLECYSTECTOMY  1992   COLON RESECTION  1986   ULCERATIVE COLITIS   COLON SURGERY     ESOPHAGEAL MANOMETRY N/A 06/11/2013   Procedure: ESOPHAGEAL MANOMETRY (EM);  Surgeon: Jeryl Columbia, MD;  Location: [facility];  Service: Endoscopy;  Laterality: N/A;   ESOPHAGOGASTRODUODENOSCOPY (EGD) WITH PROPOFOL N/A 05/31/2016   Procedure: ESOPHAGOGASTRODUODENOSCOPY (EGD) WITH PROPOFOL;  Surgeon: Clarene Essex, MD;  Location[facility];  Service: Endoscopy;  Laterality: N/A;    EXCISION RIGHT BREAST MASS   10-20-2005  DR Collier Salina YOUNG   FRACTURE SURGERY     left arm   kidney stone removed  2/11   KNEE ARTHROSCOPY Left    MCL   LEFT THUMB CARPOMETACARPAL JOINT SUSPENSIONPLASTY  04-06-2006  DR Burney Gauze   LEFT WRIST ARTHROSCOPY W/ DEBRIDEMENT AND REMOVAL CYST  03-26-2004  DR Burney Gauze   MYOMECTOMY     RADIOACTIVE SEED GUIDED PARTIAL MASTECTOMY WITH AXILLARY SENTINEL LYMPH NODE BIOPSY Right 11/16/2018   Procedure: RIGHT BREAST RADIOACTIVE SEED GUIDED PARTIAL MASTECTOMY WITH  SENTINEL  NODE BIOPSY;  Surgeon: Coralie Keens, MD;  Location: [facility];  Service: General;  Laterality: Right;   RIGHT COLECTOMY     RIGHT URETEROSCOPIC STONE EXTRACTION  12-11-2009  DR Cassandra HIP ARTHROPLASTY Right 11/26/2016   Procedure: RIGHT TOTAL HIP ARTHROPLASTY ANTERIOR APPROACH;  Surgeon: Mcarthur Rossetti, MD;  Location: [facility];  Service: Orthopedics;  Laterality: Right;   TOTAL KNEE ARTHROPLASTY Left 12/02/2017   Procedure: LEFT TOTAL KNEE ARTHROPLASTY;  Surgeon: Mcarthur Rossetti, MD;  Location: [facility];  Service: Orthopedics;  Laterality: Left;   WRIST SURGERY     both   Social History   Occupational History   Occupation: Retired  Tobacco Use   Smoking status: Former    Packs/day: 1.00    Years: 10.00    Pack years: 10.00    Types: Cigarettes    Quit date: 09/22/1982    Years since quitting: 38.6   Smokeless tobacco: Never   Tobacco comments:    quit 1983  Vaping Use   Vaping Use: Never used  Substance and Sexual Activity   Alcohol use: No   Drug use: No   Sexual activity: Yes  Partners: Male    Birth control/protection: Post-menopausal

## 2021-05-26 DIAGNOSIS — R2681 Unsteadiness on feet: Secondary | ICD-10-CM | POA: Diagnosis not present

## 2021-05-26 DIAGNOSIS — M7631 Iliotibial band syndrome, right leg: Secondary | ICD-10-CM | POA: Diagnosis not present

## 2021-05-26 DIAGNOSIS — M6281 Muscle weakness (generalized): Secondary | ICD-10-CM | POA: Diagnosis not present

## 2021-05-26 DIAGNOSIS — M25561 Pain in right knee: Secondary | ICD-10-CM | POA: Diagnosis not present

## 2021-05-27 ENCOUNTER — Other Ambulatory Visit (INDEPENDENT_AMBULATORY_CARE_PROVIDER_SITE_OTHER): Payer: Self-pay | Admitting: Family Medicine

## 2021-05-27 DIAGNOSIS — F3289 Other specified depressive episodes: Secondary | ICD-10-CM

## 2021-05-27 NOTE — Progress Notes (Signed)
Chief Complaint:   OBESITY Lauren [name] is here to discuss her progress with her obesity treatment plan along with follow-up of her obesity related diagnoses. [name] is on the Category 2 Plan + 100 calories and keeping a food journal and adhering to recommended goals of 300-500 calories and 30+ grams of protein at lunch daily and states she is following her eating plan approximately 75-80% of the time. [name] states she is doing physical therapy for 60 minutes 2 times per week.  Today's visit was #: 3 Starting weight: 219 lbs Starting date: 04/22/2021 Today's weight: 210 lbs Today's date: 05/20/2021 Total lbs lost to date: 9 Total lbs lost since last in-office visit: 2  Interim History: [name] continues to do well with weight loss. She was able to journal for lunch but she struggled to get all of her protein.  Subjective:   1. Type 2 diabetes mellitus with other specified complication, without long-term current use of insulin (HCC) [name]'s fasting BGs mostly range in the 90's. She is doing well with diet and exercise, and she denies hypoglycemia.  2. Other depression with emotional eating [name] notes struggling with some emotional eating behaviors, especially with increased stress with her husband's health. She is open to trying medications to help.  Assessment/Plan:   1. Type 2 diabetes mellitus with other specified complication, without long-term current use of insulin (HCC) [name] will continue with diet and exercise, and we will recheck labs in 2 months. Good blood sugar control is important to decrease the likelihood of diabetic complications such as nephropathy, neuropathy, limb loss, blindness, coronary artery disease, and death. Intensive lifestyle modification including diet, exercise and weight loss are the first line of treatment for diabetes.   2. Other depression with emotional eating Behavior modification techniques were discussed today to help [name] deal with  her emotional/non-hunger eating behaviors. [name] agreed to start Wellbutrin SR 150 mg q AM with no refills. Orders and follow up as documented in patient record.   - buPROPion (WELLBUTRIN SR) 150 MG 12 hr tablet; Take 1 tablet (150 mg total) by mouth every morning.  Dispense: 30 tablet; Refill: 0  3. Obesity with current BMI 36.1 [name] is currently in the action stage of change. As such, her goal is to continue with weight loss efforts. She has agreed to the Category 2 Plan and keeping a food journal and adhering to recommended goals of 350-500 calories and 30+ grams of protein at lunch daily.   Exercise goals: As is.  Behavioral modification strategies: increasing lean protein intake and meal planning and cooking strategies.  [name] has agreed to follow-up with our clinic in 2 weeks. She was informed of the importance of frequent follow-up visits to maximize her success with intensive lifestyle modifications for her multiple health conditions.   Objective:   Blood pressure 135/82, pulse 75, temperature 98 F (36.7 C), height '5\' 4"'$  (1.626 m), weight 210 lb (95.3 kg), SpO2 96 %. Body mass index is 36.05 kg/m.  General: Cooperative, alert, well developed, in no acute distress. HEENT: Conjunctivae and lids unremarkable. Cardiovascular: Regular rhythm.  Lungs: Normal work of breathing. Neurologic: No focal deficits.   Lab Results  Component Value Date   CREATININE 0.75 04/22/2021   BUN 12 04/22/2021   NA 142 04/22/2021   K 4.4 04/22/2021   CL 102 04/22/2021   CO2 22 04/22/2021   Lab Results  Component Value Date   ALT 10 04/22/2021   AST 19 04/22/2021   ALKPHOS  60 04/22/2021   BILITOT 0.3 04/22/2021   Lab Results  Component Value Date   HGBA1C 6.7 (H) 04/22/2021   HGBA1C 8.9 (H) 11/13/2018   HGBA1C 7.7 (H) 11/25/2017   HGBA1C 7.2 (H) 08/24/2017   HGBA1C 6.7 (H) 11/16/2016   Lab Results  Component Value Date   INSULIN 22.3 04/22/2021   Lab Results  Component  Value Date   TSH 1.680 04/22/2021   Lab Results  Component Value Date   CHOL 129 04/22/2021   HDL 56 04/22/2021   LDLCALC 51 04/22/2021   TRIG 125 04/22/2021   Lab Results  Component Value Date   VD25OH 40.7 04/22/2021   Lab Results  Component Value Date   WBC 7.0 04/22/2021   HGB 12.5 04/22/2021   HCT 38.8 04/22/2021   MCV 86 04/22/2021   PLT 258 08/05/2020   No results found for: IRON, TIBC, FERRITIN  Obesity Behavioral Intervention:   Approximately 15 minutes were spent on the discussion below.  ASK: We discussed the diagnosis of obesity with [name] today and [name] agreed to give [place] permission to discuss obesity behavioral modification therapy today.  ASSESS: Danaysia has the diagnosis of obesity and her BMI today is 36.03. Adorabella is in the action stage of change.   ADVISE: [name] was educated on the multiple health risks of obesity as well as the benefit of weight loss to improve her health. She was advised of the need for long term treatment and the importance of lifestyle modifications to improve her current health and to decrease her risk of future health problems.  AGREE: Multiple dietary modification options and treatment options were discussed and [name] agreed to follow the recommendations documented in the above note.  ARRANGE: [name] was educated on the importance of frequent visits to treat obesity as outlined per CMS and USPSTF guidelines and agreed to schedule her next follow up appointment today.  Attestation Statements:   Reviewed by clinician on day of visit: allergies, medications, problem list, medical history, surgical history, family history, social history, and previous encounter notes.   I, Trixie [name], am acting as transcriptionist for Dennard Nip, MD.  I have reviewed the above documentation for accuracy and completeness, and I agree with the above. -  Dennard Nip, MD

## 2021-05-27 NOTE — Telephone Encounter (Signed)
Dr.Beasley 

## 2021-05-28 DIAGNOSIS — R2681 Unsteadiness on feet: Secondary | ICD-10-CM | POA: Diagnosis not present

## 2021-05-28 DIAGNOSIS — M6281 Muscle weakness (generalized): Secondary | ICD-10-CM | POA: Diagnosis not present

## 2021-05-28 DIAGNOSIS — M25561 Pain in right knee: Secondary | ICD-10-CM | POA: Diagnosis not present

## 2021-05-28 DIAGNOSIS — M7631 Iliotibial band syndrome, right leg: Secondary | ICD-10-CM | POA: Diagnosis not present

## 2021-06-02 DIAGNOSIS — R2681 Unsteadiness on feet: Secondary | ICD-10-CM | POA: Diagnosis not present

## 2021-06-02 DIAGNOSIS — M6281 Muscle weakness (generalized): Secondary | ICD-10-CM | POA: Diagnosis not present

## 2021-06-02 DIAGNOSIS — M25561 Pain in right knee: Secondary | ICD-10-CM | POA: Diagnosis not present

## 2021-06-02 DIAGNOSIS — M7631 Iliotibial band syndrome, right leg: Secondary | ICD-10-CM | POA: Diagnosis not present

## 2021-06-03 ENCOUNTER — Other Ambulatory Visit: Payer: Self-pay

## 2021-06-03 ENCOUNTER — Ambulatory Visit (INDEPENDENT_AMBULATORY_CARE_PROVIDER_SITE_OTHER): Payer: PPO | Admitting: Family Medicine

## 2021-06-03 ENCOUNTER — Encounter (INDEPENDENT_AMBULATORY_CARE_PROVIDER_SITE_OTHER): Payer: Self-pay | Admitting: Family Medicine

## 2021-06-03 VITALS — BP 118/71 | HR 81 | Temp 98.0°F | Ht 64.0 in | Wt 202.0 lb

## 2021-06-03 DIAGNOSIS — E559 Vitamin D deficiency, unspecified: Secondary | ICD-10-CM | POA: Diagnosis not present

## 2021-06-03 DIAGNOSIS — E66812 Obesity, class 2: Secondary | ICD-10-CM | POA: Insufficient documentation

## 2021-06-03 DIAGNOSIS — F3289 Other specified depressive episodes: Secondary | ICD-10-CM | POA: Diagnosis not present

## 2021-06-03 DIAGNOSIS — Z6837 Body mass index (BMI) 37.0-37.9, adult: Secondary | ICD-10-CM

## 2021-06-03 MED ORDER — VITAMIN D (ERGOCALCIFEROL) 1.25 MG (50000 UNIT) PO CAPS: 50000.0000 [IU] | ORAL_CAPSULE | ORAL | 0 refills | Status: DC

## 2021-06-04 ENCOUNTER — Inpatient Hospital Stay: Payer: PPO

## 2021-06-04 ENCOUNTER — Inpatient Hospital Stay: Payer: PPO | Attending: Oncology | Admitting: Oncology

## 2021-06-04 VITALS — BP 144/65 | HR 67 | Temp 97.9°F | Resp 16 | Ht 64.0 in | Wt 206.7 lb

## 2021-06-04 DIAGNOSIS — Z79899 Other long term (current) drug therapy: Secondary | ICD-10-CM | POA: Insufficient documentation

## 2021-06-04 DIAGNOSIS — K760 Fatty (change of) liver, not elsewhere classified: Secondary | ICD-10-CM | POA: Insufficient documentation

## 2021-06-04 DIAGNOSIS — Z87442 Personal history of urinary calculi: Secondary | ICD-10-CM | POA: Insufficient documentation

## 2021-06-04 DIAGNOSIS — I10 Essential (primary) hypertension: Secondary | ICD-10-CM | POA: Insufficient documentation

## 2021-06-04 DIAGNOSIS — C50411 Malignant neoplasm of upper-outer quadrant of right female breast: Secondary | ICD-10-CM | POA: Diagnosis not present

## 2021-06-04 DIAGNOSIS — Z7984 Long term (current) use of oral hypoglycemic drugs: Secondary | ICD-10-CM | POA: Insufficient documentation

## 2021-06-04 DIAGNOSIS — E119 Type 2 diabetes mellitus without complications: Secondary | ICD-10-CM | POA: Diagnosis not present

## 2021-06-04 DIAGNOSIS — Z800 Family history of malignant neoplasm of digestive organs: Secondary | ICD-10-CM | POA: Insufficient documentation

## 2021-06-04 DIAGNOSIS — Z7982 Long term (current) use of aspirin: Secondary | ICD-10-CM | POA: Insufficient documentation

## 2021-06-04 DIAGNOSIS — Z170 Estrogen receptor positive status [ER+]: Secondary | ICD-10-CM

## 2021-06-04 DIAGNOSIS — Z803 Family history of malignant neoplasm of breast: Secondary | ICD-10-CM | POA: Diagnosis not present

## 2021-06-04 DIAGNOSIS — Z923 Personal history of irradiation: Secondary | ICD-10-CM | POA: Insufficient documentation

## 2021-06-04 DIAGNOSIS — R609 Edema, unspecified: Secondary | ICD-10-CM | POA: Insufficient documentation

## 2021-06-04 DIAGNOSIS — E7800 Pure hypercholesterolemia, unspecified: Secondary | ICD-10-CM | POA: Insufficient documentation

## 2021-06-04 DIAGNOSIS — Z853 Personal history of malignant neoplasm of breast: Secondary | ICD-10-CM | POA: Insufficient documentation

## 2021-06-04 DIAGNOSIS — Z87891 Personal history of nicotine dependence: Secondary | ICD-10-CM | POA: Diagnosis not present

## 2021-06-04 DIAGNOSIS — Z8042 Family history of malignant neoplasm of prostate: Secondary | ICD-10-CM | POA: Diagnosis not present

## 2021-06-04 DIAGNOSIS — I2510 Atherosclerotic heart disease of native coronary artery without angina pectoris: Secondary | ICD-10-CM | POA: Insufficient documentation

## 2021-06-04 DIAGNOSIS — M129 Arthropathy, unspecified: Secondary | ICD-10-CM | POA: Insufficient documentation

## 2021-06-04 DIAGNOSIS — K219 Gastro-esophageal reflux disease without esophagitis: Secondary | ICD-10-CM | POA: Insufficient documentation

## 2021-06-04 DIAGNOSIS — E559 Vitamin D deficiency, unspecified: Secondary | ICD-10-CM | POA: Diagnosis not present

## 2021-06-04 DIAGNOSIS — E785 Hyperlipidemia, unspecified: Secondary | ICD-10-CM | POA: Diagnosis not present

## 2021-06-04 LAB — COMPREHENSIVE METABOLIC PANEL
ALT: 11 U/L (ref 0–44)
AST: 18 U/L (ref 15–41)
Albumin: 3.9 g/dL (ref 3.5–5.0)
Alkaline Phosphatase: 47 U/L (ref 38–126)
Anion gap: 10 (ref 5–15)
BUN: 19 mg/dL (ref 8–23)
CO2: 26 mmol/L (ref 22–32)
Calcium: 10 mg/dL (ref 8.9–10.3)
Chloride: 105 mmol/L (ref 98–111)
Creatinine, Ser: 0.86 mg/dL (ref 0.44–1.00)
GFR, Estimated: >60 mL/min (ref >60)
Glucose, Bld: 145 mg/dL — ABNORMAL HIGH (ref 70–99)
Potassium: 4.2 mmol/L (ref 3.5–5.1)
Sodium: 141 mmol/L (ref 135–145)
Total Bilirubin: 0.3 mg/dL (ref 0.3–1.2)
Total Protein: 7 g/dL (ref 6.5–8.1)

## 2021-06-04 LAB — CBC WITH DIFFERENTIAL/PLATELET
Abs Immature Granulocytes: 0.03 10*3/uL (ref 0.00–0.07)
Basophils Absolute: 0.1 10*3/uL (ref 0.0–0.1)
Basophils Relative: 1 %
Eosinophils Absolute: 0.3 10*3/uL (ref 0.0–0.5)
Eosinophils Relative: 3 %
HCT: 36.9 % (ref 36.0–46.0)
Hemoglobin: 11.8 g/dL — ABNORMAL LOW (ref 12.0–15.0)
Immature Granulocytes: 0 %
Lymphocytes Relative: 19 %
Lymphs Abs: 1.5 10*3/uL (ref 0.7–4.0)
MCH: 27.8 pg (ref 26.0–34.0)
MCHC: 32 g/dL (ref 30.0–36.0)
MCV: 87 fL (ref 80.0–100.0)
Monocytes Absolute: 0.8 10*3/uL (ref 0.1–1.0)
Monocytes Relative: 10 %
Neutro Abs: 5 10*3/uL (ref 1.7–7.7)
Neutrophils Relative %: 67 %
Platelets: 275 10*3/uL (ref 150–400)
RBC: 4.24 MIL/uL (ref 3.87–5.11)
RDW: 15.3 % (ref 11.5–15.5)
WBC: 7.6 10*3/uL (ref 4.0–10.5)
nRBC: 0 % (ref 0.0–0.2)

## 2021-06-04 NOTE — Progress Notes (Signed)
[place]  [phone] [phone]     ID: FADUMO HENG DOB: 1946-01-16  MR#: [id number]  ULA#:[id number]  Patient Care Team: Lawerance Cruel, MD as PCP - General (Family Medicine) Skeet Latch, MD as PCP - Cardiology (Cardiology) Mcarthur Rossetti, MD as Consulting Physician (Orthopedic Surgery) Wilson Sample, Virgie Dad, MD as Consulting Physician (Oncology) Kyung Rudd, MD as Consulting Physician (Radiation Oncology) Huel Cote, NP (Inactive) as Nurse Practitioner (Obstetrics and Gynecology) Clarene Essex, MD as Consulting Physician (Gastroenterology) Coralie Keens, MD as Consulting Physician (General Surgery) OTHER MD:   CHIEF COMPLAINT: Estrogen receptor positive breast cancer  CURRENT TREATMENT: Observation   INTERVAL HISTORY: Jenny Reichmann returns today for follow-up of her estrogen receptor positive breast cancer. She is now under observation alone.  Since her last visit, she underwent bilateral diagnostic mammography with tomography at [facility] on 01/01/2021 showing: breast density category C; no evidence of malignancy in either breast.   REVIEW OF SYSTEMS: Jenny Reichmann is working with Zollie Beckers, who did her left knee previously and eventually will do the right knee.  Her husband according to Jenny Reichmann continues to decline.  He has severe Alzheimer's.  He is under a study drug through [facility] neurology.  He is sleeping more than 12 hours a day and she says if she did not get him out of bed he would stay in bed.  She does say her children help sometimes with lawnmowing and so on but she basically has almost no time for herself in a normal week.  Through Dr. Trevor Mace as she is now riding a stationary bike at least 10 minutes a day and she has physical therapy twice a week.  She is also going to health and wellness and she has already lost 17 pounds.  A detailed review of systems was otherwise stable  COVID 19 VACCINATION  STATUS: [place] x2   HISTORY OF CURRENT ILLNESS: From the original intake note:  "Jenny Reichmann" had routine screening mammography on 10/04/2018 showing a possible abnormality in the right breast. She underwent unilateral right diagnostic mammography with tomography and right breast ultrasonography at [facility] on 10/12/2018 showing: Breast Density Category C. Spot compression tomosynthesis images through the upper outer quadrant of the right breast demonstrates a persistent irregular mass with indistinct margins measuring approximately 1 cm. On physical exam, no definite palpable mass is identified in the upper-outer right breast. Targeted ultrasound is performed, showing an irregular hypoechoic shadowing mass at 10:30 oclock, 6 cm from the nipple measuring 9 x 8 x 9 mm. Ultrasound of the right axilla demonstrates multiple normal-appearing lymph nodes.  Accordingly on 10/18/2018 she proceeded to biopsy of the right breast area in question. The pathology from this procedure showed ([id number]): invasive ductal carcinoma, grade II. Prognostic indicators significant for: estrogen receptor, 100% positive and progesterone receptor, 20% positive, both with strong staining intensity. Proliferation marker Ki67 at 5%. HER2 equivocal (2+) by immunohistochemisty but negative by fluorescent in situ hybridization with a signals ratio 1.26 and number per cell 2.40.   The patient's subsequent history is as detailed below.   PAST MEDICAL HISTORY: Past Medical History:  Diagnosis Date   Anxiety    Arthritis    Back, Hip- right   Back pain    Cancer ([place])    right breast   Cataract    Colitis    Coronary artery calcification 02/20/2020   Diabetes mellitus without complication (HCC)    Type II   Edema 03/05/2021  Edema of both lower extremities    Endometriosis    Family history of adverse reaction to anesthesia    Father - N/V - blood pressure; daughter N/V   Family history of breast cancer     Family history of colon cancer    Family history of kidney cancer    Family history of prostate cancer    Fatty liver    GERD (gastroesophageal reflux disease)    History of kidney stones    History of ulcerative colitis    Hypertension    Insomnia    Joint pain    Kidney problem    Lower extremity edema 03/05/2021   Lumbar disc disease    L4 L5   Neuropathy    Osteoarthritis    Other hyperlipidemia    Palpitations    Personal history of radiation therapy    Pneumonia    1983, 2003   Pure hypercholesterolemia 02/20/2020   PVCs (premature ventricular contractions)    benign   Umbilical hernia    Vitamin D deficiency    Wears glasses     PAST SURGICAL HISTORY: Past Surgical History:  Procedure Laterality Date   APPENDECTOMY     BREAST BIOPSY  08-13-2009  DR TSUEI   EXCISION LEFT NIPPLE DUCT   BREAST EXCISIONAL BIOPSY Left    x2   BREAST EXCISIONAL BIOPSY Right    BREAST LUMPECTOMY Right 11/16/2018   invasive ductal   CHOLECYSTECTOMY  1992   COLON RESECTION  1986   ULCERATIVE COLITIS   COLON SURGERY     ESOPHAGEAL MANOMETRY N/A 06/11/2013   Procedure: ESOPHAGEAL MANOMETRY (EM);  Surgeon: Jeryl Columbia, MD;  Location: [facility];  Service: Endoscopy;  Laterality: N/A;   ESOPHAGOGASTRODUODENOSCOPY (EGD) WITH PROPOFOL N/A 05/31/2016   Procedure: ESOPHAGOGASTRODUODENOSCOPY (EGD) WITH PROPOFOL;  Surgeon: Clarene Essex, MD;  Location: [facility] ENDOSCOPY;  Service: Endoscopy;  Laterality: N/A;   EXCISION RIGHT BREAST MASS   10-20-2005  DR Collier [place] YOUNG   FRACTURE SURGERY     left arm   kidney stone removed  2/11   KNEE ARTHROSCOPY Left    MCL   LEFT THUMB CARPOMETACARPAL JOINT SUSPENSIONPLASTY  04-06-2006  DR Burney Gauze   LEFT WRIST ARTHROSCOPY W/ DEBRIDEMENT AND REMOVAL CYST  03-26-2004  DR Burney Gauze   MYOMECTOMY     RADIOACTIVE SEED GUIDED PARTIAL MASTECTOMY WITH AXILLARY SENTINEL LYMPH NODE BIOPSY Right 11/16/2018   Procedure: RIGHT BREAST RADIOACTIVE SEED GUIDED PARTIAL MASTECTOMY  WITH  SENTINEL  NODE BIOPSY;  Surgeon: Coralie Keens, MD;  Location: [facility];  Service: General;  Laterality: Right;   RIGHT COLECTOMY     RIGHT URETEROSCOPIC STONE EXTRACTION  12-11-2009  DR [place] HIP ARTHROPLASTY Right 11/26/2016   Procedure: RIGHT TOTAL HIP ARTHROPLASTY ANTERIOR APPROACH;  Surgeon: Mcarthur Rossetti, MD;  Location: [facility];  Service: Orthopedics;  Laterality: Right;   TOTAL KNEE ARTHROPLASTY Left 12/02/2017   Procedure: LEFT TOTAL KNEE ARTHROPLASTY;  Surgeon: Mcarthur Rossetti, MD;  Location: [facility];  Service: Orthopedics;  Laterality: Left;   WRIST SURGERY     both    FAMILY HISTORY: Family History  Problem Relation Age of Onset   Hyperlipidemia Mother    Diabetes Mother    Hypertension Mother    Parkinson's disease Mother    Kidney disease Mother    Kidney disease Father    Stroke Father    Heart failure Father  Hypertension Father    Heart disease Father    Diabetes Father    Cancer Father        colon, prostate, and kidney   Heart disease Maternal Grandmother    Diabetes Maternal Grandmother    Heart disease Maternal Grandfather    Diabetes Maternal Grandfather    Cancer Maternal Grandfather        pt unaware of what kind   Stroke Paternal Grandmother    Stroke Paternal Grandfather    Cancer Maternal Aunt        cervical vs ovarian   Colon cancer Paternal Aunt        dx less than 12   Breast cancer Paternal Aunt        dx in her 14s   Prostate cancer Paternal Uncle    Breast cancer Cousin        pat first cousin, dx over 67   Prostate cancer Other        PGF's father  Cindy's father died from colon cancer at age 74; he was diagnosed with prostate cancer at 79, and he also had kidney cancer. Patients' mother died from failure to thrive at age 29. The patient has no siblings. Jenny Reichmann had a paternal aunt that was diagnosed with breast cancer in her mid 2's. Jenny Reichmann also has a paternal great  grandfather that was diagnosed with prostate cancer. Patient denies anyone in her family having ovarian cancer.    GYNECOLOGIC HISTORY:  No LMP recorded. Patient is postmenopausal. 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 Her husband, [name], ran a Building surveyor. Together, they have two children, Richard and [employer]. Their son, Delfino Lovett, lives in [place] and is disabled (MS). Their daughter, [name], is a Physiological scientist (and also has MS). Jenny Reichmann has 5 grandchildren, no great grandchildren. She attends a [employer].    ADVANCED DIRECTIVES: In the absence of any documentation to the contrary, the patient's spouse is their HCPOA.    HEALTH MAINTENANCE: Social History   Tobacco Use   Smoking status: Former    Packs/day: 1.00    Years: 10.00    Pack years: 10.00    Types: Cigarettes    Quit date: 09/22/1982    Years since quitting: 38.7   Smokeless tobacco: Never   Tobacco comments:    quit 1983  Vaping Use   Vaping Use: Never used  Substance Use Topics   Alcohol use: No   Drug use: No    Colonoscopy: yes, 3 or 4 years ago  PAP: Stopped at 19; [facility], [facility]  Bone density: 12/2019, -1.7  Allergies  Allergen Reactions   Ciprofloxacin Hcl Hives   Percocet [Oxycodone-Acetaminophen] Other (See Comments)    RESPIRATORY DISTRESS   Penicillins Hives and Other (See Comments)    Has patient had a PCN reaction causing immediate rash, facial/tongue/throat swelling, SOB or lightheadedness with hypotension: no Has patient had a PCN reaction causing severe rash involving mucus membranes or skin necrosis: no Has patient had a PCN reaction that required hospitalization no Has patient had a PCN reaction occurring within  the last 10 years: no If all of the above answers are "NO", then may proceed with Cephalosporin use.   Sulfa Antibiotics  Hives   Tape Other (See Comments)    Adhesives - Causes Blisters. No band aids, and plastic tape    Tetanus Toxoids Hives and Rash   Jardiance [Empagliflozin]     Current Outpatient Medications  Medication Sig Dispense Refill   amLODipine (NORVASC) 5 MG tablet TAKE 1 TABLET BY MOUTH ONCE DAILY *NEED  OFFICE  VISIT* (Patient taking differently: 2.5 mg.) 90 tablet 3   aspirin EC 81 MG tablet Take 81 mg by mouth daily.     b complex vitamins tablet Take 1 tablet by mouth daily.     balsalazide (COLAZAL) 750 MG capsule Take 2,250 mg by mouth 3 (three) times daily.      Cholecalciferol (VITAMIN D3) 125 MCG (5000 UT) CAPS 1 capsule     cholestyramine (QUESTRAN) 4 GM/DOSE powder Take 4 g by mouth daily.      furosemide (LASIX) 20 MG tablet Take 20 mg by mouth daily.      gabapentin (NEURONTIN) 300 MG capsule Take 600 mg by mouth at bedtime.   1   HYDROcodone-acetaminophen (NORCO) 10-325 MG tablet Take by mouth.     Lancets (ONETOUCH DELICA PLUS [id number]) MISC      lidocaine (LMX) 4 % cream Apply 1 application topically daily as needed (for joint pain).      metFORMIN (GLUCOPHAGE-XR) 500 MG 24 hr tablet Take 1,000 mg by mouth 2 (two) times daily.      methocarbamol (ROBAXIN) 500 MG tablet Take 1 tablet (500 mg total) by mouth every 6 (six) hours as needed for muscle spasms. (Patient taking differently: Take 500 mg by mouth at bedtime.) 60 tablet 0   ONETOUCH VERIO test strip      Oxymetazoline HCl (NASAL SPRAY NA) Place 1-2 sprays into both nostrils at bedtime as needed (for allergies).     pantoprazole (PROTONIX) 40 MG tablet Take 40 mg by mouth 2 (two) times daily.     pioglitazone (ACTOS) 15 MG tablet Take 45 mg by mouth daily.     quinapril (ACCUPRIL) 40 MG tablet Take 40 mg by mouth daily.     rosuvastatin (CRESTOR) 20 MG tablet TAKE 1 TABLET BY MOUTH EVERY DAY 90 tablet  1   Vitamin D, Ergocalciferol, (DRISDOL) 1.25 MG (50000 UNIT) CAPS capsule Take 1 capsule (50,000 Units total) by mouth every 7 (seven) days. 4 capsule 0   No current facility-administered medications for this visit.    OBJECTIVE:  white woman who appears stated age  39:   06/04/21 1100  BP: (!) 144/65  Pulse: 67  Resp: 16  Temp: 97.9 F (36.6 C)  SpO2: 97%      Body mass index is 35.48 kg/m.   Wt Readings from Last 3 Encounters:  06/04/21 206 lb 11.2 oz (93.8 kg)  06/03/21 202 lb (91.6 kg)  05/20/21 210 lb (95.3 kg)      ECOG FS:2 - Symptomatic, <50% confined to bed  Sclerae unicteric, EOMs intact Wearing a mask No cervical or supraclavicular adenopathy Lungs no rales or rhonchi Heart regular rate and rhythm Abd soft, nontender, positive bowel sounds MSK no focal spinal tenderness, no upper extremity lymphedema Neuro: nonfocal, well oriented, appropriate affect Breasts: The right breast has undergone lumpectomy and radiation.  There is no evidence of local recurrence per the left breast and both axillae are benign   LAB RESULTS:  CMP     Component Value Date/Time   NA 141 06/04/2021 1032   NA 142 04/22/2021 1039  K 4.2 06/04/2021 1032   CL 105 06/04/2021 1032   CO2 26 06/04/2021 1032   GLUCOSE 145 (H) 06/04/2021 1032   BUN 19 06/04/2021 1032   BUN 12 04/22/2021 1039   CREATININE 0.86 06/04/2021 1032   CREATININE 0.96 11/09/2018 1405   CALCIUM 10.0 06/04/2021 1032   PROT 7.0 06/04/2021 1032   PROT 7.3 04/22/2021 1039   ALBUMIN 3.9 06/04/2021 1032   ALBUMIN 4.9 (H) 04/22/2021 1039   AST 18 06/04/2021 1032   AST 34 11/09/2018 1405   ALT 11 06/04/2021 1032   ALT 24 11/09/2018 1405   ALKPHOS 47 06/04/2021 1032   BILITOT 0.3 06/04/2021 1032   BILITOT 0.3 04/22/2021 1039   BILITOT 0.4 11/09/2018 1405   GFRNONAA >60 06/04/2021 1032   GFRNONAA 59 (L) 11/09/2018 1405   GFRAA >60 02/04/2020 1212   GFRAA >60 11/09/2018 1405    No results found for:  TOTALPROTELP, ALBUMINELP, A1GS, A2GS, BETS, BETA2SER, GAMS, MSPIKE, SPEI  No results found for: KPAFRELGTCHN, LAMBDASER, KAPLAMBRATIO  Lab Results  Component Value Date   WBC 7.6 06/04/2021   NEUTROABS 5.0 06/04/2021   HGB 11.8 (L) 06/04/2021   HCT 36.9 06/04/2021   MCV 87.0 06/04/2021   PLT 275 06/04/2021   No results found for: LABCA2  No components found for: [id number]  No results for input(s): INR in the last 168 hours.  No results found for: LABCA2  No results found for: WIO973  No results found for: ZHG992  No results found for: EQA834  No results found for: CA2729  No components found for: HGQUANT  No results found for: CEA1 / No results found for: CEA1   No results found for: AFPTUMOR  No results found for: CHROMOGRNA  No results found for: HGBA, HGBA2QUANT, HGBFQUANT, HGBSQUAN (Hemoglobinopathy evaluation)   No results found for: LDH  No results found for: IRON, TIBC, IRONPCTSAT (Iron and TIBC)  No results found for: FERRITIN  Urinalysis    Component Value Date/Time   COLORURINE YELLOW 09/19/2018 [place] 09/19/2018 1143   LABSPEC 1.010 09/19/2018 1143   PHURINE 6.0 09/19/2018 1143   GLUCOSEU 3+ (A) 09/19/2018 1143   HGBUR TRACE (A) 09/19/2018 1143   BILIRUBINUR NEGATIVE 11/05/2015 [place] 09/19/2018 1143   PROTEINUR NEGATIVE 09/19/2018 1143   UROBILINOGEN 0.2 11/28/2013 1456   NITRITE NEGATIVE 11/05/2015 1512   LEUKOCYTESUR 1+ (A) 11/05/2015 1512    STUDIES:  No results found.   ELIGIBLE FOR AVAILABLE RESEARCH PROTOCOL: no   ASSESSMENT: 75 y.o. [place], [place] woman status post right breast upper outer quadrant biopsy 10/18/2018 for a clinical T1b N0, stage IA invasive ductal carcinoma, grade 2, estrogen and progesterone receptor positive, MIB-1 5%, with no HER-2 amplification by FISH  (1) right lumpectomy and sentinel lymph node sampling 11/16/2018 showed a pT1c pN0, stage IA invasive ductal  carcinoma, grade 2, with negative margins  (a) a single sentinel lymph node was removed  (2) Oncotype score of 12 predicted a risk of recurrence outside the breast over the next 9 years of only 3% if the patient's only systemic treatment was antiestrogens for 5 years.  No significant benefit was anticipated from chemotherapy.  (a) Oncotype also read tumor as HER-2 negative  (3) adjuvant radiation 01/02/2019 to 01/29/2019. Site/dose:   The patient initially received a dose of 42.56 Gy in 16 fractions to the right breast using whole-breast tangent fields. This was delivered using a 3-D conformal technique. The patient then  received a boost. This delivered an additional 8 Gy in 4 fractions using a 3 field photon technique. The total dose was 50.56 Gy.  (4) anastrozole started 02/13/2019  (a) bone density 01/06/2012 was normal  (b) anastrozole discontinued April 2021, with arthralgias/myalgias  (5) genetics testing 11/27/2018 through the Multi-Gene Panel offered by [facility] found no deleterious mutations in AIP, ALK, APC, ATM, AXIN2,BAP1,  BARD1, BLM, BMPR1A, BRCA1, BRCA2, BRIP1, CASR, CDC73, CDH1, CDK4, CDKN1B, CDKN1C, CDKN2A (p14ARF), CDKN2A (p16INK4a), CEBPA, CHEK2, CTNNA1, DICER1, DIS3L2, EGFR (c.2369C>T, p.Thr790Met variant only), EPCAM (Deletion/duplication testing only), FH, FLCN, GATA2, GPC3, GREM1 (Promoter region deletion/duplication testing only), HOXB13 (c.251G>A, p.Gly84Glu), HRAS, KIT, MAX, MEN1, MET, MITF (c.952G>A, p.Glu318Lys variant only), MLH1, MSH2, MSH3, MSH6, MUTYH, NBN, NF1, NF2, NTHL1, PALB2, PDGFRA, PHOX2B, PMS2, POLD1, POLE, POT1, PRKAR1A, PTCH1, PTEN, RAD50, RAD51C, RAD51D, RB1, RECQL4, RET, RUNX1, SDHAF2, SDHA (sequence changes only), SDHB, SDHC, SDHD, SMAD4, SMARCA4, SMARCB1, SMARCE1, STK11, SUFU, TERC, TERT, TMEM127, TP53, TSC1, TSC2, VHL, WRN and WT1.    (a) a variant of uncertain significant was found in [place], c.2118C>T (silent)  (6) exemestane prescribed  04/03/2020--could not obtain secondary to cost  (a) started letrozole Jun 2021, discontinued because of intolerable side effects  (b) opted against tamoxifen (see discussion 08/05/2020 visit)   PLAN: Jenny Reichmann is a little over 2-1/2 years out from definitive surgery for her breast cancer with no evidence of disease recurrence.  This is very favorable.  She was unable to tolerate aromatase inhibitors and is not a good candidate for tamoxifen so we are following her with observation alone.  Her home situation is very difficult.  She did read "the 36-hour day" and learned quite a bit from that.  I think her husband will continue to decline and in fact I suspect he is already hospice appropriate on the basis of his Alzheimer's.  If his condition deteriorates further she can discuss that of course with his primary care physician  Otherwise she will have mammography again neck spring and see is in 1 year  She knows to call for any other issue that may develop before then.  Total encounter time 25 minutes.Sarajane Jews C. Josue Kass, MD  Medical Oncology and Hematology [facility] [place], [place] [address] Tel. [phone]    Fax. [phone]    I, Wilburn Mylar, am acting as scribe for Dr. Virgie Dad. Grizelda Piscopo.  I, Lurline Del MD, have reviewed the above documentation for accuracy and completeness, and I agree with the above.   *Total Encounter Time as defined by the Centers for Medicare and Medicaid Services includes, in addition to the face-to-face time of a patient visit (documented in the note above) non-face-to-face time: obtaining and reviewing outside history, ordering and reviewing medications, tests or procedures, care coordination (communications with other health care professionals or caregivers) and documentation in the medical record.

## 2021-06-04 NOTE — Progress Notes (Signed)
Chief Complaint:   OBESITY Lauren [name] is here to discuss her progress with her obesity treatment plan along with follow-up of her obesity related diagnoses. Lauren [name] is on the Category 2 Plan and keeping a food journal and adhering to recommended goals of 350-500 calories and 30 grams protein at lunch and states she is following her eating plan approximately 80% of the time. Lauren [name] states she is doing physical therapy 60 minutes 2 times per week.  Today's visit was #: 4 Starting weight: 219 lbs Starting date: 04/22/2021 Today's weight: 202 lbs Today's date: 06/03/2021 Total lbs lost to date: 17 Total lbs lost since last in-office visit: 8  Interim History: Lauren [name] has an wedding anniversary coming up and will be eating at [employer]. She and her husband eat out about 4 times per week. She makes healthy choices. Her husband has Alzheimer's. He is sleeping quite a bit.   Subjective:   1. Other depression with emotional eating Lauren [name] did not start the bupropion prescribed at last OV. She says she does not feel depressed.  2. Vitamin D deficiency Lauren [name] is on prescription and [name] (5,000 IU). Her Vit D is low at 40.   Lab Results  Component Value Date   VD25OH 40.7 04/22/2021   Assessment/Plan:   1. Other depression with emotional eating Explained that we use bupropion for emotional eating as well as depression.  Lauren [name] does not want to start the bupropion at this time. She will consider if needed in the future.  2. Vitamin D deficiency She agrees to continue to take prescription Vitamin D 50,000 IU every week  and [name] 5,000 IU QD. She will follow-up for routine testing of Vitamin D, at least 2-3 times per year to avoid over-replacement.  Refill- Vitamin D, Ergocalciferol, (DRISDOL) 1.25 MG (50000 UNIT) CAPS capsule; Take 1 capsule (50,000 Units total) by mouth every 7 (seven) days.  Dispense: 4 capsule; Refill: 0  3. Obesity with current BMI 34.66  Lauren [name] is  currently in the action stage of change. As such, her goal is to continue with weight loss efforts. She has agreed to the Category 2 Plan with breakfast options.   Pt asked for journaling numbers for all meals and this was provided. She will have an off plan meal at [name] for her anniversary and then get back on plan right away.   Exercise goals:  As is  Behavioral modification strategies: increasing lean protein intake, decreasing simple carbohydrates, meal planning and cooking strategies, and celebration eating strategies.  Lauren [name] has agreed to follow-up with our clinic in 3 weeks with Dr. Leafy Ro.   Objective:   Blood pressure 118/71, pulse 81, temperature 98 F (36.7 C), height '5\' 4"'$  (1.626 m), weight 202 lb (91.6 kg), SpO2 95 %. Body mass index is 34.67 kg/m.  General: Cooperative, alert, well developed, in no acute distress. HEENT: Conjunctivae and lids unremarkable. Cardiovascular: Regular rhythm.  Lungs: Normal work of breathing. Neurologic: No focal deficits.   Lab Results  Component Value Date   CREATININE 0.75 04/22/2021   BUN 12 04/22/2021   NA 142 04/22/2021   K 4.4 04/22/2021   CL 102 04/22/2021   CO2 22 04/22/2021   Lab Results  Component Value Date   ALT 10 04/22/2021   AST 19 04/22/2021   ALKPHOS 60 04/22/2021   BILITOT 0.3 04/22/2021   Lab Results  Component Value Date   HGBA1C 6.7 (H) 04/22/2021   HGBA1C 8.9 (H)  11/13/2018   HGBA1C 7.7 (H) 11/25/2017   HGBA1C 7.2 (H) 08/24/2017   HGBA1C 6.7 (H) 11/16/2016   Lab Results  Component Value Date   INSULIN 22.3 04/22/2021   Lab Results  Component Value Date   TSH 1.680 04/22/2021   Lab Results  Component Value Date   CHOL 129 04/22/2021   HDL 56 04/22/2021   LDLCALC 51 04/22/2021   TRIG 125 04/22/2021   Lab Results  Component Value Date   VD25OH 40.7 04/22/2021   Lab Results  Component Value Date   WBC 7.0 04/22/2021   HGB 12.5 04/22/2021   HCT 38.8 04/22/2021   MCV 86  04/22/2021   PLT 258 08/05/2020   No results found for: IRON, TIBC, FERRITIN  Obesity Behavioral Intervention:   Approximately 15 minutes were spent on the discussion below.  ASK: We discussed the diagnosis of obesity with Lauren [name] today and Lauren [name] agreed to give [place] permission to discuss obesity behavioral modification therapy today.  ASSESS: Lauren [name] has the diagnosis of obesity and her BMI today is 34.7. Lauren [name] is in the action stage of change.   ADVISE: Lauren [name] was educated on the multiple health risks of obesity as well as the benefit of weight loss to improve her health. She was advised of the need for long term treatment and the importance of lifestyle modifications to improve her current health and to decrease her risk of future health problems.  AGREE: Multiple dietary modification options and treatment options were discussed and Lauren [name] agreed to follow the recommendations documented in the above note.  ARRANGE: Lauren [name] was educated on the importance of frequent visits to treat obesity as outlined per CMS and USPSTF guidelines and agreed to schedule her next follow up appointment today.  Attestation Statements:   Reviewed by clinician on day of visit: allergies, medications, problem list, medical history, surgical history, family history, social history, and previous encounter notes.  Coral Ceo, CMA, am acting as Location manager for [employer], Lauren [name].  I have reviewed the above documentation for accuracy and completeness, and I agree with the above. -  Lauren Fick, FNP

## 2021-06-08 ENCOUNTER — Telehealth: Payer: Self-pay | Admitting: Hematology and Oncology

## 2021-06-08 NOTE — Telephone Encounter (Signed)
Scheduled appts per 8/4 los. Pt aware.

## 2021-06-09 DIAGNOSIS — M6281 Muscle weakness (generalized): Secondary | ICD-10-CM | POA: Diagnosis not present

## 2021-06-09 DIAGNOSIS — R2681 Unsteadiness on feet: Secondary | ICD-10-CM | POA: Diagnosis not present

## 2021-06-09 DIAGNOSIS — M25561 Pain in right knee: Secondary | ICD-10-CM | POA: Diagnosis not present

## 2021-06-09 DIAGNOSIS — M7631 Iliotibial band syndrome, right leg: Secondary | ICD-10-CM | POA: Diagnosis not present

## 2021-06-12 DIAGNOSIS — R2681 Unsteadiness on feet: Secondary | ICD-10-CM | POA: Diagnosis not present

## 2021-06-12 DIAGNOSIS — M6281 Muscle weakness (generalized): Secondary | ICD-10-CM | POA: Diagnosis not present

## 2021-06-12 DIAGNOSIS — M25561 Pain in right knee: Secondary | ICD-10-CM | POA: Diagnosis not present

## 2021-06-12 DIAGNOSIS — M7631 Iliotibial band syndrome, right leg: Secondary | ICD-10-CM | POA: Diagnosis not present

## 2021-06-17 DIAGNOSIS — K219 Gastro-esophageal reflux disease without esophagitis: Secondary | ICD-10-CM | POA: Diagnosis not present

## 2021-06-17 DIAGNOSIS — E7800 Pure hypercholesterolemia, unspecified: Secondary | ICD-10-CM | POA: Diagnosis not present

## 2021-06-17 DIAGNOSIS — E1169 Type 2 diabetes mellitus with other specified complication: Secondary | ICD-10-CM | POA: Diagnosis not present

## 2021-06-17 DIAGNOSIS — I10 Essential (primary) hypertension: Secondary | ICD-10-CM | POA: Diagnosis not present

## 2021-06-22 ENCOUNTER — Encounter (INDEPENDENT_AMBULATORY_CARE_PROVIDER_SITE_OTHER): Payer: Self-pay | Admitting: Family Medicine

## 2021-06-22 ENCOUNTER — Ambulatory Visit (INDEPENDENT_AMBULATORY_CARE_PROVIDER_SITE_OTHER): Payer: PPO | Admitting: Family Medicine

## 2021-06-22 ENCOUNTER — Other Ambulatory Visit: Payer: Self-pay

## 2021-06-22 VITALS — BP 140/82 | HR 58 | Temp 97.5°F | Ht 64.0 in | Wt 201.0 lb

## 2021-06-22 DIAGNOSIS — Z6837 Body mass index (BMI) 37.0-37.9, adult: Secondary | ICD-10-CM | POA: Diagnosis not present

## 2021-06-22 DIAGNOSIS — I10 Essential (primary) hypertension: Secondary | ICD-10-CM

## 2021-06-22 DIAGNOSIS — E559 Vitamin D deficiency, unspecified: Secondary | ICD-10-CM

## 2021-06-22 MED ORDER — VITAMIN D (ERGOCALCIFEROL) 1.25 MG (50000 UNIT) PO CAPS: 50000.0000 [IU] | ORAL_CAPSULE | ORAL | 0 refills | Status: DC

## 2021-06-22 NOTE — Progress Notes (Signed)
Chief Complaint:   OBESITY Lauren [name] is here to discuss her progress with her obesity treatment plan along with follow-up of her obesity related diagnoses. Lauren [name] is on the Category 2 Plan with breakfast options and states she is following her eating plan approximately 70% of the time. Lauren [name] states she is doing physical therapy for 60 minutes 2 times per week.  Today's visit was #: 5 Starting weight: 219 lbs Starting date: 04/22/2021 Today's weight: 201 lbs Today's date: 06/22/2021 Total lbs lost to date: 18 Total lbs lost since last in-office visit: 1  Interim History: Lauren [name] continues to do well with weight loss even despite extra challenges and temptations. She is still mindful of her food choices.  Subjective:   1. Vitamin D deficiency Lauren [name] is stable on Vit D, and she denies signs of over-replacement.  2. Essential hypertension Lauren [name]'s blood pressure is borderline elevated today. She is normally well controlled and she is doing well with diet and weight loss.  Assessment/Plan:   1. Vitamin D deficiency Low Vitamin D level contributes to fatigue and are associated with obesity, breast, and colon cancer. We will refill prescription Vitamin D for 1 month. Lauren [name] will follow-up for routine testing of Vitamin D, at least 2-3 times per year to avoid over-replacement.  - Vitamin D, Ergocalciferol, (DRISDOL) 1.25 MG (50000 UNIT) CAPS capsule; Take 1 capsule (50,000 Units total) by mouth every 7 (seven) days.  Dispense: 4 capsule; Refill: 0  2. Essential hypertension Lauren [name] wll continue diet and exercise, ans we will recheck her blood pressure in 2-3 weeks. She will watch for signs of hypotension as she continues her lifestyle modifications.  3. Obesity with current BMI 34.5 Lauren [name] is currently in the action stage of change. As such, her goal is to continue with weight loss efforts. She has agreed to the Category 2 Plan or keeping a food journal and adhering to  recommended goals of 1200 calories and 70+ grams of protein daily.   Exercise goals: As is.  Behavioral modification strategies: increasing lean protein intake.  Lauren [name] has agreed to follow-up with our clinic in 2 to 3 weeks. She was informed of the importance of frequent follow-up visits to maximize her success with intensive lifestyle modifications for her multiple health conditions.   Objective:   Blood pressure 140/82, pulse (!) 58, temperature (!) 97.5 F (36.4 C), height '5\' 4"'$  (1.626 m), weight 201 lb (91.2 kg), SpO2 98 %. Body mass index is 34.5 kg/m.  General: Cooperative, alert, well developed, in no acute distress. HEENT: Conjunctivae and lids unremarkable. Cardiovascular: Regular rhythm.  Lungs: Normal work of breathing. Neurologic: No focal deficits.   Lab Results  Component Value Date   CREATININE 0.86 06/04/2021   BUN 19 06/04/2021   NA 141 06/04/2021   K 4.2 06/04/2021   CL 105 06/04/2021   CO2 26 06/04/2021   Lab Results  Component Value Date   ALT 11 06/04/2021   AST 18 06/04/2021   ALKPHOS 47 06/04/2021   BILITOT 0.3 06/04/2021   Lab Results  Component Value Date   HGBA1C 6.7 (H) 04/22/2021   HGBA1C 8.9 (H) 11/13/2018   HGBA1C 7.7 (H) 11/25/2017   HGBA1C 7.2 (H) 08/24/2017   HGBA1C 6.7 (H) 11/16/2016   Lab Results  Component Value Date   INSULIN 22.3 04/22/2021   Lab Results  Component Value Date   TSH 1.680 04/22/2021   Lab Results  Component Value Date   CHOL 129 04/22/2021   HDL  56 04/22/2021   LDLCALC 51 04/22/2021   TRIG 125 04/22/2021   Lab Results  Component Value Date   VD25OH 40.7 04/22/2021   Lab Results  Component Value Date   WBC 7.6 06/04/2021   HGB 11.8 (L) 06/04/2021   HCT 36.9 06/04/2021   MCV 87.0 06/04/2021   PLT 275 06/04/2021   No results found for: IRON, TIBC, FERRITIN  Obesity Behavioral Intervention:   Approximately 15 minutes were spent on the discussion below.  ASK: We discussed the diagnosis of  obesity with Lauren [name] today and Lauren [name] agreed to give [place] permission to discuss obesity behavioral modification therapy today.  ASSESS: Lauren [name] has the diagnosis of obesity and her BMI today is 34.5. Lauren [name] is in the action stage of change.   ADVISE: Lauren [name] was educated on the multiple health risks of obesity as well as the benefit of weight loss to improve her health. She was advised of the need for long term treatment and the importance of lifestyle modifications to improve her current health and to decrease her risk of future health problems.  AGREE: Multiple dietary modification options and treatment options were discussed and Lauren [name] agreed to follow the recommendations documented in the above note.  ARRANGE: Lauren [name] was educated on the importance of frequent visits to treat obesity as outlined per CMS and USPSTF guidelines and agreed to schedule her next follow up appointment today.  Attestation Statements:   Reviewed by clinician on day of visit: allergies, medications, problem list, medical history, surgical history, family history, social history, and previous encounter notes.   I, Trixie [name], am acting as transcriptionist for Dennard Nip, MD.  I have reviewed the above documentation for accuracy and completeness, and I agree with the above. -  Dennard Nip, MD

## 2021-06-23 DIAGNOSIS — M7631 Iliotibial band syndrome, right leg: Secondary | ICD-10-CM | POA: Diagnosis not present

## 2021-06-23 DIAGNOSIS — R2681 Unsteadiness on feet: Secondary | ICD-10-CM | POA: Diagnosis not present

## 2021-06-23 DIAGNOSIS — M6281 Muscle weakness (generalized): Secondary | ICD-10-CM | POA: Diagnosis not present

## 2021-06-23 DIAGNOSIS — M25561 Pain in right knee: Secondary | ICD-10-CM | POA: Diagnosis not present

## 2021-06-25 ENCOUNTER — Ambulatory Visit (HOSPITAL_BASED_OUTPATIENT_CLINIC_OR_DEPARTMENT_OTHER): Payer: PPO | Admitting: Cardiovascular Disease

## 2021-06-25 ENCOUNTER — Other Ambulatory Visit: Payer: Self-pay

## 2021-06-25 ENCOUNTER — Encounter (HOSPITAL_BASED_OUTPATIENT_CLINIC_OR_DEPARTMENT_OTHER): Payer: Self-pay | Admitting: Cardiovascular Disease

## 2021-06-25 DIAGNOSIS — R0789 Other chest pain: Secondary | ICD-10-CM

## 2021-06-25 DIAGNOSIS — Z6837 Body mass index (BMI) 37.0-37.9, adult: Secondary | ICD-10-CM

## 2021-06-25 DIAGNOSIS — I10 Essential (primary) hypertension: Secondary | ICD-10-CM | POA: Diagnosis not present

## 2021-06-25 DIAGNOSIS — E7800 Pure hypercholesterolemia, unspecified: Secondary | ICD-10-CM

## 2021-06-25 HISTORY — DX: Other chest pain: R07.89

## 2021-06-25 MED ORDER — CHLORTHALIDONE 25 MG PO TABS: 25.0000 mg | ORAL_TABLET | Freq: Every day | ORAL | 3 refills | Status: DC

## 2021-06-25 MED ORDER — METOPROLOL TARTRATE 50 MG PO TABS: ORAL_TABLET | ORAL | 0 refills | Status: DC

## 2021-06-25 NOTE — Progress Notes (Signed)
Cardiology Office Note   Evaluation Performed:  Follow-up visit  Date:  06/25/2021   ID:  Lauren [name], DOB [birth year], MRN [id number]  PCP:  Lawerance Cruel, MD  Cardiologist:  Skeet Latch, MD  Electrophysiologist:  None   Chief Complaint:  Follow up  History of Present Illness:    Lauren [name] is a 75 y.o. female with asymptomatic coronary calcification, diabetes, hypertension, hyperlipidemia, R breast cancer s/p surgery/XRT/chemo and family history of CAD who presents for follow up.    She was initially seen 09/2015 for an evaluation of chest pain.  She had been seen in the emergency department  where cardiac enzymes were negative.  D-dimer was elevated, so she underwent CT-A of the chest that was negative for PE. She had a [employer] 11/2016 that revealed LVEF 72% and no ischemia.  Amlodipine was added to her regimen and her blood pressure has been much better controlled since that time.  Ms. Ashcroft reported chest discomfort.  She was referred for [facility] 09/2018 that revealed LVEF 77% and no ischemia. She was diagnosed with breast cancer.  She had a routine mammogram in December and was found to have localized disease.  She underwent lumpectomy followed by radiation.    At her last appointment she was struggling with her right IT band and stiffness, but was otherwise doing well. She has been working with [facility] [employer] [facility] and has lost 18 pounds. Today, she is feeling good overall. Since her last visit, her PCP reduced pioglitazone and cut her amlodipine dose in half. She believes she has noticed an improvement in her LE edema, but is unsure of which medicine may have been the cause. At home her blood pressure has typically ranged from 0000000 systolic. About 6-8 weeks ago, she had fallen asleep on her couch. As she woke up and started to rise, she had a "very hard" pain in the center of her back, which then radiated to each side. The duration  was short. The following week this recurred twice, in similar settings. She endorses arthritis in her back. Also, she felt "jittery," but this may have been due to her fear of the episodes. For her diet, she has some difficulty with eating 70 g of protein, but regularly stays below 1200 calories. She continues to enjoy [facility]. She is participating in physical therapy twice a week, and usually uses an exercise bike for 10 minutes. There are no exertional symptoms while using the bike. 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 He has Alzheimer's that seems to be worsening. She denies any palpitations, or shortness of breath. No lightheadedness, headaches, syncope, orthopnea, or PND.   Past Medical History:  Diagnosis Date   Anxiety    Arthritis    Back, Hip- right   Atypical chest pain 06/25/2021   Back pain    Cancer ([place])    right breast   Cataract    Colitis    Coronary artery calcification 02/20/2020   Diabetes mellitus without complication (HCC)    Type II   Edema 03/05/2021   Edema of both lower extremities    Endometriosis    Family history of adverse reaction to anesthesia    Father - N/V - blood pressure; daughter N/V   Family history of breast cancer    Family history of colon cancer    Family history of kidney cancer    Family history of prostate  cancer    Fatty liver    GERD (gastroesophageal reflux disease)    History of kidney stones    History of ulcerative colitis    Hypertension    Insomnia    Joint pain    Kidney problem    Lower extremity edema 03/05/2021   Lumbar disc disease    L4 L5   Neuropathy    Osteoarthritis    Other hyperlipidemia    Palpitations    Personal history of radiation therapy    Pneumonia    1983, 2003   Pure hypercholesterolemia 02/20/2020   PVCs (premature ventricular contractions)    benign   Umbilical hernia    Vitamin D deficiency    Wears glasses    Past Surgical History:   Procedure Laterality Date   APPENDECTOMY     BREAST BIOPSY  08-13-2009  DR TSUEI   EXCISION LEFT NIPPLE DUCT   BREAST EXCISIONAL BIOPSY Left    x2   BREAST EXCISIONAL BIOPSY Right    BREAST LUMPECTOMY Right 11/16/2018   invasive ductal   CHOLECYSTECTOMY  1992   COLON RESECTION  1986   ULCERATIVE COLITIS   COLON SURGERY     ESOPHAGEAL MANOMETRY N/A 06/11/2013   Procedure: ESOPHAGEAL MANOMETRY (EM);  Surgeon: Jeryl Columbia, MD;  Location: [facility];  Service: Endoscopy;  Laterality: N/A;   ESOPHAGOGASTRODUODENOSCOPY (EGD) WITH PROPOFOL N/A 05/31/2016   Procedure: ESOPHAGOGASTRODUODENOSCOPY (EGD) WITH PROPOFOL;  Surgeon: Clarene Essex, MD;  Location: [facility];  Service: Endoscopy;  Laterality: N/A;   EXCISION RIGHT BREAST MASS   10-20-2005  DR Collier [place] YOUNG   FRACTURE SURGERY     left arm   kidney stone removed  2/11   KNEE ARTHROSCOPY Left    MCL   LEFT THUMB CARPOMETACARPAL JOINT SUSPENSIONPLASTY  04-06-2006  DR Burney Gauze   LEFT WRIST ARTHROSCOPY W/ DEBRIDEMENT AND REMOVAL CYST  03-26-2004  DR Burney Gauze   MYOMECTOMY     RADIOACTIVE SEED GUIDED PARTIAL MASTECTOMY WITH AXILLARY SENTINEL LYMPH NODE BIOPSY Right 11/16/2018   Procedure: RIGHT BREAST RADIOACTIVE SEED GUIDED PARTIAL MASTECTOMY WITH  SENTINEL  NODE BIOPSY;  Surgeon: Coralie Keens, MD;  Location: [facility];  Service: General;  Laterality: Right;   RIGHT COLECTOMY     RIGHT URETEROSCOPIC STONE EXTRACTION  12-11-2009  DR [place] HIP ARTHROPLASTY Right 11/26/2016   Procedure: RIGHT TOTAL HIP ARTHROPLASTY ANTERIOR APPROACH;  Surgeon: Mcarthur Rossetti, MD;  Location: [facility];  Service: Orthopedics;  Laterality: Right;   TOTAL KNEE ARTHROPLASTY Left 12/02/2017   Procedure: LEFT TOTAL KNEE ARTHROPLASTY;  Surgeon: Mcarthur Rossetti, MD;  Location: [facility];  Service: Orthopedics;  Laterality: Left;   WRIST SURGERY     both     Current Meds  Medication Sig   aspirin EC 81 MG  tablet Take 81 mg by mouth daily.   b complex vitamins tablet Take 1 tablet by mouth daily.   balsalazide (COLAZAL) 750 MG capsule Take 2,250 mg by mouth 3 (three) times daily.    Cholecalciferol (VITAMIN D3) 125 MCG (5000 UT) CAPS 1 capsule   cholestyramine (QUESTRAN) 4 GM/DOSE powder Take 4 g by mouth daily.    gabapentin (NEURONTIN) 300 MG capsule Take 600 mg by mouth at bedtime.    HYDROcodone-acetaminophen (NORCO) 10-325 MG tablet Take by mouth.   Lancets (ONETOUCH DELICA PLUS [id number]) MISC    lidocaine (LMX) 4 % cream Apply 1  application topically daily as needed (for joint pain).    metFORMIN (GLUCOPHAGE-XR) 500 MG 24 hr tablet Take 1,000 mg by mouth 2 (two) times daily.    methocarbamol (ROBAXIN) 500 MG tablet Take 1 tablet (500 mg total) by mouth every 6 (six) hours as needed for muscle spasms. (Patient taking differently: Take 500 mg by mouth at bedtime.)   ONETOUCH VERIO test strip    Oxymetazoline HCl (NASAL SPRAY NA) Place 1-2 sprays into both nostrils at bedtime as needed (for allergies).   pantoprazole (PROTONIX) 40 MG tablet Take 40 mg by mouth 2 (two) times daily.   pioglitazone (ACTOS) 15 MG tablet Take 45 mg by mouth daily.   quinapril (ACCUPRIL) 40 MG tablet Take 40 mg by mouth daily.   rosuvastatin (CRESTOR) 20 MG tablet TAKE 1 TABLET BY MOUTH EVERY DAY   Vitamin D, Ergocalciferol, (DRISDOL) 1.25 MG (50000 UNIT) CAPS capsule Take 1 capsule (50,000 Units total) by mouth every 7 (seven) days.   [DISCONTINUED] amLODipine (NORVASC) 5 MG tablet TAKE 1 TABLET BY MOUTH ONCE DAILY *NEED  OFFICE  VISIT* (Patient taking differently: 2.5 mg.)   [DISCONTINUED] chlorthalidone (HYGROTON) 25 MG tablet Take 1 tablet (25 mg total) by mouth daily.   [DISCONTINUED] furosemide (LASIX) 20 MG tablet Take 20 mg by mouth daily.    [DISCONTINUED] metoprolol tartrate (LOPRESSOR) 50 MG tablet TAKE 1 TABLET 2 HOURS PRIOR TO CT     Allergies:   Ciprofloxacin hcl, Percocet [oxycodone-acetaminophen],  Penicillins, Sulfa antibiotics, Tape, Tetanus toxoids, and Jardiance [empagliflozin]   Social History   Tobacco Use   Smoking status: Former    Packs/day: 1.00    Years: 10.00    Pack years: 10.00    Types: Cigarettes    Quit date: 09/22/1982    Years since quitting: 38.7   Smokeless tobacco: Never   Tobacco comments:    quit 1983  Vaping Use   Vaping Use: Never used  Substance Use Topics   Alcohol use: No   Drug use: No     Family Hx: The patient's family history includes Breast cancer in her cousin and paternal aunt; Cancer in her father, maternal aunt, and maternal grandfather; Colon cancer in her paternal aunt; Diabetes in her father, maternal grandfather, maternal grandmother, and mother; Heart disease in her father, maternal grandfather, and maternal grandmother; Heart failure in her father; Hyperlipidemia in her mother; Hypertension in her father and mother; Kidney disease in her father and mother; Parkinson's disease in her mother; Prostate cancer in her paternal uncle and another family member; Stroke in her father, paternal grandfather, and paternal grandmother.  ROS:   Please see the history of present illness.    (+) Bilateral LE edema (+) Central chest pain, radiating to her side bilaterally (+) Stress All other systems reviewed and are negative.   Prior CV studies:   The following studies were reviewed today:  Echo 03/31/2021:  1. Left ventricular ejection fraction, by estimation, is 60 to 65%. The  left ventricle has normal function. The left ventricle has no regional  wall motion abnormalities. There is mild left ventricular hypertrophy.  Left ventricular diastolic parameters  are consistent with Grade I diastolic dysfunction (impaired relaxation).  Elevated left ventricular end-diastolic pressure.   2. Right ventricular systolic function is hyperdynamic. The right  ventricular size is normal.   3. The mitral valve is grossly normal. Trivial mitral valve   regurgitation.   4. The aortic valve is tricuspid. Aortic valve regurgitation is not  visualized.   Comparison(s): No prior Echocardiogram.   Lexiscan Myoview 09/18/18: The left ventricular ejection fraction is hyperdynamic (>65%). Nuclear stress EF: 77%. There was no ST segment deviation noted during stress. The study is normal. This is a low risk study.   Normal pharmacologic nuclear stress test with no evidence for prior infarct or ischemia.    Lexiscan Myoview 11/19/16: Nuclear stress EF: 72%. The left ventricular ejection fraction is hyperdynamic (>65%). There was no ST segment deviation noted during stress. This is a low risk study. No perfusion defects.    Labs/Other Tests and Data Reviewed:    EKG:  06/25/2021: Sinus rhythm. Rate 66 bpm. 03/05/2021: Sinus rhythm.  Rate 66 bpm. 02/18/2020: Sinus rhythm. Rate 97 bpm. Left axis deviation. Low voltage.  Recent Labs: 04/22/2021: TSH 1.680 06/04/2021: ALT 11; BUN 19; Creatinine, Ser 0.86; Hemoglobin 11.8; Platelets 275; Potassium 4.2; Sodium 141   Recent Lipid Panel Lab Results  Component Value Date/Time   CHOL 129 04/22/2021 10:39 AM   TRIG 125 04/22/2021 10:39 AM   HDL 56 04/22/2021 10:39 AM   LDLCALC 51 04/22/2021 10:39 AM     09/17/2019: Total cholesterol 119, triglycerides 132, HDL 49, LDL 43  Wt Readings from Last 3 Encounters:  06/25/21 205 lb (93 kg)  06/22/21 201 lb (91.2 kg)  06/04/21 206 lb 11.2 oz (93.8 kg)     Objective:   VS:  BP 130/64   Pulse 66   Ht '5\' 4"'$  (1.626 m)   Wt 205 lb (93 kg)   BMI 35.19 kg/m  , BMI Body mass index is 35.19 kg/m. GENERAL:  Well appearing HEENT: Pupils equal round and reactive, fundi not visualized, oral mucosa unremarkable NECK: No JVD.  Waveform within normal limits, carotid upstroke brisk and symmetric, no bruits LUNGS:  Clear to auscultation bilaterally HEART:  RRR.  PMI not displaced or sustained,S1 and S2 within normal limits, no S3, no S4, no clicks, no rubs,  no murmurs ABD:  Flat, positive bowel sounds normal in frequency in pitch, no bruits, no rebound, no guarding, no midline pulsatile mass, no hepatomegaly, no splenomegaly EXT:  2 plus pulses throughout, No edema, no cyanosis no clubbing SKIN:  No rashes no nodules NEURO:  Cranial nerves II through XII grossly intact, motor grossly intact throughout PSYCH:  Cognitively intact, oriented to person place and time  ASSESSMENT & PLAN:   Essential hypertension BP has been stable to mildly elevated but she still struggles with some edema.  She wants to stop amlodipine.  We will stop lasix and start chlorthalidone '25mg'$  daily.  If her BP is elevated but edema improves we can add back amlodipine 2.'5mg'$  or consider an alternative agent.  Continue ramipril.  Check a BMP in a week.  Track BP.  Goal <130/80.  Pure hypercholesterolemia Lipids are well-controlled on rosuvastatin.  LDL was 34 on 09/2020.  Atypical chest pain Ms. Baar had a couple episodes of severe back pain that radiated around to her chest when walking.  She does obtain an exercise bike.  She does have known coronary calcifications.  We will get a coronary CTA to evaluate for obstructive coronary disease.  Continue aspirin, amlodipine, and rosuvastatin.  Class 2 severe obesity with serious comorbidity and body mass index (BMI) of 37.0 to 37.9 in adult [facility]) Ms. Silvernale was congratulated on her weight loss.  She has done a great job with the [facility] wellness clinic.    Medication Adjustments/Labs and Tests Ordered: Current medicines are  reviewed at length with the patient today.  Concerns regarding medicines are outlined above.   Tests Ordered: Orders Placed This Encounter  Procedures   CT CORONARY MORPH W/CTA COR W/SCORE W/CA W/CM &/OR WO/CM   Basic metabolic panel   EKG [id number]     Medication Changes: Meds ordered this encounter  Medications   DISCONTD: chlorthalidone (HYGROTON) 25 MG tablet    Sig: Take 1 tablet  (25 mg total) by mouth daily.    Dispense:  90 tablet    Refill:  3    D/C FUROSEMIDE   DISCONTD: metoprolol tartrate (LOPRESSOR) 50 MG tablet    Sig: TAKE 1 TABLET 2 HOURS PRIOR TO CT    Dispense:  1 tablet    Refill:  0   chlorthalidone (HYGROTON) 25 MG tablet    Sig: Take 1 tablet (25 mg total) by mouth daily.    Dispense:  90 tablet    Refill:  3    D/C FUROSEMIDE   metoprolol tartrate (LOPRESSOR) 50 MG tablet    Sig: TAKE 1 TABLET 2 HOURS PRIOR TO CT    Dispense:  1 tablet    Refill:  0     Disposition: FU with APP in 2 months. FU with Jireh Elmore C. Oval Linsey, MD, [facility] in 1 year.   I,Mathew Stumpf,acting as a Education administrator for Skeet Latch, MD.,have documented all relevant documentation on the behalf of Skeet Latch, MD,as directed by  Skeet Latch, MD while in the presence of Skeet Latch, MD.  I, Eugene Oval Linsey, MD have reviewed all documentation for this visit.  The documentation of the exam, diagnosis, procedures, and orders on 06/25/2021 are all accurate and complete.   Signed, Skeet Latch, MD  06/25/2021 11:42 AM    [facility]

## 2021-06-25 NOTE — Assessment & Plan Note (Signed)
Ms. Tuitt had a couple episodes of severe back pain that radiated around to her chest when walking.  She does obtain an exercise bike.  She does have known coronary calcifications.  We will get a coronary CTA to evaluate for obstructive coronary disease.  Continue aspirin, amlodipine, and rosuvastatin.

## 2021-06-25 NOTE — Assessment & Plan Note (Signed)
Lauren [name] was congratulated on her weight loss.  She has done a great job with the healthy weight and wellness clinic.

## 2021-06-25 NOTE — Assessment & Plan Note (Signed)
Lipids are well-controlled on rosuvastatin.  LDL was 34 on 09/2020.

## 2021-06-25 NOTE — Assessment & Plan Note (Addendum)
BP has been stable to mildly elevated but she still struggles with some edema.  She wants to stop amlodipine.  We will stop lasix and start chlorthalidone '25mg'$  daily.  If her BP is elevated but edema improves we can add back amlodipine 2.'5mg'$  or consider an alternative agent.  Continue ramipril.  Check a BMP in a week.  Track BP.  Goal <130/80.

## 2021-06-25 NOTE — Patient Instructions (Addendum)
Medication Instructions:  STOP AMLODIPINE   STOP FUROSEMIDE   START CHLORTHALIDONE 25 MG DAILY   TAKE METOPROLOL 50 MG 2 HOURS PRIOR TO CARDIAC CT   *If you need a refill on your cardiac medications before your next appointment, please call your pharmacy*  Lab Work: BMET IN 1 WEEK   If you have labs (blood work) drawn today and your tests are completely normal, you will receive your results only by: [place] (if you have MyChart) OR A paper copy in the mail If you have any lab test that is abnormal or we need to change your treatment, we will call you to review the results.   Testing/Procedures: Your physician has requested that you have cardiac CT. Cardiac computed tomography (CT) is a painless test that uses an x-ray machine to take clear, detailed pictures of your heart. For further information please visit [email or web address]. Please follow instruction sheet as given.  Follow-Up: At [facility], you and your health needs are our priority.  As part of our continuing mission to provide you with exceptional heart care, we have created designated Provider Care Teams.  These Care Teams include your primary Cardiologist (physician) and Advanced Practice Providers (APPs -  Physician Assistants and Nurse Practitioners) who all work together to provide you with the care you need, when you need it.  We recommend signing up for the patient portal called "MyChart".  Sign up information is provided on this After Visit Summary.  MyChart is used to connect with patients for Virtual Visits (Telemedicine).  Patients are able to view lab/test results, encounter notes, upcoming appointments, etc.  Non-urgent messages can be sent to your provider as well.   To learn more about what you can do with MyChart, go to [email or web address].    Your next appointment:   12 month(s)  The format for your next appointment:   In Person  Provider:   Skeet Latch, MD  Your physician  recommends that you schedule a follow-up appointment in:2 [place] NP   Other Instructions  MONITOR YOUR BLOOP PRESSURE AT HOME IF IT IS NOT CONSISTENTLY BELOW 130/80 CALL THE OFFICE     Your cardiac CT will be scheduled at one of the below locations:   [facility] [address] [place], [place] [address] [phone]  [place] [address] [place], [place] [address] [phone]  If scheduled at [facility], please arrive at the [facility] main entrance (entrance A) of [facility] 30 minutes prior to test start time. Proceed to the [facility] Radiology Department (first floor) to check-in and test prep.  If scheduled at [facility], please arrive 15 mins early for check-in and test prep.  Please follow these instructions carefully (unless otherwise directed):  Hold all erectile dysfunction medications at least 3 days (72 hrs) prior to test.  On the Night Before the Test: Be sure to Drink plenty of water. Do not consume any caffeinated/decaffeinated beverages or chocolate 12 hours prior to your test. Do not take any antihistamines 12 hours prior to your test. If the patient has contrast allergy: Patient will need a prescription for Prednisone and very clear instructions (as follows): Prednisone 50 mg - take 13 hours prior to test Take another Prednisone 50 mg 7 hours prior to test Take another Prednisone 50 mg 1 hour prior to test Take Benadryl 50 mg 1 hour prior to test Patient must  complete all four doses of above prophylactic medications. Patient will need a ride after test due to Benadryl.  On the Day of the Test: Drink plenty of water until 1 hour prior to the test. Do not eat any food 4 hours prior to the test. You may take your regular medications prior to the test.  Take metoprolol (Lopressor) two hours prior to test. HOLD  Furosemide/Hydrochlorothiazide morning of the test. FEMALES- please wear underwire-free bra if available, avoid dresses & tight clothing      After the Test: Drink plenty of water. After receiving IV contrast, you may experience a mild flushed feeling. This is normal. On occasion, you may experience a mild rash up to 24 hours after the test. This is not dangerous. If this occurs, you can take Benadryl 25 mg and increase your fluid intake. If you experience trouble breathing, this can be serious. If it is severe call 911 IMMEDIATELY. If it is mild, please call our office. If you take any of these medications: Glipizide/Metformin, Avandament, Glucavance, please do not take 48 hours after completing test unless otherwise instructed.  Please allow 2-4 weeks for scheduling of routine cardiac CTs. Some insurance companies require a pre-authorization which may delay scheduling of this test.   For non-scheduling related questions, please contact the cardiac imaging nurse navigator should you have any questions/concerns: Marchia Bond, Cardiac Imaging Nurse Navigator Gordy Clement, Cardiac Imaging Nurse Navigator [facility] Direct Office Dial: [phone]   For scheduling needs, including cancellations and rescheduling, please call [place], [phone].  Cardiac CT Angiogram A cardiac CT angiogram is a procedure to look at the heart and the area around the heart. It may be done to help find the cause of chest pains or other symptoms of heart disease. During this procedure, a substance called contrast dye is injected into the blood vessels in the area to be checked. A large X-ray machine, called a CT scanner, then takes detailed pictures of the heart and the surrounding area. The procedure is also sometimes called a coronary CTangiogram, coronary artery scanning, or CTA. A cardiac CT angiogram allows the health care provider to see how well blood is flowing to and from the  heart. The health care provider will be able to see if there are any problems, such as: Blockage or narrowing of the coronary arteries in the heart. Fluid around the heart. Signs of weakness or disease in the muscles, valves, and tissues of the heart. Tell a health care provider about: Any allergies you have. This is especially important if you have had a previous allergic reaction to contrast dye. All medicines you are taking, including vitamins, herbs, eye drops, creams, and over-the-counter medicines. Any blood disorders you have. Any surgeries you have had. Any medical conditions you have. Whether you are pregnant or may be pregnant. Any anxiety disorders, chronic pain, or other conditions you have that may increase your stress or prevent you from lying still. What are the risks? Generally, this is a safe procedure. However, problems may occur, including: Bleeding. Infection. Allergic reactions to medicines or dyes. Damage to other structures or organs. Kidney damage from the contrast dye that is used. Increased risk of cancer from radiation exposure. This risk is low. Talk with your health care provider about: The risks and benefits of testing. How you can receive the lowest dose of radiation. What happens before the procedure? Wear comfortable clothing and remove any jewelry, glasses, dentures, and hearing aids. Follow instructions from  your health care provider about eating and drinking. This may include: For 12 hours before the procedure -- avoid caffeine. This includes tea, coffee, soda, energy drinks, and diet pills. Drink plenty of water or other fluids that do not have caffeine in them. Being well hydrated can prevent complications. For 4-6 hours before the procedure -- stop eating and drinking. The contrast dye can cause nausea, but this is less likely if your stomach is empty. Ask your health care provider about changing or stopping your regular medicines. This is especially  important if you are taking diabetes medicines, blood thinners, or medicines to treat problems with erections (erectile dysfunction). What happens during the procedure?  Hair on your chest may need to be removed so that small sticky patches called electrodes can be placed on your chest. These will transmit information that helps to monitor your heart during the procedure. An IV will be inserted into one of your veins. You might be given a medicine to control your heart rate during the procedure. This will help to ensure that good images are obtained. You will be asked to lie on an exam table. This table will slide in and out of the CT machine during the procedure. Contrast dye will be injected into the IV. You might feel warm, or you may get a metallic taste in your mouth. You will be given a medicine called nitroglycerin. This will relax or dilate the arteries in your heart. The table that you are lying on will move into the CT machine tunnel for the scan. The person running the machine will give you instructions while the scans are being done. You may be asked to: Keep your arms above your head. Hold your breath. Stay very still, even if the table is moving. When the scanning is complete, you will be moved out of the machine. The IV will be removed. The procedure may vary among health care providers and hospitals. What can I expect after the procedure? After your procedure, it is common to have: A metallic taste in your mouth from the contrast dye. A feeling of warmth. A headache from the nitroglycerin. Follow these instructions at home: Take over-the-counter and prescription medicines only as told by your health care provider. If you are told, drink enough fluid to keep your urine pale yellow. This will help to flush the contrast dye out of your body. Most people can return to their normal activities right after the procedure. Ask your health care provider what activities are safe for  you. It is up to you to get the results of your procedure. Ask your health care provider, or the department that is doing the procedure, when your results will be ready. Keep all follow-up visits as told by your health care provider. This is important. Contact a health care provider if: You have any symptoms of allergy to the contrast dye. These include: Shortness of breath. Rash or hives. A racing heartbeat. Summary A cardiac CT angiogram is a procedure to look at the heart and the area around the heart. It may be done to help find the cause of chest pains or other symptoms of heart disease. During this procedure, a large X-ray machine, called a CT scanner, takes detailed pictures of the heart and the surrounding area after a contrast dye has been injected into blood vessels in the area. Ask your health care provider about changing or stopping your regular medicines before the procedure. This is especially important if you are taking  diabetes medicines, blood thinners, or medicines to treat erectile dysfunction. If you are told, drink enough fluid to keep your urine pale yellow. This will help to flush the contrast dye out of your body. This information is not intended to replace advice given to you by your health care provider. Make sure you discuss any questions you have with your healthcare provider. Document Revised: 06/13/2019 Document Reviewed: 06/13/2019 Elsevier Patient Education  [place].

## 2021-06-26 ENCOUNTER — Encounter (HOSPITAL_BASED_OUTPATIENT_CLINIC_OR_DEPARTMENT_OTHER): Payer: Self-pay

## 2021-06-30 DIAGNOSIS — G894 Chronic pain syndrome: Secondary | ICD-10-CM | POA: Diagnosis not present

## 2021-06-30 DIAGNOSIS — M7631 Iliotibial band syndrome, right leg: Secondary | ICD-10-CM | POA: Diagnosis not present

## 2021-06-30 DIAGNOSIS — M150 Primary generalized (osteo)arthritis: Secondary | ICD-10-CM | POA: Diagnosis not present

## 2021-06-30 DIAGNOSIS — M6281 Muscle weakness (generalized): Secondary | ICD-10-CM | POA: Diagnosis not present

## 2021-06-30 DIAGNOSIS — M25561 Pain in right knee: Secondary | ICD-10-CM | POA: Diagnosis not present

## 2021-06-30 DIAGNOSIS — Z79899 Other long term (current) drug therapy: Secondary | ICD-10-CM | POA: Diagnosis not present

## 2021-06-30 DIAGNOSIS — R2681 Unsteadiness on feet: Secondary | ICD-10-CM | POA: Diagnosis not present

## 2021-07-01 ENCOUNTER — Telehealth (HOSPITAL_COMMUNITY): Payer: Self-pay | Admitting: *Deleted

## 2021-07-01 NOTE — Telephone Encounter (Signed)
Reaching out to patient to offer assistance regarding upcoming cardiac imaging study; pt verbalizes understanding of appt date/time, parking situation and where to check in, pre-test NPO status and medications ordered, and verified current allergies; name and call back number provided for further questions should they arise  Gordy Clement RN Navigator Cardiac Imaging [facility] [phone] office [phone] cell  Patient to take '50mg'$  metoprolol tartrate two hours prior to cardiac CT.  Patient states her daughter will be driving her to appointment.

## 2021-07-02 DIAGNOSIS — M25561 Pain in right knee: Secondary | ICD-10-CM | POA: Diagnosis not present

## 2021-07-02 DIAGNOSIS — R2681 Unsteadiness on feet: Secondary | ICD-10-CM | POA: Diagnosis not present

## 2021-07-02 DIAGNOSIS — M7631 Iliotibial band syndrome, right leg: Secondary | ICD-10-CM | POA: Diagnosis not present

## 2021-07-02 DIAGNOSIS — M6281 Muscle weakness (generalized): Secondary | ICD-10-CM | POA: Diagnosis not present

## 2021-07-03 ENCOUNTER — Ambulatory Visit (HOSPITAL_COMMUNITY)
Admission: RE | Admit: 2021-07-03 | Discharge: 2021-07-03 | Disposition: A | Discharge status: Home / Self Care | Payer: PPO | Source: Ambulatory Visit | Attending: Cardiovascular Disease | Admitting: Cardiovascular Disease

## 2021-07-03 ENCOUNTER — Other Ambulatory Visit: Payer: Self-pay

## 2021-07-03 DIAGNOSIS — I2510 Atherosclerotic heart disease of native coronary artery without angina pectoris: Secondary | ICD-10-CM | POA: Insufficient documentation

## 2021-07-03 DIAGNOSIS — E7800 Pure hypercholesterolemia, unspecified: Secondary | ICD-10-CM | POA: Diagnosis not present

## 2021-07-03 DIAGNOSIS — I700 Atherosclerosis of aorta: Secondary | ICD-10-CM | POA: Insufficient documentation

## 2021-07-03 DIAGNOSIS — R0789 Other chest pain: Secondary | ICD-10-CM

## 2021-07-03 DIAGNOSIS — I10 Essential (primary) hypertension: Secondary | ICD-10-CM | POA: Diagnosis not present

## 2021-07-03 DIAGNOSIS — I517 Cardiomegaly: Secondary | ICD-10-CM | POA: Diagnosis not present

## 2021-07-03 DIAGNOSIS — R911 Solitary pulmonary nodule: Secondary | ICD-10-CM | POA: Insufficient documentation

## 2021-07-03 MED ORDER — NITROGLYCERIN 0.4 MG SL SUBL
0.8000 mg | SUBLINGUAL_TABLET | Freq: Once | SUBLINGUAL | Status: AC
  Administered 2021-07-03: 0.8 mg via SUBLINGUAL

## 2021-07-03 MED ORDER — NITROGLYCERIN 0.4 MG SL SUBL
SUBLINGUAL_TABLET | SUBLINGUAL | Status: AC
  Filled 2021-07-03: qty 2

## 2021-07-03 MED ORDER — IOHEXOL 350 MG/ML SOLN
100.0000 mL | Freq: Once | INTRAVENOUS | Status: AC | PRN
  Administered 2021-07-03: 100 mL via INTRAVENOUS

## 2021-07-07 DIAGNOSIS — E1169 Type 2 diabetes mellitus with other specified complication: Secondary | ICD-10-CM | POA: Diagnosis not present

## 2021-07-07 DIAGNOSIS — I10 Essential (primary) hypertension: Secondary | ICD-10-CM | POA: Diagnosis not present

## 2021-07-07 DIAGNOSIS — E7800 Pure hypercholesterolemia, unspecified: Secondary | ICD-10-CM | POA: Diagnosis not present

## 2021-07-07 DIAGNOSIS — K219 Gastro-esophageal reflux disease without esophagitis: Secondary | ICD-10-CM | POA: Diagnosis not present

## 2021-07-07 DIAGNOSIS — C50919 Malignant neoplasm of unspecified site of unspecified female breast: Secondary | ICD-10-CM | POA: Diagnosis not present

## 2021-07-08 ENCOUNTER — Encounter (INDEPENDENT_AMBULATORY_CARE_PROVIDER_SITE_OTHER): Payer: Self-pay | Admitting: Family Medicine

## 2021-07-08 ENCOUNTER — Other Ambulatory Visit: Payer: Self-pay

## 2021-07-08 ENCOUNTER — Ambulatory Visit (INDEPENDENT_AMBULATORY_CARE_PROVIDER_SITE_OTHER): Payer: PPO | Admitting: Family Medicine

## 2021-07-08 VITALS — BP 127/80 | HR 75 | Temp 97.9°F | Ht 64.0 in | Wt 198.0 lb

## 2021-07-08 DIAGNOSIS — Z6837 Body mass index (BMI) 37.0-37.9, adult: Secondary | ICD-10-CM

## 2021-07-08 DIAGNOSIS — E1169 Type 2 diabetes mellitus with other specified complication: Secondary | ICD-10-CM

## 2021-07-08 NOTE — Progress Notes (Signed)
Chief Complaint:   OBESITY Lauren [name] is here to discuss her progress with her obesity treatment plan along with follow-up of her obesity related diagnoses. Lauren [name] is on the Category 2 Plan and states she is following her eating plan approximately 75% of the time. Lauren [name] states she is doing physical therapy for 120 minutes 2 times per week.  Today's visit was #: 6 Starting weight: 219 lbs Starting date: 04/22/2021 Today's weight: 198 lbs Today's date: 07/08/2021 Total lbs lost to date: 21 lbs Total lbs lost since last in-office visit: 3 lbs  Interim History: Lauren [name] actually had [employer] one time in the last few weeks but still lost 3 lbs. She says that she does not eat enough protein at lunch. She tends to drink decaf, unsweet tea. She notices increased hunger when she is stressed. She has one more PT session.   Subjective:   1. Type 2 diabetes mellitus with other specified complication, without long-term current use of insulin (HCC) Lauren [name]'s diabetes mellitus is well controlled. Her last A1C was 6.7. She is on Actos and Metformin 1000 twice daily. Her FBS have been in low 90's. Her night-time CBGs are in the 120's. She denies hypoglycemia.  Lab Results  Component Value Date   HGBA1C 6.7 (H) 04/22/2021   HGBA1C 8.9 (H) 11/13/2018   HGBA1C 7.7 (H) 11/25/2017   Lab Results  Component Value Date   LDLCALC 51 04/22/2021   CREATININE 0.86 06/04/2021   Lab Results  Component Value Date   INSULIN 22.3 04/22/2021    Assessment/Plan:   1. Type 2 diabetes mellitus with other specified complication, without long-term current use of insulin (HCC) Lauren [name] will continue Actos and glucose.   2. Obesity with current BMI 33.97 Lauren [name] is currently in the action stage of change. As such, her goal is to continue with weight loss efforts. She has agreed to the Category 2 Plan and keeping a food journal and adhering to recommended goals of 400-600 calories and 35 grams of   protein at supper.  Exercise goals:  As is.  Lauren [name] will walk 20-30 minutes 3 times per week. She will contain physical therapy exercises.  Behavioral modification strategies: increasing lean protein intake and meal planning and cooking strategies.  Lauren [name] has agreed to follow-up with our clinic in 2 weeks with Dr. Leafy Ro.  Objective:   Blood pressure 127/80, pulse 75, temperature 97.9 F (36.6 C), height '5\' 4"'$  (1.626 m), weight 198 lb (89.8 kg), SpO2 97 %. Body mass index is 33.99 kg/m.  General: Cooperative, alert, well developed, in no acute distress. HEENT: Conjunctivae and lids unremarkable. Cardiovascular: Regular rhythm.  Lungs: Normal work of breathing. Neurologic: No focal deficits.   Lab Results  Component Value Date   CREATININE 0.86 06/04/2021   BUN 19 06/04/2021   NA 141 06/04/2021   K 4.2 06/04/2021   CL 105 06/04/2021   CO2 26 06/04/2021   Lab Results  Component Value Date   ALT 11 06/04/2021   AST 18 06/04/2021   ALKPHOS 47 06/04/2021   BILITOT 0.3 06/04/2021   Lab Results  Component Value Date   HGBA1C 6.7 (H) 04/22/2021   HGBA1C 8.9 (H) 11/13/2018   HGBA1C 7.7 (H) 11/25/2017   HGBA1C 7.2 (H) 08/24/2017   HGBA1C 6.7 (H) 11/16/2016   Lab Results  Component Value Date   INSULIN 22.3 04/22/2021   Lab Results  Component Value Date   TSH 1.680 04/22/2021   Lab Results  Component Value  Date   CHOL 129 04/22/2021   HDL 56 04/22/2021   LDLCALC 51 04/22/2021   TRIG 125 04/22/2021   Lab Results  Component Value Date   VD25OH 40.7 04/22/2021   Lab Results  Component Value Date   WBC 7.6 06/04/2021   HGB 11.8 (L) 06/04/2021   HCT 36.9 06/04/2021   MCV 87.0 06/04/2021   PLT 275 06/04/2021   No results found for: IRON, TIBC, FERRITIN  Obesity Behavioral Intervention:   Approximately 15 minutes were spent on the discussion below.  ASK: We discussed the diagnosis of obesity with Sruti today and Bibiana agreed to give [place] permission to  discuss obesity behavioral modification therapy today.  ASSESS: Ophia has the diagnosis of obesity and her BMI today is 34.1. Nitza is in the action stage of change.   ADVISE: Brinlynn was educated on the multiple health risks of obesity as well as the benefit of weight loss to improve her health. She was advised of the need for long term treatment and the importance of lifestyle modifications to improve her current health and to decrease her risk of future health problems.  AGREE: Multiple dietary modification options and treatment options were discussed and Oradell agreed to follow the recommendations documented in the above note.  ARRANGE: Niang was educated on the importance of frequent visits to treat obesity as outlined per CMS and USPSTF guidelines and agreed to schedule her next follow up appointment today.  Attestation Statements:   Reviewed by clinician on day of visit: allergies, medications, problem list, medical history, surgical history, family history, social history, and previous encounter notes.  I, Lizbeth Bark, RMA, am acting as Location manager for [employer], [place].   I have reviewed the above documentation for accuracy and completeness, and I agree with the above. -  Georgianne Fick, FNP

## 2021-07-09 ENCOUNTER — Encounter (INDEPENDENT_AMBULATORY_CARE_PROVIDER_SITE_OTHER): Payer: Self-pay | Admitting: Family Medicine

## 2021-07-14 DIAGNOSIS — G894 Chronic pain syndrome: Secondary | ICD-10-CM | POA: Diagnosis not present

## 2021-07-14 DIAGNOSIS — M150 Primary generalized (osteo)arthritis: Secondary | ICD-10-CM | POA: Diagnosis not present

## 2021-07-14 DIAGNOSIS — I10 Essential (primary) hypertension: Secondary | ICD-10-CM | POA: Diagnosis not present

## 2021-07-14 DIAGNOSIS — Z79899 Other long term (current) drug therapy: Secondary | ICD-10-CM | POA: Diagnosis not present

## 2021-07-14 DIAGNOSIS — M47816 Spondylosis without myelopathy or radiculopathy, lumbar region: Secondary | ICD-10-CM | POA: Diagnosis not present

## 2021-07-14 DIAGNOSIS — R0789 Other chest pain: Secondary | ICD-10-CM | POA: Diagnosis not present

## 2021-07-14 LAB — BASIC METABOLIC PANEL
BUN/Creatinine Ratio: 15 (ref 12–28)
BUN: 13 mg/dL (ref 8–27)
CO2: 25 mmol/L (ref 20–29)
Calcium: 9.6 mg/dL (ref 8.7–10.3)
Chloride: 97 mmol/L (ref 96–106)
Creatinine, Ser: 0.84 mg/dL (ref 0.57–1.00)
Glucose: 134 mg/dL — ABNORMAL HIGH (ref 65–99)
Potassium: 4.1 mmol/L (ref 3.5–5.2)
Sodium: 138 mmol/L (ref 134–144)
eGFR: 72 mL/min/{1.73_m2} (ref >59)

## 2021-07-22 ENCOUNTER — Ambulatory Visit (INDEPENDENT_AMBULATORY_CARE_PROVIDER_SITE_OTHER): Payer: PPO | Admitting: Family Medicine

## 2021-07-22 ENCOUNTER — Encounter (INDEPENDENT_AMBULATORY_CARE_PROVIDER_SITE_OTHER): Payer: Self-pay | Admitting: Family Medicine

## 2021-07-22 ENCOUNTER — Other Ambulatory Visit: Payer: Self-pay

## 2021-07-22 VITALS — BP 119/77 | HR 77 | Temp 97.5°F | Ht 64.0 in | Wt 195.0 lb

## 2021-07-22 DIAGNOSIS — Z6837 Body mass index (BMI) 37.0-37.9, adult: Secondary | ICD-10-CM | POA: Diagnosis not present

## 2021-07-22 DIAGNOSIS — R5383 Other fatigue: Secondary | ICD-10-CM

## 2021-07-22 DIAGNOSIS — E559 Vitamin D deficiency, unspecified: Secondary | ICD-10-CM

## 2021-07-22 MED ORDER — VITAMIN D (ERGOCALCIFEROL) 1.25 MG (50000 UNIT) PO CAPS: 50000.0000 [IU] | ORAL_CAPSULE | ORAL | 0 refills | Status: DC

## 2021-07-23 NOTE — Progress Notes (Signed)
Chief Complaint:   OBESITY Lauren [name] is here to discuss her progress with her obesity treatment plan along with follow-up of her obesity related diagnoses. Lauren [name] is on the Category 2 Plan and keeping a food journal and adhering to recommended goals of 400-600 calories and 35 grams of protein at supper daily and states she is following her eating plan approximately 70% of the time. Sanvi states she is doing 0 minutes 0 times per week.  Today's visit was #: 7 Starting weight: 219 lbs Starting date: 04/22/2021 Today's weight: 195 lbs Today's date: 07/22/2021 Total lbs lost to date: 24 Total lbs lost since last in-office visit: 3  Interim History: Lauren [name] continues to do well with weight loss. She notes her hunger is mostly controlled, but sometimes she isn't able to eat all of her food.  Subjective:   1. Vitamin D deficiency Lauren [name] is stable on Vit D, but her level is not yet at goal.  2. 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 She has some time for herself as he sleeps frequently, but she is still dealing with a high level of stress and grieving.  Assessment/Plan:   1. Vitamin D deficiency Low Vitamin D level contributes to fatigue and are associated with obesity, breast, and colon cancer. We will refill prescription Vitamin D for 1 month. Lauren [name] will follow-up for routine testing of Vitamin D, at least 2-3 times per year to avoid over-replacement.  - Vitamin D, Ergocalciferol, (DRISDOL) 1.25 MG (50000 UNIT) CAPS capsule; Take 1 capsule (50,000 Units total) by mouth every 7 (seven) days.  Dispense: 4 capsule; Refill: 0  2. Caregiver with fatigue Lauren [name] was given some suggestions that she can look into and she was offered support and encouragement for what she is going through. We will continue to monitor closely.  3. Obesity with current BMI 33.5 Lauren [name] is currently in the action stage of change. As such, her goal is  to continue with weight loss efforts. She has agreed to the Category 2 Plan and keeping a food journal and adhering to recommended goals of 400-600 calories and 35+ grams of protein at supper daily.   Behavioral modification strategies: meal planning and cooking strategies.  Lauren [name] has agreed to follow-up with our clinic in 2 weeks. She was informed of the importance of frequent follow-up visits to maximize her success with intensive lifestyle modifications for her multiple health conditions.   Objective:   Blood pressure 119/77, pulse 77, temperature (!) 97.5 F (36.4 C), height 5\' 4"  (1.626 m), weight 195 lb (88.5 kg), SpO2 97 %. Body mass index is 33.47 kg/m.  General: Cooperative, alert, well developed, in no acute distress. HEENT: Conjunctivae and lids unremarkable. Cardiovascular: Regular rhythm.  Lungs: Normal work of breathing. Neurologic: No focal deficits.   Lab Results  Component Value Date   CREATININE 0.84 07/14/2021   BUN 13 07/14/2021   NA 138 07/14/2021   K 4.1 07/14/2021   CL 97 07/14/2021   CO2 25 07/14/2021   Lab Results  Component Value Date   ALT 11 06/04/2021   AST 18 06/04/2021   ALKPHOS 47 06/04/2021   BILITOT 0.3 06/04/2021   Lab Results  Component Value Date   HGBA1C 6.7 (H) 04/22/2021   HGBA1C 8.9 (H) 11/13/2018   HGBA1C 7.7 (H) 11/25/2017   HGBA1C 7.2 (H) 08/24/2017   HGBA1C 6.7 (H) 11/16/2016   Lab Results  Component Value Date   INSULIN  22.3 04/22/2021   Lab Results  Component Value Date   TSH 1.680 04/22/2021   Lab Results  Component Value Date   CHOL 129 04/22/2021   HDL 56 04/22/2021   LDLCALC 51 04/22/2021   TRIG 125 04/22/2021   Lab Results  Component Value Date   VD25OH 40.7 04/22/2021   Lab Results  Component Value Date   WBC 7.6 06/04/2021   HGB 11.8 (L) 06/04/2021   HCT 36.9 06/04/2021   MCV 87.0 06/04/2021   PLT 275 06/04/2021   No results found for: IRON, TIBC, FERRITIN  Attestation Statements:    Reviewed by clinician on day of visit: allergies, medications, problem list, medical history, surgical history, family history, social history, and previous encounter notes.  Time spent on visit including pre-visit chart review and post-visit care and charting was 40 minutes.    I, Trixie [name], am acting as transcriptionist for Dennard Nip, MD.  I have reviewed the above documentation for accuracy and completeness, and I agree with the above. -  Dennard Nip, MD

## 2021-07-30 DIAGNOSIS — Z79899 Other long term (current) drug therapy: Secondary | ICD-10-CM | POA: Diagnosis not present

## 2021-07-30 DIAGNOSIS — M47816 Spondylosis without myelopathy or radiculopathy, lumbar region: Secondary | ICD-10-CM | POA: Diagnosis not present

## 2021-07-30 DIAGNOSIS — M150 Primary generalized (osteo)arthritis: Secondary | ICD-10-CM | POA: Diagnosis not present

## 2021-07-30 DIAGNOSIS — G894 Chronic pain syndrome: Secondary | ICD-10-CM | POA: Diagnosis not present

## 2021-08-05 ENCOUNTER — Encounter (INDEPENDENT_AMBULATORY_CARE_PROVIDER_SITE_OTHER): Payer: Self-pay | Admitting: Family Medicine

## 2021-08-05 ENCOUNTER — Telehealth (HOSPITAL_BASED_OUTPATIENT_CLINIC_OR_DEPARTMENT_OTHER): Payer: Self-pay | Admitting: Cardiovascular Disease

## 2021-08-05 ENCOUNTER — Other Ambulatory Visit: Payer: Self-pay

## 2021-08-05 ENCOUNTER — Ambulatory Visit (INDEPENDENT_AMBULATORY_CARE_PROVIDER_SITE_OTHER): Payer: PPO | Admitting: Family Medicine

## 2021-08-05 VITALS — BP 116/75 | HR 73 | Temp 97.6°F | Ht 64.0 in | Wt 193.0 lb

## 2021-08-05 DIAGNOSIS — E1169 Type 2 diabetes mellitus with other specified complication: Secondary | ICD-10-CM

## 2021-08-05 DIAGNOSIS — Z6837 Body mass index (BMI) 37.0-37.9, adult: Secondary | ICD-10-CM | POA: Diagnosis not present

## 2021-08-05 DIAGNOSIS — Z636 Dependent relative needing care at home: Secondary | ICD-10-CM

## 2021-08-05 NOTE — Telephone Encounter (Addendum)
   Name: Lauren [name]  DOB: 1946/03/22  MRN: [id number]   Primary Cardiologist: Skeet Latch, MD  Chart reviewed as part of pre-operative protocol coverage. Patient was contacted 08/05/2021 in reference to pre-operative risk assessment for pending surgery as outlined below.  Lauren ORDAZ was last seen on 06/2021 by Dr. Oval Linsey, chart reviewed. History includes coronary calcification, breast CA, arthritis, DM, GERD, HTN, HLD, PVCs. Stress test 2019 was low risk. 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 Dr. Oval Linsey recommended continued medical therapy and surveillance.  Reached out to patient to ensure no new issues since last OV. Got VM. LMTCB.   Addendum: patient returned call. I reached out to patient for update on how she is doing. The patient affirms she has been doing well without any new cardiac symptoms. Therefore, based on ACC/AHA guidelines, the patient would be at acceptable risk for the planned procedure without further cardiovascular testing. The patient was advised that if she develops new symptoms prior to surgery to contact our office to arrange for a follow-up visit, and she verbalized understanding. From cardiac standpoint, she is OK to hold ASA 7 days prior to each procedure given no prior MI, PCI, CABG, TIA, stroke. I did relay this to the patient since her first procedure is scheduled 08/13/21. We typically advise that blood thinners be resumed when felt safe by performing physician.  Will route this bundled recommendation to requesting provider via Epic fax function. Please call with questions.   Charlie Pitter, PA-C 08/05/2021, 2:53 PM

## 2021-08-05 NOTE — Telephone Encounter (Signed)
   [facility] Pre-operative Risk Assessment    Patient Name: Lauren [name]  DOB: [birth year] MRN: [id number]  Request for surgical clearance:  What type of surgery is being performed? MBB  When is this surgery scheduled? 08/13/21, 09/02/21  What type of clearance is required (medical clearance vs. Pharmacy clearance to hold med vs. Both)? Med  Are there any medications that need to be held prior to surgery and how long? ASA 81; length of time not specified  Practice name and name of physician performing surgery? [facility]  What is the office phone number? [phone]   7.   What is the office fax number? [phone]  8.   Anesthesia type (None, local, MAC, general) ? Not specified   Francella Solian 08/05/2021, 2:41 PM  _________________________________________________________________   (provider comments below)

## 2021-08-05 NOTE — Progress Notes (Signed)
Chief Complaint:   OBESITY Lauren [name] is here to discuss her progress with her obesity treatment plan along with follow-up of her obesity related diagnoses. Lauren [name] is on the Category 2 Plan and keeping a food journal and adhering to recommended goals of 400-600 calories and 35 grams of at supper daily protein and states she is following her eating plan approximately 75% of the time. Lauren [name] states she is doing 0 minutes 0 times per week.  Today's visit was #: 8 Starting weight: 219 lbs Starting date: 04/22/2021 Today's weight: 193 lbs Today's date: 08/05/2021 Total lbs lost to date: 26 lbs Total lbs lost since last in-office visit: 2 lbs  Interim History: Lauren [name] is doing very well with weight loss. 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 She says he is getting less active and worse overall. He is not eating well.  She does not always follow the plan at lunch, sometimes having nabs or a granola bar and fruit.  Subjective:   1. Caregiver burden Lauren [name] is really struggling with caregiver fatigue. She doubts she will be able to have total knee replacement as planned in January due to her husband's worsening condition. She has 2 adult children and neighbors who are willing to help.  2. Type 2 diabetes mellitus with other specified complication, without long-term current use of insulin (Lauren [name]) Lauren [name]'s diabetes mellitus is well controlled. Her last A1C was 5.7. She is on Actos and Metformin.  Lab Results  Component Value Date   HGBA1C 6.7 (H) 04/22/2021   HGBA1C 8.9 (H) 11/13/2018   HGBA1C 7.7 (H) 11/25/2017   Lab Results  Component Value Date   LDLCALC 51 04/22/2021   CREATININE 0.84 07/14/2021   Lab Results  Component Value Date   INSULIN 22.3 04/22/2021    Assessment/Plan:   1. Caregiver burden I encouraged Lauren [name] to engage her children or neighbors to help and give her some respite time. She likes to do stained glass and I  encouraged her to pursue this for mental health well-being.  2. Type 2 diabetes mellitus with other specified complication, without long-term current use of insulin (HCC) Lauren [name] will continue Actos and Metformin.   3. Obesity with current BMI 33.11 Lauren [name] is currently in the action stage of change. As such, her goal is to continue with weight loss efforts. She has agreed to the Category 2 Plan and keeping a food journal and adhering to recommended goals of 400-600 calories and 35 grams of protein at supper.  Lauren [name] may have a protein bar with 20 grams of protein and < 300 calories instead lunch along with a piece of fruit.  Exercise goals: No exercise has been prescribed at this time.  Behavioral modification strategies: increasing lean protein intake.  Lauren [name] has agreed to follow-up with our clinic in 2 weeks with Dr. Leafy Ro.  Objective:   Blood pressure 116/75, pulse 73, temperature 97.6 F (36.4 C), height 5\' 4"  (1.626 m), weight 193 lb (87.5 kg), SpO2 97 %. Body mass index is 33.13 kg/m.  General: Cooperative, alert, well developed, in no acute distress. HEENT: Conjunctivae and lids unremarkable. Cardiovascular: Regular rhythm.  Lungs: Normal work of breathing. Neurologic: No focal deficits.   Lab Results  Component Value Date   CREATININE 0.84 07/14/2021   BUN 13 07/14/2021   NA 138 07/14/2021   K 4.1 07/14/2021   CL 97 07/14/2021   CO2 25 07/14/2021   Lab Results  Component  Value Date   ALT 11 06/04/2021   AST 18 06/04/2021   ALKPHOS 47 06/04/2021   BILITOT 0.3 06/04/2021   Lab Results  Component Value Date   HGBA1C 6.7 (H) 04/22/2021   HGBA1C 8.9 (H) 11/13/2018   HGBA1C 7.7 (H) 11/25/2017   HGBA1C 7.2 (H) 08/24/2017   HGBA1C 6.7 (H) 11/16/2016   Lab Results  Component Value Date   INSULIN 22.3 04/22/2021   Lab Results  Component Value Date   TSH 1.680 04/22/2021   Lab Results  Component Value Date   CHOL 129 04/22/2021   HDL 56 04/22/2021    LDLCALC 51 04/22/2021   TRIG 125 04/22/2021   Lab Results  Component Value Date   VD25OH 40.7 04/22/2021   Lab Results  Component Value Date   WBC 7.6 06/04/2021   HGB 11.8 (L) 06/04/2021   HCT 36.9 06/04/2021   MCV 87.0 06/04/2021   PLT 275 06/04/2021   No results found for: IRON, TIBC, FERRITIN  Attestation Statements:   Reviewed by clinician on day of visit: allergies, medications, problem list, medical history, surgical history, family history, social history, and previous encounter notes.  Time spent on visit including pre-visit chart review and post-visit care and charting was 32 minutes.   I, Lizbeth Bark, RMA, am acting as Location manager for [employer], [place].   I have reviewed the above documentation for accuracy and completeness, and I agree with the above. -  Georgianne Fick, FNP

## 2021-08-06 DIAGNOSIS — Z23 Encounter for immunization: Secondary | ICD-10-CM | POA: Diagnosis not present

## 2021-08-10 NOTE — Telephone Encounter (Signed)
Lauren [name] with [facility] is following up. She states their office never received a recommendation. She would like to know if the fax can be resubmitted to [phone].

## 2021-08-11 NOTE — Telephone Encounter (Signed)
For some reason, the requesting office did not receive our previous response, I have resent it. Please make sure they have received it this time.

## 2021-08-19 ENCOUNTER — Other Ambulatory Visit: Payer: Self-pay

## 2021-08-19 ENCOUNTER — Encounter (INDEPENDENT_AMBULATORY_CARE_PROVIDER_SITE_OTHER): Payer: Self-pay | Admitting: Family Medicine

## 2021-08-19 ENCOUNTER — Ambulatory Visit (INDEPENDENT_AMBULATORY_CARE_PROVIDER_SITE_OTHER): Payer: PPO | Admitting: Family Medicine

## 2021-08-19 VITALS — BP 122/62 | HR 68 | Temp 97.6°F | Ht 64.0 in | Wt 192.0 lb

## 2021-08-19 DIAGNOSIS — Z6837 Body mass index (BMI) 37.0-37.9, adult: Secondary | ICD-10-CM

## 2021-08-19 DIAGNOSIS — E559 Vitamin D deficiency, unspecified: Secondary | ICD-10-CM

## 2021-08-19 DIAGNOSIS — E1169 Type 2 diabetes mellitus with other specified complication: Secondary | ICD-10-CM

## 2021-08-19 MED ORDER — VITAMIN D (ERGOCALCIFEROL) 1.25 MG (50000 UNIT) PO CAPS: 50000.0000 [IU] | ORAL_CAPSULE | ORAL | 0 refills | Status: DC

## 2021-08-19 NOTE — Progress Notes (Signed)
Chief Complaint:   OBESITY Lauren [name] is here to discuss her progress with her obesity treatment plan along with follow-up of her obesity related diagnoses. Lauren [name] is on the Category 2 Plan and keeping a food journal and adhering to recommended goals of 400-600 calories and 35 grams of protein at supper daily and states she is following her eating plan approximately 75% of the time. Lauren [name] states she is doing 0 minutes 0 times per week.  Today's visit was #: 9 Starting weight: 219 lbs Starting date: 04/22/2021 Today's weight: 192 lbs Today's date: 08/19/2021 Total lbs lost to date: 27 Total lbs lost since last in-office visit: 1  Interim History: Salvatore continues to do well with weight loss. She has had a lot of challenging with her husband's worsening health. She is open to discussing other recipes which would be easy an work for her and her husband.  Subjective:   1. Type 2 diabetes mellitus with other specified complication, without long-term current use of insulin (HCC) Lauren [name] continues to work on diet and weight loss. She is stable on her medications with no signs of hypoglycemia.  2. Vitamin D deficiency Lauren [name]'s Vit D level is at goal. She denies signs of over-replacement.  Assessment/Plan:   1. Type 2 diabetes mellitus with other specified complication, without long-term current use of insulin (HCC) Lauren [name] will continue with diet and exercise, and will continue to follow up as directed. Good blood sugar control is important to decrease the likelihood of diabetic complications such as nephropathy, neuropathy, limb loss, blindness, coronary artery disease, and death. Intensive lifestyle modification including diet, exercise and weight loss are the first line of treatment for diabetes.   2. Vitamin D deficiency Low Vitamin D level contributes to fatigue and are associated with obesity, breast, and colon cancer. We will refill prescription Vitamin D for 1 month. Lauren [name]  will follow-up for routine testing of Vitamin D, at least 2-3 times per year to avoid over-replacement.  3. Obesity with current BMI 33.1 Lauren [name] is currently in the action stage of change. As such, her goal is to continue with weight loss efforts. She has agreed to the Category 2 Plan and keeping a food journal and adhering to recommended goals of 400-600 calories and 35+ grams of protein at supper daily.   Behavioral modification strategies: increasing lean protein intake and decreasing sodium intake.  Lauren [name] has agreed to follow-up with our clinic in 3 weeks. She was informed of the importance of frequent follow-up visits to maximize her success with intensive lifestyle modifications for her multiple health conditions.   Objective:   Blood pressure 122/62, pulse 68, temperature 97.6 F (36.4 C), height 5\' 4"  (1.626 m), weight 192 lb (87.1 kg), SpO2 96 %. Body mass index is 32.96 kg/m.  General: Cooperative, alert, well developed, in no acute distress. HEENT: Conjunctivae and lids unremarkable. Cardiovascular: Regular rhythm.  Lungs: Normal work of breathing. Neurologic: No focal deficits.   Lab Results  Component Value Date   CREATININE 0.84 07/14/2021   BUN 13 07/14/2021   NA 138 07/14/2021   K 4.1 07/14/2021   CL 97 07/14/2021   CO2 25 07/14/2021   Lab Results  Component Value Date   ALT 11 06/04/2021   AST 18 06/04/2021   ALKPHOS 47 06/04/2021   BILITOT 0.3 06/04/2021   Lab Results  Component Value Date   HGBA1C 6.7 (H) 04/22/2021   HGBA1C 8.9 (H) 11/13/2018   HGBA1C 7.7 (H) 11/25/2017   HGBA1C  7.2 (H) 08/24/2017   HGBA1C 6.7 (H) 11/16/2016   Lab Results  Component Value Date   INSULIN 22.3 04/22/2021   Lab Results  Component Value Date   TSH 1.680 04/22/2021   Lab Results  Component Value Date   CHOL 129 04/22/2021   HDL 56 04/22/2021   LDLCALC 51 04/22/2021   TRIG 125 04/22/2021   Lab Results  Component Value Date   VD25OH 40.7 04/22/2021    Lab Results  Component Value Date   WBC 7.6 06/04/2021   HGB 11.8 (L) 06/04/2021   HCT 36.9 06/04/2021   MCV 87.0 06/04/2021   PLT 275 06/04/2021   No results found for: IRON, TIBC, FERRITIN  Attestation Statements:   Reviewed by clinician on day of visit: allergies, medications, problem list, medical history, surgical history, family history, social history, and previous encounter notes.  Time spent on visit including pre-visit chart review and post-visit care and charting was 42 minutes.    I, Trixie [name], am acting as transcriptionist for Dennard Nip, MD.  I have reviewed the above documentation for accuracy and completeness, and I agree with the above. -  Dennard Nip, MD

## 2021-08-25 ENCOUNTER — Ambulatory Visit (HOSPITAL_BASED_OUTPATIENT_CLINIC_OR_DEPARTMENT_OTHER): Payer: PPO | Admitting: Family

## 2021-08-31 DIAGNOSIS — E1169 Type 2 diabetes mellitus with other specified complication: Secondary | ICD-10-CM | POA: Diagnosis not present

## 2021-08-31 DIAGNOSIS — I10 Essential (primary) hypertension: Secondary | ICD-10-CM | POA: Diagnosis not present

## 2021-08-31 DIAGNOSIS — E7800 Pure hypercholesterolemia, unspecified: Secondary | ICD-10-CM | POA: Diagnosis not present

## 2021-08-31 DIAGNOSIS — K219 Gastro-esophageal reflux disease without esophagitis: Secondary | ICD-10-CM | POA: Diagnosis not present

## 2021-09-02 DIAGNOSIS — M47816 Spondylosis without myelopathy or radiculopathy, lumbar region: Secondary | ICD-10-CM | POA: Diagnosis not present

## 2021-09-02 DIAGNOSIS — M150 Primary generalized (osteo)arthritis: Secondary | ICD-10-CM | POA: Diagnosis not present

## 2021-09-02 DIAGNOSIS — G894 Chronic pain syndrome: Secondary | ICD-10-CM | POA: Diagnosis not present

## 2021-09-02 DIAGNOSIS — Z79899 Other long term (current) drug therapy: Secondary | ICD-10-CM | POA: Diagnosis not present

## 2021-09-09 ENCOUNTER — Other Ambulatory Visit: Payer: Self-pay

## 2021-09-09 ENCOUNTER — Ambulatory Visit (INDEPENDENT_AMBULATORY_CARE_PROVIDER_SITE_OTHER): Payer: PPO | Admitting: Family Medicine

## 2021-09-09 ENCOUNTER — Encounter (INDEPENDENT_AMBULATORY_CARE_PROVIDER_SITE_OTHER): Payer: Self-pay | Admitting: Family Medicine

## 2021-09-09 VITALS — BP 113/65 | HR 73 | Temp 97.4°F | Ht 64.0 in | Wt 188.0 lb

## 2021-09-09 DIAGNOSIS — E66812 Obesity, class 2: Secondary | ICD-10-CM

## 2021-09-09 DIAGNOSIS — Z6837 Body mass index (BMI) 37.0-37.9, adult: Secondary | ICD-10-CM

## 2021-09-09 DIAGNOSIS — E559 Vitamin D deficiency, unspecified: Secondary | ICD-10-CM | POA: Diagnosis not present

## 2021-09-09 DIAGNOSIS — F4321 Adjustment disorder with depressed mood: Secondary | ICD-10-CM | POA: Diagnosis not present

## 2021-09-09 MED ORDER — VITAMIN D (ERGOCALCIFEROL) 1.25 MG (50000 UNIT) PO CAPS: 50000.0000 [IU] | ORAL_CAPSULE | ORAL | 0 refills | Status: DC

## 2021-09-09 NOTE — Progress Notes (Signed)
Chief Complaint:   OBESITY Katelan is here to discuss her progress with her obesity treatment plan along with follow-up of her obesity related diagnoses. Estefana is on the Category 2 Plan and keeping a food journal and adhering to recommended goals of 400-600 calories and 35 grams of protein at supper and states she is following her eating plan approximately 50% of the time. Francesa states she is doing 0 minutes 0 times per week.  Today's visit was #: 10 Starting weight: 219 lbs Starting date: 04/22/2021 Today's weight: 188 lbs Today's date: 09/09/2021 Total lbs lost to date: 31 lbs Total lbs lost since last in-office visit: 4 lbs  Interim History: Skylynne's husband passed away 09-14-23 at hospice. He had Alzheimers, kidney failure, and congestive heart failure. Her appetite is decreased but she is focusing on protein intake. She is coping fairly well.  Subjective:   1. Grief Susanna is working through grief over the passing of her husband 14-Sep-2023. She is doing fairly well. Her sleep is poor. She takes comfort in her family and her dogs.   2. Vitamin D deficiency Lynnda's Vitamin D is low at 40.7. She is on weekly prescription Vitamin D.   Lab Results  Component Value Date   VD25OH 40.7 04/22/2021    Assessment/Plan:   1. Grief Erabella will discuss insomnia with her primary care provider.   2. Vitamin D deficiency  We will refill prescription Vitamin D 50,000 IU every week and Linde will follow-up for routine testing of Vitamin D, at least 2-3 times per year to avoid over-replacement.  - Vitamin D, Ergocalciferol, (DRISDOL) 1.25 MG (50000 UNIT) CAPS capsule; Take 1 capsule (50,000 Units total) by mouth every 7 (seven) days.  Dispense: 4 capsule; Refill: 0  3. Obesity with current BMI 33.1 Deziree is currently in the action stage of change. As such, her goal is to continue with weight loss efforts. She has agreed to practicing portion control and making smarter food  choices, such as increasing vegetables and decreasing simple carbohydrates at supper.  We discussed easy, higher protein meal options.  She will follow PC/[facility] during this stressful time and we will discuss going back to cat 2 plan at next visit.  Exercise goals: No exercise has been prescribed at this time.  Behavioral modification strategies: increasing lean protein intake and meal planning and cooking strategies.  Laityn has agreed to follow-up with our clinic in 5 weeks.   Objective:   Blood pressure 113/65, pulse 73, temperature (!) 97.4 F (36.3 C), height 5\' 4"  (1.626 m), weight 188 lb (85.3 kg), SpO2 95 %. Body mass index is 32.27 kg/m.  General: Cooperative, alert, well developed, in no acute distress. HEENT: Conjunctivae and lids unremarkable. Cardiovascular: Regular rhythm.  Lungs: Normal work of breathing. Neurologic: No focal deficits.   Lab Results  Component Value Date   CREATININE 0.84 07/14/2021   BUN 13 07/14/2021   NA 138 07/14/2021   K 4.1 07/14/2021   CL 97 07/14/2021   CO2 25 07/14/2021   Lab Results  Component Value Date   ALT 11 06/04/2021   AST 18 06/04/2021   ALKPHOS 47 06/04/2021   BILITOT 0.3 06/04/2021   Lab Results  Component Value Date   HGBA1C 6.7 (H) 04/22/2021   HGBA1C 8.9 (H) 11/13/2018   HGBA1C 7.7 (H) 11/25/2017   HGBA1C 7.2 (H) 08/24/2017   HGBA1C 6.7 (H) 11/16/2016   Lab Results  Component Value Date   INSULIN 22.3 04/22/2021  Lab Results  Component Value Date   TSH 1.680 04/22/2021   Lab Results  Component Value Date   CHOL 129 04/22/2021   HDL 56 04/22/2021   LDLCALC 51 04/22/2021   TRIG 125 04/22/2021   Lab Results  Component Value Date   VD25OH 40.7 04/22/2021   Lab Results  Component Value Date   WBC 7.6 06/04/2021   HGB 11.8 (L) 06/04/2021   HCT 36.9 06/04/2021   MCV 87.0 06/04/2021   PLT 275 06/04/2021   No results found for: IRON, TIBC, FERRITIN  Obesity Behavioral Intervention:    Approximately 15 minutes were spent on the discussion below.  ASK: We discussed the diagnosis of obesity with Jashayla today and Livvy agreed to give [place] permission to discuss obesity behavioral modification therapy today.  ASSESS: Lalani has the diagnosis of obesity and her BMI today is 32.4. Minette is in the action stage of change.   ADVISE: Domonic was educated on the multiple health risks of obesity as well as the benefit of weight loss to improve her health. She was advised of the need for long term treatment and the importance of lifestyle modifications to improve her current health and to decrease her risk of future health problems.  AGREE: Multiple dietary modification options and treatment options were discussed and Eliani agreed to follow the recommendations documented in the above note.  ARRANGE: Miaa was educated on the importance of frequent visits to treat obesity as outlined per CMS and USPSTF guidelines and agreed to schedule her next follow up appointment today.  Attestation Statements:   Reviewed by clinician on day of visit: allergies, medications, problem list, medical history, surgical history, family history, social history, and previous encounter notes.  I, Lizbeth Bark, RMA, am acting as Location manager for [employer], [place].   I have reviewed the above documentation for accuracy and completeness, and I agree with the above. -  Georgianne Fick, FNP

## 2021-09-21 DIAGNOSIS — E7800 Pure hypercholesterolemia, unspecified: Secondary | ICD-10-CM | POA: Diagnosis not present

## 2021-09-21 DIAGNOSIS — I10 Essential (primary) hypertension: Secondary | ICD-10-CM | POA: Diagnosis not present

## 2021-09-21 DIAGNOSIS — E1169 Type 2 diabetes mellitus with other specified complication: Secondary | ICD-10-CM | POA: Diagnosis not present

## 2021-09-21 DIAGNOSIS — E559 Vitamin D deficiency, unspecified: Secondary | ICD-10-CM | POA: Diagnosis not present

## 2021-09-29 DIAGNOSIS — K529 Noninfective gastroenteritis and colitis, unspecified: Secondary | ICD-10-CM | POA: Diagnosis not present

## 2021-09-29 DIAGNOSIS — I10 Essential (primary) hypertension: Secondary | ICD-10-CM | POA: Diagnosis not present

## 2021-09-29 DIAGNOSIS — E559 Vitamin D deficiency, unspecified: Secondary | ICD-10-CM | POA: Diagnosis not present

## 2021-09-29 DIAGNOSIS — Z0000 Encounter for general adult medical examination without abnormal findings: Secondary | ICD-10-CM | POA: Diagnosis not present

## 2021-09-29 DIAGNOSIS — E669 Obesity, unspecified: Secondary | ICD-10-CM | POA: Diagnosis not present

## 2021-09-29 DIAGNOSIS — E1169 Type 2 diabetes mellitus with other specified complication: Secondary | ICD-10-CM | POA: Diagnosis not present

## 2021-09-29 DIAGNOSIS — Z6833 Body mass index (BMI) 33.0-33.9, adult: Secondary | ICD-10-CM | POA: Diagnosis not present

## 2021-09-29 DIAGNOSIS — E7800 Pure hypercholesterolemia, unspecified: Secondary | ICD-10-CM | POA: Diagnosis not present

## 2021-09-29 DIAGNOSIS — K219 Gastro-esophageal reflux disease without esophagitis: Secondary | ICD-10-CM | POA: Diagnosis not present

## 2021-09-30 ENCOUNTER — Ambulatory Visit (INDEPENDENT_AMBULATORY_CARE_PROVIDER_SITE_OTHER): Payer: PPO | Admitting: Family Medicine

## 2021-10-12 ENCOUNTER — Other Ambulatory Visit: Payer: Self-pay

## 2021-10-12 ENCOUNTER — Ambulatory Visit (INDEPENDENT_AMBULATORY_CARE_PROVIDER_SITE_OTHER): Payer: PPO | Admitting: Family Medicine

## 2021-10-12 ENCOUNTER — Encounter (INDEPENDENT_AMBULATORY_CARE_PROVIDER_SITE_OTHER): Payer: Self-pay | Admitting: Family Medicine

## 2021-10-12 VITALS — BP 121/75 | HR 81 | Temp 98.0°F | Ht 64.0 in | Wt 188.0 lb

## 2021-10-12 DIAGNOSIS — E559 Vitamin D deficiency, unspecified: Secondary | ICD-10-CM | POA: Diagnosis not present

## 2021-10-12 DIAGNOSIS — F3289 Other specified depressive episodes: Secondary | ICD-10-CM

## 2021-10-12 DIAGNOSIS — Z6837 Body mass index (BMI) 37.0-37.9, adult: Secondary | ICD-10-CM

## 2021-10-12 MED ORDER — BUPROPION HCL ER (SR) 150 MG PO TB12: 150.0000 mg | ORAL_TABLET | Freq: Every day | ORAL | 0 refills | Status: DC

## 2021-10-12 MED ORDER — VITAMIN D (ERGOCALCIFEROL) 1.25 MG (50000 UNIT) PO CAPS: 50000.0000 [IU] | ORAL_CAPSULE | ORAL | 0 refills | Status: DC

## 2021-10-12 NOTE — Progress Notes (Signed)
Chief Complaint:   OBESITY Lauren [name] is here to discuss her progress with her obesity treatment plan along with follow-up of her obesity related diagnoses. Lauren [name] is on practicing portion control and making smarter food choices, such as increasing vegetables and decreasing simple carbohydrates and states she is following her eating plan approximately 50% of the time. Lauren [name] states she is doing 0 minutes 0 times per week.  Today's visit was #: 11 Starting weight: 219 lbs Starting date: 04/22/2021 Today's weight: 188 lbs Today's date: 10/12/2021 Total lbs lost to date: 31 Total lbs lost since last in-office visit: 0  Interim History: Lauren [name] has done well maintaining her weight over Thanksgiving. She is being mindful and she is working on portion control when she is able. She notes some increase in emotional eating.  Subjective:   1. Vitamin D deficiency Luellen is stable on Vit D, with no side effects noted.  2. Other depression with emotional eating Lauren [name] is grieving the recent loss of her husband, and she states she has good days and bad days. She notes some comfort eating which of course is expected. She is open to looking at options to help work on this.  Assessment/Plan:   1. Vitamin D deficiency Lauren [name] will continue prescription Vitamin D 50,000 IU every week, and we will refill for 1 month. She will follow-up for routine testing of Vitamin D, at least 2-3 times per year to avoid over-replacement.  - Vitamin D, Ergocalciferol, (DRISDOL) 1.25 MG (50000 UNIT) CAPS capsule; Take 1 capsule (50,000 Units total) by mouth every 7 (seven) days.  Dispense: 4 capsule; Refill: 0  2. Other depression with emotional eating Lauren [name] agreed to start Wellbutrin SR 150 mg q AM with no refills. Behavior modification techniques were discussed today to help Lauren [name] deal with her emotional/non-hunger eating behaviors. Orders and follow up as documented in patient record.   - buPROPion  (WELLBUTRIN SR) 150 MG 12 hr tablet; Take 1 tablet (150 mg total) by mouth daily.  Dispense: 30 tablet; Refill: 0  3. Obesity with current BMI 32.3 Lauren [name] is currently in the action stage of change. As such, her goal is to continue with weight loss efforts. She has agreed to the Category 2 Plan.   Behavioral modification strategies: emotional eating strategies and holiday eating strategies .  Lauren [name] has agreed to follow-up with our clinic in 4 weeks. She was informed of the importance of frequent follow-up visits to maximize her success with intensive lifestyle modifications for her multiple health conditions.   Objective:   Blood pressure 121/75, pulse 81, temperature 98 F (36.7 C), height 5\' 4"  (1.626 m), weight 188 lb (85.3 kg), SpO2 97 %. Body mass index is 32.27 kg/m.  General: Cooperative, alert, well developed, in no acute distress. HEENT: Conjunctivae and lids unremarkable. Cardiovascular: Regular rhythm.  Lungs: Normal work of breathing. Neurologic: No focal deficits.   Lab Results  Component Value Date   CREATININE 0.84 07/14/2021   BUN 13 07/14/2021   NA 138 07/14/2021   K 4.1 07/14/2021   CL 97 07/14/2021   CO2 25 07/14/2021   Lab Results  Component Value Date   ALT 11 06/04/2021   AST 18 06/04/2021   ALKPHOS 47 06/04/2021   BILITOT 0.3 06/04/2021   Lab Results  Component Value Date   HGBA1C 6.7 (H) 04/22/2021   HGBA1C 8.9 (H) 11/13/2018   HGBA1C 7.7 (H) 11/25/2017   HGBA1C 7.2 (H) 08/24/2017   HGBA1C 6.7 (H) 11/16/2016  Lab Results  Component Value Date   INSULIN 22.3 04/22/2021   Lab Results  Component Value Date   TSH 1.680 04/22/2021   Lab Results  Component Value Date   CHOL 129 04/22/2021   HDL 56 04/22/2021   LDLCALC 51 04/22/2021   TRIG 125 04/22/2021   Lab Results  Component Value Date   VD25OH 40.7 04/22/2021   Lab Results  Component Value Date   WBC 7.6 06/04/2021   HGB 11.8 (L) 06/04/2021   HCT 36.9 06/04/2021   MCV  87.0 06/04/2021   PLT 275 06/04/2021   No results found for: IRON, TIBC, FERRITIN  Obesity Behavioral Intervention:   Approximately 15 minutes were spent on the discussion below.  ASK: We discussed the diagnosis of obesity with Canna today and Ishitha agreed to give [place] permission to discuss obesity behavioral modification therapy today.  ASSESS: Lauren [name] has the diagnosis of obesity and her BMI today is 32.3. Lauren [name] is in the action stage of change.   ADVISE: Lauren [name] was educated on the multiple health risks of obesity as well as the benefit of weight loss to improve her health. She was advised of the need for long term treatment and the importance of lifestyle modifications to improve her current health and to decrease her risk of future health problems.  AGREE: Multiple dietary modification options and treatment options were discussed and Lauren [name] agreed to follow the recommendations documented in the above note.  ARRANGE: Kirstan was educated on the importance of frequent visits to treat obesity as outlined per CMS and USPSTF guidelines and agreed to schedule her next follow up appointment today.  Attestation Statements:   Reviewed by clinician on day of visit: allergies, medications, problem list, medical history, surgical history, family history, social history, and previous encounter notes.   I, Trixie [name], am acting as transcriptionist for Dennard Nip, MD.  I have reviewed the above documentation for accuracy and completeness, and I agree with the above. -  Dennard Nip, MD

## 2021-10-14 ENCOUNTER — Ambulatory Visit (INDEPENDENT_AMBULATORY_CARE_PROVIDER_SITE_OTHER): Payer: PPO | Admitting: Family Medicine

## 2021-10-21 DIAGNOSIS — M150 Primary generalized (osteo)arthritis: Secondary | ICD-10-CM | POA: Diagnosis not present

## 2021-10-21 DIAGNOSIS — M47817 Spondylosis without myelopathy or radiculopathy, lumbosacral region: Secondary | ICD-10-CM | POA: Diagnosis not present

## 2021-10-21 DIAGNOSIS — Z79899 Other long term (current) drug therapy: Secondary | ICD-10-CM | POA: Diagnosis not present

## 2021-10-21 DIAGNOSIS — G894 Chronic pain syndrome: Secondary | ICD-10-CM | POA: Diagnosis not present

## 2021-11-01 HISTORY — PX: EYE SURGERY: SHX253

## 2021-11-10 ENCOUNTER — Other Ambulatory Visit (INDEPENDENT_AMBULATORY_CARE_PROVIDER_SITE_OTHER): Payer: Self-pay | Admitting: Family Medicine

## 2021-11-10 DIAGNOSIS — H524 Presbyopia: Secondary | ICD-10-CM | POA: Diagnosis not present

## 2021-11-10 DIAGNOSIS — H2511 Age-related nuclear cataract, right eye: Secondary | ICD-10-CM | POA: Diagnosis not present

## 2021-11-10 DIAGNOSIS — H353131 Nonexudative age-related macular degeneration, bilateral, early dry stage: Secondary | ICD-10-CM | POA: Diagnosis not present

## 2021-11-10 DIAGNOSIS — E119 Type 2 diabetes mellitus without complications: Secondary | ICD-10-CM | POA: Diagnosis not present

## 2021-11-10 DIAGNOSIS — H52223 Regular astigmatism, bilateral: Secondary | ICD-10-CM | POA: Diagnosis not present

## 2021-11-10 DIAGNOSIS — H2512 Age-related nuclear cataract, left eye: Secondary | ICD-10-CM | POA: Diagnosis not present

## 2021-11-10 DIAGNOSIS — H5213 Myopia, bilateral: Secondary | ICD-10-CM | POA: Diagnosis not present

## 2021-11-10 DIAGNOSIS — F3289 Other specified depressive episodes: Secondary | ICD-10-CM

## 2021-11-10 NOTE — Telephone Encounter (Signed)
Last OV with Dr. Beasley 

## 2021-11-12 ENCOUNTER — Ambulatory Visit (INDEPENDENT_AMBULATORY_CARE_PROVIDER_SITE_OTHER): Payer: HMO | Admitting: Family Medicine

## 2021-11-12 ENCOUNTER — Other Ambulatory Visit: Payer: Self-pay

## 2021-11-12 ENCOUNTER — Encounter (INDEPENDENT_AMBULATORY_CARE_PROVIDER_SITE_OTHER): Payer: Self-pay | Admitting: Family Medicine

## 2021-11-12 VITALS — BP 121/76 | HR 69 | Temp 98.2°F | Ht 64.0 in | Wt 186.0 lb

## 2021-11-12 DIAGNOSIS — F3289 Other specified depressive episodes: Secondary | ICD-10-CM

## 2021-11-12 DIAGNOSIS — Z6832 Body mass index (BMI) 32.0-32.9, adult: Secondary | ICD-10-CM | POA: Diagnosis not present

## 2021-11-12 DIAGNOSIS — Z6837 Body mass index (BMI) 37.0-37.9, adult: Secondary | ICD-10-CM

## 2021-11-16 ENCOUNTER — Ambulatory Visit (INDEPENDENT_AMBULATORY_CARE_PROVIDER_SITE_OTHER): Payer: HMO

## 2021-11-16 ENCOUNTER — Ambulatory Visit (INDEPENDENT_AMBULATORY_CARE_PROVIDER_SITE_OTHER): Payer: HMO | Admitting: Physician Assistant

## 2021-11-16 ENCOUNTER — Other Ambulatory Visit: Payer: Self-pay

## 2021-11-16 ENCOUNTER — Encounter: Payer: Self-pay | Admitting: Physician Assistant

## 2021-11-16 DIAGNOSIS — M25511 Pain in right shoulder: Secondary | ICD-10-CM | POA: Diagnosis not present

## 2021-11-16 MED ORDER — METHYLPREDNISOLONE ACETATE 40 MG/ML IJ SUSP
40.0000 mg | INTRAMUSCULAR | Status: AC | PRN
  Administered 2021-11-16: 40 mg via INTRA_ARTICULAR

## 2021-11-16 MED ORDER — LIDOCAINE HCL 1 % IJ SOLN
3.0000 mL | INTRAMUSCULAR | Status: AC | PRN
  Administered 2021-11-16: 3 mL

## 2021-11-16 NOTE — Progress Notes (Signed)
Chief Complaint:   OBESITY Lauren [name] is here to discuss her progress with her obesity treatment plan along with follow-up of her obesity related diagnoses. Lauren [name] is on the Category 2 Plan and states she is following her eating plan approximately 70% of the time. Lauren [name] states she is doing 0 minutes 0 times per week.  Today's visit was #: 12 Starting weight: 219 lbs Starting date: 04/22/2021 Today's weight: 186 lbs Today's date: 11/12/2021 Total lbs lost to date: 33 Total lbs lost since last in-office visit: 2  Interim History: Lauren [name] continues to do well with weight loss even over the holidays. She is working on meal planning and increase protein.  Subjective:   1. Other depression Lauren [name] started Wellbutrin last week and she feels her mood is a bit better already, but she has increased muscle aches and she is waking up more often.  Assessment/Plan:   1. Other depression Lauren [name] is to increase her water intake and will continue Wellbutrin for another 2-3 weeks to see if those side effects resolve. Orders and follow up as documented in patient record.   2. Obesity with current BMI 32.1 Lauren [name] is currently in the action stage of change. As such, her goal is to continue with weight loss efforts. She has agreed to the Category 2 Plan.   Protein recipes and protein rich soup ideas were discussed today.  We will recheck fasting labs at her next visit.  Behavioral modification strategies: increasing lean protein intake and meal planning and cooking strategies.  Britanny has agreed to follow-up with our clinic in 2 to 3 weeks. She was informed of the importance of frequent follow-up visits to maximize her success with intensive lifestyle modifications for her multiple health conditions.   Objective:   Blood pressure 121/76, pulse 69, temperature 98.2 F (36.8 C), temperature source Oral, height 5\' 4"  (1.626 m), weight 186 lb (84.4 kg), SpO2 98 %. Body mass index is 31.93  kg/m.  General: Cooperative, alert, well developed, in no acute distress. HEENT: Conjunctivae and lids unremarkable. Cardiovascular: Regular rhythm.  Lungs: Normal work of breathing. Neurologic: No focal deficits.   Lab Results  Component Value Date   CREATININE 0.84 07/14/2021   BUN 13 07/14/2021   NA 138 07/14/2021   K 4.1 07/14/2021   CL 97 07/14/2021   CO2 25 07/14/2021   Lab Results  Component Value Date   ALT 11 06/04/2021   AST 18 06/04/2021   ALKPHOS 47 06/04/2021   BILITOT 0.3 06/04/2021   Lab Results  Component Value Date   HGBA1C 6.7 (H) 04/22/2021   HGBA1C 8.9 (H) 11/13/2018   HGBA1C 7.7 (H) 11/25/2017   HGBA1C 7.2 (H) 08/24/2017   HGBA1C 6.7 (H) 11/16/2016   Lab Results  Component Value Date   INSULIN 22.3 04/22/2021   Lab Results  Component Value Date   TSH 1.680 04/22/2021   Lab Results  Component Value Date   CHOL 129 04/22/2021   HDL 56 04/22/2021   LDLCALC 51 04/22/2021   TRIG 125 04/22/2021   Lab Results  Component Value Date   VD25OH 40.7 04/22/2021   Lab Results  Component Value Date   WBC 7.6 06/04/2021   HGB 11.8 (L) 06/04/2021   HCT 36.9 06/04/2021   MCV 87.0 06/04/2021   PLT 275 06/04/2021   No results found for: IRON, TIBC, FERRITIN  Attestation Statements:   Reviewed by clinician on day of visit: allergies, medications, problem list, medical history, surgical history, family  history, social history, and previous encounter notes.   I, Trixie [name], am acting as transcriptionist for Dennard Nip, MD.  I have reviewed the above documentation for accuracy and completeness, and I agree with the above. -  Dennard Nip, MD

## 2021-11-16 NOTE — Progress Notes (Signed)
Office Visit Note   Patient: Lauren [name]           Date of Birth: 1946-08-18           MRN: [id number] Visit Date: 11/16/2021              Requested by: Lauren [name], Lauren [name],  Lauren [name] [id number] PCP: Lauren Cruel, MD   Assessment & Plan: Visit Diagnoses:  1. Acute pain of right shoulder     Plan: Patient shown forward flexion exercises, pendulum and Codman and wall crawls which she will perform on her own.  She tolerated the right shoulder injection well.  We will see her back in 2 weeks see how she is doing if she is doing well in 2 weeks she can call and cancel the appointment.  Questions were encouraged and answered at length.  Follow-Up Instructions: Return in about 2 weeks (around 11/30/2021).   Orders:  Orders Placed This Encounter  Procedures   Large Joint Inj   XR Shoulder Right   No orders of the defined types were placed in this encounter.     Procedures: Large Joint Inj: R subacromial bursa on 11/16/2021 5:27 PM Indications: pain Details: 22 G 1.5 in needle, lateral approach  Arthrogram: No  Medications: 3 mL lidocaine 1 %; 40 mg methylPREDNISolone acetate 40 MG/ML Outcome: tolerated well, no immediate complications Procedure, treatment alternatives, risks and benefits explained, specific risks discussed. Consent was given by the patient. Immediately prior to procedure a time out was called to verify the correct patient, procedure, equipment, support staff and site/side marked as required. Patient was prepped and draped in the usual sterile fashion.      Clinical Data: No additional findings.   Subjective: Chief Complaint  Patient presents with   Right Shoulder - Pain    HPI Lauren [name] 76 year old female well-known to Lauren [name] service comes in today with right shoulder pain has been ongoing for less than 2 weeks.  She states she heard a pop when reaching back to get her phone.  She is having pain mostly the  lateral aspect of her shoulder but does radiate to the elbow no numbness tingling.  She notes that she still has pain with extreme internal rotation of the right shoulder.  Ranks her pain to be 4-5 out out of 10 at worst.  She is diabetic reports last hemoglobin A1c was 6.1. Review of Systems Negative for fevers or chills.  Objective: Vital Signs: There were no vitals taken for this visit.  Physical Exam General well-developed well-nourished pleasant female no acute distress mood and affect appropriate Ortho Exam Bilateral shoulder she has 5 out of 5 strength with external and internal rotation against resistance.  Full active overhead activity both shoulders.  Empty can test is negative bilaterally.  Impingement testing positive on the right negative on the left. Specialty Comments:  No specialty comments available.  Imaging: XR Shoulder Right  Result Date: 11/16/2021 Right shoulder 3 views: Shoulder well located.  Glenohumeral joint well-maintained.  No acute fractures bony abnormalities.    PMFS History: Patient Active Problem List   Diagnosis Date Noted   Atypical chest pain 06/25/2021   Class 2 severe obesity with serious comorbidity and body mass index (BMI) of 37.0 to 37.9 in adult Lauren [name]) 06/03/2021   Vitamin D deficiency 06/03/2021   Other fatigue 04/22/2021   SOB (shortness of breath) on exertion 04/22/2021   Lower extremity edema 03/05/2021  Coronary artery calcification 02/20/2020   Pure hypercholesterolemia 02/20/2020   Unilateral primary osteoarthritis, right knee 01/03/2019   Genetic testing 12/07/2018   Family history of breast cancer    Family history of prostate cancer    Family history of colon cancer    Family history of kidney cancer    Malignant neoplasm of upper-outer quadrant of right breast in female, estrogen receptor positive (Lauren [name]) 11/09/2018   Diabetic neuropathy, type II diabetes mellitus ([place]) 11/09/2018   Status post total left knee replacement  12/02/2017   Trochanteric bursitis, right hip 05/10/2017   Primary osteoarthritis of left knee 03/09/2017   Unilateral primary osteoarthritis, right hip 11/26/2016   Status post total replacement of right hip 11/26/2016   DDD (degenerative disc disease), 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   Low back pain 04/23/2015   Hiatal hernia 04/13/2013   Diabetes mellitus ([place]) 01/77/9390   Umbilical hernia 30/07/2329   Essential hypertension    Past Medical History:  Diagnosis Date   Anxiety    Arthritis    Back, Hip- right   Atypical chest pain 06/25/2021   Back pain    Cancer ([facility])    right breast   Cataract    Colitis    Coronary artery calcification 02/20/2020   Diabetes mellitus without complication ([facility])    Type II   Edema 03/05/2021   Edema of both lower extremities    Endometriosis    Family history of adverse reaction to anesthesia    Father - N/V - blood pressure; daughter N/V   Family history of breast cancer    Family history of colon cancer    Family history of kidney cancer    Family history of prostate cancer    Fatty liver    GERD (gastroesophageal reflux disease)    History of kidney stones    History of ulcerative colitis    Hypertension    Insomnia    Joint pain    Kidney problem    Lower extremity edema 03/05/2021   Lumbar disc disease    L4 L5   Neuropathy    Osteoarthritis    Other hyperlipidemia    Palpitations    Personal history of radiation therapy    Pneumonia    1983, 2003   Pure hypercholesterolemia 02/20/2020   PVCs (premature ventricular contractions)    benign   Umbilical hernia    Vitamin D deficiency    Wears glasses     Family History  Problem Relation Age of Onset   Hyperlipidemia Mother    Diabetes Mother    Hypertension Mother    Parkinson's disease Mother    Kidney disease Mother    Kidney disease Father    Stroke Father    Heart failure Father    Hypertension Father    Heart disease Father    Diabetes Father    Cancer Father         colon, prostate, and kidney   Heart disease Maternal Grandmother    Diabetes Maternal Grandmother    Heart disease Maternal Grandfather    Diabetes Maternal Grandfather    Cancer Maternal Grandfather        pt unaware of what kind   Stroke Paternal Grandmother    Stroke Paternal Grandfather    Cancer Maternal Aunt        cervical vs ovarian   Colon cancer Paternal Aunt        dx less than 46   Breast cancer Paternal Aunt  dx in her 46s   Prostate cancer Paternal Uncle    Breast cancer Cousin        pat first cousin, dx over 70   Prostate cancer Other        PGF's father    Past Surgical History:  Procedure Laterality Date   APPENDECTOMY     BREAST BIOPSY  08-13-2009  DR TSUEI   EXCISION LEFT NIPPLE DUCT   BREAST EXCISIONAL BIOPSY Left    x2   BREAST EXCISIONAL BIOPSY Right    BREAST LUMPECTOMY Right 11/16/2018   invasive ductal   CHOLECYSTECTOMY  1992   COLON RESECTION  1986   ULCERATIVE COLITIS   COLON SURGERY     ESOPHAGEAL MANOMETRY N/A 06/11/2013   Procedure: ESOPHAGEAL MANOMETRY (EM);  Surgeon: Jeryl Columbia, MD;  Location: [facility];  Service: Endoscopy;  Laterality: N/A;   ESOPHAGOGASTRODUODENOSCOPY (EGD) WITH PROPOFOL N/A 05/31/2016   Procedure: ESOPHAGOGASTRODUODENOSCOPY (EGD) WITH PROPOFOL;  Surgeon: Clarene Essex, MD;  Location: [facility];  Service: Endoscopy;  Laterality: N/A;   EXCISION RIGHT BREAST MASS   10-20-2005  DR Collier Salina YOUNG   FRACTURE SURGERY     left arm   kidney stone removed  2/11   KNEE ARTHROSCOPY Left    MCL   LEFT THUMB CARPOMETACARPAL JOINT SUSPENSIONPLASTY  04-06-2006  DR Burney Gauze   LEFT WRIST ARTHROSCOPY W/ DEBRIDEMENT AND REMOVAL CYST  03-26-2004  DR Burney Gauze   MYOMECTOMY     RADIOACTIVE SEED GUIDED PARTIAL MASTECTOMY WITH AXILLARY SENTINEL LYMPH NODE BIOPSY Right 11/16/2018   Procedure: RIGHT BREAST RADIOACTIVE SEED GUIDED PARTIAL MASTECTOMY WITH  SENTINEL  NODE BIOPSY;  Surgeon: Coralie Keens, MD;  Location: [place];   Service: General;  Laterality: Right;   RIGHT COLECTOMY     RIGHT URETEROSCOPIC STONE EXTRACTION  12-11-2009  DR Thayne HIP ARTHROPLASTY Right 11/26/2016   Procedure: RIGHT TOTAL HIP ARTHROPLASTY ANTERIOR APPROACH;  Surgeon: Mcarthur Rossetti, MD;  Location: [facility];  Service: Orthopedics;  Laterality: Right;   TOTAL KNEE ARTHROPLASTY Left 12/02/2017   Procedure: LEFT TOTAL KNEE ARTHROPLASTY;  Surgeon: Mcarthur Rossetti, MD;  Location: [facility];  Service: Orthopedics;  Laterality: Left;   WRIST SURGERY     both   Social History   Occupational History   Occupation: Retired  Tobacco Use   Smoking status: Former    Packs/day: 1.00    Years: 10.00    Pack years: 10.00    Types: Cigarettes    Quit date: 09/22/1982    Years since quitting: 39.1   Smokeless tobacco: Never   Tobacco comments:    quit 1983  Vaping Use   Vaping Use: Never used  Substance and Sexual Activity   Alcohol use: No   Drug use: No   Sexual activity: Yes    Partners: Male    Birth control/protection: Post-menopausal

## 2021-11-18 ENCOUNTER — Other Ambulatory Visit: Payer: Self-pay | Admitting: Family Medicine

## 2021-11-18 DIAGNOSIS — G894 Chronic pain syndrome: Secondary | ICD-10-CM | POA: Diagnosis not present

## 2021-11-18 DIAGNOSIS — M47817 Spondylosis without myelopathy or radiculopathy, lumbosacral region: Secondary | ICD-10-CM | POA: Diagnosis not present

## 2021-11-18 DIAGNOSIS — Z79899 Other long term (current) drug therapy: Secondary | ICD-10-CM | POA: Diagnosis not present

## 2021-11-18 DIAGNOSIS — Z1231 Encounter for screening mammogram for malignant neoplasm of breast: Secondary | ICD-10-CM

## 2021-11-18 DIAGNOSIS — M150 Primary generalized (osteo)arthritis: Secondary | ICD-10-CM | POA: Diagnosis not present

## 2021-11-24 ENCOUNTER — Ambulatory Visit: Payer: HMO | Admitting: Surgical

## 2021-11-24 ENCOUNTER — Encounter: Payer: Self-pay | Admitting: Surgical

## 2021-11-24 ENCOUNTER — Other Ambulatory Visit: Payer: Self-pay

## 2021-11-24 VITALS — BP 128/65 | HR 76 | Ht 64.0 in | Wt 188.0 lb

## 2021-11-24 DIAGNOSIS — Z719 Counseling, unspecified: Secondary | ICD-10-CM

## 2021-11-24 DIAGNOSIS — Z411 Encounter for cosmetic surgery: Secondary | ICD-10-CM

## 2021-11-24 NOTE — Progress Notes (Signed)
Referring Provider Lawerance Cruel, MD [place],  [place] [id number]   CC:  Chief Complaint  Patient presents with   consult      Lauren [name] is an 76 y.o. female.  HPI: Patient is a 76 year old female here for consultation for facial aging and photoaging.  She reports that she is mostly bothered by darkening spots on her cheeks as well as some spider veins she notices on her anterior cheek.  She reports that she has previously had the spider veins in her face injected at Dr. Sueanne Margarita office.  She has not had any laser therapy before.  She reports no history of cold sores or HSV.  She reports she previously had a lesion on her nose biopsied/excised in the past, she is unsure if this was skin cancer or not.  No other known skin cancer.  Allergies  Allergen Reactions   Ciprofloxacin Hcl Hives   Percocet [Oxycodone-Acetaminophen] Other (See Comments)    RESPIRATORY DISTRESS   Penicillins Hives and Other (See Comments)    Has patient had a PCN reaction causing immediate rash, facial/tongue/throat swelling, SOB or lightheadedness with hypotension: no Has patient had a PCN reaction causing severe rash involving mucus membranes or skin necrosis: no Has patient had a PCN reaction that required hospitalization no Has patient had a PCN reaction occurring within the last 10 years: no If all of the above answers are "NO", then may proceed with Cephalosporin use.   Sulfa Antibiotics Hives   Tape Other (See Comments)    Adhesives - Causes Blisters. No band aids, and plastic tape    Tetanus Toxoids Hives and Rash   Jardiance [Empagliflozin]     Outpatient Encounter Medications as of 11/24/2021  Medication Sig   aspirin EC 81 MG tablet Take 81 mg by mouth daily.   b complex vitamins tablet Take 1 tablet by mouth daily.   balsalazide (COLAZAL) 750 MG capsule Take 2,250 mg by mouth 3 (three) times daily.    buPROPion (WELLBUTRIN SR) 150 MG 12 hr tablet Take 1 tablet  (150 mg total) by mouth daily.   Cholecalciferol (VITAMIN D3) 125 MCG (5000 UT) CAPS 1 capsule   cholestyramine (QUESTRAN) 4 GM/DOSE powder Take 4 g by mouth daily.    gabapentin (NEURONTIN) 300 MG capsule Take 600 mg by mouth at bedtime.    HYDROcodone-acetaminophen (NORCO) 10-325 MG tablet Take by mouth.   Lancets (ONETOUCH DELICA PLUS [id number]) MISC    lidocaine (LMX) 4 % cream Apply 1 application topically daily as needed (for joint pain).    metFORMIN (GLUCOPHAGE-XR) 500 MG 24 hr tablet Take 500 mg by mouth 2 (two) times daily.   methocarbamol (ROBAXIN) 500 MG tablet Take 1 tablet (500 mg total) by mouth every 6 (six) hours as needed for muscle spasms. (Patient taking differently: Take 500 mg by mouth at bedtime.)   ONETOUCH VERIO test strip    Oxymetazoline HCl (NASAL SPRAY NA) Place 1-2 sprays into both nostrils at bedtime as needed (for allergies).   pantoprazole (PROTONIX) 40 MG tablet Take 40 mg by mouth 2 (two) times daily.   pioglitazone (ACTOS) 15 MG tablet Take 45 mg by mouth daily.   quinapril (ACCUPRIL) 40 MG tablet Take 40 mg by mouth daily.   rosuvastatin (CRESTOR) 20 MG tablet TAKE 1 TABLET BY MOUTH EVERY DAY   Vitamin D, Ergocalciferol, (DRISDOL) 1.25 MG (50000 UNIT) CAPS capsule Take 1 capsule (50,000 Units total) by mouth every 7 (seven)  days.   chlorthalidone (HYGROTON) 25 MG tablet Take 1 tablet (25 mg total) by mouth daily.   No facility-administered encounter medications on file as of 11/24/2021.     Past Medical History:  Diagnosis Date   Anxiety    Arthritis    Back, Hip- right   Atypical chest pain 06/25/2021   Back pain    Cancer ([place])    right breast   Cataract    Colitis    Coronary artery calcification 02/20/2020   Diabetes mellitus without complication (HCC)    Type II   Edema 03/05/2021   Edema of both lower extremities    Endometriosis    Family history of adverse reaction to anesthesia    Father - N/V - blood pressure; daughter N/V   Family  history of breast cancer    Family history of colon cancer    Family history of kidney cancer    Family history of prostate cancer    Fatty liver    GERD (gastroesophageal reflux disease)    History of kidney stones    History of ulcerative colitis    Hypertension    Insomnia    Joint pain    Kidney problem    Lower extremity edema 03/05/2021   Lumbar disc disease    L4 L5   Neuropathy    Osteoarthritis    Other hyperlipidemia    Palpitations    Personal history of radiation therapy    Pneumonia    1983, 2003   Pure hypercholesterolemia 02/20/2020   PVCs (premature ventricular contractions)    benign   Umbilical hernia    Vitamin D deficiency    Wears glasses     Past Surgical History:  Procedure Laterality Date   APPENDECTOMY     BREAST BIOPSY  08-13-2009  DR TSUEI   EXCISION LEFT NIPPLE DUCT   BREAST EXCISIONAL BIOPSY Left    x2   BREAST EXCISIONAL BIOPSY Right    BREAST LUMPECTOMY Right 11/16/2018   invasive ductal   CHOLECYSTECTOMY  1992   COLON RESECTION  1986   ULCERATIVE COLITIS   COLON SURGERY     ESOPHAGEAL MANOMETRY N/A 06/11/2013   Procedure: ESOPHAGEAL MANOMETRY (EM);  Surgeon: Jeryl Columbia, MD;  Location: [facility];  Service: Endoscopy;  Laterality: N/A;   ESOPHAGOGASTRODUODENOSCOPY (EGD) WITH PROPOFOL N/A 05/31/2016   Procedure: ESOPHAGOGASTRODUODENOSCOPY (EGD) WITH PROPOFOL;  Surgeon: Clarene Essex, MD;  Location: [facility];  Service: Endoscopy;  Laterality: N/A;   EXCISION RIGHT BREAST MASS   10-20-2005  DR Collier Salina YOUNG   FRACTURE SURGERY     left arm   kidney stone removed  2/11   KNEE ARTHROSCOPY Left    MCL   LEFT THUMB CARPOMETACARPAL JOINT SUSPENSIONPLASTY  04-06-2006  DR Burney Gauze   LEFT WRIST ARTHROSCOPY W/ DEBRIDEMENT AND REMOVAL CYST  03-26-2004  DR Burney Gauze   MYOMECTOMY     RADIOACTIVE SEED GUIDED PARTIAL MASTECTOMY WITH AXILLARY SENTINEL LYMPH NODE BIOPSY Right 11/16/2018   Procedure: RIGHT BREAST RADIOACTIVE SEED GUIDED PARTIAL  MASTECTOMY WITH  SENTINEL  NODE BIOPSY;  Surgeon: Coralie Keens, MD;  Location: [place];  Service: General;  Laterality: Right;   RIGHT COLECTOMY     RIGHT URETEROSCOPIC STONE EXTRACTION  12-11-2009  DR Gillespie     TOTAL HIP ARTHROPLASTY Right 11/26/2016   Procedure: RIGHT TOTAL HIP ARTHROPLASTY ANTERIOR APPROACH;  Surgeon: Mcarthur Rossetti, MD;  Location: [facility];  Service: Orthopedics;  Laterality: Right;   TOTAL KNEE ARTHROPLASTY Left 12/02/2017   Procedure: LEFT TOTAL KNEE ARTHROPLASTY;  Surgeon: Mcarthur Rossetti, MD;  Location: [facility];  Service: Orthopedics;  Laterality: Left;   WRIST SURGERY     both    Family History  Problem Relation Age of Onset   Hyperlipidemia Mother    Diabetes Mother    Hypertension Mother    Parkinson's disease Mother    Kidney disease Mother    Kidney disease Father    Stroke Father    Heart failure Father    Hypertension Father    Heart disease Father    Diabetes Father    Cancer Father        colon, prostate, and kidney   Heart disease Maternal Grandmother    Diabetes Maternal Grandmother    Heart disease Maternal Grandfather    Diabetes Maternal Grandfather    Cancer Maternal Grandfather        pt unaware of what kind   Stroke Paternal Grandmother    Stroke Paternal Grandfather    Cancer Maternal Aunt        cervical vs ovarian   Colon cancer Paternal Aunt        dx less than 78   Breast cancer Paternal Aunt        dx in her 17s   Prostate cancer Paternal Uncle    Breast cancer Cousin        pat first cousin, dx over 86   Prostate cancer Other        PGF's father    Social History   Social History Narrative   Epworth Sleepiness Scale = 4 (as of 09/12/15)     Physical Exam Vitals with BMI 11/24/2021 11/12/2021 10/12/2021  Height 5\' 4"  5\' 4"  5\' 4"   Weight 188 lbs 186 lbs 188 lbs  BMI 32.25 33.83 29.19  Systolic 166 060 045  Diastolic 65 76 75  Pulse 76 69 81    General:  No  acute distress,  Alert and oriented, Non-Toxic, Normal speech and affect HEENT: Photoaging noted over bilateral cheeks, she has some fine lines and wrinkles noted in the lateral canthus region, nasolabial fold and jowl region.  seborrheic keratoses noted throughout forehead.  Assessment/Plan 76 year old female interested in options for improving appearance of photoaging.  We discussed IPL/BBL laser.  We also discussed halo laser.  Given her main concern is photoaging, discussed with patient that BBL would be the best option for her at this time.  We discussed the risks associated with laser including burns, hypopigmentation.  Patient was understanding of this.  She would like to schedule this.  Pictures were obtained of the patient and placed in the chart with the patient's or guardian's permission.   Carola Rhine Zikeria Keough 11/24/2021, 12:10 PM

## 2021-11-26 DIAGNOSIS — R42 Dizziness and giddiness: Secondary | ICD-10-CM | POA: Diagnosis not present

## 2021-11-27 ENCOUNTER — Ambulatory Visit
Admission: RE | Admit: 2021-11-27 | Discharge: 2021-11-27 | Disposition: A | Discharge status: Home / Self Care | Payer: HMO | Source: Ambulatory Visit | Attending: Family Medicine | Admitting: Family Medicine

## 2021-11-27 ENCOUNTER — Other Ambulatory Visit: Payer: Self-pay | Admitting: Family Medicine

## 2021-11-27 DIAGNOSIS — R42 Dizziness and giddiness: Secondary | ICD-10-CM | POA: Diagnosis not present

## 2021-11-30 ENCOUNTER — Ambulatory Visit: Payer: HMO | Admitting: Physician Assistant

## 2021-12-10 ENCOUNTER — Ambulatory Visit (INDEPENDENT_AMBULATORY_CARE_PROVIDER_SITE_OTHER): Payer: HMO | Admitting: Family Medicine

## 2021-12-10 ENCOUNTER — Other Ambulatory Visit: Payer: Self-pay

## 2021-12-10 ENCOUNTER — Encounter (INDEPENDENT_AMBULATORY_CARE_PROVIDER_SITE_OTHER): Payer: Self-pay | Admitting: Family Medicine

## 2021-12-10 VITALS — BP 110/69 | HR 65 | Temp 98.2°F | Ht 64.0 in | Wt 186.0 lb

## 2021-12-10 DIAGNOSIS — E669 Obesity, unspecified: Secondary | ICD-10-CM | POA: Diagnosis not present

## 2021-12-10 DIAGNOSIS — Z6832 Body mass index (BMI) 32.0-32.9, adult: Secondary | ICD-10-CM

## 2021-12-10 DIAGNOSIS — E7800 Pure hypercholesterolemia, unspecified: Secondary | ICD-10-CM | POA: Diagnosis not present

## 2021-12-10 DIAGNOSIS — I10 Essential (primary) hypertension: Secondary | ICD-10-CM

## 2021-12-10 DIAGNOSIS — E559 Vitamin D deficiency, unspecified: Secondary | ICD-10-CM | POA: Diagnosis not present

## 2021-12-10 DIAGNOSIS — E7849 Other hyperlipidemia: Secondary | ICD-10-CM

## 2021-12-10 DIAGNOSIS — E1169 Type 2 diabetes mellitus with other specified complication: Secondary | ICD-10-CM

## 2021-12-10 MED ORDER — VITAMIN D (ERGOCALCIFEROL) 1.25 MG (50000 UNIT) PO CAPS: 50000.0000 [IU] | ORAL_CAPSULE | ORAL | 0 refills | Status: DC

## 2021-12-10 NOTE — Progress Notes (Signed)
Chief Complaint:   OBESITY Lauren [name] is here to discuss her progress with her obesity treatment plan along with follow-up of her obesity related diagnoses. Lauren [name] is on the Category 2 Plan and states she is following her eating plan approximately 70% of the time. Lauren [name] states she is doing 0 minutes 0 times per week.  Today's visit was #: 69 Starting weight: 219 lbs Starting date: 04/22/2021 Today's weight: 186 lbs Today's date: 12/10/2021 Total lbs lost to date: 33 Total lbs lost since last in-office visit: 0  Interim History: Lauren [name] has done well with maintaining her weight. She continues to work on her diet, and she is trying to increase exercise. She struggles with emotional eating behaviors, and was unable to tolerate Wellbutrin.  Subjective:   1. Essential hypertension Lauren [name]'s blood pressure is on medications, and she has felt lightheaded while on Wellbutrin but this has resolved.   2. Vitamin D deficiency Lauren [name] stable on Vit D, and she is due for labs. She is due for labs.   3. Type 2 diabetes mellitus with other specified complication, without long-term current use of insulin (HCC) Lauren [name] is working on diet, exercise, and weight loss. She is due for labs.  4. Other hyperlipidemia Lauren [name] on Crestor and has decreased cholesterol in her diet. She denies chest pain.   Assessment/Plan:   1. Essential hypertension Lauren [name] will continue working on healthy weight loss and exercise to improve blood pressure control. We will check labs today.  - CMP14+EGFR  2. Vitamin D deficiency Low Vitamin D level contributes to fatigue and are associated with obesity, breast, and colon cancer. We will check labs today, and we will refill prescription Vitamin D for 1 month. Lauren [name] will follow-up for routine testing of Vitamin D, at least 2-3 times per year to avoid over-replacement.  - Vitamin D, Ergocalciferol, (DRISDOL) 1.25 MG (50000 UNIT) CAPS capsule; Take 1 capsule (50,000  Units total) by mouth every 7 (seven) days.  Dispense: 4 capsule; Refill: 0 - VITAMIN D 25 Hydroxy (Vit-D Deficiency, Fractures)  3. Type 2 diabetes mellitus with other specified complication, without long-term current use of insulin (HCC) We will check labs today. Good blood sugar control is important to decrease the likelihood of diabetic complications such as nephropathy, neuropathy, limb loss, blindness, coronary artery disease, and death. Intensive lifestyle modification including diet, exercise and weight loss are the first line of treatment for diabetes.   - Insulin, random - Hemoglobin A1c  4. Other hyperlipidemia Cardiovascular risk and specific lipid/LDL goals reviewed. We discussed several lifestyle modifications today. We will check labs today. Lauren [name] will continue to work on diet, exercise and weight loss efforts. Orders and follow up as documented in patient record.   - Lipid Panel With LDL/HDL Ratio  5. Obesity with current BMI 32.0 Lauren [name] is currently in the action stage of change. As such, her goal is to continue with weight loss efforts. She has agreed to the Category 2 Plan.   Exercise goals: Add strengthening exercise.  Behavioral modification strategies: increasing lean protein intake and meal planning and cooking strategies.  Lauren [name] has agreed to follow-up with our clinic in 4 weeks. She was informed of the importance of frequent follow-up visits to maximize her success with intensive lifestyle modifications for her multiple health conditions.   Lauren [name] was informed we would discuss her lab results at her next visit unless there is a critical issue that needs to be addressed sooner. Lauren [name] agreed to keep her next visit at  the agreed upon time to discuss these results.  Objective:   Blood pressure 110/69, pulse 65, temperature 98.2 F (36.8 C), height 5' 4"  (1.626 m), weight 186 lb (84.4 kg), SpO2 99 %. Body mass index is 31.93 kg/m.  General: Cooperative,  alert, well developed, in no acute distress. HEENT: Conjunctivae and lids unremarkable. Cardiovascular: Regular rhythm.  Lungs: Normal work of breathing. Neurologic: No focal deficits.   Lab Results  Component Value Date   CREATININE 0.84 07/14/2021   BUN 13 07/14/2021   NA 138 07/14/2021   K 4.1 07/14/2021   CL 97 07/14/2021   CO2 25 07/14/2021   Lab Results  Component Value Date   ALT 11 06/04/2021   AST 18 06/04/2021   ALKPHOS 47 06/04/2021   BILITOT 0.3 06/04/2021   Lab Results  Component Value Date   HGBA1C 6.7 (H) 04/22/2021   HGBA1C 8.9 (H) 11/13/2018   HGBA1C 7.7 (H) 11/25/2017   HGBA1C 7.2 (H) 08/24/2017   HGBA1C 6.7 (H) 11/16/2016   Lab Results  Component Value Date   INSULIN 22.3 04/22/2021   Lab Results  Component Value Date   TSH 1.680 04/22/2021   Lab Results  Component Value Date   CHOL 129 04/22/2021   HDL 56 04/22/2021   LDLCALC 51 04/22/2021   TRIG 125 04/22/2021   Lab Results  Component Value Date   VD25OH 40.7 04/22/2021   Lab Results  Component Value Date   WBC 7.6 06/04/2021   HGB 11.8 (L) 06/04/2021   HCT 36.9 06/04/2021   MCV 87.0 06/04/2021   PLT 275 06/04/2021   No results found for: IRON, TIBC, FERRITIN  Obesity Behavioral Intervention:   Approximately 15 minutes were spent on the discussion below.  ASK: We discussed the diagnosis of obesity with [name] today and Lauren [name] agreed to give [place] permission to discuss obesity behavioral modification therapy today.  ASSESS: Lauren [name] has the diagnosis of obesity and her BMI today is 32.0. Lauren [name] is in the action stage of change.   ADVISE: Lauren [name] was educated on the multiple health risks of obesity as well as the benefit of weight loss to improve her health. She was advised of the need for long term treatment and the importance of lifestyle modifications to improve her current health and to decrease her risk of future health problems.  AGREE: Multiple dietary modification  options and treatment options were discussed and Lauren [name] agreed to follow the recommendations documented in the above note.  ARRANGE: Lauren [name] was educated on the importance of frequent visits to treat obesity as outlined per CMS and USPSTF guidelines and agreed to schedule her next follow up appointment today.  Attestation Statements:   Reviewed by clinician on day of visit: allergies, medications, problem list, medical history, surgical history, family history, social history, and previous encounter notes.   I, Trixie [name], am acting as transcriptionist for Dennard Nip, MD.  I have reviewed the above documentation for accuracy and completeness, and I agree with the above. -  Dennard Nip, MD

## 2021-12-11 LAB — LIPID PANEL WITH LDL/HDL RATIO
Cholesterol, Total: 135 mg/dL (ref 100–199)
HDL: 60 mg/dL (ref >39)
LDL Chol Calc (NIH): 59 mg/dL (ref 0–99)
LDL/HDL Ratio: 1 ratio (ref 0.0–3.2)
Triglycerides: 84 mg/dL (ref 0–149)
VLDL Cholesterol Cal: 16 mg/dL (ref 5–40)

## 2021-12-11 LAB — CMP14+EGFR
ALT: 9 IU/L (ref 0–32)
AST: 14 IU/L (ref 0–40)
Albumin/Globulin Ratio: 2.1 (ref 1.2–2.2)
Albumin: 4.5 g/dL (ref 3.7–4.7)
Alkaline Phosphatase: 45 IU/L (ref 44–121)
BUN/Creatinine Ratio: 19 (ref 12–28)
BUN: 16 mg/dL (ref 8–27)
Bilirubin Total: 0.2 mg/dL (ref 0.0–1.2)
CO2: 25 mmol/L (ref 20–29)
Calcium: 9.6 mg/dL (ref 8.7–10.3)
Chloride: 95 mmol/L — ABNORMAL LOW (ref 96–106)
Creatinine, Ser: 0.86 mg/dL (ref 0.57–1.00)
Globulin, Total: 2.1 g/dL (ref 1.5–4.5)
Glucose: 79 mg/dL (ref 70–99)
Potassium: 4.9 mmol/L (ref 3.5–5.2)
Sodium: 135 mmol/L (ref 134–144)
Total Protein: 6.6 g/dL (ref 6.0–8.5)
eGFR: 70 mL/min/{1.73_m2} (ref >59)

## 2021-12-11 LAB — VITAMIN D 25 HYDROXY (VIT D DEFICIENCY, FRACTURES): Vit D, 25-Hydroxy: 86.9 ng/mL (ref 30.0–100.0)

## 2021-12-11 LAB — HEMOGLOBIN A1C
Est. average glucose Bld gHb Est-mCnc: 123 mg/dL
Hgb A1c MFr Bld: 5.9 % — ABNORMAL HIGH (ref 4.8–5.6)

## 2021-12-11 LAB — INSULIN, RANDOM: INSULIN: 13.3 u[IU]/mL (ref 2.6–24.9)

## 2021-12-15 DIAGNOSIS — G894 Chronic pain syndrome: Secondary | ICD-10-CM | POA: Diagnosis not present

## 2021-12-15 DIAGNOSIS — M150 Primary generalized (osteo)arthritis: Secondary | ICD-10-CM | POA: Diagnosis not present

## 2021-12-15 DIAGNOSIS — Z79899 Other long term (current) drug therapy: Secondary | ICD-10-CM | POA: Diagnosis not present

## 2021-12-15 DIAGNOSIS — M47817 Spondylosis without myelopathy or radiculopathy, lumbosacral region: Secondary | ICD-10-CM | POA: Diagnosis not present

## 2021-12-18 ENCOUNTER — Other Ambulatory Visit: Payer: Self-pay | Admitting: Anesthesiology

## 2021-12-18 ENCOUNTER — Ambulatory Visit
Admission: RE | Admit: 2021-12-18 | Discharge: 2021-12-18 | Disposition: A | Discharge status: Home / Self Care | Payer: HMO | Source: Ambulatory Visit | Attending: Anesthesiology | Admitting: Anesthesiology

## 2021-12-18 DIAGNOSIS — M4726 Other spondylosis with radiculopathy, lumbar region: Secondary | ICD-10-CM | POA: Diagnosis not present

## 2021-12-18 DIAGNOSIS — M48061 Spinal stenosis, lumbar region without neurogenic claudication: Secondary | ICD-10-CM | POA: Diagnosis not present

## 2021-12-18 DIAGNOSIS — M545 Low back pain, unspecified: Secondary | ICD-10-CM

## 2021-12-18 DIAGNOSIS — M79605 Pain in left leg: Secondary | ICD-10-CM

## 2021-12-25 ENCOUNTER — Ambulatory Visit (INDEPENDENT_AMBULATORY_CARE_PROVIDER_SITE_OTHER): Payer: Self-pay | Admitting: Surgical

## 2021-12-25 ENCOUNTER — Other Ambulatory Visit: Payer: Self-pay

## 2021-12-25 DIAGNOSIS — Z411 Encounter for cosmetic surgery: Secondary | ICD-10-CM

## 2021-12-25 NOTE — Progress Notes (Signed)
Preoperative Dx: Hyperpigmentation and facial telangiectasias, hyperpigmentation to bilateral hands  Postoperative Dx:  same  Procedure: laser to face and hands  Anesthesia: none  Description of Procedure:  Risks and complications were explained to the patient. Consent was confirmed and signed. Time out was called and all information was confirmed to be correct. The area  area was prepped with alcohol and wiped dry. The 560 nm laser was set at 7 J/cm, the face was lasered with 2 passes, bilateral hands were lasered with 2 passes.  The 515 nm laser was set at 7 J/cm, the face was lasered with 2 passes, bilateral hands were lasered with 2 passes. The patient tolerated the procedure well and there were no complications. The patient is to follow up in 4 weeks.

## 2021-12-29 DIAGNOSIS — M79603 Pain in arm, unspecified: Secondary | ICD-10-CM | POA: Diagnosis not present

## 2021-12-29 DIAGNOSIS — M5412 Radiculopathy, cervical region: Secondary | ICD-10-CM | POA: Diagnosis not present

## 2021-12-29 DIAGNOSIS — E1169 Type 2 diabetes mellitus with other specified complication: Secondary | ICD-10-CM | POA: Diagnosis not present

## 2021-12-29 DIAGNOSIS — M25511 Pain in right shoulder: Secondary | ICD-10-CM | POA: Diagnosis not present

## 2021-12-29 DIAGNOSIS — I10 Essential (primary) hypertension: Secondary | ICD-10-CM | POA: Diagnosis not present

## 2021-12-29 DIAGNOSIS — E7800 Pure hypercholesterolemia, unspecified: Secondary | ICD-10-CM | POA: Diagnosis not present

## 2021-12-30 DIAGNOSIS — M47817 Spondylosis without myelopathy or radiculopathy, lumbosacral region: Secondary | ICD-10-CM | POA: Diagnosis not present

## 2021-12-30 DIAGNOSIS — Z79899 Other long term (current) drug therapy: Secondary | ICD-10-CM | POA: Diagnosis not present

## 2021-12-30 DIAGNOSIS — M79603 Pain in arm, unspecified: Secondary | ICD-10-CM | POA: Diagnosis not present

## 2021-12-30 DIAGNOSIS — M5412 Radiculopathy, cervical region: Secondary | ICD-10-CM | POA: Diagnosis not present

## 2021-12-30 DIAGNOSIS — M25511 Pain in right shoulder: Secondary | ICD-10-CM | POA: Diagnosis not present

## 2021-12-30 DIAGNOSIS — G894 Chronic pain syndrome: Secondary | ICD-10-CM | POA: Diagnosis not present

## 2021-12-30 DIAGNOSIS — M150 Primary generalized (osteo)arthritis: Secondary | ICD-10-CM | POA: Diagnosis not present

## 2022-01-04 ENCOUNTER — Ambulatory Visit
Admission: RE | Admit: 2022-01-04 | Discharge: 2022-01-04 | Disposition: A | Discharge status: Home / Self Care | Payer: HMO | Source: Ambulatory Visit | Attending: Family Medicine | Admitting: Family Medicine

## 2022-01-04 ENCOUNTER — Other Ambulatory Visit: Payer: Self-pay

## 2022-01-04 DIAGNOSIS — Z1231 Encounter for screening mammogram for malignant neoplasm of breast: Secondary | ICD-10-CM | POA: Diagnosis not present

## 2022-01-07 DIAGNOSIS — H25043 Posterior subcapsular polar age-related cataract, bilateral: Secondary | ICD-10-CM | POA: Diagnosis not present

## 2022-01-07 DIAGNOSIS — H2511 Age-related nuclear cataract, right eye: Secondary | ICD-10-CM | POA: Diagnosis not present

## 2022-01-07 DIAGNOSIS — H25013 Cortical age-related cataract, bilateral: Secondary | ICD-10-CM | POA: Diagnosis not present

## 2022-01-07 DIAGNOSIS — H18413 Arcus senilis, bilateral: Secondary | ICD-10-CM | POA: Diagnosis not present

## 2022-01-07 DIAGNOSIS — H2513 Age-related nuclear cataract, bilateral: Secondary | ICD-10-CM | POA: Diagnosis not present

## 2022-01-11 ENCOUNTER — Encounter (INDEPENDENT_AMBULATORY_CARE_PROVIDER_SITE_OTHER): Payer: Self-pay | Admitting: Family Medicine

## 2022-01-11 ENCOUNTER — Ambulatory Visit (INDEPENDENT_AMBULATORY_CARE_PROVIDER_SITE_OTHER): Payer: HMO | Admitting: Family Medicine

## 2022-01-11 ENCOUNTER — Other Ambulatory Visit: Payer: Self-pay

## 2022-01-11 VITALS — BP 117/55 | HR 57 | Temp 98.0°F | Ht 64.0 in | Wt 190.0 lb

## 2022-01-11 DIAGNOSIS — I10 Essential (primary) hypertension: Secondary | ICD-10-CM | POA: Diagnosis not present

## 2022-01-11 DIAGNOSIS — E559 Vitamin D deficiency, unspecified: Secondary | ICD-10-CM

## 2022-01-11 DIAGNOSIS — Z6832 Body mass index (BMI) 32.0-32.9, adult: Secondary | ICD-10-CM | POA: Diagnosis not present

## 2022-01-11 DIAGNOSIS — M25511 Pain in right shoulder: Secondary | ICD-10-CM | POA: Diagnosis not present

## 2022-01-11 DIAGNOSIS — M5412 Radiculopathy, cervical region: Secondary | ICD-10-CM | POA: Diagnosis not present

## 2022-01-11 DIAGNOSIS — E1169 Type 2 diabetes mellitus with other specified complication: Secondary | ICD-10-CM | POA: Diagnosis not present

## 2022-01-11 DIAGNOSIS — Z7984 Long term (current) use of oral hypoglycemic drugs: Secondary | ICD-10-CM | POA: Diagnosis not present

## 2022-01-11 DIAGNOSIS — E669 Obesity, unspecified: Secondary | ICD-10-CM | POA: Diagnosis not present

## 2022-01-11 DIAGNOSIS — M79603 Pain in arm, unspecified: Secondary | ICD-10-CM | POA: Diagnosis not present

## 2022-01-11 MED ORDER — VITAMIN D (ERGOCALCIFEROL) 1.25 MG (50000 UNIT) PO CAPS: 50000.0000 [IU] | ORAL_CAPSULE | ORAL | 0 refills | Status: DC

## 2022-01-12 ENCOUNTER — Institutional Professional Consult (permissible substitution): Payer: Self-pay | Admitting: Plastic Surgery

## 2022-01-12 NOTE — Progress Notes (Signed)
? ? ? ?Chief Complaint:  ? ?OBESITY ?Mikiala is here to discuss her progress with her obesity treatment plan along with follow-up of her obesity related diagnoses. Lakedra is on the Category 2 Plan and states she is following her eating plan approximately 70% of the time. Nissa states she is doing physical therapy for 60 minutes 1-2 times per week. ? ?Today's visit was #: 14 ?Starting weight: 219 lbs ?Starting date: 04/22/2021 ?Today's weight: 190 lbs ?Today's date: 01/11/2022 ?Total lbs lost to date: 59 ?Total lbs lost since last in-office visit: 0 ? ?Interim History: Niti has gotten more off track in the last month. She notes struggling with increased snacking. She is deviating from her plan and she is trying to eat some protein, but her level is likely dropping low enough to decrease her metabolism.  ? ?Subjective:  ? ?1. Type 2 diabetes mellitus with other specified complication, without long-term current use of insulin ([place]) ?Carliss decreased metformin to once daily. Her A1c is at goal and she is doing well with her diet overall.  ? ?2. Vitamin D deficiency ?Lenzi's Vit D level at goal, and she is now at the high end of normal and at risk of over-replacement. ? ?3. Essential hypertension ?Darnell's blood pressure is well controlled with diet and weight loss. She denies signs of hypotension.  ? ?Assessment/Plan:  ? ?1. Type 2 diabetes mellitus with other specified complication, without long-term current use of insulin ([place]) ?Elbia will continue with diet and exercise, and we will continue to follow. ? ?2. Vitamin D deficiency ?Sheilah agreed to decrease prescription Vitamin D to 50,000 IU every 2 weeks, and we will refill for 90 days. We will recheck labs in 3 months.  ? ?- Vitamin D, Ergocalciferol, (DRISDOL) 1.25 MG (50000 UNIT) CAPS capsule; Take 1 capsule (50,000 Units total) by mouth every 14 days.  Dispense: 6 capsule; Refill: 0 ? ?3. Essential hypertension ?Kimbella is to discuss the possibly of  decreasing her anti-hypertensives, and will continue to monitor and she was warned to look for signs of hypotension. ? ?4. Obesity with current BMI 32.6 ?Akaila is currently in the action stage of change. As such, her goal is to continue with weight loss efforts. She has agreed to practicing portion control and making smarter food choices, such as increasing vegetables and decreasing simple carbohydrates.  ? ?Sia was encouraged to increase her protein especially earlier in the day. ? ?Exercise goals: As is. ? ?Behavioral modification strategies: increasing lean protein intake. ? ?Michella has agreed to follow-up with our clinic in 4 weeks. She was informed of the importance of frequent follow-up visits to maximize her success with intensive lifestyle modifications for her multiple health conditions.  ? ?Objective:  ? ?Blood pressure (!) 117/55, pulse (!) 57, temperature 98 ?F (36.7 ?C), height '5\' 4"'$  (1.626 m), weight 190 lb (86.2 kg), SpO2 98 %. ?Body mass index is 32.61 kg/m?. ? ?General: Cooperative, alert, well developed, in no acute distress. ?HEENT: Conjunctivae and lids unremarkable. ?Cardiovascular: Regular rhythm.  ?Lungs: Normal work of breathing. ?Neurologic: No focal deficits.  ? ?Lab Results  ?Component Value Date  ? CREATININE 0.86 12/10/2021  ? BUN 16 12/10/2021  ? NA 135 12/10/2021  ? K 4.9 12/10/2021  ? CL 95 (L) 12/10/2021  ? CO2 25 12/10/2021  ? ?Lab Results  ?Component Value Date  ? ALT 9 12/10/2021  ? AST 14 12/10/2021  ? ALKPHOS 45 12/10/2021  ? BILITOT 0.2 12/10/2021  ? ?Lab Results  ?  Component Value Date  ? HGBA1C 5.9 (H) 12/10/2021  ? HGBA1C 6.7 (H) 04/22/2021  ? HGBA1C 8.9 (H) 11/13/2018  ? HGBA1C 7.7 (H) 11/25/2017  ? HGBA1C 7.2 (H) 08/24/2017  ? ?Lab Results  ?Component Value Date  ? INSULIN 13.3 12/10/2021  ? INSULIN 22.3 04/22/2021  ? ?Lab Results  ?Component Value Date  ? TSH 1.680 04/22/2021  ? ?Lab Results  ?Component Value Date  ? CHOL 135 12/10/2021  ? HDL 60 12/10/2021  ?  LDLCALC 59 12/10/2021  ? TRIG 84 12/10/2021  ? ?Lab Results  ?Component Value Date  ? VD25OH 86.9 12/10/2021  ? VD25OH 40.7 04/22/2021  ? ?Lab Results  ?Component Value Date  ? WBC 7.6 06/04/2021  ? HGB 11.8 (L) 06/04/2021  ? HCT 36.9 06/04/2021  ? MCV 87.0 06/04/2021  ? PLT 275 06/04/2021  ? ?No results found for: IRON, TIBC, FERRITIN ? ?Obesity Behavioral Intervention:  ? ?Approximately 15 minutes were spent on the discussion below. ? ?ASK: ?We discussed the diagnosis of obesity with Benedicta today and Ghislaine agreed to give [place] permission to discuss obesity behavioral modification therapy today. ? ?ASSESS: ?Atticus has the diagnosis of obesity and her BMI today is 32.6. Crystalmarie is in the action stage of change.  ? ?ADVISE: ?Kerrianne was educated on the multiple health risks of obesity as well as the benefit of weight loss to improve her health. She was advised of the need for long term treatment and the importance of lifestyle modifications to improve her current health and to decrease her risk of future health problems. ? ?AGREE: ?Multiple dietary modification options and treatment options were discussed and Areal agreed to follow the recommendations documented in the above note. ? ?ARRANGE: ?Rashel was educated on the importance of frequent visits to treat obesity as outlined per CMS and USPSTF guidelines and agreed to schedule her next follow up appointment today. ? ?Attestation Statements:  ? ?Reviewed by clinician on day of visit: allergies, medications, problem list, medical history, surgical history, family history, social history, and previous encounter notes. ? ? ?I, Trixie [name], am acting as transcriptionist for Dennard Nip, MD. ? ?I have reviewed the above documentation for accuracy and completeness, and I agree with the above. -  Dennard Nip, MD ? ? ?

## 2022-01-14 DIAGNOSIS — M5412 Radiculopathy, cervical region: Secondary | ICD-10-CM | POA: Diagnosis not present

## 2022-01-14 DIAGNOSIS — M79603 Pain in arm, unspecified: Secondary | ICD-10-CM | POA: Diagnosis not present

## 2022-01-14 DIAGNOSIS — M25511 Pain in right shoulder: Secondary | ICD-10-CM | POA: Diagnosis not present

## 2022-01-20 DIAGNOSIS — M5412 Radiculopathy, cervical region: Secondary | ICD-10-CM | POA: Diagnosis not present

## 2022-01-20 DIAGNOSIS — M79603 Pain in arm, unspecified: Secondary | ICD-10-CM | POA: Diagnosis not present

## 2022-01-20 DIAGNOSIS — M25511 Pain in right shoulder: Secondary | ICD-10-CM | POA: Diagnosis not present

## 2022-01-25 DIAGNOSIS — M5412 Radiculopathy, cervical region: Secondary | ICD-10-CM | POA: Diagnosis not present

## 2022-01-25 DIAGNOSIS — M79603 Pain in arm, unspecified: Secondary | ICD-10-CM | POA: Diagnosis not present

## 2022-01-25 DIAGNOSIS — M25511 Pain in right shoulder: Secondary | ICD-10-CM | POA: Diagnosis not present

## 2022-02-01 ENCOUNTER — Encounter: Payer: Self-pay | Admitting: Orthopaedic Surgery

## 2022-02-01 ENCOUNTER — Ambulatory Visit: Payer: HMO | Admitting: Orthopaedic Surgery

## 2022-02-01 DIAGNOSIS — M25511 Pain in right shoulder: Secondary | ICD-10-CM | POA: Diagnosis not present

## 2022-02-01 DIAGNOSIS — G8929 Other chronic pain: Secondary | ICD-10-CM | POA: Diagnosis not present

## 2022-02-01 DIAGNOSIS — M25561 Pain in right knee: Secondary | ICD-10-CM | POA: Diagnosis not present

## 2022-02-01 DIAGNOSIS — S46211A Strain of muscle, fascia and tendon of other parts of biceps, right arm, initial encounter: Secondary | ICD-10-CM

## 2022-02-01 DIAGNOSIS — M1711 Unilateral primary osteoarthritis, right knee: Secondary | ICD-10-CM | POA: Diagnosis not present

## 2022-02-01 NOTE — Progress Notes (Signed)
Lauren [name] is well-known to [place].  She has been dealing with shoulder issues since January when she injured her shoulder.  The subacromial injection did help things calm down but she was still having some pain.  About 2 weeks ago she was just grabbing her covers the pulled him over her when she felt a painful pop in her shoulder.  She had bruising after that.  This is all on the right shoulder.  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 ? ?She does have significant bruising of her right arm and the biceps muscle is an inferior position showing a proximal biceps tendon rupture.  The distal biceps tendon is intact.  She does have good range of motion of the right shoulder and it is painful. ? ?She is 76 years old and very active.  This should feel better with time in terms of the pain and inflammation from the proximal biceps tendon rupture.  I would not recommend any type of intervention for this at all.  I would like to see her back in 4 weeks to see how she is doing overall.  At that point we can work on scheduling her for a right total knee arthroplasty.  She agrees with this treatment plan. ?

## 2022-02-02 ENCOUNTER — Ambulatory Visit (INDEPENDENT_AMBULATORY_CARE_PROVIDER_SITE_OTHER): Payer: Self-pay | Admitting: Surgical

## 2022-02-02 ENCOUNTER — Other Ambulatory Visit: Payer: Self-pay

## 2022-02-02 DIAGNOSIS — Z411 Encounter for cosmetic surgery: Secondary | ICD-10-CM

## 2022-02-02 NOTE — Progress Notes (Signed)
Preoperative Dx: Hyperpigmentation, sun damage, telangiectasias ? ?Postoperative Dx:  same ? ?Procedure: laser to face ? ?Anesthesia: none ? ?Description of Procedure:  ?Risks and complications were explained to the patient. Consent was confirmed and signed. Time out was called and all information was confirmed to be correct. The area  area was prepped with alcohol and wiped dry. The PR 530 IPL laser was set at 7.6 J/cm2. The face was lasered.  The PR 530 IPL laser was then set at 7.8 J/cm? and the face was lasered with a second pass. The patient tolerated the procedure well and there were no complications. The patient is to follow up in 4 weeks. ? ? ?

## 2022-02-03 ENCOUNTER — Other Ambulatory Visit (INDEPENDENT_AMBULATORY_CARE_PROVIDER_SITE_OTHER): Payer: Self-pay | Admitting: Family Medicine

## 2022-02-03 DIAGNOSIS — E559 Vitamin D deficiency, unspecified: Secondary | ICD-10-CM

## 2022-02-04 DIAGNOSIS — M79603 Pain in arm, unspecified: Secondary | ICD-10-CM | POA: Diagnosis not present

## 2022-02-04 DIAGNOSIS — M5412 Radiculopathy, cervical region: Secondary | ICD-10-CM | POA: Diagnosis not present

## 2022-02-04 DIAGNOSIS — M25511 Pain in right shoulder: Secondary | ICD-10-CM | POA: Diagnosis not present

## 2022-02-08 DIAGNOSIS — M79603 Pain in arm, unspecified: Secondary | ICD-10-CM | POA: Diagnosis not present

## 2022-02-08 DIAGNOSIS — M5412 Radiculopathy, cervical region: Secondary | ICD-10-CM | POA: Diagnosis not present

## 2022-02-08 DIAGNOSIS — M25511 Pain in right shoulder: Secondary | ICD-10-CM | POA: Diagnosis not present

## 2022-02-10 DIAGNOSIS — Z79899 Other long term (current) drug therapy: Secondary | ICD-10-CM | POA: Diagnosis not present

## 2022-02-10 DIAGNOSIS — G894 Chronic pain syndrome: Secondary | ICD-10-CM | POA: Diagnosis not present

## 2022-02-10 DIAGNOSIS — M150 Primary generalized (osteo)arthritis: Secondary | ICD-10-CM | POA: Diagnosis not present

## 2022-02-10 DIAGNOSIS — M47817 Spondylosis without myelopathy or radiculopathy, lumbosacral region: Secondary | ICD-10-CM | POA: Diagnosis not present

## 2022-02-11 DIAGNOSIS — M25511 Pain in right shoulder: Secondary | ICD-10-CM | POA: Diagnosis not present

## 2022-02-11 DIAGNOSIS — M79603 Pain in arm, unspecified: Secondary | ICD-10-CM | POA: Diagnosis not present

## 2022-02-11 DIAGNOSIS — M5412 Radiculopathy, cervical region: Secondary | ICD-10-CM | POA: Diagnosis not present

## 2022-02-15 ENCOUNTER — Ambulatory Visit (INDEPENDENT_AMBULATORY_CARE_PROVIDER_SITE_OTHER): Payer: HMO | Admitting: Family Medicine

## 2022-02-17 DIAGNOSIS — M25511 Pain in right shoulder: Secondary | ICD-10-CM | POA: Diagnosis not present

## 2022-02-17 DIAGNOSIS — M5412 Radiculopathy, cervical region: Secondary | ICD-10-CM | POA: Diagnosis not present

## 2022-02-17 DIAGNOSIS — M79603 Pain in arm, unspecified: Secondary | ICD-10-CM | POA: Diagnosis not present

## 2022-02-18 ENCOUNTER — Ambulatory Visit (INDEPENDENT_AMBULATORY_CARE_PROVIDER_SITE_OTHER): Payer: HMO | Admitting: Family Medicine

## 2022-02-18 ENCOUNTER — Encounter (INDEPENDENT_AMBULATORY_CARE_PROVIDER_SITE_OTHER): Payer: Self-pay | Admitting: Family Medicine

## 2022-02-18 VITALS — BP 102/59 | HR 66 | Temp 97.8°F | Ht 64.0 in | Wt 188.0 lb

## 2022-02-18 DIAGNOSIS — Z6832 Body mass index (BMI) 32.0-32.9, adult: Secondary | ICD-10-CM | POA: Diagnosis not present

## 2022-02-18 DIAGNOSIS — E559 Vitamin D deficiency, unspecified: Secondary | ICD-10-CM

## 2022-02-18 DIAGNOSIS — M549 Dorsalgia, unspecified: Secondary | ICD-10-CM

## 2022-02-23 DIAGNOSIS — M79603 Pain in arm, unspecified: Secondary | ICD-10-CM | POA: Diagnosis not present

## 2022-02-23 DIAGNOSIS — M25511 Pain in right shoulder: Secondary | ICD-10-CM | POA: Diagnosis not present

## 2022-02-23 DIAGNOSIS — M5412 Radiculopathy, cervical region: Secondary | ICD-10-CM | POA: Diagnosis not present

## 2022-02-25 ENCOUNTER — Other Ambulatory Visit (INDEPENDENT_AMBULATORY_CARE_PROVIDER_SITE_OTHER): Payer: Self-pay | Admitting: Family Medicine

## 2022-02-25 DIAGNOSIS — E559 Vitamin D deficiency, unspecified: Secondary | ICD-10-CM

## 2022-02-26 DIAGNOSIS — M25511 Pain in right shoulder: Secondary | ICD-10-CM | POA: Diagnosis not present

## 2022-02-26 DIAGNOSIS — M5412 Radiculopathy, cervical region: Secondary | ICD-10-CM | POA: Diagnosis not present

## 2022-02-26 DIAGNOSIS — M79603 Pain in arm, unspecified: Secondary | ICD-10-CM | POA: Diagnosis not present

## 2022-03-01 ENCOUNTER — Encounter: Payer: Self-pay | Admitting: Orthopaedic Surgery

## 2022-03-01 ENCOUNTER — Ambulatory Visit: Payer: HMO | Admitting: Orthopaedic Surgery

## 2022-03-01 DIAGNOSIS — S46211A Strain of muscle, fascia and tendon of other parts of biceps, right arm, initial encounter: Secondary | ICD-10-CM

## 2022-03-01 DIAGNOSIS — M1711 Unilateral primary osteoarthritis, right knee: Secondary | ICD-10-CM | POA: Diagnosis not present

## 2022-03-01 NOTE — Progress Notes (Signed)
The patient is well-known to me.  She has well-documented end-stage arthritis of her right knee.  She has tried and failed all forms conservative treatment for many years now as a relates to his right knee.  I replaced her left knee in 2019 and have also replaced her right hip.  She has been dealing with a proximal biceps tendon rupture.  That is getting better.  She is an active 76 year old but does live alone but has family available.  She is at the point where she does wish to have her right knee replaced and scheduled for this in July of this summer so she will have help at home.  Again we have tried all forms conservative treatment for her left knee arthritis and this has been well-documented.  Having had knee replacement surgery before she is fully aware of what this involves including the risk and benefits of surgery and what to expect from an intraoperative and postoperative course. ? ?Examination of her right shoulder shows it moves smoothly and fluidly with just only some mild pain.  Her right knee has varus malalignment and patellofemoral crepitation with global tenderness of the arc of motion.  There is medial and lateral compartment and patellofemoral tenderness.  The knee is ligamentously stable. ? ?Previous x-rays of the right knee show severe end-stage arthritis of the right knee. ? ?We will work on getting her scheduled for a right knee replacement in July of this summer.  All question concerns were answered and addressed.  We will be in touch about scheduling surgery.  She is diabetic but her hemoglobin A1c is 5.9. ?

## 2022-03-02 DIAGNOSIS — E785 Hyperlipidemia, unspecified: Secondary | ICD-10-CM | POA: Diagnosis not present

## 2022-03-02 DIAGNOSIS — K5150 Left sided colitis without complications: Secondary | ICD-10-CM | POA: Diagnosis not present

## 2022-03-02 DIAGNOSIS — M5412 Radiculopathy, cervical region: Secondary | ICD-10-CM | POA: Diagnosis not present

## 2022-03-02 DIAGNOSIS — E559 Vitamin D deficiency, unspecified: Secondary | ICD-10-CM | POA: Diagnosis not present

## 2022-03-02 DIAGNOSIS — E1136 Type 2 diabetes mellitus with diabetic cataract: Secondary | ICD-10-CM | POA: Diagnosis not present

## 2022-03-02 DIAGNOSIS — K760 Fatty (change of) liver, not elsewhere classified: Secondary | ICD-10-CM | POA: Diagnosis not present

## 2022-03-02 DIAGNOSIS — K219 Gastro-esophageal reflux disease without esophagitis: Secondary | ICD-10-CM | POA: Diagnosis not present

## 2022-03-02 DIAGNOSIS — E669 Obesity, unspecified: Secondary | ICD-10-CM | POA: Diagnosis not present

## 2022-03-02 DIAGNOSIS — G4700 Insomnia, unspecified: Secondary | ICD-10-CM | POA: Diagnosis not present

## 2022-03-02 DIAGNOSIS — I10 Essential (primary) hypertension: Secondary | ICD-10-CM | POA: Diagnosis not present

## 2022-03-02 DIAGNOSIS — G8929 Other chronic pain: Secondary | ICD-10-CM | POA: Diagnosis not present

## 2022-03-02 DIAGNOSIS — E1169 Type 2 diabetes mellitus with other specified complication: Secondary | ICD-10-CM | POA: Diagnosis not present

## 2022-03-02 DIAGNOSIS — M1990 Unspecified osteoarthritis, unspecified site: Secondary | ICD-10-CM | POA: Diagnosis not present

## 2022-03-02 DIAGNOSIS — M25511 Pain in right shoulder: Secondary | ICD-10-CM | POA: Diagnosis not present

## 2022-03-02 DIAGNOSIS — M79603 Pain in arm, unspecified: Secondary | ICD-10-CM | POA: Diagnosis not present

## 2022-03-03 NOTE — Progress Notes (Signed)
? ? ? ?Chief Complaint:  ? ?OBESITY ?Lauren [name] is here to discuss her progress with her obesity treatment plan along with follow-up of her obesity related diagnoses. Lauren [name] is on practicing portion control and making smarter food choices, such as increasing vegetables and decreasing simple carbohydrates and states she is following her eating plan approximately 70% of the time. Lauren [name] states she is walking for 20 minutes 4-5 times per week. ? ?Today's visit was #: 15 ?Starting weight: 219 lbs ?Starting date: 04/22/2021 ?Today's weight: 188 lbs ?Today's date: 02/18/2022 ?Total lbs lost to date: 11 ?Total lbs lost since last in-office visit: 2 ? ?Interim History: Lauren [name] continues to do well with weight loss. She has struggled with some stress eating.  ? ?Subjective:  ? ?1. Back pain, unspecified back location, unspecified back pain laterality, unspecified chronicity ?Lauren [name] is limited with exercise due to back pain. She is getting ready to have a nerve ablation and hopefully this will help her enough to increase her exercise. ? ?2. Vitamin D deficiency ?Lauren [name] is taking Vit D prescription, and she denies nausea, vomiting, or muscle weakness. ? ?Assessment/Plan:  ? ?1. Back pain, unspecified back location, unspecified back pain laterality, unspecified chronicity ?Lauren [name] will continue to follow and slowly increase her activity as tolerated.  ? ?2. Vitamin D deficiency ?We will refill prescription Vitamin D for 1 month. Lauren [name] will follow-up for routine testing of Vitamin D, at least 2-3 times per year to avoid over-replacement. ? ?3. Obesity with current BMI 32.3 ?Lauren [name] is currently in the action stage of change. As such, her goal is to continue with weight loss efforts. She has agreed to the Category 2 Plan with breakfast options.  ? ?Exercise goals: As is.  ? ?Behavioral modification strategies: increasing lean protein intake, decreasing simple carbohydrates, and no skipping meals. ? ?Lauren [name] has agreed to  follow-up with our clinic in 4 weeks. She was informed of the importance of frequent follow-up visits to maximize her success with intensive lifestyle modifications for her multiple health conditions.  ? ?Objective:  ? ?Blood pressure (!) 102/59, pulse 66, temperature 97.8 ?F (36.6 ?C), height '5\' 4"'$  (1.626 m), weight 188 lb (85.3 kg), SpO2 96 %. ?Body mass index is 32.27 kg/m?. ? ?General: Cooperative, alert, well developed, in no acute distress. ?HEENT: Conjunctivae and lids unremarkable. ?Cardiovascular: Regular rhythm.  ?Lungs: Normal work of breathing. ?Neurologic: No focal deficits.  ? ?Lab Results  ?Component Value Date  ? CREATININE 0.86 12/10/2021  ? BUN 16 12/10/2021  ? NA 135 12/10/2021  ? K 4.9 12/10/2021  ? CL 95 (L) 12/10/2021  ? CO2 25 12/10/2021  ? ?Lab Results  ?Component Value Date  ? ALT 9 12/10/2021  ? AST 14 12/10/2021  ? ALKPHOS 45 12/10/2021  ? BILITOT 0.2 12/10/2021  ? ?Lab Results  ?Component Value Date  ? HGBA1C 5.9 (H) 12/10/2021  ? HGBA1C 6.7 (H) 04/22/2021  ? HGBA1C 8.9 (H) 11/13/2018  ? HGBA1C 7.7 (H) 11/25/2017  ? HGBA1C 7.2 (H) 08/24/2017  ? ?Lab Results  ?Component Value Date  ? INSULIN 13.3 12/10/2021  ? INSULIN 22.3 04/22/2021  ? ?Lab Results  ?Component Value Date  ? TSH 1.680 04/22/2021  ? ?Lab Results  ?Component Value Date  ? CHOL 135 12/10/2021  ? HDL 60 12/10/2021  ? LDLCALC 59 12/10/2021  ? TRIG 84 12/10/2021  ? ?Lab Results  ?Component Value Date  ? VD25OH 86.9 12/10/2021  ? VD25OH 40.7 04/22/2021  ? ?Lab Results  ?Component Value Date  ?  WBC 7.6 06/04/2021  ? HGB 11.8 (L) 06/04/2021  ? HCT 36.9 06/04/2021  ? MCV 87.0 06/04/2021  ? PLT 275 06/04/2021  ? ?No results found for: IRON, TIBC, FERRITIN ? ?Attestation Statements:  ? ?Reviewed by clinician on day of visit: allergies, medications, problem list, medical history, surgical history, family history, social history, and previous encounter notes. ? ?Time spent on visit including pre-visit chart review and post-visit care  and charting was 35 minutes.  ? ? ?I, Trixie [name], am acting as transcriptionist for Dennard Nip, MD. ? ?I have reviewed the above documentation for accuracy and completeness, and I agree with the above. -  Dennard Nip, MD ? ? ?

## 2022-03-04 DIAGNOSIS — M5412 Radiculopathy, cervical region: Secondary | ICD-10-CM | POA: Diagnosis not present

## 2022-03-04 DIAGNOSIS — M25511 Pain in right shoulder: Secondary | ICD-10-CM | POA: Diagnosis not present

## 2022-03-04 DIAGNOSIS — M79603 Pain in arm, unspecified: Secondary | ICD-10-CM | POA: Diagnosis not present

## 2022-03-10 ENCOUNTER — Telehealth (INDEPENDENT_AMBULATORY_CARE_PROVIDER_SITE_OTHER): Payer: Self-pay | Admitting: Family Medicine

## 2022-03-10 DIAGNOSIS — E559 Vitamin D deficiency, unspecified: Secondary | ICD-10-CM

## 2022-03-10 DIAGNOSIS — M25511 Pain in right shoulder: Secondary | ICD-10-CM | POA: Diagnosis not present

## 2022-03-10 DIAGNOSIS — M5412 Radiculopathy, cervical region: Secondary | ICD-10-CM | POA: Diagnosis not present

## 2022-03-10 DIAGNOSIS — M79603 Pain in arm, unspecified: Secondary | ICD-10-CM | POA: Diagnosis not present

## 2022-03-10 MED ORDER — VITAMIN D (ERGOCALCIFEROL) 1.25 MG (50000 UNIT) PO CAPS: 50000.0000 [IU] | ORAL_CAPSULE | ORAL | 0 refills | Status: DC

## 2022-03-10 NOTE — Telephone Encounter (Signed)
Patient called for REFILL:  Vitamin D Pharmacy :  [id number] Patient phone number: [phone] seen:  02/18/22 COMMENTS:  Needs to begin with first dose on Sunday  03/14/22.

## 2022-03-10 NOTE — Telephone Encounter (Signed)
Ok to rf x 1

## 2022-03-10 NOTE — Telephone Encounter (Signed)
LAST APPOINTMENT DATE: 02/18/2022 with Dr. Leafy Ro ?NEXT APPOINTMENT DATE: 03/22/2022 with Dr. Leafy Ro ? ? ?[id number] [address] [place]?Phone: [phone]: [phone]? ?[name]?Phone: [phone]: [phone]? ?[facility] [place] [address] ?[address] ?[id number]?Phone: [phone]: [phone]? ?Patient is requesting a refill of the following medications: ?Requested Prescriptions  ? ? No prescriptions requested or ordered in this encounter  ? ? ?Date last filled: 01/11/2022 #4 ?Previously prescribed by BLeafy Ro? ?Lab Results  ?Component Value Date  ? HGBA1C 5.9 (H) 12/10/2021  ? HGBA1C 6.7 (H) 04/22/2021  ? HGBA1C 8.9 (H) 11/13/2018  ? ?Lab Results  ?Component Value Date  ? LDLCALC 59 12/10/2021  ? CREATININE 0.86 12/10/2021  ? ?Lab Results  ?Component Value Date  ? VD25OH 86.9 12/10/2021  ? VD25OH 40.7 04/22/2021  ? ? ?BP Readings from Last 3 Encounters:  ?02/18/22 (!) 102/59  ?01/11/22 (!) 117/55  ?12/10/21 110/69  ? ? ?

## 2022-03-10 NOTE — Addendum Note (Signed)
Addended by: Ronaldo Miyamoto on: 03/10/2022 02:34 PM ? ? Modules accepted: Orders ? ?

## 2022-03-10 NOTE — Telephone Encounter (Signed)
Dr.Beasley 

## 2022-03-11 ENCOUNTER — Ambulatory Visit (INDEPENDENT_AMBULATORY_CARE_PROVIDER_SITE_OTHER): Payer: Self-pay | Admitting: Surgical

## 2022-03-11 DIAGNOSIS — Z411 Encounter for cosmetic surgery: Secondary | ICD-10-CM

## 2022-03-11 NOTE — Progress Notes (Signed)
Preoperative Dx: Facial hyperpigmentation and facial telangiectasias ? ?Postoperative Dx:  same ? ?Procedure: laser to face ? ?Anesthesia: none ? ?Description of Procedure:  ?Risks and complications were explained to the patient. Consent was confirmed and signed. Time out was called and all information was confirmed to be correct. The area  area was prepped with alcohol and wiped dry. The 560 nm laser was set at 7.5 J/cm?, the face was lasered, the 515 nm laser was set at 7.5 J/cm?Marland Kitchen  The face was lasered.  A total of 385 passes for the face were completed in total.  ? ?Attention was then turned to the telangiectasias on patient's nose and bilateral cheeks, vascular settings were applied at 18 J/cm?, 30 ms and 20 ?C.  The cheeks, nose vascular telangiectasias were lasered using the 3 mm circle handpiece. ? ?The patient tolerated the procedure well.  Recommend avoiding direct sun exposure, keeping the area highly moisturized.  After the treatment, she did have some questionable increased redness over the left anterior cheek, no specific blistering was noted, however recommend continuing to watch this area for developing any blistering.   ? ?Call with questions or concerns. ? ? ?

## 2022-03-14 ENCOUNTER — Other Ambulatory Visit (INDEPENDENT_AMBULATORY_CARE_PROVIDER_SITE_OTHER): Payer: Self-pay | Admitting: Family Medicine

## 2022-03-14 DIAGNOSIS — E559 Vitamin D deficiency, unspecified: Secondary | ICD-10-CM

## 2022-03-15 DIAGNOSIS — M25511 Pain in right shoulder: Secondary | ICD-10-CM | POA: Diagnosis not present

## 2022-03-15 DIAGNOSIS — M79603 Pain in arm, unspecified: Secondary | ICD-10-CM | POA: Diagnosis not present

## 2022-03-15 DIAGNOSIS — M5412 Radiculopathy, cervical region: Secondary | ICD-10-CM | POA: Diagnosis not present

## 2022-03-17 ENCOUNTER — Other Ambulatory Visit: Payer: HMO | Admitting: Surgical

## 2022-03-17 DIAGNOSIS — M150 Primary generalized (osteo)arthritis: Secondary | ICD-10-CM | POA: Diagnosis not present

## 2022-03-17 DIAGNOSIS — M47817 Spondylosis without myelopathy or radiculopathy, lumbosacral region: Secondary | ICD-10-CM | POA: Diagnosis not present

## 2022-03-17 DIAGNOSIS — Z79899 Other long term (current) drug therapy: Secondary | ICD-10-CM | POA: Diagnosis not present

## 2022-03-17 DIAGNOSIS — G894 Chronic pain syndrome: Secondary | ICD-10-CM | POA: Diagnosis not present

## 2022-03-22 ENCOUNTER — Encounter (INDEPENDENT_AMBULATORY_CARE_PROVIDER_SITE_OTHER): Payer: Self-pay | Admitting: Family Medicine

## 2022-03-22 ENCOUNTER — Ambulatory Visit (INDEPENDENT_AMBULATORY_CARE_PROVIDER_SITE_OTHER): Payer: HMO | Admitting: Family Medicine

## 2022-03-22 VITALS — BP 110/70 | HR 65 | Temp 97.5°F | Ht 64.0 in | Wt 192.0 lb

## 2022-03-22 DIAGNOSIS — Z6833 Body mass index (BMI) 33.0-33.9, adult: Secondary | ICD-10-CM | POA: Diagnosis not present

## 2022-03-22 DIAGNOSIS — E669 Obesity, unspecified: Secondary | ICD-10-CM

## 2022-03-22 DIAGNOSIS — M79603 Pain in arm, unspecified: Secondary | ICD-10-CM | POA: Diagnosis not present

## 2022-03-22 DIAGNOSIS — E559 Vitamin D deficiency, unspecified: Secondary | ICD-10-CM | POA: Diagnosis not present

## 2022-03-22 DIAGNOSIS — E1169 Type 2 diabetes mellitus with other specified complication: Secondary | ICD-10-CM

## 2022-03-22 DIAGNOSIS — M5412 Radiculopathy, cervical region: Secondary | ICD-10-CM | POA: Diagnosis not present

## 2022-03-22 DIAGNOSIS — M25511 Pain in right shoulder: Secondary | ICD-10-CM | POA: Diagnosis not present

## 2022-03-22 MED ORDER — VITAMIN D (ERGOCALCIFEROL) 1.25 MG (50000 UNIT) PO CAPS: 50000.0000 [IU] | ORAL_CAPSULE | ORAL | 0 refills | Status: DC

## 2022-03-24 DIAGNOSIS — H2511 Age-related nuclear cataract, right eye: Secondary | ICD-10-CM | POA: Diagnosis not present

## 2022-03-25 ENCOUNTER — Other Ambulatory Visit (HOSPITAL_BASED_OUTPATIENT_CLINIC_OR_DEPARTMENT_OTHER): Payer: Self-pay | Admitting: Cardiovascular Disease

## 2022-03-25 DIAGNOSIS — H2512 Age-related nuclear cataract, left eye: Secondary | ICD-10-CM | POA: Diagnosis not present

## 2022-03-25 NOTE — Telephone Encounter (Signed)
Rx(s) sent to pharmacy electronically.  

## 2022-03-30 DIAGNOSIS — L299 Pruritus, unspecified: Secondary | ICD-10-CM | POA: Diagnosis not present

## 2022-03-30 DIAGNOSIS — I10 Essential (primary) hypertension: Secondary | ICD-10-CM | POA: Diagnosis not present

## 2022-03-30 DIAGNOSIS — E7800 Pure hypercholesterolemia, unspecified: Secondary | ICD-10-CM | POA: Diagnosis not present

## 2022-03-30 DIAGNOSIS — K5289 Other specified noninfective gastroenteritis and colitis: Secondary | ICD-10-CM | POA: Diagnosis not present

## 2022-03-30 DIAGNOSIS — Z6832 Body mass index (BMI) 32.0-32.9, adult: Secondary | ICD-10-CM | POA: Diagnosis not present

## 2022-03-30 DIAGNOSIS — E1169 Type 2 diabetes mellitus with other specified complication: Secondary | ICD-10-CM | POA: Diagnosis not present

## 2022-04-05 NOTE — Progress Notes (Signed)
Chief Complaint:   OBESITY Lauren [name] is here to discuss her progress with her obesity treatment plan along with follow-up of her obesity related diagnoses. Lauren [name] is on the Category 2 Plan with breakfast options and states she is following her eating plan approximately 65% of the time. Lauren [name] states she is doing physical therapy and walking for 45 minutes 2 times per week.  Today's visit was #: 75 Starting weight: 219 lbs Starting date: 04/22/2021 Today's weight: 192 lbs Today's date: 03/22/2022 Total lbs lost to date: 27 Total lbs lost since last in-office visit: 0  Interim History: Lauren [name] has struggled with meeting her protein goals. She is not cooking much and her stress has increased.   Subjective:   1. Vitamin D deficiency Lauren [name] is on Vitamin D prescription every 2 weeks.   2. Type 2 diabetes mellitus with other specified complication, without long-term current use of insulin (HCC) Lauren [name] continues to work on her diet and exercise, but has struggled more recently with increased simple carbohydrates.   Assessment/Plan:   1. Vitamin D deficiency We will refill prescription Vitamin D for 1 month. Lauren [name] will follow-up for routine testing of Vitamin D, at least 2-3 times per year to avoid over-replacement.  - Vitamin D, Ergocalciferol, (DRISDOL) 1.25 MG (50000 UNIT) CAPS capsule; Take 1 capsule (50,000 Units total) by mouth every 7 (seven) days.  Dispense: 4 capsule; Refill: 0  2. Type 2 diabetes mellitus with other specified complication, without long-term current use of insulin (HCC) We will check labs at her next visit. Good blood sugar control is important to decrease the likelihood of diabetic complications such as nephropathy, neuropathy, limb loss, blindness, coronary artery disease, and death. Intensive lifestyle modification including diet, exercise and weight loss are the first line of treatment for diabetes.   3. Obesity, Current BMI 33.0 Lauren [name] is currently  in the action stage of change. As such, her goal is to continue with weight loss efforts. She has agreed to keeping a food journal and adhering to recommended goals of 1200-1400 calories and 80+ grams of protein daily.   Exercise goals: As is.  Behavioral modification strategies: increasing lean protein intake and meal planning and cooking strategies.  Lauren [name] has agreed to follow-up with our clinic in 3 weeks. She was informed of the importance of frequent follow-up visits to maximize her success with intensive lifestyle modifications for her multiple health conditions.   Objective:   Blood pressure 110/70, pulse 65, temperature (!) 97.5 F (36.4 C), height '5\' 4"'$  (1.626 m), weight 192 lb (87.1 kg), SpO2 96 %. Body mass index is 32.96 kg/m.  General: Cooperative, alert, well developed, in no acute distress. HEENT: Conjunctivae and lids unremarkable. Cardiovascular: Regular rhythm.  Lungs: Normal work of breathing. Neurologic: No focal deficits.   Lab Results  Component Value Date   CREATININE 0.86 12/10/2021   BUN 16 12/10/2021   NA 135 12/10/2021   K 4.9 12/10/2021   CL 95 (L) 12/10/2021   CO2 25 12/10/2021   Lab Results  Component Value Date   ALT 9 12/10/2021   AST 14 12/10/2021   ALKPHOS 45 12/10/2021   BILITOT 0.2 12/10/2021   Lab Results  Component Value Date   HGBA1C 5.9 (H) 12/10/2021   HGBA1C 6.7 (H) 04/22/2021   HGBA1C 8.9 (H) 11/13/2018   HGBA1C 7.7 (H) 11/25/2017   HGBA1C 7.2 (H) 08/24/2017   Lab Results  Component Value Date   INSULIN 13.3 12/10/2021   INSULIN 22.3 04/22/2021  Lab Results  Component Value Date   TSH 1.680 04/22/2021   Lab Results  Component Value Date   CHOL 135 12/10/2021   HDL 60 12/10/2021   LDLCALC 59 12/10/2021   TRIG 84 12/10/2021   Lab Results  Component Value Date   VD25OH 86.9 12/10/2021   VD25OH 40.7 04/22/2021   Lab Results  Component Value Date   WBC 7.6 06/04/2021   HGB 11.8 (L) 06/04/2021   HCT 36.9  06/04/2021   MCV 87.0 06/04/2021   PLT 275 06/04/2021   No results found for: IRON, TIBC, FERRITIN  Obesity Behavioral Intervention:   Approximately 15 minutes were spent on the discussion below.  ASK: We discussed the diagnosis of obesity with Lauren [name] today and Lauren [name] agreed to give [place] permission to discuss obesity behavioral modification therapy today.  ASSESS: Lauren [name] has the diagnosis of obesity and her BMI today is 33.0. Lauren [name] is in the action stage of change.   ADVISE: Lauren [name] was educated on the multiple health risks of obesity as well as the benefit of weight loss to improve her health. She was advised of the need for long term treatment and the importance of lifestyle modifications to improve her current health and to decrease her risk of future health problems.  AGREE: Multiple dietary modification options and treatment options were discussed and Lauren [name] agreed to follow the recommendations documented in the above note.  ARRANGE: Lauren [name] was educated on the importance of frequent visits to treat obesity as outlined per CMS and USPSTF guidelines and agreed to schedule her next follow up appointment today.  Attestation Statements:   Reviewed by clinician on day of visit: allergies, medications, problem list, medical history, surgical history, family history, social history, and previous encounter notes.   I, Trixie [name], am acting as transcriptionist for Dennard Nip, MD.  I have reviewed the above documentation for accuracy and completeness, and I agree with the above. -  Dennard Nip, MD

## 2022-04-06 ENCOUNTER — Other Ambulatory Visit: Payer: Self-pay

## 2022-04-07 DIAGNOSIS — M5412 Radiculopathy, cervical region: Secondary | ICD-10-CM | POA: Diagnosis not present

## 2022-04-07 DIAGNOSIS — M25511 Pain in right shoulder: Secondary | ICD-10-CM | POA: Diagnosis not present

## 2022-04-07 DIAGNOSIS — M79603 Pain in arm, unspecified: Secondary | ICD-10-CM | POA: Diagnosis not present

## 2022-04-14 DIAGNOSIS — H2512 Age-related nuclear cataract, left eye: Secondary | ICD-10-CM | POA: Diagnosis not present

## 2022-04-15 DIAGNOSIS — H2512 Age-related nuclear cataract, left eye: Secondary | ICD-10-CM | POA: Diagnosis not present

## 2022-04-20 ENCOUNTER — Ambulatory Visit (INDEPENDENT_AMBULATORY_CARE_PROVIDER_SITE_OTHER): Payer: HMO | Admitting: Family Medicine

## 2022-04-28 DIAGNOSIS — Z79899 Other long term (current) drug therapy: Secondary | ICD-10-CM | POA: Diagnosis not present

## 2022-04-28 DIAGNOSIS — M47817 Spondylosis without myelopathy or radiculopathy, lumbosacral region: Secondary | ICD-10-CM | POA: Diagnosis not present

## 2022-04-28 DIAGNOSIS — M150 Primary generalized (osteo)arthritis: Secondary | ICD-10-CM | POA: Diagnosis not present

## 2022-04-28 DIAGNOSIS — G894 Chronic pain syndrome: Secondary | ICD-10-CM | POA: Diagnosis not present

## 2022-05-11 ENCOUNTER — Ambulatory Visit (INDEPENDENT_AMBULATORY_CARE_PROVIDER_SITE_OTHER): Payer: HMO | Admitting: Family Medicine

## 2022-05-11 ENCOUNTER — Encounter (INDEPENDENT_AMBULATORY_CARE_PROVIDER_SITE_OTHER): Payer: Self-pay | Admitting: Family Medicine

## 2022-05-11 VITALS — BP 113/72 | HR 61 | Temp 97.6°F | Ht 64.0 in | Wt 191.0 lb

## 2022-05-11 DIAGNOSIS — E1169 Type 2 diabetes mellitus with other specified complication: Secondary | ICD-10-CM

## 2022-05-11 DIAGNOSIS — E7800 Pure hypercholesterolemia, unspecified: Secondary | ICD-10-CM | POA: Diagnosis not present

## 2022-05-11 DIAGNOSIS — E669 Obesity, unspecified: Secondary | ICD-10-CM | POA: Diagnosis not present

## 2022-05-11 DIAGNOSIS — E559 Vitamin D deficiency, unspecified: Secondary | ICD-10-CM

## 2022-05-11 DIAGNOSIS — Z6832 Body mass index (BMI) 32.0-32.9, adult: Secondary | ICD-10-CM | POA: Diagnosis not present

## 2022-05-11 DIAGNOSIS — E7849 Other hyperlipidemia: Secondary | ICD-10-CM

## 2022-05-11 DIAGNOSIS — E66812 Obesity, class 2: Secondary | ICD-10-CM

## 2022-05-11 NOTE — Progress Notes (Unsigned)
Chief Complaint:   OBESITY Lauren [name] is here to discuss her progress with her obesity treatment plan along with follow-up of her obesity related diagnoses. Lauren [name] is on keeping a food journal and adhering to recommended goals of 1200-1400 calories and 80+ grams of protein daily and states she is following her eating plan approximately 50% of the time. Lauren [name] states she is walking for 15 minutes 3 times per week.  Today's visit was #: 71 Starting weight: 219 lbs Starting date: 04/22/2021 Today's weight: 191 lbs Today's date: 05/11/2022 Total lbs lost to date: 28 Total lbs lost since last in-office visit: 1  Interim History: Lauren [name] continues to do well with weight loss, but she is struggling more with meal planning.  She feels she does better with a structured plan.  Subjective:   1. Type 2 diabetes mellitus with other specified complication, without long-term current use of insulin (HCC) Lauren [name] is working on her diet and exercise, and she is due for labs.  2. Vitamin D deficiency Lauren [name] is on vitamin D, with no side effects noted.  3. Other hyperlipidemia Lauren [name] is on statin and she is working to decrease cholesterol in her diet.  She is due for labs.  Assessment/Plan:   1. Type 2 diabetes mellitus with other specified complication, without long-term current use of insulin (HCC) We will check labs today, and we will follow-up at [place]'s next visit.  - CMP14+EGFR - CBC with Differential/Platelet - Insulin, random - Hemoglobin A1c - TSH  2. Vitamin D deficiency We will check labs today, and we will follow-up at [place]'s next visit.  - VITAMIN D 25 Hydroxy (Vit-D Deficiency, Fractures)  3. Other hyperlipidemia We will check labs today. Lauren [name] will continue to work on diet, exercise and weight loss efforts. Orders and follow up as documented in patient record.   - Lipid Panel With LDL/HDL Ratio  4. Obesity, Current BMI 32.9 Lauren [name] is currently in the action  stage of change. As such, her goal is to continue with weight loss efforts. She has agreed to the Category 2 Plan.   Recipes and meal ideas were discussed.  Exercise goals: As is.   Behavioral modification strategies: decreasing simple carbohydrates and meal planning and cooking strategies.  Lauren [name] has agreed to follow-up with our clinic in 3 to 4 weeks. She was informed of the importance of frequent follow-up visits to maximize her success with intensive lifestyle modifications for her multiple health conditions.   Lauren [name] was informed we would discuss her lab results at her next visit unless there is a critical issue that needs to be addressed sooner. Lauren [name] agreed to keep her next visit at the agreed upon time to discuss these results.  Objective:   Blood pressure 113/72, pulse 61, temperature 97.6 F (36.4 C), height 5' 4" (1.626 m), weight 191 lb (86.6 kg), SpO2 98 %. Body mass index is 32.79 kg/m.  General: Cooperative, alert, well developed, in no acute distress. HEENT: Conjunctivae and lids unremarkable. Cardiovascular: Regular rhythm.  Lungs: Normal work of breathing. Neurologic: No focal deficits.   Lab Results  Component Value Date   CREATININE 0.86 12/10/2021   BUN 16 12/10/2021   NA 135 12/10/2021   K 4.9 12/10/2021   CL 95 (L) 12/10/2021   CO2 25 12/10/2021   Lab Results  Component Value Date   ALT 9 12/10/2021   AST 14 12/10/2021   ALKPHOS 45 12/10/2021   BILITOT 0.2 12/10/2021   Lab Results  Component Value  Date   HGBA1C 5.9 (H) 12/10/2021   HGBA1C 6.7 (H) 04/22/2021   HGBA1C 8.9 (H) 11/13/2018   HGBA1C 7.7 (H) 11/25/2017   HGBA1C 7.2 (H) 08/24/2017   Lab Results  Component Value Date   INSULIN 13.3 12/10/2021   INSULIN 22.3 04/22/2021   Lab Results  Component Value Date   TSH 1.680 04/22/2021   Lab Results  Component Value Date   CHOL 135 12/10/2021   HDL 60 12/10/2021   LDLCALC 59 12/10/2021   TRIG 84 12/10/2021   Lab Results   Component Value Date   VD25OH 86.9 12/10/2021   VD25OH 40.7 04/22/2021   Lab Results  Component Value Date   WBC 7.6 06/04/2021   HGB 11.8 (L) 06/04/2021   HCT 36.9 06/04/2021   MCV 87.0 06/04/2021   PLT 275 06/04/2021   No results found for: "IRON", "TIBC", "FERRITIN"  Attestation Statements:   Reviewed by clinician on day of visit: allergies, medications, problem list, medical history, surgical history, family history, social history, and previous encounter notes.  Time spent on visit including pre-visit chart review and post-visit care and charting was 40 minutes.   I, Sharon [name], am acting as transcriptionist for  , MD.  I have reviewed the above documentation for accuracy and completeness, and I agree with the above. -   , MD   

## 2022-05-12 LAB — LIPID PANEL WITH LDL/HDL RATIO
Cholesterol, Total: 128 mg/dL (ref 100–199)
HDL: 56 mg/dL (ref >39)
LDL Chol Calc (NIH): 56 mg/dL (ref 0–99)
LDL/HDL Ratio: 1 ratio (ref 0.0–3.2)
Triglycerides: 83 mg/dL (ref 0–149)
VLDL Cholesterol Cal: 16 mg/dL (ref 5–40)

## 2022-05-12 LAB — HEMOGLOBIN A1C
Est. average glucose Bld gHb Est-mCnc: 128 mg/dL
Hgb A1c MFr Bld: 6.1 % — ABNORMAL HIGH (ref 4.8–5.6)

## 2022-05-12 LAB — CMP14+EGFR
ALT: 11 IU/L (ref 0–32)
AST: 17 IU/L (ref 0–40)
Albumin/Globulin Ratio: 1.7 (ref 1.2–2.2)
Albumin: 4.4 g/dL (ref 3.8–4.8)
Alkaline Phosphatase: 50 IU/L (ref 44–121)
BUN/Creatinine Ratio: 21 (ref 12–28)
BUN: 17 mg/dL (ref 8–27)
Bilirubin Total: <0.2 mg/dL (ref 0.0–1.2)
CO2: 22 mmol/L (ref 20–29)
Calcium: 9.3 mg/dL (ref 8.7–10.3)
Chloride: 95 mmol/L — ABNORMAL LOW (ref 96–106)
Creatinine, Ser: 0.8 mg/dL (ref 0.57–1.00)
Globulin, Total: 2.6 g/dL (ref 1.5–4.5)
Glucose: 80 mg/dL (ref 70–99)
Potassium: 4.9 mmol/L (ref 3.5–5.2)
Sodium: 135 mmol/L (ref 134–144)
Total Protein: 7 g/dL (ref 6.0–8.5)
eGFR: 76 mL/min/{1.73_m2} (ref >59)

## 2022-05-12 LAB — VITAMIN D 25 HYDROXY (VIT D DEFICIENCY, FRACTURES): Vit D, 25-Hydroxy: 69.3 ng/mL (ref 30.0–100.0)

## 2022-05-12 LAB — CBC WITH DIFFERENTIAL/PLATELET
Basophils Absolute: 0.1 10*3/uL (ref 0.0–0.2)
Basos: 1 %
EOS (ABSOLUTE): 0.3 10*3/uL (ref 0.0–0.4)
Eos: 4 %
Hematocrit: 35.1 % (ref 34.0–46.6)
Hemoglobin: 11.8 g/dL (ref 11.1–15.9)
Immature Grans (Abs): 0 10*3/uL (ref 0.0–0.1)
Immature Granulocytes: 0 %
Lymphocytes Absolute: 1.3 10*3/uL (ref 0.7–3.1)
Lymphs: 23 %
MCH: 29.1 pg (ref 26.6–33.0)
MCHC: 33.6 g/dL (ref 31.5–35.7)
MCV: 87 fL (ref 79–97)
Monocytes Absolute: 0.6 10*3/uL (ref 0.1–0.9)
Monocytes: 10 %
Neutrophils Absolute: 3.6 10*3/uL (ref 1.4–7.0)
Neutrophils: 62 %
Platelets: 265 10*3/uL (ref 150–450)
RBC: 4.05 x10E6/uL (ref 3.77–5.28)
RDW: 13.2 % (ref 11.7–15.4)
WBC: 5.8 10*3/uL (ref 3.4–10.8)

## 2022-05-12 LAB — INSULIN, RANDOM: INSULIN: 11.1 u[IU]/mL (ref 2.6–24.9)

## 2022-05-12 LAB — TSH: TSH: 2.15 u[IU]/mL (ref 0.450–4.500)

## 2022-05-24 ENCOUNTER — Ambulatory Visit (INDEPENDENT_AMBULATORY_CARE_PROVIDER_SITE_OTHER): Payer: HMO | Admitting: Physician Assistant

## 2022-05-24 ENCOUNTER — Encounter: Payer: Self-pay | Admitting: Physician Assistant

## 2022-05-24 DIAGNOSIS — M1711 Unilateral primary osteoarthritis, right knee: Secondary | ICD-10-CM | POA: Diagnosis not present

## 2022-05-24 NOTE — Progress Notes (Signed)
HPI: Mrs. Oka comes in today for right knee pain.  She has known end-stage arthritis of her right knee.  She has tried conservative treatment for many years which is failed to relieve her pain.  However she had significant pain in her knee yesterday and and states that the knee is "making a noise".  No injury.  She is use Voltaren gel and is on chronic hydrocodone for pain mostly her back.  She receives her pain medication from her pain management physician.  Review of systems: See HPI otherwise negative  Physical exam: General well-developed well-nourished female no acute distress mood and affect appropriate. Bilateral knees: Good range of motion both knees.  Left knee with a well-healed surgical incision.  Right knee good range of motion.  No instability valgus varus stressing.  Tenderness along medial joint line right knee.  Slight edema right knee slight effusion no abnormal warmth.  Right calf supple non tender  Impression: Right knee osteoarthritis  Plan: Discussed with her at this point time with not recommend any type of knee arthroscopy.  If she were to have a cortisone injection in her knees she would need to wait at least 2 months before undergoing total knee arthroplasty.  Therefore she would like to proceed with knee arthroplasty in the near future and she has discussed this with Dr. [name] in the past.  She will use a cane when out of the home and also with knee brace.  Follow-up with [place] postop 2 weeks.

## 2022-05-28 DIAGNOSIS — K317 Polyp of stomach and duodenum: Secondary | ICD-10-CM | POA: Diagnosis not present

## 2022-05-28 DIAGNOSIS — R194 Change in bowel habit: Secondary | ICD-10-CM | POA: Diagnosis not present

## 2022-05-28 DIAGNOSIS — Z800 Family history of malignant neoplasm of digestive organs: Secondary | ICD-10-CM | POA: Diagnosis not present

## 2022-06-02 DIAGNOSIS — M47817 Spondylosis without myelopathy or radiculopathy, lumbosacral region: Secondary | ICD-10-CM | POA: Diagnosis not present

## 2022-06-02 DIAGNOSIS — M150 Primary generalized (osteo)arthritis: Secondary | ICD-10-CM | POA: Diagnosis not present

## 2022-06-02 DIAGNOSIS — Z79899 Other long term (current) drug therapy: Secondary | ICD-10-CM | POA: Diagnosis not present

## 2022-06-02 DIAGNOSIS — G894 Chronic pain syndrome: Secondary | ICD-10-CM | POA: Diagnosis not present

## 2022-06-08 ENCOUNTER — Other Ambulatory Visit: Payer: Self-pay

## 2022-06-08 ENCOUNTER — Inpatient Hospital Stay: Payer: HMO | Attending: Hematology and Oncology

## 2022-06-08 ENCOUNTER — Encounter: Payer: Self-pay | Admitting: Hematology and Oncology

## 2022-06-08 ENCOUNTER — Inpatient Hospital Stay (HOSPITAL_BASED_OUTPATIENT_CLINIC_OR_DEPARTMENT_OTHER): Payer: HMO | Admitting: Hematology and Oncology

## 2022-06-08 VITALS — BP 133/50 | HR 64 | Temp 97.4°F | Resp 18 | Ht 64.0 in | Wt 198.0 lb

## 2022-06-08 DIAGNOSIS — Z8042 Family history of malignant neoplasm of prostate: Secondary | ICD-10-CM | POA: Insufficient documentation

## 2022-06-08 DIAGNOSIS — Z853 Personal history of malignant neoplasm of breast: Secondary | ICD-10-CM | POA: Diagnosis not present

## 2022-06-08 DIAGNOSIS — C50411 Malignant neoplasm of upper-outer quadrant of right female breast: Secondary | ICD-10-CM

## 2022-06-08 DIAGNOSIS — E1136 Type 2 diabetes mellitus with diabetic cataract: Secondary | ICD-10-CM | POA: Diagnosis not present

## 2022-06-08 DIAGNOSIS — Z800 Family history of malignant neoplasm of digestive organs: Secondary | ICD-10-CM | POA: Insufficient documentation

## 2022-06-08 DIAGNOSIS — Z803 Family history of malignant neoplasm of breast: Secondary | ICD-10-CM | POA: Insufficient documentation

## 2022-06-08 DIAGNOSIS — Z8051 Family history of malignant neoplasm of kidney: Secondary | ICD-10-CM | POA: Diagnosis not present

## 2022-06-08 DIAGNOSIS — Z87891 Personal history of nicotine dependence: Secondary | ICD-10-CM | POA: Diagnosis not present

## 2022-06-08 DIAGNOSIS — Z170 Estrogen receptor positive status [ER+]: Secondary | ICD-10-CM | POA: Diagnosis not present

## 2022-06-08 DIAGNOSIS — I10 Essential (primary) hypertension: Secondary | ICD-10-CM | POA: Diagnosis not present

## 2022-06-08 LAB — CMP (CANCER CENTER ONLY)
ALT: 12 U/L (ref 0–44)
AST: 16 U/L (ref 15–41)
Albumin: 4.4 g/dL (ref 3.5–5.0)
Alkaline Phosphatase: 50 U/L (ref 38–126)
Anion gap: 6 (ref 5–15)
BUN: 17 mg/dL (ref 8–23)
CO2: 30 mmol/L (ref 22–32)
Calcium: 9.3 mg/dL (ref 8.9–10.3)
Chloride: 98 mmol/L (ref 98–111)
Creatinine: 0.87 mg/dL (ref 0.44–1.00)
GFR, Estimated: >60 mL/min
Glucose, Bld: 126 mg/dL — ABNORMAL HIGH (ref 70–99)
Potassium: 3.6 mmol/L (ref 3.5–5.1)
Sodium: 134 mmol/L — ABNORMAL LOW (ref 135–145)
Total Bilirubin: 0.4 mg/dL (ref 0.3–1.2)
Total Protein: 7.5 g/dL (ref 6.5–8.1)

## 2022-06-08 LAB — CBC WITH DIFFERENTIAL (CANCER CENTER ONLY)
Abs Immature Granulocytes: 0.03 10*3/uL (ref 0.00–0.07)
Basophils Absolute: 0.1 10*3/uL (ref 0.0–0.1)
Basophils Relative: 1 %
Eosinophils Absolute: 0.3 10*3/uL (ref 0.0–0.5)
Eosinophils Relative: 4 %
HCT: 36.5 % (ref 36.0–46.0)
Hemoglobin: 12.3 g/dL (ref 12.0–15.0)
Immature Granulocytes: 0 %
Lymphocytes Relative: 24 %
Lymphs Abs: 1.6 10*3/uL (ref 0.7–4.0)
MCH: 29.8 pg (ref 26.0–34.0)
MCHC: 33.7 g/dL (ref 30.0–36.0)
MCV: 88.4 fL (ref 80.0–100.0)
Monocytes Absolute: 0.8 10*3/uL (ref 0.1–1.0)
Monocytes Relative: 12 %
Neutro Abs: 4 10*3/uL (ref 1.7–7.7)
Neutrophils Relative %: 59 %
Platelet Count: 272 10*3/uL (ref 150–400)
RBC: 4.13 MIL/uL (ref 3.87–5.11)
RDW: 13.8 % (ref 11.5–15.5)
WBC Count: 6.8 10*3/uL (ref 4.0–10.5)
nRBC: 0 % (ref 0.0–0.2)

## 2022-06-08 NOTE — Progress Notes (Signed)
[place]  [phone] [phone]) [phone]     ID: Lauren [name] DOB: [birth year]  MR#: [id number]  GOV#:[id number]  Patient Care Team: Lawerance Cruel, MD as PCP - General (Family Medicine) Skeet Latch, MD as PCP - Cardiology (Cardiology) Mcarthur Rossetti, MD as Consulting Physician (Orthopedic Surgery) Magrinat, Virgie Dad, MD (Inactive) as Consulting Physician (Oncology) Kyung Rudd, MD as Consulting Physician (Radiation Oncology) Huel Cote, NP (Inactive) as Nurse Practitioner (Obstetrics and Gynecology) Clarene Essex, MD as Consulting Physician (Gastroenterology) Coralie Keens, MD as Consulting Physician (General Surgery) Starlyn Skeans, MD as Consulting Physician (Family Medicine) OTHER MD:  CHIEF COMPLAINT: Estrogen receptor positive breast cancer  CURRENT TREATMENT: Observation  INTERVAL HISTORY:  Lauren [name] returns today for follow-up of her estrogen receptor positive breast cancer. She is now under observation alone. Since last visit she had a mammogram on January 04, 2022 with no findings suspicious for malignancy, her breast density is category C She is continuing to eat healthy, walk daily.  She recently had knee replacement and is working with physical therapy.  She also lost her husband last year with congestive heart failure.  She denies any breast changes.  Rest of the pertinent 10 point ROS reviewed and negative   COVID 19 VACCINATION STATUS: [place] x2   HISTORY OF CURRENT ILLNESS: From the original intake note:  "Lauren [name]" had routine screening mammography on 10/04/2018 showing a possible abnormality in the right breast. She underwent unilateral right diagnostic mammography with tomography and right breast ultrasonography at [facility] on 10/12/2018 showing: Breast Density Category C. Spot compression tomosynthesis images through the upper outer quadrant of the right breast demonstrates a persistent irregular mass  with indistinct margins measuring approximately 1 cm. On physical exam, no definite palpable mass is identified in the upper-outer right breast. Targeted ultrasound is performed, showing an irregular hypoechoic shadowing mass at 10:30 oclock, 6 cm from the nipple measuring 9 x 8 x 9 mm. Ultrasound of the right axilla demonstrates multiple normal-appearing lymph nodes.  Accordingly on 10/18/2018 she proceeded to biopsy of the right breast area in question. The pathology from this procedure showed ([id number]): invasive ductal carcinoma, grade II. Prognostic indicators significant for: estrogen receptor, 100% positive and progesterone receptor, 20% positive, both with strong staining intensity. Proliferation marker Ki67 at 5%. HER2 equivocal (2+) by immunohistochemisty but negative by fluorescent in situ hybridization with a signals ratio 1.26 and number per cell 2.40.   The patient's subsequent history is as detailed below.   PAST MEDICAL HISTORY: Past Medical History:  Diagnosis Date   Anxiety    Arthritis    Back, Hip- right   Atypical chest pain 06/25/2021   Back pain    Cancer ([place])    right breast   Cataract    Colitis    Coronary artery calcification 02/20/2020   Diabetes mellitus without complication (HCC)    Type II   Edema 03/05/2021   Edema of both lower extremities    Endometriosis    Family history of adverse reaction to anesthesia    Father - N/V - blood pressure; daughter N/V   Family history of breast cancer    Family history of colon cancer    Family history of kidney cancer    Family history of prostate cancer    Fatty liver    GERD (gastroesophageal reflux disease)    History of kidney stones    History of ulcerative colitis    Hypertension  Insomnia    Joint pain    Kidney problem    Lower extremity edema 03/05/2021   Lumbar disc disease    L4 L5   Neuropathy    Osteoarthritis    Other hyperlipidemia    Palpitations    Personal history of radiation  therapy    Pneumonia    1983, 2003   Pure hypercholesterolemia 02/20/2020   PVCs (premature ventricular contractions)    benign   Umbilical hernia    Vitamin D deficiency    Wears glasses     PAST SURGICAL HISTORY: Past Surgical History:  Procedure Laterality Date   APPENDECTOMY     BREAST BIOPSY  08-13-2009  DR TSUEI   EXCISION LEFT NIPPLE DUCT   BREAST EXCISIONAL BIOPSY Left    x2   BREAST EXCISIONAL BIOPSY Right    BREAST LUMPECTOMY Right 11/16/2018   invasive ductal   CHOLECYSTECTOMY  1992   COLON RESECTION  1986   ULCERATIVE COLITIS   COLON SURGERY     ESOPHAGEAL MANOMETRY N/A 06/11/2013   Procedure: ESOPHAGEAL MANOMETRY (EM);  Surgeon: Jeryl Columbia, MD;  Location: [facility];  Service: Endoscopy;  Laterality: N/A;   ESOPHAGOGASTRODUODENOSCOPY (EGD) WITH PROPOFOL N/A 05/31/2016   Procedure: ESOPHAGOGASTRODUODENOSCOPY (EGD) WITH PROPOFOL;  Surgeon: Clarene Essex, MD;  Location: [facility] ENDOSCOPY;  Service: Endoscopy;  Laterality: N/A;   EXCISION RIGHT BREAST MASS   10-20-2005  DR Collier Salina YOUNG   FRACTURE SURGERY     left arm   kidney stone removed  2/11   KNEE ARTHROSCOPY Left    MCL   LEFT THUMB CARPOMETACARPAL JOINT SUSPENSIONPLASTY  04-06-2006  DR Burney Gauze   LEFT WRIST ARTHROSCOPY W/ DEBRIDEMENT AND REMOVAL CYST  03-26-2004  DR Burney Gauze   MYOMECTOMY     RADIOACTIVE SEED GUIDED PARTIAL MASTECTOMY WITH AXILLARY SENTINEL LYMPH NODE BIOPSY Right 11/16/2018   Procedure: RIGHT BREAST RADIOACTIVE SEED GUIDED PARTIAL MASTECTOMY WITH  SENTINEL  NODE BIOPSY;  Surgeon: Coralie Keens, MD;  Location: [facility];  Service: General;  Laterality: Right;   RIGHT COLECTOMY     RIGHT URETEROSCOPIC STONE EXTRACTION  12-11-2009  DR [place] HIP ARTHROPLASTY Right 11/26/2016   Procedure: RIGHT TOTAL HIP ARTHROPLASTY ANTERIOR APPROACH;  Surgeon: Mcarthur Rossetti, MD;  Location: [facility];  Service: Orthopedics;  Laterality: Right;   TOTAL KNEE  ARTHROPLASTY Left 12/02/2017   Procedure: LEFT TOTAL KNEE ARTHROPLASTY;  Surgeon: Mcarthur Rossetti, MD;  Location: [facility];  Service: Orthopedics;  Laterality: Left;   WRIST SURGERY     both    FAMILY HISTORY: Family History  Problem Relation Age of Onset   Hyperlipidemia Mother    Diabetes Mother    Hypertension Mother    Parkinson's disease Mother    Kidney disease Mother    Kidney disease Father    Stroke Father    Heart failure Father    Hypertension Father    Heart disease Father    Diabetes Father    Cancer Father        colon, prostate, and kidney   Heart disease Maternal Grandmother    Diabetes Maternal Grandmother    Heart disease Maternal Grandfather    Diabetes Maternal Grandfather    Cancer Maternal Grandfather        pt unaware of what kind   Stroke Paternal Grandmother    Stroke Paternal Grandfather    Cancer Maternal Aunt  cervical vs ovarian   Colon cancer Paternal Aunt        dx less than 56   Breast cancer Paternal Aunt        dx in her 26s   Prostate cancer Paternal Uncle    Breast cancer Cousin        pat first cousin, dx over 35   Prostate cancer Other        PGF's father  Cindy's father died from colon cancer at age 70; he was diagnosed with prostate cancer at 50, and he also had kidney cancer. Patients' mother died from failure to thrive at age 30. The patient has no siblings. Lauren [name] had a paternal aunt that was diagnosed with breast cancer in her mid 81's. Lauren [name] also has a paternal great grandfather that was diagnosed with prostate cancer. Patient denies anyone in her family having ovarian cancer.    GYNECOLOGIC HISTORY:  No LMP recorded. Patient is postmenopausal. 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 Her husband, [name],  ran a Building surveyor. Together, they have two children, Richard and [employer]. Their son, Delfino Lovett, lives in [place] and is disabled (MS). Their daughter, [name], is a Physiological scientist (and also has MS). Lauren [name] has 5 grandchildren, no great grandchildren. She attends a [employer].    ADVANCED DIRECTIVES: In the absence of any documentation to the contrary, the patient's spouse is their HCPOA.    HEALTH MAINTENANCE: Social History   Tobacco Use   Smoking status: Former    Packs/day: 1.00    Years: 10.00    Total pack years: 10.00    Types: Cigarettes    Quit date: 09/22/1982    Years since quitting: 39.7   Smokeless tobacco: Never   Tobacco comments:    quit 1983  Vaping Use   Vaping Use: Never used  Substance Use Topics   Alcohol use: No   Drug use: No    Colonoscopy: yes, 3 or 4 years ago  PAP: Stopped at 36; [facility] Alas, [facility]  Bone density: 12/2019, -1.7  Allergies  Allergen Reactions   Ciprofloxacin Hcl Hives   Percocet [Oxycodone-Acetaminophen] Other (See Comments)    RESPIRATORY DISTRESS   Penicillins Hives and Other (See Comments)    Has patient had a PCN reaction causing immediate rash, facial/tongue/throat swelling, SOB or lightheadedness with hypotension: no Has patient had a PCN reaction causing severe rash involving mucus membranes or skin necrosis: no Has patient had a PCN reaction that required hospitalization no Has patient had a PCN reaction occurring within the last 10 years: no If all of the above answers are "NO", then may proceed with Cephalosporin use.   Sulfa Antibiotics Hives   Tape Other (See Comments)    Adhesives - Causes Blisters. No band aids, and plastic tape    Tetanus Toxoids Hives and Rash   Jardiance [Empagliflozin]     Current Outpatient Medications  Medication Sig Dispense Refill   aspirin EC 81 MG tablet Take 81 mg by mouth daily.     b complex vitamins tablet Take 1 tablet by  mouth daily.     balsalazide (COLAZAL) 750 MG capsule Take 2,250 mg by mouth 2 (two) times daily.     chlorthalidone (HYGROTON)  25 MG tablet TAKE ONE TABLET BY MOUTH ONCE DAILY 90 tablet 0   Cholecalciferol (VITAMIN D3) 125 MCG (5000 UT) CAPS 1 capsule     cholestyramine (QUESTRAN) 4 GM/DOSE powder Take 4 g by mouth daily.      gabapentin (NEURONTIN) 300 MG capsule Take 600 mg by mouth at bedtime.   1   HYDROcodone-acetaminophen (NORCO) 10-325 MG tablet Take by mouth.     Lancets (ONETOUCH DELICA PLUS [id number]) MISC      lidocaine (LMX) 4 % cream Apply 1 application topically daily as needed (for joint pain).      lisinopril (ZESTRIL) 40 MG tablet      methocarbamol (ROBAXIN) 500 MG tablet Take 1 tablet (500 mg total) by mouth every 6 (six) hours as needed for muscle spasms. (Patient taking differently: Take 500 mg by mouth at bedtime.) 60 tablet 0   ONETOUCH VERIO test strip      Oxymetazoline HCl (NASAL SPRAY NA) Place 1-2 sprays into both nostrils at bedtime as needed (for allergies).     pantoprazole (PROTONIX) 40 MG tablet Take 40 mg by mouth 2 (two) times daily.     pioglitazone (ACTOS) 30 MG tablet      rosuvastatin (CRESTOR) 20 MG tablet TAKE 1 TABLET BY MOUTH EVERY DAY 90 tablet 1   Vitamin D, Ergocalciferol, (DRISDOL) 1.25 MG (50000 UNIT) CAPS capsule Take 1 capsule (50,000 Units total) by mouth every 7 (seven) days. 4 capsule 0   No current facility-administered medications for this visit.    OBJECTIVE:  white woman who appears stated age  There were no vitals filed for this visit.     There is no height or weight on file to calculate BMI.   Wt Readings from Last 3 Encounters:  05/11/22 191 lb (86.6 kg)  03/22/22 192 lb (87.1 kg)  02/18/22 188 lb (85.3 kg)      ECOG FS:2 - Symptomatic, <50% confined to bed  Physical Exam Constitutional:      Appearance: Normal appearance.  Chest:     Comments: Bilateral breasts inspected and palpated.  No masses or regional  adenopathy. Musculoskeletal:     Cervical back: Normal range of motion. No rigidity.  Lymphadenopathy:     Cervical: No cervical adenopathy.  Neurological:     Mental Status: She is alert.       LAB RESULTS:  CMP     Component Value Date/Time   NA 135 05/11/2022 0954   K 4.9 05/11/2022 0954   CL 95 (L) 05/11/2022 0954   CO2 22 05/11/2022 0954   GLUCOSE 80 05/11/2022 0954   GLUCOSE 145 (H) 06/04/2021 1032   BUN 17 05/11/2022 0954   CREATININE 0.80 05/11/2022 0954   CREATININE 0.96 11/09/2018 1405   CALCIUM 9.3 05/11/2022 0954   PROT 7.0 05/11/2022 0954   ALBUMIN 4.4 05/11/2022 0954   AST 17 05/11/2022 0954   AST 34 11/09/2018 1405   ALT 11 05/11/2022 0954   ALT 24 11/09/2018 1405   ALKPHOS 50 05/11/2022 0954   BILITOT <0.2 05/11/2022 0954   BILITOT 0.4 11/09/2018 1405   GFRNONAA >60 06/04/2021 1032   GFRNONAA 59 (L) 11/09/2018 1405   GFRAA >60 02/04/2020 1212   GFRAA >60 11/09/2018 1405    No results found for: "TOTALPROTELP", "ALBUMINELP", "A1GS", "A2GS", "BETS", "BETA2SER", "GAMS", "MSPIKE", "SPEI"  No results found for: "KPAFRELGTCHN", "LAMBDASER", "KAPLAMBRATIO"  Lab Results  Component Value Date   WBC 5.8 05/11/2022   NEUTROABS 3.6 05/11/2022  HGB 11.8 05/11/2022   HCT 35.1 05/11/2022   MCV 87 05/11/2022   PLT 265 05/11/2022   No results found for: "LABCA2"  No components found for: "MMHWKG881"  No results for input(s): "INR" in the last 168 hours.  No results found for: "LABCA2"  No results found for: "JSR159"  No results found for: "CAN125"  No results found for: "CAN153"  No results found for: "CA2729"  No components found for: "HGQUANT"  No results found for: "CEA1", "CEA" / No results found for: "CEA1", "CEA"   No results found for: "AFPTUMOR"  No results found for: "CHROMOGRNA"  No results found for: "HGBA", "HGBA2QUANT", "HGBFQUANT", "HGBSQUAN" (Hemoglobinopathy evaluation)   No results found for: "LDH"  No results found  for: "IRON", "TIBC", "IRONPCTSAT" (Iron and TIBC)  No results found for: "FERRITIN"  Urinalysis    Component Value Date/Time   COLORURINE YELLOW 09/19/2018 [place] 09/19/2018 1143   LABSPEC 1.010 09/19/2018 1143   PHURINE 6.0 09/19/2018 1143   GLUCOSEU 3+ (A) 09/19/2018 1143   HGBUR TRACE (A) 09/19/2018 1143   BILIRUBINUR NEGATIVE 11/05/2015 [place] 09/19/2018 1143   PROTEINUR NEGATIVE 09/19/2018 1143   UROBILINOGEN 0.2 11/28/2013 1456   NITRITE NEGATIVE 11/05/2015 1512   LEUKOCYTESUR 1+ (A) 11/05/2015 1512    STUDIES:  No results found.   ELIGIBLE FOR AVAILABLE RESEARCH PROTOCOL: no   ASSESSMENT: 76 y.o. [place], [place] woman status post right breast upper outer quadrant biopsy 10/18/2018 for a clinical T1b N0, stage IA invasive ductal carcinoma, grade 2, estrogen and progesterone receptor positive, MIB-1 5%, with no HER-2 amplification by FISH  (1) right lumpectomy and sentinel lymph node sampling 11/16/2018 showed a pT1c pN0, stage IA invasive ductal carcinoma, grade 2, with negative margins  (a) a single sentinel lymph node was removed  (2) Oncotype score of 12 predicted a risk of recurrence outside the breast over the next 9 years of only 3% if the patient's only systemic treatment was antiestrogens for 5 years.  No significant benefit was anticipated from chemotherapy.  (a) Oncotype also read tumor as HER-2 negative  (3) adjuvant radiation 01/02/2019 to 01/29/2019. Site/dose:   The patient initially received a dose of 42.56 Gy in 16 fractions to the right breast using whole-breast tangent fields. This was delivered using a 3-D conformal technique. The patient then received a boost. This delivered an additional 8 Gy in 4 fractions using a 3 field photon technique. The total dose was 50.56 Gy.  (4) anastrozole started 02/13/2019  (a) bone density 01/06/2012 was normal  (b) anastrozole discontinued April 2021, with  arthralgias/myalgias  (5) genetics testing 11/27/2018 through the Multi-Gene Panel offered by [facility] found no deleterious mutations in AIP, ALK, APC, ATM, AXIN2,BAP1,  BARD1, BLM, BMPR1A, BRCA1, BRCA2, BRIP1, CASR, CDC73, CDH1, CDK4, CDKN1B, CDKN1C, CDKN2A (p14ARF), CDKN2A (p16INK4a), CEBPA, CHEK2, CTNNA1, DICER1, DIS3L2, EGFR (c.2369C>T, p.Thr790Met variant only), EPCAM (Deletion/duplication testing only), FH, FLCN, GATA2, GPC3, GREM1 (Promoter region deletion/duplication testing only), HOXB13 (c.251G>A, p.Gly84Glu), HRAS, KIT, MAX, MEN1, MET, MITF (c.952G>A, p.Glu318Lys variant only), MLH1, MSH2, MSH3, MSH6, MUTYH, NBN, NF1, NF2, NTHL1, PALB2, PDGFRA, PHOX2B, PMS2, POLD1, POLE, POT1, PRKAR1A, PTCH1, PTEN, RAD50, RAD51C, RAD51D, RB1, RECQL4, RET, RUNX1, SDHAF2, SDHA (sequence changes only), SDHB, SDHC, SDHD, SMAD4, SMARCA4, SMARCB1, SMARCE1, STK11, SUFU, TERC, TERT, TMEM127, TP53, TSC1, TSC2, VHL, WRN and WT1.    (a) a variant of uncertain significant was found in DICER1, c.2118C>T (silent)  (6) exemestane prescribed 04/03/2020--could not obtain secondary  to cost  (a) started letrozole Jun 2021, discontinued because of intolerable side effects  (b) opted against tamoxifen (see discussion 08/05/2020 visit)   PLAN:  Ms. Kendrah is currently on observation alone.  She could not tolerate aromatase inhibitors. She is doing well, lost her husband last year to CHF exacerbation. Physical examination today, no concerns.  She will proceed with mammogram next year and return to clinic in 1 year.  She is advised to continue exercising as recommended by orthopedic team.  She should call us with new questions or concerns. Age-appropriate cancer screening recommended Total encounter time 30 minutes.*  *Total Encounter Time as defined by the Centers for Medicare and Medicaid Services includes, in addition to the face-to-face time of a patient visit (documented in the note above) non-face-to-face time: obtaining  and reviewing outside history, ordering and reviewing medications, tests or procedures, care coordination (communications with other health care professionals or caregivers) and documentation in the medical record.

## 2022-06-09 ENCOUNTER — Encounter (INDEPENDENT_AMBULATORY_CARE_PROVIDER_SITE_OTHER): Payer: Self-pay

## 2022-06-09 ENCOUNTER — Ambulatory Visit (INDEPENDENT_AMBULATORY_CARE_PROVIDER_SITE_OTHER): Payer: HMO | Admitting: Family Medicine

## 2022-06-10 ENCOUNTER — Encounter: Payer: HMO | Admitting: Orthopaedic Surgery

## 2022-06-10 DIAGNOSIS — C7A092 Malignant carcinoid tumor of the stomach: Secondary | ICD-10-CM | POA: Diagnosis not present

## 2022-06-10 DIAGNOSIS — Z800 Family history of malignant neoplasm of digestive organs: Secondary | ICD-10-CM | POA: Diagnosis not present

## 2022-06-10 DIAGNOSIS — Z980 Intestinal bypass and anastomosis status: Secondary | ICD-10-CM | POA: Diagnosis not present

## 2022-06-10 DIAGNOSIS — D3A092 Benign carcinoid tumor of the stomach: Secondary | ICD-10-CM | POA: Diagnosis not present

## 2022-06-10 DIAGNOSIS — K449 Diaphragmatic hernia without obstruction or gangrene: Secondary | ICD-10-CM | POA: Diagnosis not present

## 2022-06-10 DIAGNOSIS — K5730 Diverticulosis of large intestine without perforation or abscess without bleeding: Secondary | ICD-10-CM | POA: Diagnosis not present

## 2022-06-10 DIAGNOSIS — K317 Polyp of stomach and duodenum: Secondary | ICD-10-CM | POA: Diagnosis not present

## 2022-06-10 DIAGNOSIS — D3A8 Other benign neuroendocrine tumors: Secondary | ICD-10-CM | POA: Diagnosis not present

## 2022-06-10 DIAGNOSIS — K2950 Unspecified chronic gastritis without bleeding: Secondary | ICD-10-CM | POA: Diagnosis not present

## 2022-06-10 DIAGNOSIS — K649 Unspecified hemorrhoids: Secondary | ICD-10-CM | POA: Diagnosis not present

## 2022-06-16 ENCOUNTER — Ambulatory Visit (INDEPENDENT_AMBULATORY_CARE_PROVIDER_SITE_OTHER): Payer: HMO | Admitting: Family Medicine

## 2022-06-16 ENCOUNTER — Encounter (INDEPENDENT_AMBULATORY_CARE_PROVIDER_SITE_OTHER): Payer: Self-pay | Admitting: Family Medicine

## 2022-06-16 ENCOUNTER — Other Ambulatory Visit: Payer: Self-pay

## 2022-06-16 VITALS — BP 118/71 | HR 62 | Temp 97.8°F | Ht 64.0 in | Wt 193.0 lb

## 2022-06-16 DIAGNOSIS — E559 Vitamin D deficiency, unspecified: Secondary | ICD-10-CM

## 2022-06-16 DIAGNOSIS — E1169 Type 2 diabetes mellitus with other specified complication: Secondary | ICD-10-CM | POA: Diagnosis not present

## 2022-06-16 DIAGNOSIS — E669 Obesity, unspecified: Secondary | ICD-10-CM | POA: Diagnosis not present

## 2022-06-16 DIAGNOSIS — Z6833 Body mass index (BMI) 33.0-33.9, adult: Secondary | ICD-10-CM | POA: Diagnosis not present

## 2022-06-16 DIAGNOSIS — E7849 Other hyperlipidemia: Secondary | ICD-10-CM | POA: Diagnosis not present

## 2022-06-16 MED ORDER — VITAMIN D (ERGOCALCIFEROL) 1.25 MG (50000 UNIT) PO CAPS: 50000.0000 [IU] | ORAL_CAPSULE | ORAL | 0 refills | Status: DC

## 2022-06-18 DIAGNOSIS — D3A8 Other benign neuroendocrine tumors: Secondary | ICD-10-CM | POA: Diagnosis not present

## 2022-06-23 NOTE — Progress Notes (Signed)
Chief Complaint:   OBESITY Lauren [name] is here to discuss her progress with her obesity treatment plan along with follow-up of her obesity related diagnoses. Lauren [name] is on the Category 2 Plan and states she is following her eating plan approximately 50% of the time. Lauren [name] states she is doing 0 minutes 0 times per week.  Today's visit was #: 18 Starting weight: 219 lbs Starting date: 04/22/2021 Today's weight: 193 lbs Today's date: 8//2023 Total lbs lost to date: 26 Total lbs lost since last in-office visit: 0  Interim History: Lauren [name] has been dealing with some health issues and she hasn't been able to follow her eating plan as closely.   Subjective:   1. Type 2 diabetes mellitus with other specified complication, without long-term current use of insulin (HCC) Lauren [name] A1c and insulin level is at goal. She is doing well with her diet overall. She has no signs of hypoglycemia. I discussed labs with the patient today.   2. Vitamin D deficiency Lauren [name]'s Vitamin D is now at goal. I discussed labs with the patient today.   3. Other hyperlipidemia Lauren [name] is on statin and her cholesterol is at goal. She denies chest pain or myalgias. I discussed labs with the patient today.   Assessment/Plan:   1. Type 2 diabetes mellitus with other specified complication, without long-term current use of insulin (HCC) Lauren [name] will continue to decrease simple carbohydrate, and we will continue to follow.  2. Vitamin D deficiency We will refill prescription Vitamin D for 1 month. Lauren [name] will follow-up for routine testing of Vitamin D, at least 2-3 times per year to avoid over-replacement.  - Vitamin D, Ergocalciferol, (DRISDOL) 1.25 MG (50000 UNIT) CAPS capsule; Take 1 capsule (50,000 Units total) by mouth every 7 (seven) days.  Dispense: 4 capsule; Refill: 0  3. Other hyperlipidemia Lauren [name] will continue to work on diet, exercise and weight loss efforts. Orders and follow up as documented in  patient record.   4. Obesity with current BMI 33.2 Lauren [name] is currently in the action stage of change. As such, her goal is to continue with weight loss efforts. She has agreed to the Category 2 Plan.   Behavioral modification strategies: increasing lean protein intake.  Lauren [name] has agreed to follow-up with our clinic in 4 weeks. She was informed of the importance of frequent follow-up visits to maximize her success with intensive lifestyle modifications for her multiple health conditions.   Objective:   Blood pressure 118/71, pulse 62, temperature 97.8 F (36.6 C), height '5\' 4"'$  (1.626 m), weight 193 lb (87.5 kg), SpO2 97 %. Body mass index is 33.13 kg/m.  General: Cooperative, alert, well developed, in no acute distress. HEENT: Conjunctivae and lids unremarkable. Cardiovascular: Regular rhythm.  Lungs: Normal work of breathing. Neurologic: No focal deficits.   Lab Results  Component Value Date   CREATININE 0.87 06/08/2022   BUN 17 06/08/2022   NA 134 (L) 06/08/2022   K 3.6 06/08/2022   CL 98 06/08/2022   CO2 30 06/08/2022   Lab Results  Component Value Date   ALT 12 06/08/2022   AST 16 06/08/2022   ALKPHOS 50 06/08/2022   BILITOT 0.4 06/08/2022   Lab Results  Component Value Date   HGBA1C 6.1 (H) 05/11/2022   HGBA1C 5.9 (H) 12/10/2021   HGBA1C 6.7 (H) 04/22/2021   HGBA1C 8.9 (H) 11/13/2018   HGBA1C 7.7 (H) 11/25/2017   Lab Results  Component Value Date   INSULIN 11.1 05/11/2022   INSULIN 13.3  12/10/2021   INSULIN 22.3 04/22/2021   Lab Results  Component Value Date   TSH 2.150 05/11/2022   Lab Results  Component Value Date   CHOL 128 05/11/2022   HDL 56 05/11/2022   LDLCALC 56 05/11/2022   TRIG 83 05/11/2022   Lab Results  Component Value Date   VD25OH 69.3 05/11/2022   VD25OH 86.9 12/10/2021   VD25OH 40.7 04/22/2021   Lab Results  Component Value Date   WBC 6.8 06/08/2022   HGB 12.3 06/08/2022   HCT 36.5 06/08/2022   MCV 88.4 06/08/2022    PLT 272 06/08/2022   No results found for: "IRON", "TIBC", "FERRITIN"  Attestation Statements:   Reviewed by clinician on day of visit: allergies, medications, problem list, medical history, surgical history, family history, social history, and previous encounter notes.   I, Trixie [name], am acting as transcriptionist for Dennard Nip, MD.  I have reviewed the above documentation for accuracy and completeness, and I agree with the above. -  Dennard Nip, MD

## 2022-07-01 ENCOUNTER — Telehealth: Payer: Self-pay | Admitting: Orthopaedic Surgery

## 2022-07-01 NOTE — Telephone Encounter (Signed)
Patient aware.

## 2022-07-01 NOTE — Telephone Encounter (Signed)
Patient called asked if she can have her teeth cleaned prior to her surgery? Patient  also asked how soon after surgery can she have her teeth cleaned? The number to contact patient is [phone]

## 2022-07-06 ENCOUNTER — Other Ambulatory Visit (HOSPITAL_BASED_OUTPATIENT_CLINIC_OR_DEPARTMENT_OTHER): Payer: Self-pay | Admitting: Cardiovascular Disease

## 2022-07-06 NOTE — Telephone Encounter (Signed)
Rx request sent to pharmacy.  

## 2022-07-14 DIAGNOSIS — Z79891 Long term (current) use of opiate analgesic: Secondary | ICD-10-CM | POA: Diagnosis not present

## 2022-07-14 DIAGNOSIS — G894 Chronic pain syndrome: Secondary | ICD-10-CM | POA: Diagnosis not present

## 2022-07-14 DIAGNOSIS — M150 Primary generalized (osteo)arthritis: Secondary | ICD-10-CM | POA: Diagnosis not present

## 2022-07-14 DIAGNOSIS — M47817 Spondylosis without myelopathy or radiculopathy, lumbosacral region: Secondary | ICD-10-CM | POA: Diagnosis not present

## 2022-07-14 DIAGNOSIS — Z79899 Other long term (current) drug therapy: Secondary | ICD-10-CM | POA: Diagnosis not present

## 2022-07-19 ENCOUNTER — Encounter (INDEPENDENT_AMBULATORY_CARE_PROVIDER_SITE_OTHER): Payer: Self-pay | Admitting: Family Medicine

## 2022-07-19 ENCOUNTER — Telehealth (INDEPENDENT_AMBULATORY_CARE_PROVIDER_SITE_OTHER): Payer: HMO | Admitting: Family Medicine

## 2022-07-19 VITALS — Wt 194.0 lb

## 2022-07-19 DIAGNOSIS — E669 Obesity, unspecified: Secondary | ICD-10-CM

## 2022-07-19 DIAGNOSIS — Z6832 Body mass index (BMI) 32.0-32.9, adult: Secondary | ICD-10-CM

## 2022-07-19 DIAGNOSIS — F439 Reaction to severe stress, unspecified: Secondary | ICD-10-CM | POA: Insufficient documentation

## 2022-07-19 DIAGNOSIS — K5909 Other constipation: Secondary | ICD-10-CM | POA: Insufficient documentation

## 2022-07-19 NOTE — Progress Notes (Deleted)
Office: [phone]  /  Fax: [phone]  Initial Visit  SHANYLA MARCONI was seen in clinic today to evaluate for obesity. Pt referred by:  ***.  Patient past medical history includes:   Past Medical History:  Diagnosis Date   Anxiety    Arthritis    Back, Hip- right   Atypical chest pain 06/25/2021   Back pain    Cancer ([place])    right breast   Cataract    Colitis    Coronary artery calcification 02/20/2020   Diabetes mellitus without complication (HCC)    Type II   Edema 03/05/2021   Edema of both lower extremities    Endometriosis    Family history of adverse reaction to anesthesia    Father - N/V - blood pressure; daughter N/V   Family history of breast cancer    Family history of colon cancer    Family history of kidney cancer    Family history of prostate cancer    Fatty liver    GERD (gastroesophageal reflux disease)    History of kidney stones    History of ulcerative colitis    Hypertension    Insomnia    Joint pain    Kidney problem    Lower extremity edema 03/05/2021   Lumbar disc disease    L4 L5   Neuropathy    Osteoarthritis    Other hyperlipidemia    Palpitations    Personal history of radiation therapy    Pneumonia    1983, 2003   Pure hypercholesterolemia 02/20/2020   PVCs (premature ventricular contractions)    benign   Umbilical hernia    Vitamin D deficiency    Wears glasses   .  Pt here to review program options for treatment, initial physical assessment, and determine program plan.    Objective:   Weight 194 lb (88 kg). She was weighed on the bioimpedance scale:  Body mass index is 33.3 kg/m.  General:  Alert, oriented and cooperative. Patient is in no acute distress.  Respiratory: Normal respiratory effort, no problems with respiration noted  Extremities: Normal range of motion.    Mental Status: Normal mood and affect. Normal behavior. Normal judgment and thought content.   Assessment and Plan:  Constipation due to slow  transit  Type 2 diabetes mellitus with diabetic neuropathy, without long-term current use of insulin (HCC)  Pure hypercholesterolemia  Vitamin D deficiency  Class 2 severe obesity with serious comorbidity and body mass index (BMI) of 35.0 to 35.9 in adult, unspecified obesity type ([place])  1. Constipation due to slow transit ***  2. Type 2 diabetes mellitus with diabetic neuropathy, without long-term current use of insulin (HCC) ***  3. Pure hypercholesterolemia ***  4. Vitamin D deficiency ***  5. Class 2 severe obesity with serious comorbidity and body mass index (BMI) of 35.0 to 35.9 in adult, unspecified obesity type (HCC) ***    Obesity education preformed today:  We discussed obesity as a disease and the importance of a more detailed evaluation of all the factors contributing to the disease.  We discussed the importance of long term lifestyle changes which include nutrition, exercise and behavioral modifications as well as the importance of customizing this to her specific health and social needs.  We discussed the benefits of reaching a healthier weight to alleviate the symptoms or reduce the risks of biomechanical, metabolic and psychological effects of obesity.  Current health conditions we discussed, and we expect to improve include:  Constipation due to slow transit  Type 2 diabetes mellitus with diabetic neuropathy, without long-term current use of insulin (HCC)  Pure hypercholesterolemia  Vitamin D deficiency  Class 2 severe obesity with serious comorbidity and body mass index (BMI) of 35.0 to 35.9 in adult, unspecified obesity type ([place])  We discussed the goals of this program is to improve her overall health and not simply achieve a specific BMI.  Frequent visits are very important to patient success. I plan to see her every 2 weeks for the first 3 months and then evaluate the visit frequency after that time. I explained obesity is a life-long chronic  disease and long term treatments would be required. Medications to help her follow his eating plan may be offered as appropriate but are not required. All medication decisions will be made together after the initial workup is done and benefits and side effects are discussed in depth.  The clinic rules were reviewed including the late policy, cancellation policy, no show and program fees.  DEMETRIA IWAI appears to be in the action stage of change and states they are ready to start intensive lifestyle modifications and behavioral modifications. Review plans of next visit which includes:   fasting labs, indirect calorimetry, ECG and a full nutritional and lifestyle evaluation.  *** minutes was spent today on this visit including the above counseling, pre-visit chart review, and post-visit documentation.

## 2022-07-20 NOTE — Progress Notes (Unsigned)
TeleHealth Visit:  Due to the COVID-19 pandemic, this visit was completed with telemedicine (audio/video) technology to reduce patient and provider exposure as well as to preserve personal protective equipment.   Lauren [name] has verbally consented to this TeleHealth visit. The patient is located at home, the provider is located at the [facility] office. The participants in this visit include the listed provider and patient. The visit was conducted today via MyChart Video.   Chief Complaint: OBESITY Lauren [name] is here to discuss her progress with her obesity treatment plan along with follow-up of her obesity related diagnoses. Lauren [name] is on the Category 2 Plan and states she is following her eating plan approximately 50-60% of the time. Lauren [name] states she is walking the dogs a couple of times per week.    Today's visit was #: 19 Starting weight: 219 lbs Starting date: 04/22/2021  Interim History: Miliani has been struggling to follow her plan closely. She has had a lot of health issues and she is feeling overwhelmed. She is not able to concentrate on weight loss as much as she would like to.   Subjective:   1. Other constipation Annalisa had increased abdominal cramping and constipation. She is not on any OTC constipation medications. She is also on Opioids for pain.   2. Stress Lauren [name] notes her stress has increased and she has done more comfort eating. She has surgery coming up and she is feeling overwhelmed.   Assessment/Plan:   1. Other constipation Lauren [name] agreed to start miralax 17 grams once daily, with no refills. We will continue to follow.   2. Stress We discussed ways to get more support while recovering, and we will work on weight loss after she is back to her normal routine.   3. Obesity, Current BMI 32.9 Lauren [name] is currently in the action stage of change. As such, her goal is to maintain weight for now. She has agreed to practicing portion control and making  smarter food choices, such as increasing vegetables and decreasing simple carbohydrates.   Lauren [name] goal is to maintain her weight and no skip meals while recovering from surgery. She will get back to her formal meal plan when she is recovered.   Behavioral modification strategies: increasing lean protein intake and increasing vegetables.  Lauren [name] has agreed to follow-up with our clinic in 8 to 9 weeks. She was informed of the importance of frequent follow-up visits to maximize her success with intensive lifestyle modifications for her multiple health conditions.  Objective:   VITALS: Per patient if applicable, see vitals. GENERAL: Alert and in no acute distress. CARDIOPULMONARY: No increased WOB. Speaking in clear sentences.  PSYCH: Pleasant and cooperative. Speech normal rate and rhythm. Affect is appropriate. Insight and judgement are appropriate. Attention is focused, linear, and appropriate.  NEURO: Oriented as arrived to appointment on time with no prompting.   Lab Results  Component Value Date   CREATININE 0.87 06/08/2022   BUN 17 06/08/2022   NA 134 (L) 06/08/2022   K 3.6 06/08/2022   CL 98 06/08/2022   CO2 30 06/08/2022   Lab Results  Component Value Date   ALT 12 06/08/2022   AST 16 06/08/2022   ALKPHOS 50 06/08/2022   BILITOT 0.4 06/08/2022   Lab Results  Component Value Date   HGBA1C 6.1 (H) 05/11/2022   HGBA1C 5.9 (H) 12/10/2021   HGBA1C 6.7 (H) 04/22/2021   HGBA1C 8.9 (H) 11/13/2018   HGBA1C 7.7 (H) 11/25/2017   Lab Results  Component Value Date   INSULIN 11.1 05/11/2022   INSULIN 13.3 12/10/2021   INSULIN 22.3 04/22/2021   Lab Results  Component Value Date   TSH 2.150 05/11/2022   Lab Results  Component Value Date   CHOL 128 05/11/2022   HDL 56 05/11/2022   LDLCALC 56 05/11/2022   TRIG 83 05/11/2022   Lab Results  Component Value Date   VD25OH 69.3 05/11/2022   VD25OH 86.9 12/10/2021   VD25OH 40.7 04/22/2021   Lab Results  Component  Value Date   WBC 6.8 06/08/2022   HGB 12.3 06/08/2022   HCT 36.5 06/08/2022   MCV 88.4 06/08/2022   PLT 272 06/08/2022   No results found for: "IRON", "TIBC", "FERRITIN"  Attestation Statements:   Reviewed by clinician on day of visit: allergies, medications, problem list, medical history, surgical history, family history, social history, and previous encounter notes.   I, Trixie [name], am acting as transcriptionist for Dennard Nip, MD.  I have reviewed the above documentation for accuracy and completeness, and I agree with the above. - Dennard Nip, MD

## 2022-07-21 MED ORDER — POLYETHYLENE GLYCOL 3350 17 GM/SCOOP PO POWD: 17.0000 g | Freq: Every day | ORAL | 0 refills | Status: AC

## 2022-07-22 NOTE — Pre-Procedure Instructions (Signed)
Surgical Instructions    Your procedure is scheduled on Tuesday, October 3.  Report to [name] Main Entrance "A" at 5:30 A.M., then check in with the Admitting office.  Call this number if you have problems the morning of surgery:  [phone]   If you have any questions prior to your surgery date call [phone]: Open Monday-Friday 8am-4pm    Remember:  Do not eat after midnight the night before your surgery  You may drink clear liquids until 4:30AM the morning of your surgery.   Clear liquids allowed are: Water, Non-Citrus Juices (without pulp), Carbonated Beverages, Clear Tea, Black Coffee ONLY (NO MILK, CREAM OR POWDERED CREAMER of any kind), and Gatorade    Take these medicines the morning of surgery with A SIP OF WATER:  rosuvastatin (CRESTOR)  pantoprazole (PROTONIX)  HYDROcodone-acetaminophen ([place])   Follow your surgeon's instructions on when to stop Aspirin.  If no instructions were given by your surgeon then you will need to call the office to get those instructions.     As of today, STOP taking any Aleve, Naproxen, Ibuprofen, Motrin, Advil, Goody's, BC's, all herbal medications, fish oil, and all vitamins.  WHAT DO I DO ABOUT MY DIABETES MEDICATION?   Do not take oral diabetes medicines (pills) the morning of surgery. DO NOT TAKE pioglitazone (ACTOS)  the day of surgery   HOW TO MANAGE YOUR DIABETES BEFORE AND AFTER SURGERY  Why is it important to control my blood sugar before and after surgery? Improving blood sugar levels before and after surgery helps healing and can limit problems. A way of improving blood sugar control is eating a healthy diet by:  Eating less sugar and carbohydrates  Increasing activity/exercise  Talking with your doctor about reaching your blood sugar goals High blood sugars (greater than 180 mg/dL) can raise your risk of infections and slow your recovery, so you will need to focus on controlling your diabetes during the weeks  before surgery. Make sure that the doctor who takes care of your diabetes knows about your planned surgery including the date and location.  How do I manage my blood sugar before surgery? Check your blood sugar at least 4 times a day, starting 2 days before surgery, to make sure that the level is not too high or low.  Check your blood sugar the morning of your surgery when you wake up and every 2 hours until you get to the Short Stay unit.  If your blood sugar is less than 70 mg/dL, you will need to treat for low blood sugar: Do not take insulin. Treat a low blood sugar (less than 70 mg/dL) with  cup of clear juice (cranberry or apple), 4 glucose tablets, OR glucose gel. Recheck blood sugar in 15 minutes after treatment (to make sure it is greater than 70 mg/dL). If your blood sugar is not greater than 70 mg/dL on recheck, call [phone] for further instructions. Report your blood sugar to the short stay nurse when you get to Short Stay.  If you are admitted to the hospital after surgery: Your blood sugar will be checked by the staff and you will probably be given insulin after surgery (instead of oral diabetes medicines) to make sure you have good blood sugar levels. The goal for blood sugar control after surgery is 80-180 mg/dL.       [facility] is not responsible for any belongings or valuables.    Do NOT Smoke (Tobacco/Vaping)  24 hours prior to your procedure  If you use a CPAP at night, you may bring your mask for your overnight stay.   Contacts, glasses, hearing aids, dentures or partials may not be worn into surgery, please bring cases for these belongings   For patients admitted to the hospital, discharge time will be determined by your treatment team.   Patients discharged the day of surgery will not be allowed to drive home, and someone needs to stay with them for 24 hours.   SURGICAL WAITING ROOM VISITATION Patients having surgery or a procedure may have no more  than 2 support people in the waiting area - these visitors may rotate.   Children under the age of 62 must have an adult with them who is not the patient. If the patient needs to stay at the hospital during part of their recovery, the visitor guidelines for inpatient rooms apply. Pre-op nurse will coordinate an appropriate time for 1 support person to accompany patient in pre-op.  This support person may not rotate.   Please refer to the [facility] website for the visitor guidelines for Inpatients (after your surgery is over and you are in a regular room).    Special instructions:    Oral Hygiene is also important to reduce your risk of infection.  Remember - BRUSH YOUR TEETH THE MORNING OF SURGERY WITH YOUR REGULAR TOOTHPASTE   [place]- Preparing For Surgery  Before surgery, you can play an important role. Because skin is not sterile, your skin needs to be as free of germs as possible. You can reduce the number of germs on your skin by washing with CHG (chlorahexidine gluconate) Soap before surgery.  CHG is an antiseptic cleaner which kills germs and bonds with the skin to continue killing germs even after washing.     Please do not use if you have an allergy to CHG or antibacterial soaps. If your skin becomes reddened/irritated stop using the CHG.  Do not shave (including legs and underarms) for at least 48 hours prior to first CHG shower. It is OK to shave your face.  Please follow these instructions carefully.     Shower the NIGHT BEFORE SURGERY and the MORNING OF SURGERY with CHG Soap.   If you chose to wash your hair, wash your hair first as usual with your normal shampoo. After you shampoo, rinse your hair and body thoroughly to remove the shampoo.  Then [employer] and genitals (private parts) with your normal soap and rinse thoroughly to remove soap.  After that Use CHG Soap as you would any other liquid soap. You can apply CHG directly to the skin and wash gently with a  scrungie or a clean washcloth.   Apply the CHG Soap to your body ONLY FROM THE NECK DOWN.  Do not use on open wounds or open sores. Avoid contact with your eyes, ears, mouth and genitals (private parts). Wash Face and genitals (private parts)  with your normal soap.   Wash thoroughly, paying special attention to the area where your surgery will be performed.  Thoroughly rinse your body with warm water from the neck down.  DO NOT shower/wash with your normal soap after using and rinsing off the CHG Soap.  Pat yourself dry with a CLEAN TOWEL.  Wear CLEAN PAJAMAS to bed the night before surgery  Place CLEAN SHEETS on your bed the night before your surgery  DO NOT SLEEP WITH PETS.   Day of Surgery:  Take a shower with CHG soap. Wear Clean/Comfortable  clothing the morning of surgery Do not wear jewelry or makeup. Do not wear lotions, powders, perfumes/cologne or deodorant. Do not shave 48 hours prior to surgery.  Men may shave face and neck. Do not bring valuables to the hospital. Do not wear nail polish, gel polish, artificial nails, or any other type of covering on natural nails (fingers and toes) If you have artificial nails or gel coating that need to be removed by a nail salon, please have this removed prior to surgery. Artificial nails or gel coating may interfere with anesthesia's ability to adequately monitor your vital signs. Remember to brush your teeth WITH YOUR REGULAR TOOTHPASTE.    If you received a COVID test during your pre-op visit, it is requested that you wear a mask when out in public, stay away from anyone that may not be feeling well, and notify your surgeon if you develop symptoms. If you have been in contact with anyone that has tested positive in the last 10 days, please notify your surgeon.    Please read over the following fact sheets that you were given.

## 2022-07-23 ENCOUNTER — Encounter (HOSPITAL_COMMUNITY): Payer: Self-pay

## 2022-07-23 ENCOUNTER — Encounter (HOSPITAL_COMMUNITY)
Admission: RE | Admit: 2022-07-23 | Discharge: 2022-07-23 | Disposition: A | Discharge status: Home / Self Care | Payer: HMO | Source: Ambulatory Visit | Attending: Orthopaedic Surgery | Admitting: Orthopaedic Surgery

## 2022-07-23 ENCOUNTER — Other Ambulatory Visit: Payer: Self-pay

## 2022-07-23 VITALS — BP 125/53 | HR 66 | Temp 97.8°F | Resp 17 | Ht 64.0 in | Wt 200.1 lb

## 2022-07-23 DIAGNOSIS — I440 Atrioventricular block, first degree: Secondary | ICD-10-CM | POA: Diagnosis not present

## 2022-07-23 DIAGNOSIS — Z01818 Encounter for other preprocedural examination: Secondary | ICD-10-CM | POA: Insufficient documentation

## 2022-07-23 DIAGNOSIS — Z79899 Other long term (current) drug therapy: Secondary | ICD-10-CM | POA: Diagnosis not present

## 2022-07-23 HISTORY — DX: Personal history of other diseases of the digestive system: Z87.19

## 2022-07-23 LAB — SURGICAL PCR SCREEN
MRSA, PCR: NEGATIVE
Staphylococcus aureus: NEGATIVE

## 2022-07-23 LAB — BASIC METABOLIC PANEL
Anion gap: 11 (ref 5–15)
BUN: 16 mg/dL (ref 8–23)
CO2: 22 mmol/L (ref 22–32)
Calcium: 9 mg/dL (ref 8.9–10.3)
Chloride: 97 mmol/L — ABNORMAL LOW (ref 98–111)
Creatinine, Ser: 0.99 mg/dL (ref 0.44–1.00)
GFR, Estimated: 59 mL/min — ABNORMAL LOW (ref >60)
Glucose, Bld: 92 mg/dL (ref 70–99)
Potassium: 4.1 mmol/L (ref 3.5–5.1)
Sodium: 130 mmol/L — ABNORMAL LOW (ref 135–145)

## 2022-07-23 LAB — GLUCOSE, CAPILLARY: Glucose-Capillary: 130 mg/dL — ABNORMAL HIGH (ref 70–99)

## 2022-07-23 LAB — CBC
HCT: 35.5 % — ABNORMAL LOW (ref 36.0–46.0)
Hemoglobin: 11.8 g/dL — ABNORMAL LOW (ref 12.0–15.0)
MCH: 29.8 pg (ref 26.0–34.0)
MCHC: 33.2 g/dL (ref 30.0–36.0)
MCV: 89.6 fL (ref 80.0–100.0)
Platelets: 270 10*3/uL (ref 150–400)
RBC: 3.96 MIL/uL (ref 3.87–5.11)
RDW: 13.5 % (ref 11.5–15.5)
WBC: 6.4 10*3/uL (ref 4.0–10.5)
nRBC: 0 % (ref 0.0–0.2)

## 2022-07-23 LAB — HEMOGLOBIN A1C
Hgb A1c MFr Bld: 6.1 % — ABNORMAL HIGH (ref 4.8–5.6)
Mean Plasma Glucose: 128.37 mg/dL

## 2022-07-23 NOTE — Progress Notes (Addendum)
PCP - [name] Ross,MD Cardiologist - Skeet Latch MD  PPM/ICD - denies Device Orders -  Rep Notified -   Chest x-ray - 02/14/20 EKG - 07/23/20 Stress Test - 09/08/18 ECHO - 03/31/21 Cardiac Cath -   Sleep Study - none CPAP -   Fasting Blood Sugar - 80's Checks Blood Sugar every other day  Blood Thinner Instructions: Aspirin Instructions:  ERAS Protcol -clears until 0430 PRE-SURGERY Ensure or G2-   COVID TEST- nA   Anesthesia review: yes-cardiac history;Na+ 130  Patient denies shortness of breath, fever, cough and chest pain at PAT appointment   All instructions explained to the patient, with a verbal understanding of the material. Patient agrees to go over the instructions while at home for a better understanding. Patient also instructed to wear a mask when out in public prior to surgery. The opportunity to ask questions was provided.

## 2022-07-23 NOTE — Progress Notes (Signed)
Notified Sherry, Dr. Trevor Mace scheduler,  of pt's Na+ result of 130 and of the need for pre op orders. She will notify Dr. [name]. Chart forwarded to anesthesia for review.

## 2022-07-26 ENCOUNTER — Other Ambulatory Visit: Payer: Self-pay | Admitting: Physician Assistant

## 2022-07-26 NOTE — Progress Notes (Signed)
Anesthesia Chart Review:  Case: [id number] Date/Time: 08/03/22 0715   Procedure: RIGHT TOTAL KNEE ARTHROPLASTY (Right: Knee)   Anesthesia type: Choice   Pre-op diagnosis: OSTEOARTHRITIS / DEGENERATIVE JOINT DISEASE RIGHT KNEE   Location: [place] [facility] ROOM 07 / [place] [facility]   Surgeons: Mcarthur Rossetti, MD       DISCUSSION: Patient is a 76 year old female scheduled for the above procedure.  History includes former smoker (quit 09/22/82), HTN, hypercholesterolemia, LE edema, DM2, neuropathy, coronary calcifications, palpitations/PVCs, GERD, hiatal hernia, ulcerative colitis (colon resection 1986), fatty liver, colitis, right breast cancer (s/p right breast lumpectomy 11/16/18), osteoarthritis (right THA 11/26/16; left TKA 12/02/17), cholecystectomy (1992).  VS: BP (!) 125/53   Pulse 66   Temp 36.6 C   Resp 17   Ht '5\' 4"'$  (1.626 m)   Wt 90.8 kg   SpO2 99%   BMI 34.35 kg/m   PROVIDERS: Lawerance Cruel, MD is PCP  Skeet Latch, MD is cardiologist   LABS: {CHL AN LABS REVIEWED:112001::"Labs reviewed: Acceptable for surgery."} (all labs ordered are listed, but only abnormal results are displayed)  Labs Reviewed  BASIC METABOLIC PANEL - Abnormal; Notable for the following components:      Result Value   Sodium 130 (*)    Chloride 97 (*)    GFR, Estimated 59 (*)    All other components within normal limits  HEMOGLOBIN A1C - Abnormal; Notable for the following components:   Hgb A1c MFr Bld 6.1 (*)    All other components within normal limits  CBC - Abnormal; Notable for the following components:   Hemoglobin 11.8 (*)    HCT 35.5 (*)    All other components within normal limits  GLUCOSE, CAPILLARY - Abnormal; Notable for the following components:   Glucose-Capillary 130 (*)    All other components within normal limits  SURGICAL PCR SCREEN     IMAGES:  Xray L-spine 12/18/21: IMPRESSION: 1. Grade 1 anterolisthesis of the L4 vertebral body on L5, with mild retrolisthesis of the  L1 vertebral body on L2. 2. Multilevel degenerative changes, most prominent at the level of L2-L3. 3. Mild to moderate severity dextroscoliosis of the lower lumbar spine.    EKG: 07/23/22: Sinus rhythm with 1st degree A-V block Otherwise normal ECG When compared with ECG of 25-Nov-2017 11:31, PREVIOUS ECG IS PRESENT First degree heart block , new Confirmed by Adrian Prows ([id number]) on 07/24/2022 3:13:20 PM   CV: CT Cardiac 9//22: - Coronary arteries: Normal coronary origins.  Right dominance. - Right Coronary Artery: Mild mixed atherosclerotic plaque in the mid RCA, 25-49% stenosis. - Left Main Coronary Artery: Minimal mixed atherosclerotic plaque in the ostial and distal LM, <25% stenosis. - Left Anterior Descending Coronary Artery: Mild mixed atherosclerotic plaque in the proximal and mid LAD, 25-49% stenosis. - Left Circumflex Artery: No detectable plaque or stenosis. Mild mixed atherosclerotic plaque in mid OM1. - Aorta: Normal size, 33 mm at the mid ascending aorta (level of the PA bifurcation) measured double oblique. Minimal calcifications. No dissection. - Aortic Valve: No calcifications.  Tricuspid aortic valve. IMPRESSION: 1. Mild CAD in proximal and mild LAD, mid RCA and mid OM1, CADRADS = 2. 2. Coronary calcium score is 460, which places the patient in the 85th percentile for age and sex matched control. 3. Normal coronary origin with right dominance. 4. Mild dilation of main pulmonary artery, 30 mm, may suggest increased pulmonary pressure.   Echo 03/31/21: IMPRESSIONS   1. Left ventricular ejection fraction, by estimation, is  60 to 65%. The  left ventricle has normal function. The left ventricle has no regional  wall motion abnormalities. There is mild left ventricular hypertrophy.  Left ventricular diastolic parameters  are consistent with Grade I diastolic dysfunction (impaired relaxation).  Elevated left ventricular end-diastolic pressure.   2. Right  ventricular systolic function is hyperdynamic. The right  ventricular size is normal.   3. The mitral valve is grossly normal. Trivial mitral valve  regurgitation.   4. The aortic valve is tricuspid. Aortic valve regurgitation is not  visualized.  - Comparison(s): No prior Echocardiogram.    Nuclear stress test 09/08/18: The left ventricular ejection fraction is hyperdynamic (>65%). Nuclear stress EF: 77%. There was no ST segment deviation noted during stress. The study is normal. This is a low risk study. Normal pharmacologic nuclear stress test with no evidence for prior infarct or ischemia.    Past Medical History:  Diagnosis Date   Anxiety    Arthritis    Back, Hip- right   Atypical chest pain 06/25/2021   Back pain    Cancer ([place])    right breast   Cataract    Colitis    Coronary artery calcification 02/20/2020   Diabetes mellitus without complication (HCC)    Type II   Edema 03/05/2021   Edema of both lower extremities    Endometriosis    Family history of adverse reaction to anesthesia    Father - N/V - blood pressure; daughter N/V   Family history of breast cancer    Family history of colon cancer    Family history of kidney cancer    Family history of prostate cancer    Fatty liver    GERD (gastroesophageal reflux disease)    History of hiatal hernia    History of kidney stones    History of ulcerative colitis    Hypertension    Insomnia    Joint pain    Kidney problem    Lower extremity edema 03/05/2021   Lumbar disc disease    L4 L5   Neuropathy    Osteoarthritis    Other hyperlipidemia    Palpitations    Personal history of radiation therapy    Pneumonia    1983, 2003   Pure hypercholesterolemia 02/20/2020   PVCs (premature ventricular contractions)    benign   Umbilical hernia    Vitamin D deficiency    Wears glasses     Past Surgical History:  Procedure Laterality Date   APPENDECTOMY     BREAST BIOPSY  08-13-2009  DR TSUEI    EXCISION LEFT NIPPLE DUCT   BREAST EXCISIONAL BIOPSY Left    x2   BREAST EXCISIONAL BIOPSY Right    BREAST LUMPECTOMY Right 11/16/2018   invasive ductal   CHOLECYSTECTOMY  1992   COLON RESECTION  1986   ULCERATIVE COLITIS   COLON SURGERY     ESOPHAGEAL MANOMETRY N/A 06/11/2013   Procedure: ESOPHAGEAL MANOMETRY (EM);  Surgeon: Jeryl Columbia, MD;  Location: [facility];  Service: Endoscopy;  Laterality: N/A;   ESOPHAGOGASTRODUODENOSCOPY (EGD) WITH PROPOFOL N/A 05/31/2016   Procedure: ESOPHAGOGASTRODUODENOSCOPY (EGD) WITH PROPOFOL;  Surgeon: Clarene Essex, MD;  Location: [facility] ENDOSCOPY;  Service: Endoscopy;  Laterality: N/A;   EXCISION RIGHT BREAST MASS   10-20-2005  DR Collier [place] YOUNG   EYE SURGERY Bilateral 2023   cataract   FRACTURE SURGERY     left arm   kidney stone removed  12/2009   KNEE ARTHROSCOPY Left  MCL   LEFT THUMB CARPOMETACARPAL JOINT SUSPENSIONPLASTY  04-06-2006  DR Burney Gauze   LEFT WRIST ARTHROSCOPY W/ DEBRIDEMENT AND REMOVAL CYST  03-26-2004  DR Burney Gauze   MYOMECTOMY     RADIOACTIVE SEED GUIDED PARTIAL MASTECTOMY WITH AXILLARY SENTINEL LYMPH NODE BIOPSY Right 11/16/2018   Procedure: RIGHT BREAST RADIOACTIVE SEED GUIDED PARTIAL MASTECTOMY WITH  SENTINEL  NODE BIOPSY;  Surgeon: Coralie Keens, MD;  Location: [facility];  Service: General;  Laterality: Right;   RIGHT COLECTOMY     RIGHT URETEROSCOPIC STONE EXTRACTION  12-11-2009  DR [place] HIP ARTHROPLASTY Right 11/26/2016   Procedure: RIGHT TOTAL HIP ARTHROPLASTY ANTERIOR APPROACH;  Surgeon: Mcarthur Rossetti, MD;  Location: [facility];  Service: Orthopedics;  Laterality: Right;   TOTAL KNEE ARTHROPLASTY Left 12/02/2017   Procedure: LEFT TOTAL KNEE ARTHROPLASTY;  Surgeon: Mcarthur Rossetti, MD;  Location: [facility];  Service: Orthopedics;  Laterality: Left;   WRIST SURGERY     both    MEDICATIONS:  aspirin EC 81 MG tablet   B Complex-C (B-COMPLEX WITH VITAMIN C) tablet    chlorthalidone (HYGROTON) 25 MG tablet   Cholecalciferol (VITAMIN D3) 125 MCG (5000 UT) CAPS   gabapentin (NEURONTIN) 300 MG capsule   HYDROcodone-acetaminophen (NORCO) 10-325 MG tablet   Lancets (ONETOUCH DELICA PLUS [id number]) MISC   lidocaine (LMX) 4 % cream   lisinopril (ZESTRIL) 40 MG tablet   methocarbamol (ROBAXIN) 500 MG tablet   ONETOUCH VERIO test strip   pantoprazole (PROTONIX) 40 MG tablet   phenylephrine (NEO-SYNEPHRINE) 0.25 % nasal spray   pioglitazone (ACTOS) 30 MG tablet   polyethylene glycol powder (GLYCOLAX/MIRALAX) 17 GM/SCOOP powder   rosuvastatin (CRESTOR) 20 MG tablet   Vitamin D, Ergocalciferol, (DRISDOL) 1.25 MG (50000 UNIT) CAPS capsule   No current facility-administered medications for this encounter.

## 2022-07-27 NOTE — Anesthesia Preprocedure Evaluation (Addendum)
Anesthesia Evaluation  Patient identified by MRN, date of birth, ID band Patient awake    Reviewed: Allergy & Precautions, H&P , NPO status , Patient's Chart, lab work & pertinent test results  Airway Mallampati: II  TM Distance: >3 FB Neck ROM: Full    Dental no notable dental hx. (+) Teeth Intact, Dental Advisory Given   Pulmonary neg pulmonary ROS, former smoker,    Pulmonary exam normal breath sounds clear to auscultation       Cardiovascular Exercise Tolerance: Good hypertension, Pt. on medications + CAD   Rhythm:Regular Rate:Normal  CT Cardiac 07/03/21: IMPRESSION: 1. Mild CAD in proximal and mild LAD, mid RCA and mid OM1, CADRADS = 2. 2. Coronary calcium score is 460, which places the patient in the 85th percentile for age and sex matched control. 3. Normal coronary origin with right dominance. 4. Mild dilation of main pulmonary artery, 30 mm, may suggest increased pulmonary pressure.   Echo 03/31/21: IMPRESSIONS  1. Left ventricular ejection fraction, by estimation, is 60 to 65%. The  left ventricle has normal function. The left ventricle has no regional  wall motion abnormalities. There is mild left ventricular hypertrophy.  Left ventricular diastolic parameters  are consistent with Grade I diastolic dysfunction (impaired relaxation).  Elevated left ventricular end-diastolic pressure.  2. Right ventricular systolic function is hyperdynamic. The right  ventricular size is normal.  3. The mitral valve is grossly normal. Trivial mitral valve  regurgitation.  4. The aortic valve is tricuspid. Aortic valve regurgitation is not  visualized.     Neuro/Psych Anxiety negative neurological ROS     GI/Hepatic Neg liver ROS, hiatal hernia, GERD  Medicated,  Endo/Other  diabetesMorbid obesity  Renal/GU negative Renal ROS  negative genitourinary   Musculoskeletal  (+) Arthritis , Osteoarthritis,    Abdominal    Peds  Hematology negative hematology ROS (+)   Anesthesia Other Findings   Reproductive/Obstetrics negative OB ROS                           Anesthesia Physical Anesthesia Plan  ASA: 3  Anesthesia Plan: Spinal   Post-op Pain Management: Regional block* and Tylenol PO (pre-op)*   Induction: Intravenous  PONV Risk Score and Plan: 3 and Ondansetron, Propofol infusion and Treatment may vary due to age or medical condition  Airway Management Planned: Natural Airway and Simple Face Mask  Additional Equipment:   Intra-op Plan:   Post-operative Plan:   Informed Consent: I have reviewed the patients History and Physical, chart, labs and discussed the procedure including the risks, benefits and alternatives for the proposed anesthesia with the patient or authorized representative who has indicated his/her understanding and acceptance.     Dental advisory given  Plan Discussed with: CRNA  Anesthesia Plan Comments: (PAT note written 07/27/2022 by Myra Gianotti, PA-C. )      Anesthesia Quick Evaluation

## 2022-08-02 ENCOUNTER — Telehealth: Payer: Self-pay | Admitting: *Deleted

## 2022-08-02 ENCOUNTER — Other Ambulatory Visit: Payer: HMO | Admitting: Surgical

## 2022-08-02 NOTE — Care Plan (Signed)
[facility] RNCM call to patient to discuss her upcoming Right total knee arthroplasty with Dr. [name] on 08/03/22. She is an Ortho bundle patient through [facility] and is agreeable to case management. She lives alone, but has a son and daughter that will be assisting after discharge. She has a RW, cane and 3in1/BSC at home already. No DME needed. Referral made to [facility] after choice provided. Anticipate this is needed after short hospital stay. Would like to go to OPPT at [place] [facility] with [facility]. Reviewed all post op care instructions and will continue to follow for needs.

## 2022-08-02 NOTE — Telephone Encounter (Signed)
Ortho bundle pre-op call complete.

## 2022-08-03 ENCOUNTER — Inpatient Hospital Stay (HOSPITAL_COMMUNITY)
Admission: RE | Admit: 2022-08-03 | Discharge: 2022-08-05 | DRG: 470 | Disposition: A | Discharge status: Home Health | Payer: HMO | Attending: Orthopaedic Surgery | Admitting: Orthopaedic Surgery

## 2022-08-03 ENCOUNTER — Encounter (HOSPITAL_COMMUNITY): Payer: Self-pay | Admitting: Orthopaedic Surgery

## 2022-08-03 ENCOUNTER — Ambulatory Visit (HOSPITAL_COMMUNITY): Payer: HMO | Admitting: Vascular Surgery

## 2022-08-03 ENCOUNTER — Observation Stay (HOSPITAL_COMMUNITY): Payer: HMO

## 2022-08-03 ENCOUNTER — Encounter (HOSPITAL_COMMUNITY)
Admission: RE | Disposition: A | Discharge status: Home Health | Payer: Self-pay | Source: Home / Self Care | Attending: Orthopaedic Surgery

## 2022-08-03 ENCOUNTER — Other Ambulatory Visit: Payer: Self-pay

## 2022-08-03 ENCOUNTER — Ambulatory Visit (HOSPITAL_BASED_OUTPATIENT_CLINIC_OR_DEPARTMENT_OTHER): Payer: HMO | Admitting: Anesthesiology

## 2022-08-03 DIAGNOSIS — F419 Anxiety disorder, unspecified: Secondary | ICD-10-CM | POA: Diagnosis present

## 2022-08-03 DIAGNOSIS — Z8042 Family history of malignant neoplasm of prostate: Secondary | ICD-10-CM

## 2022-08-03 DIAGNOSIS — Z882 Allergy status to sulfonamides status: Secondary | ICD-10-CM

## 2022-08-03 DIAGNOSIS — K219 Gastro-esophageal reflux disease without esophagitis: Secondary | ICD-10-CM | POA: Diagnosis present

## 2022-08-03 DIAGNOSIS — I10 Essential (primary) hypertension: Secondary | ICD-10-CM | POA: Diagnosis present

## 2022-08-03 DIAGNOSIS — Z820 Family history of epilepsy and other diseases of the nervous system: Secondary | ICD-10-CM

## 2022-08-03 DIAGNOSIS — K5909 Other constipation: Secondary | ICD-10-CM | POA: Diagnosis present

## 2022-08-03 DIAGNOSIS — M1611 Unilateral primary osteoarthritis, right hip: Secondary | ICD-10-CM | POA: Diagnosis present

## 2022-08-03 DIAGNOSIS — Z794 Long term (current) use of insulin: Secondary | ICD-10-CM | POA: Diagnosis not present

## 2022-08-03 DIAGNOSIS — E7800 Pure hypercholesterolemia, unspecified: Secondary | ICD-10-CM | POA: Diagnosis present

## 2022-08-03 DIAGNOSIS — G4700 Insomnia, unspecified: Secondary | ICD-10-CM | POA: Diagnosis present

## 2022-08-03 DIAGNOSIS — Z9011 Acquired absence of right breast and nipple: Secondary | ICD-10-CM

## 2022-08-03 DIAGNOSIS — Z96652 Presence of left artificial knee joint: Secondary | ICD-10-CM | POA: Diagnosis present

## 2022-08-03 DIAGNOSIS — N809 Endometriosis, unspecified: Secondary | ICD-10-CM | POA: Diagnosis present

## 2022-08-03 DIAGNOSIS — Z79899 Other long term (current) drug therapy: Secondary | ICD-10-CM

## 2022-08-03 DIAGNOSIS — Z7982 Long term (current) use of aspirin: Secondary | ICD-10-CM

## 2022-08-03 DIAGNOSIS — Z8051 Family history of malignant neoplasm of kidney: Secondary | ICD-10-CM

## 2022-08-03 DIAGNOSIS — I2510 Atherosclerotic heart disease of native coronary artery without angina pectoris: Secondary | ICD-10-CM | POA: Diagnosis present

## 2022-08-03 DIAGNOSIS — Z91048 Other nonmedicinal substance allergy status: Secondary | ICD-10-CM

## 2022-08-03 DIAGNOSIS — M7061 Trochanteric bursitis, right hip: Secondary | ICD-10-CM | POA: Diagnosis present

## 2022-08-03 DIAGNOSIS — Z8719 Personal history of other diseases of the digestive system: Secondary | ICD-10-CM

## 2022-08-03 DIAGNOSIS — Z96651 Presence of right artificial knee joint: Secondary | ICD-10-CM

## 2022-08-03 DIAGNOSIS — Z9049 Acquired absence of other specified parts of digestive tract: Secondary | ICD-10-CM

## 2022-08-03 DIAGNOSIS — D62 Acute posthemorrhagic anemia: Secondary | ICD-10-CM | POA: Diagnosis present

## 2022-08-03 DIAGNOSIS — Z01818 Encounter for other preprocedural examination: Secondary | ICD-10-CM

## 2022-08-03 DIAGNOSIS — Z885 Allergy status to narcotic agent status: Secondary | ICD-10-CM

## 2022-08-03 DIAGNOSIS — E559 Vitamin D deficiency, unspecified: Secondary | ICD-10-CM | POA: Diagnosis present

## 2022-08-03 DIAGNOSIS — K760 Fatty (change of) liver, not elsewhere classified: Secondary | ICD-10-CM | POA: Diagnosis present

## 2022-08-03 DIAGNOSIS — R600 Localized edema: Secondary | ICD-10-CM | POA: Diagnosis present

## 2022-08-03 DIAGNOSIS — Z800 Family history of malignant neoplasm of digestive organs: Secondary | ICD-10-CM | POA: Diagnosis not present

## 2022-08-03 DIAGNOSIS — Z471 Aftercare following joint replacement surgery: Secondary | ICD-10-CM | POA: Diagnosis not present

## 2022-08-03 DIAGNOSIS — Z841 Family history of disorders of kidney and ureter: Secondary | ICD-10-CM

## 2022-08-03 DIAGNOSIS — Z923 Personal history of irradiation: Secondary | ICD-10-CM

## 2022-08-03 DIAGNOSIS — Z888 Allergy status to other drugs, medicaments and biological substances status: Secondary | ICD-10-CM

## 2022-08-03 DIAGNOSIS — Z79891 Long term (current) use of opiate analgesic: Secondary | ICD-10-CM

## 2022-08-03 DIAGNOSIS — M1711 Unilateral primary osteoarthritis, right knee: Secondary | ICD-10-CM

## 2022-08-03 DIAGNOSIS — Z833 Family history of diabetes mellitus: Secondary | ICD-10-CM

## 2022-08-03 DIAGNOSIS — M25461 Effusion, right knee: Secondary | ICD-10-CM | POA: Diagnosis present

## 2022-08-03 DIAGNOSIS — Z853 Personal history of malignant neoplasm of breast: Secondary | ICD-10-CM

## 2022-08-03 DIAGNOSIS — Z87891 Personal history of nicotine dependence: Secondary | ICD-10-CM

## 2022-08-03 DIAGNOSIS — Z83438 Family history of other disorder of lipoprotein metabolism and other lipidemia: Secondary | ICD-10-CM

## 2022-08-03 DIAGNOSIS — Z8249 Family history of ischemic heart disease and other diseases of the circulatory system: Secondary | ICD-10-CM

## 2022-08-03 DIAGNOSIS — M1909 Primary osteoarthritis, other specified site: Secondary | ICD-10-CM | POA: Diagnosis present

## 2022-08-03 DIAGNOSIS — Z9841 Cataract extraction status, right eye: Secondary | ICD-10-CM

## 2022-08-03 DIAGNOSIS — Z8701 Personal history of pneumonia (recurrent): Secondary | ICD-10-CM

## 2022-08-03 DIAGNOSIS — Z803 Family history of malignant neoplasm of breast: Secondary | ICD-10-CM | POA: Diagnosis not present

## 2022-08-03 DIAGNOSIS — Z887 Allergy status to serum and vaccine status: Secondary | ICD-10-CM

## 2022-08-03 DIAGNOSIS — Z880 Allergy status to penicillin: Secondary | ICD-10-CM

## 2022-08-03 DIAGNOSIS — Z9842 Cataract extraction status, left eye: Secondary | ICD-10-CM

## 2022-08-03 DIAGNOSIS — M179 Osteoarthritis of knee, unspecified: Secondary | ICD-10-CM | POA: Diagnosis present

## 2022-08-03 DIAGNOSIS — M5137 Other intervertebral disc degeneration, lumbosacral region: Secondary | ICD-10-CM | POA: Diagnosis present

## 2022-08-03 DIAGNOSIS — I493 Ventricular premature depolarization: Secondary | ICD-10-CM | POA: Diagnosis present

## 2022-08-03 DIAGNOSIS — Z87442 Personal history of urinary calculi: Secondary | ICD-10-CM | POA: Diagnosis not present

## 2022-08-03 DIAGNOSIS — E1142 Type 2 diabetes mellitus with diabetic polyneuropathy: Secondary | ICD-10-CM | POA: Diagnosis present

## 2022-08-03 DIAGNOSIS — Z6833 Body mass index (BMI) 33.0-33.9, adult: Secondary | ICD-10-CM

## 2022-08-03 DIAGNOSIS — Z823 Family history of stroke: Secondary | ICD-10-CM

## 2022-08-03 DIAGNOSIS — Z87448 Personal history of other diseases of urinary system: Secondary | ICD-10-CM

## 2022-08-03 DIAGNOSIS — E7849 Other hyperlipidemia: Secondary | ICD-10-CM | POA: Diagnosis present

## 2022-08-03 DIAGNOSIS — Z96641 Presence of right artificial hip joint: Secondary | ICD-10-CM | POA: Diagnosis present

## 2022-08-03 DIAGNOSIS — Z881 Allergy status to other antibiotic agents status: Secondary | ICD-10-CM

## 2022-08-03 HISTORY — PX: TOTAL KNEE ARTHROPLASTY: SHX125

## 2022-08-03 LAB — GLUCOSE, CAPILLARY
Glucose-Capillary: 83 mg/dL (ref 70–99)
Glucose-Capillary: 97 mg/dL (ref 70–99)

## 2022-08-03 SURGERY — ARTHROPLASTY, KNEE, TOTAL
Anesthesia: Spinal | Site: Knee | Laterality: Right

## 2022-08-03 MED ORDER — ORAL CARE MOUTH RINSE: 15.0000 mL | Freq: Once | OROMUCOSAL | Status: AC

## 2022-08-03 MED ORDER — FENTANYL CITRATE (PF) 250 MCG/5ML IJ SOLN
INTRAMUSCULAR | Status: DC | PRN
  Administered 2022-08-03: 100 ug via INTRAVENOUS

## 2022-08-03 MED ORDER — PIOGLITAZONE HCL 30 MG PO TABS
30.0000 mg | ORAL_TABLET | Freq: Every day | ORAL | Status: DC
  Administered 2022-08-04 – 2022-08-05 (×2): 30 mg via ORAL
  Filled 2022-08-03 (×3): qty 1

## 2022-08-03 MED ORDER — FENTANYL CITRATE (PF) 100 MCG/2ML IJ SOLN
INTRAMUSCULAR | Status: AC
  Filled 2022-08-03: qty 2

## 2022-08-03 MED ORDER — CHLORHEXIDINE GLUCONATE 0.12 % MT SOLN
15.0000 mL | Freq: Once | OROMUCOSAL | Status: AC
  Administered 2022-08-03: 15 mL via OROMUCOSAL
  Filled 2022-08-03: qty 15

## 2022-08-03 MED ORDER — ONDANSETRON HCL 4 MG/2ML IJ SOLN: 4.0000 mg | Freq: Four times a day (QID) | INTRAMUSCULAR | Status: DC | PRN

## 2022-08-03 MED ORDER — METHOCARBAMOL 1000 MG/10ML IJ SOLN: 500.0000 mg | Freq: Four times a day (QID) | INTRAVENOUS | Status: DC | PRN

## 2022-08-03 MED ORDER — HYDROCODONE-ACETAMINOPHEN 7.5-325 MG PO TABS
1.0000 | ORAL_TABLET | ORAL | Status: DC | PRN
  Administered 2022-08-03 – 2022-08-05 (×4): 2 via ORAL
  Filled 2022-08-03 (×3): qty 2

## 2022-08-03 MED ORDER — VANCOMYCIN HCL IN DEXTROSE 1-5 GM/200ML-% IV SOLN
1000.0000 mg | Freq: Two times a day (BID) | INTRAVENOUS | Status: AC
  Administered 2022-08-03: 1000 mg via INTRAVENOUS
  Filled 2022-08-03: qty 200

## 2022-08-03 MED ORDER — HYDROCODONE-ACETAMINOPHEN 5-325 MG PO TABS
1.0000 | ORAL_TABLET | ORAL | Status: DC | PRN
  Administered 2022-08-03 – 2022-08-05 (×6): 2 via ORAL
  Filled 2022-08-03 (×5): qty 2

## 2022-08-03 MED ORDER — GABAPENTIN 300 MG PO CAPS
600.0000 mg | ORAL_CAPSULE | Freq: Every day | ORAL | Status: DC
  Administered 2022-08-03 – 2022-08-04 (×2): 600 mg via ORAL
  Filled 2022-08-03 (×2): qty 2

## 2022-08-03 MED ORDER — ACETAMINOPHEN 325 MG PO TABS: 325.0000 mg | ORAL_TABLET | Freq: Four times a day (QID) | ORAL | Status: DC | PRN

## 2022-08-03 MED ORDER — ROSUVASTATIN CALCIUM 20 MG PO TABS
20.0000 mg | ORAL_TABLET | Freq: Every day | ORAL | Status: DC
  Administered 2022-08-04 – 2022-08-05 (×2): 20 mg via ORAL
  Filled 2022-08-03 (×2): qty 1

## 2022-08-03 MED ORDER — MEPERIDINE HCL 25 MG/ML IJ SOLN
INTRAMUSCULAR | Status: AC
  Filled 2022-08-03: qty 1

## 2022-08-03 MED ORDER — PROPOFOL 500 MG/50ML IV EMUL
INTRAVENOUS | Status: DC | PRN
  Administered 2022-08-03: 150 ug/kg/min via INTRAVENOUS

## 2022-08-03 MED ORDER — PANTOPRAZOLE SODIUM 40 MG PO TBEC
40.0000 mg | DELAYED_RELEASE_TABLET | Freq: Two times a day (BID) | ORAL | Status: DC
  Administered 2022-08-03 – 2022-08-05 (×4): 40 mg via ORAL
  Filled 2022-08-03 (×4): qty 1

## 2022-08-03 MED ORDER — ACETAMINOPHEN 500 MG PO TABS
1000.0000 mg | ORAL_TABLET | Freq: Once | ORAL | Status: AC
  Administered 2022-08-03: 1000 mg via ORAL
  Filled 2022-08-03: qty 2

## 2022-08-03 MED ORDER — B COMPLEX-C PO TABS
1.0000 | ORAL_TABLET | Freq: Every day | ORAL | Status: DC
  Administered 2022-08-03 – 2022-08-05 (×3): 1 via ORAL
  Filled 2022-08-03 (×3): qty 1

## 2022-08-03 MED ORDER — HYDROMORPHONE HCL 1 MG/ML IJ SOLN
0.2500 mg | INTRAMUSCULAR | Status: DC | PRN
  Administered 2022-08-03: 0.5 mg via INTRAVENOUS
  Administered 2022-08-03: 0.25 mg via INTRAVENOUS

## 2022-08-03 MED ORDER — METHOCARBAMOL 500 MG PO TABS
ORAL_TABLET | ORAL | Status: AC
  Administered 2022-08-04: 500 mg via ORAL
  Filled 2022-08-03: qty 1

## 2022-08-03 MED ORDER — TRANEXAMIC ACID-NACL 1000-0.7 MG/100ML-% IV SOLN
1000.0000 mg | INTRAVENOUS | Status: AC
  Administered 2022-08-03: 1000 mg via INTRAVENOUS
  Filled 2022-08-03: qty 100

## 2022-08-03 MED ORDER — PROPOFOL 10 MG/ML IV BOLUS
INTRAVENOUS | Status: DC | PRN
  Administered 2022-08-03: 20 mg via INTRAVENOUS
  Administered 2022-08-03: 30 mg via INTRAVENOUS

## 2022-08-03 MED ORDER — MENTHOL 3 MG MT LOZG: 1.0000 | LOZENGE | OROMUCOSAL | Status: DC | PRN

## 2022-08-03 MED ORDER — HYDROMORPHONE HCL 1 MG/ML IJ SOLN
INTRAMUSCULAR | Status: AC
  Filled 2022-08-03: qty 1

## 2022-08-03 MED ORDER — PHENOL 1.4 % MT LIQD: 1.0000 | OROMUCOSAL | Status: DC | PRN

## 2022-08-03 MED ORDER — DOCUSATE SODIUM 100 MG PO CAPS
100.0000 mg | ORAL_CAPSULE | Freq: Two times a day (BID) | ORAL | Status: DC
  Administered 2022-08-03 – 2022-08-05 (×4): 100 mg via ORAL
  Filled 2022-08-03 (×4): qty 1

## 2022-08-03 MED ORDER — METOCLOPRAMIDE HCL 5 MG/ML IJ SOLN: 5.0000 mg | Freq: Three times a day (TID) | INTRAMUSCULAR | Status: DC | PRN

## 2022-08-03 MED ORDER — SODIUM CHLORIDE 0.9 % IV SOLN: INTRAVENOUS | Status: DC

## 2022-08-03 MED ORDER — MORPHINE SULFATE (PF) 2 MG/ML IV SOLN
2.0000 mg | INTRAVENOUS | Status: DC | PRN
  Administered 2022-08-03 (×2): 2 mg via INTRAVENOUS
  Filled 2022-08-03 (×2): qty 1

## 2022-08-03 MED ORDER — DIPHENHYDRAMINE HCL 12.5 MG/5ML PO ELIX
12.5000 mg | ORAL_SOLUTION | ORAL | Status: DC | PRN
  Administered 2022-08-04: 25 mg via ORAL
  Filled 2022-08-03: qty 10

## 2022-08-03 MED ORDER — BUPIVACAINE-EPINEPHRINE (PF) 0.5% -1:200000 IJ SOLN
INTRAMUSCULAR | Status: DC | PRN
  Administered 2022-08-03: 20 mL via PERINEURAL

## 2022-08-03 MED ORDER — ONDANSETRON HCL 4 MG/2ML IJ SOLN
INTRAMUSCULAR | Status: DC | PRN
  Administered 2022-08-03: 4 mg via INTRAVENOUS

## 2022-08-03 MED ORDER — ONDANSETRON HCL 4 MG PO TABS: 4.0000 mg | ORAL_TABLET | Freq: Four times a day (QID) | ORAL | Status: DC | PRN

## 2022-08-03 MED ORDER — ALBUMIN HUMAN 5 % IV SOLN: INTRAVENOUS | Status: DC | PRN

## 2022-08-03 MED ORDER — VITAMIN D 25 MCG (1000 UNIT) PO TABS
5000.0000 [IU] | ORAL_TABLET | Freq: Every day | ORAL | Status: DC
  Administered 2022-08-03 – 2022-08-05 (×3): 5000 [IU] via ORAL
  Filled 2022-08-03 (×3): qty 5

## 2022-08-03 MED ORDER — METHOCARBAMOL 500 MG PO TABS
500.0000 mg | ORAL_TABLET | Freq: Four times a day (QID) | ORAL | Status: DC | PRN
  Administered 2022-08-03 – 2022-08-05 (×5): 500 mg via ORAL
  Filled 2022-08-03 (×5): qty 1

## 2022-08-03 MED ORDER — INSULIN ASPART 100 UNIT/ML IJ SOLN: 0.0000 [IU] | INTRAMUSCULAR | Status: DC | PRN

## 2022-08-03 MED ORDER — HYDROCODONE-ACETAMINOPHEN 5-325 MG PO TABS
ORAL_TABLET | ORAL | Status: AC
  Filled 2022-08-03: qty 2

## 2022-08-03 MED ORDER — 0.9 % SODIUM CHLORIDE (POUR BTL) OPTIME
TOPICAL | Status: DC | PRN
  Administered 2022-08-03: 1000 mL

## 2022-08-03 MED ORDER — MEPERIDINE HCL 25 MG/ML IJ SOLN
6.2500 mg | INTRAMUSCULAR | Status: DC | PRN
  Administered 2022-08-03: 6.25 mg via INTRAVENOUS
  Administered 2022-08-03: 12.5 mg via INTRAVENOUS

## 2022-08-03 MED ORDER — PROPOFOL 1000 MG/100ML IV EMUL
INTRAVENOUS | Status: AC
  Filled 2022-08-03: qty 100

## 2022-08-03 MED ORDER — ASPIRIN 81 MG PO CHEW
81.0000 mg | CHEWABLE_TABLET | Freq: Two times a day (BID) | ORAL | Status: DC
  Administered 2022-08-03 – 2022-08-05 (×4): 81 mg via ORAL
  Filled 2022-08-03 (×4): qty 1

## 2022-08-03 MED ORDER — CHLORTHALIDONE 25 MG PO TABS
25.0000 mg | ORAL_TABLET | Freq: Every day | ORAL | Status: DC
  Administered 2022-08-03 – 2022-08-05 (×3): 25 mg via ORAL
  Filled 2022-08-03 (×3): qty 1

## 2022-08-03 MED ORDER — LACTATED RINGERS IV SOLN: INTRAVENOUS | Status: DC

## 2022-08-03 MED ORDER — CEFAZOLIN SODIUM-DEXTROSE 2-4 GM/100ML-% IV SOLN
2.0000 g | INTRAVENOUS | Status: AC
  Administered 2022-08-03: 2 g via INTRAVENOUS
  Filled 2022-08-03: qty 100

## 2022-08-03 MED ORDER — ALUM & MAG HYDROXIDE-SIMETH 200-200-20 MG/5ML PO SUSP: 30.0000 mL | ORAL | Status: DC | PRN

## 2022-08-03 MED ORDER — ALBUMIN HUMAN 5 % IV SOLN
INTRAVENOUS | Status: AC
  Filled 2022-08-03: qty 250

## 2022-08-03 MED ORDER — HYDROCODONE-ACETAMINOPHEN 7.5-325 MG PO TABS
ORAL_TABLET | ORAL | Status: AC
  Filled 2022-08-03: qty 2

## 2022-08-03 MED ORDER — HYDROMORPHONE HCL 2 MG PO TABS
4.0000 mg | ORAL_TABLET | ORAL | Status: DC | PRN
  Administered 2022-08-04 – 2022-08-05 (×2): 4 mg via ORAL
  Filled 2022-08-03 (×2): qty 2

## 2022-08-03 MED ORDER — POVIDONE-IODINE 10 % EX SWAB: 2.0000 | Freq: Once | CUTANEOUS | Status: DC

## 2022-08-03 MED ORDER — PHENYLEPHRINE HCL-NACL 20-0.9 MG/250ML-% IV SOLN
INTRAVENOUS | Status: AC
  Filled 2022-08-03: qty 500

## 2022-08-03 MED ORDER — PROPOFOL 10 MG/ML IV BOLUS
INTRAVENOUS | Status: AC
  Filled 2022-08-03: qty 20

## 2022-08-03 MED ORDER — PHENYLEPHRINE HCL-NACL 20-0.9 MG/250ML-% IV SOLN
INTRAVENOUS | Status: DC | PRN
  Administered 2022-08-03: 45 ug/min via INTRAVENOUS

## 2022-08-03 MED ORDER — METOCLOPRAMIDE HCL 5 MG PO TABS: 5.0000 mg | ORAL_TABLET | Freq: Three times a day (TID) | ORAL | Status: DC | PRN

## 2022-08-03 MED ORDER — BUPIVACAINE IN DEXTROSE 0.75-8.25 % IT SOLN
INTRATHECAL | Status: DC | PRN
  Administered 2022-08-03: 1.8 mL via INTRATHECAL

## 2022-08-03 MED ORDER — SODIUM CHLORIDE 0.9 % IR SOLN
Status: DC | PRN
  Administered 2022-08-03: 1000 mL

## 2022-08-03 MED ORDER — KETOROLAC TROMETHAMINE 15 MG/ML IJ SOLN
7.5000 mg | Freq: Four times a day (QID) | INTRAMUSCULAR | Status: DC
  Administered 2022-08-03 – 2022-08-04 (×3): 7.5 mg via INTRAVENOUS
  Filled 2022-08-03 (×3): qty 1

## 2022-08-03 SURGICAL SUPPLY — 72 items
BAG COUNTER SPONGE SURGICOUNT (BAG) ×1 IMPLANT
BANDAGE ESMARK 6X9 LF (GAUZE/BANDAGES/DRESSINGS) ×1 IMPLANT
BLADE SAG 18X100X1.27 (BLADE) ×1 IMPLANT
BNDG ELASTIC 6X5.8 VLCR STR LF (GAUZE/BANDAGES/DRESSINGS) ×2 IMPLANT
BNDG ESMARK 6X9 LF (GAUZE/BANDAGES/DRESSINGS)
BOWL SMART MIX CTS (DISPOSABLE) ×1 IMPLANT
CEMENT BONE R 1X40 (Cement) ×2 IMPLANT
COOLER ICEMAN CLASSIC (MISCELLANEOUS) IMPLANT
COVER SURGICAL LIGHT HANDLE (MISCELLANEOUS) ×1 IMPLANT
CUFF TOURN SGL QUICK 34 (TOURNIQUET CUFF) ×1
CUFF TOURN SGL QUICK 42 (TOURNIQUET CUFF) IMPLANT
CUFF TRNQT CYL 34X4.125X (TOURNIQUET CUFF) ×1 IMPLANT
DRAPE EXTREMITY T 121X128X90 (DISPOSABLE) ×1 IMPLANT
DRAPE HALF SHEET 40X57 (DRAPES) ×1 IMPLANT
DRAPE U-SHAPE 47X51 STRL (DRAPES) ×1 IMPLANT
DURAPREP 26ML APPLICATOR (WOUND CARE) ×1 IMPLANT
ELECT CAUTERY BLADE 6.4 (BLADE) ×1 IMPLANT
ELECT REM PT RETURN 9FT ADLT (ELECTROSURGICAL) ×1
ELECTRODE REM PT RTRN 9FT ADLT (ELECTROSURGICAL) ×1 IMPLANT
FACESHIELD WRAPAROUND (MASK) ×2 IMPLANT
FACESHIELD WRAPAROUND OR TEAM (MASK) ×2 IMPLANT
FEMUR CMT CR STD SZ 6 RT KNEE (Joint) ×1 IMPLANT
FEMUR CMTD CR STD SZ 6 RT KNEE (Joint) IMPLANT
GAUZE PAD ABD 8X10 STRL (GAUZE/BANDAGES/DRESSINGS) ×1 IMPLANT
GAUZE SPONGE 4X4 12PLY STRL (GAUZE/BANDAGES/DRESSINGS) ×1 IMPLANT
GAUZE XEROFORM 1X8 LF (GAUZE/BANDAGES/DRESSINGS) ×1 IMPLANT
GLOVE BIOGEL PI IND STRL 8 (GLOVE) ×2 IMPLANT
GLOVE ORTHO TXT STRL SZ7.5 (GLOVE) ×1 IMPLANT
GLOVE SURG ORTHO 8.0 STRL STRW (GLOVE) ×1 IMPLANT
GOWN STRL REUS W/ TWL LRG LVL3 (GOWN DISPOSABLE) IMPLANT
GOWN STRL REUS W/ TWL XL LVL3 (GOWN DISPOSABLE) ×2 IMPLANT
GOWN STRL REUS W/TWL LRG LVL3 (GOWN DISPOSABLE)
GOWN STRL REUS W/TWL XL LVL3 (GOWN DISPOSABLE) ×2
HANDPIECE INTERPULSE COAX TIP (DISPOSABLE) ×1
HDLS TROCR DRIL PIN KNEE 75 (PIN) ×1
IMMOBILIZER KNEE 22 UNIV (SOFTGOODS) ×1 IMPLANT
IV NS 1000ML (IV SOLUTION) ×1
IV NS 1000ML BAXH (IV SOLUTION) ×1 IMPLANT
KIT BASIN OR (CUSTOM PROCEDURE TRAY) ×1 IMPLANT
KIT TURNOVER KIT B (KITS) ×1 IMPLANT
LINER TIB ASF PS CD/6-7 14 RT (Liner) IMPLANT
MANIFOLD NEPTUNE II (INSTRUMENTS) ×1 IMPLANT
NDL 18GX1X1/2 (RX/OR ONLY) (NEEDLE) IMPLANT
NEEDLE 18GX1X1/2 (RX/OR ONLY) (NEEDLE) IMPLANT
NS IRRIG 1000ML POUR BTL (IV SOLUTION) ×1 IMPLANT
PACK TOTAL JOINT (CUSTOM PROCEDURE TRAY) ×1 IMPLANT
PAD ARMBOARD 7.5X6 YLW CONV (MISCELLANEOUS) ×1 IMPLANT
PAD COLD SHLDR WRAP-ON (PAD) IMPLANT
PADDING CAST COTTON 6X4 STRL (CAST SUPPLIES) ×1 IMPLANT
PIN DRILL HDLS TROCAR 75 4PK (PIN) IMPLANT
SCREW FEMALE HEX FIX 25X2.5 (ORTHOPEDIC DISPOSABLE SUPPLIES) IMPLANT
SET HNDPC FAN SPRY TIP SCT (DISPOSABLE) ×1 IMPLANT
SET PAD KNEE POSITIONER (MISCELLANEOUS) ×1 IMPLANT
STAPLER VISISTAT 35W (STAPLE) ×1 IMPLANT
STEM POLY PAT PLY 29M KNEE (Knees) IMPLANT
STEM TIBIA 5 DEG SZ D R KNEE (Knees) IMPLANT
SUCTION FRAZIER HANDLE 10FR (MISCELLANEOUS) ×1
SUCTION TUBE FRAZIER 10FR DISP (MISCELLANEOUS) ×1 IMPLANT
SUT VIC AB 0 CT1 27 (SUTURE) ×2
SUT VIC AB 0 CT1 27XBRD ANBCTR (SUTURE) ×1 IMPLANT
SUT VIC AB 1 CT1 27 (SUTURE) ×3
SUT VIC AB 1 CT1 27XBRD ANBCTR (SUTURE) ×2 IMPLANT
SUT VIC AB 2-0 CT1 27 (SUTURE)
SUT VIC AB 2-0 CT1 TAPERPNT 27 (SUTURE) ×2 IMPLANT
SUT VIC AB 2-0 CTB1 (SUTURE) IMPLANT
SYR 50ML LL SCALE MARK (SYRINGE) IMPLANT
TIBIA STEM 5 DEG SZ D R KNEE (Knees) ×1 IMPLANT
TOWEL GREEN STERILE (TOWEL DISPOSABLE) ×1 IMPLANT
TOWEL GREEN STERILE FF (TOWEL DISPOSABLE) ×1 IMPLANT
TRAY CATH 16FR W/PLASTIC CATH (SET/KITS/TRAYS/PACK) IMPLANT
TRAY FOLEY W/BAG SLVR 14FR (SET/KITS/TRAYS/PACK) IMPLANT
WRAP KNEE MAXI GEL POST OP (GAUZE/BANDAGES/DRESSINGS) ×1 IMPLANT

## 2022-08-03 NOTE — Evaluation (Signed)
Physical Therapy Evaluation Patient Details Name: Lauren [name] MRN: [id number] DOB: [birth year] Today's Date: 08/03/2022  History of Present Illness  Pt is 76 yo female s/p R TKA on 08/03/22.  Pt with hx including but not limited to L TKA 2019, R THA 2018, anxiety, arthritis, Breast CA, DM,  Clinical Impression  Pt is s/p TKA resulting in the deficits listed below (see PT Problem List). At baseline, pt is independent.  She lives alone but daughter plans to stay with her at d/c.  Today, pt limited due to pain and R LE weakness.  She was able to take a couple steps at EOB.  Advised if up with nursing later to wear KI.  Required min to mod A for transfers today. Expected to progress well as pain controlled and nerve blocks continue to decrease. Pt will benefit from skilled PT to increase their independence and safety with mobility to allow discharge to the venue listed below.         Recommendations for follow up therapy are one component of a multi-disciplinary discharge planning process, led by the attending physician.  Recommendations may be updated based on patient status, additional functional criteria and insurance authorization.  Follow Up Recommendations Follow physician's recommendations for discharge plan and follow up therapies      Assistance Recommended at Discharge Frequent or constant Supervision/Assistance  Patient can return home with the following  A lot of help with walking and/or transfers;A lot of help with bathing/dressing/bathroom;Assistance with cooking/housework;Help with stairs or ramp for entrance    Equipment Recommendations None recommended by PT  Recommendations for Other Services       Functional Status Assessment Patient has had a recent decline in their functional status and demonstrates the ability to make significant improvements in function in a reasonable and predictable amount of time.     Precautions / Restrictions Precautions Precautions:  Fall Required Braces or Orthoses: Knee Immobilizer - Right Knee Immobilizer - Right: Discontinue once straight leg raise with < 10 degree lag Restrictions Weight Bearing Restrictions: Yes RLE Weight Bearing: Weight bearing as tolerated      Mobility  Bed Mobility Overal bed mobility: Needs Assistance Bed Mobility: Supine to Sit, Sit to Supine     Supine to sit: Mod assist, HOB elevated Sit to supine: Mod assist, HOB elevated   General bed mobility comments: Cues and assist for leg and trunk    Transfers Overall transfer level: Needs assistance Equipment used: Rolling walker (2 wheels) Transfers: Sit to/from Stand Sit to Stand: Min assist, From elevated surface           General transfer comment: cues for hand placement and R LE management    Ambulation/Gait Ambulation/Gait assistance: Min assist Gait Distance (Feet): 2 Feet Assistive device: Rolling walker (2 wheels) Gait Pattern/deviations: Step-to pattern, Decreased stride length, Decreased weight shift to right Gait velocity: decreased     General Gait Details: Side steps at EOB with R knee blocked; limited by pain  Stairs            Wheelchair Mobility    Modified Rankin (Stroke Patients Only)       Balance Overall balance assessment: Needs assistance Sitting-balance support: No upper extremity supported Sitting balance-[name]: Good     Standing balance support: Bilateral upper extremity supported, Reliant on assistive device for balance Standing balance-[name]: Poor  Pertinent Vitals/Pain Pain Assessment Pain Assessment: 0-10 Pain Score: 6  Pain Location: R leg Pain Descriptors / Indicators: Sore Pain Intervention(s): Limited activity within patient's tolerance, Monitored during session, Premedicated before session, Repositioned    Home Living Family/patient expects to be discharged to:: Private residence Living Arrangements:  Alone Available Help at Discharge: Family;Available 24 hours/day (daughter and son to stay initially) Type of Home: House Home Access: Stairs to enter Entrance Stairs-Rails: Right;Left;Can reach both Entrance Stairs-Number of Steps: 5   Home Layout: One level (does have 1 step from den to kitchen with hand bar on right) Home Equipment: Grab bars - tub/shower;Rolling Walker (2 wheels);Cane - single point;BSC/3in1      Prior Function Prior Level of Function : Independent/Modified Independent;Driving             Mobility Comments: could ambulate in community; no AD ADLs Comments: independent adls and IADLs     Hand Dominance        Extremity/Trunk Assessment   Upper Extremity Assessment Upper Extremity Assessment: Overall WFL for tasks assessed    Lower Extremity Assessment Lower Extremity Assessment: LLE deficits/detail;RLE deficits/detail RLE Deficits / Details: ROM: ankle and hip WFL, knee 5 to 50 degrees limited by pain; MMT: ankle 5/5, knee and hip 1/5 LLE Deficits / Details: ROM WFL; MMT 5/5    Cervical / Trunk Assessment Cervical / Trunk Assessment: Normal  Communication   Communication: No difficulties  Cognition Arousal/Alertness: Awake/alert Behavior During Therapy: WFL for tasks assessed/performed Overall Cognitive Status: Within Functional Limits for tasks assessed                                          General Comments      Exercises     Assessment/Plan    PT Assessment Patient needs continued PT services  PT Problem List Decreased strength;Decreased mobility;Decreased range of motion;Decreased activity tolerance;Decreased balance;Decreased knowledge of use of DME;Pain       PT Treatment Interventions DME instruction;Therapeutic activities;Modalities;Gait training;Therapeutic exercise;Patient/family education;Stair training;Balance training;Functional mobility training    PT Goals (Current goals can be found in the Care Plan  section)  Acute Rehab PT Goals Patient Stated Goal: return home (reports plan is thursday PT Goal Formulation: With patient/family Time For Goal Achievement: 08/17/22 Potential to Achieve Goals: Good    Frequency 7X/week     Co-evaluation               AM-PAC PT "6 Clicks" Mobility  Outcome Measure Help needed turning from your back to your side while in a flat bed without using bedrails?: A Little Help needed moving from lying on your back to sitting on the side of a flat bed without using bedrails?: A Lot Help needed moving to and from a bed to a chair (including a wheelchair)?: A Little Help needed standing up from a chair using your arms (e.g., wheelchair or bedside chair)?: A Little Help needed to walk in hospital room?: Total Help needed climbing 3-5 steps with a railing? : Total 6 Click Score: 13    End of Session Equipment Utilized During Treatment: Gait belt Activity Tolerance: Patient limited by pain Patient left: in bed;with call bell/phone within reach;with family/visitor present (In PACU) Nurse Communication: Mobility status PT Visit Diagnosis: Muscle weakness (generalized) (M62.81);Other abnormalities of gait and mobility (R26.89)    Time: 1450-1516 PT Time Calculation (min) (ACUTE ONLY): 26 min  Charges:   PT Evaluation $PT Eval Low Complexity: 1 Low PT Treatments $Therapeutic Activity: 8-22 mins        Abran Richard, PT Acute Rehab [facility] [phone]   Karlton Lemon 08/03/2022, 4:08 PM

## 2022-08-03 NOTE — Anesthesia Procedure Notes (Signed)
Anesthesia Regional Block: Adductor canal block   Pre-Anesthetic Checklist: , timeout performed,  Correct Patient, Correct Site, Correct Laterality,  Correct Procedure, Correct Position, site marked,  Risks and benefits discussed,  Pre-op evaluation,  At surgeon's request and post-op pain management  Laterality: Right  Prep: Maximum Sterile Barrier Precautions used, chloraprep       Needles:  Injection technique: Single-shot  Needle Type: Echogenic Stimulator Needle     Needle Length: 9cm  Needle Gauge: 21     Additional Needles:   Procedures:,,,, ultrasound used (permanent image in chart),,    Narrative:  Start time: 08/03/2022 6:55 AM End time: 08/03/2022 7:05 AM Injection made incrementally with aspirations every 5 mL.  Performed by: Personally  Anesthesiologist: Roderic Palau, MD

## 2022-08-03 NOTE — H&P (Signed)
TOTAL KNEE ADMISSION H&P  Patient is being admitted for right total knee arthroplasty.  Subjective:  Chief Complaint:right knee pain.  HPI: Lauren [name], 76 y.o. female, has a history of pain and functional disability in the right knee due to arthritis and has failed non-surgical conservative treatments for greater than 12 weeks to includeNSAID's and/or analgesics, corticosteriod injections, viscosupplementation injections, flexibility and strengthening excercises, supervised PT with diminished ADL's post treatment, use of assistive devices, weight reduction as appropriate, and activity modification.  Onset of symptoms was gradual, starting 3 years ago with gradually worsening course since that time. The patient noted no past surgery on the right knee(s).  Patient currently rates pain in the right knee(s) at 10 out of 10 with activity. Patient has night pain, worsening of pain with activity and weight bearing, pain that interferes with activities of daily living, pain with passive range of motion, crepitus, and joint swelling.  Patient has evidence of subchondral sclerosis, periarticular osteophytes, and joint space narrowing by imaging studies. There is no active infection.  Patient Active Problem List   Diagnosis Date Noted   Stress 07/19/2022   Other constipation 07/19/2022   Atypical chest pain 06/25/2021   Class 2 severe obesity with serious comorbidity and body mass index (BMI) of 37.0 to 37.9 in adult ([place]) 06/03/2021   Vitamin D deficiency 06/03/2021   Other fatigue 04/22/2021   SOB (shortness of breath) on exertion 04/22/2021   Lower extremity edema 03/05/2021   Coronary artery calcification 02/20/2020   Pure hypercholesterolemia 02/20/2020   Unilateral primary osteoarthritis, right knee 01/03/2019   Genetic testing 12/07/2018   Family history of breast cancer    Family history of prostate cancer    Family history of colon cancer    Family history of kidney cancer     Malignant neoplasm of upper-outer quadrant of right breast in female, estrogen receptor positive ([place]) 11/09/2018   Diabetic neuropathy, type II diabetes mellitus ([place]) 11/09/2018   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/02/2017   Trochanteric bursitis, right hip 05/10/2017   Status post total replacement of right hip 11/26/2016   DDD (degenerative disc disease), 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   Low back pain 04/23/2015   Hiatal hernia 04/13/2013   Diabetes mellitus ([place]) 34/19/3790   Umbilical hernia 24/07/7352   Essential hypertension    Past Medical History:  Diagnosis Date   Anxiety    Arthritis    Back, Hip- right   Atypical chest pain 06/25/2021   Back pain    Cancer ([facility])    right breast   Cataract    Colitis    Coronary artery calcification 02/20/2020   Diabetes mellitus without complication (HCC)    Type II   Edema 03/05/2021   Edema of both lower extremities    Endometriosis    Family history of adverse reaction to anesthesia    Father - N/V - blood pressure; daughter N/V   Family history of breast cancer    Family history of colon cancer    Family history of kidney cancer    Family history of prostate cancer    Fatty liver    GERD (gastroesophageal reflux disease)    History of hiatal hernia    History of kidney stones    History of ulcerative colitis    Hypertension    Insomnia    Joint pain    Kidney problem    Lower extremity edema 03/05/2021   Lumbar disc disease    L4 L5  Neuropathy    Osteoarthritis    Other hyperlipidemia    Palpitations    Personal history of radiation therapy    Pneumonia    1983, 2003   Pure hypercholesterolemia 02/20/2020   PVCs (premature ventricular contractions)    benign   Umbilical hernia    Vitamin D deficiency    Wears glasses     Past Surgical History:  Procedure Laterality Date   APPENDECTOMY     BREAST BIOPSY  08-13-2009  DR TSUEI   EXCISION LEFT NIPPLE DUCT   BREAST EXCISIONAL BIOPSY Left    x2   BREAST  EXCISIONAL BIOPSY Right    BREAST LUMPECTOMY Right 11/16/2018   invasive ductal   CHOLECYSTECTOMY  1992   COLON RESECTION  1986   ULCERATIVE COLITIS   COLON SURGERY     ESOPHAGEAL MANOMETRY N/A 06/11/2013   Procedure: ESOPHAGEAL MANOMETRY (EM);  Surgeon: Jeryl Columbia, MD;  Location: [facility];  Service: Endoscopy;  Laterality: N/A;   ESOPHAGOGASTRODUODENOSCOPY (EGD) WITH PROPOFOL N/A 05/31/2016   Procedure: ESOPHAGOGASTRODUODENOSCOPY (EGD) WITH PROPOFOL;  Surgeon: Clarene Essex, MD;  Location: [facility] ENDOSCOPY;  Service: Endoscopy;  Laterality: N/A;   EXCISION RIGHT BREAST MASS   10-20-2005  DR Collier Salina YOUNG   EYE SURGERY Bilateral 2023   cataract   FRACTURE SURGERY     left arm   kidney stone removed  12/2009   KNEE ARTHROSCOPY Left    MCL   LEFT THUMB CARPOMETACARPAL JOINT SUSPENSIONPLASTY  04-06-2006  DR Burney Gauze   LEFT WRIST ARTHROSCOPY W/ DEBRIDEMENT AND REMOVAL CYST  03-26-2004  DR Burney Gauze   MYOMECTOMY     RADIOACTIVE SEED GUIDED PARTIAL MASTECTOMY WITH AXILLARY SENTINEL LYMPH NODE BIOPSY Right 11/16/2018   Procedure: RIGHT BREAST RADIOACTIVE SEED GUIDED PARTIAL MASTECTOMY WITH  SENTINEL  NODE BIOPSY;  Surgeon: Coralie Keens, MD;  Location: [facility];  Service: General;  Laterality: Right;   RIGHT COLECTOMY     RIGHT URETEROSCOPIC STONE EXTRACTION  12-11-2009  DR Farrell HIP ARTHROPLASTY Right 11/26/2016   Procedure: RIGHT TOTAL HIP ARTHROPLASTY ANTERIOR APPROACH;  Surgeon: Mcarthur Rossetti, MD;  Location: [facility];  Service: Orthopedics;  Laterality: Right;   TOTAL KNEE ARTHROPLASTY Left 12/02/2017   Procedure: LEFT TOTAL KNEE ARTHROPLASTY;  Surgeon: Mcarthur Rossetti, MD;  Location: [facility];  Service: Orthopedics;  Laterality: Left;   WRIST SURGERY     both    Current Facility-Administered Medications  Medication Dose Route Frequency Provider Last Rate Last Admin   ceFAZolin (ANCEF) IVPB 2g/100 mL premix  2 g Intravenous  On Call to OR Pete Pelt, PA-C       insulin aspart (novoLOG) injection 0-14 Units  0-14 Units Subcutaneous Q2H PRN Roderic Palau, MD       lactated ringers infusion   Intravenous Continuous Effie Berkshire, MD   New Bag at 08/03/22 0645   povidone-iodine 10 % swab 2 Application  2 Application Topical Once Pete Pelt, PA-C       tranexamic acid (CYKLOKAPRON) IVPB 1,000 mg  1,000 mg Intravenous To OR Pete Pelt, PA-C       Facility-Administered Medications Ordered in Other Encounters  Medication Dose Route Frequency Provider Last Rate Last Admin   fentaNYL citrate (PF) (SUBLIMAZE) injection   Intravenous Anesthesia Intra-op Muqtasid, Samantha T, CRNA   100 mcg at 08/03/22 0701   Allergies  Allergen Reactions   Ciprofloxacin  Hcl Hives   Percocet [Oxycodone-Acetaminophen] Other (See Comments)    RESPIRATORY DISTRESS   Penicillins Hives and Other (See Comments)    Has patient had a PCN reaction causing immediate rash, facial/tongue/throat swelling, SOB or lightheadedness with hypotension: no Has patient had a PCN reaction causing severe rash involving mucus membranes or skin necrosis: no Has patient had a PCN reaction that required hospitalization no Has patient had a PCN reaction occurring within the last 10 years: no If all of the above answers are "NO", then may proceed with Cephalosporin use.   Sulfa Antibiotics Hives   Tape Other (See Comments)    Adhesives - Causes Blisters. No band aids, and plastic tape    Tetanus Toxoids Hives and Rash   Jardiance [Empagliflozin]     Severe UTI    Social History   Tobacco Use   Smoking status: Former    Packs/day: 1.00    Years: 10.00    Total pack years: 10.00    Types: Cigarettes    Quit date: 09/22/1982    Years since quitting: 39.8   Smokeless tobacco: Never   Tobacco comments:    quit 1983  Substance Use Topics   Alcohol use: No    Family History  Problem Relation Age of Onset   Hyperlipidemia Mother     Diabetes Mother    Hypertension Mother    Parkinson's disease Mother    Kidney disease Mother    Kidney disease Father    Stroke Father    Heart failure Father    Hypertension Father    Heart disease Father    Diabetes Father    Cancer Father        colon, prostate, and kidney   Heart disease Maternal Grandmother    Diabetes Maternal Grandmother    Heart disease Maternal Grandfather    Diabetes Maternal Grandfather    Cancer Maternal Grandfather        pt unaware of what kind   Stroke Paternal Grandmother    Stroke Paternal Grandfather    Cancer Maternal Aunt        cervical vs ovarian   Colon cancer Paternal Aunt        dx less than 47   Breast cancer Paternal Aunt        dx in her 76s   Prostate cancer Paternal Uncle    Breast cancer Cousin        pat first cousin, dx over 54   Prostate cancer Other        PGF's father     Review of Systems  Musculoskeletal:  Positive for gait problem and joint swelling.  All other systems reviewed and are negative.   Objective:  Physical Exam Vitals reviewed.  Constitutional:      Appearance: Normal appearance. She is obese.  HENT:     Head: Normocephalic and atraumatic.  Eyes:     Extraocular Movements: Extraocular movements intact.     Pupils: Pupils are equal, round, and reactive to light.  Cardiovascular:     Rate and Rhythm: Normal rate and regular rhythm.  Pulmonary:     Effort: Pulmonary effort is normal.     Breath sounds: Normal breath sounds.  Abdominal:     Palpations: Abdomen is soft.  Musculoskeletal:     Cervical back: Normal range of motion and neck supple.     Right knee: Bony tenderness and crepitus present. Decreased range of motion. Tenderness present over the medial joint line  and lateral joint line. Abnormal alignment and abnormal meniscus.  Neurological:     Mental Status: She is alert and oriented to person, place, and time.  Psychiatric:        Behavior: Behavior normal.     Vital signs in  last 24 hours: Temp:  [97.7 F (36.5 C)] 97.7 F (36.5 C) (10/03 0610) Pulse Rate:  [75] 75 (10/03 0610) Resp:  [17] 17 (10/03 0610) BP: (134)/(57) 134/57 (10/03 0610) SpO2:  [97 %] 97 % (10/03 0610) Weight:  [87.5 kg] 87.5 kg (10/03 0610)  Labs:   Estimated body mass index is 33.13 kg/m as calculated from the following:   Height as of this encounter: '5\' 4"'$  (1.626 m).   Weight as of this encounter: 87.5 kg.   Imaging Review Plain radiographs demonstrate severe degenerative joint disease of the right knee(s). The overall alignment ismild varus. The bone quality appears to be good for age and reported activity level.      Assessment/Plan:  End stage arthritis, right knee   The patient history, physical examination, clinical judgment of the provider and imaging studies are consistent with end stage degenerative joint disease of the right knee(s) and total knee arthroplasty is deemed medically necessary. The treatment options including medical management, injection therapy arthroscopy and arthroplasty were discussed at length. The risks and benefits of total knee arthroplasty were presented and reviewed. The risks due to aseptic loosening, infection, stiffness, patella tracking problems, thromboembolic complications and other imponderables were discussed. The patient acknowledged the explanation, agreed to proceed with the plan and consent was signed. Patient is being admitted for inpatient treatment for surgery, pain control, PT, OT, prophylactic antibiotics, VTE prophylaxis, progressive ambulation and ADL's and discharge planning. The patient is planning to be discharged home with home health services

## 2022-08-03 NOTE — Anesthesia Procedure Notes (Signed)
Procedure Name: MAC Date/Time: 08/03/2022 8:03 AM  Performed by: Darletta Moll, CRNAPre-anesthesia Checklist: Patient identified, Emergency Drugs available, Suction available and Patient being monitored Patient Re-evaluated:Patient Re-evaluated prior to induction Oxygen Delivery Method: Simple face mask

## 2022-08-03 NOTE — Anesthesia Postprocedure Evaluation (Signed)
Anesthesia Post Note  Patient: Lauren [name]  Procedure(s) Performed: RIGHT TOTAL KNEE ARTHROPLASTY (Right: Knee)     Patient location during evaluation: PACU Anesthesia Type: Spinal and Regional Level of consciousness: oriented and awake and alert Pain management: pain level controlled Vital Signs Assessment: post-procedure vital signs reviewed and stable Respiratory status: spontaneous breathing and respiratory function stable Cardiovascular status: blood pressure returned to baseline and stable Postop Assessment: no headache, no backache, no apparent nausea or vomiting, spinal receding and patient able to bend at knees Anesthetic complications: no   No notable events documented.  Last Vitals:  Vitals:   08/03/22 1115 08/03/22 1221  BP: (!) 104/53 106/61  Pulse: (!) 59 64  Resp: 12 20  Temp:    SpO2: 96% 99%    Last Pain:  Vitals:   08/03/22 1221  TempSrc:   PainSc: 5                  Enio Hornback,W. EDMOND

## 2022-08-03 NOTE — Transfer of Care (Signed)
Immediate Anesthesia Transfer of Care Note  Patient: Lauren [name]  Procedure(s) Performed: RIGHT TOTAL KNEE ARTHROPLASTY (Right: Knee)  Patient Location: PACU  Anesthesia Type:Regional and Spinal  Level of Consciousness: drowsy and patient cooperative  Airway & Oxygen Therapy: Patient Spontanous Breathing and Patient connected to nasal cannula oxygen  Post-op Assessment: Report given to RN, Post -op Vital signs reviewed and stable and Patient moving all extremities X 4  Post vital signs: Reviewed and stable  Last Vitals:  Vitals Value Taken Time  BP 94/42 08/03/22 0946  Temp    Pulse 69 08/03/22 0949  Resp 12 08/03/22 0949  SpO2 99 % 08/03/22 0949  Vitals shown include unvalidated device data.  Last Pain:  Vitals:   08/03/22 0627  TempSrc:   PainSc: 3       Patients Stated Pain Goal: 0 (34/62/19 4712)  Complications: No notable events documented.   Giving Albumin, Dr. Ola Spurr aware

## 2022-08-03 NOTE — Op Note (Signed)
Operative Note  Date of operation: 08/03/2022 Preoperative diagnosis: Right knee osteoarthritis Postoperative diagnosis: Same  Procedure: Right cemented total knee arthroplasty  Implants: Biomet/Zimmer persona cemented knee system with size 6 CR standard right femur, size D right tibial tray, 14 mm medial congruent right fixed-bearing polythene insert, 29 mm patella button  Surgeon: Lind Guest. [name], MD Assistant: Benita Stabile, PA-C  Anesthesia: #1 regional right lower extremity block, #2 spinal EBL: Less than 100 cc Tourniquet time: 1 hour Antibiotics: 2 g IV Ancef Complications: None  Indications: The patient is a 76 year old female with well-documented debilitating arthritis involving her right knee.  She has tried and failed conservative treatment for over 12 months including activity modification, weight loss, using assistive device for offloading her right knee, steroid injections and hyaluronic acid injections.  At this point her right knee pain is daily and it is detrimentally affecting her mobility, her quality of life and her actives daily living.  We have replaced her left knee remotely in the past.  Having had the surgery before she is fully aware of the risk of acute blood loss anemia, nerve vessel injury, fracture, infection, DVT, implant failure and soft tissue wound issues.  She understands her goals are decreased pain, improve mobility, and improve quality of life.  Seizure description: After informed consent was obtained and the right lower extremity was marked, anesthesia obtained a right lower extremity regional block in the holding room.  She was then brought to the operating room and set up on the operating table where spinal anesthesia was obtained.  She was then laid in supine position on the operating table and a Foley catheter was placed.  A nonsterile tourniquet is placed around her upper right thigh.  Her right thigh, knee, leg, ankle and foot were prepped and  draped with DuraPrep and sterile drapes including a sterile stockinette.  A timeout was called and she was identified as the correct patient and the correct right knee.  We then used Esmarch to wrap out the leg and the tourniquet was inflated to 300 mm of pressure.  A midline incision was then made over the patella and carried proximally distally.  Dissection was carried down to the knee joint and a medial parapatellar arthrotomy was carried out.  There was a large joint effusion encountered.  With the knee in a flexed position we found significant cartilage loss in all 3 compartments of the knee.  Osteophytes were removed from all 3 compartments as well as remnants of the ACL and medial and lateral meniscus.  We then used extramedullary cutting guide for making her proximal tibia cut correction for varus and valgus and 3 degree slope.  We made this cut to take 2 mm off the low side.  We made the cut without difficulty.  We then used a intramedullary guide for making our distal femoral cut setting this for right knee and 5 degrees externally rotated for a 10 mm distal femoral cut.  We then brought the knee back down full extension and the 10 mm extension block actually showed her hyperextending slightly.  We then went back to the femur and put a femoral sizing guide based off the epicondylar axis.  From this we chose a size 6 femur.  We put a 4-in-1 cutting block for size for 6 femur and made her anterior posterior cuts followed our chamfer cuts.  We then back to the tibia and chose a size D right tibial tray for coverage over the tibial plateau  and set the rotation of the tibial tubercle and the femur.  We then made our keel punch and drill hole off of this.  We then trialed our size D right tibial tray followed by our size 6 right CR standard femur.  We went up to a 14 mm medial congruent polythene insert trial for right knee we are pleased with range of motion and stability of this trial implant and poly.  We  then made a patella cut and drilled 3 holes for a size 29 patella button.  With all transportation the again we are pleased with range of motion and stability.  We then removed all transportation of the knee and irrigate the knee with normal saline solution.  We then mixed our cement and with the knee in a flexed position cemented our Biomet/Zimmer persona tibial tray size D for a right knee followed by cementing our size 6 right CR standard femur.  We placed our medial congruent 14 mm fixed-bearing right polythene insert and cemented our size 29 patella button.  We held the knee compressed and fully extended while the cement hardened.  Once it hardened we let the tourniquet down hemostasis was obtained electrocautery.  The arthrotomy was closed with interrupted #1 Vicryl suture followed by 0 Vicryl close the deep tissue and 2-0 Vicryl to close the subcutaneous tissue.  The skin was closed with staples.  Well-padded sterile dressing was applied.  The patient was taken recovery in stable addition with all final counts being correct and no complications noted.  Of note Benita Stabile, PA-C did assist the entire case from beginning to end his assistance was medically necessary for soft tissue retraction and management as well as helping guide implant placement and a layered closure of the wound.

## 2022-08-03 NOTE — Plan of Care (Signed)

## 2022-08-03 NOTE — Anesthesia Procedure Notes (Signed)
Spinal  Patient location during procedure: OR Start time: 08/03/2022 7:33 AM End time: 08/03/2022 7:38 AM Reason for block: surgical anesthesia Staffing Performed: anesthesiologist  Anesthesiologist: Roderic Palau, MD Performed by: Roderic Palau, MD Authorized by: Roderic Palau, MD   Preanesthetic Checklist Completed: patient identified, IV checked, risks and benefits discussed, surgical consent, monitors and equipment checked, pre-op evaluation and timeout performed Spinal Block Patient position: sitting Prep: DuraPrep Patient monitoring: cardiac monitor, continuous pulse ox and blood pressure Approach: right paramedian (Attempted midline at L2-3 and L3-4.) Location: L3-4 Injection technique: single-shot Needle Needle type: Quincke (Attempted Pencan 24G. Unable to locate space)  Needle gauge: 22 G Needle length: 9 cm Assessment Sensory level: T8 Events: CSF return Additional Notes Functioning IV was confirmed and monitors were applied. Sterile prep and drape, including hand hygiene and sterile gloves were used. The patient was positioned and the spine was prepped. The skin was anesthetized with lidocaine.  Free flow of clear CSF was obtained prior to injecting local anesthetic into the CSF.  The spinal needle aspirated freely following injection.  The needle was carefully withdrawn.  The patient tolerated the procedure well.

## 2022-08-04 ENCOUNTER — Encounter (HOSPITAL_COMMUNITY): Payer: Self-pay | Admitting: Orthopaedic Surgery

## 2022-08-04 ENCOUNTER — Telehealth: Payer: Self-pay

## 2022-08-04 DIAGNOSIS — F419 Anxiety disorder, unspecified: Secondary | ICD-10-CM | POA: Diagnosis present

## 2022-08-04 DIAGNOSIS — I10 Essential (primary) hypertension: Secondary | ICD-10-CM | POA: Diagnosis present

## 2022-08-04 DIAGNOSIS — I2510 Atherosclerotic heart disease of native coronary artery without angina pectoris: Secondary | ICD-10-CM | POA: Diagnosis present

## 2022-08-04 DIAGNOSIS — D62 Acute posthemorrhagic anemia: Secondary | ICD-10-CM | POA: Diagnosis present

## 2022-08-04 DIAGNOSIS — N809 Endometriosis, unspecified: Secondary | ICD-10-CM | POA: Diagnosis present

## 2022-08-04 DIAGNOSIS — M5137 Other intervertebral disc degeneration, lumbosacral region: Secondary | ICD-10-CM | POA: Diagnosis present

## 2022-08-04 DIAGNOSIS — Z794 Long term (current) use of insulin: Secondary | ICD-10-CM | POA: Diagnosis not present

## 2022-08-04 DIAGNOSIS — K219 Gastro-esophageal reflux disease without esophagitis: Secondary | ICD-10-CM | POA: Diagnosis present

## 2022-08-04 DIAGNOSIS — M1611 Unilateral primary osteoarthritis, right hip: Secondary | ICD-10-CM | POA: Diagnosis present

## 2022-08-04 DIAGNOSIS — G4700 Insomnia, unspecified: Secondary | ICD-10-CM | POA: Diagnosis present

## 2022-08-04 DIAGNOSIS — Z8051 Family history of malignant neoplasm of kidney: Secondary | ICD-10-CM | POA: Diagnosis not present

## 2022-08-04 DIAGNOSIS — Z8719 Personal history of other diseases of the digestive system: Secondary | ICD-10-CM | POA: Diagnosis not present

## 2022-08-04 DIAGNOSIS — Z803 Family history of malignant neoplasm of breast: Secondary | ICD-10-CM | POA: Diagnosis not present

## 2022-08-04 DIAGNOSIS — Z87442 Personal history of urinary calculi: Secondary | ICD-10-CM | POA: Diagnosis not present

## 2022-08-04 DIAGNOSIS — Z87891 Personal history of nicotine dependence: Secondary | ICD-10-CM | POA: Diagnosis not present

## 2022-08-04 DIAGNOSIS — E1142 Type 2 diabetes mellitus with diabetic polyneuropathy: Secondary | ICD-10-CM | POA: Diagnosis present

## 2022-08-04 DIAGNOSIS — Z853 Personal history of malignant neoplasm of breast: Secondary | ICD-10-CM | POA: Diagnosis not present

## 2022-08-04 DIAGNOSIS — M1909 Primary osteoarthritis, other specified site: Secondary | ICD-10-CM | POA: Diagnosis present

## 2022-08-04 DIAGNOSIS — E7800 Pure hypercholesterolemia, unspecified: Secondary | ICD-10-CM | POA: Diagnosis present

## 2022-08-04 DIAGNOSIS — M1711 Unilateral primary osteoarthritis, right knee: Secondary | ICD-10-CM | POA: Diagnosis present

## 2022-08-04 DIAGNOSIS — M179 Osteoarthritis of knee, unspecified: Secondary | ICD-10-CM | POA: Diagnosis present

## 2022-08-04 DIAGNOSIS — K760 Fatty (change of) liver, not elsewhere classified: Secondary | ICD-10-CM | POA: Diagnosis present

## 2022-08-04 DIAGNOSIS — Z800 Family history of malignant neoplasm of digestive organs: Secondary | ICD-10-CM | POA: Diagnosis not present

## 2022-08-04 DIAGNOSIS — Z8042 Family history of malignant neoplasm of prostate: Secondary | ICD-10-CM | POA: Diagnosis not present

## 2022-08-04 LAB — CBC
HCT: 27.9 % — ABNORMAL LOW (ref 36.0–46.0)
Hemoglobin: 9.5 g/dL — ABNORMAL LOW (ref 12.0–15.0)
MCH: 30.3 pg (ref 26.0–34.0)
MCHC: 34.1 g/dL (ref 30.0–36.0)
MCV: 88.9 fL (ref 80.0–100.0)
Platelets: 205 10*3/uL (ref 150–400)
RBC: 3.14 MIL/uL — ABNORMAL LOW (ref 3.87–5.11)
RDW: 13.6 % (ref 11.5–15.5)
WBC: 7.8 10*3/uL (ref 4.0–10.5)
nRBC: 0 % (ref 0.0–0.2)

## 2022-08-04 LAB — BASIC METABOLIC PANEL
Anion gap: 10 (ref 5–15)
BUN: 16 mg/dL (ref 8–23)
CO2: 24 mmol/L (ref 22–32)
Calcium: 8.6 mg/dL — ABNORMAL LOW (ref 8.9–10.3)
Chloride: 101 mmol/L (ref 98–111)
Creatinine, Ser: 1.11 mg/dL — ABNORMAL HIGH (ref 0.44–1.00)
GFR, Estimated: 52 mL/min — ABNORMAL LOW (ref >60)
Glucose, Bld: 127 mg/dL — ABNORMAL HIGH (ref 70–99)
Potassium: 3.8 mmol/L (ref 3.5–5.1)
Sodium: 135 mmol/L (ref 135–145)

## 2022-08-04 NOTE — Progress Notes (Signed)
Physical Therapy Treatment Patient Details Name: Lauren [name] MRN: [id number] DOB: [birth year] Today's Date: 08/04/2022   History of Present Illness Pt is 76 yo female s/p R TKA on 08/03/22.  Pt with hx including but not limited to L TKA 2019, R THA 2018, anxiety, arthritis, Breast CA, DM,    PT Comments    Pt with gradual progress but limited by pain.  She did walk 6' but with labored pattern and small steps for pain control.  Performed gentle exercises to promote ROM and blood flow with assist as needed for pain control. Continue to progress as able.     Recommendations for follow up therapy are one component of a multi-disciplinary discharge planning process, led by the attending physician.  Recommendations may be updated based on patient status, additional functional criteria and insurance authorization.  Follow Up Recommendations  Follow physician's recommendations for discharge plan and follow up therapies     Assistance Recommended at Discharge Frequent or constant Supervision/Assistance  Patient can return home with the following A lot of help with walking and/or transfers;A lot of help with bathing/dressing/bathroom;Assistance with cooking/housework;Help with stairs or ramp for entrance   Equipment Recommendations  None recommended by PT    Recommendations for Other Services       Precautions / Restrictions Precautions Precautions: Fall Required Braces or Orthoses: Knee Immobilizer - Right Knee Immobilizer - Right: Discontinue once straight leg raise with < 10 degree lag Restrictions Weight Bearing Restrictions: Yes RLE Weight Bearing: Weight bearing as tolerated     Mobility  Bed Mobility Overal bed mobility: Needs Assistance Bed Mobility: Supine to Sit     Supine to sit: Min assist     General bed mobility comments: assist for R leg; increased time    Transfers Overall transfer level: Needs assistance Equipment used: Rolling walker (2  wheels) Transfers: Bed to chair/wheelchair/BSC Sit to Stand: Min assist, From elevated surface   Step pivot transfers: Min assist, From elevated surface       General transfer comment: cues for hand placement and R LE management - increased time to rise.  Stood from bed and bsc. Required min assist with adls    Ambulation/Gait Ambulation/Gait assistance: Min assist Gait Distance (Feet): 6 Feet Assistive device: Rolling walker (2 wheels) Gait Pattern/deviations: Step-to pattern, Decreased stride length, Decreased weight shift to right Gait velocity: decreased     General Gait Details: Cues for RW proximity, sequencing, and RW use.  Also encouraged small steps with step to pattern for pain control and stability with R leg   Stairs             Wheelchair Mobility    Modified Rankin (Stroke Patients Only)       Balance Overall balance assessment: Needs assistance Sitting-balance support: No upper extremity supported Sitting balance-[name]: Good     Standing balance support: Bilateral upper extremity supported, Reliant on assistive device for balance Standing balance-[name]: Poor                              Cognition Arousal/Alertness: Awake/alert Behavior During Therapy: WFL for tasks assessed/performed Overall Cognitive Status: Within Functional Limits for tasks assessed                                          Exercises Total Joint Exercises  Ankle Circles/Pumps: AROM, Both, 15 reps, Supine Quad Sets: AROM, Both, 10 reps, Supine Knee Flexion: AAROM, Right, 5 reps, Seated (gentle ROM due to pain; 5 sec hold) Goniometric ROM: R knee ~ 10 to 60 degrees    General Comments        Pertinent Vitals/Pain Pain Assessment Pain Assessment: 0-10 Pain Score: 6  (6/10 rest; 8/10 activity) Pain Location: R leg Pain Descriptors / Indicators: Sore Pain Intervention(s): Limited activity within patient's tolerance, Monitored  during session, Repositioned, Other (comment), Premedicated before session (declined ice)    Home Living                          Prior Function            PT Goals (current goals can now be found in the care plan section) Progress towards PT goals: Progressing toward goals    Frequency    7X/week      PT Plan Current plan remains appropriate    Co-evaluation              AM-PAC PT "6 Clicks" Mobility   Outcome Measure  Help needed turning from your back to your side while in a flat bed without using bedrails?: A Little Help needed moving from lying on your back to sitting on the side of a flat bed without using bedrails?: A Little Help needed moving to and from a bed to a chair (including a wheelchair)?: A Little Help needed standing up from a chair using your arms (e.g., wheelchair or bedside chair)?: A Little Help needed to walk in hospital room?: Total Help needed climbing 3-5 steps with a railing? : Total 6 Click Score: 14    End of Session Equipment Utilized During Treatment: Gait belt Activity Tolerance: Patient limited by pain Patient left: with call bell/phone within reach;in chair Nurse Communication: Mobility status PT Visit Diagnosis: Muscle weakness (generalized) (M62.81);Other abnormalities of gait and mobility (R26.89)     Time: 7425-9563 PT Time Calculation (min) (ACUTE ONLY): 39 min  Charges:  $Gait Training: 8-22 mins $Therapeutic Exercise: 8-22 mins $Therapeutic Activity: 8-22 mins                     Abran Richard, PT Acute Rehab [facility] [place] 08/04/2022, 12:48 PM

## 2022-08-04 NOTE — Telephone Encounter (Signed)
Debbie with [facility] would like an order for Inpatient.  Cb# [phone].  Please advise.  Thank you.

## 2022-08-04 NOTE — Progress Notes (Signed)
Subjective: 1 Day Post-Op Procedure(s) (LRB): RIGHT TOTAL KNEE ARTHROPLASTY (Right) Patient reports pain as moderate.  Had a rough evening with pain control.  She is on chronic pain medications.  Her hemoglobin this morning was 9.5 but this is only a slight drop in her preoperative hemoglobin which just slight acute on chronic blood loss anemia.  Her vital signs are stable.  Objective: Vital signs in last 24 hours: Temp:  [97.6 F (36.4 C)-99.1 F (37.3 C)] 98.9 F (37.2 C) (10/04 0639) Pulse Rate:  [59-79] 79 (10/04 0639) Resp:  [12-20] 16 (10/04 0639) BP: (92-132)/(42-99) 120/63 (10/04 0639) SpO2:  [95 %-100 %] 95 % (10/04 0639)  Intake/Output from previous day: 10/03 0701 - 10/04 0700 In: 2332.4 [I.V.:1932.4; IV Piggyback:400] Out: 2119 [Urine:1000; Blood:15] Intake/Output this shift: No intake/output data recorded.  Recent Labs    08/04/22 0331  HGB 9.5*   Recent Labs    08/04/22 0331  WBC 7.8  RBC 3.14*  HCT 27.9*  PLT 205   Recent Labs    08/04/22 0331  NA 135  K 3.8  CL 101  CO2 24  BUN 16  CREATININE 1.11*  GLUCOSE 127*  CALCIUM 8.6*   No results for input(s): "LABPT", "INR" in the last 72 hours.  Sensation intact distally I changed her dressing and her incision looks good. She is able to flex and extend her foot.  Assessment/Plan: 1 Day Post-Op Procedure(s) (LRB): RIGHT TOTAL KNEE ARTHROPLASTY (Right) Up with therapy Plan for discharge tomorrow Discharge home with home health      Lauren [name] 08/04/2022, 7:58 AM

## 2022-08-04 NOTE — Discharge Instructions (Signed)

## 2022-08-04 NOTE — Progress Notes (Signed)
Physical Therapy Treatment Patient Details Name: Lauren [name] MRN: [id number] DOB: 04/10/1946 Today's Date: 08/04/2022   History of Present Illness Pt is 76 yo female s/p R TKA on 08/03/22.  Pt with hx including but not limited to L TKA 2019, R THA 2018, anxiety, arthritis, Breast CA, DM,    PT Comments    Pt making gradual progress this afternoon.  Reports some improvement in pain (although still limited by pain). She was able to ambulate 24' with min assist and min A for transfers.  Daughter present - educated on transfer techniques and how to assist pt.  Did elevate leg on pillow for comfort and edema control but educated pt on making sure if pillow under leg that knee is still straight.  Recommend continued therapy prior to return home.     Recommendations for follow up therapy are one component of a multi-disciplinary discharge planning process, led by the attending physician.  Recommendations may be updated based on patient status, additional functional criteria and insurance authorization.  Follow Up Recommendations  Follow physician's recommendations for discharge plan and follow up therapies     Assistance Recommended at Discharge Frequent or constant Supervision/Assistance  Patient can return home with the following A lot of help with walking and/or transfers;A lot of help with bathing/dressing/bathroom;Assistance with cooking/housework;Help with stairs or ramp for entrance   Equipment Recommendations  None recommended by PT    Recommendations for Other Services       Precautions / Restrictions Precautions Precautions: Fall Required Braces or Orthoses: Knee Immobilizer - Right Knee Immobilizer - Right: Discontinue once straight leg raise with < 10 degree lag Restrictions RLE Weight Bearing: Weight bearing as tolerated     Mobility  Bed Mobility Overal bed mobility: Needs Assistance Bed Mobility: Supine to Sit     Supine to sit: Min assist Sit to supine: Min  assist   General bed mobility comments: assist for R leg; increased time and cues for sequencing    Transfers Overall transfer level: Needs assistance Equipment used: Rolling walker (2 wheels) Transfers: Bed to chair/wheelchair/BSC Sit to Stand: Min assist, From elevated surface   Step pivot transfers: Min assist, From elevated surface       General transfer comment: cues for hand placement and R LE management - increased time to rise.  Stood from bed and toilet. Required min assist with adls    Ambulation/Gait Ambulation/Gait assistance: Min assist Gait Distance (Feet): 24 Feet Assistive device: Rolling walker (2 wheels) Gait Pattern/deviations: Step-to pattern, Decreased stride length, Decreased weight shift to right Gait velocity: decreased     General Gait Details: Cues for RW proximity, sequencing, and RW use.  Also encouraged small steps with step to pattern for pain control and stability with R leg   Stairs             Wheelchair Mobility    Modified Rankin (Stroke Patients Only)       Balance Overall balance assessment: Needs assistance Sitting-balance support: No upper extremity supported Sitting balance-[name]: Good     Standing balance support: Bilateral upper extremity supported, Reliant on assistive device for balance Standing balance-[name]: Poor                              Cognition Arousal/Alertness: Awake/alert Behavior During Therapy: WFL for tasks assessed/performed Overall Cognitive Status: Within Functional Limits for tasks assessed  Exercises Total Joint Exercises Ankle Circles/Pumps: AROM, Both, Supine, 10 reps Quad Sets: AROM, Both, 10 reps, Supine Knee Flexion: AAROM, Right, 5 reps, Seated (gentle ROM due to pain; 5 sec hold) Goniometric ROM: R knee ~10 to 60 degrees    General Comments        Pertinent Vitals/Pain Pain Assessment Pain  Assessment: 0-10 Pain Score: 6  Pain Location: R leg Pain Descriptors / Indicators: Sore Pain Intervention(s): Limited activity within patient's tolerance, Monitored during session, Premedicated before session, Repositioned, Ice applied    Home Living                          Prior Function            PT Goals (current goals can now be found in the care plan section) Progress towards PT goals: Progressing toward goals    Frequency    7X/week      PT Plan Current plan remains appropriate    Co-evaluation              AM-PAC PT "6 Clicks" Mobility   Outcome Measure  Help needed turning from your back to your side while in a flat bed without using bedrails?: A Little Help needed moving from lying on your back to sitting on the side of a flat bed without using bedrails?: A Little Help needed moving to and from a bed to a chair (including a wheelchair)?: A Little Help needed standing up from a chair using your arms (e.g., wheelchair or bedside chair)?: A Little Help needed to walk in hospital room?: A Little Help needed climbing 3-5 steps with a railing? : Total 6 Click Score: 16    End of Session Equipment Utilized During Treatment: Gait belt Activity Tolerance: Patient limited by pain Patient left: with call bell/phone within reach;in chair Nurse Communication: Mobility status PT Visit Diagnosis: Muscle weakness (generalized) (M62.81);Other abnormalities of gait and mobility (R26.89)     Time: 7824-2353 PT Time Calculation (min) (ACUTE ONLY): 42 min  Charges:  $Gait Training: 8-22 mins $Therapeutic Exercise: 8-22 mins $Therapeutic Activity: 8-22 mins                     Abran Richard, PT Acute Rehab [facility] [place] 08/04/2022, 4:42 PM

## 2022-08-05 LAB — GLUCOSE, CAPILLARY
Glucose-Capillary: 115 mg/dL — ABNORMAL HIGH (ref 70–99)
Glucose-Capillary: 140 mg/dL — ABNORMAL HIGH (ref 70–99)

## 2022-08-05 MED ORDER — ASPIRIN 81 MG PO CHEW: 81.0000 mg | CHEWABLE_TABLET | Freq: Two times a day (BID) | ORAL | 0 refills | Status: DC

## 2022-08-05 MED ORDER — HYDROMORPHONE HCL 4 MG PO TABS: 4.0000 mg | ORAL_TABLET | Freq: Four times a day (QID) | ORAL | 0 refills | Status: DC | PRN

## 2022-08-05 NOTE — Progress Notes (Signed)
Subjective: 2 Days Post-Op Procedure(s) (LRB): RIGHT TOTAL KNEE ARTHROPLASTY (Right) Patient reports pain as moderate to severe. Progressing well with PT.   Objective: Vital signs in last 24 hours: Temp:  [98.3 F (36.8 C)-98.8 F (37.1 C)] 98.3 F (36.8 C) (10/05 0746) Pulse Rate:  [67-80] 75 (10/05 0746) Resp:  [17-19] 17 (10/05 0746) BP: (108-121)/(49-61) 121/61 (10/05 0746) SpO2:  [96 %-99 %] 99 % (10/05 0746)  Intake/Output from previous day: 10/04 0701 - 10/05 0700 In: 195.6 [I.V.:195.6] Out: 300 [Urine:300] Intake/Output this shift: No intake/output data recorded.  Recent Labs    08/04/22 0331  HGB 9.5*   Recent Labs    08/04/22 0331  WBC 7.8  RBC 3.14*  HCT 27.9*  PLT 205   Recent Labs    08/04/22 0331  NA 135  K 3.8  CL 101  CO2 24  BUN 16  CREATININE 1.11*  GLUCOSE 127*  CALCIUM 8.6*   No results for input(s): "LABPT", "INR" in the last 72 hours.  Dorsiflexion/Plantar flexion intact Incision: dressing C/D/I   Assessment/Plan: 2 Days Post-Op Procedure(s) (LRB): RIGHT TOTAL KNEE ARTHROPLASTY (Right) Up with therapy Discharge home with home health to remains stable and does with PT.       Lauren [name] 08/05/2022, 10:02 AM

## 2022-08-05 NOTE — Discharge Summary (Signed)
Patient ID: Lauren [name] MRN: [id number] DOB/AGE: [birth year] 76 y.o.  Admit date: 08/03/2022 Discharge date: 08/05/2022  Admission Diagnoses:  Principal Problem:   Unilateral primary osteoarthritis, right knee Active Problems:   OA (osteoarthritis) of knee   Status post total right knee replacement   Discharge Diagnoses:  Same  Past Medical History:  Diagnosis Date   Anxiety    Arthritis    Back, Hip- right   Atypical chest pain 06/25/2021   Back pain    Cancer ([place])    right breast   Cataract    Colitis    Coronary artery calcification 02/20/2020   Diabetes mellitus without complication (HCC)    Type II   Edema 03/05/2021   Edema of both lower extremities    Endometriosis    Family history of adverse reaction to anesthesia    Father - N/V - blood pressure; daughter N/V   Family history of breast cancer    Family history of colon cancer    Family history of kidney cancer    Family history of prostate cancer    Fatty liver    GERD (gastroesophageal reflux disease)    History of hiatal hernia    History of kidney stones    History of ulcerative colitis    Hypertension    Insomnia    Joint pain    Kidney problem    Lower extremity edema 03/05/2021   Lumbar disc disease    L4 L5   Neuropathy    Osteoarthritis    Other hyperlipidemia    Palpitations    Personal history of radiation therapy    Pneumonia    1983, 2003   Pure hypercholesterolemia 02/20/2020   PVCs (premature ventricular contractions)    benign   Umbilical hernia    Vitamin D deficiency    Wears glasses     Surgeries: Procedure(s): RIGHT TOTAL KNEE ARTHROPLASTY on 08/03/2022   Consultants:   Discharged Condition: Improved  Hospital Course: Lauren [name] is an 76 y.o. female who was admitted 08/03/2022 for operative treatment ofUnilateral primary osteoarthritis, right knee. Patient has severe unremitting pain that affects sleep, daily activities, and work/hobbies. After pre-op  clearance the patient was taken to the operating room on 08/03/2022 and underwent  Procedure(s): RIGHT TOTAL KNEE ARTHROPLASTY.    Patient was given perioperative antibiotics:  Anti-infectives (From admission, onward)    Start     Dose/Rate Route Frequency Ordered Stop   08/03/22 1800  vancomycin (VANCOCIN) IVPB 1000 mg/200 mL premix        1,000 mg 200 mL/hr over 60 Minutes Intravenous Every 12 hours 08/03/22 1533 08/03/22 1758   08/03/22 0600  ceFAZolin (ANCEF) IVPB 2g/100 mL premix        2 g 200 mL/hr over 30 Minutes Intravenous On call to O.R. 08/03/22 0547 08/03/22 0800        Patient was given sequential compression devices, early ambulation, and chemoprophylaxis to prevent DVT.  Patient benefited maximally from hospital stay and there were no complications.    Recent vital signs: Patient Vitals for the past 24 hrs:  BP Temp Temp src Pulse Resp SpO2  08/05/22 0746 121/61 98.3 F (36.8 C) Oral 75 17 99 %  08/04/22 2153 (!) 108/49 98.3 F (36.8 C) -- 80 -- 98 %  08/04/22 1239 (!) 116/59 98.8 F (37.1 C) -- 67 19 96 %     Recent laboratory studies:  Recent Labs    08/04/22 0331  WBC 7.8  HGB 9.5*  HCT 27.9*  PLT 205  NA 135  K 3.8  CL 101  CO2 24  BUN 16  CREATININE 1.11*  GLUCOSE 127*  CALCIUM 8.6*     Discharge Medications:   Allergies as of 08/05/2022       Reactions   Ciprofloxacin Hcl Hives   Percocet [oxycodone-acetaminophen] Other (See Comments)   RESPIRATORY DISTRESS   Penicillins Hives, Other (See Comments)   Has patient had a PCN reaction causing immediate rash, facial/tongue/throat swelling, SOB or lightheadedness with hypotension: no Has patient had a PCN reaction causing severe rash involving mucus membranes or skin necrosis: no Has patient had a PCN reaction that required hospitalization no Has patient had a PCN reaction occurring within the last 10 years: no If all of the above answers are "NO", then may proceed with Cephalosporin use.    Sulfa Antibiotics Hives   Tape Other (See Comments)   Adhesives - Causes Blisters. No band aids, and plastic tape    Tetanus Toxoids Hives, Rash   Jardiance [empagliflozin]    Severe UTI        Medication List     STOP taking these medications    aspirin EC 81 MG tablet Replaced by: aspirin 81 MG chewable tablet       TAKE these medications    aspirin 81 MG chewable tablet Chew 1 tablet (81 mg total) by mouth 2 (two) times daily. Replaces: aspirin EC 81 MG tablet   B-complex with vitamin C tablet Take 1 tablet by mouth daily.   chlorthalidone 25 MG tablet Commonly known as: HYGROTON TAKE ONE TABLET BY MOUTH ONCE DAILY   gabapentin 300 MG capsule Commonly known as: NEURONTIN Take 600 mg by mouth at bedtime.   HYDROcodone-acetaminophen 10-325 MG tablet Commonly known as: NORCO Take 1 tablet by mouth in the morning, at noon, and at bedtime.   HYDROmorphone 4 MG tablet Commonly known as: DILAUDID Take 1 tablet (4 mg total) by mouth every 6 (six) hours as needed for severe pain.   lidocaine 4 % cream Commonly known as: LMX Apply 1 application topically daily as needed (for joint pain).   lisinopril 40 MG tablet Commonly known as: ZESTRIL Take 40 mg by mouth daily.   methocarbamol 500 MG tablet Commonly known as: ROBAXIN Take 1 tablet (500 mg total) by mouth every 6 (six) hours as needed for muscle spasms. What changed: when to take this   OneTouch Delica Plus [id number] Misc   OneTouch Verio test strip Generic drug: glucose blood   pantoprazole 40 MG tablet Commonly known as: PROTONIX Take 40 mg by mouth 2 (two) times daily.   phenylephrine 0.25 % nasal spray Commonly known as: NEO-SYNEPHRINE Place 1 spray into both nostrils at bedtime as needed for congestion.   pioglitazone 30 MG tablet Commonly known as: ACTOS Take 30 mg by mouth daily.   polyethylene glycol powder 17 GM/SCOOP powder Commonly known as: GLYCOLAX/MIRALAX Take 17 g by mouth  daily for 30 doses.   rosuvastatin 20 MG tablet Commonly known as: CRESTOR TAKE 1 TABLET BY MOUTH EVERY DAY   Vitamin D (Ergocalciferol) 1.25 MG (50000 UNIT) Caps capsule Commonly known as: DRISDOL Take 1 capsule (50,000 Units total) by mouth every 7 (seven) days. What changed: when to take this   Vitamin D3 125 MCG (5000 UT) Caps Take 5,000 Units by mouth daily.               Durable Medical Equipment  (From  admission, onward)           Start     Ordered   08/03/22 1533  DME 3 n 1  Once        08/03/22 1533   08/03/22 1533  DME Walker rolling  Once       Question Answer Comment  Walker: With 5 Inch Wheels   Patient needs a walker to treat with the following condition Status post total right knee replacement      08/03/22 1533            Diagnostic Studies: DG Knee Right Port  Result Date: 08/03/2022 CLINICAL DATA:  Status post right knee replacement EXAM: PORTABLE RIGHT KNEE - 2 VIEW COMPARISON:  None Available. FINDINGS: Interval postsurgical changes from right total knee arthroplasty. Arthroplasty components appear in their expected alignment. No periprosthetic fracture is identified. Expected postoperative changes within the overlying soft tissues. IMPRESSION: Interval postsurgical changes from right total knee arthroplasty. Electronically Signed   By: Yetta Glassman M.D.   On: 08/03/2022 10:27    Disposition: Discharge disposition: 06-Home-Health Care Svc          Follow-up Information     Mcarthur Rossetti, MD Follow up in 2 week(s).   Specialty: Orthopedic Surgery Contact information: [address] [place] [address] [phone]                  Signed: Erskine Emery 08/05/2022, 10:22 AM

## 2022-08-05 NOTE — Progress Notes (Signed)
Pt's dressing to R knee was changed from compression wrap to Mepilex by provider earlier for pt to be able to take a shower, pt c/o itching around the dressing, reports likely due to adhesive, dressing changed back to compression wrap with ADB pad, site clean, dry, intact w/o redness, pt tolerated well.

## 2022-08-05 NOTE — Progress Notes (Signed)
Physical Therapy Treatment Patient Details Name: Lauren [name] MRN: [id number] DOB: [birth year] Today's Date: 08/05/2022   History of Present Illness Pt is 76 yo female s/p R TKA on 08/03/22.  Pt with hx including but not limited to L TKA 2019, R THA 2018, anxiety, arthritis, Breast CA, DM,    PT Comments    Patient continues to make progress towards physical therapy goals. Daughter present and supportive. Answered all questions from daughter to best of my ability. Patient able to demonstrate bed mobility, transfers, gait, and stairs for daughter to be prepared at discharge. D/c plan remains appropriate.     Recommendations for follow up therapy are one component of a multi-disciplinary discharge planning process, led by the attending physician.  Recommendations may be updated based on patient status, additional functional criteria and insurance authorization.  Follow Up Recommendations  Follow physician's recommendations for discharge plan and follow up therapies     Assistance Recommended at Discharge Frequent or constant Supervision/Assistance  Patient can return home with the following A lot of help with walking and/or transfers;A lot of help with bathing/dressing/bathroom;Assistance with cooking/housework;Help with stairs or ramp for entrance   Equipment Recommendations  None recommended by PT    Recommendations for Other Services       Precautions / Restrictions Precautions Precautions: Fall Restrictions Weight Bearing Restrictions: Yes RLE Weight Bearing: Weight bearing as tolerated     Mobility  Bed Mobility Overal bed mobility: Needs Assistance Bed Mobility: Supine to Sit     Supine to sit: Min assist     General bed mobility comments: attempted use of gait belt to assist R LE off bed. Assist required to complete R LE off bed    Transfers Overall transfer level: Needs assistance Equipment used: Rolling Lindsie Simar (2 wheels) Transfers: Sit to/from Stand Sit  to Stand: Min guard           General transfer comment: cues for hand placement and R LE management. Min guard to stand from various surfaces (bed, toilet, recliner)    Ambulation/Gait Ambulation/Gait assistance: Supervision, Min guard Gait Distance (Feet): 15 Feet (+15) Assistive device: Rolling Zoeann Mol (2 wheels) Gait Pattern/deviations: Step-to pattern, Decreased stride length, Decreased weight shift to right Gait velocity: decreased     General Gait Details: better recall of sequencing. Supervision for safety. min guard during slight incline going into bathroom   Stairs Stairs: Yes Stairs assistance: Min guard Stair Management: One rail Right, Step to pattern, Sideways Number of Stairs: 2 General stair comments: min guard for safety. Cues for counting to assist with movement to reduce fear of movement   Wheelchair Mobility    Modified Rankin (Stroke Patients Only)       Balance Overall balance assessment: Needs assistance Sitting-balance support: No upper extremity supported Sitting balance-[name]: Good     Standing balance support: Bilateral upper extremity supported, Reliant on assistive device for balance Standing balance-[name]: Poor                              Cognition Arousal/Alertness: Awake/alert Behavior During Therapy: WFL for tasks assessed/performed Overall Cognitive Status: Within Functional Limits for tasks assessed                                          Exercises      General Comments General  comments (skin integrity, edema, etc.): Daughter present and answered all questions for her      Pertinent Vitals/Pain Pain Assessment Pain Assessment: Faces Faces Pain Scale: Hurts little more Pain Location: R leg Pain Descriptors / Indicators: Sore Pain Intervention(s): Monitored during session    Home Living                          Prior Function            PT Goals (current goals  can now be found in the care plan section) Acute Rehab PT Goals Patient Stated Goal: return home PT Goal Formulation: With patient/family Time For Goal Achievement: 08/17/22 Potential to Achieve Goals: Good Progress towards PT goals: Progressing toward goals    Frequency    7X/week      PT Plan Current plan remains appropriate    Co-evaluation              AM-PAC PT "6 Clicks" Mobility   Outcome Measure  Help needed turning from your back to your side while in a flat bed without using bedrails?: A Little Help needed moving from lying on your back to sitting on the side of a flat bed without using bedrails?: A Little Help needed moving to and from a bed to a chair (including a wheelchair)?: A Little Help needed standing up from a chair using your arms (e.g., wheelchair or bedside chair)?: A Little Help needed to walk in hospital room?: A Little Help needed climbing 3-5 steps with a railing? : A Little 6 Click Score: 18    End of Session Equipment Utilized During Treatment: Gait belt Activity Tolerance: Patient tolerated treatment well Patient left: with call bell/phone within reach;in chair;with family/visitor present Nurse Communication: Mobility status PT Visit Diagnosis: Muscle weakness (generalized) (M62.81);Other abnormalities of gait and mobility (R26.89)     Time: 5638-9373 PT Time Calculation (min) (ACUTE ONLY): 47 min  Charges:  $Gait Training: 23-37 mins $Therapeutic Activity: 8-22 mins                     Mattison Golay A. Gilford Rile PT, DPT Acute Rehabilitation Services Office [phone]    Linna Hoff 08/05/2022, 4:56 PM

## 2022-08-05 NOTE — Plan of Care (Signed)
  Problem: Education: Goal: Knowledge of the prescribed therapeutic regimen will improve Outcome: Adequate for Discharge Goal: Individualized Educational Video(s) Outcome: Adequate for Discharge   Problem: Activity: Goal: Ability to avoid complications of mobility impairment will improve Outcome: Adequate for Discharge Goal: Range of joint motion will improve Outcome: Adequate for Discharge   Problem: Clinical Measurements: Goal: Postoperative complications will be avoided or minimized Outcome: Adequate for Discharge   Problem: Pain Management: Goal: Pain level will decrease with appropriate interventions Outcome: Adequate for Discharge   Problem: Skin Integrity: Goal: Will show signs of wound healing Outcome: Adequate for Discharge   Problem: Education: Goal: Knowledge of General Education information will improve Description: Including pain rating scale, medication(s)/side effects and non-pharmacologic comfort measures Outcome: Adequate for Discharge   Problem: Health Behavior/Discharge Planning: Goal: Ability to manage health-related needs will improve Outcome: Adequate for Discharge   Problem: Clinical Measurements: Goal: Ability to maintain clinical measurements within normal limits will improve Outcome: Adequate for Discharge Goal: Will remain free from infection Outcome: Adequate for Discharge Goal: Diagnostic test results will improve Outcome: Adequate for Discharge Goal: Respiratory complications will improve Outcome: Adequate for Discharge Goal: Cardiovascular complication will be avoided Outcome: Adequate for Discharge   Problem: Activity: Goal: Risk for activity intolerance will decrease Outcome: Adequate for Discharge   Problem: Nutrition: Goal: Adequate nutrition will be maintained Outcome: Adequate for Discharge   Problem: Coping: Goal: Level of anxiety will decrease Outcome: Adequate for Discharge   Problem: Elimination: Goal: Will not  experience complications related to bowel motility Outcome: Adequate for Discharge Goal: Will not experience complications related to urinary retention Outcome: Adequate for Discharge   Problem: Pain Managment: Goal: General experience of comfort will improve Outcome: Adequate for Discharge   Problem: Safety: Goal: Ability to remain free from injury will improve Outcome: Adequate for Discharge   Problem: Skin Integrity: Goal: Risk for impaired skin integrity will decrease Outcome: Adequate for Discharge   Problem: Acute Rehab PT Goals(only PT should resolve) Goal: Pt Will Go Supine/Side To Sit Outcome: Adequate for Discharge Goal: Pt Will Go Sit To Supine/Side Outcome: Adequate for Discharge Goal: Patient Will Transfer Sit To/From Stand Outcome: Adequate for Discharge Goal: Pt Will Transfer Bed To Chair/Chair To Bed Outcome: Adequate for Discharge Goal: Pt Will Ambulate Outcome: Adequate for Discharge Goal: Pt Will Go Up/Down Stairs Outcome: Adequate for Discharge   Problem: Acute Rehab PT Goals(only PT should resolve) Goal: Pt Will Go Supine/Side To Sit Outcome: Adequate for Discharge Goal: Pt Will Go Sit To Supine/Side Outcome: Adequate for Discharge Goal: Patient Will Transfer Sit To/From Stand Outcome: Adequate for Discharge Goal: Pt Will Transfer Bed To Chair/Chair To Bed Outcome: Adequate for Discharge Goal: Pt Will Ambulate Outcome: Adequate for Discharge Goal: Pt Will Go Up/Down Stairs Outcome: Adequate for Discharge

## 2022-08-05 NOTE — Progress Notes (Signed)
Physical Therapy Treatment Patient Details Name: Lauren [name] MRN: [id number] DOB: 21-Mar-1946 Today's Date: 08/05/2022   History of Present Illness Pt is 76 yo female s/p R TKA on 08/03/22.  Pt with hx including but not limited to L TKA 2019, R THA 2018, anxiety, arthritis, Breast CA, DM,    PT Comments    Continues to progress towards physical therapy goals. Focus on session was transfers, gait, and stair training. Requires min guard for sit to stand and ambulation with use of RW. Cues required for sequencing and hand placement throughout. Able to negotiate 2 stairs with minA and R hand rail sideways but requires max encouragement due to fear of bearing weight through R LE 2/2 pain. Will follow up for PM session prior to discharge home.     Recommendations for follow up therapy are one component of a multi-disciplinary discharge planning process, led by the attending physician.  Recommendations may be updated based on patient status, additional functional criteria and insurance authorization.  Follow Up Recommendations  Follow physician's recommendations for discharge plan and follow up therapies     Assistance Recommended at Discharge Frequent or constant Supervision/Assistance  Patient can return home with the following A lot of help with walking and/or transfers;A lot of help with bathing/dressing/bathroom;Assistance with cooking/housework;Help with stairs or ramp for entrance   Equipment Recommendations  None recommended by PT    Recommendations for Other Services       Precautions / Restrictions Precautions Precautions: Fall Required Braces or Orthoses: Knee Immobilizer - Right Knee Immobilizer - Right: Discontinue once straight leg raise with < 10 degree lag Restrictions Weight Bearing Restrictions: Yes RLE Weight Bearing: Weight bearing as tolerated     Mobility  Bed Mobility Overal bed mobility: Needs Assistance Bed Mobility: Supine to Sit     Supine to sit:  Min assist     General bed mobility comments: assist for R LE management    Transfers Overall transfer level: Needs assistance Equipment used: Rolling [name] (2 wheels) Transfers: Sit to/from Stand Sit to Stand: Min guard           General transfer comment: cues for hand placement and R LE management. Min guard to stand from various surfaces (bed, toilet, recliner)    Ambulation/Gait Ambulation/Gait assistance: Min guard Gait Distance (Feet): 10 Feet (+15) Assistive device: Rolling Lauren [name] (2 wheels) Gait Pattern/deviations: Step-to pattern, Decreased stride length, Decreased weight shift to right Gait velocity: decreased     General Gait Details: cues for sequencing with leading with R LE and heel strike. Min guard for safety. No LOB noted   Stairs Stairs: Yes Stairs assistance: Min assist Stair Management: One rail Right, Step to pattern, Sideways Number of Stairs: 2 General stair comments: assist for balance. Cues for sequencing and use of rail to assist. Fearful of bearing weight on R LE due to pain   Wheelchair Mobility    Modified Rankin (Stroke Patients Only)       Balance Overall balance assessment: Needs assistance Sitting-balance support: No upper extremity supported Sitting balance-[name]: Good     Standing balance support: Bilateral upper extremity supported, Reliant on assistive device for balance Standing balance-[name]: Poor                              Cognition Arousal/Alertness: Awake/alert Behavior During Therapy: WFL for tasks assessed/performed Overall Cognitive Status: Within Functional Limits for tasks assessed  Exercises      General Comments        Pertinent Vitals/Pain Pain Assessment Pain Assessment: Faces Faces Pain Scale: Hurts even more Pain Location: R leg Pain Descriptors / Indicators: Sore Pain Intervention(s): Monitored during  session    Home Living                          Prior Function            PT Goals (current goals can now be found in the care plan section) Acute Rehab PT Goals Patient Stated Goal: return home PT Goal Formulation: With patient/family Time For Goal Achievement: 08/17/22 Potential to Achieve Goals: Good Progress towards PT goals: Progressing toward goals    Frequency    7X/week      PT Plan Current plan remains appropriate    Co-evaluation              AM-PAC PT "6 Clicks" Mobility   Outcome Measure  Help needed turning from your back to your side while in a flat bed without using bedrails?: A Little Help needed moving from lying on your back to sitting on the side of a flat bed without using bedrails?: A Little Help needed moving to and from a bed to a chair (including a wheelchair)?: A Little Help needed standing up from a chair using your arms (e.g., wheelchair or bedside chair)?: A Little Help needed to walk in hospital room?: A Little Help needed climbing 3-5 steps with a railing? : A Little 6 Click Score: 18    End of Session Equipment Utilized During Treatment: Gait belt Activity Tolerance: Patient limited by pain Patient left: with call bell/phone within reach;in chair Nurse Communication: Mobility status PT Visit Diagnosis: Muscle weakness (generalized) (M62.81);Other abnormalities of gait and mobility (R26.89)     Time: 6599-3570 PT Time Calculation (min) (ACUTE ONLY): 55 min  Charges:  $Gait Training: 23-37 mins $Therapeutic Activity: 23-37 mins                     [name] A. Gilford Rile PT, DPT Acute Rehabilitation Services Office [phone]    Linna Hoff 08/05/2022, 9:59 AM

## 2022-08-05 NOTE — TOC Transition Note (Signed)
Transition of Care [facility]) - CM/SW Discharge Note   Patient Details  Name: Lauren [name] MRN: [id number] Date of Birth: 1946/06/22  Transition of Care [facility] CM/SW Contact:  Sharin Mons, RN Phone Number: 08/05/2022, 11:07 AM   Clinical Narrative:    Patient will DC [id number] Anticipated DC date: 08/05/2022 Family notified: yes Transport by:  car         - R TKA,10/5 Per MD patient ready for DC today.RN, patient, patient's family, and [place] aware of DC. Pt states daughter and son to assist with care once d/c.  Pt without DME needs. Already has RW and BSC @ home. PTA provider's office arranged home health services with [facility]. Pt without Rx MED concerns. Post hospital f/u noted on AVS. Family to provide transportation to home .  RNCM will sign off for now as intervention is no longer needed. Please consult [place] again if new needs arise.    Final next level of care: Home w Home Health Services Barriers to Discharge: No Barriers Identified   Patient Goals and CMS Choice        Discharge Placement                       Discharge Plan and Services                          HH Arranged: PT HH Agency: [place] Date [facility] Agency Contacted: 08/05/22 Time HH Agency Contacted: [phone]    Social Determinants of Health (SDOH) Interventions     Readmission Risk Interventions     No data to display

## 2022-08-05 NOTE — Plan of Care (Signed)
  Problem: Education: Goal: Knowledge of the prescribed therapeutic regimen will improve Outcome: Progressing   Problem: Activity: Goal: Ability to avoid complications of mobility impairment will improve Outcome: Progressing Goal: Range of joint motion will improve Outcome: Progressing   Problem: Pain Management: Goal: Pain level will decrease with appropriate interventions Outcome: Progressing   Problem: Skin Integrity: Goal: Will show signs of wound healing Outcome: Progressing   Problem: Activity: Goal: Risk for activity intolerance will decrease Outcome: Progressing   Problem: Elimination: Goal: Will not experience complications related to bowel motility Outcome: Progressing Goal: Will not experience complications related to urinary retention Outcome: Progressing   Problem: Pain Managment: Goal: General experience of comfort will improve Outcome: Progressing   Problem: Safety: Goal: Ability to remain free from injury will improve Outcome: Progressing   Problem: Skin Integrity: Goal: Risk for impaired skin integrity will decrease Outcome: Progressing

## 2022-08-05 NOTE — Progress Notes (Signed)
Nsg Discharge Note  Admit Date:  08/03/2022 Discharge date: 08/05/2022   Lauren [name] to be D/C'd Home w/ [name] per MD order.  AVS completed. Patient/caregiver able to verbalize understanding.  Discharge Medication: Allergies as of 08/05/2022       Reactions   Ciprofloxacin Hcl Hives   Percocet [oxycodone-acetaminophen] Other (See Comments)   RESPIRATORY DISTRESS   Penicillins Hives, Other (See Comments)   Has patient had a PCN reaction causing immediate rash, facial/tongue/throat swelling, SOB or lightheadedness with hypotension: no Has patient had a PCN reaction causing severe rash involving mucus membranes or skin necrosis: no Has patient had a PCN reaction that required hospitalization no Has patient had a PCN reaction occurring within the last 10 years: no If all of the above answers are "NO", then may proceed with Cephalosporin use.   Sulfa Antibiotics Hives   Tape Other (See Comments)   Adhesives - Causes Blisters. No band aids, and plastic tape    Tetanus Toxoids Hives, Rash   Jardiance [empagliflozin]    Severe UTI        Medication List     STOP taking these medications    aspirin EC 81 MG tablet Replaced by: aspirin 81 MG chewable tablet       TAKE these medications    aspirin 81 MG chewable tablet Chew 1 tablet (81 mg total) by mouth 2 (two) times daily. Replaces: aspirin EC 81 MG tablet   B-complex with vitamin C tablet Take 1 tablet by mouth daily.   chlorthalidone 25 MG tablet Commonly known as: HYGROTON TAKE ONE TABLET BY MOUTH ONCE DAILY   gabapentin 300 MG capsule Commonly known as: NEURONTIN Take 600 mg by mouth at bedtime.   HYDROcodone-acetaminophen 10-325 MG tablet Commonly known as: NORCO Take 1 tablet by mouth in the morning, at noon, and at bedtime.   HYDROmorphone 4 MG tablet Commonly known as: DILAUDID Take 1 tablet (4 mg total) by mouth every 6 (six) hours as needed for severe pain.   lidocaine 4 % cream Commonly known as:  LMX Apply 1 application topically daily as needed (for joint pain).   lisinopril 40 MG tablet Commonly known as: ZESTRIL Take 40 mg by mouth daily.   methocarbamol 500 MG tablet Commonly known as: ROBAXIN Take 1 tablet (500 mg total) by mouth every 6 (six) hours as needed for muscle spasms. What changed: when to take this   OneTouch Delica Plus [id number] Misc   OneTouch Verio test strip Generic drug: glucose blood   pantoprazole 40 MG tablet Commonly known as: PROTONIX Take 40 mg by mouth 2 (two) times daily.   phenylephrine 0.25 % nasal spray Commonly known as: NEO-SYNEPHRINE Place 1 spray into both nostrils at bedtime as needed for congestion.   pioglitazone 30 MG tablet Commonly known as: ACTOS Take 30 mg by mouth daily.   polyethylene glycol powder 17 GM/SCOOP powder Commonly known as: GLYCOLAX/MIRALAX Take 17 g by mouth daily for 30 doses.   rosuvastatin 20 MG tablet Commonly known as: CRESTOR TAKE 1 TABLET BY MOUTH EVERY DAY   Vitamin D (Ergocalciferol) 1.25 MG (50000 UNIT) Caps capsule Commonly known as: DRISDOL Take 1 capsule (50,000 Units total) by mouth every 7 (seven) days. What changed: when to take this   Vitamin D3 125 MCG (5000 UT) Caps Take 5,000 Units by mouth daily.               Durable Medical Equipment  (From admission, onward)  Start     Ordered   08/03/22 1533  DME 3 n 1  Once        08/03/22 1533   08/03/22 1533  DME Walker rolling  Once       Question Answer Comment  Walker: With 5 Inch Wheels   Patient needs a walker to treat with the following condition Status post total right knee replacement      08/03/22 1533            Discharge Assessment: Vitals:   08/04/22 2153 08/05/22 0746  BP: (!) 108/49 121/61  Pulse: 80 75  Resp:  17  Temp: 98.3 F (36.8 C) 98.3 F (36.8 C)  SpO2: 98% 99%   Skin clean, dry and intact without evidence of skin break down, no evidence of skin tears noted. IV catheter  discontinued intact. Site without signs and symptoms of complications - no redness or edema noted at insertion site, patient denies c/o pain - only slight tenderness at site.  Dressing with slight pressure applied.  D/c Instructions-Education: Discharge instructions given to patient/family with verbalized understanding. D/c education completed with patient/family including follow up instructions, medication list, d/c activities limitations if indicated, with other d/c instructions as indicated by MD - patient able to verbalize understanding, all questions fully answered. Patient instructed to return to ED, call 911, or call MD for any changes in condition.  Patient escorted via [place], and D/C home via private auto.  Lauren Ina, RN 08/05/2022 3:50 PM

## 2022-08-06 ENCOUNTER — Telehealth: Payer: Self-pay | Admitting: *Deleted

## 2022-08-06 DIAGNOSIS — Z471 Aftercare following joint replacement surgery: Secondary | ICD-10-CM | POA: Diagnosis not present

## 2022-08-06 NOTE — Care Plan (Signed)
Discharge call to patient today and discussed with her and her daughter how she is doing overall since home after Right total knee replacement done on 08/03/22. She reports she is doing well. Did have some breakthrough pain last night in which she took the prescribed Dilaudid. She is continuing with the Norco tid as prescribed by Dr. Hardin Negus (Pain management). RNCM did leave message for Dr. Hardin Negus' office to inform of new rx of Dilaudid per Dr. [name] in the immediate post op period. Patient to see HHPT today for start of therapy. Scheduled OPPT to begin around 2 weeks with [facility] at her request. OPPT to start 08/17/22 at 12:00. She is aware.

## 2022-08-06 NOTE — Telephone Encounter (Signed)
Ortho bundle D/C call completed. 

## 2022-08-12 DIAGNOSIS — Z79899 Other long term (current) drug therapy: Secondary | ICD-10-CM | POA: Diagnosis not present

## 2022-08-12 DIAGNOSIS — G894 Chronic pain syndrome: Secondary | ICD-10-CM | POA: Diagnosis not present

## 2022-08-12 DIAGNOSIS — M150 Primary generalized (osteo)arthritis: Secondary | ICD-10-CM | POA: Diagnosis not present

## 2022-08-12 DIAGNOSIS — M47817 Spondylosis without myelopathy or radiculopathy, lumbosacral region: Secondary | ICD-10-CM | POA: Diagnosis not present

## 2022-08-13 ENCOUNTER — Telehealth: Payer: Self-pay | Admitting: *Deleted

## 2022-08-13 NOTE — Telephone Encounter (Signed)
Ortho bundle call; patient doing ok overall, but very emotional today. Saw Dr. Hardin Negus her pain management MD yesterday. He thought the [place] she has been on for quite some time was no longer working. Left her on Dilaudid only and refilled this for her. Just FYI. Sees [facility] on Monday, 08/16/22.

## 2022-08-14 ENCOUNTER — Other Ambulatory Visit: Payer: Self-pay | Admitting: Physician Assistant

## 2022-08-16 ENCOUNTER — Encounter: Payer: Self-pay | Admitting: Orthopaedic Surgery

## 2022-08-16 ENCOUNTER — Ambulatory Visit (INDEPENDENT_AMBULATORY_CARE_PROVIDER_SITE_OTHER): Payer: HMO | Admitting: Orthopaedic Surgery

## 2022-08-16 ENCOUNTER — Telehealth: Payer: Self-pay | Admitting: *Deleted

## 2022-08-16 DIAGNOSIS — Z96651 Presence of right artificial knee joint: Secondary | ICD-10-CM

## 2022-08-16 NOTE — Telephone Encounter (Signed)
Ortho bundle 14 day in office meeting completed. °

## 2022-08-16 NOTE — Progress Notes (Signed)
The patient is here for first postoperative visit status post a right total knee arthroplasty.  She is doing well.  She has had a little bit of low blood pressure so she is holding her blood pressure medications and work on hydration.  She is talked to her primary care physician as well as her pain management specialist.  She is ambulate with a walker.  She is scheduled to start physical therapy with [facility] tomorrow.  She has had home health therapy in the interim.  She has been compliant with a baby aspirin twice a day.  She was on this once a day prior to surgery and can go back to it once a day.  On exam her calf is soft on the right side.  Her staples been removed and Steri-Strips applied.  She has almost full extension of the right knee and flexion to almost 90 degrees.  Most of her pain that was at her IT band the proximal hip on the right side.  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  She will transition outpatient physical therapy tomorrow and I am fine with him working on any modalities for her right hip as well.  We will see her back in 4 weeks to see how she is doing from a mobility and range of motion standpoint but no x-rays are needed.

## 2022-08-25 ENCOUNTER — Ambulatory Visit (HOSPITAL_BASED_OUTPATIENT_CLINIC_OR_DEPARTMENT_OTHER): Payer: PPO | Admitting: Cardiovascular Disease

## 2022-09-10 ENCOUNTER — Telehealth: Payer: Self-pay | Admitting: *Deleted

## 2022-09-10 NOTE — Telephone Encounter (Signed)
Ortho bundle 30 day call completed. °

## 2022-09-13 ENCOUNTER — Encounter: Payer: HMO | Admitting: Orthopaedic Surgery

## 2022-09-15 ENCOUNTER — Ambulatory Visit (INDEPENDENT_AMBULATORY_CARE_PROVIDER_SITE_OTHER): Payer: HMO | Admitting: Orthopaedic Surgery

## 2022-09-15 ENCOUNTER — Encounter: Payer: Self-pay | Admitting: Orthopaedic Surgery

## 2022-09-15 DIAGNOSIS — M7061 Trochanteric bursitis, right hip: Secondary | ICD-10-CM

## 2022-09-15 DIAGNOSIS — Z96651 Presence of right artificial knee joint: Secondary | ICD-10-CM

## 2022-09-15 MED ORDER — LIDOCAINE HCL 1 % IJ SOLN
3.0000 mL | INTRAMUSCULAR | Status: AC | PRN
  Administered 2022-09-15: 3 mL

## 2022-09-15 MED ORDER — METHYLPREDNISOLONE ACETATE 40 MG/ML IJ SUSP
40.0000 mg | INTRAMUSCULAR | Status: AC | PRN
  Administered 2022-09-15: 40 mg via INTRA_ARTICULAR

## 2022-09-15 NOTE — Progress Notes (Signed)
       Procedure Note  Patient: Lauren [name]             Date of Birth: 1946-04-23           MRN: [id number]             Visit Date: 09/15/2022  HPI: Lauren [name] comes in today status post right total knee arthroplasty 08/03/2022.  States knee is overall doing well.  Ranks her pain in regards to need to be 4-5 out of 10 at worst.  Main complaint is right hip IT band pain which is 5 out of 10.  She is still taking Dilaudid 3 times a day.  She was on the hydrocodone for her back prior to surgery.  She is diabetic and reports her hemoglobin A1c to be 6.1.  Therapies tried dry needling and stretching for the IT band pain.  She continues therapy for her knee.  Review of systems: See HPI otherwise negative  Physical exam: General well-developed well-nourished female who ambulates without any assistive device.  Gets on and off the exam table easily.  Right knee: Surgical incisions healing well.  Full extension flexion to 110 degrees.  No instability valgus varus stressing.  Calf supple nontender dorsiflexion plantarflexion right ankle intact.  Right hip: Tenderness over the IT band and over the trochanteric region.  Otherwise good range of motion of the hip.   Procedures: Visit Diagnoses:  1. Status post total right knee replacement   2. Trochanteric bursitis, right hip     Large Joint Inj: R greater trochanter on 09/15/2022 9:30 AM Indications: pain Details: 22 G 1.5 in needle, lateral approach  Arthrogram: No  Medications: 3 mL lidocaine 1 %; 40 mg methylPREDNISolone acetate 40 MG/ML Outcome: tolerated well, no immediate complications Procedure, treatment alternatives, risks and benefits explained, specific risks discussed. Consent was given by the patient. Immediately prior to procedure a time out was called to verify the correct patient, procedure, equipment, support staff and site/side marked as required. Patient was prepped and draped in the usual sterile fashion.      Plan: She shown IT band stretching exercises.  She will follow-up with [place] in 4 weeks sooner if there is any questions concerns.  Questions were encouraged and answered.

## 2022-09-20 ENCOUNTER — Ambulatory Visit (INDEPENDENT_AMBULATORY_CARE_PROVIDER_SITE_OTHER): Payer: HMO | Admitting: Family Medicine

## 2022-09-20 ENCOUNTER — Encounter (INDEPENDENT_AMBULATORY_CARE_PROVIDER_SITE_OTHER): Payer: Self-pay

## 2022-09-20 ENCOUNTER — Encounter (INDEPENDENT_AMBULATORY_CARE_PROVIDER_SITE_OTHER): Payer: Self-pay | Admitting: Family Medicine

## 2022-10-12 ENCOUNTER — Encounter (INDEPENDENT_AMBULATORY_CARE_PROVIDER_SITE_OTHER): Payer: Self-pay | Admitting: Family Medicine

## 2022-10-12 ENCOUNTER — Other Ambulatory Visit: Payer: HMO | Admitting: Surgical

## 2022-10-12 ENCOUNTER — Ambulatory Visit (INDEPENDENT_AMBULATORY_CARE_PROVIDER_SITE_OTHER): Payer: HMO | Admitting: Family Medicine

## 2022-10-12 VITALS — BP 131/79 | HR 67 | Temp 97.7°F | Ht 64.0 in | Wt 196.0 lb

## 2022-10-12 DIAGNOSIS — Z6833 Body mass index (BMI) 33.0-33.9, adult: Secondary | ICD-10-CM | POA: Diagnosis not present

## 2022-10-12 DIAGNOSIS — E669 Obesity, unspecified: Secondary | ICD-10-CM

## 2022-10-12 DIAGNOSIS — E1169 Type 2 diabetes mellitus with other specified complication: Secondary | ICD-10-CM

## 2022-10-13 ENCOUNTER — Ambulatory Visit: Payer: HMO | Admitting: Orthopaedic Surgery

## 2022-10-27 ENCOUNTER — Other Ambulatory Visit: Payer: Self-pay | Admitting: Family Medicine

## 2022-10-27 DIAGNOSIS — E2839 Other primary ovarian failure: Secondary | ICD-10-CM

## 2022-10-27 NOTE — Progress Notes (Signed)
Chief Complaint:   OBESITY Lauren [name] is here to discuss her progress with her obesity treatment plan along with follow-up of her obesity related diagnoses. Lauren [name] is on practicing portion control and making smarter food choices, such as increasing vegetables and decreasing simple carbohydrates and states she is following her eating plan approximately 0% of the time. Lauren [name] states she is doing physical therapy 2 times per week.    Today's visit was #: 20 Starting weight: 219 lbs Starting date: 04/22/2021 Today's weight: 196 lbs Today's date: 10/12/2022 Total lbs lost to date: 23 Total lbs lost since last in-office visit: 0  Interim History: Apryll has had knee surgery and she is working on getting back on track with her eating. She is open to discussing holiday eating strategies.   Subjective:   1. Type 2 diabetes mellitus with other specified complication, without long-term current use of insulin (HCC) Lauren [name]'s A1c has increased from 6.1 on 09/20/2022 to 6.6. She is working on getting back on track with her eating plan.   Assessment/Plan:   1. Type 2 diabetes mellitus with other specified complication, without long-term current use of insulin (HCC) Lauren [name] was given ideas of how to avoid simple carbohydrates over the holidays. She will continue to monitor her diet as this is the most important way to control her glucose.   2. Obesity, Current BMI 33.7 Lauren [name] is currently in the action stage of change. As such, her goal is to continue with weight loss efforts. She has agreed to the Category 2 Plan.   Exercise goals: As is.   Behavioral modification strategies: increasing lean protein intake, meal planning and cooking strategies, and holiday eating strategies .  Lauren [name] has agreed to follow-up with our clinic in 4 weeks. She was informed of the importance of frequent follow-up visits to maximize her success with intensive lifestyle modifications for her multiple health  conditions.   Objective:   Blood pressure 131/79, pulse 67, temperature 97.7 F (36.5 C), height '5\' 4"'$  (1.626 m), weight 196 lb (88.9 kg), SpO2 99 %. Body mass index is 33.64 kg/m.  General: Cooperative, alert, well developed, in no acute distress. HEENT: Conjunctivae and lids unremarkable. Cardiovascular: Regular rhythm.  Lungs: Normal work of breathing. Neurologic: No focal deficits.   Lab Results  Component Value Date   CREATININE 1.11 (H) 08/04/2022   BUN 16 08/04/2022   NA 135 08/04/2022   K 3.8 08/04/2022   CL 101 08/04/2022   CO2 24 08/04/2022   Lab Results  Component Value Date   ALT 12 06/08/2022   AST 16 06/08/2022   ALKPHOS 50 06/08/2022   BILITOT 0.4 06/08/2022   Lab Results  Component Value Date   HGBA1C 6.1 (H) 07/23/2022   HGBA1C 6.1 (H) 05/11/2022   HGBA1C 5.9 (H) 12/10/2021   HGBA1C 6.7 (H) 04/22/2021   HGBA1C 8.9 (H) 11/13/2018   Lab Results  Component Value Date   INSULIN 11.1 05/11/2022   INSULIN 13.3 12/10/2021   INSULIN 22.3 04/22/2021   Lab Results  Component Value Date   TSH 2.150 05/11/2022   Lab Results  Component Value Date   CHOL 128 05/11/2022   HDL 56 05/11/2022   LDLCALC 56 05/11/2022   TRIG 83 05/11/2022   Lab Results  Component Value Date   VD25OH 69.3 05/11/2022   VD25OH 86.9 12/10/2021   VD25OH 40.7 04/22/2021   Lab Results  Component Value Date   WBC 7.8 08/04/2022   HGB 9.5 (L) 08/04/2022  HCT 27.9 (L) 08/04/2022   MCV 88.9 08/04/2022   PLT 205 08/04/2022   No results found for: "IRON", "TIBC", "FERRITIN"  Attestation Statements:   Reviewed by clinician on day of visit: allergies, medications, problem list, medical history, surgical history, family history, social history, and previous encounter notes.  Time spent on visit including pre-visit chart review and post-visit care and charting was 42 minutes.   I, Trixie [name], am acting as transcriptionist for Dennard Nip, MD.  I have reviewed the  above documentation for accuracy and completeness, and I agree with the above. -  Dennard Nip, MD

## 2022-11-02 DIAGNOSIS — M25461 Effusion, right knee: Secondary | ICD-10-CM | POA: Diagnosis not present

## 2022-11-02 DIAGNOSIS — M25661 Stiffness of right knee, not elsewhere classified: Secondary | ICD-10-CM | POA: Diagnosis not present

## 2022-11-02 DIAGNOSIS — M6281 Muscle weakness (generalized): Secondary | ICD-10-CM | POA: Diagnosis not present

## 2022-11-02 DIAGNOSIS — M25561 Pain in right knee: Secondary | ICD-10-CM | POA: Diagnosis not present

## 2022-11-04 DIAGNOSIS — M25561 Pain in right knee: Secondary | ICD-10-CM | POA: Diagnosis not present

## 2022-11-04 DIAGNOSIS — M25661 Stiffness of right knee, not elsewhere classified: Secondary | ICD-10-CM | POA: Diagnosis not present

## 2022-11-04 DIAGNOSIS — M6281 Muscle weakness (generalized): Secondary | ICD-10-CM | POA: Diagnosis not present

## 2022-11-04 DIAGNOSIS — M25461 Effusion, right knee: Secondary | ICD-10-CM | POA: Diagnosis not present

## 2022-11-09 ENCOUNTER — Telehealth: Payer: Self-pay | Admitting: *Deleted

## 2022-11-09 ENCOUNTER — Ambulatory Visit (INDEPENDENT_AMBULATORY_CARE_PROVIDER_SITE_OTHER): Payer: HMO | Admitting: Orthopaedic Surgery

## 2022-11-09 ENCOUNTER — Encounter: Payer: Self-pay | Admitting: Orthopaedic Surgery

## 2022-11-09 DIAGNOSIS — Z96651 Presence of right artificial knee joint: Secondary | ICD-10-CM

## 2022-11-09 NOTE — Progress Notes (Signed)
HPI: Lauren [name] returns today status post right total knee arthroplasty 08/31/2022.  She is overall doing well.  She does state that she still having some problems with the hip bursitis on the right but the injection helped and therapy has helped.  In regards to her knee she states she is doing well.  Review of systems: See HPI otherwise negative or noncontributory.  Physical exam: General well-developed well-nourished female no acute distress mood affect appropriate.  Ambulates with a nonantalgic gait no assistive device.  Right knee full extension flexion to 115 degrees no instability valgus varus stressing.  Calf supple nontender.  Dorsiflexion plantarflexion right ankle intact.  Surgical incisions well-healed.  Impression: Status post right total hip arthroplasty 08/31/2022  Plan: She will continue to work on range of motion of the knee.  Also continue to work on stretching of her IT band.  She will follow-up with [place] in 6 months sooner if there is any questions concerns.  At return we will obtain an AP and lateral view of her right knee.  Questions were encouraged and answered

## 2022-11-09 NOTE — Telephone Encounter (Signed)
Ortho bundle 90 day call completed. 

## 2022-11-10 DIAGNOSIS — M6281 Muscle weakness (generalized): Secondary | ICD-10-CM | POA: Diagnosis not present

## 2022-11-10 DIAGNOSIS — M25561 Pain in right knee: Secondary | ICD-10-CM | POA: Diagnosis not present

## 2022-11-10 DIAGNOSIS — M25661 Stiffness of right knee, not elsewhere classified: Secondary | ICD-10-CM | POA: Diagnosis not present

## 2022-11-10 DIAGNOSIS — M25461 Effusion, right knee: Secondary | ICD-10-CM | POA: Diagnosis not present

## 2022-11-16 DIAGNOSIS — M25461 Effusion, right knee: Secondary | ICD-10-CM | POA: Diagnosis not present

## 2022-11-16 DIAGNOSIS — M25661 Stiffness of right knee, not elsewhere classified: Secondary | ICD-10-CM | POA: Diagnosis not present

## 2022-11-16 DIAGNOSIS — M6281 Muscle weakness (generalized): Secondary | ICD-10-CM | POA: Diagnosis not present

## 2022-11-16 DIAGNOSIS — M25561 Pain in right knee: Secondary | ICD-10-CM | POA: Diagnosis not present

## 2022-11-18 DIAGNOSIS — M25461 Effusion, right knee: Secondary | ICD-10-CM | POA: Diagnosis not present

## 2022-11-18 DIAGNOSIS — M6281 Muscle weakness (generalized): Secondary | ICD-10-CM | POA: Diagnosis not present

## 2022-11-18 DIAGNOSIS — M25661 Stiffness of right knee, not elsewhere classified: Secondary | ICD-10-CM | POA: Diagnosis not present

## 2022-11-18 DIAGNOSIS — M25561 Pain in right knee: Secondary | ICD-10-CM | POA: Diagnosis not present

## 2022-11-22 DIAGNOSIS — M25461 Effusion, right knee: Secondary | ICD-10-CM | POA: Diagnosis not present

## 2022-11-22 DIAGNOSIS — M6281 Muscle weakness (generalized): Secondary | ICD-10-CM | POA: Diagnosis not present

## 2022-11-22 DIAGNOSIS — M25661 Stiffness of right knee, not elsewhere classified: Secondary | ICD-10-CM | POA: Diagnosis not present

## 2022-11-22 DIAGNOSIS — M25561 Pain in right knee: Secondary | ICD-10-CM | POA: Diagnosis not present

## 2022-11-23 ENCOUNTER — Ambulatory Visit (INDEPENDENT_AMBULATORY_CARE_PROVIDER_SITE_OTHER): Payer: HMO | Admitting: Family Medicine

## 2022-11-25 DIAGNOSIS — M25561 Pain in right knee: Secondary | ICD-10-CM | POA: Diagnosis not present

## 2022-11-25 DIAGNOSIS — M25661 Stiffness of right knee, not elsewhere classified: Secondary | ICD-10-CM | POA: Diagnosis not present

## 2022-11-25 DIAGNOSIS — M25461 Effusion, right knee: Secondary | ICD-10-CM | POA: Diagnosis not present

## 2022-11-25 DIAGNOSIS — M6281 Muscle weakness (generalized): Secondary | ICD-10-CM | POA: Diagnosis not present

## 2022-11-29 DIAGNOSIS — M25561 Pain in right knee: Secondary | ICD-10-CM | POA: Diagnosis not present

## 2022-11-29 DIAGNOSIS — M25461 Effusion, right knee: Secondary | ICD-10-CM | POA: Diagnosis not present

## 2022-11-29 DIAGNOSIS — M6281 Muscle weakness (generalized): Secondary | ICD-10-CM | POA: Diagnosis not present

## 2022-11-29 DIAGNOSIS — M25661 Stiffness of right knee, not elsewhere classified: Secondary | ICD-10-CM | POA: Diagnosis not present

## 2022-12-01 ENCOUNTER — Ambulatory Visit (INDEPENDENT_AMBULATORY_CARE_PROVIDER_SITE_OTHER): Payer: PPO | Admitting: Family Medicine

## 2022-12-01 ENCOUNTER — Encounter (INDEPENDENT_AMBULATORY_CARE_PROVIDER_SITE_OTHER): Payer: Self-pay | Admitting: Family Medicine

## 2022-12-01 VITALS — BP 138/72 | HR 79 | Temp 97.3°F | Ht 64.0 in | Wt 200.0 lb

## 2022-12-01 DIAGNOSIS — Z6833 Body mass index (BMI) 33.0-33.9, adult: Secondary | ICD-10-CM | POA: Insufficient documentation

## 2022-12-01 DIAGNOSIS — E669 Obesity, unspecified: Secondary | ICD-10-CM | POA: Diagnosis not present

## 2022-12-01 DIAGNOSIS — M25561 Pain in right knee: Secondary | ICD-10-CM | POA: Diagnosis not present

## 2022-12-01 DIAGNOSIS — F338 Other recurrent depressive disorders: Secondary | ICD-10-CM

## 2022-12-01 DIAGNOSIS — E559 Vitamin D deficiency, unspecified: Secondary | ICD-10-CM

## 2022-12-01 DIAGNOSIS — M25461 Effusion, right knee: Secondary | ICD-10-CM | POA: Diagnosis not present

## 2022-12-01 DIAGNOSIS — Z6834 Body mass index (BMI) 34.0-34.9, adult: Secondary | ICD-10-CM

## 2022-12-01 DIAGNOSIS — Z6832 Body mass index (BMI) 32.0-32.9, adult: Secondary | ICD-10-CM | POA: Insufficient documentation

## 2022-12-01 DIAGNOSIS — M25661 Stiffness of right knee, not elsewhere classified: Secondary | ICD-10-CM | POA: Diagnosis not present

## 2022-12-01 DIAGNOSIS — M6281 Muscle weakness (generalized): Secondary | ICD-10-CM | POA: Diagnosis not present

## 2022-12-01 MED ORDER — ESCITALOPRAM OXALATE 10 MG PO TABS: 10.0000 mg | ORAL_TABLET | Freq: Every day | ORAL | 0 refills | Status: DC

## 2022-12-02 ENCOUNTER — Ambulatory Visit (INDEPENDENT_AMBULATORY_CARE_PROVIDER_SITE_OTHER): Payer: Self-pay | Admitting: Surgical

## 2022-12-02 DIAGNOSIS — Z411 Encounter for cosmetic surgery: Secondary | ICD-10-CM

## 2022-12-02 LAB — VITAMIN D 25 HYDROXY (VIT D DEFICIENCY, FRACTURES): Vit D, 25-Hydroxy: 55.8 ng/mL (ref 30.0–100.0)

## 2022-12-02 NOTE — Progress Notes (Signed)
Preoperative Dx: Hyperpigmentation/sun damage to face and facial vessels  Postoperative Dx:  same  Procedure: laser to face  Anesthesia: none  Description of Procedure:  Risks and complications were explained to the patient. Consent was confirmed and signed. Time out was called and all information was confirmed to be correct. The area  area was prepped with alcohol and wiped dry.   The BBL laser was turned to the telangiectasias settings, 20 J/cm, 30 ms and 20 C, the cheeks and nose vascular telangiectasias were lasered using the 3 mm surgical handpiece.  Attention was then turned to the rosacea of her nose and bilateral cheeks, the 560 nm laser was set at 7.5 J/cm, the face was lasered for total of 20 passes of bilateral cheeks and nose.  The 515 nm laser was then set at 7 J/cm, the face was lasered for total of 220 passes of bilateral cheeks, perioral area, forehead and nose.  The patient overall tolerated the procedure well, she does have some increased redness over the left anterior cheek where she had a cluster of telangiectasias present.  She had a very small pinpoint blister that was noted.  Recommend continue to watch this area, patient was understanding and in agreement with this.  Laser balm was applied to patient's face, recommend wearing sunscreen consistently for overall skin health and to avoid additional pigmentation.  We discussed that the lighter pigment of her face may not be able to be treated with the BBL due to the depth of the pigment.  Pictures were obtained of the patient and placed in the chart with the patient's or guardian's permission.

## 2022-12-07 ENCOUNTER — Encounter (HOSPITAL_BASED_OUTPATIENT_CLINIC_OR_DEPARTMENT_OTHER): Payer: Self-pay | Admitting: Cardiovascular Disease

## 2022-12-07 ENCOUNTER — Ambulatory Visit (INDEPENDENT_AMBULATORY_CARE_PROVIDER_SITE_OTHER): Payer: PPO | Admitting: Cardiovascular Disease

## 2022-12-07 VITALS — BP 130/62 | HR 65 | Ht 64.0 in | Wt 205.0 lb

## 2022-12-07 DIAGNOSIS — I2584 Coronary atherosclerosis due to calcified coronary lesion: Secondary | ICD-10-CM

## 2022-12-07 DIAGNOSIS — I10 Essential (primary) hypertension: Secondary | ICD-10-CM | POA: Diagnosis not present

## 2022-12-07 DIAGNOSIS — I2510 Atherosclerotic heart disease of native coronary artery without angina pectoris: Secondary | ICD-10-CM

## 2022-12-07 DIAGNOSIS — E7800 Pure hypercholesterolemia, unspecified: Secondary | ICD-10-CM

## 2022-12-07 NOTE — Progress Notes (Signed)
Cardiology Office Note   Evaluation Performed:  Follow-up visit  Date:  12/07/2022   ID:  Lauren [name], DOB 25-Oct-1946, MRN [id number]  PCP:  Lawerance Cruel, MD  Cardiologist:  Skeet Latch, MD  Electrophysiologist:  None   Chief Complaint:  Follow up  History of Present Illness:    Lauren [name] is a 77 y.o. female with asymptomatic coronary calcification, diabetes, hypertension, hyperlipidemia, R breast cancer s/p surgery/XRT/chemo and family history of CAD who presents for follow up.    She was initially seen 09/2015 for an evaluation of chest pain.  She had been seen in the emergency department  where cardiac enzymes were negative.  D-dimer was elevated, so she underwent CT-A of the chest that was negative for PE. She had a [employer] 11/2016 that revealed LVEF 72% and no ischemia.  Amlodipine was added to her regimen and her blood pressure has been much better controlled since that time.  [name] reported chest discomfort.  She was referred for [facility] 09/2018 that revealed LVEF 77% and no ischemia. She was diagnosed with breast cancer.  She had a routine mammogram in December and was found to have localized disease.  She underwent lumpectomy followed by radiation.    At the last visit her blood pressure was mildly elevated, lasix was switched to chlorthalidone. She reported atypical chest pain and had a coronary CTA 07/2021 that showed mild plaque in the LAD, RCA, and OM1. Her calcium score was 460 which was 85th percentile. She was under stress from caring for her husband with alzheimer's  Today, the patient states that she, has been feeling ok. She is grieving for her husband who passed away a year ago from CHF at 49.  Her blood pressure has been fairly controlled recently. She has not been talking chlorthalidone due to her low blood pressure during and after surgery (as low as 92/46). She notes that she has been stress-eating due to her grief.  She  had a total knee replacement which she is still recovering from. She is starting to wind down her resulting physical therapy [facility], stationary biking, or walking.  She denies any palpitations, chest pain, shortness of breath, or peripheral edema. No lightheadedness, headaches, syncope, orthopnea, or PND.  Past Medical History:  Diagnosis Date   Anxiety    Arthritis    Back, Hip- right   Atypical chest pain 06/25/2021   Back pain    Cancer ([place])    right breast   Cataract    Colitis    Coronary artery calcification 02/20/2020   Diabetes mellitus without complication (HCC)    Type II   Edema 03/05/2021   Edema of both lower extremities    Endometriosis    Family history of adverse reaction to anesthesia    Father - N/V - blood pressure; daughter N/V   Family history of breast cancer    Family history of colon cancer    Family history of kidney cancer    Family history of prostate cancer    Fatty liver    GERD (gastroesophageal reflux disease)    History of hiatal hernia    History of kidney stones    History of ulcerative colitis    Hypertension    Insomnia    Joint pain    Kidney problem    Lower extremity edema 03/05/2021   Lumbar disc disease    L4  L5   Neuropathy    Osteoarthritis    Other hyperlipidemia    Palpitations    Personal history of radiation therapy    Pneumonia    1983, 2003   Pure hypercholesterolemia 02/20/2020   PVCs (premature ventricular contractions)    benign   Umbilical hernia    Vitamin D deficiency    Wears glasses    Past Surgical History:  Procedure Laterality Date   APPENDECTOMY     BREAST BIOPSY  08-13-2009  DR TSUEI   EXCISION LEFT NIPPLE DUCT   BREAST EXCISIONAL BIOPSY Left    x2   BREAST EXCISIONAL BIOPSY Right    BREAST LUMPECTOMY Right 11/16/2018   invasive ductal   CHOLECYSTECTOMY  1992   COLON RESECTION  1986   ULCERATIVE COLITIS   COLON SURGERY     ESOPHAGEAL  MANOMETRY N/A 06/11/2013   Procedure: ESOPHAGEAL MANOMETRY (EM);  Surgeon: Jeryl Columbia, MD;  Location: [facility];  Service: Endoscopy;  Laterality: N/A;   ESOPHAGOGASTRODUODENOSCOPY (EGD) WITH PROPOFOL N/A 05/31/2016   Procedure: ESOPHAGOGASTRODUODENOSCOPY (EGD) WITH PROPOFOL;  Surgeon: Clarene Essex, MD;  Location: [facility] ENDOSCOPY;  Service: Endoscopy;  Laterality: N/A;   EXCISION RIGHT BREAST MASS   10-20-2005  DR Collier [place] YOUNG   EYE SURGERY Bilateral 2023   cataract   FRACTURE SURGERY     left arm   kidney stone removed  12/2009   KNEE ARTHROSCOPY Left    MCL   LEFT THUMB CARPOMETACARPAL JOINT SUSPENSIONPLASTY  04-06-2006  DR Burney Gauze   LEFT WRIST ARTHROSCOPY W/ DEBRIDEMENT AND REMOVAL CYST  03-26-2004  DR Burney Gauze   MYOMECTOMY     RADIOACTIVE SEED GUIDED PARTIAL MASTECTOMY WITH AXILLARY SENTINEL LYMPH NODE BIOPSY Right 11/16/2018   Procedure: RIGHT BREAST RADIOACTIVE SEED GUIDED PARTIAL MASTECTOMY WITH  SENTINEL  NODE BIOPSY;  Surgeon: Coralie Keens, MD;  Location: [place];  Service: General;  Laterality: Right;   RIGHT COLECTOMY     RIGHT URETEROSCOPIC STONE EXTRACTION  12-11-2009  DR [place] HIP ARTHROPLASTY Right 11/26/2016   Procedure: RIGHT TOTAL HIP ARTHROPLASTY ANTERIOR APPROACH;  Surgeon: Mcarthur Rossetti, MD;  Location: [facility];  Service: Orthopedics;  Laterality: Right;   TOTAL KNEE ARTHROPLASTY Left 12/02/2017   Procedure: LEFT TOTAL KNEE ARTHROPLASTY;  Surgeon: Mcarthur Rossetti, MD;  Location: [facility];  Service: Orthopedics;  Laterality: Left;   TOTAL KNEE ARTHROPLASTY Right 08/03/2022   Procedure: RIGHT TOTAL KNEE ARTHROPLASTY;  Surgeon: Mcarthur Rossetti, MD;  Location: [place];  Service: Orthopedics;  Laterality: Right;   WRIST SURGERY     both     Current Meds  Medication Sig   aspirin EC 81 MG tablet Take 81 mg by mouth daily. Swallow whole.   B Complex-C (B-COMPLEX WITH VITAMIN C) tablet Take 1 tablet  by mouth daily.   chlorthalidone (HYGROTON) 25 MG tablet TAKE ONE TABLET BY MOUTH ONCE DAILY   escitalopram (LEXAPRO) 10 MG tablet Take 1 tablet (10 mg total) by mouth daily.   gabapentin (NEURONTIN) 300 MG capsule Take 600 mg by mouth at bedtime.    HYDROcodone-acetaminophen (NORCO) 10-325 MG tablet Take 1 tablet by mouth in the morning, at noon, and at bedtime.   Lancets (ONETOUCH DELICA PLUS [id number]) MISC    lidocaine (LMX) 4 % cream Apply 1 application topically daily as needed (for joint pain).    methocarbamol (ROBAXIN) 500 MG tablet Take 1 tablet (500  mg total) by mouth every 6 (six) hours as needed for muscle spasms. (Patient taking differently: Take 500 mg by mouth at bedtime.)   ONETOUCH VERIO test strip    pantoprazole (PROTONIX) 40 MG tablet Take 40 mg by mouth 2 (two) times daily.   phenylephrine (NEO-SYNEPHRINE) 0.25 % nasal spray Place 1 spray into both nostrils at bedtime as needed for congestion.   pioglitazone (ACTOS) 30 MG tablet Take 30 mg by mouth daily.   rosuvastatin (CRESTOR) 20 MG tablet TAKE 1 TABLET BY MOUTH EVERY DAY   [DISCONTINUED] aspirin 81 MG chewable tablet Chew 1 tablet (81 mg total) by mouth 2 (two) times daily. (Patient taking differently: Chew 81 mg by mouth daily.)     Allergies:   Ciprofloxacin hcl, Percocet [oxycodone-acetaminophen], Penicillins, Sulfa antibiotics, Tape, Tetanus toxoids, and Jardiance [empagliflozin]   Social History   Tobacco Use   Smoking status: Former    Packs/day: 1.00    Years: 10.00    Total pack years: 10.00    Types: Cigarettes    Quit date: 09/22/1982    Years since quitting: 40.2   Smokeless tobacco: Never   Tobacco comments:    quit 1983  Vaping Use   Vaping Use: Never used  Substance Use Topics   Alcohol use: No   Drug use: No     Family Hx: The patient's family history includes Breast cancer in her cousin and paternal aunt; Cancer in her father, maternal aunt, and maternal grandfather; Colon cancer in her  paternal aunt; Diabetes in her father, maternal grandfather, maternal grandmother, and mother; Heart disease in her father, maternal grandfather, and maternal grandmother; Heart failure in her father; Hyperlipidemia in her mother; Hypertension in her father and mother; Kidney disease in her father and mother; Parkinson's disease in her mother; Prostate cancer in her paternal uncle and another family member; Stroke in her father, paternal grandfather, and paternal grandmother.  ROS:   Please see the history of present illness.    (+) Stress/Grief. All other systems reviewed and are negative.   Prior CV studies:   The following studies were reviewed today:  Echo 03/31/2021:  1. Left ventricular ejection fraction, by estimation, is 60 to 65%. The  left ventricle has normal function. The left ventricle has no regional  wall motion abnormalities. There is mild left ventricular hypertrophy.  Left ventricular diastolic parameters  are consistent with Grade I diastolic dysfunction (impaired relaxation).  Elevated left ventricular end-diastolic pressure.   2. Right ventricular systolic function is hyperdynamic. The right  ventricular size is normal.   3. The mitral valve is grossly normal. Trivial mitral valve  regurgitation.   4. The aortic valve is tricuspid. Aortic valve regurgitation is not  visualized.   Comparison(s): No prior Echocardiogram.   Lexiscan Myoview 09/18/18: The left ventricular ejection fraction is hyperdynamic (>65%). Nuclear stress EF: 77%. There was no ST segment deviation noted during stress. The study is normal. This is a low risk study.   Normal pharmacologic nuclear stress test with no evidence for prior infarct or ischemia.    Lexiscan Myoview 11/19/16: Nuclear stress EF: 72%. The left ventricular ejection fraction is hyperdynamic (>65%). There was no ST segment deviation noted during stress. This is a low risk study. No perfusion defects.    Labs/Other  Tests and Data Reviewed:    EKG: The EKG is personally reviewed. 12/07/2022: The EKG was not ordered today. 06/25/2021: Sinus rhythm. Rate 66 bpm. 03/05/2021: Sinus rhythm.  Rate 66 bpm. 02/18/2020:  Sinus rhythm. Rate 97 bpm. Left axis deviation. Low voltage.  Recent Labs: 05/11/2022: TSH 2.150 06/08/2022: ALT 12 08/04/2022: BUN 16; Creatinine, Ser 1.11; Hemoglobin 9.5; Platelets 205; Potassium 3.8; Sodium 135   Recent Lipid Panel Lab Results  Component Value Date/Time   CHOL 128 05/11/2022 09:54 AM   TRIG 83 05/11/2022 09:54 AM   HDL 56 05/11/2022 09:54 AM   LDLCALC 56 05/11/2022 09:54 AM     09/17/2019: Total cholesterol 119, triglycerides 132, HDL 49, LDL 43  Wt Readings from Last 3 Encounters:  12/07/22 205 lb (93 kg)  12/01/22 200 lb (90.7 kg)  10/12/22 196 lb (88.9 kg)     Objective:   VS:  BP 130/62   Pulse 65   Ht '5\' 4"'$  (1.626 m)   Wt 205 lb (93 kg)   BMI 35.19 kg/m  , BMI Body mass index is 35.19 kg/m. GENERAL:  Well appearing HEENT: Pupils equal round and reactive, fundi not visualized, oral mucosa unremarkable NECK: No JVD.  Waveform within normal limits, carotid upstroke brisk and symmetric, no bruits LUNGS:  Clear to auscultation bilaterally HEART:  RRR.  PMI not displaced or sustained,S1 and S2 within normal limits, no S3, no S4, no clicks, no rubs, no murmurs ABD:  Flat, positive bowel sounds normal in frequency in pitch, no bruits, no rebound, no guarding, no midline pulsatile mass, no hepatomegaly, no splenomegaly EXT:  2 plus pulses throughout, No edema, no cyanosis no clubbing SKIN:  No rashes no nodules NEURO:  Cranial nerves II through XII grossly intact, motor grossly intact throughout PSYCH:  Cognitively intact, oriented to person place and time  ASSESSMENT & PLAN:   Essential hypertension Blood pressure is well-controlled.  She is no longer on chlorthalidone.  It was low after surgery and the medication was discontinued.  She was encouraged to  continue her exericse and keep working with [facility] [employer] [facility].   Coronary artery calcification Not restrictive disease on coronary CTA 07/2021.  She has no anginal symptoms.  She was encouraged to increase her exercise to at least 150 minutes.  Continue aspirin and rosuvastatin.  Lipids are at goal.  Her LDL was 53 on 09/2022.  No changes at this time.  Pure hypercholesterolemia Lipids are well-controlled on rosuvastatin as above.     Medication Adjustments/Labs and Tests Ordered: Current medicines are reviewed at length with the patient today.  Concerns regarding medicines are outlined above.   Tests Ordered: No orders of the defined types were placed in this encounter.    Medication Changes: No orders of the defined types were placed in this encounter.    Disposition: FU with Husayn Reim C. Oval Linsey, MD, [facility] in 1 year.  Plan: Remove chlorthalidone due to low blood pressure post surgery, she has been at goal pressure. Check blood pressure to make sure it is under control going forward.   I,Coren O'Brien,acting as a Education administrator for [place], MD.,have documented all relevant documentation on the behalf of Skeet Latch, MD,as directed by  Skeet Latch, MD while in the presence of Skeet Latch, MD.  I, [place] Oval Linsey, MD have reviewed all documentation for this visit.  The documentation of the exam, diagnosis, procedures, and orders on 12/07/2022 are all accurate and complete.

## 2022-12-07 NOTE — Assessment & Plan Note (Signed)
Blood pressure is well-controlled.  She is no longer on chlorthalidone.  It was low after surgery and the medication was discontinued.  She was encouraged to continue her exericse and keep working with [facility] [employer] [facility].

## 2022-12-07 NOTE — Patient Instructions (Addendum)
Medication Instructions:  Your physician recommends that you continue on your current medications as directed. Please refer to the Current Medication list given to you today.   *If you need a refill on your cardiac medications before your next appointment, please call your pharmacy*  Lab Work: NONE  Testing/Procedures: NONE  Follow-Up: At [facility], you and your health needs are our priority.  As part of our continuing mission to provide you with exceptional heart care, we have created designated Provider Care Teams.  These Care Teams include your primary Cardiologist (physician) and Advanced Practice Providers (APPs -  Physician Assistants and Nurse Practitioners) who all work together to provide you with the care you need, when you need it.  We recommend signing up for the patient portal called "MyChart".  Sign up information is provided on this After Visit Summary.  MyChart is used to connect with patients for Virtual Visits (Telemedicine).  Patients are able to view lab/test results, encounter notes, upcoming appointments, etc.  Non-urgent messages can be sent to your provider as well.   To learn more about what you can do with MyChart, go to [email or web address].    Your next appointment:   12 month(s)  Provider:   Skeet Latch, MD    Other Instructions MONITOR BLOOD PRESSURE COUPLE TIMES A MONTHS. CALL THE OFFICE IF IT IS NOT CONSISTENTLY BELOW 130/80  Exercise recommendations: The American Heart Association recommends 150 minutes of moderate intensity exercise weekly. Try 30 minutes of moderate intensity exercise 4-5 times per week. This could include walking, jogging, or swimming.

## 2022-12-07 NOTE — Assessment & Plan Note (Signed)
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  She has no anginal symptoms.  She was encouraged to increase her exercise to at least 150 minutes.  Continue aspirin and rosuvastatin.  Lipids are at goal.  Her LDL was 53 on 09/2022.  No changes at this time.

## 2022-12-07 NOTE — Assessment & Plan Note (Signed)
Lipids are well-controlled on rosuvastatin as above.

## 2022-12-08 DIAGNOSIS — M25561 Pain in right knee: Secondary | ICD-10-CM | POA: Diagnosis not present

## 2022-12-08 DIAGNOSIS — M6281 Muscle weakness (generalized): Secondary | ICD-10-CM | POA: Diagnosis not present

## 2022-12-08 DIAGNOSIS — M25461 Effusion, right knee: Secondary | ICD-10-CM | POA: Diagnosis not present

## 2022-12-08 DIAGNOSIS — M25661 Stiffness of right knee, not elsewhere classified: Secondary | ICD-10-CM | POA: Diagnosis not present

## 2022-12-14 MED ORDER — VITAMIN D (ERGOCALCIFEROL) 1.25 MG (50000 UNIT) PO CAPS: 50000.0000 [IU] | ORAL_CAPSULE | ORAL | 0 refills | Status: DC

## 2022-12-14 NOTE — Progress Notes (Signed)
Chief Complaint:   OBESITY Lauren [name] is here to discuss her progress with her obesity treatment plan along with follow-up of her obesity related diagnoses. Lauren [name] is on the Category 2 Plan and states she is following her eating plan approximately 60% of the time. Lauren [name] states she is doing physical therapy for 60 minutes 2-3 times per week.  Today's visit was #: 21 Starting weight: 219 lbs Starting date: 04/22/2021 Today's weight: 200 lbs Today's date: 12/01/2022 Total lbs lost to date: 19 Total lbs lost since last in-office visit: 0  Interim History: Lauren [name] is struggling with following her eating plan. She is dealing with SAD and motivation to change has been low.   Subjective:   1. Vitamin D deficiency Lauren [name] has been off Vitamin D for 3 months. She has been close to over-replacement previously but fasting has worsen.   2. 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 She is open to medication short-term to help her get through.   Assessment/Plan:   1. Vitamin D deficiency We will check labs today. Chevon agreed to start prescription Vitamin D 50,000 IU weekly #4 with no refills; if Vitamin D is below 50.  - VITAMIN D 25 Hydroxy (Vit-D Deficiency, Fractures)  2. 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   - escitalopram (LEXAPRO) 10 MG tablet; Take 1 tablet (10 mg total) by mouth daily.  Dispense: 30 tablet; Refill: 0  3. BMI 34.0-34.9,adult  4. Obesity, Beginning BMI 37.59 Lauren [name] is currently in the action stage of change. As such, her goal is to maintain weight for now. She has agreed to the Category 2 Plan.   Goal is to maintain her weight for now and work on her SAD first, then get back to a structured plan.   Exercise goals: As is.   Behavioral modification strategies: increasing lean protein intake and emotional eating  strategies.  Lauren [name] has agreed to follow-up with our clinic in 4 weeks. She was informed of the importance of frequent follow-up visits to maximize her success with intensive lifestyle modifications for her multiple health conditions.   Objective:   Blood pressure 138/72, pulse 79, temperature (!) 97.3 F (36.3 C), height 5' 4"$  (1.626 m), weight 200 lb (90.7 kg), SpO2 97 %. Body mass index is 34.33 kg/m.  General: Cooperative, alert, well developed, in no acute distress. HEENT: Conjunctivae and lids unremarkable. Cardiovascular: Regular rhythm.  Lungs: Normal work of breathing. Neurologic: No focal deficits.   Lab Results  Component Value Date   CREATININE 1.11 (H) 08/04/2022   BUN 16 08/04/2022   NA 135 08/04/2022   K 3.8 08/04/2022   CL 101 08/04/2022   CO2 24 08/04/2022   Lab Results  Component Value Date   ALT 12 06/08/2022   AST 16 06/08/2022   ALKPHOS 50 06/08/2022   BILITOT 0.4 06/08/2022   Lab Results  Component Value Date   HGBA1C 6.1 (H) 07/23/2022   HGBA1C 6.1 (H) 05/11/2022   HGBA1C 5.9 (H) 12/10/2021   HGBA1C 6.7 (H) 04/22/2021   HGBA1C 8.9 (H) 11/13/2018   Lab Results  Component Value Date   INSULIN 11.1 05/11/2022   INSULIN 13.3 12/10/2021   INSULIN 22.3 04/22/2021   Lab Results  Component Value Date   TSH 2.150 05/11/2022   Lab Results  Component Value Date   CHOL 128 05/11/2022  HDL 56 05/11/2022   LDLCALC 56 05/11/2022   TRIG 83 05/11/2022   Lab Results  Component Value Date   VD25OH 55.8 12/01/2022   VD25OH 69.3 05/11/2022   VD25OH 86.9 12/10/2021   Lab Results  Component Value Date   WBC 7.8 08/04/2022   HGB 9.5 (L) 08/04/2022   HCT 27.9 (L) 08/04/2022   MCV 88.9 08/04/2022   PLT 205 08/04/2022   No results found for: "IRON", "TIBC", "FERRITIN"  Attestation Statements:   Reviewed by clinician on day of visit: allergies, medications, problem list, medical history, surgical history, family history, social history, and  previous encounter notes.   I, Trixie [name], am acting as transcriptionist for Dennard Nip, MD.  I have reviewed the above documentation for accuracy and completeness, and I agree with the above. -  Dennard Nip, MD

## 2022-12-15 ENCOUNTER — Encounter (HOSPITAL_BASED_OUTPATIENT_CLINIC_OR_DEPARTMENT_OTHER): Payer: Self-pay | Admitting: Cardiovascular Disease

## 2022-12-15 ENCOUNTER — Encounter (INDEPENDENT_AMBULATORY_CARE_PROVIDER_SITE_OTHER): Payer: Self-pay | Admitting: Family Medicine

## 2022-12-15 DIAGNOSIS — M47817 Spondylosis without myelopathy or radiculopathy, lumbosacral region: Secondary | ICD-10-CM | POA: Diagnosis not present

## 2022-12-15 DIAGNOSIS — M25551 Pain in right hip: Secondary | ICD-10-CM | POA: Diagnosis not present

## 2022-12-15 DIAGNOSIS — G894 Chronic pain syndrome: Secondary | ICD-10-CM | POA: Diagnosis not present

## 2022-12-15 DIAGNOSIS — M150 Primary generalized (osteo)arthritis: Secondary | ICD-10-CM | POA: Diagnosis not present

## 2022-12-16 NOTE — Telephone Encounter (Signed)
FYI

## 2022-12-24 ENCOUNTER — Other Ambulatory Visit (INDEPENDENT_AMBULATORY_CARE_PROVIDER_SITE_OTHER): Payer: Self-pay | Admitting: Family Medicine

## 2022-12-24 DIAGNOSIS — F338 Other recurrent depressive disorders: Secondary | ICD-10-CM

## 2022-12-27 DIAGNOSIS — K219 Gastro-esophageal reflux disease without esophagitis: Secondary | ICD-10-CM | POA: Diagnosis not present

## 2022-12-27 DIAGNOSIS — E1169 Type 2 diabetes mellitus with other specified complication: Secondary | ICD-10-CM | POA: Diagnosis not present

## 2022-12-27 DIAGNOSIS — E7800 Pure hypercholesterolemia, unspecified: Secondary | ICD-10-CM | POA: Diagnosis not present

## 2022-12-27 DIAGNOSIS — I10 Essential (primary) hypertension: Secondary | ICD-10-CM | POA: Diagnosis not present

## 2022-12-28 NOTE — Telephone Encounter (Signed)
Appreciate her giving [place] the update. If you will just be sure her medication list is updated. TY!

## 2022-12-29 ENCOUNTER — Ambulatory Visit (INDEPENDENT_AMBULATORY_CARE_PROVIDER_SITE_OTHER): Payer: PPO | Admitting: Family Medicine

## 2022-12-29 ENCOUNTER — Encounter (INDEPENDENT_AMBULATORY_CARE_PROVIDER_SITE_OTHER): Payer: Self-pay | Admitting: Family Medicine

## 2022-12-29 VITALS — BP 126/64 | HR 66 | Temp 97.8°F | Ht 64.0 in | Wt 203.0 lb

## 2022-12-29 DIAGNOSIS — Z6834 Body mass index (BMI) 34.0-34.9, adult: Secondary | ICD-10-CM | POA: Diagnosis not present

## 2022-12-29 DIAGNOSIS — J309 Allergic rhinitis, unspecified: Secondary | ICD-10-CM | POA: Insufficient documentation

## 2022-12-29 DIAGNOSIS — E559 Vitamin D deficiency, unspecified: Secondary | ICD-10-CM

## 2022-12-29 DIAGNOSIS — F338 Other recurrent depressive disorders: Secondary | ICD-10-CM

## 2022-12-29 DIAGNOSIS — J3089 Other allergic rhinitis: Secondary | ICD-10-CM

## 2022-12-29 DIAGNOSIS — E669 Obesity, unspecified: Secondary | ICD-10-CM | POA: Diagnosis not present

## 2022-12-29 MED ORDER — FLUTICASONE PROPIONATE 50 MCG/ACT NA SUSP: 2.0000 | Freq: Every day | NASAL | 0 refills | Status: DC | PRN

## 2022-12-29 MED ORDER — ESCITALOPRAM OXALATE 10 MG PO TABS: 10.0000 mg | ORAL_TABLET | Freq: Every day | ORAL | 0 refills | Status: DC

## 2022-12-29 MED ORDER — ESCITALOPRAM OXALATE 20 MG PO TABS: 10.0000 mg | ORAL_TABLET | Freq: Every day | ORAL | 0 refills | Status: DC

## 2022-12-29 MED ORDER — VITAMIN D (ERGOCALCIFEROL) 1.25 MG (50000 UNIT) PO CAPS: 50000.0000 [IU] | ORAL_CAPSULE | ORAL | 0 refills | Status: DC

## 2022-12-31 ENCOUNTER — Encounter (INDEPENDENT_AMBULATORY_CARE_PROVIDER_SITE_OTHER): Payer: Self-pay | Admitting: Family Medicine

## 2022-12-31 DIAGNOSIS — Z23 Encounter for immunization: Secondary | ICD-10-CM | POA: Diagnosis not present

## 2023-01-03 ENCOUNTER — Other Ambulatory Visit (INDEPENDENT_AMBULATORY_CARE_PROVIDER_SITE_OTHER): Payer: Self-pay | Admitting: Family Medicine

## 2023-01-03 DIAGNOSIS — F338 Other recurrent depressive disorders: Secondary | ICD-10-CM

## 2023-01-03 MED ORDER — ESCITALOPRAM OXALATE 20 MG PO TABS: 20.0000 mg | ORAL_TABLET | Freq: Every day | ORAL | 0 refills | Status: DC

## 2023-01-06 ENCOUNTER — Encounter: Payer: Self-pay | Admitting: Radiology

## 2023-01-17 NOTE — Progress Notes (Unsigned)
Chief Complaint:   OBESITY Lauren [name] is here to discuss her progress with her obesity treatment plan along with follow-up of her obesity related diagnoses. Lauren [name] is on the Category 2 Plan and states she is following her eating plan approximately 50% of the time. Lauren [name] states she is walking for 30 minutes 2-3 times per week.  Today's visit was #: 22 Starting weight: 219 lbs Starting date: 04/22/2021 Today's weight: 203 lbs Today's date: 12/29/2022 Total lbs lost to date: 16 Total lbs lost since last in-office visit: 0  Interim History: Liliany is struggling with weight loss.  She has been dealing with other health issues and has not been able to concentrate on weight loss.  She wants to look at other eating plan options.  Subjective:   1. 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  She still has some anxiety.  2. Allergic rhinitis due to other allergic trigger, unspecified seasonality Lauren [name] notes increased seasonal allergies in the last 1 to 2 weeks.  She wants to avoid extra pills.  3. Vitamin D deficiency Lauren [name] is on vitamin D prescription, and she denies nausea, vomiting, or muscle weakness.  Assessment/Plan:   1. 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  2. Allergic rhinitis due to other allergic trigger, unspecified seasonality Lauren [name] agreed to start Flonase 2 sprays into each nostril, with no refills.  - fluticasone (FLONASE) 50 MCG/ACT nasal spray; Place 2 sprays into both nostrils daily as needed for allergies or rhinitis.  Dispense: 9.9 mL; Refill: 0  3. Vitamin D deficiency Lauren [name] will continue prescription vitamin D, and we will refill for 1 month.  - Vitamin D, Ergocalciferol, (DRISDOL) 1.25 MG (50000 UNIT) CAPS capsule; Take 1 capsule (50,000 Units total) by mouth every 7 (seven) days.  Dispense: 4 capsule; Refill: 0  4. BMI  34.0-34.9,adult  5. Obesity, Beginning BMI 37.59 Lauren [name] is currently in the action stage of change. As such, her goal is to continue with weight loss efforts. She has agreed to change to the Category 2 Plan or keeping a food journal and adhering to recommended goals of 1200-1300 calories and 75+ grams of protein daily.   Exercise goals: As is.   Behavioral modification strategies: increasing lean protein intake.  Lauren [name] has agreed to follow-up with our clinic in 4 weeks. She was informed of the importance of frequent follow-up visits to maximize her success with intensive lifestyle modifications for her multiple health conditions.   Objective:   Blood pressure 126/64, pulse 66, temperature 97.8 F (36.6 C), height 5\' 4"  (1.626 m), weight 203 lb (92.1 kg), SpO2 96 %. Body mass index is 34.84 kg/m.  Lab Results  Component Value Date   CREATININE 1.11 (H) 08/04/2022   BUN 16 08/04/2022   NA 135 08/04/2022   K 3.8 08/04/2022   CL 101 08/04/2022   CO2 24 08/04/2022   Lab Results  Component Value Date   ALT 12 06/08/2022   AST 16 06/08/2022   ALKPHOS 50 06/08/2022   BILITOT 0.4 06/08/2022   Lab Results  Component Value Date   HGBA1C 6.1 (H) 07/23/2022   HGBA1C 6.1 (H) 05/11/2022   HGBA1C 5.9 (H) 12/10/2021   HGBA1C 6.7 (H) 04/22/2021   HGBA1C 8.9 (H) 11/13/2018   Lab Results  Component Value Date   INSULIN 11.1 05/11/2022   INSULIN 13.3 12/10/2021   INSULIN 22.3  04/22/2021   Lab Results  Component Value Date   TSH 2.150 05/11/2022   Lab Results  Component Value Date   CHOL 128 05/11/2022   HDL 56 05/11/2022   LDLCALC 56 05/11/2022   TRIG 83 05/11/2022   Lab Results  Component Value Date   VD25OH 55.8 12/01/2022   VD25OH 69.3 05/11/2022   VD25OH 86.9 12/10/2021   Lab Results  Component Value Date   WBC 7.8 08/04/2022   HGB 9.5 (L) 08/04/2022   HCT 27.9 (L) 08/04/2022   MCV 88.9 08/04/2022   PLT 205 08/04/2022   No results found for: "IRON", "TIBC",  "FERRITIN"  Attestation Statements:   Reviewed by clinician on day of visit: allergies, medications, problem list, medical history, surgical history, family history, social history, and previous encounter notes.  Time spent on visit including pre-visit chart review and post-visit care and charting was 40 minutes.   I, Trixie [name], am acting as transcriptionist for Dennard Nip, MD.  I have reviewed the above documentation for accuracy and completeness, and I agree with the above. -  Dennard Nip, MD

## 2023-01-21 ENCOUNTER — Other Ambulatory Visit (INDEPENDENT_AMBULATORY_CARE_PROVIDER_SITE_OTHER): Payer: Self-pay | Admitting: Family Medicine

## 2023-01-21 DIAGNOSIS — J3089 Other allergic rhinitis: Secondary | ICD-10-CM

## 2023-01-27 ENCOUNTER — Encounter (INDEPENDENT_AMBULATORY_CARE_PROVIDER_SITE_OTHER): Payer: Self-pay | Admitting: Family Medicine

## 2023-01-27 ENCOUNTER — Ambulatory Visit (INDEPENDENT_AMBULATORY_CARE_PROVIDER_SITE_OTHER): Payer: PPO | Admitting: Family Medicine

## 2023-01-27 VITALS — BP 128/72 | HR 63 | Temp 97.3°F | Ht 64.0 in | Wt 205.0 lb

## 2023-01-27 DIAGNOSIS — E669 Obesity, unspecified: Secondary | ICD-10-CM | POA: Diagnosis not present

## 2023-01-27 DIAGNOSIS — E559 Vitamin D deficiency, unspecified: Secondary | ICD-10-CM | POA: Diagnosis not present

## 2023-01-27 DIAGNOSIS — Z6835 Body mass index (BMI) 35.0-35.9, adult: Secondary | ICD-10-CM

## 2023-01-27 DIAGNOSIS — M150 Primary generalized (osteo)arthritis: Secondary | ICD-10-CM | POA: Diagnosis not present

## 2023-01-27 DIAGNOSIS — F3289 Other specified depressive episodes: Secondary | ICD-10-CM

## 2023-01-27 DIAGNOSIS — G894 Chronic pain syndrome: Secondary | ICD-10-CM | POA: Diagnosis not present

## 2023-01-27 DIAGNOSIS — M25551 Pain in right hip: Secondary | ICD-10-CM | POA: Diagnosis not present

## 2023-01-27 DIAGNOSIS — M47817 Spondylosis without myelopathy or radiculopathy, lumbosacral region: Secondary | ICD-10-CM | POA: Diagnosis not present

## 2023-01-27 DIAGNOSIS — J3089 Other allergic rhinitis: Secondary | ICD-10-CM

## 2023-01-27 MED ORDER — VITAMIN D (ERGOCALCIFEROL) 1.25 MG (50000 UNIT) PO CAPS: 50000.0000 [IU] | ORAL_CAPSULE | ORAL | 0 refills | Status: DC

## 2023-01-27 MED ORDER — ESCITALOPRAM OXALATE 20 MG PO TABS: 20.0000 mg | ORAL_TABLET | Freq: Every day | ORAL | 0 refills | Status: DC

## 2023-01-27 MED ORDER — FLUTICASONE PROPIONATE 50 MCG/ACT NA SUSP: 2.0000 | Freq: Every day | NASAL | 0 refills | Status: DC | PRN

## 2023-01-31 NOTE — Progress Notes (Unsigned)
Chief Complaint:   OBESITY Lauren [name] is here to discuss her progress with her obesity treatment plan along with follow-up of her obesity related diagnoses. Lauren [name] is on keeping a food journal and adhering to recommended goals of 1200-1300 calories and 75+ grams of protein and states she is following her eating plan approximately 60% of the time. Lauren [name] states she is doing 0 minutes 0 times per week.  Today's visit was #: 23 Starting weight: 219 lbs Starting date: 04/22/2021 Today's weight: 205 lbs Today's date: 01/27/2023 Total lbs lost to date: 14 Total lbs lost since last in-office visit: 0  Interim History: Lauren [name] has struggled more with weight loss.  She is working on meal planning, but she has had more simple carbohydrates in her diet recently.  Subjective:   1. Vitamin D deficiency Lauren [name] is on vitamin D, and she requests a refill today.  She notes her fatigue is improving.  2. Allergic rhinitis due to other allergic trigger, unspecified seasonality Lauren [name] notes her allergy symptoms have increased this Spring, and she requests a refill for Flonase.  3. Emotional Eating Behavior Lauren [name] has done more comfort eating recently, especially in the afternoon.  She is feeling better overall on Lexapro now.  Assessment/Plan:   1. Vitamin D deficiency Lauren [name] will continue prescription vitamin D, and we will refill for 1 month.  - Vitamin D, Ergocalciferol, (DRISDOL) 1.25 MG (50000 UNIT) CAPS capsule; Take 1 capsule (50,000 Units total) by mouth every 7 (seven) days.  Dispense: 4 capsule; Refill: 0  2. Allergic rhinitis due to other allergic trigger, unspecified seasonality Lauren [name] will continue Flonase, and we will refill for 1 month.  - fluticasone (FLONASE) 50 MCG/ACT nasal spray; Place 2 sprays into both nostrils daily as needed for allergies or rhinitis.  Dispense: 9.9 mL; Refill: 0  3. Emotional Eating Behavior Lauren [name] will continue Lexapro, and we will refill for 1  month.  Emotional eating behavior strategies were discussed and she will work on using these strategies.  - escitalopram (LEXAPRO) 20 MG tablet; Take 1 tablet (20 mg total) by mouth daily.  Dispense: 30 tablet; Refill: 0  4. BMI 35.0-35.9,adult  5. Obesity, Beginning BMI 37.59 Lauren [name] is currently in the action stage of change. As such, her goal is to continue with weight loss efforts. She has agreed to the Category 2 Plan.   Behavioral modification strategies: increasing lean protein intake.  Lauren [name] has agreed to follow-up with our clinic in 4 weeks. She was informed of the importance of frequent follow-up visits to maximize her success with intensive lifestyle modifications for her multiple health conditions.   Objective:   Blood pressure 128/72, pulse 63, temperature (!) 97.3 F (36.3 C), height 5\' 4"  (1.626 m), weight 205 lb (93 kg), SpO2 94 %. Body mass index is 35.19 kg/m.  Lab Results  Component Value Date   CREATININE 1.11 (H) 08/04/2022   BUN 16 08/04/2022   NA 135 08/04/2022   K 3.8 08/04/2022   CL 101 08/04/2022   CO2 24 08/04/2022   Lab Results  Component Value Date   ALT 12 06/08/2022   AST 16 06/08/2022   ALKPHOS 50 06/08/2022   BILITOT 0.4 06/08/2022   Lab Results  Component Value Date   HGBA1C 6.1 (H) 07/23/2022   HGBA1C 6.1 (H) 05/11/2022   HGBA1C 5.9 (H) 12/10/2021   HGBA1C 6.7 (H) 04/22/2021   HGBA1C 8.9 (H) 11/13/2018   Lab Results  Component Value Date   INSULIN 11.1 05/11/2022  INSULIN 13.3 12/10/2021   INSULIN 22.3 04/22/2021   Lab Results  Component Value Date   TSH 2.150 05/11/2022   Lab Results  Component Value Date   CHOL 128 05/11/2022   HDL 56 05/11/2022   LDLCALC 56 05/11/2022   TRIG 83 05/11/2022   Lab Results  Component Value Date   VD25OH 55.8 12/01/2022   VD25OH 69.3 05/11/2022   VD25OH 86.9 12/10/2021   Lab Results  Component Value Date   WBC 7.8 08/04/2022   HGB 9.5 (L) 08/04/2022   HCT 27.9 (L) 08/04/2022    MCV 88.9 08/04/2022   PLT 205 08/04/2022   No results found for: "IRON", "TIBC", "FERRITIN"  Attestation Statements:   Reviewed by clinician on day of visit: allergies, medications, problem list, medical history, surgical history, family history, social history, and previous encounter notes.   I, Trixie [name], am acting as transcriptionist for Dennard Nip, MD.  I have reviewed the above documentation for accuracy and completeness, and I agree with the above. -  Dennard Nip, MD

## 2023-02-01 ENCOUNTER — Ambulatory Visit
Admission: RE | Admit: 2023-02-01 | Discharge: 2023-02-01 | Disposition: A | Discharge status: Home / Self Care | Payer: PPO | Source: Ambulatory Visit | Attending: Hematology and Oncology | Admitting: Hematology and Oncology

## 2023-02-01 DIAGNOSIS — Z170 Estrogen receptor positive status [ER+]: Secondary | ICD-10-CM

## 2023-02-01 DIAGNOSIS — Z1231 Encounter for screening mammogram for malignant neoplasm of breast: Secondary | ICD-10-CM | POA: Diagnosis not present

## 2023-02-08 DIAGNOSIS — K219 Gastro-esophageal reflux disease without esophagitis: Secondary | ICD-10-CM | POA: Diagnosis not present

## 2023-02-08 DIAGNOSIS — C7A092 Malignant carcinoid tumor of the stomach: Secondary | ICD-10-CM | POA: Diagnosis not present

## 2023-02-08 DIAGNOSIS — K317 Polyp of stomach and duodenum: Secondary | ICD-10-CM | POA: Diagnosis not present

## 2023-02-08 DIAGNOSIS — K319 Disease of stomach and duodenum, unspecified: Secondary | ICD-10-CM | POA: Diagnosis not present

## 2023-02-08 DIAGNOSIS — K449 Diaphragmatic hernia without obstruction or gangrene: Secondary | ICD-10-CM | POA: Diagnosis not present

## 2023-02-10 DIAGNOSIS — K317 Polyp of stomach and duodenum: Secondary | ICD-10-CM | POA: Diagnosis not present

## 2023-02-10 DIAGNOSIS — K319 Disease of stomach and duodenum, unspecified: Secondary | ICD-10-CM | POA: Diagnosis not present

## 2023-02-21 ENCOUNTER — Other Ambulatory Visit (INDEPENDENT_AMBULATORY_CARE_PROVIDER_SITE_OTHER): Payer: Self-pay | Admitting: Family Medicine

## 2023-02-21 DIAGNOSIS — J3089 Other allergic rhinitis: Secondary | ICD-10-CM

## 2023-02-24 ENCOUNTER — Ambulatory Visit (INDEPENDENT_AMBULATORY_CARE_PROVIDER_SITE_OTHER): Payer: HMO | Admitting: Family Medicine

## 2023-03-03 ENCOUNTER — Ambulatory Visit (INDEPENDENT_AMBULATORY_CARE_PROVIDER_SITE_OTHER): Payer: HMO | Admitting: Family Medicine

## 2023-03-08 ENCOUNTER — Other Ambulatory Visit (INDEPENDENT_AMBULATORY_CARE_PROVIDER_SITE_OTHER): Payer: Self-pay | Admitting: Family Medicine

## 2023-03-08 DIAGNOSIS — E559 Vitamin D deficiency, unspecified: Secondary | ICD-10-CM

## 2023-03-08 DIAGNOSIS — F3289 Other specified depressive episodes: Secondary | ICD-10-CM

## 2023-03-10 ENCOUNTER — Ambulatory Visit
Admission: RE | Admit: 2023-03-10 | Discharge: 2023-03-10 | Disposition: A | Discharge status: Home / Self Care | Payer: PPO | Source: Ambulatory Visit | Attending: Anesthesiology | Admitting: Anesthesiology

## 2023-03-10 ENCOUNTER — Other Ambulatory Visit: Payer: Self-pay | Admitting: Anesthesiology

## 2023-03-10 DIAGNOSIS — M25551 Pain in right hip: Secondary | ICD-10-CM

## 2023-03-10 DIAGNOSIS — M47817 Spondylosis without myelopathy or radiculopathy, lumbosacral region: Secondary | ICD-10-CM | POA: Diagnosis not present

## 2023-03-10 DIAGNOSIS — G894 Chronic pain syndrome: Secondary | ICD-10-CM | POA: Diagnosis not present

## 2023-03-10 DIAGNOSIS — M150 Primary generalized (osteo)arthritis: Secondary | ICD-10-CM | POA: Diagnosis not present

## 2023-03-15 NOTE — Progress Notes (Signed)
TeleHealth Visit:  This visit was completed with telemedicine (audio/video) technology. Saffire has verbally consented to this TeleHealth visit. The patient is located at home, the provider is located at home. The participants in this visit include the listed provider and patient. The visit was conducted today via MyChart video.  OBESITY Luz is here to discuss her progress with her obesity treatment plan along with follow-up of her obesity related diagnoses.   Today's visit was # 24 Starting weight: 219 lbs Starting date: 04/22/2021 Weight at last in office visit: 205 lbs on 01/27/23 Total weight loss: 14 lbs at last in office visit on 01/27/23. Today's reported weight (03/16/23):  211 lbs  Nutrition Plan: the Category 2 plan - 0% adherence.  Current exercise:  none  Interim History:  Weight has steadily increased since August of last year from 193 pounds to 205 pounds.  Her low weight since attending our clinic was 186 in February 2023. Today she reports a weight of 211 pounds reflecting a 6 pound weight gain since March 28. She is very upset with how she is doing with her eating currently.  She does not want to regain all of the weight she lost. She says she starts snacking at lunch and continues the rest of the day. She realizes protein intake is low and feels this may be part of the reason she is so tired. 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  Breakfast- cereal (corn flakes with fruit, raisin nut bran) and milk. Sometimes just yogurt.  Lunch- cheese crackers Dinner varies  She is experiencing grief and stress (deaths of mom/husband) which have caused stress eating. Her 67 yo son is addicted to heroin and frequently asks her for money.   Has severe back pain and is seeing Dr. Allie Bossier (ortho) and Dr. Thyra Breed for pain management  Assessment/Plan:  1. Type 2 Diabetes Mellitus with other specified complication,  without long-term current use of insulin HgbA1c is at goal. Last A1c was 6.1 Medication(s): Zepbound 2.5 mg SQ weekly  Lab Results  Component Value Date   HGBA1C 6.1 (H) 07/23/2022   HGBA1C 6.1 (H) 05/11/2022   HGBA1C 5.9 (H) 12/10/2021   Lab Results  Component Value Date   LDLCALC 56 05/11/2022   CREATININE 1.11 (H) 08/04/2022   No results found for: "GFR"  Plan: Start Mounjaro 2.5 mg SQ weekly May stop Actos after she starts Mounjaro.  She will let me know if coverage is denied.  She will call her insurance to see if they will cover Trulicity or Ozempic if Mounjaro denied.   2. Other depression/emotional eating Deshanti has had issues with stress/emotional eating and depression.  She has been snacking most of the afternoon and evening rather than eating meals. She struggles with grief from the death of her mother and also her husband.  Her husband passed after 50+ years of marriage in October 2022.  She has been meeting with a grief counselor and will attend her last session next month. She also meets with a group of older women church that are great support for her. Overall mood is stable. Medication(s): lexapro 20 mg daily Feels the Lexapro has been a great help.  Plan: Continue and refill Other: Lexapro 20 mg daily. I offered a referral to counseling and she will consider it.    3. Morbid Obesity: Current BMI 35 K[place]ia is currently in the action stage of change. As such, her  goal is to continue with weight loss efforts.  She has agreed to the Category 2 plan.  1.  She will try the special K protein cereal with milk for breakfast. 2.  She will have sandwich with 4 ounces of meat at lunch.  If sandwich is too much she will cut in half and eat the rest later. 3.  Encouraged her to cook once weekly and have leftovers.  May also have frozen meals for dinner-up to 400 cal if the protein is high.  Exercise goals: No exercise has been prescribed at this time.  Behavioral  modification strategies: increasing lean protein intake, decreasing simple carbohydrates , no meal skipping, meal planning , and planning for success.  Itzela has agreed to follow-up with our clinic in 4 weeks.   No orders of the defined types were placed in this encounter.   Medications Discontinued During This Encounter  Medication Reason   escitalopram (LEXAPRO) 20 MG tablet Reorder     Meds ordered this encounter  Medications   tirzepatide (MOUNJARO) 2.5 MG/0.5ML Pen    Sig: Inject 2.5 mg into the skin once a week.    Dispense:  2 mL    Refill:  0    Order Specific Question:   Supervising Provider    Answer:   Glennis Brink [[id number]]   escitalopram (LEXAPRO) 20 MG tablet    Sig: Take 1 tablet (20 mg total) by mouth daily.    Dispense:  30 tablet    Refill:  0    PLEASE DISREGARD PREVIOUS PRESCRIPTION FOR 10 MG. DOSE INCREASE.    Order Specific Question:   Supervising Provider    Answer:   Glennis Brink [[id number]]      Objective:   VITALS: Per patient if applicable, see vitals. GENERAL: Alert and in no acute distress. CARDIOPULMONARY: No increased WOB. Speaking in clear sentences.  PSYCH: Pleasant and cooperative. Speech normal rate and rhythm. Affect is appropriate. Insight and judgement are appropriate. Attention is focused, linear, and appropriate.  NEURO: Oriented as arrived to appointment on time with no prompting.   Attestation Statements:   Reviewed by clinician on day of visit: allergies, medications, problem list, medical history, surgical history, family history, social history, and previous encounter notes.  This was prepared with the assistance of Engineer, civil (consulting).  Occasional wrong-word or sound-a-like substitutions may have occurred due to the inherent limitations of voice recognition software.

## 2023-03-16 ENCOUNTER — Telehealth: Payer: Self-pay

## 2023-03-16 ENCOUNTER — Telehealth (INDEPENDENT_AMBULATORY_CARE_PROVIDER_SITE_OTHER): Payer: PPO | Admitting: Family Medicine

## 2023-03-16 ENCOUNTER — Encounter (INDEPENDENT_AMBULATORY_CARE_PROVIDER_SITE_OTHER): Payer: Self-pay | Admitting: Family Medicine

## 2023-03-16 DIAGNOSIS — E1169 Type 2 diabetes mellitus with other specified complication: Secondary | ICD-10-CM | POA: Diagnosis not present

## 2023-03-16 DIAGNOSIS — E669 Obesity, unspecified: Secondary | ICD-10-CM

## 2023-03-16 DIAGNOSIS — Z6835 Body mass index (BMI) 35.0-35.9, adult: Secondary | ICD-10-CM | POA: Diagnosis not present

## 2023-03-16 DIAGNOSIS — F3289 Other specified depressive episodes: Secondary | ICD-10-CM | POA: Diagnosis not present

## 2023-03-16 DIAGNOSIS — F32A Depression, unspecified: Secondary | ICD-10-CM | POA: Insufficient documentation

## 2023-03-16 DIAGNOSIS — Z7985 Long-term (current) use of injectable non-insulin antidiabetic drugs: Secondary | ICD-10-CM | POA: Diagnosis not present

## 2023-03-16 MED ORDER — ESCITALOPRAM OXALATE 20 MG PO TABS: 20.0000 mg | ORAL_TABLET | Freq: Every day | ORAL | 0 refills | Status: DC

## 2023-03-16 MED ORDER — TIRZEPATIDE 2.5 MG/0.5ML ~~LOC~~ SOAJ: 2.5000 mg | SUBCUTANEOUS | 0 refills | Status: DC

## 2023-03-16 NOTE — Telephone Encounter (Signed)
PA submitted through Cover My Meds for [facility]. Awaiting insurance determination. Key: [id number]

## 2023-03-17 ENCOUNTER — Encounter (INDEPENDENT_AMBULATORY_CARE_PROVIDER_SITE_OTHER): Payer: Self-pay | Admitting: Family Medicine

## 2023-03-17 DIAGNOSIS — E1169 Type 2 diabetes mellitus with other specified complication: Secondary | ICD-10-CM

## 2023-03-18 DIAGNOSIS — L821 Other seborrheic keratosis: Secondary | ICD-10-CM | POA: Diagnosis not present

## 2023-03-21 ENCOUNTER — Ambulatory Visit (INDEPENDENT_AMBULATORY_CARE_PROVIDER_SITE_OTHER): Payer: PPO | Admitting: Orthopaedic Surgery

## 2023-03-21 ENCOUNTER — Encounter: Payer: Self-pay | Admitting: Orthopaedic Surgery

## 2023-03-21 ENCOUNTER — Other Ambulatory Visit: Payer: Self-pay

## 2023-03-21 DIAGNOSIS — Z96651 Presence of right artificial knee joint: Secondary | ICD-10-CM | POA: Diagnosis not present

## 2023-03-21 NOTE — Progress Notes (Signed)
The patient is a 77 year old female well-known to me.  We replaced her right knee in October of last year and her left knee in 2019.  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  She is a patient with Dr. Vear Clock and then pain control and he recently x-rayed her pelvis and her right hip due to continued hip and groin pain.  Some of her pain was around the IT band and the lateral aspect of her hip but some was in the groin area.  She does have a complex spine history with no spine surgery in the past and she has a old CT myelogram of her lumbar spine the Dr. Otelia Sergeant did back in 2016.  He is the one who sent her to Dr. Vear Clock.  Her joint replacements have all done well but she has had some pain again around her right IT band but some in her right hip and groin area.  I was able to review those x-rays and I did not see any complicating features of her right hip replacement.  When I do put her right hip through internal and external rotation she does exhibit some pain in her groin.  Of note both her knees move great and her more recent right operative knee from October of last year shows minimal swelling and excellent range of motion and feels stable.  She is walking without an assistive device as well.  She to me appears younger than 1.  I would like to send her to my partner Dr. Shon Baton for him to consider a right hip injection around the iliopsoas tendon to see how this helps with the pain symptoms that she is having.  This could potentially be diagnostic and therapeutic and she would definitely like to try this before considering any other imaging studies and I agree with this as well given her severe claustrophobia and the need likely for general anesthesia for any type of MRI.  Once Dr. Shon Baton sees her and provides an injection and evaluation, he would then send her back to me about 2 weeks later.  She agrees with this treatment plan.  All question concerns were addressed and answered.

## 2023-03-22 NOTE — Telephone Encounter (Signed)
Forms received from [employer]. Forms printed, filled out and faxed back along with notes and labs to [phone].

## 2023-03-23 DIAGNOSIS — E1169 Type 2 diabetes mellitus with other specified complication: Secondary | ICD-10-CM | POA: Diagnosis not present

## 2023-03-24 ENCOUNTER — Ambulatory Visit: Payer: PPO | Admitting: Sports Medicine

## 2023-03-24 LAB — HEMOGLOBIN A1C
Est. average glucose Bld gHb Est-mCnc: 140 mg/dL
Hgb A1c MFr Bld: 6.5 % — ABNORMAL HIGH (ref 4.8–5.6)

## 2023-03-29 ENCOUNTER — Encounter: Payer: Self-pay | Admitting: Sports Medicine

## 2023-03-29 ENCOUNTER — Other Ambulatory Visit: Payer: Self-pay

## 2023-03-29 ENCOUNTER — Ambulatory Visit (INDEPENDENT_AMBULATORY_CARE_PROVIDER_SITE_OTHER): Payer: PPO | Admitting: Sports Medicine

## 2023-03-29 DIAGNOSIS — M7071 Other bursitis of hip, right hip: Secondary | ICD-10-CM

## 2023-03-29 DIAGNOSIS — M25551 Pain in right hip: Secondary | ICD-10-CM | POA: Diagnosis not present

## 2023-03-29 DIAGNOSIS — Z96641 Presence of right artificial hip joint: Secondary | ICD-10-CM

## 2023-03-29 NOTE — Progress Notes (Signed)
Lauren [name] - 77 y.o. female MRN [id number]  Date of birth: 17-Jul-1946  Office Visit Note: Visit Date: 03/29/2023 PCP: Daisy Floro, MD Referred by: Daisy Floro, MD  Subjective: Chief Complaint  Patient presents with   Right Hip - Pain   HPI: Lauren [name] is a pleasant 77 y.o. female who presents today for evaluation of right hip pain in the setting of prior R-THA.  Had a right hip replacement back in 2018 with Dr. Magnus Ivan, she had been doing well with that. Having pain over the anterior aspect of the hip.  Does state that it starts from the posterior and lateral side of the hip and will wrap around into the groin.  Pain with lifting the leg.  Does have a complicated spine history as well.  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  She performed vigorous physical therapy for this in years past without much relief.  The left hip has started to bother her slightly as well, wondering if this is from compensation.  Lab Results  Component Value Date   HGBA1C 6.5 (H) 03/23/2023   Pertinent ROS were reviewed with the patient and found to be negative unless otherwise specified above in HPI.   Assessment & Plan: Visit Diagnoses:  1. Pain in right hip   2. Iliopsoas bursitis of right hip   3. History of right hip replacement    Plan: Discussed with [name] today that her ultrasound does not show any evidence of tearing of her anterior hip musculature.  She does have some mild fluid around the iliopsoas tendon.  Through shared decision making elected to proceed with ultrasound-guided iliopsoas tendon sheath injection.  She also had some pain that stems from the posterior back/buttock wraps around the lateral hip and into the groin.  Curious if this is coming from the back or from the lateral hip abductors.  Did discuss that we could always consider shockwave therapy or dedicated treatments for the lateral hip as  well, but we will have her get back to Dr. Magnus Ivan first to see how she responded from the iliopsoas sheath injection.  Discussed if she gets at least 50% improvement this is likely where her pain may be coming from, if it does not would suggest evaluating the back or even the lateral hip abductors.   Can consider shockwave therapy for the resultant scar tissue from the hip arthroplasty and the lateral hip/IT band in the future if she desires.  Follow-up: Return for F/u with Dr. Magnus Ivan for hip; may see me for IT-band syndrome as desires.   Meds & Orders: No orders of the defined types were placed in this encounter.   Orders Placed This Encounter  Procedures   [place] Extrem Low Right Ltd     Procedures: US-guided Iliopsoas Tendon Sheath Injection, Right Hip: After discussion on risk/benefits/indications, an informed verbal consent was obtained. A timeout was then performed. The patient was lying supine on examination table with the affected leg relaxed in neutral position. The area overlying the groin and psoas tendon was prepped with ChloraPrep and multiple alcohol swabs. The ultrasound probe was placed in an oblique plane parallel to the inguinal ligament and superior to femoral head. The overlying soft tissue was anesthesized with 3cc of lidocaine 1%. Using ultrasound guidance via an in-plane approach, a 22-gauge, 3.5" needle was inserted from a lateral to medial direction into the iliopsoas tendon sheath  between the tendon and ilium. The tendon sheath was then injected with a mixture of 2:2:1cc of lidocaine:bupivicaine:celestone. Appropriate spread of the injectate within the tendon sheath was visualized with ultrasound guidance. Patient tolerated the procedure well without immediate complications.  A Band-Aid was then applied.         Clinical History: No specialty comments available.  She reports that she quit smoking about 40 years ago. Her smoking use included cigarettes. She has a 10.00  pack-year smoking history. She has never used smokeless tobacco.  Recent Labs    05/11/22 0954 07/23/22 0936 03/23/23 1012  HGBA1C 6.1* 6.1* 6.5*    Objective:   Vital Signs: There were no vitals taken for this visit.  Physical Exam  Gen: Well-appearing, in no acute distress; non-toxic CV: Well-perfused. Warm.  Resp: Breathing unlabored on room air; no wheezing. Psych: Fluid speech in conversation; appropriate affect; normal thought process Neuro: Sensation intact throughout. No gross coordination deficits.   Ortho Exam - Right hip: + TTP over iliopsoas and anterior hip flexors.  No overlying redness or swelling.  Hip moves fluidly full flexion and extension.  There is pain with Stinchfield testing and resisted hip flexion.  Imaging: [place] Extrem Low Right Ltd  Result Date: 03/29/2023 Limited MSK [place] of right lower extremity, right anterior hip was performed today. Right iliopsoas tendon was identified with mild degree of hypoechoic fluid surrounding, no discrete tear however. Right ASIS was visualized without cortical regularity, there is proper insertion of the sartorius muscle without abnormality.  AIIS was evaluated in short and long axis without cortical regularity; there is proper insertion of the rectus femoris without evidence of tearing or abnormality.  The area overlying her superficial scar shows scar tissue noted within the subcutaneous and superficial muscle tissue.   Mild iliopsoas bursopathy with resultant scar tissue from prior right hip THA.   Past Medical/Family/Surgical/Social History: Medications & Allergies reviewed per EMR, new medications updated. Patient Active Problem List   Diagnosis Date Noted   Depression 03/16/2023   Allergic rhinitis 12/29/2022   Seasonal affective disorder (HCC) 12/01/2022   BMI 34.0-34.9,adult 12/01/2022   Obesity, Beginning BMI 37.59 12/01/2022   OA (osteoarthritis) of knee 08/03/2022   Status post total right knee replacement  08/03/2022   Stress 07/19/2022   Other constipation 07/19/2022   Atypical chest pain 06/25/2021   Class 2 severe obesity with serious comorbidity and body mass index (BMI) of 37.0 to 37.9 in adult (HCC) 06/03/2021   Vitamin D deficiency 06/03/2021   Other fatigue 04/22/2021   SOB (shortness of breath) on exertion 04/22/2021   Lower extremity edema 03/05/2021   Coronary artery calcification 02/20/2020   Pure hypercholesterolemia 02/20/2020   Unilateral primary osteoarthritis, right knee 01/03/2019   Genetic testing 12/07/2018   Family history of breast cancer    Family history of prostate cancer    Family history of colon cancer    Family history of kidney cancer    Malignant neoplasm of upper-outer quadrant of right breast in female, 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/09/2018   Diabetic neuropathy, type II diabetes mellitus (HCC) 11/09/2018   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/02/2017   Trochanteric bursitis, right hip 05/10/2017   Status post total replacement of right hip 11/26/2016   DDD (degenerative disc disease), 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   Low back pain 04/23/2015   Hiatal hernia 04/13/2013   Diabetes mellitus (HCC) 01/26/2013   Umbilical hernia 07/24/2012   Essential hypertension    Past Medical History:  Diagnosis Date   Anxiety    Arthritis    Back, Hip- right   Atypical chest pain 06/25/2021   Back pain    Cancer (HCC)    right breast   Cataract    Colitis    Coronary artery calcification 02/20/2020   Diabetes mellitus without complication (HCC)    Type II   Edema 03/05/2021   Edema of both lower extremities    Endometriosis    Family history of adverse reaction to anesthesia    Father - N/V - blood pressure; daughter N/V   Family history of breast cancer    Family history of colon cancer    Family history of kidney cancer    Family history of prostate cancer    Fatty liver    GERD (gastroesophageal reflux disease)    History of hiatal hernia     History of kidney stones    History of ulcerative colitis    Hypertension    Insomnia    Joint pain    Kidney problem    Lower extremity edema 03/05/2021   Lumbar disc disease    L4 L5   Neuropathy    Osteoarthritis    Other hyperlipidemia    Palpitations    Personal history of radiation therapy    Pneumonia    1983, 2003   Pure hypercholesterolemia 02/20/2020   PVCs (premature ventricular contractions)    benign   Umbilical hernia    Vitamin D deficiency    Wears glasses    Family History  Problem Relation Age of Onset   Hyperlipidemia Mother    Diabetes Mother    Hypertension Mother    Parkinson's disease Mother    Kidney disease Mother    Kidney disease Father    Stroke Father    Heart failure Father    Hypertension Father    Heart disease Father    Diabetes Father    Cancer Father        colon, prostate, and kidney   Heart disease Maternal Grandmother    Diabetes Maternal Grandmother    Heart disease Maternal Grandfather    Diabetes Maternal Grandfather    Cancer Maternal Grandfather        pt unaware of what kind   Stroke Paternal Grandmother    Stroke Paternal Grandfather    Cancer Maternal Aunt        cervical vs ovarian   Colon cancer Paternal Aunt        dx less than 50   Breast cancer Paternal Aunt        dx in her 65s   Prostate cancer Paternal Uncle    Breast cancer Cousin        pat first cousin, dx over 108   Prostate cancer Other        PGF's father   Past Surgical History:  Procedure Laterality Date   APPENDECTOMY     BREAST BIOPSY  08-13-2009  DR TSUEI   EXCISION LEFT NIPPLE DUCT   BREAST EXCISIONAL BIOPSY Left    x2   BREAST EXCISIONAL BIOPSY Right    BREAST LUMPECTOMY Right 11/16/2018   invasive ductal   CHOLECYSTECTOMY  1992   COLON RESECTION  1986   ULCERATIVE COLITIS   COLON SURGERY     ESOPHAGEAL MANOMETRY N/A 06/11/2013   Procedure: ESOPHAGEAL MANOMETRY (EM);  Surgeon: Petra Kuba, MD;  Location: [facility];  Service:  Endoscopy;  Laterality: N/A;   ESOPHAGOGASTRODUODENOSCOPY (EGD) WITH  PROPOFOL N/A 05/31/2016   Procedure: ESOPHAGOGASTRODUODENOSCOPY (EGD) WITH PROPOFOL;  Surgeon: Vida Rigger, MD;  Location: [facility] ENDOSCOPY;  Service: Endoscopy;  Laterality: N/A;   EXCISION RIGHT BREAST MASS   10-20-2005  DR Theron Arista YOUNG   EYE SURGERY Bilateral 2023   cataract   FRACTURE SURGERY     left arm   kidney stone removed  12/2009   KNEE ARTHROSCOPY Left    MCL   LEFT THUMB CARPOMETACARPAL JOINT SUSPENSIONPLASTY  04-06-2006  DR Mina Marble   LEFT WRIST ARTHROSCOPY W/ DEBRIDEMENT AND REMOVAL CYST  03-26-2004  DR Mina Marble   MYOMECTOMY     RADIOACTIVE SEED GUIDED PARTIAL MASTECTOMY WITH AXILLARY SENTINEL LYMPH NODE BIOPSY Right 11/16/2018   Procedure: RIGHT BREAST RADIOACTIVE SEED GUIDED PARTIAL MASTECTOMY WITH  SENTINEL  NODE BIOPSY;  Surgeon: Abigail Miyamoto, MD;  Location: [facility] OR;  Service: General;  Laterality: Right;   RIGHT COLECTOMY     RIGHT URETEROSCOPIC STONE EXTRACTION  12-11-2009  DR Jonny Ruiz [facility]   TENDON RELEASE     TONSILLECTOMY     TOTAL HIP ARTHROPLASTY Right 11/26/2016   Procedure: RIGHT TOTAL HIP ARTHROPLASTY ANTERIOR APPROACH;  Surgeon: Kathryne Hitch, MD;  Location: [facility];  Service: Orthopedics;  Laterality: Right;   TOTAL KNEE ARTHROPLASTY Left 12/02/2017   Procedure: LEFT TOTAL KNEE ARTHROPLASTY;  Surgeon: Kathryne Hitch, MD;  Location: [facility];  Service: Orthopedics;  Laterality: Left;   TOTAL KNEE ARTHROPLASTY Right 08/03/2022   Procedure: RIGHT TOTAL KNEE ARTHROPLASTY;  Surgeon: Kathryne Hitch, MD;  Location: [facility] OR;  Service: Orthopedics;  Laterality: Right;   WRIST SURGERY     both   Social History   Occupational History   Occupation: Retired  Tobacco Use   Smoking status: Former    Packs/day: 1.00    Years: 10.00    Additional pack years: 0.00    Total pack years: 10.00    Types: Cigarettes    Quit date: 09/22/1982    Years since quitting: 40.5   Smokeless  tobacco: Never   Tobacco comments:    quit 1983  Vaping Use   Vaping Use: Never used  Substance and Sexual Activity   Alcohol use: No   Drug use: No   Sexual activity: Yes    Partners: Male    Birth control/protection: Post-menopausal

## 2023-03-30 NOTE — Telephone Encounter (Signed)
Received fax from [employer] that Lauren [name] has been approved from 03/26/23-03/21/24.

## 2023-04-01 DIAGNOSIS — F339 Major depressive disorder, recurrent, unspecified: Secondary | ICD-10-CM | POA: Diagnosis not present

## 2023-04-01 DIAGNOSIS — E119 Type 2 diabetes mellitus without complications: Secondary | ICD-10-CM | POA: Diagnosis not present

## 2023-04-07 DIAGNOSIS — M47817 Spondylosis without myelopathy or radiculopathy, lumbosacral region: Secondary | ICD-10-CM | POA: Diagnosis not present

## 2023-04-07 DIAGNOSIS — M25552 Pain in left hip: Secondary | ICD-10-CM | POA: Diagnosis not present

## 2023-04-07 DIAGNOSIS — M150 Primary generalized (osteo)arthritis: Secondary | ICD-10-CM | POA: Diagnosis not present

## 2023-04-07 DIAGNOSIS — M25551 Pain in right hip: Secondary | ICD-10-CM | POA: Diagnosis not present

## 2023-04-11 ENCOUNTER — Ambulatory Visit: Payer: PPO | Admitting: Sports Medicine

## 2023-04-11 ENCOUNTER — Encounter: Payer: Self-pay | Admitting: Sports Medicine

## 2023-04-11 DIAGNOSIS — M7631 Iliotibial band syndrome, right leg: Secondary | ICD-10-CM | POA: Diagnosis not present

## 2023-04-11 DIAGNOSIS — M25551 Pain in right hip: Secondary | ICD-10-CM | POA: Diagnosis not present

## 2023-04-11 DIAGNOSIS — M7071 Other bursitis of hip, right hip: Secondary | ICD-10-CM | POA: Diagnosis not present

## 2023-04-11 NOTE — Progress Notes (Signed)
Patient was instructed in 10 minutes of therapeutic exercises for right IT band to improve strength, ROM and function according to my instructions and plan of care by a Certified Athletic Trainer during the office visit. A customized handout was provided and demonstration of proper technique shown and discussed. Patient did perform exercises and demonstrate understanding through teachback.  All questions discussed and answered.

## 2023-04-11 NOTE — Progress Notes (Signed)
Lauren [name] - 77 y.o. female MRN [id number]  Date of birth: 16-May-1946  Office Visit Note: Visit Date: 04/11/2023 PCP: Daisy Floro, MD Referred by: Daisy Floro, MD  Subjective: Chief Complaint  Patient presents with   Right Hip - Pain   HPI: Lauren [name] is a pleasant 77 y.o. female who presents today for right hip and IT-band syndrome.  Performed US-guided iliopsosas tendon sheath injection on 03/29/23 - this gave her excellent relief of her pain, essentially took it away for a week. Now is still better, but starting to creep back up on her. Able to flex the hip now.  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  This has been bothering her ever since she had her knee replacement.  She has done vigorous physical therapy without much relief.  Here for a trial of extracorporeal shockwave therapy.  Pertinent ROS were reviewed with the patient and found to be negative unless otherwise specified above in HPI.   Assessment & Plan: Visit Diagnoses:  1. It band syndrome, right   2. Pain in right hip   3. Iliopsoas bursitis of right hip    Plan: Discussed with [name] that it is reassuring she had essentially complete relief after the ultrasound-guided iliopsoas bursa injection x 1 week, this does point to this being the pathology and not any issues with the right hip replacement/hardware.  She will follow-up with Dr. Magnus Ivan for this as well as having some pain over the left hip that feels similar to her right hip prior to her replacement.  Do think she would benefit from some formalized physical therapy for the anterior hip musculature, will leave up to his discretion.  In terms of her chronic right IT band syndrome, we did proceed with a trial of extracorporeal shockwave therapy.  We did provide some stretching and strengthening for the IT band, my athletic trainer Lequita Halt did review a printed out customized handout for her in the room today.  She  will perform these once daily.  Will follow-up next week for 1 additional use ESWT and if she gets at least 20% improvement after 2 sessions we may consider repeating additional treatments. F/u in 1 week.  Follow-up: Return in about 1 week (around 04/18/2023) for for IT-band syndrome (reg visit).   Meds & Orders: No orders of the defined types were placed in this encounter.  No orders of the defined types were placed in this encounter.    Procedures: Procedure: ECSWT Indications:  IT-band syndrome, GT syndrome   Procedure Details Consent: Risks of procedure as well as the alternatives and risks of each were explained to the patient.  Verbal consent for procedure obtained. Time Out: Verified patient identification, verified procedure, site was marked, verified correct patient position. The area was cleaned with alcohol swab.     The right greater trochanter and IT-band was targeted for Extracorporeal shockwave therapy.    Preset: GT bursitits/IT-band Power Level: 110 mJ Frequency: 11 Hz Impulse/cycles: 3000 Head size: Regular   Patient tolerated procedure well without immediate complications.         Clinical History: No specialty comments available.  She reports that she quit smoking about 40 years ago. Her smoking use included cigarettes. She has a 10.00 pack-year smoking history. She has never used smokeless tobacco.  Recent Labs    05/11/22 0954 07/23/22 0936 03/23/23 1012  HGBA1C 6.1* 6.1* 6.5*    Objective:   Vital Signs: There were  no vitals taken for this visit.  Physical Exam  Gen: Well-appearing, in no acute distress; non-toxic CV: Well-perfused. Warm.  Resp: Breathing unlabored on room air; no wheezing. Psych: Fluid speech in conversation; appropriate affect; normal thought process Neuro: Sensation intact throughout. No gross coordination deficits.   Ortho Exam - Right hip: Improved tenderness over the iliopsoas and anterior hip flexors, she moves the hip  more fluidly with hip flexion and has better strength today with resisted hip flexion although still some pain with Stinchfield testing.  Positive TTP palpating over the greater trochanter in the mid aspect of the lateral IT band.  Imaging: No results found.  Past Medical/Family/Surgical/Social History: Medications & Allergies reviewed per EMR, new medications updated. Patient Active Problem List   Diagnosis Date Noted   Depression 03/16/2023   Allergic rhinitis 12/29/2022   Seasonal affective disorder (HCC) 12/01/2022   BMI 34.0-34.9,adult 12/01/2022   Obesity, Beginning BMI 37.59 12/01/2022   OA (osteoarthritis) of knee 08/03/2022   Status post total right knee replacement 08/03/2022   Stress 07/19/2022   Other constipation 07/19/2022   Atypical chest pain 06/25/2021   Class 2 severe obesity with serious comorbidity and body mass index (BMI) of 37.0 to 37.9 in adult (HCC) 06/03/2021   Vitamin D deficiency 06/03/2021   Other fatigue 04/22/2021   SOB (shortness of breath) on exertion 04/22/2021   Lower extremity edema 03/05/2021   Coronary artery calcification 02/20/2020   Pure hypercholesterolemia 02/20/2020   Unilateral primary osteoarthritis, right knee 01/03/2019   Genetic testing 12/07/2018   Family history of breast cancer    Family history of prostate cancer    Family history of colon cancer    Family history of kidney cancer    Malignant neoplasm of upper-outer quadrant of right breast in female, 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/09/2018   Diabetic neuropathy, type II diabetes mellitus (HCC) 11/09/2018   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/02/2017   Trochanteric bursitis, right hip 05/10/2017   Status post total replacement of right hip 11/26/2016   DDD (degenerative disc disease), 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   Low back pain 04/23/2015   Hiatal hernia 04/13/2013   Diabetes mellitus (HCC) 01/26/2013   Umbilical hernia 07/24/2012   Essential hypertension    Past  Medical History:  Diagnosis Date   Anxiety    Arthritis    Back, Hip- right   Atypical chest pain 06/25/2021   Back pain    Cancer (HCC)    right breast   Cataract    Colitis    Coronary artery calcification 02/20/2020   Diabetes mellitus without complication (HCC)    Type II   Edema 03/05/2021   Edema of both lower extremities    Endometriosis    Family history of adverse reaction to anesthesia    Father - N/V - blood pressure; daughter N/V   Family history of breast cancer    Family history of colon cancer    Family history of kidney cancer    Family history of prostate cancer    Fatty liver    GERD (gastroesophageal reflux disease)    History of hiatal hernia    History of kidney stones    History of ulcerative colitis    Hypertension    Insomnia    Joint pain    Kidney problem    Lower extremity edema 03/05/2021   Lumbar disc disease    L4 L5   Neuropathy    Osteoarthritis    Other hyperlipidemia  Palpitations    Personal history of radiation therapy    Pneumonia    1983, 2003   Pure hypercholesterolemia 02/20/2020   PVCs (premature ventricular contractions)    benign   Umbilical hernia    Vitamin D deficiency    Wears glasses    Family History  Problem Relation Age of Onset   Hyperlipidemia Mother    Diabetes Mother    Hypertension Mother    Parkinson's disease Mother    Kidney disease Mother    Kidney disease Father    Stroke Father    Heart failure Father    Hypertension Father    Heart disease Father    Diabetes Father    Cancer Father        colon, prostate, and kidney   Heart disease Maternal Grandmother    Diabetes Maternal Grandmother    Heart disease Maternal Grandfather    Diabetes Maternal Grandfather    Cancer Maternal Grandfather        pt unaware of what kind   Stroke Paternal Grandmother    Stroke Paternal Grandfather    Cancer Maternal Aunt        cervical vs ovarian   Colon cancer Paternal Aunt        dx less than 50    Breast cancer Paternal Aunt        dx in her 35s   Prostate cancer Paternal Uncle    Breast cancer Cousin        pat first cousin, dx over 28   Prostate cancer Other        PGF's father   Past Surgical History:  Procedure Laterality Date   APPENDECTOMY     BREAST BIOPSY  08-13-2009  DR TSUEI   EXCISION LEFT NIPPLE DUCT   BREAST EXCISIONAL BIOPSY Left    x2   BREAST EXCISIONAL BIOPSY Right    BREAST LUMPECTOMY Right 11/16/2018   invasive ductal   CHOLECYSTECTOMY  1992   COLON RESECTION  1986   ULCERATIVE COLITIS   COLON SURGERY     ESOPHAGEAL MANOMETRY N/A 06/11/2013   Procedure: ESOPHAGEAL MANOMETRY (EM);  Surgeon: Petra Kuba, MD;  Location: [facility];  Service: Endoscopy;  Laterality: N/A;   ESOPHAGOGASTRODUODENOSCOPY (EGD) WITH PROPOFOL N/A 05/31/2016   Procedure: ESOPHAGOGASTRODUODENOSCOPY (EGD) WITH PROPOFOL;  Surgeon: Vida Rigger, MD;  Location[facility] ENDOSCOPY;  Service: Endoscopy;  Laterality: N/A;   EXCISION RIGHT BREAST MASS   10-20-2005  DR Theron Arista YOUNG   EYE SURGERY Bilateral 2023   cataract   FRACTURE SURGERY     left arm   kidney stone removed  12/2009   KNEE ARTHROSCOPY Left    MCL   LEFT THUMB CARPOMETACARPAL JOINT SUSPENSIONPLASTY  04-06-2006  DR Mina Marble   LEFT WRIST ARTHROSCOPY W/ DEBRIDEMENT AND REMOVAL CYST  03-26-2004  DR Mina Marble   MYOMECTOMY     RADIOACTIVE SEED GUIDED PARTIAL MASTECTOMY WITH AXILLARY SENTINEL LYMPH NODE BIOPSY Right 11/16/2018   Procedure: RIGHT BREAST RADIOACTIVE SEED GUIDED PARTIAL MASTECTOMY WITH  SENTINEL  NODE BIOPSY;  Surgeon: Abigail Miyamoto, MD;  Location: [facility] OR;  Service: General;  Laterality: Right;   RIGHT COLECTOMY     RIGHT URETEROSCOPIC STONE EXTRACTION  12-11-2009  DR Jonny Ruiz [facility]   TENDON RELEASE     TONSILLECTOMY     TOTAL HIP ARTHROPLASTY Right 11/26/2016   Procedure: RIGHT TOTAL HIP ARTHROPLASTY ANTERIOR APPROACH;  Surgeon: Kathryne Hitch, MD;  Location: [facility] ORS;  Service: Orthopedics;  Laterality: Right;  TOTAL KNEE ARTHROPLASTY Left 12/02/2017   Procedure: LEFT TOTAL KNEE ARTHROPLASTY;  Surgeon: Kathryne Hitch, MD;  Location: [facility];  Service: Orthopedics;  Laterality: Left;   TOTAL KNEE ARTHROPLASTY Right 08/03/2022   Procedure: RIGHT TOTAL KNEE ARTHROPLASTY;  Surgeon: Kathryne Hitch, MD;  Location: [facility] OR;  Service: Orthopedics;  Laterality: Right;   WRIST SURGERY     both   Social History   Occupational History   Occupation: Retired  Tobacco Use   Smoking status: Former    Packs/day: 1.00    Years: 10.00    Additional pack years: 0.00    Total pack years: 10.00    Types: Cigarettes    Quit date: 09/22/1982    Years since quitting: 40.5   Smokeless tobacco: Never   Tobacco comments:    quit 1983  Vaping Use   Vaping Use: Never used  Substance and Sexual Activity   Alcohol use: No   Drug use: No   Sexual activity: Yes    Partners: Male    Birth control/protection: Post-menopausal

## 2023-04-13 ENCOUNTER — Encounter (INDEPENDENT_AMBULATORY_CARE_PROVIDER_SITE_OTHER): Payer: Self-pay | Admitting: Family Medicine

## 2023-04-13 ENCOUNTER — Ambulatory Visit (INDEPENDENT_AMBULATORY_CARE_PROVIDER_SITE_OTHER): Payer: PPO | Admitting: Family Medicine

## 2023-04-13 VITALS — BP 124/72 | HR 63 | Temp 97.5°F | Ht 64.0 in | Wt 210.0 lb

## 2023-04-13 DIAGNOSIS — Z7985 Long-term (current) use of injectable non-insulin antidiabetic drugs: Secondary | ICD-10-CM

## 2023-04-13 DIAGNOSIS — E1169 Type 2 diabetes mellitus with other specified complication: Secondary | ICD-10-CM

## 2023-04-13 DIAGNOSIS — E559 Vitamin D deficiency, unspecified: Secondary | ICD-10-CM

## 2023-04-13 DIAGNOSIS — E669 Obesity, unspecified: Secondary | ICD-10-CM | POA: Diagnosis not present

## 2023-04-13 DIAGNOSIS — F3289 Other specified depressive episodes: Secondary | ICD-10-CM

## 2023-04-13 DIAGNOSIS — Z6836 Body mass index (BMI) 36.0-36.9, adult: Secondary | ICD-10-CM

## 2023-04-13 DIAGNOSIS — Z6835 Body mass index (BMI) 35.0-35.9, adult: Secondary | ICD-10-CM

## 2023-04-14 ENCOUNTER — Encounter (INDEPENDENT_AMBULATORY_CARE_PROVIDER_SITE_OTHER): Payer: Self-pay | Admitting: Family Medicine

## 2023-04-14 ENCOUNTER — Ambulatory Visit
Admission: RE | Admit: 2023-04-14 | Discharge: 2023-04-14 | Disposition: A | Discharge status: Home / Self Care | Payer: PPO | Source: Ambulatory Visit | Attending: Family Medicine | Admitting: Family Medicine

## 2023-04-14 DIAGNOSIS — N951 Menopausal and female climacteric states: Secondary | ICD-10-CM | POA: Diagnosis not present

## 2023-04-14 DIAGNOSIS — Z8262 Family history of osteoporosis: Secondary | ICD-10-CM | POA: Diagnosis not present

## 2023-04-14 DIAGNOSIS — E349 Endocrine disorder, unspecified: Secondary | ICD-10-CM | POA: Diagnosis not present

## 2023-04-14 DIAGNOSIS — M810 Age-related osteoporosis without current pathological fracture: Secondary | ICD-10-CM | POA: Diagnosis not present

## 2023-04-14 DIAGNOSIS — E2839 Other primary ovarian failure: Secondary | ICD-10-CM

## 2023-04-14 MED ORDER — VITAMIN D (ERGOCALCIFEROL) 1.25 MG (50000 UNIT) PO CAPS
50000.0000 [IU] | ORAL_CAPSULE | ORAL | 0 refills | Status: DC
Start: 2023-04-14 — End: 2023-05-11

## 2023-04-14 MED ORDER — TIRZEPATIDE 2.5 MG/0.5ML ~~LOC~~ SOAJ
2.5000 mg | SUBCUTANEOUS | 0 refills | Status: DC
Start: 2023-04-14 — End: 2023-05-11

## 2023-04-14 MED ORDER — ESCITALOPRAM OXALATE 20 MG PO TABS: 20.0000 mg | ORAL_TABLET | Freq: Every day | ORAL | 0 refills | Status: DC

## 2023-04-14 NOTE — Addendum Note (Signed)
Addended by: [name] D on: 04/14/2023 11:33 AM   Modules accepted: Level of Service

## 2023-04-14 NOTE — Progress Notes (Signed)
.smr  Office: [phone]  /  Fax: [phone]  WEIGHT SUMMARY AND BIOMETRICS  Anthropometric Measurements Height: 5\' 4"  (1.626 m) Weight: 210 lb (95.3 kg) BMI (Calculated): 36.03 Weight at Last Visit: 205 lb (Last in office visit 01/27/2023) Weight Lost Since Last Visit: 0 Weight Gained Since Last Visit: 5 lb   Body Composition  Body Fat %: 49.9 % Fat Mass (lbs): 105.2 lbs Muscle Mass (lbs): 100.2 lbs Total Body Water (lbs): 77.8 lbs Visceral Fat Rating : 16   Other Clinical Data Fasting: No Labs: No Today's Visit #: 25    Chief Complaint: OBESITY  Lauren [name] is here to discuss her progress with her obesity treatment plan. She is on the the Category 2 Plan and states she is following her eating plan approximately 25 % of the time. She states she is exercising 0 minutes 0 times per week.  Discussed the use of AI scribe software for clinical note transcription with the patient, who gave verbal consent to proceed.  History of Present Illness   The patient is a 77 year old individual with a history of vitamin D deficiency, type 2 diabetes, and obesity. Over the past three months, the patient has gained five pounds and has been experiencing health challenges. She is currently undergoing treatment for IT band syndrome and has been dealing with fluid build-up, similar to bursitis. The patient also reports pain in the iliosacral region, for which she received a lidocaine and steroid injection.  The patient has been experiencing instability and has resorted to using a cane for support. The discomfort is bilateral, but was initially worse on the right side. She reports difficulty with weight-bearing on the left side, particularly when climbing stairs or getting into a vehicle.  The patient underwent knee surgery on the right side in October and has since been unable to rely on the left side for support. She reports feeling unwell and has been experiencing sleep disturbances due to  pain.  The patient was recently started on [facility] for diabetes management and has noticed a decrease in appetite and a slight weight loss. However, she has not noticed a significant change in her blood sugar levels. The patient was previously on Actos, but it was discontinued due to low blood pressure.  The patient also reports some mood changes, which she attributes to her physical health status. She has been trying to stay busy and has been engaging in activities such as reading, painting, and gardening. The patient is also dealing with the final phases of closing out her husband's estate.  The patient is on Lexapro for mood management and takes vitamin D supplements once a week. She also takes a fiber gummy daily for digestive health. The patient has been prescribed Mounjaro for diabetes management and has noticed a decrease in appetite since starting the medication. She has also noticed an increase in urination frequency.          PHYSICAL EXAM:  Blood pressure 124/72, pulse 63, temperature (!) 97.5 F (36.4 C), height 5\' 4"  (1.626 m), weight 210 lb (95.3 kg), SpO2 95 %. Body mass index is 36.05 kg/m.  DIAGNOSTIC DATA REVIEWED:  BMET    Component Value Date/Time   NA 135 08/04/2022 0331   NA 135 05/11/2022 0954   K 3.8 08/04/2022 0331   CL 101 08/04/2022 0331   CO2 24 08/04/2022 0331   GLUCOSE 127 (H) 08/04/2022 0331   BUN 16 08/04/2022 0331   BUN 17 05/11/2022 0954   CREATININE 1.11 (H)  08/04/2022 0331   CREATININE 0.87 06/08/2022 1439   CALCIUM 8.6 (L) 08/04/2022 0331   GFRNONAA 52 (L) 08/04/2022 0331   GFRNONAA >60 06/08/2022 1439   GFRAA >60 02/04/2020 1212   GFRAA >60 11/09/2018 1405   Lab Results  Component Value Date   HGBA1C 6.5 (H) 03/23/2023   HGBA1C 6.7 (H) 11/16/2016   Lab Results  Component Value Date   INSULIN 11.1 05/11/2022   INSULIN 22.3 04/22/2021   Lab Results  Component Value Date   TSH 2.150 05/11/2022   CBC    Component Value  Date/Time   WBC 7.8 08/04/2022 0331   RBC 3.14 (L) 08/04/2022 0331   HGB 9.5 (L) 08/04/2022 0331   HGB 12.3 06/08/2022 1439   HGB 11.8 05/11/2022 0954   HCT 27.9 (L) 08/04/2022 0331   HCT 35.1 05/11/2022 0954   PLT 205 08/04/2022 0331   PLT 272 06/08/2022 1439   PLT 265 05/11/2022 0954   MCV 88.9 08/04/2022 0331   MCV 87 05/11/2022 0954   MCH 30.3 08/04/2022 0331   MCHC 34.1 08/04/2022 0331   RDW 13.6 08/04/2022 0331   RDW 13.2 05/11/2022 0954   Iron Studies No results found for: "IRON", "TIBC", "FERRITIN", "IRONPCTSAT" Lipid Panel     Component Value Date/Time   CHOL 128 05/11/2022 0954   TRIG 83 05/11/2022 0954   HDL 56 05/11/2022 0954   LDLCALC 56 05/11/2022 0954   Hepatic Function Panel     Component Value Date/Time   PROT 7.5 06/08/2022 1439   PROT 7.0 05/11/2022 0954   ALBUMIN 4.4 06/08/2022 1439   ALBUMIN 4.4 05/11/2022 0954   AST 16 06/08/2022 1439   ALT 12 06/08/2022 1439   ALKPHOS 50 06/08/2022 1439   BILITOT 0.4 06/08/2022 1439   BILIDIR <0.1 (L) 07/30/2015 1338   IBILI NOT CALCULATED 07/30/2015 1338      Component Value Date/Time   TSH 2.150 05/11/2022 0954   Nutritional Lab Results  Component Value Date   VD25OH 55.8 12/01/2022   VD25OH 69.3 05/11/2022   VD25OH 86.9 12/10/2021     Assessment and Plan    Musculoskeletal Pain: Patient reports pain in the ileosacral region, currently undergoing treatment with ionotoxicleresis and steroid injections. Pain has resulted in decreased mobility and reliance on a cane. -Continue current treatment plan with home exercises as directed by physical therapist. -Evaluate effectiveness of treatment after two weeks.  Type 2 Diabetes: Patient recently started on Mounjaro 2.5mg  weekly, reports decreased appetite and some mild gastrointestinal side effects. -Continue Mounjaro 2.5mg  weekly. -Check blood glucose levels regularly to monitor effectiveness of medication.  Obesity: Patient has gained 5 pounds in the  past three months, likely due to decreased mobility from musculoskeletal pain. -Encourage patient to maintain a diet high in protein to prevent muscle loss. -Consider protein shakes if necessary to meet protein intake goals.  Vitamin D Deficiency: Patient is currently on weekly Vitamin D supplementation. -Continue Vitamin D supplementation as prescribed.  Depression: Patient is currently on Lexapro 20mg , reports some mood changes related to physical health but no significant depressive symptoms. -Continue Lexapro 20mg  daily.  General Health Maintenance / Followup Plans -Refill prescriptions for Mounjaro, Lexapro, and Vitamin D. -Schedule follow-up appointment for August after next appointment with [facility] in July. -Encourage patient to maintain a regular eating schedule to manage Mounjaro side effects. -Advise patient to monitor for any changes in mood or depressive symptoms.         I have personally spent 43  minutes total time today in preparation, patient care, and documentation for this visit, including the following: review of clinical lab tests; review of medical tests/procedures/services.    She was informed of the importance of frequent follow up visits to maximize her success with intensive lifestyle modifications for her multiple health conditions. Return in about 4 weeks (around 05/11/2023).   [name], MD

## 2023-04-18 ENCOUNTER — Other Ambulatory Visit: Payer: Self-pay

## 2023-04-18 ENCOUNTER — Ambulatory Visit: Payer: PPO | Admitting: Orthopaedic Surgery

## 2023-04-18 ENCOUNTER — Encounter: Payer: Self-pay | Admitting: Orthopaedic Surgery

## 2023-04-18 DIAGNOSIS — Z96651 Presence of right artificial knee joint: Secondary | ICD-10-CM | POA: Diagnosis not present

## 2023-04-18 DIAGNOSIS — M7631 Iliotibial band syndrome, right leg: Secondary | ICD-10-CM

## 2023-04-18 DIAGNOSIS — Z96641 Presence of right artificial hip joint: Secondary | ICD-10-CM

## 2023-04-18 DIAGNOSIS — M25552 Pain in left hip: Secondary | ICD-10-CM

## 2023-04-18 DIAGNOSIS — M7071 Other bursitis of hip, right hip: Secondary | ICD-10-CM

## 2023-04-18 NOTE — Progress Notes (Signed)
The patient comes in today with continued right hip pain and certainly worsening left hip pain.  She fortunately saw my partner Dr. Shon Baton who was able to provide some injections around her right hip and some of this has helped but she still having significant issues around her hips with getting comfort at night.  She does not get good rest.  She has been to physical therapy for her knees and they did some dry needling as a relates to her right hip.  We replaced her right hip back in 2019 and recent x-rays showed normal-appearing hip replacement on the right side.  Both hips move smoothly and fluidly but there certainly having pain over the trochanteric area on both hips that is quite significant.  She has now failed all forms conservative treatment.  I would like to obtain an MRI of both hips to assess the cartilage of the left hip and the tendons around the left hip trochanteric area but also would like to see what her right hip looks like as well given the pain that she continues to have.  She agrees with taking the step.

## 2023-04-19 ENCOUNTER — Encounter: Payer: Self-pay | Admitting: Sports Medicine

## 2023-04-19 ENCOUNTER — Ambulatory Visit: Payer: PPO | Admitting: Sports Medicine

## 2023-04-19 DIAGNOSIS — M7061 Trochanteric bursitis, right hip: Secondary | ICD-10-CM | POA: Diagnosis not present

## 2023-04-19 DIAGNOSIS — M7631 Iliotibial band syndrome, right leg: Secondary | ICD-10-CM

## 2023-04-19 DIAGNOSIS — M25551 Pain in right hip: Secondary | ICD-10-CM | POA: Diagnosis not present

## 2023-04-19 DIAGNOSIS — Z96641 Presence of right artificial hip joint: Secondary | ICD-10-CM

## 2023-04-19 NOTE — Progress Notes (Signed)
Lauren [name] - 77 y.o. female MRN [id number]  Date of birth: [birth year]  Office Visit Note: Visit Date: 04/19/2023 PCP: Daisy Floro, MD Referred by: Daisy Floro, MD  Subjective: Chief Complaint  Patient presents with   Right Leg - Follow-up   HPI: Lauren [name] is a pleasant 77 y.o. female who presents today for follow-up of anterior lateral right hip pain and IT band syndrome.  She did see Dr. Magnus Ivan for her hips, did order MRIs of both hips to evaluate further.  In terms of her lateral hip pain and right IT band syndrome, she does think she got at least 30 to 40% improvement after the first shockwave treatment, is interested in repeating today.  At this point her ultrasound-guided iliopsoas tendon sheath injection has largely wore off although she got nearly 100% relief of her pain for the first 1 to 2 weeks.  Pertinent ROS were reviewed with the patient and found to be negative unless otherwise specified above in HPI.   Assessment & Plan: Visit Diagnoses:  1. It band syndrome, right   2. Pain in right hip   3. History of right hip replacement   4. Trochanteric bursitis, right hip    Plan: Discussed with [name] that we both are pleased she had good relief from the first trial of extracorporeal shockwave therapy for the lateral hip and IT band, through shared decision making we did repeat this treatment today.  We did perform a trial of this over the anterior hip flexors and iliopsoas as well to see if this would help relieve her pain.  Will follow-up next week and let me know what sort of improvement she received that both of these locations.  She will continue her home IT band stretching.  May continue over-the-counter anti-inflammatory use only as needed.  She will get her MRIs of the hips and between Dr. Magnus Ivan and myself we will decide on future treatments.  Follow-up in 1 week.  Follow-up: Return in about 1 week (around 04/26/2023) for for  ant--lateral R-hip (reg visit).   Meds & Orders: No orders of the defined types were placed in this encounter.  No orders of the defined types were placed in this encounter.    Procedures:  Procedure: ECSWT Indications:  IT-band syndrome, GT syndrome, and iliopsoas tendinopathy/bursitis   Procedure Details Consent: Risks of procedure as well as the alternatives and risks of each were explained to the patient.  Verbal consent for procedure obtained. Time Out: Verified patient identification, verified procedure, site was marked, verified correct patient position. The area was cleaned with alcohol swab.     The right greater trochanter and IT-band was targeted for Extracorporeal shockwave therapy.    Preset: GT bursitits/IT-band Power Level: 120 mJ Frequency: 12 Hz Impulse/cycles: 2500 Head size: Regular   Patient tolerated procedure well without immediate complications.  The right iliopsoas and anterior hip flexors was targeted for Extracorporeal shockwave therapy.    Preset: Muscle Injury Power Level: 110 mJ Frequency: 12 Hz Impulse/cycles: 2000 Head size: Regular   Patient tolerated procedure well without immediate complications.     Clinical History: No specialty comments available.  She reports that she quit smoking about 40 years ago. Her smoking use included cigarettes. She has a 10.00 pack-year smoking history. She has never used smokeless tobacco.  Recent Labs    05/11/22 0954 07/23/22 0936 03/23/23 1012  HGBA1C 6.1* 6.1* 6.5*    Objective:    Physical Exam  Gen: Well-appearing, in no acute distress; non-toxic CV: Well-perfused. Warm.  Resp: Breathing unlabored on room air; no wheezing. Psych: Fluid speech in conversation; appropriate affect; normal thought process Neuro: Sensation intact throughout. No gross coordination deficits.   Ortho Exam - Right hip: + Mild TTP over the greater trochanter, more so over the middle of the IT band.  There is some pain  palpating over the iliopsoas and anterior hip flexors, although the hip moves fluidly with hip flexion and this is less painful with her today.  Imaging: No results found.  Past Medical/Family/Surgical/Social History: Medications & Allergies reviewed per EMR, new medications updated. Patient Active Problem List   Diagnosis Date Noted   Depression 03/16/2023   Allergic rhinitis 12/29/2022   Seasonal affective disorder (HCC) 12/01/2022   BMI 34.0-34.9,adult 12/01/2022   Obesity, Beginning BMI 37.59 12/01/2022   OA (osteoarthritis) of knee 08/03/2022   Status post total right knee replacement 08/03/2022   Stress 07/19/2022   Other constipation 07/19/2022   Atypical chest pain 06/25/2021   Class 2 severe obesity with serious comorbidity and body mass index (BMI) of 37.0 to 37.9 in adult (HCC) 06/03/2021   Vitamin D deficiency 06/03/2021   Other fatigue 04/22/2021   SOB (shortness of breath) on exertion 04/22/2021   Lower extremity edema 03/05/2021   Coronary artery calcification 02/20/2020   Pure hypercholesterolemia 02/20/2020   Unilateral primary osteoarthritis, right knee 01/03/2019   Genetic testing 12/07/2018   Family history of breast cancer    Family history of prostate cancer    Family history of colon cancer    Family history of kidney cancer    Malignant neoplasm of upper-outer quadrant of right breast in female, 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/09/2018   Diabetic neuropathy, type II diabetes mellitus (HCC) 11/09/2018   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/02/2017   Trochanteric bursitis, right hip 05/10/2017   Status post total replacement of right hip 11/26/2016   DDD (degenerative disc disease), 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   Low back pain 04/23/2015   Hiatal hernia 04/13/2013   Diabetes mellitus (HCC) 01/26/2013   Umbilical hernia 07/24/2012   Essential hypertension    Past Medical History:  Diagnosis Date   Anxiety    Arthritis    Back, Hip- right    Atypical chest pain 06/25/2021   Back pain    Cancer (HCC)    right breast   Cataract    Colitis    Coronary artery calcification 02/20/2020   Diabetes mellitus without complication (HCC)    Type II   Edema 03/05/2021   Edema of both lower extremities    Endometriosis    Family history of adverse reaction to anesthesia    Father - N/V - blood pressure; daughter N/V   Family history of breast cancer    Family history of colon cancer    Family history of kidney cancer    Family history of prostate cancer    Fatty liver    GERD (gastroesophageal reflux disease)    History of hiatal hernia    History of kidney stones    History of ulcerative colitis    Hypertension    Insomnia    Joint pain    Kidney problem    Lower extremity edema 03/05/2021   Lumbar disc disease    L4 L5   Neuropathy    Osteoarthritis    Other hyperlipidemia    Palpitations    Personal history of radiation therapy    Pneumonia  1983, 2003   Pure hypercholesterolemia 02/20/2020   PVCs (premature ventricular contractions)    benign   Umbilical hernia    Vitamin D deficiency    Wears glasses    Family History  Problem Relation Age of Onset   Hyperlipidemia Mother    Diabetes Mother    Hypertension Mother    Parkinson's disease Mother    Kidney disease Mother    Kidney disease Father    Stroke Father    Heart failure Father    Hypertension Father    Heart disease Father    Diabetes Father    Cancer Father        colon, prostate, and kidney   Heart disease Maternal Grandmother    Diabetes Maternal Grandmother    Heart disease Maternal Grandfather    Diabetes Maternal Grandfather    Cancer Maternal Grandfather        pt unaware of what kind   Stroke Paternal Grandmother    Stroke Paternal Grandfather    Cancer Maternal Aunt        cervical vs ovarian   Colon cancer Paternal Aunt        dx less than 50   Breast cancer Paternal Aunt        dx in her 46s   Prostate cancer Paternal  Uncle    Breast cancer Cousin        pat first cousin, dx over 45   Prostate cancer Other        PGF's father   Past Surgical History:  Procedure Laterality Date   APPENDECTOMY     BREAST BIOPSY  08-13-2009  DR TSUEI   EXCISION LEFT NIPPLE DUCT   BREAST EXCISIONAL BIOPSY Left    x2   BREAST EXCISIONAL BIOPSY Right    BREAST LUMPECTOMY Right 11/16/2018   invasive ductal   CHOLECYSTECTOMY  1992   COLON RESECTION  1986   ULCERATIVE COLITIS   COLON SURGERY     ESOPHAGEAL MANOMETRY N/A 06/11/2013   Procedure: ESOPHAGEAL MANOMETRY (EM);  Surgeon: Petra Kuba, MD;  Location: [facility];  Service: Endoscopy;  Laterality: N/A;   ESOPHAGOGASTRODUODENOSCOPY (EGD) WITH PROPOFOL N/A 05/31/2016   Procedure: ESOPHAGOGASTRODUODENOSCOPY (EGD) WITH PROPOFOL;  Surgeon: Vida Rigger, MD;  Location: [facility] ENDOSCOPY;  Service: Endoscopy;  Laterality: N/A;   EXCISION RIGHT BREAST MASS   10-20-2005  DR Theron Arista YOUNG   EYE SURGERY Bilateral 2023   cataract   FRACTURE SURGERY     left arm   kidney stone removed  12/2009   KNEE ARTHROSCOPY Left    MCL   LEFT THUMB CARPOMETACARPAL JOINT SUSPENSIONPLASTY  04-06-2006  DR Mina Marble   LEFT WRIST ARTHROSCOPY W/ DEBRIDEMENT AND REMOVAL CYST  03-26-2004  DR Mina Marble   MYOMECTOMY     RADIOACTIVE SEED GUIDED PARTIAL MASTECTOMY WITH AXILLARY SENTINEL LYMPH NODE BIOPSY Right 11/16/2018   Procedure: RIGHT BREAST RADIOACTIVE SEED GUIDED PARTIAL MASTECTOMY WITH  SENTINEL  NODE BIOPSY;  Surgeon: Abigail Miyamoto, MD;  Location: [facility] OR;  Service: General;  Laterality: Right;   RIGHT COLECTOMY     RIGHT URETEROSCOPIC STONE EXTRACTION  12-11-2009  DR Jonny Ruiz [facility]   TENDON RELEASE     TONSILLECTOMY     TOTAL HIP ARTHROPLASTY Right 11/26/2016   Procedure: RIGHT TOTAL HIP ARTHROPLASTY ANTERIOR APPROACH;  Surgeon: Kathryne Hitch, MD;  Location: [facility] ORS;  Service: Orthopedics;  Laterality: Right;   TOTAL KNEE ARTHROPLASTY Left 12/02/2017   Procedure: LEFT TOTAL KNEE  ARTHROPLASTY;  Surgeon: Kathryne Hitch, MD;  Location: [facility] ORS;  Service: Orthopedics;  Laterality: Left;   TOTAL KNEE ARTHROPLASTY Right 08/03/2022   Procedure: RIGHT TOTAL KNEE ARTHROPLASTY;  Surgeon: Kathryne Hitch, MD;  Location: [facility] OR;  Service: Orthopedics;  Laterality: Right;   WRIST SURGERY     both   Social History   Occupational History   Occupation: Retired  Tobacco Use   Smoking status: Former    Packs/day: 1.00    Years: 10.00    Additional pack years: 0.00    Total pack years: 10.00    Types: Cigarettes    Quit date: 09/22/1982    Years since quitting: 40.6   Smokeless tobacco: Never   Tobacco comments:    quit 1983  Vaping Use   Vaping Use: Never used  Substance and Sexual Activity   Alcohol use: No   Drug use: No   Sexual activity: Yes    Partners: Male    Birth control/protection: Post-menopausal

## 2023-04-19 NOTE — Progress Notes (Signed)
Doing good; could tell a difference after first treatment

## 2023-04-25 ENCOUNTER — Ambulatory Visit: Payer: PPO | Admitting: Orthopaedic Surgery

## 2023-04-25 DIAGNOSIS — I10 Essential (primary) hypertension: Secondary | ICD-10-CM | POA: Diagnosis not present

## 2023-04-25 DIAGNOSIS — Z6836 Body mass index (BMI) 36.0-36.9, adult: Secondary | ICD-10-CM | POA: Diagnosis not present

## 2023-04-25 DIAGNOSIS — Z7984 Long term (current) use of oral hypoglycemic drugs: Secondary | ICD-10-CM | POA: Diagnosis not present

## 2023-04-25 DIAGNOSIS — E119 Type 2 diabetes mellitus without complications: Secondary | ICD-10-CM | POA: Diagnosis not present

## 2023-04-27 ENCOUNTER — Encounter: Payer: Self-pay | Admitting: Sports Medicine

## 2023-04-27 ENCOUNTER — Ambulatory Visit: Payer: PPO | Admitting: Sports Medicine

## 2023-04-27 DIAGNOSIS — M7061 Trochanteric bursitis, right hip: Secondary | ICD-10-CM

## 2023-04-27 DIAGNOSIS — M7631 Iliotibial band syndrome, right leg: Secondary | ICD-10-CM

## 2023-04-27 DIAGNOSIS — M7071 Other bursitis of hip, right hip: Secondary | ICD-10-CM

## 2023-04-27 DIAGNOSIS — Z96641 Presence of right artificial hip joint: Secondary | ICD-10-CM | POA: Diagnosis not present

## 2023-04-27 NOTE — Progress Notes (Signed)
Lauren [name] - 77 y.o. female MRN [id number]  Date of birth: Jul 08, 1946  Office Visit Note: Visit Date: 04/27/2023 PCP: Lauren Floro, MD Referred by: Lauren Floro, MD  Subjective: Chief Complaint  Patient presents with   Right Leg - Follow-up   HPI: Lauren [name] is a pleasant 77 y.o. female who presents today for   Has upcoming bilateral hip \\MRI  on 04/29/2023.  She is following up with Lauren [name] to review these and discuss next steps.  Her lateral hip and right IT band syndrome is certainly getting better with shockwave treatments.  We did perform a trial over the iliopsoas tendon and anterior hip flexors last visit and she also noticed further improvement with this.  As a reminder, she had essentially 100% pain relief for the first 1 to 2 weeks after her iliopsoas tendon sheath injection, although this has worn off now.  Pertinent ROS were reviewed with the patient and found to be negative unless otherwise specified above in HPI.   Assessment & Plan: Visit Diagnoses:  1. History of right hip replacement   2. Iliopsoas bursitis of right hip   3. It band syndrome, right   4. Trochanteric bursitis, right hip    Plan: Lauren [name] is making good improvement from extracorporeal shockwave therapy for the lateral hip and IT band, we did do a trial of this over her iliopsoas and anterior hip flexors at last visit and she noticed further improvement with this as well.  We did repeat ESWT treatment today.  Would like her to continue her home IT band stretching and home exercise.  She has upcoming bilateral hip MRIs, we will follow-up after this when she reviews them.  Depending on what that shows and her degree of improvement we will consider additional shockwave treatment trials or if any surgical intervention is planned we will have Lauren [name] discussed this with her. F/u in 3 weeks.  Follow-up: Return for f/u 2-3 weeks for R-ant hip and IT-band.   Meds & Orders: No  orders of the defined types were placed in this encounter.  No orders of the defined types were placed in this encounter.    Procedures:   Procedure: ECSWT Indications:  IT-band syndrome, GT syndrome, and iliopsoas tendinopathy/bursitis   Procedure Details Consent: Risks of procedure as well as the alternatives and risks of each were explained to the patient.  Verbal consent for procedure obtained. Time Out: Verified patient identification, verified procedure, site was marked, verified correct patient position. The area was cleaned with alcohol swab.     The right greater trochanter and IT-band was targeted for Extracorporeal shockwave therapy.    Preset: GT bursitits/IT-band Power Level: 120 mJ Frequency: 12 Hz Impulse/cycles: 2500 Head size: Regular   Patient tolerated procedure well without immediate complications.   The right iliopsoas and anterior hip flexors was targeted for Extracorporeal shockwave therapy.    Preset: Muscle Injury Power Level: 120 mJ Frequency: 12 Hz Impulse/cycles: 2500 Head size: Regular   Patient tolerated procedure well without immediate complications.     Clinical History: No specialty comments available.  She reports that she quit smoking about 40 years ago. Her smoking use included cigarettes. She has a 10.00 pack-year smoking history. She has never used smokeless tobacco.  Recent Labs    05/11/22 0954 07/23/22 0936 03/23/23 1012  HGBA1C 6.1* 6.1* 6.5*    Objective:   Vital Signs: There were no vitals taken for this visit.  Physical Exam  Gen: Well-appearing, in no acute distress; non-toxic CV: Well-perfused. Warm.  Resp: Breathing unlabored on room air; no wheezing. Psych: Fluid speech in conversation; appropriate affect; normal thought process Neuro: Sensation intact throughout. No gross coordination deficits.   Ortho Exam - Right hip: + Mild TTP over the mid belly of the IT band.  Very mild palpation with deep iliopsoas and  anterior hip flexor palpation, although the hip moves fluidly with hip flexion.  There is no redness or swelling in this location.  Imaging: No results found.  Past Medical/Family/Surgical/Social History: Medications & Allergies reviewed per EMR, new medications updated. Patient Active Problem List   Diagnosis Date Noted   Depression 03/16/2023   Allergic rhinitis 12/29/2022   Seasonal affective disorder (HCC) 12/01/2022   BMI 34.0-34.9,adult 12/01/2022   Obesity, Beginning BMI 37.59 12/01/2022   OA (osteoarthritis) of knee 08/03/2022   Status post total right knee replacement 08/03/2022   Stress 07/19/2022   Other constipation 07/19/2022   Atypical chest pain 06/25/2021   Class 2 severe obesity with serious comorbidity and body mass index (BMI) of 37.0 to 37.9 in adult (HCC) 06/03/2021   Vitamin D deficiency 06/03/2021   Other fatigue 04/22/2021   SOB (shortness of breath) on exertion 04/22/2021   Lower extremity edema 03/05/2021   Coronary artery calcification 02/20/2020   Pure hypercholesterolemia 02/20/2020   Unilateral primary osteoarthritis, right knee 01/03/2019   Genetic testing 12/07/2018   Family history of breast cancer    Family history of prostate cancer    Family history of colon cancer    Family history of kidney cancer    Malignant neoplasm of upper-outer quadrant of right breast in female, 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/09/2018   Diabetic neuropathy, type II diabetes mellitus (HCC) 11/09/2018   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/02/2017   Trochanteric bursitis, right hip 05/10/2017   Status post total replacement of right hip 11/26/2016   DDD (degenerative disc disease), 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   Low back pain 04/23/2015   Hiatal hernia 04/13/2013   Diabetes mellitus (HCC) 01/26/2013   Umbilical hernia 07/24/2012   Essential hypertension    Past Medical History:  Diagnosis Date   Anxiety    Arthritis    Back, Hip- right   Atypical chest  pain 06/25/2021   Back pain    Cancer (HCC)    right breast   Cataract    Colitis    Coronary artery calcification 02/20/2020   Diabetes mellitus without complication (HCC)    Type II   Edema 03/05/2021   Edema of both lower extremities    Endometriosis    Family history of adverse reaction to anesthesia    Father - N/V - blood pressure; daughter N/V   Family history of breast cancer    Family history of colon cancer    Family history of kidney cancer    Family history of prostate cancer    Fatty liver    GERD (gastroesophageal reflux disease)    History of hiatal hernia    History of kidney stones    History of ulcerative colitis    Hypertension    Insomnia    Joint pain    Kidney problem    Lower extremity edema 03/05/2021   Lumbar disc disease    L4 L5   Neuropathy    Osteoarthritis    Other hyperlipidemia    Palpitations    Personal history of radiation therapy    Pneumonia    1983, 2003  Pure hypercholesterolemia 02/20/2020   PVCs (premature ventricular contractions)    benign   Umbilical hernia    Vitamin D deficiency    Wears glasses    Family History  Problem Relation Age of Onset   Hyperlipidemia Mother    Diabetes Mother    Hypertension Mother    Parkinson's disease Mother    Kidney disease Mother    Kidney disease Father    Stroke Father    Heart failure Father    Hypertension Father    Heart disease Father    Diabetes Father    Cancer Father        colon, prostate, and kidney   Heart disease Maternal Grandmother    Diabetes Maternal Grandmother    Heart disease Maternal Grandfather    Diabetes Maternal Grandfather    Cancer Maternal Grandfather        pt unaware of what kind   Stroke Paternal Grandmother    Stroke Paternal Grandfather    Cancer Maternal Aunt        cervical vs ovarian   Colon cancer Paternal Aunt        dx less than 50   Breast cancer Paternal Aunt        dx in her 36s   Prostate cancer Paternal Uncle    Breast  cancer Cousin        pat first cousin, dx over 25   Prostate cancer Other        PGF's father   Past Surgical History:  Procedure Laterality Date   APPENDECTOMY     BREAST BIOPSY  08-13-2009  DR TSUEI   EXCISION LEFT NIPPLE DUCT   BREAST EXCISIONAL BIOPSY Left    x2   BREAST EXCISIONAL BIOPSY Right    BREAST LUMPECTOMY Right 11/16/2018   invasive ductal   CHOLECYSTECTOMY  1992   COLON RESECTION  1986   ULCERATIVE COLITIS   COLON SURGERY     ESOPHAGEAL MANOMETRY N/A 06/11/2013   Procedure: ESOPHAGEAL MANOMETRY (EM);  Surgeon: Petra Kuba, MD;  Location: [facility];  Service: Endoscopy;  Laterality: N/A;   ESOPHAGOGASTRODUODENOSCOPY (EGD) WITH PROPOFOL N/A 05/31/2016   Procedure: ESOPHAGOGASTRODUODENOSCOPY (EGD) WITH PROPOFOL;  Surgeon: Vida Rigger, MD;  Location: [facility];  Service: Endoscopy;  Laterality: N/A;   EXCISION RIGHT BREAST MASS   10-20-2005  DR Theron Arista YOUNG   EYE SURGERY Bilateral 2023   cataract   FRACTURE SURGERY     left arm   kidney stone removed  12/2009   KNEE ARTHROSCOPY Left    MCL   LEFT THUMB CARPOMETACARPAL JOINT SUSPENSIONPLASTY  04-06-2006  DR Mina Marble   LEFT WRIST ARTHROSCOPY W/ DEBRIDEMENT AND REMOVAL CYST  03-26-2004  DR Mina Marble   MYOMECTOMY     RADIOACTIVE SEED GUIDED PARTIAL MASTECTOMY WITH AXILLARY SENTINEL LYMPH NODE BIOPSY Right 11/16/2018   Procedure: RIGHT BREAST RADIOACTIVE SEED GUIDED PARTIAL MASTECTOMY WITH  SENTINEL  NODE BIOPSY;  Surgeon: Abigail Miyamoto, MD;  Location: [facility] OR;  Service: General;  Laterality: Right;   RIGHT COLECTOMY     RIGHT URETEROSCOPIC STONE EXTRACTION  12-11-2009  DR Jonny Ruiz [facility]   TENDON RELEASE     TONSILLECTOMY     TOTAL HIP ARTHROPLASTY Right 11/26/2016   Procedure: RIGHT TOTAL HIP ARTHROPLASTY ANTERIOR APPROACH;  Surgeon: Kathryne Hitch, MD;  Location: [facility] ORS;  Service: Orthopedics;  Laterality: Right;   TOTAL KNEE ARTHROPLASTY Left 12/02/2017   Procedure: LEFT TOTAL KNEE ARTHROPLASTY;  Surgeon:  Kathryne Hitch,  MD;  Location: [facility] OR[facility];  Service: Orthopedics;  Laterality: Left;   TOTAL KNEE ARTHROPLASTY Right 08/03/2022   Procedure: RIGHT TOTAL KNEE ARTHROPLASTY;  Surgeon: Kathryne Hitch, MD;  Location: [facility] OR;  Service: Orthopedics;  Laterality: Right;   WRIST SURGERY     both   Social History   Occupational History   Occupation: Retired  Tobacco Use   Smoking status: Former    Packs/day: 1.00    Years: 10.00    Additional pack years: 0.00    Total pack years: 10.00    Types: Cigarettes    Quit date: 09/22/1982    Years since quitting: 40.6   Smokeless tobacco: Never   Tobacco comments:    quit 1983  Vaping Use   Vaping Use: Never used  Substance and Sexual Activity   Alcohol use: No   Drug use: No   Sexual activity: Yes    Partners: Male    Birth control/protection: Post-menopausal

## 2023-04-27 NOTE — Progress Notes (Signed)
States she is doing better; shockwave  helping

## 2023-04-28 ENCOUNTER — Telehealth: Payer: Self-pay | Admitting: Hematology and Oncology

## 2023-04-28 NOTE — Telephone Encounter (Signed)
Left a message regarding new appointment time/date

## 2023-04-29 ENCOUNTER — Ambulatory Visit
Admission: RE | Admit: 2023-04-29 | Discharge: 2023-04-29 | Disposition: A | Discharge status: Home / Self Care | Payer: PPO | Source: Ambulatory Visit | Attending: Orthopaedic Surgery | Admitting: Orthopaedic Surgery

## 2023-04-29 DIAGNOSIS — Z96641 Presence of right artificial hip joint: Secondary | ICD-10-CM | POA: Diagnosis not present

## 2023-04-29 DIAGNOSIS — M25551 Pain in right hip: Secondary | ICD-10-CM | POA: Diagnosis not present

## 2023-04-29 DIAGNOSIS — M25451 Effusion, right hip: Secondary | ICD-10-CM | POA: Diagnosis not present

## 2023-04-29 DIAGNOSIS — M25552 Pain in left hip: Secondary | ICD-10-CM

## 2023-04-29 DIAGNOSIS — M1612 Unilateral primary osteoarthritis, left hip: Secondary | ICD-10-CM | POA: Diagnosis not present

## 2023-05-03 ENCOUNTER — Ambulatory Visit: Payer: PPO | Admitting: Physician Assistant

## 2023-05-04 DIAGNOSIS — M150 Primary generalized (osteo)arthritis: Secondary | ICD-10-CM | POA: Diagnosis not present

## 2023-05-04 DIAGNOSIS — M25551 Pain in right hip: Secondary | ICD-10-CM | POA: Diagnosis not present

## 2023-05-04 DIAGNOSIS — M25552 Pain in left hip: Secondary | ICD-10-CM | POA: Diagnosis not present

## 2023-05-04 DIAGNOSIS — M47817 Spondylosis without myelopathy or radiculopathy, lumbosacral region: Secondary | ICD-10-CM | POA: Diagnosis not present

## 2023-05-09 DIAGNOSIS — H524 Presbyopia: Secondary | ICD-10-CM | POA: Diagnosis not present

## 2023-05-09 DIAGNOSIS — H2511 Age-related nuclear cataract, right eye: Secondary | ICD-10-CM | POA: Diagnosis not present

## 2023-05-09 DIAGNOSIS — H353131 Nonexudative age-related macular degeneration, bilateral, early dry stage: Secondary | ICD-10-CM | POA: Diagnosis not present

## 2023-05-09 DIAGNOSIS — H5203 Hypermetropia, bilateral: Secondary | ICD-10-CM | POA: Diagnosis not present

## 2023-05-09 DIAGNOSIS — H3561 Retinal hemorrhage, right eye: Secondary | ICD-10-CM | POA: Diagnosis not present

## 2023-05-09 DIAGNOSIS — H5202 Hypermetropia, left eye: Secondary | ICD-10-CM | POA: Diagnosis not present

## 2023-05-10 NOTE — Progress Notes (Signed)
TeleHealth Visit:  This visit was completed with telemedicine (audio/video) technology. Lauren [name] has verbally consented to this TeleHealth visit. The patient is located at home, the provider is located at home. The participants in this visit include the listed provider and patient. The visit was conducted today via MyChart video.  OBESITY Lauren [name] is here to discuss her progress with her obesity treatment plan along with follow-up of her obesity related diagnoses.   Today's visit was # 26 Starting weight: 219 lbs Starting date: 04/22/21 Weight at last in office visit: 210 lbs on 04/13/23 Total weight loss: 9 lbs at last in office visit on 04/13/23. Today's reported weight (05/11/23):  210 lbs  Nutrition Plan: the Category 2 plan  Current exercise: none   Interim History:  She has lost Lauren [name] started our clinic on 04/22/2021 at 219 pounds.  Lost down to 186 pounds over the next 6-7 months (-33).  Has since regained to 210 pounds. She is sporadic with meal plan adherence. She has really thought about her nutrition this week and the importance of protein intake.  When she does not follow the plan she often has no protein until dinnertime. She really wants to improve compliance with the plan and plans on grocery shopping today.  She saw Ortho today and left total hip replacement was recommended due to severe osteoarthritis. Assessment/Plan:  1. Type 2 Diabetes Mellitus with other specified complication, without long-term current use of insulin HgbA1c is at goal. Last A1c was 6.5 on 03/23/2023. Medication(s): Mounjaro 2.5 mg SQ weekly- has 3 injections left.  No longer having nausea.  Notes some appetite suppression.  Lab Results  Component Value Date   HGBA1C 6.5 (H) 03/23/2023   HGBA1C 6.1 (H) 07/23/2022   HGBA1C 6.1 (H) 05/11/2022   Lab Results  Component Value Date   LDLCALC 56 05/11/2022   CREATININE 1.11 (H) 08/04/2022   No results found for: "GFR"  Plan: Continue  and increase dose Mounjaro 5.0 mg SQ weekly   2. Vitamin D Deficiency Vitamin D is not at goal of 50.  Most recent vitamin D level was 55.8 on 12/01/2022. Notes fatigue. She is on  prescription ergocalciferol 50,000 IU weekly. Lab Results  Component Value Date   VD25OH 55.8 12/01/2022   VD25OH 69.3 05/11/2022   VD25OH 86.9 12/10/2021    Plan: Continue and refill  prescription ergocalciferol 50,000 IU weekly   3. Other depression/emotional eating Lauren [name] has had issues with stress/emotional eating. She is experiencing a lot of stress-chronic left hip pain, right IT band syndrome pain, 78 year old son struggles with opioid addiction. Currently this is moderately controlled. Overall mood is stable. Medication(s): Lexapro 20 mg daily.  Reports it is very effective for her.  Plan: Continue and refill Lexapro 20 mg daily.   4. Generalized Obesity: Current BMI 36  Lauren [name] is currently in the action stage of change. As such, her goal is to continue with weight loss efforts.  She has agreed to the Category 2 plan.  1.  Go to grocery store on full stomach. 2.  Adhere closely to plan until next follow-up.  Exercise goals: Increase NEAT.  Behavioral modification strategies: increasing lean protein intake, decreasing simple carbohydrates , planning for success, and keep healthy foods in the home.  Lauren [name] has agreed to follow-up with our clinic in 4 weeks.  No orders of the defined types were placed in this encounter.   Medications Discontinued During This Encounter  Medication Reason   tirzepatide [facility]) 2.5 MG/0.5ML Pen Dose  change   Vitamin D, Ergocalciferol, (DRISDOL) 1.25 MG (50000 UNIT) CAPS capsule Reorder   escitalopram (LEXAPRO) 20 MG tablet Reorder     Meds ordered this encounter  Medications   tirzepatide (MOUNJARO) 5 MG/0.5ML Pen    Sig: Inject 5 mg into the skin once a week.    Dispense:  2 mL    Refill:  0    Order Specific Question:   Supervising  Provider    Answer:   Glennis Brink [[id number]]   escitalopram (LEXAPRO) 20 MG tablet    Sig: Take 1 tablet (20 mg total) by mouth daily.    Dispense:  30 tablet    Refill:  0    PLEASE DISREGARD PREVIOUS PRESCRIPTION FOR 10 MG. DOSE INCREASE.    Order Specific Question:   Supervising Provider    Answer:   Glennis Brink [[id number]]   Vitamin D, Ergocalciferol, (DRISDOL) 1.25 MG (50000 UNIT) CAPS capsule    Sig: Take 1 capsule (50,000 Units total) by mouth every 7 (seven) days.    Dispense:  4 capsule    Refill:  0    Order Specific Question:   Supervising Provider    Answer:   Glennis Brink [[id number]]      Objective:   VITALS: Per patient if applicable, see vitals. GENERAL: Alert and in no acute distress. CARDIOPULMONARY: No increased WOB. Speaking in clear sentences.  PSYCH: Pleasant and cooperative. Speech normal rate and rhythm. Affect is appropriate. Insight and judgement are appropriate. Attention is focused, linear, and appropriate.  NEURO: Oriented as arrived to appointment on time with no prompting.   Attestation Statements:   Reviewed by clinician on day of visit: allergies, medications, problem list, medical history, surgical history, family history, social history, and previous encounter notes.  This was prepared with the assistance of Engineer, civil (consulting).  Occasional wrong-word or sound-a-like substitutions may have occurred due to the inherent limitations of voice recognition software.

## 2023-05-11 ENCOUNTER — Encounter (INDEPENDENT_AMBULATORY_CARE_PROVIDER_SITE_OTHER): Payer: Self-pay | Admitting: Family Medicine

## 2023-05-11 ENCOUNTER — Telehealth (INDEPENDENT_AMBULATORY_CARE_PROVIDER_SITE_OTHER): Payer: PPO | Admitting: Family Medicine

## 2023-05-11 ENCOUNTER — Encounter: Payer: Self-pay | Admitting: Orthopaedic Surgery

## 2023-05-11 ENCOUNTER — Ambulatory Visit: Payer: PPO | Admitting: Orthopaedic Surgery

## 2023-05-11 ENCOUNTER — Telehealth: Payer: Self-pay

## 2023-05-11 DIAGNOSIS — Z7985 Long-term (current) use of injectable non-insulin antidiabetic drugs: Secondary | ICD-10-CM

## 2023-05-11 DIAGNOSIS — E1169 Type 2 diabetes mellitus with other specified complication: Secondary | ICD-10-CM

## 2023-05-11 DIAGNOSIS — E669 Obesity, unspecified: Secondary | ICD-10-CM

## 2023-05-11 DIAGNOSIS — E559 Vitamin D deficiency, unspecified: Secondary | ICD-10-CM

## 2023-05-11 DIAGNOSIS — F3289 Other specified depressive episodes: Secondary | ICD-10-CM | POA: Diagnosis not present

## 2023-05-11 DIAGNOSIS — M1612 Unilateral primary osteoarthritis, left hip: Secondary | ICD-10-CM | POA: Diagnosis not present

## 2023-05-11 DIAGNOSIS — Z6836 Body mass index (BMI) 36.0-36.9, adult: Secondary | ICD-10-CM

## 2023-05-11 MED ORDER — ESCITALOPRAM OXALATE 20 MG PO TABS: 20.0000 mg | ORAL_TABLET | Freq: Every day | ORAL | 0 refills | Status: DC

## 2023-05-11 MED ORDER — TIRZEPATIDE 5 MG/0.5ML ~~LOC~~ SOAJ
5.0000 mg | SUBCUTANEOUS | 0 refills | Status: DC
Start: 2023-05-11 — End: 2023-06-08

## 2023-05-11 MED ORDER — VITAMIN D (ERGOCALCIFEROL) 1.25 MG (50000 UNIT) PO CAPS
50000.0000 [IU] | ORAL_CAPSULE | ORAL | 0 refills | Status: DC
Start: 2023-05-11 — End: 2023-06-08

## 2023-05-11 NOTE — Telephone Encounter (Signed)
Received through Cover My Meds: 10-JUL-24:10-JUL-25 Mounjaro 5MG /0.5ML Bryant SOPN Quantity:2;

## 2023-05-11 NOTE — Telephone Encounter (Signed)
PA submitted through Cover My Meds for [facility]. Awaiting insurance determination. Key: [id number]

## 2023-05-11 NOTE — Progress Notes (Signed)
The patient comes in today to go over MRI of both of her hips.  She has a history of both her knees being replaced with her most recent knee replacement in October of last year that with the right knee.  She is an active 77 year old female.  She has lost her husband.  Her daughter is with her today.  We replaced her right hip in 2018.  She does see my partner Dr. Shon Baton and he is provided shockwave treatment for her IT band and trochanteric area and that is helped greatly.  I did go over the MRIs with her.  Her left hip MRI shows severe osteoarthritis.  There is degenerative labral tearing as well and signal changes throughout the femoral head and acetabulum with significant cartilage loss.  The right hip replacement appears stable with no fluid collections and no complicating features around the hip replacement itself.  Her left hip does move without any blocks or rotation but severe pain in the groin with rotation.  I do feel that this is affecting her posture and probably contributes to her back pain as well.  We talked in length in detail about the potential for hip replacement when she is ready.  She should occasionally ambulate with a cane to make sure she does not fall.  She will put this in her right hand to offload the left hip.  She is going to talk with her daughter more in length in detail and determine when is the appropriate time to proceed with a left hip replacement.  She will call and let us know.  She is diabetic but her hemoglobin A1c is 6.5.

## 2023-05-19 ENCOUNTER — Ambulatory Visit: Payer: PPO | Admitting: Sports Medicine

## 2023-05-19 ENCOUNTER — Encounter: Payer: Self-pay | Admitting: Sports Medicine

## 2023-05-19 DIAGNOSIS — M7631 Iliotibial band syndrome, right leg: Secondary | ICD-10-CM

## 2023-05-19 DIAGNOSIS — M1612 Unilateral primary osteoarthritis, left hip: Secondary | ICD-10-CM | POA: Diagnosis not present

## 2023-05-19 DIAGNOSIS — Z96641 Presence of right artificial hip joint: Secondary | ICD-10-CM

## 2023-05-19 DIAGNOSIS — S76012S Strain of muscle, fascia and tendon of left hip, sequela: Secondary | ICD-10-CM

## 2023-05-19 DIAGNOSIS — M7061 Trochanteric bursitis, right hip: Secondary | ICD-10-CM

## 2023-05-19 NOTE — Progress Notes (Signed)
Is interested in more shockwave treatments Can notice a difference when she gets them, but wants them more frequently Patient was instructed in 10 minutes of therapeutic exercises for left hip to improve strength, ROM and function according to my instructions and plan of care by a Certified Athletic Trainer during the office visit. A customized handout was provided and demonstration of proper technique shown and discussed. Patient did perform exercises and demonstrate understanding through teachback.  All questions discussed and answered.

## 2023-05-19 NOTE — Progress Notes (Addendum)
Lauren [name] - 77 y.o. female MRN [id number]  Date of birth: January 11, 1946  Office Visit Note: Visit Date: 05/19/2023 PCP: Daisy Floro, MD Referred by: Daisy Floro, MD  Subjective: Chief Complaint  Patient presents with  . Left Hip - Pain   HPI: Lauren [name] is a pleasant 77 y.o. female who presents today for follow-up of bilateral hip pain.  Right hip -still having some pain within the groin and the IT band.  We have done a few sessions of extracorporeal shockwave therapy which she responded to well and had good relief of her pain.  She is doing her IT band stretching at times.  Left hip -patient had an MRI, did see Dr. Magnus Ivan as well, she has severe osteoarthritis and this is limiting her pain.  She notes that at some point she may need to have a hip replacement but she is not ready for this currently.  She is asking what other treatment she has in the interim.  Pertinent ROS were reviewed with the patient and found to be negative unless otherwise specified above in HPI.   Assessment & Plan: Visit Diagnoses:  1. Unilateral primary osteoarthritis, left hip   2. History of right hip replacement   3. It band syndrome, right   4. Trochanteric bursitis, right hip   5. Tear of left gluteus minimus tendon, sequela    Plan: Discussed with Lauren [name] options for both her hips.  The left hip she has severe osteoarthritis but she is not ready for hip replacement.  We discussed options such as oral medication, therapy, injection.  At this point she is considering ultrasound-guided injection of the left hip to give her some relief.  She is also interested in doing some home rehab - I did print out a customized handout for the hips for maintaining range of motion and strengthening exercises.  My athletic trainer Lequita Halt is doing [employer] organ[employer] did review exercises with her in the room today, she is to perform these once daily.  We did repeat extracorporeal shockwave  therapy for the right trochanteric, IT band and iliopsoas tendon.  Will see what sort of relief she gets from this and we can continue a few additional treatments in the future if she desires.  May use heat, over-the-counter anti-inflammatories as needed.  Follow-up: Return for f/u for L-hip eval, consider injection.   Meds & Orders: No orders of the defined types were placed in this encounter.  No orders of the defined types were placed in this encounter.    Procedures: Procedure: ECSWT Indications:  IT-band syndrome, GT syndrome, and iliopsoas tendinopathy/bursitis   Procedure Details Consent: Risks of procedure as well as the alternatives and risks of each were explained to the patient.  Verbal consent for procedure obtained. Time Out: Verified patient identification, verified procedure, site was marked, verified correct patient position. The area was cleaned with alcohol swab.     The right greater trochanter and IT-band was targeted for Extracorporeal shockwave therapy.    Preset: GT bursitits/IT-band Power Level: 120 mJ Frequency: 12 Hz Impulse/cycles: 2500 Head size: Regular   Patient tolerated procedure well without immediate complications.   The right iliopsoas and anterior hip flexors was targeted for Extracorporeal shockwave therapy.    Preset: Muscle Injury Power Level: 120 mJ Frequency: 12 Hz Impulse/cycles: 2000 Head size: Regular   Patient tolerated procedure well without immediate complications.      Clinical History: No specialty comments available.  She reports  that she quit smoking about 40 years ago. Her smoking use included cigarettes. She started smoking about 50 years ago. She has a 10 pack-year smoking history. She has never used smokeless tobacco.  Recent Labs    07/23/22 0936 03/23/23 1012  HGBA1C 6.1* 6.5*    Objective:   Vital Signs: There were no vitals taken for this visit.  Physical Exam  Gen: Well-appearing, in no acute distress;  non-toxic CV:  Well-perfused. Warm.  Resp: Breathing unlabored on room air; no wheezing. Psych: Fluid speech in conversation; appropriate affect; normal thought process Neuro: Sensation intact throughout. No gross coordination deficits.   Ortho Exam - Bilateral hips: There is a well-healed surgical scar from prior right anterior hip arthroplasty that is without evidence of infection.  Positive TTP with deep palpation over the iliopsoas and over the proximal and mid IT band on the right.  There is some pain and weakness with resisted hip abduction. Pain with forward flexion.  The left hip there is no swelling redness or effusion.  There is some restriction with internal and external logroll, positive FADIR test.  No TTP over the greater trochanter on the left side.  Imaging:  *Independent review and interpretation of both the right and left hip MRI on 04/21/2023 was performed by myself today.  Left hip shows significant cartilage loss of the left hip joint with severe osteoarthritic change.  There is gluteus minimus and to a lesser degree medius tendinopathy with partial chronic tearing of the gluteus minimus at both the right and the left hip.  Mild degree of trace fluid over the bursa itself.  No hardware abnormality noted on MRI although some metal artifact present.  MR Hip Right w/o contrast CLINICAL DATA:  Right hip replacement. Chronic left hip pain. Right hip pain for 4 months.  EXAM: MR OF THE RIGHT HIP WITHOUT CONTRAST  MR OF THE LEFT HIP WITHOUT CONTRAST  TECHNIQUE: Multiplanar, multisequence MR imaging of the right hip was performed. No intravenous contrast was administered.  Multiplanar, multisequence MR imaging of the left hip was performed. No intravenous contrast was administered.  COMPARISON:  None Available.  FINDINGS: Bones:  Right total hip arthroplasty with susceptibility artifact partially obscuring the adjacent soft tissue and osseous structures.  No periarticular fluid collection or osteolysis. No acute fracture or dislocation.  No left hip fracture, dislocation or avascular necrosis.  No periosteal reaction or bone destruction. No aggressive osseous lesion.  Normal sacrum and sacroiliac joints. No SI joint widening or erosive changes.  Articular cartilage and labrum  Articular cartilage: High-grade partial-thickness cartilage loss with areas of full-thickness cartilage loss of the left femoral head and acetabulum and subchondral reactive marrow edema in the left femoral head.  Labrum:  Right anterior labral tear.  Joint or bursal effusion  Joint effusion: Moderate left hip joint effusion. No SI joint effusion.  Bursae:  No bursal fluid.  Muscles and tendons  Flexors: Normal.  Extensors: Normal.  Abductors: Normal.  Adductors: Normal.  Gluteals: Atrophy of the left gluteus minimus muscle. Chronic tear of the left gluteus minimus tendon insertion.  Hamstrings: Small partial-thickness tear of the hamstring origin bilaterally.  Other findings  No pelvic free fluid. No fluid collection or hematoma. No inguinal lymphadenopathy. No inguinal hernia.  IMPRESSION: 1. Right total hip arthroplasty without periarticular fluid collection or osteolysis. No hardware failure or complication. 2. Severe osteoarthritis of the left hip. Moderate left hip joint effusion. 3. Small partial-thickness tear of the hamstring origin bilaterally.  Electronically  Signed   By: Elige Ko M.D.   On: 05/06/2023 10:55 MR Hip Left w/o contrast CLINICAL DATA:  Right hip replacement. Chronic left hip pain. Right hip pain for 4 months.  EXAM: MR OF THE RIGHT HIP WITHOUT CONTRAST  MR OF THE LEFT HIP WITHOUT CONTRAST  TECHNIQUE: Multiplanar, multisequence MR imaging of the right hip was performed. No intravenous contrast was administered.  Multiplanar, multisequence MR imaging of the left hip was performed. No intravenous  contrast was administered.  COMPARISON:  None Available.  FINDINGS: Bones:  Right total hip arthroplasty with susceptibility artifact partially obscuring the adjacent soft tissue and osseous structures. No periarticular fluid collection or osteolysis. No acute fracture or dislocation.  No left hip fracture, dislocation or avascular necrosis.  No periosteal reaction or bone destruction. No aggressive osseous lesion.  Normal sacrum and sacroiliac joints. No SI joint widening or erosive changes.  Articular cartilage and labrum  Articular cartilage: High-grade partial-thickness cartilage loss with areas of full-thickness cartilage loss of the left femoral head and acetabulum and subchondral reactive marrow edema in the left femoral head.  Labrum:  Right anterior labral tear.  Joint or bursal effusion  Joint effusion: Moderate left hip joint effusion. No SI joint effusion.  Bursae:  No bursal fluid.  Muscles and tendons  Flexors: Normal.  Extensors: Normal.  Abductors: Normal.  Adductors: Normal.  Gluteals: Atrophy of the left gluteus minimus muscle. Chronic tear of the left gluteus minimus tendon insertion.  Hamstrings: Small partial-thickness tear of the hamstring origin bilaterally.  Other findings  No pelvic free fluid. No fluid collection or hematoma. No inguinal lymphadenopathy. No inguinal hernia.  IMPRESSION: 1. Right total hip arthroplasty without periarticular fluid collection or osteolysis. No hardware failure or complication. 2. Severe osteoarthritis of the left hip. Moderate left hip joint effusion. 3. Small partial-thickness tear of the hamstring origin bilaterally.  Electronically Signed   By: Elige Ko M.D.   On: 05/06/2023 10:55    Past Medical/Family/Surgical/Social History: Medications & Allergies reviewed per EMR, new medications updated. Patient Active Problem List   Diagnosis Date Noted  . Unilateral primary osteoarthritis,  left hip 05/11/2023  . Depression 03/16/2023  . Allergic rhinitis 12/29/2022  . Seasonal affective disorder (HCC) 12/01/2022  . BMI 34.0-34.9,adult 12/01/2022  . Obesity, Beginning BMI 37.59 12/01/2022  . OA (osteoarthritis) of knee 08/03/2022  . Status post total right knee replacement 08/03/2022  . Stress 07/19/2022  . Other constipation 07/19/2022  . Atypical chest pain 06/25/2021  . Class 2 severe obesity with serious comorbidity and body mass index (BMI) of 37.0 to 37.9 in adult [facility]) 06/03/2021  . Vitamin D deficiency 06/03/2021  . Other fatigue 04/22/2021  . SOB (shortness of breath) on exertion 04/22/2021  . Lower extremity edema 03/05/2021  . Coronary artery calcification 02/20/2020  . Pure hypercholesterolemia 02/20/2020  . Unilateral primary osteoarthritis, right knee 01/03/2019  . Genetic testing 12/07/2018  . Family history of breast cancer   . Family history of prostate cancer   . Family history of colon cancer   . Family history of kidney cancer   . Malignant neoplasm of upper-outer quadrant of right breast in female, 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/09/2018  . Diabetic neuropathy, type II diabetes mellitus (HCC) 11/09/2018  . 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/02/2017  . Trochanteric bursitis, right hip 05/10/2017  . Status post total replacement of right hip 11/26/2016  . DDD (degenerative disc disease), 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  . Low back pain 04/23/2015  .  Hiatal hernia 04/13/2013  . Diabetes mellitus (HCC) 01/26/2013  . Umbilical hernia 07/24/2012  . Essential hypertension    Past Medical History:  Diagnosis Date  . Anxiety   . Arthritis    Back, Hip- right  . Atypical chest pain 06/25/2021  . Back pain   . Cancer (HCC)    right breast  . Cataract   . Colitis   . Coronary artery calcification 02/20/2020  . Diabetes mellitus without complication (HCC)    Type II  . Edema 03/05/2021  . Edema of both lower extremities   . Endometriosis    . Family history of adverse reaction to anesthesia    Father - N/V - blood pressure; daughter N/V  . Family history of breast cancer   . Family history of colon cancer   . Family history of kidney cancer   . Family history of prostate cancer   . Fatty liver   . GERD (gastroesophageal reflux disease)   . History of hiatal hernia   . History of kidney stones   . History of ulcerative colitis   . Hypertension   . Insomnia   . Joint pain   . Kidney problem   . Lower extremity edema 03/05/2021  . 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 Neuropathy   . Osteoarthritis   . Other hyperlipidemia   . Palpitations   . Personal history of radiation therapy   . Pneumonia    1983, 2003  . Pure hypercholesterolemia 02/20/2020  . PVCs (premature ventricular contractions)    benign  . Umbilical hernia   . Vitamin D deficiency   . Wears glasses    Family History  Problem Relation Age of Onset  . Hyperlipidemia Mother   . Diabetes Mother   . Hypertension Mother   . Parkinson's disease Mother   . Kidney disease Mother   . Kidney disease Father   . Stroke Father   . Heart failure Father   . Hypertension Father   . Heart disease Father   . Diabetes Father   . Cancer Father        colon, prostate, and kidney  . Heart disease Maternal Grandmother   . Diabetes Maternal Grandmother   . Heart disease Maternal Grandfather   . Diabetes Maternal Grandfather   . Cancer Maternal Grandfather        pt unaware of what kind  . Stroke Paternal Grandmother   . Stroke Paternal Grandfather   . Cancer Maternal Aunt        cervical vs ovarian  . Colon cancer Paternal Aunt        dx less than 50  . Breast cancer Paternal Aunt        dx in her 93s  . Prostate cancer Paternal Uncle   . Breast cancer Cousin        pat first cousin, dx over 81  . Prostate cancer Other        PGF's father   Past Surgical History:  Procedure Laterality Date  . APPENDECTOMY    . BREAST BIOPSY  08-13-2009  DR TSUEI    EXCISION LEFT NIPPLE DUCT  . BREAST EXCISIONAL BIOPSY Left    x2  . BREAST EXCISIONAL BIOPSY Right   . BREAST LUMPECTOMY Right 11/16/2018   invasive ductal  . CHOLECYSTECTOMY  1992  . COLON RESECTION  1986   ULCERATIVE COLITIS  . COLON SURGERY    . ESOPHAGEAL MANOMETRY N/A 06/11/2013  Procedure: ESOPHAGEAL MANOMETRY (EM);  Surgeon: Petra Kuba, MD;  Location: [facility];  Service: Endoscopy;  Laterality: N/A;  . ESOPHAGOGASTRODUODENOSCOPY (EGD) WITH PROPOFOL N/A 05/31/2016   Procedure: ESOPHAGOGASTRODUODENOSCOPY (EGD) WITH PROPOFOL;  Surgeon: Vida Rigger, MD;  Location: [facility] ENDOSCOPY;  Service: Endoscopy;  Laterality: N/A;  . EXCISION RIGHT BREAST MASS   10-20-2005  DR Theron Arista YOUNG  . EYE SURGERY Bilateral 2023   cataract  . FRACTURE SURGERY     left arm  . kidney stone removed  12/2009  . KNEE ARTHROSCOPY Left    MCL  . LEFT THUMB CARPOMETACARPAL JOINT SUSPENSIONPLASTY  04-06-2006  DR Mina Marble  . LEFT WRIST ARTHROSCOPY W/ DEBRIDEMENT AND REMOVAL CYST  03-26-2004  DR Mina Marble  . MYOMECTOMY    . RADIOACTIVE SEED GUIDED PARTIAL MASTECTOMY WITH AXILLARY SENTINEL LYMPH NODE BIOPSY Right 11/16/2018   Procedure: RIGHT BREAST RADIOACTIVE SEED GUIDED PARTIAL MASTECTOMY WITH  SENTINEL  NODE BIOPSY;  Surgeon: Abigail Miyamoto, MD;  Location: [facility] OR;  Service: General;  Laterality: Right;  . RIGHT COLECTOMY    . RIGHT URETEROSCOPIC STONE EXTRACTION  12-11-2009  DR Bjorn Pippin  . TENDON RELEASE    . TONSILLECTOMY    . TOTAL HIP ARTHROPLASTY Right 11/26/2016   Procedure: RIGHT TOTAL HIP ARTHROPLASTY ANTERIOR APPROACH;  Surgeon: Kathryne Hitch, MD;  Location: [facility];  Service: Orthopedics;  Laterality: Right;  . TOTAL KNEE ARTHROPLASTY Left 12/02/2017   Procedure: LEFT TOTAL KNEE ARTHROPLASTY;  Surgeon: Kathryne Hitch, MD;  Location: [facility];  Service: Orthopedics;  Laterality: Left;  . TOTAL KNEE ARTHROPLASTY Right 08/03/2022   Procedure: RIGHT TOTAL KNEE ARTHROPLASTY;  Surgeon:  Kathryne Hitch, MD;  Location: [facility] OR;  Service: Orthopedics;  Laterality: Right;  . WRIST SURGERY     both   Social History   Occupational History  . Occupation: Retired  Tobacco Use  . Smoking status: Former    Current packs/day: 0.00    Average packs/day: 1 pack/day for 10.0 years (10.0 ttl pk-yrs)    Types: Cigarettes    Start date: 09/22/1972    Quit date: 09/22/1982    Years since quitting: 40.7  . Smokeless tobacco: Never  . Tobacco comments:    quit 1983  Vaping Use  . Vaping status: Never Used  Substance and Sexual Activity  . Alcohol use: No  . Drug use: No  . Sexual activity: Yes    Partners: Male    Birth control/protection: Post-menopausal

## 2023-05-29 ENCOUNTER — Other Ambulatory Visit (INDEPENDENT_AMBULATORY_CARE_PROVIDER_SITE_OTHER): Payer: Self-pay | Admitting: Family Medicine

## 2023-05-29 DIAGNOSIS — E1169 Type 2 diabetes mellitus with other specified complication: Secondary | ICD-10-CM

## 2023-06-01 DIAGNOSIS — M150 Primary generalized (osteo)arthritis: Secondary | ICD-10-CM | POA: Diagnosis not present

## 2023-06-01 DIAGNOSIS — M25552 Pain in left hip: Secondary | ICD-10-CM | POA: Diagnosis not present

## 2023-06-01 DIAGNOSIS — M47817 Spondylosis without myelopathy or radiculopathy, lumbosacral region: Secondary | ICD-10-CM | POA: Diagnosis not present

## 2023-06-01 DIAGNOSIS — G894 Chronic pain syndrome: Secondary | ICD-10-CM | POA: Diagnosis not present

## 2023-06-01 DIAGNOSIS — M25551 Pain in right hip: Secondary | ICD-10-CM | POA: Diagnosis not present

## 2023-06-01 DIAGNOSIS — Z79891 Long term (current) use of opiate analgesic: Secondary | ICD-10-CM | POA: Diagnosis not present

## 2023-06-02 ENCOUNTER — Ambulatory Visit: Payer: PPO | Admitting: Sports Medicine

## 2023-06-02 ENCOUNTER — Encounter: Payer: Self-pay | Admitting: Sports Medicine

## 2023-06-02 ENCOUNTER — Other Ambulatory Visit: Payer: Self-pay

## 2023-06-02 ENCOUNTER — Other Ambulatory Visit (INDEPENDENT_AMBULATORY_CARE_PROVIDER_SITE_OTHER): Payer: Self-pay | Admitting: Family Medicine

## 2023-06-02 DIAGNOSIS — M7061 Trochanteric bursitis, right hip: Secondary | ICD-10-CM | POA: Diagnosis not present

## 2023-06-02 DIAGNOSIS — M7631 Iliotibial band syndrome, right leg: Secondary | ICD-10-CM | POA: Diagnosis not present

## 2023-06-02 DIAGNOSIS — M1612 Unilateral primary osteoarthritis, left hip: Secondary | ICD-10-CM | POA: Diagnosis not present

## 2023-06-02 DIAGNOSIS — E559 Vitamin D deficiency, unspecified: Secondary | ICD-10-CM

## 2023-06-02 MED ORDER — LIDOCAINE HCL 1 % IJ SOLN
4.0000 mL | INTRAMUSCULAR | Status: AC | PRN
Start: 2023-06-02 — End: 2023-06-02
  Administered 2023-06-02: 4 mL

## 2023-06-02 MED ORDER — METHYLPREDNISOLONE ACETATE 40 MG/ML IJ SUSP
40.0000 mg | INTRAMUSCULAR | Status: AC | PRN
Start: 2023-06-02 — End: 2023-06-02
  Administered 2023-06-02: 40 mg via INTRA_ARTICULAR

## 2023-06-02 NOTE — Progress Notes (Signed)
Lauren [name] - 77 y.o. female MRN [id number]  Date of birth: 26-Mar-1946  Office Visit Note: Visit Date: 06/02/2023 PCP: Daisy Floro, MD Referred by: Daisy Floro, MD  Subjective: Chief Complaint  Patient presents with   Left Hip - Follow-up   HPI: Lauren [name] is a pleasant 77 y.o. female who presents today for follow-up for bilateral hip pain, left greater than right.  Lauren [name] has been receiving extracorporeal shockwave therapy for the right hip which is giving her relief.  Had bilateral hip MRIs and previous visits which showed left hip severe osteoarthritis.  The right hip bothers her more on the lateral side with her gluteal muscles, the left hip is more within the hip joint.  She is interested in pursuing an injection today.  She has been performing her home hip exercises for the lateral hip abductors, hip stabilization and IT band stretching.  Pertinent ROS were reviewed with the patient and found to be negative unless otherwise specified above in HPI.   Assessment & Plan: Visit Diagnoses:  1. Unilateral primary osteoarthritis, left hip   2. Trochanteric bursitis, right hip   3. It band syndrome, right    Plan: Lauren [name] has end-stage osteoarthritis of the left hip, she is not quite ready for hip replacement at this time.  We did discuss conservative options and through shared decision making elected to proceed with ultrasound-guided intra-articular hip injection, she tolerated well.  She will have about 48 hours of modified activity/rest, I would then like her to return to her home rehab exercises for both hips.  May use ice and over-the-counter anti-inflammatories for any postinjection pain.  She will follow-up next week for the right lateral hip to repeat shockwave treatment and further evaluation.  Follow-up: Return for next week for SWT.   Meds & Orders: No orders of the defined types were placed in this encounter.   Orders Placed This Encounter   Procedures   Large Joint Inj: L hip joint   US Guided Needle Placement - No Linked Charges     Procedures: Large Joint Inj: L hip joint on 06/02/2023 9:25 AM Indications: pain Details: 22 G 3.5 in needle, ultrasound-guided anterior approach Medications: 4 mL lidocaine 1 %; 40 mg methylPREDNISolone acetate 40 MG/ML Outcome: tolerated well, no immediate complications  Procedure: US-guided intra-articular hip injection, left After discussion on risks/benefits/indications and informed verbal consent was obtained, a timeout was performed. Patient was lying supine on exam table. The hip was cleaned with betadine and alcohol swabs. Then utilizing ultrasound guidance, the patient's femoral head and neck junction was identified and subsequently injected with 4:1 lidocaine:depomedrol via an in-plane approach with ultrasound visualization of the injectate administered into the hip joint. Patient tolerated procedure well without immediate complications.  Procedure, treatment alternatives, risks and benefits explained, specific risks discussed. Consent was given by the patient. Immediately prior to procedure a time out was called to verify the correct patient, procedure, equipment, support staff and site/side marked as required. Patient was prepped and draped in the usual sterile fashion.            Clinical History: No specialty comments available.  She reports that she quit smoking about 40 years ago. Her smoking use included cigarettes. She started smoking about 50 years ago. She has a 10 pack-year smoking history. She has never used smokeless tobacco.  Recent Labs    07/23/22 0936 03/23/23 1012  HGBA1C 6.1* 6.5*    Objective:   Vital Signs:  There were no vitals taken for this visit.  Physical Exam  Gen: Well-appearing, in no acute distress; non-toxic CV:  Well-perfused. Warm.  Resp: Breathing unlabored on room air; no wheezing. Psych: Fluid speech in conversation; appropriate affect;  normal thought process Neuro: Sensation intact throughout. No gross coordination deficits.   Ortho Exam - Left hip: There is no redness swelling or bony TTP.  There is restricted internal and external range of motion with mechanical block to internal with associated pain.  Positive FADIR test.  Imaging: Narrative & Impression  CLINICAL DATA:  Right hip replacement. Chronic left hip pain. Right hip pain for 4 months.   EXAM: MR OF THE RIGHT HIP WITHOUT CONTRAST   MR OF THE LEFT HIP WITHOUT CONTRAST   TECHNIQUE: Multiplanar, multisequence MR imaging of the right hip was performed. No intravenous contrast was administered.   Multiplanar, multisequence MR imaging of the left hip was performed. No intravenous contrast was administered.   COMPARISON:  None Available.   FINDINGS: Bones:   Right total hip arthroplasty with susceptibility artifact partially obscuring the adjacent soft tissue and osseous structures. No periarticular fluid collection or osteolysis. No acute fracture or dislocation.   No left hip fracture, dislocation or avascular necrosis.   No periosteal reaction or bone destruction. No aggressive osseous lesion.   Normal sacrum and sacroiliac joints. No SI joint widening or erosive changes.   Articular cartilage and labrum   Articular cartilage: High-grade partial-thickness cartilage loss with areas of full-thickness cartilage loss of the left femoral head and acetabulum and subchondral reactive marrow edema in the left femoral head.   Labrum:  Right anterior labral tear.   Joint or bursal effusion   Joint effusion: Moderate left hip joint effusion. No SI joint effusion.   Bursae:  No bursal fluid.   Muscles and tendons   Flexors: Normal.   Extensors: Normal.   Abductors: Normal.   Adductors: Normal.   Gluteals: Atrophy of the left gluteus minimus muscle. Chronic tear of the left gluteus minimus tendon insertion.   Hamstrings: Small  partial-thickness tear of the hamstring origin bilaterally.   Other findings   No pelvic free fluid. No fluid collection or hematoma. No inguinal lymphadenopathy. No inguinal hernia.   IMPRESSION: 1. Right total hip arthroplasty without periarticular fluid collection or osteolysis. No hardware failure or complication. 2. Severe osteoarthritis of the left hip. Moderate left hip joint effusion. 3. Small partial-thickness tear of the hamstring origin bilaterally.     Electronically Signed   By: Elige Ko M.D.   On: 05/06/2023 10:55   Past Medical/Family/Surgical/Social History: Medications & Allergies reviewed per EMR, new medications updated. Patient Active Problem List   Diagnosis Date Noted   Unilateral primary osteoarthritis, left hip 05/11/2023   Depression 03/16/2023   Allergic rhinitis 12/29/2022   Seasonal affective disorder (HCC) 12/01/2022   BMI 34.0-34.9,adult 12/01/2022   Obesity, Beginning BMI 37.59 12/01/2022   OA (osteoarthritis) of knee 08/03/2022   Status post total right knee replacement 08/03/2022   Stress 07/19/2022   Other constipation 07/19/2022   Atypical chest pain 06/25/2021   Class 2 severe obesity with serious comorbidity and body mass index (BMI) of 37.0 to 37.9 in adult [facility] 06/03/2021   Vitamin D deficiency 06/03/2021   Other fatigue 04/22/2021   SOB (shortness of breath) on exertion 04/22/2021   Lower extremity edema 03/05/2021   Coronary artery calcification 02/20/2020   Pure hypercholesterolemia 02/20/2020   Unilateral primary osteoarthritis, right knee 01/03/2019  Genetic testing 12/07/2018   Family history of breast cancer    Family history of prostate cancer    Family history of colon cancer    Family history of kidney cancer    Malignant neoplasm of upper-outer quadrant of right breast in female, 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/09/2018   Diabetic neuropathy, type II diabetes mellitus (HCC) 11/09/2018   Status post total left  knee replacement 12/02/2017   Trochanteric bursitis, right hip 05/10/2017   Status post total replacement of right hip 11/26/2016   DDD (degenerative disc disease), 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   Low back pain 04/23/2015   Hiatal hernia 04/13/2013   Diabetes mellitus (HCC) 01/26/2013   Umbilical hernia 07/24/2012   Essential hypertension    Past Medical History:  Diagnosis Date   Anxiety    Arthritis    Back, Hip- right   Atypical chest pain 06/25/2021   Back pain    Cancer (HCC)    right breast   Cataract    Colitis    Coronary artery calcification 02/20/2020   Diabetes mellitus without complication (HCC)    Type II   Edema 03/05/2021   Edema of both lower extremities    Endometriosis    Family history of adverse reaction to anesthesia    Father - N/V - blood pressure; daughter N/V   Family history of breast cancer    Family history of colon cancer    Family history of kidney cancer    Family history of prostate cancer    Fatty liver    GERD (gastroesophageal reflux disease)    History of hiatal hernia    History of kidney stones    History of ulcerative colitis    Hypertension    Insomnia    Joint pain    Kidney problem    Lower extremity edema 03/05/2021   Lumbar disc disease    L4 L5   Neuropathy    Osteoarthritis    Other hyperlipidemia    Palpitations    Personal history of radiation therapy    Pneumonia    1983, 2003   Pure hypercholesterolemia 02/20/2020   PVCs (premature ventricular contractions)    benign   Umbilical hernia    Vitamin D deficiency    Wears glasses    Family History  Problem Relation Age of Onset   Hyperlipidemia Mother    Diabetes Mother    Hypertension Mother    Parkinson's disease Mother    Kidney disease Mother    Kidney disease Father    Stroke Father    Heart failure Father    Hypertension Father    Heart disease Father    Diabetes Father    Cancer Father        colon, prostate, and kidney   Heart disease Maternal  Grandmother    Diabetes Maternal Grandmother    Heart disease Maternal Grandfather    Diabetes Maternal Grandfather    Cancer Maternal Grandfather        pt unaware of what kind   Stroke Paternal Grandmother    Stroke Paternal Grandfather    Cancer Maternal Aunt        cervical vs ovarian   Colon cancer Paternal Aunt        dx less than 50   Breast cancer Paternal Aunt        dx in her 49s   Prostate cancer Paternal Uncle    Breast cancer Cousin        pat  first cousin, dx over 62   Prostate cancer Other        PGF's father   Past Surgical History:  Procedure Laterality Date   APPENDECTOMY     BREAST BIOPSY  08-13-2009  DR TSUEI   EXCISION LEFT NIPPLE DUCT   BREAST EXCISIONAL BIOPSY Left    x2   BREAST EXCISIONAL BIOPSY Right    BREAST LUMPECTOMY Right 11/16/2018   invasive ductal   CHOLECYSTECTOMY  1992   COLON RESECTION  1986   ULCERATIVE COLITIS   COLON SURGERY     ESOPHAGEAL MANOMETRY N/A 06/11/2013   Procedure: ESOPHAGEAL MANOMETRY (EM);  Surgeon: Petra Kuba, MD;  Location: [facility];  Service: Endoscopy;  Laterality: N/A;   ESOPHAGOGASTRODUODENOSCOPY (EGD) WITH PROPOFOL N/A 05/31/2016   Procedure: ESOPHAGOGASTRODUODENOSCOPY (EGD) WITH PROPOFOL;  Surgeon: Vida Rigger, MD;  Location: [facility] ENDOSCOPY;  Service: Endoscopy;  Laterality: N/A;   EXCISION RIGHT BREAST MASS   10-20-2005  DR Theron Arista YOUNG   EYE SURGERY Bilateral 2023   cataract   FRACTURE SURGERY     left arm   kidney stone removed  12/2009   KNEE ARTHROSCOPY Left    MCL   LEFT THUMB CARPOMETACARPAL JOINT SUSPENSIONPLASTY  04-06-2006  DR Mina Marble   LEFT WRIST ARTHROSCOPY W/ DEBRIDEMENT AND REMOVAL CYST  03-26-2004  DR Mina Marble   MYOMECTOMY     RADIOACTIVE SEED GUIDED PARTIAL MASTECTOMY WITH AXILLARY SENTINEL LYMPH NODE BIOPSY Right 11/16/2018   Procedure: RIGHT BREAST RADIOACTIVE SEED GUIDED PARTIAL MASTECTOMY WITH  SENTINEL  NODE BIOPSY;  Surgeon: Abigail Miyamoto, MD;  Location: [facility] OR;  Service:  General;  Laterality: Right;   RIGHT COLECTOMY     RIGHT URETEROSCOPIC STONE EXTRACTION  12-11-2009  DR Jonny Ruiz [facility]   TENDON RELEASE     TONSILLECTOMY     TOTAL HIP ARTHROPLASTY Right 11/26/2016   Procedure: RIGHT TOTAL HIP ARTHROPLASTY ANTERIOR APPROACH;  Surgeon: Kathryne Hitch, MD;  Location: [facility] ORS;  Service: Orthopedics;  Laterality: Right;   TOTAL KNEE ARTHROPLASTY Left 12/02/2017   Procedure: LEFT TOTAL KNEE ARTHROPLASTY;  Surgeon: Kathryne Hitch, MD;  Location: [facility] OR[facility];  Service: Orthopedics;  Laterality: Left;   TOTAL KNEE ARTHROPLASTY Right 08/03/2022   Procedure: RIGHT TOTAL KNEE ARTHROPLASTY;  Surgeon: Kathryne Hitch, MD;  Location: [facility] OR;  Service: Orthopedics;  Laterality: Right;   WRIST SURGERY     both   Social History   Occupational History   Occupation: Retired  Tobacco Use   Smoking status: Former    Current packs/day: 0.00    Average packs/day: 1 pack/day for 10.0 years (10.0 ttl pk-yrs)    Types: Cigarettes    Start date: 09/22/1972    Quit date: 09/22/1982    Years since quitting: 40.7   Smokeless tobacco: Never   Tobacco comments:    quit 1983  Vaping Use   Vaping status: Never Used  Substance and Sexual Activity   Alcohol use: No   Drug use: No   Sexual activity: Yes    Partners: Male    Birth control/protection: Post-menopausal

## 2023-06-03 ENCOUNTER — Ambulatory Visit: Payer: PPO | Admitting: Sports Medicine

## 2023-06-06 ENCOUNTER — Encounter: Payer: Self-pay | Admitting: Sports Medicine

## 2023-06-06 ENCOUNTER — Ambulatory Visit (INDEPENDENT_AMBULATORY_CARE_PROVIDER_SITE_OTHER): Payer: PPO | Admitting: Sports Medicine

## 2023-06-06 VITALS — BP 114/73 | HR 85 | Ht 64.0 in | Wt 206.0 lb

## 2023-06-06 DIAGNOSIS — M1612 Unilateral primary osteoarthritis, left hip: Secondary | ICD-10-CM

## 2023-06-06 DIAGNOSIS — S76012S Strain of muscle, fascia and tendon of left hip, sequela: Secondary | ICD-10-CM | POA: Diagnosis not present

## 2023-06-06 DIAGNOSIS — M7631 Iliotibial band syndrome, right leg: Secondary | ICD-10-CM

## 2023-06-06 DIAGNOSIS — M25551 Pain in right hip: Secondary | ICD-10-CM | POA: Diagnosis not present

## 2023-06-06 NOTE — Progress Notes (Signed)
Lauren [name] - 77 y.o. female MRN [id number]  Date of birth: Apr 14, 1946  Office Visit Note: Visit Date: 06/06/2023 PCP: Lauren Floro, MD Referred by: Lauren Floro, MD  Subjective: Chief Complaint  Patient presents with   Right Leg - Pain    Patient presents with right leg pain. She states that she has received relief from shockwave therapy. She is on hydrocodone routinely.     HPI: Lauren [name] "Lauren [name]" is a pleasant 77 y.o. female who presents today for follow-up of right iliopsoas and lateral hip pain. F/u of left hip s/p injection.  Left hip -we did proceed with ultrasound-guided intra-articular hip injection on the left on 06/02/2023, she states that had this feeling excellent, this essentially resolved her pain.  She did have a mild headache for the first day or 2 but this resolved and her hip pain is much better as well.  Right hip/IT band -in the past we did proceed with extracorporeal shockwave therapy which is giving her relief.  She did have an episode last week where she felt a small pop/snap over the anterior and mid thigh, had some soreness for the first few days but this is improving with conservative management.  She did hold on her home hip exercises since that time given the exacerbation.  Pertinent ROS were reviewed with the patient and found to be negative unless otherwise specified above in HPI.   Assessment & Plan: Visit Diagnoses:  1. Unilateral primary osteoarthritis, left hip   2. It band syndrome, right   3. Tear of left gluteus minimus tendon, sequela   4. Pain in right hip    Plan: Discussed with Lauren [name] we are both glad she had such excellent relief from the corticosteroid injection for her left hip.  She does have severe osteoarthritis, we can repeat these infrequently, but ultimately she may end up needing a replacement.  For the right hip, I am curious if she had an iliopsoas snapping/impingement over her right hip arthroplasty, but she  has no bruising swelling and the hip is functioning well today.  Through shared decision-making, did elect to proceed with repeat shockwave therapy.  She may continue her IT band stretching, but I would like her to hold from her home hip exercises for an additional week until we see her back next week for repeat eval and possible shockwave.  She may continue her hydrocodone/acetaminophen 10-325 mg 3 times daily as needed for her chronic pain management.  Follow-up in 1 week.  Follow-up: Return in about 1 week (around 06/13/2023) for for R-hip ant/lat pain (reg visit).   Meds & Orders: No orders of the defined types were placed in this encounter.  No orders of the defined types were placed in this encounter.    Procedures: Procedure: ECSWT Indications:  IT-band syndrome, GT syndrome, and iliopsoas tendinopathy/bursitis   Procedure Details Consent: Risks of procedure as well as the alternatives and risks of each were explained to the patient.  Verbal consent for procedure obtained. Time Out: Verified patient identification, verified procedure, site was marked, verified correct patient position. The area was cleaned with alcohol swab.     The right greater trochanter and IT-band was targeted for Extracorporeal shockwave therapy.    Preset: GT bursitits/IT-band Power Level: 120 mJ Frequency: 12 Hz Impulse/cycles: 2400 Head size: Regular   Patient tolerated procedure well without immediate complications.   The right iliopsoas and anterior hip flexors was targeted for Extracorporeal shockwave therapy.  Preset: Muscle Injury Power Level: 120 mJ Frequency: 12 Hz Impulse/cycles: 2200 Head size: Regular   Patient tolerated procedure well without immediate complications.       Clinical History: No specialty comments available.  She reports that she quit smoking about 40 years ago. Her smoking use included cigarettes. She started smoking about 50 years ago. She has a 10 pack-year smoking  history. She has never used smokeless tobacco.  Recent Labs    07/23/22 0936 03/23/23 1012  HGBA1C 6.1* 6.5*    Objective:   Vital Signs: BP 114/73   Pulse 85   Ht 5\' 4"  (1.626 m)   Wt 206 lb (93.4 kg)   BMI 35.36 kg/m   Physical Exam  Gen: Well-appearing, in no acute distress; non-toxic CV: Well-perfused. Warm.  Resp: Breathing unlabored on room air; no wheezing. Psych: Fluid speech in conversation; appropriate affect; normal thought process Neuro: Sensation intact throughout. No gross coordination deficits.   Ortho Exam - Bilateral hips: + Mild TTP over the greater trochanteric region and gluteal insertion on the right hip, some mild TTP in the mid aspect of the IT band, improved tightness.  Well-healed scar from prior hip replacement.  Left hip moves well without pain, there is about 10 degrees less of internal and external rotation with logroll.   Imaging: No results found.  Past Medical/Family/Surgical/Social History: Medications & Allergies reviewed per EMR, new medications updated. Patient Active Problem List   Diagnosis Date Noted   Unilateral primary osteoarthritis, left hip 05/11/2023   Depression 03/16/2023   Allergic rhinitis 12/29/2022   Seasonal affective disorder (HCC) 12/01/2022   BMI 34.0-34.9,adult 12/01/2022   Obesity, Beginning BMI 37.59 12/01/2022   OA (osteoarthritis) of knee 08/03/2022   Status post total right knee replacement 08/03/2022   Stress 07/19/2022   Other constipation 07/19/2022   Atypical chest pain 06/25/2021   Class 2 severe obesity with serious comorbidity and body mass index (BMI) of 37.0 to 37.9 in adult (HCC) 06/03/2021   Vitamin D deficiency 06/03/2021   Other fatigue 04/22/2021   SOB (shortness of breath) on exertion 04/22/2021   Lower extremity edema 03/05/2021   Coronary artery calcification 02/20/2020   Pure hypercholesterolemia 02/20/2020   Unilateral primary osteoarthritis, right knee 01/03/2019   Genetic testing  12/07/2018   Family history of breast cancer    Family history of prostate cancer    Family history of colon cancer    Family history of kidney cancer    Malignant neoplasm of upper-outer quadrant of right breast in female, 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/09/2018   Diabetic neuropathy, type II diabetes mellitus (HCC) 11/09/2018   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/02/2017   Trochanteric bursitis, right hip 05/10/2017   Status post total replacement of right hip 11/26/2016   DDD (degenerative disc disease), 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   Low back pain 04/23/2015   Hiatal hernia 04/13/2013   Diabetes mellitus (HCC) 01/26/2013   Umbilical hernia 07/24/2012   Essential hypertension    Past Medical History:  Diagnosis Date   Anxiety    Arthritis    Back, Hip- right   Atypical chest pain 06/25/2021   Back pain    Cancer (HCC)    right breast   Cataract    Colitis    Coronary artery calcification 02/20/2020   Diabetes mellitus without complication (HCC)    Type II   Edema 03/05/2021   Edema of both lower extremities    Endometriosis    Family history of  adverse reaction to anesthesia    Father - N/V - blood pressure; daughter N/V   Family history of breast cancer    Family history of colon cancer    Family history of kidney cancer    Family history of prostate cancer    Fatty liver    GERD (gastroesophageal reflux disease)    History of hiatal hernia    History of kidney stones    History of ulcerative colitis    Hypertension    Insomnia    Joint pain    Kidney problem    Lower extremity edema 03/05/2021   Lumbar disc disease    L4 L5   Neuropathy    Osteoarthritis    Other hyperlipidemia    Palpitations    Personal history of radiation therapy    Pneumonia    1983, 2003   Pure hypercholesterolemia 02/20/2020   PVCs (premature ventricular contractions)    benign   Umbilical hernia    Vitamin D deficiency    Wears glasses    Family History  Problem  Relation Age of Onset   Hyperlipidemia Mother    Diabetes Mother    Hypertension Mother    Parkinson's disease Mother    Kidney disease Mother    Kidney disease Father    Stroke Father    Heart failure Father    Hypertension Father    Heart disease Father    Diabetes Father    Cancer Father        colon, prostate, and kidney   Heart disease Maternal Grandmother    Diabetes Maternal Grandmother    Heart disease Maternal Grandfather    Diabetes Maternal Grandfather    Cancer Maternal Grandfather        pt unaware of what kind   Stroke Paternal Grandmother    Stroke Paternal Grandfather    Cancer Maternal Aunt        cervical vs ovarian   Colon cancer Paternal Aunt        dx less than 50   Breast cancer Paternal Aunt        dx in her 90s   Prostate cancer Paternal Uncle    Breast cancer Cousin        pat first cousin, dx over 32   Prostate cancer Other        PGF's father   Past Surgical History:  Procedure Laterality Date   APPENDECTOMY     BREAST BIOPSY  08-13-2009  DR TSUEI   EXCISION LEFT NIPPLE DUCT   BREAST EXCISIONAL BIOPSY Left    x2   BREAST EXCISIONAL BIOPSY Right    BREAST LUMPECTOMY Right 11/16/2018   invasive ductal   CHOLECYSTECTOMY  1992   COLON RESECTION  1986   ULCERATIVE COLITIS   COLON SURGERY     ESOPHAGEAL MANOMETRY N/A 06/11/2013   Procedure: ESOPHAGEAL MANOMETRY (EM);  Surgeon: Petra Kuba, MD;  Location: [facility];  Service: Endoscopy;  Laterality: N/A;   ESOPHAGOGASTRODUODENOSCOPY (EGD) WITH PROPOFOL N/A 05/31/2016   Procedure: ESOPHAGOGASTRODUODENOSCOPY (EGD) WITH PROPOFOL;  Surgeon: Vida Rigger, MD;  Location: [facility] ENDOSCOPY;  Service: Endoscopy;  Laterality: N/A;   EXCISION RIGHT BREAST MASS   10-20-2005  DR Theron Arista YOUNG   EYE SURGERY Bilateral 2023   cataract   FRACTURE SURGERY     left arm   kidney stone removed  12/2009   KNEE ARTHROSCOPY Left    MCL   LEFT THUMB CARPOMETACARPAL JOINT SUSPENSIONPLASTY  04-06-2006  DR Mina Marble    LEFT WRIST ARTHROSCOPY W/ DEBRIDEMENT AND REMOVAL CYST  03-26-2004  DR Mina Marble   MYOMECTOMY     RADIOACTIVE SEED GUIDED PARTIAL MASTECTOMY WITH AXILLARY SENTINEL LYMPH NODE BIOPSY Right 11/16/2018   Procedure: RIGHT BREAST RADIOACTIVE SEED GUIDED PARTIAL MASTECTOMY WITH  SENTINEL  NODE BIOPSY;  Surgeon: Abigail Miyamoto, MD;  Location: [facility] OR;  Service: General;  Laterality: Right;   RIGHT COLECTOMY     RIGHT URETEROSCOPIC STONE EXTRACTION  12-11-2009  DR Jonny Ruiz [facility]   TENDON RELEASE     TONSILLECTOMY     TOTAL HIP ARTHROPLASTY Right 11/26/2016   Procedure: RIGHT TOTAL HIP ARTHROPLASTY ANTERIOR APPROACH;  Surgeon: Kathryne Hitch, MD;  Location: [facility] ORS;  Service: Orthopedics;  Laterality: Right;   TOTAL KNEE ARTHROPLASTY Left 12/02/2017   Procedure: LEFT TOTAL KNEE ARTHROPLASTY;  Surgeon: Kathryne Hitch, MD;  Location: [facility];  Service: Orthopedics;  Laterality: Left;   TOTAL KNEE ARTHROPLASTY Right 08/03/2022   Procedure: RIGHT TOTAL KNEE ARTHROPLASTY;  Surgeon: Kathryne Hitch, MD;  Location: [facility] OR;  Service: Orthopedics;  Laterality: Right;   WRIST SURGERY     both   Social History   Occupational History   Occupation: Retired  Tobacco Use   Smoking status: Former    Current packs/day: 0.00    Average packs/day: 1 pack/day for 10.0 years (10.0 ttl pk-yrs)    Types: Cigarettes    Start date: 09/22/1972    Quit date: 09/22/1982    Years since quitting: 40.7   Smokeless tobacco: Never   Tobacco comments:    quit 1983  Vaping Use   Vaping status: Never Used  Substance and Sexual Activity   Alcohol use: No   Drug use: No   Sexual activity: Yes    Partners: Male    Birth control/protection: Post-menopausal

## 2023-06-07 ENCOUNTER — Other Ambulatory Visit (INDEPENDENT_AMBULATORY_CARE_PROVIDER_SITE_OTHER): Payer: Self-pay | Admitting: Family Medicine

## 2023-06-07 DIAGNOSIS — E1169 Type 2 diabetes mellitus with other specified complication: Secondary | ICD-10-CM

## 2023-06-08 ENCOUNTER — Ambulatory Visit (INDEPENDENT_AMBULATORY_CARE_PROVIDER_SITE_OTHER): Payer: PPO | Admitting: Family Medicine

## 2023-06-08 ENCOUNTER — Encounter (INDEPENDENT_AMBULATORY_CARE_PROVIDER_SITE_OTHER): Payer: Self-pay | Admitting: Family Medicine

## 2023-06-08 VITALS — BP 125/74 | HR 73 | Temp 97.9°F | Ht 64.0 in | Wt 202.0 lb

## 2023-06-08 DIAGNOSIS — E7849 Other hyperlipidemia: Secondary | ICD-10-CM

## 2023-06-08 DIAGNOSIS — Z7985 Long-term (current) use of injectable non-insulin antidiabetic drugs: Secondary | ICD-10-CM | POA: Diagnosis not present

## 2023-06-08 DIAGNOSIS — Z6834 Body mass index (BMI) 34.0-34.9, adult: Secondary | ICD-10-CM

## 2023-06-08 DIAGNOSIS — E559 Vitamin D deficiency, unspecified: Secondary | ICD-10-CM | POA: Diagnosis not present

## 2023-06-08 DIAGNOSIS — F3289 Other specified depressive episodes: Secondary | ICD-10-CM

## 2023-06-08 DIAGNOSIS — E669 Obesity, unspecified: Secondary | ICD-10-CM

## 2023-06-08 DIAGNOSIS — E1169 Type 2 diabetes mellitus with other specified complication: Secondary | ICD-10-CM | POA: Diagnosis not present

## 2023-06-08 MED ORDER — ESCITALOPRAM OXALATE 20 MG PO TABS: 20.0000 mg | ORAL_TABLET | Freq: Every day | ORAL | 0 refills | Status: DC

## 2023-06-08 MED ORDER — VITAMIN D (ERGOCALCIFEROL) 1.25 MG (50000 UNIT) PO CAPS: 50000.0000 [IU] | ORAL_CAPSULE | ORAL | 0 refills | Status: DC

## 2023-06-08 MED ORDER — ROSUVASTATIN CALCIUM 20 MG PO TABS: 20.0000 mg | ORAL_TABLET | Freq: Every day | ORAL | 0 refills | Status: DC

## 2023-06-08 MED ORDER — TIRZEPATIDE 2.5 MG/0.5ML ~~LOC~~ SOAJ
2.5000 mg | SUBCUTANEOUS | 0 refills | Status: DC
Start: 2023-06-08 — End: 2023-07-06

## 2023-06-08 NOTE — Progress Notes (Unsigned)
Chief Complaint:   OBESITY Lauren [name] is here to discuss her progress with her obesity treatment plan along with follow-up of her obesity related diagnoses. Lauren [name] is on the Category 2 Plan and states she is following her eating plan approximately 65% of the time. Lauren [name] states she is doing 0 minutes 0 times per week.  Today's visit was #: 27 Starting weight: 219 lbs Starting date: 04/22/2021 Today's weight: 202 lbs Today's date: 06/08/2023 Total lbs lost to date: 17 Total lbs lost since last in-office visit: 8  Interim History: Patient has done well with her weight loss over the last 2 months.  She is being mindful of her food choices and she is working on meeting her protein goals.  Her hunger is mostly controlled.  Subjective:   1. Other hyperlipidemia Patient has decreased cholesterol in her diet, and she is due to have her labs rechecked.  She is still on Crestor 20 mg.  2. Vitamin D deficiency Patient is on vitamin D prescription, and she denies nausea, vomiting, or muscle weakness.  She is due to have her labs rechecked.  3. Type 2 diabetes mellitus with other specified complication, without long-term current use of insulin (HCC) Patient has had increased GI upset with increased dose of Mounjaro.  She notes increased indigestion as well.  4. Emotional Eating Behavior Patient is doing well on Lexapro.  She is mindful of her emotional eating behavior and she has done well with her weight loss.  Assessment/Plan:   1. Other hyperlipidemia We will check labs today.  Patient will continue with her diet and Crestor, and we will refill Crestor 20 mg once daily for 90 days.  - Lipid Panel With LDL/HDL Ratio - rosuvastatin (CRESTOR) 20 MG tablet; Take 1 tablet (20 mg total) by mouth daily.  Dispense: 90 tablet; Refill: 0  2. Vitamin D deficiency We will check labs today.  Patient will continue prescription vitamin D once weekly, we will refill for 1 month.  - Vitamin D,  Ergocalciferol, (DRISDOL) 1.25 MG (50000 UNIT) CAPS capsule; Take 1 capsule (50,000 Units total) by mouth every 7 (seven) days.  Dispense: 4 capsule; Refill: 0 - VITAMIN D 25 Hydroxy (Vit-D Deficiency, Fractures)  3. Type 2 diabetes mellitus with other specified complication, without long-term current use of insulin (HCC) We will check labs today.  Patient agreed to decrease Mounjaro to 2.5 mg once weekly, and we will refill for 1 month.  We will follow-up at her next visit.  - CMP14+EGFR - Insulin, random - Hemoglobin A1c - Vitamin B12 - tirzepatide (MOUNJARO) 2.5 MG/0.5ML Pen; Inject 2.5 mg into the skin once a week.  Dispense: 2 mL; Refill: 0  4. Emotional Eating Behavior Patient will continue Lexapro 20 mg once daily, and we will refill for 1 month.  - escitalopram (LEXAPRO) 20 MG tablet; Take 1 tablet (20 mg total) by mouth daily.  Dispense: 30 tablet; Refill: 0  5. BMI 34.0-34.9,adult  6. Obesity, Beginning BMI 37.59 Lauren [name] is currently in the action stage of change. As such, her goal is to continue with weight loss efforts. She has agreed to the Category 2 Plan.   Behavioral modification strategies: meal planning and cooking strategies.  Lauren [name] has agreed to follow-up with our clinic in 4 weeks. She was informed of the importance of frequent follow-up visits to maximize her success with intensive lifestyle modifications for her multiple health conditions.   Lauren [name] was informed we would discuss her lab results at her  next visit unless there is a critical issue that needs to be addressed sooner. Lauren [name] agreed to keep her next visit at the agreed upon time to discuss these results.  Objective:   Blood pressure 125/74, pulse 73, temperature 97.9 F (36.6 C), height 5\' 4"  (1.626 m), weight 202 lb (91.6 kg), SpO2 94%. Body mass index is 34.67 kg/m.  Lab Results  Component Value Date   CREATININE 1.11 (H) 08/04/2022   BUN 16 08/04/2022   NA 135 08/04/2022   K 3.8  08/04/2022   CL 101 08/04/2022   CO2 24 08/04/2022   Lab Results  Component Value Date   ALT 12 06/08/2022   AST 16 06/08/2022   ALKPHOS 50 06/08/2022   BILITOT 0.4 06/08/2022   Lab Results  Component Value Date   HGBA1C 6.5 (H) 03/23/2023   HGBA1C 6.1 (H) 07/23/2022   HGBA1C 6.1 (H) 05/11/2022   HGBA1C 5.9 (H) 12/10/2021   HGBA1C 6.7 (H) 04/22/2021   Lab Results  Component Value Date   INSULIN 11.1 05/11/2022   INSULIN 13.3 12/10/2021   INSULIN 22.3 04/22/2021   Lab Results  Component Value Date   TSH 2.150 05/11/2022   Lab Results  Component Value Date   CHOL 128 05/11/2022   HDL 56 05/11/2022   LDLCALC 56 05/11/2022   TRIG 83 05/11/2022   Lab Results  Component Value Date   VD25OH 55.8 12/01/2022   VD25OH 69.3 05/11/2022   VD25OH 86.9 12/10/2021   Lab Results  Component Value Date   WBC 7.8 08/04/2022   HGB 9.5 (L) 08/04/2022   HCT 27.9 (L) 08/04/2022   MCV 88.9 08/04/2022   PLT 205 08/04/2022   No results found for: "IRON", "TIBC", "FERRITIN"  Attestation Statements:   Reviewed by clinician on day of visit: allergies, medications, problem list, medical history, surgical history, family history, social history, and previous encounter notes.   I, Burt Knack, am acting as transcriptionist for [name], MD.  I have reviewed the above documentation for accuracy and completeness, and I agree with the above. -  [name], MD

## 2023-06-09 ENCOUNTER — Ambulatory Visit: Payer: HMO | Admitting: Hematology and Oncology

## 2023-06-09 LAB — CMP14+EGFR
ALT: 16 IU/L (ref 0–32)
AST: 19 IU/L (ref 0–40)
Albumin: 4.3 g/dL (ref 3.8–4.8)
Alkaline Phosphatase: 58 IU/L (ref 44–121)
BUN/Creatinine Ratio: 15 (ref 12–28)
BUN: 14 mg/dL (ref 8–27)
Bilirubin Total: 0.4 mg/dL (ref 0.0–1.2)
CO2: 24 mmol/L (ref 20–29)
Calcium: 9.5 mg/dL (ref 8.7–10.3)
Chloride: 91 mmol/L — ABNORMAL LOW (ref 96–106)
Creatinine, Ser: 0.91 mg/dL (ref 0.57–1.00)
Globulin, Total: 2.4 g/dL (ref 1.5–4.5)
Glucose: 89 mg/dL (ref 70–99)
Potassium: 3.7 mmol/L (ref 3.5–5.2)
Sodium: 132 mmol/L — ABNORMAL LOW (ref 134–144)
Total Protein: 6.7 g/dL (ref 6.0–8.5)
eGFR: 65 mL/min/1.73

## 2023-06-09 LAB — LIPID PANEL WITH LDL/HDL RATIO
Cholesterol, Total: 123 mg/dL (ref 100–199)
HDL: 67 mg/dL
LDL Chol Calc (NIH): 41 mg/dL (ref 0–99)
LDL/HDL Ratio: 0.6 ratio (ref 0.0–3.2)
Triglycerides: 75 mg/dL (ref 0–149)
VLDL Cholesterol Cal: 15 mg/dL (ref 5–40)

## 2023-06-09 LAB — INSULIN, RANDOM: INSULIN: 25.6 u[IU]/mL — ABNORMAL HIGH (ref 2.6–24.9)

## 2023-06-09 LAB — HEMOGLOBIN A1C
Est. average glucose Bld gHb Est-mCnc: 126 mg/dL
Hgb A1c MFr Bld: 6 % — ABNORMAL HIGH (ref 4.8–5.6)

## 2023-06-09 LAB — VITAMIN D 25 HYDROXY (VIT D DEFICIENCY, FRACTURES): Vit D, 25-Hydroxy: 100 ng/mL (ref 30.0–100.0)

## 2023-06-13 ENCOUNTER — Ambulatory Visit: Payer: PPO | Admitting: Sports Medicine

## 2023-06-13 ENCOUNTER — Encounter: Payer: Self-pay | Admitting: Sports Medicine

## 2023-06-13 DIAGNOSIS — M1612 Unilateral primary osteoarthritis, left hip: Secondary | ICD-10-CM

## 2023-06-13 DIAGNOSIS — M25551 Pain in right hip: Secondary | ICD-10-CM

## 2023-06-13 DIAGNOSIS — M7631 Iliotibial band syndrome, right leg: Secondary | ICD-10-CM

## 2023-06-13 NOTE — Progress Notes (Signed)
Lauren [name] - 77 y.o. female MRN [id number]  Date of birth: 1946/02/24  Office Visit Note: Visit Date: 06/13/2023 PCP: Daisy Floro, MD Referred by: Daisy Floro, MD  Subjective: No chief complaint on file.  HPI: Lauren [name] is a pleasant 77 y.o. female who presents today for follow-up of right iliopsoas and lateral hip pain.   Left hip OA -had ultrasound-guided intra-articular injection about 2 weeks ago, this is still giving her good relief.  At some point knows she is likely needing to have hip replacement.  Right hip -had returned back to doing shockwave treatment for the iliopsoas as well as the lateral hip, did perform this last week and noting improvement.  She has stopped her home hip exercises and only been doing stretching for the IT band as this settles down.  Continues on her hydrocodone/acetaminophen 10-325 mg 3 times daily.  Pertinent ROS were reviewed with the patient and found to be negative unless otherwise specified above in HPI.   Assessment & Plan: Visit Diagnoses:  1. Greater trochanteric pain syndrome of right lower extremity   2. It band syndrome, right   3. Unilateral primary osteoarthritis, left hip    Plan: [name] is still having a positive benefit from her ultrasound-guided corticosteroid injection into the left hip with known severe OA.  She can continue her home exercises, stretching and we can repeat these infrequently.  She will let me know if she would ultimately like to have hip replacement.  In terms of her right hip, this is settling down with extracorporeal shockwave therapy.  We did repeat a treatment today for the iliopsoas/hip flexors as well as the lateral trochanter and associated gluteal insertion.  I would like her to hold off from her home hip stabilization exercises for 1 additional week and only continue IT band stretching.  She will follow-up next week for repeat treatment and evaluation.  At that point if she  continues to improve we will likely get her started back on her HEP.  She will continue her hydrocodone-acetaminophen 10-325 mg 3 times daily for her chronic pain management. F/u in 1 week.  Follow-up: Return in about 1 week (around 06/20/2023) for R-ant and lateral hip (reg visit).   Meds & Orders: No orders of the defined types were placed in this encounter.  No orders of the defined types were placed in this encounter.    Procedures: Procedure: ECSWT Indications:  IT-band syndrome, GT syndrome, and iliopsoas tendinopathy/bursitis   Procedure Details Consent: Risks of procedure as well as the alternatives and risks of each were explained to the patient.  Verbal consent for procedure obtained. Time Out: Verified patient identification, verified procedure, site was marked, verified correct patient position. The area was cleaned with alcohol swab.     The right greater trochanter and IT-band was targeted for Extracorporeal shockwave therapy.    Preset: GT bursitits/IT-band Power Level: 120 mJ Frequency: 12 Hz Impulse/cycles: 2200 Head size: Regular   Patient tolerated procedure well without immediate complications.   The right iliopsoas and anterior hip flexors was targeted for Extracorporeal shockwave therapy.    Preset: Muscle Injury Power Level: 120 mJ Frequency: 12 Hz Impulse/cycles: 2200 Head size: Regular   Patient tolerated procedure well without immediate complications.        Clinical History: No specialty comments available.  She reports that she quit smoking about 40 years ago. Her smoking use included cigarettes. She started smoking about 50 years ago. She has  a 10 pack-year smoking history. She has never used smokeless tobacco.  Recent Labs    07/23/22 0936 03/23/23 1012 06/08/23 0941  HGBA1C 6.1* 6.5* 6.0*    Objective:   Vital Signs: There were no vitals taken for this visit.  Physical Exam  Gen: Well-appearing, in no acute distress; non-toxic CV:  Well-perfused. Warm.  Resp: Breathing unlabored on room air; no wheezing. Psych: Fluid speech in conversation; appropriate affect; normal thought process Neuro: Sensation intact throughout. No gross coordination deficits.   Ortho Exam - Bilateral hips: No bony TTP over the left hip.  Mild TTP over the greater trochanter on the right hip, no gluteal insertion pain today.  Improved tightness of the IT band with minimal tenderness to palpation.  Well-healed surgical incision from prior right hip THA.  Imaging: No results found.  Past Medical/Family/Surgical/Social History: Medications & Allergies reviewed per EMR, new medications updated. 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/03/2022   Stress 07/19/2022   Other constipation 07/19/2022   Atypical chest pain 06/25/2021   Class 2 severe obesity with serious comorbidity and body mass index (BMI) of 37.0 to 37.9 in adult (HCC) 06/03/2021   Vitamin D deficiency 06/03/2021   Other fatigue 04/22/2021   SOB (shortness of breath) on exertion 04/22/2021   Lower extremity edema 03/05/2021   Coronary artery calcification 02/20/2020   Pure hypercholesterolemia 02/20/2020   Unilateral primary osteoarthritis, right knee 01/03/2019   Genetic testing 12/07/2018   Family history of breast cancer    Family history of prostate cancer    Family history of colon cancer    Family history of kidney cancer    Malignant neoplasm of upper-outer quadrant of right breast in female, 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/09/2018   Diabetic neuropathy, type II diabetes mellitus (HCC) 11/09/2018    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/02/2017   Trochanteric bursitis, right hip 05/10/2017   Status post total replacement of right hip 11/26/2016   DDD (degenerative disc disease), 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   Low back pain 04/23/2015   Hiatal hernia 04/13/2013   Diabetes mellitus (HCC) 01/26/2013   Umbilical hernia 07/24/2012   Essential hypertension    Past Medical History:  Diagnosis Date   Anxiety    Arthritis    Back, Hip- right   Atypical chest pain 06/25/2021   Back pain    Cancer (HCC)    right breast   Cataract    Colitis    Coronary artery calcification 02/20/2020   Diabetes mellitus without complication (HCC)    Type II   Edema 03/05/2021   Edema of both lower extremities    Endometriosis    Family history of adverse reaction to anesthesia    Father - N/V - blood pressure; daughter N/V   Family history of breast cancer    Family history of colon cancer    Family history of kidney cancer    Family history of prostate cancer    Fatty liver    GERD (gastroesophageal reflux disease)    History of hiatal hernia    History of kidney stones    History of ulcerative colitis    Hypertension  Insomnia    Joint pain    Kidney problem    Lower extremity edema 03/05/2021   Lumbar disc disease    L4 L5   Neuropathy    Osteoarthritis    Other hyperlipidemia    Palpitations    Personal history of radiation therapy    Pneumonia    1983, 2003   Pure hypercholesterolemia 02/20/2020   PVCs (premature ventricular contractions)    benign   Umbilical hernia    Vitamin D deficiency    Wears glasses    Family History  Problem Relation Age of Onset   Hyperlipidemia Mother    Diabetes Mother    Hypertension Mother    Parkinson's disease Mother    Kidney disease Mother    Kidney disease Father    Stroke Father    Heart failure Father    Hypertension Father    Heart disease Father    Diabetes Father    Cancer Father        colon, prostate, and kidney    Heart disease Maternal Grandmother    Diabetes Maternal Grandmother    Heart disease Maternal Grandfather    Diabetes Maternal Grandfather    Cancer Maternal Grandfather        pt unaware of what kind   Stroke Paternal Grandmother    Stroke Paternal Grandfather    Cancer Maternal Aunt        cervical vs ovarian   Colon cancer Paternal Aunt        dx less than 50   Breast cancer Paternal Aunt        dx in her 55s   Prostate cancer Paternal Uncle    Breast cancer Cousin        pat first cousin, dx over 80   Prostate cancer Other        PGF's father   Past Surgical History:  Procedure Laterality Date   APPENDECTOMY     BREAST BIOPSY  08-13-2009  DR TSUEI   EXCISION LEFT NIPPLE DUCT   BREAST EXCISIONAL BIOPSY Left    x2   BREAST EXCISIONAL BIOPSY Right    BREAST LUMPECTOMY Right 11/16/2018   invasive ductal   CHOLECYSTECTOMY  1992   COLON RESECTION  1986   ULCERATIVE COLITIS   COLON SURGERY     ESOPHAGEAL MANOMETRY N/A 06/11/2013   Procedure: ESOPHAGEAL MANOMETRY (EM);  Surgeon: Petra Kuba, MD;  Location: [facility];  Service: Endoscopy;  Laterality: N/A;   ESOPHAGOGASTRODUODENOSCOPY (EGD) WITH PROPOFOL N/A 05/31/2016   Procedure: ESOPHAGOGASTRODUODENOSCOPY (EGD) WITH PROPOFOL;  Surgeon: Vida Rigger, MD;  Location: [facility];  Service: Endoscopy;  Laterality: N/A;   EXCISION RIGHT BREAST MASS   10-20-2005  DR Theron Arista YOUNG   EYE SURGERY Bilateral 2023   cataract   FRACTURE SURGERY     left arm   kidney stone removed  12/2009   KNEE ARTHROSCOPY Left    MCL   LEFT THUMB CARPOMETACARPAL JOINT SUSPENSIONPLASTY  04-06-2006  DR Mina Marble   LEFT WRIST ARTHROSCOPY W/ DEBRIDEMENT AND REMOVAL CYST  03-26-2004  DR Mina Marble   MYOMECTOMY     RADIOACTIVE SEED GUIDED PARTIAL MASTECTOMY WITH AXILLARY SENTINEL LYMPH NODE BIOPSY Right 11/16/2018   Procedure: RIGHT BREAST RADIOACTIVE SEED GUIDED PARTIAL MASTECTOMY WITH  SENTINEL  NODE BIOPSY;  Surgeon: Abigail Miyamoto, MD;  Location:  [facility] OR;  Service: General;  Laterality: Right;   RIGHT COLECTOMY     RIGHT URETEROSCOPIC STONE EXTRACTION  12-11-2009  DR Jonny Ruiz  [facility]   TENDON RELEASE     TONSILLECTOMY     TOTAL HIP ARTHROPLASTY Right 11/26/2016   Procedure: RIGHT TOTAL HIP ARTHROPLASTY ANTERIOR APPROACH;  Surgeon: Kathryne Hitch, MD;  Location: [facility];  Service: Orthopedics;  Laterality: Right;   TOTAL KNEE ARTHROPLASTY Left 12/02/2017   Procedure: LEFT TOTAL KNEE ARTHROPLASTY;  Surgeon: Kathryne Hitch, MD;  Location: [facility];  Service: Orthopedics;  Laterality: Left;   TOTAL KNEE ARTHROPLASTY Right 08/03/2022   Procedure: RIGHT TOTAL KNEE ARTHROPLASTY;  Surgeon: Kathryne Hitch, MD;  Location: [facility] OR;  Service: Orthopedics;  Laterality: Right;   WRIST SURGERY     both   Social History   Occupational History   Occupation: Retired  Tobacco Use   Smoking status: Former    Current packs/day: 0.00    Average packs/day: 1 pack/day for 10.0 years (10.0 ttl pk-yrs)    Types: Cigarettes    Start date: 09/22/1972    Quit date: 09/22/1982    Years since quitting: 40.7   Smokeless tobacco: Never   Tobacco comments:    quit 1983  Vaping Use   Vaping status: Never Used  Substance and Sexual Activity   Alcohol use: No   Drug use: No   Sexual activity: Yes    Partners: Male    Birth control/protection: Post-menopausal

## 2023-06-21 ENCOUNTER — Ambulatory Visit: Payer: PPO | Admitting: Sports Medicine

## 2023-06-21 ENCOUNTER — Encounter: Payer: Self-pay | Admitting: Sports Medicine

## 2023-06-21 DIAGNOSIS — M25551 Pain in right hip: Secondary | ICD-10-CM

## 2023-06-21 DIAGNOSIS — M7631 Iliotibial band syndrome, right leg: Secondary | ICD-10-CM | POA: Diagnosis not present

## 2023-06-21 DIAGNOSIS — M1612 Unilateral primary osteoarthritis, left hip: Secondary | ICD-10-CM

## 2023-06-21 DIAGNOSIS — Z96641 Presence of right artificial hip joint: Secondary | ICD-10-CM

## 2023-06-21 NOTE — Progress Notes (Signed)
Lauren [name] - 77 y.o. female MRN [id number]  Date of birth: 1946/05/07  Office Visit Note: Visit Date: 06/21/2023 PCP: Daisy Floro, MD Referred by: Daisy Floro, MD  Subjective: Chief Complaint  Patient presents with   Right Hip - Follow-up   HPI: Lauren [name] is a pleasant 77 y.o. female who presents today for bilateral hip pain.  Left hip -has known advanced osteoarthritis.  Did proceed with ultrasound-guided intra-articular hip injection about 1 month ago.  Still having good benefit from this but is starting to have more pain within the groin.  She is likely planning for hip replacement with Dr. Magnus Ivan in January 2025.  Right hip -did have a near fall when her dog ran under her legs last week.  Tweaked the hip but overall still making good improvements from shockwave therapy she had stopped her home exercises but as her pain is settling down, we discussed getting back into days.  She continues on her chronic hydrocodone/acetaminophen 10-325 mg 3 times daily.  Pertinent ROS were reviewed with the patient and found to be negative unless otherwise specified above in HPI.   Assessment & Plan: Visit Diagnoses:  1. Greater trochanteric pain syndrome of right lower extremity   2. It band syndrome, right   3. Unilateral primary osteoarthritis, left hip   4. History of right hip replacement    Plan: Discussed with [name] that her left hip is still having benefit from the ultrasound-guided corticosteroid injection, but the pain that is radiating into her groin and worse with getting in and out of the car, etc. is coming from her advanced osteoarthritis of the left hip.  She is planning for likely hip arthroplasty in January 2025, we could consider a repeat intra-articular injection in another 6+ weeks to try to bridge the gap until this time.  Terms of the right hip with the iliopsoas and the lateral trochanter, we did repeat extracorporeal shockwave therapy and she  is making good improvements from this.  Given that her pain is improving, I would like her to restart her home hip stabilization exercises for both the right and the left hip.  She will continue her chronic hydrocodone-acetaminophen 10-325 mg 3 times daily for pain.  She will follow-up in 1 week for reevaluation and repeat shockwave therapy, we discussed after this visit likely taking at least a 1 month break to see between the did benefit she is receiving from these treatments.  Follow-up: Return in about 1 week (around 06/28/2023) for R-hip (reg visit).   Meds & Orders: No orders of the defined types were placed in this encounter.  No orders of the defined types were placed in this encounter.    Procedures: Procedure: ECSWT Indications:  IT-band syndrome, GT syndrome, and iliopsoas tendinopathy/bursitis   Procedure Details Consent: Risks of procedure as well as the alternatives and risks of each were explained to the patient.  Verbal consent for procedure obtained. Time Out: Verified patient identification, verified procedure, site was marked, verified correct patient position. The area was cleaned with alcohol swab.     The right greater trochanter and IT-band was targeted for Extracorporeal shockwave therapy.    Preset: GT bursitits/IT-band Power Level: 120 mJ Frequency: 12 Hz Impulse/cycles: 2200 Head size: Regular   Patient tolerated procedure well without immediate complications.   The right iliopsoas and anterior hip flexors was targeted for Extracorporeal shockwave therapy.    Preset: Muscle Injury Power Level: 120 mJ Frequency: 12 Hz Impulse/cycles:  2200 Head size: Regular   Patient tolerated procedure well without immediate complications.        Clinical History: No specialty comments available.  She reports that she quit smoking about 40 years ago. Her smoking use included cigarettes. She started smoking about 50 years ago. She has a 10 pack-year smoking history. She  has never used smokeless tobacco.  Recent Labs    07/23/22 0936 03/23/23 1012 06/08/23 0941  HGBA1C 6.1* 6.5* 6.0*    Objective:   Vital Signs: There were no vitals taken for this visit.  Physical Exam  Gen: Well-appearing, in no acute distress; non-toxic CV: Well-perfused. Warm.  Resp: Breathing unlabored on room air; no wheezing. Psych: Fluid speech in conversation; appropriate affect; normal thought process Neuro: Sensation intact throughout. No gross coordination deficits.   Ortho Exam - Bilateral hips: Very trivial TTP over the greater trochanter on the right hip, but much improved from previous visits.  There is a well-healed surgical incision from prior right hip THA without signs of infection.  The left hip has pain with internal rotation, positive FADIR and Stinchfield test.  Imaging:  MR Hip Right w/o contrast CLINICAL DATA:  Right hip replacement. Chronic left hip pain. Right hip pain for 4 months.  EXAM: MR OF THE RIGHT HIP WITHOUT CONTRAST  MR OF THE LEFT HIP WITHOUT CONTRAST  TECHNIQUE: Multiplanar, multisequence MR imaging of the right hip was performed. No intravenous contrast was administered.  Multiplanar, multisequence MR imaging of the left hip was performed. No intravenous contrast was administered.  COMPARISON:  None Available.  FINDINGS: Bones:  Right total hip arthroplasty with susceptibility artifact partially obscuring the adjacent soft tissue and osseous structures. No periarticular fluid collection or osteolysis. No acute fracture or dislocation.  No left hip fracture, dislocation or avascular necrosis.  No periosteal reaction or bone destruction. No aggressive osseous lesion.  Normal sacrum and sacroiliac joints. No SI joint widening or erosive changes.  Articular cartilage and labrum  Articular cartilage: High-grade partial-thickness cartilage loss with areas of full-thickness cartilage loss of the left femoral head and  acetabulum and subchondral reactive marrow edema in the left femoral head.  Labrum:  Right anterior labral tear.  Joint or bursal effusion  Joint effusion: Moderate left hip joint effusion. No SI joint effusion.  Bursae:  No bursal fluid.  Muscles and tendons  Flexors: Normal.  Extensors: Normal.  Abductors: Normal.  Adductors: Normal.  Gluteals: Atrophy of the left gluteus minimus muscle. Chronic tear of the left gluteus minimus tendon insertion.  Hamstrings: Small partial-thickness tear of the hamstring origin bilaterally.  Other findings  No pelvic free fluid. No fluid collection or hematoma. No inguinal lymphadenopathy. No inguinal hernia.  IMPRESSION: 1. Right total hip arthroplasty without periarticular fluid collection or osteolysis. No hardware failure or complication. 2. Severe osteoarthritis of the left hip. Moderate left hip joint effusion. 3. Small partial-thickness tear of the hamstring origin bilaterally.  Electronically Signed   By: Elige Ko M.D.   On: 05/06/2023 10:55 MR Hip Left w/o contrast CLINICAL DATA:  Right hip replacement. Chronic left hip pain. Right hip pain for 4 months.  EXAM: MR OF THE RIGHT HIP WITHOUT CONTRAST  MR OF THE LEFT HIP WITHOUT CONTRAST  TECHNIQUE: Multiplanar, multisequence MR imaging of the right hip was performed. No intravenous contrast was administered.  Multiplanar, multisequence MR imaging of the left hip was performed. No intravenous contrast was administered.  COMPARISON:  None Available.  FINDINGS: Bones:  Right total hip  arthroplasty with susceptibility artifact partially obscuring the adjacent soft tissue and osseous structures. No periarticular fluid collection or osteolysis. No acute fracture or dislocation.  No left hip fracture, dislocation or avascular necrosis.  No periosteal reaction or bone destruction. No aggressive osseous lesion.  Normal sacrum and sacroiliac joints. No SI  joint widening or erosive changes.  Articular cartilage and labrum  Articular cartilage: High-grade partial-thickness cartilage loss with areas of full-thickness cartilage loss of the left femoral head and acetabulum and subchondral reactive marrow edema in the left femoral head.  Labrum:  Right anterior labral tear.  Joint or bursal effusion  Joint effusion: Moderate left hip joint effusion. No SI joint effusion.  Bursae:  No bursal fluid.  Muscles and tendons  Flexors: Normal.  Extensors: Normal.  Abductors: Normal.  Adductors: Normal.  Gluteals: Atrophy of the left gluteus minimus muscle. Chronic tear of the left gluteus minimus tendon insertion.  Hamstrings: Small partial-thickness tear of the hamstring origin bilaterally.  Other findings  No pelvic free fluid. No fluid collection or hematoma. No inguinal lymphadenopathy. No inguinal hernia.  IMPRESSION: 1. Right total hip arthroplasty without periarticular fluid collection or osteolysis. No hardware failure or complication. 2. Severe osteoarthritis of the left hip. Moderate left hip joint effusion. 3. Small partial-thickness tear of the hamstring origin bilaterally.  Electronically Signed   By: Elige Ko M.D.   On: 05/06/2023 10:55    Past Medical/Family/Surgical/Social History: Medications & Allergies reviewed per EMR, new medications updated. 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/03/2022   Stress 07/19/2022   Other constipation 07/19/2022   Atypical chest pain 06/25/2021   Class 2 severe obesity with serious comorbidity and  body mass index (BMI) of 37.0 to 37.9 in adult (HCC) 06/03/2021   Vitamin D deficiency 06/03/2021   Other fatigue 04/22/2021   SOB (shortness of breath) on exertion 04/22/2021   Lower extremity edema 03/05/2021   Coronary artery calcification 02/20/2020   Pure hypercholesterolemia 02/20/2020   Unilateral primary osteoarthritis, right knee 01/03/2019   Genetic testing 12/07/2018   Family history of breast cancer    Family history of prostate cancer    Family history of colon cancer    Family history of kidney cancer    Malignant neoplasm of upper-outer quadrant of right breast in female, 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/09/2018   Diabetic neuropathy, type II diabetes mellitus (HCC) 11/09/2018   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/02/2017   Trochanteric bursitis, right hip 05/10/2017   Status post total replacement of right hip 11/26/2016   DDD (degenerative disc disease), 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   Low back pain 04/23/2015   Hiatal hernia 04/13/2013   Diabetes mellitus (HCC) 01/26/2013   Umbilical hernia 07/24/2012   Essential hypertension    Past Medical History:  Diagnosis Date   Anxiety    Arthritis    Back, Hip- right   Atypical chest pain 06/25/2021   Back pain    Cancer (HCC)    right breast   Cataract    Colitis    Coronary artery calcification 02/20/2020   Diabetes mellitus without complication (HCC)    Type II   Edema 03/05/2021  Edema of both lower extremities    Endometriosis    Family history of adverse reaction to anesthesia    Father - N/V - blood pressure; daughter N/V   Family history of breast cancer    Family history of colon cancer    Family history of kidney cancer    Family history of prostate cancer    Fatty liver    GERD (gastroesophageal reflux disease)    History of hiatal hernia    History of kidney stones    History of ulcerative colitis    Hypertension    Insomnia    Joint pain    Kidney problem    Lower extremity edema  03/05/2021   Lumbar disc disease    L4 L5   Neuropathy    Osteoarthritis    Other hyperlipidemia    Palpitations    Personal history of radiation therapy    Pneumonia    1983, 2003   Pure hypercholesterolemia 02/20/2020   PVCs (premature ventricular contractions)    benign   Umbilical hernia    Vitamin D deficiency    Wears glasses    Family History  Problem Relation Age of Onset   Hyperlipidemia Mother    Diabetes Mother    Hypertension Mother    Parkinson's disease Mother    Kidney disease Mother    Kidney disease Father    Stroke Father    Heart failure Father    Hypertension Father    Heart disease Father    Diabetes Father    Cancer Father        colon, prostate, and kidney   Heart disease Maternal Grandmother    Diabetes Maternal Grandmother    Heart disease Maternal Grandfather    Diabetes Maternal Grandfather    Cancer Maternal Grandfather        pt unaware of what kind   Stroke Paternal Grandmother    Stroke Paternal Grandfather    Cancer Maternal Aunt        cervical vs ovarian   Colon cancer Paternal Aunt        dx less than 50   Breast cancer Paternal Aunt        dx in her 58s   Prostate cancer Paternal Uncle    Breast cancer Cousin        pat first cousin, dx over 16   Prostate cancer Other        PGF's father   Past Surgical History:  Procedure Laterality Date   APPENDECTOMY     BREAST BIOPSY  08-13-2009  DR TSUEI   EXCISION LEFT NIPPLE DUCT   BREAST EXCISIONAL BIOPSY Left    x2   BREAST EXCISIONAL BIOPSY Right    BREAST LUMPECTOMY Right 11/16/2018   invasive ductal   CHOLECYSTECTOMY  1992   COLON RESECTION  1986   ULCERATIVE COLITIS   COLON SURGERY     ESOPHAGEAL MANOMETRY N/A 06/11/2013   Procedure: ESOPHAGEAL MANOMETRY (EM);  Surgeon: Petra Kuba, MD;  Location: [facility];  Service: Endoscopy;  Laterality: N/A;   ESOPHAGOGASTRODUODENOSCOPY (EGD) WITH PROPOFOL N/A 05/31/2016   Procedure: ESOPHAGOGASTRODUODENOSCOPY (EGD) WITH  PROPOFOL;  Surgeon: Vida Rigger, MD;  Location: [facility] END[facility]COPY;  Service: Endoscopy;  Laterality: N/A;   EXCISION RIGHT BREAST MASS   10-20-2005  DR Theron Arista YOUNG   EYE SURGERY Bilateral 2023   cataract   FRACTURE SURGERY     left arm   kidney stone removed  12/2009   KNEE  ARTHROSCOPY Left    MCL   LEFT THUMB CARPOMETACARPAL JOINT SUSPENSIONPLASTY  04-06-2006  DR Mina Marble   LEFT WRIST ARTHROSCOPY W/ DEBRIDEMENT AND REMOVAL CYST  03-26-2004  DR Mina Marble   MYOMECTOMY     RADIOACTIVE SEED GUIDED PARTIAL MASTECTOMY WITH AXILLARY SENTINEL LYMPH NODE BIOPSY Right 11/16/2018   Procedure: RIGHT BREAST RADIOACTIVE SEED GUIDED PARTIAL MASTECTOMY WITH  SENTINEL  NODE BIOPSY;  Surgeon: Abigail Miyamoto, MD;  Location: [facility] OR;  Service: General;  Laterality: Right;   RIGHT COLECTOMY     RIGHT URETEROSCOPIC STONE EXTRACTION  12-11-2009  DR Jonny Ruiz [facility]   TENDON RELEASE     TONSILLECTOMY     TOTAL HIP ARTHROPLASTY Right 11/26/2016   Procedure: RIGHT TOTAL HIP ARTHROPLASTY ANTERIOR APPROACH;  Surgeon: Kathryne Hitch, MD;  Location: [facility];  Service: Orthopedics;  Laterality: Right;   TOTAL KNEE ARTHROPLASTY Left 12/02/2017   Procedure: LEFT TOTAL KNEE ARTHROPLASTY;  Surgeon: Kathryne Hitch, MD;  Location: [facility];  Service: Orthopedics;  Laterality: Left;   TOTAL KNEE ARTHROPLASTY Right 08/03/2022   Procedure: RIGHT TOTAL KNEE ARTHROPLASTY;  Surgeon: Kathryne Hitch, MD;  Location: [facility] OR;  Service: Orthopedics;  Laterality: Right;   WRIST SURGERY     both   Social History   Occupational History   Occupation: Retired  Tobacco Use   Smoking status: Former    Current packs/day: 0.00    Average packs/day: 1 pack/day for 10.0 years (10.0 ttl pk-yrs)    Types: Cigarettes    Start date: 09/22/1972    Quit date: 09/22/1982    Years since quitting: 40.7   Smokeless tobacco: Never   Tobacco comments:    quit 1983  Vaping Use   Vaping status: Never Used  Substance and Sexual Activity    Alcohol use: No   Drug use: No   Sexual activity: Yes    Partners: Male    Birth control/protection: Post-menopausal

## 2023-06-23 DIAGNOSIS — Z6834 Body mass index (BMI) 34.0-34.9, adult: Secondary | ICD-10-CM | POA: Diagnosis not present

## 2023-06-23 DIAGNOSIS — Z7985 Long-term (current) use of injectable non-insulin antidiabetic drugs: Secondary | ICD-10-CM | POA: Diagnosis not present

## 2023-06-23 DIAGNOSIS — E119 Type 2 diabetes mellitus without complications: Secondary | ICD-10-CM | POA: Diagnosis not present

## 2023-06-27 ENCOUNTER — Ambulatory Visit: Payer: PPO | Admitting: Sports Medicine

## 2023-06-27 ENCOUNTER — Encounter: Payer: Self-pay | Admitting: Sports Medicine

## 2023-06-27 DIAGNOSIS — M7631 Iliotibial band syndrome, right leg: Secondary | ICD-10-CM

## 2023-06-27 DIAGNOSIS — M1612 Unilateral primary osteoarthritis, left hip: Secondary | ICD-10-CM | POA: Diagnosis not present

## 2023-06-27 DIAGNOSIS — M25551 Pain in right hip: Secondary | ICD-10-CM

## 2023-06-27 DIAGNOSIS — M7071 Other bursitis of hip, right hip: Secondary | ICD-10-CM | POA: Diagnosis not present

## 2023-06-27 NOTE — Progress Notes (Signed)
Lauren [name] - 77 y.o. female MRN [id number]  Date of birth: 03-04-1946  Office Visit Note: Visit Date: 06/27/2023 PCP: Daisy Floro, MD Referred by: Daisy Floro, MD  Subjective: No chief complaint on file.  HPI: Lauren [name] is a pleasant 77 y.o. female who presents today for bilateral hip pain.  Right hip -we have performed a number of extracorporeal shockwave treatments for the right iliopsoas/hip flexors as well as her greater trochanteric and gluteal tendons.  She is making good improvements with this.  We did restart her home exercises at the last visit.  Left hip -has known advanced arthritis with previous ultrasound-guided intra-articular hip injection about 1.5 months ago - this is still benefiting her fairly well at this point.  Ultimately she is planning for hip replacement with Dr. Magnus Ivan at the start of the new year.  She continues on her chronic hydrocodone/acetaminophen 10-325 mg 3 times daily.  Pertinent ROS were reviewed with the patient and found to be negative unless otherwise specified above in HPI.   Assessment & Plan: Visit Diagnoses:  1. Greater trochanteric pain syndrome of right lower extremity   2. It band syndrome, right   3. Unilateral primary osteoarthritis, left hip   4. Iliopsoas bursitis of right hip    Plan: [name] has made good progress and improvement with extracorporeal shockwave therapy as well as PT/HEP for her right anterior hip flexors and greater trochanteric pain syndrome.  We did repeat treatment today.  At this point, I would like to take a 1 month holiday and see what sort of cumulative benefit she gets going forward.  She may continue her home exercises in the meantime.  She will notify me after a month how she is doing and we can always consider repeat shockwave if desired.  Additional treatment considerations could be a one-time ultrasound-guided greater trochanteric injection.  In terms of the left hip, she is  still receiving benefit from the intra-articular hip injection.  I would like her to continue her home exercises for this.  She will continue her chronic hydrocodone-acetaminophen 10-325 mg 3 times daily for pain.  She is planning for likely hip replacement in January 2025, could consider a repeat ultrasound-guided intra-articular injection to bridge the gap prior to this but would need to leave at least 6-8 weeks before her planned replacement. She will notify me of progress in 48-month by MyChart or phone.  Follow-up: Return in about 1 month (around 07/28/2023), or if symptoms worsen or fail to improve.   Meds & Orders: No orders of the defined types were placed in this encounter.  No orders of the defined types were placed in this encounter.    Procedures: Procedure: ECSWT Indications:  IT-band syndrome, GT syndrome, and iliopsoas tendinopathy/bursitis   Procedure Details Consent: Risks of procedure as well as the alternatives and risks of each were explained to the patient.  Verbal consent for procedure obtained. Time Out: Verified patient identification, verified procedure, site was marked, verified correct patient position. The area was cleaned with alcohol swab.     The right greater trochanter and IT-band was targeted for Extracorporeal shockwave therapy.    Preset: GT bursitits/IT-band Power Level: 120 mJ Frequency: 12 Hz Impulse/cycles: 2200 Head size: Regular   Patient tolerated procedure well without immediate complications.   The right iliopsoas and anterior hip flexors was targeted for Extracorporeal shockwave therapy.    Preset: Muscle Injury Power Level: 120 mJ Frequency: 12 Hz Impulse/cycles: 2500 Head  size: Regular   Patient tolerated procedure well without immediate complications.       Clinical History: No specialty comments available.  She reports that she quit smoking about 40 years ago. Her smoking use included cigarettes. She started smoking about 50 years  ago. She has a 10 pack-year smoking history. She has never used smokeless tobacco.  Recent Labs    07/23/22 0936 03/23/23 1012 06/08/23 0941  HGBA1C 6.1* 6.5* 6.0*    Objective:   Vital Signs: There were no vitals taken for this visit.  Physical Exam  Gen: Well-appearing, in no acute distress; non-toxic CV:  Well-perfused. Warm.  Resp: Breathing unlabored on room air; no wheezing. Psych: Fluid speech in conversation; appropriate affect; normal thought process Neuro: Sensation intact throughout. No gross coordination deficits.   Ortho Exam - Bilateral hips: No tenderness to palpation over either greater trochanteric today.  Well-healed incision from prior right hip THA.  There is improved hip abduction strength on the right hip, only mild pain with resistance.  Positive Stinchfield on the left.  Imaging: No results found.  Past Medical/Family/Surgical/Social History: Medications & Allergies reviewed per EMR, new medications updated. 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/03/2022   Stress 07/19/2022   Other constipation 07/19/2022   Atypical chest pain 06/25/2021   Class 2 severe obesity with serious comorbidity and body mass index (BMI) of 37.0 to 37.9 in adult (HCC) 06/03/2021   Vitamin D deficiency 06/03/2021   Other fatigue 04/22/2021   SOB (shortness of breath) on exertion 04/22/2021   Lower extremity edema 03/05/2021   Coronary artery calcification 02/20/2020   Pure hypercholesterolemia 02/20/2020   Unilateral primary osteoarthritis, right knee 01/03/2019   Genetic testing 12/07/2018   Family history of  breast cancer    Family history of prostate cancer    Family history of colon cancer    Family history of kidney cancer    Malignant neoplasm of upper-outer quadrant of right breast in female, 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/09/2018   Diabetic neuropathy, type II diabetes mellitus (HCC) 11/09/2018   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/02/2017   Trochanteric bursitis, right hip 05/10/2017   Status post total replacement of right hip 11/26/2016   DDD (degenerative disc disease), 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   Low back pain 04/23/2015   Hiatal hernia 04/13/2013   Diabetes mellitus (HCC) 01/26/2013   Umbilical hernia 07/24/2012   Essential hypertension    Past Medical History:  Diagnosis Date   Anxiety    Arthritis    Back, Hip- right   Atypical chest pain 06/25/2021   Back pain    Cancer (HCC)    right breast   Cataract    Colitis    Coronary artery calcification 02/20/2020   Diabetes mellitus without complication (HCC)    Type II   Edema 03/05/2021   Edema of both lower extremities    Endometriosis    Family history of adverse reaction to anesthesia    Father - N/V - blood pressure; daughter N/V   Family history of breast cancer    Family history of colon cancer    Family history of kidney cancer  Family history of prostate cancer    Fatty liver    GERD (gastroesophageal reflux disease)    History of hiatal hernia    History of kidney stones    History of ulcerative colitis    Hypertension    Insomnia    Joint pain    Kidney problem    Lower extremity edema 03/05/2021   Lumbar disc disease    L4 L5   Neuropathy    Osteoarthritis    Other hyperlipidemia    Palpitations    Personal history of radiation therapy    Pneumonia    1983, 2003   Pure hypercholesterolemia 02/20/2020   PVCs (premature ventricular contractions)    benign   Umbilical hernia    Vitamin D deficiency    Wears glasses    Family History  Problem Relation Age of Onset    Hyperlipidemia Mother    Diabetes Mother    Hypertension Mother    Parkinson's disease Mother    Kidney disease Mother    Kidney disease Father    Stroke Father    Heart failure Father    Hypertension Father    Heart disease Father    Diabetes Father    Cancer Father        colon, prostate, and kidney   Heart disease Maternal Grandmother    Diabetes Maternal Grandmother    Heart disease Maternal Grandfather    Diabetes Maternal Grandfather    Cancer Maternal Grandfather        pt unaware of what kind   Stroke Paternal Grandmother    Stroke Paternal Grandfather    Cancer Maternal Aunt        cervical vs ovarian   Colon cancer Paternal Aunt        dx less than 50   Breast cancer Paternal Aunt        dx in her 80s   Prostate cancer Paternal Uncle    Breast cancer Cousin        pat first cousin, dx over 45   Prostate cancer Other        PGF's father   Past Surgical History:  Procedure Laterality Date   APPENDECTOMY     BREAST BIOPSY  08-13-2009  DR TSUEI   EXCISION LEFT NIPPLE DUCT   BREAST EXCISIONAL BIOPSY Left    x2   BREAST EXCISIONAL BIOPSY Right    BREAST LUMPECTOMY Right 11/16/2018   invasive ductal   CHOLECYSTECTOMY  1992   COLON RESECTION  1986   ULCERATIVE COLITIS   COLON SURGERY     ESOPHAGEAL MANOMETRY N/A 06/11/2013   Procedure: ESOPHAGEAL MANOMETRY (EM);  Surgeon: Petra Kuba, MD;  Location: [facility];  Service: Endoscopy;  Laterality: N/A;   ESOPHAGOGASTRODUODENOSCOPY (EGD) WITH PROPOFOL N/A 05/31/2016   Procedure: ESOPHAGOGASTRODUODENOSCOPY (EGD) WITH PROPOFOL;  Surgeon: Vida Rigger, MD;  Location: [facility] ENDOSCOPY;  Service: Endoscopy;  Laterality: N/A;   EXCISION RIGHT BREAST MASS   10-20-2005  DR Theron Arista YOUNG   EYE SURGERY Bilateral 2023   cataract   FRACTURE SURGERY     left arm   kidney stone removed  12/2009   KNEE ARTHROSCOPY Left    MCL   LEFT THUMB CARPOMETACARPAL JOINT SUSPENSIONPLASTY  04-06-2006  DR Mina Marble   LEFT WRIST ARTHROSCOPY W/  DEBRIDEMENT AND REMOVAL CYST  03-26-2004  DR Mina Marble   MYOMECTOMY     RADIOACTIVE SEED GUIDED PARTIAL MASTECTOMY WITH AXILLARY SENTINEL LYMPH NODE BIOPSY Right 11/16/2018   Procedure:  RIGHT BREAST RADIOACTIVE SEED GUIDED PARTIAL MASTECTOMY WITH  SENTINEL  NODE BIOPSY;  Surgeon: Abigail Miyamoto, MD;  Location: [facility] OR;  Service: General;  Laterality: Right;   RIGHT COLECTOMY     RIGHT URETEROSCOPIC STONE EXTRACTION  12-11-2009  DR Jonny Ruiz [facility]   TENDON RELEASE     TONSILLECTOMY     TOTAL HIP ARTHROPLASTY Right 11/26/2016   Procedure: RIGHT TOTAL HIP ARTHROPLASTY ANTERIOR APPROACH;  Surgeon: Kathryne Hitch, MD;  Location: [facility] OR[facility];  Service: Orthopedics;  Laterality: Right;   TOTAL KNEE ARTHROPLASTY Left 12/02/2017   Procedure: LEFT TOTAL KNEE ARTHROPLASTY;  Surgeon: Kathryne Hitch, MD;  Location: [facility] OR[facility];  Service: Orthopedics;  Laterality: Left;   TOTAL KNEE ARTHROPLASTY Right 08/03/2022   Procedure: RIGHT TOTAL KNEE ARTHROPLASTY;  Surgeon: Kathryne Hitch, MD;  Location: [facility] OR;  Service: Orthopedics;  Laterality: Right;   WRIST SURGERY     both   Social History   Occupational History   Occupation: Retired  Tobacco Use   Smoking status: Former    Current packs/day: 0.00    Average packs/day: 1 pack/day for 10.0 years (10.0 ttl pk-yrs)    Types: Cigarettes    Start date: 09/22/1972    Quit date: 09/22/1982    Years since quitting: 40.7   Smokeless tobacco: Never   Tobacco comments:    quit 1983  Vaping Use   Vaping status: Never Used  Substance and Sexual Activity   Alcohol use: No   Drug use: No   Sexual activity: Yes    Partners: Male    Birth control/protection: Post-menopausal

## 2023-06-29 DIAGNOSIS — M150 Primary generalized (osteo)arthritis: Secondary | ICD-10-CM | POA: Diagnosis not present

## 2023-06-29 DIAGNOSIS — M25551 Pain in right hip: Secondary | ICD-10-CM | POA: Diagnosis not present

## 2023-06-29 DIAGNOSIS — M47817 Spondylosis without myelopathy or radiculopathy, lumbosacral region: Secondary | ICD-10-CM | POA: Diagnosis not present

## 2023-06-29 DIAGNOSIS — M25552 Pain in left hip: Secondary | ICD-10-CM | POA: Diagnosis not present

## 2023-07-03 ENCOUNTER — Other Ambulatory Visit (INDEPENDENT_AMBULATORY_CARE_PROVIDER_SITE_OTHER): Payer: Self-pay | Admitting: Family Medicine

## 2023-07-03 DIAGNOSIS — E1169 Type 2 diabetes mellitus with other specified complication: Secondary | ICD-10-CM

## 2023-07-05 ENCOUNTER — Inpatient Hospital Stay: Payer: PPO | Attending: Hematology and Oncology | Admitting: Hematology and Oncology

## 2023-07-05 VITALS — BP 124/57 | HR 68 | Temp 97.3°F | Resp 18 | Ht 64.0 in | Wt 203.6 lb

## 2023-07-05 DIAGNOSIS — C50411 Malignant neoplasm of upper-outer quadrant of right female breast: Secondary | ICD-10-CM

## 2023-07-05 DIAGNOSIS — Z800 Family history of malignant neoplasm of digestive organs: Secondary | ICD-10-CM | POA: Insufficient documentation

## 2023-07-05 DIAGNOSIS — Z8042 Family history of malignant neoplasm of prostate: Secondary | ICD-10-CM | POA: Diagnosis not present

## 2023-07-05 DIAGNOSIS — Z87891 Personal history of nicotine dependence: Secondary | ICD-10-CM | POA: Diagnosis not present

## 2023-07-05 DIAGNOSIS — Z853 Personal history of malignant neoplasm of breast: Secondary | ICD-10-CM | POA: Insufficient documentation

## 2023-07-05 DIAGNOSIS — Z8051 Family history of malignant neoplasm of kidney: Secondary | ICD-10-CM | POA: Insufficient documentation

## 2023-07-05 DIAGNOSIS — Z923 Personal history of irradiation: Secondary | ICD-10-CM | POA: Insufficient documentation

## 2023-07-05 DIAGNOSIS — Z803 Family history of malignant neoplasm of breast: Secondary | ICD-10-CM | POA: Insufficient documentation

## 2023-07-05 DIAGNOSIS — Z170 Estrogen receptor positive status [ER+]: Secondary | ICD-10-CM | POA: Diagnosis not present

## 2023-07-05 NOTE — Progress Notes (Signed)
TeleHealth Visit:  This visit was completed with telemedicine (audio/video) technology. Lauren [name] has verbally consented to this TeleHealth visit. The patient is located at home, the provider is located at home. The participants in this visit include the listed provider and patient. The visit was conducted today via MyChart video.  OBESITY Lauren [name] is here to discuss her progress with her obesity treatment plan along with follow-up of her obesity related diagnoses.   Today's visit was # 28 Starting weight: 219 lbs Starting date: 04/22/21 Weight at last in office visit: 202 lbs on 06/08/23 Total weight loss: 17 lbs at last in office visit on 06/08/23. Weight at home: 199.7 lbs.  Nutrition Plan: the Category 2 plan   Current exercise:  Exercises for IT band twice weekly 3 x per day.  Interim History:  Planning THR for January 2025. She has been on and off the meal plan. However she has lost weight.  She is happy to finally be under 200 pounds. She has noticed increased hunger with lower dose of Mounjaro (decreased to 2.5 mg last OV). She is working on getting in enough protein. She has felt a bit down due to grief - husband passed away almost 2 years ago.  Assessment/Plan:  We discussed recent lab results in depth.  1. Type 2 Diabetes Mellitus with diabetic neuropathy, without long-term current use of insulin HgbA1c is not at goal. Last A1c was 6.0. Medication(s): Mounjaro 2.5 mg SQ weekly.  (decreased to 2.5 mg last OV). Tolerating fairly well.  Still has GI upset when she has [place] or [place] food.  Lab Results  Component Value Date   HGBA1C 6.0 (H) 06/08/2023   HGBA1C 6.5 (H) 03/23/2023   HGBA1C 6.1 (H) 07/23/2022   Lab Results  Component Value Date   LDLCALC 41 06/08/2023   CREATININE 0.91 06/08/2023   No results found for: "GFR"  Plan: Continue and refill Mounjaro 2.5 mg SQ weekly Avoid spicy foods.  2. Vitamin D Deficiency Vitamin D is at goal of 50.  Most  recent vitamin D level was 100. She is on  prescription ergocalciferol 50,000 IU weekly and daily 5000 OTC . Lab Results  Component Value Date   VD25OH 100.0 06/08/2023   VD25OH 55.8 12/01/2022   VD25OH 69.3 05/11/2022    Plan: Continue and decrease dose  prescription ergocalciferol 50,000 IU every 14 days Continue [name] 5000  daily.  3. Generalized Obesity: Current BMI 37.9  [name] is currently in the action stage of change. As such, her goal is to continue with weight loss efforts.  She has agreed to the Category 2 plan.  Exercise goals:  as is  Behavioral modification strategies: increasing lean protein intake, decreasing simple carbohydrates , and planning for success.  [name] has agreed to follow-up with our clinic in 4 weeks.  No orders of the defined types were placed in this encounter.   Medications Discontinued During This Encounter  Medication Reason   tirzepatide [facility]) 2.5 MG/0.5ML Pen Reorder   Vitamin D, Ergocalciferol, (DRISDOL) 1.25 MG (50000 UNIT) CAPS capsule Reorder     Meds ordered this encounter  Medications   tirzepatide (MOUNJARO) 2.5 MG/0.5ML Pen    Sig: Inject 2.5 mg into the skin once a week.    Dispense:  2 mL    Refill:  0    Order Specific Question:   Supervising Provider    Answer:   Seymour Bars E [[id number]]   Vitamin D, Ergocalciferol, (DRISDOL) 1.25 MG (50000 UNIT)  CAPS capsule    Sig: Take 1 capsule (50,000 Units total) by mouth every 14 (fourteen) days.    Order Specific Question:   Supervising Provider    Answer:   Glennis Brink [[id number]]      Objective:   VITALS: Per patient if applicable, see vitals. GENERAL: Alert and in no acute distress. CARDIOPULMONARY: No increased WOB. Speaking in clear sentences.  PSYCH: Pleasant and cooperative. Speech normal rate and rhythm. Affect is appropriate. Insight and judgement are appropriate. Attention is focused, linear, and appropriate.  NEURO: Oriented as arrived to appointment on  time with no prompting.   Attestation Statements:   Reviewed by clinician on day of visit: allergies, medications, problem list, medical history, surgical history, family history, social history, and previous encounter notes.   This was prepared with the assistance of Engineer, civil (consulting).  Occasional wrong-word or sound-a-like substitutions may have occurred due to the inherent limitations of voice recognition software.

## 2023-07-05 NOTE — Progress Notes (Signed)
Lauren [name]  [phone] [phone]) [phone]     ID: Lauren [name] DOB: Mar 02, 1946  MR#: [id number]  VHQ#:[id number]  Patient Care Team: Daisy Floro, MD as PCP - General (Family Medicine) Chilton Si, MD as PCP - Cardiology (Cardiology) Kathryne Hitch, MD as Consulting Physician (Orthopedic Surgery) Magrinat, Valentino Hue, MD (Inactive) as Consulting Physician (Oncology) Dorothy Puffer, MD as Consulting Physician (Radiation Oncology) Harrington Challenger, NP (Inactive) as Nurse Practitioner (Obstetrics and Gynecology) Vida Rigger, MD as Consulting Physician (Gastroenterology) Abigail Miyamoto, MD as Consulting Physician (General Surgery) Wilder Glade, MD as Consulting Physician (Family Medicine) OTHER MD:  CHIEF COMPLAINT: Estrogen receptor positive breast cancer  CURRENT TREATMENT: Observation  INTERVAL HISTORY:  Lauren [name] returns today for follow-up of her estrogen receptor positive breast cancer.  She is now under observation alone. Since last visit she had a mammogram April 2024 which is neg for malignancy. Rest of the pertinent 10 point ROS reviewed and negative  COVID 19 VACCINATION STATUS: Pfizer x2   HISTORY OF CURRENT ILLNESS: From the original intake note:  "Lauren [name]" had routine screening mammography on 10/04/2018 showing a possible abnormality in the right breast. She underwent unilateral right diagnostic mammography with tomography and right breast ultrasonography at [name] on 10/12/2018 showing: Breast Density Category C. Spot compression tomosynthesis images through the upper outer quadrant of the right breast demonstrates a persistent irregular mass with indistinct margins measuring approximately 1 cm. On physical exam, no definite palpable mass is identified in the upper-outer right breast. Targeted ultrasound is performed, showing an irregular hypoechoic shadowing mass at 10:30 oclock, 6 cm from the nipple  measuring 9 x 8 x 9 mm. Ultrasound of the right axilla demonstrates multiple normal-appearing lymph nodes.  Accordingly on 10/18/2018 she proceeded to biopsy of the right breast area in question. The pathology from this procedure showed ([id number]): invasive ductal carcinoma, grade II. Prognostic indicators significant for: estrogen receptor, 100% positive and progesterone receptor, 20% positive, both with strong staining intensity. Proliferation marker Ki67 at 5%. HER2 equivocal (2+) by immunohistochemisty but negative by fluorescent in situ hybridization with a signals ratio 1.26 and number per cell 2.40.   The patient's subsequent history is as detailed below.   PAST MEDICAL HISTORY: Past Medical History:  Diagnosis Date   Anxiety    Arthritis    Back, Hip- right   Atypical chest pain 06/25/2021   Back pain    Cancer (HCC)    right breast   Cataract    Colitis    Coronary artery calcification 02/20/2020   Diabetes mellitus without complication (HCC)    Type II   Edema 03/05/2021   Edema of both lower extremities    Endometriosis    Family history of adverse reaction to anesthesia    Father - N/V - blood pressure; daughter N/V   Family history of breast cancer    Family history of colon cancer    Family history of kidney cancer    Family history of prostate cancer    Fatty liver    GERD (gastroesophageal reflux disease)    History of hiatal hernia    History of kidney stones    History of ulcerative colitis    Hypertension    Insomnia    Joint pain    Kidney problem    Lower extremity edema 03/05/2021   Lumbar disc disease    L4 L5   Neuropathy    Osteoarthritis    Other hyperlipidemia  Palpitations    Personal history of radiation therapy    Pneumonia    1983, 2003   Pure hypercholesterolemia 02/20/2020   PVCs (premature ventricular contractions)    benign   Umbilical hernia    Vitamin D deficiency    Wears glasses     PAST SURGICAL HISTORY: Past  Surgical History:  Procedure Laterality Date   APPENDECTOMY     BREAST BIOPSY  08-13-2009  DR TSUEI   EXCISION LEFT NIPPLE DUCT   BREAST EXCISIONAL BIOPSY Left    x2   BREAST EXCISIONAL BIOPSY Right    BREAST LUMPECTOMY Right 11/16/2018   invasive ductal   CHOLECYSTECTOMY  1992   COLON RESECTION  1986   ULCERATIVE COLITIS   COLON SURGERY     ESOPHAGEAL MANOMETRY N/A 06/11/2013   Procedure: ESOPHAGEAL MANOMETRY (EM);  Surgeon: Petra Kuba, MD;  Location: [facility];  Service: Endoscopy;  Laterality: N/A;   ESOPHAGOGASTRODUODENOSCOPY (EGD) WITH PROPOFOL N/A 05/31/2016   Procedure: ESOPHAGOGASTRODUODENOSCOPY (EGD) WITH PROPOFOL;  Surgeon: Vida Rigger, MD;  Location: [name];  Service: Endoscopy;  Laterality: N/A;   EXCISION RIGHT BREAST MASS   10-20-2005  DR Theron Arista YOUNG   EYE SURGERY Bilateral 2023   cataract   FRACTURE SURGERY     left arm   kidney stone removed  12/2009   KNEE ARTHROSCOPY Left    MCL   LEFT THUMB CARPOMETACARPAL JOINT SUSPENSIONPLASTY  04-06-2006  DR Mina Marble   LEFT WRIST ARTHROSCOPY W/ DEBRIDEMENT AND REMOVAL CYST  03-26-2004  DR Mina Marble   MYOMECTOMY     RADIOACTIVE SEED GUIDED PARTIAL MASTECTOMY WITH AXILLARY SENTINEL LYMPH NODE BIOPSY Right 11/16/2018   Procedure: RIGHT BREAST RADIOACTIVE SEED GUIDED PARTIAL MASTECTOMY WITH  SENTINEL  NODE BIOPSY;  Surgeon: Abigail Miyamoto, MD;  Location: [facility] OR;  Service: General;  Laterality: Right;   RIGHT COLECTOMY     RIGHT URETEROSCOPIC STONE EXTRACTION  12-11-2009  DR Jonny Ruiz [name]   TENDON RELEASE     TONSILLECTOMY     TOTAL HIP ARTHROPLASTY Right 11/26/2016   Procedure: RIGHT TOTAL HIP ARTHROPLASTY ANTERIOR APPROACH;  Surgeon: Kathryne Hitch, MD;  Location: [facility];  Service: Orthopedics;  Laterality: Right;   TOTAL KNEE ARTHROPLASTY Left 12/02/2017   Procedure: LEFT TOTAL KNEE ARTHROPLASTY;  Surgeon: Kathryne Hitch, MD;  Location: [facility];  Service: Orthopedics;  Laterality: Left;   TOTAL KNEE  ARTHROPLASTY Right 08/03/2022   Procedure: RIGHT TOTAL KNEE ARTHROPLASTY;  Surgeon: Kathryne Hitch, MD;  Location: [facility] OR;  Service: Orthopedics;  Laterality: Right;   WRIST SURGERY     both    FAMILY HISTORY: Family History  Problem Relation Age of Onset   Hyperlipidemia Mother    Diabetes Mother    Hypertension Mother    Parkinson's disease Mother    Kidney disease Mother    Kidney disease Father    Stroke Father    Heart failure Father    Hypertension Father    Heart disease Father    Diabetes Father    Cancer Father        colon, prostate, and kidney   Heart disease Maternal Grandmother    Diabetes Maternal Grandmother    Heart disease Maternal Grandfather    Diabetes Maternal Grandfather    Cancer Maternal Grandfather        pt unaware of what kind   Stroke Paternal Grandmother    Stroke Paternal Grandfather    Cancer Maternal Aunt  cervical vs ovarian   Colon cancer Paternal Aunt        dx less than 50   Breast cancer Paternal Aunt        dx in her 73s   Prostate cancer Paternal Uncle    Breast cancer Cousin        pat first cousin, dx over 24   Prostate cancer Other        PGF's father  Cindy's father died from colon cancer at age 44; he was diagnosed with prostate cancer at 83, and he also had kidney cancer. Patients' mother died from failure to thrive at age 29. The patient has no siblings. Lauren [name] had a paternal aunt that was diagnosed with breast cancer in her mid 44's. Lauren [name] also has a paternal great grandfather that was diagnosed with prostate cancer. Patient denies anyone in her family having ovarian cancer.    GYNECOLOGIC HISTORY:  No LMP recorded. Patient is postmenopausal. 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 Her husband,  Gabriel Rung, ran a Herbalist. Together, they have two children, Richard and [employer]. Their son, Gerlene Burdock, lives in [place] and is disabled (MS). Their daughter, Baird Lyons, is a Scientist, forensic (and also has MS). Lauren [name] has 5 grandchildren, no great grandchildren. She attends a [employer].    ADVANCED DIRECTIVES: In the absence of any documentation to the contrary, the patient's spouse is their HCPOA.    HEALTH MAINTENANCE: Social History   Tobacco Use   Smoking status: Former    Current packs/day: 0.00    Average packs/day: 1 pack/day for 10.0 years (10.0 ttl pk-yrs)    Types: Cigarettes    Start date: 09/22/1972    Quit date: 09/22/1982    Years since quitting: 40.8   Smokeless tobacco: Never   Tobacco comments:    quit 1983  Vaping Use   Vaping status: Never Used  Substance Use Topics   Alcohol use: No   Drug use: No    Colonoscopy: yes, 3 or 4 years ago  PAP: Stopped at 74; Maryelizabeth Rowan, NP - [name]  Bone density: 12/2019, -1.7  Allergies  Allergen Reactions   Ciprofloxacin Hcl Hives   Percocet [Oxycodone-Acetaminophen] Other (See Comments)    RESPIRATORY DISTRESS   Penicillins Hives and Other (See Comments)    Has patient had a PCN reaction causing immediate rash, facial/tongue/throat swelling, SOB or lightheadedness with hypotension: no Has patient had a PCN reaction causing severe rash involving mucus membranes or skin necrosis: no Has patient had a PCN reaction that required hospitalization no Has patient had a PCN reaction occurring within the last 10 years: no If all of the above answers are "NO", then may proceed with Cephalosporin use.   Sulfa Antibiotics Hives   Tape Other (See Comments)    Adhesives - Causes Blisters. No band aids, and plastic tape    Tetanus Toxoids Hives and Rash   Jardiance [Empagliflozin]     Severe UTI    Current Outpatient Medications  Medication Sig Dispense Refill   aspirin EC 81 MG  tablet Take 81 mg by mouth daily. Swallow whole.     B Complex-C (B-COMPLEX WITH VITAMIN C) tablet Take 1 tablet by mouth daily.     chlorthalidone (HYGROTON)  25 MG tablet TAKE ONE TABLET BY MOUTH ONCE DAILY 90 tablet 0   escitalopram (LEXAPRO) 20 MG tablet Take 1 tablet (20 mg total) by mouth daily. 30 tablet 0   fluticasone (FLONASE) 50 MCG/ACT nasal spray Place 2 sprays into both nostrils daily as needed for allergies or rhinitis. 9.9 mL 0   gabapentin (NEURONTIN) 300 MG capsule Take 600 mg by mouth at bedtime.   1   HYDROcodone-acetaminophen (NORCO) 10-325 MG tablet Take 1 tablet by mouth in the morning, at noon, and at bedtime.     Lancets (ONETOUCH DELICA PLUS LANCET33G) MISC      lidocaine (LMX) 4 % cream Apply 1 application topically daily as needed (for joint pain).      methocarbamol (ROBAXIN) 500 MG tablet Take 1 tablet (500 mg total) by mouth every 6 (six) hours as needed for muscle spasms. (Patient taking differently: Take 500 mg by mouth at bedtime.) 60 tablet 0   ONETOUCH VERIO test strip      pantoprazole (PROTONIX) 40 MG tablet Take 40 mg by mouth 2 (two) times daily.     phenylephrine (NEO-SYNEPHRINE) 0.25 % nasal spray Place 1 spray into both nostrils at bedtime as needed for congestion.     rosuvastatin (CRESTOR) 20 MG tablet Take 1 tablet (20 mg total) by mouth daily. 90 tablet 0   tirzepatide (MOUNJARO) 2.5 MG/0.5ML Pen Inject 2.5 mg into the skin once a week. 2 mL 0   Vitamin D, Ergocalciferol, (DRISDOL) 1.25 MG (50000 UNIT) CAPS capsule Take 1 capsule (50,000 Units total) by mouth every 7 (seven) days. 4 capsule 0   No current facility-administered medications for this visit.    OBJECTIVE:  white woman who appears stated age  There were no vitals filed for this visit.     There is no height or weight on file to calculate BMI.   Wt Readings from Last 3 Encounters:  06/08/23 202 lb (91.6 kg)  06/06/23 206 lb (93.4 kg)  04/13/23 210 lb (95.3 kg)      ECOG FS:2 -  Symptomatic, <50% confined to bed  Physical Exam Constitutional:      Appearance: Normal appearance.  Chest:     Comments: Bilateral breasts inspected and palpated.  No masses or regional adenopathy. Musculoskeletal:     Cervical back: Normal range of motion. No rigidity.  Lymphadenopathy:     Cervical: No cervical adenopathy.  Neurological:     Mental Status: She is alert.      LAB RESULTS:  CMP     Component Value Date/Time   NA 132 (L) 06/08/2023 0941   K 3.7 06/08/2023 0941   CL 91 (L) 06/08/2023 0941   CO2 24 06/08/2023 0941   GLUCOSE 89 06/08/2023 0941   GLUCOSE 127 (H) 08/04/2022 0331   BUN 14 06/08/2023 0941   CREATININE 0.91 06/08/2023 0941   CREATININE 0.87 06/08/2022 1439   CALCIUM 9.5 06/08/2023 0941   PROT 6.7 06/08/2023 0941   ALBUMIN 4.3 06/08/2023 0941   AST 19 06/08/2023 0941   AST 16 06/08/2022 1439   ALT 16 06/08/2023 0941   ALT 12 06/08/2022 1439   ALKPHOS 58 06/08/2023 0941   BILITOT 0.4 06/08/2023 0941   BILITOT 0.4 06/08/2022 1439   GFRNONAA 52 (L) 08/04/2022 0331   GFRNONAA >60 06/08/2022 1439   GFRAA >60 02/04/2020 1212   GFRAA >60 11/09/2018 1405    No results found for: "TOTALPROTELP", "ALBUMINELP", "A1GS", "A2GS", "BETS", "BETA2SER", "GAMS", "MSPIKE", "SPEI"  No results found for: "KPAFRELGTCHN", "LAMBDASER", "KAPLAMBRATIO"  Lab Results  Component Value Date   WBC 7.8 08/04/2022   NEUTROABS 4.0 06/08/2022   HGB 9.5 (L) 08/04/2022   HCT 27.9 (L) 08/04/2022   MCV 88.9 08/04/2022   PLT 205 08/04/2022   No results found for: "LABCA2"  No components found for: "ZOXWRU045"  No results for input(s): "INR" in the last 168 hours.  No results found for: "LABCA2"  No results found for: "WUJ811"  No results found for: "CAN125"  No results found for: "CAN153"  No results found for: "CA2729"  No components found for: "HGQUANT"  No results found for: "CEA1", "CEA" / No results found for: "CEA1", "CEA"   No results found for:  "AFPTUMOR"  No results found for: "CHROMOGRNA"  No results found for: "HGBA", "HGBA2QUANT", "HGBFQUANT", "HGBSQUAN" (Hemoglobinopathy evaluation)   No results found for: "LDH"  No results found for: "IRON", "TIBC", "IRONPCTSAT" (Iron and TIBC)  No results found for: "FERRITIN"  Urinalysis    Component Value Date/Time   COLORURINE YELLOW 09/19/2018 1143   APPEARANCEUR CLEAR 09/19/2018 1143   LABSPEC 1.010 09/19/2018 1143   PHURINE 6.0 09/19/2018 1143   GLUCOSEU 3+ (A) 09/19/2018 1143   HGBUR TRACE (A) 09/19/2018 1143   BILIRUBINUR NEGATIVE 11/05/2015 1512   KETONESUR NEGATIVE 09/19/2018 1143   PROTEINUR NEGATIVE 09/19/2018 1143   UROBILINOGEN 0.2 11/28/2013 1456   NITRITE NEGATIVE 11/05/2015 1512   LEUKOCYTESUR 1+ (A) 11/05/2015 1512    STUDIES:  No results found.   ELIGIBLE FOR AVAILABLE RESEARCH PROTOCOL: no   ASSESSMENT: 77 y.o. [place], [place] woman status post right breast upper outer quadrant biopsy 10/18/2018 for a clinical T1b N0, stage IA invasive ductal carcinoma, grade 2, estrogen and progesterone receptor positive, MIB-1 5%, with no HER-2 amplification by FISH  (1) right lumpectomy and sentinel lymph node sampling 11/16/2018 showed a pT1c pN0, stage IA invasive ductal carcinoma, grade 2, with negative margins  (a) a single sentinel lymph node was removed  (2) Oncotype score of 12 predicted a risk of recurrence outside the breast over the next 9 years of only 3% if the patient's only systemic treatment was antiestrogens for 5 years.  No significant benefit was anticipated from chemotherapy.  (a) Oncotype also read tumor as HER-2 negative  (3) adjuvant radiation 01/02/2019 to 01/29/2019. Site/dose:   The patient initially received a dose of 42.56 Gy in 16 fractions to the right breast using whole-breast tangent fields. This was delivered using a 3-D conformal technique. The patient then received a boost. This delivered an additional 8 Gy in 4 fractions using a 3  field photon technique. The total dose was 50.56 Gy.  (4) anastrozole started 02/13/2019  (a) bone density 01/06/2012 was normal  (b) anastrozole discontinued April 2021, with arthralgias/myalgias  (5) genetics testing 11/27/2018 through the Multi-Gene Panel offered by [facility] found no deleterious mutations in AIP, ALK, APC, ATM, AXIN2,BAP1,  BARD1, BLM, BMPR1A, BRCA1, BRCA2, BRIP1, CASR, CDC73, CDH1, CDK4, CDKN1B, CDKN1C, CDKN2A (p14ARF), CDKN2A (p16INK4a), CEBPA, CHEK2, CTNNA1, DICER1, DIS3L2, EGFR (c.2369C>T, p.Thr790Met variant only), EPCAM (Deletion/duplication testing only), FH, FLCN, GATA2, GPC3, GREM1 (Promoter region deletion/duplication testing only), HOXB13 (c.251G>A, p.Gly84Glu), HRAS, KIT, MAX, MEN1, MET, MITF (c.952G>A, p.Glu318Lys variant only), MLH1, MSH2, MSH3, MSH6, MUTYH, NBN, NF1, NF2, NTHL1, PALB2, PDGFRA, PHOX2B, PMS2, POLD1, POLE, POT1, PRKAR1A, PTCH1, PTEN, RAD50, RAD51C, RAD51D, RB1, RECQL4, RET, RUNX1, SDHAF2, SDHA (sequence changes only), SDHB, SDHC, SDHD, SMAD4, SMARCA4, SMARCB1, SMARCE1, STK11, SUFU, TERC, TERT, TMEM127, TP53, TSC1,  TSC2, VHL, WRN and WT1.    (a) a variant of uncertain significant was found in DICER1, c.2118C>T (silent)  (6) exemestane prescribed 04/03/2020--could not obtain secondary to cost  (a) started letrozole Jun 2021, discontinued because of intolerable side effects  (b) opted against tamoxifen (see discussion 08/05/2020 visit)   PLAN:  Lauren [name] is currently on observation alone.  She could not tolerate aromatase inhibitors. No concerns for breast cancer recurrence.  She will continue with annual screening mammogram and return to clinic once a year.   *Total Encounter Time as defined by the Centers for Medicare and Medicaid Services includes, in addition to the face-to-face time of a patient visit (documented in the note above) non-face-to-face time: obtaining and reviewing outside history, ordering and reviewing medications, tests or  procedures, care coordination (communications with other health care professionals or caregivers) and documentation in the medical record.

## 2023-07-06 ENCOUNTER — Telehealth (INDEPENDENT_AMBULATORY_CARE_PROVIDER_SITE_OTHER): Payer: PPO | Admitting: Family Medicine

## 2023-07-06 ENCOUNTER — Encounter (INDEPENDENT_AMBULATORY_CARE_PROVIDER_SITE_OTHER): Payer: Self-pay | Admitting: Family Medicine

## 2023-07-06 DIAGNOSIS — Z7985 Long-term (current) use of injectable non-insulin antidiabetic drugs: Secondary | ICD-10-CM | POA: Diagnosis not present

## 2023-07-06 DIAGNOSIS — Z6834 Body mass index (BMI) 34.0-34.9, adult: Secondary | ICD-10-CM

## 2023-07-06 DIAGNOSIS — E1140 Type 2 diabetes mellitus with diabetic neuropathy, unspecified: Secondary | ICD-10-CM

## 2023-07-06 DIAGNOSIS — E559 Vitamin D deficiency, unspecified: Secondary | ICD-10-CM | POA: Diagnosis not present

## 2023-07-06 DIAGNOSIS — Z6837 Body mass index (BMI) 37.0-37.9, adult: Secondary | ICD-10-CM | POA: Diagnosis not present

## 2023-07-06 DIAGNOSIS — E669 Obesity, unspecified: Secondary | ICD-10-CM | POA: Diagnosis not present

## 2023-07-06 MED ORDER — VITAMIN D (ERGOCALCIFEROL) 1.25 MG (50000 UNIT) PO CAPS: 50000.0000 [IU] | ORAL_CAPSULE | ORAL | Status: DC

## 2023-07-06 MED ORDER — TIRZEPATIDE 2.5 MG/0.5ML ~~LOC~~ SOAJ
2.5000 mg | SUBCUTANEOUS | 0 refills | Status: DC
Start: 2023-07-06 — End: 2023-08-03

## 2023-07-07 ENCOUNTER — Encounter (INDEPENDENT_AMBULATORY_CARE_PROVIDER_SITE_OTHER): Payer: Self-pay | Admitting: Family Medicine

## 2023-07-19 ENCOUNTER — Encounter: Payer: Self-pay | Admitting: Sports Medicine

## 2023-08-01 ENCOUNTER — Encounter: Payer: Self-pay | Admitting: Sports Medicine

## 2023-08-01 ENCOUNTER — Ambulatory Visit: Payer: PPO | Admitting: Sports Medicine

## 2023-08-01 DIAGNOSIS — M7541 Impingement syndrome of right shoulder: Secondary | ICD-10-CM

## 2023-08-01 DIAGNOSIS — M1612 Unilateral primary osteoarthritis, left hip: Secondary | ICD-10-CM

## 2023-08-01 DIAGNOSIS — M25551 Pain in right hip: Secondary | ICD-10-CM

## 2023-08-01 MED ORDER — BUPIVACAINE HCL 0.25 % IJ SOLN
2.0000 mL | INTRAMUSCULAR | Status: AC | PRN
Start: 2023-08-01 — End: 2023-08-01
  Administered 2023-08-01: 2 mL via INTRA_ARTICULAR

## 2023-08-01 MED ORDER — LIDOCAINE HCL 1 % IJ SOLN
2.0000 mL | INTRAMUSCULAR | Status: AC | PRN
Start: 2023-08-01 — End: 2023-08-01
  Administered 2023-08-01: 2 mL

## 2023-08-01 MED ORDER — METHYLPREDNISOLONE ACETATE 40 MG/ML IJ SUSP
40.0000 mg | INTRAMUSCULAR | Status: AC | PRN
Start: 2023-08-01 — End: 2023-08-01
  Administered 2023-08-01: 40 mg via INTRA_ARTICULAR

## 2023-08-01 NOTE — Progress Notes (Signed)
Patient says that her left hip is giving her constant pain that is making it difficult to sleep and do her daily activities. She says that she takes pain medication that helps but only gives some relief. She has been doing her HEP as she can twice per week as instructed.   Patient was instructed in 10 minutes of therapeutic exercises for right shoulder to improve strength, ROM and function according to my instructions and plan of care by a Certified Athletic Trainer during the office visit. A customized handout was provided and demonstration of proper technique shown and discussed. Patient did perform exercises and demonstrate understanding through teachback.  All questions discussed and answered.

## 2023-08-01 NOTE — Progress Notes (Signed)
Lauren [name] - 77 y.o. female MRN [id number]  Date of birth: 1946-04-24  Office Visit Note: Visit Date: 08/01/2023 PCP: Lauren Floro, MD Referred by: Lauren Floro, MD  Subjective: Chief Complaint  Patient presents with   Left Hip - Pain, Follow-up   HPI: Lauren [name] is a pleasant 77 y.o. female who presents today for follow-up of left hip pain with known osteoarthritis.  Had an US-guided left hip IA injection on 06/02/23.  The injection went certainly helped her hip, but is starting to wear off.  She is going to work with our surgical scheduler on possibly planning her hip replacement, looking for sometime in December. She continues on her chronic hydrocodone/acetaminophen 10-325 mg 3 times daily.   Right shoulder pain -has had issues with this before, was diagnosed with impingement in the past.  Here recently she is having pain with laying on that shoulder and this has made sleep difficult.  Medication as above.  Pertinent ROS were reviewed with the patient and found to be negative unless otherwise specified above in HPI.   Assessment & Plan: Visit Diagnoses:  1. Unilateral primary osteoarthritis, left hip   2. Greater trochanteric pain syndrome of right lower extremity   3. Impingement syndrome of right shoulder    Plan: Discussed with Lauren [name] the nature of her chronic left hip pain which is advanced osteoarthritis.  She is considering planning a left hip replacement.  She will work with her surgical scheduler, Lauren [name] in looking possibly sometime in December about getting this scheduled with Dr. Magnus [name].  Discussed possible intra-articular injection for the hip but would need to wait at least 6-8 weeks from her planned surgery, will see when she would like to move forward with this.  She may continue her home exercises for both hips as long as this is not causing her pain with activity.  Discussed we could consider additional shockwave for the greater trochanter  and her hip flexor once her left hip pain was replaced or improved.  Her right shoulder is indicative of recurrent impingement syndrome, through shared decision making did elect to proceed with subacromial joint injection, patient tolerated well.  She was interested in proceeding with home exercise rehab opposed to formal PT for the shoulder.  We did print out a customized handout for her rotator cuff and shoulder joint, [employer] trainer Lauren [name] did review these in the room with her.  She is back at least 48 hours after injection, perform once daily. She will continue her chronic hydrocodone-acetaminophen 10-325 mg 3 times daily.  Follow-up: F/u in about 3 weeks for eval and treatment Left hip   Meds & Orders: No orders of the defined types were placed in this encounter.   Orders Placed This Encounter  Procedures   Large Joint Inj: R subacromial bursa     Procedures: Large Joint Inj: R subacromial bursa on 08/01/2023 10:29 AM Indications: pain Details: 22 G 1.5 in needle, posterior approach Medications: 2 mL lidocaine 1 %; 2 mL bupivacaine 0.25 %; 40 mg methylPREDNISolone acetate 40 MG/ML Outcome: tolerated well, no immediate complications  Subacromial Joint Injection, Right Shoulder After discussion on risks/benefits/indications, informed verbal consent was obtained. A timeout was then performed. Patient was seated on table in exam room. The patient's shoulder was prepped with betadine and alcohol swabs and utilizing posterior approach a 22G, 1.5" needle was directed anteriorly and laterally into the patient's subacromial space was injected with 2:2:1 mixture of lidocaine:bupivicaine:depomedrol with appreciation of free-flowing  of the injectate into the bursal space. Patient tolerated the procedure well without immediate complications.   Procedure, treatment alternatives, risks and benefits explained, specific risks discussed. Consent was given by the patient. Immediately prior to procedure a time  out was called to verify the correct patient, procedure, equipment, support staff and site/side marked as required. Patient was prepped and draped in the usual sterile fashion.          Clinical History: No specialty comments available.  She reports that she quit smoking about 40 years ago. Her smoking use included cigarettes. She started smoking about 50 years ago. She has a 10 pack-year smoking history. She has never used smokeless tobacco.  Recent Labs    03/23/23 1012 06/08/23 0941  HGBA1C 6.5* 6.0*    Objective:      Physical Exam  Gen: Well-appearing, in no acute distress; non-toxic ZO:[id number]. Warm.  Resp: Breathing unlabored on room air; no wheezing. Psych: Fluid speech in conversation; appropriate affect; normal thought process Neuro: Sensation intact throughout. No gross coordination deficits.   Ortho Exam - Right shoulder: No specific AC joint TTP.  There is positive Hawkins impingement testing, pain with resisted external rotation.  There is no gross weakness with rotator cuff testing.  - Left hip: There is restriction with internal and external logroll.  Positive FADIR test.  Mild TTP over GT.  Imaging:  *3 view x-ray of the right shoulder including 11/16/2021, including AP, scapular Y and axial view was independently reviewed and interpreted by myself today.  X-rays demonstrate moderate AC joint arthritic change, there is minimal glenohumeral joint arthritis although a slightly lower riding humeral head.  No acute fracture noted.  Past Medical/Family/Surgical/Social History: Medications & Allergies reviewed per EMR, new medications updated. Patient Active Problem List   Diagnosis Date Noted   Other hyperlipidemia 06/08/2023   Unilateral primary osteoarthritis, left hip 05/11/2023   Depression 03/16/2023   Allergic rhinitis 12/29/2022   Seasonal affective disorder (HCC) 12/01/2022   BMI 34.0-34.9,adult 12/01/2022   Obesity, Beginning BMI 37.59 12/01/2022    OA (osteoarthritis) of knee 08/03/2022   Status post total right knee replacement 08/03/2022   Stress 07/19/2022   Other constipation 07/19/2022   Atypical chest pain 06/25/2021   Class 2 severe obesity with serious comorbidity and body mass index (BMI) of 37.0 to 37.9 in adult (HCC) 06/03/2021   Vitamin D deficiency 06/03/2021   Other fatigue 04/22/2021   SOB (shortness of breath) on exertion 04/22/2021   Lower extremity edema 03/05/2021   Coronary artery calcification 02/20/2020   Pure hypercholesterolemia 02/20/2020   Unilateral primary osteoarthritis, right knee 01/03/2019   Genetic testing 12/07/2018   Family history of breast cancer    Family history of prostate cancer    Family history of colon cancer    Family history of kidney cancer    Malignant neoplasm of upper-outer quadrant of right breast in female, 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/09/2018   Diabetic neuropathy, type II diabetes mellitus (HCC) 11/09/2018   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/02/2017   Trochanteric bursitis, right hip 05/10/2017   Status post total replacement of right hip 11/26/2016   DDD (degenerative disc disease), 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   Low back pain 04/23/2015   Hiatal hernia 04/13/2013   Diabetes mellitus (HCC) 01/26/2013   Umbilical hernia 07/24/2012   Essential hypertension    Past Medical History:  Diagnosis Date   Anxiety    Arthritis    Back, Hip- right   Atypical chest  pain 06/25/2021   Back pain    Cancer (HCC)    right breast   Cataract    Colitis    Coronary artery calcification 02/20/2020   Diabetes mellitus without complication (HCC)    Type II   Edema 03/05/2021   Edema of both lower extremities    Endometriosis    Family history of adverse reaction to anesthesia    Father - N/V - blood pressure; daughter N/V   Family history of breast cancer    Family history of colon cancer    Family history of kidney cancer    Family history of prostate cancer     Fatty liver    GERD (gastroesophageal reflux disease)    History of hiatal hernia    History of kidney stones    History of ulcerative colitis    Hypertension    Insomnia    Joint pain    Kidney problem    Lower extremity edema 03/05/2021   Lumbar disc disease    L4 L5   Neuropathy    Osteoarthritis    Other hyperlipidemia    Palpitations    Personal history of radiation therapy    Pneumonia    1983, 2003   Pure hypercholesterolemia 02/20/2020   PVCs (premature ventricular contractions)    benign   Umbilical hernia    Vitamin D deficiency    Wears glasses    Family History  Problem Relation Age of Onset   Hyperlipidemia Mother    Diabetes Mother    Hypertension Mother    Parkinson's disease Mother    Kidney disease Mother    Kidney disease Father    Stroke Father    Heart failure Father    Hypertension Father    Heart disease Father    Diabetes Father    Cancer Father        colon, prostate, and kidney   Heart disease Maternal Grandmother    Diabetes Maternal Grandmother    Heart disease Maternal Grandfather    Diabetes Maternal Grandfather    Cancer Maternal Grandfather        pt unaware of what kind   Stroke Paternal Grandmother    Stroke Paternal Grandfather    Cancer Maternal Aunt        cervical vs ovarian   Colon cancer Paternal Aunt        dx less than 50   Breast cancer Paternal Aunt        dx in her 51s   Prostate cancer Paternal Uncle    Breast cancer Cousin        pat first cousin, dx over 15   Prostate cancer Other        PGF's father   Past Surgical History:  Procedure Laterality Date   APPENDECTOMY     BREAST BIOPSY  08-13-2009  DR TSUEI   EXCISION LEFT NIPPLE DUCT   BREAST EXCISIONAL BIOPSY Left    x2   BREAST EXCISIONAL BIOPSY Right    BREAST LUMPECTOMY Right 11/16/2018   invasive ductal   CHOLECYSTECTOMY  1992   COLON RESECTION  1986   ULCERATIVE COLITIS   COLON SURGERY     ESOPHAGEAL MANOMETRY N/A 06/11/2013   Procedure:  ESOPHAGEAL MANOMETRY (EM);  Surgeon: Petra Kuba, MD;  Location: [facility];  Service: Endoscopy;  Laterality: N/A;   ESOPHAGOGASTRODUODENOSCOPY (EGD) WITH PROPOFOL N/A 05/31/2016   Procedure: ESOPHAGOGASTRODUODENOSCOPY (EGD) WITH PROPOFOL;  Surgeon: Vida Rigger, MD;  Location: [facility] ENDOSCOPY;  Service: Endoscopy;  Laterality: N/A;   EXCISION RIGHT BREAST MASS   10-20-2005  DR Theron Arista YOUNG   EYE SURGERY Bilateral 2023   cataract   FRACTURE SURGERY     left arm   kidney stone removed  12/2009   KNEE ARTHROSCOPY Left    MCL   LEFT THUMB CARPOMETACARPAL JOINT SUSPENSIONPLASTY  04-06-2006  DR Mina Marble   LEFT WRIST ARTHROSCOPY W/ DEBRIDEMENT AND REMOVAL CYST  03-26-2004  DR Mina Marble   MYOMECTOMY     RADIOACTIVE SEED GUIDED PARTIAL MASTECTOMY WITH AXILLARY SENTINEL LYMPH NODE BIOPSY Right 11/16/2018   Procedure: RIGHT BREAST RADIOACTIVE SEED GUIDED PARTIAL MASTECTOMY WITH  SENTINEL  NODE BIOPSY;  Surgeon: Abigail Miyamoto, MD;  Location: [facility] OR;  Service: General;  Laterality: Right;   RIGHT COLECTOMY     RIGHT URETEROSCOPIC STONE EXTRACTION  12-11-2009  DR Jonny Ruiz [facility]   TENDON RELEASE     TONSILLECTOMY     TOTAL HIP ARTHROPLASTY Right 11/26/2016   Procedure: RIGHT TOTAL HIP ARTHROPLASTY ANTERIOR APPROACH;  Surgeon: Kathryne Hitch, MD;  Location: [facility] ORS;  Service: Orthopedics;  Laterality: Right;   TOTAL KNEE ARTHROPLASTY Left 12/02/2017   Procedure: LEFT TOTAL KNEE ARTHROPLASTY;  Surgeon: Kathryne Hitch, MD;  Location: [facility];  Service: Orthopedics;  Laterality: Left;   TOTAL KNEE ARTHROPLASTY Right 08/03/2022   Procedure: RIGHT TOTAL KNEE ARTHROPLASTY;  Surgeon: Kathryne Hitch, MD;  Location: [facility] OR;  Service: Orthopedics;  Laterality: Right;   WRIST SURGERY     both   Social History   Occupational History   Occupation: Retired  Tobacco Use   Smoking status: Former    Current packs/day: 0.00    Average packs/day: 1 pack/day for 10.0 years (10.0 ttl pk-yrs)     Types: Cigarettes    Start date: 09/22/1972    Quit date: 09/22/1982    Years since quitting: 40.8   Smokeless tobacco: Never   Tobacco comments:    quit 1983  Vaping Use   Vaping status: Never Used  Substance and Sexual Activity   Alcohol use: No   Drug use: No   Sexual activity: Yes    Partners: Male    Birth control/protection: Post-menopausal

## 2023-08-03 ENCOUNTER — Ambulatory Visit (INDEPENDENT_AMBULATORY_CARE_PROVIDER_SITE_OTHER): Payer: PPO | Admitting: Family Medicine

## 2023-08-03 ENCOUNTER — Encounter (INDEPENDENT_AMBULATORY_CARE_PROVIDER_SITE_OTHER): Payer: Self-pay | Admitting: Family Medicine

## 2023-08-03 VITALS — BP 130/78 | HR 68 | Temp 98.2°F | Ht 64.0 in | Wt 195.0 lb

## 2023-08-03 DIAGNOSIS — E1140 Type 2 diabetes mellitus with diabetic neuropathy, unspecified: Secondary | ICD-10-CM

## 2023-08-03 DIAGNOSIS — E559 Vitamin D deficiency, unspecified: Secondary | ICD-10-CM

## 2023-08-03 DIAGNOSIS — Z7985 Long-term (current) use of injectable non-insulin antidiabetic drugs: Secondary | ICD-10-CM

## 2023-08-03 DIAGNOSIS — F5089 Other specified eating disorder: Secondary | ICD-10-CM

## 2023-08-03 DIAGNOSIS — Z6833 Body mass index (BMI) 33.0-33.9, adult: Secondary | ICD-10-CM

## 2023-08-03 DIAGNOSIS — J3089 Other allergic rhinitis: Secondary | ICD-10-CM

## 2023-08-03 DIAGNOSIS — E669 Obesity, unspecified: Secondary | ICD-10-CM | POA: Diagnosis not present

## 2023-08-03 DIAGNOSIS — F3289 Other specified depressive episodes: Secondary | ICD-10-CM

## 2023-08-03 MED ORDER — FLUTICASONE PROPIONATE 50 MCG/ACT NA SUSP
2.0000 | Freq: Every day | NASAL | 0 refills | Status: DC | PRN
Start: 2023-08-03 — End: 2024-02-07

## 2023-08-03 MED ORDER — TIRZEPATIDE 2.5 MG/0.5ML ~~LOC~~ SOAJ
2.5000 mg | SUBCUTANEOUS | 0 refills | Status: DC
Start: 2023-08-03 — End: 2023-09-22

## 2023-08-03 MED ORDER — ROSUVASTATIN CALCIUM 20 MG PO TABS: 20.0000 mg | ORAL_TABLET | Freq: Every day | ORAL | 0 refills | Status: DC

## 2023-08-03 MED ORDER — ESCITALOPRAM OXALATE 20 MG PO TABS: 20.0000 mg | ORAL_TABLET | Freq: Every day | ORAL | 0 refills | Status: DC

## 2023-08-03 MED ORDER — VITAMIN D (ERGOCALCIFEROL) 1.25 MG (50000 UNIT) PO CAPS: 50000.0000 [IU] | ORAL_CAPSULE | ORAL | 0 refills | Status: DC

## 2023-08-03 NOTE — Progress Notes (Unsigned)
Chief Complaint:   OBESITY Lauren [name] is here to discuss her progress with her obesity treatment plan along with follow-up of her obesity related diagnoses. Lauren [name] is on the Category 2 Plan and states she is following her eating plan approximately 60% of the time. Lauren [name] states she is trying to walk and do ortho exercises for 30 minutes 3 times per week.  Today's visit was #: 28 Starting weight: 219 lbs Starting date: 04/22/2021 Today's weight: 195 lbs Today's date: 08/03/2023 Total lbs lost to date: 24 Total lbs lost since last in-office visit: 7  Interim History: Patient has done well with her weight loss.  She is struggling to meet her protein goals.  She will be having hip surgery soon and she is working on getting more protein in to help with healing.  Subjective:   1. Type 2 diabetes mellitus with diabetic neuropathy, without long-term current use of insulin (HCC) Patient is working on her diet and weight loss.  She notes increased GI upset on higher dose at 5 mg of Mounjaro.  2. Allergic rhinitis due to other allergic trigger, unspecified seasonality Patient is doing well on Flonase, and she requests a refill.  3. Vitamin D deficiency Patient is on vitamin D, and she denies nausea, vomiting, or muscle weakness.  4. Emotional Eating Behavior Patient is working on decreasing emotional eating behavior.  She is doing well on Lexapro with no side effects noted.  Assessment/Plan:   1. Type 2 diabetes mellitus with diabetic neuropathy, without long-term current use of insulin (HCC) Patient will continue Mounjaro at 2.5 mg once weekly, and we will refill for 1 month.  - tirzepatide [facility]) 2.5 MG/0.5ML Pen; Inject 2.5 mg into the skin once a week.  Dispense: 2 mL; Refill: 0   2. Allergic rhinitis due to other allergic trigger, unspecified seasonality Patient will continue Flonase 50 mcg, and we will refill for 1 month.  - fluticasone (FLONASE) 50 MCG/ACT nasal spray;  Place 2 sprays into both nostrils daily as needed for allergies or rhinitis.  Dispense: 9.9 mL; Refill: 0  3. Vitamin D deficiency Patient will continue prescription vitamin D 50,000 IU once every other week, and we will refill for 90 days.  - Vitamin D, Ergocalciferol, (DRISDOL) 1.25 MG (50000 UNIT) CAPS capsule; Take 1 capsule (50,000 Units total) by mouth every 14 (fourteen) days.  Dispense: 6 capsule; Refill: 0   4. Emotional Eating Behavior Patient will continue Lexapro 20 mg once daily, and we will refill for 1 month.  - escitalopram (LEXAPRO) 20 MG tablet; Take 1 tablet (20 mg total) by mouth daily.  Dispense: 30 tablet; Refill: 0  5. BMI 33.0-33.9,adult  6. Obesity, Beginning BMI 37.59 Lauren [name] is currently in the action stage of change. As such, her goal is to continue with weight loss efforts. She has agreed to the Category 2 Plan.   High protein recipes were given.   Exercise goals: As is.   Behavioral modification strategies: increasing lean protein intake, no skipping meals, and meal planning and cooking strategies.  Lauren [name] has agreed to follow-up with our clinic in 4 weeks. She was informed of the importance of frequent follow-up visits to maximize her success with intensive lifestyle modifications for her multiple health conditions.   Objective:   Blood pressure 130/78, pulse 68, temperature 98.2 F (36.8 C), height 5\' 4"  (1.626 m), weight 195 lb (88.5 kg), SpO2 94%. Body mass index is 33.47 kg/m.  Lab Results  Component Value Date  CREATININE 0.91 06/08/2023   BUN 14 06/08/2023   NA 132 (L) 06/08/2023   K 3.7 06/08/2023   CL 91 (L) 06/08/2023   CO2 24 06/08/2023   Lab Results  Component Value Date   ALT 16 06/08/2023   AST 19 06/08/2023   ALKPHOS 58 06/08/2023   BILITOT 0.4 06/08/2023   Lab Results  Component Value Date   HGBA1C 6.0 (H) 06/08/2023   HGBA1C 6.5 (H) 03/23/2023   HGBA1C 6.1 (H) 07/23/2022   HGBA1C 6.1 (H) 05/11/2022   HGBA1C 5.9 (H)  12/10/2021   Lab Results  Component Value Date   INSULIN 25.6 (H) 06/08/2023   INSULIN 11.1 05/11/2022   INSULIN 13.3 12/10/2021   INSULIN 22.3 04/22/2021   Lab Results  Component Value Date   TSH 2.150 05/11/2022   Lab Results  Component Value Date   CHOL 123 06/08/2023   HDL 67 06/08/2023   LDLCALC 41 06/08/2023   TRIG 75 06/08/2023   Lab Results  Component Value Date   VD25OH 100.0 06/08/2023   VD25OH 55.8 12/01/2022   VD25OH 69.3 05/11/2022   Lab Results  Component Value Date   WBC 7.8 08/04/2022   HGB 9.5 (L) 08/04/2022   HCT 27.9 (L) 08/04/2022   MCV 88.9 08/04/2022   PLT 205 08/04/2022   No results found for: "IRON", "TIBC", "FERRITIN"  Attestation Statements:   Reviewed by clinician on day of visit: allergies, medications, problem list, medical history, surgical history, family history, social history, and previous encounter notes.   I, Burt Knack, am acting as transcriptionist for [name], MD.  I have reviewed the above documentation for accuracy and completeness, and I agree with the above. -  [name], MD

## 2023-08-10 DIAGNOSIS — M47817 Spondylosis without myelopathy or radiculopathy, lumbosacral region: Secondary | ICD-10-CM | POA: Diagnosis not present

## 2023-08-10 DIAGNOSIS — M25551 Pain in right hip: Secondary | ICD-10-CM | POA: Diagnosis not present

## 2023-08-10 DIAGNOSIS — M25552 Pain in left hip: Secondary | ICD-10-CM | POA: Diagnosis not present

## 2023-08-10 DIAGNOSIS — M150 Primary generalized (osteo)arthritis: Secondary | ICD-10-CM | POA: Diagnosis not present

## 2023-08-15 ENCOUNTER — Encounter: Payer: Self-pay | Admitting: Sports Medicine

## 2023-08-15 ENCOUNTER — Other Ambulatory Visit: Payer: Self-pay

## 2023-08-15 ENCOUNTER — Ambulatory Visit: Payer: PPO | Admitting: Sports Medicine

## 2023-08-15 DIAGNOSIS — Z96641 Presence of right artificial hip joint: Secondary | ICD-10-CM | POA: Diagnosis not present

## 2023-08-15 DIAGNOSIS — M1612 Unilateral primary osteoarthritis, left hip: Secondary | ICD-10-CM

## 2023-08-15 MED ORDER — METHYLPREDNISOLONE ACETATE 40 MG/ML IJ SUSP
40.0000 mg | INTRAMUSCULAR | Status: AC | PRN
Start: 2023-08-15 — End: 2023-08-15
  Administered 2023-08-15: 40 mg via INTRA_ARTICULAR

## 2023-08-15 MED ORDER — LIDOCAINE HCL 1 % IJ SOLN
4.0000 mL | INTRAMUSCULAR | Status: AC | PRN
Start: 2023-08-15 — End: 2023-08-15
  Administered 2023-08-15: 4 mL

## 2023-08-15 NOTE — Progress Notes (Signed)
Lauren [name] - 77 y.o. female MRN [id number]  Date of birth: 1946-06-02  Office Visit Note: Visit Date: 08/15/2023 PCP: Daisy Floro, MD Referred by: Daisy Floro, MD  Subjective: Chief Complaint  Patient presents with   Left Hip - Pain   HPI: Lauren [name] is a pleasant 77 y.o. female who presents today for chronic left hip pain with known OA. as bothered her chronically.  She is likely planning a left total hip replacement around January 2025.  She is looking for some relief prior to this, has received back injections in the past with temporary relief.  He does have around hip exercises as she does for both hips. She continues on her chronic hydrocodone/acetaminophen 10-325 mg 3 times daily.   Pertinent ROS were reviewed with the patient and found to be negative unless otherwise specified above in HPI.   Assessment & Plan: Visit Diagnoses:  1. Unilateral primary osteoarthritis, left hip   2. History of right hip replacement    Plan: Discussed with Lauren [name] the nature of her advanced left hip osteoarthritis.  She is at the point she is likely planning to pursue total hip arthroplasty, she is looking for some relief until January.  Through shared decision-making, we did proceed with ultrasound-guided intra-articular hip injection.  We also pulled off about 2 cc of fluid from a small hip effusion during this procedure.  She may use ice, over-the-counter anti-inflammatories for any postinjection pain.  May continue her chronic hydrocodone/acetaminophen 10-325 mg 3 times daily as needed.  After the next 2-3 days, she may return to her home hip exercises.  She is interested in being evaluated for the contralateral hip with treatment for that, she may make an appointment for this over the next 1 to 2 weeks.  Follow-up: Return for f/u 1-2 weeks for R-hip (reg visit).   Meds & Orders: No orders of the defined types were placed in this encounter.   Orders Placed This  Encounter  Procedures   US Guided Needle Placement - No Linked Charges     Procedures: Large Joint Inj: L hip joint on 08/15/2023 11:11 AM Indications: pain Details: 22 G 3.5 in needle, ultrasound-guided anterior approach Medications: 4 mL lidocaine 1 %; 40 mg methylPREDNISolone acetate 40 MG/ML Aspirate: 2 mL blood-tinged and clear Outcome: tolerated well, no immediate complications  Procedure: US-guided intra-articular hip injection, left After discussion on risks/benefits/indications and informed verbal consent was obtained, a timeout was performed. Patient was lying supine on exam table. The hip was cleaned with betadine and alcohol swabs. Then utilizing ultrasound guidance, the patient's femoral head and neck junction was identified and subsequently injected with 4:1 lidocaine:depomedrol via an in-plane approach with ultrasound visualization of the injectate administered into the hip joint. Patient tolerated procedure well without immediate complications.  Procedure, treatment alternatives, risks and benefits explained, specific risks discussed. Consent was given by the patient. Immediately prior to procedure a time out was called to verify the correct patient, procedure, equipment, support staff and site/side marked as required. Patient was prepped and draped in the usual sterile fashion.          Clinical History: No specialty comments available.  She reports that she quit smoking about 40 years ago. Her smoking use included cigarettes. She started smoking about 50 years ago. She has a 10 pack-year smoking history. She has never used smokeless tobacco.  Recent Labs    03/23/23 1012 06/08/23 0941  HGBA1C 6.5* 6.0*  Objective:    Physical Exam  Gen: Well-appearing, in no acute distress; non-toxic CV:  Well-perfused. Warm.  Resp: Breathing unlabored on room air; no wheezing. Psych: Fluid speech in conversation; appropriate affect; normal thought process Neuro: Sensation  intact throughout. No gross coordination deficits.   Ortho Exam - Left hip: No anterior joint tenderness to palpation, no effusion noted.  There is pain and limitation with internal and external logroll, positive FADIR test.  Imaging:  Narrative & Impression  CLINICAL DATA:  Right hip replacement. Chronic left hip pain. Right hip pain for 4 months.   EXAM: MR OF THE RIGHT HIP WITHOUT CONTRAST   MR OF THE LEFT HIP WITHOUT CONTRAST   TECHNIQUE: Multiplanar, multisequence MR imaging of the right hip was performed. No intravenous contrast was administered.   Multiplanar, multisequence MR imaging of the left hip was performed. No intravenous contrast was administered.   COMPARISON:  None Available.   FINDINGS: Bones:   Right total hip arthroplasty with susceptibility artifact partially obscuring the adjacent soft tissue and osseous structures. No periarticular fluid collection or osteolysis. No acute fracture or dislocation.   No left hip fracture, dislocation or avascular necrosis.   No periosteal reaction or bone destruction. No aggressive osseous lesion.   Normal sacrum and sacroiliac joints. No SI joint widening or erosive changes.   Articular cartilage and labrum   Articular cartilage: High-grade partial-thickness cartilage loss with areas of full-thickness cartilage loss of the left femoral head and acetabulum and subchondral reactive marrow edema in the left femoral head.   Labrum:  Right anterior labral tear.   Joint or bursal effusion   Joint effusion: Moderate left hip joint effusion. No SI joint effusion.   Bursae:  No bursal fluid.   Muscles and tendons   Flexors: Normal.   Extensors: Normal.   Abductors: Normal.   Adductors: Normal.   Gluteals: Atrophy of the left gluteus minimus muscle. Chronic tear of the left gluteus minimus tendon insertion.   Hamstrings: Small partial-thickness tear of the hamstring origin bilaterally.   Other  findings   No pelvic free fluid. No fluid collection or hematoma. No inguinal lymphadenopathy. No inguinal hernia.   IMPRESSION: 1. Right total hip arthroplasty without periarticular fluid collection or osteolysis. No hardware failure or complication. 2. Severe osteoarthritis of the left hip. Moderate left hip joint effusion. 3. Small partial-thickness tear of the hamstring origin bilaterally.     Electronically Signed   By: Elige Ko M.D.   On: 05/06/2023 10:55   Past Medical/Family/Surgical/Social History: Medications & Allergies reviewed per EMR, new medications updated. 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/03/2022   Stress 07/19/2022   Other constipation 07/19/2022   Atypical chest pain 06/25/2021   Class 2 severe obesity with serious comorbidity and body mass index (BMI) of 37.0 to 37.9 in adult [facility]) 06/03/2021   Vitamin D deficiency 06/03/2021   Other fatigue 04/22/2021   SOB (shortness of breath) on exertion 04/22/2021   Lower extremity edema 03/05/2021   Coronary artery calcification 02/20/2020   Pure hypercholesterolemia 02/20/2020   Unilateral primary osteoarthritis, right knee 01/03/2019   Genetic testing 12/07/2018   Family  history of breast cancer    Family history of prostate cancer    Family history of colon cancer    Family history of kidney cancer    Malignant neoplasm of upper-outer quadrant of right breast in female, 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/09/2018   Diabetic neuropathy, type II diabetes mellitus (HCC) 11/09/2018   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/02/2017    Trochanteric bursitis, right hip 05/10/2017   Status post total replacement of right hip 11/26/2016   DDD (degenerative disc disease), 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   Low back pain 04/23/2015   Hiatal hernia 04/13/2013   Diabetes mellitus (HCC) 01/26/2013   Umbilical hernia 07/24/2012   Essential hypertension    Past Medical History:  Diagnosis Date   Anxiety    Arthritis    Back, Hip- right   Atypical chest pain 06/25/2021   Back pain    Cancer (HCC)    right breast   Cataract    Colitis    Coronary artery calcification 02/20/2020   Diabetes mellitus without complication (HCC)    Type II   Edema 03/05/2021   Edema of both lower extremities    Endometriosis    Family history of adverse reaction to anesthesia    Father - N/V - blood pressure; daughter N/V   Family history of breast cancer    Family history of colon cancer    Family history of kidney cancer    Family history of prostate cancer    Fatty liver    GERD (gastroesophageal reflux disease)    History of hiatal hernia    History of kidney stones    History of ulcerative colitis    Hypertension    Insomnia    Joint pain    Kidney problem    Lower extremity edema 03/05/2021   Lumbar disc disease    L4 L5   Neuropathy    Osteoarthritis    Other hyperlipidemia    Palpitations    Personal history of radiation therapy    Pneumonia    1983, 2003   Pure hypercholesterolemia 02/20/2020   PVCs (premature ventricular contractions)    benign   Umbilical hernia    Vitamin D deficiency    Wears glasses    Family History  Problem Relation Age of Onset   Hyperlipidemia Mother    Diabetes Mother    Hypertension Mother    Parkinson's disease Mother    Kidney disease Mother    Kidney disease Father    Stroke Father    Heart failure Father    Hypertension Father    Heart disease Father    Diabetes Father    Cancer Father        colon, prostate, and kidney   Heart disease Maternal Grandmother    Diabetes Maternal  Grandmother    Heart disease Maternal Grandfather    Diabetes Maternal Grandfather    Cancer Maternal Grandfather        pt unaware of what kind   Stroke Paternal Grandmother    Stroke Paternal Grandfather    Cancer Maternal Aunt        cervical vs ovarian   Colon cancer Paternal Aunt        dx less than 50   Breast cancer Paternal Aunt        dx in her 44s   Prostate cancer Paternal Uncle    Breast cancer Cousin        pat first cousin, dx over 76  Prostate cancer Other        PGF's father   Past Surgical History:  Procedure Laterality Date   APPENDECTOMY     BREAST BIOPSY  08-13-2009  DR TSUEI   EXCISION LEFT NIPPLE DUCT   BREAST EXCISIONAL BIOPSY Left    x2   BREAST EXCISIONAL BIOPSY Right    BREAST LUMPECTOMY Right 11/16/2018   invasive ductal   CHOLECYSTECTOMY  1992   COLON RESECTION  1986   ULCERATIVE COLITIS   COLON SURGERY     ESOPHAGEAL MANOMETRY N/A 06/11/2013   Procedure: ESOPHAGEAL MANOMETRY (EM);  Surgeon: Petra Kuba, MD;  Location: [facility] END[facility]COPY;  Service: Endoscopy;  Laterality: N/A;   ESOPHAGOGASTRODUODENOSCOPY (EGD) WITH PROPOFOL N/A 05/31/2016   Procedure: ESOPHAGOGASTRODUODENOSCOPY (EGD) WITH PROPOFOL;  Surgeon: Vida Rigger, MD;  Location: [facility] ENDOSCOPY;  Service: Endoscopy;  Laterality: N/A;   EXCISION RIGHT BREAST MASS   10-20-2005  DR Theron Arista YOUNG   EYE SURGERY Bilateral 2023   cataract   FRACTURE SURGERY     left arm   kidney stone removed  12/2009   KNEE ARTHROSCOPY Left    MCL   LEFT THUMB CARPOMETACARPAL JOINT SUSPENSIONPLASTY  04-06-2006  DR Mina Marble   LEFT WRIST ARTHROSCOPY W/ DEBRIDEMENT AND REMOVAL CYST  03-26-2004  DR Mina Marble   MYOMECTOMY     RADIOACTIVE SEED GUIDED PARTIAL MASTECTOMY WITH AXILLARY SENTINEL LYMPH NODE BIOPSY Right 11/16/2018   Procedure: RIGHT BREAST RADIOACTIVE SEED GUIDED PARTIAL MASTECTOMY WITH  SENTINEL  NODE BIOPSY;  Surgeon: Abigail Miyamoto, MD;  Location: [facility] OR;  Service: General;  Laterality: Right;   RIGHT  COLECTOMY     RIGHT URETEROSCOPIC STONE EXTRACTION  12-11-2009  DR Jonny Ruiz [facility]   TENDON RELEASE     TONSILLECTOMY     TOTAL HIP ARTHROPLASTY Right 11/26/2016   Procedure: RIGHT TOTAL HIP ARTHROPLASTY ANTERIOR APPROACH;  Surgeon: Kathryne Hitch, MD;  Location: [facility] ORS;  Service: Orthopedics;  Laterality: Right;   TOTAL KNEE ARTHROPLASTY Left 12/02/2017   Procedure: LEFT TOTAL KNEE ARTHROPLASTY;  Surgeon: Kathryne Hitch, MD;  Location: [facility] ORS;  Service: Orthopedics;  Laterality: Left;   TOTAL KNEE ARTHROPLASTY Right 08/03/2022   Procedure: RIGHT TOTAL KNEE ARTHROPLASTY;  Surgeon: Kathryne Hitch, MD;  Location: [facility] OR;  Service: Orthopedics;  Laterality: Right;   WRIST SURGERY     both   Social History   Occupational History   Occupation: Retired  Tobacco Use   Smoking status: Former    Current packs/day: 0.00    Average packs/day: 1 pack/day for 10.0 years (10.0 ttl pk-yrs)    Types: Cigarettes    Start date: 09/22/1972    Quit date: 09/22/1982    Years since quitting: 40.9   Smokeless tobacco: Never   Tobacco comments:    quit 1983  Vaping Use   Vaping status: Never Used  Substance and Sexual Activity   Alcohol use: No   Drug use: No   Sexual activity: Yes    Partners: Male    Birth control/protection: Post-menopausal

## 2023-08-29 ENCOUNTER — Encounter: Payer: Self-pay | Admitting: Sports Medicine

## 2023-08-29 ENCOUNTER — Ambulatory Visit: Payer: PPO | Admitting: Sports Medicine

## 2023-08-29 DIAGNOSIS — M1612 Unilateral primary osteoarthritis, left hip: Secondary | ICD-10-CM

## 2023-08-29 DIAGNOSIS — G5702 Lesion of sciatic nerve, left lower limb: Secondary | ICD-10-CM

## 2023-08-29 DIAGNOSIS — S76012S Strain of muscle, fascia and tendon of left hip, sequela: Secondary | ICD-10-CM | POA: Diagnosis not present

## 2023-08-29 MED ORDER — METHYLPREDNISOLONE 4 MG PO TBPK
ORAL_TABLET | ORAL | 0 refills | Status: DC
Start: 2023-08-29 — End: 2023-09-22

## 2023-08-29 NOTE — Progress Notes (Signed)
Lauren [name] - 77 y.o. female MRN [id number]  Date of birth: 29-May-1946  Office Visit Note: Visit Date: 08/29/2023 PCP: Daisy Floro, MD Referred by: Daisy Floro, MD  Subjective: Chief Complaint  Patient presents with   Right Hip - Follow-up   Left Hip - Pain   HPI: Lauren [name] is a pleasant 77 y.o. female who presents today for evaluation of right lateral hip pain, left hip OA follow-up.  Left hip - has known OA.  A few weeks ago we did proceed with ultrasound-guided intra-articular hip injection, this has been helpful for her. She is planning likely surgical hip replacement with Dr. Magnus Ivan starting in early 2025.  She however has had a really significant pain in the left buttock and posterior hip region.  This feels very different than her hip arthritis pain as this is feeling good from the injection.  Her pain started late Thursday night and worsened Friday.  She has been limping and using a cane to ambulate which is not typical for her.  She denies any injury, fall or inciting events.  She occasionally will get some tingling in the toes but denies any radiating pain from the back and down the leg.   Pertinent ROS were reviewed with the patient and found to be negative unless otherwise specified above in HPI.   Assessment & Plan: Visit Diagnoses:  1. Unilateral primary osteoarthritis, left hip   2. Tear of left gluteus minimus tendon, sequela   3. Piriformis syndrome of left side    Plan: Lauren [name] has severe left hip OA but this has responded well to recent intra-articular injection.  She does have some gluteus medius/minimus tearing insertion of the lateral hip pain that has spotted well to shockwave in the past, however here over the last few days she has had quite significant pain over the left posterior buttock which feels different than her typical hip OA pain.  Impression is most likely aggravation of piriformis muscle on the left side.  Through shared  decision making, we did perform a piriformis she injection today.  Given the degree of her pain as well, we will start her on a 6-day Medrol Dosepak.  May use over-the-counter Tylenol, recommend heat as well.  The degree of her pain limited a 4 physical exam and provocative motions, I would like to see her back in 1 week to reevaluate how she is doing.  If for some reason she is still having significant pain, could consider an MRI of the pelvis.  She has had issues with her low back before, her exam does not suggest radicular symptoms but will reevaluate this at next week's visit. Return precautions provided.  Follow-up: Return in about 1 week (around 09/05/2023) for left buttock/posterior hip pain.   Meds & Orders:  Meds ordered this encounter  Medications   methylPREDNISolone (MEDROL DOSEPAK) 4 MG TBPK tablet    Sig: Take per packet instructions. Taper dosing.    Dispense:  1 each    Refill:  0   No orders of the defined types were placed in this encounter.    Procedures: Piriformis tendon sheath injection, left After discussion on risk/benefits/indications, an informed verbal consent was obtained and a timeout was performed. The patient was lying supine on exam table. The area overlying the piriformis muscle belly was palpated and identified and prepped with Betadine and alcohol swabs. Then using a 25G, 1.5" needle the piriformis sheath was injected with 2:2:1 lidocaine:bupivicaine:betamethasone. Patient tolerated  procedure well without immediate complications.      Clinical History: No specialty comments available.  She reports that she quit smoking about 40 years ago. Her smoking use included cigarettes. She started smoking about 50 years ago. She has a 10 pack-year smoking history. She has never used smokeless tobacco.  Recent Labs    03/23/23 1012 06/08/23 0941  HGBA1C 6.5* 6.0*    Objective:    Physical Exam  Gen: Well-appearing, in no acute distress; non-toxic CV:   Well-perfused. Warm.  Resp: Breathing unlabored on room air; no wheezing. Psych: Fluid speech in conversation; appropriate affect; normal thought process Neuro: Sensation intact throughout. No gross coordination deficits.   Ortho Exam - Low back/Posterior Left hip: Patient walks with a significant limp with the use of a cane.  There is tenderness to palpation over the superior left buttock and the piriformis muscle.  There is no TTP over the SI joint or the ischial tuberosity region.  Mild greater trochanter TTP.  There is a negative straight leg raise bilaterally.  There is discomfort with FABER testing.  Other provocative maneuvers were not performed today given her discomfort.  Imaging:  MR Hip Right w/o contrast CLINICAL DATA:  Right hip replacement. Chronic left hip pain. Right hip pain for 4 months.  EXAM: MR OF THE RIGHT HIP WITHOUT CONTRAST  MR OF THE LEFT HIP WITHOUT CONTRAST  TECHNIQUE: Multiplanar, multisequence MR imaging of the right hip was performed. No intravenous contrast was administered.  Multiplanar, multisequence MR imaging of the left hip was performed. No intravenous contrast was administered.  COMPARISON:  None Available.  FINDINGS: Bones:  Right total hip arthroplasty with susceptibility artifact partially obscuring the adjacent soft tissue and osseous structures. No periarticular fluid collection or osteolysis. No acute fracture or dislocation.  No left hip fracture, dislocation or avascular necrosis.  No periosteal reaction or bone destruction. No aggressive osseous lesion.  Normal sacrum and sacroiliac joints. No SI joint widening or erosive changes.  Articular cartilage and labrum  Articular cartilage: High-grade partial-thickness cartilage loss with areas of full-thickness cartilage loss of the left femoral head and acetabulum and subchondral reactive marrow edema in the left femoral head.  Labrum:  Right anterior labral tear.  Joint  or bursal effusion  Joint effusion: Moderate left hip joint effusion. No SI joint effusion.  Bursae:  No bursal fluid.  Muscles and tendons  Flexors: Normal.  Extensors: Normal.  Abductors: Normal.  Adductors: Normal.  Gluteals: Atrophy of the left gluteus minimus muscle. Chronic tear of the left gluteus minimus tendon insertion.  Hamstrings: Small partial-thickness tear of the hamstring origin bilaterally.  Other findings  No pelvic free fluid. No fluid collection or hematoma. No inguinal lymphadenopathy. No inguinal hernia.  IMPRESSION: 1. Right total hip arthroplasty without periarticular fluid collection or osteolysis. No hardware failure or complication. 2. Severe osteoarthritis of the left hip. Moderate left hip joint effusion. 3. Small partial-thickness tear of the hamstring origin bilaterally.  Electronically Signed   By: Elige Ko M.D.   On: 05/06/2023 10:55 MR Hip Left w/o contrast CLINICAL DATA:  Right hip replacement. Chronic left hip pain. Right hip pain for 4 months.  EXAM: MR OF THE RIGHT HIP WITHOUT CONTRAST  MR OF THE LEFT HIP WITHOUT CONTRAST  TECHNIQUE: Multiplanar, multisequence MR imaging of the right hip was performed. No intravenous contrast was administered.  Multiplanar, multisequence MR imaging of the left hip was performed. No intravenous contrast was administered.  COMPARISON:  None Available.  FINDINGS: Bones:  Right total hip arthroplasty with susceptibility artifact partially obscuring the adjacent soft tissue and osseous structures. No periarticular fluid collection or osteolysis. No acute fracture or dislocation.  No left hip fracture, dislocation or avascular necrosis.  No periosteal reaction or bone destruction. No aggressive osseous lesion.  Normal sacrum and sacroiliac joints. No SI joint widening or erosive changes.  Articular cartilage and labrum  Articular cartilage: High-grade partial-thickness  cartilage loss with areas of full-thickness cartilage loss of the left femoral head and acetabulum and subchondral reactive marrow edema in the left femoral head.  Labrum:  Right anterior labral tear.  Joint or bursal effusion  Joint effusion: Moderate left hip joint effusion. No SI joint effusion.  Bursae:  No bursal fluid.  Muscles and tendons  Flexors: Normal.  Extensors: Normal.  Abductors: Normal.  Adductors: Normal.  Gluteals: Atrophy of the left gluteus minimus muscle. Chronic tear of the left gluteus minimus tendon insertion.  Hamstrings: Small partial-thickness tear of the hamstring origin bilaterally.  Other findings  No pelvic free fluid. No fluid collection or hematoma. No inguinal lymphadenopathy. No inguinal hernia.  IMPRESSION: 1. Right total hip arthroplasty without periarticular fluid collection or osteolysis. No hardware failure or complication. 2. Severe osteoarthritis of the left hip. Moderate left hip joint effusion. 3. Small partial-thickness tear of the hamstring origin bilaterally.  Electronically Signed   By: Elige Ko M.D.   On: 05/06/2023 10:55  Past Medical/Family/Surgical/Social History: Medications & Allergies reviewed per EMR, new medications updated. 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/03/2022   Stress 07/19/2022   Other constipation 07/19/2022   Atypical chest pain 06/25/2021   Class 2 severe obesity with serious comorbidity and body mass index (BMI) of 37.0 to 37.9 in adult (HCC) 06/03/2021   Vitamin D deficiency 06/03/2021   Other fatigue 04/22/2021    SOB (shortness of breath) on exertion 04/22/2021   Lower extremity edema 03/05/2021   Coronary artery calcification 02/20/2020   Pure hypercholesterolemia 02/20/2020   Unilateral primary osteoarthritis, right knee 01/03/2019   Genetic testing 12/07/2018   Family history of breast cancer    Family history of prostate cancer    Family history of colon cancer    Family history of kidney cancer    Malignant neoplasm of upper-outer quadrant of right breast in female, 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/09/2018   Diabetic neuropathy, type II diabetes mellitus (HCC) 11/09/2018   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/02/2017   Trochanteric bursitis, right hip 05/10/2017   Status post total replacement of right hip 11/26/2016   DDD (degenerative disc disease), 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   Low back pain 04/23/2015   Hiatal hernia 04/13/2013   Diabetes mellitus (HCC) 01/26/2013   Umbilical hernia 07/24/2012   Essential hypertension    Past Medical History:  Diagnosis Date   Anxiety    Arthritis    Back, Hip- right   Atypical chest pain 06/25/2021   Back pain    Cancer (HCC)    right breast   Cataract    Colitis    Coronary artery calcification 02/20/2020   Diabetes mellitus without complication (HCC)    Type II  Edema 03/05/2021   Edema of both lower extremities    Endometriosis    Family history of adverse reaction to anesthesia    Father - N/V - blood pressure; daughter N/V   Family history of breast cancer    Family history of colon cancer    Family history of kidney cancer    Family history of prostate cancer    Fatty liver    GERD (gastroesophageal reflux disease)    History of hiatal hernia    History of kidney stones    History of ulcerative colitis    Hypertension    Insomnia    Joint pain    Kidney problem    Lower extremity edema 03/05/2021   Lumbar disc disease    L4 L5   Neuropathy    Osteoarthritis    Other hyperlipidemia    Palpitations    Personal  history of radiation therapy    Pneumonia    1983, 2003   Pure hypercholesterolemia 02/20/2020   PVCs (premature ventricular contractions)    benign   Umbilical hernia    Vitamin D deficiency    Wears glasses    Family History  Problem Relation Age of Onset   Hyperlipidemia Mother    Diabetes Mother    Hypertension Mother    Parkinson's disease Mother    Kidney disease Mother    Kidney disease Father    Stroke Father    Heart failure Father    Hypertension Father    Heart disease Father    Diabetes Father    Cancer Father        colon, prostate, and kidney   Heart disease Maternal Grandmother    Diabetes Maternal Grandmother    Heart disease Maternal Grandfather    Diabetes Maternal Grandfather    Cancer Maternal Grandfather        pt unaware of what kind   Stroke Paternal Grandmother    Stroke Paternal Grandfather    Cancer Maternal Aunt        cervical vs ovarian   Colon cancer Paternal Aunt        dx less than 50   Breast cancer Paternal Aunt        dx in her 23s   Prostate cancer Paternal Uncle    Breast cancer Cousin        pat first cousin, dx over 28   Prostate cancer Other        PGF's father   Past Surgical History:  Procedure Laterality Date   APPENDECTOMY     BREAST BIOPSY  08-13-2009  DR TSUEI   EXCISION LEFT NIPPLE DUCT   BREAST EXCISIONAL BIOPSY Left    x2   BREAST EXCISIONAL BIOPSY Right    BREAST LUMPECTOMY Right 11/16/2018   invasive ductal   CHOLECYSTECTOMY  1992   COLON RESECTION  1986   ULCERATIVE COLITIS   COLON SURGERY     ESOPHAGEAL MANOMETRY N/A 06/11/2013   Procedure: ESOPHAGEAL MANOMETRY (EM);  Surgeon: Petra Kuba, MD;  Location: [facility];  Service: Endoscopy;  Laterality: N/A;   ESOPHAGOGASTRODUODENOSCOPY (EGD) WITH PROPOFOL N/A 05/31/2016   Procedure: ESOPHAGOGASTRODUODENOSCOPY (EGD) WITH PROPOFOL;  Surgeon: Vida Rigger, MD;  Location: [facility];  Service: Endoscopy;  Laterality: N/A;   EXCISION RIGHT BREAST MASS    10-20-2005  DR Theron Arista YOUNG   EYE SURGERY Bilateral 2023   cataract   FRACTURE SURGERY     left arm   kidney stone removed  12/2009   KNEE ARTHROSCOPY Left    MCL   LEFT THUMB CARPOMETACARPAL JOINT SUSPENSIONPLASTY  04-06-2006  DR Mina Marble   LEFT WRIST ARTHROSCOPY W/ DEBRIDEMENT AND REMOVAL CYST  03-26-2004  DR Mina Marble   MYOMECTOMY     RADIOACTIVE SEED GUIDED PARTIAL MASTECTOMY WITH AXILLARY SENTINEL LYMPH NODE BIOPSY Right 11/16/2018   Procedure: RIGHT BREAST RADIOACTIVE SEED GUIDED PARTIAL MASTECTOMY WITH  SENTINEL  NODE BIOPSY;  Surgeon: Abigail Miyamoto, MD;  Location: [facility] OR;  Service: General;  Laterality: Right;   RIGHT COLECTOMY     RIGHT URETEROSCOPIC STONE EXTRACTION  12-11-2009  DR Jonny Ruiz [facility]   TENDON RELEASE     TONSILLECTOMY     TOTAL HIP ARTHROPLASTY Right 11/26/2016   Procedure: RIGHT TOTAL HIP ARTHROPLASTY ANTERIOR APPROACH;  Surgeon: Kathryne Hitch, MD;  Location: [facility];  Service: Orthopedics;  Laterality: Right;   TOTAL KNEE ARTHROPLASTY Left 12/02/2017   Procedure: LEFT TOTAL KNEE ARTHROPLASTY;  Surgeon: Kathryne Hitch, MD;  Location: [facility];  Service: Orthopedics;  Laterality: Left;   TOTAL KNEE ARTHROPLASTY Right 08/03/2022   Procedure: RIGHT TOTAL KNEE ARTHROPLASTY;  Surgeon: Kathryne Hitch, MD;  Location: [facility];  Service: Orthopedics;  Laterality: Right;   WRIST SURGERY     both   Social History   Occupational History   Occupation: Retired  Tobacco Use   Smoking status: Former    Current packs/day: 0.00    Average packs/day: 1 pack/day for 10.0 years (10.0 ttl pk-yrs)    Types: Cigarettes    Start date: 09/22/1972    Quit date: 09/22/1982    Years since quitting: 40.9   Smokeless tobacco: Never   Tobacco comments:    quit 1983  Vaping Use   Vaping status: Never Used  Substance and Sexual Activity   Alcohol use: No   Drug use: No   Sexual activity: Yes    Partners: Male    Birth control/protection: Post-menopausal

## 2023-08-30 ENCOUNTER — Encounter (INDEPENDENT_AMBULATORY_CARE_PROVIDER_SITE_OTHER): Payer: Self-pay | Admitting: Family Medicine

## 2023-08-30 ENCOUNTER — Other Ambulatory Visit (INDEPENDENT_AMBULATORY_CARE_PROVIDER_SITE_OTHER): Payer: Self-pay | Admitting: Family Medicine

## 2023-08-30 DIAGNOSIS — E1140 Type 2 diabetes mellitus with diabetic neuropathy, unspecified: Secondary | ICD-10-CM

## 2023-09-01 ENCOUNTER — Telehealth (INDEPENDENT_AMBULATORY_CARE_PROVIDER_SITE_OTHER): Payer: PPO | Admitting: Family Medicine

## 2023-09-01 ENCOUNTER — Other Ambulatory Visit (INDEPENDENT_AMBULATORY_CARE_PROVIDER_SITE_OTHER): Payer: Self-pay | Admitting: Family Medicine

## 2023-09-01 VITALS — Ht 64.0 in | Wt 194.0 lb

## 2023-09-01 DIAGNOSIS — F3289 Other specified depressive episodes: Secondary | ICD-10-CM

## 2023-09-01 DIAGNOSIS — E1140 Type 2 diabetes mellitus with diabetic neuropathy, unspecified: Secondary | ICD-10-CM

## 2023-09-01 DIAGNOSIS — Z6833 Body mass index (BMI) 33.0-33.9, adult: Secondary | ICD-10-CM

## 2023-09-01 DIAGNOSIS — E669 Obesity, unspecified: Secondary | ICD-10-CM

## 2023-09-01 DIAGNOSIS — J3089 Other allergic rhinitis: Secondary | ICD-10-CM

## 2023-09-01 DIAGNOSIS — Z7985 Long-term (current) use of injectable non-insulin antidiabetic drugs: Secondary | ICD-10-CM

## 2023-09-01 MED ORDER — TIRZEPATIDE 5 MG/0.5ML ~~LOC~~ SOAJ
5.0000 mg | SUBCUTANEOUS | 0 refills | Status: DC
Start: 2023-09-01 — End: 2023-09-22

## 2023-09-01 MED ORDER — ESCITALOPRAM OXALATE 20 MG PO TABS: 20.0000 mg | ORAL_TABLET | Freq: Every day | ORAL | 0 refills | Status: DC

## 2023-09-01 NOTE — Progress Notes (Addendum)
.smr  Office: [phone]  /  Fax: [phone]  WEIGHT SUMMARY AND BIOMETRICS  Anthropometric Measurements Height: 5\' 4"  (1.626 m) Weight: 194 lb (88 kg) BMI (Calculated): 33.28   No data recorded Other Clinical Data Fasting: No Labs: No Today's Visit #: 30 Starting Date: 04/22/21  I connected with  Lauren [name] on 09/22/23 by a video enabled telemedicine application and verified that I am speaking with the correct person using two identifiers.   I discussed the limitations of evaluation and management by telemedicine. The patient expressed understanding and agreed to proceed.   Chief Complaint: OBESITY   History of Present Illness     Pt was seen for a virtual visit today due to worsening back pain. She feels she has done well avoiding weight gain but hasn't been able to follow her plan very closely recently. She has been on prednisone but her pain is still too great to exercise. Her hunger is mostly controlled but she hasn't been meeting her protein goals.  She is on [facility] for her diabetes. She notes her polyphagia seems to be worsening and her Greggory Keen has not been helping as much as it once did. She had tried to increase her dose to 5.0 previously but had increased nausea.  She feels her mood is stable on lexapro with no noticeable side effects. She requests a 90 day refill of this today.         PHYSICAL EXAM:  Height 5\' 4"  (1.626 m), weight 194 lb (88 kg). Body mass index is 33.3 kg/m.  DIAGNOSTIC DATA REVIEWED:  BMET    Component Value Date/Time   NA 132 (L) 06/08/2023 0941   K 3.7 06/08/2023 0941   CL 91 (L) 06/08/2023 0941   CO2 24 06/08/2023 0941   GLUCOSE 89 06/08/2023 0941   GLUCOSE 127 (H) 08/04/2022 0331   BUN 14 06/08/2023 0941   CREATININE 0.91 06/08/2023 0941   CREATININE 0.87 06/08/2022 1439   CALCIUM 9.5 06/08/2023 0941   GFRNONAA 52 (L) 08/04/2022 0331   GFRNONAA >60 06/08/2022 1439   GFRAA >60 02/04/2020 1212   GFRAA >60  11/09/2018 1405   Lab Results  Component Value Date   HGBA1C 6.0 (H) 06/08/2023   HGBA1C 6.7 (H) 11/16/2016   Lab Results  Component Value Date   INSULIN 25.6 (H) 06/08/2023   INSULIN 22.3 04/22/2021   Lab Results  Component Value Date   TSH 2.150 05/11/2022   CBC    Component Value Date/Time   WBC 7.8 08/04/2022 0331   RBC 3.14 (L) 08/04/2022 0331   HGB 9.5 (L) 08/04/2022 0331   HGB 12.3 06/08/2022 1439   HGB 11.8 05/11/2022 0954   HCT 27.9 (L) 08/04/2022 0331   HCT 35.1 05/11/2022 0954   PLT 205 08/04/2022 0331   PLT 272 06/08/2022 1439   PLT 265 05/11/2022 0954   MCV 88.9 08/04/2022 0331   MCV 87 05/11/2022 0954   MCH 30.3 08/04/2022 0331   MCHC 34.1 08/04/2022 0331   RDW 13.6 08/04/2022 0331   RDW 13.2 05/11/2022 0954   Iron Studies No results found for: "IRON", "TIBC", "FERRITIN", "IRONPCTSAT" Lipid Panel     Component Value Date/Time   CHOL 123 06/08/2023 0941   TRIG 75 06/08/2023 0941   HDL 67 06/08/2023 0941   LDLCALC 41 06/08/2023 0941   Hepatic Function Panel     Component Value Date/Time   PROT 6.7 06/08/2023 0941   ALBUMIN 4.3 06/08/2023 0941   AST 19  06/08/2023 0941   AST 16 06/08/2022 1439   ALT 16 06/08/2023 0941   ALT 12 06/08/2022 1439   ALKPHOS 58 06/08/2023 0941   BILITOT 0.4 06/08/2023 0941   BILITOT 0.4 06/08/2022 1439   BILIDIR <0.1 (L) 07/30/2015 1338   IBILI NOT CALCULATED 07/30/2015 1338      Component Value Date/Time   TSH 2.150 05/11/2022 0954   Nutritional Lab Results  Component Value Date   VD25OH 100.0 06/08/2023   VD25OH 55.8 12/01/2022   VD25OH 69.3 05/11/2022     Assessment and Plan      DMII -Continue diet and exercise and increase Mounjaro to 5 mg, but take it every 10 days instead of every 7 days. -Follow up in 3-4 weeks to assess progress.  EEB -Refill lexapro and continue to be mindful of slipping back into emotional eating habits    Obesity -Follow Category 2 plan when feeling better -goal to  increase exercise to 150 minutes per week -increase protean intake, protein shakes are an option until she starts feeling better.  Follow up in 3-4 weeks.     She was informed of the importance of frequent follow up visits to maximize her success with intensive lifestyle modifications for her multiple health conditions.    [name], MD

## 2023-09-05 ENCOUNTER — Encounter: Payer: Self-pay | Admitting: Sports Medicine

## 2023-09-05 ENCOUNTER — Ambulatory Visit: Payer: PPO | Admitting: Sports Medicine

## 2023-09-05 DIAGNOSIS — M1612 Unilateral primary osteoarthritis, left hip: Secondary | ICD-10-CM | POA: Diagnosis not present

## 2023-09-05 DIAGNOSIS — Z96641 Presence of right artificial hip joint: Secondary | ICD-10-CM | POA: Diagnosis not present

## 2023-09-05 DIAGNOSIS — M7918 Myalgia, other site: Secondary | ICD-10-CM

## 2023-09-05 DIAGNOSIS — S76012S Strain of muscle, fascia and tendon of left hip, sequela: Secondary | ICD-10-CM

## 2023-09-05 NOTE — Progress Notes (Signed)
Patient says that her pain in the left hip/buttock is not as bad as it was last week, although she is still having significant pain and aching. She says that she is also having increased soreness in the right hip. She says that she uses her cane when she leaves the house because of pain and feeling instable. She says that she does not use the cane in the house but does use counters and tables for support around the house. She says that movements like getting into and out of the car are very painful.

## 2023-09-05 NOTE — Progress Notes (Signed)
[name] - 77 y.o. female MRN [id number]  Date of birth: [birth year]  Office Visit Note: Visit Date: 09/05/2023 PCP: Daisy Floro, MD Referred by: Daisy Floro, MD  Subjective: Chief Complaint  Patient presents with   Left Hip - Follow-up, Pain   Right Hip - Follow-up, Pain   HPI: Lauren [name] is a pleasant 77 y.o. female who presents today for follow-up of left hip pain as well as posterior buttock pain.  Last week we did proceed with piriformis injection as well as a 6-day Medrol Dosepak.  She did stay that she received a fair amount of relief after the injection but her pain certainly never went away.  She has to a lesser degree pain in the groin, but her pain is rather severe and deep within the posterior aspect of the hip.  It is worse with certain motions.  She has been relying on her cane to walk and has an unsteady/painful gait.  Been in and out of the car and moving the hip joint is quite painful for her.  Last month she did undergo intra-articular hip injection which gave her good relief, more so in the groin.  Her pain still feels deep in the hip but in the posterior aspect.  No numbness or tingling going down the leg.  Pertinent ROS were reviewed with the patient and found to be negative unless otherwise specified above in HPI.   Assessment & Plan: Visit Diagnoses:  1. Unilateral primary osteoarthritis, left hip   2. Left buttock pain   3. Tear of left gluteus minimus tendon, sequela   4. History of right hip replacement    Plan: Discussed with [name] I believe she is dealing with an exacerbation of her severe left hip osteoarthritis.  She did get some relief from piriformis injection although her pain is still present.  I feel this was likely temporary relief because her whole hip musculature was flared up given her OA flare. When she moves her hip abductors and gluteal musculature actively this does not really bother her, but internal logroll  and FADIR testing significantly causes her pain, which I feel is emanating from the intra-articular hip.  At this point, I do think she would likely be a candidate to have her hip replaced sooner than later.  She has been ambulating with a cane and her gait is antalgic which is not normal for her.  She follows with Dr. Magnus Ivan as he did her contralateral hip and knee.  I would like her to see pain to see if his opinion and discuss possible hip replacement and what that would entail. She will continue on her chronic hydrocodone/acetaminophen 10-325 mg 3 times daily.   Follow-up: Return for make appt with Dr. Magnus Ivan.   Meds & Orders: No orders of the defined types were placed in this encounter.  No orders of the defined types were placed in this encounter.    Procedures: No procedures performed      Clinical History: No specialty comments available.  She reports that she quit smoking about 40 years ago. Her smoking use included cigarettes. She started smoking about 50 years ago. She has a 10 pack-year smoking history. She has never used smokeless tobacco.  Recent Labs    03/23/23 1012 06/08/23 0941  HGBA1C 6.5* 6.0*    Objective:    Physical Exam  Gen: Well-appearing, in no acute distress; non-toxic CV:  Well-perfused. Warm.  Resp: Breathing unlabored on room  air; no wheezing. Psych: Fluid speech in conversation; appropriate affect; normal thought process Neuro: Sensation intact throughout. No gross coordination deficits.   Ortho Exam - Left hip:  Narrative & Impression  CLINICAL DATA:  Right hip replacement. Chronic left hip pain. Right hip pain for 4 months.   EXAM: MR OF THE RIGHT HIP WITHOUT CONTRAST   MR OF THE LEFT HIP WITHOUT CONTRAST   TECHNIQUE: Multiplanar, multisequence MR imaging of the right hip was performed. No intravenous contrast was administered.   Multiplanar, multisequence MR imaging of the left hip was performed. No intravenous contrast was  administered.   COMPARISON:  None Available.   FINDINGS: Bones:   Right total hip arthroplasty with susceptibility artifact partially obscuring the adjacent soft tissue and osseous structures. No periarticular fluid collection or osteolysis. No acute fracture or dislocation.   No left hip fracture, dislocation or avascular necrosis.   No periosteal reaction or bone destruction. No aggressive osseous lesion.   Normal sacrum and sacroiliac joints. No SI joint widening or erosive changes.   Articular cartilage and labrum   Articular cartilage: High-grade partial-thickness cartilage loss with areas of full-thickness cartilage loss of the left femoral head and acetabulum and subchondral reactive marrow edema in the left femoral head.   Labrum:  Right anterior labral tear.   Joint or bursal effusion   Joint effusion: Moderate left hip joint effusion. No SI joint effusion.   Bursae:  No bursal fluid.   Muscles and tendons   Flexors: Normal.   Extensors: Normal.   Abductors: Normal.   Adductors: Normal.   Gluteals: Atrophy of the left gluteus minimus muscle. Chronic tear of the left gluteus minimus tendon insertion.   Hamstrings: Small partial-thickness tear of the hamstring origin bilaterally.   Other findings   No pelvic free fluid. No fluid collection or hematoma. No inguinal lymphadenopathy. No inguinal hernia.   IMPRESSION: 1. Right total hip arthroplasty without periarticular fluid collection or osteolysis. No hardware failure or complication. 2. Severe osteoarthritis of the left hip. Moderate left hip joint effusion. 3. Small partial-thickness tear of the hamstring origin bilaterally.     Electronically Signed   By: Elige Ko M.D.   On: 05/06/2023 10:55    Imaging: No results found.  Past Medical/Family/Surgical/Social History: Medications & Allergies reviewed per EMR, new medications updated. Patient Active Problem List   Diagnosis Date  Noted   Other hyperlipidemia 06/08/2023   Unilateral primary osteoarthritis, left hip 05/11/2023   Depression 03/16/2023   Allergic rhinitis 12/29/2022   Seasonal affective disorder (HCC) 12/01/2022   BMI 34.0-34.9,adult 12/01/2022   Obesity, Beginning BMI 37.59 12/01/2022   OA (osteoarthritis) of knee 08/03/2022   Status post total right knee replacement 08/03/2022   Stress 07/19/2022   Other constipation 07/19/2022   Atypical chest pain 06/25/2021   Class 2 severe obesity with serious comorbidity and body mass index (BMI) of 37.0 to 37.9 in adult (HCC) 06/03/2021   Vitamin D deficiency 06/03/2021   Other fatigue 04/22/2021   SOB (shortness of breath) on exertion 04/22/2021   Lower extremity edema 03/05/2021   Coronary artery calcification 02/20/2020   Pure hypercholesterolemia 02/20/2020   Unilateral primary osteoarthritis, right knee 01/03/2019   Genetic testing 12/07/2018   Family history of breast cancer    Family history of prostate cancer    Family history of colon cancer    Family history of kidney cancer    Malignant neoplasm of upper-outer quadrant of right breast in female, estrogen receptor  positive (HCC) 11/09/2018   Diabetic neuropathy, type II diabetes mellitus (HCC) 11/09/2018   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/02/2017   Trochanteric bursitis, right hip 05/10/2017   Status post total replacement of right hip 11/26/2016   DDD (degenerative disc disease), 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   Low back pain 04/23/2015   Hiatal hernia 04/13/2013   Diabetes mellitus (HCC) 01/26/2013   Umbilical hernia 07/24/2012   Essential hypertension    Past Medical History:  Diagnosis Date   Anxiety    Arthritis    Back, Hip- right   Atypical chest pain 06/25/2021   Back pain    Cancer (HCC)    right breast   Cataract    Colitis    Coronary artery calcification 02/20/2020   Diabetes mellitus without complication (HCC)    Type II   Edema 03/05/2021   Edema of both lower  extremities    Endometriosis    Family history of adverse reaction to anesthesia    Father - N/V - blood pressure; daughter N/V   Family history of breast cancer    Family history of colon cancer    Family history of kidney cancer    Family history of prostate cancer    Fatty liver    GERD (gastroesophageal reflux disease)    History of hiatal hernia    History of kidney stones    History of ulcerative colitis    Hypertension    Insomnia    Joint pain    Kidney problem    Lower extremity edema 03/05/2021   Lumbar disc disease    L4 L5   Neuropathy    Osteoarthritis    Other hyperlipidemia    Palpitations    Personal history of radiation therapy    Pneumonia    1983, 2003   Pure hypercholesterolemia 02/20/2020   PVCs (premature ventricular contractions)    benign   Umbilical hernia    Vitamin D deficiency    Wears glasses    Family History  Problem Relation Age of Onset   Hyperlipidemia Mother    Diabetes Mother    Hypertension Mother    Parkinson's disease Mother    Kidney disease Mother    Kidney disease Father    Stroke Father    Heart failure Father    Hypertension Father    Heart disease Father    Diabetes Father    Cancer Father        colon, prostate, and kidney   Heart disease Maternal Grandmother    Diabetes Maternal Grandmother    Heart disease Maternal Grandfather    Diabetes Maternal Grandfather    Cancer Maternal Grandfather        pt unaware of what kind   Stroke Paternal Grandmother    Stroke Paternal Grandfather    Cancer Maternal Aunt        cervical vs ovarian   Colon cancer Paternal Aunt        dx less than 50   Breast cancer Paternal Aunt        dx in her 8s   Prostate cancer Paternal Uncle    Breast cancer Cousin        pat first cousin, dx over 33   Prostate cancer Other        PGF's father   Past Surgical History:  Procedure Laterality Date   APPENDECTOMY     BREAST BIOPSY  08-13-2009  DR TSUEI   EXCISION LEFT NIPPLE DUCT  BREAST EXCISIONAL BIOPSY Left    x2   BREAST EXCISIONAL BIOPSY Right    BREAST LUMPECTOMY Right 11/16/2018   invasive ductal   CHOLECYSTECTOMY  1992   COLON RESECTION  1986   ULCERATIVE COLITIS   COLON SURGERY     ESOPHAGEAL MANOMETRY N/A 06/11/2013   Procedure: ESOPHAGEAL MANOMETRY (EM);  Surgeon: Petra Kuba, MD;  Location: [facility];  Service: Endoscopy;  Laterality: N/A;   ESOPHAGOGASTRODUODENOSCOPY (EGD) WITH PROPOFOL N/A 05/31/2016   Procedure: ESOPHAGOGASTRODUODENOSCOPY (EGD) WITH PROPOFOL;  Surgeon: Vida Rigger, MD;  Location: [facility] ENDOSCOPY;  Service: Endoscopy;  Laterality: N/A;   EXCISION RIGHT BREAST MASS   10-20-2005  DR Theron Arista YOUNG   EYE SURGERY Bilateral 2023   cataract   FRACTURE SURGERY     left arm   kidney stone removed  12/2009   KNEE ARTHROSCOPY Left    [facility]   LEFT THUMB CARPOMETACARPAL JOINT SUSPENSIONPLASTY  04-06-2006  DR Mina Marble   LEFT WRIST ARTHROSCOPY W/ DEBRIDEMENT AND REMOVAL CYST  03-26-2004  DR Mina Marble   MYOMECTOMY     RADIOACTIVE SEED GUIDED PARTIAL MASTECTOMY WITH AXILLARY SENTINEL LYMPH NODE BIOPSY Right 11/16/2018   Procedure: RIGHT BREAST RADIOACTIVE SEED GUIDED PARTIAL MASTECTOMY WITH  SENTINEL  NODE BIOPSY;  Surgeon: Abigail Miyamoto, MD;  Location: [facility] OR;  Service: General;  Laterality: Right;   RIGHT COLECTOMY     RIGHT URETEROSCOPIC STONE EXTRACTION  12-11-2009  DR Jonny Ruiz [facility]   TENDON RELEASE     TONSILLECTOMY     TOTAL HIP ARTHROPLASTY Right 11/26/2016   Procedure: RIGHT TOTAL HIP ARTHROPLASTY ANTERIOR APPROACH;  Surgeon: Kathryne Hitch, MD;  Location: [facility] ORS;  Service: Orthopedics;  Laterality: Right;   TOTAL KNEE ARTHROPLASTY Left 12/02/2017   Procedure: LEFT TOTAL KNEE ARTHROPLASTY;  Surgeon: Kathryne Hitch, MD;  Location: [facility] ORS;  Service: Orthopedics;  Laterality: Left;   TOTAL KNEE ARTHROPLASTY Right 08/03/2022   Procedure: RIGHT TOTAL KNEE ARTHROPLASTY;  Surgeon: Kathryne Hitch, MD;  Location: [facility] OR;   Service: Orthopedics;  Laterality: Right;   WRIST SURGERY     both   Social History   Occupational History   Occupation: Retired  Tobacco Use   Smoking status: Former    Current packs/day: 0.00    Average packs/day: 1 pack/day for 10.0 years (10.0 ttl pk-yrs)    Types: Cigarettes    Start date: 09/22/1972    Quit date: 09/22/1982    Years since quitting: 40.9   Smokeless tobacco: Never   Tobacco comments:    quit 1983  Vaping Use   Vaping status: Never Used  Substance and Sexual Activity   Alcohol use: No   Drug use: No   Sexual activity: Yes    Partners: Male    Birth control/protection: Post-menopausal

## 2023-09-21 ENCOUNTER — Encounter: Payer: Self-pay | Admitting: Sports Medicine

## 2023-09-21 ENCOUNTER — Ambulatory Visit: Payer: PPO | Admitting: Orthopaedic Surgery

## 2023-09-21 ENCOUNTER — Telehealth: Payer: Self-pay | Admitting: *Deleted

## 2023-09-21 ENCOUNTER — Encounter: Payer: Self-pay | Admitting: Orthopaedic Surgery

## 2023-09-21 DIAGNOSIS — M25551 Pain in right hip: Secondary | ICD-10-CM | POA: Diagnosis not present

## 2023-09-21 DIAGNOSIS — M1612 Unilateral primary osteoarthritis, left hip: Secondary | ICD-10-CM

## 2023-09-21 DIAGNOSIS — M25552 Pain in left hip: Secondary | ICD-10-CM

## 2023-09-21 DIAGNOSIS — M47817 Spondylosis without myelopathy or radiculopathy, lumbosacral region: Secondary | ICD-10-CM | POA: Diagnosis not present

## 2023-09-21 DIAGNOSIS — M150 Primary generalized (osteo)arthritis: Secondary | ICD-10-CM | POA: Diagnosis not present

## 2023-09-21 NOTE — Progress Notes (Signed)
The patient is a 77 year old female well-known to [place].  She has had both her knees replaced and her right hip replaced.  She does ambulate with a cane.  She has known and well-documented severe end-stage arthritis in her left hip.  At this point she does wish to proceed with hip replacement surgery.  She has had a long course of nonoperative conservative treatment.  She has seen my partner Dr. Shon Baton who has worked a lot on her right hip with the psoas tendon as well as the IT band.  She has had physical therapy as well.  It is still frustrating to her that she is still has right hip groin pain.  A MRI of the right hip showed no complicating features of the implants or the soft tissue around the hip.  Her left hip MRI shows areas of full-thickness cartilage loss.  She has responded for short courses of injections in the left hip which helped for only a short amount of time.  At this point her left hip pain is daily and is debilitating.  She still has some right hip pain.  She has pain that is 10 out of 10.  It is detrimentally affecting her mobility, her quality of life and her actives daily living.  She is here today again to talk about hip replacement surgery for her left hip.  She does ambulate using a cane as well.  I was able to review all of her medications and past medical history within epic and again she is well-known to our clinic.  On exam both hips have pain throughout the arc of motion of her rotation.  The left hip hurts quite significantly in the groin and on the lateral aspect of the hip.  The right hip hurts and just the groin but has no blocks to rotation and moves smoothly.  This point I agree with proceeding with a left total hip arthroplasty.  My hope is that this will help with her balance and coordination and will take pressure off of her right side as well as her back and can help in the long run.  She is nervous about this which I believe is absolutely appropriate.  Her daughter is with  her today and we collectively agreed that the neck step is proceeding with a left total hip arthroplasty.  Having had this before she is fully aware of the risks and benefits of surgery and what to expect from an intraoperative and postoperative course.  All questions and concerns were answered and addressed.  Will work on getting this scheduled.

## 2023-09-21 NOTE — Telephone Encounter (Signed)
Ortho bundle 1 year call completed for R-TKR.

## 2023-09-22 ENCOUNTER — Encounter (INDEPENDENT_AMBULATORY_CARE_PROVIDER_SITE_OTHER): Payer: Self-pay | Admitting: Family Medicine

## 2023-09-22 ENCOUNTER — Ambulatory Visit (INDEPENDENT_AMBULATORY_CARE_PROVIDER_SITE_OTHER): Payer: PPO | Admitting: Family Medicine

## 2023-09-22 VITALS — BP 106/57 | HR 74 | Temp 98.4°F | Ht 64.0 in | Wt 194.0 lb

## 2023-09-22 DIAGNOSIS — E119 Type 2 diabetes mellitus without complications: Secondary | ICD-10-CM | POA: Diagnosis not present

## 2023-09-22 DIAGNOSIS — M1612 Unilateral primary osteoarthritis, left hip: Secondary | ICD-10-CM

## 2023-09-22 DIAGNOSIS — E559 Vitamin D deficiency, unspecified: Secondary | ICD-10-CM

## 2023-09-22 DIAGNOSIS — E669 Obesity, unspecified: Secondary | ICD-10-CM | POA: Diagnosis not present

## 2023-09-22 DIAGNOSIS — G2581 Restless legs syndrome: Secondary | ICD-10-CM | POA: Insufficient documentation

## 2023-09-22 DIAGNOSIS — E1140 Type 2 diabetes mellitus with diabetic neuropathy, unspecified: Secondary | ICD-10-CM

## 2023-09-22 DIAGNOSIS — Z7985 Long-term (current) use of injectable non-insulin antidiabetic drugs: Secondary | ICD-10-CM | POA: Diagnosis not present

## 2023-09-22 DIAGNOSIS — Z6833 Body mass index (BMI) 33.0-33.9, adult: Secondary | ICD-10-CM | POA: Diagnosis not present

## 2023-09-22 MED ORDER — TIRZEPATIDE 5 MG/0.5ML ~~LOC~~ SOAJ
5.0000 mg | SUBCUTANEOUS | 0 refills | Status: DC
Start: 2023-09-22 — End: 2023-11-22

## 2023-09-22 MED ORDER — VITAMIN D (ERGOCALCIFEROL) 1.25 MG (50000 UNIT) PO CAPS: 50000.0000 [IU] | ORAL_CAPSULE | ORAL | 0 refills | Status: DC

## 2023-09-22 NOTE — Progress Notes (Signed)
.[facility]  Office: [phone]  /  Fax: [phone]  WEIGHT SUMMARY AND BIOMETRICS  Anthropometric Measurements Height: 5\' 4"  (1.626 m) Weight: 194 lb (88 kg) BMI (Calculated): 33.28 Weight at Last Visit: 195 lb Weight Lost Since Last Visit: 8 lb Weight Gained Since Last Visit: 0 Starting Weight: 219 lb Total Weight Loss (lbs): 25 lb (11.3 kg)   Body Composition  Body Fat %: 49.2 % Fat Mass (lbs): 95.6 lbs Muscle Mass (lbs): 93.8 lbs Total Body Water (lbs): 73.6 lbs Visceral Fat Rating : 15   Other Clinical Data Fasting: no Labs: no Today's Visit #: 31 Starting Date: 04/22/21    Chief Complaint: OBESITY   History of Present Illness   The patient, with a history of obesity, diabetes, and vitamin D deficiency, presents with worsening restless leg syndrome (RLS). She reports an inability to keep her feet still, particularly at night, leading to disrupted sleep and feelings of exhaustion upon waking. The patient has attempted to alleviate symptoms by sleeping with a pillow between her knees, but this has provided minimal relief. The RLS has become increasingly bothersome, affecting her overall well-being and quality of life.  In terms of her obesity, the patient has lost a pound over the last two months and is adhering to her category two eating plan approximately 60% of the time. However, she is currently not engaging in any form of exercise due to a pending hip replacement surgery scheduled for early next year. The patient has been experiencing significant hip pain, which has further limited her mobility and ability to exercise.  The patient also reports struggling with protein intake, managing to incorporate good protein into at least one meal a day. She is making efforts to maintain a balanced diet, including microwave meals with adequate protein content, salads, unsweetened applesauce, and jello with fruits. Despite these efforts, the patient acknowledges that her protein  intake is still insufficient.  The patient's diabetes appears to be well-controlled, with blood sugar levels typically in the 80s to low 90s. The only noted spike in blood sugar was associated with a course of prednisone following a cortisone shot, during which her blood sugar level reached 100 only once. The patient is also on a regimen of vitamin D supplementation, currently taken every two weeks.          PHYSICAL EXAM:  Blood pressure (!) 106/57, pulse 74, temperature 98.4 F (36.9 C), height 5\' 4"  (1.626 m), weight 194 lb (88 kg), SpO2 95%. Body mass index is 33.3 kg/m.  DIAGNOSTIC DATA REVIEWED:  BMET    Component Value Date/Time   NA 132 (L) 06/08/2023 0941   K 3.7 06/08/2023 0941   CL 91 (L) 06/08/2023 0941   CO2 24 06/08/2023 0941   GLUCOSE 89 06/08/2023 0941   GLUCOSE 127 (H) 08/04/2022 0331   BUN 14 06/08/2023 0941   CREATININE 0.91 06/08/2023 0941   CREATININE 0.87 06/08/2022 1439   CALCIUM 9.5 06/08/2023 0941   GFRNONAA 52 (L) 08/04/2022 0331   GFRNONAA >60 06/08/2022 1439   GFRAA >60 02/04/2020 1212   GFRAA >60 11/09/2018 1405   Lab Results  Component Value Date   HGBA1C 6.0 (H) 06/08/2023   HGBA1C 6.7 (H) 11/16/2016   Lab Results  Component Value Date   INSULIN 25.6 (H) 06/08/2023   INSULIN 22.3 04/22/2021   Lab Results  Component Value Date   TSH 2.150 05/11/2022   CBC    Component Value Date/Time   WBC 7.8 08/04/2022 0331  RBC 3.14 (L) 08/04/2022 0331   HGB 9.5 (L) 08/04/2022 0331   HGB 12.3 06/08/2022 1439   HGB 11.8 05/11/2022 0954   HCT 27.9 (L) 08/04/2022 0331   HCT 35.1 05/11/2022 0954   PLT 205 08/04/2022 0331   PLT 272 06/08/2022 1439   PLT 265 05/11/2022 0954   MCV 88.9 08/04/2022 0331   MCV 87 05/11/2022 0954   MCH 30.3 08/04/2022 0331   MCHC 34.1 08/04/2022 0331   RDW 13.6 08/04/2022 0331   RDW 13.2 05/11/2022 0954   Iron Studies No results found for: "IRON", "TIBC", "FERRITIN", "IRONPCTSAT" Lipid Panel     Component  Value Date/Time   CHOL 123 06/08/2023 0941   TRIG 75 06/08/2023 0941   HDL 67 06/08/2023 0941   LDLCALC 41 06/08/2023 0941   Hepatic Function Panel     Component Value Date/Time   PROT 6.7 06/08/2023 0941   ALBUMIN 4.3 06/08/2023 0941   AST 19 06/08/2023 0941   AST 16 06/08/2022 1439   ALT 16 06/08/2023 0941   ALT 12 06/08/2022 1439   ALKPHOS 58 06/08/2023 0941   BILITOT 0.4 06/08/2023 0941   BILITOT 0.4 06/08/2022 1439   BILIDIR <0.1 (L) 07/30/2015 1338   IBILI NOT CALCULATED 07/30/2015 1338      Component Value Date/Time   TSH 2.150 05/11/2022 0954   Nutritional Lab Results  Component Value Date   VD25OH 100.0 06/08/2023   VD25OH 55.8 12/01/2022   VD25OH 69.3 05/11/2022     Assessment and Plan    Hip Osteoarthritis Severe hip osteoarthritis requiring hip replacement surgery scheduled for early next year. Discussed the potential benefits of shockwave treatments for pre-surgery leg strengthening. Patient prefers to follow the surgeon's recommendation for surgery. Discussed the risks and benefits of hip replacement surgery, including improved mobility and pain relief versus surgical risks and recovery time. - Proceed with hip replacement surgery as scheduled - Consider shockwave treatments for leg strength if time permits  Obesity and DM II Obesity with minimal weight loss (1 pound) over the last two months. Following category two eating plan 60% of the time. No current exercise due to hip pain and upcoming hip replacement surgery. Discussed the importance of balanced nutrition and gradual weight loss to avoid muscle loss and metabolic slowdown. Encouraged protein intake through meals and snacks. Discussed the risks of rapid weight loss and malnutrition, including muscle loss and metabolic slowdown. - Continue category two eating plan - Increase protein intake through meals and snacks - Encourage light physical activity as tolerated  Restless Leg Syndrome  (RLS) Worsening restless leg syndrome, affecting sleep quality. No prior workup for RLS. Discussed potential causes including low iron levels and the need for a comprehensive workup by a neurologist. Discussed the potential need for a sleep study to evaluate sleep quality and diagnose any underlying conditions. - Refer to neurologist for RLS workup - Consider sleep study if recommended by neurologist  Vitamin D Deficiency Vitamin D deficiency with a previous level of 100. Currently taking vitamin D 5000 IU daily. Plan to recheck levels to adjust supplementation as needed. Discussed the risks of high vitamin D levels, including potential nerve issues if levels exceed 100. - Order vitamin D level - If vitamin D level is below 80, continue current supplementation - If vitamin D level is above 80, stop vitamin D supplementation and recheck in January or February  General Health Maintenance Discussed the importance of maintaining balanced nutrition, especially protein intake, and the use of  protein-rich snacks. Encouraged routine and planning for meals and snacks. - Encourage balanced nutrition with adequate protein intake - Use protein-rich snacks like string cheese, baby bell cheese, and protein bars - Plan meals and snacks to ensure consistent protein intake  Follow-up - Follow-up appointment on December 23 - Adjust vitamin D supplementation based on lab results.       She was informed of the importance of frequent follow up visits to maximize her success with intensive lifestyle modifications for her multiple health conditions.    [name], MD

## 2023-09-23 LAB — VITAMIN D 25 HYDROXY (VIT D DEFICIENCY, FRACTURES): Vit D, 25-Hydroxy: 124 ng/mL — ABNORMAL HIGH (ref 30.0–100.0)

## 2023-10-03 DIAGNOSIS — E559 Vitamin D deficiency, unspecified: Secondary | ICD-10-CM | POA: Diagnosis not present

## 2023-10-03 DIAGNOSIS — E7800 Pure hypercholesterolemia, unspecified: Secondary | ICD-10-CM | POA: Diagnosis not present

## 2023-10-03 DIAGNOSIS — I10 Essential (primary) hypertension: Secondary | ICD-10-CM | POA: Diagnosis not present

## 2023-10-03 DIAGNOSIS — E1169 Type 2 diabetes mellitus with other specified complication: Secondary | ICD-10-CM | POA: Diagnosis not present

## 2023-10-04 ENCOUNTER — Ambulatory Visit: Payer: PPO | Admitting: Sports Medicine

## 2023-10-04 ENCOUNTER — Encounter: Payer: Self-pay | Admitting: Sports Medicine

## 2023-10-04 DIAGNOSIS — M1612 Unilateral primary osteoarthritis, left hip: Secondary | ICD-10-CM | POA: Diagnosis not present

## 2023-10-04 DIAGNOSIS — M7071 Other bursitis of hip, right hip: Secondary | ICD-10-CM

## 2023-10-04 DIAGNOSIS — Z96641 Presence of right artificial hip joint: Secondary | ICD-10-CM

## 2023-10-04 DIAGNOSIS — M25551 Pain in right hip: Secondary | ICD-10-CM

## 2023-10-04 NOTE — Progress Notes (Signed)
Lauren [name] - 77 y.o. female MRN [id number]  Date of birth: [birth year]  Office Visit Note: Visit Date: 10/04/2023 PCP: Daisy Floro, MD Referred by: Daisy Floro, MD  Subjective: Chief Complaint  Patient presents with   Right Hip - Pain   HPI: Lauren [name] is a pleasant 77 y.o. female who presents today for R > L hip pain.  [name] has a history of right hip replacement performed by Dr. Magnus Ivan years ago.  She is planning to have the left hip replaced as well.  She has had some issues over the iliopsoas as well as the lateral hip tendons which have given her some pain.  She has responded well to extracorporeal shockwave therapy months ago.  She is interested in repeating this again.  Her left hip continues to be rather stiff and altering her gait.  She is proceeding with planned hip replacement with Dr. Magnus Ivan, however he does not have a date set yet.  She did have some issues getting into her car the other week.  Pertinent ROS were reviewed with the patient and found to be negative unless otherwise specified above in HPI.   Assessment & Plan: Visit Diagnoses:  1. Pain in right hip   2. Iliopsoas bursitis of right hip   3. History of right hip replacement   4. Unilateral primary osteoarthritis, left hip    Plan: [name] is dealing with right anterior hip pain that is acute on chronic.  Her pain seems to be emanating from the iliopsoas and her anterior hip flexors.  We discussed this is likely compensatory from her left hip severe osteoarthritis which has affected her gait.  She is planning on setting up left hip total arthroplasty with Dr. Magnus Ivan in the coming weeks to months.  She has responded well in the past with extracorporeal shockwave therapy for the anterior hip flexors, we did proceed with this treatment today which patient tolerated well.  We also discussed getting her into formalized physical therapy to work on her right anterior hip flexors as well  as her gait and prehab for her left hip arthritis with planned replacement.  Referral was sent today for PT. She will continue on her chronic hydrocodone/acetaminophen 10-325 mg 3 times daily.  We will proceed with a few treatment sessions of extracorporeal shockwave therapy as long as she is finding benefit between 1-2 weeks intervals.   Follow-up: Return for f/u in 1-2 weeks .   Meds & Orders: No orders of the defined types were placed in this encounter.  No orders of the defined types were placed in this encounter.    Procedures: Procedure: ECSWT Indications:  IT-band syndrome, GT syndrome, and iliopsoas tendinopathy/bursitis   Procedure Details Consent: Risks of procedure as well as the alternatives and risks of each were explained to the patient.  Verbal consent for procedure obtained. Time Out: Verified patient identification, verified procedure, site was marked, verified correct patient position. The area was cleaned with alcohol swab.      The right iliopsoas and anterior hip flexors was targeted for Extracorporeal shockwave therapy.    Preset: Muscle Injury Power Level: 120 mJ Frequency: 14 Hz Impulse/cycles: 3000 Head size: Regular   Patient tolerated procedure well without immediate complications.        Clinical History: No specialty comments available.  She reports that she quit smoking about 41 years ago. Her smoking use included cigarettes. She started smoking about 51 years ago. She has a 10  pack-year smoking history. She has never used smokeless tobacco.  Recent Labs    03/23/23 1012 06/08/23 0941  HGBA1C 6.5* 6.0*    Objective:    Physical Exam  Gen: Well-appearing, in no acute distress; non-toxic CV: Well-perfused. Warm.  Resp: Breathing unlabored on room air; no wheezing. Psych: Fluid speech in conversation; appropriate affect; normal thought process Neuro: Sensation intact throughout. No gross coordination deficits.   Ortho Exam - Right hip: + TTP  in the inguinal crease.  There is pain with resisted hip flexion.  No pain with internal or external logroll.  No redness or swelling in this location.  Imaging:  Narrative & Impression  CLINICAL DATA:  Right hip replacement. Chronic left hip pain. Right hip pain for 4 months.   EXAM: MR OF THE RIGHT HIP WITHOUT CONTRAST   MR OF THE LEFT HIP WITHOUT CONTRAST   TECHNIQUE: Multiplanar, multisequence MR imaging of the right hip was performed. No intravenous contrast was administered.   Multiplanar, multisequence MR imaging of the left hip was performed. No intravenous contrast was administered.   COMPARISON:  None Available.   FINDINGS: Bones:   Right total hip arthroplasty with susceptibility artifact partially obscuring the adjacent soft tissue and osseous structures. No periarticular fluid collection or osteolysis. No acute fracture or dislocation.   No left hip fracture, dislocation or avascular necrosis.   No periosteal reaction or bone destruction. No aggressive osseous lesion.   Normal sacrum and sacroiliac joints. No SI joint widening or erosive changes.   Articular cartilage and labrum   Articular cartilage: High-grade partial-thickness cartilage loss with areas of full-thickness cartilage loss of the left femoral head and acetabulum and subchondral reactive marrow edema in the left femoral head.   Labrum:  Right anterior labral tear.   Joint or bursal effusion   Joint effusion: Moderate left hip joint effusion. No SI joint effusion.   Bursae:  No bursal fluid.   Muscles and tendons   Flexors: Normal.   Extensors: Normal.   Abductors: Normal.   Adductors: Normal.   Gluteals: Atrophy of the left gluteus minimus muscle. Chronic tear of the left gluteus minimus tendon insertion.   Hamstrings: Small partial-thickness tear of the hamstring origin bilaterally.   Other findings   No pelvic free fluid. No fluid collection or hematoma. No  inguinal lymphadenopathy. No inguinal hernia.   IMPRESSION: 1. Right total hip arthroplasty without periarticular fluid collection or osteolysis. No hardware failure or complication. 2. Severe osteoarthritis of the left hip. Moderate left hip joint effusion. 3. Small partial-thickness tear of the hamstring origin bilaterally.     Electronically Signed   By: Elige Ko M.D.   On: 05/06/2023 10:55   Past Medical/Family/Surgical/Social History: Medications & Allergies reviewed per EMR, new medications updated. 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/03/2022   Stress 07/19/2022   Other constipation 07/19/2022   Atypical chest pain 06/25/2021   Class 2 severe obesity with serious comorbidity and body mass index (BMI) of 37.0 to 37.9 in adult [facility]) 06/03/2021   Vitamin D deficiency 06/03/2021   Other fatigue 04/22/2021   SOB (shortness  of breath) on exertion 04/22/2021   Lower extremity edema 03/05/2021   Coronary artery calcification 02/20/2020   Pure hypercholesterolemia 02/20/2020   Unilateral primary osteoarthritis, right knee 01/03/2019   Genetic testing 12/07/2018   Family history of breast cancer    Family history of prostate cancer    Family history of colon cancer    Family history of kidney cancer    Malignant neoplasm of upper-outer quadrant of right breast in female, 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/09/2018   Diabetic neuropathy, type II diabetes mellitus (HCC) 11/09/2018   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/02/2017   Trochanteric bursitis, right hip  05/10/2017   Status post total replacement of right hip 11/26/2016   DDD (degenerative disc disease), 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   Low back pain 04/23/2015   Hiatal hernia 04/13/2013   Diabetes mellitus (HCC) 01/26/2013   Umbilical hernia 07/24/2012   Essential hypertension    Past Medical History:  Diagnosis Date   Anxiety    Arthritis    Back, Hip- right   Atypical chest pain 06/25/2021   Back pain    Cancer (HCC)    right breast   Cataract    Colitis    Coronary artery calcification 02/20/2020   Diabetes mellitus without complication (HCC)    Type II   Edema 03/05/2021   Edema of both lower extremities    Endometriosis    Family history of adverse reaction to anesthesia    Father - N/V - blood pressure; daughter N/V   Family history of breast cancer    Family history of colon cancer    Family history of kidney cancer    Family history of prostate cancer    Fatty liver    GERD (gastroesophageal reflux disease)    History of hiatal hernia    History of kidney stones    History of ulcerative colitis    Hypertension    Insomnia    Joint pain    Kidney problem    Lower extremity edema 03/05/2021   Lumbar disc disease    L4 L5   Neuropathy    Osteoarthritis    Other hyperlipidemia    Palpitations    Personal history of radiation therapy    Pneumonia    1983, 2003   Pure hypercholesterolemia 02/20/2020   PVCs (premature ventricular contractions)    benign   Umbilical hernia    Vitamin D deficiency    Wears glasses    Family History  Problem Relation Age of Onset   Hyperlipidemia Mother    Diabetes Mother    Hypertension Mother    Parkinson's disease Mother    Kidney disease Mother    Kidney disease Father    Stroke Father    Heart failure Father    Hypertension Father    Heart disease Father    Diabetes Father    Cancer Father        colon, prostate, and kidney   Heart disease Maternal Grandmother    Diabetes Maternal Grandmother    Heart disease  Maternal Grandfather    Diabetes Maternal Grandfather    Cancer Maternal Grandfather        pt unaware of what kind   Stroke Paternal Grandmother    Stroke Paternal Grandfather    Cancer Maternal Aunt        cervical vs ovarian   Colon cancer Paternal Aunt        dx less than 50   Breast cancer Paternal  Aunt        dx in her 31s   Prostate cancer Paternal Uncle    Breast cancer Cousin        pat first cousin, dx over 72   Prostate cancer Other        PGF's father   Past Surgical History:  Procedure Laterality Date   APPENDECTOMY     BREAST BIOPSY  08-13-2009  DR TSUEI   EXCISION LEFT NIPPLE DUCT   BREAST EXCISIONAL BIOPSY Left    x2   BREAST EXCISIONAL BIOPSY Right    BREAST LUMPECTOMY Right 11/16/2018   invasive ductal   CHOLECYSTECTOMY  1992   COLON RESECTION  1986   ULCERATIVE COLITIS   COLON SURGERY     ESOPHAGEAL MANOMETRY N/A 06/11/2013   Procedure: ESOPHAGEAL MANOMETRY (EM);  Surgeon: Petra Kuba, MD;  Location: [facility];  Service: Endoscopy;  Laterality: N/A;   ESOPHAGOGASTRODUODENOSCOPY (EGD) WITH PROPOFOL N/A 05/31/2016   Procedure: ESOPHAGOGASTRODUODENOSCOPY (EGD) WITH PROPOFOL;  Surgeon: Vida Rigger, MD;  Location[facility] ENDOSCOPY;  Service: Endoscopy;  Laterality: N/A;   EXCISION RIGHT BREAST MASS   10-20-2005  DR Theron Arista YOUNG   EYE SURGERY Bilateral 2023   cataract   FRACTURE SURGERY     left arm   kidney stone removed  12/2009   KNEE ARTHROSCOPY Left    MCL   LEFT THUMB CARPOMETACARPAL JOINT SUSPENSIONPLASTY  04-06-2006  DR Mina Marble   LEFT WRIST ARTHROSCOPY W/ DEBRIDEMENT AND REMOVAL CYST  03-26-2004  DR Mina Marble   MYOMECTOMY     RADIOACTIVE SEED GUIDED PARTIAL MASTECTOMY WITH AXILLARY SENTINEL LYMPH NODE BIOPSY Right 11/16/2018   Procedure: RIGHT BREAST RADIOACTIVE SEED GUIDED PARTIAL MASTECTOMY WITH  SENTINEL  NODE BIOPSY;  Surgeon: Abigail Miyamoto, MD;  Location: [facility] OR;  Service: General;  Laterality: Right;   RIGHT COLECTOMY     RIGHT  URETEROSCOPIC STONE EXTRACTION  12-11-2009  DR Jonny Ruiz [facility]   TENDON RELEASE     TONSILLECTOMY     TOTAL HIP ARTHROPLASTY Right 11/26/2016   Procedure: RIGHT TOTAL HIP ARTHROPLASTY ANTERIOR APPROACH;  Surgeon: Kathryne Hitch, MD;  Location: [facility];  Service: Orthopedics;  Laterality: Right;   TOTAL KNEE ARTHROPLASTY Left 12/02/2017   Procedure: LEFT TOTAL KNEE ARTHROPLASTY;  Surgeon: Kathryne Hitch, MD;  Location: [facility] OR[facility];  Service: Orthopedics;  Laterality: Left;   TOTAL KNEE ARTHROPLASTY Right 08/03/2022   Procedure: RIGHT TOTAL KNEE ARTHROPLASTY;  Surgeon: Kathryne Hitch, MD;  Location: [facility] OR;  Service: Orthopedics;  Laterality: Right;   WRIST SURGERY     both   Social History   Occupational History   Occupation: Retired  Tobacco Use   Smoking status: Former    Current packs/day: 0.00    Average packs/day: 1 pack/day for 10.0 years (10.0 ttl pk-yrs)    Types: Cigarettes    Start date: 09/22/1972    Quit date: 09/22/1982    Years since quitting: 41.0   Smokeless tobacco: Never   Tobacco comments:    quit 1983  Vaping Use   Vaping status: Never Used  Substance and Sexual Activity   Alcohol use: No   Drug use: No   Sexual activity: Yes    Partners: Male    Birth control/protection: Post-menopausal

## 2023-10-04 NOTE — Addendum Note (Signed)
Addended by: York Spaniel on: 10/04/2023 09:47 AM   Modules accepted: Orders

## 2023-10-11 ENCOUNTER — Ambulatory Visit: Payer: PPO | Admitting: Sports Medicine

## 2023-10-11 ENCOUNTER — Encounter: Payer: Self-pay | Admitting: Sports Medicine

## 2023-10-11 DIAGNOSIS — M7071 Other bursitis of hip, right hip: Secondary | ICD-10-CM | POA: Diagnosis not present

## 2023-10-11 DIAGNOSIS — Z96641 Presence of right artificial hip joint: Secondary | ICD-10-CM | POA: Diagnosis not present

## 2023-10-11 DIAGNOSIS — M1612 Unilateral primary osteoarthritis, left hip: Secondary | ICD-10-CM

## 2023-10-11 DIAGNOSIS — M25551 Pain in right hip: Secondary | ICD-10-CM | POA: Diagnosis not present

## 2023-10-11 NOTE — Progress Notes (Signed)
Lauren [name] - 77 y.o. female MRN [id number]  Date of birth: [birth year]  Office Visit Note: Visit Date: 10/11/2023 PCP: Daisy Floro, MD Referred by: Daisy Floro, MD  Subjective: Chief Complaint  Patient presents with   Right Hip - Follow-up   HPI: Lauren [name] is a pleasant 77 y.o. female who presents today for follow-up of right anterior and lateral hip pain.   Lauren [name] has a history of right hip replacement performed by Dr. Magnus Ivan years ago. She is planning to have the left hip replaced as well.  She has contacted the surgery scheduler, looking at January for left hip replacement. .   After our last shockwave treatment for the right hip, she was feeling really good through a few days.  Over the last few weeks she has been having more pain and difficulty sleeping because of pain in both of her hips. She does have her first PT appointment this upcoming Thursday for the R-hip and pre-hab for the left hip with anticipated L-hip THA. Uses her chronic hydrocodone/acetaminophen 10-325 mg 3 times daily.   Pertinent ROS were reviewed with the patient and found to be negative unless otherwise specified above in HPI.   Assessment & Plan: Visit Diagnoses:  1. Pain in right hip   2. Greater trochanteric pain syndrome of right lower extremity   3. Iliopsoas bursitis of right hip   4. History of right hip replacement   5. Unilateral primary osteoarthritis, left hip    Plan: Lauren [name] received good temporary relief from our first treatment of extracorporeal shockwave therapy for her right hip both for the iliopsoas tendinopathy as well as her greater trochanteric pain syndrome.  We discussed this is likely compensatory from her gait abnormality as she is limping from her limited left hip severe osteoarthritis.  She is planning on having a date set sometime in January for left hip replacement.  We did perform ESWT both for the right anterior hip flexor/psoas as well as the  greater trochanteric region today.  She will move forward with her formalized physical therapy both for the right hip and the left hip strength to help improve her gait/hip stability and prepare her for her upcoming hip replacement.  Continue her chronic hydrocodone-acetaminophen 10-325 mg 3 times daily.  She will follow-up over the next 1-2 weeks for re-evaluation and repeat shockwave. As long as she is finding benefit we will plan for a few additional treatments of ESWT.  Follow-up: Return for f/u 1-2 weeks R-hip.   Meds & Orders: No orders of the defined types were placed in this encounter.  No orders of the defined types were placed in this encounter.    Procedures: Procedure: ECSWT Indications: GT syndrome, and iliopsoas tendinopathy/bursitis   Procedure Details Consent: Risks of procedure as well as the alternatives and risks of each were explained to the patient.  Verbal consent for procedure obtained. Time Out: Verified patient identification, verified procedure, site was marked, verified correct patient position. The area was cleaned with alcohol swab.      The right iliopsoas/anterior hip flexors was targeted for Extracorporeal shockwave therapy.    Preset: Muscle Injury Power Level: 120 mJ Frequency: 15 Hz Impulse/cycles: 3200 Head size: Regular  The right greater trochanteric region was targeted as well for Extracorporeal shockwave therapy   Power Level: 120 mJ Frequency: 15 Hz Impulse/cycles: 1200 Head size: Regular    Patient tolerated procedure well without immediate complications.       Clinical  History: No specialty comments available.  She reports that she quit smoking about 41 years ago. Her smoking use included cigarettes. She started smoking about 51 years ago. She has a 10 pack-year smoking history. She has never used smokeless tobacco.  Recent Labs    03/23/23 1012 06/08/23 0941  HGBA1C 6.5* 6.0*    Objective:    Physical Exam  Gen:  Well-appearing, in no acute distress; non-toxic CV: Well-perfused. Warm.  Resp: Breathing unlabored on room air; no wheezing. Psych: Fluid speech in conversation; appropriate affect; normal thought process Neuro: Sensation intact throughout. No gross coordination deficits.   Ortho Exam - Right hip: + TTP within the inguinal crease near the iliopsoas region, no redness, swelling or effusion here. + TTP over greater trochanter. + pain with hip flexion, + [place].  There is an antalgic gait with stance on the right side with hesitancy with swing through on the left hip.  Imaging: No results found.  Past Medical/Family/Surgical/Social History: Medications & Allergies reviewed per EMR, new medications updated. 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/03/2022   Stress 07/19/2022   Other constipation 07/19/2022   Atypical chest pain 06/25/2021   Class 2 severe obesity with serious comorbidity and body mass index (BMI) of 37.0 to 37.9 in adult (HCC) 06/03/2021   Vitamin D deficiency 06/03/2021   Other fatigue 04/22/2021   SOB (shortness of breath) on exertion 04/22/2021   Lower extremity edema 03/05/2021   Coronary artery calcification 02/20/2020   Pure hypercholesterolemia 02/20/2020   Unilateral primary osteoarthritis, right knee 01/03/2019   Genetic testing 12/07/2018   Family history of breast cancer    Family history of prostate cancer    Family history of colon cancer    Family history of kidney cancer    Malignant neoplasm of upper-outer quadrant of right breast in female,  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/09/2018   Diabetic neuropathy, type II diabetes mellitus (HCC) 11/09/2018   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/02/2017   Trochanteric bursitis, right hip 05/10/2017   Status post total replacement of right hip 11/26/2016   DDD (degenerative disc disease), 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   Low back pain 04/23/2015   Hiatal hernia 04/13/2013   Diabetes mellitus (HCC) 01/26/2013   Umbilical hernia 07/24/2012   Essential hypertension    Past Medical History:  Diagnosis Date   Anxiety    Arthritis    Back, Hip- right   Atypical chest pain 06/25/2021   Back pain    Cancer (HCC)    right breast   Cataract    Colitis    Coronary artery calcification 02/20/2020   Diabetes mellitus without complication (HCC)    Type II   Edema 03/05/2021   Edema of both lower extremities    Endometriosis    Family history of adverse reaction to anesthesia    Father - N/V - blood pressure; daughter N/V   Family history of breast cancer    Family history of colon cancer    Family history of kidney cancer    Family history of prostate cancer    Fatty liver    GERD (  gastroesophageal reflux disease)    History of hiatal hernia    History of kidney stones    History of ulcerative colitis    Hypertension    Insomnia    Joint pain    Kidney problem    Lower extremity edema 03/05/2021   Lumbar disc disease    L4 L5   Neuropathy    Osteoarthritis    Other hyperlipidemia    Palpitations    Personal history of radiation therapy    Pneumonia    1983, 2003   Pure hypercholesterolemia 02/20/2020   PVCs (premature ventricular contractions)    benign   Umbilical hernia    Vitamin D deficiency    Wears glasses    Family History  Problem Relation Age of Onset   Hyperlipidemia Mother    Diabetes Mother    Hypertension Mother    Parkinson's disease Mother    Kidney disease Mother    Kidney disease Father    Stroke Father    Heart failure Father     Hypertension Father    Heart disease Father    Diabetes Father    Cancer Father        colon, prostate, and kidney   Heart disease Maternal Grandmother    Diabetes Maternal Grandmother    Heart disease Maternal Grandfather    Diabetes Maternal Grandfather    Cancer Maternal Grandfather        pt unaware of what kind   Stroke Paternal Grandmother    Stroke Paternal Grandfather    Cancer Maternal Aunt        cervical vs ovarian   Colon cancer Paternal Aunt        dx less than 50   Breast cancer Paternal Aunt        dx in her 65s   Prostate cancer Paternal Uncle    Breast cancer Cousin        pat first cousin, dx over 37   Prostate cancer Other        PGF's father   Past Surgical History:  Procedure Laterality Date   APPENDECTOMY     BREAST BIOPSY  08-13-2009  DR TSUEI   EXCISION LEFT NIPPLE DUCT   BREAST EXCISIONAL BIOPSY Left    x2   BREAST EXCISIONAL BIOPSY Right    BREAST LUMPECTOMY Right 11/16/2018   invasive ductal   CHOLECYSTECTOMY  1992   COLON RESECTION  1986   ULCERATIVE COLITIS   COLON SURGERY     ESOPHAGEAL MANOMETRY N/A 06/11/2013   Procedure: ESOPHAGEAL MANOMETRY (EM);  Surgeon: Petra Kuba, MD;  Location: [facility];  Service: Endoscopy;  Laterality: N/A;   ESOPHAGOGASTRODUODENOSCOPY (EGD) WITH PROPOFOL N/A 05/31/2016   Procedure: ESOPHAGOGASTRODUODENOSCOPY (EGD) WITH PROPOFOL;  Surgeon: Vida Rigger, MD;  Location: [facility] ENDOSCOPY;  Service: Endoscopy;  Laterality: N/A;   EXCISION RIGHT BREAST MASS   10-20-2005  DR Theron Arista YOUNG   EYE SURGERY Bilateral 2023   cataract   FRACTURE SURGERY     left arm   kidney [name] removed  12/2009   KNEE ARTHROSCOPY Left    MCL   LEFT THUMB CARPOMETACARPAL JOINT SUSPENSIONPLASTY  04-06-2006  DR Mina Marble   LEFT WRIST ARTHROSCOPY W/ DEBRIDEMENT AND REMOVAL CYST  03-26-2004  DR Mina Marble   MYOMECTOMY     RADIOACTIVE SEED GUIDED PARTIAL MASTECTOMY WITH AXILLARY SENTINEL LYMPH NODE BIOPSY Right 11/16/2018   Procedure: RIGHT  BREAST RADIOACTIVE SEED GUIDED PARTIAL MASTECTOMY WITH  SENTINEL  NODE BIOPSY;  Surgeon: Abigail Miyamoto, MD;  Location: [facility] OR;  Service: General;  Laterality: Right;   RIGHT COLECTOMY     RIGHT URETEROSCOPIC [name] EXTRACTION  12-11-2009  DR Jonny Ruiz [facility]   TENDON RELEASE     TONSILLECTOMY     TOTAL HIP ARTHROPLASTY Right 11/26/2016   Procedure: RIGHT TOTAL HIP ARTHROPLASTY ANTERIOR APPROACH;  Surgeon: Kathryne Hitch, MD;  Location: [facility];  Service: Orthopedics;  Laterality: Right;   TOTAL KNEE ARTHROPLASTY Left 12/02/2017   Procedure: LEFT TOTAL KNEE ARTHROPLASTY;  Surgeon: Kathryne Hitch, MD;  Location: [facility];  Service: Orthopedics;  Laterality: Left;   TOTAL KNEE ARTHROPLASTY Right 08/03/2022   Procedure: RIGHT TOTAL KNEE ARTHROPLASTY;  Surgeon: Kathryne Hitch, MD;  Location: [facility] OR;  Service: Orthopedics;  Laterality: Right;   WRIST SURGERY     both   Social History   Occupational History   Occupation: Retired  Tobacco Use   Smoking status: Former    Current packs/day: 0.00    Average packs/day: 1 pack/day for 10.0 years (10.0 ttl pk-yrs)    Types: Cigarettes    Start date: 09/22/1972    Quit date: 09/22/1982    Years since quitting: 41.0   Smokeless tobacco: Never   Tobacco comments:    quit 1983  Vaping Use   Vaping status: Never Used  Substance and Sexual Activity   Alcohol use: No   Drug use: No   Sexual activity: Yes    Partners: Male    Birth control/protection: Post-menopausal

## 2023-10-11 NOTE — Progress Notes (Signed)
Patient says that the day of and the day after her last treatment she was feeling really good. She says that since then she has had more pain and difficulty sleeping at night due to pain. She says that she has her first physical therapy appointment this Thursday.

## 2023-10-13 ENCOUNTER — Ambulatory Visit: Payer: PPO | Admitting: Physical Therapy

## 2023-10-13 ENCOUNTER — Encounter: Payer: Self-pay | Admitting: Physical Therapy

## 2023-10-13 DIAGNOSIS — M25551 Pain in right hip: Secondary | ICD-10-CM

## 2023-10-13 DIAGNOSIS — M6281 Muscle weakness (generalized): Secondary | ICD-10-CM | POA: Diagnosis not present

## 2023-10-13 DIAGNOSIS — M25552 Pain in left hip: Secondary | ICD-10-CM | POA: Diagnosis not present

## 2023-10-13 DIAGNOSIS — R262 Difficulty in walking, not elsewhere classified: Secondary | ICD-10-CM

## 2023-10-13 NOTE — Therapy (Signed)
OUTPATIENT PHYSICAL THERAPY LOWER EXTREMITY EVALUATION   Patient Name: CAMAS KINSMAN MRN: [id number] DOB:[birth year], 77 y.o., female Today's Date: 10/13/2023  END OF SESSION:  PT End of Session - 10/13/23 1205     Visit Number 1    Number of Visits 8    Date for PT Re-Evaluation 11/24/23    PT Start Time 1100    PT Stop Time 1154    PT Time Calculation (min) 54 min    Activity Tolerance Patient tolerated treatment well    Behavior During Therapy [facility] for tasks assessed/performed             Past Medical History:  Diagnosis Date   Anxiety    Arthritis    Back, Hip- right   Atypical chest pain 06/25/2021   Back pain    Cancer (HCC)    right breast   Cataract    Colitis    Coronary artery calcification 02/20/2020   Diabetes mellitus without complication (HCC)    Type II   Edema 03/05/2021   Edema of both lower extremities    Endometriosis    Family history of adverse reaction to anesthesia    Father - N/V - blood pressure; daughter N/V   Family history of breast cancer    Family history of colon cancer    Family history of kidney cancer    Family history of prostate cancer    Fatty liver    GERD (gastroesophageal reflux disease)    History of hiatal hernia    History of kidney stones    History of ulcerative colitis    Hypertension    Insomnia    Joint pain    Kidney problem    Lower extremity edema 03/05/2021   Lumbar disc disease    L4 L5   Neuropathy    Osteoarthritis    Other hyperlipidemia    Palpitations    Personal history of radiation therapy    Pneumonia    1983, 2003   Pure hypercholesterolemia 02/20/2020   PVCs (premature ventricular contractions)    benign   Umbilical hernia    Vitamin D deficiency    Wears glasses    Past Surgical History:  Procedure Laterality Date   APPENDECTOMY     BREAST BIOPSY  08-13-2009  DR TSUEI   EXCISION LEFT NIPPLE DUCT   BREAST EXCISIONAL BIOPSY Left    x2   BREAST EXCISIONAL BIOPSY Right     BREAST LUMPECTOMY Right 11/16/2018   invasive ductal   CHOLECYSTECTOMY  1992   COLON RESECTION  1986   ULCERATIVE COLITIS   COLON SURGERY     ESOPHAGEAL MANOMETRY N/A 06/11/2013   Procedure: ESOPHAGEAL MANOMETRY (EM);  Surgeon: Petra Kuba, MD;  Location: [facility];  Service: Endoscopy;  Laterality: N/A;   ESOPHAGOGASTRODUODENOSCOPY (EGD) WITH PROPOFOL N/A 05/31/2016   Procedure: ESOPHAGOGASTRODUODENOSCOPY (EGD) WITH PROPOFOL;  Surgeon: Vida Rigger, MD;  Location: [facility] ENDOSCOPY;  Service: Endoscopy;  Laterality: N/A;   EXCISION RIGHT BREAST MASS   10-20-2005  DR Theron Arista YOUNG   EYE SURGERY Bilateral 2023   cataract   FRACTURE SURGERY     left arm   kidney stone removed  12/2009   KNEE ARTHROSCOPY Left    MCL   LEFT THUMB CARPOMETACARPAL JOINT SUSPENSIONPLASTY  04-06-2006  DR Mina Marble   LEFT WRIST ARTHROSCOPY W/ DEBRIDEMENT AND REMOVAL CYST  03-26-2004  DR Mina Marble   MYOMECTOMY     RADIOACTIVE SEED GUIDED PARTIAL MASTECTOMY WITH AXILLARY SENTINEL  LYMPH NODE BIOPSY Right 11/16/2018   Procedure: RIGHT BREAST RADIOACTIVE SEED GUIDED PARTIAL MASTECTOMY WITH  SENTINEL  NODE BIOPSY;  Surgeon: Abigail Miyamoto, MD;  Location: [facility] OR;  Service: General;  Laterality: Right;   RIGHT COLECTOMY     RIGHT URETEROSCOPIC STONE EXTRACTION  12-11-2009  DR Jonny Ruiz [facility]   TENDON RELEASE     TONSILLECTOMY     TOTAL HIP ARTHROPLASTY Right 11/26/2016   Procedure: RIGHT TOTAL HIP ARTHROPLASTY ANTERIOR APPROACH;  Surgeon: Kathryne Hitch, MD;  Location: [facility] ORS;  Service: Orthopedics;  Laterality: Right;   TOTAL KNEE ARTHROPLASTY Left 12/02/2017   Procedure: LEFT TOTAL KNEE ARTHROPLASTY;  Surgeon: Kathryne Hitch, MD;  Location: [facility] OR[facility];  Service: Orthopedics;  Laterality: Left;   TOTAL KNEE ARTHROPLASTY Right 08/03/2022   Procedure: RIGHT TOTAL KNEE ARTHROPLASTY;  Surgeon: Kathryne Hitch, MD;  Location: [facility] OR;  Service: Orthopedics;  Laterality: Right;   WRIST SURGERY     both    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/03/2022   Stress 07/19/2022   Other constipation 07/19/2022   Atypical chest pain 06/25/2021   Class 2 severe obesity with serious comorbidity and body mass index (BMI) of 37.0 to 37.9 in adult (HCC) 06/03/2021   Vitamin D deficiency 06/03/2021   Other fatigue 04/22/2021   SOB (shortness of breath) on exertion 04/22/2021   Lower extremity edema 03/05/2021   Coronary artery calcification 02/20/2020   Pure hypercholesterolemia 02/20/2020   Unilateral primary osteoarthritis, right knee 01/03/2019   Genetic testing 12/07/2018   Family history of breast cancer    Family history of prostate cancer    Family history of colon cancer    Family history of kidney cancer    Malignant neoplasm of upper-outer quadrant of right breast in female, 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/09/2018   Diabetic neuropathy, type II diabetes mellitus (HCC) 11/09/2018   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/02/2017   Trochanteric bursitis, right hip 05/10/2017   Status post total replacement of right hip 11/26/2016   DDD (degenerative disc disease), 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   Low back pain 04/23/2015   Hiatal hernia 04/13/2013   Diabetes mellitus (HCC) 01/26/2013   Umbilical hernia 07/24/2012   Essential hypertension     PCP: Daisy Floro, MD   REFERRING PROVIDER: Madelyn Brunner, DO   REFERRING DIAG: M70.71 (ICD-10-CM) - Iliopsoas bursitis of right hip Z96.641 (ICD-10-CM) - History of right hip replacement M25.551 (ICD-10-CM) - Pain in right hip M16.12  (ICD-10-CM) - Unilateral primary osteoarthritis, left hip  THERAPY DIAG:  Pain in right hip  Pain in left hip  Muscle weakness (generalized)  Difficulty in walking, not elsewhere classified  Rationale for Evaluation and Treatment: Rehabilitation  ONSET DATE: Chronic pain, had Rt THA 2018.  SUBJECTIVE:   SUBJECTIVE STATEMENT: She has pain and difficulty in both hips, left hip will be replaced sometime in January. Rt hip was previously replaced and has both knees replaced. She developed a problem in IT band after her latest knee replacement. She also reports back pain and difficulty walking.   PERTINENT HISTORY: Anx, insomnia, DM, bilat TKA,  Rt THA 2018, OA  PAIN:  NPRS scale: 7-8/10 upon arrival Pain location:Rt anterior/lateral proximal hip Pain description: constant, achy, sharp Aggravating factors: walking, getting in/out of car, stairs, standing more than 10 minutes, housework Relieving factors: heat, rest   PRECAUTIONS: None  RED FLAGS: None   WEIGHT BEARING RESTRICTIONS: No  FALLS:  Has patient fallen in last 6 months? No   PLOF: Independent with basic ADLs  PATIENT GOALS: reduce pain, improve walking and function   OBJECTIVE:  Note: Objective measures were completed at Evaluation unless otherwise noted.  DIAGNOSTIC FINDINGS:  MRI IMPRESSION 04/29/23: 1. Right total hip arthroplasty without periarticular fluid collection or osteolysis. No hardware failure or complication. 2. Severe osteoarthritis of the left hip. Moderate left hip joint effusion. 3. Small partial-thickness tear of the hamstring origin bilaterally.  PATIENT SURVEYS:  Eval: FOTO 33% functional, goal is 50%  COGNITION: Overall cognitive status: Within functional limits for tasks assessed     SENSATION: WFL  MUSCLE LENGTH: Tight ITB, hip flexors, quads bilat   LOWER EXTREMITY ROM:  Active ROM Right eval Left eval  Hip flexion 80 50  Hip extension 10 10  Hip abduction 25 25   Hip adduction    Hip internal rotation 25 10  Hip external rotation 15 25  Knee flexion    Knee extension    Ankle dorsiflexion    Ankle plantarflexion    Ankle inversion    Ankle eversion     (Blank rows = not tested)  LOWER EXTREMITY MMT:  MMT Right eval Left eval  Hip flexion 4 2-  Hip extension 3+ 3+  Hip abduction 3+ 3+  Hip adduction 4 4  Hip internal rotation 4 4  Hip external rotation 4 4  Knee flexion 5 5  Knee extension 5 5  Ankle dorsiflexion    Ankle plantarflexion    Ankle inversion    Ankle eversion     (Blank rows = not tested)  FUNCTIONAL TESTS:  Eval 5 times sit to stand: 20;56 no UE support Timed up and go (TUG): 16:47 with SPC  GAIT: Distance walked: 100 feet Assistive device utilized: Single point cane Level of assistance: Modified independence TODAY'S TREATMENT:  Eval Nu step L4-3 X 5 min UE/LE Moist heat X 10 min to low back/posterior hip, as well as Rt anterior/lateral hip HEP creation and review with demonstration and trial set preformed, see below for details    PATIENT EDUCATION: Education details: HEP, PT plan of care Person educated: Patient Education method: Explanation, Demonstration, Verbal cues, and Handouts Education comprehension: verbalized understanding and needs further education   HOME EXERCISE PROGRAM: Access Code: [email or web address] URL: [email or web address] Date: 10/13/2023 Prepared by: Ivery Quale  Exercises - Supine ITB Stretch with Strap  - 2 x daily - 6 x weekly - 2-3 reps - 30 sec hold - Supine Quadriceps Stretch with Strap on Table  - 2 x daily - 6 x weekly - 3 reps - 30 sec hold - alternating marching  - 2 x daily - 6 x weekly - 1-2 sets - 10 reps - Standing Hip Abduction with Counter Support  - 2 x daily - 6 x weekly - 1-2 sets - 10 reps - Sit to Stand Without Arm Support  - 2 x daily - 6 x weekly - 1-2 sets - 10 reps - Seated Straight Leg Raise with Quad Contraction  - 2 x daily - 6 x weekly -  1-2 sets - 10 reps  ASSESSMENT:  CLINICAL IMPRESSION: Patient referred to PT for Rt hip pain, bursitis, and issues with anterior hip flexors. MD also recommended to work on her gait as well as prehab for her left hip arthritis with planned replacement. She does have Rt THA 2018 and bilat TKA. Patient will benefit from skilled PT to address below impairments, limitations and improve overall function.  OBJECTIVE IMPAIRMENTS: decreased activity tolerance, difficulty walking, decreased balance, decreased endurance, decreased mobility, decreased ROM, decreased strength, impaired flexibility, impaired LE use, postural dysfunction, and pain.  ACTIVITY LIMITATIONS: bending, lifting, carry, locomotion, cleaning, community activity,  PERSONAL FACTORS: see above PMH are also affecting patient's functional outcome.  REHAB POTENTIAL: Good  CLINICAL DECISION MAKING: Stable/uncomplicated  EVALUATION COMPLEXITY: Low    GOALS: Short term PT Goals Target date: 11/10/2023   Pt will be I and compliant with HEP. Baseline:  Goal status: New Pt will decrease pain by 25% overall Baseline: Goal status: New  Long term PT goals Target date:11/24/2023   Pt will improve hip AROM to [facility] to improve functional mobility Baseline: Goal status: New Pt will improve  hip/knee strength to at least 4+/5 MMT to improve functional strength Baseline: Goal status: New Pt will improve FOTO to at least 50% functional to show improved function Baseline: Goal status: New Pt will reduce pain to overall less than 4/10 with usual activity and housework Baseline:8 Goal status: New  PLAN: PT FREQUENCY: 1-2 times per week   PT DURATION: 4-6 weeks  PLANNED INTERVENTIONS (unless contraindicated): aquatic PT, Canalith repositioning, cryotherapy, Electrical stimulation, Iontophoresis with 4 mg/ml dexamethasome, Moist heat, traction, Ultrasound, gait training, Therapeutic exercise, balance training, neuromuscular  re-education, patient/family education,  manual techniques, passive ROM, dry needling, taping, vasopnuematic device, vestibular, spinal manipulations, joint manipulations 97110-Therapeutic exercises, 97530- Therapeutic activity, 97112- Neuromuscular re-education, 97535- Self Care, and [id number]- Manual therapy  PLAN FOR NEXT SESSION: Review and update HEP PRN, general and gentle hip strength and ROM as tolerated, gait, balance. Consider aquatic PT.    April Manson, PT,DPT 10/13/2023, 12:06 PM

## 2023-10-14 DIAGNOSIS — E119 Type 2 diabetes mellitus without complications: Secondary | ICD-10-CM | POA: Diagnosis not present

## 2023-10-14 DIAGNOSIS — E7800 Pure hypercholesterolemia, unspecified: Secondary | ICD-10-CM | POA: Diagnosis not present

## 2023-10-14 DIAGNOSIS — Z0000 Encounter for general adult medical examination without abnormal findings: Secondary | ICD-10-CM | POA: Diagnosis not present

## 2023-10-14 DIAGNOSIS — Z6834 Body mass index (BMI) 34.0-34.9, adult: Secondary | ICD-10-CM | POA: Diagnosis not present

## 2023-10-14 DIAGNOSIS — I10 Essential (primary) hypertension: Secondary | ICD-10-CM | POA: Diagnosis not present

## 2023-10-14 DIAGNOSIS — N393 Stress incontinence (female) (male): Secondary | ICD-10-CM | POA: Diagnosis not present

## 2023-10-14 DIAGNOSIS — F430 Acute stress reaction: Secondary | ICD-10-CM | POA: Diagnosis not present

## 2023-10-14 DIAGNOSIS — M79675 Pain in left toe(s): Secondary | ICD-10-CM | POA: Diagnosis not present

## 2023-10-14 DIAGNOSIS — E669 Obesity, unspecified: Secondary | ICD-10-CM | POA: Diagnosis not present

## 2023-10-14 DIAGNOSIS — G2581 Restless legs syndrome: Secondary | ICD-10-CM | POA: Diagnosis not present

## 2023-10-14 DIAGNOSIS — E559 Vitamin D deficiency, unspecified: Secondary | ICD-10-CM | POA: Diagnosis not present

## 2023-10-14 DIAGNOSIS — K219 Gastro-esophageal reflux disease without esophagitis: Secondary | ICD-10-CM | POA: Diagnosis not present

## 2023-10-18 ENCOUNTER — Ambulatory Visit: Payer: PPO | Admitting: Sports Medicine

## 2023-10-19 DIAGNOSIS — M47817 Spondylosis without myelopathy or radiculopathy, lumbosacral region: Secondary | ICD-10-CM | POA: Diagnosis not present

## 2023-10-19 DIAGNOSIS — M150 Primary generalized (osteo)arthritis: Secondary | ICD-10-CM | POA: Diagnosis not present

## 2023-10-19 DIAGNOSIS — M25552 Pain in left hip: Secondary | ICD-10-CM | POA: Diagnosis not present

## 2023-10-19 DIAGNOSIS — M25551 Pain in right hip: Secondary | ICD-10-CM | POA: Diagnosis not present

## 2023-10-24 ENCOUNTER — Ambulatory Visit (INDEPENDENT_AMBULATORY_CARE_PROVIDER_SITE_OTHER): Payer: PPO | Admitting: Family Medicine

## 2023-11-01 ENCOUNTER — Ambulatory Visit: Payer: PPO | Admitting: Sports Medicine

## 2023-11-01 ENCOUNTER — Encounter: Payer: PPO | Admitting: Physical Therapy

## 2023-11-03 ENCOUNTER — Ambulatory Visit: Payer: PPO | Admitting: Physical Therapy

## 2023-11-03 ENCOUNTER — Encounter: Payer: Self-pay | Admitting: Physical Therapy

## 2023-11-03 DIAGNOSIS — M25551 Pain in right hip: Secondary | ICD-10-CM | POA: Diagnosis not present

## 2023-11-03 DIAGNOSIS — M25552 Pain in left hip: Secondary | ICD-10-CM

## 2023-11-03 DIAGNOSIS — R262 Difficulty in walking, not elsewhere classified: Secondary | ICD-10-CM | POA: Diagnosis not present

## 2023-11-03 DIAGNOSIS — M6281 Muscle weakness (generalized): Secondary | ICD-10-CM | POA: Diagnosis not present

## 2023-11-03 NOTE — Therapy (Signed)
 OUTPATIENT PHYSICAL THERAPY TREATMENT  Patient Name: Lauren [name] MRN: [id number] DOB:07/05/1946, 78 y.o., female Today's Date: 11/03/2023  END OF SESSION:  PT End of Session - 11/03/23 0948     Visit Number 2    Number of Visits 8    Date for PT Re-Evaluation 11/24/23    PT Start Time 0939    PT Stop Time 1017    PT Time Calculation (min) 38 min    Activity Tolerance Patient tolerated treatment well    Behavior During Therapy [facility] for tasks assessed/performed             Past Medical History:  Diagnosis Date   Anxiety    Arthritis    Back, Hip- right   Atypical chest pain 06/25/2021   Back pain    Cancer (HCC)    right breast   Cataract    Colitis    Coronary artery calcification 02/20/2020   Diabetes mellitus without complication (HCC)    Type II   Edema 03/05/2021   Edema of both lower extremities    Endometriosis    Family history of adverse reaction to anesthesia    Father - N/V - blood pressure; daughter N/V   Family history of breast cancer    Family history of colon cancer    Family history of kidney cancer    Family history of prostate cancer    Fatty liver    GERD (gastroesophageal reflux disease)    History of hiatal hernia    History of kidney stones    History of ulcerative colitis    Hypertension    Insomnia    Joint pain    Kidney problem    Lower extremity edema 03/05/2021   Lumbar disc disease    L4 L5   Neuropathy    Osteoarthritis    Other hyperlipidemia    Palpitations    Personal history of radiation therapy    Pneumonia    1983, 2003   Pure hypercholesterolemia 02/20/2020   PVCs (premature ventricular contractions)    benign   Umbilical hernia    Vitamin D  deficiency    Wears glasses    Past Surgical History:  Procedure Laterality Date   APPENDECTOMY     BREAST BIOPSY  08-13-2009  DR TSUEI   EXCISION LEFT NIPPLE DUCT   BREAST EXCISIONAL BIOPSY Left    x2   BREAST EXCISIONAL BIOPSY Right    BREAST LUMPECTOMY  Right 11/16/2018   invasive ductal   CHOLECYSTECTOMY  1992   COLON RESECTION  1986   ULCERATIVE COLITIS   COLON SURGERY     ESOPHAGEAL MANOMETRY N/A 06/11/2013   Procedure: ESOPHAGEAL MANOMETRY (EM);  Surgeon: Oliva FORBES Boots, MD;  Location: [facility];  Service: Endoscopy;  Laterality: N/A;   ESOPHAGOGASTRODUODENOSCOPY (EGD) WITH PROPOFOL  N/A 05/31/2016   Procedure: ESOPHAGOGASTRODUODENOSCOPY (EGD) WITH PROPOFOL ;  Surgeon: Oliva Boots, MD;  Location: [facility] ENDOSCOPY;  Service: Endoscopy;  Laterality: N/A;   EXCISION RIGHT BREAST MASS   10-20-2005  DR MAUDE YOUNG   EYE SURGERY Bilateral 2023   cataract   FRACTURE SURGERY     left arm   kidney stone removed  12/2009   KNEE ARTHROSCOPY Left    MCL   LEFT THUMB CARPOMETACARPAL JOINT SUSPENSIONPLASTY  04-06-2006  DR SISSY   LEFT WRIST ARTHROSCOPY W/ DEBRIDEMENT AND REMOVAL CYST  03-26-2004  DR SISSY   MYOMECTOMY     RADIOACTIVE SEED GUIDED PARTIAL MASTECTOMY WITH AXILLARY SENTINEL LYMPH NODE BIOPSY  Right 11/16/2018   Procedure: RIGHT BREAST RADIOACTIVE SEED GUIDED PARTIAL MASTECTOMY WITH  SENTINEL  NODE BIOPSY;  Surgeon: Vernetta Berg, MD;  Location: [facility] OR;  Service: General;  Laterality: Right;   RIGHT COLECTOMY     RIGHT URETEROSCOPIC STONE EXTRACTION  12-11-2009  DR NORLEEN [facility]   TENDON RELEASE     TONSILLECTOMY     TOTAL HIP ARTHROPLASTY Right 11/26/2016   Procedure: RIGHT TOTAL HIP ARTHROPLASTY ANTERIOR APPROACH;  Surgeon: Lonni CINDERELLA Vernetta, MD;  Location: [facility] ORS;  Service: Orthopedics;  Laterality: Right;   TOTAL KNEE ARTHROPLASTY Left 12/02/2017   Procedure: LEFT TOTAL KNEE ARTHROPLASTY;  Surgeon: Vernetta Lonni CINDERELLA, MD;  Location: [facility] OR[facility];  Service: Orthopedics;  Laterality: Left;   TOTAL KNEE ARTHROPLASTY Right 08/03/2022   Procedure: RIGHT TOTAL KNEE ARTHROPLASTY;  Surgeon: Vernetta Lonni CINDERELLA, MD;  Location: [facility] OR;  Service: Orthopedics;  Laterality: Right;   WRIST SURGERY     both   Patient Active Problem  List   Diagnosis Date Noted   Restless leg syndrome 09/22/2023   Other hyperlipidemia 06/08/2023   Unilateral primary osteoarthritis, left hip 05/11/2023   Depression 03/16/2023   Allergic rhinitis 12/29/2022   Seasonal affective disorder (HCC) 12/01/2022   BMI 33.0-33.9,adult 12/01/2022   Obesity, Beginning BMI 37.59 12/01/2022   OA (osteoarthritis) of knee 08/03/2022   Status post total right knee replacement 08/03/2022   Stress 07/19/2022   Other constipation 07/19/2022   Atypical chest pain 06/25/2021   Class 2 severe obesity with serious comorbidity and body mass index (BMI) of 37.0 to 37.9 in adult (HCC) 06/03/2021   Vitamin D  deficiency 06/03/2021   Other fatigue 04/22/2021   SOB (shortness of breath) on exertion 04/22/2021   Lower extremity edema 03/05/2021   Coronary artery calcification 02/20/2020   Pure hypercholesterolemia 02/20/2020   Unilateral primary osteoarthritis, right knee 01/03/2019   Genetic testing 12/07/2018   Family history of breast cancer    Family history of prostate cancer    Family history of colon cancer    Family history of kidney cancer    Malignant neoplasm of upper-outer quadrant of right breast in female, 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/09/2018   Diabetic neuropathy, type II diabetes mellitus (HCC) 11/09/2018   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/02/2017   Trochanteric bursitis, right hip 05/10/2017   Status post total replacement of right hip 11/26/2016   DDD (degenerative disc disease), 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   Low back pain 04/23/2015   Hiatal hernia 04/13/2013   Diabetes mellitus (HCC) 01/26/2013   Umbilical hernia 07/24/2012   Essential hypertension     PCP: Okey Carlin Redbird, MD   REFERRING PROVIDER: Burnetta Brunet, DO   REFERRING DIAG: M70.71 (ICD-10-CM) - Iliopsoas bursitis of right hip Z96.641 (ICD-10-CM) - History of right hip replacement M25.551 (ICD-10-CM) - Pain in right hip M16.12 (ICD-10-CM) - Unilateral  primary osteoarthritis, left hip  THERAPY DIAG:  Pain in right hip  Pain in left hip  Muscle weakness (generalized)  Difficulty in walking, not elsewhere classified  Rationale for Evaluation and Treatment: Rehabilitation  ONSET DATE: Chronic pain, had Rt THA 2018.  SUBJECTIVE:   SUBJECTIVE STATEMENT: She continues to have pain in her hips and is not sleeping well. She has surgery planned for February 4th for left THA. She has been doing her HEP. PERTINENT HISTORY: Anx, insomnia, DM, bilat TKA, Rt THA 2018, OA  PAIN:  NPRS scale: 4-5/10 upon arrival Pain location:Rt anterior/lateral proximal hip and left posterior hip Pain  description: constant, achy, sharp Aggravating factors: walking, getting in/out of car, stairs, standing more than 10 minutes, housework Relieving factors: heat, rest   PRECAUTIONS: None  RED FLAGS: None   WEIGHT BEARING RESTRICTIONS: No  FALLS:  Has patient fallen in last 6 months? No   PLOF: Independent with basic ADLs  PATIENT GOALS: reduce pain, improve walking and function   OBJECTIVE:  Note: Objective measures were completed at Evaluation unless otherwise noted.  DIAGNOSTIC FINDINGS:  MRI IMPRESSION 04/29/23: 1. Right total hip arthroplasty without periarticular fluid collection or osteolysis. No hardware failure or complication. 2. Severe osteoarthritis of the left hip. Moderate left hip joint effusion. 3. Small partial-thickness tear of the hamstring origin bilaterally.  PATIENT SURVEYS:  Eval: FOTO 33% functional, goal is 50%  COGNITION: Overall cognitive status: Within functional limits for tasks assessed     SENSATION: WFL  MUSCLE LENGTH: Tight ITB, hip flexors, quads bilat   LOWER EXTREMITY ROM:  Active ROM Right eval Left eval  Hip flexion 80 50  Hip extension 10 10  Hip abduction 25 25  Hip adduction    Hip internal rotation 25 10  Hip external rotation 15 25  Knee flexion    Knee extension    Ankle  dorsiflexion    Ankle plantarflexion    Ankle inversion    Ankle eversion     (Blank rows = not tested)  LOWER EXTREMITY MMT:  MMT Right eval Left eval  Hip flexion 4 2-  Hip extension 3+ 3+  Hip abduction 3+ 3+  Hip adduction 4 4  Hip internal rotation 4 4  Hip external rotation 4 4  Knee flexion 5 5  Knee extension 5 5  Ankle dorsiflexion    Ankle plantarflexion    Ankle inversion    Ankle eversion     (Blank rows = not tested)  FUNCTIONAL TESTS:  Eval 5 times sit to stand: 20;56 no UE support Timed up and go (TUG): 16:47 with SPC  GAIT: Distance walked: 100 feet Assistive device utilized: Single point cane Level of assistance: Modified independence TODAY'S TREATMENT:  11/03/23 Nu step L5 X 8  min UE/LE, seat #5 Slantboard stretch 30 sec X 3 Leg press machine 50# DL [id number], SL 74# K89 each side Low trunk rotations 5 sec X 10 bilat HEP review Supine ITB stretch 20 sec X 3 bilat Supine hip flexor/quad stretch 20 se X 3 Seated SLR X 10 bilat Standing alternating marches X 10 bilat Standing alternating hip abductions X 10 bilat   Eval Nu step L4-3 X 5 min UE/LE Moist heat X 10 min to low back/posterior hip, as well as Rt anterior/lateral hip HEP creation and review with demonstration and trial set preformed, see below for details    PATIENT EDUCATION: Education details: HEP, PT plan of care Person educated: Patient Education method: Explanation, Demonstration, Verbal cues, and Handouts Education comprehension: verbalized understanding and needs further education   HOME EXERCISE PROGRAM: Access Code: [email or web address] URL: [email or web address] Date: 10/13/2023 Prepared by: Redell Moose  Exercises - Supine ITB Stretch with Strap  - 2 x daily - 6 x weekly - 2-3 reps - 30 sec hold - Supine Quadriceps Stretch with Strap on Table  - 2 x daily - 6 x weekly - 3 reps - 30 sec hold - alternating marching  - 2 x daily - 6 x weekly - 1-2 sets - 10 reps -  Standing Hip Abduction with Counter Support  - 2 x daily - 6  x weekly - 1-2 sets - 10 reps - Sit to Stand Without Arm Support  - 2 x daily - 6 x weekly - 1-2 sets - 10 reps - Seated Straight Leg Raise with Quad Contraction  - 2 x daily - 6 x weekly - 1-2 sets - 10 reps  ASSESSMENT:  CLINICAL IMPRESSION: Session focused on bilateral hip and knee strengthening and ROM to her tolerance with instructions to keep exercises in gentle pain free ROM. She has been compliant with HEP and shows good understanding of them today.   OBJECTIVE IMPAIRMENTS: decreased activity tolerance, difficulty walking, decreased balance, decreased endurance, decreased mobility, decreased ROM, decreased strength, impaired flexibility, impaired LE use, postural dysfunction, and pain.  ACTIVITY LIMITATIONS: bending, lifting, carry, locomotion, cleaning, community activity,  PERSONAL FACTORS: see above PMH are also affecting patient's functional outcome.  REHAB POTENTIAL: Good  CLINICAL DECISION MAKING: Stable/uncomplicated  EVALUATION COMPLEXITY: Low    GOALS: Short term PT Goals Target date: 11/10/2023   Pt will be I and compliant with HEP. Baseline:  Goal status: New Pt will decrease pain by 25% overall Baseline: Goal status: New  Long term PT goals Target date:11/24/2023   Pt will improve hip AROM to [facility] to improve functional mobility Baseline: Goal status: New Pt will improve  hip/knee strength to at least 4+/5 MMT to improve functional strength Baseline: Goal status: New Pt will improve FOTO to at least 50% functional to show improved function Baseline: Goal status: New Pt will reduce pain to overall less than 4/10 with usual activity and housework Baseline:8 Goal status: New  PLAN: PT FREQUENCY: 1-2 times per week   PT DURATION: 4-6 weeks  PLANNED INTERVENTIONS (unless contraindicated): aquatic PT, Canalith repositioning, cryotherapy, Electrical stimulation, Iontophoresis with 4 mg/ml  dexamethasome, Moist heat, traction, Ultrasound, gait training, Therapeutic exercise, balance training, neuromuscular re-education, patient/family education,  manual techniques, passive ROM, dry needling, taping, vasopnuematic device, vestibular, spinal manipulations, joint manipulations [id number]-Therapeutic exercises, 97530- Therapeutic activity, 97112- Neuromuscular re-education, 97535- Self Care, and [id number]- Manual therapy  PLAN FOR NEXT SESSION: general and gentle hip strength and ROM as tolerated, gait, balance. Consider aquatic PT. Continue PT for prehab also until planned THA 12/05/22    Redell JONELLE Moose, PT,DPT 11/03/2023, 10:28 AM

## 2023-11-08 ENCOUNTER — Ambulatory Visit: Payer: PPO | Admitting: Physical Therapy

## 2023-11-08 ENCOUNTER — Encounter: Payer: Self-pay | Admitting: Physical Therapy

## 2023-11-08 DIAGNOSIS — M25551 Pain in right hip: Secondary | ICD-10-CM | POA: Diagnosis not present

## 2023-11-08 DIAGNOSIS — M25552 Pain in left hip: Secondary | ICD-10-CM | POA: Diagnosis not present

## 2023-11-08 DIAGNOSIS — R262 Difficulty in walking, not elsewhere classified: Secondary | ICD-10-CM | POA: Diagnosis not present

## 2023-11-08 DIAGNOSIS — M6281 Muscle weakness (generalized): Secondary | ICD-10-CM | POA: Diagnosis not present

## 2023-11-08 NOTE — Therapy (Signed)
 OUTPATIENT PHYSICAL THERAPY TREATMENT  Patient Name: Lauren [name] MRN: [id number] DOB:1946/06/14, 78 y.o., female Today's Date: 11/08/2023  END OF SESSION:  PT End of Session - 11/08/23 1102     Visit Number 3    Number of Visits 8    Date for PT Re-Evaluation 11/24/23    PT Start Time 1023    PT Stop Time 1101    PT Time Calculation (min) 38 min    Activity Tolerance Patient tolerated treatment well    Behavior During Therapy [facility] for tasks assessed/performed              Past Medical History:  Diagnosis Date   Anxiety    Arthritis    Back, Hip- right   Atypical chest pain 06/25/2021   Back pain    Cancer (HCC)    right breast   Cataract    Colitis    Coronary artery calcification 02/20/2020   Diabetes mellitus without complication (HCC)    Type II   Edema 03/05/2021   Edema of both lower extremities    Endometriosis    Family history of adverse reaction to anesthesia    Father - N/V - blood pressure; daughter N/V   Family history of breast cancer    Family history of colon cancer    Family history of kidney cancer    Family history of prostate cancer    Fatty liver    GERD (gastroesophageal reflux disease)    History of hiatal hernia    History of kidney stones    History of ulcerative colitis    Hypertension    Insomnia    Joint pain    Kidney problem    Lower extremity edema 03/05/2021   Lumbar disc disease    L4 L5   Neuropathy    Osteoarthritis    Other hyperlipidemia    Palpitations    Personal history of radiation therapy    Pneumonia    1983, 2003   Pure hypercholesterolemia 02/20/2020   PVCs (premature ventricular contractions)    benign   Umbilical hernia    Vitamin D  deficiency    Wears glasses    Past Surgical History:  Procedure Laterality Date   APPENDECTOMY     BREAST BIOPSY  08-13-2009  DR TSUEI   EXCISION LEFT NIPPLE DUCT   BREAST EXCISIONAL BIOPSY Left    x2   BREAST EXCISIONAL BIOPSY Right    BREAST LUMPECTOMY  Right 11/16/2018   invasive ductal   CHOLECYSTECTOMY  1992   COLON RESECTION  1986   ULCERATIVE COLITIS   COLON SURGERY     ESOPHAGEAL MANOMETRY N/A 06/11/2013   Procedure: ESOPHAGEAL MANOMETRY (EM);  Surgeon: Oliva FORBES Boots, MD;  Location: [facility];  Service: Endoscopy;  Laterality: N/A;   ESOPHAGOGASTRODUODENOSCOPY (EGD) WITH PROPOFOL  N/A 05/31/2016   Procedure: ESOPHAGOGASTRODUODENOSCOPY (EGD) WITH PROPOFOL ;  Surgeon: Oliva Boots, MD;  Location: [facility] END[facility]COPY;  Service: Endoscopy;  Laterality: N/A;   EXCISION RIGHT BREAST MASS   10-20-2005  DR MAUDE YOUNG   EYE SURGERY Bilateral 2023   cataract   FRACTURE SURGERY     left arm   kidney stone removed  12/2009   KNEE ARTHROSCOPY Left    MCL   LEFT THUMB CARPOMETACARPAL JOINT SUSPENSIONPLASTY  04-06-2006  DR SISSY   LEFT WRIST ARTHROSCOPY W/ DEBRIDEMENT AND REMOVAL CYST  03-26-2004  DR SISSY   MYOMECTOMY     RADIOACTIVE SEED GUIDED PARTIAL MASTECTOMY WITH AXILLARY SENTINEL LYMPH NODE  BIOPSY Right 11/16/2018   Procedure: RIGHT BREAST RADIOACTIVE SEED GUIDED PARTIAL MASTECTOMY WITH  SENTINEL  NODE BIOPSY;  Surgeon: Vernetta Berg, MD;  Location: [facility] OR;  Service: General;  Laterality: Right;   RIGHT COLECTOMY     RIGHT URETEROSCOPIC STONE EXTRACTION  12-11-2009  DR [facility]   TENDON RELEASE     TONSILLECTOMY     TOTAL HIP ARTHROPLASTY Right 11/26/2016   Procedure: RIGHT TOTAL HIP ARTHROPLASTY ANTERIOR APPROACH;  Surgeon: Lonni CINDERELLA Vernetta, MD;  Location: [facility];  Service: Orthopedics;  Laterality: Right;   TOTAL KNEE ARTHROPLASTY Left 12/02/2017   Procedure: LEFT TOTAL KNEE ARTHROPLASTY;  Surgeon: Vernetta Lonni CINDERELLA, MD;  Location: [facility];  Service: Orthopedics;  Laterality: Left;   TOTAL KNEE ARTHROPLASTY Right 08/03/2022   Procedure: RIGHT TOTAL KNEE ARTHROPLASTY;  Surgeon: Vernetta Lonni CINDERELLA, MD;  Location: [facility] OR;  Service: Orthopedics;  Laterality: Right;   WRIST SURGERY     both   Patient Active Problem  List   Diagnosis Date Noted   Restless leg syndrome 09/22/2023   Other hyperlipidemia 06/08/2023   Unilateral primary osteoarthritis, left hip 05/11/2023   Depression 03/16/2023   Allergic rhinitis 12/29/2022   Seasonal affective disorder (HCC) 12/01/2022   BMI 33.0-33.9,adult 12/01/2022   Obesity, Beginning BMI 37.59 12/01/2022   OA (osteoarthritis) of knee 08/03/2022   Status post total right knee replacement 08/03/2022   Stress 07/19/2022   Other constipation 07/19/2022   Atypical chest pain 06/25/2021   Class 2 severe obesity with serious comorbidity and body mass index (BMI) of 37.0 to 37.9 in adult (HCC) 06/03/2021   Vitamin D  deficiency 06/03/2021   Other fatigue 04/22/2021   SOB (shortness of breath) on exertion 04/22/2021   Lower extremity edema 03/05/2021   Coronary artery calcification 02/20/2020   Pure hypercholesterolemia 02/20/2020   Unilateral primary osteoarthritis, right knee 01/03/2019   Genetic testing 12/07/2018   Family history of breast cancer    Family history of prostate cancer    Family history of colon cancer    Family history of kidney cancer    Malignant neoplasm of upper-outer quadrant of right breast in female, 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/09/2018   Diabetic neuropathy, type II diabetes mellitus (HCC) 11/09/2018   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/02/2017   Trochanteric bursitis, right hip 05/10/2017   Status post total replacement of right hip 11/26/2016   DDD (degenerative disc disease), 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   Low back pain 04/23/2015   Hiatal hernia 04/13/2013   Diabetes mellitus (HCC) 01/26/2013   Umbilical hernia 07/24/2012   Essential hypertension     PCP: Okey Carlin Redbird, MD   REFERRING PROVIDER: Burnetta Brunet, DO   REFERRING DIAG: M70.71 (ICD-10-CM) - Iliopsoas bursitis of right hip Z96.641 (ICD-10-CM) - History of right hip replacement M25.551 (ICD-10-CM) - Pain in right hip M16.12 (ICD-10-CM) - Unilateral  primary osteoarthritis, left hip  THERAPY DIAG:  Pain in right hip  Pain in left hip  Muscle weakness (generalized)  Difficulty in walking, not elsewhere classified  Rationale for Evaluation and Treatment: Rehabilitation  ONSET DATE: Chronic pain, had Rt THA 2018.  SUBJECTIVE:   SUBJECTIVE STATEMENT: She had bad night with pain and unable to sleep. Had some abdominal pain and thinks it may have been one of the machines.  PERTINENT HISTORY: Anx, insomnia, DM, bilat TKA, Rt THA 2018, OA  PAIN:  NPRS scale: 4/10 upon arrival Pain location:Rt anterior/lateral proximal hip and left posterior hip Pain description: constant, achy,  sharp Aggravating factors: walking, getting in/out of car, stairs, standing more than 10 minutes, housework Relieving factors: heat, rest   PRECAUTIONS: None  RED FLAGS: None   WEIGHT BEARING RESTRICTIONS: No  FALLS:  Has patient fallen in last 6 months? No   PLOF: Independent with basic ADLs  PATIENT GOALS: reduce pain, improve walking and function   OBJECTIVE:  Note: Objective measures were completed at Evaluation unless otherwise noted.  DIAGNOSTIC FINDINGS:  MRI IMPRESSION 04/29/23: 1. Right total hip arthroplasty without periarticular fluid collection or osteolysis. No hardware failure or complication. 2. Severe osteoarthritis of the left hip. Moderate left hip joint effusion. 3. Small partial-thickness tear of the hamstring origin bilaterally.  PATIENT SURVEYS:  Eval: FOTO 33% functional, goal is 50%  COGNITION: Overall cognitive status: Within functional limits for tasks assessed     SENSATION: WFL  MUSCLE LENGTH: Tight ITB, hip flexors, quads bilat   LOWER EXTREMITY ROM:  Active ROM Right eval Left eval  Hip flexion 80 50  Hip extension 10 10  Hip abduction 25 25  Hip adduction    Hip internal rotation 25 10  Hip external rotation 15 25  Knee flexion    Knee extension    Ankle dorsiflexion    Ankle  plantarflexion    Ankle inversion    Ankle eversion     (Blank rows = not tested)  LOWER EXTREMITY MMT:  MMT Right eval Left eval  Hip flexion 4 2-  Hip extension 3+ 3+  Hip abduction 3+ 3+  Hip adduction 4 4  Hip internal rotation 4 4  Hip external rotation 4 4  Knee flexion 5 5  Knee extension 5 5  Ankle dorsiflexion    Ankle plantarflexion    Ankle inversion    Ankle eversion     (Blank rows = not tested)  FUNCTIONAL TESTS:  Eval 5 times sit to stand: 20;56 no UE support Timed up and go (TUG): 16:47 with SPC  GAIT: Distance walked: 100 feet Assistive device utilized: Single point cane Level of assistance: Modified independence TODAY'S TREATMENT:  11/08/23 Nu step L5 X 10  min UE/LE, seat #5 Slantboard stretch 30 sec X 3 Low trunk rotations 10 sec X 5 bilat Supine hip flexor/quad stretch 20 se X 3 Hip adduction isometric ball squeeze 5 sec X 10 Supine clams red X15 Seated SLR 2 X 10 bilat Standing hip extensions alternating X 12 bilat Standing alternating marches X 12 bilat Standing alternating hip abductions X 12 bilat   11/03/23 Nu step L5 X 8  min UE/LE, seat #5 Slantboard stretch 30 sec X 3 Leg press machine 50# DL [id number], SL 74# K89 each side Low trunk rotations 5 sec X 10 bilat HEP review Supine ITB stretch 20 sec X 3 bilat Supine hip flexor/quad stretch 20 se X 3 Seated SLR X 10 bilat Standing alternating marches X 10 bilat Standing alternating hip abductions X 10 bilat    PATIENT EDUCATION: Education details: HEP, PT plan of care Person educated: Patient Education method: Explanation, Demonstration, Verbal cues, and Handouts Education comprehension: verbalized understanding and needs further education   HOME EXERCISE PROGRAM: Access Code: [email or web address] URL: [email or web address] Date: 10/13/2023 Prepared by: Redell Moose  Exercises - Supine ITB Stretch with Strap  - 2 x daily - 6 x weekly - 2-3 reps - 30 sec hold - Supine  Quadriceps Stretch with Strap on Table  - 2 x daily - 6 x weekly - 3 reps - 30 sec hold -  alternating marching  - 2 x daily - 6 x weekly - 1-2 sets - 10 reps - Standing Hip Abduction with Counter Support  - 2 x daily - 6 x weekly - 1-2 sets - 10 reps - Sit to Stand Without Arm Support  - 2 x daily - 6 x weekly - 1-2 sets - 10 reps - Seated Straight Leg Raise with Quad Contraction  - 2 x daily - 6 x weekly - 1-2 sets - 10 reps  ASSESSMENT:  CLINICAL IMPRESSION: Leg press machine held today as she feels it may have caused some pain last time. Continued to work on exercises for strength and ROM of hips within her pain tolerance. PT recommending to continue with current PT plan. Short term PT goals now met but long term goals ongoing.   OBJECTIVE IMPAIRMENTS: decreased activity tolerance, difficulty walking, decreased balance, decreased endurance, decreased mobility, decreased ROM, decreased strength, impaired flexibility, impaired LE use, postural dysfunction, and pain.  ACTIVITY LIMITATIONS: bending, lifting, carry, locomotion, cleaning, community activity,  PERSONAL FACTORS: see above PMH are also affecting patient's functional outcome.  REHAB POTENTIAL: Good  CLINICAL DECISION MAKING: Stable/uncomplicated  EVALUATION COMPLEXITY: Low    GOALS: Short term PT Goals Target date: 11/10/2023   Pt will be I and compliant with HEP. Baseline:  Goal status: MET 11/08/23 Pt will decrease pain by 25% overall Baseline: Goal status: MET 11/08/23  Long term PT goals Target date:11/24/2023   Pt will improve hip AROM to [facility] to improve functional mobility Baseline: Goal status: ongoing 11/08/23 Pt will improve  hip/knee strength to at least 4+/5 MMT to improve functional strength Baseline: Goal status: ongoing 11/08/23 Pt will improve FOTO to at least 50% functional to show improved function Baseline: Goal status: ongoing 11/08/23 Pt will reduce pain to overall less than 4/10 with usual activity and  housework Baseline:8 Goal status: ongoing 11/08/23  PLAN: PT FREQUENCY: 1-2 times per week   PT DURATION: 4-6 weeks  PLANNED INTERVENTIONS (unless contraindicated): aquatic PT, Canalith repositioning, cryotherapy, Electrical stimulation, Iontophoresis with 4 mg/ml dexamethasome, Moist heat, traction, Ultrasound, gait training, Therapeutic exercise, balance training, neuromuscular re-education, patient/family education,  manual techniques, passive ROM, dry needling, taping, vasopnuematic device, vestibular, spinal manipulations, joint manipulations [id number]-Therapeutic exercises, 97530- Therapeutic activity, 97112- Neuromuscular re-education, 97535- Self Care, and [id number]- Manual therapy  PLAN FOR NEXT SESSION: No leg press.  general and gentle hip strength and ROM as tolerated, gait, balance. Continue PT for prehab also until planned THA 12/05/22    Redell JONELLE Moose, PT,DPT 11/08/2023, 11:02 AM

## 2023-11-10 ENCOUNTER — Encounter: Payer: Self-pay | Admitting: Physical Therapy

## 2023-11-10 ENCOUNTER — Ambulatory Visit: Payer: PPO | Admitting: Physical Therapy

## 2023-11-10 DIAGNOSIS — M6281 Muscle weakness (generalized): Secondary | ICD-10-CM

## 2023-11-10 DIAGNOSIS — R262 Difficulty in walking, not elsewhere classified: Secondary | ICD-10-CM

## 2023-11-10 DIAGNOSIS — M25552 Pain in left hip: Secondary | ICD-10-CM

## 2023-11-10 DIAGNOSIS — M25551 Pain in right hip: Secondary | ICD-10-CM | POA: Diagnosis not present

## 2023-11-10 NOTE — Therapy (Signed)
 OUTPATIENT PHYSICAL THERAPY TREATMENT  Patient Name: Lauren [name] MRN: [id number] DOB:Sep 01, 1946, 78 y.o., female Today's Date: 11/10/2023  END OF SESSION:  PT End of Session - 11/10/23 1047     Visit Number 4    Number of Visits 8    Date for PT Re-Evaluation 11/24/23    PT Start Time 1015    PT Stop Time 1056    PT Time Calculation (min) 41 min    Activity Tolerance Patient tolerated treatment well    Behavior During Therapy [facility] for tasks assessed/performed               Past Medical History:  Diagnosis Date   Anxiety    Arthritis    Back, Hip- right   Atypical chest pain 06/25/2021   Back pain    Cancer (HCC)    right breast   Cataract    Colitis    Coronary artery calcification 02/20/2020   Diabetes mellitus without complication (HCC)    Type II   Edema 03/05/2021   Edema of both lower extremities    Endometriosis    Family history of adverse reaction to anesthesia    Father - N/V - blood pressure; daughter N/V   Family history of breast cancer    Family history of colon cancer    Family history of kidney cancer    Family history of prostate cancer    Fatty liver    GERD (gastroesophageal reflux disease)    History of hiatal hernia    History of kidney stones    History of ulcerative colitis    Hypertension    Insomnia    Joint pain    Kidney problem    Lower extremity edema 03/05/2021   Lumbar disc disease    L4 L5   Neuropathy    Osteoarthritis    Other hyperlipidemia    Palpitations    Personal history of radiation therapy    Pneumonia    1983, 2003   Pure hypercholesterolemia 02/20/2020   PVCs (premature ventricular contractions)    benign   Umbilical hernia    Vitamin D  deficiency    Wears glasses    Past Surgical History:  Procedure Laterality Date   APPENDECTOMY     BREAST BIOPSY  08-13-2009  DR TSUEI   EXCISION LEFT NIPPLE DUCT   BREAST EXCISIONAL BIOPSY Left    x2   BREAST EXCISIONAL BIOPSY Right    BREAST  LUMPECTOMY Right 11/16/2018   invasive ductal   CHOLECYSTECTOMY  1992   COLON RESECTION  1986   ULCERATIVE COLITIS   COLON SURGERY     ESOPHAGEAL MANOMETRY N/A 06/11/2013   Procedure: ESOPHAGEAL MANOMETRY (EM);  Surgeon: Oliva FORBES Boots, MD;  Location: [facility] ENDOSCOPY;  Service: Endoscopy;  Laterality: N/A;   ESOPHAGOGASTRODUODENOSCOPY (EGD) WITH PROPOFOL  N/A 05/31/2016   Procedure: ESOPHAGOGASTRODUODENOSCOPY (EGD) WITH PROPOFOL ;  Surgeon: Oliva Boots, MD;  Location: [facility] ENDOSCOPY;  Service: Endoscopy;  Laterality: N/A;   EXCISION RIGHT BREAST MASS   10-20-2005  DR MAUDE YOUNG   EYE SURGERY Bilateral 2023   cataract   FRACTURE SURGERY     left arm   kidney stone removed  12/2009   KNEE ARTHROSCOPY Left    MCL   LEFT THUMB CARPOMETACARPAL JOINT SUSPENSIONPLASTY  04-06-2006  DR SISSY   LEFT WRIST ARTHROSCOPY W/ DEBRIDEMENT AND REMOVAL CYST  03-26-2004  DR SISSY   MYOMECTOMY     RADIOACTIVE SEED GUIDED PARTIAL MASTECTOMY WITH AXILLARY SENTINEL LYMPH  NODE BIOPSY Right 11/16/2018   Procedure: RIGHT BREAST RADIOACTIVE SEED GUIDED PARTIAL MASTECTOMY WITH  SENTINEL  NODE BIOPSY;  Surgeon: Vernetta Berg, MD;  Location: [facility] OR;  Service: General;  Laterality: Right;   RIGHT COLECTOMY     RIGHT URETEROSCOPIC STONE EXTRACTION  12-11-2009  DR NORLEEN [facility]   TENDON RELEASE     TONSILLECTOMY     TOTAL HIP ARTHROPLASTY Right 11/26/2016   Procedure: RIGHT TOTAL HIP ARTHROPLASTY ANTERIOR APPROACH;  Surgeon: Lonni CINDERELLA Vernetta, MD;  Location: [facility] ORS;  Service: Orthopedics;  Laterality: Right;   TOTAL KNEE ARTHROPLASTY Left 12/02/2017   Procedure: LEFT TOTAL KNEE ARTHROPLASTY;  Surgeon: Vernetta Lonni CINDERELLA, MD;  Location: [facility] ORS;  Service: Orthopedics;  Laterality: Left;   TOTAL KNEE ARTHROPLASTY Right 08/03/2022   Procedure: RIGHT TOTAL KNEE ARTHROPLASTY;  Surgeon: Vernetta Lonni CINDERELLA, MD;  Location: [facility] OR;  Service: Orthopedics;  Laterality: Right;   WRIST SURGERY     both   Patient  Active Problem List   Diagnosis Date Noted   Restless leg syndrome 09/22/2023   Other hyperlipidemia 06/08/2023   Unilateral primary osteoarthritis, left hip 05/11/2023   Depression 03/16/2023   Allergic rhinitis 12/29/2022   Seasonal affective disorder (HCC) 12/01/2022   BMI 33.0-33.9,adult 12/01/2022   Obesity, Beginning BMI 37.59 12/01/2022   OA (osteoarthritis) of knee 08/03/2022   Status post total right knee replacement 08/03/2022   Stress 07/19/2022   Other constipation 07/19/2022   Atypical chest pain 06/25/2021   Class 2 severe obesity with serious comorbidity and body mass index (BMI) of 37.0 to 37.9 in adult (HCC) 06/03/2021   Vitamin D  deficiency 06/03/2021   Other fatigue 04/22/2021   SOB (shortness of breath) on exertion 04/22/2021   Lower extremity edema 03/05/2021   Coronary artery calcification 02/20/2020   Pure hypercholesterolemia 02/20/2020   Unilateral primary osteoarthritis, right knee 01/03/2019   Genetic testing 12/07/2018   Family history of breast cancer    Family history of prostate cancer    Family history of colon cancer    Family history of kidney cancer    Malignant neoplasm of upper-outer quadrant of right breast in female, 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/09/2018   Diabetic neuropathy, type II diabetes mellitus (HCC) 11/09/2018   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/02/2017   Trochanteric bursitis, right hip 05/10/2017   Status post total replacement of right hip 11/26/2016   DDD (degenerative disc disease), 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   Low back pain 04/23/2015   Hiatal hernia 04/13/2013   Diabetes mellitus (HCC) 01/26/2013   Umbilical hernia 07/24/2012   Essential hypertension     PCP: Okey Carlin Redbird, MD   REFERRING PROVIDER: Burnetta Brunet, DO   REFERRING DIAG: M70.71 (ICD-10-CM) - Iliopsoas bursitis of right hip Z96.641 (ICD-10-CM) - History of right hip replacement M25.551 (ICD-10-CM) - Pain in right hip M16.12 (ICD-10-CM)  - Unilateral primary osteoarthritis, left hip  THERAPY DIAG:  Pain in right hip  Pain in left hip  Muscle weakness (generalized)  Difficulty in walking, not elsewhere classified  Rationale for Evaluation and Treatment: Rehabilitation  ONSET DATE: Chronic pain, had Rt THA 2018.  SUBJECTIVE:   SUBJECTIVE STATEMENT: 5/10 pain and soreness today, not as bad since we skipped the leg press last time PERTINENT HISTORY: Anx, insomnia, DM, bilat TKA, Rt THA 2018, OA  PAIN:  NPRS scale: 5/10 upon arrival Pain location:Rt anterior/lateral proximal hip and left posterior hip Pain description: constant, achy, sharp Aggravating factors: walking, getting in/out of car,  stairs, standing more than 10 minutes, housework Relieving factors: heat, rest   PRECAUTIONS: None  RED FLAGS: None   WEIGHT BEARING RESTRICTIONS: No  FALLS:  Has patient fallen in last 6 months? No   PLOF: Independent with basic ADLs  PATIENT GOALS: reduce pain, improve walking and function   OBJECTIVE:  Note: Objective measures were completed at Evaluation unless otherwise noted.  DIAGNOSTIC FINDINGS:  MRI IMPRESSION 04/29/23: 1. Right total hip arthroplasty without periarticular fluid collection or osteolysis. No hardware failure or complication. 2. Severe osteoarthritis of the left hip. Moderate left hip joint effusion. 3. Small partial-thickness tear of the hamstring origin bilaterally.  PATIENT SURVEYS:  Eval: FOTO 33% functional, goal is 50%  COGNITION: Overall cognitive status: Within functional limits for tasks assessed     SENSATION: WFL  MUSCLE LENGTH: Tight ITB, hip flexors, quads bilat   LOWER EXTREMITY ROM:  Active ROM Right eval Left eval  Hip flexion 80 50  Hip extension 10 10  Hip abduction 25 25  Hip adduction    Hip internal rotation 25 10  Hip external rotation 15 25  Knee flexion    Knee extension    Ankle dorsiflexion    Ankle plantarflexion    Ankle inversion     Ankle eversion     (Blank rows = not tested)  LOWER EXTREMITY MMT:  MMT Right eval Left eval  Hip flexion 4 2-  Hip extension 3+ 3+  Hip abduction 3+ 3+  Hip adduction 4 4  Hip internal rotation 4 4  Hip external rotation 4 4  Knee flexion 5 5  Knee extension 5 5  Ankle dorsiflexion    Ankle plantarflexion    Ankle inversion    Ankle eversion     (Blank rows = not tested)  FUNCTIONAL TESTS:  Eval 5 times sit to stand: 20;56 no UE support Timed up and go (TUG): 16:47 with SPC  GAIT: Distance walked: 100 feet Assistive device utilized: Single point cane Level of assistance: Modified independence TODAY'S TREATMENT:  11/10/23 Nu step L5 X 10  min UE/LE, seat #5 Slantboard stretch 30 sec X 3 Low trunk rotations 10 sec X 5 bilat Supine hip flexor/quad stretch 20 se X 3 Hip adduction isometric ball squeeze 5 sec X 10 Supine clams red X15 Seated SLR 2 X 10 bilat Standing hip extensions alternating X 10 bilat Standing alternating hip abductions X 10 bilat  11/08/23 Nu step L5 X 10  min UE/LE, seat #5 Slantboard stretch 30 sec X 3 Low trunk rotations 10 sec X 5 bilat Supine hip flexor/quad stretch 20 se X 3 Hip adduction isometric ball squeeze 5 sec X 10 Supine clams red X15 Seated SLR 2 X 10 bilat Standing hip extensions alternating X 12 bilat Standing alternating marches X 12 bilat Standing alternating hip abductions X 12 bilat   11/03/23 Nu step L5 X 8  min UE/LE, seat #5 Slantboard stretch 30 sec X 3 Leg press machine 50# DL [id number], SL 74# K89 each side Low trunk rotations 5 sec X 10 bilat HEP review Supine ITB stretch 20 sec X 3 bilat Supine hip flexor/quad stretch 20 se X 3 Seated SLR X 10 bilat Standing alternating marches X 10 bilat Standing alternating hip abductions X 10 bilat    PATIENT EDUCATION: Education details: HEP, PT plan of care Person educated: Patient Education method: Explanation, Demonstration, Verbal cues, and Handouts Education  comprehension: verbalized understanding and needs further education   HOME EXERCISE PROGRAM:  Access Code: [email or web address] URL: [email or web address] Date: 10/13/2023 Prepared by: Redell Moose  Exercises - Supine ITB Stretch with Strap  - 2 x daily - 6 x weekly - 2-3 reps - 30 sec hold - Supine Quadriceps Stretch with Strap on Table  - 2 x daily - 6 x weekly - 3 reps - 30 sec hold - alternating marching  - 2 x daily - 6 x weekly - 1-2 sets - 10 reps - Standing Hip Abduction with Counter Support  - 2 x daily - 6 x weekly - 1-2 sets - 10 reps - Sit to Stand Without Arm Support  - 2 x daily - 6 x weekly - 1-2 sets - 10 reps - Seated Straight Leg Raise with Quad Contraction  - 2 x daily - 6 x weekly - 1-2 sets - 10 reps  ASSESSMENT:  CLINICAL IMPRESSION: She relays overall doing better but still limited by pain in her hip and has planned surgery 12/06/23. Will continue pre hab until then.   OBJECTIVE IMPAIRMENTS: decreased activity tolerance, difficulty walking, decreased balance, decreased endurance, decreased mobility, decreased ROM, decreased strength, impaired flexibility, impaired LE use, postural dysfunction, and pain.  ACTIVITY LIMITATIONS: bending, lifting, carry, locomotion, cleaning, community activity,  PERSONAL FACTORS: see above PMH are also affecting patient's functional outcome.  REHAB POTENTIAL: Good  CLINICAL DECISION MAKING: Stable/uncomplicated  EVALUATION COMPLEXITY: Low    GOALS: Short term PT Goals Target date: 11/10/2023   Pt will be I and compliant with HEP. Baseline:  Goal status: MET 11/08/23 Pt will decrease pain by 25% overall Baseline: Goal status: MET 11/08/23  Long term PT goals Target date:11/24/2023   Pt will improve hip AROM to [facility] to improve functional mobility Baseline: Goal status: ongoing 11/08/23 Pt will improve  hip/knee strength to at least 4+/5 MMT to improve functional strength Baseline: Goal status: ongoing 11/08/23 Pt will  improve FOTO to at least 50% functional to show improved function Baseline: Goal status: ongoing 11/08/23 Pt will reduce pain to overall less than 4/10 with usual activity and housework Baseline:8 Goal status: ongoing 11/08/23  PLAN: PT FREQUENCY: 1-2 times per week   PT DURATION: 4-6 weeks  PLANNED INTERVENTIONS (unless contraindicated): aquatic PT, Canalith repositioning, cryotherapy, Electrical stimulation, Iontophoresis with 4 mg/ml dexamethasome, Moist heat, traction, Ultrasound, gait training, Therapeutic exercise, balance training, neuromuscular re-education, patient/family education,  manual techniques, passive ROM, dry needling, taping, vasopnuematic device, vestibular, spinal manipulations, joint manipulations 97110-Therapeutic exercises, 97530- Therapeutic activity, 97112- Neuromuscular re-education, 97535- Self Care, and [id number]- Manual therapy  PLAN FOR NEXT SESSION: No leg press.  general and gentle hip strength and ROM as tolerated, gait, balance. Continue PT for prehab also until planned THA 12/05/22    Redell JONELLE Moose, PT,DPT 11/10/2023, 10:52 AM

## 2023-11-15 ENCOUNTER — Encounter: Payer: PPO | Admitting: Physical Therapy

## 2023-11-17 ENCOUNTER — Encounter: Payer: Self-pay | Admitting: Sports Medicine

## 2023-11-17 ENCOUNTER — Other Ambulatory Visit: Payer: Self-pay

## 2023-11-17 ENCOUNTER — Ambulatory Visit: Payer: PPO | Admitting: Sports Medicine

## 2023-11-17 ENCOUNTER — Encounter: Payer: PPO | Admitting: Physical Therapy

## 2023-11-17 DIAGNOSIS — M7061 Trochanteric bursitis, right hip: Secondary | ICD-10-CM | POA: Diagnosis not present

## 2023-11-17 DIAGNOSIS — S76011D Strain of muscle, fascia and tendon of right hip, subsequent encounter: Secondary | ICD-10-CM | POA: Diagnosis not present

## 2023-11-17 DIAGNOSIS — M1612 Unilateral primary osteoarthritis, left hip: Secondary | ICD-10-CM | POA: Diagnosis not present

## 2023-11-17 DIAGNOSIS — E1169 Type 2 diabetes mellitus with other specified complication: Secondary | ICD-10-CM | POA: Diagnosis not present

## 2023-11-17 MED ORDER — BETAMETHASONE SOD PHOS & ACET 6 (3-3) MG/ML IJ SUSP
6.0000 mg | INTRAMUSCULAR | Status: AC | PRN
  Administered 2023-11-17: 6 mg via INTRA_ARTICULAR

## 2023-11-17 MED ORDER — BUPIVACAINE HCL 0.25 % IJ SOLN
2.0000 mL | INTRAMUSCULAR | Status: AC | PRN
  Administered 2023-11-17: 2 mL via INTRA_ARTICULAR

## 2023-11-17 MED ORDER — LIDOCAINE HCL 1 % IJ SOLN
2.0000 mL | INTRAMUSCULAR | Status: AC | PRN
  Administered 2023-11-17: 2 mL

## 2023-11-17 NOTE — Progress Notes (Signed)
Patient says that her surgery for her left side is in about 3 weeks. She has had a lot of pain in the right side; she is tender on the side of the hip and has pain in the front as well. She has trouble sleeping on the right side due to pain on the right side of the hip. She is inquiring about the possibility of injection into the bursa or other options that might help with pain prior to surgery.

## 2023-11-17 NOTE — Progress Notes (Signed)
Lauren [name] - 78 y.o. female MRN [id number]  Date of birth: [birth year]  Office Visit Note: Visit Date: 11/17/2023 PCP: Daisy Floro, MD Referred by: Daisy Floro, MD  Subjective: Chief Complaint  Patient presents with   Right Hip - Follow-up   HPI: Lauren [name] is a very pleasant 78 y.o. female who presents today for follow-up of right anterior and lateral hip pain in setting of prior R-hip THA.  She has a planned left hip replacement with Dr. Magnus Ivan upcoming in about 3 weeks. She has been performing physical therapy for prehab for the left hip as well as rehab for her right hip.  We had done a few extracorporeal shockwave treatments in the past which she has received good relief from. Uses her chronic hydrocodone/acetaminophen 10-325 mg 3 times daily for pain control.   She is a type-ii diabetic, but very well controlled. Lab Results  Component Value Date   HGBA1C 6.0 (H) 06/08/2023   Pertinent ROS were reviewed with the patient and found to be negative unless otherwise specified above in HPI.   Assessment & Plan: Visit Diagnoses:  1. Trochanteric bursitis, right hip   2. Tear of right gluteus minimus tendon, subsequent encounter   3. Type 2 diabetes mellitus with other specified complication, without long-term current use of insulin (HCC)   4. 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  She has been undergoing physical therapy which is helping some but her pain is rather significant today.  As we have discussed, her gait is altered secondary to her advanced left hip osteoarthritis and this is preventing normal swing through of each leg.  She does have surgery upcoming for hip replacement in February.  Shared decision making, we did proceed with an ultrasound-guided  right greater trochanteric injection for pain relief.  She will have 40 hours of modified rest/activity, may use ice/heat for any pain.  Following 48 hours, she may return to her PT/home therapy. Continue her chronic hydrocodone-acetaminophen 10-325 mg 3 times daily for pain control.  She will update me after she completes her L-hip replacement and has done 3-4 weeks of rehab to see how the right hip is doing as well.  If for some reason she is not improving with injection and therapy in terms of her strength, she could be a candidate for gluteus maximus transfer for her glute minimus/medius tear, but I am hopeful this is not needed once her hips are balanced and finished with PT.   Follow-up: Return if symptoms worsen or fail to improve.   Meds & Orders: No orders of the defined types were placed in this encounter.   Orders Placed This Encounter  Procedures   Large Joint Inj: R greater trochanter   US Guided Needle Placement - No Linked Charges     Procedures: Large Joint Inj: R greater trochanter on 11/17/2023 9:13 AM Indications: pain Details: 22 G 3.5 in needle, ultrasound-guided lateral approach Medications: 2 mL lidocaine 1 %; 2 mL bupivacaine 0.25 %; 6 mg betamethasone acetate-betamethasone sodium phosphate 6 (3-3) MG/ML Outcome: tolerated well, no immediate complications  US-Guided Greater Trochanteric Bursa Injection, Right After discussion on risks/benefits/indications and informed verbal consent was obtained, a timeout was performed. The patient was lying in lateral recumbent position on exam table. Using ultrasound  guidance, the greater trochanter was identified. The area overlying the trochanteric bursa was then prepped with Betadine and alcohol swabs. Following sterile precautions, ultrasound was reapplied to visualize needle guidance with a 22-gauge 3.5" needle utilizing an in-plane approach to inject the bursa with 2:2:1 lidocaine:bupivicaine:betamethasone. Delivery of the  injectate was visualized into the region of hypoechoic fluid of the greater trochanteric bursa. Patient tolerated procedure well without immediate complications.    Procedure, treatment alternatives, risks and benefits explained, specific risks discussed. Consent was given by the patient. Immediately prior to procedure a time out was called to verify the correct patient, procedure, equipment, support staff and site/side marked as required. Patient was prepped and draped in the usual sterile fashion.          - 30cc of IM Toradol administered into the R-buttock  Clinical History: No specialty comments available.  She reports that she quit smoking about 41 years ago. Her smoking use included cigarettes. She started smoking about 51 years ago. She has a 10 pack-year smoking history. She has never used smokeless tobacco.  Recent Labs    03/23/23 1012 06/08/23 0941  HGBA1C 6.5* 6.0*    Objective:    Physical Exam  Gen: Well-appearing, in no acute distress; non-toxic CV: Well-perfused. Warm.  Resp: Breathing unlabored on room air; no wheezing. Psych: Fluid speech in conversation; appropriate affect; normal thought process  Ortho Exam - Right & Left hip: + TTP over the greater trochanter and just posterior to this and has a gluteal insertion.  There is hip weakness with 3/5 strength with hip abduction, there is mild discomfort with hip flexion although no gross weakness.  There is an antalgic gait, the left hip has bony restriction with all planes of motion.  Imaging:  Narrative & Impression  CLINICAL DATA:  Right hip replacement. Chronic left hip pain. Right hip pain for 4 months.   EXAM: MR OF THE RIGHT HIP WITHOUT CONTRAST   MR OF THE LEFT HIP WITHOUT CONTRAST   TECHNIQUE: Multiplanar, multisequence MR imaging of the right hip was performed. No intravenous contrast was administered.   Multiplanar, multisequence MR imaging of the left hip was performed. No intravenous  contrast was administered.   COMPARISON:  None Available.   FINDINGS: Bones:   Right total hip arthroplasty with susceptibility artifact partially obscuring the adjacent soft tissue and osseous structures. No periarticular fluid collection or osteolysis. No acute fracture or dislocation.   No left hip fracture, dislocation or avascular necrosis.   No periosteal reaction or bone destruction. No aggressive osseous lesion.   Normal sacrum and sacroiliac joints. No SI joint widening or erosive changes.   Articular cartilage and labrum   Articular cartilage: High-grade partial-thickness cartilage loss with areas of full-thickness cartilage loss of the left femoral head and acetabulum and subchondral reactive marrow edema in the left femoral head.   Labrum:  Right anterior labral tear.   Joint or bursal effusion   Joint effusion: Moderate left hip joint effusion. No SI joint effusion.   Bursae:  No bursal fluid.   Muscles and tendons   Flexors: Normal.   Extensors: Normal.   Abductors: Normal.   Adductors: Normal.   Gluteals: Atrophy of the left gluteus minimus muscle. Chronic tear of the left gluteus minimus tendon insertion.   Hamstrings: Small partial-thickness tear of the hamstring origin bilaterally.   Other findings   No pelvic free fluid. No fluid collection or hematoma. No inguinal lymphadenopathy. No inguinal hernia.   IMPRESSION: 1.  Right total hip arthroplasty without periarticular fluid collection or osteolysis. No hardware failure or complication. 2. Severe osteoarthritis of the left hip. Moderate left hip joint effusion. 3. Small partial-thickness tear of the hamstring origin bilaterally.     Electronically Signed   By: Elige Ko M.D.   On: 05/06/2023 10:55   Narrative & Impression  CLINICAL DATA:  Right hip replacement. Chronic left hip pain. Right hip pain for 4 months.   EXAM: MR OF THE RIGHT HIP WITHOUT CONTRAST   MR OF THE  LEFT HIP WITHOUT CONTRAST   TECHNIQUE: Multiplanar, multisequence MR imaging of the right hip was performed. No intravenous contrast was administered.   Multiplanar, multisequence MR imaging of the left hip was performed. No intravenous contrast was administered.   COMPARISON:  None Available.   FINDINGS: Bones:   Right total hip arthroplasty with susceptibility artifact partially obscuring the adjacent soft tissue and osseous structures. No periarticular fluid collection or osteolysis. No acute fracture or dislocation.   No left hip fracture, dislocation or avascular necrosis.   No periosteal reaction or bone destruction. No aggressive osseous lesion.   Normal sacrum and sacroiliac joints. No SI joint widening or erosive changes.   Articular cartilage and labrum   Articular cartilage: High-grade partial-thickness cartilage loss with areas of full-thickness cartilage loss of the left femoral head and acetabulum and subchondral reactive marrow edema in the left femoral head.   Labrum:  Right anterior labral tear.   Joint or bursal effusion   Joint effusion: Moderate left hip joint effusion. No SI joint effusion.   Bursae:  No bursal fluid.   Muscles and tendons   Flexors: Normal.   Extensors: Normal.   Abductors: Normal.   Adductors: Normal.   Gluteals: Atrophy of the left gluteus minimus muscle. Chronic tear of the left gluteus minimus tendon insertion.   Hamstrings: Small partial-thickness tear of the hamstring origin bilaterally.   Other findings   No pelvic free fluid. No fluid collection or hematoma. No inguinal lymphadenopathy. No inguinal hernia.   IMPRESSION: 1. Right total hip arthroplasty without periarticular fluid collection or osteolysis. No hardware failure or complication. 2. Severe osteoarthritis of the left hip. Moderate left hip joint effusion. 3. Small partial-thickness tear of the hamstring origin bilaterally.      Electronically Signed   By: Elige Ko M.D.   On: 05/06/2023 10:55   Past Medical/Family/Surgical/Social History: Medications & Allergies reviewed per EMR, new medications updated. 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/03/2022   Stress 07/19/2022   Other constipation 07/19/2022   Atypical chest pain 06/25/2021   Class 2 severe obesity with serious comorbidity and body mass index (BMI) of 37.0 to 37.9 in adult (HCC) 06/03/2021   Vitamin D deficiency 06/03/2021   Other fatigue 04/22/2021   SOB (shortness of breath) on exertion 04/22/2021   Lower extremity edema 03/05/2021   Coronary artery calcification 02/20/2020   Pure hypercholesterolemia 02/20/2020   Unilateral primary osteoarthritis, right knee 01/03/2019   Genetic testing 12/07/2018   Family history of breast cancer    Family history of prostate cancer    Family  history of colon cancer    Family history of kidney cancer    Malignant neoplasm of upper-outer quadrant of right breast in female, 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/09/2018   Diabetic neuropathy, type II diabetes mellitus (HCC) 11/09/2018   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/02/2017   Trochanteric bursitis, right hip 05/10/2017   Status post total replacement of right hip 11/26/2016   DDD (degenerative disc disease), 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   Low back pain 04/23/2015   Hiatal hernia 04/13/2013   Diabetes mellitus (HCC) 01/26/2013   Umbilical hernia 07/24/2012   Essential hypertension    Past Medical History:  Diagnosis Date   Anxiety    Arthritis     Back, Hip- right   Atypical chest pain 06/25/2021   Back pain    Cancer (HCC)    right breast   Cataract    Colitis    Coronary artery calcification 02/20/2020   Diabetes mellitus without complication (HCC)    Type II   Edema 03/05/2021   Edema of both lower extremities    Endometriosis    Family history of adverse reaction to anesthesia    Father - N/V - blood pressure; daughter N/V   Family history of breast cancer    Family history of colon cancer    Family history of kidney cancer    Family history of prostate cancer    Fatty liver    GERD (gastroesophageal reflux disease)    History of hiatal hernia    History of kidney stones    History of ulcerative colitis    Hypertension    Insomnia    Joint pain    Kidney problem    Lower extremity edema 03/05/2021   Lumbar disc disease    L4 L5   Neuropathy    Osteoarthritis    Other hyperlipidemia    Palpitations    Personal history of radiation therapy    Pneumonia    1983, 2003   Pure hypercholesterolemia 02/20/2020   PVCs (premature ventricular contractions)    benign   Umbilical hernia    Vitamin D deficiency    Wears glasses    Family History  Problem Relation Age of Onset   Hyperlipidemia Mother    Diabetes Mother    Hypertension Mother    Parkinson's disease Mother    Kidney disease Mother    Kidney disease Father    Stroke Father    Heart failure Father    Hypertension Father    Heart disease Father    Diabetes Father    Cancer Father        colon, prostate, and kidney   Heart disease Maternal Grandmother    Diabetes Maternal Grandmother    Heart disease Maternal Grandfather    Diabetes Maternal Grandfather    Cancer Maternal Grandfather        pt unaware of what kind   Stroke Paternal Grandmother    Stroke Paternal Grandfather    Cancer Maternal Aunt        cervical vs ovarian   Colon cancer Paternal Aunt        dx less than 50   Breast cancer Paternal Aunt        dx in her 4s    Prostate cancer Paternal Uncle    Breast cancer Cousin        pat first cousin, dx over 71   Prostate cancer Other        PGF's father   Past  Surgical History:  Procedure Laterality Date   APPENDECTOMY     BREAST BIOPSY  08-13-2009  DR TSUEI   EXCISION LEFT NIPPLE DUCT   BREAST EXCISIONAL BIOPSY Left    x2   BREAST EXCISIONAL BIOPSY Right    BREAST LUMPECTOMY Right 11/16/2018   invasive ductal   CHOLECYSTECTOMY  1992   COLON RESECTION  1986   ULCERATIVE COLITIS   COLON SURGERY     ESOPHAGEAL MANOMETRY N/A 06/11/2013   Procedure: ESOPHAGEAL MANOMETRY (EM);  Surgeon: Petra Kuba, MD;  Location: [facility];  Service: Endoscopy;  Laterality: N/A;   ESOPHAGOGASTRODUODENOSCOPY (EGD) WITH PROPOFOL N/A 05/31/2016   Procedure: ESOPHAGOGASTRODUODENOSCOPY (EGD) WITH PROPOFOL;  Surgeon: Vida Rigger, MD;  Location: [facility];  Service: Endoscopy;  Laterality: N/A;   EXCISION RIGHT BREAST MASS   10-20-2005  DR Theron Arista YOUNG   EYE SURGERY Bilateral 2023   cataract   FRACTURE SURGERY     left arm   kidney stone removed  12/2009   KNEE ARTHROSCOPY Left    MCL   LEFT THUMB CARPOMETACARPAL JOINT SUSPENSIONPLASTY  04-06-2006  DR Mina Marble   LEFT WRIST ARTHROSCOPY W/ DEBRIDEMENT AND REMOVAL CYST  03-26-2004  DR Mina Marble   MYOMECTOMY     RADIOACTIVE SEED GUIDED PARTIAL MASTECTOMY WITH AXILLARY SENTINEL LYMPH NODE BIOPSY Right 11/16/2018   Procedure: RIGHT BREAST RADIOACTIVE SEED GUIDED PARTIAL MASTECTOMY WITH  SENTINEL  NODE BIOPSY;  Surgeon: Abigail Miyamoto, MD;  Location: [facility] OR;  Service: General;  Laterality: Right;   RIGHT COLECTOMY     RIGHT URETEROSCOPIC STONE EXTRACTION  12-11-2009  DR Jonny Ruiz [facility]   TENDON RELEASE     TONSILLECTOMY     TOTAL HIP ARTHROPLASTY Right 11/26/2016   Procedure: RIGHT TOTAL HIP ARTHROPLASTY ANTERIOR APPROACH;  Surgeon: Kathryne Hitch, MD;  Location: [facility];  Service: Orthopedics;  Laterality: Right;   TOTAL KNEE ARTHROPLASTY Left 12/02/2017    Procedure: LEFT TOTAL KNEE ARTHROPLASTY;  Surgeon: Kathryne Hitch, MD;  Location: [facility];  Service: Orthopedics;  Laterality: Left;   TOTAL KNEE ARTHROPLASTY Right 08/03/2022   Procedure: RIGHT TOTAL KNEE ARTHROPLASTY;  Surgeon: Kathryne Hitch, MD;  Location: [facility] OR;  Service: Orthopedics;  Laterality: Right;   WRIST SURGERY     both   Social History   Occupational History   Occupation: Retired  Tobacco Use   Smoking status: Former    Current packs/day: 0.00    Average packs/day: 1 pack/day for 10.0 years (10.0 ttl pk-yrs)    Types: Cigarettes    Start date: 09/22/1972    Quit date: 09/22/1982    Years since quitting: 41.1   Smokeless tobacco: Never   Tobacco comments:    quit 1983  Vaping Use   Vaping status: Never Used  Substance and Sexual Activity   Alcohol use: No   Drug use: No   Sexual activity: Yes    Partners: Male    Birth control/protection: Post-menopausal

## 2023-11-21 ENCOUNTER — Other Ambulatory Visit: Payer: Self-pay

## 2023-11-22 ENCOUNTER — Encounter (INDEPENDENT_AMBULATORY_CARE_PROVIDER_SITE_OTHER): Payer: Self-pay | Admitting: Family Medicine

## 2023-11-22 ENCOUNTER — Ambulatory Visit (INDEPENDENT_AMBULATORY_CARE_PROVIDER_SITE_OTHER): Payer: PPO | Admitting: Family Medicine

## 2023-11-22 VITALS — BP 122/74 | HR 64 | Ht 64.0 in | Wt 187.0 lb

## 2023-11-22 DIAGNOSIS — F3289 Other specified depressive episodes: Secondary | ICD-10-CM

## 2023-11-22 DIAGNOSIS — Z7985 Long-term (current) use of injectable non-insulin antidiabetic drugs: Secondary | ICD-10-CM

## 2023-11-22 DIAGNOSIS — M1612 Unilateral primary osteoarthritis, left hip: Secondary | ICD-10-CM | POA: Diagnosis not present

## 2023-11-22 DIAGNOSIS — Z6832 Body mass index (BMI) 32.0-32.9, adult: Secondary | ICD-10-CM

## 2023-11-22 DIAGNOSIS — E119 Type 2 diabetes mellitus without complications: Secondary | ICD-10-CM | POA: Diagnosis not present

## 2023-11-22 DIAGNOSIS — E1140 Type 2 diabetes mellitus with diabetic neuropathy, unspecified: Secondary | ICD-10-CM

## 2023-11-22 DIAGNOSIS — F5089 Other specified eating disorder: Secondary | ICD-10-CM

## 2023-11-22 DIAGNOSIS — E669 Obesity, unspecified: Secondary | ICD-10-CM

## 2023-11-22 DIAGNOSIS — N393 Stress incontinence (female) (male): Secondary | ICD-10-CM | POA: Insufficient documentation

## 2023-11-22 MED ORDER — TIRZEPATIDE 5 MG/0.5ML ~~LOC~~ SOAJ: 5.0000 mg | SUBCUTANEOUS | 0 refills | Status: DC

## 2023-11-22 MED ORDER — ESCITALOPRAM OXALATE 20 MG PO TABS: 20.0000 mg | ORAL_TABLET | Freq: Every day | ORAL | 0 refills | Status: DC

## 2023-11-22 NOTE — Progress Notes (Signed)
.smr  Office: [phone]  /  Fax: [phone]  WEIGHT SUMMARY AND BIOMETRICS  Anthropometric Measurements Height: 5\' 4"  (1.626 m) Weight: 187 lb (84.8 kg) BMI (Calculated): 32.08 Weight at Last Visit: 194 lb Weight Lost Since Last Visit: 7 lb Weight Gained Since Last Visit: 0 Total Weight Loss (lbs): 32 lb (14.5 kg)   Body Composition  Body Fat %: 48.3 % Fat Mass (lbs): 90.6 lbs Muscle Mass (lbs): 92 lbs Total Body Water (lbs): 70.6 lbs Visceral Fat Rating : 15   Other Clinical Data Fasting: No Today's Visit #: 32    Chief Complaint: OBESITY    History of Present Illness   The patient, with a history of obesity, type 2 diabetes, and emotional eating behaviors, has been managing her weight loss efforts successfully, losing seven pounds over the past two months. She reports adherence to a category two eating plan, although she admits to occasional indulgence in candy. She is not currently engaging in any exercise regimen. Her diabetes is managed with Mounjaro, and emotional eating behaviors are addressed with Lexapro. She requests a refill for both medications.  The patient also reports upcoming surgery for a left hip replacement, scheduled in two weeks. This is not her first hip replacement, as she has previously undergone replacement of the other hip and both knees. She has been attending physical therapy to strengthen her body in preparation for the surgery. She reports significant pain in her left leg, which hinders her mobility, particularly when getting in and out of her car. She also mentions a torn area in the gluteus minimus, which may be contributing to her discomfort.  In addition to her musculoskeletal issues, the patient reports problems with stress incontinence, which has been worsening. She has been managing the condition with pads but expresses dissatisfaction with this solution. She has not yet seen a urologist for this issue but expresses a desire to do so  after her upcoming surgery. She has been advised to consider surgery for this condition as well.  The patient's overall mood appears positive, with plans for future activities such as gardening, indicating a forward-looking mindset despite her current health challenges. She expresses some concern about her upcoming surgery and the subsequent recovery period, particularly regarding the care of her two dogs during her recovery stay at her daughter's house.          PHYSICAL EXAM:  Blood pressure 122/74, pulse 64, height 5\' 4"  (1.626 m), weight 187 lb (84.8 kg), SpO2 98%. Body mass index is 32.1 kg/m.  DIAGNOSTIC DATA REVIEWED:  BMET    Component Value Date/Time   NA 132 (L) 06/08/2023 0941   K 3.7 06/08/2023 0941   CL 91 (L) 06/08/2023 0941   CO2 24 06/08/2023 0941   GLUCOSE 89 06/08/2023 0941   GLUCOSE 127 (H) 08/04/2022 0331   BUN 14 06/08/2023 0941   CREATININE 0.91 06/08/2023 0941   CREATININE 0.87 06/08/2022 1439   CALCIUM 9.5 06/08/2023 0941   GFRNONAA 52 (L) 08/04/2022 0331   GFRNONAA >60 06/08/2022 1439   GFRAA >60 02/04/2020 1212   GFRAA >60 11/09/2018 1405   Lab Results  Component Value Date   HGBA1C 6.0 (H) 06/08/2023   HGBA1C 6.7 (H) 11/16/2016   Lab Results  Component Value Date   INSULIN 25.6 (H) 06/08/2023   INSULIN 22.3 04/22/2021   Lab Results  Component Value Date   TSH 2.150 05/11/2022   CBC    Component Value Date/Time   WBC 7.8 08/04/2022  0331   RBC 3.14 (L) 08/04/2022 0331   HGB 9.5 (L) 08/04/2022 0331   HGB 12.3 06/08/2022 1439   HGB 11.8 05/11/2022 0954   HCT 27.9 (L) 08/04/2022 0331   HCT 35.1 05/11/2022 0954   PLT 205 08/04/2022 0331   PLT 272 06/08/2022 1439   PLT 265 05/11/2022 0954   MCV 88.9 08/04/2022 0331   MCV 87 05/11/2022 0954   MCH 30.3 08/04/2022 0331   MCHC 34.1 08/04/2022 0331   RDW 13.6 08/04/2022 0331   RDW 13.2 05/11/2022 0954   Iron Studies No results found for: "IRON", "TIBC", "FERRITIN", "IRONPCTSAT" Lipid  Panel     Component Value Date/Time   CHOL 123 06/08/2023 0941   TRIG 75 06/08/2023 0941   HDL 67 06/08/2023 0941   LDLCALC 41 06/08/2023 0941   Hepatic Function Panel     Component Value Date/Time   PROT 6.7 06/08/2023 0941   ALBUMIN 4.3 06/08/2023 0941   AST 19 06/08/2023 0941   AST 16 06/08/2022 1439   ALT 16 06/08/2023 0941   ALT 12 06/08/2022 1439   ALKPHOS 58 06/08/2023 0941   BILITOT 0.4 06/08/2023 0941   BILITOT 0.4 06/08/2022 1439   BILIDIR <0.1 (L) 07/30/2015 1338   IBILI NOT CALCULATED 07/30/2015 1338      Component Value Date/Time   TSH 2.150 05/11/2022 0954   Nutritional Lab Results  Component Value Date   VD25OH 124.0 (H) 09/22/2023   VD25OH 100.0 06/08/2023   VD25OH 55.8 12/01/2022     Assessment and Plan    Left Hip Osteoarthritis   Scheduled for left hip replacement surgery in two weeks. Experiencing significant pain and mobility issues, including difficulty getting in and out of the car. Undergoing physical therapy to strengthen the leg pre-surgery. Received Toradol injection for pain management. Torn gluteus minimus noted; potential future repair discussed if symptoms persist.   - Proceed with scheduled left hip replacement surgery.   - Continue physical therapy as prescribed.   - Follow up with orthopedic surgeon post-surgery for light PT and further evaluation.    Obesity   Actively working on weight loss, lost seven pounds over the last two months. Following a category two eating plan 50-60% of the time, not exercising. Plans to increase protein intake before surgery.   - Continue with the current eating plan.   - Encourage increased protein intake, especially before surgery.   - Discuss portion control and healthy food choices.    Type 2 Diabetes Mellitus   Currently on Mounjaro for management. Instructed to stop Mounjaro one week before surgery to avoid complications related to slowed GI tract motility. Discussed restarting Mounjaro ten days  post-surgery.   - Refill Mounjaro prescription, instructing to take every ten days.   - Stop Mounjaro one week before surgery.   - Restart Mounjaro ten days after surgery.    Emotional Eating Behavior   On Lexapro for emotional eating behaviors, reports occasional sneaking of candy. Using healthier alternatives like yogurt bars and sugar-free pudding.   - Refill Lexapro prescription for 90 days.    Stress Incontinence   Reports stress incontinence with coughing, sneezing, and physical activity. Using thin pads, finds them insufficient at times. Considering surgical options. Discussed referral to a urogynecologist for further evaluation and potential surgical intervention.   - Refer to Dr. Lennette Bihari, urogynecologist at [facility] at [facility].   - Consider surgical options if conservative measures are insufficient.  Follow-up   - Schedule follow-up appointment 5-6 weeks post-surgery.         I have personally spent 40 minutes total time today in preparation, patient care, and documentation for this visit, including the following: review of clinical lab tests; review of medical tests/procedures/services.    She was informed of the importance of frequent follow up visits to maximize her success with intensive lifestyle modifications for her multiple health conditions.    [name], MD

## 2023-11-28 ENCOUNTER — Encounter (HOSPITAL_BASED_OUTPATIENT_CLINIC_OR_DEPARTMENT_OTHER): Payer: Self-pay | Admitting: Cardiovascular Disease

## 2023-11-28 ENCOUNTER — Ambulatory Visit (HOSPITAL_BASED_OUTPATIENT_CLINIC_OR_DEPARTMENT_OTHER): Payer: PPO | Admitting: Family

## 2023-11-28 ENCOUNTER — Encounter (HOSPITAL_BASED_OUTPATIENT_CLINIC_OR_DEPARTMENT_OTHER): Payer: Self-pay | Admitting: Family

## 2023-11-28 VITALS — BP 124/76 | HR 64 | Ht 64.5 in | Wt 191.4 lb

## 2023-11-28 DIAGNOSIS — Z01818 Encounter for other preprocedural examination: Secondary | ICD-10-CM | POA: Diagnosis not present

## 2023-11-28 DIAGNOSIS — I10 Essential (primary) hypertension: Secondary | ICD-10-CM | POA: Diagnosis not present

## 2023-11-28 DIAGNOSIS — I25118 Atherosclerotic heart disease of native coronary artery with other forms of angina pectoris: Secondary | ICD-10-CM | POA: Diagnosis not present

## 2023-11-28 DIAGNOSIS — E785 Hyperlipidemia, unspecified: Secondary | ICD-10-CM

## 2023-11-28 NOTE — Patient Instructions (Signed)
Medication Instructions:  No changes  You can hold Aspirin 1 week prior to your surgery  *If you need a refill on your cardiac medications before your next appointment, please call your pharmacy*   Lab Work: NONE  If you have labs (blood work) drawn today and your tests are completely normal, you will receive your results only by: MyChart Message (if you have MyChart) OR A paper copy in the mail If you have any lab test that is abnormal or we need to change your treatment, we will call you to review the results.   Testing/Procedures: NONE    Follow-Up: At [facility], you and your health needs are our priority.  As part of our continuing mission to provide you with exceptional heart care, we have created designated Provider Care Teams.  These Care Teams include your primary Cardiologist (physician) and Advanced Practice Providers (APPs -  Physician Assistants and Nurse Practitioners) who all work together to provide you with the care you need, when you need it.     Your next appointment:   1 year(s)  Provider:   Gillian Shields, NP    Other Instructions

## 2023-11-28 NOTE — Telephone Encounter (Signed)
Called Miss Makawao. Reviewed as nearly 1 year from last OV would require OV prior to providing clearance. She is agreeable to office visit today at 2:45 PM for clearance.  Alver Sorrow, NP

## 2023-11-28 NOTE — Progress Notes (Signed)
Cardiology Office Note:  .   Date:  11/28/2023  ID:  Lauren [name], DOB 1946/06/13, MRN [id number] PCP: Daisy Floro, MD  [facility] Providers Cardiologist:  Chilton Si, MD    History of Present Illness: .   Lauren [name] is a 78 y.o. female with history of coronary artery disease, DM2, HTN, HLD, right breast cancer sp surgery/XRT/chemo.  Myoview 11/2016 LVEF 72% and no ischemia. Myoview 09/2018 LVEF 77%, no ischemia. Coronary CTA 06/2021 mild plaque in LAD, RCA, OM1 (calcium score 460 placing her in 85th percentile).    Presents today for preop clearance for left total hip arthroplasty. Having procedure 12/06/23.  Having severe left sided hip pain but trying to be as active as possible.  Taking chlorthalidone and rosuvastatin as prescribed.  Reports no shortness of breath nor dyspnea on exertion. Reports no chest pain, pressure, or tightness. No edema, orthopnea, PND. Reports no palpitations.   ROS: Please see the history of present illness.    All other systems reviewed and are negative.   Studies Reviewed: Marland Kitchen   EKG Interpretation Date/Time:  Monday November 28 2023 15:01:09 EST Ventricular Rate:  64 PR Interval:  238 QRS Duration:  94 QT Interval:  414 QTC Calculation: 427 R Axis:   -4  Text Interpretation: Sinus rhythm with 1st degree A-V block Incomplete right bundle branch block Confirmed by Gillian Shields ([id number]) on 11/28/2023 3:07:45 PM    Cardiac Studies & Procedures     STRESS TESTS  MYOCARDIAL PERFUSION IMAGING 09/08/2018  Narrative  The left ventricular ejection fraction is hyperdynamic (>65%).  Nuclear stress EF: 77%.  There was no ST segment deviation noted during stress.  The study is normal.  This is a low risk study.  Normal pharmacologic nuclear stress test with no evidence for prior infarct or ischemia.  ECHOCARDIOGRAM  ECHOCARDIOGRAM COMPLETE 03/31/2021  Narrative ECHOCARDIOGRAM REPORT    Patient Name:   Lauren [name] Date of Exam: 03/31/2021 Medical Rec #:  [id number]          Height:       65.0 in Accession #:    [id number]         Weight:       224.4 lb Date of Birth:  14-Jan-1946         BSA:          2.077 m Patient Age:    74 years           BP:           134/62 mmHg Patient Gender: F                  HR:           77 bpm. Exam Location:  [address]  Procedure: 2D Echo, Cardiac Doppler and Color Doppler  Indications:    I10 Hypertension  History:        Patient has no prior history of Echocardiogram examinations. Arrythmias:PVC, Signs/Symptoms:LE edema; Risk Factors:Hypertension, Dyslipidemia, Diabetes and Obesity.  Sonographer:    Samule Ohm RDCS Referring Phys: [id number] [facility] [place]   Sonographer Comments: Technically difficult study due to poor echo windows. IMPRESSIONS   1. Left ventricular ejection fraction, by estimation, is 60 to 65%. The left ventricle has normal function. The left ventricle has no regional wall motion abnormalities. There is mild left ventricular hypertrophy. Left ventricular diastolic parameters are consistent with Grade I diastolic dysfunction (impaired relaxation). Elevated left ventricular end-diastolic pressure. 2.  Right ventricular systolic function is hyperdynamic. The right ventricular size is normal. 3. The mitral valve is grossly normal. Trivial mitral valve regurgitation. 4. The aortic valve is tricuspid. Aortic valve regurgitation is not visualized.  Comparison(s): No prior Echocardiogram.  FINDINGS Left Ventricle: Left ventricular ejection fraction, by estimation, is 60 to 65%. The left ventricle has normal function. The left ventricle has no regional wall motion abnormalities. The left ventricular internal cavity size was normal in size. There is mild left ventricular hypertrophy. Left ventricular diastolic parameters are consistent with Grade I diastolic dysfunction (impaired relaxation). Elevated left ventricular end-diastolic  pressure.  Right Ventricle: The right ventricular size is normal. No increase in right ventricular wall thickness. Right ventricular systolic function is hyperdynamic.  Left Atrium: Left atrial size was normal in size.  Right Atrium: Right atrial size was normal in size.  Pericardium: There is no evidence of pericardial effusion.  Mitral Valve: The mitral valve is grossly normal. Trivial mitral valve regurgitation.  Tricuspid Valve: The tricuspid valve is grossly normal. Tricuspid valve regurgitation is trivial.  Aortic Valve: The aortic valve is tricuspid. Aortic valve regurgitation is not visualized.  Pulmonic Valve: The pulmonic valve was normal in structure. Pulmonic valve regurgitation is not visualized.  Aorta: The aortic root and ascending aorta are structurally normal, with no evidence of dilitation.  IAS/Shunts: No atrial level shunt detected by color flow Doppler.   LEFT VENTRICLE PLAX 2D LVIDd:         4.60 cm  Diastology LVIDs:         3.20 cm  LV e' medial:    4.68 cm/s LV PW:         1.20 cm  LV E/e' medial:  18.6 LV IVS:        1.30 cm  LV e' lateral:   5.77 cm/s LVOT diam:     2.10 cm  LV E/e' lateral: 15.1 LV SV:         84 LV SV Index:   40 LVOT Area:     3.46 cm   RIGHT VENTRICLE             IVC RV S prime:     11.20 cm/s  IVC diam: 1.30 cm TAPSE (M-mode): 1.8 cm RVSP:           24.5 mmHg  LEFT ATRIUM             Index       RIGHT ATRIUM           Index LA diam:        4.20 cm 2.02 cm/m  RA Pressure: 3.00 mmHg LA Vol (A2C):   66.4 ml 31.96 ml/m RA Area:     14.70 cm LA Vol (A4C):   67.4 ml 32.45 ml/m RA Volume:   39.30 ml  18.92 ml/m LA Biplane Vol: 68.4 ml 32.93 ml/m AORTIC VALVE LVOT Vmax:   111.00 cm/s LVOT Vmean:  72.700 cm/s LVOT VTI:    0.242 m  AORTA Ao Root diam: 3.70 cm Ao Asc diam:  3.20 cm  MV E velocity: 86.90 cm/s   TRICUSPID VALVE MV A velocity: 131.00 cm/s  TR Peak grad:   21.5 mmHg MV E/A ratio:  0.66         TR Vmax:         232.00 cm/s Estimated RAP:  3.00 mmHg RVSP:           24.5 mmHg  SHUNTS Systemic VTI:  0.24 m Systemic Diam: 2.10 cm  Zoila Shutter MD Electronically signed by Zoila Shutter MD Signature Date/Time: 03/31/2021/4:03:06 PM    Final    CT SCANS  CT CORONARY MORPH W/CTA COR W/SCORE 07/03/2021  Addendum 07/03/2021  1:09 PM ADDENDUM REPORT: 07/03/2021 13:07  HISTORY: Chest pain/anginal equiv, 55yr CHD risk 10-20%, not treadmill candidate  EXAM: Cardiac/Coronary  CT  TECHNIQUE: The patient was scanned on a [employer].  PROTOCOL: A 120 kV prospective scan was triggered in the descending thoracic aorta at 111 HU's. Axial non-contrast 3 mm slices were carried out through the heart. The data set was analyzed on a dedicated work station and scored using the Agatston method. Gantry rotation speed was 250 msecs and collimation was .6 mm. Beta blockade and 0.8 mg of sl NTG was given. The 3D data set was reconstructed in 5% intervals of the 35-75 % of the R-R cycle. Systolic and diastolic phases were analyzed on a dedicated work station using MPR, MIP and VRT modes. The patient received OMNIPAQUE IOHEXOL 350 MG/ML SOLN contrast.  FINDINGS: Image quality: Average  Noise artifact is: Moderate, decreased signal to noise ratio due to body habitus and mild misregistration.  Coronary calcium score is 460, which places the patient in the 85th percentile for age and sex matched control.  Coronary arteries: Normal coronary origins.  Right dominance.  Right Coronary Artery: Mild mixed atherosclerotic plaque in the mid RCA, 25-49% stenosis.  Left Main Coronary Artery: Minimal mixed atherosclerotic plaque in the ostial and distal LM, <25% stenosis.  Left Anterior Descending Coronary Artery: Mild mixed atherosclerotic plaque in the proximal and mid LAD, 25-49% stenosis.  Left Circumflex Artery: No detectable plaque or stenosis. Mild mixed atherosclerotic plaque in  mid OM1.  Aorta: Normal size, 33 mm at the mid ascending aorta (level of the PA bifurcation) measured double oblique. Minimal calcifications. No dissection.  Aortic Valve: No calcifications.  Tricuspid aortic valve.  Other findings:  Normal pulmonary vein drainage into the left atrium.  Normal left atrial appendage without a thrombus, chicken wing morphology.  Mild dilation of main pulmonary artery, 30 mm may suggest increased pulmonary pressure.  IMPRESSION: 1. Mild CAD in proximal and mild LAD, mid RCA and mid OM1, CADRADS = 2.  2. Coronary calcium score is 460, which places the patient in the 85th percentile for age and sex matched control.  3. Normal coronary origin with right dominance.  4. Mild dilation of main pulmonary artery, 30 mm, may suggest increased pulmonary pressure.   Electronically Signed By: Weston Brass M.D. On: 07/03/2021 13:07  Narrative EXAM: OVER-READ INTERPRETATION  CT CHEST  The following report is an over-read performed by radiologist Dr. Trudie Reed of [facility] on 07/03/2021. This over-read does not include interpretation of cardiac or coronary anatomy or pathology. The coronary calcium score/coronary CTA interpretation by the cardiologist is attached.  COMPARISON:  Chest CT 04/26/2017.  FINDINGS: Atherosclerotic calcifications in the thoracic aorta. 5 mm left lower lobe pulmonary nodule (axial image 29 of series 11). Within the visualized portions of the thorax there are no other larger more suspicious appearing pulmonary nodules or masses, there is no acute consolidative airspace disease, no pleural effusions, no pneumothorax and no lymphadenopathy. Visualized portions of the upper abdomen demonstrates a 1.9 cm low-attenuation lesion in segment 2 of the liver, compatible with a simple cyst. There are no aggressive appearing lytic or blastic lesions noted in the visualized portions of the skeleton. Surgical  clips in  the right breast, likely from prior lumpectomy.  IMPRESSION: 1. 5 mm pulmonary nodule in the left lower lobe, stable compared to prior chest CT 04/26/2017, considered definitively benign requiring no future imaging follow-up. 2.  Aortic Atherosclerosis (ICD10-I70.0).  Electronically Signed: By: Trudie Reed M.D. On: 07/03/2021 10:54          Risk Assessment/Calculations:             Physical Exam:   VS:  BP 124/76   Pulse 64   Ht 5' 4.5" (1.638 m)   Wt 191 lb 6.4 oz (86.8 kg)   SpO2 96%   BMI 32.35 kg/m    Wt Readings from Last 3 Encounters:  11/28/23 191 lb 6.4 oz (86.8 kg)  11/22/23 187 lb (84.8 kg)  09/22/23 194 lb (88 kg)    GEN: Well nourished, well developed in no acute distress NECK: No JVD; No carotid bruits CARDIAC: RRR, no murmurs, rubs, gallops RESPIRATORY:  Clear to auscultation without rales, wheezing or rhonchi  ABDOMEN: Soft, non-tender, non-distended EXTREMITIES:  No edema; No deformity   ASSESSMENT AND PLAN: .    Preop clearance - Pending hip surgery. According to the Revised Cardiac Risk Index (RCRI), her Perioperative Risk of Major Cardiac Event is (%): 6.6. Her Functional Capacity in METs is: 4.64 according to the Duke Activity Status Index (DASI). Per AHA/ACC guidelines, she is deemed acceptable risk for the planned procedure without additional cardiovascular testing. May hold Aspirin 7 days prior to surgery. She is aware to hold Mounjaro 7 days prior to surgery.    HTN - Blood pressure continues to be well controlled on chlothalidone 25 mg. She is working closely with Dr. Dalbert Garnet and has lost around 20 lbs. Discussed if she continues to lose weight and SBP routinely <120 consider reducing dose Chlorthalidone.   HLD, LDL goal <70 -  10/2023 LDL 50. Continue rosuvastatin 20 mg.  CAD - No obstructive disease on coronary CTA 07/2021.No anginal symptoms. Continue aspirin and rosuvastatin. No acute changes on EKG today.  Encouraged exercise  and she is hopeful she can do more activity once she recovers from surgery.        Dispo: Follow up in 1 year.   Signed, Alver Sorrow, NP

## 2023-11-29 NOTE — Progress Notes (Signed)
Surgical Instructions   Your procedure is scheduled on Tuesday, February 4, 25. Report to [facility] Main Entrance "A" at 5:30 A.M., then check in with the Admitting office. Any questions or running late day of surgery: call [phone]  Questions prior to your surgery date: call [phone], Monday-Friday, 8am-4pm. If you experience any cold or flu symptoms such as cough, fever, chills, shortness of breath, etc. between now and your scheduled surgery, please notify us at the above number.     Remember:  Do not eat after midnight the night before your surgery  You may drink clear liquids until 4:30 the morning of your surgery.   Clear liquids allowed are: Water, Non-Citrus Juices (without pulp), Carbonated Beverages, Clear Tea (no milk, honey, etc.), Black Coffee Only (NO MILK, CREAM OR POWDERED CREAMER of any kind), and Gatorade.    Patient Instructions  The day of surgery (if you have diabetes): Drink ONE (1) 12 oz G2 given to you in your pre admission testing appointment by 4:30 the morning of surgery. Drink in one sitting. Do not sip.  This drink was given to you during your hospital pre-op appointment visit.  Nothing else to drink after completing the 12 oz bottle of G2.         If you have questions, please contact your surgeon's office.   Take these medicines the morning of surgery with A SIP OF WATER  escitalopram (LEXAPRO)  gabapentin (NEURONTIN)  pantoprazole (PROTONIX)  rosuvastatin (CRESTOR)    May take these medicines IF NEEDED: HYDROcodone-acetaminophen (NORCO)    One week prior to surgery, STOP taking any Aleve, Naproxen, Ibuprofen, Motrin, Advil, Goody's, BC's, all herbal medications, fish oil, and non-prescription vitamins. This includes your ASPIRIN.   Per your surgeon's order, HOLD your tirzepatide [facility]) for 7 days prior to your surgery.  Your last dose of tirzepatide [facility]) should be on or before 11-28-23.                  Do NOT Smoke  (Tobacco/Vaping) for 24 hours prior to your procedure.  If you use a CPAP at night, you may bring your mask/headgear for your overnight stay.   You will be asked to remove any contacts, glasses, piercing's, hearing aid's, dentures/partials prior to surgery. Please bring cases for these items if needed.    Patients discharged the day of surgery will not be allowed to drive home, and someone needs to stay with them for 24 hours.  SURGICAL WAITING ROOM VISITATION Patients may have no more than 2 support people in the waiting area - these visitors may rotate.   Pre-op nurse will coordinate an appropriate time for 1 ADULT support person, who may not rotate, to accompany patient in pre-op.  Children under the age of 69 must have an adult with them who is not the patient and must remain in the main waiting area with an adult.  If the patient needs to stay at the hospital during part of their recovery, the visitor guidelines for inpatient rooms apply.  Please refer to the [facility] website for the visitor guidelines for any additional information.   If you received a COVID test during your pre-op visit  it is requested that you wear a mask when out in public, stay away from anyone that may not be feeling well and notify your surgeon if you develop symptoms. If you have been in contact with anyone that has tested positive in the last 10 days please notify you surgeon.  Pre-operative 5 CHG Bathing Instructions   You can play a key role in reducing the risk of infection after surgery. Your skin needs to be as free of germs as possible. You can reduce the number of germs on your skin by washing with CHG (chlorhexidine gluconate) soap before surgery. CHG is an antiseptic soap that kills germs and continues to kill germs even after washing.   DO NOT use if you have an allergy to chlorhexidine/CHG or antibacterial soaps. If your skin becomes reddened or irritated, stop using the CHG and notify one  of our RNs at [phone].   Please shower with the CHG soap starting 4 days before surgery using the following schedule:     Please keep in mind the following:  DO NOT shave, including legs and underarms, starting the day of your first shower.   You may shave your face at any point before/day of surgery.  Place clean sheets on your bed the day you start using CHG soap. Use a clean washcloth (not used since being washed) for each shower. DO NOT sleep with pets once you start using the CHG.   CHG Shower Instructions:  Wash your face and private area with normal soap. If you choose to wash your hair, wash first with your normal shampoo.  After you use shampoo/soap, rinse your hair and body thoroughly to remove shampoo/soap residue.  Turn the water OFF and apply about 3 tablespoons (45 ml) of CHG soap to a CLEAN washcloth.  Apply CHG soap ONLY FROM YOUR NECK DOWN TO YOUR TOES (washing for 3-5 minutes)  DO NOT use CHG soap on face, private areas, open wounds, or sores.  Pay special attention to the area where your surgery is being performed.  If you are having back surgery, having someone wash your back for you may be helpful. Wait 2 minutes after CHG soap is applied, then you may rinse off the CHG soap.  Pat dry with a clean towel  Put on clean clothes/pajamas   If you choose to wear lotion, please use ONLY the CHG-compatible lotions that are listed below.  Additional instructions for the day of surgery: DO NOT APPLY any lotions, deodorants, cologne, or perfumes.   Do not bring valuables to the hospital. [facility] is not responsible for any belongings/valuables. Do not wear nail polish, gel polish, artificial nails, or any other type of covering on natural nails (fingers and toes) Do not wear jewelry or makeup Put on clean/comfortable clothes.  Please brush your teeth.  Ask your nurse before applying any prescription medications to the skin.     CHG Compatible Lotions   Aveeno  Moisturizing lotion  Cetaphil Moisturizing Cream  Cetaphil Moisturizing Lotion  Clairol Herbal Essence Moisturizing Lotion, Dry Skin  Clairol Herbal Essence Moisturizing Lotion, Extra Dry Skin  Clairol Herbal Essence Moisturizing Lotion, Normal Skin  Curel Age Defying Therapeutic Moisturizing Lotion with Alpha Hydroxy  Curel Extreme Care Body Lotion  Curel Soothing Hands Moisturizing Hand Lotion  Curel Therapeutic Moisturizing Cream, Fragrance-Free  Curel Therapeutic Moisturizing Lotion, Fragrance-Free  Curel Therapeutic Moisturizing Lotion, Original Formula  Eucerin Daily Replenishing Lotion  Eucerin Dry Skin Therapy Plus Alpha Hydroxy Crme  Eucerin Dry Skin Therapy Plus Alpha Hydroxy Lotion  Eucerin Original Crme  Eucerin Original Lotion  Eucerin Plus Crme Eucerin Plus Lotion  Eucerin TriLipid Replenishing Lotion  Keri Anti-Bacterial Hand Lotion  Keri Deep Conditioning Original Lotion Dry Skin Formula Softly Scented  Keri Deep Conditioning Original Lotion, Fragrance Free Sensitive  Skin Formula  Keri Lotion Fast Absorbing Fragrance Free Sensitive Skin Formula  Keri Lotion Fast Absorbing Softly Scented Dry Skin Formula  Keri Original Lotion  Keri Skin Renewal Lotion Keri Silky Smooth Lotion  Keri Silky Smooth Sensitive Skin Lotion  Nivea Body Creamy Conditioning Oil  Nivea Body Extra Enriched Lotion  Nivea Body Original Lotion  Nivea Body Sheer Moisturizing Lotion Nivea Crme  Nivea Skin Firming Lotion  NutraDerm 30 Skin Lotion  NutraDerm Skin Lotion  NutraDerm Therapeutic Skin Cream  NutraDerm Therapeutic Skin Lotion  ProShield Protective Hand Cream  Provon moisturizing lotion  Please read over the following fact sheets that you were given.

## 2023-11-30 ENCOUNTER — Encounter (HOSPITAL_COMMUNITY): Payer: Self-pay

## 2023-11-30 ENCOUNTER — Encounter (HOSPITAL_COMMUNITY)
Admission: RE | Admit: 2023-11-30 | Discharge: 2023-11-30 | Disposition: A | Discharge status: Home / Self Care | Payer: PPO | Source: Ambulatory Visit | Attending: Orthopaedic Surgery | Admitting: Orthopaedic Surgery

## 2023-11-30 ENCOUNTER — Other Ambulatory Visit: Payer: Self-pay

## 2023-11-30 VITALS — BP 124/67 | HR 74 | Temp 98.2°F | Resp 17 | Ht 64.0 in | Wt 192.7 lb

## 2023-11-30 DIAGNOSIS — Z96653 Presence of artificial knee joint, bilateral: Secondary | ICD-10-CM | POA: Insufficient documentation

## 2023-11-30 DIAGNOSIS — M1612 Unilateral primary osteoarthritis, left hip: Secondary | ICD-10-CM | POA: Insufficient documentation

## 2023-11-30 DIAGNOSIS — K219 Gastro-esophageal reflux disease without esophagitis: Secondary | ICD-10-CM | POA: Insufficient documentation

## 2023-11-30 DIAGNOSIS — Z01812 Encounter for preprocedural laboratory examination: Secondary | ICD-10-CM | POA: Diagnosis present

## 2023-11-30 DIAGNOSIS — E1140 Type 2 diabetes mellitus with diabetic neuropathy, unspecified: Secondary | ICD-10-CM | POA: Diagnosis not present

## 2023-11-30 DIAGNOSIS — E7800 Pure hypercholesterolemia, unspecified: Secondary | ICD-10-CM | POA: Insufficient documentation

## 2023-11-30 DIAGNOSIS — Z01818 Encounter for other preprocedural examination: Secondary | ICD-10-CM

## 2023-11-30 DIAGNOSIS — I10 Essential (primary) hypertension: Secondary | ICD-10-CM | POA: Insufficient documentation

## 2023-11-30 DIAGNOSIS — E669 Obesity, unspecified: Secondary | ICD-10-CM | POA: Insufficient documentation

## 2023-11-30 DIAGNOSIS — Z8249 Family history of ischemic heart disease and other diseases of the circulatory system: Secondary | ICD-10-CM | POA: Diagnosis not present

## 2023-11-30 DIAGNOSIS — I2510 Atherosclerotic heart disease of native coronary artery without angina pectoris: Secondary | ICD-10-CM | POA: Diagnosis not present

## 2023-11-30 DIAGNOSIS — K760 Fatty (change of) liver, not elsewhere classified: Secondary | ICD-10-CM | POA: Diagnosis not present

## 2023-11-30 DIAGNOSIS — Z87891 Personal history of nicotine dependence: Secondary | ICD-10-CM | POA: Insufficient documentation

## 2023-11-30 DIAGNOSIS — Z6833 Body mass index (BMI) 33.0-33.9, adult: Secondary | ICD-10-CM | POA: Insufficient documentation

## 2023-11-30 LAB — SURGICAL PCR SCREEN

## 2023-11-30 LAB — BASIC METABOLIC PANEL
Anion gap: 10 (ref 5–15)
BUN: 14 mg/dL (ref 8–23)
CO2: 27 mmol/L (ref 22–32)
Calcium: 9.4 mg/dL (ref 8.9–10.3)
Chloride: 94 mmol/L — ABNORMAL LOW (ref 98–111)
Creatinine, Ser: 0.9 mg/dL (ref 0.44–1.00)
GFR, Estimated: >60 mL/min (ref >60)
Glucose, Bld: 122 mg/dL — ABNORMAL HIGH (ref 70–99)
Potassium: 3.7 mmol/L (ref 3.5–5.1)
Sodium: 131 mmol/L — ABNORMAL LOW (ref 135–145)

## 2023-11-30 LAB — CBC
HCT: 37.5 % (ref 36.0–46.0)
Hemoglobin: 12.4 g/dL (ref 12.0–15.0)
MCH: 29.2 pg (ref 26.0–34.0)
MCHC: 33.1 g/dL (ref 30.0–36.0)
MCV: 88.2 fL (ref 80.0–100.0)
Platelets: 327 10*3/uL (ref 150–400)
RBC: 4.25 MIL/uL (ref 3.87–5.11)
RDW: 13.3 % (ref 11.5–15.5)
WBC: 10.9 10*3/uL — ABNORMAL HIGH (ref 4.0–10.5)
nRBC: 0 % (ref 0.0–0.2)

## 2023-11-30 LAB — TYPE AND SCREEN
ABO/RH(D): A POS
Antibody Screen: NEGATIVE

## 2023-11-30 LAB — GLUCOSE, CAPILLARY: Glucose-Capillary: 119 mg/dL — ABNORMAL HIGH (ref 70–99)

## 2023-11-30 NOTE — Progress Notes (Signed)
PCP - Dr. Gildardo Cranker Cardiologist - Dr. Chilton Si - cardiac clearance on 11-23-23  PPM/ICD - Denies Device Orders - n/a Rep Notified - n/a  Chest x-ray - n/a EKG - 11-28-23 Stress Test - 09-08-18 ECHO - 03-31-21 Cardiac Cath - Denies  Sleep Study - Denies CPAP - n/a  Fasting Blood Sugar - 110 - 120 Checks Blood Sugar 1 times a day  Last dose of GLP1 agonist-  Mounjaro GLP1 instructions: patient last took on 11-27-23.  Blood Thinner Instructions: Denies Aspirin Instructions: patient last took on 11-28-23.  ERAS Protcol - ERAS till 4:30 PRE-SURGERY Ensure or G2- G2   COVID TEST- No   Anesthesia review: Y, fam hx of PONV and Father BP drop, cardiac clearance  Patient denies shortness of breath, fever, cough and chest pain at PAT appointment. Patient denies any respiratory issues at this time.    All instructions explained to the patient, with a verbal understanding of the material. Patient agrees to go over the instructions while at home for a better understanding. Patient also instructed to self quarantine after being tested for COVID-19. The opportunity to ask questions was provided.

## 2023-12-01 NOTE — Progress Notes (Signed)
Anesthesia Chart Review:  Case: [id number] Date/Time: 12/06/23 0715   Procedure: LEFT TOTAL HIP ARTHROPLASTY ANTERIOR APPROACH (Left)   Anesthesia type: Choice   Pre-op diagnosis: osteoarthritis left hip   [facility]OM 04 / [facility]   Surgeons: Kathryne Hitch, MD       DISCUSSION: Patient is a 78 year old female scheduled for the above procedure.   History includes former smoker (quit 09/22/82), HTN, hypercholesterolemia, LE edema, DM2, neuropathy, coronary calcifications (mild CAD 07/03/21 CCTA), palpitations/PVCs, GERD, hiatal hernia, ulcerative colitis (colon resection 1986), fatty liver, right breast cancer (s/p right breast lumpectomy 11/16/18, s/p radiation), osteoarthritis (right THA 11/26/16; left TKA 12/02/17; right TKA 08/03/22), cholecystectomy (1992). BMI is consistent with obesity.   She has been followed by cardiologist Dr. Duke Salvia fairly consistently since 09/2015 following admission for chest pain. Symptoms felt to be atypical but with family history of CAD, and she had limited exercise due to back pain, so a Lexiscan was ordered. Testing was delayed due to back pain and also because symptoms resolved. She reported occasional chest pressure at her 08/18/18 follow-up, so a nuclear stress test was again ordered and was non-ischemic on 09/08/18. At her 06/25/21 visit on 06/25/21, she reported some severe back pain that radiated around her chest when walking, so a coronary CTA was ordered. This was done on 07/03/21 and showed coronary calcium score 460 (85th percentile) and mild CAD (25-49% mid RCA, proximal & mid LAD, < 25% LM), mild dilation of main pulmonary artery at 30 mm, may suggest increased pulmonary pressure. (RVSP 24.5 mmHg by 03/31/21 echo.) Although some plaque present, it was not felt severe enough to be causing her symptoms and aggressive medical therapy recommended. Last encounter was a pre-procedure evaluation on 11/28/23 by Gillian Shields, NP. Non anginal symptoms. On ASA  and statin. She wrote, "Pending hip surgery. According to the Revised Cardiac Risk Index (RCRI), her Perioperative Risk of Major Cardiac Event is (%): 6.6. Her Functional Capacity in METs is: 4.64 according to the Duke Activity Status Index (DASI). Per AHA/ACC guidelines, she is deemed acceptable risk for the planned procedure without additional cardiovascular testing. May hold Aspirin 7 days prior to surgery. She is aware to hold Mounjaro 7 days prior to surgery. "  Anesthesia team to evaluate on the day of surgery.     VS: BP 124/67   Pulse 74   Temp 36.8 C   Resp 17   Ht 5\' 4"  (1.626 m)   Wt 87.4 kg   SpO2 98%   BMI 33.08 kg/m   PROVIDERS: Daisy Floro, MD  is PCP  Chilton Si, MD is cardiologist [name], MD is [facility] provider Rachel Moulds, MD is HEM[facility]   LABS: Labs reviewed: Acceptable for surgery. A1c 6.3% 10/04/23 ([facility]). (all labs ordered are listed, but only abnormal results are displayed)  Labs Reviewed  SURGICAL PCR SCREEN - Abnormal; Notable for the following components:      Result Value   MRSA, PCR   (*)    Value: INVALID, UNABLE TO DETERMINE THE PRESENCE OF TARGET DUE TO SPECIMEN INTEGRITY. RECOLLECTION REQUESTED.   Staphylococcus aureus   (*)    Value: INVALID, UNABLE TO DETERMINE THE PRESENCE OF TARGET DUE TO SPECIMEN INTEGRITY. RECOLLECTION REQUESTED.   All other components within normal limits  CBC - Abnormal; Notable for the following components:   WBC 10.9 (*)    All other components within normal limits  BASIC METABOLIC PANEL - Abnormal; Notable  for the following components:   Sodium 131 (*)    Chloride 94 (*)    Glucose, Bld 122 (*)    All other components within normal limits  GLUCOSE, CAPILLARY - Abnormal; Notable for the following components:   Glucose-Capillary 119 (*)    All other components within normal limits  TYPE AND SCREEN     IMAGES: MRI Bilateral Hips 04/29/23: IMPRESSION: 1. Right total  hip arthroplasty without periarticular fluid collection or osteolysis. No hardware failure or complication. 2. Severe osteoarthritis of the left hip. Moderate left hip joint effusion. 3. Small partial-thickness tear of the hamstring origin bilaterally.    EKG: EKG 11/28/23: Sinus rhythm with 1st degree A-V block Incomplete right bundle branch block Confirmed by Gillian Shields ([id number]) on 11/28/2023 3:07:45 PM   CV: CT Cardiac 07/03/21: - Coronary arteries: Normal coronary origins.  Right dominance. - Right Coronary Artery: Mild mixed atherosclerotic plaque in the mid RCA, 25-49% stenosis. - Left Main Coronary Artery: Minimal mixed atherosclerotic plaque in the ostial and distal LM, <25% stenosis. - Left Anterior Descending Coronary Artery: Mild mixed atherosclerotic plaque in the proximal and mid LAD, 25-49% stenosis. - Left Circumflex Artery: No detectable plaque or stenosis. Mild mixed atherosclerotic plaque in mid OM1. - Aorta: Normal size, 33 mm at the mid ascending aorta (level of the PA bifurcation) measured double oblique. Minimal calcifications. No dissection. - Aortic Valve: No calcifications.  Tricuspid aortic valve. IMPRESSION: 1. Mild CAD in proximal and mild LAD, mid RCA and mid OM1, CADRADS = 2. 2. Coronary calcium score is 460, which places the patient in the 85th percentile for age and sex matched control. 3. Normal coronary origin with right dominance. 4. Mild dilation of main pulmonary artery, 30 mm, may suggest increased pulmonary pressure.     Echo 03/31/21: IMPRESSIONS   1. Left ventricular ejection fraction, by estimation, is 60 to 65%. The  left ventricle has normal function. The left ventricle has no regional  wall motion abnormalities. There is mild left ventricular hypertrophy.  Left ventricular diastolic parameters  are consistent with Grade I diastolic dysfunction (impaired relaxation).  Elevated left ventricular end-diastolic pressure.   2. Right  ventricular systolic function is hyperdynamic. The right  ventricular size is normal.   3. The mitral valve is grossly normal. Trivial mitral valve  regurgitation.   4. The aortic valve is tricuspid. Aortic valve regurgitation is not  visualized.  - Comparison(s): No prior Echocardiogram.      Nuclear stress test 09/08/18: The left ventricular ejection fraction is hyperdynamic (>65%). Nuclear stress EF: 77%. There was no ST segment deviation noted during stress. The study is normal. This is a low risk study. Normal pharmacologic nuclear stress test with no evidence for prior infarct or ischemia.    Past Medical History:  Diagnosis Date   Anxiety    Arthritis    Back, Hip- right   Atypical chest pain 06/25/2021   Back pain    Cancer (HCC)    right breast   Cataract    Colitis    Coronary artery calcification 02/20/2020   Diabetes mellitus without complication (HCC)    Type II   Edema 03/05/2021   Edema of both lower extremities    Endometriosis    Family history of adverse reaction to anesthesia    Father - N/V - blood pressure; daughter N/V   Family history of breast cancer    Family history of colon cancer    Family history of kidney cancer  Family history of prostate cancer    Fatty liver    GERD (gastroesophageal reflux disease)    History of hiatal hernia    History of kidney stones    History of ulcerative colitis    Hypertension    Insomnia    Joint pain    Kidney problem    Lower extremity edema 03/05/2021   Lumbar disc disease    L4 L5   Neuropathy    Osteoarthritis    Other hyperlipidemia    Palpitations    Personal history of radiation therapy    Pneumonia    1983, 2003   Pure hypercholesterolemia 02/20/2020   PVCs (premature ventricular contractions)    benign   Umbilical hernia    Vitamin D deficiency    Wears glasses     Past Surgical History:  Procedure Laterality Date   APPENDECTOMY     BREAST BIOPSY  08-13-2009  DR TSUEI    EXCISION LEFT NIPPLE DUCT   BREAST EXCISIONAL BIOPSY Left    x2   BREAST EXCISIONAL BIOPSY Right    BREAST LUMPECTOMY Right 11/16/2018   invasive ductal   CHOLECYSTECTOMY  1992   COLON RESECTION  1986   ULCERATIVE COLITIS   COLON SURGERY     ESOPHAGEAL MANOMETRY N/A 06/11/2013   Procedure: ESOPHAGEAL MANOMETRY (EM);  Surgeon: Petra Kuba, MD;  Location: [facility];  Service: Endoscopy;  Laterality: N/A;   ESOPHAGOGASTRODUODENOSCOPY (EGD) WITH PROPOFOL N/A 05/31/2016   Procedure: ESOPHAGOGASTRODUODENOSCOPY (EGD) WITH PROPOFOL;  Surgeon: Vida Rigger, MD;  Location: [facility] ENDOSCOPY;  Service: Endoscopy;  Laterality: N/A;   EXCISION RIGHT BREAST MASS   10-20-2005  DR Theron Arista YOUNG   EYE SURGERY Bilateral 2023   cataract   FRACTURE SURGERY     left arm   kidney stone removed  12/2009   KNEE ARTHROSCOPY Left    MCL   LEFT THUMB CARPOMETACARPAL JOINT SUSPENSIONPLASTY  04-06-2006  DR Mina Marble   LEFT WRIST ARTHROSCOPY W/ DEBRIDEMENT AND REMOVAL CYST  03-26-2004  DR Mina Marble   MYOMECTOMY     RADIOACTIVE SEED GUIDED PARTIAL MASTECTOMY WITH AXILLARY SENTINEL LYMPH NODE BIOPSY Right 11/16/2018   Procedure: RIGHT BREAST RADIOACTIVE SEED GUIDED PARTIAL MASTECTOMY WITH  SENTINEL  NODE BIOPSY;  Surgeon: Abigail Miyamoto, MD;  Location: [facility] OR;  Service: General;  Laterality: Right;   RIGHT COLECTOMY     RIGHT URETEROSCOPIC STONE EXTRACTION  12-11-2009  DR Jonny Ruiz [facility]   TENDON RELEASE     TONSILLECTOMY     TOTAL HIP ARTHROPLASTY Right 11/26/2016   Procedure: RIGHT TOTAL HIP ARTHROPLASTY ANTERIOR APPROACH;  Surgeon: Kathryne Hitch, MD;  Location: [facility]S;  Service: Orthopedics;  Laterality: Right;   TOTAL KNEE ARTHROPLASTY Left 12/02/2017   Procedure: LEFT TOTAL KNEE ARTHROPLASTY;  Surgeon: Kathryne Hitch, MD;  Location: [facility];  Service: Orthopedics;  Laterality: Left;   TOTAL KNEE ARTHROPLASTY Right 08/03/2022   Procedure: RIGHT TOTAL KNEE ARTHROPLASTY;  Surgeon: Kathryne Hitch, MD;  Location: [facility];  Service: Orthopedics;  Laterality: Right;   WRIST SURGERY     both    MEDICATIONS:  aspirin EC 81 MG tablet   B Complex-C (B-COMPLEX WITH VITAMIN C) tablet   chlorthalidone (HYGROTON) 25 MG tablet   escitalopram (LEXAPRO) 20 MG tablet   FIBER GUMMIES PO   fluticasone (FLONASE) 50 MCG/ACT nasal spray   gabapentin (NEURONTIN) 300 MG capsule   HYDROcodone-acetaminophen (NORCO) 10-325 MG tablet   Lancets (ONETOUCH DELICA PLUS LANCET33G) MISC  lidocaine (LMX) 4 % cream   Magnesium 250 MG TABS   methocarbamol (ROBAXIN) 500 MG tablet   ONETOUCH VERIO test strip   pantoprazole (PROTONIX) 40 MG tablet   phenylephrine (NEO-SYNEPHRINE) 0.25 % nasal spray   rosuvastatin (CRESTOR) 20 MG tablet   tirzepatide (MOUNJARO) 5 MG/0.5ML Pen   Vitamin D, Ergocalciferol, (DRISDOL) 1.25 MG (50000 UNIT) CAPS capsule   No current facility-administered medications for this encounter.   Last [facility] 11/27/23.  Last ASA 11/28/23.   Shonna Chock, PA-C Surgical Short Stay/Anesthesiology [facility] Phone [phone] [facility] Phone [phone] 12/01/2023 3:42 PM

## 2023-12-01 NOTE — Anesthesia Preprocedure Evaluation (Addendum)
Anesthesia Evaluation  Patient identified by MRN, date of birth, ID band Patient awake    Reviewed: Allergy & Precautions, NPO status , Patient's Chart, lab work & pertinent test results  History of Anesthesia Complications Negative for: history of anesthetic complications  Airway Mallampati: III  TM Distance: >3 FB Neck ROM: Full    Dental  (+) Teeth Intact, Dental Advisory Given   Pulmonary former smoker   Pulmonary exam normal breath sounds clear to auscultation       Cardiovascular hypertension, Pt. on medications + CAD (mild CAD 07/03/21 CCTA)  Normal cardiovascular exam Rhythm:Regular Rate:Normal  TTE 2022: 1. Left ventricular ejection fraction, by estimation, is 60 to 65%. The  left ventricle has normal function. The left ventricle has no regional  wall motion abnormalities. There is mild left ventricular hypertrophy.  Left ventricular diastolic parameters  are consistent with Grade I diastolic dysfunction (impaired relaxation).  Elevated left ventricular end-diastolic pressure.   2. Right ventricular systolic function is hyperdynamic. The right  ventricular size is normal.   3. The mitral valve is grossly normal. Trivial mitral valve  regurgitation.   4. The aortic valve is tricuspid. Aortic valve regurgitation is not  visualized.     Neuro/Psych  PSYCHIATRIC DISORDERS Anxiety Depression       GI/Hepatic Neg liver ROS, hiatal hernia,GERD  Medicated and Controlled,,  Endo/Other  diabetes (on Mounjaro (last dose 11/27/23)), Well Controlled, Type 2  Obesity BMI 33 FS this AM 75, starts to feel symptomatic around low 70s  Renal/GU negative Renal ROS  negative genitourinary   Musculoskeletal  (+) Arthritis ,    Abdominal  (+) + obese  Peds  Hematology negative hematology ROS (+)   Anesthesia Other Findings   Reproductive/Obstetrics negative OB ROS                               Anesthesia Physical Anesthesia Plan  ASA: 2  Anesthesia Plan: Spinal and MAC   Post-op Pain Management: Tylenol PO (pre-op)*   Induction:   PONV Risk Score and Plan: 3 and Treatment may vary due to age or medical condition, Ondansetron, Propofol infusion and Dexamethasone  Airway Management Planned: Natural Airway and Simple Face Mask  Additional Equipment: None  Intra-op Plan:   Post-operative Plan:   Informed Consent: I have reviewed the patients History and Physical, chart, labs and discussed the procedure including the risks, benefits and alternatives for the proposed anesthesia with the patient or authorized representative who has indicated his/her understanding and acceptance.       Plan Discussed with: CRNA  Anesthesia Plan Comments: (TKR 2023 under spinal no issues  )        Anesthesia Quick Evaluation

## 2023-12-05 ENCOUNTER — Telehealth: Payer: Self-pay | Admitting: *Deleted

## 2023-12-05 NOTE — H&P (Signed)
TOTAL HIP ADMISSION H&P  Patient is admitted for left total hip arthroplasty.  Subjective:  Chief Complaint: left hip pain  HPI: Lauren [name], 78 y.o. female, has a history of pain and functional disability in the left hip(s) due to arthritis and patient has failed non-surgical conservative treatments for greater than 12 weeks to include NSAID's and/or analgesics, corticosteriod injections, flexibility and strengthening excercises, use of assistive devices, weight reduction as appropriate, and activity modification.  Onset of symptoms was gradual starting 2 years ago with gradually worsening course since that time.The patient noted no past surgery on the left hip(s).  Patient currently rates pain in the left hip at 10 out of 10 with activity. Patient has night pain, worsening of pain with activity and weight bearing, trendelenberg gait, pain that interfers with activities of daily living, and pain with passive range of motion. Patient has evidence of subchondral sclerosis, periarticular osteophytes, and joint space narrowing by imaging studies. This condition presents safety issues increasing the risk of falls.  There is no current active infection.  Patient Active Problem List   Diagnosis Date Noted   Stress incontinence 11/22/2023   Restless leg syndrome 09/22/2023   Other hyperlipidemia 06/08/2023   Unilateral primary osteoarthritis, left hip 05/11/2023   Depression 03/16/2023   Allergic rhinitis 12/29/2022   Seasonal affective disorder (HCC) 12/01/2022   BMI 32.0-32.9,adult 12/01/2022   Obesity, Beginning BMI 37.59 12/01/2022   OA (osteoarthritis) of knee 08/03/2022   Status post total right knee replacement 08/03/2022   Stress 07/19/2022   Other constipation 07/19/2022   Atypical chest pain 06/25/2021   Class 2 severe obesity with serious comorbidity and body mass index (BMI) of 37.0 to 37.9 in adult (HCC) 06/03/2021   Vitamin D deficiency 06/03/2021   Other fatigue 04/22/2021    SOB (shortness of breath) on exertion 04/22/2021   Lower extremity edema 03/05/2021   Coronary artery calcification 02/20/2020   Pure hypercholesterolemia 02/20/2020   Unilateral primary osteoarthritis, right knee 01/03/2019   Genetic testing 12/07/2018   Family history of breast cancer    Family history of prostate cancer    Family history of colon cancer    Family history of kidney cancer    Malignant neoplasm of upper-outer quadrant of right breast in female, 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/09/2018   Diabetic neuropathy, type II diabetes mellitus (HCC) 11/09/2018   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/02/2017   Trochanteric bursitis, right hip 05/10/2017   Status post total replacement of right hip 11/26/2016   DDD (degenerative disc disease), 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   Low back pain 04/23/2015   Hiatal hernia 04/13/2013   Diabetes mellitus (HCC) 01/26/2013   Umbilical hernia 07/24/2012   Essential hypertension    Past Medical History:  Diagnosis Date   Anxiety    Arthritis    Back, Hip- right   Atypical chest pain 06/25/2021   Back pain    Cancer (HCC)    right breast   Cataract    Colitis    Coronary artery calcification 02/20/2020   Diabetes mellitus without complication (HCC)    Type II   Edema 03/05/2021   Edema of both lower extremities    Endometriosis    Family history of adverse reaction to anesthesia    Father - N/V - blood pressure; daughter N/V   Family history of breast cancer    Family history of colon cancer    Family history of kidney cancer    Family history of prostate  cancer    Fatty liver    GERD (gastroesophageal reflux disease)    History of hiatal hernia    History of kidney stones    History of ulcerative colitis    Hypertension    Insomnia    Joint pain    Kidney problem    Lower extremity edema 03/05/2021   Lumbar disc disease    L4 L5   Neuropathy    Osteoarthritis    Other hyperlipidemia    Palpitations    Personal  history of radiation therapy    Pneumonia    1983, 2003   Pure hypercholesterolemia 02/20/2020   PVCs (premature ventricular contractions)    benign   Umbilical hernia    Vitamin D deficiency    Wears glasses     Past Surgical History:  Procedure Laterality Date   APPENDECTOMY     BREAST BIOPSY  08-13-2009  DR TSUEI   EXCISION LEFT NIPPLE DUCT   BREAST EXCISIONAL BIOPSY Left    x2   BREAST EXCISIONAL BIOPSY Right    BREAST LUMPECTOMY Right 11/16/2018   invasive ductal   CHOLECYSTECTOMY  1992   COLON RESECTION  1986   ULCERATIVE COLITIS   COLON SURGERY     ESOPHAGEAL MANOMETRY N/A 06/11/2013   Procedure: ESOPHAGEAL MANOMETRY (EM);  Surgeon: Petra Kuba, MD;  Location: [facility];  Service: Endoscopy;  Laterality: N/A;   ESOPHAGOGASTRODUODENOSCOPY (EGD) WITH PROPOFOL N/A 05/31/2016   Procedure: ESOPHAGOGASTRODUODENOSCOPY (EGD) WITH PROPOFOL;  Surgeon: Vida Rigger, MD;  Location: [facility] ENDOSCOPY;  Service: Endoscopy;  Laterality: N/A;   EXCISION RIGHT BREAST MASS   10-20-2005  DR Theron Arista YOUNG   EYE SURGERY Bilateral 2023   cataract   FRACTURE SURGERY     left arm   kidney stone removed  12/2009   KNEE ARTHROSCOPY Left    MCL   LEFT THUMB CARPOMETACARPAL JOINT SUSPENSIONPLASTY  04-06-2006  DR Mina Marble   LEFT WRIST ARTHROSCOPY W/ DEBRIDEMENT AND REMOVAL CYST  03-26-2004  DR Mina Marble   MYOMECTOMY     RADIOACTIVE SEED GUIDED PARTIAL MASTECTOMY WITH AXILLARY SENTINEL LYMPH NODE BIOPSY Right 11/16/2018   Procedure: RIGHT BREAST RADIOACTIVE SEED GUIDED PARTIAL MASTECTOMY WITH  SENTINEL  NODE BIOPSY;  Surgeon: Abigail Miyamoto, MD;  Location: [facility] OR;  Service: General;  Laterality: Right;   RIGHT COLECTOMY     RIGHT URETEROSCOPIC STONE EXTRACTION  12-11-2009  DR Jonny Ruiz [facility]   TENDON RELEASE     TONSILLECTOMY     TOTAL HIP ARTHROPLASTY Right 11/26/2016   Procedure: RIGHT TOTAL HIP ARTHROPLASTY ANTERIOR APPROACH;  Surgeon: Kathryne Hitch, MD;  Location: [facility] ORS;  Service:  Orthopedics;  Laterality: Right;   TOTAL KNEE ARTHROPLASTY Left 12/02/2017   Procedure: LEFT TOTAL KNEE ARTHROPLASTY;  Surgeon: Kathryne Hitch, MD;  Location: [facility] ORS;  Service: Orthopedics;  Laterality: Left;   TOTAL KNEE ARTHROPLASTY Right 08/03/2022   Procedure: RIGHT TOTAL KNEE ARTHROPLASTY;  Surgeon: Kathryne Hitch, MD;  Location: [facility] OR;  Service: Orthopedics;  Laterality: Right;   WRIST SURGERY     both    No current facility-administered medications for this encounter.   Current Outpatient Medications  Medication Sig Dispense Refill Last Dose/Taking   aspirin EC 81 MG tablet Take 81 mg by mouth daily. Swallow whole.   Taking   B Complex-C (B-COMPLEX WITH VITAMIN C) tablet Take 1 tablet by mouth daily.   Taking   chlorthalidone (HYGROTON) 25 MG tablet TAKE ONE TABLET BY MOUTH ONCE DAILY 90  tablet 0 Taking   escitalopram (LEXAPRO) 20 MG tablet Take 1 tablet (20 mg total) by mouth daily. 90 tablet 0 Taking   FIBER GUMMIES PO Take 1-2 each by mouth See admin instructions. Take 2 gummies with breakfast and 1 gummy with dinner   Taking   gabapentin (NEURONTIN) 300 MG capsule Take 600 mg by mouth at bedtime.   1 Taking   HYDROcodone-acetaminophen (NORCO) 10-325 MG tablet Take 1 tablet by mouth every 6 (six) hours as needed for moderate pain (pain score 4-6).   Taking As Needed   lidocaine (LMX) 4 % cream Apply 1 application topically daily as needed (for joint pain).    Taking As Needed   Magnesium 250 MG TABS Take 250 mg by mouth daily.   Taking   methocarbamol (ROBAXIN) 500 MG tablet Take 1 tablet (500 mg total) by mouth every 6 (six) hours as needed for muscle spasms. (Patient taking differently: Take 500 mg by mouth at bedtime.) 60 tablet 0 Taking Differently   pantoprazole (PROTONIX) 40 MG tablet Take 40 mg by mouth 2 (two) times daily.   Taking   phenylephrine (NEO-SYNEPHRINE) 0.25 % nasal spray Place 1 spray into both nostrils at bedtime as needed for congestion.    Taking As Needed   rosuvastatin (CRESTOR) 20 MG tablet Take 20 mg by mouth daily.   Taking   fluticasone (FLONASE) 50 MCG/ACT nasal spray Place 2 sprays into both nostrils daily as needed for allergies or rhinitis. (Patient not taking: Reported on 11/28/2023) 9.9 mL 0 Not Taking   Lancets (ONETOUCH DELICA PLUS LANCET33G) MISC       ONETOUCH VERIO test strip       tirzepatide (MOUNJARO) 5 MG/0.5ML Pen Inject 5 mg into the skin once a week. 2 mL 0    Vitamin D, Ergocalciferol, (DRISDOL) 1.25 MG (50000 UNIT) CAPS capsule Take 1 capsule (50,000 Units total) by mouth every 14 (fourteen) days. (Patient not taking: Reported on 11/28/2023) 6 capsule 0 Not Taking   Allergies  Allergen Reactions   Ciprofloxacin Hcl Hives   Percocet [Oxycodone-Acetaminophen] Other (See Comments)    RESPIRATORY DISTRESS   Penicillins Hives and Other (See Comments)    Has patient had a PCN reaction causing immediate rash, facial/tongue/throat swelling, SOB or lightheadedness with hypotension: no Has patient had a PCN reaction causing severe rash involving mucus membranes or skin necrosis: no Has patient had a PCN reaction that required hospitalization no Has patient had a PCN reaction occurring within the last 10 years: no If all of the above answers are "NO", then may proceed with Cephalosporin use.   Sulfa Antibiotics Hives   Tape Other (See Comments)    Adhesives - Causes Blisters. No band aids, and plastic tape    Tetanus Toxoids Hives and Rash   Jardiance [Empagliflozin]     Severe UTI    Social History   Tobacco Use   Smoking status: Former    Current packs/day: 0.00    Average packs/day: 1 pack/day for 10.0 years (10.0 ttl pk-yrs)    Types: Cigarettes    Start date: 09/22/1972    Quit date: 09/22/1982    Years since quitting: 41.2   Smokeless tobacco: Never   Tobacco comments:    quit 1983  Substance Use Topics   Alcohol use: No    Family History  Problem Relation Age of Onset   Hyperlipidemia  Mother    Diabetes Mother    Hypertension Mother    Parkinson's disease  Mother    Kidney disease Mother    Kidney disease Father    Stroke Father    Heart failure Father    Hypertension Father    Heart disease Father    Diabetes Father    Cancer Father        colon, prostate, and kidney   Heart disease Maternal Grandmother    Diabetes Maternal Grandmother    Heart disease Maternal Grandfather    Diabetes Maternal Grandfather    Cancer Maternal Grandfather        pt unaware of what kind   Stroke Paternal Grandmother    Stroke Paternal Grandfather    Cancer Maternal Aunt        cervical vs ovarian   Colon cancer Paternal Aunt        dx less than 50   Breast cancer Paternal Aunt        dx in her 48s   Prostate cancer Paternal Uncle    Breast cancer Cousin        pat first cousin, dx over 54   Prostate cancer Other        PGF's father     Review of Systems  Objective:  Physical Exam Vitals reviewed.  Constitutional:      Appearance: Normal appearance. She is normal weight.  HENT:     Head: Normocephalic and atraumatic.  Eyes:     Extraocular Movements: Extraocular movements intact.     Pupils: Pupils are equal, round, and reactive to light.  Cardiovascular:     Rate and Rhythm: Normal rate and regular rhythm.  Pulmonary:     Effort: Pulmonary effort is normal.     Breath sounds: Normal breath sounds.  Abdominal:     Palpations: Abdomen is soft.  Musculoskeletal:     Cervical back: Normal range of motion and neck supple.     Left hip: Tenderness and bony tenderness present. Decreased range of motion. Decreased strength.  Neurological:     Mental Status: She is alert and oriented to person, place, and time.  Psychiatric:        Behavior: Behavior normal.     Vital signs in last 24 hours:    Labs:   Estimated body mass index is 33.08 kg/m as calculated from the following:   Height as of 11/30/23: 5\' 4"  (1.626 m).   Weight as of 11/30/23: 87.4  kg.   Imaging Review Plain radiographs demonstrate severe degenerative joint disease of the left hip(s). The bone quality appears to be good for age and reported activity level.      Assessment/Plan:  End stage arthritis, left hip(s)  The patient history, physical examination, clinical judgement of the provider and imaging studies are consistent with end stage degenerative joint disease of the left hip(s) and total hip arthroplasty is deemed medically necessary. The treatment options including medical management, injection therapy, arthroscopy and arthroplasty were discussed at length. The risks and benefits of total hip arthroplasty were presented and reviewed. The risks due to aseptic loosening, infection, stiffness, dislocation/subluxation,  thromboembolic complications and other imponderables were discussed.  The patient acknowledged the explanation, agreed to proceed with the plan and consent was signed. Patient is being admitted for inpatient treatment for surgery, pain control, PT, OT, prophylactic antibiotics, VTE prophylaxis, progressive ambulation and ADL's and discharge planning.The patient is planning to be discharged home with home health services

## 2023-12-05 NOTE — Care Plan (Signed)
[facility] RNCM call to patient to discuss her upcoming Left total hip replacement with Dr. Magnus Ivan on 12/06/23 at [facility]. She is agreeable to case management. She lives alone, but will be going to her daughter's home for at least the first week after surgery to recover. She has a RW. Anticipate HHPT will be needed after a short hospital stay. Referral made to [facility] after choice provided. Reviewed post op instructions and answered questions. Will continue to follow for case management needs.

## 2023-12-05 NOTE — Telephone Encounter (Signed)
 [facility] pre-op call completed.

## 2023-12-06 ENCOUNTER — Observation Stay (HOSPITAL_COMMUNITY)
Admission: RE | Admit: 2023-12-06 | Discharge: 2023-12-07 | Disposition: A | Discharge status: Home / Self Care | Payer: PPO | Attending: Orthopaedic Surgery | Admitting: Orthopaedic Surgery

## 2023-12-06 ENCOUNTER — Ambulatory Visit (HOSPITAL_COMMUNITY): Payer: PPO | Admitting: Vascular Surgery

## 2023-12-06 ENCOUNTER — Ambulatory Visit (HOSPITAL_COMMUNITY): Payer: PPO

## 2023-12-06 ENCOUNTER — Ambulatory Visit (HOSPITAL_BASED_OUTPATIENT_CLINIC_OR_DEPARTMENT_OTHER): Payer: PPO | Admitting: Certified Registered"

## 2023-12-06 ENCOUNTER — Other Ambulatory Visit: Payer: Self-pay

## 2023-12-06 ENCOUNTER — Observation Stay (HOSPITAL_COMMUNITY): Payer: PPO

## 2023-12-06 ENCOUNTER — Encounter (HOSPITAL_COMMUNITY)
Admission: RE | Disposition: A | Discharge status: Home / Self Care | Payer: Self-pay | Source: Home / Self Care | Attending: Orthopaedic Surgery

## 2023-12-06 DIAGNOSIS — M1612 Unilateral primary osteoarthritis, left hip: Principal | ICD-10-CM

## 2023-12-06 DIAGNOSIS — Z853 Personal history of malignant neoplasm of breast: Secondary | ICD-10-CM | POA: Diagnosis not present

## 2023-12-06 DIAGNOSIS — Z96653 Presence of artificial knee joint, bilateral: Secondary | ICD-10-CM | POA: Insufficient documentation

## 2023-12-06 DIAGNOSIS — Z79899 Other long term (current) drug therapy: Secondary | ICD-10-CM | POA: Diagnosis not present

## 2023-12-06 DIAGNOSIS — Z7982 Long term (current) use of aspirin: Secondary | ICD-10-CM | POA: Insufficient documentation

## 2023-12-06 DIAGNOSIS — E119 Type 2 diabetes mellitus without complications: Secondary | ICD-10-CM | POA: Diagnosis not present

## 2023-12-06 DIAGNOSIS — Z87891 Personal history of nicotine dependence: Secondary | ICD-10-CM | POA: Insufficient documentation

## 2023-12-06 DIAGNOSIS — Z96642 Presence of left artificial hip joint: Secondary | ICD-10-CM

## 2023-12-06 DIAGNOSIS — I10 Essential (primary) hypertension: Secondary | ICD-10-CM | POA: Insufficient documentation

## 2023-12-06 DIAGNOSIS — Z96641 Presence of right artificial hip joint: Secondary | ICD-10-CM | POA: Insufficient documentation

## 2023-12-06 HISTORY — PX: TOTAL HIP ARTHROPLASTY: SHX124

## 2023-12-06 LAB — GLUCOSE, CAPILLARY
Glucose-Capillary: 76 mg/dL (ref 70–99)
Glucose-Capillary: 121 mg/dL — ABNORMAL HIGH (ref 70–99)
Glucose-Capillary: 154 mg/dL — ABNORMAL HIGH (ref 70–99)
Glucose-Capillary: 124 mg/dL — ABNORMAL HIGH (ref 70–99)

## 2023-12-06 LAB — SURGICAL PCR SCREEN
MRSA, PCR: NEGATIVE
Staphylococcus aureus: NEGATIVE

## 2023-12-06 SURGERY — ARTHROPLASTY, HIP, TOTAL, ANTERIOR APPROACH
Anesthesia: General | Laterality: Left

## 2023-12-06 MED ORDER — INSULIN ASPART 100 UNIT/ML IJ SOLN: 0.0000 [IU] | INTRAMUSCULAR | Status: DC | PRN

## 2023-12-06 MED ORDER — DEXAMETHASONE SODIUM PHOSPHATE 10 MG/ML IJ SOLN
INTRAMUSCULAR | Status: DC | PRN
  Administered 2023-12-06: 5 mg via INTRAVENOUS

## 2023-12-06 MED ORDER — HYDROCODONE-ACETAMINOPHEN 7.5-325 MG PO TABS
1.0000 | ORAL_TABLET | Freq: Once | ORAL | Status: AC | PRN
  Administered 2023-12-06: 1 via ORAL

## 2023-12-06 MED ORDER — MIDAZOLAM HCL 2 MG/2ML IJ SOLN
INTRAMUSCULAR | Status: DC | PRN
  Administered 2023-12-06: 1 mg via INTRAVENOUS

## 2023-12-06 MED ORDER — HYDROMORPHONE HCL 1 MG/ML IJ SOLN
INTRAMUSCULAR | Status: AC
  Filled 2023-12-06: qty 1

## 2023-12-06 MED ORDER — CHLORHEXIDINE GLUCONATE 0.12 % MT SOLN
15.0000 mL | Freq: Once | OROMUCOSAL | Status: AC
  Administered 2023-12-06: 15 mL via OROMUCOSAL
  Filled 2023-12-06: qty 15

## 2023-12-06 MED ORDER — ACETAMINOPHEN 500 MG PO TABS
1000.0000 mg | ORAL_TABLET | Freq: Once | ORAL | Status: AC
  Administered 2023-12-06: 1000 mg via ORAL
  Filled 2023-12-06: qty 2

## 2023-12-06 MED ORDER — HYDROCODONE-ACETAMINOPHEN 10-325 MG PO TABS
1.0000 | ORAL_TABLET | Freq: Four times a day (QID) | ORAL | Status: DC | PRN
  Administered 2023-12-06 – 2023-12-07 (×2): 1 via ORAL
  Filled 2023-12-06 (×2): qty 1

## 2023-12-06 MED ORDER — DEXAMETHASONE SODIUM PHOSPHATE 10 MG/ML IJ SOLN
INTRAMUSCULAR | Status: AC
  Filled 2023-12-06: qty 1

## 2023-12-06 MED ORDER — GABAPENTIN 300 MG PO CAPS: 600.0000 mg | ORAL_CAPSULE | Freq: Every day | ORAL | Status: DC

## 2023-12-06 MED ORDER — HYDROMORPHONE HCL 1 MG/ML IJ SOLN: 0.5000 mg | INTRAMUSCULAR | Status: DC | PRN

## 2023-12-06 MED ORDER — PROPOFOL 500 MG/50ML IV EMUL: INTRAVENOUS | Status: DC | PRN

## 2023-12-06 MED ORDER — METHOCARBAMOL 500 MG PO TABS: 500.0000 mg | ORAL_TABLET | Freq: Four times a day (QID) | ORAL | Status: DC | PRN

## 2023-12-06 MED ORDER — ONDANSETRON HCL 4 MG/2ML IJ SOLN
INTRAMUSCULAR | Status: DC | PRN
  Administered 2023-12-06: 4 mg via INTRAVENOUS

## 2023-12-06 MED ORDER — PHENYLEPHRINE 80 MCG/ML (10ML) SYRINGE FOR IV PUSH (FOR BLOOD PRESSURE SUPPORT)
PREFILLED_SYRINGE | INTRAVENOUS | Status: DC | PRN
  Administered 2023-12-06: 160 ug via INTRAVENOUS
  Administered 2023-12-06: 80 ug via INTRAVENOUS

## 2023-12-06 MED ORDER — DIPHENHYDRAMINE HCL 12.5 MG/5ML PO ELIX
12.5000 mg | ORAL_SOLUTION | ORAL | Status: DC | PRN
Start: 2023-12-06 — End: 2023-12-07

## 2023-12-06 MED ORDER — ONDANSETRON HCL 4 MG/2ML IJ SOLN: 4.0000 mg | Freq: Once | INTRAMUSCULAR | Status: DC | PRN

## 2023-12-06 MED ORDER — PHENYLEPHRINE HCL-NACL 20-0.9 MG/250ML-% IV SOLN
INTRAVENOUS | Status: DC | PRN
  Administered 2023-12-06: 20 ug/min via INTRAVENOUS

## 2023-12-06 MED ORDER — CHLORTHALIDONE 25 MG PO TABS: 25.0000 mg | ORAL_TABLET | Freq: Every day | ORAL | Status: DC

## 2023-12-06 MED ORDER — 0.9 % SODIUM CHLORIDE (POUR BTL) OPTIME
TOPICAL | Status: DC | PRN
  Administered 2023-12-06: 1000 mL

## 2023-12-06 MED ORDER — PHENYLEPHRINE 80 MCG/ML (10ML) SYRINGE FOR IV PUSH (FOR BLOOD PRESSURE SUPPORT)
PREFILLED_SYRINGE | INTRAVENOUS | Status: AC
  Filled 2023-12-06: qty 10

## 2023-12-06 MED ORDER — ONDANSETRON HCL 4 MG/2ML IJ SOLN: 4.0000 mg | Freq: Four times a day (QID) | INTRAMUSCULAR | Status: DC | PRN

## 2023-12-06 MED ORDER — METOCLOPRAMIDE HCL 5 MG PO TABS
5.0000 mg | ORAL_TABLET | Freq: Three times a day (TID) | ORAL | Status: DC | PRN
Start: 2023-12-06 — End: 2023-12-07

## 2023-12-06 MED ORDER — PANTOPRAZOLE SODIUM 40 MG PO TBEC
40.0000 mg | DELAYED_RELEASE_TABLET | Freq: Two times a day (BID) | ORAL | Status: DC
Start: 2023-12-06 — End: 2023-12-07
  Administered 2023-12-06 – 2023-12-07 (×2): 40 mg via ORAL
  Filled 2023-12-06 (×2): qty 1

## 2023-12-06 MED ORDER — FENTANYL CITRATE (PF) 250 MCG/5ML IJ SOLN
INTRAMUSCULAR | Status: AC
  Filled 2023-12-06: qty 5

## 2023-12-06 MED ORDER — METOCLOPRAMIDE HCL 5 MG/ML IJ SOLN: 5.0000 mg | Freq: Three times a day (TID) | INTRAMUSCULAR | Status: DC | PRN

## 2023-12-06 MED ORDER — MAGNESIUM OXIDE -MG SUPPLEMENT 400 (240 MG) MG PO TABS
200.0000 mg | ORAL_TABLET | Freq: Every day | ORAL | Status: DC
  Administered 2023-12-07: 200 mg via ORAL
  Filled 2023-12-06: qty 1

## 2023-12-06 MED ORDER — HYDROCODONE-ACETAMINOPHEN 7.5-325 MG PO TABS
ORAL_TABLET | ORAL | Status: AC
  Filled 2023-12-06: qty 1

## 2023-12-06 MED ORDER — PHENOL 1.4 % MT LIQD: 1.0000 | OROMUCOSAL | Status: DC | PRN

## 2023-12-06 MED ORDER — STERILE WATER FOR IRRIGATION IR SOLN
Status: DC | PRN
  Administered 2023-12-06: 1000 mL

## 2023-12-06 MED ORDER — MIDAZOLAM HCL 2 MG/2ML IJ SOLN
INTRAMUSCULAR | Status: AC
  Filled 2023-12-06: qty 2

## 2023-12-06 MED ORDER — ALUM & MAG HYDROXIDE-SIMETH 200-200-20 MG/5ML PO SUSP: 30.0000 mL | ORAL | Status: DC | PRN

## 2023-12-06 MED ORDER — ROCURONIUM BROMIDE 10 MG/ML (PF) SYRINGE
PREFILLED_SYRINGE | INTRAVENOUS | Status: DC | PRN
  Administered 2023-12-06: 60 mg via INTRAVENOUS

## 2023-12-06 MED ORDER — MENTHOL 3 MG MT LOZG: 1.0000 | LOZENGE | OROMUCOSAL | Status: DC | PRN

## 2023-12-06 MED ORDER — FENTANYL CITRATE (PF) 250 MCG/5ML IJ SOLN
INTRAMUSCULAR | Status: DC | PRN
  Administered 2023-12-06 (×2): 50 ug via INTRAVENOUS
  Administered 2023-12-06: 100 ug via INTRAVENOUS
  Administered 2023-12-06: 50 ug via INTRAVENOUS

## 2023-12-06 MED ORDER — AMISULPRIDE (ANTIEMETIC) 5 MG/2ML IV SOLN
10.0000 mg | Freq: Once | INTRAVENOUS | Status: AC | PRN
  Administered 2023-12-06: 10 mg via INTRAVENOUS

## 2023-12-06 MED ORDER — ORAL CARE MOUTH RINSE: 15.0000 mL | Freq: Once | OROMUCOSAL | Status: AC

## 2023-12-06 MED ORDER — TRANEXAMIC ACID-NACL 1000-0.7 MG/100ML-% IV SOLN
1000.0000 mg | INTRAVENOUS | Status: AC
  Administered 2023-12-06: 1000 mg via INTRAVENOUS
  Filled 2023-12-06: qty 100

## 2023-12-06 MED ORDER — HYDROMORPHONE HCL 1 MG/ML IJ SOLN
0.2500 mg | INTRAMUSCULAR | Status: DC | PRN
  Administered 2023-12-06 (×6): 0.5 mg via INTRAVENOUS

## 2023-12-06 MED ORDER — ASPIRIN 81 MG PO CHEW
81.0000 mg | CHEWABLE_TABLET | Freq: Two times a day (BID) | ORAL | Status: DC
  Administered 2023-12-06 – 2023-12-07 (×2): 81 mg via ORAL
  Filled 2023-12-06 (×2): qty 1

## 2023-12-06 MED ORDER — DOCUSATE SODIUM 100 MG PO CAPS
100.0000 mg | ORAL_CAPSULE | Freq: Two times a day (BID) | ORAL | Status: DC
  Administered 2023-12-06 – 2023-12-07 (×2): 100 mg via ORAL
  Filled 2023-12-06 (×2): qty 1

## 2023-12-06 MED ORDER — SODIUM CHLORIDE 0.9 % IR SOLN
Status: DC | PRN
  Administered 2023-12-06: 1000 mL

## 2023-12-06 MED ORDER — PROPOFOL 500 MG/50ML IV EMUL
INTRAVENOUS | Status: DC | PRN
  Administered 2023-12-06: 75 ug/kg/min via INTRAVENOUS
  Administered 2023-12-06: 50 mg via INTRAVENOUS

## 2023-12-06 MED ORDER — AMISULPRIDE (ANTIEMETIC) 5 MG/2ML IV SOLN
INTRAVENOUS | Status: AC
  Filled 2023-12-06: qty 4

## 2023-12-06 MED ORDER — ONDANSETRON HCL 4 MG PO TABS
4.0000 mg | ORAL_TABLET | Freq: Four times a day (QID) | ORAL | Status: DC | PRN
Start: 2023-12-06 — End: 2023-12-07

## 2023-12-06 MED ORDER — SUGAMMADEX SODIUM 200 MG/2ML IV SOLN
INTRAVENOUS | Status: DC | PRN
  Administered 2023-12-06: 200 mg via INTRAVENOUS

## 2023-12-06 MED ORDER — ONDANSETRON HCL 4 MG/2ML IJ SOLN
INTRAMUSCULAR | Status: AC
  Filled 2023-12-06: qty 2

## 2023-12-06 MED ORDER — LACTATED RINGERS IV SOLN
INTRAVENOUS | Status: DC
Start: 2023-12-06 — End: 2023-12-06

## 2023-12-06 MED ORDER — CEFAZOLIN SODIUM-DEXTROSE 2-4 GM/100ML-% IV SOLN
2.0000 g | INTRAVENOUS | Status: AC
  Administered 2023-12-06: 2 g via INTRAVENOUS
  Filled 2023-12-06: qty 100

## 2023-12-06 MED ORDER — ESCITALOPRAM OXALATE 20 MG PO TABS
20.0000 mg | ORAL_TABLET | Freq: Every day | ORAL | Status: DC
  Administered 2023-12-07: 20 mg via ORAL
  Filled 2023-12-06: qty 1

## 2023-12-06 MED ORDER — SODIUM CHLORIDE 0.9 % IV SOLN: INTRAVENOUS | Status: AC

## 2023-12-06 SURGICAL SUPPLY — 47 items
BAG COUNTER SPONGE SURGICOUNT (BAG) ×1 IMPLANT
BENZOIN TINCTURE PRP APPL 2/3 (GAUZE/BANDAGES/DRESSINGS) ×1 IMPLANT
BLADE CLIPPER SURG (BLADE) IMPLANT
BLADE SAW SGTL 18X1.27X75 (BLADE) ×1 IMPLANT
COLLAR OFFSET CORAIL SZ 11 HIP (Stem) IMPLANT
CORAIL OFFSET COLLAR SZ 11 HIP (Stem) ×1 IMPLANT
COVER SURGICAL LIGHT HANDLE (MISCELLANEOUS) ×1 IMPLANT
CUP SECTOR GRIPTON 50MM (Cup) IMPLANT
DRAPE C-ARM 42X72 X-RAY (DRAPES) ×1 IMPLANT
DRAPE STERI IOBAN 125X83 (DRAPES) ×1 IMPLANT
DRAPE U-SHAPE 47X51 STRL (DRAPES) ×3 IMPLANT
DRSG AQUACEL AG ADV 3.5X10 (GAUZE/BANDAGES/DRESSINGS) ×1 IMPLANT
DURAPREP 26ML APPLICATOR (WOUND CARE) ×1 IMPLANT
ELECT BLADE 4.0 EZ CLEAN MEGAD (MISCELLANEOUS) ×1
ELECT BLADE 6.5 EXT (BLADE) IMPLANT
ELECT REM PT RETURN 9FT ADLT (ELECTROSURGICAL) ×1
ELECTRODE BLDE 4.0 EZ CLN MEGD (MISCELLANEOUS) ×1 IMPLANT
ELECTRODE REM PT RTRN 9FT ADLT (ELECTROSURGICAL) ×1 IMPLANT
FACESHIELD WRAPAROUND (MASK) ×3
FACESHIELD WRAPAROUND OR TEAM (MASK) ×2 IMPLANT
GLOVE BIOGEL PI IND STRL 8 (GLOVE) ×2 IMPLANT
GLOVE ECLIPSE 8.0 STRL XLNG CF (GLOVE) ×1 IMPLANT
GLOVE ORTHO TXT STRL SZ7.5 (GLOVE) ×2 IMPLANT
GOWN STRL REUS W/ TWL LRG LVL3 (GOWN DISPOSABLE) ×2 IMPLANT
GOWN STRL REUS W/ TWL XL LVL3 (GOWN DISPOSABLE) ×2 IMPLANT
HEAD FEMORAL 32 CERAMIC (Hips) IMPLANT
KIT BASIN OR (CUSTOM PROCEDURE TRAY) ×1 IMPLANT
KIT TURNOVER KIT B (KITS) ×1 IMPLANT
LINER ACET PNNCL PLUS4 NEUTRAL (Hips) IMPLANT
MANIFOLD NEPTUNE II (INSTRUMENTS) ×1 IMPLANT
NS IRRIG 1000ML POUR BTL (IV SOLUTION) ×1 IMPLANT
PACK TOTAL JOINT (CUSTOM PROCEDURE TRAY) ×1 IMPLANT
PAD ARMBOARD 7.5X6 YLW CONV (MISCELLANEOUS) ×1 IMPLANT
PINNACLE PLUS 4 NEUTRAL (Hips) ×1 IMPLANT
SET HNDPC FAN SPRY TIP SCT (DISPOSABLE) ×1 IMPLANT
STAPLER VISISTAT 35W (STAPLE) IMPLANT
STRIP CLOSURE SKIN 1/2X4 (GAUZE/BANDAGES/DRESSINGS) ×2 IMPLANT
SUT ETHIBOND NAB CT1 #1 30IN (SUTURE) ×1 IMPLANT
SUT MNCRL AB 4-0 PS2 18 (SUTURE) IMPLANT
SUT VIC AB 0 CT1 27XBRD ANBCTR (SUTURE) ×1 IMPLANT
SUT VIC AB 1 CT1 27XBRD ANBCTR (SUTURE) ×1 IMPLANT
SUT VIC AB 2-0 CT1 TAPERPNT 27 (SUTURE) ×1 IMPLANT
TOWEL GREEN STERILE (TOWEL DISPOSABLE) ×1 IMPLANT
TOWEL GREEN STERILE FF (TOWEL DISPOSABLE) ×1 IMPLANT
TRAY CATH INTERMITTENT SS 16FR (CATHETERS) IMPLANT
TRAY FOLEY W/BAG SLVR 16FR ST (SET/KITS/TRAYS/PACK) IMPLANT
WATER STERILE IRR 1000ML POUR (IV SOLUTION) ×2 IMPLANT

## 2023-12-06 NOTE — Anesthesia Procedure Notes (Signed)
 Procedure Name: Intubation Date/Time: 12/06/2023 7:51 AM  Performed by: Marva Lonni PARAS, CRNAPre-anesthesia Checklist: Patient identified, Emergency Drugs available, Suction available and Patient being monitored Patient Re-evaluated:Patient Re-evaluated prior to induction Oxygen Delivery Method: Circle System Utilized Preoxygenation: Pre-oxygenation with 100% oxygen Induction Type: IV induction Ventilation: Mask ventilation without difficulty Laryngoscope Size: Mac and 3 Grade View: Grade II Tube type: Oral Tube size: 7.0 mm Number of attempts: 1 Airway Equipment and Method: Stylet and Oral airway Placement Confirmation: ETT inserted through vocal cords under direct vision, positive ETCO2 and breath sounds checked- equal and bilateral Secured at: 21 cm Tube secured with: Tape Dental Injury: Teeth and Oropharynx as per pre-operative assessment

## 2023-12-06 NOTE — Transfer of Care (Signed)
 Immediate Anesthesia Transfer of Care Note  Patient: Lauren [name]  Procedure(s) Performed: LEFT TOTAL HIP ARTHROPLASTY ANTERIOR APPROACH (Left)  Patient Location: PACU  Anesthesia Type:General  Level of Consciousness: drowsy and patient cooperative  Airway & Oxygen Therapy: Patient Spontanous Breathing and Patient connected to nasal cannula oxygen  Post-op Assessment: Report given to RN and Post -op Vital signs reviewed and stable  Post vital signs: Reviewed and stable  Last Vitals:  Vitals Value Taken Time  BP 114/56 12/06/23 0922  Temp    Pulse 69 12/06/23 0925  Resp 13 12/06/23 0925  SpO2 96 % 12/06/23 0925  Vitals shown include unfiled device data.  Last Pain:  Vitals:   12/06/23 0656  TempSrc:   PainSc: 3       Patients Stated Pain Goal: 3 (12/06/23 0656)  Complications: No notable events documented.

## 2023-12-06 NOTE — Evaluation (Signed)
 Physical Therapy Evaluation Patient Details Name: Lauren [name] MRN: [id number] DOB: 04-11-1946 Today's Date: 12/06/2023  History of Present Illness  78 y.o. female presents to [facility] hospital on 12/06/2023 for elective L THA. PMH includes HTN, hiatal hernia, breast CA, DMII, CAD.  Clinical Impression  Pt presents to PT with deficits in functional mobility, gait, balance, strength, power, arousal. Pt remains lethargic after receiving multiple pain medications earlier in the day. Pt intermittently arouses with stimulation but otherwise quickly falls back to sleep. Pt is able to stand and transfer with significant physical assistance. PT notes stress incontinence of urine during transfers but pt is unable to volitionally urinate on the [facility] at this time. PT will follow up for progression of mobility tomorrow when the pt is more alert.        If plan is discharge home, recommend the following: A lot of help with walking and/or transfers;A lot of help with bathing/dressing/bathroom;Assistance with cooking/housework;Assist for transportation;Help with stairs or ramp for entrance   Can travel by private vehicle        Equipment Recommendations None recommended by PT  Recommendations for Other Services       Functional Status Assessment Patient has had a recent decline in their functional status and demonstrates the ability to make significant improvements in function in a reasonable and predictable amount of time.     Precautions / Restrictions Precautions Precautions: Fall Precaution Comments: direct anterior THA Restrictions Weight Bearing Restrictions Per Provider Order: Yes LLE Weight Bearing Per Provider Order: Weight bearing as tolerated      Mobility  Bed Mobility Overal bed mobility: Needs Assistance Bed Mobility: Supine to Sit, Sit to Supine     Supine to sit: Max assist Sit to supine: Max assist        Transfers Overall transfer level: Needs assistance Equipment  used: Rolling walker (2 wheels), 1 person hand held assist Transfers: Sit to/from Stand, Bed to chair/wheelchair/BSC Sit to Stand: Mod assist   Step pivot transfers: Mod assist       General transfer comment: physical assist to initiate trunk flexion and to power up into standing. With step pivot transfer PT provides physical assist to facilitate lateral weight shift and stepping    Ambulation/Gait Ambulation/Gait assistance:  (deferred due to lethargy)                Stairs            Wheelchair Mobility     Tilt Bed    Modified Rankin (Stroke Patients Only)       Balance Overall balance assessment: Needs assistance Sitting-balance support: No upper extremity supported, Feet supported Sitting balance-[name]: Poor Sitting balance - Comments: minA Postural control: Posterior lean Standing balance support: Bilateral upper extremity supported Standing balance-[name]: Poor Standing balance comment: min-modA                             Pertinent Vitals/Pain Pain Assessment Pain Assessment: Faces Faces Pain Scale: Hurts even more Pain Location: LLE Pain Descriptors / Indicators: Grimacing Pain Intervention(s): Monitored during session    Home Living Family/patient expects to be discharged to:: Private residence Living Arrangements: Alone Available Help at Discharge: Family;Available 24 hours/day Type of Home: House (plans to discharge to her daughters home initially) Home Access: Stairs to enter Entrance Stairs-Rails: None Entrance Stairs-Number of Steps: 7   Home Layout: Able to live on main level with bedroom/bathroom Home Equipment:  Rolling Walker (2 wheels);Cane - single point;BSC/3in1;Shower seat      Prior Function Prior Level of Function : Independent/Modified Independent;Driving             Mobility Comments: ambulatory with PRN use of SPC, pt with a R glute min partial tear which has been more painful at times than  L hip OA       Extremity/Trunk Assessment   Upper Extremity Assessment Upper Extremity Assessment: Generalized weakness    Lower Extremity Assessment Lower Extremity Assessment: Generalized weakness    Cervical / Trunk Assessment Cervical / Trunk Assessment: Kyphotic  Communication   Communication Communication: No apparent difficulties Cueing Techniques: Verbal cues;Tactile cues  Cognition Arousal: Lethargic Behavior During Therapy: Flat affect Overall Cognitive Status: Impaired/Different from baseline Area of Impairment: Orientation, Attention, Memory, Following commands, Safety/judgement, Awareness, Problem solving                 Orientation Level: Disoriented to, Time (pt is unable to accurately report her birth year) Current Attention Level: Focused Memory: Decreased recall of precautions, Decreased short-term memory Following Commands: Follows one step commands inconsistently, Follows one step commands with increased time Safety/Judgement: Decreased awareness of safety, Decreased awareness of deficits Awareness: Intellectual Problem Solving: Slow processing, Decreased initiation, Requires verbal cues, Requires tactile cues          General Comments General comments (skin integrity, edema, etc.): VSS on RA, pt is very lethargic after receiving multiple pain medications earlier in the day. Pt with some stress incontinence during transfers but unable to volitionally urinate when on [facility] at this time.    Exercises     Assessment/Plan    PT Assessment Patient needs continued PT services  PT Problem List Decreased strength;Decreased activity tolerance;Decreased balance;Decreased mobility;Decreased knowledge of use of DME;Decreased safety awareness;Decreased knowledge of precautions;Pain       PT Treatment Interventions DME instruction;Gait training;Stair training;Functional mobility training;Therapeutic activities;Therapeutic exercise;Balance  training;Neuromuscular re-education;Patient/family education    PT Goals (Current goals can be found in the Care Plan section)  Acute Rehab PT Goals Patient Stated Goal: to return to independence PT Goal Formulation: With patient Time For Goal Achievement: 12/10/23 Potential to Achieve Goals: Fair    Frequency Min 1X/week     Co-evaluation               AM-PAC PT 6 Clicks Mobility  Outcome Measure Help needed turning from your back to your side while in a flat bed without using bedrails?: A Lot Help needed moving from lying on your back to sitting on the side of a flat bed without using bedrails?: A Lot Help needed moving to and from a bed to a chair (including a wheelchair)?: A Lot Help needed standing up from a chair using your arms (e.g., wheelchair or bedside chair)?: A Lot Help needed to walk in hospital room?: Total Help needed climbing 3-5 steps with a railing? : Total 6 Click Score: 10    End of Session Equipment Utilized During Treatment: Gait belt Activity Tolerance: Patient limited by lethargy Patient left: in bed;with call bell/phone within reach Nurse Communication: Mobility status PT Visit Diagnosis: Other abnormalities of gait and mobility (R26.89);Muscle weakness (generalized) (M62.81)    Time: 8440-8374 PT Time Calculation (min) (ACUTE ONLY): 26 min   Charges:   PT Evaluation $PT Eval Low Complexity: 1 Low   PT General Charges $$ ACUTE PT VISIT: 1 Visit         Bernardino JINNY Ruth, PT, DPT Acute Rehabilitation  Office [phone]   Bernardino JINNY Ruth 12/06/2023, 4:43 PM

## 2023-12-06 NOTE — Interval H&P Note (Signed)
History and Physical Interval Note: The patient understands that she is here today for a left total hip replacement to treat her significant left hip pain and arthritis.  There has been no acute or interval change in her medical status.  The risks and benefits of surgery have been discussed in detail and informed consent has been obtained.  The left operative hip has been marked.  12/06/2023 7:09 AM  Lauren [name]  has presented today for surgery, with the diagnosis of osteoarthritis left hip.  The various methods of treatment have been discussed with the patient and family. After consideration of risks, benefits and other options for treatment, the patient has consented to  Procedure(s): LEFT TOTAL HIP ARTHROPLASTY ANTERIOR APPROACH (Left) as a surgical intervention.  The patient's history has been reviewed, patient examined, no change in status, stable for surgery.  I have reviewed the patient's chart and labs.  Questions were answered to the patient's satisfaction.     Kathryne Hitch

## 2023-12-06 NOTE — Anesthesia Postprocedure Evaluation (Signed)
 Anesthesia Post Note  Patient: Lauren [name]  Procedure(s) Performed: LEFT TOTAL HIP ARTHROPLASTY ANTERIOR APPROACH (Left)     Patient location during evaluation: PACU Anesthesia Type: General Level of consciousness: awake and alert, oriented and patient cooperative Pain management: pain level controlled Vital Signs Assessment: post-procedure vital signs reviewed and stable Respiratory status: spontaneous breathing, nonlabored ventilation and respiratory function stable Cardiovascular status: blood pressure returned to baseline and stable Postop Assessment: no apparent nausea or vomiting Anesthetic complications: no   No notable events documented.  Last Vitals:  Vitals:   12/06/23 1030 12/06/23 1049  BP: (!) 108/56 (!) 117/45  Pulse: 70 71  Resp: 17 18  Temp:    SpO2: 99% 98%    Last Pain:  Vitals:   12/06/23 1100  TempSrc:   PainSc: 6                  Almarie CHRISTELLA Marchi

## 2023-12-06 NOTE — Discharge Instructions (Signed)
 Per [facility] clinic policy, our goal is ensure optimal postoperative pain control with a multimodal pain management strategy. For all [facility] patients, our goal is to wean post-operative narcotic medications by 6 weeks post-operatively. If this is not possible due to utilization of pain medication prior to surgery, your [facility] doctor will support your acute post-operative pain control for the first 6 weeks postoperatively, with a plan to transition you back to your primary pain team following that. Cyndia Skeeters will work to ensure a Therapist, occupational.  INSTRUCTIONS AFTER JOINT REPLACEMENT   Remove items at home which could result in a fall. This includes throw rugs or furniture in walking pathways ICE to the affected joint every three hours while awake for 30 minutes at a time, for at least the first 3-5 days, and then as needed for pain and swelling.  Continue to use ice for pain and swelling. You may notice swelling that will progress down to the foot and ankle.  This is normal after surgery.  Elevate your leg when you are not up walking on it.   Continue to use the breathing machine you got in the hospital (incentive spirometer) which will help keep your temperature down.  It is common for your temperature to cycle up and down following surgery, especially at night when you are not up moving around and exerting yourself.  The breathing machine keeps your lungs expanded and your temperature down.   DIET:  As you were doing prior to hospitalization, we recommend a well-balanced diet.  DRESSING / WOUND CARE / SHOWERING  Keep the surgical dressing until follow up.  The dressing is water proof, so you can shower without any extra covering.  IF THE DRESSING FALLS OFF or the wound gets wet inside, change the dressing with sterile gauze.  Please use good hand washing techniques before changing the dressing.  Do not use any lotions or creams on the incision until instructed by your surgeon.     ACTIVITY  Increase activity slowly as tolerated, but follow the weight bearing instructions below.   No driving for 6 weeks or until further direction given by your physician.  You cannot drive while taking narcotics.  No lifting or carrying greater than 10 lbs. until further directed by your surgeon. Avoid periods of inactivity such as sitting longer than an hour when not asleep. This helps prevent blood clots.  You may return to work once you are authorized by your doctor.     WEIGHT BEARING   Weight bearing as tolerated with assist device (walker, cane, etc) as directed, use it as long as suggested by your surgeon or therapist, typically at least 4-6 weeks.   EXERCISES  Results after joint replacement surgery are often greatly improved when you follow the exercise, range of motion and muscle strengthening exercises prescribed by your doctor. Safety measures are also important to protect the joint from further injury. Any time any of these exercises cause you to have increased pain or swelling, decrease what you are doing until you are comfortable again and then slowly increase them. If you have problems or questions, call your caregiver or physical therapist for advice.   Rehabilitation is important following a joint replacement. After just a few days of immobilization, the muscles of the leg can become weakened and shrink (atrophy).  These exercises are designed to build up the tone and strength of the thigh and leg muscles and to improve motion. Often times heat used for twenty to thirty minutes before  working out will loosen up your tissues and help with improving the range of motion but do not use heat for the first two weeks following surgery (sometimes heat can increase post-operative swelling).   These exercises can be done on a training (exercise) mat, on the floor, on a table or on a bed. Use whatever works the best and is most comfortable for you.    Use music or television  while you are exercising so that the exercises are a pleasant break in your day. This will make your life better with the exercises acting as a break in your routine that you can look forward to.   Perform all exercises about fifteen times, three times per day or as directed.  You should exercise both the operative leg and the other leg as well.  Exercises include:   Quad Sets - Tighten up the muscle on the front of the thigh (Quad) and hold for 5-10 seconds.   Straight Leg Raises - With your knee straight (if you were given a brace, keep it on), lift the leg to 60 degrees, hold for 3 seconds, and slowly lower the leg.  Perform this exercise against resistance later as your leg gets stronger.  Leg Slides: Lying on your back, slowly slide your foot toward your buttocks, bending your knee up off the floor (only go as far as is comfortable). Then slowly slide your foot back down until your leg is flat on the floor again.  Angel Wings: Lying on your back spread your legs to the side as far apart as you can without causing discomfort.  Hamstring Strength:  Lying on your back, push your heel against the floor with your leg straight by tightening up the muscles of your buttocks.  Repeat, but this time bend your knee to a comfortable angle, and push your heel against the floor.  You may put a pillow under the heel to make it more comfortable if necessary.   A rehabilitation program following joint replacement surgery can speed recovery and prevent re-injury in the future due to weakened muscles. Contact your doctor or a physical therapist for more information on knee rehabilitation.    CONSTIPATION  Constipation is defined medically as fewer than three stools per week and severe constipation as less than one stool per week.  Even if you have a regular bowel pattern at home, your normal regimen is likely to be disrupted due to multiple reasons following surgery.  Combination of anesthesia, postoperative  narcotics, change in appetite and fluid intake all can affect your bowels.   YOU MUST use at least one of the following options; they are listed in order of increasing strength to get the job done.  They are all available over the counter, and you may need to use some, POSSIBLY even all of these options:    Drink plenty of fluids (prune juice may be helpful) and high fiber foods Colace 100 mg by mouth twice a day  Senokot for constipation as directed and as needed Dulcolax (bisacodyl), take with full glass of water  Miralax (polyethylene glycol) once or twice a day as needed.  If you have tried all these things and are unable to have a bowel movement in the first 3-4 days after surgery call either your surgeon or your primary doctor.    If you experience loose stools or diarrhea, hold the medications until you stool forms back up.  If your symptoms do not get better within 1 week  or if they get worse, check with your doctor.  If you experience "the worst abdominal pain ever" or develop nausea or vomiting, please contact the office immediately for further recommendations for treatment.   ITCHING:  If you experience itching with your medications, try taking only a single pain pill, or even half a pain pill at a time.  You can also use Benadryl over the counter for itching or also to help with sleep.   TED HOSE STOCKINGS:  Use stockings on both legs until for at least 2 weeks or as directed by physician office. They may be removed at night for sleeping.  MEDICATIONS:  See your medication summary on the "After Visit Summary" that nursing will review with you.  You may have some home medications which will be placed on hold until you complete the course of blood thinner medication.  It is important for you to complete the blood thinner medication as prescribed.  PRECAUTIONS:  If you experience chest pain or shortness of breath - call 911 immediately for transfer to the hospital emergency department.    If you develop a fever greater that 101 F, purulent drainage from wound, increased redness or drainage from wound, foul odor from the wound/dressing, or calf pain - CONTACT YOUR SURGEON.                                                   FOLLOW-UP APPOINTMENTS:  If you do not already have a post-op appointment, please call the office for an appointment to be seen by your surgeon.  Guidelines for how soon to be seen are listed in your "After Visit Summary", but are typically between 1-4 weeks after surgery.  OTHER INSTRUCTIONS:   Knee Replacement:  Do not place pillow under knee, focus on keeping the knee straight while resting. CPM instructions: 0-90 degrees, 2 hours in the morning, 2 hours in the afternoon, and 2 hours in the evening. Place foam block, curve side up under heel at all times except when in CPM or when walking.  DO NOT modify, tear, cut, or change the foam block in any way.  POST-OPERATIVE OPIOID TAPER INSTRUCTIONS: It is important to wean off of your opioid medication as soon as possible. If you do not need pain medication after your surgery it is ok to stop day one. Opioids include: Codeine, Hydrocodone(Norco, Vicodin), Oxycodone(Percocet, oxycontin) and hydromorphone amongst others.  Long term and even short term use of opiods can cause: Increased pain response Dependence Constipation Depression Respiratory depression And more.  Withdrawal symptoms can include Flu like symptoms Nausea, vomiting And more Techniques to manage these symptoms Hydrate well Eat regular healthy meals Stay active Use relaxation techniques(deep breathing, meditating, yoga) Do Not substitute Alcohol to help with tapering If you have been on opioids for less than two weeks and do not have pain than it is ok to stop all together.  Plan to wean off of opioids This plan should start within one week post op of your joint replacement. Maintain the same interval or time between taking each dose  and first decrease the dose.  Cut the total daily intake of opioids by one tablet each day Next start to increase the time between doses. The last dose that should be eliminated is the evening dose.   MAKE SURE YOU:  Understand these instructions.  Get help right away if you are not doing well or get worse.    Thank you for letting us be a part of your medical care team.  It is a privilege we respect greatly.  We hope these instructions will help you stay on track for a fast and full recovery!

## 2023-12-06 NOTE — Op Note (Signed)
 Operative Note  Date of operation: 12/06/2023 Preoperative diagnosis: Left hip primary osteoarthritis Postoperative diagnosis: Same  Procedure: Left direct anterior total hip arthroplasty  Implants: Implant Name Type Inv. Item Serial No. Manufacturer Lot No. LRB No. Used Action  CUP SECTOR GRIPTON - [id number] Cup CUP SECTOR GRIPTON  [employer] [id number] Left 1 Implanted  PINNACLE PLUS 4 NEUTRAL - [id number] Hips PINNACLE PLUS 4 NEUTRAL  [employer]PU[employer]TH[employer] [id number] Left 1 Implanted  HEAD FEMORAL 32 CERAMIC - [id number] Hips HEAD FEMORAL 32 CERAMIC  [employer] [id number] Left 1 Implanted  CORAIL OFFSET COLLAR SZ 11 HIP - [id number] Stem CORAIL OFFSET COLLAR SZ 11 HIP  [employer] [id number] Left 1 Implanted   Surgeon: Lonni GRADE. Vernetta, MD Assistant: Tory Gaskins, PA-C  Anesthesia: #1 attempted spinal, #2 General EBL: 450 cc Antibiotics: IV Ancef  Complications: None  Indications: The patient is a 78 year old female well-known to us .  We have replaced her right hip and both her knees.  She developed left hip pain over the last year and an MRI showed significant arthritis of her left hip.  The plain films are normal but over time her hip is gotten significantly worse.  At this point we recommended a total hip arthroplasty based on her radiographic findings and clinical exam findings.  She has had steroid injections in the hip joint as well which is temporize the pain.  At this point her left hip pain is daily and it is detrimentally affecting her mobility, her quality of life and her activities of daily living.  She does wish to proceed with a hip replacement we agree with this as well.  We talked about the risks of acute blood loss anemia, nerve vessel injury, fracture, infection, DVT, implant failure, dislocation, leg length differences and wound healing issues.  She understands that our goals are hopefully decreased pain, improved mobility and improved quality of  life.  Procedure description: After informed consent was obtained and the appropriate left hip was marked, the patient was brought to the operating room and set up on the stretcher where spinal anesthesia was attempted but was not successful.  She was then laid in supine position on the stretcher and general anesthesia was obtained.  We were able to assess her leg lengths and she is definitely shorter on the left than the right.  Traction boots were placed on both her feet and neck she was placed supine on the Hana fracture table with a perineal post in place in both legs and inline skeletal traction devices but no traction applied.  Her left operative hip and pelvis were assessed radiographically.  The left hip was prepped and draped in DuraPrep and sterile drapes.  A timeout was called and she was identified as the correct patient and the correct left hip.  An incision was then made just inferior and posterior to the ASIS and carried slightly obliquely down the leg.  Dissection was carried down to the tensor fascia lata muscle and the tensor fascia was then divided longitudinally to proceed with a direct anterior process the hip.  Circumflex vessels were identified and cauterized.  The hip capsule was identified and opened up in L-type format a very large joint effusion.  Cobra retractors were placed around the medial and lateral femoral neck and a femoral neck cut was made with an oscillating saw just proximal to the lesser trochanter.  This cut was completed with an osteotome.  A corkscrew guide was placed in the femoral head and  the femoral head was removed its entirety and was definitely devoid of cartilage with deformity of the femoral head.  A bent Hohmann was then placed over the medial acetabular rim and remnants of the acetabular labrum and other debris removed.  Reaming was then initiated from a size 43 reamer and stepwise up to a size 49 reamer with all reamers placed under direct visualization and  the last reamer also placed under direct fluoroscopy in order to obtain the depth of reaming, the inclination and the anteversion.  the real depuy sector gription astra component size 50 was then placed without difficulty followed by a 32+4 polythene liner.  attention was then turned to the femur.  With the left leg externally rotated to 120 degrees, extended and adduction, annular retractor was placed medially and Hohmann retractor was placed behind the greater trochanter.  The lateral joint capsule was released and a box cutting osteotome was used in the femoral canal.  Broaching was then initiated using the Corail broaching system from a size 8 going to a size 11.  With a size 11 in place we trialed a standard offset femoral neck and a 32+1 trial head ball.  We brought the leg over and up and with traction and internal rotation reduced in the pelvis.  Based on radiographic studies assessment we felt like we needed just a little more offset.  We dislocated the hip and removed the trial components.  We then placed the real Corail femoral component size 11 with high offset and went with a real 32+1 ceramic head ball.  Again this reduced into the acetabulum and we are pleased with stability as well as offset and leg length assessed radiographically and clinically.  The soft tissue was then irrigated with normal saline solution.  The joint capsule was closed with interrupted #1 Ethibond suture followed by #1 Vicryl close the tensor fascia.  0 Vicryl was used to close deep tissue and 2-0 Vicryl was used for subcutaneous tissue.  The skin was closed with staples.  An Aquacel dressing was applied.  The patient was taken off the Hana table and awakened, extubated and taken recovery room.  Tory Gaskins, PA-C did assist during entire case from beginning the end and his assistance was crucial and medically necessary for soft tissue management and retraction, helping guide implant placement and a layered closure of the wound.

## 2023-12-07 ENCOUNTER — Encounter (HOSPITAL_COMMUNITY): Payer: Self-pay | Admitting: Orthopaedic Surgery

## 2023-12-07 DIAGNOSIS — M1612 Unilateral primary osteoarthritis, left hip: Secondary | ICD-10-CM | POA: Diagnosis not present

## 2023-12-07 LAB — GLUCOSE, CAPILLARY: Glucose-Capillary: 100 mg/dL — ABNORMAL HIGH (ref 70–99)

## 2023-12-07 LAB — BASIC METABOLIC PANEL
Anion gap: 10 (ref 5–15)
BUN: 15 mg/dL (ref 8–23)
CO2: 25 mmol/L (ref 22–32)
Calcium: 8.7 mg/dL — ABNORMAL LOW (ref 8.9–10.3)
Chloride: 95 mmol/L — ABNORMAL LOW (ref 98–111)
Creatinine, Ser: 0.97 mg/dL (ref 0.44–1.00)
GFR, Estimated: 60 mL/min — ABNORMAL LOW (ref >60)
Glucose, Bld: 118 mg/dL — ABNORMAL HIGH (ref 70–99)
Potassium: 3.4 mmol/L — ABNORMAL LOW (ref 3.5–5.1)
Sodium: 130 mmol/L — ABNORMAL LOW (ref 135–145)

## 2023-12-07 MED ORDER — HYDROCODONE-ACETAMINOPHEN 5-325 MG PO TABS
1.0000 | ORAL_TABLET | Freq: Four times a day (QID) | ORAL | Status: DC | PRN
  Administered 2023-12-07 (×2): 2 via ORAL
  Filled 2023-12-07: qty 2
  Filled 2023-12-07 (×2): qty 1

## 2023-12-07 NOTE — Plan of Care (Signed)
 Pt doing well. Pt and daughter given D/C instructions with verbal understanding. Incision is clean and dry with no sign of infection. Pt's IV was removed prior to D/C. Pt D/C'd home via wheelchair per MD order. Pt is stable @ D/C and has no other needs at this time. Rosina Rakers, RN

## 2023-12-07 NOTE — Progress Notes (Signed)
 Physical Therapy Treatment  Patient Details Name: Lauren [name] MRN: [id number] DOB: 1945/11/08 Today's Date: 12/07/2023   History of Present Illness Pt is a 78 y.o. female presents to [facility] hospital on 12/06/2023 for elective L THA. PMH includes HTN, hiatal hernia, breast CA, DMII, CAD.    PT Comments  Pt progressing towards physical therapy goals. Focus of session was stair training, education for car transfer, and review of HEP prior to d/c. Pt complaining of increased pain at incision site due to dressing pulling at skin fold. RN present to change dressing however pt reports no change in discomfort. Pt and daughter report no further questions and pt is safe for d/c home with family support from a PT standpoint. Will continue to follow until d/c.    If plan is discharge home, recommend the following: A lot of help with walking and/or transfers;A lot of help with bathing/dressing/bathroom;Assistance with cooking/housework;Assist for transportation;Help with stairs or ramp for entrance   Can travel by private vehicle        Equipment Recommendations  None recommended by PT    Recommendations for Other Services       Precautions / Restrictions Precautions Precautions: Fall Precaution Comments: direct anterior THA Restrictions Weight Bearing Restrictions Per Provider Order: Yes LLE Weight Bearing Per Provider Order: Weight bearing as tolerated     Mobility  Bed Mobility Overal bed mobility: Needs Assistance Bed Mobility: Supine to Sit, Sit to Supine     Supine to sit: Min assist Sit to supine: Min assist   General bed mobility comments: Assist for LE advancement towards EOB. Moderate railing use to elevate trunk to full sitting position.    Transfers Overall transfer level: Needs assistance Equipment used: Rolling walker (2 wheels), 1 person hand held assist Transfers: Sit to/from Stand, Bed to chair/wheelchair/BSC Sit to Stand: Contact guard assist   Step pivot  transfers: Contact guard assist       General transfer comment: Pt demonstrating proper hand placement on seated surface for safety.    Ambulation/Gait Ambulation/Gait assistance: Contact guard assist Gait Distance (Feet): 175 Feet Assistive device: Rolling walker (2 wheels) Gait Pattern/deviations: Step-through pattern, Decreased stride length, Trunk flexed Gait velocity: Decreased Gait velocity interpretation: <1.31 ft/sec, indicative of household ambulator   General Gait Details: VC's for improved heel strike, closer walker proximity and forward gaze. No assist required but hands on guarding provided throughout.   Stairs Stairs: Yes Stairs assistance: Min assist Stair Management: One rail Left, Step to pattern, Forwards Number of Stairs: 4 General stair comments: VC's for sequencing and general safety.   Wheelchair Mobility     Tilt Bed    Modified Rankin (Stroke Patients Only)       Balance Overall balance assessment: Needs assistance Sitting-balance support: No upper extremity supported, Feet supported Sitting balance-[name]: Poor Sitting balance - Comments: minA Postural control: Posterior lean Standing balance support: Bilateral upper extremity supported, Reliant on assistive device for balance, No upper extremity supported Standing balance-[name]: Poor Standing balance comment: min-modA                            Cognition Arousal: Alert Behavior During Therapy: WFL for tasks assessed/performed Overall Cognitive Status: Within Functional Limits for tasks assessed  Exercises Total Joint Exercises Ankle Circles/Pumps: 10 reps Quad Sets: 10 reps Short Arc Quad: 10 reps Heel Slides: 10 reps Hip ABduction/ADduction: 10 reps Long Arc Quad: 10 reps    General Comments        Pertinent Vitals/Pain Pain Assessment Pain Assessment: Faces Faces Pain Scale: Hurts even  more Pain Location: LLE Pain Descriptors / Indicators: Grimacing Pain Intervention(s): Limited activity within patient's tolerance, Monitored during session, Repositioned    Home Living                          Prior Function            PT Goals (current goals can now be found in the care plan section) Acute Rehab PT Goals Patient Stated Goal: to return to independence PT Goal Formulation: With patient Time For Goal Achievement: 12/10/23 Potential to Achieve Goals: Fair Progress towards PT goals: Progressing toward goals    Frequency    Min 1X/week      PT Plan      Co-evaluation              AM-PAC PT 6 Clicks Mobility   Outcome Measure  Help needed turning from your back to your side while in a flat bed without using bedrails?: A Little Help needed moving from lying on your back to sitting on the side of a flat bed without using bedrails?: A Little Help needed moving to and from a bed to a chair (including a wheelchair)?: A Little Help needed standing up from a chair using your arms (e.g., wheelchair or bedside chair)?: A Little Help needed to walk in hospital room?: A Little Help needed climbing 3-5 steps with a railing? : A Little 6 Click Score: 18    End of Session Equipment Utilized During Treatment: Gait belt Activity Tolerance: Patient limited by lethargy Patient left: in bed;with call bell/phone within reach Nurse Communication: Mobility status PT Visit Diagnosis: Other abnormalities of gait and mobility (R26.89);Muscle weakness (generalized) (M62.81)     Time: 8598-8577 PT Time Calculation (min) (ACUTE ONLY): 21 min  Charges:    $Gait Training: 8-22 mins $Therapeutic Exercise: 8-22 mins PT General Charges $$ ACUTE PT VISIT: 1 Visit                     Lauren [name], PT, DPT Acute Rehabilitation Services Secure Chat Preferred Office: [phone]    Lauren [name] 12/07/2023, 3:08 PM

## 2023-12-07 NOTE — Progress Notes (Signed)
 Subjective: 1 Day Post-Op Procedure(s) (LRB): LEFT TOTAL HIP ARTHROPLASTY ANTERIOR APPROACH (Left) Patient reports pain as moderate.  She has ambulated some and is doing well.  Objective: Vital signs in last 24 hours: Temp:  [97.5 F (36.4 C)-98.8 F (37.1 C)] 97.5 F (36.4 C) (02/05 0414) Pulse Rate:  [64-100] 72 (02/05 0604) Resp:  [12-18] 18 (02/05 0414) BP: (94-130)/(45-96) 97/46 (02/05 0604) SpO2:  [93 %-100 %] 93 % (02/05 0604)  Intake/Output from previous day: 02/04 0701 - 02/05 0700 In: 1380 [P.O.:480; I.V.:700; IV Piggyback:200] Out: 450 [Blood:450] Intake/Output this shift: No intake/output data recorded.  No results for input(s): HGB in the last 72 hours. No results for input(s): WBC, RBC, HCT, PLT in the last 72 hours. No results for input(s): NA, K, CL, CO2, BUN, CREATININE, GLUCOSE, CALCIUM  in the last 72 hours. No results for input(s): LABPT, INR in the last 72 hours.  Sensation intact distally Intact pulses distally Dorsiflexion/Plantar flexion intact Incision: scant drainage   Assessment/Plan: 1 Day Post-Op Procedure(s) (LRB): LEFT TOTAL HIP ARTHROPLASTY ANTERIOR APPROACH (Left) Up with therapy Discharge home with home health today after therapy works with her.      Lonni CINDERELLA Poli 12/07/2023, 7:38 AM

## 2023-12-07 NOTE — Care Plan (Signed)
 Ortho Bundle Case Management Note  Patient Details  Name: Lauren [name] MRN: [id number] Date of Birth: [birth year]   [facility] RNCM call to patient to discuss her upcoming Left total hip replacement with Dr. Vernetta on 12/06/23 at [facility]. She is agreeable to case management. She lives alone, but will be going to her daughter's home for at least the first week after surgery to recover. She has a RW. Anticipate HHPT will be needed after a short hospital stay. Referral made to [facility] after choice provided. Reviewed post op instructions and answered questions. Will continue to follow for case management needs.                   DME Arranged:   (Patient has all DME needed due to having prior Orthopedic surgery.) DME Agency:     HH Arranged:  PT HH Agency:  [facility]  Additional Comments: Please contact me with any questions of if this plan should need to change.  Tylene Ned, RN, BSN, [employer]  [phone] 12/07/2023, 8:41 AM

## 2023-12-07 NOTE — Discharge Summary (Signed)
 Patient ID: Lauren [name] MRN: [id number] DOB/AGE: [birth year] 79 y.o.  Admit date: 12/06/2023 Discharge date: 12/07/2023  Admission Diagnoses:  Principal Problem:   Unilateral primary osteoarthritis, left hip Active Problems:   Status post total replacement of left hip   Discharge Diagnoses:  Same  Past Medical History:  Diagnosis Date   Anxiety    Arthritis    Back, Hip- right   Atypical chest pain 06/25/2021   Back pain    Cancer (HCC)    right breast   Cataract    Colitis    Coronary artery calcification 02/20/2020   Diabetes mellitus without complication (HCC)    Type II   Edema 03/05/2021   Edema of both lower extremities    Endometriosis    Family history of adverse reaction to anesthesia    Father - N/V - blood pressure; daughter N/V   Family history of breast cancer    Family history of colon cancer    Family history of kidney cancer    Family history of prostate cancer    Fatty liver    GERD (gastroesophageal reflux disease)    History of hiatal hernia    History of kidney stones    History of ulcerative colitis    Hypertension    Insomnia    Joint pain    Kidney problem    Lower extremity edema 03/05/2021   Lumbar disc disease    L4 L5   Neuropathy    Osteoarthritis    Other hyperlipidemia    Palpitations    Personal history of radiation therapy    Pneumonia    1983, 2003   Pure hypercholesterolemia 02/20/2020   PVCs (premature ventricular contractions)    benign   Umbilical hernia    Vitamin D  deficiency    Wears glasses     Surgeries: Procedure(s): LEFT TOTAL HIP ARTHROPLASTY ANTERIOR APPROACH on 12/06/2023   Consultants:   Discharged Condition: Improved  Hospital Course: Lauren [name] is an 78 y.o. female who was admitted 12/06/2023 for operative treatment ofUnilateral primary osteoarthritis, left hip. Patient has severe unremitting pain that affects sleep, daily activities, and work/hobbies. After pre-op clearance the patient  was taken to the operating room on 12/06/2023 and underwent  Procedure(s): LEFT TOTAL HIP ARTHROPLASTY ANTERIOR APPROACH.    Patient was given perioperative antibiotics:  Anti-infectives (From admission, onward)    Start     Dose/Rate Route Frequency Ordered Stop   12/06/23 0615  ceFAZolin  (ANCEF ) IVPB 2g/100 mL premix        2 g 200 mL/hr over 30 Minutes Intravenous On call to O.R. 12/06/23 0600 12/06/23 0805        Patient was given sequential compression devices, early ambulation, and chemoprophylaxis to prevent DVT.  Patient benefited maximally from hospital stay and there were no complications.    Recent vital signs: Patient Vitals for the past 24 hrs:  BP Temp Temp src Pulse Resp SpO2  12/07/23 0604 (!) 97/46 -- -- 72 -- 93 %  12/07/23 0414 (!) 94/54 (!) 97.5 F (36.4 C) Oral 70 18 97 %  12/07/23 0011 (!) 98/54 98.4 F (36.9 C) Oral 76 18 100 %  12/06/23 2031 (!) 109/54 98.8 F (37.1 C) Oral 77 18 96 %  12/06/23 1549 121/63 -- -- 100 16 97 %     Recent laboratory studies:  Recent Labs    12/07/23 0736  NA 130*  K 3.4*  CL 95*  CO2 25  BUN 15  CREATININE 0.97  GLUCOSE 118*  CALCIUM  8.7*     Discharge Medications:   Allergies as of 12/07/2023       Reactions   Ciprofloxacin Hcl Hives   Percocet [oxycodone -acetaminophen ] Other (See Comments)   RESPIRATORY DISTRESS   Penicillins Hives, Other (See Comments)   Has patient had a PCN reaction causing immediate rash, facial/tongue/throat swelling, SOB or lightheadedness with hypotension: no Has patient had a PCN reaction causing severe rash involving mucus membranes or skin necrosis: no Has patient had a PCN reaction that required hospitalization no Has patient had a PCN reaction occurring within the last 10 years: no If all of the above answers are NO, then may proceed with Cephalosporin use.   Sulfa Antibiotics Hives   Tape Other (See Comments)   Adhesives - Causes Blisters. No band aids, and plastic tape     Tetanus Toxoids Hives, Rash   Jardiance [empagliflozin]    Severe UTI        Medication List     TAKE these medications    aspirin  EC 81 MG tablet Take 81 mg by mouth daily. Swallow whole.   B-complex with vitamin C tablet Take 1 tablet by mouth daily.   chlorthalidone  25 MG tablet Commonly known as: HYGROTON  TAKE ONE TABLET BY MOUTH ONCE DAILY   escitalopram  20 MG tablet Commonly known as: Lexapro  Take 1 tablet (20 mg total) by mouth daily.   FIBER GUMMIES PO Take 1-2 each by mouth See admin instructions. Take 2 gummies with breakfast and 1 gummy with dinner   fluticasone  50 MCG/ACT nasal spray Commonly known as: FLONASE  Place 2 sprays into both nostrils daily as needed for allergies or rhinitis.   gabapentin  300 MG capsule Commonly known as: NEURONTIN  Take 600 mg by mouth at bedtime.   HYDROcodone -acetaminophen  10-325 MG tablet Commonly known as: NORCO Take 1 tablet by mouth every 6 (six) hours as needed for moderate pain (pain score 4-6).   lidocaine  4 % cream Commonly known as: LMX Apply 1 application topically daily as needed (for joint pain).   Magnesium  250 MG Tabs Take 250 mg by mouth daily.   methocarbamol  500 MG tablet Commonly known as: ROBAXIN  Take 1 tablet (500 mg total) by mouth every 6 (six) hours as needed for muscle spasms. What changed: when to take this   OneTouch Delica Plus Lancet33G Misc   OneTouch Verio test strip Generic drug: glucose blood   pantoprazole  40 MG tablet Commonly known as: PROTONIX  Take 40 mg by mouth 2 (two) times daily.   phenylephrine  0.25 % nasal spray Commonly known as: NEO-SYNEPHRINE Place 1 spray into both nostrils at bedtime as needed for congestion.   rosuvastatin  20 MG tablet Commonly known as: CRESTOR  Take 20 mg by mouth daily.   tirzepatide  5 MG/0.5ML Pen Commonly known as: MOUNJARO  Inject 5 mg into the skin once a week.   Vitamin D  (Ergocalciferol ) 1.25 MG (50000 UNIT) Caps capsule Commonly  known as: DRISDOL  Take 1 capsule (50,000 Units total) by mouth every 14 (fourteen) days.               Durable Medical Equipment  (From admission, onward)           Start     Ordered   12/06/23 1055  DME 3 n 1  Once        12/06/23 1054   12/06/23 1055  DME Walker rolling  Once       Question Answer Comment  Walker: With  5 Inch Wheels   Patient needs a walker to treat with the following condition Status post total replacement of left hip      12/06/23 1054            Diagnostic Studies: DG Pelvis Portable Result Date: 12/06/2023 CLINICAL DATA:  Postop left hip arthroplasty. EXAM: PORTABLE PELVIS 1-2 VIEWS COMPARISON:  None Available. FINDINGS: Left hip arthroplasty in expected alignment. No periprosthetic lucency or fracture. Recent postsurgical change includes air and edema in the soft tissues. Lateral skin staples in place. A right hip arthroplasty. IMPRESSION: Left hip arthroplasty without immediate postoperative complication. Electronically Signed   By: Andrea Gasman M.D.   On: 12/06/2023 10:14   DG HIP UNILAT WITH PELVIS 1V LEFT Result Date: 12/06/2023 CLINICAL DATA:  Elective surgery.  Left hip replacement. EXAM: DG HIP (WITH OR WITHOUT PELVIS) 1V*L* COMPARISON:  None Available. FINDINGS: Three fluoroscopic spot views of the pelvis and left hip obtained in the operating room. Images during hip arthroplasty. Fluoroscopy time 13 seconds. Dose 1.96 mGy. IMPRESSION: Intraoperative fluoroscopy during left hip arthroplasty. Electronically Signed   By: Andrea Gasman M.D.   On: 12/06/2023 10:14   DG C-Arm 1-60 Min-No Report Result Date: 12/06/2023 Fluoroscopy was utilized by the requesting physician.  No radiographic interpretation.    Disposition: Discharge disposition: 01-Home or Self Care          Follow-up Information     [facility] Follow up.   Specialty: Home Health Services Why: The home health agency will contact you for the first home  visit. Contact information: [address] [place] [address] [phone]         Vernetta Lonni GRADE, MD. Go on 12/19/2023.   Specialty: Orthopedic Surgery Why: at 10:15 am for your first in office visit with Dr. Vernetta post-operatively. Contact information: [address]  [place] [address] [phone]                  Signed: Lonni GRADE Vernetta 12/07/2023, 3:25 PM

## 2023-12-07 NOTE — Care Management Obs Status (Signed)
MEDICARE OBSERVATION STATUS NOTIFICATION   Patient Details  Name: Lauren [name] MRN: [id number] Date of Birth: 1946/02/22   Medicare Observation Status Notification Given:  Yes    Kermit Balo, RN 12/07/2023, 8:54 AM

## 2023-12-07 NOTE — Progress Notes (Signed)
 Physical Therapy Treatment  Patient Details Name: Lauren [name] MRN: [id number] DOB: Jan 19, 1946 Today's Date: 12/07/2023   History of Present Illness Pt is a 78 y.o. female presents to [facility] hospital on 12/06/2023 for elective L THA. PMH includes HTN, hiatal hernia, breast CA, DMII, CAD.    PT Comments  Pt progressing towards physical therapy goals. Was able to perform transfers and ambulation with min assist to CGA and RW for support. Plan for second session in PM for stair training and to progress gait training prior to d/c. Will continue to follow and progress as able per POC.     If plan is discharge home, recommend the following: A lot of help with walking and/or transfers;A lot of help with bathing/dressing/bathroom;Assistance with cooking/housework;Assist for transportation;Help with stairs or ramp for entrance   Can travel by private vehicle        Equipment Recommendations  None recommended by PT    Recommendations for Other Services       Precautions / Restrictions Precautions Precautions: Fall Precaution Comments: direct anterior THA Restrictions Weight Bearing Restrictions Per Provider Order: Yes LLE Weight Bearing Per Provider Order: Weight bearing as tolerated     Mobility  Bed Mobility               General bed mobility comments: Pt was received sitting up EOB    Transfers Overall transfer level: Needs assistance Equipment used: Rolling walker (2 wheels), 1 person hand held assist Transfers: Sit to/from Stand, Bed to chair/wheelchair/BSC Sit to Stand: Min assist   Step pivot transfers: Contact guard assist       General transfer comment: VC's for hand placement on seated surface for safety. Light assist to power up to full stand and gain/maintain standing balance.    Ambulation/Gait Ambulation/Gait assistance: Contact guard assist Gait Distance (Feet): 250 Feet Assistive device: Rolling walker (2 wheels) Gait Pattern/deviations: Step-through  pattern, Decreased stride length, Trunk flexed Gait velocity: Decreased Gait velocity interpretation: <1.31 ft/sec, indicative of household ambulator   General Gait Details: VC's for improved heel strike, closer walker proximity and forward gaze. No assist required but hands on guarding provided throughout.   Stairs             Wheelchair Mobility     Tilt Bed    Modified Rankin (Stroke Patients Only)       Balance Overall balance assessment: Needs assistance Sitting-balance support: No upper extremity supported, Feet supported Sitting balance-[name]: Poor Sitting balance - Comments: minA Postural control: Posterior lean Standing balance support: Bilateral upper extremity supported Standing balance-[name]: Poor Standing balance comment: min-modA                            Cognition Arousal: Alert Behavior During Therapy: WFL for tasks assessed/performed Overall Cognitive Status: Within Functional Limits for tasks assessed                                          Exercises Total Joint Exercises Ankle Circles/Pumps: 10 reps Quad Sets: 10 reps Short Arc Quad: 10 reps Heel Slides: 10 reps Hip ABduction/ADduction: 10 reps Long Arc Quad: 10 reps    General Comments        Pertinent Vitals/Pain Pain Assessment Pain Assessment: Faces Faces Pain Scale: Hurts even more Pain Location: LLE Pain Descriptors / Indicators: Grimacing Pain  Intervention(s): Limited activity within patient's tolerance, Monitored during session, Repositioned    Home Living                          Prior Function            PT Goals (current goals can now be found in the care plan section) Acute Rehab PT Goals Patient Stated Goal: to return to independence PT Goal Formulation: With patient Time For Goal Achievement: 12/10/23 Potential to Achieve Goals: Fair Progress towards PT goals: Progressing toward goals    Frequency     Min 1X/week      PT Plan      Co-evaluation              AM-PAC PT 6 Clicks Mobility   Outcome Measure  Help needed turning from your back to your side while in a flat bed without using bedrails?: A Little Help needed moving from lying on your back to sitting on the side of a flat bed without using bedrails?: A Little Help needed moving to and from a bed to a chair (including a wheelchair)?: A Little Help needed standing up from a chair using your arms (e.g., wheelchair or bedside chair)?: A Little Help needed to walk in hospital room?: A Little Help needed climbing 3-5 steps with a railing? : A Little 6 Click Score: 18    End of Session Equipment Utilized During Treatment: Gait belt Activity Tolerance: Patient limited by lethargy Patient left: in bed;with call bell/phone within reach Nurse Communication: Mobility status PT Visit Diagnosis: Other abnormalities of gait and mobility (R26.89);Muscle weakness (generalized) (M62.81)     Time: 9149-9069 PT Time Calculation (min) (ACUTE ONLY): 40 min  Charges:    $Gait Training: 23-37 mins $Therapeutic Exercise: 8-22 mins PT General Charges $$ ACUTE PT VISIT: 1 Visit                     Leita Sable, PT, DPT Acute Rehabilitation Services Secure Chat Preferred Office: [phone]    [name] 12/07/2023, 1:11 PM

## 2023-12-19 ENCOUNTER — Encounter: Payer: Self-pay | Admitting: Orthopaedic Surgery

## 2023-12-19 ENCOUNTER — Ambulatory Visit (INDEPENDENT_AMBULATORY_CARE_PROVIDER_SITE_OTHER): Payer: PPO | Admitting: Orthopaedic Surgery

## 2023-12-19 DIAGNOSIS — Z96642 Presence of left artificial hip joint: Secondary | ICD-10-CM

## 2023-12-19 NOTE — Progress Notes (Signed)
 The patient is here for first postoperative visit status post a left total hip replacement.  She has had her knees replaced and her right hip replaced.  She has been compliant with a baby aspirin twice a day.  On exam her incision looks good.  The staples are been removed and Steri-Strips applied.  She does have a moderate seroma like she had postoperative on the right hip.  I was able to aspirate 90 cc of fluid from the soft tissue.  She knows that if it reaccumulate's we can always aspirate this again.  She does not need to perform any type of exercises for her left hip.  I want her to work on just her balance and coordination.  We will see her back in 4 weeks for repeat exam but no x-rays are needed.  Obviously she can be seen sooner if she needs the seroma reaspirated if it accumulates again.

## 2024-01-01 ENCOUNTER — Encounter (INDEPENDENT_AMBULATORY_CARE_PROVIDER_SITE_OTHER): Payer: Self-pay | Admitting: Family Medicine

## 2024-01-02 ENCOUNTER — Ambulatory Visit (INDEPENDENT_AMBULATORY_CARE_PROVIDER_SITE_OTHER): Payer: PPO | Admitting: Physician Assistant

## 2024-01-02 ENCOUNTER — Encounter: Payer: Self-pay | Admitting: Physician Assistant

## 2024-01-02 DIAGNOSIS — M25552 Pain in left hip: Secondary | ICD-10-CM

## 2024-01-02 DIAGNOSIS — Z96642 Presence of left artificial hip joint: Secondary | ICD-10-CM | POA: Diagnosis not present

## 2024-01-02 NOTE — Progress Notes (Signed)
 Office Visit Note   Patient: Lauren [name]           Date of Birth: 11-30-1945           MRN: [id number] Visit Date: 01/02/2024              Requested by: Daisy Floro, MD [address] [place],  [place] [address] PCP: Daisy Floro, MD  Chief Complaint  Patient presents with  . Left Hip - Pain      HPI: [name] is a pleasant 78 year old woman who is about a month status post left total hip arthroplasty with Dr. Magnus Ivan.  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  He did aspirate 90 cc initially from her hip.  She has had reaccumulation of some of the seroma and is requesting it be drained  Assessment & Plan: Visit Diagnoses: Status post left total hip arthroplasty  Plan: 30 cc of blood-tinged serous fluid was aspirated today without difficulty patient may follow-up with Dr. Magnus Ivan she felt much better  Follow-Up Instructions: With Dr. Lanny Cramp Exam  Patient is alert, oriented, no adenopathy, well-dressed, normal affect, normal respiratory effort. Examination she is neurovascular intact she has a well-healed surgical incision final 2 proximal Steri-Strips were removed.  No redness no erythema she does have a palpable seroma  Imaging: No results found. No images are attached to the encounter.  Labs: Lab Results  Component Value Date   HGBA1C 6.0 (H) 06/08/2023   HGBA1C 6.5 (H) 03/23/2023   HGBA1C 6.1 (H) 07/23/2022   LABORGA ESCHERICHIA COLI 11/05/2015     Lab Results  Component Value Date   ALBUMIN 4.3 06/08/2023   ALBUMIN 4.4 06/08/2022   ALBUMIN 4.4 05/11/2022    No results found for: "MG" Lab Results  Component Value Date   VD25OH 124.0 (H) 09/22/2023   VD25OH 100.0 06/08/2023   VD25OH 55.8 12/01/2022    No results found for: "PREALBUMIN"    Latest Ref Rng & Units 11/30/2023    1:47 PM 08/04/2022    3:31 AM 07/23/2022    9:36 AM  CBC EXTENDED  WBC 4.0 - 10.5 K/uL 10.9  7.8  6.4   RBC 3.87 - 5.11 MIL/uL 4.25  3.14  3.96    Hemoglobin 12.0 - 15.0 g/dL 29.5  9.5  62.1   HCT 30.8 - 46.0 % 37.5  27.9  35.5   Platelets 150 - 400 K/uL 327  205  270      There is no height or weight on file to calculate BMI.  Orders:  No orders of the defined types were placed in this encounter.  No orders of the defined types were placed in this encounter.    Procedures: Large Joint Inj on 01/02/2024 3:25 PM Indications: pain and diagnostic evaluation Details: 18 G 1.5 in needle, lateral approach  Arthrogram: No  Outcome: tolerated well, no immediate complications  After verbal consent was obtained lateral side of the hip just inferior to the incision was prepped with alcohol x 2 and Betadine.  18-gauge needle was inserted on a 30 cc syringe.  30 cc of blood-tinged serous view was aspirated patient had significant relief in symptoms Band-Aid was applied Procedure, treatment alternatives, risks and benefits explained, specific risks discussed. Consent was given by the patient. Immediately prior to procedure a time out was called to verify the correct patient, procedure, equipment, support staff and site/side marked as required. Patient was prepped and draped in the usual  sterile fashion.    Clinical Data: No additional findings.  ROS:  All other systems negative, except as noted in the HPI. Review of Systems  Objective: Vital Signs: There were no vitals taken for this visit.  Specialty Comments:  No specialty comments available.  PMFS History: Patient Active Problem List   Diagnosis Date Noted  . 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 Stress incontinence 11/22/2023  . Restless leg syndrome 09/22/2023  . Other hyperlipidemia 06/08/2023  . Unilateral primary osteoarthritis, left hip 05/11/2023  . Depression 03/16/2023  . Allergic rhinitis 12/29/2022  . Seasonal affective disorder (HCC) 12/01/2022  . BMI 32.0-32.9,adult 12/01/2022  . Obesity, Beginning BMI 37.59 12/01/2022  . OA  (osteoarthritis) of knee 08/03/2022  . Status post total right knee replacement 08/03/2022  . Stress 07/19/2022  . Other constipation 07/19/2022  . Atypical chest pain 06/25/2021  . Class 2 severe obesity with serious comorbidity and body mass index (BMI) of 37.0 to 37.9 in adult [facility]) 06/03/2021  . Vitamin D deficiency 06/03/2021  . Other fatigue 04/22/2021  . SOB (shortness of breath) on exertion 04/22/2021  . Lower extremity edema 03/05/2021  . Coronary artery calcification 02/20/2020  . Pure hypercholesterolemia 02/20/2020  . Unilateral primary osteoarthritis, right knee 01/03/2019  . Genetic testing 12/07/2018  . Family history of breast cancer   . Family history of prostate cancer   . Family history of colon cancer   . Family history of kidney cancer   . Malignant neoplasm of upper-outer quadrant of right breast in female, 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/09/2018  . Diabetic neuropathy, type II diabetes mellitus (HCC) 11/09/2018  . 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/02/2017  . Trochanteric bursitis, right hip 05/10/2017  . Status post total replacement of right hip 11/26/2016  . DDD (degenerative disc disease), 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  . Low back pain 04/23/2015  . Hiatal hernia 04/13/2013  . Diabetes mellitus (HCC) 01/26/2013  . Umbilical hernia 07/24/2012  . Essential hypertension    Past Medical History:  Diagnosis Date  . Anxiety   . Arthritis    Back, Hip- right  . Atypical chest pain 06/25/2021  . Back pain   . Cancer (HCC)    right breast  . Cataract   . Colitis   . Coronary artery calcification 02/20/2020  . Diabetes mellitus without complication (HCC)    Type II  . Edema 03/05/2021  . Edema of both lower extremities   . Endometriosis   . Family history of adverse reaction to anesthesia    Father - N/V - blood pressure; daughter N/V  . Family history of breast cancer   . Family history of colon cancer   . Family history of kidney cancer   .  Family history of prostate cancer   . Fatty liver   . GERD (gastroesophageal reflux disease)   . History of hiatal hernia   . History of kidney stones   . History of ulcerative colitis   . Hypertension   . Insomnia   . Joint pain   . Kidney problem   . Lower extremity edema 03/05/2021  . 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 Neuropathy   . Osteoarthritis   . Other hyperlipidemia   . Palpitations   . Personal history of radiation therapy   . Pneumonia    1983, 2003  . Pure hypercholesterolemia 02/20/2020  . PVCs (premature ventricular contractions)    benign  . Umbilical hernia   .  Vitamin D deficiency   . Wears glasses     Family History  Problem Relation Age of Onset  . Hyperlipidemia Mother   . Diabetes Mother   . Hypertension Mother   . Parkinson's disease Mother   . Kidney disease Mother   . Kidney disease Father   . Stroke Father   . Heart failure Father   . Hypertension Father   . Heart disease Father   . Diabetes Father   . Cancer Father        colon, prostate, and kidney  . Heart disease Maternal Grandmother   . Diabetes Maternal Grandmother   . Heart disease Maternal Grandfather   . Diabetes Maternal Grandfather   . Cancer Maternal Grandfather        pt unaware of what kind  . Stroke Paternal Grandmother   . Stroke Paternal Grandfather   . Cancer Maternal Aunt        cervical vs ovarian  . Colon cancer Paternal Aunt        dx less than 50  . Breast cancer Paternal Aunt        dx in her 57s  . Prostate cancer Paternal Uncle   . Breast cancer Cousin        pat first cousin, dx over 38  . Prostate cancer Other        PGF's father    Past Surgical History:  Procedure Laterality Date  . APPENDECTOMY    . BREAST BIOPSY  08-13-2009  DR TSUEI   EXCISION LEFT NIPPLE DUCT  . BREAST EXCISIONAL BIOPSY Left    x2  . BREAST EXCISIONAL BIOPSY Right   . BREAST LUMPECTOMY Right 11/16/2018   invasive ductal  . CHOLECYSTECTOMY  1992  . COLON RESECTION  1986    ULCERATIVE COLITIS  . COLON SURGERY    . ESOPHAGEAL MANOMETRY N/A 06/11/2013   Procedure: ESOPHAGEAL MANOMETRY (EM);  Surgeon: Petra Kuba, MD;  Location: [facility] ENDOSCOPY;  Service: Endoscopy;  Laterality: N/A;  . ESOPHAGOGASTRODUODENOSCOPY (EGD) WITH PROPOFOL N/A 05/31/2016   Procedure: ESOPHAGOGASTRODUODENOSCOPY (EGD) WITH PROPOFOL;  Surgeon: Vida Rigger, MD;  Location: [facility] ENDOSCOPY;  Service: Endoscopy;  Laterality: N/A;  . EXCISION RIGHT BREAST MASS   10-20-2005  DR Theron Arista YOUNG  . EYE SURGERY Bilateral 2023   cataract  . FRACTURE SURGERY     left arm  . kidney stone removed  12/2009  . KNEE ARTHROSCOPY Left    MCL  . LEFT THUMB CARPOMETACARPAL JOINT SUSPENSIONPLASTY  04-06-2006  DR Mina Marble  . LEFT WRIST ARTHROSCOPY W/ DEBRIDEMENT AND REMOVAL CYST  03-26-2004  DR Mina Marble  . MYOMECTOMY    . RADIOACTIVE SEED GUIDED PARTIAL MASTECTOMY WITH AXILLARY SENTINEL LYMPH NODE BIOPSY Right 11/16/2018   Procedure: RIGHT BREAST RADIOACTIVE SEED GUIDED PARTIAL MASTECTOMY WITH  SENTINEL  NODE BIOPSY;  Surgeon: Abigail Miyamoto, MD;  Location: [facility] OR;  Service: General;  Laterality: Right;  . RIGHT COLECTOMY    . RIGHT URETEROSCOPIC STONE EXTRACTION  12-11-2009  DR Bjorn Pippin  . TENDON RELEASE    . TONSILLECTOMY    . TOTAL HIP ARTHROPLASTY Right 11/26/2016   Procedure: RIGHT TOTAL HIP ARTHROPLASTY ANTERIOR APPROACH;  Surgeon: Kathryne Hitch, MD;  Location: [facility] ORS;  Service: Orthopedics;  Laterality: Right;  . TOTAL HIP ARTHROPLASTY Left 12/06/2023   Procedure: LEFT TOTAL HIP ARTHROPLASTY ANTERIOR APPROACH;  Surgeon: Kathryne Hitch, MD;  Location: [facility] OR;  Service: Orthopedics;  Laterality: Left;  . TOTAL KNEE ARTHROPLASTY Left  12/02/2017   Procedure: LEFT TOTAL KNEE ARTHROPLASTY;  Surgeon: Kathryne Hitch, MD;  Location: [facility] ORS;  Service: Orthopedics;  Laterality: Left;  . TOTAL KNEE ARTHROPLASTY Right 08/03/2022   Procedure: RIGHT TOTAL KNEE ARTHROPLASTY;  Surgeon: Kathryne Hitch, MD;  Location: [facility] OR;  Service: Orthopedics;  Laterality: Right;  . WRIST SURGERY     both   Social History   Occupational History  . Occupation: Retired  Tobacco Use  . Smoking status: Former    Current packs/day: 0.00    Average packs/day: 1 pack/day for 10.0 years (10.0 ttl pk-yrs)    Types: Cigarettes    Start date: 09/22/1972    Quit date: 09/22/1982    Years since quitting: 41.3  . Smokeless tobacco: Never  . Tobacco comments:    quit 1983  Vaping Use  . Vaping status: Never Used  Substance and Sexual Activity  . Alcohol use: No  . Drug use: No  . Sexual activity: Yes    Partners: Male    Birth control/protection: Post-menopausal

## 2024-01-03 ENCOUNTER — Encounter (INDEPENDENT_AMBULATORY_CARE_PROVIDER_SITE_OTHER): Payer: Self-pay | Admitting: Family Medicine

## 2024-01-03 ENCOUNTER — Telehealth (INDEPENDENT_AMBULATORY_CARE_PROVIDER_SITE_OTHER): Payer: PPO | Admitting: Family Medicine

## 2024-01-03 ENCOUNTER — Ambulatory Visit (INDEPENDENT_AMBULATORY_CARE_PROVIDER_SITE_OTHER): Admitting: Family Medicine

## 2024-01-03 DIAGNOSIS — E669 Obesity, unspecified: Secondary | ICD-10-CM

## 2024-01-03 DIAGNOSIS — E1140 Type 2 diabetes mellitus with diabetic neuropathy, unspecified: Secondary | ICD-10-CM

## 2024-01-03 DIAGNOSIS — Z96641 Presence of right artificial hip joint: Secondary | ICD-10-CM

## 2024-01-03 DIAGNOSIS — Z6832 Body mass index (BMI) 32.0-32.9, adult: Secondary | ICD-10-CM

## 2024-01-03 DIAGNOSIS — Z7985 Long-term (current) use of injectable non-insulin antidiabetic drugs: Secondary | ICD-10-CM

## 2024-01-03 DIAGNOSIS — E119 Type 2 diabetes mellitus without complications: Secondary | ICD-10-CM | POA: Diagnosis not present

## 2024-01-03 MED ORDER — TIRZEPATIDE 5 MG/0.5ML ~~LOC~~ SOAJ: 5.0000 mg | SUBCUTANEOUS | 0 refills | Status: DC

## 2024-01-03 NOTE — Progress Notes (Signed)
 Office: [phone]  /  Fax: [phone]  WEIGHT SUMMARY AND BIOMETRICS  No data recorded No data recorded Other Clinical Data Today's Visit #: 33 Comments: Virtual Visit    Chief Complaint: OBESITY  Virtual Visit via Telephone Note  I connected with [name] on 01/03/24 at 11:40 AM EST by telephone and verified that I am speaking with the correct person using two identifiers.  Location: Patient: home Provider: office   I discussed the limitations, risks, security and privacy concerns of performing an evaluation and management service by telephone and the availability of in person appointments. I also discussed with the patient that there may be a patient responsible charge related to this service. The patient expressed understanding and agreed to proceed.     History of Present Illness   Lauren [name] "Lauren [name]" is a 78 year old female with type two diabetes who presents to discuss her treatment plan and monitor her progress.  She is managing type two diabetes with Mounjaro and a category two eating plan, though adherence to the diet is about 25%. Her blood sugars are well controlled, with a reading of 89 this morning. She has gained four pounds since her last visit, attributing this to fluid retention following recent hip replacement surgery. She is considering adjusting the frequency of her Mounjaro doses from every 10 days to every 9 or 8 days and requests a refill of her medication.  She is recovering from hip replacement surgery performed approximately one month ago. She is unable to drive due to ongoing recovery and reports difficulty with mobility, particularly in getting into her SUV, and experiences pain when attempting to lift her legs. Fluid has been removed from her hip area twice post-surgery, with 37 cc's removed recently and 90 cc's removed shortly after her surgery. She is unable to engage in formal exercise outside of physical therapy, which she is  currently not attending as per medical advice. She is focusing on improving her balance and walking, transitioning from using a walker to a cane, and is considering vehicle modifications to accommodate her mobility challenges.  She reports ongoing issues with her right leg, specifically around the area of her pants leg, which she attributes to a muscle or tendon problem, possibly involving the iliopsoas. She is considering shockwave treatment for this issue.  Her social history includes living with her daughter, who is currently unable to assist with transportation due to her own vehicle being in the shop. Her granddaughter has been using her car while she recovers. She also mentions her daughter's mother-in-law, who is undergoing treatment for stage four cancer, highlighting the supportive family dynamics.          PHYSICAL EXAM:  There were no vitals taken for this visit. There is no height or weight on file to calculate BMI.  DIAGNOSTIC DATA REVIEWED:  BMET    Component Value Date/Time   NA 130 (L) 12/07/2023 0736   NA 132 (L) 06/08/2023 0941   K 3.4 (L) 12/07/2023 0736   CL 95 (L) 12/07/2023 0736   CO2 25 12/07/2023 0736   GLUCOSE 118 (H) 12/07/2023 0736   BUN 15 12/07/2023 0736   BUN 14 06/08/2023 0941   CREATININE 0.97 12/07/2023 0736   CREATININE 0.87 06/08/2022 1439   CALCIUM 8.7 (L) 12/07/2023 0736   GFRNONAA 60 (L) 12/07/2023 0736   GFRNONAA >60 06/08/2022 1439   GFRAA >60 02/04/2020 1212   GFRAA >60 11/09/2018 1405   Lab Results  Component  Value Date   HGBA1C 6.0 (H) 06/08/2023   HGBA1C 6.7 (H) 11/16/2016   Lab Results  Component Value Date   INSULIN 25.6 (H) 06/08/2023   INSULIN 22.3 04/22/2021   Lab Results  Component Value Date   TSH 2.150 05/11/2022   CBC    Component Value Date/Time   WBC 10.9 (H) 11/30/2023 1347   RBC 4.25 11/30/2023 1347   HGB 12.4 11/30/2023 1347   HGB 12.3 06/08/2022 1439   HGB 11.8 05/11/2022 0954   HCT 37.5 11/30/2023  1347   HCT 35.1 05/11/2022 0954   PLT 327 11/30/2023 1347   PLT 272 06/08/2022 1439   PLT 265 05/11/2022 0954   MCV 88.2 11/30/2023 1347   MCV 87 05/11/2022 0954   MCH 29.2 11/30/2023 1347   MCHC 33.1 11/30/2023 1347   RDW 13.3 11/30/2023 1347   RDW 13.2 05/11/2022 0954   Iron Studies No results found for: "IRON", "TIBC", "FERRITIN", "IRONPCTSAT" Lipid Panel     Component Value Date/Time   CHOL 123 06/08/2023 0941   TRIG 75 06/08/2023 0941   HDL 67 06/08/2023 0941   LDLCALC 41 06/08/2023 0941   Hepatic Function Panel     Component Value Date/Time   PROT 6.7 06/08/2023 0941   ALBUMIN 4.3 06/08/2023 0941   AST 19 06/08/2023 0941   AST 16 06/08/2022 1439   ALT 16 06/08/2023 0941   ALT 12 06/08/2022 1439   ALKPHOS 58 06/08/2023 0941   BILITOT 0.4 06/08/2023 0941   BILITOT 0.4 06/08/2022 1439   BILIDIR <0.1 (L) 07/30/2015 1338   IBILI NOT CALCULATED 07/30/2015 1338      Component Value Date/Time   TSH 2.150 05/11/2022 0954   Nutritional Lab Results  Component Value Date   VD25OH 124.0 (H) 09/22/2023   VD25OH 100.0 06/08/2023   VD25OH 55.8 12/01/2022     Assessment and Plan    Postoperative Recovery from Hip Replacement Recovering from hip replacement surgery performed approximately a month ago. Experiencing fluid retention with 37 cc and 90 cc of fluid removed post-surgery. Unable to drive due to pain and difficulty lifting legs. Advised to focus on balance and walking, transitioning from a walker to a cane, and eventually to walking unaided. Discussed potential vehicle modifications if mobility issues persist. - Focus on balance and walking exercises. - Discuss physical therapy options with Dr. Madelyn Brunner or the surgeon on March 19th. - Consider accommodations for vehicle access if mobility issues persist.  Obesity Concerned about a weight gain of four pounds since the last visit, likely due to fluid retention post-hip replacement surgery. Following her  category two eating plan only 25% of the time and unable to exercise formally due to recent surgery. Reassured that weight gain is likely due to fluid retention and encouraged to focus on healthy eating habits. - Encourage healthy eating habits, including raw vegetables as snacks. - Suggest making a low-calorie dip using whole milk [place] yogurt and [employer] powder. -Follow Category 2 eating plan as closely as possible -Increase exercise as allowed by Dr Magnus Ivan  Type 2 Diabetes Mellitus Type 2 diabetes is well-controlled with a blood sugar level of 89 this morning. On Mounjaro and working on diet, exercise, and weight loss. Considering adjusting the frequency of Mounjaro administration from every 10 days to every 9 days to improve management. Plan to monitor response and potentially reduce the interval further to every 8 days if well-tolerated. - Refill Mounjaro prescription. - Adjust Mounjaro administration to every  9 days for the next 1-2 doses, then consider reducing to every 8 days if well-tolerated.   Follow up 4 weeks        She was informed of the importance of frequent follow up visits to maximize her success with intensive lifestyle modifications for her multiple health conditions.    [name], MD

## 2024-01-10 ENCOUNTER — Ambulatory Visit: Admitting: Sports Medicine

## 2024-01-10 ENCOUNTER — Encounter: Payer: Self-pay | Admitting: Sports Medicine

## 2024-01-10 DIAGNOSIS — M25552 Pain in left hip: Secondary | ICD-10-CM

## 2024-01-10 DIAGNOSIS — N390 Urinary tract infection, site not specified: Secondary | ICD-10-CM | POA: Diagnosis not present

## 2024-01-10 DIAGNOSIS — M25551 Pain in right hip: Secondary | ICD-10-CM

## 2024-01-10 DIAGNOSIS — Z96642 Presence of left artificial hip joint: Secondary | ICD-10-CM | POA: Diagnosis not present

## 2024-01-10 DIAGNOSIS — Z96641 Presence of right artificial hip joint: Secondary | ICD-10-CM

## 2024-01-10 DIAGNOSIS — M6281 Muscle weakness (generalized): Secondary | ICD-10-CM | POA: Diagnosis not present

## 2024-01-10 NOTE — Progress Notes (Signed)
 Patient says that she is having pain in the front of both hips and is inquiring about things that can be done to alleviate some of her pain, particularly in the non-surgical right side. She is struggling with daily tasks such as getting in and out of the car. She is open to more shockwave therapy if that will be beneficial.

## 2024-01-10 NOTE — Progress Notes (Signed)
 Lauren [name] - 78 y.o. female MRN [id number]  Date of birth: Nov 26, 1945  Office Visit Note: Visit Date: 01/10/2024 PCP: Daisy Floro, MD Referred by: Daisy Floro, MD  Subjective: Chief Complaint  Patient presents with   Right Hip - Follow-up   HPI: Lauren [name] is a pleasant 78 y.o. female who presents today for right > left anterior hip pain. Her daughter Baird Lyons is present in room today provides some of HPI.  She is recently s/p L-hip THA on 12/06/2023.  She previously had a right hip THA on 11/26/2016.  She does feel the hip is moving better with her surgery but she is still having weakness anterior hips.  Dr. Magnus Ivan did not think she therapy meds.  She does follow with pain management hydrocodone 10-325mg  every 6 hours as needed for pain chronically. She does have a cane she uses moreso for stability but does not feels she requires this use. Some of the worst of her pain is getting in and out of her car (SUV).  Pertinent ROS were reviewed with the patient and found to be negative unless otherwise specified above in HPI.   Assessment & Plan: Visit Diagnoses:  1. Bilateral hip pain   2. Proximal limb muscle weakness   3. Status post total replacement of left hip   4. Status post total replacement of right hip    Plan: Impression is chronic bilateral hip pain with proximal muscle weakness specifically with her hip flexion and hip abductor musculature.  This is in the setting of bilateral THA.  We have performed extra for shockwave therapy in the past which gave her fairly good relief of her pain, however this was unfortunately not long-lasting.  I did discuss that I believe more of her pain is coming from her pelvic girdle and hip musculature weakness which I think will be able to be improved somewhat with formalized physical therapy, a referral was sent today.  We did had an honest discussion that it is unlikely that her pain will ever go away 100%, which she is  understanding and agreeable to.  I would like her to follow-up with her pain management doctor to see if they would like to modify or add any additional medications to help improve her pain.  She may continue her hydrocodone-acetaminophen 10-325 mg every 6 hours as needed for her pain.  I would like to see the progress she makes after about 1 month of therapy and then she will present for reevaluation.  After building up on PT, we may consider a few additional trials of extra for shockwave therapy to see if we can aid in ongoing improvement of pain reduction and function.   Follow-up: Return in about 6 weeks (around 02/21/2024) for bilateral hips   Meds & Orders: No orders of the defined types were placed in this encounter.   Orders Placed This Encounter  Procedures   Ambulatory referral to Physical Therapy     Procedures: No procedures performed      Clinical History: No specialty comments available.  She reports that she quit smoking about 41 years ago. Her smoking use included cigarettes. She started smoking about 51 years ago. She has a 10 pack-year smoking history. She has never used smokeless tobacco.  Recent Labs    03/23/23 1012 06/08/23 0941  HGBA1C 6.5* 6.0*    Objective:    Physical Exam  Gen: Well-appearing, in no acute distress; non-toxic CV: Well-perfused. Warm.  Resp: Breathing  unlabored on room air; no wheezing. Psych: Fluid speech in conversation; appropriate affect; normal thought process  Ortho Exam - Bilateral hips: There is some mild tenderness to deep palpation within the anterior hip joint and overlying hip flexor musculature bilaterally.  The patient does walk with a mild Trendelenburg bilaterally.  There is generalized proximal muscle weakness of the hip flexors and lateral abductors bilaterally.  Imaging:  DG Pelvis Portable CLINICAL DATA:  Postop left hip arthroplasty.  EXAM: PORTABLE PELVIS 1-2 VIEWS  COMPARISON:  None  Available.  FINDINGS: Left hip arthroplasty in expected alignment. No periprosthetic lucency or fracture. Recent postsurgical change includes air and edema in the soft tissues. Lateral skin staples in place. A right hip arthroplasty.  IMPRESSION: Left hip arthroplasty without immediate postoperative complication.  Electronically Signed   By: Narda Rutherford M.D.   On: 12/06/2023 10:14 DG HIP UNILAT WITH PELVIS 1V LEFT CLINICAL DATA:  Elective surgery.  Left hip replacement.  EXAM: DG HIP (WITH OR WITHOUT PELVIS) 1V*L*  COMPARISON:  None Available.  FINDINGS: Three fluoroscopic spot views of the pelvis and left hip obtained in the operating room. Images during hip arthroplasty. Fluoroscopy time 13 seconds. Dose 1.96 mGy.  IMPRESSION: Intraoperative fluoroscopy during left hip arthroplasty.  Electronically Signed   By: Narda Rutherford M.D.   On: 12/06/2023 10:14 DG C-Arm 1-60 Min-No Report Fluoroscopy was utilized by the requesting physician.  No radiographic  interpretation.    Past Medical/Family/Surgical/Social History: Medications & Allergies reviewed per EMR, new medications updated. Patient Active Problem List   Diagnosis Date Noted   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/2023   Stress incontinence 11/22/2023   Restless leg syndrome 09/22/2023   Other hyperlipidemia 06/08/2023   Unilateral primary osteoarthritis, left hip 05/11/2023   Depression 03/16/2023   Allergic rhinitis 12/29/2022   Seasonal affective disorder (HCC) 12/01/2022   BMI 32.0-32.9,adult 12/01/2022   Obesity, Beginning BMI 37.59 12/01/2022   OA (osteoarthritis) of knee 08/03/2022   Status post total right knee replacement 08/03/2022   Stress 07/19/2022   Other constipation 07/19/2022   Atypical chest pain 06/25/2021   Class 2 severe obesity with serious comorbidity and body mass index (BMI) of 37.0 to 37.9 in adult (HCC) 06/03/2021   Vitamin D deficiency 06/03/2021   Other  fatigue 04/22/2021   SOB (shortness of breath) on exertion 04/22/2021   Lower extremity edema 03/05/2021   Coronary artery calcification 02/20/2020   Pure hypercholesterolemia 02/20/2020   Unilateral primary osteoarthritis, right knee 01/03/2019   Genetic testing 12/07/2018   Family history of breast cancer    Family history of prostate cancer    Family history of colon cancer    Family history of kidney cancer    Malignant neoplasm of upper-outer quadrant of right breast in female, 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/09/2018   Diabetic neuropathy, type II diabetes mellitus (HCC) 11/09/2018   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/02/2017   Trochanteric bursitis, right hip 05/10/2017   Status post total replacement of right hip 11/26/2016   DDD (degenerative disc disease), 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   Low back pain 04/23/2015   Hiatal hernia 04/13/2013   Diabetes mellitus (HCC) 01/26/2013   Umbilical hernia 07/24/2012   Essential hypertension    Past Medical History:  Diagnosis Date   Anxiety    Arthritis    Back, Hip- right   Atypical chest pain 06/25/2021   Back pain    Cancer (HCC)    right breast   Cataract  Colitis    Coronary artery calcification 02/20/2020   Diabetes mellitus without complication (HCC)    Type II   Edema 03/05/2021   Edema of both lower extremities    Endometriosis    Family history of adverse reaction to anesthesia    Father - N/V - blood pressure; daughter N/V   Family history of breast cancer    Family history of colon cancer    Family history of kidney cancer    Family history of prostate cancer    Fatty liver    GERD (gastroesophageal reflux disease)    History of hiatal hernia    History of kidney stones    History of ulcerative colitis    Hypertension    Insomnia    Joint pain    Kidney problem    Lower extremity edema 03/05/2021   Lumbar disc disease    L4 L5   Neuropathy    Osteoarthritis    Other hyperlipidemia     Palpitations    Personal history of radiation therapy    Pneumonia    1983, 2003   Pure hypercholesterolemia 02/20/2020   PVCs (premature ventricular contractions)    benign   Umbilical hernia    Vitamin D deficiency    Wears glasses    Family History  Problem Relation Age of Onset   Hyperlipidemia Mother    Diabetes Mother    Hypertension Mother    Parkinson's disease Mother    Kidney disease Mother    Kidney disease Father    Stroke Father    Heart failure Father    Hypertension Father    Heart disease Father    Diabetes Father    Cancer Father        colon, prostate, and kidney   Heart disease Maternal Grandmother    Diabetes Maternal Grandmother    Heart disease Maternal Grandfather    Diabetes Maternal Grandfather    Cancer Maternal Grandfather        pt unaware of what kind   Stroke Paternal Grandmother    Stroke Paternal Grandfather    Cancer Maternal Aunt        cervical vs ovarian   Colon cancer Paternal Aunt        dx less than 50   Breast cancer Paternal Aunt        dx in her 43s   Prostate cancer Paternal Uncle    Breast cancer Cousin        pat first cousin, dx over 30   Prostate cancer Other        PGF's father   Past Surgical History:  Procedure Laterality Date   APPENDECTOMY     BREAST BIOPSY  08-13-2009  DR TSUEI   EXCISION LEFT NIPPLE DUCT   BREAST EXCISIONAL BIOPSY Left    x2   BREAST EXCISIONAL BIOPSY Right    BREAST LUMPECTOMY Right 11/16/2018   invasive ductal   CHOLECYSTECTOMY  1992   COLON RESECTION  1986   ULCERATIVE COLITIS   COLON SURGERY     ESOPHAGEAL MANOMETRY N/A 06/11/2013   Procedure: ESOPHAGEAL MANOMETRY (EM);  Surgeon: Petra Kuba, MD;  Location: [facility];  Service: Endoscopy;  Laterality: N/A;   ESOPHAGOGASTRODUODENOSCOPY (EGD) WITH PROPOFOL N/A 05/31/2016   Procedure: ESOPHAGOGASTRODUODENOSCOPY (EGD) WITH PROPOFOL;  Surgeon: Vida Rigger, MD;  Location: [facility] ENDOSCOPY;  Service: Endoscopy;  Laterality: N/A;    EXCISION RIGHT BREAST MASS   10-20-2005  DR Theron Arista YOUNG   EYE  SURGERY Bilateral 2023   cataract   FRACTURE SURGERY     left arm   kidney stone removed  12/2009   KNEE ARTHROSCOPY Left    MCL   LEFT THUMB CARPOMETACARPAL JOINT SUSPENSIONPLASTY  04-06-2006  DR Mina Marble   LEFT WRIST ARTHROSCOPY W/ DEBRIDEMENT AND REMOVAL CYST  03-26-2004  DR Mina Marble   MYOMECTOMY     RADIOACTIVE SEED GUIDED PARTIAL MASTECTOMY WITH AXILLARY SENTINEL LYMPH NODE BIOPSY Right 11/16/2018   Procedure: RIGHT BREAST RADIOACTIVE SEED GUIDED PARTIAL MASTECTOMY WITH  SENTINEL  NODE BIOPSY;  Surgeon: Abigail Miyamoto, MD;  Location: [facility] OR;  Service: General;  Laterality: Right;   RIGHT COLECTOMY     RIGHT URETEROSCOPIC STONE EXTRACTION  12-11-2009  DR Jonny Ruiz [facility]   TENDON RELEASE     TONSILLECTOMY     TOTAL HIP ARTHROPLASTY Right 11/26/2016   Procedure: RIGHT TOTAL HIP ARTHROPLASTY ANTERIOR APPROACH;  Surgeon: Kathryne Hitch, MD;  Location: [facility] OR[facility];  Service: Orthopedics;  Laterality: Right;   TOTAL HIP ARTHROPLASTY Left 12/06/2023   Procedure: LEFT TOTAL HIP ARTHROPLASTY ANTERIOR APPROACH;  Surgeon: Kathryne Hitch, MD;  Location: [facility] OR;  Service: Orthopedics;  Laterality: Left;   TOTAL KNEE ARTHROPLASTY Left 12/02/2017   Procedure: LEFT TOTAL KNEE ARTHROPLASTY;  Surgeon: Kathryne Hitch, MD;  Location: [facility];  Service: Orthopedics;  Laterality: Left;   TOTAL KNEE ARTHROPLASTY Right 08/03/2022   Procedure: RIGHT TOTAL KNEE ARTHROPLASTY;  Surgeon: Kathryne Hitch, MD;  Location: [facility] OR;  Service: Orthopedics;  Laterality: Right;   WRIST SURGERY     both   Social History   Occupational History   Occupation: Retired  Tobacco Use   Smoking status: Former    Current packs/day: 0.00    Average packs/day: 1 pack/day for 10.0 years (10.0 ttl pk-yrs)    Types: Cigarettes    Start date: 09/22/1972    Quit date: 09/22/1982    Years since quitting: 41.3   Smokeless tobacco: Never   Tobacco  comments:    quit 1983  Vaping Use   Vaping status: Never Used  Substance and Sexual Activity   Alcohol use: No   Drug use: No   Sexual activity: Yes    Partners: Male    Birth control/protection: Post-menopausal

## 2024-01-12 ENCOUNTER — Ambulatory Visit: Payer: PPO | Admitting: Neurology

## 2024-01-12 ENCOUNTER — Ambulatory Visit (HOSPITAL_BASED_OUTPATIENT_CLINIC_OR_DEPARTMENT_OTHER): Payer: PPO | Admitting: Cardiovascular Disease

## 2024-01-13 ENCOUNTER — Ambulatory Visit: Admitting: Sports Medicine

## 2024-01-18 ENCOUNTER — Ambulatory Visit (INDEPENDENT_AMBULATORY_CARE_PROVIDER_SITE_OTHER): Payer: PPO | Admitting: Orthopaedic Surgery

## 2024-01-18 DIAGNOSIS — Z96642 Presence of left artificial hip joint: Secondary | ICD-10-CM

## 2024-01-18 NOTE — Progress Notes (Signed)
 The patient is now about 6 weeks status post a left total hip arthroplasty.  We replaced her knee on the right side as well as her right hip.  She is even had her left knee replaced.  She is 78 years old.  To me if she looks the best she has looked in a while.  She still having appropriate pain and deals with chronic pain but I think her disposition just looks better and her aura looks better.  Outpatient physical therapy is recommended just to help with her mobility and strengthening of her lower extremities.  I think this will help her quite a bit with her posture and I think the further we get out from surgery the better she has been to be with her mobility and getting around in general.  She does tolerate me putting both hips the range of motion but they are sore in the groin and is to be expected.  Overall though I think she looks great.  From my standpoint we will see her back in 3 months.  Will have a standing AP pelvis at that visit.  All questions and concerns were addressed and answered.

## 2024-01-20 NOTE — Therapy (Addendum)
 OUTPATIENT PHYSICAL THERAPY LOWER EXTREMITY EVALUATION   Patient Name: Lauren [name] MRN: [id number] DOB:1946-05-08, 78 y.o., female Today's Date: 01/23/2024  END OF SESSION:  PT End of Session - 01/23/24 1017     Visit Number 1    Number of Visits 16    Date for PT Re-Evaluation 03/19/24    Authorization Type [employer] $10 copay    Progress Note Due on Visit 10    PT Start Time 0930    PT Stop Time 1015    PT Time Calculation (min) 45 min    Activity Tolerance Patient tolerated treatment well    Behavior During Therapy [facility] for tasks assessed/performed             Past Medical History:  Diagnosis Date   Anxiety    Arthritis    Back, Hip- right   Atypical chest pain 06/25/2021   Back pain    Cancer (HCC)    right breast   Cataract    Colitis    Coronary artery calcification 02/20/2020   Diabetes mellitus without complication (HCC)    Type II   Edema 03/05/2021   Edema of both lower extremities    Endometriosis    Family history of adverse reaction to anesthesia    Father - N/V - blood pressure; daughter N/V   Family history of breast cancer    Family history of colon cancer    Family history of kidney cancer    Family history of prostate cancer    Fatty liver    GERD (gastroesophageal reflux disease)    History of hiatal hernia    History of kidney stones    History of ulcerative colitis    Hypertension    Insomnia    Joint pain    Kidney problem    Lower extremity edema 03/05/2021   Lumbar disc disease    L4 L5   Neuropathy    Osteoarthritis    Other hyperlipidemia    Palpitations    Personal history of radiation therapy    Pneumonia    1983, 2003   Pure hypercholesterolemia 02/20/2020   PVCs (premature ventricular contractions)    benign   Umbilical hernia    Vitamin D deficiency    Wears glasses    Past Surgical History:  Procedure Laterality Date   APPENDECTOMY     BREAST BIOPSY  08-13-2009  DR TSUEI   EXCISION LEFT NIPPLE DUCT    BREAST EXCISIONAL BIOPSY Left    x2   BREAST EXCISIONAL BIOPSY Right    BREAST LUMPECTOMY Right 11/16/2018   invasive ductal   CHOLECYSTECTOMY  1992   COLON RESECTION  1986   ULCERATIVE COLITIS   COLON SURGERY     ESOPHAGEAL MANOMETRY N/A 06/11/2013   Procedure: ESOPHAGEAL MANOMETRY (EM);  Surgeon: Petra Kuba, MD;  Location: [facility];  Service: Endoscopy;  Laterality: N/A;   ESOPHAGOGASTRODUODENOSCOPY (EGD) WITH PROPOFOL N/A 05/31/2016   Procedure: ESOPHAGOGASTRODUODENOSCOPY (EGD) WITH PROPOFOL;  Surgeon: Vida Rigger, MD;  Location: [facility];  Service: Endoscopy;  Laterality: N/A;   EXCISION RIGHT BREAST MASS   10-20-2005  DR Theron Arista YOUNG   EYE SURGERY Bilateral 2023   cataract   FRACTURE SURGERY     left arm   kidney stone removed  12/2009   KNEE ARTHROSCOPY Left    MCL   LEFT THUMB CARPOMETACARPAL JOINT SUSPENSIONPLASTY  04-06-2006  DR Mina Marble   LEFT WRIST ARTHROSCOPY W/ DEBRIDEMENT AND REMOVAL CYST  03-26-2004  DR Mina Marble   MYOMECTOMY     RADIOACTIVE SEED GUIDED PARTIAL MASTECTOMY WITH AXILLARY SENTINEL LYMPH NODE BIOPSY Right 11/16/2018   Procedure: RIGHT BREAST RADIOACTIVE SEED GUIDED PARTIAL MASTECTOMY WITH  SENTINEL  NODE BIOPSY;  Surgeon: Abigail Miyamoto, MD;  Location: [facility] OR;  Service: General;  Laterality: Right;   RIGHT COLECTOMY     RIGHT URETEROSCOPIC STONE EXTRACTION  12-11-2009  DR Jonny Ruiz [facility]   TENDON RELEASE     TONSILLECTOMY     TOTAL HIP ARTHROPLASTY Right 11/26/2016   Procedure: RIGHT TOTAL HIP ARTHROPLASTY ANTERIOR APPROACH;  Surgeon: Kathryne Hitch, MD;  Location: [facility];  Service: Orthopedics;  Laterality: Right;   TOTAL HIP ARTHROPLASTY Left 12/06/2023   Procedure: LEFT TOTAL HIP ARTHROPLASTY ANTERIOR APPROACH;  Surgeon: Kathryne Hitch, MD;  Location: [facility] OR;  Service: Orthopedics;  Laterality: Left;   TOTAL KNEE ARTHROPLASTY Left 12/02/2017   Procedure: LEFT TOTAL KNEE ARTHROPLASTY;  Surgeon: Kathryne Hitch, MD;   Location: [facility];  Service: Orthopedics;  Laterality: Left;   TOTAL KNEE ARTHROPLASTY Right 08/03/2022   Procedure: RIGHT TOTAL KNEE ARTHROPLASTY;  Surgeon: Kathryne Hitch, MD;  Location: [facility] OR;  Service: Orthopedics;  Laterality: Right;   WRIST SURGERY     both   Patient Active Problem List   Diagnosis Date Noted   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/2023   Stress incontinence 11/22/2023   Restless leg syndrome 09/22/2023   Other hyperlipidemia 06/08/2023   Unilateral primary osteoarthritis, left hip 05/11/2023   Depression 03/16/2023   Allergic rhinitis 12/29/2022   Seasonal affective disorder (HCC) 12/01/2022   BMI 32.0-32.9,adult 12/01/2022   Obesity, Beginning BMI 37.59 12/01/2022   OA (osteoarthritis) of knee 08/03/2022   Status post total right knee replacement 08/03/2022   Stress 07/19/2022   Other constipation 07/19/2022   Atypical chest pain 06/25/2021   Class 2 severe obesity with serious comorbidity and body mass index (BMI) of 37.0 to 37.9 in adult (HCC) 06/03/2021   Vitamin D deficiency 06/03/2021   Other fatigue 04/22/2021   SOB (shortness of breath) on exertion 04/22/2021   Lower extremity edema 03/05/2021   Coronary artery calcification 02/20/2020   Pure hypercholesterolemia 02/20/2020   Unilateral primary osteoarthritis, right knee 01/03/2019   Genetic testing 12/07/2018   Family history of breast cancer    Family history of prostate cancer    Family history of colon cancer    Family history of kidney cancer    Malignant neoplasm of upper-outer quadrant of right breast in female, 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/09/2018   Diabetic neuropathy, type II diabetes mellitus (HCC) 11/09/2018   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/02/2017   Trochanteric bursitis, right hip 05/10/2017   Status post total replacement of right hip 11/26/2016   DDD (degenerative disc disease), 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   Low back pain 04/23/2015   Hiatal  hernia 04/13/2013   Diabetes mellitus (HCC) 01/26/2013   Umbilical hernia 07/24/2012   Essential hypertension     PCP: Daisy Floro, MD   REFERRING PROVIDER: Madelyn Brunner, DO   REFERRING DIAG:  W11.91 (ICD-10-CM) - Proximal limb muscle weakness  M25.551,M25.552 (ICD-10-CM) - Bilateral hip pain  Z96.642 (ICD-10-CM) - Status post total replacement of left hip  Z96.641 (ICD-10-CM) - Status post total replacement of right hip    THERAPY DIAG:  Pain in right hip  Pain in left hip  Muscle weakness (generalized)  Difficulty in walking, not elsewhere classified  Rationale for Evaluation and  Treatment: Rehabilitation  ONSET DATE: Chronic pain , Patient 7wks s/p L THA 12/06/2023  SUBJECTIVE:   SUBJECTIVE STATEMENT:  Was coming previously for  hip pain however this time states that both hips are giving her trouble. States she has not been doing exercises due to doctors orders to pause exercises for a while. States that she takes the steps 1 at a time.  States that she walks "like she's drunk" due to being off balance.  PERTINENT HISTORY: R THA, L TKA, anxiety, DM,  PAIN:  NPRS scale: 3/10 at best, 8/10 at worst over the past week Pain location: crease of hip/thigh, R>L Pain description: sharp, achy, weakness Aggravating factors: being on feet for a while Relieving factors: ice, rest, pain meds  PRECAUTIONS: Anterior hip  WEIGHT BEARING RESTRICTIONS: No  FALLS:  Has patient fallen in last 6 months? No  LIVING ENVIRONMENT: Lives with: lives alone, 4 mo old basset hound Lives in: House/apartment Stairs: Yes: Internal: 1 steps; going from den to [employer] Has following equipment at home: Single point cane  OCCUPATION: retired- Chiropodist  PLOF: Independent  PATIENT GOALS: to be able to be in less pain/tolerate things better  Next MD visit: 02/21/2024 Dr. Shon Baton   OBJECTIVE:   DIAGNOSTIC FINDINGS:  Left hip arthroplasty in expected alignment. No  periprosthetic lucency or fracture. Recent postsurgical change includes air and edema in the soft tissues. Lateral skin staples in place. A right hip arthroplasty.   IMPRESSION: Left hip arthroplasty without immediate postoperative complication.    PATIENT SURVEYS:  Patient-Specific Activity Scoring Scheme  "0" represents "unable to perform." "10" represents "able to perform at prior level. 0 1 2 3 4 5 6 7 8 9  10 (Date and Score)   Activity Eval     1. Stairs 5    2. Getting in/out cars 4    3. Getting in/out bed 4   4.    5.    Score 4.33/10    Total score = sum of the activity scores/number of activities Minimum detectable change (90%CI) for average score = 2 points Minimum detectable change (90%CI) for single activity score = 3 points  COGNITION: Overall cognitive status: WFL    SENSATION: WFL  EDEMA:  Not present  MUSCLE LENGTH: Not tested  POSTURE:  No Significant postural limitations  PALPATION: Tender to Lt hip flexor tendon and anteriomedial quadricep, Rt hip flexor tendon and medial quadricep  LOWER EXTREMITY ROM:   ROM Right eval Left eval  Hip flexion 100 80  Hip extension    Hip abduction    Hip adduction    Hip internal rotation    Hip external rotation    Knee flexion    Knee extension    Ankle dorsiflexion    Ankle plantarflexion    Ankle inversion    Ankle eversion     (Blank rows = not tested)  LOWER EXTREMITY MMT:  MMT Right eval Left eval  Hip flexion 3+ 3-  Hip extension    Hip abduction    Hip adduction    Hip internal rotation    Hip external rotation    Knee flexion 4 4-; painful  Knee extension 3+; painful 3+; painful  Ankle dorsiflexion 5 5  Ankle plantarflexion    Ankle inversion    Ankle eversion     (Blank rows = not tested)  LOWER EXTREMITY SPECIAL TESTS:  Hip: none Back: (+) SLR bilateral   FUNCTIONAL TESTS:  01/23/24 18 inch chair transfer:  is not able to rise to standing without use of UE on  armrest and rocking for momentum TUG: 16.78 with use of UE on armrest GAIT: Distance walked: clinical distance Assistive device utilized: None Level of assistance: SBA Comments: antalgic. Decreased Lt LE stance time, narrow step width, decreased step length, decreased speed                                                                                                                                                                        TODAY'S TREATMENT                                                                          DATE: 01/23/2024 Therex:    HEP instruction/performance c cues for techniques, handout provided.  Trial set performed of each for comprehension and symptom assessment.  See below for exercise list  PATIENT EDUCATION:  Education details: HEP, POC Person educated: Patient Education method: Explanation, Demonstration, Verbal cues, and Handouts Education comprehension: verbalized understanding, returned demonstration, and verbal cues required  HOME EXERCISE PROGRAM: Access Code: [email or web address] URL: [email or web address] Date: 01/23/2024 Prepared by: Ivery Quale  Exercises - Supine Hip Abduction  - 2 x daily - 6 x weekly - 1 sets - 10 reps - alternating marching  - 2 x daily - 6 x weekly - 1 sets - 10 reps - Standing Hip Abduction with Counter Support  - 2 x daily - 6 x weekly - 1 sets - 10 reps - Standing Lumbar Extension at Wall - Forearms  - 2 x daily - 6 x weekly - 1 sets - 10 reps - Mini Squat with Counter Support  - 2 x daily - 6 x weekly - 1 sets - 10 reps  ASSESSMENT:  CLINICAL IMPRESSION: Patient is a 78 y.o. who comes to clinic with complaints of Bilateral hip pain with L hip presenting with more pain and weakness than Rt after Lt total hip replacement with mobility, strength and movement coordination deficits that impair their ability to perform usual daily and recreational functional activities without increase difficulty/symptoms at this  time.  Patient to benefit from skilled PT services to address impairments and limitations to improve to previous level of function without restriction secondary to condition.   OBJECTIVE IMPAIRMENTS: Abnormal gait, decreased activity tolerance, decreased balance, decreased endurance, decreased mobility, difficulty walking, decreased ROM, decreased strength, increased edema, impaired flexibility, and pain.   ACTIVITY LIMITATIONS: carrying, lifting, bending, sitting, standing, squatting,  stairs, and locomotion level  PARTICIPATION LIMITATIONS: meal prep, cleaning, laundry, driving, shopping, and community activity  PERSONAL FACTORS: Age, Fitness, Time since onset of injury/illness/exacerbation, and 3+ comorbidities: see above  are also affecting patient's functional outcome.   REHAB POTENTIAL: Good  CLINICAL DECISION MAKING: Stable/uncomplicated  EVALUATION COMPLEXITY: Low   GOALS: Goals reviewed with patient? Yes  SHORT TERM GOALS: (target date for Short term goals are 3 weeks 02/13/2024)   1.  Patient will demonstrate independent use of home exercise program to maintain progress from in clinic treatments.  Goal status: New  LONG TERM GOALS: (target dates for all long term goals are 8 weeks  03/19/2024 )   1. Patient will demonstrate/report pain at worst less than or equal to 3/10 to facilitate minimal limitation in daily activity secondary to pain symptoms.  Goal status: New   2. Patient will improve PSFS to > or equal to 6.5/10 to show improved function.  Goal status: New   3. Patient will demonstrate Patient specific functional scale avg > or = 7 to indicate reduced disability due to condition.   Goal status: New   4.  Patient will demonstrate BLE MMT 4+/5 throughout to faciltiate usual transfers, stairs, squatting at [facility] for daily life.   Goal status: New   5.  Patient will demonstrate/report ability to perform 18 inch chair transfer s UE assist.  Goal status: New    6.  Patient will demonstrate TUG </=13.5sec in effort to reduce risk of falls Goal status: New   7.  Patient will demonstrate up and down a flight of stairs with single hand rail with reciprocal gait pattern Goal Status: New   PLAN:  PT FREQUENCY: 1-2x/week  PT DURATION: 8 weeks  PLANNED INTERVENTIONS: Can include 96045- PT Re-evaluation, 97110-Therapeutic exercises, 97530- Therapeutic activity, 97112- Neuromuscular re-education, 97535- Self Care, 97140- Manual therapy, [phone]- Gait training, [phone]- Orthotic Fit/training, [phone]- Canalith repositioning, [id number]- Aquatic Therapy, [phone]- Electrical stimulation (unattended), [phone]- Electrical stimulation (manual), [id number] Physical performance testing, 97016- Vasopneumatic device, [id number]- Ultrasound, [id number]- Traction (mechanical), [id number]- Ionotophoresis 4mg /ml Dexamethasone, Patient/Family education, Balance training, Stair training, Taping, Dry Needling, Joint mobilization, Joint manipulation, Spinal manipulation, Spinal mobilization, Scar mobilization, Vestibular training, Visual/preceptual remediation/compensation, DME instructions, Cryotherapy, and Moist heat.  All performed as medically necessary.  All included unless contraindicated  PLAN FOR NEXT SESSION: Review HEP knowledge/results. Hip flexor and abductor strengthening, standing activities which require pelvic stability in stance, may also benefit from education for use of AD to decrease back pain until gait deviations are addressed.   April Manson, PT,DPT 01/23/2024, 3:17 PM

## 2024-01-23 ENCOUNTER — Ambulatory Visit: Admitting: Physical Therapy

## 2024-01-23 ENCOUNTER — Encounter: Payer: Self-pay | Admitting: Physical Therapy

## 2024-01-23 DIAGNOSIS — M25551 Pain in right hip: Secondary | ICD-10-CM

## 2024-01-23 DIAGNOSIS — M6281 Muscle weakness (generalized): Secondary | ICD-10-CM | POA: Diagnosis not present

## 2024-01-23 DIAGNOSIS — M25552 Pain in left hip: Secondary | ICD-10-CM

## 2024-01-23 DIAGNOSIS — R262 Difficulty in walking, not elsewhere classified: Secondary | ICD-10-CM

## 2024-01-25 ENCOUNTER — Encounter: Payer: Self-pay | Admitting: Physical Therapy

## 2024-01-25 ENCOUNTER — Ambulatory Visit: Admitting: Physical Therapy

## 2024-01-25 DIAGNOSIS — R262 Difficulty in walking, not elsewhere classified: Secondary | ICD-10-CM

## 2024-01-25 DIAGNOSIS — M25552 Pain in left hip: Secondary | ICD-10-CM | POA: Diagnosis not present

## 2024-01-25 DIAGNOSIS — M25551 Pain in right hip: Secondary | ICD-10-CM | POA: Diagnosis not present

## 2024-01-25 DIAGNOSIS — M6281 Muscle weakness (generalized): Secondary | ICD-10-CM

## 2024-01-25 NOTE — Therapy (Signed)
 OUTPATIENT PHYSICAL THERAPY TREATMENT   Patient Name: CAYLEI SPERRY MRN: [id number] DOB:[birth year], 78 y.o., female Today's Date: 01/25/2024  END OF SESSION:  PT End of Session - 01/25/24 0929     Visit Number 2    Number of Visits 16    Date for PT Re-Evaluation 03/19/24    Authorization Type [employer] $10 copay    Progress Note Due on Visit 10    PT Start Time 0930    PT Stop Time 1012    PT Time Calculation (min) 42 min    Activity Tolerance Patient tolerated treatment well    Behavior During Therapy [facility] for tasks assessed/performed              Past Medical History:  Diagnosis Date   Anxiety    Arthritis    Back, Hip- right   Atypical chest pain 06/25/2021   Back pain    Cancer (HCC)    right breast   Cataract    Colitis    Coronary artery calcification 02/20/2020   Diabetes mellitus without complication (HCC)    Type II   Edema 03/05/2021   Edema of both lower extremities    Endometriosis    Family history of adverse reaction to anesthesia    Father - N/V - blood pressure; daughter N/V   Family history of breast cancer    Family history of colon cancer    Family history of kidney cancer    Family history of prostate cancer    Fatty liver    GERD (gastroesophageal reflux disease)    History of hiatal hernia    History of kidney stones    History of ulcerative colitis    Hypertension    Insomnia    Joint pain    Kidney problem    Lower extremity edema 03/05/2021   Lumbar disc disease    L4 L5   Neuropathy    Osteoarthritis    Other hyperlipidemia    Palpitations    Personal history of radiation therapy    Pneumonia    1983, 2003   Pure hypercholesterolemia 02/20/2020   PVCs (premature ventricular contractions)    benign   Umbilical hernia    Vitamin D deficiency    Wears glasses    Past Surgical History:  Procedure Laterality Date   APPENDECTOMY     BREAST BIOPSY  08-13-2009  DR TSUEI   EXCISION LEFT NIPPLE DUCT   BREAST  EXCISIONAL BIOPSY Left    x2   BREAST EXCISIONAL BIOPSY Right    BREAST LUMPECTOMY Right 11/16/2018   invasive ductal   CHOLECYSTECTOMY  1992   COLON RESECTION  1986   ULCERATIVE COLITIS   COLON SURGERY     ESOPHAGEAL MANOMETRY N/A 06/11/2013   Procedure: ESOPHAGEAL MANOMETRY (EM);  Surgeon: Petra Kuba, MD;  Location: [facility];  Service: Endoscopy;  Laterality: N/A;   ESOPHAGOGASTRODUODENOSCOPY (EGD) WITH PROPOFOL N/A 05/31/2016   Procedure: ESOPHAGOGASTRODUODENOSCOPY (EGD) WITH PROPOFOL;  Surgeon: Vida Rigger, MD;  Location: [facility] ENDOSCOPY;  Service: Endoscopy;  Laterality: N/A;   EXCISION RIGHT BREAST MASS   10-20-2005  DR Theron Arista YOUNG   EYE SURGERY Bilateral 2023   cataract   FRACTURE SURGERY     left arm   kidney stone removed  12/2009   KNEE ARTHROSCOPY Left    MCL   LEFT THUMB CARPOMETACARPAL JOINT SUSPENSIONPLASTY  04-06-2006  DR Mina Marble   LEFT WRIST ARTHROSCOPY W/ DEBRIDEMENT AND REMOVAL CYST  03-26-2004  DR  WEINGOLD   MYOMECTOMY     RADIOACTIVE SEED GUIDED PARTIAL MASTECTOMY WITH AXILLARY SENTINEL LYMPH NODE BIOPSY Right 11/16/2018   Procedure: RIGHT BREAST RADIOACTIVE SEED GUIDED PARTIAL MASTECTOMY WITH  SENTINEL  NODE BIOPSY;  Surgeon: Abigail Miyamoto, MD;  Location: [facility] OR;  Service: General;  Laterality: Right;   RIGHT COLECTOMY     RIGHT URETEROSCOPIC STONE EXTRACTION  12-11-2009  DR Jonny Ruiz [facility]   TENDON RELEASE     TONSILLECTOMY     TOTAL HIP ARTHROPLASTY Right 11/26/2016   Procedure: RIGHT TOTAL HIP ARTHROPLASTY ANTERIOR APPROACH;  Surgeon: Kathryne Hitch, MD;  Location: [facility] ORS;  Service: Orthopedics;  Laterality: Right;   TOTAL HIP ARTHROPLASTY Left 12/06/2023   Procedure: LEFT TOTAL HIP ARTHROPLASTY ANTERIOR APPROACH;  Surgeon: Kathryne Hitch, MD;  Location: [facility] OR;  Service: Orthopedics;  Laterality: Left;   TOTAL KNEE ARTHROPLASTY Left 12/02/2017   Procedure: LEFT TOTAL KNEE ARTHROPLASTY;  Surgeon: Kathryne Hitch, MD;  Location: [facility];   Service: Orthopedics;  Laterality: Left;   TOTAL KNEE ARTHROPLASTY Right 08/03/2022   Procedure: RIGHT TOTAL KNEE ARTHROPLASTY;  Surgeon: Kathryne Hitch, MD;  Location: [facility] OR;  Service: Orthopedics;  Laterality: Right;   WRIST SURGERY     both   Patient Active Problem List   Diagnosis Date Noted   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/2023   Stress incontinence 11/22/2023   Restless leg syndrome 09/22/2023   Other hyperlipidemia 06/08/2023   Unilateral primary osteoarthritis, left hip 05/11/2023   Depression 03/16/2023   Allergic rhinitis 12/29/2022   Seasonal affective disorder (HCC) 12/01/2022   BMI 32.0-32.9,adult 12/01/2022   Obesity, Beginning BMI 37.59 12/01/2022   OA (osteoarthritis) of knee 08/03/2022   Status post total right knee replacement 08/03/2022   Stress 07/19/2022   Other constipation 07/19/2022   Atypical chest pain 06/25/2021   Class 2 severe obesity with serious comorbidity and body mass index (BMI) of 37.0 to 37.9 in adult (HCC) 06/03/2021   Vitamin D deficiency 06/03/2021   Other fatigue 04/22/2021   SOB (shortness of breath) on exertion 04/22/2021   Lower extremity edema 03/05/2021   Coronary artery calcification 02/20/2020   Pure hypercholesterolemia 02/20/2020   Unilateral primary osteoarthritis, right knee 01/03/2019   Genetic testing 12/07/2018   Family history of breast cancer    Family history of prostate cancer    Family history of colon cancer    Family history of kidney cancer    Malignant neoplasm of upper-outer quadrant of right breast in female, 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/09/2018   Diabetic neuropathy, type II diabetes mellitus (HCC) 11/09/2018   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/02/2017   Trochanteric bursitis, right hip 05/10/2017   Status post total replacement of right hip 11/26/2016   DDD (degenerative disc disease), 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   Low back pain 04/23/2015   Hiatal hernia 04/13/2013    Diabetes mellitus (HCC) 01/26/2013   Umbilical hernia 07/24/2012   Essential hypertension     PCP: Daisy Floro, MD   REFERRING PROVIDER: Madelyn Brunner, DO   REFERRING DIAG:  Z61.09 (ICD-10-CM) - Proximal limb muscle weakness  M25.551,M25.552 (ICD-10-CM) - Bilateral hip pain  Z96.642 (ICD-10-CM) - Status post total replacement of left hip  Z96.641 (ICD-10-CM) - Status post total replacement of right hip    THERAPY DIAG:  Pain in left hip  Pain in right hip  Muscle weakness (generalized)  Difficulty in walking, not elsewhere classified  Rationale for Evaluation and Treatment:  Rehabilitation  ONSET DATE: Chronic pain , Patient 7wks s/p L THA 12/06/2023  SUBJECTIVE:   SUBJECTIVE STATEMENT: Reports she is "about the same."  C/o difficulty raising leg up on Lt side to get into bed, car, etc.   PERTINENT HISTORY: R THA, L TKA, anxiety, DM  PAIN:  NPRS scale: 3/10 at best, 8/10 at worst over the past week Pain location: crease of hip/thigh, R>L Pain description: sharp, achy, weakness Aggravating factors: being on feet for a while Relieving factors: ice, rest, pain meds  PRECAUTIONS: Anterior hip  WEIGHT BEARING RESTRICTIONS: No  FALLS:  Has patient fallen in last 6 months? No  LIVING ENVIRONMENT: Lives with: lives alone, 4 mo old basset hound Lives in: House/apartment Stairs: Yes: Internal: 1 steps; going from den to [employer] Has following equipment at home: Single point cane  OCCUPATION: retired- Chiropodist  PLOF: Independent  PATIENT GOALS: to be able to be in less pain/tolerate things better  Next MD visit: 02/21/2024 Dr. Shon Baton   OBJECTIVE:   DIAGNOSTIC FINDINGS:  Left hip arthroplasty in expected alignment. No periprosthetic lucency or fracture. Recent postsurgical change includes air and edema in the soft tissues. Lateral skin staples in place. A right hip arthroplasty.   IMPRESSION: Left hip arthroplasty without immediate postoperative  complication.    PATIENT SURVEYS:  Patient-Specific Activity Scoring Scheme  "0" represents "unable to perform." "10" represents "able to perform at prior level. 0 1 2 3 4 5 6 7 8 9  10 (Date and Score)   Activity Eval     1. Stairs 5    2. Getting in/out cars 4    3. Getting in/out bed 4   4.    5.    Score 4.33/10    Total score = sum of the activity scores/number of activities Minimum detectable change (90%CI) for average score = 2 points Minimum detectable change (90%CI) for single activity score = 3 points  COGNITION: Overall cognitive status: WFL    SENSATION: WFL  EDEMA:  Not present  MUSCLE LENGTH: Not tested  POSTURE:  No Significant postural limitations  PALPATION: Tender to Lt hip flexor tendon and anteriomedial quadricep, Rt hip flexor tendon and medial quadricep  LOWER EXTREMITY ROM:   ROM Right eval Left eval  Hip flexion 100 80  Hip extension    Hip abduction    Hip adduction    Hip internal rotation    Hip external rotation    Knee flexion    Knee extension    Ankle dorsiflexion    Ankle plantarflexion    Ankle inversion    Ankle eversion     (Blank rows = not tested)  LOWER EXTREMITY MMT:  MMT Right eval Left eval  Hip flexion 3+ 3-  Hip extension    Hip abduction    Hip adduction    Hip internal rotation    Hip external rotation    Knee flexion 4 4-; painful  Knee extension 3+; painful 3+; painful  Ankle dorsiflexion 5 5  Ankle plantarflexion    Ankle inversion    Ankle eversion     (Blank rows = not tested)  LOWER EXTREMITY SPECIAL TESTS:  Hip: none Back: (+) SLR bilateral   FUNCTIONAL TESTS:  01/23/24 18 inch chair transfer: is not able to rise to standing without use of UE on armrest and rocking for momentum TUG: 16.78 with use of UE on armrest GAIT: Distance walked: clinical distance Assistive device utilized: None Level of assistance: SBA  Comments: antalgic. Decreased Lt LE stance time, narrow step width,  decreased step length, decreased speed                                                                                                                                                                        TODAY'S TREATMENT 01/25/24 TherEx NuStep L6 x 8 min  TherAct Standing marching x10 reps bil; UE support needed Standing abduction x 10 reps bil; UE support needed Standing lumbar extension x 10 reps Standing hip flexion with abduction and ER x 10 reps bil Seated hip flexion and abduction x 10 reps bil Hooklying march x 10 reps bil; L3 band  Manual STM and IASTM to Rt anterior hip with and without percussive device with reduction in pain during and following for limited time   01/23/2024 Therex:    HEP instruction/performance c cues for techniques, handout provided.  Trial set performed of each for comprehension and symptom assessment.  See below for exercise list  PATIENT EDUCATION:  Education details: HEP, POC Person educated: Patient Education method: Explanation, Demonstration, Verbal cues, and Handouts Education comprehension: verbalized understanding, returned demonstration, and verbal cues required  HOME EXERCISE PROGRAM: Access Code: [email or web address] URL: [email or web address] Date: 01/25/2024 Prepared by: Moshe Cipro  Exercises - Supine Hip Abduction  - 2 x daily - 6 x weekly - 1 sets - 10 reps - alternating marching  - 2 x daily - 6 x weekly - 1 sets - 10 reps - Standing Hip Abduction with Counter Support  - 2 x daily - 6 x weekly - 1 sets - 10 reps - Standing Lumbar Extension at Wall - Forearms  - 2 x daily - 6 x weekly - 1 sets - 10 reps - Mini Squat with Counter Support  - 2 x daily - 6 x weekly - 1 sets - 10 reps - Supine March with Resistance Band  - 2 x daily - 6 x weekly - 1 sets - 10 reps  ASSESSMENT:  CLINICAL IMPRESSION: Pt tolerated session well today and focused on functional exercises to work on movements that are especially  challenging to her without significantly increasing her pain.  No goals met as only second visit at this time.  If cleared by surgeon, may try some dry needling to Rt hip.  Continue skilled PT.   OBJECTIVE IMPAIRMENTS: Abnormal gait, decreased activity tolerance, decreased balance, decreased endurance, decreased mobility, difficulty walking, decreased ROM, decreased strength, increased edema, impaired flexibility, and pain.   ACTIVITY LIMITATIONS: carrying, lifting, bending, sitting, standing, squatting, stairs, and locomotion level  PARTICIPATION LIMITATIONS: meal prep, cleaning, laundry, driving, shopping, and community activity  PERSONAL FACTORS: Age, Fitness, Time since onset of injury/illness/exacerbation, and 3+ comorbidities: see above  are  also affecting patient's functional outcome.   REHAB POTENTIAL: Good  CLINICAL DECISION MAKING: Stable/uncomplicated  EVALUATION COMPLEXITY: Low   GOALS: Goals reviewed with patient? Yes  SHORT TERM GOALS: (target date for Short term goals are 3 weeks 02/13/2024)   1.  Patient will demonstrate independent use of home exercise program to maintain progress from in clinic treatments.  Goal status: New  LONG TERM GOALS: (target dates for all long term goals are 8 weeks  03/19/2024 )   1. Patient will demonstrate/report pain at worst less than or equal to 3/10 to facilitate minimal limitation in daily activity secondary to pain symptoms.  Goal status: New   2. Patient will improve PSFS to > or equal to 6.5/10 to show improved function.  Goal status: New   3. Patient will demonstrate Patient specific functional scale avg > or = 7 to indicate reduced disability due to condition.   Goal status: New   4.  Patient will demonstrate BLE MMT 4+/5 throughout to faciltiate usual transfers, stairs, squatting at [facility] for daily life.   Goal status: New   5.  Patient will demonstrate/report ability to perform 18 inch chair transfer s UE assist.   Goal status: New   6.  Patient will demonstrate TUG </=13.5sec in effort to reduce risk of falls Goal status: New   7.  Patient will demonstrate up and down a flight of stairs with single hand rail with reciprocal gait pattern Goal Status: New   PLAN:  PT FREQUENCY: 1-2x/week  PT DURATION: 8 weeks  PLANNED INTERVENTIONS: Can include 84132- PT Re-evaluation, 97110-Therapeutic exercises, 97530- Therapeutic activity, 97112- Neuromuscular re-education, 97535- Self Care, 97140- Manual therapy, [phone]- Gait training, [phone]- Orthotic Fit/training, [id number]- Canalith repositioning, [id number]- Aquatic Therapy, [phone]- Electrical stimulation (unattended), [phone]- Electrical stimulation (manual), [id number] Physical performance testing, 97016- Vasopneumatic device, [id number]- Ultrasound, [id number]- Traction (mechanical), [id number]- Ionotophoresis 4mg /ml Dexamethasone, Patient/Family education, Balance training, Stair training, Taping, Dry Needling, Joint mobilization, Joint manipulation, Spinal manipulation, Spinal mobilization, Scar mobilization, Vestibular training, Visual/preceptual remediation/compensation, DME instructions, Cryotherapy, and Moist heat.  All performed as medically necessary.  All included unless contraindicated  PLAN FOR NEXT SESSION: DN with twisting if cleared by MD,  Review HEP PRN. Hip flexor and abductor strengthening, standing activities which require pelvic stability in stance, may also benefit from education for use of AD to decrease back pain until gait deviations are addressed.   Clarita Crane, PT, DPT 01/25/24 11:17 AM

## 2024-02-01 ENCOUNTER — Ambulatory Visit: Admitting: Physical Therapy

## 2024-02-01 ENCOUNTER — Encounter: Payer: Self-pay | Admitting: Physical Therapy

## 2024-02-01 DIAGNOSIS — R262 Difficulty in walking, not elsewhere classified: Secondary | ICD-10-CM

## 2024-02-01 DIAGNOSIS — M25552 Pain in left hip: Secondary | ICD-10-CM | POA: Diagnosis not present

## 2024-02-01 DIAGNOSIS — M25551 Pain in right hip: Secondary | ICD-10-CM

## 2024-02-01 DIAGNOSIS — M6281 Muscle weakness (generalized): Secondary | ICD-10-CM

## 2024-02-01 NOTE — Therapy (Signed)
 OUTPATIENT PHYSICAL THERAPY TREATMENT   Patient Name: Lauren [name] MRN: [id number] DOB:06/10/1946, 78 y.o., female Today's Date: 02/01/2024  END OF SESSION:  PT End of Session - 02/01/24 1022     Visit Number 3    Number of Visits 16    Date for PT Re-Evaluation 03/19/24    Authorization Type [employer] $10 copay    Progress Note Due on Visit 10    PT Start Time 1017    PT Stop Time 1100    PT Time Calculation (min) 43 min    Activity Tolerance Patient tolerated treatment well    Behavior During Therapy WFL for tasks assessed/performed               Past Medical History:  Diagnosis Date   Anxiety    Arthritis    Back, Hip- right   Atypical chest pain 06/25/2021   Back pain    Cancer (HCC)    right breast   Cataract    Colitis    Coronary artery calcification 02/20/2020   Diabetes mellitus without complication (HCC)    Type II   Edema 03/05/2021   Edema of both lower extremities    Endometriosis    Family history of adverse reaction to anesthesia    Father - N/V - blood pressure; daughter N/V   Family history of breast cancer    Family history of colon cancer    Family history of kidney cancer    Family history of prostate cancer    Fatty liver    GERD (gastroesophageal reflux disease)    History of hiatal hernia    History of kidney stones    History of ulcerative colitis    Hypertension    Insomnia    Joint pain    Kidney problem    Lower extremity edema 03/05/2021   Lumbar disc disease    L4 L5   Neuropathy    Osteoarthritis    Other hyperlipidemia    Palpitations    Personal history of radiation therapy    Pneumonia    1983, 2003   Pure hypercholesterolemia 02/20/2020   PVCs (premature ventricular contractions)    benign   Umbilical hernia    Vitamin D deficiency    Wears glasses    Past Surgical History:  Procedure Laterality Date   APPENDECTOMY     BREAST BIOPSY  08-13-2009  DR TSUEI   EXCISION LEFT NIPPLE DUCT   BREAST  EXCISIONAL BIOPSY Left    x2   BREAST EXCISIONAL BIOPSY Right    BREAST LUMPECTOMY Right 11/16/2018   invasive ductal   CHOLECYSTECTOMY  1992   COLON RESECTION  1986   ULCERATIVE COLITIS   COLON SURGERY     ESOPHAGEAL MANOMETRY N/A 06/11/2013   Procedure: ESOPHAGEAL MANOMETRY (EM);  Surgeon: Petra Kuba, MD;  Location: [facility];  Service: Endoscopy;  Laterality: N/A;   ESOPHAGOGASTRODUODENOSCOPY (EGD) WITH PROPOFOL N/A 05/31/2016   Procedure: ESOPHAGOGASTRODUODENOSCOPY (EGD) WITH PROPOFOL;  Surgeon: Vida Rigger, MD;  Location: [facility]COPY;  Service: Endoscopy;  Laterality: N/A;   EXCISION RIGHT BREAST MASS   10-20-2005  DR Theron Arista YOUNG   EYE SURGERY Bilateral 2023   cataract   FRACTURE SURGERY     left arm   kidney stone removed  12/2009   KNEE ARTHROSCOPY Left    MCL   LEFT THUMB CARPOMETACARPAL JOINT SUSPENSIONPLASTY  04-06-2006  DR Mina Marble   LEFT WRIST ARTHROSCOPY W/ DEBRIDEMENT AND REMOVAL CYST  03-26-2004  DR Mina Marble   MYOMECTOMY     RADIOACTIVE SEED GUIDED PARTIAL MASTECTOMY WITH AXILLARY SENTINEL LYMPH NODE BIOPSY Right 11/16/2018   Procedure: RIGHT BREAST RADIOACTIVE SEED GUIDED PARTIAL MASTECTOMY WITH  SENTINEL  NODE BIOPSY;  Surgeon: Abigail Miyamoto, MD;  Location: [facility] OR;  Service: General;  Laterality: Right;   RIGHT COLECTOMY     RIGHT URETEROSCOPIC STONE EXTRACTION  12-11-2009  DR Jonny Ruiz [facility]   TENDON RELEASE     TONSILLECTOMY     TOTAL HIP ARTHROPLASTY Right 11/26/2016   Procedure: RIGHT TOTAL HIP ARTHROPLASTY ANTERIOR APPROACH;  Surgeon: Kathryne Hitch, MD;  Location: [facility] ORS;  Service: Orthopedics;  Laterality: Right;   TOTAL HIP ARTHROPLASTY Left 12/06/2023   Procedure: LEFT TOTAL HIP ARTHROPLASTY ANTERIOR APPROACH;  Surgeon: Kathryne Hitch, MD;  Location: [facility] OR;  Service: Orthopedics;  Laterality: Left;   TOTAL KNEE ARTHROPLASTY Left 12/02/2017   Procedure: LEFT TOTAL KNEE ARTHROPLASTY;  Surgeon: Kathryne Hitch, MD;  Location: [facility];   Service: Orthopedics;  Laterality: Left;   TOTAL KNEE ARTHROPLASTY Right 08/03/2022   Procedure: RIGHT TOTAL KNEE ARTHROPLASTY;  Surgeon: Kathryne Hitch, MD;  Location: [facility] OR;  Service: Orthopedics;  Laterality: Right;   WRIST SURGERY     both   Patient Active Problem List   Diagnosis Date Noted   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/2023   Stress incontinence 11/22/2023   Restless leg syndrome 09/22/2023   Other hyperlipidemia 06/08/2023   Unilateral primary osteoarthritis, left hip 05/11/2023   Depression 03/16/2023   Allergic rhinitis 12/29/2022   Seasonal affective disorder (HCC) 12/01/2022   BMI 32.0-32.9,adult 12/01/2022   Obesity, Beginning BMI 37.59 12/01/2022   OA (osteoarthritis) of knee 08/03/2022   Status post total right knee replacement 08/03/2022   Stress 07/19/2022   Other constipation 07/19/2022   Atypical chest pain 06/25/2021   Class 2 severe obesity with serious comorbidity and body mass index (BMI) of 37.0 to 37.9 in adult (HCC) 06/03/2021   Vitamin D deficiency 06/03/2021   Other fatigue 04/22/2021   SOB (shortness of breath) on exertion 04/22/2021   Lower extremity edema 03/05/2021   Coronary artery calcification 02/20/2020   Pure hypercholesterolemia 02/20/2020   Unilateral primary osteoarthritis, right knee 01/03/2019   Genetic testing 12/07/2018   Family history of breast cancer    Family history of prostate cancer    Family history of colon cancer    Family history of kidney cancer    Malignant neoplasm of upper-outer quadrant of right breast in female, 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/09/2018   Diabetic neuropathy, type II diabetes mellitus (HCC) 11/09/2018   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/02/2017   Trochanteric bursitis, right hip 05/10/2017   Status post total replacement of right hip 11/26/2016   DDD (degenerative disc disease), 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   Low back pain 04/23/2015   Hiatal hernia 04/13/2013    Diabetes mellitus (HCC) 01/26/2013   Umbilical hernia 07/24/2012   Essential hypertension     PCP: Daisy Floro, MD   REFERRING PROVIDER: Madelyn Brunner, DO   REFERRING DIAG:  Z61.09 (ICD-10-CM) - Proximal limb muscle weakness  M25.551,M25.552 (ICD-10-CM) - Bilateral hip pain  Z96.642 (ICD-10-CM) - Status post total replacement of left hip  Z96.641 (ICD-10-CM) - Status post total replacement of right hip    THERAPY DIAG:  Pain in left hip  Pain in right hip  Muscle weakness (generalized)  Difficulty in walking, not elsewhere classified  Rationale for Evaluation and  Treatment: Rehabilitation  ONSET DATE: Chronic pain , Patient 7wks s/p L THA 12/06/2023  SUBJECTIVE:   SUBJECTIVE STATEMENT: Able to lift Rt leg into car now, still needs to use hand with her left.   PERTINENT HISTORY: R THA, L TKA, anxiety, DM  PAIN:  NPRS scale: 3/10 at best, 8/10 at worst over the past week Pain location: crease of hip/thigh, R>L Pain description: sharp, achy, weakness Aggravating factors: being on feet for a while Relieving factors: ice, rest, pain meds  PRECAUTIONS: Anterior hip  WEIGHT BEARING RESTRICTIONS: No  FALLS:  Has patient fallen in last 6 months? No  LIVING ENVIRONMENT: Lives with: lives alone, 4 mo old basset hound Lives in: House/apartment Stairs: Yes: Internal: 1 steps; going from den to [employer] Has following equipment at home: Single point cane  OCCUPATION: retired- Chiropodist  PLOF: Independent  PATIENT GOALS: to be able to be in less pain/tolerate things better  Next MD visit: 02/21/2024 Dr. Shon Baton   OBJECTIVE:   DIAGNOSTIC FINDINGS:  Left hip arthroplasty in expected alignment. No periprosthetic lucency or fracture. Recent postsurgical change includes air and edema in the soft tissues. Lateral skin staples in place. A right hip arthroplasty.   IMPRESSION: Left hip arthroplasty without immediate postoperative complication.    PATIENT  SURVEYS:  Patient-Specific Activity Scoring Scheme  "0" represents "unable to perform." "10" represents "able to perform at prior level. 0 1 2 3 4 5 6 7 8 9  10 (Date and Score)   Activity Eval     1. Stairs 5    2. Getting in/out cars 4    3. Getting in/out bed 4   4.    5.    Score 4.33/10    Total score = sum of the activity scores/number of activities Minimum detectable change (90%CI) for average score = 2 points Minimum detectable change (90%CI) for single activity score = 3 points  COGNITION: Overall cognitive status: WFL    SENSATION: WFL  EDEMA:  Not present  MUSCLE LENGTH: Not tested  POSTURE:  No Significant postural limitations  PALPATION: Tender to Lt hip flexor tendon and anteriomedial quadricep, Rt hip flexor tendon and medial quadricep  LOWER EXTREMITY ROM:   ROM Right eval Left eval  Hip flexion 100 80  Hip extension    Hip abduction    Hip adduction    Hip internal rotation    Hip external rotation    Knee flexion    Knee extension    Ankle dorsiflexion    Ankle plantarflexion    Ankle inversion    Ankle eversion     (Blank rows = not tested)  LOWER EXTREMITY MMT:  MMT Right eval Left eval  Hip flexion 3+ 3-  Hip extension    Hip abduction    Hip adduction    Hip internal rotation    Hip external rotation    Knee flexion 4 4-; painful  Knee extension 3+; painful 3+; painful  Ankle dorsiflexion 5 5  Ankle plantarflexion    Ankle inversion    Ankle eversion     (Blank rows = not tested)  LOWER EXTREMITY SPECIAL TESTS:  Hip: none Back: (+) SLR bilateral   FUNCTIONAL TESTS:  01/23/24 18 inch chair transfer: is not able to rise to standing without use of UE on armrest and rocking for momentum TUG: 16.78 with use of UE on armrest GAIT: Distance walked: clinical distance Assistive device utilized: None Level of assistance: SBA Comments: antalgic. Decreased Lt  LE stance time, narrow step width, decreased step length,  decreased speed                                                                                                                                                                        TODAY'S TREATMENT 02/01/24 TherAct Standing marching 2x10 reps with L3 band bil; UE support needed Standing abduction 2x10 reps with L3 band bil; UE support needed Squats 2x10; min cues for technique  Manual STM and IASTM to Rt anterior hip along incision; skilled palpation and monitoring of soft tissue during DN  Trigger Point Dry Needling  Initial Treatment: Pt instructed on Dry Needling rational, procedures, and possible side effects. Pt instructed to expect mild to moderate muscle soreness later in the day and/or into the next day.  Pt instructed in methods to reduce muscle soreness. Pt instructed to continue prescribed HEP. Patient was educated on signs and symptoms of infection and other risk factors and advised to seek medical attention should they occur.  Patient verbalized understanding of these instructions and education.   Patient Verbal Consent Given: Yes Education Handout Provided: No has had previous episodes of DN, added to pt instructions to access in MyChart Muscles Treated: Rt proximal quad and scar tissue at Rt THA incision Electrical Stimulation Performed: No Treatment Response/Outcome: decreased tightness; twisting technique with needles along incision which she tolerated well    01/25/24 TherEx NuStep L6 x 8 min  TherAct Standing marching x10 reps bil; UE support needed Standing abduction x 10 reps bil; UE support needed Standing lumbar extension x 10 reps Standing hip flexion with abduction and ER x 10 reps bil Seated hip flexion and abduction x 10 reps bil Hooklying march x 10 reps bil; L3 band  Manual STM and IASTM to Rt anterior hip with and without percussive device with reduction in pain during and following for limited time   01/23/2024 Therex:    HEP  instruction/performance c cues for techniques, handout provided.  Trial set performed of each for comprehension and symptom assessment.  See below for exercise list  PATIENT EDUCATION:  Education details: HEP, POC Person educated: Patient Education method: Explanation, Demonstration, Verbal cues, and Handouts Education comprehension: verbalized understanding, returned demonstration, and verbal cues required  HOME EXERCISE PROGRAM: Access Code: [email or web address] URL: [email or web address] Date: 01/25/2024 Prepared by: Moshe Cipro  Exercises - Supine Hip Abduction  - 2 x daily - 6 x weekly - 1 sets - 10 reps - alternating marching  - 2 x daily - 6 x weekly - 1 sets - 10 reps - Standing Hip Abduction with Counter Support  - 2 x daily - 6 x weekly - 1 sets - 10 reps - Standing Lumbar Extension at Wall -  Forearms  - 2 x daily - 6 x weekly - 1 sets - 10 reps - Mini Squat with Counter Support  - 2 x daily - 6 x weekly - 1 sets - 10 reps - Supine March with Resistance Band  - 2 x daily - 6 x weekly - 1 sets - 10 reps  ASSESSMENT:  CLINICAL IMPRESSION: Pt reporting decreased tightness following DN today to Rt hip.  Overall slowly progressing with PT with improved functional activities reported today.  Will continue to benefit from PT to maximize function.    OBJECTIVE IMPAIRMENTS: Abnormal gait, decreased activity tolerance, decreased balance, decreased endurance, decreased mobility, difficulty walking, decreased ROM, decreased strength, increased edema, impaired flexibility, and pain.   ACTIVITY LIMITATIONS: carrying, lifting, bending, sitting, standing, squatting, stairs, and locomotion level  PARTICIPATION LIMITATIONS: meal prep, cleaning, laundry, driving, shopping, and community activity  PERSONAL FACTORS: Age, Fitness, Time since onset of injury/illness/exacerbation, and 3+ comorbidities: see above  are also affecting patient's functional outcome.   REHAB POTENTIAL:  Good  CLINICAL DECISION MAKING: Stable/uncomplicated  EVALUATION COMPLEXITY: Low   GOALS: Goals reviewed with patient? Yes  SHORT TERM GOALS: (target date for Short term goals are 3 weeks 02/13/2024)   1.  Patient will demonstrate independent use of home exercise program to maintain progress from in clinic treatments.  Goal status: New  LONG TERM GOALS: (target dates for all long term goals are 8 weeks  03/19/2024 )   1. Patient will demonstrate/report pain at worst less than or equal to 3/10 to facilitate minimal limitation in daily activity secondary to pain symptoms.  Goal status: New   2. Patient will improve PSFS to > or equal to 6.5/10 to show improved function.  Goal status: New   3. Patient will demonstrate Patient specific functional scale avg > or = 7 to indicate reduced disability due to condition.   Goal status: New   4.  Patient will demonstrate BLE MMT 4+/5 throughout to faciltiate usual transfers, stairs, squatting at [facility] for daily life.   Goal status: New   5.  Patient will demonstrate/report ability to perform 18 inch chair transfer s UE assist.  Goal status: New   6.  Patient will demonstrate TUG </=13.5sec in effort to reduce risk of falls Goal status: New   7.  Patient will demonstrate up and down a flight of stairs with single hand rail with reciprocal gait pattern Goal Status: New   PLAN:  PT FREQUENCY: 1-2x/week  PT DURATION: 8 weeks  PLANNED INTERVENTIONS: Can include 54098- PT Re-evaluation, 97110-Therapeutic exercises, 97530- Therapeutic activity, 97112- Neuromuscular re-education, 97535- Self Care, 97140- Manual therapy, [phone]- Gait training, [phone]- Orthotic Fit/training, [phone]- Canalith repositioning, [id number]- Aquatic Therapy, [phone]- Electrical stimulation (unattended), [phone]- Electrical stimulation (manual), [id number] Physical performance testing, 97016- Vasopneumatic device, [id number]- Ultrasound, [id number]- Traction (mechanical), [id number]- Ionotophoresis  4mg /ml Dexamethasone, Patient/Family education, Balance training, Stair training, Taping, Dry Needling, Joint mobilization, Joint manipulation, Spinal manipulation, Spinal mobilization, Scar mobilization, Vestibular training, Visual/preceptual remediation/compensation, DME instructions, Cryotherapy, and Moist heat.  All performed as medically necessary.  All included unless contraindicated  PLAN FOR NEXT SESSION: check HEP for STG assessment, repeat DN with twisting if needed,  Hip flexor and abductor strengthening, standing activities which require pelvic stability in stance, may also benefit from education for use of AD to decrease back pain until gait deviations are addressed.   Clarita Crane, PT, DPT 02/01/24 11:15 AM

## 2024-02-07 ENCOUNTER — Ambulatory Visit (INDEPENDENT_AMBULATORY_CARE_PROVIDER_SITE_OTHER): Admitting: Family Medicine

## 2024-02-07 ENCOUNTER — Ambulatory Visit: Admitting: Physical Therapy

## 2024-02-07 ENCOUNTER — Encounter (INDEPENDENT_AMBULATORY_CARE_PROVIDER_SITE_OTHER): Payer: Self-pay | Admitting: Family Medicine

## 2024-02-07 ENCOUNTER — Encounter: Payer: Self-pay | Admitting: Physical Therapy

## 2024-02-07 ENCOUNTER — Other Ambulatory Visit (INDEPENDENT_AMBULATORY_CARE_PROVIDER_SITE_OTHER): Payer: Self-pay | Admitting: Family Medicine

## 2024-02-07 VITALS — BP 125/71 | HR 65 | Temp 97.9°F | Ht 64.0 in | Wt 187.0 lb

## 2024-02-07 DIAGNOSIS — M25552 Pain in left hip: Secondary | ICD-10-CM

## 2024-02-07 DIAGNOSIS — M6281 Muscle weakness (generalized): Secondary | ICD-10-CM | POA: Diagnosis not present

## 2024-02-07 DIAGNOSIS — E559 Vitamin D deficiency, unspecified: Secondary | ICD-10-CM | POA: Diagnosis not present

## 2024-02-07 DIAGNOSIS — E119 Type 2 diabetes mellitus without complications: Secondary | ICD-10-CM

## 2024-02-07 DIAGNOSIS — Z7985 Long-term (current) use of injectable non-insulin antidiabetic drugs: Secondary | ICD-10-CM | POA: Diagnosis not present

## 2024-02-07 DIAGNOSIS — R262 Difficulty in walking, not elsewhere classified: Secondary | ICD-10-CM | POA: Diagnosis not present

## 2024-02-07 DIAGNOSIS — Z6832 Body mass index (BMI) 32.0-32.9, adult: Secondary | ICD-10-CM | POA: Diagnosis not present

## 2024-02-07 DIAGNOSIS — E785 Hyperlipidemia, unspecified: Secondary | ICD-10-CM

## 2024-02-07 DIAGNOSIS — M25551 Pain in right hip: Secondary | ICD-10-CM

## 2024-02-07 DIAGNOSIS — F5089 Other specified eating disorder: Secondary | ICD-10-CM | POA: Diagnosis not present

## 2024-02-07 DIAGNOSIS — E669 Obesity, unspecified: Secondary | ICD-10-CM | POA: Diagnosis not present

## 2024-02-07 DIAGNOSIS — J3089 Other allergic rhinitis: Secondary | ICD-10-CM | POA: Diagnosis not present

## 2024-02-07 DIAGNOSIS — F3289 Other specified depressive episodes: Secondary | ICD-10-CM

## 2024-02-07 DIAGNOSIS — E1140 Type 2 diabetes mellitus with diabetic neuropathy, unspecified: Secondary | ICD-10-CM

## 2024-02-07 DIAGNOSIS — E7849 Other hyperlipidemia: Secondary | ICD-10-CM

## 2024-02-07 MED ORDER — ROSUVASTATIN CALCIUM 20 MG PO TABS: 20.0000 mg | ORAL_TABLET | Freq: Every day | ORAL | 0 refills | Status: DC

## 2024-02-07 MED ORDER — FLUTICASONE PROPIONATE 50 MCG/ACT NA SUSP: 2.0000 | Freq: Every day | NASAL | 0 refills | Status: DC | PRN

## 2024-02-07 MED ORDER — TIRZEPATIDE 7.5 MG/0.5ML ~~LOC~~ SOAJ: 7.5000 mg | SUBCUTANEOUS | 0 refills | Status: DC

## 2024-02-07 MED ORDER — ESCITALOPRAM OXALATE 20 MG PO TABS
20.0000 mg | ORAL_TABLET | Freq: Every day | ORAL | 0 refills | Status: DC
Start: 2024-02-07 — End: 2024-03-13

## 2024-02-07 NOTE — Therapy (Signed)
 OUTPATIENT PHYSICAL THERAPY TREATMENT   Patient Name: Lauren [name] MRN: [id number] DOB:Feb 16, 1946, 78 y.o., female Today's Date: 02/07/2024  END OF SESSION:  PT End of Session - 02/07/24 0915     Visit Number 4    Number of Visits 16    Date for PT Re-Evaluation 03/19/24    Authorization Type [employer] $10 copay    Progress Note Due on Visit 10    PT Start Time 0857    PT Stop Time 0930    PT Time Calculation (min) 33 min    Activity Tolerance Patient tolerated treatment well    Behavior During Therapy [facility] for tasks assessed/performed               Past Medical History:  Diagnosis Date   Anxiety    Arthritis    Back, Hip- right   Atypical chest pain 06/25/2021   Back pain    Cancer (HCC)    right breast   Cataract    Colitis    Coronary artery calcification 02/20/2020   Diabetes mellitus without complication (HCC)    Type II   Edema 03/05/2021   Edema of both lower extremities    Endometriosis    Family history of adverse reaction to anesthesia    Father - N/V - blood pressure; daughter N/V   Family history of breast cancer    Family history of colon cancer    Family history of kidney cancer    Family history of prostate cancer    Fatty liver    GERD (gastroesophageal reflux disease)    History of hiatal hernia    History of kidney stones    History of ulcerative colitis    Hypertension    Insomnia    Joint pain    Kidney problem    Lower extremity edema 03/05/2021   Lumbar disc disease    L4 L5   Neuropathy    Osteoarthritis    Other hyperlipidemia    Palpitations    Personal history of radiation therapy    Pneumonia    1983, 2003   Pure hypercholesterolemia 02/20/2020   PVCs (premature ventricular contractions)    benign   Umbilical hernia    Vitamin D deficiency    Wears glasses    Past Surgical History:  Procedure Laterality Date   APPENDECTOMY     BREAST BIOPSY  08-13-2009  DR TSUEI   EXCISION LEFT NIPPLE DUCT   BREAST  EXCISIONAL BIOPSY Left    x2   BREAST EXCISIONAL BIOPSY Right    BREAST LUMPECTOMY Right 11/16/2018   invasive ductal   CHOLECYSTECTOMY  1992   COLON RESECTION  1986   ULCERATIVE COLITIS   COLON SURGERY     ESOPHAGEAL MANOMETRY N/A 06/11/2013   Procedure: ESOPHAGEAL MANOMETRY (EM);  Surgeon: Petra Kuba, MD;  Location: [facility];  Service: Endoscopy;  Laterality: N/A;   ESOPHAGOGASTRODUODENOSCOPY (EGD) WITH PROPOFOL N/A 05/31/2016   Procedure: ESOPHAGOGASTRODUODENOSCOPY (EGD) WITH PROPOFOL;  Surgeon: Vida Rigger, MD;  Location: [facility] ENDOSCOPY;  Service: Endoscopy;  Laterality: N/A;   EXCISION RIGHT BREAST MASS   10-20-2005  DR Theron Arista YOUNG   EYE SURGERY Bilateral 2023   cataract   FRACTURE SURGERY     left arm   kidney stone removed  12/2009   KNEE ARTHROSCOPY Left    MCL   LEFT THUMB CARPOMETACARPAL JOINT SUSPENSIONPLASTY  04-06-2006  DR Mina Marble   LEFT WRIST ARTHROSCOPY W/ DEBRIDEMENT AND REMOVAL CYST  03-26-2004  DR Mina Marble   MYOMECTOMY     RADIOACTIVE SEED GUIDED PARTIAL MASTECTOMY WITH AXILLARY SENTINEL LYMPH NODE BIOPSY Right 11/16/2018   Procedure: RIGHT BREAST RADIOACTIVE SEED GUIDED PARTIAL MASTECTOMY WITH  SENTINEL  NODE BIOPSY;  Surgeon: Abigail Miyamoto, MD;  Location: [facility] OR;  Service: General;  Laterality: Right;   RIGHT COLECTOMY     RIGHT URETEROSCOPIC STONE EXTRACTION  12-11-2009  DR Jonny Ruiz [facility]   TENDON RELEASE     TONSILLECTOMY     TOTAL HIP ARTHROPLASTY Right 11/26/2016   Procedure: RIGHT TOTAL HIP ARTHROPLASTY ANTERIOR APPROACH;  Surgeon: Kathryne Hitch, MD;  Location: [facility] ORS;  Service: Orthopedics;  Laterality: Right;   TOTAL HIP ARTHROPLASTY Left 12/06/2023   Procedure: LEFT TOTAL HIP ARTHROPLASTY ANTERIOR APPROACH;  Surgeon: Kathryne Hitch, MD;  Location: [facility] OR;  Service: Orthopedics;  Laterality: Left;   TOTAL KNEE ARTHROPLASTY Left 12/02/2017   Procedure: LEFT TOTAL KNEE ARTHROPLASTY;  Surgeon: Kathryne Hitch, MD;  Location: [facility] OR[facility];   Service: Orthopedics;  Laterality: Left;   TOTAL KNEE ARTHROPLASTY Right 08/03/2022   Procedure: RIGHT TOTAL KNEE ARTHROPLASTY;  Surgeon: Kathryne Hitch, MD;  Location: [facility] OR;  Service: Orthopedics;  Laterality: Right;   WRIST SURGERY     both   Patient Active Problem List   Diagnosis Date Noted   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/2023   Stress incontinence 11/22/2023   Restless leg syndrome 09/22/2023   Other hyperlipidemia 06/08/2023   Unilateral primary osteoarthritis, left hip 05/11/2023   Depression 03/16/2023   Allergic rhinitis 12/29/2022   Seasonal affective disorder (HCC) 12/01/2022   BMI 32.0-32.9,adult 12/01/2022   Obesity, Beginning BMI 37.59 12/01/2022   OA (osteoarthritis) of knee 08/03/2022   Status post total right knee replacement 08/03/2022   Stress 07/19/2022   Other constipation 07/19/2022   Atypical chest pain 06/25/2021   Class 2 severe obesity with serious comorbidity and body mass index (BMI) of 37.0 to 37.9 in adult (HCC) 06/03/2021   Vitamin D deficiency 06/03/2021   Other fatigue 04/22/2021   SOB (shortness of breath) on exertion 04/22/2021   Lower extremity edema 03/05/2021   Coronary artery calcification 02/20/2020   Pure hypercholesterolemia 02/20/2020   Unilateral primary osteoarthritis, right knee 01/03/2019   Genetic testing 12/07/2018   Family history of breast cancer    Family history of prostate cancer    Family history of colon cancer    Family history of kidney cancer    Malignant neoplasm of upper-outer quadrant of right breast in female, 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/09/2018   Diabetic neuropathy, type II diabetes mellitus (HCC) 11/09/2018   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/02/2017   Trochanteric bursitis, right hip 05/10/2017   Status post total replacement of right hip 11/26/2016   DDD (degenerative disc disease), 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   Low back pain 04/23/2015   Hiatal hernia 04/13/2013    Diabetes mellitus (HCC) 01/26/2013   Umbilical hernia 07/24/2012   Essential hypertension     PCP: Daisy Floro, MD   REFERRING PROVIDER: Madelyn Brunner, DO   REFERRING DIAG:  W09.81 (ICD-10-CM) - Proximal limb muscle weakness  M25.551,M25.552 (ICD-10-CM) - Bilateral hip pain  Z96.642 (ICD-10-CM) - Status post total replacement of left hip  Z96.641 (ICD-10-CM) - Status post total replacement of right hip    THERAPY DIAG:  Pain in left hip  Pain in right hip  Muscle weakness (generalized)  Difficulty in walking, not elsewhere classified  Rationale for Evaluation and  Treatment: Rehabilitation  ONSET DATE: Chronic pain , Patient 7wks s/p L THA 12/06/2023  SUBJECTIVE:   SUBJECTIVE STATEMENT: She felt the DN was helpful so does consent to having this done again   PERTINENT HISTORY: R THA, L TKA, anxiety, DM  PAIN:  NPRS scale: 3/10 today Pain location: crease of hip/thigh, R>L Pain description: sharp, achy, weakness Aggravating factors: being on feet for a while Relieving factors: ice, rest, pain meds  PRECAUTIONS: Anterior hip  WEIGHT BEARING RESTRICTIONS: No  FALLS:  Has patient fallen in last 6 months? No  LIVING ENVIRONMENT: Lives with: lives alone, 4 mo old basset hound Lives in: House/apartment Stairs: Yes: Internal: 1 steps; going from den to [employer] Has following equipment at home: Single point cane  OCCUPATION: retired- Chiropodist  PLOF: Independent  PATIENT GOALS: to be able to be in less pain/tolerate things better  Next MD visit: 02/21/2024 Dr. Shon Baton   OBJECTIVE:   DIAGNOSTIC FINDINGS:  Left hip arthroplasty in expected alignment. No periprosthetic lucency or fracture. Recent postsurgical change includes air and edema in the soft tissues. Lateral skin staples in place. A right hip arthroplasty.   IMPRESSION: Left hip arthroplasty without immediate postoperative complication.    PATIENT SURVEYS:  Patient-Specific Activity  Scoring Scheme  "0" represents "unable to perform." "10" represents "able to perform at prior level. 0 1 2 3 4 5 6 7 8 9  10 (Date and Score)   Activity Eval     1. Stairs 5    2. Getting in/out cars 4    3. Getting in/out bed 4   4.    5.    Score 4.33/10    Total score = sum of the activity scores/number of activities Minimum detectable change (90%CI) for average score = 2 points Minimum detectable change (90%CI) for single activity score = 3 points  COGNITION: Overall cognitive status: WFL    SENSATION: WFL  EDEMA:  Not present  MUSCLE LENGTH: Not tested  POSTURE:  No Significant postural limitations  PALPATION: Tender to Lt hip flexor tendon and anteriomedial quadricep, Rt hip flexor tendon and medial quadricep  LOWER EXTREMITY ROM:   ROM Right eval Left eval  Hip flexion 100 80  Hip extension    Hip abduction    Hip adduction    Hip internal rotation    Hip external rotation    Knee flexion    Knee extension    Ankle dorsiflexion    Ankle plantarflexion    Ankle inversion    Ankle eversion     (Blank rows = not tested)  LOWER EXTREMITY MMT:  MMT Right eval Left eval  Hip flexion 3+ 3-  Hip extension    Hip abduction    Hip adduction    Hip internal rotation    Hip external rotation    Knee flexion 4 4-; painful  Knee extension 3+; painful 3+; painful  Ankle dorsiflexion 5 5  Ankle plantarflexion    Ankle inversion    Ankle eversion     (Blank rows = not tested)  LOWER EXTREMITY SPECIAL TESTS:  Hip: none Back: (+) SLR bilateral   FUNCTIONAL TESTS:  01/23/24 18 inch chair transfer: is not able to rise to standing without use of UE on armrest and rocking for momentum TUG: 16.78 with use of UE on armrest GAIT: Distance walked: clinical distance Assistive device utilized: None Level of assistance: SBA Comments: antalgic. Decreased Lt LE stance time, narrow step width, decreased step length, decreased  speed                                                                                                                                                                         TODAY'S TREATMENT 02/07/24 TherAct Standing marching x10 reps blat L3 band, then standing SLR with L3 band bil; UE support needed Standing abduction 2x10 reps with L3 band bil; UE support needed Standing hip extension green band 2X10 bilat Therex Nu step L6 X 8 min Supine quad stretch with strap 30 sec X 3 bilat  Manual STM and IASTM to Rt anterior hip along incision; skilled palpation and monitoring of soft tissue during DN Trigger Point Dry Needling Subsequent Treatment: Instructions provided previously at initial dry needling treatment.  Patient Verbal Consent Given: Yes Education Handout Provided: Previously Provided Muscles Treated: Proximal quads Rt and left Electrical Stimulation Performed: No Treatment Response/Outcome:  good overall tolerance,twitch response noted        02/01/24 TherAct Standing marching 2x10 reps with L3 band bil; UE support needed Standing abduction 2x10 reps with L3 band bil; UE support needed Squats 2x10; min cues for technique  Manual STM and IASTM to Rt anterior hip along incision; skilled palpation and monitoring of soft tissue during DN  Trigger Point Dry Needling  Initial Treatment: Pt instructed on Dry Needling rational, procedures, and possible side effects. Pt instructed to expect mild to moderate muscle soreness later in the day and/or into the next day.  Pt instructed in methods to reduce muscle soreness. Pt instructed to continue prescribed HEP. Patient was educated on signs and symptoms of infection and other risk factors and advised to seek medical attention should they occur.  Patient verbalized understanding of these instructions and education.   Patient Verbal Consent Given: Yes Education Handout Provided: No has had previous episodes of DN, added to pt instructions to access in  MyChart Muscles Treated: Rt proximal quad and scar tissue at Rt THA incision Electrical Stimulation Performed: No Treatment Response/Outcome: decreased tightness; twisting technique with needles along incision which she tolerated well    01/25/24 TherEx NuStep L6 x 8 min  TherAct Standing marching x10 reps bil; UE support needed Standing abduction x 10 reps bil; UE support needed Standing lumbar extension x 10 reps Standing hip flexion with abduction and ER x 10 reps bil Seated hip flexion and abduction x 10 reps bil Hooklying march x 10 reps bil; L3 band  Manual STM and IASTM to Rt anterior hip with and without percussive device with reduction in pain during and following for limited time   01/23/2024 Therex:    HEP instruction/performance c cues for techniques, handout provided.  Trial set performed of each for comprehension and symptom assessment.  See  below for exercise list  PATIENT EDUCATION:  Education details: HEP, POC Person educated: Patient Education method: Explanation, Demonstration, Verbal cues, and Handouts Education comprehension: verbalized understanding, returned demonstration, and verbal cues required  HOME EXERCISE PROGRAM: Access Code: [email or web address] URL: [email or web address] Date: 01/25/2024 Prepared by: Moshe Cipro  Exercises - Supine Hip Abduction  - 2 x daily - 6 x weekly - 1 sets - 10 reps - alternating marching  - 2 x daily - 6 x weekly - 1 sets - 10 reps - Standing Hip Abduction with Counter Support  - 2 x daily - 6 x weekly - 1 sets - 10 reps - Standing Lumbar Extension at Wall - Forearms  - 2 x daily - 6 x weekly - 1 sets - 10 reps - Mini Squat with Counter Support  - 2 x daily - 6 x weekly - 1 sets - 10 reps - Supine March with Resistance Band  - 2 x daily - 6 x weekly - 1 sets - 10 reps  ASSESSMENT:  CLINICAL IMPRESSION: She reported some improvement after DN last time so this was repeated followed by stretching and  strength program with good tolerance, she has now met short term PT goal but will need continued work to reach long term goals.    OBJECTIVE IMPAIRMENTS: Abnormal gait, decreased activity tolerance, decreased balance, decreased endurance, decreased mobility, difficulty walking, decreased ROM, decreased strength, increased edema, impaired flexibility, and pain.   ACTIVITY LIMITATIONS: carrying, lifting, bending, sitting, standing, squatting, stairs, and locomotion level  PARTICIPATION LIMITATIONS: meal prep, cleaning, laundry, driving, shopping, and community activity  PERSONAL FACTORS: Age, Fitness, Time since onset of injury/illness/exacerbation, and 3+ comorbidities: see above  are also affecting patient's functional outcome.   REHAB POTENTIAL: Good  CLINICAL DECISION MAKING: Stable/uncomplicated  EVALUATION COMPLEXITY: Low   GOALS: Goals reviewed with patient? Yes  SHORT TERM GOALS: (target date for Short term goals are 3 weeks 02/13/2024)   1.  Patient will demonstrate independent use of home exercise program to maintain progress from in clinic treatments.  Goal status: MET 02/07/24  LONG TERM GOALS: (target dates for all long term goals are 8 weeks  03/19/2024 )   1. Patient will demonstrate/report pain at worst less than or equal to 3/10 to facilitate minimal limitation in daily activity secondary to pain symptoms.  Goal status: ongoing 4/8/   2. Patient will improve PSFS to > or equal to 6.5/10 to show improved function.  Goal status: New   3. Patient will demonstrate Patient specific functional scale avg > or = 7 to indicate reduced disability due to condition.   Goal status: New   4.  Patient will demonstrate BLE MMT 4+/5 throughout to faciltiate usual transfers, stairs, squatting at [facility] for daily life.   Goal status: New   5.  Patient will demonstrate/report ability to perform 18 inch chair transfer s UE assist.  Goal status: New   6.  Patient will demonstrate  TUG </=13.5sec in effort to reduce risk of falls Goal status: New   7.  Patient will demonstrate up and down a flight of stairs with single hand rail with reciprocal gait pattern Goal Status: New   PLAN:  PT FREQUENCY: 1-2x/week  PT DURATION: 8 weeks  PLANNED INTERVENTIONS: Can include 78469- PT Re-evaluation, 97110-Therapeutic exercises, 97530- Therapeutic activity, [id number]- Neuromuscular re-education, 97535- Self Care, 97140- Manual therapy, [id number]- Gait training, [phone]- Orthotic Fit/training, [phone]- Canalith repositioning, [id number]- Aquatic Therapy, G4010- Electrical stimulation (unattended), [id number]- Electrical stimulation (manual), [id number]  Physical performance testing, 40981- Vasopneumatic device, [id number]- Ultrasound, [id number]- Traction (mechanical), [id number]- Ionotophoresis 4mg /ml Dexamethasone, Patient/Family education, Balance training, Stair training, Taping, Dry Needling, Joint mobilization, Joint manipulation, Spinal manipulation, Spinal mobilization, Scar mobilization, Vestibular training, Visual/preceptual remediation/compensation, DME instructions, Cryotherapy, and Moist heat.  All performed as medically necessary.  All included unless contraindicated  PLAN FOR NEXT SESSION: repeat DN with twisting if needed,  Hip flexor and abductor strengthening, standing activities which require pelvic stability in stance   Ivery Quale, PT, DPT 02/07/24 9:23 AM

## 2024-02-07 NOTE — Progress Notes (Signed)
 Office: [phone]  /  Fax: [phone]  WEIGHT SUMMARY AND BIOMETRICS  Anthropometric Measurements Height: 5\' 4"  (1.626 m) Weight: 187 lb (84.8 kg) BMI (Calculated): 32.08 Weight at Last Visit: 187 lb Weight Lost Since Last Visit: 0 Weight Gained Since Last Visit: 0 Total Weight Loss (lbs): 32 lb (14.5 kg)   Body Composition  Body Fat %: 47.4 % Fat Mass (lbs): 88.8 lbs Muscle Mass (lbs): 93.4 lbs Total Body Water (lbs): 73.4 lbs Visceral Fat Rating : 15   Other Clinical Data Fasting: no Labs: no Today's Visit #: 34    Chief Complaint: OBESITY   Discussed the use of AI scribe software for clinical note transcription with the patient, who gave verbal consent to proceed.  History of Present Illness Lauren [name] "[name]" is a 78 year old female who presents for assessment of her obesity treatment plan and progress evaluation.  She has been adhering to her category two obesity treatment plan about fifty percent of the time since her last visit three months ago. Despite challenges, she has maintained her weight. She struggles with cravings for sweets, which she attributes to boredom and being homebound post-surgery. She snacks on cheese crackers and candy but is attempting to incorporate more fruits like strawberries and grapes into her diet. She feels tired and lacking energy, associating this with insufficient protein intake. Her current diet includes yogurt and cereal with fruit in the mornings, eggs with bacon for dinner, and microwave meals with higher protein content in the evenings.  Approximately two months ago, she underwent hip replacement surgery and is currently in the recovery phase. She experiences hip flexion problems, particularly on the side that underwent surgery, affecting her ability to get in and out of her car. This led her to sell her previous car and purchase a [employer], which is easier for her to manage. She is undergoing physical therapy and has  had dry needling performed on both legs, which has left her feeling sore.  She has a history of vitamin D deficiency, which was previously over-replaced. She has been off vitamin D supplements and is due for lab checks.  She has a hx of allergic rhinitis and requests a refill of her Flonase  She is working on improving her HLD with diet and Crestor and requests a refill of her Crestor      PHYSICAL EXAM:  Blood pressure 125/71, pulse 65, temperature 97.9 F (36.6 C), height 5\' 4"  (1.626 m), weight 187 lb (84.8 kg), SpO2 99%. Body mass index is 32.1 kg/m.  DIAGNOSTIC DATA REVIEWED:  BMET    Component Value Date/Time   NA 130 (L) 12/07/2023 0736   NA 132 (L) 06/08/2023 0941   K 3.4 (L) 12/07/2023 0736   CL 95 (L) 12/07/2023 0736   CO2 25 12/07/2023 0736   GLUCOSE 118 (H) 12/07/2023 0736   BUN 15 12/07/2023 0736   BUN 14 06/08/2023 0941   CREATININE 0.97 12/07/2023 0736   CREATININE 0.87 06/08/2022 1439   CALCIUM 8.7 (L) 12/07/2023 0736   GFRNONAA 60 (L) 12/07/2023 0736   GFRNONAA >60 06/08/2022 1439   GFRAA >60 02/04/2020 1212   GFRAA >60 11/09/2018 1405   Lab Results  Component Value Date   HGBA1C 6.0 (H) 06/08/2023   HGBA1C 6.7 (H) 11/16/2016   Lab Results  Component Value Date   INSULIN 25.6 (H) 06/08/2023   INSULIN 22.3 04/22/2021   Lab Results  Component Value Date   TSH 2.150 05/11/2022  CBC    Component Value Date/Time   WBC 10.9 (H) 11/30/2023 1347   RBC 4.25 11/30/2023 1347   HGB 12.4 11/30/2023 1347   HGB 12.3 06/08/2022 1439   HGB 11.8 05/11/2022 0954   HCT 37.5 11/30/2023 1347   HCT 35.1 05/11/2022 0954   PLT 327 11/30/2023 1347   PLT 272 06/08/2022 1439   PLT 265 05/11/2022 0954   MCV 88.2 11/30/2023 1347   MCV 87 05/11/2022 0954   MCH 29.2 11/30/2023 1347   MCHC 33.1 11/30/2023 1347   RDW 13.3 11/30/2023 1347   RDW 13.2 05/11/2022 0954   Iron Studies No results found for: "IRON", "TIBC", "FERRITIN", "IRONPCTSAT" Lipid Panel      Component Value Date/Time   CHOL 123 06/08/2023 0941   TRIG 75 06/08/2023 0941   HDL 67 06/08/2023 0941   LDLCALC 41 06/08/2023 0941   Hepatic Function Panel     Component Value Date/Time   PROT 6.7 06/08/2023 0941   ALBUMIN 4.3 06/08/2023 0941   AST 19 06/08/2023 0941   AST 16 06/08/2022 1439   ALT 16 06/08/2023 0941   ALT 12 06/08/2022 1439   ALKPHOS 58 06/08/2023 0941   BILITOT 0.4 06/08/2023 0941   BILITOT 0.4 06/08/2022 1439   BILIDIR <0.1 (L) 07/30/2015 1338   IBILI NOT CALCULATED 07/30/2015 1338      Component Value Date/Time   TSH 2.150 05/11/2022 0954   Nutritional Lab Results  Component Value Date   VD25OH 124.0 (H) 09/22/2023   VD25OH 100.0 06/08/2023   VD25OH 55.8 12/01/2022     Assessment and Plan Assessment & Plan Post-operative recovery from hip replacement Recovering from hip replacement surgery performed two months ago with hip flexion issues and soreness, particularly post-physical therapy sessions including dry needling. Attending physical therapy twice weekly and advised to start chair yoga but avoid pool exercises until three months post-surgery. Emphasized the importance of avoiding overexertion to prevent complications. - Continue physical therapy twice a week - Start chair yoga - Avoid pool exercises until three months post-surgery  Obesity and DM II and Emotional Eating Behaviors Struggling with weight maintenance post-hip replacement surgery. Following category two plan about 50% of the time, maintaining weight better than expected post-surgery. Experiencing strong cravings for sweets, likely due to negative emotions and boredom. Emphasized protein intake to manage cravings, support healing, and prevent fatigue. Discussed neurotransmitters' role in cravings and protein as a "real medicine" to prevent carbohydrate cravings. - Encourage protein intake of 80 grams or more per day - Advise on calorie intake of 1200-1300 calories per day - Suggest  using protein supplements if necessary - Provide recipe for high protein pudding - Refill Mounjaro and increase dose from 5 mg to 7.5 mg - Refill Lexapro  Hyperlipidemia Requires Crestor (rosuvastatin) refill to manage cholesterol levels. Agreed to refill for continuity of care. - Refill Crestor (rosuvastatin)  Vitamin D deficiency Vitamin D deficiency previously over-replaced. Currently off supplements and due for lab work to assess levels. - Order vitamin D level test  Allergic rhinitis Experiencing allergy symptoms, likely exacerbated by oak trees around her home. - Refill allergy medication  Follow-up - Schedule follow-up appointment in four weeks  45 minutes was spent in education and counseling for the above health issues. Significant time was spent on nutritional education and helping with meal planning and documentation of this note.    She was informed of the importance of frequent follow up visits to maximize her success with intensive lifestyle modifications  for her multiple health conditions.    [name], MD

## 2024-02-08 LAB — VITAMIN D 25 HYDROXY (VIT D DEFICIENCY, FRACTURES): Vit D, 25-Hydroxy: 57.5 ng/mL (ref 30.0–100.0)

## 2024-02-09 ENCOUNTER — Encounter: Admitting: Physical Therapy

## 2024-02-13 DIAGNOSIS — M47817 Spondylosis without myelopathy or radiculopathy, lumbosacral region: Secondary | ICD-10-CM | POA: Diagnosis not present

## 2024-02-13 DIAGNOSIS — M150 Primary generalized (osteo)arthritis: Secondary | ICD-10-CM | POA: Diagnosis not present

## 2024-02-13 DIAGNOSIS — M25551 Pain in right hip: Secondary | ICD-10-CM | POA: Diagnosis not present

## 2024-02-13 DIAGNOSIS — M25552 Pain in left hip: Secondary | ICD-10-CM | POA: Diagnosis not present

## 2024-02-14 ENCOUNTER — Ambulatory Visit: Admitting: Physical Therapy

## 2024-02-14 ENCOUNTER — Encounter: Payer: Self-pay | Admitting: Physical Therapy

## 2024-02-14 DIAGNOSIS — M25552 Pain in left hip: Secondary | ICD-10-CM | POA: Diagnosis not present

## 2024-02-14 DIAGNOSIS — R262 Difficulty in walking, not elsewhere classified: Secondary | ICD-10-CM | POA: Diagnosis not present

## 2024-02-14 DIAGNOSIS — M25551 Pain in right hip: Secondary | ICD-10-CM

## 2024-02-14 DIAGNOSIS — M6281 Muscle weakness (generalized): Secondary | ICD-10-CM

## 2024-02-14 NOTE — Therapy (Signed)
 OUTPATIENT PHYSICAL THERAPY TREATMENT   Patient Name: Lauren [name] MRN: [id number] DOB:[birth year], 78 y.o., female Today's Date: 02/14/2024  END OF SESSION:  PT End of Session - 02/14/24 0930     Visit Number 5    Number of Visits 16    Date for PT Re-Evaluation 03/19/24    Authorization Type [employer] $10 copay    Progress Note Due on Visit 10    PT Start Time 0930    PT Stop Time 1008    PT Time Calculation (min) 38 min    Activity Tolerance Patient tolerated treatment well    Behavior During Therapy [facility] for tasks assessed/performed               Past Medical History:  Diagnosis Date   Anxiety    Arthritis    Back, Hip- right   Atypical chest pain 06/25/2021   Back pain    Cancer (HCC)    right breast   Cataract    Colitis    Coronary artery calcification 02/20/2020   Diabetes mellitus without complication (HCC)    Type II   Edema 03/05/2021   Edema of both lower extremities    Endometriosis    Family history of adverse reaction to anesthesia    Father - N/V - blood pressure; daughter N/V   Family history of breast cancer    Family history of colon cancer    Family history of kidney cancer    Family history of prostate cancer    Fatty liver    GERD (gastroesophageal reflux disease)    History of hiatal hernia    History of kidney stones    History of ulcerative colitis    Hypertension    Insomnia    Joint pain    Kidney problem    Lower extremity edema 03/05/2021   Lumbar disc disease    L4 L5   Neuropathy    Osteoarthritis    Other hyperlipidemia    Palpitations    Personal history of radiation therapy    Pneumonia    1983, 2003   Pure hypercholesterolemia 02/20/2020   PVCs (premature ventricular contractions)    benign   Umbilical hernia    Vitamin D deficiency    Wears glasses    Past Surgical History:  Procedure Laterality Date   APPENDECTOMY     BREAST BIOPSY  08-13-2009  DR TSUEI   EXCISION LEFT NIPPLE DUCT   BREAST  EXCISIONAL BIOPSY Left    x2   BREAST EXCISIONAL BIOPSY Right    BREAST LUMPECTOMY Right 11/16/2018   invasive ductal   CHOLECYSTECTOMY  1992   COLON RESECTION  1986   ULCERATIVE COLITIS   COLON SURGERY     ESOPHAGEAL MANOMETRY N/A 06/11/2013   Procedure: ESOPHAGEAL MANOMETRY (EM);  Surgeon: Petra Kuba, MD;  Location: [facility] ENDOSCOPY;  Service: Endoscopy;  Laterality: N/A;   ESOPHAGOGASTRODUODENOSCOPY (EGD) WITH PROPOFOL N/A 05/31/2016   Procedure: ESOPHAGOGASTRODUODENOSCOPY (EGD) WITH PROPOFOL;  Surgeon: Vida Rigger, MD;  Location: [facility] ENDOSCOPY;  Service: Endoscopy;  Laterality: N/A;   EXCISION RIGHT BREAST MASS   10-20-2005  DR Theron Arista YOUNG   EYE SURGERY Bilateral 2023   cataract   FRACTURE SURGERY     left arm   kidney stone removed  12/2009   KNEE ARTHROSCOPY Left    MCL   LEFT THUMB CARPOMETACARPAL JOINT SUSPENSIONPLASTY  04-06-2006  DR Mina Marble   LEFT WRIST ARTHROSCOPY W/ DEBRIDEMENT AND REMOVAL CYST  03-26-2004  DR Donzella Galley   MYOMECTOMY     RADIOACTIVE SEED GUIDED PARTIAL MASTECTOMY WITH AXILLARY SENTINEL LYMPH NODE BIOPSY Right 11/16/2018   Procedure: RIGHT BREAST RADIOACTIVE SEED GUIDED PARTIAL MASTECTOMY WITH  SENTINEL  NODE BIOPSY;  Surgeon: Oza Blumenthal, MD;  Location: [facility] OR;  Service: General;  Laterality: Right;   RIGHT COLECTOMY     RIGHT URETEROSCOPIC STONE EXTRACTION  12-11-2009  DR Autry [facility]   TENDON RELEASE     TONSILLECTOMY     TOTAL HIP ARTHROPLASTY Right 11/26/2016   Procedure: RIGHT TOTAL HIP ARTHROPLASTY ANTERIOR APPROACH;  Surgeon: Arnie Lao, MD;  Location: [facility] ORS;  Service: Orthopedics;  Laterality: Right;   TOTAL HIP ARTHROPLASTY Left 12/06/2023   Procedure: LEFT TOTAL HIP ARTHROPLASTY ANTERIOR APPROACH;  Surgeon: Arnie Lao, MD;  Location: [facility] OR;  Service: Orthopedics;  Laterality: Left;   TOTAL KNEE ARTHROPLASTY Left 12/02/2017   Procedure: LEFT TOTAL KNEE ARTHROPLASTY;  Surgeon: Arnie Lao, MD;  Location: [facility] ORS;   Service: Orthopedics;  Laterality: Left;   TOTAL KNEE ARTHROPLASTY Right 08/03/2022   Procedure: RIGHT TOTAL KNEE ARTHROPLASTY;  Surgeon: Arnie Lao, MD;  Location: [facility] OR;  Service: Orthopedics;  Laterality: Right;   WRIST SURGERY     both   Patient Active Problem List   Diagnosis Date Noted   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/2023   Stress incontinence 11/22/2023   Restless leg syndrome 09/22/2023   Other hyperlipidemia 06/08/2023   Unilateral primary osteoarthritis, left hip 05/11/2023   Depression 03/16/2023   Allergic rhinitis 12/29/2022   Seasonal affective disorder (HCC) 12/01/2022   BMI 32.0-32.9,adult 12/01/2022   Obesity, Beginning BMI 37.59 12/01/2022   OA (osteoarthritis) of knee 08/03/2022   Status post total right knee replacement 08/03/2022   Stress 07/19/2022   Other constipation 07/19/2022   Atypical chest pain 06/25/2021   Class 2 severe obesity with serious comorbidity and body mass index (BMI) of 37.0 to 37.9 in adult (HCC) 06/03/2021   Vitamin D deficiency 06/03/2021   Other fatigue 04/22/2021   SOB (shortness of breath) on exertion 04/22/2021   Lower extremity edema 03/05/2021   Coronary artery calcification 02/20/2020   Pure hypercholesterolemia 02/20/2020   Unilateral primary osteoarthritis, right knee 01/03/2019   Genetic testing 12/07/2018   Family history of breast cancer    Family history of prostate cancer    Family history of colon cancer    Family history of kidney cancer    Malignant neoplasm of upper-outer quadrant of right breast in female, 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/09/2018   Diabetic neuropathy, type II diabetes mellitus (HCC) 11/09/2018   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/02/2017   Trochanteric bursitis, right hip 05/10/2017   Status post total replacement of right hip 11/26/2016   DDD (degenerative disc disease), 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   Low back pain 04/23/2015   Hiatal hernia 04/13/2013    Diabetes mellitus (HCC) 01/26/2013   Umbilical hernia 07/24/2012   Essential hypertension     PCP: Jimmey Mould, MD   REFERRING PROVIDER: Shauna Del, DO   REFERRING DIAG:  Z61.09 (ICD-10-CM) - Proximal limb muscle weakness  M25.551,M25.552 (ICD-10-CM) - Bilateral hip pain  Z96.642 (ICD-10-CM) - Status post total replacement of left hip  Z96.641 (ICD-10-CM) - Status post total replacement of right hip    THERAPY DIAG:  Pain in left hip  Pain in right hip  Muscle weakness (generalized)  Difficulty in walking, not elsewhere classified  Rationale for Evaluation and  Treatment: Rehabilitation  ONSET DATE: Chronic pain , Patient 7wks s/p L THA 12/06/2023  SUBJECTIVE:   SUBJECTIVE STATEMENT: She felt the DN was helpful at least for some time. She does have a sensitive place noted in left anterior hip area today. She reports still with some difficulty getting in/out of car due to picking her leg up, as well as some difficulty walking in straight line, she tends to vier left or right   PERTINENT HISTORY: R THA, L TKA, anxiety, DM  PAIN:  NPRS scale: 4/10 today Pain location: crease of hip/thigh, R>L Pain description: sharp, achy, weakness Aggravating factors: being on feet for a while Relieving factors: ice, rest, pain meds  PRECAUTIONS: Anterior hip  WEIGHT BEARING RESTRICTIONS: No  FALLS:  Has patient fallen in last 6 months? No  LIVING ENVIRONMENT: Lives with: lives alone, 4 mo old basset hound Lives in: House/apartment Stairs: Yes: Internal: 1 steps; going from den to [employer] Has following equipment at home: Single point cane  OCCUPATION: retired- Chiropodist  PLOF: Independent  PATIENT GOALS: to be able to be in less pain/tolerate things better  Next MD visit: 02/21/2024 Dr. Shon Baton   OBJECTIVE:   DIAGNOSTIC FINDINGS:  Left hip arthroplasty in expected alignment. No periprosthetic lucency or fracture. Recent postsurgical change includes air  and edema in the soft tissues. Lateral skin staples in place. A right hip arthroplasty.   IMPRESSION: Left hip arthroplasty without immediate postoperative complication.    PATIENT SURVEYS:  Patient-Specific Activity Scoring Scheme  "0" represents "unable to perform." "10" represents "able to perform at prior level. 0 1 2 3 4 5 6 7 8 9  10 (Date and Score)   Activity Eval     1. Stairs 5    2. Getting in/out cars 4    3. Getting in/out bed 4   4.    5.    Score 4.33/10    Total score = sum of the activity scores/number of activities Minimum detectable change (90%CI) for average score = 2 points Minimum detectable change (90%CI) for single activity score = 3 points  COGNITION: Overall cognitive status: WFL    SENSATION: WFL  EDEMA:  Not present  MUSCLE LENGTH: Not tested  POSTURE:  No Significant postural limitations  PALPATION: Tender to Lt hip flexor tendon and anteriomedial quadricep, Rt hip flexor tendon and medial quadricep  LOWER EXTREMITY ROM:   ROM Right eval Left eval  Hip flexion 100 80  Hip extension    Hip abduction    Hip adduction    Hip internal rotation    Hip external rotation    Knee flexion    Knee extension    Ankle dorsiflexion    Ankle plantarflexion    Ankle inversion    Ankle eversion     (Blank rows = not tested)  LOWER EXTREMITY MMT:  MMT Right eval Left eval Right 02/14/24 Left 02/14/24  Hip flexion 3+ 3- 4 4-  Hip extension      Hip abduction      Hip adduction      Hip internal rotation      Hip external rotation      Knee flexion 4 4-; painful 4+ 4+  Knee extension 3+; painful 3+; painful 4+ 4+  Ankle dorsiflexion 5 5    Ankle plantarflexion      Ankle inversion      Ankle eversion       (Blank rows = not tested)  LOWER EXTREMITY SPECIAL  TESTS:  Hip: none Back: (+) SLR bilateral   FUNCTIONAL TESTS:  01/23/24 18 inch chair transfer: is not able to rise to standing without use of UE on armrest and  rocking for momentum TUG: 16.78 with use of UE on armrest GAIT: Distance walked: clinical distance Assistive device utilized: None Level of assistance: SBA Comments: antalgic. Decreased Lt LE stance time, narrow step width, decreased step length, decreased speed                                                                                                                                                                        TODAY'S TREATMENT 02/14/24 TherAct Standing marching x10 reps blat L3 band Standing abduction 2x10 reps with L3 band bil; UE support needed Standing hip extension green band 2X10 bilat Seated marching over cone to simulate picking leg up to get in and out of car, 3 sets of 5 over and back Tandem walk in bars without UE support to improve balance for walking in straight line 4 round trips in bars Stepping over yoga block forward and back, X 10 bilat in bars without UE support Therex Nu step L6 X 8 min   Manual STM and IASTM to Rt anterior hip along incision; skilled palpation and monitoring of soft tissue during DN Trigger Point Dry Needling Subsequent Treatment: Instructions provided previously at initial dry needling treatment.  Patient Verbal Consent Given: Yes Education Handout Provided: Previously Provided Muscles Treated: Proximal anterior and lateral quads Rt Electrical Stimulation Performed: No Treatment Response/Outcome:  good overall tolerance,twitch response noted   02/07/24 TherAct Standing marching x10 reps blat L3 band, then standing SLR with L3 band bil; UE support needed Standing abduction 2x10 reps with L3 band bil; UE support needed Standing hip extension green band 2X10 bilat Therex Nu step L6 X 8 min Supine quad stretch with strap 30 sec X 3 bilat  Manual STM and IASTM to Rt anterior hip along incision; skilled palpation and monitoring of soft tissue during DN Trigger Point Dry Needling Subsequent Treatment: Instructions provided  previously at initial dry needling treatment.  Patient Verbal Consent Given: Yes Education Handout Provided: Previously Provided Muscles Treated: Proximal quads Rt and left Electrical Stimulation Performed: No Treatment Response/Outcome:  good overall tolerance,twitch response noted    02/01/24 TherAct Standing marching 2x10 reps with L3 band bil; UE support needed Standing abduction 2x10 reps with L3 band bil; UE support needed Squats 2x10; min cues for technique  Manual STM and IASTM to Rt anterior hip along incision; skilled palpation and monitoring of soft tissue during DN  Trigger Point Dry Needling  Initial Treatment: Pt instructed on Dry Needling rational, procedures, and possible side effects. Pt instructed to expect mild to moderate muscle  soreness later in the day and/or into the next day.  Pt instructed in methods to reduce muscle soreness. Pt instructed to continue prescribed HEP. Patient was educated on signs and symptoms of infection and other risk factors and advised to seek medical attention should they occur.  Patient verbalized understanding of these instructions and education.   Patient Verbal Consent Given: Yes Education Handout Provided: No has had previous episodes of DN, added to pt instructions to access in MyChart Muscles Treated: Rt proximal quad and scar tissue at Rt THA incision Electrical Stimulation Performed: No Treatment Response/Outcome: decreased tightness; twisting technique with needles along incision which she tolerated well  PATIENT EDUCATION:  Education details: HEP, POC Person educated: Patient Education method: Programmer, multimedia, Demonstration, Verbal cues, and Handouts Education comprehension: verbalized understanding, returned demonstration, and verbal cues required  HOME EXERCISE PROGRAM: Access Code: [email or web address] URL: [email or web address] Date: 01/25/2024 Prepared by: Moshe Cipro  Exercises - Supine Hip Abduction  - 2  x daily - 6 x weekly - 1 sets - 10 reps - alternating marching  - 2 x daily - 6 x weekly - 1 sets - 10 reps - Standing Hip Abduction with Counter Support  - 2 x daily - 6 x weekly - 1 sets - 10 reps - Standing Lumbar Extension at Wall - Forearms  - 2 x daily - 6 x weekly - 1 sets - 10 reps - Mini Squat with Counter Support  - 2 x daily - 6 x weekly - 1 sets - 10 reps - Supine March with Resistance Band  - 2 x daily - 6 x weekly - 1 sets - 10 reps  -added seated march over object, standing march over object, and tandem walking 02/14/24 to help with getting in/out of car as well as balance for walking in straight line reducing staggering to right or left during ambulation. She denies needing picture of these.  ASSESSMENT:  CLINICAL IMPRESSION: DN still helpful so continued with this today. PT added seated march over object, standing march over object, and tandem walking to HEP to help with getting in/out of car as well as balance for walking in straight line reducing staggering to right or left during ambulation. She denies needing pictures of these. Hip/knee strength measurements showed improvements but still limited.   OBJECTIVE IMPAIRMENTS: Abnormal gait, decreased activity tolerance, decreased balance, decreased endurance, decreased mobility, difficulty walking, decreased ROM, decreased strength, increased edema, impaired flexibility, and pain.   ACTIVITY LIMITATIONS: carrying, lifting, bending, sitting, standing, squatting, stairs, and locomotion level  PARTICIPATION LIMITATIONS: meal prep, cleaning, laundry, driving, shopping, and community activity  PERSONAL FACTORS: Age, Fitness, Time since onset of injury/illness/exacerbation, and 3+ comorbidities: see above  are also affecting patient's functional outcome.   REHAB POTENTIAL: Good  CLINICAL DECISION MAKING: Stable/uncomplicated  EVALUATION COMPLEXITY: Low   GOALS: Goals reviewed with patient? Yes  SHORT TERM GOALS: (target date  for Short term goals are 3 weeks 02/13/2024)   1.  Patient will demonstrate independent use of home exercise program to maintain progress from in clinic treatments.  Goal status: MET 02/07/24  LONG TERM GOALS: (target dates for all long term goals are 8 weeks  03/19/2024 )   1. Patient will demonstrate/report pain at worst less than or equal to 3/10 to facilitate minimal limitation in daily activity secondary to pain symptoms.  Goal status: ongoing 4/8/   2. Patient will improve PSFS to > or equal to 6.5/10 to show improved function.  Goal status: New  3. Patient will demonstrate Patient specific functional scale avg > or = 7 to indicate reduced disability due to condition.   Goal status: New   4.  Patient will demonstrate BLE MMT 4+/5 throughout to faciltiate usual transfers, stairs, squatting at [facility] for daily life.   Goal status: New   5.  Patient will demonstrate/report ability to perform 18 inch chair transfer s UE assist.  Goal status: New   6.  Patient will demonstrate TUG </=13.5sec in effort to reduce risk of falls Goal status: New   7.  Patient will demonstrate up and down a flight of stairs with single hand rail with reciprocal gait pattern Goal Status: New   PLAN:  PT FREQUENCY: 1-2x/week  PT DURATION: 8 weeks  PLANNED INTERVENTIONS: Can include 16109- PT Re-evaluation, 97110-Therapeutic exercises, 97530- Therapeutic activity, 97112- Neuromuscular re-education, 97535- Self Care, 97140- Manual therapy, [phone]- Gait training, [phone]- Orthotic Fit/training, [phone]- Canalith repositioning, [id number]- Aquatic Therapy, [phone]- Electrical stimulation (unattended), [phone]- Electrical stimulation (manual), [id number] Physical performance testing, 97016- Vasopneumatic device, [id number]- Ultrasound, [id number]- Traction (mechanical), [id number]- Ionotophoresis 4mg /ml Dexamethasone, Patient/Family education, Balance training, Stair training, Taping, Dry Needling, Joint mobilization, Joint manipulation,  Spinal manipulation, Spinal mobilization, Scar mobilization, Vestibular training, Visual/preceptual remediation/compensation, DME instructions, Cryotherapy, and Moist heat.  All performed as medically necessary.  All included unless contraindicated  PLAN FOR NEXT SESSION: repeat DN if desired. Work on hip flexion strength and balance.    Jamee Mazzoni, PT, DPT 02/14/24 9:31 AM

## 2024-02-16 ENCOUNTER — Ambulatory Visit: Admitting: Physical Therapy

## 2024-02-16 ENCOUNTER — Encounter: Payer: Self-pay | Admitting: Physical Therapy

## 2024-02-16 DIAGNOSIS — M25551 Pain in right hip: Secondary | ICD-10-CM | POA: Diagnosis not present

## 2024-02-16 DIAGNOSIS — R262 Difficulty in walking, not elsewhere classified: Secondary | ICD-10-CM

## 2024-02-16 DIAGNOSIS — M25552 Pain in left hip: Secondary | ICD-10-CM | POA: Diagnosis not present

## 2024-02-16 DIAGNOSIS — M6281 Muscle weakness (generalized): Secondary | ICD-10-CM | POA: Diagnosis not present

## 2024-02-16 NOTE — Therapy (Signed)
 OUTPATIENT PHYSICAL THERAPY TREATMENT   Patient Name: Lauren [name] MRN: [id number] DOB:May 03, 1946, 78 y.o., female Today's Date: 02/16/2024  END OF SESSION:  PT End of Session - 02/16/24 1319     Visit Number 6    Number of Visits 16    Date for PT Re-Evaluation 03/19/24    Authorization Type [employer] $10 copay    Progress Note Due on Visit 10    PT Start Time 1305    PT Stop Time 1338    PT Time Calculation (min) 33 min    Activity Tolerance Patient tolerated treatment well    Behavior During Therapy [facility] for tasks assessed/performed                Past Medical History:  Diagnosis Date   Anxiety    Arthritis    Back, Hip- right   Atypical chest pain 06/25/2021   Back pain    Cancer (HCC)    right breast   Cataract    Colitis    Coronary artery calcification 02/20/2020   Diabetes mellitus without complication (HCC)    Type II   Edema 03/05/2021   Edema of both lower extremities    Endometriosis    Family history of adverse reaction to anesthesia    Father - N/V - blood pressure; daughter N/V   Family history of breast cancer    Family history of colon cancer    Family history of kidney cancer    Family history of prostate cancer    Fatty liver    GERD (gastroesophageal reflux disease)    History of hiatal hernia    History of kidney stones    History of ulcerative colitis    Hypertension    Insomnia    Joint pain    Kidney problem    Lower extremity edema 03/05/2021   Lumbar disc disease    L4 L5   Neuropathy    Osteoarthritis    Other hyperlipidemia    Palpitations    Personal history of radiation therapy    Pneumonia    1983, 2003   Pure hypercholesterolemia 02/20/2020   PVCs (premature ventricular contractions)    benign   Umbilical hernia    Vitamin D deficiency    Wears glasses    Past Surgical History:  Procedure Laterality Date   APPENDECTOMY     BREAST BIOPSY  08-13-2009  DR TSUEI   EXCISION LEFT NIPPLE DUCT   BREAST  EXCISIONAL BIOPSY Left    x2   BREAST EXCISIONAL BIOPSY Right    BREAST LUMPECTOMY Right 11/16/2018   invasive ductal   CHOLECYSTECTOMY  1992   COLON RESECTION  1986   ULCERATIVE COLITIS   COLON SURGERY     ESOPHAGEAL MANOMETRY N/A 06/11/2013   Procedure: ESOPHAGEAL MANOMETRY (EM);  Surgeon: Mathew Solomon, MD;  Location: [facility] ENDOSCOPY;  Service: Endoscopy;  Laterality: N/A;   ESOPHAGOGASTRODUODENOSCOPY (EGD) WITH PROPOFOL N/A 05/31/2016   Procedure: ESOPHAGOGASTRODUODENOSCOPY (EGD) WITH PROPOFOL;  Surgeon: Ozell Blunt, MD;  Location: [facility] ENDOSCOPY;  Service: Endoscopy;  Laterality: N/A;   EXCISION RIGHT BREAST MASS   10-20-2005  DR Donata Fryer YOUNG   EYE SURGERY Bilateral 2023   cataract   FRACTURE SURGERY     left arm   kidney stone removed  12/2009   KNEE ARTHROSCOPY Left    MCL   LEFT THUMB CARPOMETACARPAL JOINT SUSPENSIONPLASTY  04-06-2006  DR Donzella Galley   LEFT WRIST ARTHROSCOPY W/ DEBRIDEMENT AND REMOVAL CYST  03-26-2004  DR Donzella Galley   MYOMECTOMY     RADIOACTIVE SEED GUIDED PARTIAL MASTECTOMY WITH AXILLARY SENTINEL LYMPH NODE BIOPSY Right 11/16/2018   Procedure: RIGHT BREAST RADIOACTIVE SEED GUIDED PARTIAL MASTECTOMY WITH  SENTINEL  NODE BIOPSY;  Surgeon: Oza Blumenthal, MD;  Location: [facility] OR;  Service: General;  Laterality: Right;   RIGHT COLECTOMY     RIGHT URETEROSCOPIC STONE EXTRACTION  12-11-2009  DR Autry [facility]   TENDON RELEASE     TONSILLECTOMY     TOTAL HIP ARTHROPLASTY Right 11/26/2016   Procedure: RIGHT TOTAL HIP ARTHROPLASTY ANTERIOR APPROACH;  Surgeon: Arnie Lao, MD;  Location: [facility] ORS;  Service: Orthopedics;  Laterality: Right;   TOTAL HIP ARTHROPLASTY Left 12/06/2023   Procedure: LEFT TOTAL HIP ARTHROPLASTY ANTERIOR APPROACH;  Surgeon: Arnie Lao, MD;  Location: [facility] OR;  Service: Orthopedics;  Laterality: Left;   TOTAL KNEE ARTHROPLASTY Left 12/02/2017   Procedure: LEFT TOTAL KNEE ARTHROPLASTY;  Surgeon: Arnie Lao, MD;  Location: [facility] ORS;   Service: Orthopedics;  Laterality: Left;   TOTAL KNEE ARTHROPLASTY Right 08/03/2022   Procedure: RIGHT TOTAL KNEE ARTHROPLASTY;  Surgeon: Arnie Lao, MD;  Location: [facility] OR;  Service: Orthopedics;  Laterality: Right;   WRIST SURGERY     both   Patient Active Problem List   Diagnosis Date Noted   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/2023   Stress incontinence 11/22/2023   Restless leg syndrome 09/22/2023   Other hyperlipidemia 06/08/2023   Unilateral primary osteoarthritis, left hip 05/11/2023   Depression 03/16/2023   Allergic rhinitis 12/29/2022   Seasonal affective disorder (HCC) 12/01/2022   BMI 32.0-32.9,adult 12/01/2022   Obesity, Beginning BMI 37.59 12/01/2022   OA (osteoarthritis) of knee 08/03/2022   Status post total right knee replacement 08/03/2022   Stress 07/19/2022   Other constipation 07/19/2022   Atypical chest pain 06/25/2021   Class 2 severe obesity with serious comorbidity and body mass index (BMI) of 37.0 to 37.9 in adult (HCC) 06/03/2021   Vitamin D deficiency 06/03/2021   Other fatigue 04/22/2021   SOB (shortness of breath) on exertion 04/22/2021   Lower extremity edema 03/05/2021   Coronary artery calcification 02/20/2020   Pure hypercholesterolemia 02/20/2020   Unilateral primary osteoarthritis, right knee 01/03/2019   Genetic testing 12/07/2018   Family history of breast cancer    Family history of prostate cancer    Family history of colon cancer    Family history of kidney cancer    Malignant neoplasm of upper-outer quadrant of right breast in female, 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/09/2018   Diabetic neuropathy, type II diabetes mellitus (HCC) 11/09/2018   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/02/2017   Trochanteric bursitis, right hip 05/10/2017   Status post total replacement of right hip 11/26/2016   DDD (degenerative disc disease), 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   Low back pain 04/23/2015   Hiatal hernia 04/13/2013    Diabetes mellitus (HCC) 01/26/2013   Umbilical hernia 07/24/2012   Essential hypertension     PCP: Jimmey Mould, MD   REFERRING PROVIDER: Shauna Del, DO   REFERRING DIAG:  W09.81 (ICD-10-CM) - Proximal limb muscle weakness  M25.551,M25.552 (ICD-10-CM) - Bilateral hip pain  Z96.642 (ICD-10-CM) - Status post total replacement of left hip  Z96.641 (ICD-10-CM) - Status post total replacement of right hip    THERAPY DIAG:  Pain in left hip  Pain in right hip  Muscle weakness (generalized)  Difficulty in walking, not elsewhere classified  Rationale for Evaluation and  Treatment: Rehabilitation  ONSET DATE: Chronic pain , Patient 7wks s/p L THA 12/06/2023  SUBJECTIVE:   SUBJECTIVE STATEMENT: Some soreness from new exercises last time.   PERTINENT HISTORY: R THA, L TKA, anxiety, DM  PAIN:  NPRS scale: 4/10 today Pain location: crease of hip/thigh, R>L Pain description: sharp, achy, weakness Aggravating factors: being on feet for a while Relieving factors: ice, rest, pain meds  PRECAUTIONS: Anterior hip  WEIGHT BEARING RESTRICTIONS: No  FALLS:  Has patient fallen in last 6 months? No  LIVING ENVIRONMENT: Lives with: lives alone, 4 mo old basset hound Lives in: House/apartment Stairs: Yes: Internal: 1 steps; going from den to [employer] Has following equipment at home: Single point cane  OCCUPATION: retired- Chiropodist  PLOF: Independent  PATIENT GOALS: to be able to be in less pain/tolerate things better  Next MD visit: 02/21/2024 Dr. Shon Baton   OBJECTIVE:   DIAGNOSTIC FINDINGS:  Left hip arthroplasty in expected alignment. No periprosthetic lucency or fracture. Recent postsurgical change includes air and edema in the soft tissues. Lateral skin staples in place. A right hip arthroplasty.   IMPRESSION: Left hip arthroplasty without immediate postoperative complication.    PATIENT SURVEYS:  Patient-Specific Activity Scoring Scheme  "0" represents  "unable to perform." "10" represents "able to perform at prior level. 0 1 2 3 4 5 6 7 8 9  10 (Date and Score)   Activity Eval     1. Stairs 5    2. Getting in/out cars 4    3. Getting in/out bed 4   4.    5.    Score 4.33/10    Total score = sum of the activity scores/number of activities Minimum detectable change (90%CI) for average score = 2 points Minimum detectable change (90%CI) for single activity score = 3 points  COGNITION: Overall cognitive status: WFL    SENSATION: WFL  EDEMA:  Not present  MUSCLE LENGTH: Not tested  POSTURE:  No Significant postural limitations  PALPATION: Tender to Lt hip flexor tendon and anteriomedial quadricep, Rt hip flexor tendon and medial quadricep  LOWER EXTREMITY ROM:   ROM Right eval Left eval  Hip flexion 100 80  Hip extension    Hip abduction    Hip adduction    Hip internal rotation    Hip external rotation    Knee flexion    Knee extension    Ankle dorsiflexion    Ankle plantarflexion    Ankle inversion    Ankle eversion     (Blank rows = not tested)  LOWER EXTREMITY MMT:  MMT Right eval Left eval Right 02/14/24 Left 02/14/24  Hip flexion 3+ 3- 4 4-  Hip extension      Hip abduction      Hip adduction      Hip internal rotation      Hip external rotation      Knee flexion 4 4-; painful 4+ 4+  Knee extension 3+; painful 3+; painful 4+ 4+  Ankle dorsiflexion 5 5    Ankle plantarflexion      Ankle inversion      Ankle eversion       (Blank rows = not tested)  LOWER EXTREMITY SPECIAL TESTS:  Hip: none Back: (+) SLR bilateral   FUNCTIONAL TESTS:  01/23/24 18 inch chair transfer: is not able to rise to standing without use of UE on armrest and rocking for momentum TUG: 16.78 with use of UE on armrest GAIT: Distance walked: clinical distance Assistive  device utilized: None Level of assistance: SBA Comments: antalgic. Decreased Lt LE stance time, narrow step width, decreased step length, decreased  speed                                                                                                                                                                        TODAY'S TREATMENT 02/16/24 TherAct Standing marching x10 reps blat L3 band Standing abduction 2x10 reps with L3 band bil; UE support needed Standing hip extension green band 2X10 bilat Seated marching over cone to simulate picking leg up to get in and out of car, 3 sets of 5 over and back Tandem walk in bars without UE support to improve balance for walking in straight line 4 round trips in bars Stepping over yoga block forward and back, X 10 bilat in bars without UE support Therex Nu step L6 X 8 min  02/14/24 TherAct Standing marching x10 reps blat L3 band Standing abduction 2x10 reps with L3 band bil; UE support needed Standing hip extension green band 2X10 bilat Seated marching over cone to simulate picking leg up to get in and out of car, 3 sets of 5 over and back Tandem walk in bars without UE support to improve balance for walking in straight line 4 round trips in bars Stepping over yoga block forward and back, X 10 bilat in bars without UE support Therex Nu step L6 X 8 min   Manual STM and IASTM to Rt anterior hip along incision; skilled palpation and monitoring of soft tissue during DN Trigger Point Dry Needling Subsequent Treatment: Instructions provided previously at initial dry needling treatment.  Patient Verbal Consent Given: Yes Education Handout Provided: Previously Provided Muscles Treated: Proximal anterior and lateral quads Rt Electrical Stimulation Performed: No Treatment Response/Outcome:  good overall tolerance,twitch response noted     PATIENT EDUCATION:  Education details: HEP, POC Person educated: Patient Education method: Programmer, multimedia, Facilities manager, Verbal cues, and Handouts Education comprehension: verbalized understanding, returned demonstration, and verbal cues required  HOME  EXERCISE PROGRAM: Access Code: [email or web address] URL: [email or web address] Date: 01/25/2024 Prepared by: Casimer Clear  Exercises - Supine Hip Abduction  - 2 x daily - 6 x weekly - 1 sets - 10 reps - alternating marching  - 2 x daily - 6 x weekly - 1 sets - 10 reps - Standing Hip Abduction with Counter Support  - 2 x daily - 6 x weekly - 1 sets - 10 reps - Standing Lumbar Extension at Wall - Forearms  - 2 x daily - 6 x weekly - 1 sets - 10 reps - Mini Squat with Counter Support  - 2 x daily - 6 x weekly - 1 sets - 10 reps - Supine March with  Resistance Band  - 2 x daily - 6 x weekly - 1 sets - 10 reps  -added seated march over object, standing march over object, and tandem walking 02/14/24 to help with getting in/out of car as well as balance for walking in straight line reducing staggering to right or left during ambulation. She denies needing picture of these.  ASSESSMENT:  CLINICAL IMPRESSION: Shorter session today as she was sore from last time and she declined having the manual therapy at end today. Despite soreness she did have some improved performance with seated hip flexion activity to simulate getting in/out of car today. PT recommending to continue with current PT plan, she has 4 more visits scheduled then we will assess if she is ready to transition to independent program after that.    OBJECTIVE IMPAIRMENTS: Abnormal gait, decreased activity tolerance, decreased balance, decreased endurance, decreased mobility, difficulty walking, decreased ROM, decreased strength, increased edema, impaired flexibility, and pain.   ACTIVITY LIMITATIONS: carrying, lifting, bending, sitting, standing, squatting, stairs, and locomotion level  PARTICIPATION LIMITATIONS: meal prep, cleaning, laundry, driving, shopping, and community activity  PERSONAL FACTORS: Age, Fitness, Time since onset of injury/illness/exacerbation, and 3+ comorbidities: see above  are also affecting patient's  functional outcome.   REHAB POTENTIAL: Good  CLINICAL DECISION MAKING: Stable/uncomplicated  EVALUATION COMPLEXITY: Low   GOALS: Goals reviewed with patient? Yes  SHORT TERM GOALS: (target date for Short term goals are 3 weeks 02/13/2024)   1.  Patient will demonstrate independent use of home exercise program to maintain progress from in clinic treatments.  Goal status: MET 02/07/24  LONG TERM GOALS: (target dates for all long term goals are 8 weeks  03/19/2024 )   1. Patient will demonstrate/report pain at worst less than or equal to 3/10 to facilitate minimal limitation in daily activity secondary to pain symptoms.  Goal status: ongoing 4/8/   2. Patient will improve PSFS to > or equal to 6.5/10 to show improved function.  Goal status: New   3. Patient will demonstrate Patient specific functional scale avg > or = 7 to indicate reduced disability due to condition.   Goal status: New   4.  Patient will demonstrate BLE MMT 4+/5 throughout to faciltiate usual transfers, stairs, squatting at [facility] for daily life.   Goal status: New   5.  Patient will demonstrate/report ability to perform 18 inch chair transfer s UE assist.  Goal status: New   6.  Patient will demonstrate TUG </=13.5sec in effort to reduce risk of falls Goal status: New   7.  Patient will demonstrate up and down a flight of stairs with single hand rail with reciprocal gait pattern Goal Status: New   PLAN:  PT FREQUENCY: 1-2x/week  PT DURATION: 8 weeks  PLANNED INTERVENTIONS: Can include 14782- PT Re-evaluation, 97110-Therapeutic exercises, 97530- Therapeutic activity, 97112- Neuromuscular re-education, 97535- Self Care, 97140- Manual therapy, [phone]- Gait training, [phone]- Orthotic Fit/training, [phone]- Canalith repositioning, [id number]- Aquatic Therapy, [phone]- Electrical stimulation (unattended), [phone]- Electrical stimulation (manual), [id number] Physical performance testing, 97016- Vasopneumatic device, [id number]-  Ultrasound, [id number]- Traction (mechanical), [id number]- Ionotophoresis 4mg /ml Dexamethasone, Patient/Family education, Balance training, Stair training, Taping, Dry Needling, Joint mobilization, Joint manipulation, Spinal manipulation, Spinal mobilization, Scar mobilization, Vestibular training, Visual/preceptual remediation/compensation, DME instructions, Cryotherapy, and Moist heat.  All performed as medically necessary.  All included unless contraindicated  PLAN FOR NEXT SESSION: repeat DN if desired. Work on hip flexion strength and balance.    Ivery Quale, PT, DPT 02/16/24 1:48 PM

## 2024-02-20 DIAGNOSIS — S1086XA Insect bite of other specified part of neck, initial encounter: Secondary | ICD-10-CM | POA: Diagnosis not present

## 2024-02-20 DIAGNOSIS — W57XXXA Bitten or stung by nonvenomous insect and other nonvenomous arthropods, initial encounter: Secondary | ICD-10-CM | POA: Diagnosis not present

## 2024-02-20 DIAGNOSIS — L03221 Cellulitis of neck: Secondary | ICD-10-CM | POA: Diagnosis not present

## 2024-02-21 ENCOUNTER — Ambulatory Visit: Admitting: Sports Medicine

## 2024-02-21 ENCOUNTER — Encounter: Payer: Self-pay | Admitting: Sports Medicine

## 2024-02-21 ENCOUNTER — Encounter: Admitting: Physical Therapy

## 2024-02-21 DIAGNOSIS — M25551 Pain in right hip: Secondary | ICD-10-CM | POA: Diagnosis not present

## 2024-02-21 DIAGNOSIS — Z96641 Presence of right artificial hip joint: Secondary | ICD-10-CM | POA: Diagnosis not present

## 2024-02-21 DIAGNOSIS — M79609 Pain in unspecified limb: Secondary | ICD-10-CM | POA: Diagnosis not present

## 2024-02-21 DIAGNOSIS — M6281 Muscle weakness (generalized): Secondary | ICD-10-CM | POA: Diagnosis not present

## 2024-02-21 DIAGNOSIS — Z96642 Presence of left artificial hip joint: Secondary | ICD-10-CM

## 2024-02-21 DIAGNOSIS — M25552 Pain in left hip: Secondary | ICD-10-CM

## 2024-02-21 MED ORDER — LIDOCAINE HCL 1 % IJ SOLN
2.0000 mL | INTRAMUSCULAR | Status: AC | PRN
  Administered 2024-02-21: 2 mL

## 2024-02-21 MED ORDER — METHYLPREDNISOLONE ACETATE 40 MG/ML IJ SUSP
20.0000 mg | INTRAMUSCULAR | Status: AC | PRN
  Administered 2024-02-21: 20 mg via INTRAMUSCULAR

## 2024-02-21 MED ORDER — BUPIVACAINE HCL 0.25 % IJ SOLN
2.0000 mL | INTRAMUSCULAR | Status: AC | PRN
  Administered 2024-02-21: 2 mL

## 2024-02-21 NOTE — Progress Notes (Signed)
 Lauren MCFERRAN - 78 y.o. female MRN [id number]  Date of birth: 03-11-1946  Office Visit Note: Visit Date: 02/21/2024 PCP: Jimmey Mould, MD Referred by: Jimmey Mould, MD  Subjective: Chief Complaint  Patient presents with   Right Hip - Follow-up   Left Hip - Follow-up   HPI: Lauren [name] is a pleasant 78 y.o. female who presents today for bilateral anterior hip and hip flexor pain in the setting of previous THAs.  She is recently s/p L-hip THA on 12/06/2023.  She previously had a right hip THA on 11/26/2016.  She is doing well in terms of her recovery from hip replacement.  But she continues with anterior hip pain near the hip flexors.  She has been doing physical therapy which is helping but still having some weakness about the posterior hips.  She continues her hydrocodone  10-325 mg 3 times daily for her chronic pain which is managed by pain management.  She is no longer using a cane to ambulate.   As a reminder, we have done extracorporeal shockwave therapy to her scar tissue in her hip flexors/lateral hip with good temporary relief but not long-lasting.  Recently she has done a few sessions of dry needling which has given her temporary relief, but only for a few days.  Pertinent ROS were reviewed with the patient and found to be negative unless otherwise specified above in HPI.   Assessment & Plan: Visit Diagnoses:  1. Bilateral hip pain   2. Status post total replacement of right hip   3. Status post total replacement of left hip   4. Proximal limb muscle weakness   5. Trigger point of extremity    Plan: Impression is chronic bilateral hip/hip flexor pain in the setting of proximal muscle weakness.  She is status post right hip THA on 11/26/2016 and L-hip THA on 12/06/2023.  I see nothing wrong with her hip prosthesis, I believe more of her pain is coming from the resultant scar tissue in the overlying hip flexors.  Through shared decision making, we did perform  trigger point injection into the right anterior hip flexor (RF) and overlying scar tissue, patient tolerated well.  I would like to see over the next few weeks how this does, we could consider this for the left hip if helpful but would like to wait another few months given the subacute hip replacement itself.  We discussed further strengthening and stabilizing the hips/hip flexors, referral sent for aquatic-based physical therapy to help with mobility and hip stability.  She will continue her hydrocodone /acetaminophen  10-325 mg 3 times daily for her pain.  I would like to see her back after a few sessions of aquatic-based PT and land therapy to see how she is responding, follow-up about the 6-week mark.  I also discussed having her possibly see, Zigmund Hills, for soft tissue/DN treatment in the future if above is not significantly helpful - hold for now.   Follow-up: Return in about 6 weeks (around 04/03/2024) for Bilateral hips (30-min for poss procedure).   Meds & Orders: No orders of the defined types were placed in this encounter.   Orders Placed This Encounter  Procedures   Trigger Point Inj   Ambulatory referral to Physical Therapy     Procedures: Trigger Point Inj  Date/Time: 02/21/2024 10:31 AM  Performed by: Shauna Del, DO Authorized by: Shauna Del, DO   Consent Given by:  Patient Site marked: the procedure site was marked  Timeout: prior to procedure the correct patient, procedure, and site was verified   Indications:  Pain and therapeutic Total # of Trigger Points:  2 Location: hip   Needle Size:  25 G Approach:  Dorsal Medications #1:  2 mL lidocaine  1 %; 2 mL bupivacaine  0.25 %; 20 mg methylPREDNISolone  acetate 40 MG/ML Medications #2:  2 mL lidocaine  1 %; 2 mL bupivacaine  0.25 %; 20 mg methylPREDNISolone  acetate 40 MG/ML Patient tolerance:  Patient tolerated the procedure well with no immediate complications Comments: Procedure: Trigger point injections (2),  Anterior Right hip After discussion on R/B/I and informed verbal consent was obtained, a timeout was conducted. The patient was placed in a supine position on the examination table and the area of maximal tenderness was identified over the right anterior hip flexors.  This area was cleansed with Chloraprep and multiple alcohol swabs. Ethyl chloride was used for local anesthesia. Using a 25-gauge 1.5 inch needle the trigger point(s) was subsequently injected with a mixture of 1 cc of methylprednisolone  20 mg/mL, 1 cc of bupivicaine 0.25% and 1 cc of 1% lidocaine  without epinephrine , with a total of 3 cc of injectate into each trigger point (2). A band-aid was applied following. Patient tolerated procedure well, there were no post-injection complications. Post-procedure instructions were given.           Clinical History: No specialty comments available.  She reports that she quit smoking about 41 years ago. Her smoking use included cigarettes. She started smoking about 51 years ago. She has a 10 pack-year smoking history. She has never used smokeless tobacco.  Recent Labs    03/23/23 1012 06/08/23 0941  HGBA1C 6.5* 6.0*    Objective:    Physical Exam  Gen: Well-appearing, in no acute distress; non-toxic CV: Well-perfused. Warm.  Resp: Breathing unlabored on room air; no wheezing. Psych: Fluid speech in conversation; appropriate affect; normal thought process  Ortho Exam - Bilateral hips: Well-healed anterior incision from prior THA without redness swelling or effusion.  There is tenderness just medial to her scar on the right leg with reproducible pain with hip flexion overlying the rectus femoris.  There is fluid range of motion with internal and external logroll.  There is still a degree of proximal hip flexor weakness bilaterally.  Imaging:  DG Pelvis Portable CLINICAL DATA:  Postop left hip arthroplasty.  EXAM: PORTABLE PELVIS 1-2 VIEWS  COMPARISON:  None  Available.  FINDINGS: Left hip arthroplasty in expected alignment. No periprosthetic lucency or fracture. Recent postsurgical change includes air and edema in the soft tissues. Lateral skin staples in place. A right hip arthroplasty.  IMPRESSION: Left hip arthroplasty without immediate postoperative complication.  Electronically Signed   By: Chadwick Colonel M.D.   On: 12/06/2023 10:14 DG HIP UNILAT WITH PELVIS 1V LEFT CLINICAL DATA:  Elective surgery.  Left hip replacement.  EXAM: DG HIP (WITH OR WITHOUT PELVIS) 1V*L*  COMPARISON:  None Available.  FINDINGS: Three fluoroscopic spot views of the pelvis and left hip obtained in the operating room. Images during hip arthroplasty. Fluoroscopy time 13 seconds. Dose 1.96 mGy.  IMPRESSION: Intraoperative fluoroscopy during left hip arthroplasty.  Electronically Signed   By: Chadwick Colonel M.D.   On: 12/06/2023 10:14 DG C-Arm 1-60 Min-No Report Fluoroscopy was utilized by the requesting physician.  No radiographic  interpretation.   Past Medical/Family/Surgical/Social History: Medications & Allergies reviewed per EMR, new medications updated. Patient Active Problem List   Diagnosis Date Noted   Status post total replacement  of left hip 12/06/2023   Stress incontinence 11/22/2023   Restless leg syndrome 09/22/2023   Other hyperlipidemia 06/08/2023   Unilateral primary osteoarthritis, left hip 05/11/2023   Depression 03/16/2023   Allergic rhinitis 12/29/2022   Seasonal affective disorder (HCC) 12/01/2022   BMI 32.0-32.9,adult 12/01/2022   Obesity, Beginning BMI 37.59 12/01/2022   OA (osteoarthritis) of knee 08/03/2022   Status post total right knee replacement 08/03/2022   Stress 07/19/2022   Other constipation 07/19/2022   Atypical chest pain 06/25/2021   Class 2 severe obesity with serious comorbidity and body mass index (BMI) of 37.0 to 37.9 in adult (HCC) 06/03/2021   Vitamin D  deficiency 06/03/2021   Other  fatigue 04/22/2021   SOB (shortness of breath) on exertion 04/22/2021   Lower extremity edema 03/05/2021   Coronary artery calcification 02/20/2020   Pure hypercholesterolemia 02/20/2020   Unilateral primary osteoarthritis, right knee 01/03/2019   Genetic testing 12/07/2018   Family history of breast cancer    Family history of prostate cancer    Family history of colon cancer    Family history of kidney cancer    Malignant neoplasm of upper-outer quadrant of right breast in female, 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/09/2018   Diabetic neuropathy, type II diabetes mellitus (HCC) 11/09/2018   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/02/2017   Trochanteric bursitis, right hip 05/10/2017   Status post total replacement of right hip 11/26/2016   DDD (degenerative disc disease), 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   Low back pain 04/23/2015   Hiatal hernia 04/13/2013   Diabetes mellitus (HCC) 01/26/2013   Umbilical hernia 07/24/2012   Essential hypertension    Past Medical History:  Diagnosis Date   Anxiety    Arthritis    Back, Hip- right   Atypical chest pain 06/25/2021   Back pain    Cancer (HCC)    right breast   Cataract    Colitis    Coronary artery calcification 02/20/2020   Diabetes mellitus without complication (HCC)    Type II   Edema 03/05/2021   Edema of both lower extremities    Endometriosis    Family history of adverse reaction to anesthesia    Father - N/V - blood pressure; daughter N/V   Family history of breast cancer    Family history of colon cancer    Family history of kidney cancer    Family history of prostate cancer    Fatty liver    GERD (gastroesophageal reflux disease)    History of hiatal hernia    History of kidney stones    History of ulcerative colitis    Hypertension    Insomnia    Joint pain    Kidney problem    Lower extremity edema 03/05/2021   Lumbar disc disease    L4 L5   Neuropathy    Osteoarthritis    Other hyperlipidemia     Palpitations    Personal history of radiation therapy    Pneumonia    1983, 2003   Pure hypercholesterolemia 02/20/2020   PVCs (premature ventricular contractions)    benign   Umbilical hernia    Vitamin D  deficiency    Wears glasses    Family History  Problem Relation Age of Onset   Hyperlipidemia Mother    Diabetes Mother    Hypertension Mother    Parkinson's disease Mother    Kidney disease Mother    Kidney disease Father    Stroke Father    Heart failure Father  Hypertension Father    Heart disease Father    Diabetes Father    Cancer Father        colon, prostate, and kidney   Heart disease Maternal Grandmother    Diabetes Maternal Grandmother    Heart disease Maternal Grandfather    Diabetes Maternal Grandfather    Cancer Maternal Grandfather        pt unaware of what kind   Stroke Paternal Grandmother    Stroke Paternal Grandfather    Cancer Maternal Aunt        cervical vs ovarian   Colon cancer Paternal Aunt        dx less than 50   Breast cancer Paternal Aunt        dx in her 69s   Prostate cancer Paternal Uncle    Breast cancer Cousin        pat first cousin, dx over 57   Prostate cancer Other        PGF's father   Past Surgical History:  Procedure Laterality Date   APPENDECTOMY     BREAST BIOPSY  08-13-2009  DR TSUEI   EXCISION LEFT NIPPLE DUCT   BREAST EXCISIONAL BIOPSY Left    x2   BREAST EXCISIONAL BIOPSY Right    BREAST LUMPECTOMY Right 11/16/2018   invasive ductal   CHOLECYSTECTOMY  1992   COLON RESECTION  1986   ULCERATIVE COLITIS   COLON SURGERY     ESOPHAGEAL MANOMETRY N/A 06/11/2013   Procedure: ESOPHAGEAL MANOMETRY (EM);  Surgeon: Mathew Solomon, MD;  Location: [facility];  Service: Endoscopy;  Laterality: N/A;   ESOPHAGOGASTRODUODENOSCOPY (EGD) WITH PROPOFOL  N/A 05/31/2016   Procedure: ESOPHAGOGASTRODUODENOSCOPY (EGD) WITH PROPOFOL ;  Surgeon: Ozell Blunt, MD;  Location[facility] ENDOSCOPY;  Service: Endoscopy;  Laterality: N/A;    EXCISION RIGHT BREAST MASS   10-20-2005  DR Donata Fryer YOUNG   EYE SURGERY Bilateral 2023   cataract   FRACTURE SURGERY     left arm   kidney stone removed  12/2009   KNEE ARTHROSCOPY Left    MCL   LEFT THUMB CARPOMETACARPAL JOINT SUSPENSIONPLASTY  04-06-2006  DR Donzella Galley   LEFT WRIST ARTHROSCOPY W/ DEBRIDEMENT AND REMOVAL CYST  03-26-2004  DR Donzella Galley   MYOMECTOMY     RADIOACTIVE SEED GUIDED PARTIAL MASTECTOMY WITH AXILLARY SENTINEL LYMPH NODE BIOPSY Right 11/16/2018   Procedure: RIGHT BREAST RADIOACTIVE SEED GUIDED PARTIAL MASTECTOMY WITH  SENTINEL  NODE BIOPSY;  Surgeon: Oza Blumenthal, MD;  Location: [facility] OR;  Service: General;  Laterality: Right;   RIGHT COLECTOMY     RIGHT URETEROSCOPIC STONE EXTRACTION  12-11-2009  DR Autry Legions [facility]   TENDON RELEASE     TONSILLECTOMY     TOTAL HIP ARTHROPLASTY Right 11/26/2016   Procedure: RIGHT TOTAL HIP ARTHROPLASTY ANTERIOR APPROACH;  Surgeon: Arnie Lao, MD;  Location: [facility] ORS;  Service: Orthopedics;  Laterality: Right;   TOTAL HIP ARTHROPLASTY Left 12/06/2023   Procedure: LEFT TOTAL HIP ARTHROPLASTY ANTERIOR APPROACH;  Surgeon: Arnie Lao, MD;  Location: [facility] OR;  Service: Orthopedics;  Laterality: Left;   TOTAL KNEE ARTHROPLASTY Left 12/02/2017   Procedure: LEFT TOTAL KNEE ARTHROPLASTY;  Surgeon: Arnie Lao, MD;  Location: [facility];  Service: Orthopedics;  Laterality: Left;   TOTAL KNEE ARTHROPLASTY Right 08/03/2022   Procedure: RIGHT TOTAL KNEE ARTHROPLASTY;  Surgeon: Arnie Lao, MD;  Location: [facility] OR;  Service: Orthopedics;  Laterality: Right;   WRIST SURGERY     both   Social History  Occupational History   Occupation: Retired  Tobacco Use   Smoking status: Former    Current packs/day: 0.00    Average packs/day: 1 pack/day for 10.0 years (10.0 ttl pk-yrs)    Types: Cigarettes    Start date: 09/22/1972    Quit date: 09/22/1982    Years since quitting: 41.4   Smokeless tobacco: Never   Tobacco  comments:    quit 1983  Vaping Use   Vaping status: Never Used  Substance and Sexual Activity   Alcohol use: No   Drug use: No   Sexual activity: Yes    Partners: Male    Birth control/protection: Post-menopausal

## 2024-02-21 NOTE — Progress Notes (Signed)
 Patient says that her physical therapy has been going well and she is doing her exercises at home. She is having some soreness that seems to be right by her scars. She has had some dry needling done which has given her temporary relief, but never longer than a couple of days.

## 2024-02-22 ENCOUNTER — Ambulatory Visit: Payer: PPO | Admitting: Obstetrics and Gynecology

## 2024-02-22 ENCOUNTER — Encounter: Payer: Self-pay | Admitting: Obstetrics and Gynecology

## 2024-02-22 VITALS — BP 110/67 | HR 79 | Ht 62.75 in | Wt 187.0 lb

## 2024-02-22 DIAGNOSIS — N393 Stress incontinence (female) (male): Secondary | ICD-10-CM | POA: Diagnosis not present

## 2024-02-22 DIAGNOSIS — R350 Frequency of micturition: Secondary | ICD-10-CM | POA: Diagnosis not present

## 2024-02-22 DIAGNOSIS — N3281 Overactive bladder: Secondary | ICD-10-CM | POA: Diagnosis not present

## 2024-02-22 LAB — POCT URINALYSIS DIPSTICK
Bilirubin, UA: NEGATIVE
Blood, UA: NEGATIVE
Glucose, UA: NEGATIVE — AB
Ketones, UA: POSITIVE
Leukocytes, UA: NEGATIVE
Nitrite, UA: NEGATIVE
Protein, UA: POSITIVE — AB
Spec Grav, UA: 1.015 (ref 1.010–1.025)
Urobilinogen, UA: 0.2 U/dL
pH, UA: 7 (ref 5.0–8.0)

## 2024-02-22 MED ORDER — MIRABEGRON ER 25 MG PO TB24: 25.0000 mg | ORAL_TABLET | Freq: Every day | ORAL | 5 refills | Status: DC

## 2024-02-22 NOTE — Assessment & Plan Note (Signed)
-   We discussed the symptoms of overactive bladder (OAB), which include urinary urgency, urinary frequency, nocturia, with or without urge incontinence.  While we do not know the exact etiology of OAB, several treatment options exist. We discussed management including behavioral therapy (decreasing bladder irritants, urge suppression strategies, timed voids, bladder retraining), physical therapy, medication; for refractory cases posterior tibial nerve stimulation, sacral neuromodulation, and intravesical botulinum toxin injection.  - We discussed increasing water  intake and decreasing bladder irritants (coffee, tea, soda).  - She is interested in pelvic PT, referral placed.  - Prescribed Myrbetriq  25mg  daily. For Beta-3 agonist medication, we discussed the potential side effect of elevated blood pressure which is more likely to occur in individuals with uncontrolled hypertension.

## 2024-02-22 NOTE — Assessment & Plan Note (Signed)
-   For treatment of stress urinary incontinence,  non-surgical options include expectant management, weight loss, physical therapy, as well as a pessary.  Surgical options include a midurethral sling, Burch urethropexy, and transurethral injection of a bulking agent. - She demonstrated SUI on exam today. She may be interested in a sling but wants to start with pelvic PT first.

## 2024-02-22 NOTE — Patient Instructions (Signed)

## 2024-02-22 NOTE — Progress Notes (Signed)
 New Patient Evaluation and Consultation  Referring Provider: Glenora Laos, MD PCP: Jimmey Mould, MD Date of Service: 02/22/2024  SUBJECTIVE Chief Complaint: New Patient (Initial Visit) Lauren [name] is a 78 y.o. female here for a consult for mixed incontinence)  History of Present Illness: Lauren [name] is a 78 y.o. White or Caucasian female seen in consultation at the request of Dr Alvia Awkward for evaluation of incontinence.     Urinary Symptoms: Leaks urine with cough/ sneeze, laughing, exercise, lifting, going from sitting to standing, and with a full bladder Sometimes leaks a few times, others a lot. Usually more urgency but also has leakage with tasks like bending.  Pad use: 3-4 liners/ mini-pads per day.   Patient is bothered by UI symptoms.  Has not had prior treatment for her leakage.   Day time voids- every 2-3 hours, but always has urgency.  Nocturia: 1-2 times per night to void. Voiding dysfunction:  does not empty bladder well.  Patient does not use a catheter to empty bladder.  When urinating, patient feels the need to urinate multiple times in a row Drinks: 1 cup coffee in AM, glass of water  with breakfast, 1 20oz caffeine free diet coke, 1 glass water  or unsweet tea with dinner per day, has water  around 9-10pm with pills Has lost about 30 lbs on [place].   UTIs:  1  UTI's in the last year.   Denies history of blood in urine and kidney or bladder stones  Pelvic Organ Prolapse Symptoms:                  Patient Denies a feeling of a bulge the vaginal area.   Bowel Symptom: Bowel movements: 1-2 time(s) per day Stool consistency: soft  Straining: no.  Splinting: no.  Incomplete evacuation: no.  Patient Denies accidental bowel leakage / fecal incontinence Bowel regimen: fiber  Sexual Function Sexually active: no.  Sexual orientation:  heterosexual Pain with sex: Yes, at the vaginal opening  Pelvic Pain Denies pelvic pain   Past Medical  History:  Past Medical History:  Diagnosis Date   Anxiety    Arthritis    Back, Hip- right   Atypical chest pain 06/25/2021   Back pain    Cancer (HCC)    right breast   Cataract    Colitis    Coronary artery calcification 02/20/2020   Diabetes mellitus without complication (HCC)    Type II   Edema 03/05/2021   Edema of both lower extremities    Endometriosis    Family history of adverse reaction to anesthesia    Father - N/V - blood pressure; daughter N/V   Family history of breast cancer    Family history of colon cancer    Family history of kidney cancer    Family history of prostate cancer    Fatty liver    GERD (gastroesophageal reflux disease)    History of hiatal hernia    History of kidney stones    History of ulcerative colitis    Hypertension    Insomnia    Joint pain    Kidney problem    Lower extremity edema 03/05/2021   Lumbar disc disease    L4 L5   Neuropathy    Osteoarthritis    Other hyperlipidemia    Palpitations    Personal history of radiation therapy    Pneumonia    1983, 2003   Pure hypercholesterolemia 02/20/2020   PVCs (premature ventricular contractions)  benign   Umbilical hernia    Vitamin D  deficiency    Wears glasses      Past Surgical History:   Past Surgical History:  Procedure Laterality Date   APPENDECTOMY     BREAST BIOPSY  08-13-2009  DR TSUEI   EXCISION LEFT NIPPLE DUCT   BREAST EXCISIONAL BIOPSY Left    x2   BREAST EXCISIONAL BIOPSY Right    BREAST LUMPECTOMY Right 11/16/2018   invasive ductal   CHOLECYSTECTOMY  1992   COLON RESECTION  1986   ULCERATIVE COLITIS   COLON SURGERY     ESOPHAGEAL MANOMETRY N/A 06/11/2013   Procedure: ESOPHAGEAL MANOMETRY (EM);  Surgeon: Mathew Solomon, MD;  Location: [facility];  Service: Endoscopy;  Laterality: N/A;   ESOPHAGOGASTRODUODENOSCOPY (EGD) WITH PROPOFOL  N/A 05/31/2016   Procedure: ESOPHAGOGASTRODUODENOSCOPY (EGD) WITH PROPOFOL ;  Surgeon: Ozell Blunt, MD;  Location: [facility]  ENDOSCOPY;  Service: Endoscopy;  Laterality: N/A;   EXCISION RIGHT BREAST MASS   10-20-2005  DR Donata Fryer YOUNG   EYE SURGERY Bilateral 2023   cataract   FRACTURE SURGERY     left arm   kidney stone removed  12/2009   KNEE ARTHROSCOPY Left    MCL   LEFT THUMB CARPOMETACARPAL JOINT SUSPENSIONPLASTY  04-06-2006  DR Donzella Galley   LEFT WRIST ARTHROSCOPY W/ DEBRIDEMENT AND REMOVAL CYST  03-26-2004  DR Donzella Galley   MYOMECTOMY     RADIOACTIVE SEED GUIDED PARTIAL MASTECTOMY WITH AXILLARY SENTINEL LYMPH NODE BIOPSY Right 11/16/2018   Procedure: RIGHT BREAST RADIOACTIVE SEED GUIDED PARTIAL MASTECTOMY WITH  SENTINEL  NODE BIOPSY;  Surgeon: Oza Blumenthal, MD;  Location: [facility] OR;  Service: General;  Laterality: Right;   RIGHT COLECTOMY     RIGHT URETEROSCOPIC STONE EXTRACTION  12-11-2009  DR Autry [facility]   TENDON RELEASE     TONSILLECTOMY     TOTAL HIP ARTHROPLASTY Right 11/26/2016   Procedure: RIGHT TOTAL HIP ARTHROPLASTY ANTERIOR APPROACH;  Surgeon: Arnie Lao, MD;  Location: [facility];  Service: Orthopedics;  Laterality: Right;   TOTAL HIP ARTHROPLASTY Left 12/06/2023   Procedure: LEFT TOTAL HIP ARTHROPLASTY ANTERIOR APPROACH;  Surgeon: Arnie Lao, MD;  Location: [facility] OR;  Service: Orthopedics;  Laterality: Left;   TOTAL KNEE ARTHROPLASTY Left 12/02/2017   Procedure: LEFT TOTAL KNEE ARTHROPLASTY;  Surgeon: Arnie Lao, MD;  Location: [facility];  Service: Orthopedics;  Laterality: Left;   TOTAL KNEE ARTHROPLASTY Right 08/03/2022   Procedure: RIGHT TOTAL KNEE ARTHROPLASTY;  Surgeon: Arnie Lao, MD;  Location: [facility];  Service: Orthopedics;  Laterality: Right;   WRIST SURGERY     both     Past OB/GYN History: OB History  Gravida Para Term Preterm AB Living  2 2 2   2   SAB IAB Ectopic Multiple Live Births      2    # Outcome Date GA Lbr Len/2nd Weight Sex Type Anes PTL Lv  2 Term      Vag-Spont     1 Term      Vag-Spont      Denies PMB Any history of abnormal  pap smears: no.   Medications: Patient has a current medication list which includes the following prescription(s): aspirin  ec, b-complex with vitamin c, chlorthalidone , escitalopram , fiber, fluticasone , gabapentin , hydrocodone -acetaminophen , onetouch delica plus lancet33g, lidocaine , magnesium , methocarbamol , mirabegron  er, onetouch verio, pantoprazole , phenylephrine , rosuvastatin , tirzepatide , and UNABLE TO FIND.   Allergies: Patient is allergic to ciprofloxacin hcl, percocet [oxycodone -acetaminophen ], penicillins, sulfa antibiotics, tape, tetanus toxoids, and jardiance [empagliflozin].  Social History:  Social History   Tobacco Use   Smoking status: Former    Current packs/day: 0.00    Average packs/day: 1 pack/day for 10.0 years (10.0 ttl pk-yrs)    Types: Cigarettes    Start date: 09/22/1972    Quit date: 09/22/1982    Years since quitting: 41.4   Smokeless tobacco: Never   Tobacco comments:    quit 1983  Vaping Use   Vaping status: Never Used  Substance Use Topics   Alcohol use: No   Drug use: No    Relationship status: widowed Patient lives alone (with dog).   Patient is not employed. Regular exercise: Yes: PT after hip replacement History of abuse: No  Family History:   Family History  Problem Relation Age of Onset   Hyperlipidemia Mother    Diabetes Mother    Hypertension Mother    Parkinson's disease Mother    Kidney disease Mother    Kidney disease Father    Stroke Father    Heart failure Father    Hypertension Father    Heart disease Father    Diabetes Father    Cancer Father        colon, prostate, and kidney   Heart disease Maternal Grandmother    Diabetes Maternal Grandmother    Heart disease Maternal Grandfather    Diabetes Maternal Grandfather    Cancer Maternal Grandfather        pt unaware of what kind   Stroke Paternal Grandmother    Stroke Paternal Grandfather    Cancer Maternal Aunt        cervical vs ovarian   Colon cancer Paternal  Aunt        dx less than 50   Breast cancer Paternal Aunt        dx in her 44s   Prostate cancer Paternal Uncle    Breast cancer Cousin        pat first cousin, dx over 9   Prostate cancer Other        PGF's father     Review of Systems: ROS   OBJECTIVE Physical Exam: Vitals:   02/22/24 1038  BP: 110/67  Pulse: 79  Weight: 187 lb (84.8 kg)  Height: 5' 2.75" (1.594 m)    Physical Exam   GU / Detailed Urogynecologic Evaluation:  Pelvic Exam: Normal external female genitalia; Bartholin's and Skene's glands normal in appearance; urethral meatus normal in appearance, no urethral masses or discharge.   CST: positive  Speculum exam reveals normal vaginal mucosa with atrophy. Cervix normal appearance. Uterus normal single, nontender. Adnexa no mass, fullness, tenderness.     Pelvic floor strength I/V  Pelvic floor musculature: Right levator non-tender, Right obturator non-tender, Left levator non-tender, Left obturator non-tender  POP-Q:   POP-Q  -3                                            Aa   -3                                           Ba  -8.5  C   2                                            Gh  3.5                                            Pb  9                                            tvl   -3                                            Ap  -3                                            Bp  -9                                              D      Rectal Exam:  Normal external   Post-Void Residual (PVR) by Bladder Scan: In order to evaluate bladder emptying, we discussed obtaining a postvoid residual and patient agreed to this procedure.  Procedure: The ultrasound unit was placed on the patient's abdomen in the suprapubic region after the patient had voided.    Post Void Residual - 02/22/24 1049       Post Void Residual   Post Void Residual 0 mL              Laboratory Results: Lab  Results  Component Value Date   COLORU Dark Yellow 02/22/2024   CLARITYU Clear 02/22/2024   GLUCOSEUR Negative (A) 02/22/2024   BILIRUBINUR Negative 02/22/2024   KETONESU Positive 02/22/2024   SPECGRAV 1.015 02/22/2024   RBCUR Negative 02/22/2024   PHUR 7.0 02/22/2024   PROTEINUR Positive (A) 02/22/2024   UROBILINOGEN 0.2 02/22/2024   LEUKOCYTESUR Negative 02/22/2024    Lab Results  Component Value Date   CREATININE 0.97 12/07/2023   CREATININE 0.90 11/30/2023   CREATININE 0.91 06/08/2023    Lab Results  Component Value Date   HGBA1C 6.0 (H) 06/08/2023    Lab Results  Component Value Date   HGB 12.4 11/30/2023     ASSESSMENT AND PLAN Ms. Grisanti is a 78 y.o. with:  1. Overactive bladder   2. Urinary frequency   3. SUI (stress urinary incontinence, female)     Overactive bladder Assessment & Plan: - We discussed the symptoms of overactive bladder (OAB), which include urinary urgency, urinary frequency, nocturia, with or without urge incontinence.  While we do not know the exact etiology of OAB, several treatment options exist. We discussed management including behavioral therapy (decreasing bladder irritants, urge suppression strategies, timed voids, bladder retraining), physical therapy, medication; for refractory cases posterior tibial nerve stimulation, sacral neuromodulation, and  intravesical botulinum toxin injection.  - We discussed increasing water  intake and decreasing bladder irritants (coffee, tea, soda).  - She is interested in pelvic PT, referral placed.  - Prescribed Myrbetriq  25mg  daily. For Beta-3 agonist medication, we discussed the potential side effect of elevated blood pressure which is more likely to occur in individuals with uncontrolled hypertension.   Orders: -     Mirabegron  ER; Take 1 tablet (25 mg total) by mouth daily.  Dispense: 30 tablet; Refill: 5 -     AMB referral to rehabilitation  Urinary frequency -     POCT urinalysis  dipstick  SUI (stress urinary incontinence, female) Assessment & Plan: - For treatment of stress urinary incontinence,  non-surgical options include expectant management, weight loss, physical therapy, as well as a pessary.  Surgical options include a midurethral sling, Burch urethropexy, and transurethral injection of a bulking agent. - She demonstrated SUI on exam today. She may be interested in a sling but wants to start with pelvic PT first.   Orders: -     AMB referral to rehabilitation  Return 6 weeks for follow up   Arma Lamp, MD

## 2024-02-23 ENCOUNTER — Encounter: Payer: Self-pay | Admitting: Physical Therapy

## 2024-02-23 ENCOUNTER — Ambulatory Visit: Admitting: Physical Therapy

## 2024-02-23 DIAGNOSIS — R262 Difficulty in walking, not elsewhere classified: Secondary | ICD-10-CM

## 2024-02-23 DIAGNOSIS — M25551 Pain in right hip: Secondary | ICD-10-CM | POA: Diagnosis not present

## 2024-02-23 DIAGNOSIS — M25552 Pain in left hip: Secondary | ICD-10-CM

## 2024-02-23 DIAGNOSIS — M6281 Muscle weakness (generalized): Secondary | ICD-10-CM

## 2024-02-23 NOTE — Therapy (Signed)
 OUTPATIENT PHYSICAL THERAPY TREATMENT   Patient Name: Lauren [name] MRN: [id number] DOB:[birth year], 78 y.o., female Today's Date: 02/23/2024  END OF SESSION:  PT End of Session - 02/23/24 0929     Visit Number 7    Number of Visits 16    Date for PT Re-Evaluation 03/19/24    Authorization Type [employer] $10 copay    Progress Note Due on Visit 10    PT Start Time 0930    PT Stop Time 1008    PT Time Calculation (min) 38 min    Activity Tolerance Patient tolerated treatment well    Behavior During Therapy [facility] for tasks assessed/performed                 Past Medical History:  Diagnosis Date   Anxiety    Arthritis    Back, Hip- right   Atypical chest pain 06/25/2021   Back pain    Cancer (HCC)    right breast   Cataract    Colitis    Coronary artery calcification 02/20/2020   Diabetes mellitus without complication (HCC)    Type II   Edema 03/05/2021   Edema of both lower extremities    Endometriosis    Family history of adverse reaction to anesthesia    Father - N/V - blood pressure; daughter N/V   Family history of breast cancer    Family history of colon cancer    Family history of kidney cancer    Family history of prostate cancer    Fatty liver    GERD (gastroesophageal reflux disease)    History of hiatal hernia    History of kidney stones    History of ulcerative colitis    Hypertension    Insomnia    Joint pain    Kidney problem    Lower extremity edema 03/05/2021   Lumbar disc disease    L4 L5   Neuropathy    Osteoarthritis    Other hyperlipidemia    Palpitations    Personal history of radiation therapy    Pneumonia    1983, 2003   Pure hypercholesterolemia 02/20/2020   PVCs (premature ventricular contractions)    benign   Umbilical hernia    Vitamin D  deficiency    Wears glasses    Past Surgical History:  Procedure Laterality Date   APPENDECTOMY     BREAST BIOPSY  08-13-2009  DR TSUEI   EXCISION LEFT NIPPLE DUCT    BREAST EXCISIONAL BIOPSY Left    x2   BREAST EXCISIONAL BIOPSY Right    BREAST LUMPECTOMY Right 11/16/2018   invasive ductal   CHOLECYSTECTOMY  1992   COLON RESECTION  1986   ULCERATIVE COLITIS   COLON SURGERY     ESOPHAGEAL MANOMETRY N/A 06/11/2013   Procedure: ESOPHAGEAL MANOMETRY (EM);  Surgeon: Mathew Solomon, MD;  Location: [facility];  Service: Endoscopy;  Laterality: N/A;   ESOPHAGOGASTRODUODENOSCOPY (EGD) WITH PROPOFOL  N/A 05/31/2016   Procedure: ESOPHAGOGASTRODUODENOSCOPY (EGD) WITH PROPOFOL ;  Surgeon: Ozell Blunt, MD;  Location: [facility] ENDOSCOPY;  Service: Endoscopy;  Laterality: N/A;   EXCISION RIGHT BREAST MASS   10-20-2005  DR Donata Fryer YOUNG   EYE SURGERY Bilateral 2023   cataract   FRACTURE SURGERY     left arm   kidney stone removed  12/2009   KNEE ARTHROSCOPY Left    MCL   LEFT THUMB CARPOMETACARPAL JOINT SUSPENSIONPLASTY  04-06-2006  DR Donzella Galley   LEFT WRIST ARTHROSCOPY W/ DEBRIDEMENT AND REMOVAL CYST  03-26-2004  DR Donzella Galley   MYOMECTOMY     RADIOACTIVE SEED GUIDED PARTIAL MASTECTOMY WITH AXILLARY SENTINEL LYMPH NODE BIOPSY Right 11/16/2018   Procedure: RIGHT BREAST RADIOACTIVE SEED GUIDED PARTIAL MASTECTOMY WITH  SENTINEL  NODE BIOPSY;  Surgeon: Oza Blumenthal, MD;  Location: [facility] OR;  Service: General;  Laterality: Right;   RIGHT COLECTOMY     RIGHT URETEROSCOPIC STONE EXTRACTION  12-11-2009  DR Autry [facility]   TENDON RELEASE     TONSILLECTOMY     TOTAL HIP ARTHROPLASTY Right 11/26/2016   Procedure: RIGHT TOTAL HIP ARTHROPLASTY ANTERIOR APPROACH;  Surgeon: Arnie Lao, MD;  Location: [facility] ORS;  Service: Orthopedics;  Laterality: Right;   TOTAL HIP ARTHROPLASTY Left 12/06/2023   Procedure: LEFT TOTAL HIP ARTHROPLASTY ANTERIOR APPROACH;  Surgeon: Arnie Lao, MD;  Location: [facility] OR;  Service: Orthopedics;  Laterality: Left;   TOTAL KNEE ARTHROPLASTY Left 12/02/2017   Procedure: LEFT TOTAL KNEE ARTHROPLASTY;  Surgeon: Arnie Lao, MD;  Location:  [facility] ORS;  Service: Orthopedics;  Laterality: Left;   TOTAL KNEE ARTHROPLASTY Right 08/03/2022   Procedure: RIGHT TOTAL KNEE ARTHROPLASTY;  Surgeon: Arnie Lao, MD;  Location: [facility] OR;  Service: Orthopedics;  Laterality: Right;   WRIST SURGERY     both   Patient Active Problem List   Diagnosis Date Noted   Overactive bladder 02/22/2024   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/2023   SUI (stress urinary incontinence, female) 11/22/2023   Restless leg syndrome 09/22/2023   Other hyperlipidemia 06/08/2023   Unilateral primary osteoarthritis, left hip 05/11/2023   Depression 03/16/2023   Allergic rhinitis 12/29/2022   Seasonal affective disorder (HCC) 12/01/2022   BMI 32.0-32.9,adult 12/01/2022   Obesity, Beginning BMI 37.59 12/01/2022   OA (osteoarthritis) of knee 08/03/2022   Status post total right knee replacement 08/03/2022   Stress 07/19/2022   Other constipation 07/19/2022   Atypical chest pain 06/25/2021   Class 2 severe obesity with serious comorbidity and body mass index (BMI) of 37.0 to 37.9 in adult (HCC) 06/03/2021   Vitamin D  deficiency 06/03/2021   Other fatigue 04/22/2021   SOB (shortness of breath) on exertion 04/22/2021   Lower extremity edema 03/05/2021   Coronary artery calcification 02/20/2020   Pure hypercholesterolemia 02/20/2020   Unilateral primary osteoarthritis, right knee 01/03/2019   Genetic testing 12/07/2018   Family history of breast cancer    Family history of prostate cancer    Family history of colon cancer    Family history of kidney cancer    Malignant neoplasm of upper-outer quadrant of right breast in female, 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/09/2018   Diabetic neuropathy, type II diabetes mellitus (HCC) 11/09/2018   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/02/2017   Trochanteric bursitis, right hip 05/10/2017   Status post total replacement of right hip 11/26/2016   DDD (degenerative disc disease), lumbosacral  04/23/2015   Low back pain 04/23/2015   Hiatal hernia 04/13/2013   Diabetes mellitus (HCC) 01/26/2013   Umbilical hernia 07/24/2012   Essential hypertension     PCP: Jimmey Mould, MD   REFERRING PROVIDER: Shauna Del, DO   REFERRING DIAG:  [phone] (ICD-10-CM) - Proximal limb muscle weakness  M25.551,M25.552 (ICD-10-CM) - Bilateral hip pain  Z96.642 (ICD-10-CM) - Status post total replacement of left hip  Z96.641 (ICD-10-CM) - Status post total replacement of right hip    THERAPY DIAG:  Pain in left hip  Pain in right hip  Muscle weakness (generalized)  Difficulty  in walking, not elsewhere classified  Rationale for Evaluation and Treatment: Rehabilitation  ONSET DATE: Chronic pain , Patient 7wks s/p L THA 12/06/2023  SUBJECTIVE:   SUBJECTIVE STATEMENT: Slipped on some wet ground this weekend and fell; denies injury. Pain is still present; but feels she can get in/out of car easier   PERTINENT HISTORY: R THA, L TKA, anxiety, DM  PAIN:  NPRS scale: 3/10 today Pain location: crease of hip/thigh, R>L Pain description: sharp, achy, weakness Aggravating factors: being on feet for a while Relieving factors: ice, rest, pain meds  PRECAUTIONS: Anterior hip  WEIGHT BEARING RESTRICTIONS: No  FALLS:  Has patient fallen in last 6 months? No  LIVING ENVIRONMENT: Lives with: lives alone, 4 mo old basset hound Lives in: House/apartment Stairs: Yes: Internal: 1 steps; going from den to [employer] Has following equipment at home: Single point cane  OCCUPATION: retired- Chiropodist  PLOF: Independent  PATIENT GOALS: to be able to be in less pain/tolerate things better  Next MD visit: 02/21/2024 Dr. Vaughn Georges   OBJECTIVE:   DIAGNOSTIC FINDINGS:  Left hip arthroplasty in expected alignment. No periprosthetic lucency or fracture. Recent postsurgical change includes air and edema in the soft tissues. Lateral skin staples in place. A right hip arthroplasty.    IMPRESSION: Left hip arthroplasty without immediate postoperative complication.    PATIENT SURVEYS:  Patient-Specific Activity Scoring Scheme  "0" represents "unable to perform." "10" represents "able to perform at prior level. 0 1 2 3 4 5 6 7 8 9  10 (Date and Score)   Activity Eval     1. Stairs 5    2. Getting in/out cars 4    3. Getting in/out bed 4   4.    5.    Score 4.33/10    Total score = sum of the activity scores/number of activities Minimum detectable change (90%CI) for average score = 2 points Minimum detectable change (90%CI) for single activity score = 3 points  COGNITION: Overall cognitive status: WFL    SENSATION: WFL  EDEMA:  Not present  MUSCLE LENGTH: Not tested  POSTURE:  No Significant postural limitations  PALPATION: Tender to Lt hip flexor tendon and anteriomedial quadricep, Rt hip flexor tendon and medial quadricep  LOWER EXTREMITY ROM:   ROM Right eval Left eval  Hip flexion 100 80  Hip extension    Hip abduction    Hip adduction    Hip internal rotation    Hip external rotation    Knee flexion    Knee extension    Ankle dorsiflexion    Ankle plantarflexion    Ankle inversion    Ankle eversion     (Blank rows = not tested)  LOWER EXTREMITY MMT:  MMT Right eval Left eval Right 02/14/24 Left 02/14/24  Hip flexion 3+ 3- 4 4-  Hip extension      Hip abduction      Hip adduction      Hip internal rotation      Hip external rotation      Knee flexion 4 4-; painful 4+ 4+  Knee extension 3+; painful 3+; painful 4+ 4+  Ankle dorsiflexion 5 5    Ankle plantarflexion      Ankle inversion      Ankle eversion       (Blank rows = not tested)  LOWER EXTREMITY SPECIAL TESTS:  Hip: none Back: (+) SLR bilateral   FUNCTIONAL TESTS:  01/23/24 18 inch chair transfer: is not able to  rise to standing without use of UE on armrest and rocking for momentum TUG: 16.78 with use of UE on armrest GAIT: Distance walked: clinical  distance Assistive device utilized: None Level of assistance: SBA Comments: antalgic. Decreased Lt LE stance time, narrow step width, decreased step length, decreased speed                                                                                                                                                                        TODAY'S TREATMENT 02/23/24 TherEx NuStep L6 x 8 min; LEs only  TherAct Standing marching x 20 reps bil L3 band at feet Standing abduction x 20 reps L3 band bil Standing extension x 20 reps L3 band bil Lateral step ups with hip abduction hold on 6" step x 20 bil  Neuro Re-Ed Tandem walk in bars without UE support to improve balance for walking in straight line 5 round trips in bars Tandem stand 3x30 sec bil   02/16/24 TherAct Standing marching x10 reps blat L3 band Standing abduction 2x10 reps with L3 band bil; UE support needed Standing hip extension green band 2X10 bilat Seated marching over cone to simulate picking leg up to get in and out of car, 3 sets of 5 over and back Tandem walk in bars without UE support to improve balance for walking in straight line 4 round trips in bars Stepping over yoga block forward and back, X 10 bilat in bars without UE support Therex Nu step L6 X 8 min  02/14/24 TherAct Standing marching x10 reps blat L3 band Standing abduction 2x10 reps with L3 band bil; UE support needed Standing hip extension green band 2X10 bilat Seated marching over cone to simulate picking leg up to get in and out of car, 3 sets of 5 over and back Tandem walk in bars without UE support to improve balance for walking in straight line 4 round trips in bars Stepping over yoga block forward and back, X 10 bilat in bars without UE support Therex Nu step L6 X 8 min   Manual STM and IASTM to Rt anterior hip along incision; skilled palpation and monitoring of soft tissue during DN Trigger Point Dry Needling Subsequent Treatment:  Instructions provided previously at initial dry needling treatment.  Patient Verbal Consent Given: Yes Education Handout Provided: Previously Provided Muscles Treated: Proximal anterior and lateral quads Rt Electrical Stimulation Performed: No Treatment Response/Outcome:  good overall tolerance,twitch response noted     PATIENT EDUCATION:  Education details: HEP, POC Person educated: Patient Education method: Programmer, multimedia, Facilities manager, Verbal cues, and Handouts Education comprehension: verbalized understanding, returned demonstration, and verbal cues required  HOME EXERCISE PROGRAM: Access Code: [email or web address] URL: [email or web address] Date: 01/25/2024 Prepared by: Casimer Clear  Exercises -  Supine Hip Abduction  - 2 x daily - 6 x weekly - 1 sets - 10 reps - alternating marching  - 2 x daily - 6 x weekly - 1 sets - 10 reps - Standing Hip Abduction with Counter Support  - 2 x daily - 6 x weekly - 1 sets - 10 reps - Standing Lumbar Extension at Wall - Forearms  - 2 x daily - 6 x weekly - 1 sets - 10 reps - Mini Squat with Counter Support  - 2 x daily - 6 x weekly - 1 sets - 10 reps - Supine March with Resistance Band  - 2 x daily - 6 x weekly - 1 sets - 10 reps  -added seated march over object, standing march over object, and tandem walking 02/14/24 to help with getting in/out of car as well as balance for walking in straight line reducing staggering to right or left during ambulation. She denies needing picture of these.  ASSESSMENT:  CLINICAL IMPRESSION: Pt tolerated session well today with continued focus on balance and strengthening.  She is [facility] aquatics and pelvic floor rehab as well as continuing with her current POC after her scheduled visits next week.  Continue skilled PT at this time.    OBJECTIVE IMPAIRMENTS: Abnormal gait, decreased activity tolerance, decreased balance, decreased endurance, decreased mobility, difficulty  walking, decreased ROM, decreased strength, increased edema, impaired flexibility, and pain.   ACTIVITY LIMITATIONS: carrying, lifting, bending, sitting, standing, squatting, stairs, and locomotion level  PARTICIPATION LIMITATIONS: meal prep, cleaning, laundry, driving, shopping, and community activity  PERSONAL FACTORS: Age, Fitness, Time since onset of injury/illness/exacerbation, and 3+ comorbidities: see above  are also affecting patient's functional outcome.   REHAB POTENTIAL: Good  CLINICAL DECISION MAKING: Stable/uncomplicated  EVALUATION COMPLEXITY: Low   GOALS: Goals reviewed with patient? Yes  SHORT TERM GOALS: (target date for Short term goals are 3 weeks 02/13/2024)   1.  Patient will demonstrate independent use of home exercise program to maintain progress from in clinic treatments.  Goal status: MET 02/07/24  LONG TERM GOALS: (target dates for all long term goals are 8 weeks  03/19/2024 )   1. Patient will demonstrate/report pain at worst less than or equal to 3/10 to facilitate minimal limitation in daily activity secondary to pain symptoms.  Goal status: ongoing 4/8/   2. Patient will improve PSFS to > or equal to 6.5/10 to show improved function.  Goal status: New   3. Patient will demonstrate Patient specific functional scale avg > or = 7 to indicate reduced disability due to condition.   Goal status: New   4.  Patient will demonstrate BLE MMT 4+/5 throughout to faciltiate usual transfers, stairs, squatting at [facility] for daily life.   Goal status: New   5.  Patient will demonstrate/report ability to perform 18 inch chair transfer s UE assist.  Goal status: New   6.  Patient will demonstrate TUG </=13.5sec in effort to reduce risk of falls Goal status: New   7.  Patient will demonstrate up and down a flight of stairs with single hand rail with reciprocal gait pattern Goal Status: New   PLAN:  PT FREQUENCY: 1-2x/week  PT DURATION: 8 weeks  PLANNED  INTERVENTIONS: Can include 40981- PT Re-evaluation, 97110-Therapeutic exercises, 97530- Therapeutic activity, [id number]- Neuromuscular re-education, 97535- Self Care, 97140- Manual therapy, [id number]- Gait training, [phone]- Orthotic Fit/training, [phone]- Canalith repositioning, [id number]- Aquatic Therapy, [id number]- Electrical stimulation (unattended), [id number]- Electrical stimulation (  manual), 96045 Physical performance testing, 97016- Vasopneumatic device, [id number]- Ultrasound, [id number]- Traction (mechanical), 40981- Ionotophoresis 4mg /ml Dexamethasone , Patient/Family education, Balance training, Stair training, Taping, Dry Needling, Joint mobilization, Joint manipulation, Spinal manipulation, Spinal mobilization, Scar mobilization, Vestibular training, Visual/preceptual remediation/compensation, DME instructions, Cryotherapy, and Moist heat.  All performed as medically necessary.  All included unless contraindicated  PLAN FOR NEXT SESSION: . Work on hip flexion strength and balance.    Marley Simmers, PT, DPT 02/23/24 10:10 AM

## 2024-02-27 ENCOUNTER — Encounter: Payer: Self-pay | Admitting: Physical Therapy

## 2024-02-27 ENCOUNTER — Ambulatory Visit: Admitting: Physical Therapy

## 2024-02-27 DIAGNOSIS — M6281 Muscle weakness (generalized): Secondary | ICD-10-CM | POA: Diagnosis not present

## 2024-02-27 DIAGNOSIS — R262 Difficulty in walking, not elsewhere classified: Secondary | ICD-10-CM | POA: Diagnosis not present

## 2024-02-27 DIAGNOSIS — M25551 Pain in right hip: Secondary | ICD-10-CM | POA: Diagnosis not present

## 2024-02-27 DIAGNOSIS — M25552 Pain in left hip: Secondary | ICD-10-CM

## 2024-02-27 NOTE — Therapy (Signed)
 OUTPATIENT PHYSICAL THERAPY TREATMENT   Patient Name: Lauren [name] MRN: [id number] DOB:07-23-1946, 78 y.o., female Today's Date: 02/27/2024  END OF SESSION:  PT End of Session - 02/27/24 1028     Visit Number 8    Number of Visits 16    Date for PT Re-Evaluation 03/19/24    Authorization Type [employer] $10 copay    Progress Note Due on Visit 10    PT Start Time 1010    PT Stop Time 1050    PT Time Calculation (min) 40 min    Activity Tolerance Patient tolerated treatment well    Behavior During Therapy [facility] for tasks assessed/performed                 Past Medical History:  Diagnosis Date   Anxiety    Arthritis    Back, Hip- right   Atypical chest pain 06/25/2021   Back pain    Cancer (HCC)    right breast   Cataract    Colitis    Coronary artery calcification 02/20/2020   Diabetes mellitus without complication (HCC)    Type II   Edema 03/05/2021   Edema of both lower extremities    Endometriosis    Family history of adverse reaction to anesthesia    Father - N/V - blood pressure; daughter N/V   Family history of breast cancer    Family history of colon cancer    Family history of kidney cancer    Family history of prostate cancer    Fatty liver    GERD (gastroesophageal reflux disease)    History of hiatal hernia    History of kidney stones    History of ulcerative colitis    Hypertension    Insomnia    Joint pain    Kidney problem    Lower extremity edema 03/05/2021   Lumbar disc disease    L4 L5   Neuropathy    Osteoarthritis    Other hyperlipidemia    Palpitations    Personal history of radiation therapy    Pneumonia    1983, 2003   Pure hypercholesterolemia 02/20/2020   PVCs (premature ventricular contractions)    benign   Umbilical hernia    Vitamin D  deficiency    Wears glasses    Past Surgical History:  Procedure Laterality Date   APPENDECTOMY     BREAST BIOPSY  08-13-2009  DR TSUEI   EXCISION LEFT NIPPLE DUCT    BREAST EXCISIONAL BIOPSY Left    x2   BREAST EXCISIONAL BIOPSY Right    BREAST LUMPECTOMY Right 11/16/2018   invasive ductal   CHOLECYSTECTOMY  1992   COLON RESECTION  1986   ULCERATIVE COLITIS   COLON SURGERY     ESOPHAGEAL MANOMETRY N/A 06/11/2013   Procedure: ESOPHAGEAL MANOMETRY (EM);  Surgeon: Mathew Solomon, MD;  Location: [facility];  Service: Endoscopy;  Laterality: N/A;   ESOPHAGOGASTRODUODENOSCOPY (EGD) WITH PROPOFOL  N/A 05/31/2016   Procedure: ESOPHAGOGASTRODUODENOSCOPY (EGD) WITH PROPOFOL ;  Surgeon: Ozell Blunt, MD;  Location: [facility] ENDOSCOPY;  Service: Endoscopy;  Laterality: N/A;   EXCISION RIGHT BREAST MASS   10-20-2005  DR Donata Fryer YOUNG   EYE SURGERY Bilateral 2023   cataract   FRACTURE SURGERY     left arm   kidney stone removed  12/2009   KNEE ARTHROSCOPY Left    MCL   LEFT THUMB CARPOMETACARPAL JOINT SUSPENSIONPLASTY  04-06-2006  DR Donzella Galley   LEFT WRIST ARTHROSCOPY W/ DEBRIDEMENT AND REMOVAL CYST  03-26-2004  DR Donzella Galley   MYOMECTOMY     RADIOACTIVE SEED GUIDED PARTIAL MASTECTOMY WITH AXILLARY SENTINEL LYMPH NODE BIOPSY Right 11/16/2018   Procedure: RIGHT BREAST RADIOACTIVE SEED GUIDED PARTIAL MASTECTOMY WITH  SENTINEL  NODE BIOPSY;  Surgeon: Oza Blumenthal, MD;  Location: [facility] OR;  Service: General;  Laterality: Right;   RIGHT COLECTOMY     RIGHT URETEROSCOPIC STONE EXTRACTION  12-11-2009  DR [facility]ry [facility]   TENDON RELEASE     TONSILLECTOMY     TOTAL HIP ARTHROPLASTY Right 11/26/2016   Procedure: RIGHT TOTAL HIP ARTHROPLASTY ANTERIOR APPROACH;  Surgeon: Arnie Lao, MD;  Location: [facility] ORS;  Service: Orthopedics;  Laterality: Right;   TOTAL HIP ARTHROPLASTY Left 12/06/2023   Procedure: LEFT TOTAL HIP ARTHROPLASTY ANTERIOR APPROACH;  Surgeon: Arnie Lao, MD;  Location: [facility] OR;  Service: Orthopedics;  Laterality: Left;   TOTAL KNEE ARTHROPLASTY Left 12/02/2017   Procedure: LEFT TOTAL KNEE ARTHROPLASTY;  Surgeon: Arnie Lao, MD;  Location:  [facility] ORS;  Service: Orthopedics;  Laterality: Left;   TOTAL KNEE ARTHROPLASTY Right 08/03/2022   Procedure: RIGHT TOTAL KNEE ARTHROPLASTY;  Surgeon: Arnie Lao, MD;  Location: [facility] OR;  Service: Orthopedics;  Laterality: Right;   WRIST SURGERY     both   Patient Active Problem List   Diagnosis Date Noted   Overactive bladder 02/22/2024   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/2023   SUI (stress urinary incontinence, female) 11/22/2023   Restless leg syndrome 09/22/2023   Other hyperlipidemia 06/08/2023   Unilateral primary osteoarthritis, left hip 05/11/2023   Depression 03/16/2023   Allergic rhinitis 12/29/2022   Seasonal affective disorder (HCC) 12/01/2022   BMI 32.0-32.9,adult 12/01/2022   Obesity, Beginning BMI 37.59 12/01/2022   OA (osteoarthritis) of knee 08/03/2022   Status post total right knee replacement 08/03/2022   Stress 07/19/2022   Other constipation 07/19/2022   Atypical chest pain 06/25/2021   Class 2 severe obesity with serious comorbidity and body mass index (BMI) of 37.0 to 37.9 in adult (HCC) 06/03/2021   Vitamin D  deficiency 06/03/2021   Other fatigue 04/22/2021   SOB (shortness of breath) on exertion 04/22/2021   Lower extremity edema 03/05/2021   Coronary artery calcification 02/20/2020   Pure hypercholesterolemia 02/20/2020   Unilateral primary osteoarthritis, right knee 01/03/2019   Genetic testing 12/07/2018   Family history of breast cancer    Family history of prostate cancer    Family history of colon cancer    Family history of kidney cancer    Malignant neoplasm of upper-outer quadrant of right breast in female, 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/09/2018   Diabetic neuropathy, type II diabetes mellitus (HCC) 11/09/2018   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/02/2017   Trochanteric bursitis, right hip 05/10/2017   Status post total replacement of right hip 11/26/2016   DDD (degenerative disc disease), lumbosacral  04/23/2015   Low back pain 04/23/2015   Hiatal hernia 04/13/2013   Diabetes mellitus (HCC) 01/26/2013   Umbilical hernia 07/24/2012   Essential hypertension     PCP: Jimmey Mould, MD   REFERRING PROVIDER: Shauna Del, DO   REFERRING DIAG:  [phone] (ICD-10-CM) - Proximal limb muscle weakness  M25.551,M25.552 (ICD-10-CM) - Bilateral hip pain  Z96.642 (ICD-10-CM) - Status post total replacement of left hip  Z96.641 (ICD-10-CM) - Status post total replacement of right hip    THERAPY DIAG:  Pain in left hip  Pain in right hip  Muscle weakness (generalized)  Difficulty  in walking, not elsewhere classified  Rationale for Evaluation and Treatment: Rehabilitation  ONSET DATE: Chronic pain , Patient 7wks s/p L THA 12/06/2023  SUBJECTIVE:   SUBJECTIVE STATEMENT: Relays feeling pretty good today overall with pain   PERTINENT HISTORY: R THA, L TKA, anxiety, DM  PAIN:  NPRS scale: 1-2/10 today Pain location: crease of hip/thigh, R>L Pain description: sharp, achy, weakness Aggravating factors: being on feet for a while Relieving factors: ice, rest, pain meds  PRECAUTIONS: Anterior hip  WEIGHT BEARING RESTRICTIONS: No  FALLS:  Has patient fallen in last 6 months? No  LIVING ENVIRONMENT: Lives with: lives alone, 4 mo old basset hound Lives in: House/apartment Stairs: Yes: Internal: 1 steps; going from den to [employer] Has following equipment at home: Single point cane  OCCUPATION: retired- Chiropodist  PLOF: Independent  PATIENT GOALS: to be able to be in less pain/tolerate things better  Next MD visit: 02/21/2024 Dr. Vaughn Georges   OBJECTIVE:   DIAGNOSTIC FINDINGS:  Left hip arthroplasty in expected alignment. No periprosthetic lucency or fracture. Recent postsurgical change includes air and edema in the soft tissues. Lateral skin staples in place. A right hip arthroplasty.   IMPRESSION: Left hip arthroplasty without immediate postoperative complication.     PATIENT SURVEYS:  Patient-Specific Activity Scoring Scheme  "0" represents "unable to perform." "10" represents "able to perform at prior level. 0 1 2 3 4 5 6 7 8 9  10 (Date and Score)   Activity Eval     1. Stairs 5    2. Getting in/out cars 4    3. Getting in/out bed 4   4.    5.    Score 4.33/10    Total score = sum of the activity scores/number of activities Minimum detectable change (90%CI) for average score = 2 points Minimum detectable change (90%CI) for single activity score = 3 points  COGNITION: Overall cognitive status: WFL    SENSATION: WFL  EDEMA:  Not present  MUSCLE LENGTH: Not tested  POSTURE:  No Significant postural limitations  PALPATION: Tender to Lt hip flexor tendon and anteriomedial quadricep, Rt hip flexor tendon and medial quadricep  LOWER EXTREMITY ROM:   ROM Right eval Left eval  Hip flexion 100 80  Hip extension    Hip abduction    Hip adduction    Hip internal rotation    Hip external rotation    Knee flexion    Knee extension    Ankle dorsiflexion    Ankle plantarflexion    Ankle inversion    Ankle eversion     (Blank rows = not tested)  LOWER EXTREMITY MMT:  MMT Right eval Left eval Right 02/14/24 Left 02/14/24  Hip flexion 3+ 3- 4 4-  Hip extension      Hip abduction      Hip adduction      Hip internal rotation      Hip external rotation      Knee flexion 4 4-; painful 4+ 4+  Knee extension 3+; painful 3+; painful 4+ 4+  Ankle dorsiflexion 5 5    Ankle plantarflexion      Ankle inversion      Ankle eversion       (Blank rows = not tested)  LOWER EXTREMITY SPECIAL TESTS:  Hip: none Back: (+) SLR bilateral   FUNCTIONAL TESTS:  01/23/24 18 inch chair transfer: is not able to rise to standing without use of UE on armrest and rocking for momentum TUG: 16.78 with  use of UE on armrest GAIT: Distance walked: clinical distance Assistive device utilized: None Level of assistance: SBA Comments: antalgic.  Decreased Lt LE stance time, narrow step width, decreased step length, decreased speed                                                                                                                                                                        TODAY'S TREATMENT 02/27/24 TherAct Standing marching 2x10 reps blat L3 band Standing abduction 2x10 reps with L3 band bil; UE support needed Standing hip extension green band 2X10 bilat Step ups lateral 6 inch step X 10 bilat with UE support Forward step ups 6 inch step X 10 bilat with one UE support Seated marching over cone to simulate picking leg up to get in and out of car, 3 sets of 5 over and back Tandem walk in bars without UE support to improve balance for walking in straight line 5 round trips in bars Tandem balance 30 sec X 2 bilat Walking over hurdles in bars without UE support, 3 round trips, down with right leg first, back with left leg first  Therex Nu step L6 X 8 min   02/23/24 TherEx NuStep L6 x 8 min; LEs only  TherAct Standing marching x 20 reps bil L3 band at feet Standing abduction x 20 reps L3 band bil Standing extension x 20 reps L3 band bil Lateral step ups with hip abduction hold on 6" step x 20 bil  Neuro Re-Ed Tandem walk in bars without UE support to improve balance for walking in straight line 5 round trips in bars Tandem stand 3x30 sec bil   02/16/24 TherAct Standing marching x10 reps blat L3 band Standing abduction 2x10 reps with L3 band bil; UE support needed Standing hip extension green band 2X10 bilat Seated marching over cone to simulate picking leg up to get in and out of car, 3 sets of 5 over and back Tandem walk in bars without UE support to improve balance for walking in straight line 4 round trips in bars Stepping over yoga block forward and back, X 10 bilat in bars without UE support Therex Nu step L6 X 8 min  02/14/24 TherAct Standing marching x10 reps blat L3 band Standing  abduction 2x10 reps with L3 band bil; UE support needed Standing hip extension green band 2X10 bilat Seated marching over cone to simulate picking leg up to get in and out of car, 3 sets of 5 over and back Tandem walk in bars without UE support to improve balance for walking in straight line 4 round trips in bars Stepping over yoga block forward and back, X 10 bilat in bars without UE support Therex Nu step L6 X  8 min   Manual STM and IASTM to Rt anterior hip along incision; skilled palpation and monitoring of soft tissue during DN Trigger Point Dry Needling Subsequent Treatment: Instructions provided previously at initial dry needling treatment.  Patient Verbal Consent Given: Yes Education Handout Provided: Previously Provided Muscles Treated: Proximal anterior and lateral quads Rt Electrical Stimulation Performed: No Treatment Response/Outcome:  good overall tolerance,twitch response noted     PATIENT EDUCATION:  Education details: HEP, POC Person educated: Patient Education method: Programmer, multimedia, Facilities manager, Verbal cues, and Handouts Education comprehension: verbalized understanding, returned demonstration, and verbal cues required  HOME EXERCISE PROGRAM: Access Code: [email or web address] URL: [email or web address] Date: 01/25/2024 Prepared by: Casimer Clear  Exercises - Supine Hip Abduction  - 2 x daily - 6 x weekly - 1 sets - 10 reps - alternating marching  - 2 x daily - 6 x weekly - 1 sets - 10 reps - Standing Hip Abduction with Counter Support  - 2 x daily - 6 x weekly - 1 sets - 10 reps - Standing Lumbar Extension at Wall - Forearms  - 2 x daily - 6 x weekly - 1 sets - 10 reps - Mini Squat with Counter Support  - 2 x daily - 6 x weekly - 1 sets - 10 reps - Supine March with Resistance Band  - 2 x daily - 6 x weekly - 1 sets - 10 reps  -added seated march over object, standing march over object, and tandem walking 02/14/24 to help with getting in/out of car as  well as balance for walking in straight line reducing staggering to right or left during ambulation. She denies needing picture of these.  ASSESSMENT:  CLINICAL IMPRESSION: She has one more visit with us  and then will need to transfer to our other clinic so she can begin aquatic PT, pelvic PT, and continue her current plan of care all in one place.     OBJECTIVE IMPAIRMENTS: Abnormal gait, decreased activity tolerance, decreased balance, decreased endurance, decreased mobility, difficulty walking, decreased ROM, decreased strength, increased edema, impaired flexibility, and pain.   ACTIVITY LIMITATIONS: carrying, lifting, bending, sitting, standing, squatting, stairs, and locomotion level  PARTICIPATION LIMITATIONS: meal prep, cleaning, laundry, driving, shopping, and community activity  PERSONAL FACTORS: Age, Fitness, Time since onset of injury/illness/exacerbation, and 3+ comorbidities: see above  are also affecting patient's functional outcome.   REHAB POTENTIAL: Good  CLINICAL DECISION MAKING: Stable/uncomplicated  EVALUATION COMPLEXITY: Low   GOALS: Goals reviewed with patient? Yes  SHORT TERM GOALS: (target date for Short term goals are 3 weeks 02/13/2024)   1.  Patient will demonstrate independent use of home exercise program to maintain progress from in clinic treatments.  Goal status: MET 02/07/24  LONG TERM GOALS: (target dates for all long term goals are 8 weeks  03/19/2024 )   1. Patient will demonstrate/report pain at worst less than or equal to 3/10 to facilitate minimal limitation in daily activity secondary to pain symptoms.  Goal status: ongoing 4/8/   2. Patient will improve PSFS to > or equal to 6.5/10 to show improved function.  Goal status: New   3. Patient will demonstrate Patient specific functional scale avg > or = 7 to indicate reduced disability due to condition.   Goal status: New   4.  Patient will demonstrate BLE MMT 4+/5 throughout to  faciltiate usual transfers, stairs, squatting at [facility] for daily life.   Goal status: New   5.  Patient will demonstrate/report ability to perform 50  inch chair transfer s UE assist.  Goal status: New   6.  Patient will demonstrate TUG </=13.5sec in effort to reduce risk of falls Goal status: New   7.  Patient will demonstrate up and down a flight of stairs with single hand rail with reciprocal gait pattern Goal Status: New   PLAN:  PT FREQUENCY: 1-2x/week  PT DURATION: 8 weeks  PLANNED INTERVENTIONS: Can include 91478- PT Re-evaluation, 97110-Therapeutic exercises, 97530- Therapeutic activity, 97112- Neuromuscular re-education, 97535- Self Care, 97140- Manual therapy, [phone]- Gait training, [phone]- Orthotic Fit/training, [phone]- Canalith repositioning, [id number]- Aquatic Therapy, [phone]- Electrical stimulation (unattended), [phone]- Electrical stimulation (manual), [id number] Physical performance testing, 97016- Vasopneumatic device, [id number]- Ultrasound, [id number]- Traction (mechanical), [id number]- Ionotophoresis 4mg /ml Dexamethasone , Patient/Family education, Balance training, Stair training, Taping, Dry Needling, Joint mobilization, Joint manipulation, Spinal manipulation, Spinal mobilization, Scar mobilization, Vestibular training, Visual/preceptual remediation/compensation, DME instructions, Cryotherapy, and Moist heat.  All performed as medically necessary.  All included unless contraindicated  PLAN FOR NEXT SESSION: .She has one more visit with us  at [facility] clinic, then She is going to transfer to [facility] specialty for aquatics and pelvic floor rehab as well as continuing with her current POC    Jamee Mazzoni, PT, DPT 02/27/24 10:57 AM

## 2024-02-29 ENCOUNTER — Encounter: Payer: Self-pay | Admitting: Physical Therapy

## 2024-02-29 ENCOUNTER — Ambulatory Visit: Admitting: Physical Therapy

## 2024-02-29 DIAGNOSIS — R262 Difficulty in walking, not elsewhere classified: Secondary | ICD-10-CM | POA: Diagnosis not present

## 2024-02-29 DIAGNOSIS — M6281 Muscle weakness (generalized): Secondary | ICD-10-CM

## 2024-02-29 DIAGNOSIS — M25552 Pain in left hip: Secondary | ICD-10-CM

## 2024-02-29 DIAGNOSIS — M25551 Pain in right hip: Secondary | ICD-10-CM

## 2024-02-29 NOTE — Therapy (Signed)
 OUTPATIENT PHYSICAL THERAPY TREATMENT Progress Note reporting period 01/23/24 to 02/29/24  See below for objective and subjective measurements relating to patients progress with PT.    Patient Name: Lauren [name] MRN: [id number] DOB:08/29/1946, 78 y.o., female Today's Date: 02/29/2024  END OF SESSION:  PT End of Session - 02/29/24 1015     Visit Number 9    Number of Visits 16    Date for PT Re-Evaluation 03/19/24    Authorization Type [employer] $10 copay    Progress Note Due on Visit 19    PT Start Time 1013    PT Stop Time 1052    PT Time Calculation (min) 39 min    Activity Tolerance Patient tolerated treatment well    Behavior During Therapy WFL for tasks assessed/performed                 Past Medical History:  Diagnosis Date   Anxiety    Arthritis    Back, Hip- right   Atypical chest pain 06/25/2021   Back pain    Cancer (HCC)    right breast   Cataract    Colitis    Coronary artery calcification 02/20/2020   Diabetes mellitus without complication (HCC)    Type II   Edema 03/05/2021   Edema of both lower extremities    Endometriosis    Family history of adverse reaction to anesthesia    Father - N/V - blood pressure; daughter N/V   Family history of breast cancer    Family history of colon cancer    Family history of kidney cancer    Family history of prostate cancer    Fatty liver    GERD (gastroesophageal reflux disease)    History of hiatal hernia    History of kidney stones    History of ulcerative colitis    Hypertension    Insomnia    Joint pain    Kidney problem    Lower extremity edema 03/05/2021   Lumbar disc disease    L4 L5   Neuropathy    Osteoarthritis    Other hyperlipidemia    Palpitations    Personal history of radiation therapy    Pneumonia    1983, 2003   Pure hypercholesterolemia 02/20/2020   PVCs (premature ventricular contractions)    benign   Umbilical hernia    Vitamin D  deficiency    Wears glasses     Past Surgical History:  Procedure Laterality Date   APPENDECTOMY     BREAST BIOPSY  08-13-2009  DR TSUEI   EXCISION LEFT NIPPLE DUCT   BREAST EXCISIONAL BIOPSY Left    x2   BREAST EXCISIONAL BIOPSY Right    BREAST LUMPECTOMY Right 11/16/2018   invasive ductal   CHOLECYSTECTOMY  1992   COLON RESECTION  1986   ULCERATIVE COLITIS   COLON SURGERY     ESOPHAGEAL MANOMETRY N/A 06/11/2013   Procedure: ESOPHAGEAL MANOMETRY (EM);  Surgeon: Mathew Solomon, MD;  Location: [facility];  Service: Endoscopy;  Laterality: N/A;   ESOPHAGOGASTRODUODENOSCOPY (EGD) WITH PROPOFOL  N/A 05/31/2016   Procedure: ESOPHAGOGASTRODUODENOSCOPY (EGD) WITH PROPOFOL ;  Surgeon: Ozell Blunt, MD;  Location[facility];  Service: Endoscopy;  Laterality: N/A;   EXCISION RIGHT BREAST MASS   10-20-2005  DR Donata Fryer YOUNG   EYE SURGERY Bilateral 2023   cataract   FRACTURE SURGERY     left arm   kidney stone removed  12/2009   KNEE ARTHROSCOPY Left    MCL  LEFT THUMB CARPOMETACARPAL JOINT SUSPENSIONPLASTY  04-06-2006  DR Donzella Galley   LEFT WRIST ARTHROSCOPY W/ DEBRIDEMENT AND REMOVAL CYST  03-26-2004  DR Donzella Galley   MYOMECTOMY     RADIOACTIVE SEED GUIDED PARTIAL MASTECTOMY WITH AXILLARY SENTINEL LYMPH NODE BIOPSY Right 11/16/2018   Procedure: RIGHT BREAST RADIOACTIVE SEED GUIDED PARTIAL MASTECTOMY WITH  SENTINEL  NODE BIOPSY;  Surgeon: Oza Blumenthal, MD;  Location: [facility] OR;  Service: General;  Laterality: Right;   RIGHT COLECTOMY     RIGHT URETEROSCOPIC STONE EXTRACTION  12-11-2009  DR Autry [facility]   TENDON RELEASE     TONSILLECTOMY     TOTAL HIP ARTHROPLASTY Right 11/26/2016   Procedure: RIGHT TOTAL HIP ARTHROPLASTY ANTERIOR APPROACH;  Surgeon: Arnie Lao, MD;  Location: [facility] ORS;  Service: Orthopedics;  Laterality: Right;   TOTAL HIP ARTHROPLASTY Left 12/06/2023   Procedure: LEFT TOTAL HIP ARTHROPLASTY ANTERIOR APPROACH;  Surgeon: Arnie Lao, MD;  Location: [facility] OR;  Service: Orthopedics;  Laterality:  Left;   TOTAL KNEE ARTHROPLASTY Left 12/02/2017   Procedure: LEFT TOTAL KNEE ARTHROPLASTY;  Surgeon: Arnie Lao, MD;  Location: [facility];  Service: Orthopedics;  Laterality: Left;   TOTAL KNEE ARTHROPLASTY Right 08/03/2022   Procedure: RIGHT TOTAL KNEE ARTHROPLASTY;  Surgeon: Arnie Lao, MD;  Location: [facility] OR;  Service: Orthopedics;  Laterality: Right;   WRIST SURGERY     both   Patient Active Problem List   Diagnosis Date Noted   Overactive bladder 02/22/2024   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/2023   SUI (stress urinary incontinence, female) 11/22/2023   Restless leg syndrome 09/22/2023   Other hyperlipidemia 06/08/2023   Unilateral primary osteoarthritis, left hip 05/11/2023   Depression 03/16/2023   Allergic rhinitis 12/29/2022   Seasonal affective disorder (HCC) 12/01/2022   BMI 32.0-32.9,adult 12/01/2022   Obesity, Beginning BMI 37.59 12/01/2022   OA (osteoarthritis) of knee 08/03/2022   Status post total right knee replacement 08/03/2022   Stress 07/19/2022   Other constipation 07/19/2022   Atypical chest pain 06/25/2021   Class 2 severe obesity with serious comorbidity and body mass index (BMI) of 37.0 to 37.9 in adult (HCC) 06/03/2021   Vitamin D  deficiency 06/03/2021   Other fatigue 04/22/2021   SOB (shortness of breath) on exertion 04/22/2021   Lower extremity edema 03/05/2021   Coronary artery calcification 02/20/2020   Pure hypercholesterolemia 02/20/2020   Unilateral primary osteoarthritis, right knee 01/03/2019   Genetic testing 12/07/2018   Family history of breast cancer    Family history of prostate cancer    Family history of colon cancer    Family history of kidney cancer    Malignant neoplasm of upper-outer quadrant of right breast in female, 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/09/2018   Diabetic neuropathy, type II diabetes mellitus (HCC) 11/09/2018   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/02/2017   Trochanteric  bursitis, right hip 05/10/2017   Status post total replacement of right hip 11/26/2016   DDD (degenerative disc disease), 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   Low back pain 04/23/2015   Hiatal hernia 04/13/2013   Diabetes mellitus (HCC) 01/26/2013   Umbilical hernia 07/24/2012   Essential hypertension     PCP: Jimmey Mould, MD   REFERRING PROVIDER: Shauna Del, DO   REFERRING DIAG:  [id number] (ICD-10-CM) - Proximal limb muscle weakness  M25.551,M25.552 (ICD-10-CM) - Bilateral hip pain  Z96.642 (ICD-10-CM) - Status post total replacement of left hip  Z96.641 (ICD-10-CM) - Status post total replacement of right hip  THERAPY DIAG:  Pain in left hip  Pain in right hip  Muscle weakness (generalized)  Difficulty in walking, not elsewhere classified  Rationale for Evaluation and Treatment: Rehabilitation  ONSET DATE: Chronic pain , Patient 7wks s/p L THA 12/06/2023  SUBJECTIVE:   SUBJECTIVE STATEMENT: Relays feeling pretty good today overall with pain. She may take some time off for beach trip before    PERTINENT HISTORY: R THA, L TKA, anxiety, DM  PAIN:  NPRS scale: 1-2/10 today Pain location: crease of hip/thigh, R>L Pain description: sharp, achy, weakness Aggravating factors: being on feet for a while Relieving factors: ice, rest, pain meds  PRECAUTIONS: Anterior hip  WEIGHT BEARING RESTRICTIONS: No  FALLS:  Has patient fallen in last 6 months? No  LIVING ENVIRONMENT: Lives with: lives alone, 4 mo old basset hound Lives in: House/apartment Stairs: Yes: Internal: 1 steps; going from den to [employer] Has following equipment at home: Single point cane  OCCUPATION: retired- Chiropodist  PLOF: Independent  PATIENT GOALS: to be able to be in less pain/tolerate things better  Next MD visit: 02/21/2024 Dr. Vaughn Georges   OBJECTIVE:   DIAGNOSTIC FINDINGS:  Left hip arthroplasty in expected alignment. No periprosthetic lucency or fracture. Recent postsurgical change  includes air and edema in the soft tissues. Lateral skin staples in place. A right hip arthroplasty.   IMPRESSION: Left hip arthroplasty without immediate postoperative complication.    PATIENT SURVEYS:  Patient-Specific Activity Scoring Scheme  "0" represents "unable to perform." "10" represents "able to perform at prior level. 0 1 2 3 4 5 6 7 8 9  10 (Date and Score)   Activity Eval     1. Stairs 5    2. Getting in/out cars 4    3. Getting in/out bed 4   4.    5.    Score 4.33/10    Total score = sum of the activity scores/number of activities Minimum detectable change (90%CI) for average score = 2 points Minimum detectable change (90%CI) for single activity score = 3 points  COGNITION: Overall cognitive status: WFL    SENSATION: WFL  EDEMA:  Not present  MUSCLE LENGTH: Not tested  POSTURE:  No Significant postural limitations  PALPATION: Tender to Lt hip flexor tendon and anteriomedial quadricep, Rt hip flexor tendon and medial quadricep  LOWER EXTREMITY ROM:   ROM Right eval Left eval Left 02/29/24  Hip flexion 100 80 88  Hip extension     Hip abduction     Hip adduction     Hip internal rotation     Hip external rotation     Knee flexion     Knee extension     Ankle dorsiflexion     Ankle plantarflexion     Ankle inversion     Ankle eversion      (Blank rows = not tested)  LOWER EXTREMITY MMT:  MMT Right eval Left eval Right 02/14/24 Left 02/14/24 Left 02/29/24  Hip flexion 3+ 3- 4 4- 4  Hip extension       Hip abduction       Hip adduction       Hip internal rotation       Hip external rotation       Knee flexion 4 4-; painful 4+ 4+ 5  Knee extension 3+; painful 3+; painful 4+ 4+ 5  Ankle dorsiflexion 5 5     Ankle plantarflexion       Ankle inversion  Ankle eversion        (Blank rows = not tested)  LOWER EXTREMITY SPECIAL TESTS:  Hip: none Back: (+) SLR bilateral   FUNCTIONAL TESTS:  01/23/24 18 inch chair transfer:  is not able to rise to standing without use of UE on armrest and rocking for momentum TUG: 16.78 with use of UE on armrest  02/29/24 Can not perform sit to stand without UE support now TUG 10.84 seconds  GAIT: Distance walked: clinical distance Assistive device utilized: None Level of assistance: SBA Comments: antalgic. Decreased Lt LE stance time, narrow step width, decreased step length, decreased speed                                                                                                                                                                        TODAY'S TREATMENT 02/29/24 TherAct Standing marching 2x10 reps blat L3 band Standing abduction 2x10 reps with L3 band bil; UE support needed Standing hip extension green band 2X10 bilat Step ups lateral 6 inch step X 10 bilat with UE support Forward step ups 6 inch step X 10 bilat with one UE support Seated marching over cone to simulate picking leg up to get in and out of car, 3 sets of 5 over and back Tandem walk in bars without UE support to improve balance for walking in straight line 5 round trips in bars Tandem balance 30 sec X 2 bilat  Therex Recumbent bike L1 X 5 min   02/27/24 TherAct Standing marching 2x10 reps blat L3 band Standing abduction 2x10 reps with L3 band bil; UE support needed Standing hip extension green band 2X10 bilat Step ups lateral 6 inch step X 10 bilat with UE support Forward step ups 6 inch step X 10 bilat with one UE support Seated marching over cone to simulate picking leg up to get in and out of car, 3 sets of 5 over and back Tandem walk in bars without UE support to improve balance for walking in straight line 5 round trips in bars Tandem balance 30 sec X 2 bilat Walking over hurdles in bars without UE support, 3 round trips, down with right leg first, back with left leg first  Therex Nu step L6 X 8 min   02/23/24 TherEx NuStep L6 x 8 min; LEs only  TherAct Standing  marching x 20 reps bil L3 band at feet Standing abduction x 20 reps L3 band bil Standing extension x 20 reps L3 band bil Lateral step ups with hip abduction hold on 6" step x 20 bil  Neuro Re-Ed Tandem walk in bars without UE support to improve balance for walking in straight line 5 round trips in bars Tandem stand 3x30 sec bil  PATIENT EDUCATION:  Education details: HEP, POC Person educated: Patient Education method: Programmer, multimedia, Demonstration, Verbal cues, and Handouts Education comprehension: verbalized understanding, returned demonstration, and verbal cues required  HOME EXERCISE PROGRAM: Access Code: [email or web address] URL: [email or web address] Date: 01/25/2024 Prepared by: Casimer Clear  Exercises - Supine Hip Abduction  - 2 x daily - 6 x weekly - 1 sets - 10 reps - alternating marching  - 2 x daily - 6 x weekly - 1 sets - 10 reps - Standing Hip Abduction with Counter Support  - 2 x daily - 6 x weekly - 1 sets - 10 reps - Standing Lumbar Extension at Wall - Forearms  - 2 x daily - 6 x weekly - 1 sets - 10 reps - Mini Squat with Counter Support  - 2 x daily - 6 x weekly - 1 sets - 10 reps - Supine March with Resistance Band  - 2 x daily - 6 x weekly - 1 sets - 10 reps  -added seated march over object, standing march over object, and tandem walking 02/14/24 to help with getting in/out of car as well as balance for walking in straight line reducing staggering to right or left during ambulation. She denies needing picture of these.  ASSESSMENT:  CLINICAL IMPRESSION: She has made good progress lately with hip ROM, hip strength, and getting in/out of chair and car easier. This is the last visit she has with us  at [facility] and then will transfer her care over to our [facility] where she can also incorporate aquatic PT and pelvic health.     OBJECTIVE IMPAIRMENTS: Abnormal gait, decreased activity tolerance, decreased balance, decreased endurance,  decreased mobility, difficulty walking, decreased ROM, decreased strength, increased edema, impaired flexibility, and pain.   ACTIVITY LIMITATIONS: carrying, lifting, bending, sitting, standing, squatting, stairs, and locomotion level  PARTICIPATION LIMITATIONS: meal prep, cleaning, laundry, driving, shopping, and community activity  PERSONAL FACTORS: Age, Fitness, Time since onset of injury/illness/exacerbation, and 3+ comorbidities: see above  are also affecting patient's functional outcome.   REHAB POTENTIAL: Good  CLINICAL DECISION MAKING: Stable/uncomplicated  EVALUATION COMPLEXITY: Low   GOALS: Goals reviewed with patient? Yes  SHORT TERM GOALS: (target date for Short term goals are 3 weeks 02/13/2024)   1.  Patient will demonstrate independent use of home exercise program to maintain progress from in clinic treatments.  Goal status: MET 02/07/24  LONG TERM GOALS: (target dates for all long term goals are 8 weeks  03/19/2024 )   1. Patient will demonstrate/report pain at worst less than or equal to 3/10 to facilitate minimal limitation in daily activity secondary to pain symptoms.  Goal status: some MET 02/29/24   2. Patient will improve PSFS to > or equal to 6.5/10 to show improved function.  Goal status: ongoing 02/29/24.    3.  Patient will demonstrate BLE MMT 4+/5 throughout to faciltiate usual transfers, stairs, squatting at [facility] for daily life.   Goal status: MET 02/29/24   4.  Patient will demonstrate/report ability to perform 18 inch chair transfer s UE assist.  Goal status: MET 02/29/24   5.  Patient will demonstrate TUG </=13.5sec in effort to reduce risk of falls Goal status:MET  02/29/24   6.  Patient will demonstrate up and down a flight of stairs with single hand rail with reciprocal gait pattern Goal Status: ongoing 02/29/24   PLAN:  PT FREQUENCY: 1-2x/week  PT DURATION: 8 weeks  PLANNED INTERVENTIONS: Can include 08657- PT Re-evaluation,  97110-Therapeutic exercises, 97530-  Therapeutic activity, [id number]- Neuromuscular re-education, [phone]- Self Care, 60454- Manual therapy, [phone]- Gait training, [id number]- Orthotic Fit/training, [phone]- Canalith repositioning, [id number]- Aquatic Therapy, [phone]- Electrical stimulation (unattended), [phone]- Electrical stimulation (manual), [id number] Physical performance testing, 97016- Vasopneumatic device, [id number]- Ultrasound, [id number]- Traction (mechanical), [id number]- Ionotophoresis 4mg /ml Dexamethasone , Patient/Family education, Balance training, Stair training, Taping, Dry Needling, Joint mobilization, Joint manipulation, Spinal manipulation, Spinal mobilization, Scar mobilization, Vestibular training, Visual/preceptual remediation/compensation, DME instructions, Cryotherapy, and Moist heat.  All performed as medically necessary.  All included unless contraindicated  Plan for next visit: She is going to transfer to [facility] for aquatics and pelvic floor rehab as well as continuing with her current POC    Jamee Mazzoni, PT, DPT 02/29/24 11:01 AM

## 2024-03-02 ENCOUNTER — Other Ambulatory Visit (INDEPENDENT_AMBULATORY_CARE_PROVIDER_SITE_OTHER): Payer: Self-pay | Admitting: Family Medicine

## 2024-03-02 DIAGNOSIS — J3089 Other allergic rhinitis: Secondary | ICD-10-CM

## 2024-03-05 ENCOUNTER — Encounter: Admitting: Rehabilitative and Restorative Service Providers"

## 2024-03-07 ENCOUNTER — Ambulatory Visit: Attending: Orthopedic Surgery | Admitting: Rehabilitative and Restorative Service Providers"

## 2024-03-07 ENCOUNTER — Encounter: Payer: Self-pay | Admitting: Rehabilitative and Restorative Service Providers"

## 2024-03-07 DIAGNOSIS — M6281 Muscle weakness (generalized): Secondary | ICD-10-CM | POA: Diagnosis not present

## 2024-03-07 DIAGNOSIS — R262 Difficulty in walking, not elsewhere classified: Secondary | ICD-10-CM | POA: Diagnosis not present

## 2024-03-07 DIAGNOSIS — M25551 Pain in right hip: Secondary | ICD-10-CM | POA: Diagnosis not present

## 2024-03-07 DIAGNOSIS — M25552 Pain in left hip: Secondary | ICD-10-CM | POA: Diagnosis not present

## 2024-03-07 NOTE — Therapy (Signed)
 OUTPATIENT PHYSICAL THERAPY TREATMENT AND REASSESSMENT NOTE    Patient Name: Lauren [name] MRN: [id number] DOB:12-21-1945, 78 y.o., female Today's Date: 03/07/2024   Progress Note Reporting Period 01/23/2024 to 03/07/2024  See note below for Objective Data and Assessment of Progress/Goals.       END OF SESSION:  PT End of Session - 03/07/24 1104     Visit Number 10    Date for PT Re-Evaluation 05/11/24    Authorization Type [employer] $10 copay    Progress Note Due on Visit 20    PT Start Time 1100    PT Stop Time 1140    PT Time Calculation (min) 40 min    Activity Tolerance Patient tolerated treatment well    Behavior During Therapy WFL for tasks assessed/performed                 Past Medical History:  Diagnosis Date   Anxiety    Arthritis    Back, Hip- right   Atypical chest pain 06/25/2021   Back pain    Cancer (HCC)    right breast   Cataract    Colitis    Coronary artery calcification 02/20/2020   Diabetes mellitus without complication (HCC)    Type II   Edema 03/05/2021   Edema of both lower extremities    Endometriosis    Family history of adverse reaction to anesthesia    Father - N/V - blood pressure; daughter N/V   Family history of breast cancer    Family history of colon cancer    Family history of kidney cancer    Family history of prostate cancer    Fatty liver    GERD (gastroesophageal reflux disease)    History of hiatal hernia    History of kidney stones    History of ulcerative colitis    Hypertension    Insomnia    Joint pain    Kidney problem    Lower extremity edema 03/05/2021   Lumbar disc disease    L4 L5   Neuropathy    Osteoarthritis    Other hyperlipidemia    Palpitations    Personal history of radiation therapy    Pneumonia    1983, 2003   Pure hypercholesterolemia 02/20/2020   PVCs (premature ventricular contractions)    benign   Umbilical hernia    Vitamin D  deficiency    Wears glasses     Past Surgical History:  Procedure Laterality Date   APPENDECTOMY     BREAST BIOPSY  08-13-2009  DR TSUEI   EXCISION LEFT NIPPLE DUCT   BREAST EXCISIONAL BIOPSY Left    x2   BREAST EXCISIONAL BIOPSY Right    BREAST LUMPECTOMY Right 11/16/2018   invasive ductal   CHOLECYSTECTOMY  1992   COLON RESECTION  1986   ULCERATIVE COLITIS   COLON SURGERY     ESOPHAGEAL MANOMETRY N/A 06/11/2013   Procedure: ESOPHAGEAL MANOMETRY (EM);  Surgeon: Mathew Solomon, MD;  Location: [facility];  Service: Endoscopy;  Laterality: N/A;   ESOPHAGOGASTRODUODENOSCOPY (EGD) WITH PROPOFOL  N/A 05/31/2016   Procedure: ESOPHAGOGASTRODUODENOSCOPY (EGD) WITH PROPOFOL ;  Surgeon: Ozell Blunt, MD;  Location: [facility];  Service: Endoscopy;  Laterality: N/A;   EXCISION RIGHT BREAST MASS   10-20-2005  DR Donata Fryer YOUNG   EYE SURGERY Bilateral 2023   cataract   FRACTURE SURGERY     left arm   kidney stone removed  12/2009   KNEE ARTHROSCOPY Left    MCL  LEFT THUMB CARPOMETACARPAL JOINT SUSPENSIONPLASTY  04-06-2006  DR Donzella Galley   LEFT WRIST ARTHROSCOPY W/ DEBRIDEMENT AND REMOVAL CYST  03-26-2004  DR Donzella Galley   MYOMECTOMY     RADIOACTIVE SEED GUIDED PARTIAL MASTECTOMY WITH AXILLARY SENTINEL LYMPH NODE BIOPSY Right 11/16/2018   Procedure: RIGHT BREAST RADIOACTIVE SEED GUIDED PARTIAL MASTECTOMY WITH  SENTINEL  NODE BIOPSY;  Surgeon: Oza Blumenthal, MD;  Location: [facility] OR;  Service: General;  Laterality: Right;   RIGHT COLECTOMY     RIGHT URETEROSCOPIC STONE EXTRACTION  12-11-2009  DR Autry [facility]   TENDON RELEASE     TONSILLECTOMY     TOTAL HIP ARTHROPLASTY Right 11/26/2016   Procedure: RIGHT TOTAL HIP ARTHROPLASTY ANTERIOR APPROACH;  Surgeon: Arnie Lao, MD;  Location: [facility];  Service: Orthopedics;  Laterality: Right;   TOTAL HIP ARTHROPLASTY Left 12/06/2023   Procedure: LEFT TOTAL HIP ARTHROPLASTY ANTERIOR APPROACH;  Surgeon: Arnie Lao, MD;  Location: [facility] OR;  Service: Orthopedics;  Laterality:  Left;   TOTAL KNEE ARTHROPLASTY Left 12/02/2017   Procedure: LEFT TOTAL KNEE ARTHROPLASTY;  Surgeon: Arnie Lao, MD;  Location: [facility];  Service: Orthopedics;  Laterality: Left;   TOTAL KNEE ARTHROPLASTY Right 08/03/2022   Procedure: RIGHT TOTAL KNEE ARTHROPLASTY;  Surgeon: Arnie Lao, MD;  Location: [facility];  Service: Orthopedics;  Laterality: Right;   WRIST SURGERY     both   Patient Active Problem List   Diagnosis Date Noted   Overactive bladder 02/22/2024   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/2023   SUI (stress urinary incontinence, female) 11/22/2023   Restless leg syndrome 09/22/2023   Other hyperlipidemia 06/08/2023   Unilateral primary osteoarthritis, left hip 05/11/2023   Depression 03/16/2023   Allergic rhinitis 12/29/2022   Seasonal affective disorder (HCC) 12/01/2022   BMI 32.0-32.9,adult 12/01/2022   Obesity, Beginning BMI 37.59 12/01/2022   OA (osteoarthritis) of knee 08/03/2022   Status post total right knee replacement 08/03/2022   Stress 07/19/2022   Other constipation 07/19/2022   Atypical chest pain 06/25/2021   Class 2 severe obesity with serious comorbidity and body mass index (BMI) of 37.0 to 37.9 in adult (HCC) 06/03/2021   Vitamin D  deficiency 06/03/2021   Other fatigue 04/22/2021   SOB (shortness of breath) on exertion 04/22/2021   Lower extremity edema 03/05/2021   Coronary artery calcification 02/20/2020   Pure hypercholesterolemia 02/20/2020   Unilateral primary osteoarthritis, right knee 01/03/2019   Genetic testing 12/07/2018   Family history of breast cancer    Family history of prostate cancer    Family history of colon cancer    Family history of kidney cancer    Malignant neoplasm of upper-outer quadrant of right breast in female, 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/09/2018   Diabetic neuropathy, type II diabetes mellitus (HCC) 11/09/2018   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/02/2017   Trochanteric  bursitis, right hip 05/10/2017   Status post total replacement of right hip 11/26/2016   DDD (degenerative disc disease), 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   Low back pain 04/23/2015   Hiatal hernia 04/13/2013   Diabetes mellitus (HCC) 01/26/2013   Umbilical hernia 07/24/2012   Essential hypertension     PCP: Jimmey Mould, MD   REFERRING PROVIDER: Shauna Del, DO   REFERRING DIAG:  [id number] (ICD-10-CM) - Proximal limb muscle weakness  M25.551,M25.552 (ICD-10-CM) - Bilateral hip pain  Z96.642 (ICD-10-CM) - Status post total replacement of left hip  Z96.641 (ICD-10-CM) - Status post total replacement of right hip  THERAPY DIAG:  Pain in left hip - Plan: PT plan of care cert/re-cert  Pain in right hip - Plan: PT plan of care cert/re-cert  Muscle weakness (generalized) - Plan: PT plan of care cert/re-cert  Difficulty in walking, not elsewhere classified - Plan: PT plan of care cert/re-cert  Rationale for Evaluation and Treatment: Rehabilitation  ONSET DATE: Chronic pain , Patient 7wks s/p L THA 12/06/2023  SUBJECTIVE:   SUBJECTIVE STATEMENT: Patient reports that she is still having difficulty getting in and out of her car.  States that she is ready to join aquatics PT.   PERTINENT HISTORY: R THA, L TKA, anxiety, DM  PAIN:  NPRS scale: 3-5/10 today Pain location: Right hip Pain description: sharp, achy, weakness Aggravating factors: being on feet for a while Relieving factors: ice, rest, pain meds  PRECAUTIONS: Anterior hip  WEIGHT BEARING RESTRICTIONS: No  FALLS:  Has patient fallen in last 6 months? No  LIVING ENVIRONMENT: Lives with: lives alone, 4 mo old basset hound Lives in: House/apartment Stairs: Yes: Internal: 1 steps; going from den to [employer] Has following equipment at home: Single point cane  OCCUPATION: retired- Chiropodist  PLOF: Independent  PATIENT GOALS: to be able to be in less pain/tolerate things better  Next MD visit: Dr. Vaughn Georges in June  2025   OBJECTIVE:   DIAGNOSTIC FINDINGS:  Left hip arthroplasty in expected alignment. No periprosthetic lucency or fracture. Recent postsurgical change includes air and edema in the soft tissues. Lateral skin staples in place. A right hip arthroplasty.   IMPRESSION: Left hip arthroplasty without immediate postoperative complication.    PATIENT SURVEYS:  Patient-Specific Activity Scoring Scheme  "0" represents "unable to perform." "10" represents "able to perform at prior level. 0 1 2 3 4 5 6 7 8 9  10 (Date and Score)   Activity Eval  03/07/2024   1. Stairs 5  6  2. Getting in/out cars 4  8  3. Getting in/out bed 4 10  4.    5.    Score 4.33/10 8/10   Total score = sum of the activity scores/number of activities Minimum detectable change (90%CI) for average score = 2 points Minimum detectable change (90%CI) for single activity score = 3 points  COGNITION: Overall cognitive status: WFL    SENSATION: WFL  EDEMA:  Not present  MUSCLE LENGTH: Not tested  POSTURE:  No Significant postural limitations  PALPATION: Tender to Lt hip flexor tendon and anteriomedial quadricep, Rt hip flexor tendon and medial quadricep  LOWER EXTREMITY ROM:   ROM Right eval Left eval Left 02/29/24  Hip flexion 100 80 88  Hip extension     Hip abduction     Hip adduction     Hip internal rotation     Hip external rotation     Knee flexion     Knee extension     Ankle dorsiflexion     Ankle plantarflexion     Ankle inversion     Ankle eversion      (Blank rows = not tested)  LOWER EXTREMITY MMT:  MMT Right eval Left eval Right 02/14/24 Left 02/14/24 Left 02/29/24  Hip flexion 3+ 3- 4 4- 4  Hip extension       Hip abduction       Hip adduction       Hip internal rotation       Hip external rotation       Knee flexion 4 4-; painful 4+ 4+ 5  Knee extension 3+; painful 3+; painful 4+ 4+ 5  Ankle dorsiflexion 5 5     Ankle plantarflexion       Ankle inversion        Ankle eversion        (Blank rows = not tested)  LOWER EXTREMITY SPECIAL TESTS:  Hip: none Back: (+) SLR bilateral   FUNCTIONAL TESTS:  01/23/24 18 inch chair transfer: is not able to rise to standing without use of UE on armrest and rocking for momentum TUG: 16.78 with use of UE on armrest  02/29/24 Can not perform sit to stand without UE support now TUG 10.84 seconds  03/07/2024 5 times sit to stand:  15.57 sec Timed Up and Go (TUG):  9.87 sec 6 minute walk test:  954 ft with RPE of 7/10 (age related norms 1,200 ft Modified CTSIB:  condition 1- 30 sec, condition 2- 30 sec (with sway noted), condition 3-  30 sec, condition 4- 30 sec (with increased sway noted).  Total 120/120  GAIT: Distance walked: clinical distance Assistive device utilized: None Level of assistance: SBA Comments: antalgic. Decreased Lt LE stance time, narrow step width, decreased step length, decreased speed                                                                                                                                                                        TODAY'S TREATMENT 03/07/2024: Nustep level 5 x6 min with PT present to discuss status PSFS average is 8/10 5 times sit to stand and TUG 6 minute walk test:  954 ft with RPE of 7/10 Modified CTSIB with close SBA when eyes closed Seated with 6# dumbbell on thigh performing him flexion/abduction over small cone.  2x10 bilat Marching holding 6# dumbbell x10 bilat (Farmer's carry high march) Education on Aquatics PT   02/29/24 TherAct Standing marching 2x10 reps blat L3 band Standing abduction 2x10 reps with L3 band bil; UE support needed Standing hip extension green band 2X10 bilat Step ups lateral 6 inch step X 10 bilat with UE support Forward step ups 6 inch step X 10 bilat with one UE support Seated marching over cone to simulate picking leg up to get in and out of car, 3 sets of 5 over and back Tandem walk in bars without UE support  to improve balance for walking in straight line 5 round trips in bars Tandem balance 30 sec X 2 bilat  Therex Recumbent bike L1 X 5 min   02/27/24 TherAct Standing marching 2x10 reps blat L3 band Standing abduction 2x10 reps with L3 band bil; UE support needed Standing hip extension green band 2X10 bilat Step ups lateral 6 inch step X 10 bilat with UE support Forward step  ups 6 inch step X 10 bilat with one UE support Seated marching over cone to simulate picking leg up to get in and out of car, 3 sets of 5 over and back Tandem walk in bars without UE support to improve balance for walking in straight line 5 round trips in bars Tandem balance 30 sec X 2 bilat Walking over hurdles in bars without UE support, 3 round trips, down with right leg first, back with left leg first  Therex Nu step L6 X 8 min      PATIENT EDUCATION:  Education details: HEP, POC Person educated: Patient Education method: Programmer, multimedia, Demonstration, Verbal cues, and Handouts Education comprehension: verbalized understanding, returned demonstration, and verbal cues required  HOME EXERCISE PROGRAM: Access Code: [email or web address] URL: [email or web address] Date: 01/25/2024 Prepared by: Casimer Clear  Exercises - Supine Hip Abduction  - 2 x daily - 6 x weekly - 1 sets - 10 reps - alternating marching  - 2 x daily - 6 x weekly - 1 sets - 10 reps - Standing Hip Abduction with Counter Support  - 2 x daily - 6 x weekly - 1 sets - 10 reps - Standing Lumbar Extension at Wall - Forearms  - 2 x daily - 6 x weekly - 1 sets - 10 reps - Mini Squat with Counter Support  - 2 x daily - 6 x weekly - 1 sets - 10 reps - Supine March with Resistance Band  - 2 x daily - 6 x weekly - 1 sets - 10 reps  -added seated march over object, standing march over object, and tandem walking 02/14/24 to help with getting in/out of car as well as balance for walking in straight line reducing staggering to right or left during  ambulation. She denies needing picture of these.  ASSESSMENT:  CLINICAL IMPRESSION: Ms Mattis presents to skilled PT for re-evaluation prior to starting aquatic PT.  Patient states that she feels that her biggest problem at this time is balance and getting in and out of some cars.  Patient able to perform various functional tests today and her biggest difficulty with 6 minute walk test and dyspnea and unsteady gait during task.  Patient educated on aquatic PT and she is looking forward to beginning.  Patient continues to progress towards  LE strength required for stair negotiation and car transfers.  Patient would benefit from continued skilled PT of 1-2x/week for 8 weeks to progress towards goal related activities.    OBJECTIVE IMPAIRMENTS: Abnormal gait, decreased activity tolerance, decreased balance, decreased endurance, decreased mobility, difficulty walking, decreased ROM, decreased strength, increased edema, impaired flexibility, and pain.   ACTIVITY LIMITATIONS: carrying, lifting, bending, sitting, standing, squatting, stairs, and locomotion level  PARTICIPATION LIMITATIONS: meal prep, cleaning, laundry, driving, shopping, and community activity  PERSONAL FACTORS: Age, Fitness, Time since onset of injury/illness/exacerbation, and 3+ comorbidities: see above  are also affecting patient's functional outcome.   REHAB POTENTIAL: Good  CLINICAL DECISION MAKING: Stable/uncomplicated  EVALUATION COMPLEXITY: Low   GOALS: Goals reviewed with patient? Yes  SHORT TERM GOALS: (target date for Short term goals are 3 weeks 02/13/2024)   1.  Patient will demonstrate independent use of home exercise program to maintain progress from in clinic treatments.  Goal status: MET 02/07/24  LONG TERM GOALS: (target dates for all long term goals are 8 weeks  05/11/2024 )   1. Patient will demonstrate/report pain at worst less than or equal to 3/10 to facilitate minimal limitation in daily activity  secondary to pain symptoms. Goal status: some MET 02/29/24   2. Patient will improve PSFS to > or equal to 6.5/10 to show improved function. Goal status: Met on 03/07/2024    3.  Patient will demonstrate BLE MMT 4+/5 throughout to faciltiate usual transfers, stairs, squatting at [facility] for daily life.  Goal status: MET 02/29/24   4.  Patient will demonstrate/report ability to perform 18 inch chair transfer s UE assist.  Goal status: MET 02/29/24   5.  Patient will demonstrate TUG </=13.5sec in effort to reduce risk of falls Goal status:MET  02/29/24   6.  Patient will demonstrate up and down a flight of stairs with single hand rail with reciprocal gait pattern Goal Status: ongoing 02/29/24  7.  Patient will increase her 6 minute walk test to at least 1,200 ft with no loss of balance and no increased lateral sway.  Baseline:  954 ft with RPE of 7/10  Goal Status:  NEW on 03/07/24   PLAN:  PT FREQUENCY: 1-2x/week  PT DURATION: 8 weeks  PLANNED INTERVENTIONS: Can include 40981- PT Re-evaluation, 97110-Therapeutic exercises, 97530- Therapeutic activity, 97112- Neuromuscular re-education, 97535- Self Care, 97140- Manual therapy, [phone]- Gait training, [phone]- Orthotic Fit/training, [phone]- Canalith repositioning, [id number]- Aquatic Therapy, [phone]- Electrical stimulation (unattended), [phone]- Electrical stimulation (manual), [id number] Physical performance testing, 97016- Vasopneumatic device, [id number]- Ultrasound, [id number]- Traction (mechanical), [id number]- Ionotophoresis 4mg /ml Dexamethasone , Patient/Family education, Balance training, Stair training, Taping, Dry Needling, Joint mobilization, Joint manipulation, Spinal manipulation, Spinal mobilization, Scar mobilization, Vestibular training, Visual/preceptual remediation/compensation, DME instructions, Cryotherapy, and Moist heat.  All performed as medically necessary.  All included unless contraindicated  Plan for next visit:  Assess and progress HEP as indicated,  Aquatics, strengthening, flexibility, manual/dry needling as indicated    Robyne Christen, PT, DPT 03/07/24, 1:28 PM  [facility] [address], [address] [place], [place] [address] Phone # [phone] Fax [phone]

## 2024-03-13 ENCOUNTER — Encounter (INDEPENDENT_AMBULATORY_CARE_PROVIDER_SITE_OTHER): Payer: Self-pay | Admitting: Family Medicine

## 2024-03-13 ENCOUNTER — Ambulatory Visit (INDEPENDENT_AMBULATORY_CARE_PROVIDER_SITE_OTHER): Admitting: Family Medicine

## 2024-03-13 VITALS — BP 97/62 | HR 77 | Temp 97.8°F | Ht 63.0 in | Wt 180.0 lb

## 2024-03-13 DIAGNOSIS — K5901 Slow transit constipation: Secondary | ICD-10-CM

## 2024-03-13 DIAGNOSIS — F5089 Other specified eating disorder: Secondary | ICD-10-CM | POA: Diagnosis not present

## 2024-03-13 DIAGNOSIS — I959 Hypotension, unspecified: Secondary | ICD-10-CM

## 2024-03-13 DIAGNOSIS — E66812 Obesity, class 2: Secondary | ICD-10-CM

## 2024-03-13 DIAGNOSIS — E1140 Type 2 diabetes mellitus with diabetic neuropathy, unspecified: Secondary | ICD-10-CM

## 2024-03-13 DIAGNOSIS — K5909 Other constipation: Secondary | ICD-10-CM | POA: Diagnosis not present

## 2024-03-13 DIAGNOSIS — Z6831 Body mass index (BMI) 31.0-31.9, adult: Secondary | ICD-10-CM | POA: Diagnosis not present

## 2024-03-13 DIAGNOSIS — Z7985 Long-term (current) use of injectable non-insulin antidiabetic drugs: Secondary | ICD-10-CM

## 2024-03-13 DIAGNOSIS — F3289 Other specified depressive episodes: Secondary | ICD-10-CM

## 2024-03-13 DIAGNOSIS — E785 Hyperlipidemia, unspecified: Secondary | ICD-10-CM

## 2024-03-13 DIAGNOSIS — E119 Type 2 diabetes mellitus without complications: Secondary | ICD-10-CM

## 2024-03-13 DIAGNOSIS — E669 Obesity, unspecified: Secondary | ICD-10-CM | POA: Diagnosis not present

## 2024-03-13 DIAGNOSIS — E782 Mixed hyperlipidemia: Secondary | ICD-10-CM

## 2024-03-13 DIAGNOSIS — J3089 Other allergic rhinitis: Secondary | ICD-10-CM | POA: Diagnosis not present

## 2024-03-13 MED ORDER — FLUTICASONE PROPIONATE 50 MCG/ACT NA SUSP: 2.0000 | Freq: Every day | NASAL | 0 refills | Status: DC | PRN

## 2024-03-13 MED ORDER — ROSUVASTATIN CALCIUM 20 MG PO TABS: 20.0000 mg | ORAL_TABLET | Freq: Every day | ORAL | 0 refills | Status: DC

## 2024-03-13 MED ORDER — POLYETHYLENE GLYCOL 3350 17 GM/SCOOP PO POWD: ORAL | 1 refills | Status: DC

## 2024-03-13 MED ORDER — ESCITALOPRAM OXALATE 20 MG PO TABS: 20.0000 mg | ORAL_TABLET | Freq: Every day | ORAL | 0 refills | Status: DC

## 2024-03-13 MED ORDER — TIRZEPATIDE 7.5 MG/0.5ML ~~LOC~~ SOAJ: 7.5000 mg | SUBCUTANEOUS | 0 refills | Status: DC

## 2024-03-13 NOTE — Progress Notes (Signed)
 Office: [phone]  /  Fax: [phone]  WEIGHT SUMMARY AND BIOMETRICS  Anthropometric Measurements Height: 5\' 3"  (1.6 m) (rechecked height today) Weight: 180 lb (81.6 kg) BMI (Calculated): 31.89 Weight at Last Visit: 187 lb Weight Lost Since Last Visit: 7 lb Weight Gained Since Last Visit: 0 Starting Weight: 219 lb Total Weight Loss (lbs): 39 lb (17.7 kg) Peak Weight: 222 lb   Body Composition  Body Fat %: 46.4 % Fat Mass (lbs): 83.6 lbs Muscle Mass (lbs): 91.6 lbs Total Body Water  (lbs): 68 lbs Visceral Fat Rating : 14   Other Clinical Data Fasting: no Labs: no Today's Visit #: 35 Starting Date: 04/22/21 Comments: rechecked height today    Chief Complaint: OBESITY   Discussed the use of AI scribe software for clinical note transcription with the patient, who gave verbal consent to proceed.  History of Present Illness Lauren [name] "Lauren [name]" is a 78 year old female who presents for a follow-up on her obesity treatment plan and progress assessment.  She is focused on managing her obesity through dietary changes and increased physical activity. She aims to maintain her calorie intake between 1200 and 1300 calories and consume at least 80 grams of protein daily, achieving this about 50% of the time. She has increased her physical activity by walking more and attending physical therapy sessions twice a week for 60 minutes each. Since her last visit approximately a month ago, she has lost seven pounds.  She experiences fatigue, which she attributes to worsening restless leg syndrome and insufficient protein intake. Her current protein intake is around 30 grams per day, below her target. She plans to see a specialist later this month for her restless leg syndrome.  She has stress incontinence and overactive bladder, for which she was prescribed a medication that is expected to take some time to show effects. She recently attended a dental appointment without needing  to rush to the restroom, which she sees as a positive sign. She is scheduled to start pelvic floor therapy in early June.  She experiences occasional lightheadedness and dizziness, noting that her blood pressure was very low recently, with the diastolic reading in the upper fifties. She had previously stopped taking chlorthalidone  after surgery due to low blood pressure and resumed it when her blood pressure stabilized. She is monitoring her blood pressure at home.  She is managing constipation, which she attributes to her medications, including Mounjaro  and Myrbetriq . She currently takes fiber gummies but finds them insufficient. She has used Miralax  in the past and is working on increasing her water  intake.  She mentions her mood, noting that while she is not depressed, she sometimes lacks motivation to be active. She is currently taking Lexapro  and feels it has been beneficial.      PHYSICAL EXAM:  Blood pressure 97/62, pulse 77, temperature 97.8 F (36.6 C), height 5\' 3"  (1.6 m), weight 180 lb (81.6 kg), SpO2 97%. Body mass index is 31.89 kg/m.  DIAGNOSTIC DATA REVIEWED:  BMET    Component Value Date/Time   NA 130 (L) 12/07/2023 0736   NA 132 (L) 06/08/2023 0941   K 3.4 (L) 12/07/2023 0736   CL 95 (L) 12/07/2023 0736   CO2 25 12/07/2023 0736   GLUCOSE 118 (H) 12/07/2023 0736   BUN 15 12/07/2023 0736   BUN 14 06/08/2023 0941   CREATININE 0.97 12/07/2023 0736   CREATININE 0.87 06/08/2022 1439   CALCIUM  8.7 (L) 12/07/2023 0736   GFRNONAA 60 (L) 12/07/2023 2130  GFRNONAA >60 06/08/2022 1439   GFRAA >60 02/04/2020 1212   GFRAA >60 11/09/2018 1405   Lab Results  Component Value Date   HGBA1C 6.0 (H) 06/08/2023   HGBA1C 6.7 (H) 11/16/2016   Lab Results  Component Value Date   INSULIN  25.6 (H) 06/08/2023   INSULIN  22.3 04/22/2021   Lab Results  Component Value Date   TSH 2.150 05/11/2022   CBC    Component Value Date/Time   WBC 10.9 (H) 11/30/2023 1347   RBC 4.25  11/30/2023 1347   HGB 12.4 11/30/2023 1347   HGB 12.3 06/08/2022 1439   HGB 11.8 05/11/2022 0954   HCT 37.5 11/30/2023 1347   HCT 35.1 05/11/2022 0954   PLT 327 11/30/2023 1347   PLT 272 06/08/2022 1439   PLT 265 05/11/2022 0954   MCV 88.2 11/30/2023 1347   MCV 87 05/11/2022 0954   MCH 29.2 11/30/2023 1347   MCHC 33.1 11/30/2023 1347   RDW 13.3 11/30/2023 1347   RDW 13.2 05/11/2022 0954   Iron Studies No results found for: "IRON", "TIBC", "FERRITIN", "IRONPCTSAT" Lipid Panel     Component Value Date/Time   CHOL 123 06/08/2023 0941   TRIG 75 06/08/2023 0941   HDL 67 06/08/2023 0941   LDLCALC 41 06/08/2023 0941   Hepatic Function Panel     Component Value Date/Time   PROT 6.7 06/08/2023 0941   ALBUMIN  4.3 06/08/2023 0941   AST 19 06/08/2023 0941   AST 16 06/08/2022 1439   ALT 16 06/08/2023 0941   ALT 12 06/08/2022 1439   ALKPHOS 58 06/08/2023 0941   BILITOT 0.4 06/08/2023 0941   BILITOT 0.4 06/08/2022 1439   BILIDIR <0.1 (L) 07/30/2015 1338   IBILI NOT CALCULATED 07/30/2015 1338      Component Value Date/Time   TSH 2.150 05/11/2022 0954   Nutritional Lab Results  Component Value Date   VD25OH 57.5 02/07/2024   VD25OH 124.0 (H) 09/22/2023   VD25OH 100.0 06/08/2023     Assessment and Plan Assessment & Plan Obesity She is actively pursuing weight loss through dietary changes and increased physical activity, resulting in a seven-pound reduction since her last visit. Her caloric intake is 1200-1300 calories with a target of 80 grams of protein daily, achieved 50% of the time. Fatigue is likely due to insufficient protein intake and restless leg syndrome. She participates in physical therapy twice weekly and walks more frequently. - Continue current diet and exercise regimen. - Encourage increased protein intake, especially in the morning, with options like [place] bacon, [place] bacon, or [place] yogurt. - Consider adding milk to breakfast to increase protein  intake. - Continue physical therapy and walking.  Type 2 diabetes mellitus She manages her diabetes with Mounjaro  and lifestyle modifications, with no gastrointestinal side effects reported. - Continue Mounjaro . - Continue current diet and exercise regimen.  Hypotension She experiences episodes of low blood pressure, particularly post-weight loss, with dizziness and lightheadedness. She is not taking chlorthalidone  regularly, and dehydration may contribute to hypotension. - Discontinue chlorthalidone . - Monitor blood pressure at home two to three times per week and record readings. - Increase hydration gradually.  Constipation Constipation is likely exacerbated by Mounjaro  and Myrbetriq , with current fiber intake possibly insufficient to counteract medication effects. - Start Miralax  at half dose daily and take it regularly to prevent constipation. - Increase water  intake gradually.  Hyperlipidemia She is on Crestor  for cholesterol management without issues. - Continue Crestor . - Maintain a low cholesterol diet and continue weight  loss efforts. - Continue current diet and exercise regimen.  Allergic rhinitis She experiences significant allergy symptoms likely due to environmental allergens and uses Flonase  and Zyrtec. - Continue Flonase . - Use Zyrtec as needed for allergy symptoms.  Emotional Eating Behaviors She is on Lexapro  and feels it is helping, but still  feels tired and struggles with lack of motivation at times -Refill Lexapro  and she will think about adding another medication and we will discuss further at next visit next month.     I have personally spent 45 minutes total time today in preparation, patient care, and documentation for this visit, including the following: review of clinical lab tests; review of medical history, and on nutritional counseling and education.   She was informed of the importance of frequent follow up visits to maximize her success with  intensive lifestyle modifications for her multiple health conditions.    Jasmine Mesi, MD

## 2024-03-14 ENCOUNTER — Encounter (INDEPENDENT_AMBULATORY_CARE_PROVIDER_SITE_OTHER): Payer: Self-pay | Admitting: Family Medicine

## 2024-03-19 ENCOUNTER — Encounter (HOSPITAL_BASED_OUTPATIENT_CLINIC_OR_DEPARTMENT_OTHER): Payer: Self-pay | Admitting: Physical Therapy

## 2024-03-19 ENCOUNTER — Ambulatory Visit (HOSPITAL_BASED_OUTPATIENT_CLINIC_OR_DEPARTMENT_OTHER): Attending: Orthopedic Surgery | Admitting: Physical Therapy

## 2024-03-19 DIAGNOSIS — R262 Difficulty in walking, not elsewhere classified: Secondary | ICD-10-CM | POA: Diagnosis not present

## 2024-03-19 DIAGNOSIS — M25552 Pain in left hip: Secondary | ICD-10-CM

## 2024-03-19 DIAGNOSIS — M6281 Muscle weakness (generalized): Secondary | ICD-10-CM

## 2024-03-19 DIAGNOSIS — M25551 Pain in right hip: Secondary | ICD-10-CM

## 2024-03-19 NOTE — Therapy (Signed)
 OUTPATIENT PHYSICAL THERAPY TREATMENT  Patient Name: Lauren [name] MRN: [id number] DOB:[birth year], 78 y.o., female Today's Date: 03/19/2024  END OF SESSION:  PT End of Session - 03/19/24 0904     Visit Number 11    Date for PT Re-Evaluation 05/11/24    Authorization Type [employer] $10 copay    Progress Note Due on Visit 20    PT Start Time 0845    PT Stop Time 0925    PT Time Calculation (min) 40 min    Activity Tolerance Patient tolerated treatment well    Behavior During Therapy [facility] for tasks assessed/performed             Past Medical History:  Diagnosis Date   Anxiety    Arthritis    Back, Hip- right   Atypical chest pain 06/25/2021   Back pain    Cancer (HCC)    right breast   Cataract    Colitis    Coronary artery calcification 02/20/2020   Diabetes mellitus without complication (HCC)    Type II   Edema 03/05/2021   Edema of both lower extremities    Endometriosis    Family history of adverse reaction to anesthesia    Father - N/V - blood pressure; daughter N/V   Family history of breast cancer    Family history of colon cancer    Family history of kidney cancer    Family history of prostate cancer    Fatty liver    GERD (gastroesophageal reflux disease)    History of hiatal hernia    History of kidney stones    History of ulcerative colitis    Hypertension    Insomnia    Joint pain    Kidney problem    Lower extremity edema 03/05/2021   Lumbar disc disease    L4 L5   Neuropathy    Osteoarthritis    Other hyperlipidemia    Palpitations    Personal history of radiation therapy    Pneumonia    1983, 2003   Pure hypercholesterolemia 02/20/2020   PVCs (premature ventricular contractions)    benign   Umbilical hernia    Vitamin D  deficiency    Wears glasses    Past Surgical History:  Procedure Laterality Date   APPENDECTOMY     BREAST BIOPSY  08-13-2009  DR TSUEI   EXCISION LEFT NIPPLE DUCT   BREAST EXCISIONAL BIOPSY Left    x2    BREAST EXCISIONAL BIOPSY Right    BREAST LUMPECTOMY Right 11/16/2018   invasive ductal   CHOLECYSTECTOMY  1992   COLON RESECTION  1986   ULCERATIVE COLITIS   COLON SURGERY     ESOPHAGEAL MANOMETRY N/A 06/11/2013   Procedure: ESOPHAGEAL MANOMETRY (EM);  Surgeon: Mathew Solomon, MD;  Location: [facility];  Service: Endoscopy;  Laterality: N/A;   ESOPHAGOGASTRODUODENOSCOPY (EGD) WITH PROPOFOL  N/A 05/31/2016   Procedure: ESOPHAGOGASTRODUODENOSCOPY (EGD) WITH PROPOFOL ;  Surgeon: Ozell Blunt, MD;  Location: [facility] ENDOSCOPY;  Service: Endoscopy;  Laterality: N/A;   EXCISION RIGHT BREAST MASS   10-20-2005  DR Donata Fryer YOUNG   EYE SURGERY Bilateral 2023   cataract   FRACTURE SURGERY     left arm   kidney stone removed  12/2009   KNEE ARTHROSCOPY Left    MCL   LEFT THUMB CARPOMETACARPAL JOINT SUSPENSIONPLASTY  04-06-2006  DR Donzella Galley   LEFT WRIST ARTHROSCOPY W/ DEBRIDEMENT AND REMOVAL CYST  03-26-2004  DR Donzella Galley   MYOMECTOMY     RADIOACTIVE  SEED GUIDED PARTIAL MASTECTOMY WITH AXILLARY SENTINEL LYMPH NODE BIOPSY Right 11/16/2018   Procedure: RIGHT BREAST RADIOACTIVE SEED GUIDED PARTIAL MASTECTOMY WITH  SENTINEL  NODE BIOPSY;  Surgeon: Oza Blumenthal, MD;  Location: [facility] OR;  Service: General;  Laterality: Right;   RIGHT COLECTOMY     RIGHT URETEROSCOPIC STONE EXTRACTION  12-11-2009  DR Autry [facility]   TENDON RELEASE     TONSILLECTOMY     TOTAL HIP ARTHROPLASTY Right 11/26/2016   Procedure: RIGHT TOTAL HIP ARTHROPLASTY ANTERIOR APPROACH;  Surgeon: Arnie Lao, MD;  Location: [facility] ORS;  Service: Orthopedics;  Laterality: Right;   TOTAL HIP ARTHROPLASTY Left 12/06/2023   Procedure: LEFT TOTAL HIP ARTHROPLASTY ANTERIOR APPROACH;  Surgeon: Arnie Lao, MD;  Location: [facility] OR;  Service: Orthopedics;  Laterality: Left;   TOTAL KNEE ARTHROPLASTY Left 12/02/2017   Procedure: LEFT TOTAL KNEE ARTHROPLASTY;  Surgeon: Arnie Lao, MD;  Location: [facility];  Service: Orthopedics;   Laterality: Left;   TOTAL KNEE ARTHROPLASTY Right 08/03/2022   Procedure: RIGHT TOTAL KNEE ARTHROPLASTY;  Surgeon: Arnie Lao, MD;  Location: [facility] OR;  Service: Orthopedics;  Laterality: Right;   WRIST SURGERY     both   Patient Active Problem List   Diagnosis Date Noted   Overactive bladder 02/22/2024   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/2023   SUI (stress urinary incontinence, female) 11/22/2023   Restless leg syndrome 09/22/2023   Other hyperlipidemia 06/08/2023   Unilateral primary osteoarthritis, left hip 05/11/2023   Depression 03/16/2023   Allergic rhinitis 12/29/2022   Seasonal affective disorder (HCC) 12/01/2022   BMI 32.0-32.9,adult 12/01/2022   Obesity, Beginning BMI 37.59 12/01/2022   OA (osteoarthritis) of knee 08/03/2022   Status post total right knee replacement 08/03/2022   Stress 07/19/2022   Other constipation 07/19/2022   Atypical chest pain 06/25/2021   Class 2 severe obesity with serious comorbidity and body mass index (BMI) of 37.0 to 37.9 in adult (HCC) 06/03/2021   Vitamin D  deficiency 06/03/2021   Other fatigue 04/22/2021   SOB (shortness of breath) on exertion 04/22/2021   Lower extremity edema 03/05/2021   Coronary artery calcification 02/20/2020   Pure hypercholesterolemia 02/20/2020   Unilateral primary osteoarthritis, right knee 01/03/2019   Genetic testing 12/07/2018   Family history of breast cancer    Family history of prostate cancer    Family history of colon cancer    Family history of kidney cancer    Malignant neoplasm of upper-outer quadrant of right breast in female, 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/09/2018   Diabetic neuropathy, type II diabetes mellitus (HCC) 11/09/2018   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/02/2017   Trochanteric bursitis, right hip 05/10/2017   Status post total replacement of right hip 11/26/2016   DDD (degenerative disc disease), 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   Low back pain 04/23/2015    Hiatal hernia 04/13/2013   Diabetes mellitus (HCC) 01/26/2013   Umbilical hernia 07/24/2012   Essential hypertension     PCP: Jimmey Mould, MD   REFERRING PROVIDER: Shauna Del, DO   REFERRING DIAG:  W09.81 (ICD-10-CM) - Proximal limb muscle weakness  M25.551,M25.552 (ICD-10-CM) - Bilateral hip pain  Z96.642 (ICD-10-CM) - Status post total replacement of left hip  Z96.641 (ICD-10-CM) - Status post total replacement of right hip    THERAPY DIAG:  No diagnosis found.  Rationale for Evaluation and Treatment: Rehabilitation  ONSET DATE: Chronic pain , Patient 7wks s/p L THA 12/06/2023  SUBJECTIVE:   SUBJECTIVE  STATEMENT: Pt reports that she has switched cars (it was difficult getting in/out of her SUV).  Pt reports she knows how to swim, but hasn't been in pool in a while. She noticed her balance gets worse the longer she walks.   POOL ACCESS: pt is member of [employer]   PERTINENT HISTORY: R THA, L TKA, anxiety, DM  PAIN:  Are you in pain? No NPRS scale:  Pain location:  Pain description:  Aggravating factors: being on feet for a while Relieving factors: ice, rest, pain meds  PRECAUTIONS: Anterior hip  WEIGHT BEARING RESTRICTIONS: No  FALLS:  Has patient fallen in last 6 months? No  LIVING ENVIRONMENT: Lives with: lives alone, 4 mo old basset hound Lives in: House/apartment Stairs: Yes: Internal: 1 steps; going from den to [employer] Has following equipment at home: Single point cane  OCCUPATION: retired- Chiropodist  PLOF: Independent  PATIENT GOALS: to be able to be in less pain/tolerate things better  Next MD visit: Dr. Vaughn Georges in June 2025   OBJECTIVE:   DIAGNOSTIC FINDINGS:  Left hip arthroplasty in expected alignment. No periprosthetic lucency or fracture. Recent postsurgical change includes air and edema in the soft tissues. Lateral skin staples in place. A right hip arthroplasty.   IMPRESSION: Left hip arthroplasty without immediate  postoperative complication.    PATIENT SURVEYS:  Patient-Specific Activity Scoring Scheme  "0" represents "unable to perform." "10" represents "able to perform at prior level. 0 1 2 3 4 5 6 7 8 9  10 (Date and Score)   Activity Eval  03/07/2024   1. Stairs 5  6  2. Getting in/out cars 4  8  3. Getting in/out bed 4 10  4.    5.    Score 4.33/10 8/10   Total score = sum of the activity scores/number of activities Minimum detectable change (90%CI) for average score = 2 points Minimum detectable change (90%CI) for single activity score = 3 points  COGNITION: Overall cognitive status: WFL    SENSATION: WFL  EDEMA:  Not present  MUSCLE LENGTH: Not tested  POSTURE:  No Significant postural limitations  PALPATION: Tender to Lt hip flexor tendon and anteriomedial quadricep, Rt hip flexor tendon and medial quadricep  LOWER EXTREMITY ROM:   ROM Right eval Left eval Left 02/29/24  Hip flexion 100 80 88  Hip extension     Hip abduction     Hip adduction     Hip internal rotation     Hip external rotation     Knee flexion     Knee extension     Ankle dorsiflexion     Ankle plantarflexion     Ankle inversion     Ankle eversion      (Blank rows = not tested)  LOWER EXTREMITY MMT:  MMT Right eval Left eval Right 02/14/24 Left 02/14/24 Left 02/29/24  Hip flexion 3+ 3- 4 4- 4  Hip extension       Hip abduction       Hip adduction       Hip internal rotation       Hip external rotation       Knee flexion 4 4-; painful 4+ 4+ 5  Knee extension 3+; painful 3+; painful 4+ 4+ 5  Ankle dorsiflexion 5 5     Ankle plantarflexion       Ankle inversion       Ankle eversion        (Blank rows = not  tested)  LOWER EXTREMITY SPECIAL TESTS:  Hip: none Back: (+) SLR bilateral   FUNCTIONAL TESTS:  01/23/24 18 inch chair transfer: is not able to rise to standing without use of UE on armrest and rocking for momentum TUG: 16.78 with use of UE on armrest  02/29/24 Can not  perform sit to stand without UE support now TUG 10.84 seconds  03/07/2024 5 times sit to stand:  15.57 sec Timed Up and Go (TUG):  9.87 sec 6 minute walk test:  954 ft with RPE of 7/10 (age related norms 1,200 ft Modified CTSIB:  condition 1- 30 sec, condition 2- 30 sec (with sway noted), condition 3-  30 sec, condition 4- 30 sec (with increased sway noted).  Total 120/120  GAIT: Distance walked: clinical distance Assistive device utilized: None Level of assistance: SBA Comments: antalgic. Decreased Lt LE stance time, narrow step width, decreased step length, decreased speed                                                                                                                                                                        TODAY'S TREATMENT [facility] Adult PT Treatment:                                            03/19/24 Pt seen for aquatic therapy today.  Treatment took place in water  3.5-4.75 ft in depth at the [facility] pool. Temp of water  was 91.  Pt entered/exited the pool via stairs independent in step-to pattern  with bilat rail.  - Intro to aquatic therapy principles - unsupported-> UE on rainbow hand floats walking forward/ backward - UE on rainbow hand floats-> arm addct/ abdct with side stepping  - UE on wall:  heel raises x 10; hip add/abd x 10 ; hip flexion /extension x10; relaxed squats x10 - straddling noodle with UE support: cycling   Pt requires the buoyancy and hydrostatic pressure of water  for support, and to offload joints by unweighting joint load by at least 50 % in navel deep water  and by at least 75-80% in chest to neck deep water .  Viscosity of the water  is needed for resistance of strengthening. Water  current perturbations provides challenge to standing balance requiring increased core activation.     03/07/2024: Nustep level 5 x6 min with PT present to discuss status PSFS average is 8/10 5 times sit to stand and TUG 6 minute walk test:   954 ft with RPE of 7/10 Modified CTSIB with close SBA when eyes closed Seated with 6# dumbbell on thigh performing him flexion/abduction over small cone.  2x10 bilat Marching  holding 6# dumbbell x10 bilat (Farmer's carry high march) Education on Aquatics PT   02/29/24 TherAct Standing marching 2x10 reps blat L3 band Standing abduction 2x10 reps with L3 band bil; UE support needed Standing hip extension green band 2X10 bilat Step ups lateral 6 inch step X 10 bilat with UE support Forward step ups 6 inch step X 10 bilat with one UE support Seated marching over cone to simulate picking leg up to get in and out of car, 3 sets of 5 over and back Tandem walk in bars without UE support to improve balance for walking in straight line 5 round trips in bars Tandem balance 30 sec X 2 bilat  Therex Recumbent bike L1 X 5 min   02/27/24 TherAct Standing marching 2x10 reps blat L3 band Standing abduction 2x10 reps with L3 band bil; UE support needed Standing hip extension green band 2X10 bilat Step ups lateral 6 inch step X 10 bilat with UE support Forward step ups 6 inch step X 10 bilat with one UE support Seated marching over cone to simulate picking leg up to get in and out of car, 3 sets of 5 over and back Tandem walk in bars without UE support to improve balance for walking in straight line 5 round trips in bars Tandem balance 30 sec X 2 bilat Walking over hurdles in bars without UE support, 3 round trips, down with right leg first, back with left leg first  Therex Nu step L6 X 8 min      PATIENT EDUCATION:  Education details: intro to aquatic therapy Person educated: Patient Education method: Programmer, multimedia, Demonstration, Verbal cues,  Education comprehension: verbalized understanding, returned demonstration, and verbal cues required  HOME EXERCISE PROGRAM: [place] Access Code: [email or web address] URL: [email or web address] Date: 01/25/2024  -02/14/24: added seated march over  object, standing march over object, and tandem walking  to help with getting in/out of car as well as balance for walking in straight line reducing staggering to right or left during ambulation. She denies needing picture of these.  ASSESSMENT:  CLINICAL IMPRESSION: Pt demonstrates safety and independence in aquatic setting with therapist instructing from deck. Pt demonstrates confidence in setting, moving throughout all depths easily.  Pt is directed through various movement patterns and trials in standing position. She demonstrated decreased balance in R SLS during exercise and in R stance phase of gait in water .   Goals are ongoing.  Patient would benefit from continued skilled PT  to progress towards goal related activities.    OBJECTIVE IMPAIRMENTS: Abnormal gait, decreased activity tolerance, decreased balance, decreased endurance, decreased mobility, difficulty walking, decreased ROM, decreased strength, increased edema, impaired flexibility, and pain.   ACTIVITY LIMITATIONS: carrying, lifting, bending, sitting, standing, squatting, stairs, and locomotion level  PARTICIPATION LIMITATIONS: meal prep, cleaning, laundry, driving, shopping, and community activity  PERSONAL FACTORS: Age, Fitness, Time since onset of injury/illness/exacerbation, and 3+ comorbidities: see above are also affecting patient's functional outcome.   REHAB POTENTIAL: Good  CLINICAL DECISION MAKING: Stable/uncomplicated  EVALUATION COMPLEXITY: Low   GOALS: Goals reviewed with patient? Yes  SHORT TERM GOALS: (target date for Short term goals are 3 weeks 02/13/2024)   1.  Patient will demonstrate independent use of home exercise program to maintain progress from in clinic treatments.  Goal status: MET 02/07/24  LONG TERM GOALS: (target dates for all long term goals are 8 weeks  05/11/2024 )   1. Patient will demonstrate/report pain at worst less than or equal to 3/10 to facilitate minimal  limitation in daily  activity secondary to pain symptoms. Goal status: some MET 02/29/24   2. Patient will improve PSFS to > or equal to 6.5/10 to show improved function. Goal status: Met on 03/07/2024    3.  Patient will demonstrate BLE MMT 4+/5 throughout to faciltiate usual transfers, stairs, squatting at [facility] for daily life.  Goal status: MET 02/29/24   4.  Patient will demonstrate/report ability to perform 18 inch chair transfer s UE assist.  Goal status: MET 02/29/24   5.  Patient will demonstrate TUG </=13.5sec in effort to reduce risk of falls Goal status:MET  02/29/24   6.  Patient will demonstrate up and down a flight of stairs with single hand rail with reciprocal gait pattern Goal Status: ongoing 02/29/24  7.  Patient will increase her 6 minute walk test to at least 1,200 ft with no loss of balance and no increased lateral sway.  Baseline:  954 ft with RPE of 7/10  Goal Status:  NEW on 03/07/24   PLAN:  PT FREQUENCY: 1-2x/week  PT DURATION: 8 weeks  PLANNED INTERVENTIONS: Can include 40981- PT Re-evaluation, 97110-Therapeutic exercises, 97530- Therapeutic activity, 97112- Neuromuscular re-education, 97535- Self Care, 97140- Manual therapy, [phone]- Gait training, [phone]- Orthotic Fit/training, [phone]- Canalith repositioning, [id number]- Aquatic Therapy, [phone]- Electrical stimulation (unattended), [phone]- Electrical stimulation (manual), [id number] Physical performance testing, 97016- Vasopneumatic device, [id number]- Ultrasound, [id number]- Traction (mechanical), [id number]- Ionotophoresis 4mg /ml Dexamethasone , Patient/Family education, Balance training, Stair training, Taping, Dry Needling, Joint mobilization, Joint manipulation, Spinal manipulation, Spinal mobilization, Scar mobilization, Vestibular training, Visual/preceptual remediation/compensation, DME instructions, Cryotherapy, and Moist heat.  All performed as medically necessary.  All included unless contraindicated  Plan for next visit:  Assess and progress HEP as  indicated, Aquatics, strengthening, flexibility, manual/dry needling as indicated    Almedia Jacobsen, PTA 03/19/24 12:57 PM [facility] [address] [place], [place], [address] Phone: [phone]   Fax:  [phone]

## 2024-03-21 ENCOUNTER — Encounter (HOSPITAL_BASED_OUTPATIENT_CLINIC_OR_DEPARTMENT_OTHER): Payer: Self-pay | Admitting: Physical Therapy

## 2024-03-21 ENCOUNTER — Ambulatory Visit (HOSPITAL_BASED_OUTPATIENT_CLINIC_OR_DEPARTMENT_OTHER): Admitting: Physical Therapy

## 2024-03-21 DIAGNOSIS — M25552 Pain in left hip: Secondary | ICD-10-CM

## 2024-03-21 DIAGNOSIS — R262 Difficulty in walking, not elsewhere classified: Secondary | ICD-10-CM

## 2024-03-21 DIAGNOSIS — M6281 Muscle weakness (generalized): Secondary | ICD-10-CM

## 2024-03-21 DIAGNOSIS — M25551 Pain in right hip: Secondary | ICD-10-CM

## 2024-03-21 NOTE — Therapy (Signed)
 OUTPATIENT PHYSICAL THERAPY TREATMENT  Patient Name: Lauren [name] MRN: [id number] DOB:April 18, 1946, 78 y.o., female Today's Date: 03/21/2024  END OF SESSION:  PT End of Session - 03/21/24 0855     Visit Number 12    Number of Visits 16    Date for PT Re-Evaluation 05/11/24    Authorization Type [employer] $10 copay    Progress Note Due on Visit 20    PT Start Time 0847    PT Stop Time 0945    PT Time Calculation (min) 58 min    Activity Tolerance Patient tolerated treatment well    Behavior During Therapy [facility] for tasks assessed/performed             Past Medical History:  Diagnosis Date   Anxiety    Arthritis    Back, Hip- right   Atypical chest pain 06/25/2021   Back pain    Cancer (HCC)    right breast   Cataract    Colitis    Coronary artery calcification 02/20/2020   Diabetes mellitus without complication (HCC)    Type II   Edema 03/05/2021   Edema of both lower extremities    Endometriosis    Family history of adverse reaction to anesthesia    Father - N/V - blood pressure; daughter N/V   Family history of breast cancer    Family history of colon cancer    Family history of kidney cancer    Family history of prostate cancer    Fatty liver    GERD (gastroesophageal reflux disease)    History of hiatal hernia    History of kidney stones    History of ulcerative colitis    Hypertension    Insomnia    Joint pain    Kidney problem    Lower extremity edema 03/05/2021   Lumbar disc disease    L4 L5   Neuropathy    Osteoarthritis    Other hyperlipidemia    Palpitations    Personal history of radiation therapy    Pneumonia    1983, 2003   Pure hypercholesterolemia 02/20/2020   PVCs (premature ventricular contractions)    benign   Umbilical hernia    Vitamin D  deficiency    Wears glasses    Past Surgical History:  Procedure Laterality Date   APPENDECTOMY     BREAST BIOPSY  08-13-2009  DR TSUEI   EXCISION LEFT NIPPLE DUCT   BREAST  EXCISIONAL BIOPSY Left    x2   BREAST EXCISIONAL BIOPSY Right    BREAST LUMPECTOMY Right 11/16/2018   invasive ductal   CHOLECYSTECTOMY  1992   COLON RESECTION  1986   ULCERATIVE COLITIS   COLON SURGERY     ESOPHAGEAL MANOMETRY N/A 06/11/2013   Procedure: ESOPHAGEAL MANOMETRY (EM);  Surgeon: Mathew Solomon, MD;  Location: [facility];  Service: Endoscopy;  Laterality: N/A;   ESOPHAGOGASTRODUODENOSCOPY (EGD) WITH PROPOFOL  N/A 05/31/2016   Procedure: ESOPHAGOGASTRODUODENOSCOPY (EGD) WITH PROPOFOL ;  Surgeon: Ozell Blunt, MD;  Location: [facility] ENDOSCOPY;  Service: Endoscopy;  Laterality: N/A;   EXCISION RIGHT BREAST MASS   10-20-2005  DR Donata Fryer YOUNG   EYE SURGERY Bilateral 2023   cataract   FRACTURE SURGERY     left arm   kidney stone removed  12/2009   KNEE ARTHROSCOPY Left    MCL   LEFT THUMB CARPOMETACARPAL JOINT SUSPENSIONPLASTY  04-06-2006  DR Donzella Galley   LEFT WRIST ARTHROSCOPY W/ DEBRIDEMENT AND REMOVAL CYST  03-26-2004  DR Donzella Galley  MYOMECTOMY     RADIOACTIVE SEED GUIDED PARTIAL MASTECTOMY WITH AXILLARY SENTINEL LYMPH NODE BIOPSY Right 11/16/2018   Procedure: RIGHT BREAST RADIOACTIVE SEED GUIDED PARTIAL MASTECTOMY WITH  SENTINEL  NODE BIOPSY;  Surgeon: Oza Blumenthal, MD;  Location: [facility] OR;  Service: General;  Laterality: Right;   RIGHT COLECTOMY     RIGHT URETEROSCOPIC STONE EXTRACTION  12-11-2009  DR Autry Legions [facility]   TENDON RELEASE     TONSILLECTOMY     TOTAL HIP ARTHROPLASTY Right 11/26/2016   Procedure: RIGHT TOTAL HIP ARTHROPLASTY ANTERIOR APPROACH;  Surgeon: Arnie Lao, MD;  Location: [facility] ORS;  Service: Orthopedics;  Laterality: Right;   TOTAL HIP ARTHROPLASTY Left 12/06/2023   Procedure: LEFT TOTAL HIP ARTHROPLASTY ANTERIOR APPROACH;  Surgeon: Arnie Lao, MD;  Location: [facility] OR;  Service: Orthopedics;  Laterality: Left;   TOTAL KNEE ARTHROPLASTY Left 12/02/2017   Procedure: LEFT TOTAL KNEE ARTHROPLASTY;  Surgeon: Arnie Lao, MD;  Location: [facility];   Service: Orthopedics;  Laterality: Left;   TOTAL KNEE ARTHROPLASTY Right 08/03/2022   Procedure: RIGHT TOTAL KNEE ARTHROPLASTY;  Surgeon: Arnie Lao, MD;  Location: [facility] OR;  Service: Orthopedics;  Laterality: Right;   WRIST SURGERY     both   Patient Active Problem List   Diagnosis Date Noted   Overactive bladder 02/22/2024   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/2023   SUI (stress urinary incontinence, female) 11/22/2023   Restless leg syndrome 09/22/2023   Other hyperlipidemia 06/08/2023   Unilateral primary osteoarthritis, left hip 05/11/2023   Depression 03/16/2023   Allergic rhinitis 12/29/2022   Seasonal affective disorder (HCC) 12/01/2022   BMI 32.0-32.9,adult 12/01/2022   Obesity, Beginning BMI 37.59 12/01/2022   OA (osteoarthritis) of knee 08/03/2022   Status post total right knee replacement 08/03/2022   Stress 07/19/2022   Other constipation 07/19/2022   Atypical chest pain 06/25/2021   Class 2 severe obesity with serious comorbidity and body mass index (BMI) of 37.0 to 37.9 in adult (HCC) 06/03/2021   Vitamin D  deficiency 06/03/2021   Other fatigue 04/22/2021   SOB (shortness of breath) on exertion 04/22/2021   Lower extremity edema 03/05/2021   Coronary artery calcification 02/20/2020   Pure hypercholesterolemia 02/20/2020   Unilateral primary osteoarthritis, right knee 01/03/2019   Genetic testing 12/07/2018   Family history of breast cancer    Family history of prostate cancer    Family history of colon cancer    Family history of kidney cancer    Malignant neoplasm of upper-outer quadrant of right breast in female, 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/09/2018   Diabetic neuropathy, type II diabetes mellitus (HCC) 11/09/2018   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/02/2017   Trochanteric bursitis, right hip 05/10/2017   Status post total replacement of right hip 11/26/2016   DDD (degenerative disc disease), 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    Low back pain 04/23/2015   Hiatal hernia 04/13/2013   Diabetes mellitus (HCC) 01/26/2013   Umbilical hernia 07/24/2012   Essential hypertension     PCP: Jimmey Mould, MD   REFERRING PROVIDER: Shauna Del, DO   REFERRING DIAG:  Z61.09 (ICD-10-CM) - Proximal limb muscle weakness  M25.551,M25.552 (ICD-10-CM) - Bilateral hip pain  Z96.642 (ICD-10-CM) - Status post total replacement of left hip  Z96.641 (ICD-10-CM) - Status post total replacement of right hip    THERAPY DIAG:  Pain in left hip  Pain in right hip  Muscle weakness (generalized)  Difficulty in walking, not elsewhere classified  Rationale for Evaluation and Treatment: Rehabilitation  ONSET DATE: Chronic pain , Patient 7wks s/p L THA 12/06/2023  SUBJECTIVE:   SUBJECTIVE STATEMENT: "I was really sore". (Points to bilat front of hips)   POOL ACCESS: pt is member of [employer]   PERTINENT HISTORY: R THA, L TKA, anxiety, DM  PAIN:  Are you in pain? yes NPRS scale: 3/10   Pain location: bilat ant hip  Pain description: sore Aggravating factors: being on feet for a while Relieving factors: ice, rest, pain meds  PRECAUTIONS: Anterior hip  WEIGHT BEARING RESTRICTIONS: No  FALLS:  Has patient fallen in last 6 months? No  LIVING ENVIRONMENT: Lives with: lives alone, 4 mo old basset hound Lives in: House/apartment Stairs: Yes: Internal: 1 steps; going from den to [employer] Has following equipment at home: Single point cane  OCCUPATION: retired- Chiropodist  PLOF: Independent  PATIENT GOALS: to be able to be in less pain/tolerate things better  Next MD visit: Dr. Vaughn Georges in June 2025   OBJECTIVE:   DIAGNOSTIC FINDINGS:  Left hip arthroplasty in expected alignment. No periprosthetic lucency or fracture. Recent postsurgical change includes air and edema in the soft tissues. Lateral skin staples in place. A right hip arthroplasty.   IMPRESSION: Left hip arthroplasty without immediate  postoperative complication.    PATIENT SURVEYS:  Patient-Specific Activity Scoring Scheme  "0" represents "unable to perform." "10" represents "able to perform at prior level. 0 1 2 3 4 5 6 7 8 9  10 (Date and Score)   Activity Eval  03/07/2024   1. Stairs 5  6  2. Getting in/out cars 4  8  3. Getting in/out bed 4 10  4.    5.    Score 4.33/10 8/10   Total score = sum of the activity scores/number of activities Minimum detectable change (90%CI) for average score = 2 points Minimum detectable change (90%CI) for single activity score = 3 points  COGNITION: Overall cognitive status: WFL    SENSATION: WFL  EDEMA:  Not present  MUSCLE LENGTH: Not tested  POSTURE:  No Significant postural limitations  PALPATION: Tender to Lt hip flexor tendon and anteriomedial quadricep, Rt hip flexor tendon and medial quadricep  LOWER EXTREMITY ROM:   ROM Right eval Left eval Left 02/29/24  Hip flexion 100 80 88  Hip extension     Hip abduction     Hip adduction     Hip internal rotation     Hip external rotation     Knee flexion     Knee extension     Ankle dorsiflexion     Ankle plantarflexion     Ankle inversion     Ankle eversion      (Blank rows = not tested)  LOWER EXTREMITY MMT:  MMT Right eval Left eval Right 02/14/24 Left 02/14/24 Left 02/29/24  Hip flexion 3+ 3- 4 4- 4  Hip extension       Hip abduction       Hip adduction       Hip internal rotation       Hip external rotation       Knee flexion 4 4-; painful 4+ 4+ 5  Knee extension 3+; painful 3+; painful 4+ 4+ 5  Ankle dorsiflexion 5 5     Ankle plantarflexion       Ankle inversion       Ankle eversion        (Blank rows = not tested)  LOWER  EXTREMITY SPECIAL TESTS:  Hip: none Back: (+) SLR bilateral   FUNCTIONAL TESTS:  01/23/24 18 inch chair transfer: is not able to rise to standing without use of UE on armrest and rocking for momentum TUG: 16.78 with use of UE on armrest  02/29/24 Can not  perform sit to stand without UE support now TUG 10.84 seconds  03/07/2024 5 times sit to stand:  15.57 sec Timed Up and Go (TUG):  9.87 sec 6 minute walk test:  954 ft with RPE of 7/10 (age related norms 1,200 ft Modified CTSIB:  condition 1- 30 sec, condition 2- 30 sec (with sway noted), condition 3-  30 sec, condition 4- 30 sec (with increased sway noted).  Total 120/120  GAIT: Distance walked: clinical distance Assistive device utilized: None Level of assistance: SBA Comments: antalgic. Decreased Lt LE stance time, narrow step width, decreased step length, decreased speed                                                                                                                                                                        TODAY'S TREATMENT [facility] Adult PT Treatment:                                            03/21/24 Pt seen for aquatic therapy today.  Treatment took place in water  3.5-4.75 ft in depth at the [facility] pool. Temp of water  was 91.  Pt entered/exited the pool via stairs independent in step-to pattern  with bilat rail.  - unsupported walking forward/ backward multiple laps, cues for even step length - UE on rainbow hand floats-> arm addct/ abdct with side stepping  - UE on wall:  heel raises x 10; hip add/abd x 10 ; hip flexion /extension x10; relaxed squats x10 - straddling noodle with UE support on wall: cycling, suspended jumping jacks and cross country ski - repeated circuts  After dried off:  IASTM to R ant/lateral mid-prox quad to decrease fascial restrictions.  I strip of sensitive skin tape applied to R ant hip incision in zig zag pattern to assist with scar mobilization and desensitization.  Pt given instructions on rationale and safe removal technique; pt verbalized understanding.  [facility] Adult PT Treatment:                                            03/19/24 Pt seen for aquatic therapy today.  Treatment took place in water  3.5-4.75 ft in  depth at the [facility]  pool. Temp of water  was 91.  Pt entered/exited the pool via stairs independent in step-to pattern  with bilat rail.  - Intro to aquatic therapy principles - unsupported-> UE on rainbow hand floats walking forward/ backward - UE on rainbow hand floats-> arm addct/ abdct with side stepping  - UE on wall:  heel raises x 10; hip add/abd x 10 ; hip flexion /extension x10; relaxed squats x10 - straddling noodle with UE support: cycling   Pt requires the buoyancy and hydrostatic pressure of water  for support, and to offload joints by unweighting joint load by at least 50 % in navel deep water  and by at least 75-80% in chest to neck deep water .  Viscosity of the water  is needed for resistance of strengthening. Water  current perturbations provides challenge to standing balance requiring increased core activation.     03/07/2024: Nustep level 5 x6 min with PT present to discuss status PSFS average is 8/10 5 times sit to stand and TUG 6 minute walk test:  954 ft with RPE of 7/10 Modified CTSIB with close SBA when eyes closed Seated with 6# dumbbell on thigh performing him flexion/abduction over small cone.  2x10 bilat Marching holding 6# dumbbell x10 bilat (Farmer's carry high march) Education on Aquatics PT   02/29/24 TherAct Standing marching 2x10 reps blat L3 band Standing abduction 2x10 reps with L3 band bil; UE support needed Standing hip extension green band 2X10 bilat Step ups lateral 6 inch step X 10 bilat with UE support Forward step ups 6 inch step X 10 bilat with one UE support Seated marching over cone to simulate picking leg up to get in and out of car, 3 sets of 5 over and back Tandem walk in bars without UE support to improve balance for walking in straight line 5 round trips in bars Tandem balance 30 sec X 2 bilat  Therex Recumbent bike L1 X 5 min   02/27/24 TherAct Standing marching 2x10 reps blat L3 band Standing abduction 2x10 reps with  L3 band bil; UE support needed Standing hip extension green band 2X10 bilat Step ups lateral 6 inch step X 10 bilat with UE support Forward step ups 6 inch step X 10 bilat with one UE support Seated marching over cone to simulate picking leg up to get in and out of car, 3 sets of 5 over and back Tandem walk in bars without UE support to improve balance for walking in straight line 5 round trips in bars Tandem balance 30 sec X 2 bilat Walking over hurdles in bars without UE support, 3 round trips, down with right leg first, back with left leg first  Therex Nu step L6 X 8 min      PATIENT EDUCATION:  Education details: intro to aquatic therapy Person educated: Patient Education method: Programmer, multimedia, Demonstration, Verbal cues,  Education comprehension: verbalized understanding, returned demonstration, and verbal cues required  HOME EXERCISE PROGRAM: [place] Access Code: [email or web address] URL: [email or web address] Date: 01/25/2024  -02/14/24: added seated march over object, standing march over object, and tandem walking  to help with getting in/out of car as well as balance for walking in straight line reducing staggering to right or left during ambulation. She denies needing picture of these.  ASSESSMENT:  CLINICAL IMPRESSION: Pt continues with tightness in R ant/lateral hip.  She demonstrated decreased balance in R SLS during exercise and in R stance phase of gait in water . She requires cues for more even step length with RLE (takes small steps);  improved after R hip flexor stretch. She requires UE support on wall when suspended on noodle as she is not able to gain balance away from wall.  Will assess trial of IASTM and sensitive skin Rock tape to R ant hip; will plan to reapply if helpful.  Goals are ongoing.  Patient would benefit from continued skilled PT  to progress towards goal related activities.    OBJECTIVE IMPAIRMENTS: Abnormal gait, decreased activity tolerance,  decreased balance, decreased endurance, decreased mobility, difficulty walking, decreased ROM, decreased strength, increased edema, impaired flexibility, and pain.   ACTIVITY LIMITATIONS: carrying, lifting, bending, sitting, standing, squatting, stairs, and locomotion level  PARTICIPATION LIMITATIONS: meal prep, cleaning, laundry, driving, shopping, and community activity  PERSONAL FACTORS: Age, Fitness, Time since onset of injury/illness/exacerbation, and 3+ comorbidities: see above are also affecting patient's functional outcome.   REHAB POTENTIAL: Good  CLINICAL DECISION MAKING: Stable/uncomplicated  EVALUATION COMPLEXITY: Low   GOALS: Goals reviewed with patient? Yes  SHORT TERM GOALS: (target date for Short term goals are 3 weeks 02/13/2024)   1.  Patient will demonstrate independent use of home exercise program to maintain progress from in clinic treatments.  Goal status: MET 02/07/24  LONG TERM GOALS: (target dates for all long term goals are 8 weeks  05/11/2024 )   1. Patient will demonstrate/report pain at worst less than or equal to 3/10 to facilitate minimal limitation in daily activity secondary to pain symptoms. Goal status: some MET 02/29/24   2. Patient will improve PSFS to > or equal to 6.5/10 to show improved function. Goal status: Met on 03/07/2024    3.  Patient will demonstrate BLE MMT 4+/5 throughout to faciltiate usual transfers, stairs, squatting at [facility] for daily life.  Goal status: MET 02/29/24   4.  Patient will demonstrate/report ability to perform 18 inch chair transfer s UE assist.  Goal status: MET 02/29/24   5.  Patient will demonstrate TUG </=13.5sec in effort to reduce risk of falls Goal status:MET  02/29/24   6.  Patient will demonstrate up and down a flight of stairs with single hand rail with reciprocal gait pattern Goal Status: ongoing 02/29/24  7.  Patient will increase her 6 minute walk test to at least 1,200 ft with no loss of balance and no  increased lateral sway.  Baseline:  954 ft with RPE of 7/10  Goal Status:  NEW on 03/07/24   PLAN:  PT FREQUENCY: 1-2x/week  PT DURATION: 8 weeks  PLANNED INTERVENTIONS: Can include 16109- PT Re-evaluation, 97110-Therapeutic exercises, 97530- Therapeutic activity, 97112- Neuromuscular re-education, 97535- Self Care, 97140- Manual therapy, [phone]- Gait training, [phone]- Orthotic Fit/training, [phone]- Canalith repositioning, [id number]- Aquatic Therapy, [phone]- Electrical stimulation (unattended), [phone]- Electrical stimulation (manual), [id number] Physical performance testing, 97016- Vasopneumatic device, [id number]- Ultrasound, [id number]- Traction (mechanical), [id number]- Ionotophoresis 4mg /ml Dexamethasone , Patient/Family education, Balance training, Stair training, Taping, Dry Needling, Joint mobilization, Joint manipulation, Spinal manipulation, Spinal mobilization, Scar mobilization, Vestibular training, Visual/preceptual remediation/compensation, DME instructions, Cryotherapy, and Moist heat.  All performed as medically necessary.  All included unless contraindicated  Plan for next visit:  Assess and progress HEP as indicated, Aquatics, strengthening, flexibility, manual/dry needling as indicated    Almedia Jacobsen, PTA 03/21/24 12:16 PM [facility] [address] [place], [place], [address] Phone: [phone]   Fax:  [phone]

## 2024-03-27 DIAGNOSIS — M25552 Pain in left hip: Secondary | ICD-10-CM | POA: Diagnosis not present

## 2024-03-27 DIAGNOSIS — M47817 Spondylosis without myelopathy or radiculopathy, lumbosacral region: Secondary | ICD-10-CM | POA: Diagnosis not present

## 2024-03-27 DIAGNOSIS — M150 Primary generalized (osteo)arthritis: Secondary | ICD-10-CM | POA: Diagnosis not present

## 2024-03-27 DIAGNOSIS — M25551 Pain in right hip: Secondary | ICD-10-CM | POA: Diagnosis not present

## 2024-03-28 ENCOUNTER — Encounter (HOSPITAL_BASED_OUTPATIENT_CLINIC_OR_DEPARTMENT_OTHER): Payer: Self-pay | Admitting: Physical Therapy

## 2024-03-28 ENCOUNTER — Ambulatory Visit (HOSPITAL_BASED_OUTPATIENT_CLINIC_OR_DEPARTMENT_OTHER): Admitting: Physical Therapy

## 2024-03-28 DIAGNOSIS — R262 Difficulty in walking, not elsewhere classified: Secondary | ICD-10-CM

## 2024-03-28 DIAGNOSIS — M25552 Pain in left hip: Secondary | ICD-10-CM | POA: Diagnosis not present

## 2024-03-28 DIAGNOSIS — M25551 Pain in right hip: Secondary | ICD-10-CM

## 2024-03-28 DIAGNOSIS — M6281 Muscle weakness (generalized): Secondary | ICD-10-CM

## 2024-03-28 NOTE — Therapy (Signed)
 OUTPATIENT PHYSICAL THERAPY TREATMENT  Patient Name: Lauren [name] MRN: [id number] DOB:[birth year], 78 y.o., female Today's Date: 03/28/2024  END OF SESSION:  PT End of Session - 03/28/24 1106     Visit Number 13    Number of Visits 16    Date for PT Re-Evaluation 05/11/24    Authorization Type [employer] $10 copay    Progress Note Due on Visit 20    PT Start Time 1100    PT Stop Time 1140    PT Time Calculation (min) 40 min    Activity Tolerance Patient tolerated treatment well    Behavior During Therapy [facility] for tasks assessed/performed             Past Medical History:  Diagnosis Date   Anxiety    Arthritis    Back, Hip- right   Atypical chest pain 06/25/2021   Back pain    Cancer (HCC)    right breast   Cataract    Colitis    Coronary artery calcification 02/20/2020   Diabetes mellitus without complication (HCC)    Type II   Edema 03/05/2021   Edema of both lower extremities    Endometriosis    Family history of adverse reaction to anesthesia    Father - N/V - blood pressure; daughter N/V   Family history of breast cancer    Family history of colon cancer    Family history of kidney cancer    Family history of prostate cancer    Fatty liver    GERD (gastroesophageal reflux disease)    History of hiatal hernia    History of kidney stones    History of ulcerative colitis    Hypertension    Insomnia    Joint pain    Kidney problem    Lower extremity edema 03/05/2021   Lumbar disc disease    L4 L5   Neuropathy    Osteoarthritis    Other hyperlipidemia    Palpitations    Personal history of radiation therapy    Pneumonia    1983, 2003   Pure hypercholesterolemia 02/20/2020   PVCs (premature ventricular contractions)    benign   Umbilical hernia    Vitamin D  deficiency    Wears glasses    Past Surgical History:  Procedure Laterality Date   APPENDECTOMY     BREAST BIOPSY  08-13-2009  DR TSUEI   EXCISION LEFT NIPPLE DUCT   BREAST  EXCISIONAL BIOPSY Left    x2   BREAST EXCISIONAL BIOPSY Right    BREAST LUMPECTOMY Right 11/16/2018   invasive ductal   CHOLECYSTECTOMY  1992   COLON RESECTION  1986   ULCERATIVE COLITIS   COLON SURGERY     ESOPHAGEAL MANOMETRY N/A 06/11/2013   Procedure: ESOPHAGEAL MANOMETRY (EM);  Surgeon: Mathew Solomon, MD;  Location: [facility] ENDOSCOPY;  Service: Endoscopy;  Laterality: N/A;   ESOPHAGOGASTRODUODENOSCOPY (EGD) WITH PROPOFOL  N/A 05/31/2016   Procedure: ESOPHAGOGASTRODUODENOSCOPY (EGD) WITH PROPOFOL ;  Surgeon: Ozell Blunt, MD;  Location: [facility] ENDOSCOPY;  Service: Endoscopy;  Laterality: N/A;   EXCISION RIGHT BREAST MASS   10-20-2005  DR Donata Fryer YOUNG   EYE SURGERY Bilateral 2023   cataract   FRACTURE SURGERY     left arm   kidney stone removed  12/2009   KNEE ARTHROSCOPY Left    MCL   LEFT THUMB CARPOMETACARPAL JOINT SUSPENSIONPLASTY  04-06-2006  DR Donzella Galley   LEFT WRIST ARTHROSCOPY W/ DEBRIDEMENT AND REMOVAL CYST  03-26-2004  DR Donzella Galley  MYOMECTOMY     RADIOACTIVE SEED GUIDED PARTIAL MASTECTOMY WITH AXILLARY SENTINEL LYMPH NODE BIOPSY Right 11/16/2018   Procedure: RIGHT BREAST RADIOACTIVE SEED GUIDED PARTIAL MASTECTOMY WITH  SENTINEL  NODE BIOPSY;  Surgeon: Oza Blumenthal, MD;  Location: [facility] OR;  Service: General;  Laterality: Right;   RIGHT COLECTOMY     RIGHT URETEROSCOPIC STONE EXTRACTION  12-11-2009  DR Autry [facility]   TENDON RELEASE     TONSILLECTOMY     TOTAL HIP ARTHROPLASTY Right 11/26/2016   Procedure: RIGHT TOTAL HIP ARTHROPLASTY ANTERIOR APPROACH;  Surgeon: Arnie Lao, MD;  Location: [facility] ORS;  Service: Orthopedics;  Laterality: Right;   TOTAL HIP ARTHROPLASTY Left 12/06/2023   Procedure: LEFT TOTAL HIP ARTHROPLASTY ANTERIOR APPROACH;  Surgeon: Arnie Lao, MD;  Location: [facility] OR;  Service: Orthopedics;  Laterality: Left;   TOTAL KNEE ARTHROPLASTY Left 12/02/2017   Procedure: LEFT TOTAL KNEE ARTHROPLASTY;  Surgeon: Arnie Lao, MD;  Location: [facility] ORS;   Service: Orthopedics;  Laterality: Left;   TOTAL KNEE ARTHROPLASTY Right 08/03/2022   Procedure: RIGHT TOTAL KNEE ARTHROPLASTY;  Surgeon: Arnie Lao, MD;  Location: [facility] OR;  Service: Orthopedics;  Laterality: Right;   WRIST SURGERY     both   Patient Active Problem List   Diagnosis Date Noted   Overactive bladder 02/22/2024   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/2023   SUI (stress urinary incontinence, female) 11/22/2023   Restless leg syndrome 09/22/2023   Other hyperlipidemia 06/08/2023   Unilateral primary osteoarthritis, left hip 05/11/2023   Depression 03/16/2023   Allergic rhinitis 12/29/2022   Seasonal affective disorder (HCC) 12/01/2022   BMI 32.0-32.9,adult 12/01/2022   Obesity, Beginning BMI 37.59 12/01/2022   OA (osteoarthritis) of knee 08/03/2022   Status post total right knee replacement 08/03/2022   Stress 07/19/2022   Other constipation 07/19/2022   Atypical chest pain 06/25/2021   Class 2 severe obesity with serious comorbidity and body mass index (BMI) of 37.0 to 37.9 in adult (HCC) 06/03/2021   Vitamin D  deficiency 06/03/2021   Other fatigue 04/22/2021   SOB (shortness of breath) on exertion 04/22/2021   Lower extremity edema 03/05/2021   Coronary artery calcification 02/20/2020   Pure hypercholesterolemia 02/20/2020   Unilateral primary osteoarthritis, right knee 01/03/2019   Genetic testing 12/07/2018   Family history of breast cancer    Family history of prostate cancer    Family history of colon cancer    Family history of kidney cancer    Malignant neoplasm of upper-outer quadrant of right breast in female, 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/09/2018   Diabetic neuropathy, type II diabetes mellitus (HCC) 11/09/2018   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/02/2017   Trochanteric bursitis, right hip 05/10/2017   Status post total replacement of right hip 11/26/2016   DDD (degenerative disc disease), 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    Low back pain 04/23/2015   Hiatal hernia 04/13/2013   Diabetes mellitus (HCC) 01/26/2013   Umbilical hernia 07/24/2012   Essential hypertension     PCP: Jimmey Mould, MD   REFERRING PROVIDER: Shauna Del, DO   REFERRING DIAG:  [phone] (ICD-10-CM) - Proximal limb muscle weakness  M25.551,M25.552 (ICD-10-CM) - Bilateral hip pain  Z96.642 (ICD-10-CM) - Status post total replacement of left hip  Z96.641 (ICD-10-CM) - Status post total replacement of right hip    THERAPY DIAG:  Pain in left hip  Pain in right hip  Muscle weakness (generalized)  Difficulty in walking, not elsewhere classified  Rationale for Evaluation and Treatment: Rehabilitation  ONSET DATE: Chronic pain , Patient 7wks s/p L THA 12/06/2023  SUBJECTIVE:   SUBJECTIVE STATEMENT: Pt reports that the tape on front of R hip was helpful.  She was sore in front of hips, but it has settled down.  She reports concern for her balance. She states she has been working on her stairs with reciprocal pattern and it is getting easier.   POOL ACCESS: pt is member of [employer]   PERTINENT HISTORY: R THA, L TKA, anxiety, DM  PAIN:  Are you in pain? yes NPRS scale: 3/10   Pain location: lower back  Pain description: sore Aggravating factors: being on feet for a while Relieving factors: ice, rest, pain meds  PRECAUTIONS: Anterior hip  WEIGHT BEARING RESTRICTIONS: No  FALLS:  Has patient fallen in last 6 months? No  LIVING ENVIRONMENT: Lives with: lives alone, 4 mo old basset hound Lives in: House/apartment Stairs: Yes: Internal: 1 steps; going from den to [employer] Has following equipment at home: Single point cane  OCCUPATION: retired- Chiropodist  PLOF: Independent  PATIENT GOALS: to be able to be in less pain/tolerate things better  Next MD visit: Dr. Vaughn Georges in June 2025   OBJECTIVE:   DIAGNOSTIC FINDINGS:  Left hip arthroplasty in expected alignment. No periprosthetic lucency or fracture.  Recent postsurgical change includes air and edema in the soft tissues. Lateral skin staples in place. A right hip arthroplasty.   IMPRESSION: Left hip arthroplasty without immediate postoperative complication.    PATIENT SURVEYS:  Patient-Specific Activity Scoring Scheme  "0" represents "unable to perform." "10" represents "able to perform at prior level. 0 1 2 3 4 5 6 7 8 9  10 (Date and Score)   Activity Eval  03/07/2024   1. Stairs 5  6  2. Getting in/out cars 4  8  3. Getting in/out bed 4 10  4.    5.    Score 4.33/10 8/10   Total score = sum of the activity scores/number of activities Minimum detectable change (90%CI) for average score = 2 points Minimum detectable change (90%CI) for single activity score = 3 points  COGNITION: Overall cognitive status: WFL    SENSATION: WFL  EDEMA:  Not present  MUSCLE LENGTH: Not tested  POSTURE:  No Significant postural limitations  PALPATION: Tender to Lt hip flexor tendon and anteriomedial quadricep, Rt hip flexor tendon and medial quadricep  LOWER EXTREMITY ROM:   ROM Right eval Left eval Left 02/29/24  Hip flexion 100 80 88  Hip extension     Hip abduction     Hip adduction     Hip internal rotation     Hip external rotation     Knee flexion     Knee extension     Ankle dorsiflexion     Ankle plantarflexion     Ankle inversion     Ankle eversion      (Blank rows = not tested)  LOWER EXTREMITY MMT:  MMT Right eval Left eval Right 02/14/24 Left 02/14/24 Left 02/29/24  Hip flexion 3+ 3- 4 4- 4  Hip extension       Hip abduction       Hip adduction       Hip internal rotation       Hip external rotation       Knee flexion 4 4-; painful 4+ 4+ 5  Knee extension 3+; painful 3+; painful 4+ 4+ 5  Ankle dorsiflexion  5 5     Ankle plantarflexion       Ankle inversion       Ankle eversion        (Blank rows = not tested)  LOWER EXTREMITY SPECIAL TESTS:  Hip: none Back: (+) SLR bilateral   FUNCTIONAL  TESTS:  01/23/24 18 inch chair transfer: is not able to rise to standing without use of UE on armrest and rocking for momentum TUG: 16.78 with use of UE on armrest  02/29/24 Can not perform sit to stand without UE support now TUG 10.84 seconds  03/07/2024 5 times sit to stand:  15.57 sec Timed Up and Go (TUG):  9.87 sec 6 minute walk test:  954 ft with RPE of 7/10 (age related norms 1,200 ft Modified CTSIB:  condition 1- 30 sec, condition 2- 30 sec (with sway noted), condition 3-  30 sec, condition 4- 30 sec (with increased sway noted).  Total 120/120  GAIT: Distance walked: clinical distance Assistive device utilized: None Level of assistance: SBA Comments: antalgic. Decreased Lt LE stance time, narrow step width, decreased step length, decreased speed                                                                                                                                                                        TODAY'S TREATMENT [facility] Adult PT Treatment:                                            03/28/24 Pt seen for aquatic therapy today.  Treatment took place in water  3.5-4.75 ft in depth at the [facility] pool. Temp of water  was 91.  Pt entered/exited the pool via stairs independent in step-to pattern  with bilat rail.  - unsupported walking forward/ backward multiple laps, cues for even step length - UE on rainbow hand floats-> arm addct/ abdct with side stepping -> side marching with UE on surface - UE on wall:  hip add/abd, crossing midline x 10 ; hip flexion /extension x10; - no UE support: trial of SLS  - sit to stand on 3rd step in water  with UE support -> no UE support x 6  After dried off:  IASTM to R ant/lateral  quad to decrease fascial restrictions.  I strip of sensitive skin tape applied to R ant hip incision in zig zag pattern to assist with scar mobilization and desensitization.  Pt reminded of instructions of safe removal technique; pt verbalized  understanding.  [facility] Adult PT Treatment:  03/21/24 Pt seen for aquatic therapy today.  Treatment took place in water  3.5-4.75 ft in depth at the [facility] pool. Temp of water  was 91.  Pt entered/exited the pool via stairs independent in step-to pattern  with bilat rail.  - unsupported walking forward/ backward multiple laps, cues for even step length - UE on rainbow hand floats-> arm addct/ abdct with side stepping  - UE on wall:  heel raises x 10; hip add/abd x 10 ; hip flexion /extension x10; relaxed squats x10 - straddling noodle with UE support on wall: cycling, suspended jumping jacks and cross country ski - repeated circuts  After dried off:  IASTM to R ant/lateral mid-prox quad to decrease fascial restrictions.  I strip of sensitive skin tape applied to R ant hip incision in zig zag pattern to assist with scar mobilization and desensitization.  Pt given instructions on rationale and safe removal technique; pt verbalized understanding.  [facility] Adult PT Treatment:                                            03/19/24 Pt seen for aquatic therapy today.  Treatment took place in water  3.5-4.75 ft in depth at the [facility] pool. Temp of water  was 91.  Pt entered/exited the pool via stairs independent in step-to pattern  with bilat rail.  - Intro to aquatic therapy principles - unsupported-> UE on rainbow hand floats walking forward/ backward - UE on rainbow hand floats-> arm addct/ abdct with side stepping  - UE on wall:  heel raises x 10; hip add/abd x 10 ; hip flexion /extension x10; relaxed squats x10 - straddling noodle with UE support: cycling   Pt requires the buoyancy and hydrostatic pressure of water  for support, and to offload joints by unweighting joint load by at least 50 % in navel deep water  and by at least 75-80% in chest to neck deep water .  Viscosity of the water  is needed for resistance of strengthening. Water  current  perturbations provides challenge to standing balance requiring increased core activation.     03/07/2024: Nustep level 5 x6 min with PT present to discuss status PSFS average is 8/10 5 times sit to stand and TUG 6 minute walk test:  954 ft with RPE of 7/10 Modified CTSIB with close SBA when eyes closed Seated with 6# dumbbell on thigh performing him flexion/abduction over small cone.  2x10 bilat Marching holding 6# dumbbell x10 bilat (Farmer's carry high march) Education on Aquatics PT   02/29/24 TherAct Standing marching 2x10 reps blat L3 band Standing abduction 2x10 reps with L3 band bil; UE support needed Standing hip extension green band 2X10 bilat Step ups lateral 6 inch step X 10 bilat with UE support Forward step ups 6 inch step X 10 bilat with one UE support Seated marching over cone to simulate picking leg up to get in and out of car, 3 sets of 5 over and back Tandem walk in bars without UE support to improve balance for walking in straight line 5 round trips in bars Tandem balance 30 sec X 2 bilat  Therex Recumbent bike L1 X 5 min   02/27/24 TherAct Standing marching 2x10 reps blat L3 band Standing abduction 2x10 reps with L3 band bil; UE support needed Standing hip extension green band 2X10 bilat Step ups lateral 6 inch step X 10 bilat with UE  support Forward step ups 6 inch step X 10 bilat with one UE support Seated marching over cone to simulate picking leg up to get in and out of car, 3 sets of 5 over and back Tandem walk in bars without UE support to improve balance for walking in straight line 5 round trips in bars Tandem balance 30 sec X 2 bilat Walking over hurdles in bars without UE support, 3 round trips, down with right leg first, back with left leg first  Therex Nu step L6 X 8 min      PATIENT EDUCATION:  Education details: intro to aquatic therapy Person educated: Patient Education method: Programmer, multimedia, Demonstration, Verbal cues,  Education  comprehension: verbalized understanding, returned demonstration, and verbal cues required  HOME EXERCISE PROGRAM: [place] Access Code: [email or web address] URL: [email or web address] Date: 01/25/2024  -02/14/24: added seated march over object, standing march over object, and tandem walking  to help with getting in/out of car as well as balance for walking in straight line reducing staggering to right or left during ambulation. She denies needing picture of these.  ASSESSMENT:  CLINICAL IMPRESSION: Pt reported some increased in R hip pain with side stepping today.  Demonstrates decreased balance in both R and L when in SLS.  Gradual improvement in step length with gait in water .  Goals are ongoing. Will plan to work on stairs next visit.  Patient would benefit from continued skilled PT  to progress towards goal related activities.    OBJECTIVE IMPAIRMENTS: Abnormal gait, decreased activity tolerance, decreased balance, decreased endurance, decreased mobility, difficulty walking, decreased ROM, decreased strength, increased edema, impaired flexibility, and pain.   ACTIVITY LIMITATIONS: carrying, lifting, bending, sitting, standing, squatting, stairs, and locomotion level  PARTICIPATION LIMITATIONS: meal prep, cleaning, laundry, driving, shopping, and community activity  PERSONAL FACTORS: Age, Fitness, Time since onset of injury/illness/exacerbation, and 3+ comorbidities: see above are also affecting patient's functional outcome.   REHAB POTENTIAL: Good  CLINICAL DECISION MAKING: Stable/uncomplicated  EVALUATION COMPLEXITY: Low   GOALS: Goals reviewed with patient? Yes  SHORT TERM GOALS: (target date for Short term goals are 3 weeks 02/13/2024)   1.  Patient will demonstrate independent use of home exercise program to maintain progress from in clinic treatments.  Goal status: MET 02/07/24  LONG TERM GOALS: (target dates for all long term goals are 8 weeks  05/11/2024 )   1. Patient  will demonstrate/report pain at worst less than or equal to 3/10 to facilitate minimal limitation in daily activity secondary to pain symptoms. Goal status: some MET 02/29/24   2. Patient will improve PSFS to > or equal to 6.5/10 to show improved function. Goal status: Met on 03/07/2024    3.  Patient will demonstrate BLE MMT 4+/5 throughout to faciltiate usual transfers, stairs, squatting at [facility] for daily life.  Goal status: MET 02/29/24   4.  Patient will demonstrate/report ability to perform 18 inch chair transfer s UE assist.  Goal status: MET 02/29/24   5.  Patient will demonstrate TUG </=13.5sec in effort to reduce risk of falls Goal status:MET  02/29/24   6.  Patient will demonstrate up and down a flight of stairs with single hand rail with reciprocal gait pattern Goal Status: ongoing 02/29/24  7.  Patient will increase her 6 minute walk test to at least 1,200 ft with no loss of balance and no increased lateral sway.  Baseline:  954 ft with RPE of 7/10  Goal Status:  NEW on 03/07/24   PLAN:  PT FREQUENCY: 1-2x/week  PT DURATION: 8 weeks  PLANNED INTERVENTIONS: Can include 97146- PT Re-evaluation, 97110-Therapeutic exercises, 97530- Therapeutic activity, 97112- Neuromuscular re-education, 97535- Self Care, 97140- Manual therapy, [phone]- Gait training, [phone]- Orthotic Fit/training, [phone]- Canalith repositioning, [id number]- Aquatic Therapy, [phone]- Electrical stimulation (unattended), [phone]- Electrical stimulation (manual), [id number] Physical performance testing, 97016- Vasopneumatic device, [id number]- Ultrasound, [id number]- Traction (mechanical), [id number]- Ionotophoresis 4mg /ml Dexamethasone , Patient/Family education, Balance training, Stair training, Taping, Dry Needling, Joint mobilization, Joint manipulation, Spinal manipulation, Spinal mobilization, Scar mobilization, Vestibular training, Visual/preceptual remediation/compensation, DME instructions, Cryotherapy, and Moist heat.  All performed as medically  necessary.  All included unless contraindicated  Plan for next visit:  Assess and progress HEP as indicated, Aquatics, strengthening, flexibility, manual/dry needling as indicated   Almedia Jacobsen, PTA 03/28/24 12:53 PM [facility] [address] [place], [place], [address] Phone: [phone]   Fax:  [phone]

## 2024-03-30 ENCOUNTER — Ambulatory Visit (HOSPITAL_BASED_OUTPATIENT_CLINIC_OR_DEPARTMENT_OTHER): Admitting: Physical Therapy

## 2024-03-30 ENCOUNTER — Encounter (HOSPITAL_BASED_OUTPATIENT_CLINIC_OR_DEPARTMENT_OTHER): Payer: Self-pay

## 2024-03-30 ENCOUNTER — Ambulatory Visit: Admitting: Obstetrics and Gynecology

## 2024-03-30 ENCOUNTER — Encounter: Payer: Self-pay | Admitting: Obstetrics and Gynecology

## 2024-03-30 VITALS — BP 127/61 | HR 67

## 2024-03-30 DIAGNOSIS — N3281 Overactive bladder: Secondary | ICD-10-CM

## 2024-03-30 DIAGNOSIS — N393 Stress incontinence (female) (male): Secondary | ICD-10-CM | POA: Diagnosis not present

## 2024-03-30 MED ORDER — MIRABEGRON ER 50 MG PO TB24: 50.0000 mg | ORAL_TABLET | Freq: Every day | ORAL | 3 refills | Status: DC

## 2024-03-30 NOTE — Progress Notes (Signed)
  Urogynecology Return Visit  SUBJECTIVE  History of Present Illness: Lauren [name] is a 78 y.o. female seen in follow-up for mixed incontinence. Plan at last visit was to start Myrbetriq  25mg . She was also referred to pelvic PT.   Since starting the medication, having less accidents. No accidents with urgency on the way to the bathroom, and less urgency overall. Still waking up 2 times a night. Still has occasional leakage with bending, lifting, moving, etc. She has pelvic PT scheduled for the end of June.    Past Medical History: Patient  has a past medical history of Anxiety, Arthritis, Atypical chest pain (06/25/2021), Back pain, Cancer (HCC), Cataract, Colitis, Coronary artery calcification (02/20/2020), Diabetes mellitus without complication (HCC), Edema (03/05/2021), Edema of both lower extremities, Endometriosis, Family history of adverse reaction to anesthesia, Family history of breast cancer, Family history of colon cancer, Family history of kidney cancer, Family history of prostate cancer, Fatty liver, GERD (gastroesophageal reflux disease), History of hiatal hernia, History of kidney stones, History of ulcerative colitis, Hypertension, Insomnia, Joint pain, Kidney problem, Lower extremity edema (03/05/2021), Lumbar disc disease, Neuropathy, Osteoarthritis, Other hyperlipidemia, Palpitations, Personal history of radiation therapy, Pneumonia, Pure hypercholesterolemia (02/20/2020), PVCs (premature ventricular contractions), Umbilical hernia, Vitamin D  deficiency, and Wears glasses.   Past Surgical History: She  has a past surgical history that includes Colon surgery; Wrist surgery; Myomectomy; kidney stone removed (12/2009); Right colectomy; Tendon release; RIGHT URETEROSCOPIC STONE EXTRACTION (12-11-2009  DR Homero Luster); LEFT THUMB CARPOMETACARPAL JOINT SUSPENSIONPLASTY (04-06-2006  DR Donzella Galley); EXCISION RIGHT BREAST MASS  (10-20-2005  DR Aldo Amble); LEFT WRIST ARTHROSCOPY  W/ DEBRIDEMENT AND REMOVAL CYST (03-26-2004  DR Donzella Galley); Cholecystectomy (1992); Breast biopsy (08-13-2009  DR TSUEI); Colon resection (1986); Esophageal manometry (N/A, 06/11/2013); Knee arthroscopy (Left); Esophagogastroduodenoscopy (egd) with propofol  (N/A, 05/31/2016); Total hip arthroplasty (Right, 11/26/2016); Total knee arthroplasty (Left, 12/02/2017); Breast excisional biopsy (Left); Breast excisional biopsy (Right); Tonsillectomy; Appendectomy; Fracture surgery; Radioactive seed guided mastectomy with axillary sentinel lymph node biopsy (Right, 11/16/2018); Breast lumpectomy (Right, 11/16/2018); Eye surgery (Bilateral, 2023); Total knee arthroplasty (Right, 08/03/2022); and Total hip arthroplasty (Left, 12/06/2023).   Medications: She has a current medication list which includes the following prescription(s): aspirin  ec, b-complex with vitamin c, escitalopram , fiber, fluticasone , gabapentin , hydrocodone -acetaminophen , onetouch delica plus lancet33g, magnesium , methocarbamol , mirabegron  er, onetouch verio, pantoprazole , phenylephrine , polyethylene glycol powder, rosuvastatin , tirzepatide , and UNABLE TO FIND.   Allergies: Patient is allergic to ciprofloxacin hcl, percocet [oxycodone -acetaminophen ], penicillins, sulfa antibiotics, tape, tetanus toxoids, and jardiance [empagliflozin].   Social History: Patient  reports that she quit smoking about 41 years ago. Her smoking use included cigarettes. She started smoking about 51 years ago. She has a 10 pack-year smoking history. She quit smokeless tobacco use about 42 years ago. She reports that she does not drink alcohol and does not use drugs.     OBJECTIVE     Physical Exam: Vitals:   03/30/24 1403  BP: 127/61  Pulse: 67   Gen: No apparent distress, A&O x 3.  Detailed Urogynecologic Evaluation:  Deferred.    ASSESSMENT AND PLAN    [name] is a 78 y.o. with:  1. Overactive bladder   2. SUI (stress urinary incontinence, female)      Overactive bladder -     Mirabegron  ER; Take 1 tablet (50 mg total) by mouth daily.  Dispense: 90 tablet; Refill: 3  SUI (stress urinary incontinence, female)  - Will increase Myrbetriq  dose to 50mg  to see if that improves her nighttime  symptoms.  - Will start pelvic PT end of June  F/u 3 months or sooner if needed   Arma Lamp, MD

## 2024-04-02 ENCOUNTER — Ambulatory Visit (HOSPITAL_BASED_OUTPATIENT_CLINIC_OR_DEPARTMENT_OTHER): Attending: Orthopedic Surgery | Admitting: Physical Therapy

## 2024-04-02 ENCOUNTER — Encounter (HOSPITAL_BASED_OUTPATIENT_CLINIC_OR_DEPARTMENT_OTHER): Payer: Self-pay | Admitting: Physical Therapy

## 2024-04-02 DIAGNOSIS — M6281 Muscle weakness (generalized): Secondary | ICD-10-CM

## 2024-04-02 DIAGNOSIS — R262 Difficulty in walking, not elsewhere classified: Secondary | ICD-10-CM

## 2024-04-02 DIAGNOSIS — M25551 Pain in right hip: Secondary | ICD-10-CM | POA: Diagnosis not present

## 2024-04-02 DIAGNOSIS — M25552 Pain in left hip: Secondary | ICD-10-CM | POA: Diagnosis not present

## 2024-04-02 NOTE — Therapy (Signed)
 OUTPATIENT PHYSICAL THERAPY TREATMENT  Patient Name: Lauren [name] MRN: [id number] DOB:14-Apr-1946, 78 y.o., female Today's Date: 04/02/2024  END OF SESSION:  PT End of Session - 04/02/24 1059     Visit Number 14    Number of Visits 16    Date for PT Re-Evaluation 05/11/24    Authorization Type [employer] $10 copay    Progress Note Due on Visit 20    PT Start Time 1100    PT Stop Time 1150    PT Time Calculation (min) 50 min    Activity Tolerance Patient tolerated treatment well    Behavior During Therapy [name] for tasks assessed/performed             Past Medical History:  Diagnosis Date   Anxiety    Arthritis    Back, Hip- right   Atypical chest pain 06/25/2021   Back pain    Cancer (HCC)    right breast   Cataract    Colitis    Coronary artery calcification 02/20/2020   Diabetes mellitus without complication (HCC)    Type II   Edema 03/05/2021   Edema of both lower extremities    Endometriosis    Family history of adverse reaction to anesthesia    Father - N/V - blood pressure; daughter N/V   Family history of breast cancer    Family history of colon cancer    Family history of kidney cancer    Family history of prostate cancer    Fatty liver    GERD (gastroesophageal reflux disease)    History of hiatal hernia    History of kidney stones    History of ulcerative colitis    Hypertension    Insomnia    Joint pain    Kidney problem    Lower extremity edema 03/05/2021   Lumbar disc disease    L4 L5   Neuropathy    Osteoarthritis    Other hyperlipidemia    Palpitations    Personal history of radiation therapy    Pneumonia    1983, 2003   Pure hypercholesterolemia 02/20/2020   PVCs (premature ventricular contractions)    benign   Umbilical hernia    Vitamin D  deficiency    Wears glasses    Past Surgical History:  Procedure Laterality Date   APPENDECTOMY     BREAST BIOPSY  08-13-2009  DR TSUEI   EXCISION LEFT NIPPLE DUCT   BREAST  EXCISIONAL BIOPSY Left    x2   BREAST EXCISIONAL BIOPSY Right    BREAST LUMPECTOMY Right 11/16/2018   invasive ductal   CHOLECYSTECTOMY  1992   COLON RESECTION  1986   ULCERATIVE COLITIS   COLON SURGERY     ESOPHAGEAL MANOMETRY N/A 06/11/2013   Procedure: ESOPHAGEAL MANOMETRY (EM);  Surgeon: Mathew Solomon, MD;  Location: [facility] ENDOSCOPY;  Service: Endoscopy;  Laterality: N/A;   ESOPHAGOGASTRODUODENOSCOPY (EGD) WITH PROPOFOL  N/A 05/31/2016   Procedure: ESOPHAGOGASTRODUODENOSCOPY (EGD) WITH PROPOFOL ;  Surgeon: Ozell Blunt, MD;  [facility];  Service: Endoscopy;  Laterality: N/A;   EXCISION RIGHT BREAST MASS   10-20-2005  DR Donata Fryer YOUNG   EYE SURGERY Bilateral 2023   cataract   FRACTURE SURGERY     left arm   kidney stone removed  12/2009   KNEE ARTHROSCOPY Left    MCL   LEFT THUMB CARPOMETACARPAL JOINT SUSPENSIONPLASTY  04-06-2006  DR Donzella Galley   LEFT WRIST ARTHROSCOPY W/ DEBRIDEMENT AND REMOVAL CYST  03-26-2004  DR Donzella Galley  MYOMECTOMY     RADIOACTIVE SEED GUIDED PARTIAL MASTECTOMY WITH AXILLARY SENTINEL LYMPH NODE BIOPSY Right 11/16/2018   Procedure: RIGHT BREAST RADIOACTIVE SEED GUIDED PARTIAL MASTECTOMY WITH  SENTINEL  NODE BIOPSY;  Surgeon: Oza Blumenthal, MD;  Location: [facility] OR;  Service: General;  Laterality: Right;   RIGHT COLECTOMY     RIGHT URETEROSCOPIC STONE EXTRACTION  12-11-2009  DR Autry [facility]   TENDON RELEASE     TONSILLECTOMY     TOTAL HIP ARTHROPLASTY Right 11/26/2016   Procedure: RIGHT TOTAL HIP ARTHROPLASTY ANTERIOR APPROACH;  Surgeon: Arnie Lao, MD;  Location: [facility] ORS;  Service: Orthopedics;  Laterality: Right;   TOTAL HIP ARTHROPLASTY Left 12/06/2023   Procedure: LEFT TOTAL HIP ARTHROPLASTY ANTERIOR APPROACH;  Surgeon: Arnie Lao, MD;  Location: [facility] OR;  Service: Orthopedics;  Laterality: Left;   TOTAL KNEE ARTHROPLASTY Left 12/02/2017   Procedure: LEFT TOTAL KNEE ARTHROPLASTY;  Surgeon: Arnie Lao, MD;  Location: [facility] ORS;   Service: Orthopedics;  Laterality: Left;   TOTAL KNEE ARTHROPLASTY Right 08/03/2022   Procedure: RIGHT TOTAL KNEE ARTHROPLASTY;  Surgeon: Arnie Lao, MD;  Location: [facility] OR;  Service: Orthopedics;  Laterality: Right;   WRIST SURGERY     both   Patient Active Problem List   Diagnosis Date Noted   Overactive bladder 02/22/2024   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/2023   SUI (stress urinary incontinence, female) 11/22/2023   Restless leg syndrome 09/22/2023   Other hyperlipidemia 06/08/2023   Unilateral primary osteoarthritis, left hip 05/11/2023   Depression 03/16/2023   Allergic rhinitis 12/29/2022   Seasonal affective disorder (HCC) 12/01/2022   BMI 32.0-32.9,adult 12/01/2022   Obesity, Beginning BMI 37.59 12/01/2022   OA (osteoarthritis) of knee 08/03/2022   Status post total right knee replacement 08/03/2022   Stress 07/19/2022   Other constipation 07/19/2022   Atypical chest pain 06/25/2021   Class 2 severe obesity with serious comorbidity and body mass index (BMI) of 37.0 to 37.9 in adult (HCC) 06/03/2021   Vitamin D  deficiency 06/03/2021   Other fatigue 04/22/2021   SOB (shortness of breath) on exertion 04/22/2021   Lower extremity edema 03/05/2021   Coronary artery calcification 02/20/2020   Pure hypercholesterolemia 02/20/2020   Unilateral primary osteoarthritis, right knee 01/03/2019   Genetic testing 12/07/2018   Family history of breast cancer    Family history of prostate cancer    Family history of colon cancer    Family history of kidney cancer    Malignant neoplasm of upper-outer quadrant of right breast in female, 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/09/2018   Diabetic neuropathy, type II diabetes mellitus (HCC) 11/09/2018   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/02/2017   Trochanteric bursitis, right hip 05/10/2017   Status post total replacement of right hip 11/26/2016   DDD (degenerative disc disease), 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    Low back pain 04/23/2015   Hiatal hernia 04/13/2013   Diabetes mellitus (HCC) 01/26/2013   Umbilical hernia 07/24/2012   Essential hypertension     PCP: Jimmey Mould, MD   REFERRING PROVIDER: Shauna Del, DO   REFERRING DIAG:  N56.21 (ICD-10-CM) - Proximal limb muscle weakness  M25.551,M25.552 (ICD-10-CM) - Bilateral hip pain  Z96.642 (ICD-10-CM) - Status post total replacement of left hip  Z96.641 (ICD-10-CM) - Status post total replacement of right hip    THERAPY DIAG:  Pain in left hip  Pain in right hip  Muscle weakness (generalized)  Difficulty in walking, not elsewhere classified  Rationale for Evaluation and Treatment: Rehabilitation  ONSET DATE: Chronic pain , Patient 7wks s/p L THA 12/06/2023  SUBJECTIVE:   SUBJECTIVE STATEMENT: Pt reports that the tape on front of R hip was helpful, it reduced her pain, but after taking the tape off the pain was worse. "It was bad the rest of the weekend, like a 6(out of 10)". She reports continued difficulty with getting in/out of car.   POOL ACCESS: pt is member of [employer]   PERTINENT HISTORY: R THA, L TKA, anxiety, DM  PAIN:  Are you in pain? yes NPRS scale: 3-4/10   Pain location: hips R>L  Pain description: sore Aggravating factors: being on feet for a while Relieving factors: ice, rest, pain meds  PRECAUTIONS: Anterior hip  WEIGHT BEARING RESTRICTIONS: No  FALLS:  Has patient fallen in last 6 months? No  LIVING ENVIRONMENT: Lives with: lives alone, 4 mo old basset hound Lives in: House/apartment Stairs: Yes: Internal: 1 steps; going from den to [employer] Has following equipment at home: Single point cane  OCCUPATION: retired- Chiropodist  PLOF: Independent  PATIENT GOALS: to be able to be in less pain/tolerate things better  Next MD visit: Dr. Vaughn Georges in June 2025   OBJECTIVE:   DIAGNOSTIC FINDINGS:  Left hip arthroplasty in expected alignment. No periprosthetic lucency or fracture.  Recent postsurgical change includes air and edema in the soft tissues. Lateral skin staples in place. A right hip arthroplasty.   IMPRESSION: Left hip arthroplasty without immediate postoperative complication.    PATIENT SURVEYS:  Patient-Specific Activity Scoring Scheme  "0" represents "unable to perform." "10" represents "able to perform at prior level. 0 1 2 3 4 5 6 7 8 9  10 (Date and Score)   Activity Eval  03/07/2024   1. Stairs 5  6  2. Getting in/out cars 4  8  3. Getting in/out bed 4 10  4.    5.    Score 4.33/10 8/10   Total score = sum of the activity scores/number of activities Minimum detectable change (90%CI) for average score = 2 points Minimum detectable change (90%CI) for single activity score = 3 points  COGNITION: Overall cognitive status: WFL    SENSATION: WFL  EDEMA:  Not present  MUSCLE LENGTH: Not tested  POSTURE:  No Significant postural limitations  PALPATION: Tender to Lt hip flexor tendon and anteriomedial quadricep, Rt hip flexor tendon and medial quadricep  LOWER EXTREMITY ROM:   ROM Right eval Left eval Left 02/29/24  Hip flexion 100 80 88  Hip extension     Hip abduction     Hip adduction     Hip internal rotation     Hip external rotation     Knee flexion     Knee extension     Ankle dorsiflexion     Ankle plantarflexion     Ankle inversion     Ankle eversion      (Blank rows = not tested)  LOWER EXTREMITY MMT:  MMT Right eval Left eval Right 02/14/24 Left 02/14/24 Left 02/29/24  Hip flexion 3+ 3- 4 4- 4  Hip extension       Hip abduction       Hip adduction       Hip internal rotation       Hip external rotation       Knee flexion 4 4-; painful 4+ 4+ 5  Knee extension 3+; painful 3+; painful 4+ 4+ 5  Ankle dorsiflexion 5  5     Ankle plantarflexion       Ankle inversion       Ankle eversion        (Blank rows = not tested)  LOWER EXTREMITY SPECIAL TESTS:  Hip: none Back: (+) SLR bilateral   FUNCTIONAL  TESTS:  01/23/24 18 inch chair transfer: is not able to rise to standing without use of UE on armrest and rocking for momentum TUG: 16.78 with use of UE on armrest  02/29/24 Can not perform sit to stand without UE support now TUG 10.84 seconds  03/07/2024 5 times sit to stand:  15.57 sec Timed Up and Go (TUG):  9.87 sec 6 minute walk test:  954 ft with RPE of 7/10 (age related norms 1,200 ft Modified CTSIB:  condition 1- 30 sec, condition 2- 30 sec (with sway noted), condition 3-  30 sec, condition 4- 30 sec (with increased sway noted).  Total 120/120  GAIT: Distance walked: clinical distance Assistive device utilized: None Level of assistance: SBA Comments: antalgic. Decreased Lt LE stance time, narrow step width, decreased step length, decreased speed                                                                                                                                                                        TODAY'S TREATMENT [facility] Adult PT Treatment:                                            04/02/24 Pt seen for aquatic therapy today.  Treatment took place in water  3.5-4.75 ft in depth at the [facility] pool. Temp of water  was 91.  Pt entered/exited the pool via stairs independent in step-to pattern  with bilat rail.   -self care: pt shown demo of MFR with ball to ant hip in prone position and STM with roller stick; pt verbalized understanding  - manual therapy: IASTM/ STM to R ant/lateral hip/quad to decrease fascial restrictions and improve mobility   - unsupported walking forward/ backward multiple laps, cues for even step length -side stepping-> UE on rainbow hand floats with arm addct/ abdct  - side marching with UE holding rainbow hand floats (painful in R hip with increased step height) - UE on wall:  hip add/abd, crossing midline x 12 ; hip flexion /extension x12;  - reciprocal pattern stair climbing up/down 4 steps with unilateral rail 3 reps  - sit to stand  on 3rd step in water  with forward arm reach x 3  [facility] Adult PT Treatment:  03/28/24 Pt seen for aquatic therapy today.  Treatment took place in water  3.5-4.75 ft in depth at the [facility] pool. Temp of water  was 91.  Pt entered/exited the pool via stairs independent in step-to pattern  with bilat rail.  - unsupported walking forward/ backward multiple laps, cues for even step length - UE on rainbow hand floats-> arm addct/ abdct with side stepping -> side marching with UE on surface - UE on wall:  hip add/abd, crossing midline x 10 ; hip flexion /extension x10; - no UE support: trial of SLS  - sit to stand on 3rd step in water  with UE support -> no UE support x 6  After dried off:  IASTM to R ant/lateral  quad to decrease fascial restrictions.  I strip of sensitive skin tape applied to R ant hip incision in zig zag pattern to assist with scar mobilization and desensitization.  Pt reminded of instructions of safe removal technique; pt verbalized understanding.  [facility] Adult PT Treatment:                                            03/21/24 Pt seen for aquatic therapy today.  Treatment took place in water  3.5-4.75 ft in depth at the [facility] pool. Temp of water  was 91.  Pt entered/exited the pool via stairs independent in step-to pattern  with bilat rail.  - unsupported walking forward/ backward multiple laps, cues for even step length - UE on rainbow hand floats-> arm addct/ abdct with side stepping  - UE on wall:  heel raises x 10; hip add/abd x 10 ; hip flexion /extension x10; relaxed squats x10 - straddling noodle with UE support on wall: cycling, suspended jumping jacks and cross country ski - repeated circuts  After dried off:  IASTM to R ant/lateral mid-prox quad to decrease fascial restrictions.  I strip of sensitive skin tape applied to R ant hip incision in zig zag pattern to assist with scar mobilization and  desensitization.  Pt given instructions on rationale and safe removal technique; pt verbalized understanding.  [facility] Adult PT Treatment:                                            03/19/24 Pt seen for aquatic therapy today.  Treatment took place in water  3.5-4.75 ft in depth at the [facility] pool. Temp of water  was 91.  Pt entered/exited the pool via stairs independent in step-to pattern  with bilat rail.  - Intro to aquatic therapy principles - unsupported-> UE on rainbow hand floats walking forward/ backward - UE on rainbow hand floats-> arm addct/ abdct with side stepping  - UE on wall:  heel raises x 10; hip add/abd x 10 ; hip flexion /extension x10; relaxed squats x10 - straddling noodle with UE support: cycling   Pt requires the buoyancy and hydrostatic pressure of water  for support, and to offload joints by unweighting joint load by at least 50 % in navel deep water  and by at least 75-80% in chest to neck deep water .  Viscosity of the water  is needed for resistance of strengthening. Water  current perturbations provides challenge to standing balance requiring increased core activation.     03/07/2024: Nustep level 5 x6 min with PT  present to discuss status PSFS average is 8/10 5 times sit to stand and TUG 6 minute walk test:  954 ft with RPE of 7/10 Modified CTSIB with close SBA when eyes closed Seated with 6# dumbbell on thigh performing him flexion/abduction over small cone.  2x10 bilat Marching holding 6# dumbbell x10 bilat (Farmer's carry high march) Education on Aquatics PT   02/29/24 TherAct Standing marching 2x10 reps blat L3 band Standing abduction 2x10 reps with L3 band bil; UE support needed Standing hip extension green band 2X10 bilat Step ups lateral 6 inch step X 10 bilat with UE support Forward step ups 6 inch step X 10 bilat with one UE support Seated marching over cone to simulate picking leg up to get in and out of car, 3 sets of 5 over and back Tandem  walk in bars without UE support to improve balance for walking in straight line 5 round trips in bars Tandem balance 30 sec X 2 bilat  Therex Recumbent bike L1 X 5 min   02/27/24 TherAct Standing marching 2x10 reps blat L3 band Standing abduction 2x10 reps with L3 band bil; UE support needed Standing hip extension green band 2X10 bilat Step ups lateral 6 inch step X 10 bilat with UE support Forward step ups 6 inch step X 10 bilat with one UE support Seated marching over cone to simulate picking leg up to get in and out of car, 3 sets of 5 over and back Tandem walk in bars without UE support to improve balance for walking in straight line 5 round trips in bars Tandem balance 30 sec X 2 bilat Walking over hurdles in bars without UE support, 3 round trips, down with right leg first, back with left leg first  Therex Nu step L6 X 8 min      PATIENT EDUCATION:  Education details: intro to aquatic therapy Person educated: Patient Education method: Programmer, multimedia, Demonstration, Verbal cues,  Education comprehension: verbalized understanding, returned demonstration, and verbal cues required  HOME EXERCISE PROGRAM: [place] Access Code: [email or web address] URL: [email or web address] Date: 01/25/2024  -02/14/24: added seated march over object, standing march over object, and tandem walking  to help with getting in/out of car as well as balance for walking in straight line reducing staggering to right or left during ambulation. She denies needing picture of these.  ASSESSMENT:  CLINICAL IMPRESSION: Pt reported some improvement of R hip symptoms with kinesiology tape applied over R hip incision into R prox quad during the last 2 applications, however after removal she had some increase in symptoms.  Performed IASTM/STM to area with some reduction of tightness, but held off on reapplication.   She did report increased R hip pain with marching if step height too great. She tolerated all other  exercises well, without increase in symptoms.  Overall she has had positive response to STM/IASTM to R quad and to aquatic therapy. She has reported some improvement in her mobility at home. Encouraged pt to trial self-STM with rolling pin to RLE prior to next session.  Will plan to create aquatic HEP next session, and begin instruction so that she can transition to exercise in water  at [facility] with daughter.  Patient would benefit from continued skilled PT  to progress towards goal related activities.    OBJECTIVE IMPAIRMENTS: Abnormal gait, decreased activity tolerance, decreased balance, decreased endurance, decreased mobility, difficulty walking, decreased ROM, decreased strength, increased edema, impaired flexibility, and pain.   ACTIVITY LIMITATIONS: carrying, lifting, bending, sitting, standing, squatting,  stairs, and locomotion level  PARTICIPATION LIMITATIONS: meal prep, cleaning, laundry, driving, shopping, and community activity  PERSONAL FACTORS: Age, Fitness, Time since onset of injury/illness/exacerbation, and 3+ comorbidities: see above are also affecting patient's functional outcome.   REHAB POTENTIAL: Good  CLINICAL DECISION MAKING: Stable/uncomplicated  EVALUATION COMPLEXITY: Low   GOALS: Goals reviewed with patient? Yes  SHORT TERM GOALS: (target date for Short term goals are 3 weeks 02/13/2024)   1.  Patient will demonstrate independent use of home exercise program to maintain progress from in clinic treatments.  Goal status: MET 02/07/24  LONG TERM GOALS: (target dates for all long term goals are 8 weeks  05/11/2024 )   1. Patient will demonstrate/report pain at worst less than or equal to 3/10 to facilitate minimal limitation in daily activity secondary to pain symptoms. Goal status: some MET 02/29/24   2. Patient will improve PSFS to > or equal to 6.5/10 to show improved function. Goal status: Met on 03/07/2024    3.  Patient will demonstrate BLE MMT 4+/5  throughout to faciltiate usual transfers, stairs, squatting at [facility] for daily life.  Goal status: MET 02/29/24   4.  Patient will demonstrate/report ability to perform 18 inch chair transfer s UE assist.  Goal status: MET 02/29/24   5.  Patient will demonstrate TUG </=13.5sec in effort to reduce risk of falls Goal status:MET  02/29/24   6.  Patient will demonstrate up and down a flight of stairs with single hand rail with reciprocal gait pattern Goal Status: In progress 04/02/24  7.  Patient will increase her 6 minute walk test to at least 1,200 ft with no loss of balance and no increased lateral sway.  Baseline:  954 ft with RPE of 7/10  Goal Status:  NEW on 03/07/24   PLAN:  PT FREQUENCY: 1-2x/week  PT DURATION: 8 weeks  PLANNED INTERVENTIONS: Can include 47425- PT Re-evaluation, 97110-Therapeutic exercises, 97530- Therapeutic activity, 97112- Neuromuscular re-education, 97535- Self Care, 97140- Manual therapy, [phone]- Gait training, [phone]- Orthotic Fit/training, [phone]- Canalith repositioning, [id number]- Aquatic Therapy, [phone]- Electrical stimulation (unattended), [phone]- Electrical stimulation (manual), [id number] Physical performance testing, 97016- Vasopneumatic device, [id number]- Ultrasound, [id number]- Traction (mechanical), [id number]- Ionotophoresis 4mg /ml Dexamethasone , Patient/Family education, Balance training, Stair training, Taping, Dry Needling, Joint mobilization, Joint manipulation, Spinal manipulation, Spinal mobilization, Scar mobilization, Vestibular training, Visual/preceptual remediation/compensation, DME instructions, Cryotherapy, and Moist heat.  All performed as medically necessary.  All included unless contraindicated  Plan for next visit:  Assess and progress HEP as indicated, Aquatics, strengthening, flexibility, manual/dry needling as indicated    Almedia Jacobsen, PTA 04/02/24 11:57 AM [facility] [address] [place], [place],  [address] Phone: [phone]   Fax:  [phone]

## 2024-04-03 ENCOUNTER — Ambulatory Visit: Admitting: Sports Medicine

## 2024-04-03 ENCOUNTER — Ambulatory Visit (INDEPENDENT_AMBULATORY_CARE_PROVIDER_SITE_OTHER): Admitting: Family Medicine

## 2024-04-03 ENCOUNTER — Encounter (INDEPENDENT_AMBULATORY_CARE_PROVIDER_SITE_OTHER): Payer: Self-pay | Admitting: Family Medicine

## 2024-04-03 VITALS — BP 135/78 | HR 68 | Temp 97.8°F | Ht 63.0 in | Wt 180.0 lb

## 2024-04-03 DIAGNOSIS — E119 Type 2 diabetes mellitus without complications: Secondary | ICD-10-CM

## 2024-04-03 DIAGNOSIS — E785 Hyperlipidemia, unspecified: Secondary | ICD-10-CM | POA: Diagnosis not present

## 2024-04-03 DIAGNOSIS — Z96642 Presence of left artificial hip joint: Secondary | ICD-10-CM

## 2024-04-03 DIAGNOSIS — M7611 Psoas tendinitis, right hip: Secondary | ICD-10-CM | POA: Diagnosis not present

## 2024-04-03 DIAGNOSIS — Z96641 Presence of right artificial hip joint: Secondary | ICD-10-CM | POA: Diagnosis not present

## 2024-04-03 DIAGNOSIS — E1140 Type 2 diabetes mellitus with diabetic neuropathy, unspecified: Secondary | ICD-10-CM

## 2024-04-03 DIAGNOSIS — Z7985 Long-term (current) use of injectable non-insulin antidiabetic drugs: Secondary | ICD-10-CM

## 2024-04-03 DIAGNOSIS — E782 Mixed hyperlipidemia: Secondary | ICD-10-CM

## 2024-04-03 DIAGNOSIS — Z6831 Body mass index (BMI) 31.0-31.9, adult: Secondary | ICD-10-CM

## 2024-04-03 DIAGNOSIS — M6281 Muscle weakness (generalized): Secondary | ICD-10-CM | POA: Diagnosis not present

## 2024-04-03 DIAGNOSIS — E669 Obesity, unspecified: Secondary | ICD-10-CM | POA: Diagnosis not present

## 2024-04-03 DIAGNOSIS — M25552 Pain in left hip: Secondary | ICD-10-CM | POA: Diagnosis not present

## 2024-04-03 DIAGNOSIS — M25551 Pain in right hip: Secondary | ICD-10-CM | POA: Diagnosis not present

## 2024-04-03 MED ORDER — TIRZEPATIDE 7.5 MG/0.5ML ~~LOC~~ SOAJ: 7.5000 mg | SUBCUTANEOUS | 0 refills | Status: DC

## 2024-04-03 MED ORDER — ROSUVASTATIN CALCIUM 20 MG PO TABS: 20.0000 mg | ORAL_TABLET | Freq: Every day | ORAL | 0 refills | Status: DC

## 2024-04-03 NOTE — Progress Notes (Unsigned)
 Patient says that she was doing well until this past weekend. She has finished her land-based therapy, and is near the end of her aquatic therapy sessions. She says that she is typically sore the day after therapy, but this past weekend the soreness through the front of her hips felt like it did prior to any of her physical therapy. Her right side is worse than her left. She is still doing her exercises at home, typically every other day, in routine with her scheduled appointments.

## 2024-04-03 NOTE — Progress Notes (Signed)
 Office: [phone]  /  Fax: [phone]  WEIGHT SUMMARY AND BIOMETRICS  Anthropometric Measurements Height: 5\' 3"  (1.6 m) Weight: 180 lb (81.6 kg) BMI (Calculated): 31.89 Weight at Last Visit: 180 lb Weight Lost Since Last Visit: 0 Weight Gained Since Last Visit: 0 Starting Weight: 219 lb Total Weight Loss (lbs): 39 lb (17.7 kg) Peak Weight: 222 lb   Body Composition  Body Fat %: 48.5 % Fat Mass (lbs): 87.6 lbs Muscle Mass (lbs): 88.4 lbs Total Body Water  (lbs): 74 lbs Visceral Fat Rating : 15   Other Clinical Data Fasting: no Labs: no Today's Visit #: 36 Starting Date: 04/22/21    Chief Complaint: OBESITY   Discussed the use of AI scribe software for clinical note transcription with the patient, who gave verbal consent to proceed.  History of Present Illness Lauren [name] "Lauren [name]" is a 78 year old female with obesity and type two diabetes who presents for obesity treatment and progress assessment.  They are following a dietary plan with calorie goals set between 1200 to 1300 calories and a protein intake of at least 80 grams, with adherence about 50-60% of the time. They engage in physical therapy for 60 minutes, three times a week. They have experienced weight fluctuations, noting a recent increase from 180 to 185 pounds, and are working to return to their original plan to manage their weight.  They have type two diabetes and are currently being treated with Mounjaro . They request a refill of Mounjaro  today. No issues reported with Mounjaro , although they sometimes feel it is not working enough, but recognize its effects by reduced hunger at dinner time.  They are managing hyperlipidemia and are on Crestor  20 mg. They mention a recent increase in their Bectric dosage from 25 to 50 mg, which has coincided with weight fluctuations. They have been monitoring their medication refills and insurance coverage closely.  They discuss dietary habits, including the use  of Oikos yogurt with protein, although they find the texture challenging. They are mindful of sodium intake, especially when consuming meals prepared outside the home, such as those from [employer]. High sodium intake leads to fluid retention, as evidenced by tight rings after consuming such meals.  They share personal anecdotes about their family history, mentioning their mother's body changes with age and their own experiences with weight management. They also discuss their social activities, including attending church and participating in group meetings.      PHYSICAL EXAM:  Blood pressure 135/78, pulse 68, temperature 97.8 F (36.6 C), height 5\' 3"  (1.6 m), weight 180 lb (81.6 kg), SpO2 98%. Body mass index is 31.89 kg/m.  DIAGNOSTIC DATA REVIEWED:  BMET    Component Value Date/Time   NA 130 (L) 12/07/2023 0736   NA 132 (L) 06/08/2023 0941   K 3.4 (L) 12/07/2023 0736   CL 95 (L) 12/07/2023 0736   CO2 25 12/07/2023 0736   GLUCOSE 118 (H) 12/07/2023 0736   BUN 15 12/07/2023 0736   BUN 14 06/08/2023 0941   CREATININE 0.97 12/07/2023 0736   CREATININE 0.87 06/08/2022 1439   CALCIUM  8.7 (L) 12/07/2023 0736   GFRNONAA 60 (L) 12/07/2023 0736   GFRNONAA >60 06/08/2022 1439   GFRAA >60 02/04/2020 1212   GFRAA >60 11/09/2018 1405   Lab Results  Component Value Date   HGBA1C 6.0 (H) 06/08/2023   HGBA1C 6.7 (H) 11/16/2016   Lab Results  Component Value Date   INSULIN  25.6 (H) 06/08/2023   INSULIN  22.3 04/22/2021  Lab Results  Component Value Date   TSH 2.150 05/11/2022   CBC    Component Value Date/Time   WBC 10.9 (H) 11/30/2023 1347   RBC 4.25 11/30/2023 1347   HGB 12.4 11/30/2023 1347   HGB 12.3 06/08/2022 1439   HGB 11.8 05/11/2022 0954   HCT 37.5 11/30/2023 1347   HCT 35.1 05/11/2022 0954   PLT 327 11/30/2023 1347   PLT 272 06/08/2022 1439   PLT 265 05/11/2022 0954   MCV 88.2 11/30/2023 1347   MCV 87 05/11/2022 0954   MCH 29.2 11/30/2023 1347   MCHC 33.1  11/30/2023 1347   RDW 13.3 11/30/2023 1347   RDW 13.2 05/11/2022 0954   Iron Studies No results found for: "IRON", "TIBC", "FERRITIN", "IRONPCTSAT" Lipid Panel     Component Value Date/Time   CHOL 123 06/08/2023 0941   TRIG 75 06/08/2023 0941   HDL 67 06/08/2023 0941   LDLCALC 41 06/08/2023 0941   Hepatic Function Panel     Component Value Date/Time   PROT 6.7 06/08/2023 0941   ALBUMIN  4.3 06/08/2023 0941   AST 19 06/08/2023 0941   AST 16 06/08/2022 1439   ALT 16 06/08/2023 0941   ALT 12 06/08/2022 1439   ALKPHOS 58 06/08/2023 0941   BILITOT 0.4 06/08/2023 0941   BILITOT 0.4 06/08/2022 1439   BILIDIR <0.1 (L) 07/30/2015 1338   IBILI NOT CALCULATED 07/30/2015 1338      Component Value Date/Time   TSH 2.150 05/11/2022 0954   Nutritional Lab Results  Component Value Date   VD25OH 57.5 02/07/2024   VD25OH 124.0 (H) 09/22/2023   VD25OH 100.0 06/08/2023     Assessment and Plan Assessment & Plan Type 2 Diabetes Mellitus Currently managed with Mounjaro , alongside significant dietary and exercise modifications. They report occasional concerns about the medication's efficacy, though it effectively suppresses appetite at dinner. - Refill Mounjaro  prescription.  Obesity Engaged in weight management through dietary changes and physical activity. Adheres to a category two eating plan with a calorie goal of 1200-1300 calories and at least 80 grams of protein, approximately 50-60% of the time. Participates in physical therapy, exercising 60 minutes three times weekly. Temporary weight gain noted, likely due to fluid retention and dietary sodium intake. Emphasized protein intake to prevent metabolic slowdown and advised monitoring sodium intake, particularly with meals prepared outside the home. - Continue category two eating plan with calorie and protein goals. - Continue physical therapy exercise regimen. - Monitor sodium intake to manage fluid  retention.  Hyperlipidemia Managed with Crestor . Discussed pharmacy's handling of medication refills; no issues with the medication itself reported. They are aware of the need for proactive management of medication refills. - Refill Crestor  prescription.  General Health Maintenance Actively managing health through diet, exercise, and medication adherence. Discussed the importance of a balanced diet with fruits and vegetables, despite low carbohydrate challenges. Encouraged to consume fresh produce and monitor sodium in processed foods. - Encourage balanced diet with adequate fruits and vegetables. - Monitor sodium intake.  Follow-up Scheduled for follow-up appointments to monitor progress and adjust treatment as necessary. Undergoing aquatic and pelvic therapy, considering further interventions for chronic issues. - Schedule follow-up appointment on July 1st at 2:40 PM. - Schedule follow-up appointment on August 6th at 11:00 AM.       She was informed of the importance of frequent follow up visits to maximize her success with intensive lifestyle modifications for her multiple health conditions.    Jasmine Mesi, MD

## 2024-04-03 NOTE — Progress Notes (Signed)
 Office Visit Note   Patient: Lauren [name]           Date of Birth: [birth year]           MRN: [id number] Visit Date: 04/03/2024              Requested by: Jimmey Mould, MD [address] [place],  [place] [address] PCP: Jimmey Mould, MD  Medical Resident, Sports Medicine Fellow - Attending Physician Addendum:   I have independently interviewed and examined the patient myself. I have discussed the above with the original author and agree with their documentation. My edits for correction/addition/clarification have been made, see any changes above and below.   In summary, very pleasant 78 year old female who I know well and have seen for both of her hips, in the setting of bilateral hip replacement.  Her left hip has done much better since her THA on 12/06/2023 but still having some issues with this in the right hip.  She has had worsening and progressive right anterior hip pain that seems to emanate from the psoas tendon.  Last year we did perform an ultrasound-guided iliopsoas tendon sheath injection which gave her excellent relief and essentially resolved her pain but only temporarily.  She has had many sessions of both land-based and aquatic-based physical therapy.  She has underwent trigger point injections, dry needling, as well as multiple treatments of extracorporeal shockwave therapy with good temporary relief but nothing long-lasting.  Her pain is limiting her flexion and limiting her ADLs and basic movement.  Her pain is quite significant.  Given this, I would like to proceed with an ultrasound-guided iliopsoas tendon sheath injection in the near future for both diagnostic and therapeutic purposes.  If she has great relief from this and is able to flex the hip without difficulty during the anesthetic portion, she could be a candidate for iliopsoas tendon release with Dr. Hermina Loosen.  Given that Dr. Lucienne Ryder replaced her hips, I would like to clear this with him first before  proceeding.  Lauren [name] is agreeable and understanding.  She will continue her hydrocodone  10-325 mg 3 times daily in the interim for her chronic pain.  Lauren Del, DO Primary Care Sports Medicine Physician  [facility]   Assessment & Plan: Visit Diagnoses:  1. Bilateral hip pain   2. Proximal limb muscle weakness   3. Psoas tendinitis of right side   4. Status post total replacement of right hip   5. Status post total replacement of left hip    Plan: Had a lengthy discussion with the patient regarding her pain.  Patient's pain appears to be mainly coming from the hip flexor region particularly psoas.  We discussed the neck step options with patient after identifying her pain and at this time both us  and patient are agreeable that the next step would be to repeat a iliopsoas tendon sheath injection on the right side mainly as a diagnostic tool.  If the patient has significant improvement from this injection, we will plan to have patient follow-up with Dr.Bokshan for possible psoas tendon release.  For pain control, patient will continue her hydrocodone  10-325 mg 3 times daily for her chronic pain. Patient is understanding and agreeable with this plan.  Follow-Up Instructions: Return in about 2 weeks (around 04/17/2024) for For US -guided R-psoas injection   Orders:  No orders of the defined types were placed in this encounter.  No orders of the defined types were placed in  this encounter.   Procedures: No procedures performed   Clinical Data: No additional findings.   Subjective: Chief Complaint  Patient presents with   Right Hip - Follow-up   Left Hip - Follow-up    Patient is presenting for follow-up of her bilateral hip pain.  Patient has been having some improvement of her lateral hip pain but is still continue to have the bilateral anterior hip pain that appears to be coming from the psoas region.  Patient has had a psoas injection in the past  approximately 1 year ago and noted significant relief of her pain when she did.  Patient is now postop bilateral hip arthroplasties.  Patient's pain has progressed to the point where she feels like she cannot even swing her leg up onto the bed.  Patient also notes that she had a bigger car and she changed to a smaller car in order to get her leg into the car.  Patient has significant pain with hip flexion and states that the pain is more on the right than the left but it is bilateral.  No numbness tingling or any other concerns at this time.    Review of Systems   Objective:  Physical Exam  Ortho Exam Inspection reveals no gross abnormalities of the bilateral hip.  Limb length is appropriate and equal bilaterally.  There is tenderness to palpation over the psoas region on the bilateral sides with the right being greater than the left.  Significant weakness with hip flexion as well as pain against resistance bilaterally with the right being greater than the left once again.  Hip abduction strength is appropriate bilaterally.  Quadricep strength is appropriate with knee flexion against resistance as well as knee extension.  Hip extension notes some pain as well as Specialty Comments:  No specialty comments available.  Imaging: No results found.  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/03/2022   Stress 07/19/2022   Other constipation  07/19/2022   Atypical chest pain 06/25/2021   Class 2 severe obesity with serious comorbidity and body mass index (BMI) of 37.0 to 37.9 in adult (HCC) 06/03/2021   Vitamin D  deficiency 06/03/2021   Other fatigue 04/22/2021   SOB (shortness of breath) on exertion 04/22/2021   Lower extremity edema 03/05/2021   Coronary artery calcification 02/20/2020   Pure hypercholesterolemia 02/20/2020   Unilateral primary osteoarthritis, right knee 01/03/2019   Genetic testing 12/07/2018   Family history of breast cancer    Family history of prostate cancer    Family history of colon cancer    Family history of kidney cancer    Malignant neoplasm of upper-outer quadrant of right breast in female, 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/09/2018   Diabetic neuropathy, type II diabetes mellitus (HCC) 11/09/2018   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/02/2017   Trochanteric bursitis, right hip 05/10/2017   Status post total replacement of right hip 11/26/2016   DDD (degenerative disc disease), 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   Low  back pain 04/23/2015   Hiatal hernia 04/13/2013   Diabetes mellitus (HCC) 01/26/2013   Umbilical hernia 07/24/2012   Essential hypertension    Past Medical History:  Diagnosis Date   Anxiety    Arthritis    Back, Hip- right   Atypical chest pain 06/25/2021   Back pain    Cancer (HCC)    right breast   Cataract    Colitis    Coronary artery calcification 02/20/2020   Diabetes mellitus without complication (HCC)    Type II   Edema 03/05/2021   Edema of both lower extremities    Endometriosis    Family history of adverse reaction to anesthesia    Father - N/V - blood pressure; daughter N/V   Family history of breast cancer    Family history of colon cancer    Family history of kidney cancer    Family history of prostate cancer    Fatty liver    GERD (gastroesophageal reflux disease)    History of hiatal hernia    History of kidney stones    History of ulcerative  colitis    Hypertension    Insomnia    Joint pain    Kidney problem    Lower extremity edema 03/05/2021   Lumbar disc disease    L4 L5   Neuropathy    Osteoarthritis    Other hyperlipidemia    Palpitations    Personal history of radiation therapy    Pneumonia    1983, 2003   Pure hypercholesterolemia 02/20/2020   PVCs (premature ventricular contractions)    benign   Umbilical hernia    Vitamin D  deficiency    Wears glasses     Family History  Problem Relation Age of Onset   Hyperlipidemia Mother    Diabetes Mother    Hypertension Mother    Parkinson's disease Mother    Kidney disease Mother    Kidney disease Father    Stroke Father    Heart failure Father    Hypertension Father    Heart disease Father    Diabetes Father    Cancer Father        colon, prostate, and kidney   Heart disease Maternal Grandmother    Diabetes Maternal Grandmother    Heart disease Maternal Grandfather    Diabetes Maternal Grandfather    Cancer Maternal Grandfather        pt unaware of what kind   Stroke Paternal Grandmother    Stroke Paternal Grandfather    Cancer Maternal Aunt        cervical vs ovarian   Colon cancer Paternal Aunt        dx less than 50   Breast cancer Paternal Aunt        dx in her 72s   Prostate cancer Paternal Uncle    Breast cancer Cousin        pat first cousin, dx over 42   Prostate cancer Other        PGF's father    Past Surgical History:  Procedure Laterality Date   APPENDECTOMY     BREAST BIOPSY  08-13-2009  DR TSUEI   EXCISION LEFT NIPPLE DUCT   BREAST EXCISIONAL BIOPSY Left    x2   BREAST EXCISIONAL BIOPSY Right    BREAST LUMPECTOMY Right 11/16/2018   invasive ductal   CHOLECYSTECTOMY  1992   COLON RESECTION  1986   ULCERATIVE COLITIS   COLON SURGERY  ESOPHAGEAL MANOMETRY N/A 06/11/2013   Procedure: ESOPHAGEAL MANOMETRY (EM);  Surgeon: Mathew Solomon, MD;  Location: [facility] ENDOSCOPY;  Service: Endoscopy;  Laterality: N/A;    ESOPHAGOGASTRODUODENOSCOPY (EGD) WITH PROPOFOL  N/A 05/31/2016   Procedure: ESOPHAGOGASTRODUODENOSCOPY (EGD) WITH PROPOFOL ;  Surgeon: Ozell Blunt, MD;  Location[facility] ENDOSCOPY;  Service: Endoscopy;  Laterality: N/A;   EXCISION RIGHT BREAST MASS   10-20-2005  DR Donata Fryer YOUNG   EYE SURGERY Bilateral 2023   cataract   FRACTURE SURGERY     left arm   kidney stone removed  12/2009   KNEE ARTHROSCOPY Left    MCL   LEFT THUMB CARPOMETACARPAL JOINT SUSPENSIONPLASTY  04-06-2006  DR Donzella Galley   LEFT WRIST ARTHROSCOPY W/ DEBRIDEMENT AND REMOVAL CYST  03-26-2004  DR Donzella Galley   MYOMECTOMY     RADIOACTIVE SEED GUIDED PARTIAL MASTECTOMY WITH AXILLARY SENTINEL LYMPH NODE BIOPSY Right 11/16/2018   Procedure: RIGHT BREAST RADIOACTIVE SEED GUIDED PARTIAL MASTECTOMY WITH  SENTINEL  NODE BIOPSY;  Surgeon: Oza Blumenthal, MD;  Location: [facility] OR;  Service: General;  Laterality: Right;   RIGHT COLECTOMY     RIGHT URETEROSCOPIC STONE EXTRACTION  12-11-2009  DR Autry [facility]   TENDON RELEASE     TONSILLECTOMY     TOTAL HIP ARTHROPLASTY Right 11/26/2016   Procedure: RIGHT TOTAL HIP ARTHROPLASTY ANTERIOR APPROACH;  Surgeon: Arnie Lao, MD;  Location: [facility] ORS;  Service: Orthopedics;  Laterality: Right;   TOTAL HIP ARTHROPLASTY Left 12/06/2023   Procedure: LEFT TOTAL HIP ARTHROPLASTY ANTERIOR APPROACH;  Surgeon: Arnie Lao, MD;  Location: [facility] OR;  Service: Orthopedics;  Laterality: Left;   TOTAL KNEE ARTHROPLASTY Left 12/02/2017   Procedure: LEFT TOTAL KNEE ARTHROPLASTY;  Surgeon: Arnie Lao, MD;  Location: [facility]S;  Service: Orthopedics;  Laterality: Left;   TOTAL KNEE ARTHROPLASTY Right 08/03/2022   Procedure: RIGHT TOTAL KNEE ARTHROPLASTY;  Surgeon: Arnie Lao, MD;  Location: [facility] OR;  Service: Orthopedics;  Laterality: Right;   WRIST SURGERY     both   Social History   Occupational History   Occupation: Retired  Tobacco Use   Smoking status: Former    Current packs/day:  0.00    Average packs/day: 1 pack/day for 10.0 years (10.0 ttl pk-yrs)    Types: Cigarettes    Start date: 09/22/1972    Quit date: 09/22/1982    Years since quitting: 41.5   Smokeless tobacco: Former    Quit date: 1983   Tobacco comments:    quit 1983  Vaping Use   Vaping status: Never Used  Substance and Sexual Activity   Alcohol use: No   Drug use: No   Sexual activity: Not Currently    Partners: Male    Birth control/protection: Post-menopausal

## 2024-04-04 ENCOUNTER — Emergency Department (HOSPITAL_BASED_OUTPATIENT_CLINIC_OR_DEPARTMENT_OTHER): Admitting: Radiology

## 2024-04-04 ENCOUNTER — Ambulatory Visit (HOSPITAL_BASED_OUTPATIENT_CLINIC_OR_DEPARTMENT_OTHER): Admitting: Physical Therapy

## 2024-04-04 ENCOUNTER — Other Ambulatory Visit: Payer: Self-pay

## 2024-04-04 ENCOUNTER — Emergency Department (HOSPITAL_BASED_OUTPATIENT_CLINIC_OR_DEPARTMENT_OTHER)
Admission: EM | Admit: 2024-04-04 | Discharge: 2024-04-04 | Disposition: A | Discharge status: Home / Self Care | Attending: Emergency Medicine | Admitting: Emergency Medicine

## 2024-04-04 ENCOUNTER — Encounter (HOSPITAL_BASED_OUTPATIENT_CLINIC_OR_DEPARTMENT_OTHER): Payer: Self-pay

## 2024-04-04 ENCOUNTER — Encounter: Payer: Self-pay | Admitting: Sports Medicine

## 2024-04-04 DIAGNOSIS — M5126 Other intervertebral disc displacement, lumbar region: Secondary | ICD-10-CM | POA: Diagnosis not present

## 2024-04-04 DIAGNOSIS — Y9301 Activity, walking, marching and hiking: Secondary | ICD-10-CM | POA: Diagnosis not present

## 2024-04-04 DIAGNOSIS — W01198A Fall on same level from slipping, tripping and stumbling with subsequent striking against other object, initial encounter: Secondary | ICD-10-CM | POA: Insufficient documentation

## 2024-04-04 DIAGNOSIS — W19XXXA Unspecified fall, initial encounter: Secondary | ICD-10-CM

## 2024-04-04 DIAGNOSIS — R0789 Other chest pain: Secondary | ICD-10-CM

## 2024-04-04 DIAGNOSIS — Z96651 Presence of right artificial knee joint: Secondary | ICD-10-CM | POA: Diagnosis not present

## 2024-04-04 DIAGNOSIS — M47816 Spondylosis without myelopathy or radiculopathy, lumbar region: Secondary | ICD-10-CM | POA: Diagnosis not present

## 2024-04-04 DIAGNOSIS — S20212A Contusion of left front wall of thorax, initial encounter: Secondary | ICD-10-CM | POA: Diagnosis not present

## 2024-04-04 DIAGNOSIS — M4134 Thoracogenic scoliosis, thoracic region: Secondary | ICD-10-CM | POA: Diagnosis not present

## 2024-04-04 DIAGNOSIS — M546 Pain in thoracic spine: Secondary | ICD-10-CM | POA: Diagnosis not present

## 2024-04-04 DIAGNOSIS — M4316 Spondylolisthesis, lumbar region: Secondary | ICD-10-CM | POA: Diagnosis not present

## 2024-04-04 DIAGNOSIS — S8002XA Contusion of left knee, initial encounter: Secondary | ICD-10-CM | POA: Diagnosis not present

## 2024-04-04 DIAGNOSIS — Z7982 Long term (current) use of aspirin: Secondary | ICD-10-CM | POA: Insufficient documentation

## 2024-04-04 DIAGNOSIS — Z043 Encounter for examination and observation following other accident: Secondary | ICD-10-CM | POA: Diagnosis not present

## 2024-04-04 DIAGNOSIS — S8992XA Unspecified injury of left lower leg, initial encounter: Secondary | ICD-10-CM | POA: Diagnosis present

## 2024-04-04 DIAGNOSIS — M549 Dorsalgia, unspecified: Secondary | ICD-10-CM | POA: Diagnosis not present

## 2024-04-04 DIAGNOSIS — M47814 Spondylosis without myelopathy or radiculopathy, thoracic region: Secondary | ICD-10-CM | POA: Diagnosis not present

## 2024-04-04 DIAGNOSIS — S8001XA Contusion of right knee, initial encounter: Secondary | ICD-10-CM

## 2024-04-04 LAB — URINALYSIS, ROUTINE W REFLEX MICROSCOPIC
Bacteria, UA: NONE SEEN
Bilirubin Urine: NEGATIVE
Glucose, UA: NEGATIVE mg/dL
Hgb urine dipstick: NEGATIVE
Nitrite: NEGATIVE
Protein, ur: 30 mg/dL — AB
Specific Gravity, Urine: 1.031 — ABNORMAL HIGH (ref 1.005–1.030)
pH: 6 (ref 5.0–8.0)

## 2024-04-04 MED ORDER — LIDOCAINE 5 % EX PTCH
2.0000 | MEDICATED_PATCH | CUTANEOUS | Status: DC
  Administered 2024-04-04: 2 via TRANSDERMAL
  Filled 2024-04-04: qty 2

## 2024-04-04 NOTE — Discharge Instructions (Addendum)
 You can apply lidocaine  patches to your ribs for pain.  Recheck with your primary care provider.  Return to ER for worsening or concerning symptoms. Take your hydrocodone  as needed as prescribed for pain.

## 2024-04-04 NOTE — ED Triage Notes (Signed)
 Pt via pov from [facility] after a fall in the lobby. She reports that she tripped over her feet. Denies LOC. She has pain in her lower lip and across her upper abdomen. She isn't really sure how she hit, but states that this ribcage pain is new since the fall. Pt alert & oriented, nad noted.

## 2024-04-04 NOTE — ED Provider Notes (Signed)
 [place] [facility]PARTMENT AT [facility] Provider Note   CSN: [id number] Arrival date & time: 04/04/24  1101     History  Chief Complaint  Patient presents with   Lauren [name]    Lauren [name] is a 78 y.o. female.  78 year old female with injuries from a fall. Patient was walking into the hospital to go to PT when she tripped and fell, striking her knees on the ground and did hit face/mouth on the ground. No LOC, not on thinners.  Patient's fall was witnessed by security officers nearby.  She was assisted to standing afterwards and was ambulatory however reports pain in her bilateral lower ribs and mid back area.  While she did hit her face, she denies any dental injury, has a minor wound to her right lower lip which is not bleeding and does not require closure.       Home Medications Prior to Admission medications   Medication Sig Start Date End Date Taking? Authorizing Provider  aspirin  EC 81 MG tablet Take 81 mg by mouth daily. Swallow whole.    [provider]  B Complex-C (B-COMPLEX WITH VITAMIN C) tablet Take 1 tablet by mouth daily.    [provider]  escitalopram  (LEXAPRO ) 20 MG tablet Take 1 tablet (20 mg total) by mouth daily. 03/13/24   Jasmine Mesi D, MD  FIBER GUMMIES PO Take 1-2 each by mouth See admin instructions. Take 2 gummies with breakfast and 1 gummy with dinner    [provider]  fluticasone  (FLONASE ) 50 MCG/ACT nasal spray Place 2 sprays into both nostrils daily as needed for allergies or rhinitis. 03/13/24 04/12/24  Jasmine Mesi D, MD  gabapentin  (NEURONTIN ) 300 MG capsule Take 600 mg by mouth at bedtime.  09/04/15   [provider]  HYDROcodone -acetaminophen  (NORCO) 10-325 MG tablet Take 1 tablet by mouth every 6 (six) hours as needed for moderate pain (pain score 4-6). 05/13/20   [provider]  Lancets [facility] Jewelene Morton Elizabeth Gulling) MISC  10/27/18   [provider]  Magnesium  250 MG TABS  Take 250 mg by mouth daily.    [provider]  methocarbamol  (ROBAXIN ) 500 MG tablet Take 1 tablet (500 mg total) by mouth every 6 (six) hours as needed for muscle spasms. Patient taking differently: Take 500 mg by mouth at bedtime. 12/03/17   Arnie Lao, MD  mirabegron  ER (MYRBETRIQ ) 50 MG TB24 tablet Take 1 tablet (50 mg total) by mouth daily. 03/30/24   Arma Lamp, MD  [facility] VERIO test strip  10/27/18   [provider]  pantoprazole  (PROTONIX ) 40 MG tablet Take 40 mg by mouth 2 (two) times daily.    [provider]  phenylephrine  (NEO-SYNEPHRINE) 0.25 % nasal spray Place 1 spray into both nostrils at bedtime as needed for congestion.    [provider]  polyethylene glycol powder (GLYCOLAX /MIRALAX ) 17 GM/SCOOP powder Take 1/2 dose daily. 03/13/24   Jasmine Mesi D, MD  rosuvastatin  (CRESTOR ) 20 MG tablet Take 1 tablet (20 mg total) by mouth daily. 04/03/24   Jasmine Mesi D, MD  tirzepatide  (MOUNJARO ) 7.5 MG/0.5ML Pen Inject 7.5 mg into the skin once a week. 04/03/24   Glenora Laos, MD      Allergies    Ciprofloxacin hcl, Percocet [oxycodone -acetaminophen ], Penicillins, Sulfa antibiotics, Tape, Tetanus toxoids, and Jardiance [empagliflozin]    Review of Systems   Review of Systems Negative except as per HPI Physical Exam Updated Vital Signs BP Aaron Aas)  135/109 (BP Location: Right Arm)   Pulse 71   Temp 97.8 F (36.6 C)   Resp 20   Ht 5\' 3"  (1.6 m)   Wt 81.6 kg   SpO2 100%   BMI 31.87 kg/m  Physical Exam Musculoskeletal:     Right shoulder: Normal.     Left shoulder: Normal.     Right elbow: Normal.     Left elbow: Normal.     Right wrist: Normal.     Left wrist: Normal.     Cervical back: No tenderness or bony tenderness.     Thoracic back: No tenderness or bony tenderness.     Lumbar back: No tenderness or bony tenderness.       Back:     Right knee: Normal.     Left knee: No bony tenderness or crepitus. Normal  range of motion. No tenderness.     Right ankle: Normal.     Left ankle: Normal.       Legs:     ED Results / Procedures / Treatments   Labs (all labs ordered are listed, but only abnormal results are displayed) Labs Reviewed  URINALYSIS, ROUTINE W REFLEX MICROSCOPIC - Abnormal; Notable for the following components:      Result Value   Specific Gravity, Urine 1.031 (*)    Ketones, ur TRACE (*)    Protein, ur 30 (*)    Leukocytes,Ua TRACE (*)    All other components within normal limits    EKG None  Radiology DG Ribs Unilateral W/Chest Left Result Date: 04/04/2024 CLINICAL DATA:  Fall and chest wall pain. EXAM: LEFT RIBS AND CHEST - 3+ VIEW; RIGHT RIBS AND CHEST - 3+ VIEW COMPARISON:  Chest radiograph dated 02/14/2020. FINDINGS: No focal consolidation, pleural effusion or pneumothorax. The cardiac silhouette is within limits. Atherosclerotic calcification of the aorta. No acute osseous pathology. No displaced rib fractures. IMPRESSION: 1. No active cardiopulmonary disease. 2. No displaced rib fractures. Electronically Signed   By: Angus Bark M.D.   On: 04/04/2024 13:45   DG Ribs Unilateral W/Chest Right Result Date: 04/04/2024 CLINICAL DATA:  Fall and chest wall pain. EXAM: LEFT RIBS AND CHEST - 3+ VIEW; RIGHT RIBS AND CHEST - 3+ VIEW COMPARISON:  Chest radiograph dated 02/14/2020. FINDINGS: No focal consolidation, pleural effusion or pneumothorax. The cardiac silhouette is within limits. Atherosclerotic calcification of the aorta. No acute osseous pathology. No displaced rib fractures. IMPRESSION: 1. No active cardiopulmonary disease. 2. No displaced rib fractures. Electronically Signed   By: Angus Bark M.D.   On: 04/04/2024 13:45   DG Thoracic Spine 4V Result Date: 04/04/2024 CLINICAL DATA:  Back pain following a fall. EXAM: THORACIC SPINE - 4+ VIEW COMPARISON:  None. FINDINGS: Moderate to marked multilevel degenerative changes. No fractures or subluxations. Mild scoliosis.  IMPRESSION: 1. No fracture. 2. 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 Electronically Signed   By: Catherin Closs M.D.   On: 04/04/2024 13:44   DG Lumbar Spine Complete Result Date: 04/04/2024 CLINICAL DATA:  Back pain following a fall. EXAM: LUMBAR SPINE - COMPLETE 4+ VIEW COMPARISON:  12/18/2021 FINDINGS: Five non-rib-bearing lumbar vertebrae. Moderate multilevel degenerative changes without significant change. Stable mild retrolisthesis at the L1-2 and L2-3 levels and grade 1 anterolisthesis at the L4-5 level due to facet degenerative changes. No fractures, pars defects or interval subluxations. Cholecystectomy clips. Bilateral hip prostheses. Atheromatous arterial calcifications without visible aneurysm. IMPRESSION: 1. No fracture. 2. Moderate multilevel degenerative changes, as described above. Electronically Signed  By: Catherin Closs M.D.   On: 04/04/2024 13:42   DG Knee Complete 4 Views Left Result Date: 04/04/2024 CLINICAL DATA:  Fall EXAM: LEFT KNEE - COMPLETE 4+ VIEW COMPARISON:  05/20/2020 FINDINGS: Prior right knee replacement. No joint effusion. No acute bony abnormality. Specifically, no fracture, subluxation, or dislocation. No hardware complicating feature. IMPRESSION: Right knee replacement.  No acute bony abnormality. Electronically Signed   By: Janeece Mechanic M.D.   On: 04/04/2024 13:39    Procedures Procedures    Medications Ordered in ED Medications  lidocaine  (LIDODERM ) 5 % 2 patch (has no administration in time range)    ED Course/ Medical Decision Making/ A&P                                 Medical Decision Making Amount and/or Complexity of Data Reviewed Labs: ordered. Radiology: ordered.   78 year old female presents for evaluation after a fall today.  She reports pain in her ribs and some pain in her back.  She does not have any midline or bony tenderness through her back.  X-rays were obtained to evaluate for compression fracture, interpreted by myself is  negative for acute bony abnormality, agree with radiologist dictation.  She does have a contusion to her left knee with underlying knee replacement, x-ray of this knee is negative for acute bony abnormality, agree with radiology interpretation.  X-rays of the ribs for reproducible chest wall pain are reviewed by myself and negative for displaced rib fracture or pneumothorax.  Agree with radiologist interpretation.  She is provided with Lidoderm  patches to help with her pain.  Discussed continuing with her current narcotic pain regimen at home, can use incentive spirometer or blow bubbles to encourage full lung expansion.  Recheck with PCP as needed and return to ER for any worsening or concerning symptoms.        Final Clinical Impression(s) / ED Diagnoses Final diagnoses:  Fall, initial encounter  Chest wall pain  Acute thoracic back pain, unspecified back pain laterality  Contusion of left knee, initial encounter    Rx / DC Orders ED Discharge Orders     None         Darlis Eisenmenger, PA-C 04/04/24 1357    Tegeler, Marine Sia, MD 04/04/24 1434

## 2024-04-06 ENCOUNTER — Other Ambulatory Visit (INDEPENDENT_AMBULATORY_CARE_PROVIDER_SITE_OTHER): Payer: Self-pay | Admitting: Family Medicine

## 2024-04-06 DIAGNOSIS — J3089 Other allergic rhinitis: Secondary | ICD-10-CM

## 2024-04-09 ENCOUNTER — Encounter (HOSPITAL_BASED_OUTPATIENT_CLINIC_OR_DEPARTMENT_OTHER): Payer: Self-pay

## 2024-04-09 ENCOUNTER — Ambulatory Visit (HOSPITAL_BASED_OUTPATIENT_CLINIC_OR_DEPARTMENT_OTHER): Admitting: Physical Therapy

## 2024-04-11 ENCOUNTER — Telehealth: Payer: Self-pay | Admitting: Neurology

## 2024-04-11 ENCOUNTER — Ambulatory Visit: Admitting: Neurology

## 2024-04-11 ENCOUNTER — Encounter: Payer: Self-pay | Admitting: Neurology

## 2024-04-11 VITALS — BP 118/70 | HR 79 | Ht 63.0 in | Wt 184.0 lb

## 2024-04-11 DIAGNOSIS — R251 Tremor, unspecified: Secondary | ICD-10-CM

## 2024-04-11 DIAGNOSIS — G2581 Restless legs syndrome: Secondary | ICD-10-CM

## 2024-04-11 DIAGNOSIS — G4719 Other hypersomnia: Secondary | ICD-10-CM | POA: Diagnosis not present

## 2024-04-11 DIAGNOSIS — E538 Deficiency of other specified B group vitamins: Secondary | ICD-10-CM | POA: Diagnosis not present

## 2024-04-11 DIAGNOSIS — G4761 Periodic limb movement disorder: Secondary | ICD-10-CM

## 2024-04-11 NOTE — Telephone Encounter (Signed)
 Referral for sleep studies sent through [facility] to  [facility]

## 2024-04-11 NOTE — Patient Instructions (Addendum)
 Sleep referral for Periodic Limb Movement of Sleep possibly a sleep test, they know Dr. Albertina Hugger will refer(lovely daughter is a Engineer, civil (consulting)) because husband saw her Check a thyroid  and iron studies. Checked B12 Blood work The new RLS guidelines recommend iron testing and management of medications that may cause RLS and using Gabapentin  Can consider TOMAC Nidra  Imbalace - continue PT  Medications that can worsen restless legs syndrome:  (non-sedating antihistimines: Zyrtec, Claritin and  Clarinex) or anti-nausea medications can worsen RLS  symptoms. Examples include many cold remedies and sleep  aids such as Benadryl  or Tylenol  PM.  The addition of, or an increase in, antidepressants (exceptions  include bupropion  and trazodone) or antidopaminergic  medications can worsen RLS symptoms.  Caffeine and alcohol should be reduced or discontinued, as both can aggravate RLS symptoms.   iron treatment should be continued.  Restless Legs Syndrome Restless legs syndrome is a condition that causes uncomfortable feelings or sensations in the legs, especially while sitting or lying down. The sensations usually cause an overwhelming urge to move the legs. The arms can also sometimes be affected. The condition can range from mild to severe. The symptoms often interfere with a person's ability to sleep. What are the causes? The cause of this condition is not known. What increases the risk? The following factors may make you more likely to develop this condition: Being older than 50. Pregnancy. Being a woman. In general, the condition is more common in women than in men. A family history of the condition. Having iron deficiency. Overuse of caffeine, nicotine, or alcohol. Certain medical conditions, such as kidney disease, Parkinson's disease, or nerve damage. Certain medicines, such as those for high blood pressure, nausea, colds, allergies, depression, and some heart conditions. What are the signs or  symptoms? The main symptom of this condition is uncomfortable sensations in the legs, such as: Pulling. Tingling. Prickling. Throbbing. Crawling. Burning. Usually, the sensations: Affect both sides of the body. Are worse when you sit or lie down. Are worse at night. These may make it difficult to fall asleep. Make you have a strong urge to move your legs. Are temporarily relieved by moving your legs or standing. The arms can also be affected, but this is rare. People who have this condition often have tiredness during the day because of their lack of sleep at night. How is this diagnosed? This condition may be diagnosed based on: Your symptoms. Blood tests. In some cases, you may be monitored in a sleep lab by a specialist (a sleep study). This can detect any disruptions in your sleep. How is this treated? This condition is treated by managing the symptoms. This may include: Lifestyle changes, such as exercising, using relaxation techniques, and avoiding caffeine, alcohol, or tobacco. Iron supplements. Medicines. Parkinson's medications may be tried first. Anti-seizure medications can also be helpful. Follow these instructions at home: General instructions Take over-the-counter and prescription medicines only as told by your health care provider. Use methods to help relieve the uncomfortable sensations, such as: Massaging your legs. Walking or stretching. Taking a cold or hot bath. Keep all follow-up visits. This is important. Lifestyle     Practice good sleep habits. For example, go to bed and get up at the same time every day. Most adults should get 7-9 hours of sleep each night. Exercise regularly. Try to get at least 30 minutes of exercise most days of the week. Practice ways of relaxing, such as yoga or meditation. Avoid caffeine and alcohol. Do  not use any products that contain nicotine or tobacco. These products include cigarettes, chewing tobacco, and vaping devices,  such as e-cigarettes. If you need help quitting, ask your health care provider. Where to find more information [employer] of Neurological Disorders and Stroke: [employer] Contact a health care provider if: Your symptoms get worse or they do not improve with treatment. Summary Restless legs syndrome is a condition that causes uncomfortable feelings or sensations in the legs, especially while sitting or lying down. The symptoms often interfere with your ability to sleep. This condition is treated by managing the symptoms. You may need to make lifestyle changes or take medicines. This information is not intended to replace advice given to you by your health care provider. Make sure you discuss any questions you have with your health care provider. Document Revised: 05/31/2021 Document Reviewed: 05/31/2021 Elsevier Patient Education  2024 [employer].

## 2024-04-11 NOTE — Progress Notes (Addendum)
 [facility]    Provider:  Dr Tresia Fruit Requesting Provider: Glenora Laos, MD Primary Care Provider:  Jimmey Mould, MD  CC:  Restless Leg Syndrome  HPI:  Lauren [name] is a 78 y.o. female here as requested by Glenora Laos, MD for restless leg syndrome. has Essential hypertension; Umbilical hernia; Diabetes mellitus (HCC); Hiatal hernia; Status post total replacement of right hip; Trochanteric bursitis, right hip; Status post total left knee replacement; Malignant neoplasm of upper-outer quadrant of right breast in female, estrogen receptor positive (HCC); Diabetic neuropathy, type II diabetes mellitus (HCC); Family history of breast cancer; Family history of prostate cancer; Family history of colon cancer; Family history of kidney cancer; Genetic testing; DDD (degenerative disc disease), lumbosacral; Low back pain; Unilateral primary osteoarthritis, right knee; Coronary artery calcification; Pure hypercholesterolemia; Lower extremity edema; Other fatigue; SOB (shortness of breath) on exertion; Class 2 severe obesity with serious comorbidity and body mass index (BMI) of 37.0 to 37.9 in adult [facility]); Vitamin D  deficiency; Atypical chest pain; Stress; Other constipation; OA (osteoarthritis) of knee; Status post total right knee replacement; Seasonal affective disorder (HCC); BMI 32.0-32.9,adult; Obesity, Beginning BMI 37.59; Allergic rhinitis; Depression; Unilateral primary osteoarthritis, left hip; Other hyperlipidemia; Restless leg syndrome; SUI (stress urinary incontinence, female); Status post total replacement of left hip; and Overactive bladder on their problem list.  Here with daughter who provides much information. RLS ongoing for a long time. Worsening. Hasn;t been this bad. In the last 6-8 months getting more restless. She is tired all the time and may be doing it all the time. Dad had it so badly that mother would not sleep with him. Her mother had parkinson's  disease. She I constantly fidgeting, hands shaking with action, fatigue, does not know if she snores at night but daughter states when she stayed at home she snored some, doesn't know if she wakesup at night she is on medication that helps with nocturnal awakening due to feeling like has to urinate.  Legs feel like always have to move them, worse at night, she had a fall about a week ago and she has been sleeping in her back but when watching TV in recliner always moving feet and hands. Also reports not picking feet up, tremors, imbalance.Here with daughter who also provides information she fell last week and end of may she lost her balance, she used a walker in the past after hip surgery in February and has been going to PT and is in [facility] helping with balance. Denies numbness/tinglingin the feet.   From a thorough review of records and patient report, medications tried that can be used in restless leg management includes: Clonazepam, gabapentin   Reviewed Dr. Lenward Railing notes, Nov 2024: Restless Leg Syndrome (RLS) Worsening restless leg syndrome, affecting sleep quality. No prior workup for RLS. Discussed potential causes including low iron levels and the need for a comprehensive workup by a neurologist. Discussed the potential need for a sleep study to evaluate sleep quality and diagnose any underlying conditions. - Refer to neurologist for RLS workup - Consider sleep study if recommended by neurologist   CT head 11/27/2021: reviewed images and agree with the following CLINICAL DATA:  Dizziness for 2 weeks. Worse this week. History of vertical for 40 years.   EXAM: CT HEAD WITHOUT CONTRAST   TECHNIQUE: Contiguous axial images were obtained from the base of the skull through the vertex without intravenous contrast.   RADIATION DOSE REDUCTION: This exam was performed according to the  departmental dose-optimization program which includes automated exposure control, adjustment of the mA and/or kV  according to patient size and/or use of iterative reconstruction technique.   COMPARISON:  CT brain 10/20/2019   FINDINGS: Brain: The ventricles are normal in size and configuration. The basilar cisterns are patent.   No mass, mass effect, or midline shift.   No acute intracranial hemorrhage is seen.   No abnormal extra-axial fluid collection.   Preservation of the normal cortical gray-white interface without CT evidence of an acute major vascular territorial cortical based infarction.   Vascular: No hyperdense vessel. There are intracranial atherosclerotic calcifications.   Skull: Normal. Negative for fracture or focal lesion.   Sinuses/Orbits: The visualized orbits are unremarkable. The visualized paranasal sinuses and mastoid air cells are clear.   Other: None.   IMPRESSION: No acute intracranial process.    Reviewed notes, labs and imaging from outside physicians, which showed:  Cbc 11/10/2023 showed normal Hgb/HCT no anemia12.4/37.5 06/08/2023 hgba1c abnormal pre-diabetes 6 06/2023 B12 313 low normal (2 years ago very low 104) 05/2022 tsh nml 2.150  Review of Systems: Patient complains of symptoms per HPI as well as the following symptoms back pain, imbalance, bilateral hip and knee replacements, falls. Pertinent negatives and positives per HPI. All others negative.   Social History   Socioeconomic History   Marital status: Widowed    Spouse name: Not on file   Number of children: Not on file   Years of education: Not on file   Highest education level: Not on file  Occupational History   Occupation: Retired  Tobacco Use   Smoking status: Former    Current packs/day: 0.00    Average packs/day: 1 pack/day for 10.0 years (10.0 ttl pk-yrs)    Types: Cigarettes    Start date: 09/22/1972    Quit date: 09/22/1982    Years since quitting: 41.5   Smokeless tobacco: Former    Quit date: 1983   Tobacco comments:    quit 1983  Vaping Use   Vaping status: Never  Used  Substance and Sexual Activity   Alcohol use: No   Drug use: No   Sexual activity: Not Currently    Partners: Male    Birth control/protection: Post-menopausal  Other Topics Concern   Not on file  Social History Narrative   Epworth Sleepiness Scale = 4 (as of 09/12/15)   Pt lives alone    Retired    Chief Executive Officer [employer] investment banker Strain: Not on file  Food Insecurity: Not on file  Transportation Needs: No Transportation Needs (11/09/2018)   PRAPARE - Administrator, Civil Service (Medical): No    Lack of Transportation (Non-Medical): No  Physical Activity: Not on file  Stress: Not on file  Social Connections: Not on file  Intimate Partner Violence: Not on file    Family History  Problem Relation Age of Onset   Hyperlipidemia Mother    Diabetes Mother    Hypertension Mother    Parkinson's disease Mother    Kidney disease Mother    Kidney disease Father    Stroke Father    Heart failure Father    Hypertension Father    Heart disease Father    Diabetes Father    Cancer Father        colon, prostate, and kidney   Restless legs syndrome Father    Cancer Maternal Aunt        cervical vs ovarian   Colon  cancer Paternal Aunt        dx less than 50   Breast cancer Paternal Aunt        dx in her 60s   Prostate cancer Paternal Uncle    Heart disease Maternal Grandmother    Diabetes Maternal Grandmother    Heart disease Maternal Grandfather    Diabetes Maternal Grandfather    Cancer Maternal Grandfather        pt unaware of what kind   Stroke Paternal Grandmother    Stroke Paternal Grandfather    Breast cancer Cousin        pat first cousin, dx over 45   Prostate cancer Other        PGF's father    Past Medical History:  Diagnosis Date   Anxiety    Arthritis    Back, Hip- right   Atypical chest pain 06/25/2021   Back pain    Cancer (HCC)    right breast   Cataract    Colitis    Coronary artery calcification 02/20/2020    Diabetes mellitus without complication (HCC)    Type II   Edema 03/05/2021   Edema of both lower extremities    Endometriosis    Family history of adverse reaction to anesthesia    Father - N/V - blood pressure; daughter N/V   Family history of breast cancer    Family history of colon cancer    Family history of kidney cancer    Family history of prostate cancer    Fatty liver    GERD (gastroesophageal reflux disease)    History of hiatal hernia    History of kidney stones    History of ulcerative colitis    Hypertension    Insomnia    Joint pain    Kidney problem    Lower extremity edema 03/05/2021   Lumbar disc disease    L4 L5   Neuropathy    Osteoarthritis    Other hyperlipidemia    Palpitations    Personal history of radiation therapy    Pneumonia    1983, 2003   Pure hypercholesterolemia 02/20/2020   PVCs (premature ventricular contractions)    benign   Umbilical hernia    Vitamin D  deficiency    Wears glasses     Patient Active Problem List   Diagnosis Date Noted   Overactive bladder 02/22/2024   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/2023   SUI (stress urinary incontinence, female) 11/22/2023   Restless leg syndrome 09/22/2023   Other hyperlipidemia 06/08/2023   Unilateral primary osteoarthritis, left hip 05/11/2023   Depression 03/16/2023   Allergic rhinitis 12/29/2022   Seasonal affective disorder (HCC) 12/01/2022   BMI 32.0-32.9,adult 12/01/2022   Obesity, Beginning BMI 37.59 12/01/2022   OA (osteoarthritis) of knee 08/03/2022   Status post total right knee replacement 08/03/2022   Stress 07/19/2022   Other constipation 07/19/2022   Atypical chest pain 06/25/2021   Class 2 severe obesity with serious comorbidity and body mass index (BMI) of 37.0 to 37.9 in adult (HCC) 06/03/2021   Vitamin D  deficiency 06/03/2021   Other fatigue 04/22/2021   SOB (shortness of breath) on exertion 04/22/2021   Lower extremity edema 03/05/2021   Coronary  artery calcification 02/20/2020   Pure hypercholesterolemia 02/20/2020   Unilateral primary osteoarthritis, right knee 01/03/2019   Genetic testing 12/07/2018   Family history of breast cancer    Family history of prostate cancer    Family history of  colon cancer    Family history of kidney cancer    Malignant neoplasm of upper-outer quadrant of right breast in female, 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/09/2018   Diabetic neuropathy, type II diabetes mellitus (HCC) 11/09/2018   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/02/2017   Trochanteric bursitis, right hip 05/10/2017   Status post total replacement of right hip 11/26/2016   DDD (degenerative disc disease), 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   Low back pain 04/23/2015   Hiatal hernia 04/13/2013   Diabetes mellitus (HCC) 01/26/2013   Umbilical hernia 07/24/2012   Essential hypertension     Past Surgical History:  Procedure Laterality Date   APPENDECTOMY     BREAST BIOPSY  08-13-2009  DR TSUEI   EXCISION LEFT NIPPLE DUCT   BREAST EXCISIONAL BIOPSY Left    x2   BREAST EXCISIONAL BIOPSY Right    BREAST LUMPECTOMY Right 11/16/2018   invasive ductal   CHOLECYSTECTOMY  1992   COLON RESECTION  1986   ULCERATIVE COLITIS   COLON SURGERY     ESOPHAGEAL MANOMETRY N/A 06/11/2013   Procedure: ESOPHAGEAL MANOMETRY (EM);  Surgeon: Mathew Solomon, MD;  Location: [facility];  Service: Endoscopy;  Laterality: N/A;   ESOPHAGOGASTRODUODENOSCOPY (EGD) WITH PROPOFOL  N/A 05/31/2016   Procedure: ESOPHAGOGASTRODUODENOSCOPY (EGD) WITH PROPOFOL ;  Surgeon: Ozell Blunt, MD;  Location: [facility] END[facility]COPY;  Service: Endoscopy;  Laterality: N/A;   EXCISION RIGHT BREAST MASS   10-20-2005  DR Donata Fryer YOUNG   EYE SURGERY Bilateral 2023   cataract   FRACTURE SURGERY     left arm   kidney stone removed  12/2009   KNEE ARTHROSCOPY Left    MCL   LEFT THUMB CARPOMETACARPAL JOINT SUSPENSIONPLASTY  04-06-2006  DR Donzella Galley   LEFT WRIST ARTHROSCOPY W/ DEBRIDEMENT AND REMOVAL  CYST  03-26-2004  DR Donzella Galley   MYOMECTOMY     RADIOACTIVE SEED GUIDED PARTIAL MASTECTOMY WITH AXILLARY SENTINEL LYMPH NODE BIOPSY Right 11/16/2018   Procedure: RIGHT BREAST RADIOACTIVE SEED GUIDED PARTIAL MASTECTOMY WITH  SENTINEL  NODE BIOPSY;  Surgeon: Oza Blumenthal, MD;  Location: [facility] OR;  Service: General;  Laterality: Right;   RIGHT COLECTOMY     RIGHT URETEROSCOPIC STONE EXTRACTION  12-11-2009  DR Autry Legions [facility]   TENDON RELEASE     TONSILLECTOMY     TOTAL HIP ARTHROPLASTY Right 11/26/2016   Procedure: RIGHT TOTAL HIP ARTHROPLASTY ANTERIOR APPROACH;  Surgeon: Arnie Lao, MD;  Location: [facility];  Service: Orthopedics;  Laterality: Right;   TOTAL HIP ARTHROPLASTY Left 12/06/2023   Procedure: LEFT TOTAL HIP ARTHROPLASTY ANTERIOR APPROACH;  Surgeon: Arnie Lao, MD;  Location: [facility] OR;  Service: Orthopedics;  Laterality: Left;   TOTAL KNEE ARTHROPLASTY Left 12/02/2017   Procedure: LEFT TOTAL KNEE ARTHROPLASTY;  Surgeon: Arnie Lao, MD;  Location: [facility];  Service: Orthopedics;  Laterality: Left;   TOTAL KNEE ARTHROPLASTY Right 08/03/2022   Procedure: RIGHT TOTAL KNEE ARTHROPLASTY;  Surgeon: Arnie Lao, MD;  Location: [facility] OR;  Service: Orthopedics;  Laterality: Right;   WRIST SURGERY     both    Current Outpatient Medications  Medication Sig Dispense Refill   aspirin  EC 81 MG tablet Take 81 mg by mouth daily. Swallow whole.     B Complex-C (B-COMPLEX WITH VITAMIN C) tablet Take 1 tablet by mouth daily.     escitalopram  (LEXAPRO ) 20 MG tablet Take 1 tablet (20 mg total) by mouth daily. 90 tablet 0   FIBER GUMMIES PO Take 1-2 each by mouth See admin  instructions. Take 2 gummies with breakfast and 1 gummy with dinner     fluticasone  (FLONASE ) 50 MCG/ACT nasal spray Place 2 sprays into both nostrils daily as needed for allergies or rhinitis. 9.9 mL 0   gabapentin  (NEURONTIN ) 300 MG capsule Take 600 mg by mouth at bedtime.   1    HYDROcodone -acetaminophen  (NORCO) 10-325 MG tablet Take 1 tablet by mouth every 6 (six) hours as needed for moderate pain (pain score 4-6).     Lancets (ONETOUCH DELICA PLUS LANCET33G) MISC      Magnesium  250 MG TABS Take 250 mg by mouth daily.     methocarbamol  (ROBAXIN ) 500 MG tablet Take 1 tablet (500 mg total) by mouth every 6 (six) hours as needed for muscle spasms. (Patient taking differently: Take 500 mg by mouth at bedtime.) 60 tablet 0   mirabegron  ER (MYRBETRIQ ) 50 MG TB24 tablet Take 1 tablet (50 mg total) by mouth daily. 90 tablet 3   ONETOUCH VERIO test strip      pantoprazole  (PROTONIX ) 40 MG tablet Take 40 mg by mouth 2 (two) times daily.     phenylephrine  (NEO-SYNEPHRINE) 0.25 % nasal spray Place 1 spray into both nostrils at bedtime as needed for congestion.     polyethylene glycol powder (GLYCOLAX /MIRALAX ) 17 GM/SCOOP powder Take 1/2 dose daily. (Patient taking differently: as needed. Take 1/2 dose daily.) 3350 g 1   rosuvastatin  (CRESTOR ) 20 MG tablet Take 1 tablet (20 mg total) by mouth daily. 30 tablet 0   tirzepatide  (MOUNJARO ) 7.5 MG/0.5ML Pen Inject 7.5 mg into the skin once a week. 2 mL 0   No current facility-administered medications for this visit.    Allergies as of 04/11/2024 - Review Complete 04/11/2024  Allergen Reaction Noted   Ciprofloxacin hcl Hives 11/12/2016   Percocet [oxycodone -acetaminophen ] Other (See Comments) 05/28/2011   Penicillins Hives and Other (See Comments) 05/28/2011   Sulfa antibiotics Hives 09/22/2012   Tape Other (See Comments) 05/28/2011   Tetanus toxoids Hives and Rash 05/28/2011   Jardiance [empagliflozin]  05/29/2019    Vitals: BP 118/70   Pulse 79   Ht 5' 3 (1.6 m)   Wt 184 lb (83.5 kg)   BMI 32.59 kg/m  Last Weight:  Wt Readings from Last 1 Encounters:  04/11/24 184 lb (83.5 kg)   Last Height:   Ht Readings from Last 1 Encounters:  04/11/24 5' 3 (1.6 m)     Physical exam: Exam: Gen: NAD, conversant, well  nourised, obese, well groomed                     CV: RRR, no MRG. No Carotid Bruits. No peripheral edema, warm, nontender Eyes: Conjunctivae clear without exudates or hemorrhage  Neuro: Detailed Neurologic Exam  Speech:    Speech is normal; fluent and spontaneous with normal comprehension.  Cognition:    The patient is oriented to person, place, and time;     recent and remote memory intact;     language fluent;     normal attention, concentration,     fund of knowledge Cranial Nerves:    The pupils are equal, round, and reactive to light. Attempted, pupils to osmall to visualize fundi. Visual fields are full to finger confrontation. Extraocular movements are intact. Trigeminal sensation is intact and the muscles of mastication are normal. The face is symmetric. The palate elevates in the midline. Hearing intact. Voice is normal. Shoulder shrug is normal. The tongue has normal motion without fasciculations.  Coordination:    Normal finger to nose  Gait: Not shuffling but walks slowly with some imbalance, cautious  Motor Observation:    No asymmetry, no atrophy, high amplitude low freq postural and action tremor Tone:    Normal muscle tone.    Posture:    Posture is normal. normal erect    Strength:    Strength is intact/non focal in the upper and lower limbs.      Sensation: intact to LT, pin prick and vibratoon distally in the limbs     Reflex Exam:  DTR's:    Deep tendon reflexes in the upper and lower extremities are  slightly briak for age and medical conditions bilat, Ajs 1+ Toes:    The toes are equiv bilaterally.   Clonus:    Clonus is absent.    Assessment/Plan: Lovely patient and daughter here for restless leg syndrome. Also reports imbalance, falls, tremor. has Essential hypertension; Umbilical hernia; Diabetes mellitus (HCC); Hiatal hernia; Status post total replacement of right hip; Trochanteric bursitis, right hip; Status post total left knee replacement;  Malignant neoplasm of upper-outer quadrant of right breast in female, estrogen receptor positive (HCC); Diabetic neuropathy, type II diabetes mellitus (HCC); Family history of breast cancer; Family history of prostate cancer; Family history of colon cancer; Family history of kidney cancer; Genetic testing; DDD (degenerative disc disease), lumbosacral; Low back pain; Unilateral primary osteoarthritis, right knee; Coronary artery calcification; Pure hypercholesterolemia; Lower extremity edema; Other fatigue; SOB (shortness of breath) on exertion; Class 2 severe obesity with serious comorbidity and body mass index (BMI) of 37.0 to 37.9 in adult [facility]); Vitamin D  deficiency; Atypical chest pain; Stress; Other constipation; OA (osteoarthritis) of knee; Status post total right knee replacement; Seasonal affective disorder (HCC); BMI 32.0-32.9,adult; Obesity, Beginning BMI 37.59; Allergic rhinitis; Depression; Unilateral primary osteoarthritis, left hip; Other hyperlipidemia; Restless leg syndrome; SUI (stress urinary incontinence, female); Status post total replacement of left hip; and Overactive bladder on their problem list.  DX: RLS/PLMS/Fatigue:  Sleep referral for Periodic Limb Movement of Sleep possibly a sleep test, they love Dr. Albertina Hugger will refer(lovely daughter is a Engineer, civil (consulting)) because husband saw Dr. Albertina Hugger (passed away, Pzd0 Check a thyroid  and iron studies. Checked B12 Blood work  The new restless leg guidelines do not recommend the use of dopamine agonists.  These guidelines recommend checking ferritin/iron levels, minimizing medications that can exacerbate restless leg syndrome and utilizing medications such as gabapentin  and Lyrica as first-line use.  There is also a new device on the market which I have limited experience with Tomac Nidra.  Can consider TOMAC Nidra Can increase Gabapentin  in the future however may cause increased sedation and more risk of falls  Denies neuropathy, sensory exam intact  to light touch, pin prick and vibration  Reviewed:Medications that can worsen restless legs syndrome:  (non-sedating antihistimines: Zyrtec, Claritin and  Clarinex) or anti-nausea medications can worsen RLS  symptoms. Examples include many cold remedies and sleep  aids such as Benadryl  or Tylenol  PM.  The addition of, or an increase in, antidepressants (exceptions  include bupropion  and trazodone) or antidopaminergic  medications can worsen RLS symptoms.  Caffeine and alcohol should be reduced or discontinued, as both can aggravate RLS symptoms.   iron treatment should be considered. I  DX: Imbalance and gait abnormality Likely as a result of  bilateral hip and knee replacements, low back pain and hip pain and she sees Dr. Vaughn Georges at [facility].  Tremor is postural and action as opposed to  resting, no overt parkinsonian signs on exam. continue PT, Discuss walking aids with pt since recent falls Fall precations Can consider life alert since live home independently  Dx; B12 deficiancy - check b12 and MMA last was low-normal prefer to be over 400   Orders Placed This Encounter  Procedures   Iron, TIBC and Ferritin Panel   TSH Rfx on Abnormal to Free T4   B12 and Folate Panel   Methylmalonic acid, serum   Ambulatory referral to Sleep Studies   No orders of the defined types were placed in this encounter.   Cc: Glenora Laos, MD,  Jimmey Mould, MD  Aldona Amel, MD  [facility] [address] [place], [place] [address]  Phone [phone] Fax [phone]  I spent 65 minutes of face-to-face and non-face-to-face time with patient on the  1. RLS (restless legs syndrome)   2. B12 deficiency   3. Periodic limb movement disorder (PLMD)   4. Excessive daytime sleepiness   5. Tremor    diagnosis.  This included previsit chart review, lab review, study review, order entry, electronic health record documentation, patient education on the  different diagnostic and therapeutic options, counseling and coordination of care, risks and benefits of management, compliance, or risk factor reduction

## 2024-04-12 ENCOUNTER — Ambulatory Visit (HOSPITAL_BASED_OUTPATIENT_CLINIC_OR_DEPARTMENT_OTHER): Admitting: Physical Therapy

## 2024-04-12 ENCOUNTER — Ambulatory Visit: Payer: Self-pay | Admitting: Neurology

## 2024-04-12 NOTE — Progress Notes (Signed)
 Ferritin is very low, 26. We want it over 75 in restless leg syndrome and this could be a reason for RLS worsening. I recommend taking daily iron supplement with 50-100mg  of vitamin C which helps iron absorb. We can always recheck your ferritin and if not improved we can discuss other ways to increase the ferritin. Thank you

## 2024-04-16 DIAGNOSIS — Z6831 Body mass index (BMI) 31.0-31.9, adult: Secondary | ICD-10-CM | POA: Diagnosis not present

## 2024-04-16 DIAGNOSIS — E1169 Type 2 diabetes mellitus with other specified complication: Secondary | ICD-10-CM | POA: Diagnosis not present

## 2024-04-16 DIAGNOSIS — R2689 Other abnormalities of gait and mobility: Secondary | ICD-10-CM | POA: Diagnosis not present

## 2024-04-16 DIAGNOSIS — I959 Hypotension, unspecified: Secondary | ICD-10-CM | POA: Diagnosis not present

## 2024-04-16 LAB — B12 AND FOLATE PANEL
Folate: >20 ng/mL (ref >3.0)
Vitamin B-12: 444 pg/mL (ref 232–1245)

## 2024-04-16 LAB — IRON,TIBC AND FERRITIN PANEL
Ferritin: 26 ng/mL (ref 15–150)
Iron Saturation: 21 % (ref 15–55)
Iron: 70 ug/dL (ref 27–139)
Total Iron Binding Capacity: 340 ug/dL (ref 250–450)
UIBC: 270 ug/dL (ref 118–369)

## 2024-04-16 LAB — TSH RFX ON ABNORMAL TO FREE T4: TSH: 2.45 u[IU]/mL (ref 0.450–4.500)

## 2024-04-16 LAB — METHYLMALONIC ACID, SERUM: Methylmalonic Acid: 243 nmol/L (ref 0–378)

## 2024-04-16 NOTE — Progress Notes (Signed)
 Hi Lauren [name], your ferritin level is low and this can be causing worsening of yur restless legs. I recommend taking a daily iron supplement along with Vitamin C 50-100mg (which helps it absorb) and hopefully this helps!! I would take it long term for now daily to get your iron stores up. Let us  know if you have any questions

## 2024-04-17 ENCOUNTER — Other Ambulatory Visit: Payer: Self-pay

## 2024-04-17 ENCOUNTER — Ambulatory Visit: Admitting: Sports Medicine

## 2024-04-17 ENCOUNTER — Ambulatory Visit (HOSPITAL_BASED_OUTPATIENT_CLINIC_OR_DEPARTMENT_OTHER): Admitting: Physical Therapy

## 2024-04-17 ENCOUNTER — Encounter: Payer: Self-pay | Admitting: Sports Medicine

## 2024-04-17 ENCOUNTER — Ambulatory Visit

## 2024-04-17 DIAGNOSIS — M7611 Psoas tendinitis, right hip: Secondary | ICD-10-CM | POA: Diagnosis not present

## 2024-04-17 DIAGNOSIS — G8929 Other chronic pain: Secondary | ICD-10-CM | POA: Diagnosis not present

## 2024-04-17 DIAGNOSIS — Z96641 Presence of right artificial hip joint: Secondary | ICD-10-CM

## 2024-04-17 DIAGNOSIS — M25551 Pain in right hip: Secondary | ICD-10-CM

## 2024-04-17 NOTE — Progress Notes (Signed)
 RORI GOAR - 78 y.o. female MRN [id number]  Date of birth: 1946/02/16  Office Visit Note: Visit Date: 04/17/2024 PCP: Jimmey Mould, MD Referred by: Jimmey Mould, MD  Subjective: Chief Complaint  Patient presents with   Right Hip - Pain   HPI: Lauren [name] is a pleasant 78 y.o. female who presents today for chronic right anterior hip pain in setting of prior THA.  Lauren [name] has had chronic right anterior hip pain following right hip replacement in January 2018.  Her pain is localized to the anterior hip pointing to the inguinal crease near the psoas tendon.  Over 1 year ago we did perform an ultrasound-guided psoas sheath injection which gave her essentially 100% relief of her pain for the first 1-2 weeks.  She is significantly bothered by this pain and is interested in possible iliopsoas tendon release if thinks this would be beneficial.  She is on chronic hydrocodone  10-325 mg 3 times daily for her chronic pain.  Pertinent ROS were reviewed with the patient and found to be negative unless otherwise specified above in HPI.   Assessment & Plan: Visit Diagnoses:  1. 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 Psoas tendinitis of right side    Plan: Impression is chronic and progressive right anterior hip pain in the setting of prior THA.  Her symptoms seem to emanate from the iliopsoas tendon and over 1 year ago she had essentially 100% relief of her pain after an ultrasound-guided psoas tendon sheath injection.  She is interested in anything that can be done to relieve her symptoms, we discussed iliopsoas tendon release with Dr. Hermina Loosen.  Before we were to ascertain this, we will plan for an ultrasound-guided iliopsoas tendon sheath injection and I did give her instructions shortly after today's injection to perform hip flexion exercises to see what percentage of relief she has as well as if she has any residual weakness after the injection.  She  does have follow-up with Dr. Lucienne Ryder in 1 week to reevaluate and reassess how she is doing and for further discussion on if he thinks she would be a good candidate for psoas tendon release as well.  She essentially has failed all conservative treatments with previous injections, multiple rounds of physical therapy, dry needling, extracorporeal shockwave therapy.  Follow-up: Return for has f/u with Lucienne Ryder next week .   Meds & Orders: No orders of the defined types were placed in this encounter.   Orders Placed This Encounter  Procedures   US  Guided Needle Placement - No Linked Charges     Procedures: US -guided Iliopsoas Tendon Sheath Injection, Right Hip: After discussion on risk/benefits/indications, an informed verbal consent was obtained. A timeout was then performed. The patient was lying supine on examination table with the affected leg relaxed in neutral position. The area overlying the groin and psoas tendon was prepped with ChloraPrep and multiple alcohol swabs. The ultrasound probe was placed in an oblique plane parallel to the inguinal ligament and superior to femoral head. The overlying soft tissue was anesthesized with 5 cc of lidocaine  1%. Using ultrasound guidance via an in-plane approach, a 22-gauge, 3.5 needle was inserted from a lateral to medial direction into the iliopsoas tendon sheath between the tendon and ilium. The tendon sheath was then injected with a mixture of 1:2:1cc of lidocaine :bupivicaine:celestone . Appropriate spread of the injectate within the tendon sheath was visualized with ultrasound guidance. Patient tolerated the procedure well without immediate complications.  A Band-Aid was then applied.          Clinical History: No specialty comments available.  She reports that she quit smoking about 41 years ago. Her smoking use included cigarettes. She started smoking about 51 years ago. She has a 10 pack-year smoking history. She quit smokeless tobacco use  about 42 years ago.  Recent Labs    06/08/23 0941  HGBA1C 6.0*    Objective:    Physical Exam  Gen: Well-appearing, in no acute distress; non-toxic CV: Well-perfused. Warm.  Resp: Breathing unlabored on room air; no wheezing. Psych: Fluid speech in conversation; appropriate affect; normal thought process  Ortho Exam - Right hip: There is no redness swelling or effusion.  There is tenderness to palpation with deep palpation within the inguinal crease near the psoas tendon.  There is weakness with associated pain with hip flexion, positive Stinchfield test and psoas heel slide.  There is a well-healed incision over the anterior lateral hip from prior THA.  Imaging: No results found.  Past Medical/Family/Surgical/Social History: Medications & Allergies reviewed per EMR, new medications updated. Patient Active Problem List   Diagnosis Date Noted   Overactive bladder 02/22/2024   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/2023   SUI (stress urinary incontinence, female) 11/22/2023   Restless leg syndrome 09/22/2023   Other hyperlipidemia 06/08/2023   Unilateral primary osteoarthritis, left hip 05/11/2023   Depression 03/16/2023   Allergic rhinitis 12/29/2022   Seasonal affective disorder (HCC) 12/01/2022   BMI 32.0-32.9,adult 12/01/2022   Obesity, Beginning BMI 37.59 12/01/2022   OA (osteoarthritis) of knee 08/03/2022   Status post total right knee replacement 08/03/2022   Stress 07/19/2022   Other constipation 07/19/2022   Atypical chest pain 06/25/2021   Class 2 severe obesity with serious comorbidity and body mass index (BMI) of 37.0 to 37.9 in adult (HCC) 06/03/2021   Vitamin D  deficiency 06/03/2021   Other fatigue 04/22/2021   SOB (shortness of breath) on exertion 04/22/2021   Lower extremity edema 03/05/2021   Coronary artery calcification 02/20/2020   Pure hypercholesterolemia 02/20/2020   Unilateral primary osteoarthritis, right knee 01/03/2019   Genetic  testing 12/07/2018   Family history of breast cancer    Family history of prostate cancer    Family history of colon cancer    Family history of kidney cancer    Malignant neoplasm of upper-outer quadrant of right breast in female, 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/09/2018   Diabetic neuropathy, type II diabetes mellitus (HCC) 11/09/2018   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/02/2017   Trochanteric bursitis, right hip 05/10/2017   Status post total replacement of right hip 11/26/2016   DDD (degenerative disc disease), 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   Low back pain 04/23/2015   Hiatal hernia 04/13/2013   Diabetes mellitus (HCC) 01/26/2013   Umbilical hernia 07/24/2012   Essential hypertension    Past Medical History:  Diagnosis Date   Anxiety    Arthritis    Back, Hip- right   Atypical chest pain 06/25/2021   Back pain    Cancer (HCC)    right breast   Cataract    Colitis    Coronary artery calcification 02/20/2020   Diabetes mellitus without complication (HCC)    Type II   Edema 03/05/2021   Edema of both lower extremities    Endometriosis    Family history of adverse reaction to anesthesia    Father - N/V - blood pressure; daughter N/V   Family history of  breast cancer    Family history of colon cancer    Family history of kidney cancer    Family history of prostate cancer    Fatty liver    GERD (gastroesophageal reflux disease)    History of hiatal hernia    History of kidney stones    History of ulcerative colitis    Hypertension    Insomnia    Joint pain    Kidney problem    Lower extremity edema 03/05/2021   Lumbar disc disease    L4 L5   Neuropathy    Osteoarthritis    Other hyperlipidemia    Palpitations    Personal history of radiation therapy    Pneumonia    1983, 2003   Pure hypercholesterolemia 02/20/2020   PVCs (premature ventricular contractions)    benign   Umbilical hernia    Vitamin D  deficiency    Wears glasses    Family History   Problem Relation Age of Onset   Hyperlipidemia Mother    Diabetes Mother    Hypertension Mother    Parkinson's disease Mother    Kidney disease Mother    Kidney disease Father    Stroke Father    Heart failure Father    Hypertension Father    Heart disease Father    Diabetes Father    Cancer Father        colon, prostate, and kidney   Restless legs syndrome Father    Cancer Maternal Aunt        cervical vs ovarian   Colon cancer Paternal Aunt        dx less than 50   Breast cancer Paternal Aunt        dx in her 56s   Prostate cancer Paternal Uncle    Heart disease Maternal Grandmother    Diabetes Maternal Grandmother    Heart disease Maternal Grandfather    Diabetes Maternal Grandfather    Cancer Maternal Grandfather        pt unaware of what kind   Stroke Paternal Grandmother    Stroke Paternal Grandfather    Breast cancer Cousin        pat first cousin, dx over 56   Prostate cancer Other        PGF's father   Past Surgical History:  Procedure Laterality Date   APPENDECTOMY     BREAST BIOPSY  08-13-2009  DR TSUEI   EXCISION LEFT NIPPLE DUCT   BREAST EXCISIONAL BIOPSY Left    x2   BREAST EXCISIONAL BIOPSY Right    BREAST LUMPECTOMY Right 11/16/2018   invasive ductal   CHOLECYSTECTOMY  1992   COLON RESECTION  1986   ULCERATIVE COLITIS   COLON SURGERY     ESOPHAGEAL MANOMETRY N/A 06/11/2013   Procedure: ESOPHAGEAL MANOMETRY (EM);  Surgeon: Mathew Solomon, MD;  Location: [facility];  Service: Endoscopy;  Laterality: N/A;   ESOPHAGOGASTRODUODENOSCOPY (EGD) WITH PROPOFOL  N/A 05/31/2016   Procedure: ESOPHAGOGASTRODUODENOSCOPY (EGD) WITH PROPOFOL ;  Surgeon: Ozell Blunt, MD;  Location: [facility] ENDOSCOPY;  Service: Endoscopy;  Laterality: N/A;   EXCISION RIGHT BREAST MASS   10-20-2005  DR Donata Fryer YOUNG   EYE SURGERY Bilateral 2023   cataract   FRACTURE SURGERY     left arm   kidney stone removed  12/2009   KNEE ARTHROSCOPY Left    MCL   LEFT THUMB CARPOMETACARPAL JOINT  SUSPENSIONPLASTY  04-06-2006  DR Donzella Galley   LEFT WRIST ARTHROSCOPY W/ DEBRIDEMENT AND REMOVAL CYST  03-26-2004  DR Donzella Galley   MYOMECTOMY     RADIOACTIVE SEED GUIDED PARTIAL MASTECTOMY WITH AXILLARY SENTINEL LYMPH NODE BIOPSY Right 11/16/2018   Procedure: RIGHT BREAST RADIOACTIVE SEED GUIDED PARTIAL MASTECTOMY WITH  SENTINEL  NODE BIOPSY;  Surgeon: Oza Blumenthal, MD;  Location: [facility] OR;  Service: General;  Laterality: Right;   RIGHT COLECTOMY     RIGHT URETEROSCOPIC STONE EXTRACTION  12-11-2009  DR Autry [facility]   TENDON RELEASE     TONSILLECTOMY     TOTAL HIP ARTHROPLASTY Right 11/26/2016   Procedure: RIGHT TOTAL HIP ARTHROPLASTY ANTERIOR APPROACH;  Surgeon: Arnie Lao, MD;  Location: [facility];  Service: Orthopedics;  Laterality: Right;   TOTAL HIP ARTHROPLASTY Left 12/06/2023   Procedure: LEFT TOTAL HIP ARTHROPLASTY ANTERIOR APPROACH;  Surgeon: Arnie Lao, MD;  Location: [facility] OR;  Service: Orthopedics;  Laterality: Left;   TOTAL KNEE ARTHROPLASTY Left 12/02/2017   Procedure: LEFT TOTAL KNEE ARTHROPLASTY;  Surgeon: Arnie Lao, MD;  Location: [facility];  Service: Orthopedics;  Laterality: Left;   TOTAL KNEE ARTHROPLASTY Right 08/03/2022   Procedure: RIGHT TOTAL KNEE ARTHROPLASTY;  Surgeon: Arnie Lao, MD;  Location: [facility] OR;  Service: Orthopedics;  Laterality: Right;   WRIST SURGERY     both   Social History   Occupational History   Occupation: Retired  Tobacco Use   Smoking status: Former    Current packs/day: 0.00    Average packs/day: 1 pack/day for 10.0 years (10.0 ttl pk-yrs)    Types: Cigarettes    Start date: 09/22/1972    Quit date: 09/22/1982    Years since quitting: 41.5   Smokeless tobacco: Former    Quit date: 1983   Tobacco comments:    quit 1983  Vaping Use   Vaping status: Never Used  Substance and Sexual Activity   Alcohol use: No   Drug use: No   Sexual activity: Not Currently    Partners: Male    Birth  control/protection: Post-menopausal

## 2024-04-18 ENCOUNTER — Encounter: Payer: Self-pay | Admitting: Sports Medicine

## 2024-04-19 ENCOUNTER — Ambulatory Visit

## 2024-04-19 ENCOUNTER — Ambulatory Visit (HOSPITAL_BASED_OUTPATIENT_CLINIC_OR_DEPARTMENT_OTHER): Admitting: Physical Therapy

## 2024-04-20 ENCOUNTER — Telehealth: Payer: Self-pay | Admitting: Neurology

## 2024-04-20 NOTE — Telephone Encounter (Signed)
 Pt asked this appointment be r/s, it was too close to recent appointment with Dr Tresia Fruit

## 2024-04-23 ENCOUNTER — Encounter: Payer: Self-pay | Admitting: Orthopaedic Surgery

## 2024-04-23 ENCOUNTER — Other Ambulatory Visit: Payer: Self-pay

## 2024-04-23 ENCOUNTER — Other Ambulatory Visit (INDEPENDENT_AMBULATORY_CARE_PROVIDER_SITE_OTHER)

## 2024-04-23 ENCOUNTER — Ambulatory Visit: Attending: Orthopedic Surgery

## 2024-04-23 ENCOUNTER — Ambulatory Visit: Admitting: Orthopaedic Surgery

## 2024-04-23 DIAGNOSIS — M62838 Other muscle spasm: Secondary | ICD-10-CM | POA: Diagnosis not present

## 2024-04-23 DIAGNOSIS — Z96642 Presence of left artificial hip joint: Secondary | ICD-10-CM

## 2024-04-23 DIAGNOSIS — M25552 Pain in left hip: Secondary | ICD-10-CM | POA: Insufficient documentation

## 2024-04-23 DIAGNOSIS — R293 Abnormal posture: Secondary | ICD-10-CM | POA: Diagnosis not present

## 2024-04-23 DIAGNOSIS — R279 Unspecified lack of coordination: Secondary | ICD-10-CM | POA: Diagnosis not present

## 2024-04-23 DIAGNOSIS — M25551 Pain in right hip: Secondary | ICD-10-CM | POA: Diagnosis not present

## 2024-04-23 DIAGNOSIS — M6281 Muscle weakness (generalized): Secondary | ICD-10-CM | POA: Diagnosis not present

## 2024-04-23 DIAGNOSIS — M5459 Other low back pain: Secondary | ICD-10-CM | POA: Diagnosis not present

## 2024-04-23 NOTE — Therapy (Signed)
 OUTPATIENT PHYSICAL THERAPY FEMALE PELVIC EVALUATION   Patient Name: Lauren [name] MRN: [id number] DOB:09-28-1946, 78 y.o., female Today's Date: 04/23/2024  END OF SESSION:  PT End of Session - 04/23/24 1232     Visit Number 1   #1 pelvic floor physical therapy   Date for PT Re-Evaluation 07/16/24    Authorization Type [employer] $10 copay    Progress Note Due on Visit 20    PT Start Time 1230    PT Stop Time 1318    PT Time Calculation (min) 48 min    Activity Tolerance Patient tolerated treatment well    Behavior During Therapy [facility] for tasks assessed/performed          Past Medical History:  Diagnosis Date   Anxiety    Arthritis    Back, Hip- right   Atypical chest pain 06/25/2021   Back pain    Cancer (HCC)    right breast   Cataract    Colitis    Coronary artery calcification 02/20/2020   Diabetes mellitus without complication (HCC)    Type II   Edema 03/05/2021   Edema of both lower extremities    Endometriosis    Family history of adverse reaction to anesthesia    Father - N/V - blood pressure; daughter N/V   Family history of breast cancer    Family history of colon cancer    Family history of kidney cancer    Family history of prostate cancer    Fatty liver    GERD (gastroesophageal reflux disease)    History of hiatal hernia    History of kidney stones    History of ulcerative colitis    Hypertension    Insomnia    Joint pain    Kidney problem    Lower extremity edema 03/05/2021   Lumbar disc disease    L4 L5   Neuropathy    Osteoarthritis    Other hyperlipidemia    Palpitations    Personal history of radiation therapy    Pneumonia    1983, 2003   Pure hypercholesterolemia 02/20/2020   PVCs (premature ventricular contractions)    benign   Umbilical hernia    Vitamin D  deficiency    Wears glasses    Past Surgical History:  Procedure Laterality Date   APPENDECTOMY     BREAST BIOPSY  08-13-2009  DR TSUEI   EXCISION LEFT NIPPLE  DUCT   BREAST EXCISIONAL BIOPSY Left    x2   BREAST EXCISIONAL BIOPSY Right    BREAST LUMPECTOMY Right 11/16/2018   invasive ductal   CHOLECYSTECTOMY  1992   COLON RESECTION  1986   ULCERATIVE COLITIS   COLON SURGERY     ESOPHAGEAL MANOMETRY N/A 06/11/2013   Procedure: ESOPHAGEAL MANOMETRY (EM);  Surgeon: Oliva FORBES Boots, MD;  Location: [facility];  Service: Endoscopy;  Laterality: N/A;   ESOPHAGOGASTRODUODENOSCOPY (EGD) WITH PROPOFOL  N/A 05/31/2016   Procedure: ESOPHAGOGASTRODUODENOSCOPY (EGD) WITH PROPOFOL ;  Surgeon: Oliva Boots, MD;  Location: [facility] ENDOSCOPY;  Service: Endoscopy;  Laterality: N/A;   EXCISION RIGHT BREAST MASS   10-20-2005  DR MAUDE YOUNG   EYE SURGERY Bilateral 2023   cataract   FRACTURE SURGERY     left arm   kidney stone removed  12/2009   KNEE ARTHROSCOPY Left    MCL   LEFT THUMB CARPOMETACARPAL JOINT SUSPENSIONPLASTY  04-06-2006  DR SISSY   LEFT WRIST ARTHROSCOPY W/ DEBRIDEMENT AND REMOVAL CYST  03-26-2004  DR SISSY  MYOMECTOMY     RADIOACTIVE SEED GUIDED PARTIAL MASTECTOMY WITH AXILLARY SENTINEL LYMPH NODE BIOPSY Right 11/16/2018   Procedure: RIGHT BREAST RADIOACTIVE SEED GUIDED PARTIAL MASTECTOMY WITH  SENTINEL  NODE BIOPSY;  Surgeon: Vernetta Berg, MD;  Location: [facility] OR;  Service: General;  Laterality: Right;   RIGHT COLECTOMY     RIGHT URETEROSCOPIC STONE EXTRACTION  12-11-2009  DR NORLEEN [facility]   TENDON RELEASE     TONSILLECTOMY     TOTAL HIP ARTHROPLASTY Right 11/26/2016   Procedure: RIGHT TOTAL HIP ARTHROPLASTY ANTERIOR APPROACH;  Surgeon: Lonni CINDERELLA Vernetta, MD;  Location: [facility];  Service: Orthopedics;  Laterality: Right;   TOTAL HIP ARTHROPLASTY Left 12/06/2023   Procedure: LEFT TOTAL HIP ARTHROPLASTY ANTERIOR APPROACH;  Surgeon: Vernetta Lonni CINDERELLA, MD;  Location: [facility] OR;  Service: Orthopedics;  Laterality: Left;   TOTAL KNEE ARTHROPLASTY Left 12/02/2017   Procedure: LEFT TOTAL KNEE ARTHROPLASTY;  Surgeon: Vernetta Lonni CINDERELLA, MD;   Location: [facility];  Service: Orthopedics;  Laterality: Left;   TOTAL KNEE ARTHROPLASTY Right 08/03/2022   Procedure: RIGHT TOTAL KNEE ARTHROPLASTY;  Surgeon: Vernetta Lonni CINDERELLA, MD;  Location: [facility] OR;  Service: Orthopedics;  Laterality: Right;   WRIST SURGERY     both   Patient Active Problem List   Diagnosis Date Noted   Overactive bladder 02/22/2024   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/2023   SUI (stress urinary incontinence, female) 11/22/2023   Restless leg syndrome 09/22/2023   Other hyperlipidemia 06/08/2023   Unilateral primary osteoarthritis, left hip 05/11/2023   Depression 03/16/2023   Allergic rhinitis 12/29/2022   Seasonal affective disorder (HCC) 12/01/2022   BMI 32.0-32.9,adult 12/01/2022   Obesity, Beginning BMI 37.59 12/01/2022   OA (osteoarthritis) of knee 08/03/2022   Status post total right knee replacement 08/03/2022   Stress 07/19/2022   Other constipation 07/19/2022   Atypical chest pain 06/25/2021   Class 2 severe obesity with serious comorbidity and body mass index (BMI) of 37.0 to 37.9 in adult (HCC) 06/03/2021   Vitamin D  deficiency 06/03/2021   Other fatigue 04/22/2021   SOB (shortness of breath) on exertion 04/22/2021   Lower extremity edema 03/05/2021   Coronary artery calcification 02/20/2020   Pure hypercholesterolemia 02/20/2020   Unilateral primary osteoarthritis, right knee 01/03/2019   Genetic testing 12/07/2018   Family history of breast cancer    Family history of prostate cancer    Family history of colon cancer    Family history of kidney cancer    Malignant neoplasm of upper-outer quadrant of right breast in female, 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/09/2018   Diabetic neuropathy, type II diabetes mellitus (HCC) 11/09/2018   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/02/2017   Trochanteric bursitis, right hip 05/10/2017   Status post total replacement of right hip 11/26/2016   DDD (degenerative disc disease),  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   Low back pain 04/23/2015   Hiatal hernia 04/13/2013   Diabetes mellitus (HCC) 01/26/2013   Umbilical hernia 07/24/2012   Essential hypertension     PCP: Okey Carlin Redbird, MD  REFERRING PROVIDER: Marilynne Rosaline SAILOR, MD   REFERRING DIAG: N32.81 (ICD-10-CM) - Overactive bladder N39.3 (ICD-10-CM) - SUI (stress urinary incontinence, female)  THERAPY DIAG:  Other muscle spasm  Muscle weakness (generalized)  Abnormal posture  Unspecified lack of coordination  Other low back pain  Pain in right hip  Pain in left hip  Rationale for Evaluation and Treatment: Rehabilitation   ONSET DATE: 1 year  SUBJECTIVE:  SUBJECTIVE STATEMENT: Pt states that she has been having more difficulty with bladder; she has always had issues with stress urinary incontinence. She is now leaking just all the time. When she went to see primary care last December he told her she may just have to wear pads for the rest of her life. She saw urogynecologist after that and was diagnosed with overactive bladder. Myrbetriq  has been very helpful.    PAIN:  Are you having pain? Yes NPRS scale: 7/10 at worst; 3/10 currently Pain location: low back pain; Lt hip soreness; Rt iliopsoas issue   Pain type: aching Pain description: intermittent   Aggravating factors: lifting Rt LE Relieving factors: pain medication 3x/day (would love to get off of)  PRECAUTIONS: None  RED FLAGS: None   WEIGHT BEARING RESTRICTIONS: No  FALLS:  Has patient fallen in last 6 months? Yes. Number of falls 1 fall, 3 weeks ago  OCCUPATION: Geophysical data processor   ACTIVITY LEVEL : not currently exercising   PLOF: Independent  PATIENT GOALS: to get better control over bladder  PERTINENT HISTORY:  Appendectomy,  Rt breast lumpectomy, colon surgery, cholecystectomy, Lt knee arthroscopy to repair MCL, myomectomy, Rt partial mastectomy 2020, Rt colectomy, Rt THA 2018, Lt THA 2025, Lt TKA 2019, Rt TKA 2023, umbilical hernia  BOWEL MOVEMENT: Pain with bowel movement: No Type of bowel movement:Frequency 1-2x/day and Strain yes Fully empty rectum: Yes: mostly Leakage: No Pads: Yes: see below  Fiber supplement/laxative Yes - fiber gummies   URINATION: Pain with urination: No Fully empty bladder: No - not always, some post-void dribbling or she walks around with feeling that there is still a drop or two still there (almost feels like she needs to have an orgasm) Stream: WNL Urgency: has mostly gone away Frequency: usually no trips at night anymore; every 2 hours during the day  Fluid Intake:  Leakage: Coughing, Sneezing, Laughing, Lifting, and Bending forward Pads: Yes: usually, but not more than a spot or two  INTERCOURSE:  Not sexually active anymore - widow as of 3 years ago  PREGNANCY: Vaginal deliveries 2 Tearing No Episiotomy No C-section deliveries 0 Currently pregnant No  PROLAPSE: None   OBJECTIVE:  Note: Objective measures were completed at Evaluation unless otherwise noted.   PATIENT SURVEYS:   PFIQ-7: 3  COGNITION: Overall cognitive status: Within functional limits for tasks assessed     SENSATION: Light touch: Appears intact   FUNCTIONAL TESTS:  Squat: bil UE support Single leg stance:  Rt: pelvic drop with UE support  Lt: pelvic drop with UE support Curl-up test: distortion   GAIT: Assistive device utilized: None Comments: bil trendelenburg   POSTURE: rounded shoulders, forward head, decreased lumbar lordosis, increased thoracic kyphosis, posterior pelvic tilt, and Rt posterior rotation   LUMBARAROM/PROM:  A/PROM A/PROM  Eval (% available)  Flexion 50  Extension 50  Right lateral flexion 50, pain  Left lateral flexion 50  Right rotation 50  Left  rotation 50   (Blank rows = not tested)  PALPATION:   General: tightness throughout bil lumbar paraspinals; bil scar tissue restriction in hip incisions  Pelvic Alignment: Rt posterior rotation   Abdominal: apical breathing pattern, decreased rib cage mobility                 External Perineal Exam: red and dry  Internal Pelvic Floor: no superficial discomfort, mild discomfort in Lt deep layers, significant tightness and pain in deep Rt pelvic floor/obturator/piriformis  Patient confirms identification and approves PT to assess internal pelvic floor and treatment Yes  PELVIC MMT:   MMT eval  Vaginal 3/5, 2 second endurance, 5 repeat contraction  Diastasis Recti 2-3 finger width separation   (Blank rows = not tested)        TONE: High, Rt>Lt  PROLAPSE: Grade 1 anterior vaginal wall laxity  TODAY'S TREATMENT:                                                                                                                              DATE:  03/07/24  EVAL  Neuromuscular re-education: Butterfly 10 breaths Standing child's pose 10 breaths Standing cat cow 5x Diaphragmatic breathing  Lower trunk rotation  Therapeutic activities: Squatty potty/relaxed toilet position Exhaling with pushing Getting suction cup to start working on Rt hip at home     PATIENT EDUCATION:  Education details: See above Person educated: Patient Education method: Programmer, multimedia, Facilities manager, Actor cues, Verbal cues, and Handouts Education comprehension: verbalized understanding  HOME EXERCISE PROGRAM: [id number]  ASSESSMENT:  CLINICAL IMPRESSION: Patient is a 78 y.o. female who was seen today for physical therapy evaluation and treatment for stress urinary incontinence, constipation, and low back pain. Exam findings notable for abnormal posture, decreased low back A/ROM, hip scar tissue restriction, decreased ability to perform single leg stance and has bil pelvic  drop, decreased rob cage mobility with breathing, apical breathing pattern, adductor restriction (Rt>Lt), significant Rt pelvic floor muscle tone with pain and mild tone and discomfort on Lt, pelvic floor muscle weakness and decreased endurance, and mild vaginal wall laxity. Signs and symptoms are most consistent with pelvic floor muscle spasm and pain, pelvic floor muscle weakness and decreased endurance, and scar tissue restriction in hips/abdomen.Initial treatment consisted of diaphragmatic breathing, down training exercises, and optimizing position for having bowel movement. She will continue to benefit from skilled PT intervention in order to decrease stress urinary incontinence, improve constipation, decrease low back/hip pain, address impairments, and improve quality of life.   OBJECTIVE IMPAIRMENTS: decreased activity tolerance, decreased coordination, decreased endurance, decreased mobility, decreased ROM, decreased strength, increased fascial restrictions, increased muscle spasms, impaired flexibility, impaired tone, improper body mechanics, postural dysfunction, and pain.   ACTIVITY LIMITATIONS: lifting, bending, and continence  PARTICIPATION LIMITATIONS: cleaning, laundry, and community activity  PERSONAL FACTORS: 1-2 comorbidities: medical history are also affecting patient's functional outcome.   REHAB POTENTIAL: Good  CLINICAL DECISION MAKING: Evolving/moderate complexity  EVALUATION COMPLEXITY: Moderate   GOALS: Goals reviewed with patient? Yes  SHORT TERM GOALS: Target date: 05/21/2024   Pt will be independent with HEP.   Baseline: Goal status: INITIAL  2.  Pt will decrease pain by 25% in order to be able to perform regular exercise program to decrease urinary incontinence and pain.  Baseline:  Goal status: INITIAL  3.  Pt  will have no tenderness in bil levator ani in order to be able to ambulate and move low back/pelvis in daily activities without pain.  Baseline:   Goal status: INITIAL  4.  Pt will increase pelvic floor muscle strength to 4/5 in order to decrease urinary incontinence and pad use.  Baseline:  Goal status: INITIAL  5.  Pt will be independent with the knack, urge suppression technique, and double voiding in order to improve bladder habits and decrease urinary incontinence.   Baseline:  Goal status: INITIAL  6.  Pt will be independent with use of squatty potty, relaxed toileting mechanics, and improved bowel movement techniques in order to increase ease of bowel movements and complete evacuation.   Baseline:  Goal status: INITIAL  LONG TERM GOALS: Target date: 07/16/24  Pt will be independent with advanced HEP.   Baseline:  Goal status: INITIAL  2.  Pt will decrease pain by 75% in order to be able to perform regular exercise program to decrease urinary incontinence and pain.  Baseline:  Goal status: INITIAL  3.  Pt will increase pelvic floor muscle endurance to greater than 10 seconds in order to decrease pad use to 0 a day.  Baseline:  Goal status: INITIAL  4.  Pt will report no leaks with laughing, coughing, sneezing, bending, and lifting in order to improve comfort with interpersonal relationships and community activities.   Baseline:  Goal status: INITIAL  5.  Pt will be able to lift 10lb kettle bell 5 times without pain or urinary incontinence.  Baseline:  Goal status: INITIAL  6.  Pt will be able to stand on single LE without pelvic drop in order to improve fall risk, decrease low back pain, and help maintain progress with urinary incontinence.  Baseline:  Goal status: INITIAL  PLAN:  PT FREQUENCY: 1-2x/week  PT DURATION: 6 months  PLANNED INTERVENTIONS: 97110-Therapeutic exercises, 97530- Therapeutic activity, 97112- Neuromuscular re-education, 97535- Self Care, [id number]- Manual therapy, Dry Needling, and Biofeedback  PLAN FOR NEXT SESSION: teach the knack and double voiding; internal pelvic floor muscle  release to tolerance; teach negative pressure soft tissue mobilization with suction cup to scar tissue on Rt hip; manual techniques to scar tissue    Josette Mares, PT, DPT06/23/251:42 PM

## 2024-04-23 NOTE — Progress Notes (Signed)
 The patient is a 78 year old female who is well-known to us .  We recently replaced her left hip about 5 months ago in February this year secondary to arthritis.  Her right hip was replaced back in 2018.  She has had both her knees replaced.  She recently had a iliopsoas tendon injection under ultrasound by Dr. Burnetta with her right hip.  She has had this in the past about a year ago.  It has helped her significantly but she still has some pain.  I believe some of this is from just walking differently for some of the years dealing with all the different joint replacements that she has had.  She is not walking with assistive device.  She did have a fall recently landing on both of her knees.  Her right and left hips move smoothly and fluidly.  The right hip does have some pain over the iliopsoas tendon with the extreme of flexion.  An AP pelvis shows bilateral total hip arthroplasties that are well-seated with no complicating features.  I would like her to still mobilize as much she can and I am not recommending any type of surgical intervention until she has been out further from her left hip replacement to see how things come down with the right side.  With that being said we will see her back in 3 months for repeat exam but no x-rays are needed.  She agrees to this treatment plan.

## 2024-04-23 NOTE — Patient Instructions (Signed)
Squatty potty: When your knees are level or below the level of your hips, pelvic floor muscles are pressed against rectum, preventing ease of bowel movement. By getting knees above the level of the hips, these pelvic floor muscles relax, allowing easier passage of bowel movement.  Ways to get knees above hips: o Squatty Potty (7inch and 9inch versions) o Small stool o Roll of toilet paper under each foot o Hardback book or stack of magazines under each foot  Relaxed Toileting mechanics: Once in this position, make sure to lean forward with forearms on thighs, wide knees, relaxed stomach, and breathe.   [facility] [address] [place], [place] [address] Phone # [phone] Fax [phone]

## 2024-05-01 ENCOUNTER — Encounter (INDEPENDENT_AMBULATORY_CARE_PROVIDER_SITE_OTHER): Payer: Self-pay | Admitting: Family Medicine

## 2024-05-01 ENCOUNTER — Ambulatory Visit (INDEPENDENT_AMBULATORY_CARE_PROVIDER_SITE_OTHER): Admitting: Family Medicine

## 2024-05-01 VITALS — BP 130/78 | HR 77 | Temp 98.0°F | Ht 63.0 in | Wt 180.0 lb

## 2024-05-01 DIAGNOSIS — E669 Obesity, unspecified: Secondary | ICD-10-CM

## 2024-05-01 DIAGNOSIS — Z7985 Long-term (current) use of injectable non-insulin antidiabetic drugs: Secondary | ICD-10-CM | POA: Diagnosis not present

## 2024-05-01 DIAGNOSIS — Z6831 Body mass index (BMI) 31.0-31.9, adult: Secondary | ICD-10-CM | POA: Diagnosis not present

## 2024-05-01 DIAGNOSIS — E611 Iron deficiency: Secondary | ICD-10-CM | POA: Diagnosis not present

## 2024-05-01 DIAGNOSIS — R296 Repeated falls: Secondary | ICD-10-CM

## 2024-05-01 DIAGNOSIS — E1140 Type 2 diabetes mellitus with diabetic neuropathy, unspecified: Secondary | ICD-10-CM

## 2024-05-01 DIAGNOSIS — E119 Type 2 diabetes mellitus without complications: Secondary | ICD-10-CM | POA: Diagnosis not present

## 2024-05-01 DIAGNOSIS — Z6832 Body mass index (BMI) 32.0-32.9, adult: Secondary | ICD-10-CM

## 2024-05-01 MED ORDER — TIRZEPATIDE 7.5 MG/0.5ML ~~LOC~~ SOAJ: 7.5000 mg | SUBCUTANEOUS | 0 refills | Status: DC

## 2024-05-01 NOTE — Progress Notes (Signed)
 Office: [phone]  /  Fax: [phone]  WEIGHT SUMMARY AND BIOMETRICS  Anthropometric Measurements Height: 5' 3 (1.6 m) Weight: 180 lb (81.6 kg) BMI (Calculated): 31.89 Weight at Last Visit: 180 lb Weight Lost Since Last Visit: 0 Weight Gained Since Last Visit: 00 Starting Weight: 219 lb Total Weight Loss (lbs): 39 lb (17.7 kg) Peak Weight: 222 lb   Body Composition  Body Fat %: 48.1 % Fat Mass (lbs): 87 lbs Muscle Mass (lbs): 89 lbs Total Body Water  (lbs): 74.4 lbs Visceral Fat Rating : 15   Other Clinical Data Fasting: No Labs: No Today's Visit #: 37 Starting Date: 04/22/21    Chief Complaint: OBESITY    History of Present Illness Lauren [name] is a 78 year old female with obesity who presents for a follow-up on her obesity treatment and progress.  She is following a category two eating plan, adhering to it about fifty percent of the time. She is attempting to cook more at home and has purchased a skillet for healthier cooking. However, she finds lunch challenging as she tends to snack instead of having a proper meal.  She experienced a hypoglycemic episode due to a malfunctioning medication pen, leading to symptoms of low blood sugar such as feeling hot, weak, and shaky. She managed these symptoms by consuming french fries. Her blood sugar typically stays between 80-85 mg/dL, and her last J8r was 4.2%.  She has experienced falls, one in June and another in April, resulting in bruised ribs and knees. She was wearing Crocs and flip-flops at the time of her falls and has since stopped wearing them. She is cautious about her footwear choices now.  She is taking iron supplements due to low ferritin levels, which were at 26 ng/mL. She takes three gummy iron supplements daily and monitors for constipation.  Her family history includes Alzheimer's and dementia on her husband's side, with both of his parents affected. She is scheduled for a sleep study  at the end of the month to evaluate her sleep patterns.      PHYSICAL EXAM:  Blood pressure 130/78, pulse 77, temperature 98 F (36.7 C), height 5' 3 (1.6 m), weight 180 lb (81.6 kg), SpO2 95%. Body mass index is 31.89 kg/m.  DIAGNOSTIC DATA REVIEWED:  BMET    Component Value Date/Time   NA 130 (L) 12/07/2023 0736   NA 132 (L) 06/08/2023 0941   K 3.4 (L) 12/07/2023 0736   CL 95 (L) 12/07/2023 0736   CO2 25 12/07/2023 0736   GLUCOSE 118 (H) 12/07/2023 0736   BUN 15 12/07/2023 0736   BUN 14 06/08/2023 0941   CREATININE 0.97 12/07/2023 0736   CREATININE 0.87 06/08/2022 1439   CALCIUM  8.7 (L) 12/07/2023 0736   GFRNONAA 60 (L) 12/07/2023 0736   GFRNONAA >60 06/08/2022 1439   GFRAA >60 02/04/2020 1212   GFRAA >60 11/09/2018 1405   Lab Results  Component Value Date   HGBA1C 6.0 (H) 06/08/2023   HGBA1C 6.7 (H) 11/16/2016   Lab Results  Component Value Date   INSULIN  25.6 (H) 06/08/2023   INSULIN  22.3 04/22/2021   Lab Results  Component Value Date   TSH 2.450 04/11/2024   CBC    Component Value Date/Time   WBC 10.9 (H) 11/30/2023 1347   RBC 4.25 11/30/2023 1347   HGB 12.4 11/30/2023 1347   HGB 12.3 06/08/2022 1439   HGB 11.8 05/11/2022 0954   HCT 37.5 11/30/2023 1347   HCT 35.1 05/11/2022  0954   PLT 327 11/30/2023 1347   PLT 272 06/08/2022 1439   PLT 265 05/11/2022 0954   MCV 88.2 11/30/2023 1347   MCV 87 05/11/2022 0954   MCH 29.2 11/30/2023 1347   MCHC 33.1 11/30/2023 1347   RDW 13.3 11/30/2023 1347   RDW 13.2 05/11/2022 0954   Iron Studies    Component Value Date/Time   IRON 70 04/11/2024 1208   TIBC 340 04/11/2024 1208   FERRITIN 26 04/11/2024 1208   IRONPCTSAT 21 04/11/2024 1208   Lipid Panel     Component Value Date/Time   CHOL 123 06/08/2023 0941   TRIG 75 06/08/2023 0941   HDL 67 06/08/2023 0941   LDLCALC 41 06/08/2023 0941   Hepatic Function Panel     Component Value Date/Time   PROT 6.7 06/08/2023 0941   ALBUMIN  4.3 06/08/2023  0941   AST 19 06/08/2023 0941   AST 16 06/08/2022 1439   ALT 16 06/08/2023 0941   ALT 12 06/08/2022 1439   ALKPHOS 58 06/08/2023 0941   BILITOT 0.4 06/08/2023 0941   BILITOT 0.4 06/08/2022 1439   BILIDIR <0.1 (L) 07/30/2015 1338   IBILI NOT CALCULATED 07/30/2015 1338      Component Value Date/Time   TSH 2.450 04/11/2024 1208   TSH 2.150 05/11/2022 0954   Nutritional Lab Results  Component Value Date   VD25OH 57.5 02/07/2024   VD25OH 124.0 (H) 09/22/2023   VD25OH 100.0 06/08/2023     Assessment and Plan Assessment & Plan Type 2 Diabetes Mellitus Blood glucose levels are well-controlled, with readings typically between 80-85 mg/dL. Recent HbA1c was 5.7%. Discussed potential hypoglycemia due to medication and the importance of regular eating habits. Advised to carry glucose tablets for rapid correction of hypoglycemia. - Monitor blood glucose levels regularly. - Maintain regular eating schedule to prevent hypoglycemia. - Carry glucose tablets for emergencies.  Obesity Adheres to category two eating plan 50% of the time and has maintained weight. Engages in pelvic floor physical therapy weekly. Attempts to cook more at home and is mindful of calorie intake, but struggles with snacking. Experienced Mounjaro  pen malfunction, possibly causing hypoglycemia. Discussed maintaining glucose reserves and contacting pharmacy and manufacturer for pen replacement. - Continue category two eating plan. - Continue pelvic floor physical therapy. - Contact pharmacy and manufacturer regarding Mounjaro  pen malfunction. - Carry glucose tablets for hypoglycemia. - Refill Mounjaro  prescription for 30 days.  Fall Risk Experienced multiple falls, one causing bruised ribs. Working on core strength through physical therapy to improve balance. Discussed potential use of a Life Alert device for safety and advised against wearing flip-flops or Crocs. - Continue pelvic floor physical therapy focusing on  core strength. - Consider using a Life Alert device for fall detection. - Avoid wearing flip-flops or Crocs.  Iron Deficiency Ferritin level is 26, below the target of 75. Advised daily iron supplementation and discussed potential constipation, recommending a stool softener if needed. - Continue daily iron supplementation. - Consider stool softener if constipation occurs.  General Health Maintenance Engaged in health management through diet, exercise, and regular check-ups. Aware of insurance benefits and manages medication refills accordingly. - Continue regular medical check-ups. - Manage medication refills to maximize insurance benefits.  Follow-up Scheduled for follow-up in September to ensure continuity of care and medication management. - Schedule follow-up appointment in September.     I have personally spent 47 minutes total time today in preparation, patient care, and documentation for this visit, including the following: review of  clinical lab tests; review of medical history, review of fall prevention and nutritional counseling.   She was informed of the importance of frequent follow up visits to maximize her success with intensive lifestyle modifications for her multiple health conditions.    Louann Penton, MD

## 2024-05-08 DIAGNOSIS — M25552 Pain in left hip: Secondary | ICD-10-CM | POA: Diagnosis not present

## 2024-05-08 DIAGNOSIS — M47817 Spondylosis without myelopathy or radiculopathy, lumbosacral region: Secondary | ICD-10-CM | POA: Diagnosis not present

## 2024-05-08 DIAGNOSIS — M150 Primary generalized (osteo)arthritis: Secondary | ICD-10-CM | POA: Diagnosis not present

## 2024-05-08 DIAGNOSIS — M25551 Pain in right hip: Secondary | ICD-10-CM | POA: Diagnosis not present

## 2024-05-09 ENCOUNTER — Ambulatory Visit: Admitting: Adult Health

## 2024-05-24 ENCOUNTER — Ambulatory Visit: Admitting: Neurology

## 2024-05-24 ENCOUNTER — Encounter: Payer: Self-pay | Admitting: Neurology

## 2024-05-24 ENCOUNTER — Ambulatory Visit

## 2024-05-24 VITALS — BP 129/74 | HR 73 | Ht 63.0 in | Wt 183.8 lb

## 2024-05-24 DIAGNOSIS — E1140 Type 2 diabetes mellitus with diabetic neuropathy, unspecified: Secondary | ICD-10-CM | POA: Diagnosis not present

## 2024-05-24 DIAGNOSIS — G619 Inflammatory polyneuropathy, unspecified: Secondary | ICD-10-CM | POA: Diagnosis not present

## 2024-05-24 DIAGNOSIS — G2581 Restless legs syndrome: Secondary | ICD-10-CM

## 2024-05-24 DIAGNOSIS — Z7985 Long-term (current) use of injectable non-insulin antidiabetic drugs: Secondary | ICD-10-CM | POA: Diagnosis not present

## 2024-05-24 DIAGNOSIS — Z8639 Personal history of other endocrine, nutritional and metabolic disease: Secondary | ICD-10-CM | POA: Diagnosis not present

## 2024-05-24 NOTE — Patient Instructions (Signed)
 78 -year- old female  here with:     1) Lauren [name] describes restless legs setting on and only later in life around the time of her 70s per staff.  She was diagnosed with diabetes earlier, and she now has nerve pain which may be diabetic as well.  She also has chronic back problems lower back problems and these can cause restless legs 2.   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     Plan : I think I would like to have an inflammatory marker profile . She had low ferritin levels and was placed on oral iron. ( 04-11-2024) I would also like to add ferritin and total iron binding capacity to this profile.   I would offer some medication gabapentin  is usually an effective way but the patient is already on it for other reasons so we may offer Lyrica instead as this slightly more effective way of treating restless legs.   I do like magnesium  at night but it helps mostly spasticity and cramping and not so much restless legs.   I noted that the patient is taking a narcotic pain medication for her lower back pain this usually is a very effective way of treating restless legs but comes with a lot of negative package.  I would love to order a sleep study in the sleep lab for her if she is willing and we can also see if her restless legs transfer into the night and if she has any restless leg activity or periodic limb movements in dream sleep.     PSG , baseline, special attention to PLMs. Lyrica 50 mg bid discussed but we may have to first maximize gabapentin .  Neuropathy panel was partially done,  I added a SPEP.      I plan to follow up either personally or through our NP within 6 months.

## 2024-05-24 NOTE — Progress Notes (Signed)
 SLEEP MED[facility]INE CLINIC    Provider:  Dedra Gores, MD  Primary Care Physician:  Okey Carlin Redbird, MD [address] [place] [address]     Referring Provider: Ines Onetha NOVAK, Md [address] [place],  [place] [address]          Chief Complaint according to patient   Patient presents with:     New Patient (Initial Visit)           HISTORY OF PRESENT ILLNESS:  Lauren [name] is a 78 y.o. female patient who is seen upon Dr Sharion referral on 05/24/2024  for a sleep consult . I had seen her late husband in 2015.   Chief concern according to patient :    I have RLS        Sleep relevant medical history: I am moving my legs when at rest, and by the evening they are incessantly moving-  I have only tried magnesium , without relief.  I have no cramps only creepy- crawly -sensations and my father had it, my parents slept in separate beds for that reason. My RLS started around the age of 60,  may have followed the onset of DM in my sixties, and take now mounjaro . I take gabapentin  for  nerve pain- and hydrocodone  for back pain.     Family medical /sleep history: RLS in late father, RLS in son.  both children have MS.  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  Family status is widowed , with 2 adult children, 5 grandchildren.  Pets are  present: [employer]   Tobacco use: none.  ETOH use ; 2 a year,  Caffeine intake in form of Coffee( decaff) Soda( /) Tea ( /) no energy drinks Exercise in form of 2000 steps or less. PT balance issue. Had falls. .     Sleep habits are as follows: The patient's dinner time is between  PM. The patient goes to bed at 10.30 PM and continues to read, asleep by 11. 30 PM, for 6-7 hours, wakes for 1 bathroom breaks, the first time at 2-3 AM.   The preferred sleep position is supine , with the support of 2 pillows.  Dreams are reportedly infrequent/vivid. The  patient wakes up with the dog alarm. 6.30   AM is the usual rise time.  She reports not feeling refreshed or restored in AM, with symptoms such as dry mouth, no morning headaches.  Naps are taken frequently,  naps again before lunch- lasting from 30-60 minutes and again 4 Pm- 4.30 pm- are not refreshing.  Fatigue !!!    Review of Systems: Out of a complete 14 system review, the patient complains of only the following symptoms, and all other reviewed systems are negative.:  Fatigue, sleepiness , snoring, RLS  How likely are you to doze in the following situations: 0 = not likely, 1 = slight chance, 2 = moderate chance, 3 = high chance   Sitting and Reading? Watching Television? Sitting inactive in a public place (theater or meeting)? As a passenger in a car for an hour without a break? Lying down in the afternoon when circumstances permit? Sitting and talking to someone? Sitting quietly after lunch without alcohol? In a car, while stopped for a few minutes in traffic?   Total = 10/ 24 points   FSS endorsed at 36/ 63 points.  GDS 2/ 15  Social History   Socioeconomic History   Marital status: Widowed    Spouse name: Not on file   Number of children: Not on file   Years of education: Not on file   Highest education level: Not on file  Occupational History   Occupation: Retired  Tobacco Use   Smoking status: Former    Current packs/day: 0.00    Average packs/day: 1 pack/day for 10.0 years (10.0 ttl pk-yrs)    Types: Cigarettes    Start date: 09/22/1972    Quit date: 09/22/1982    Years since quitting: 41.6   Smokeless tobacco: Former    Quit date: 1983   Tobacco comments:    quit 1983  Vaping Use   Vaping status: Never Used  Substance and Sexual Activity   Alcohol use: No   Drug use: No   Sexual activity: Not Currently    Partners: Male    Birth control/protection: Post-menopausal  Other Topics Concern   Not on file  Social History Narrative   Epworth Sleepiness  Scale = 4 (as of 09/12/15)   Pt lives alone    Retired    Chief Executive Officer [employer] Strain: Not on file  Food Insecurity: Not on file  Transportation Needs: No Transportation Needs (11/09/2018)   PRAPARE - Administrator, Civil Service (Medical): No    Lack of Transportation (Non-Medical): No  Physical Activity: Not on file  Stress: Not on file  Social Connections: Not on file    Family History  Problem Relation Age of Onset   Hyperlipidemia Mother    Diabetes Mother    Hypertension Mother    Parkinson's disease Mother    Kidney disease Mother    Kidney disease Father    Stroke Father    Heart failure Father    Hypertension Father    Heart disease Father    Diabetes Father    Cancer Father        colon, prostate, and kidney   Restless legs syndrome Father    Cancer Maternal Aunt        cervical vs ovarian   Colon cancer Paternal Aunt        dx less than 50   Breast cancer Paternal Aunt        dx in her 35s   Prostate cancer Paternal Uncle    Heart disease Maternal Grandmother    Diabetes Maternal Grandmother    Heart disease Maternal Grandfather    Diabetes Maternal Grandfather    Cancer Maternal Grandfather        pt unaware of what kind   Stroke Paternal Grandmother    Stroke Paternal Grandfather    Breast cancer Cousin        pat first cousin, dx over 31   Prostate cancer Other        PGF's father    Past Medical History:  Diagnosis Date   Anxiety    Arthritis    Back, Hip- right   Atypical chest pain 06/25/2021   Back pain    Cancer (HCC)    right breast   Cataract    Colitis    Coronary artery calcification 02/20/2020   Diabetes mellitus without complication (HCC)    Type II   Edema 03/05/2021   Edema of both lower extremities    Endometriosis    Family history of adverse reaction to anesthesia    Father - N/V - blood pressure; daughter  N/V   Family history of breast cancer    Family history of colon cancer     Family history of kidney cancer    Family history of prostate cancer    Fatty liver    GERD (gastroesophageal reflux disease)    History of hiatal hernia    History of kidney stones    History of ulcerative colitis    Hypertension    Insomnia    Joint pain    Kidney problem    Lower extremity edema 03/05/2021   Lumbar disc disease    L4 L5   Neuropathy    Osteoarthritis    Other hyperlipidemia    Palpitations    Personal history of radiation therapy    Pneumonia    1983, 2003   Pure hypercholesterolemia 02/20/2020   PVCs (premature ventricular contractions)    benign   Umbilical hernia    Vitamin D  deficiency    Wears glasses     Past Surgical History:  Procedure Laterality Date   APPENDECTOMY     BREAST BIOPSY  08-13-2009  DR TSUEI   EXCISION LEFT NIPPLE DUCT   BREAST EXCISIONAL BIOPSY Left    x2   BREAST EXCISIONAL BIOPSY Right    BREAST LUMPECTOMY Right 11/16/2018   invasive ductal   CHOLECYSTECTOMY  1992   COLON RESECTION  1986   ULCERATIVE COLITIS   COLON SURGERY     ESOPHAGEAL MANOMETRY N/A 06/11/2013   Procedure: ESOPHAGEAL MANOMETRY (EM);  Surgeon: Oliva FORBES Boots, MD;  Location: [facility];  Service: Endoscopy;  Laterality: N/A;   ESOPHAGOGASTRODUODENOSCOPY (EGD) WITH PROPOFOL  N/A 05/31/2016   Procedure: ESOPHAGOGASTRODUODENOSCOPY (EGD) WITH PROPOFOL ;  Surgeon: Oliva Boots, MD;  Location: [facility] ENDOSCOPY;  Service: Endoscopy;  Laterality: N/A;   EXCISION RIGHT BREAST MASS   10-20-2005  DR MAUDE YOUNG   EYE SURGERY Bilateral 2023   cataract   FRACTURE SURGERY     left arm   kidney stone removed  12/2009   KNEE ARTHROSCOPY Left    MCL   LEFT THUMB CARPOMETACARPAL JOINT SUSPENSIONPLASTY  04-06-2006  DR SISSY   LEFT WRIST ARTHROSCOPY W/ DEBRIDEMENT AND REMOVAL CYST  03-26-2004  DR SISSY   MYOMECTOMY     RADIOACTIVE SEED GUIDED PARTIAL MASTECTOMY WITH AXILLARY SENTINEL LYMPH NODE BIOPSY Right 11/16/2018   Procedure: RIGHT BREAST RADIOACTIVE SEED  GUIDED PARTIAL MASTECTOMY WITH  SENTINEL  NODE BIOPSY;  Surgeon: Vernetta Berg, MD;  Location: [facility] OR;  Service: General;  Laterality: Right;   RIGHT COLECTOMY     RIGHT URETEROSCOPIC STONE EXTRACTION  12-11-2009  DR [facility]   TENDON RELEASE     TONSILLECTOMY     TOTAL HIP ARTHROPLASTY Right 11/26/2016   Procedure: RIGHT TOTAL HIP ARTHROPLASTY ANTERIOR APPROACH;  Surgeon: Lonni CINDERELLA Vernetta, MD;  Location: [facility] ORS;  Service: Orthopedics;  Laterality: Right;   TOTAL HIP ARTHROPLASTY Left 12/06/2023   Procedure: LEFT TOTAL HIP ARTHROPLASTY ANTERIOR APPROACH;  Surgeon: Vernetta Lonni CINDERELLA, MD;  Location: [facility] OR;  Service: Orthopedics;  Laterality: Left;   TOTAL KNEE ARTHROPLASTY Left 12/02/2017   Procedure: LEFT TOTAL KNEE ARTHROPLASTY;  Surgeon: Vernetta Lonni CINDERELLA, MD;  Location: [facility];  Service: Orthopedics;  Laterality: Left;   TOTAL KNEE ARTHROPLASTY Right 08/03/2022   Procedure: RIGHT TOTAL KNEE ARTHROPLASTY;  Surgeon: Vernetta Lonni CINDERELLA, MD;  Location: [facility] OR;  Service: Orthopedics;  Laterality: Right;   WRIST SURGERY     both     Current Outpatient Medications on File Prior  to Visit  Medication Sig Dispense Refill   aspirin  EC 81 MG tablet Take 81 mg by mouth daily. Swallow whole.     B Complex-C (B-COMPLEX WITH VITAMIN C) tablet Take 1 tablet by mouth daily.     escitalopram  (LEXAPRO ) 20 MG tablet Take 1 tablet (20 mg total) by mouth daily. 90 tablet 0   FIBER GUMMIES PO Take 1-2 each by mouth See admin instructions. Take 2 gummies with breakfast and 1 gummy with dinner     fluticasone  (FLONASE ) 50 MCG/ACT nasal spray Place 2 sprays into both nostrils daily as needed for allergies or rhinitis. 9.9 mL 0   gabapentin  (NEURONTIN ) 300 MG capsule Take 600 mg by mouth at bedtime.   1   HYDROcodone -acetaminophen  (NORCO) 10-325 MG tablet Take 1 tablet by mouth every 6 (six) hours as needed for moderate pain (pain score 4-6).     Lancets (ONETOUCH DELICA PLUS LANCET33G) MISC       Magnesium  250 MG TABS Take 250 mg by mouth daily.     methocarbamol  (ROBAXIN ) 500 MG tablet Take 1 tablet (500 mg total) by mouth every 6 (six) hours as needed for muscle spasms. (Patient taking differently: Take 500 mg by mouth at bedtime.) 60 tablet 0   mirabegron  ER (MYRBETRIQ ) 50 MG TB24 tablet Take 1 tablet (50 mg total) by mouth daily. 90 tablet 3   ONETOUCH VERIO test strip      pantoprazole  (PROTONIX ) 40 MG tablet Take 40 mg by mouth 2 (two) times daily.     phenylephrine  (NEO-SYNEPHRINE) 0.25 % nasal spray Place 1 spray into both nostrils at bedtime as needed for congestion.     polyethylene glycol powder (GLYCOLAX /MIRALAX ) 17 GM/SCOOP powder Take 1/2 dose daily. (Patient taking differently: as needed. Take 1/2 dose daily.) 3350 g 1   rosuvastatin  (CRESTOR ) 20 MG tablet Take 1 tablet (20 mg total) by mouth daily. 30 tablet 0   tirzepatide  (MOUNJARO ) 7.5 MG/0.5ML Pen Inject 7.5 mg into the skin once a week. 2 mL 0   No current facility-administered medications on file prior to visit.    Allergies  Allergen Reactions   Ciprofloxacin Hcl Hives   Percocet [Oxycodone -Acetaminophen ] Other (See Comments)    RESPIRATORY DISTRESS   Penicillins Hives and Other (See Comments)    Has patient had a PCN reaction causing immediate rash, facial/tongue/throat swelling, SOB or lightheadedness with hypotension: no Has patient had a PCN reaction causing severe rash involving mucus membranes or skin necrosis: no Has patient had a PCN reaction that required hospitalization no Has patient had a PCN reaction occurring within the last 10 years: no If all of the above answers are NO, then may proceed with Cephalosporin use.   Sulfa Antibiotics Hives   Tape Other (See Comments)    Adhesives - Causes Blisters. No band aids, and plastic tape    Tetanus Toxoids Hives and Rash   Jardiance [Empagliflozin]     Severe UTI     DIAGNOSTIC DATA (LABS, IMAGING, TESTING) - I reviewed patient records, labs,  notes, testing and imaging myself where available.  Lab Results  Component Value Date   WBC 10.9 (H) 11/30/2023   HGB 12.4 11/30/2023   HCT 37.5 11/30/2023   MCV 88.2 11/30/2023   PLT 327 11/30/2023      Component Value Date/Time   NA 130 (L) 12/07/2023 0736   NA 132 (L) 06/08/2023 0941   K 3.4 (L) 12/07/2023 0736   CL 95 (L) 12/07/2023 9263  CO2 25 12/07/2023 0736   GLUCOSE 118 (H) 12/07/2023 0736   BUN 15 12/07/2023 0736   BUN 14 06/08/2023 0941   CREATININE 0.97 12/07/2023 0736   CREATININE 0.87 06/08/2022 1439   CALCIUM  8.7 (L) 12/07/2023 0736   PROT 6.7 06/08/2023 0941   ALBUMIN  4.3 06/08/2023 0941   AST 19 06/08/2023 0941   AST 16 06/08/2022 1439   ALT 16 06/08/2023 0941   ALT 12 06/08/2022 1439   ALKPHOS 58 06/08/2023 0941   BILITOT 0.4 06/08/2023 0941   BILITOT 0.4 06/08/2022 1439   GFRNONAA 60 (L) 12/07/2023 0736   GFRNONAA >60 06/08/2022 1439   GFRAA >60 02/04/2020 1212   GFRAA >60 11/09/2018 1405   Lab Results  Component Value Date   CHOL 123 06/08/2023   HDL 67 06/08/2023   LDLCALC 41 06/08/2023   TRIG 75 06/08/2023   Lab Results  Component Value Date   HGBA1C 6.0 (H) 06/08/2023   Lab Results  Component Value Date   VITAMINB12 444 04/11/2024   Lab Results  Component Value Date   TSH 2.450 04/11/2024    PHYSICAL EXAM:  Today's Vitals   05/24/24 0900  BP: 129/74  Pulse: 73  Weight: 183 lb 12.8 oz (83.4 kg)  Height: 5' 3 (1.6 m)   Body mass index is 32.56 kg/m.   Wt Readings from Last 3 Encounters:  05/24/24 183 lb 12.8 oz (83.4 kg)  05/01/24 180 lb (81.6 kg)  04/11/24 184 lb (83.5 kg)     Ht Readings from Last 3 Encounters:  05/24/24 5' 3 (1.6 m)  05/01/24 5' 3 (1.6 m)  04/11/24 5' 3 (1.6 m)      General: The patient is awake, alert and appears not in acute distress. The patient is well groomed. Head: Normocephalic, atraumatic.  Neck is supple. Mallampati 3,  neck circumference:15.5 inches .  Nasal airflow not fully  patent.   Retrognathia is  seen.  Overbite . No braces.  Dental status: biological  Cardiovascular:  Regular rate and cardiac rhythm by pulse,  without distended neck veins. Respiratory: Lungs are clear to auscultation.  Skin:  Without evidence of ankle edema, or rash. Trunk: The patient's posture is erect.   NEUROLOGIC EXAM: The patient is awake and alert, oriented to place and time.   Memory subjective described as impaired to word finding. .  Attention span & concentration ability appears normal.  Speech is fluent,  with mild dysarthria, mild dysphonia , mild  aphasia.  Mood and affect are appropriate.   Cranial nerves: no loss of smell or taste reported  Pupils are equal and briskly reactive to light. Funduscopic exam deferred.  Extraocular movements in vertical and horizontal planes were intact and without nystagmus. No Diplopia. Visual fields by finger perimetry are intact. Hearing was intact to soft voice and finger rubbing.    Facial sensation intact to fine touch.  Facial motor strength is symmetric and tongue and uvula move midline.  Neck ROM : rotation, tilt and flexion extension were normal for age and shoulder shrug was symmetrical.    Motor exam:  Symmetric bulk, tone and ROM.   Normal tone without cog wheeling, symmetric grip strength .   Sensory:  Fine touch, pinprick and vibration were tested  and  normal.  Proprioception tested in the upper extremities was normal.   Coordination: Rapid alternating movements in the fingers/hands were of normal speed.  The Finger-to-nose maneuver was intact without evidence of ataxia, dysmetria or tremor.  Gait and station: Patient could rise unassisted from a seated position, walked without assistive device.  Stance is of normal width/ base. Deep tendon reflexes: TK replacement - bilaterally.     ASSESSMENT AND PLAN 35 -year- old female  here with:    1) Mrs. Hara describes restless legs setting on and only later in  life around the time of her 70s per staff.  She was diagnosed with diabetes earlier, and she now has nerve pain which may be diabetic as well.  She also has chronic back problems lower back problems and these can cause restless legs 2.   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    Plan : I think I would like to have an inflammatory marker profile . She had low ferritin levels and was placed on oral iron. ( 04-11-2024) I would also like to add ferritin and total iron binding capacity to this profile.   I would offer some medication gabapentin  is usually an effective way but the patient is already on it for other reasons so we may offer Lyrica instead as this slightly more effective way of treating restless legs.   I do like magnesium  at night but it helps mostly spasticity and cramping and not so much restless legs.   I noted that the patient is taking a narcotic pain medication for her lower back pain this usually is a very effective way of treating restless legs but comes with a lot of negative package.  I would love to order a sleep study in the sleep lab for her if she is willing and we can also see if her restless legs transfer into the night and if she has any restless leg activity or periodic limb movements in dream sleep.   PSG , baseline, special attention to PLMs. Lyrica 50 mg bid discussed but we may have to first maximize gabapentin .  Neuropathy panel was partially done,  I added a SPEP.    I plan to follow up either personally or through our NP within 6 months.   I would like to thank Okey Carlin Redbird, MD and Ines Onetha NOVAK, Md [address] [place],  [place] [address] for allowing me to meet with and to take care of this pleasant patient.     After spending a total time of  45  minutes face to face and additional time for physical and neurologic examination, review of laboratory studies,  personal review of  imaging studies, reports and results of other testing and review of referral information / records as far as provided in visit,   Electronically signed by: Dedra Gores, MD 05/24/2024 9:30 AM  [facility] and [facility] Sleep Board certified by The [employer] of Sleep Medicine and Diplomate of the [employer] of Sleep Medicine. Board certified In Neurology through the ABPN, Fellow of the [employer] of Neu[employer].

## 2024-05-25 ENCOUNTER — Telehealth: Payer: Self-pay | Admitting: Neurology

## 2024-05-25 NOTE — Telephone Encounter (Signed)
 NPSG HTA pending

## 2024-05-26 LAB — COMPREHENSIVE METABOLIC PANEL WITH GFR
ALT: 12 IU/L (ref 0–32)
AST: 17 IU/L (ref 0–40)
Albumin: 4.3 g/dL (ref 3.8–4.8)
Alkaline Phosphatase: 72 IU/L (ref 44–121)
BUN/Creatinine Ratio: 9 — ABNORMAL LOW (ref 12–28)
BUN: 7 mg/dL — ABNORMAL LOW (ref 8–27)
Bilirubin Total: 0.4 mg/dL (ref 0.0–1.2)
CO2: 21 mmol/L (ref 20–29)
Calcium: 9.3 mg/dL (ref 8.7–10.3)
Chloride: 101 mmol/L (ref 96–106)
Creatinine, Ser: 0.81 mg/dL (ref 0.57–1.00)
Globulin, Total: 2.3 g/dL (ref 1.5–4.5)
Glucose: 68 mg/dL — ABNORMAL LOW (ref 70–99)
Potassium: 3.5 mmol/L (ref 3.5–5.2)
Sodium: 140 mmol/L (ref 134–144)
Total Protein: 6.6 g/dL (ref 6.0–8.5)
eGFR: 74 mL/min/1.73 (ref >59)

## 2024-05-26 LAB — PROTEIN ELECTROPHORESIS, SERUM
A/G Ratio: 1.4 (ref 0.7–1.7)
Albumin ELP: 3.9 g/dL (ref 2.9–4.4)
Alpha 1: 0.3 g/dL (ref 0.0–0.4)
Alpha 2: 0.8 g/dL (ref 0.4–1.0)
Beta: 0.9 g/dL (ref 0.7–1.3)
Gamma Globulin: 0.7 g/dL (ref 0.4–1.8)
Globulin, Total: 2.7 g/dL (ref 2.2–3.9)

## 2024-05-28 ENCOUNTER — Ambulatory Visit: Payer: Self-pay | Admitting: Neurology

## 2024-05-28 NOTE — Telephone Encounter (Signed)
 This RN spoke with pt per her call stating  I found a new lump under my arm  Lauren [name] states she noticed approximately 5 days ago an area under her right arm - more on the fatty part of your arm and not directly under the arm.  She states her daughter felt it with description of cord like.  Lauren [name] states with first finding - area not tender but now with touching it - I think I made it sore.  Note pt surgery was on right side.  Right arm is not affected.  Per discussion- pt will come in this week for evaluation- discussed above is descriptive of cording which can be common post surgery but need for full evaluation.  Pt very appreciative of discussion and plan.

## 2024-05-29 ENCOUNTER — Ambulatory Visit

## 2024-05-30 ENCOUNTER — Telehealth: Payer: Self-pay

## 2024-05-30 NOTE — Telephone Encounter (Signed)
 Pt verbally confirmed appt for 7/31

## 2024-05-31 ENCOUNTER — Inpatient Hospital Stay: Attending: Hematology and Oncology | Admitting: Hematology and Oncology

## 2024-05-31 ENCOUNTER — Encounter: Payer: Self-pay | Admitting: Hematology and Oncology

## 2024-05-31 VITALS — BP 139/68 | HR 72 | Temp 98.3°F | Resp 12 | Wt 182.8 lb

## 2024-05-31 DIAGNOSIS — Z8051 Family history of malignant neoplasm of kidney: Secondary | ICD-10-CM | POA: Insufficient documentation

## 2024-05-31 DIAGNOSIS — C50411 Malignant neoplasm of upper-outer quadrant of right female breast: Secondary | ICD-10-CM

## 2024-05-31 DIAGNOSIS — Z8042 Family history of malignant neoplasm of prostate: Secondary | ICD-10-CM | POA: Insufficient documentation

## 2024-05-31 DIAGNOSIS — Z170 Estrogen receptor positive status [ER+]: Secondary | ICD-10-CM | POA: Diagnosis not present

## 2024-05-31 DIAGNOSIS — Z800 Family history of malignant neoplasm of digestive organs: Secondary | ICD-10-CM | POA: Insufficient documentation

## 2024-05-31 DIAGNOSIS — Z853 Personal history of malignant neoplasm of breast: Secondary | ICD-10-CM | POA: Insufficient documentation

## 2024-05-31 DIAGNOSIS — Z87891 Personal history of nicotine dependence: Secondary | ICD-10-CM | POA: Diagnosis not present

## 2024-05-31 DIAGNOSIS — R222 Localized swelling, mass and lump, trunk: Secondary | ICD-10-CM | POA: Insufficient documentation

## 2024-05-31 DIAGNOSIS — Z923 Personal history of irradiation: Secondary | ICD-10-CM | POA: Insufficient documentation

## 2024-05-31 DIAGNOSIS — Z803 Family history of malignant neoplasm of breast: Secondary | ICD-10-CM | POA: Insufficient documentation

## 2024-05-31 NOTE — Progress Notes (Signed)
 [facility]  [phone] [phone]) [phone]     ID: Lauren [name] DOB: 25-Dec-1945  MR#: [id number]  RDW#:[id number]  Patient Care Team: Okey Carlin Redbird, MD as PCP - General (Family Medicine) Raford Riggs, MD as PCP - Cardiology (Cardiology) Vernetta Lonni GRADE, MD as Consulting Physician (Orthopedic Surgery) Dewey Rush, MD as Consulting Physician (Radiation Oncology) Neysa Inocente PARAS, NP (Inactive) as Nurse Practitioner (Obstetrics and Gynecology) Rosalie Kitchens, MD as Consulting Physician (Gastroenterology) Vernetta Berg, MD as Consulting Physician (General Surgery) Verdon Louann BIRCH, MD as Consulting Physician (Family Medicine) OTHER MD:  CHIEF COMPLAINT: Estrogen receptor positive breast cancer  CURRENT TREATMENT: Observation  INTERVAL HISTORY:  Lauren [name] returns today for follow-up of her estrogen receptor positive breast cancer.  She is now under observation alone. She had left hip replacement in feb 2025, and continues to work on balance. She may be overdue for mammogram. She felt something in her right axilla for about a week now and she called breast center who recommended she call us . She says she felt a hard lump in her right axilla while bathing. No breast mass. Rest of the pertinent 10 point ROS reviewed and neg.  COVID 19 VACCINATION STATUS: Pfizer x2   HISTORY OF CURRENT ILLNESS: From the original intake note:  Lauren [name] had routine screening mammography on 10/04/2018 showing a possible abnormality in the right breast. She underwent unilateral right diagnostic mammography with tomography and right breast ultrasonography at [facility] on 10/12/2018 showing: Breast Density Category C. Spot compression tomosynthesis images through the upper outer quadrant of the right breast demonstrates a persistent irregular mass with indistinct margins measuring approximately 1 cm. On physical exam, no definite palpable mass is identified  in the upper-outer right breast. Targeted ultrasound is performed, showing an irregular hypoechoic shadowing mass at 10:30 oclock, 6 cm from the nipple measuring 9 x 8 x 9 mm. Ultrasound of the right axilla demonstrates multiple normal-appearing lymph nodes.  Accordingly on 10/18/2018 she proceeded to biopsy of the right breast area in question. The pathology from this procedure showed ([id number]): invasive ductal carcinoma, grade II. Prognostic indicators significant for: estrogen receptor, 100% positive and progesterone receptor, 20% positive, both with strong staining intensity. Proliferation marker Ki67 at 5%. HER2 equivocal (2+) by immunohistochemisty but negative by fluorescent in situ hybridization with a signals ratio 1.26 and number per cell 2.40.   The patient's subsequent history is as detailed below.   PAST MEDICAL HISTORY: Past Medical History:  Diagnosis Date   Anxiety    Arthritis    Back, Hip- right   Atypical chest pain 06/25/2021   Back pain    Cancer (HCC)    right breast   Cataract    Colitis    Coronary artery calcification 02/20/2020   Diabetes mellitus without complication (HCC)    Type II   Edema 03/05/2021   Edema of both lower extremities    Endometriosis    Family history of adverse reaction to anesthesia    Father - N/V - blood pressure; daughter N/V   Family history of breast cancer    Family history of colon cancer    Family history of kidney cancer    Family history of prostate cancer    Fatty liver    GERD (gastroesophageal reflux disease)    History of hiatal hernia    History of kidney stones    History of ulcerative colitis    Hypertension    Insomnia    Joint pain  Kidney problem    Lower extremity edema 03/05/2021   Lumbar disc disease    L4 L5   Neuropathy    Osteoarthritis    Other hyperlipidemia    Palpitations    Personal history of radiation therapy    Pneumonia    1983, 2003   Pure hypercholesterolemia 02/20/2020   PVCs  (premature ventricular contractions)    benign   Umbilical hernia    Vitamin D  deficiency    Wears glasses     PAST SURGICAL HISTORY: Past Surgical History:  Procedure Laterality Date   APPENDECTOMY     BREAST BIOPSY  08-13-2009  DR TSUEI   EXCISION LEFT NIPPLE DUCT   BREAST EXCISIONAL BIOPSY Left    x2   BREAST EXCISIONAL BIOPSY Right    BREAST LUMPECTOMY Right 11/16/2018   invasive ductal   CHOLECYSTECTOMY  1992   COLON RESECTION  1986   ULCERATIVE COLITIS   COLON SURGERY     ESOPHAGEAL MANOMETRY N/A 06/11/2013   Procedure: ESOPHAGEAL MANOMETRY (EM);  Surgeon: Oliva FORBES Boots, MD;  Location: [facility];  Service: Endoscopy;  Laterality: N/A;   ESOPHAGOGASTRODUODENOSCOPY (EGD) WITH PROPOFOL  N/A 05/31/2016   Procedure: ESOPHAGOGASTRODUODENOSCOPY (EGD) WITH PROPOFOL ;  Surgeon: Oliva Boots, MD;  Location: [facility] ENDOSCOPY;  Service: Endoscopy;  Laterality: N/A;   EXCISION RIGHT BREAST MASS   10-20-2005  DR MAUDE YOUNG   EYE SURGERY Bilateral 2023   cataract   FRACTURE SURGERY     left arm   kidney stone removed  12/2009   KNEE ARTHROSCOPY Left    MCL   LEFT THUMB CARPOMETACARPAL JOINT SUSPENSIONPLASTY  04-06-2006  DR SISSY   LEFT WRIST ARTHROSCOPY W/ DEBRIDEMENT AND REMOVAL CYST  03-26-2004  DR SISSY   MYOMECTOMY     RADIOACTIVE SEED GUIDED PARTIAL MASTECTOMY WITH AXILLARY SENTINEL LYMPH NODE BIOPSY Right 11/16/2018   Procedure: RIGHT BREAST RADIOACTIVE SEED GUIDED PARTIAL MASTECTOMY WITH  SENTINEL  NODE BIOPSY;  Surgeon: Vernetta Berg, MD;  Location: [facility] OR;  Service: General;  Laterality: Right;   RIGHT COLECTOMY     RIGHT URETEROSCOPIC STONE EXTRACTION  12-11-2009  DR [facility]LEEN [facility]   TENDON RELEASE     TONSILLECTOMY     TOTAL HIP ARTHROPLASTY Right 11/26/2016   Procedure: RIGHT TOTAL HIP ARTHROPLASTY ANTERIOR APPROACH;  Surgeon: Lonni CINDERELLA Vernetta, MD;  Location: [facility] ORS;  Service: Orthopedics;  Laterality: Right;   TOTAL HIP ARTHROPLASTY Left 12/06/2023   Procedure:  LEFT TOTAL HIP ARTHROPLASTY ANTERIOR APPROACH;  Surgeon: Vernetta Lonni CINDERELLA, MD;  Location: [facility] OR;  Service: Orthopedics;  Laterality: Left;   TOTAL KNEE ARTHROPLASTY Left 12/02/2017   Procedure: LEFT TOTAL KNEE ARTHROPLASTY;  Surgeon: Vernetta Lonni CINDERELLA, MD;  Location: [facility];  Service: Orthopedics;  Laterality: Left;   TOTAL KNEE ARTHROPLASTY Right 08/03/2022   Procedure: RIGHT TOTAL KNEE ARTHROPLASTY;  Surgeon: Vernetta Lonni CINDERELLA, MD;  Location: [facility] OR;  Service: Orthopedics;  Laterality: Right;   WRIST SURGERY     both    FAMILY HISTORY: Family History  Problem Relation Age of Onset   Hyperlipidemia Mother    Diabetes Mother    Hypertension Mother    Parkinson's disease Mother    Kidney disease Mother    Kidney disease Father    Stroke Father    Heart failure Father    Hypertension Father    Heart disease Father    Diabetes Father    Cancer Father        colon, prostate, and kidney  Restless legs syndrome Father    Cancer Maternal Aunt        cervical vs ovarian   Colon cancer Paternal Aunt        dx less than 50   Breast cancer Paternal Aunt        dx in her 83s   Prostate cancer Paternal Uncle    Heart disease Maternal Grandmother    Diabetes Maternal Grandmother    Heart disease Maternal Grandfather    Diabetes Maternal Grandfather    Cancer Maternal Grandfather        pt unaware of what kind   Stroke Paternal Grandmother    Stroke Paternal Grandfather    Breast cancer Cousin        pat first cousin, dx over 72   Prostate cancer Other        PGF's father  Cindy's father died from colon cancer at age 77; he was diagnosed with prostate cancer at 3, and he also had kidney cancer. Patients' mother died from failure to thrive at age 75. The patient has no siblings. Lauren [name] had a paternal aunt that was diagnosed with breast cancer in her mid 66's. Lauren [name] also has a paternal great grandfather that was diagnosed with prostate cancer. Patient denies anyone in  her family having ovarian cancer.    GYNECOLOGIC HISTORY:  No LMP recorded. Patient is postmenopausal. 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 Her husband, Larnell, ran a Herbalist. Together, they have two children, Richard and [employer]. Their son, Charlie, lives in [place] and is disabled (MS). Their daughter, Augustin, is a Scientist, forensic (and also has MS). Lauren [name] has 5 grandchildren, no great grandchildren. She attends a [employer].    ADVANCED DIRECTIVES: In the absence of any documentation to the contrary, the patient's spouse is their HCPOA.    HEALTH MAINTENANCE: Social History   Tobacco Use   Smoking status: Former    Current packs/day: 0.00    Average packs/day: 1 pack/day for 10.0 years (10.0 ttl pk-yrs)    Types: Cigarettes    Start date: 09/22/1972    Quit date: 09/22/1982    Years since quitting: 41.7   Smokeless tobacco: Former    Quit date: 1983   Tobacco comments:    quit 1983  Vaping Use   Vaping status: Never Used  Substance Use Topics   Alcohol use: No   Drug use: No    Colonoscopy: yes, 3 or 4 years ago  PAP: Stopped at 37; Inocente Salt, NP - [facility]  Bone density: 12/2019, -1.7  Allergies  Allergen Reactions   Ciprofloxacin Hcl Hives   Percocet [Oxycodone -Acetaminophen ] Other (See Comments)    RESPIRATORY DISTRESS   Penicillins Hives and Other (See Comments)    Has patient had a PCN reaction causing immediate rash, facial/tongue/throat swelling, SOB or lightheadedness with hypotension: no Has patient had a PCN reaction causing severe rash involving mucus membranes or skin necrosis: no Has patient had a PCN reaction that required hospitalization no Has patient had a PCN reaction occurring  within the last 10 years: no If all of the above answers are NO, then may proceed with Cephalosporin use.   Sulfa Antibiotics Hives   Tape Other (See Comments)  Adhesives - Causes Blisters. No band aids, and plastic tape    Tetanus Toxoids Hives and Rash   Jardiance [Empagliflozin]     Severe UTI    Current Outpatient Medications  Medication Sig Dispense Refill   aspirin  EC 81 MG tablet Take 81 mg by mouth daily. Swallow whole.     B Complex-C (B-COMPLEX WITH VITAMIN C) tablet Take 1 tablet by mouth daily.     escitalopram  (LEXAPRO ) 20 MG tablet Take 1 tablet (20 mg total) by mouth daily. 90 tablet 0   FIBER GUMMIES PO Take 1-2 each by mouth See admin instructions. Take 2 gummies with breakfast and 1 gummy with dinner     fluticasone  (FLONASE ) 50 MCG/ACT nasal spray Place 2 sprays into both nostrils daily as needed for allergies or rhinitis. 9.9 mL 0   gabapentin  (NEURONTIN ) 300 MG capsule Take 600 mg by mouth at bedtime.   1   HYDROcodone -acetaminophen  (NORCO) 10-325 MG tablet Take 1 tablet by mouth every 6 (six) hours as needed for moderate pain (pain score 4-6).     Lancets (ONETOUCH DELICA PLUS LANCET33G) MISC      Magnesium  250 MG TABS Take 250 mg by mouth daily.     methocarbamol  (ROBAXIN ) 500 MG tablet Take 1 tablet (500 mg total) by mouth every 6 (six) hours as needed for muscle spasms. (Patient taking differently: Take 500 mg by mouth at bedtime.) 60 tablet 0   mirabegron  ER (MYRBETRIQ ) 50 MG TB24 tablet Take 1 tablet (50 mg total) by mouth daily. 90 tablet 3   ONETOUCH VERIO test strip      pantoprazole  (PROTONIX ) 40 MG tablet Take 40 mg by mouth 2 (two) times daily.     phenylephrine  (NEO-SYNEPHRINE) 0.25 % nasal spray Place 1 spray into both nostrils at bedtime as needed for congestion.     polyethylene glycol powder (GLYCOLAX /MIRALAX ) 17 GM/SCOOP powder Take 1/2 dose daily. 3350 g 1   rosuvastatin  (CRESTOR ) 20 MG tablet Take 1 tablet (20 mg total) by mouth daily. 30 tablet  0   tirzepatide  (MOUNJARO ) 7.5 MG/0.5ML Pen Inject 7.5 mg into the skin once a week. 2 mL 0   No current facility-administered medications for this visit.    OBJECTIVE:  white woman who appears stated age  Vitals:   05/31/24 1339  BP: 139/68  Pulse: 72  Resp: 12  Temp: 98.3 F (36.8 C)  SpO2: 99%       Body mass index is 32.38 kg/m.   Wt Readings from Last 3 Encounters:  05/31/24 182 lb 12.8 oz (82.9 kg)  05/24/24 183 lb 12.8 oz (83.4 kg)  05/01/24 180 lb (81.6 kg)      ECOG FS:2 - Symptomatic, <50% confined to bed  Physical Exam Constitutional:      Appearance: Normal appearance.  Chest:       Comments: Palpable firmness in the right axilla. ? Lymphadenopathy. At the surgical scar which is in the upper outer quadrant there is some scar tissue. No masses in the breast otherwise. Left breast normal to inspection and palpation. Musculoskeletal:     Cervical back: Normal range of motion. No rigidity.  Lymphadenopathy:     Cervical: No cervical adenopathy.  Neurological:     Mental Status: She is alert.       LAB RESULTS:  CMP     Component Value Date/Time   NA 140 05/24/2024 1032   K 3.5 05/24/2024 1032   CL 101 05/24/2024 1032   CO2  21 05/24/2024 1032   GLUCOSE 68 (L) 05/24/2024 1032   GLUCOSE 118 (H) 12/07/2023 0736   BUN 7 (L) 05/24/2024 1032   CREATININE 0.81 05/24/2024 1032   CREATININE 0.87 06/08/2022 1439   CALCIUM  9.3 05/24/2024 1032   PROT 6.6 05/24/2024 1032   ALBUMIN  4.3 05/24/2024 1032   AST 17 05/24/2024 1032   AST 16 06/08/2022 1439   ALT 12 05/24/2024 1032   ALT 12 06/08/2022 1439   ALKPHOS 72 05/24/2024 1032   BILITOT 0.4 05/24/2024 1032   BILITOT 0.4 06/08/2022 1439   GFRNONAA 60 (L) 12/07/2023 0736   GFRNONAA >60 06/08/2022 1439   GFRAA >60 02/04/2020 1212   GFRAA >60 11/09/2018 1405    Lab Results  Component Value Date   ALBUMINELP 3.9 05/24/2024   MSPIKE Not Observed 05/24/2024    No results found for: KPAFRELGTCHN,  LAMBDASER, KAPLAMBRATIO  Lab Results  Component Value Date   WBC 10.9 (H) 11/30/2023   NEUTROABS 4.0 06/08/2022   HGB 12.4 11/30/2023   HCT 37.5 11/30/2023   MCV 88.2 11/30/2023   PLT 327 11/30/2023   No results found for: LABCA2  No components found for: OJARJW874  No results for input(s): INR in the last 168 hours.  No results found for: LABCA2  No results found for: RJW800  No results found for: CAN125  No results found for: CAN153  No results found for: CA2729  No components found for: HGQUANT  No results found for: CEA1, CEA / No results found for: CEA1, CEA   No results found for: AFPTUMOR  No results found for: CHROMOGRNA  No results found for: HGBA, HGBA2QUANT, HGBFQUANT, HGBSQUAN (Hemoglobinopathy evaluation)   No results found for: LDH  Lab Results  Component Value Date   IRON 70 04/11/2024   TIBC 340 04/11/2024   IRONPCTSAT 21 04/11/2024   (Iron and TIBC)  Lab Results  Component Value Date   FERRITIN 26 04/11/2024    Urinalysis    Component Value Date/Time   COLORURINE YELLOW 04/04/2024 1128   APPEARANCEUR CLEAR 04/04/2024 1128   LABSPEC 1.031 (H) 04/04/2024 1128   PHURINE 6.0 04/04/2024 1128   GLUCOSEU NEGATIVE 04/04/2024 1128   HGBUR NEGATIVE 04/04/2024 1128   BILIRUBINUR NEGATIVE 04/04/2024 1128   BILIRUBINUR Negative 02/22/2024 1051   KETONESUR TRACE (A) 04/04/2024 1128   PROTEINUR 30 (A) 04/04/2024 1128   UROBILINOGEN 0.2 02/22/2024 1051   UROBILINOGEN 0.2 11/28/2013 1456   NITRITE NEGATIVE 04/04/2024 1128   LEUKOCYTESUR TRACE (A) 04/04/2024 1128    STUDIES:  No results found.   ELIGIBLE FOR AVAILABLE RESEARCH PROTOCOL: no   ASSESSMENT: 78 y.o. [place], [place] woman status post right breast upper outer quadrant biopsy 10/18/2018 for a clinical T1b N0, stage IA invasive ductal carcinoma, grade 2, estrogen and progesterone receptor positive, MIB-1 5%, with no HER-2 amplification by  FISH  (1) right lumpectomy and sentinel lymph node sampling 11/16/2018 showed a pT1c pN0, stage IA invasive ductal carcinoma, grade 2, with negative margins  (a) a single sentinel lymph node was removed  (2) Oncotype score of 12 predicted a risk of recurrence outside the breast over the next 9 years of only 3% if the patient's only systemic treatment was antiestrogens for 5 years.  No significant benefit was anticipated from chemotherapy.  (a) Oncotype also read tumor as HER-2 negative  (3) adjuvant radiation 01/02/2019 to 01/29/2019. Site/dose:   The patient initially received a dose of 42.56 Gy in 16 fractions to the right breast  using whole-breast tangent fields. This was delivered using a 3-D conformal technique. The patient then received a boost. This delivered an additional 8 Gy in 4 fractions using a 3 field photon technique. The total dose was 50.56 Gy.  (4) anastrozole  started 02/13/2019  (a) bone density 01/06/2012 was normal  (b) anastrozole  discontinued April 2021, with arthralgias/myalgias  (5) genetics testing 11/27/2018 through the Multi-Gene Panel offered by [facility] found no deleterious mutations in AIP, ALK, APC, ATM, AXIN2,BAP1,  BARD1, BLM, BMPR1A, BRCA1, BRCA2, BRIP1, CASR, CDC73, CDH1, CDK4, CDKN1B, CDKN1C, CDKN2A (p14ARF), CDKN2A (p16INK4a), CEBPA, CHEK2, CTNNA1, DICER1, DIS3L2, EGFR (c.2369C>T, p.Thr790Met variant only), EPCAM (Deletion/duplication testing only), FH, FLCN, GATA2, GPC3, GREM1 (Promoter region deletion/duplication testing only), HOXB13 (c.251G>A, p.Gly84Glu), HRAS, KIT, MAX, MEN1, MET, MITF (c.952G>A, p.Glu318Lys variant only), MLH1, MSH2, MSH3, MSH6, MUTYH, NBN, NF1, NF2, NTHL1, PALB2, PDGFRA, PHOX2B, PMS2, POLD1, POLE, POT1, PRKAR1A, PTCH1, PTEN, RAD50, RAD51C, RAD51D, RB1, RECQL4, RET, RUNX1, SDHAF2, SDHA (sequence changes only), SDHB, SDHC, SDHD, SMAD4, SMARCA4, SMARCB1, SMARCE1, STK11, SUFU, TERC, TERT, TMEM127, TP53, TSC1, TSC2, VHL, WRN and WT1.    (a) a  variant of uncertain significant was found in DICER1, c.2118C>T (silent)  (6) exemestane  prescribed 04/03/2020--could not obtain secondary to cost  (a) started letrozole  Jun 2021, discontinued because of intolerable side effects  (b) opted against tamoxifen (see discussion 08/05/2020 visit)   PLAN:  Lauren [name] is currently on observation alone.  About a week ago, she felt a lump in the right axilla while bathing and she wanted to follow up on it. On exam, I do feel palpable firmness in the right axilla concerning for lymphadenopathy. No def palpable mass in the right breast. Left breast exam unremarkable. Ordered diagnostic mammogram bilateral and US  right breast and axilla. She is agreeable to this,  FU telephone visit in 2 weeks to review results.  Time spent: 30 min  *Total Encounter Time as defined by the Centers for Medicare and Medicaid Services includes, in addition to the face-to-face time of a patient visit (documented in the note above) non-face-to-face time: obtaining and reviewing outside history, ordering and reviewing medications, tests or procedures, care coordination (communications with other health care professionals or caregivers) and documentation in the medical record.

## 2024-06-04 ENCOUNTER — Ambulatory Visit: Payer: Self-pay | Admitting: Hematology and Oncology

## 2024-06-04 ENCOUNTER — Other Ambulatory Visit: Payer: Self-pay | Admitting: Hematology and Oncology

## 2024-06-04 ENCOUNTER — Ambulatory Visit
Admission: RE | Admit: 2024-06-04 | Discharge: 2024-06-04 | Disposition: A | Discharge status: Home / Self Care | Source: Ambulatory Visit | Attending: Hematology and Oncology | Admitting: Hematology and Oncology

## 2024-06-04 ENCOUNTER — Ambulatory Visit
Admission: RE | Admit: 2024-06-04 | Discharge: 2024-06-04 | Disposition: A | Discharge status: Home / Self Care | Source: Ambulatory Visit | Attending: Hematology and Oncology

## 2024-06-04 ENCOUNTER — Ambulatory Visit

## 2024-06-04 DIAGNOSIS — R928 Other abnormal and inconclusive findings on diagnostic imaging of breast: Secondary | ICD-10-CM | POA: Diagnosis not present

## 2024-06-04 DIAGNOSIS — Z170 Estrogen receptor positive status [ER+]: Secondary | ICD-10-CM

## 2024-06-04 DIAGNOSIS — R590 Localized enlarged lymph nodes: Secondary | ICD-10-CM | POA: Diagnosis not present

## 2024-06-04 NOTE — Telephone Encounter (Signed)
 Checked with HTA it is still pending.

## 2024-06-06 ENCOUNTER — Ambulatory Visit (INDEPENDENT_AMBULATORY_CARE_PROVIDER_SITE_OTHER): Admitting: Family Medicine

## 2024-06-06 ENCOUNTER — Encounter (INDEPENDENT_AMBULATORY_CARE_PROVIDER_SITE_OTHER): Payer: Self-pay | Admitting: Family Medicine

## 2024-06-06 ENCOUNTER — Telehealth (INDEPENDENT_AMBULATORY_CARE_PROVIDER_SITE_OTHER): Payer: Self-pay

## 2024-06-06 ENCOUNTER — Ambulatory Visit: Attending: Orthopedic Surgery

## 2024-06-06 VITALS — BP 122/7 | HR 80 | Temp 98.5°F | Ht 63.0 in | Wt 178.0 lb

## 2024-06-06 DIAGNOSIS — M25552 Pain in left hip: Secondary | ICD-10-CM | POA: Insufficient documentation

## 2024-06-06 DIAGNOSIS — M5459 Other low back pain: Secondary | ICD-10-CM | POA: Insufficient documentation

## 2024-06-06 DIAGNOSIS — M25551 Pain in right hip: Secondary | ICD-10-CM | POA: Diagnosis not present

## 2024-06-06 DIAGNOSIS — Z7985 Long-term (current) use of injectable non-insulin antidiabetic drugs: Secondary | ICD-10-CM

## 2024-06-06 DIAGNOSIS — M62838 Other muscle spasm: Secondary | ICD-10-CM | POA: Diagnosis not present

## 2024-06-06 DIAGNOSIS — R293 Abnormal posture: Secondary | ICD-10-CM | POA: Diagnosis not present

## 2024-06-06 DIAGNOSIS — E1140 Type 2 diabetes mellitus with diabetic neuropathy, unspecified: Secondary | ICD-10-CM

## 2024-06-06 DIAGNOSIS — E119 Type 2 diabetes mellitus without complications: Secondary | ICD-10-CM | POA: Diagnosis not present

## 2024-06-06 DIAGNOSIS — Z6831 Body mass index (BMI) 31.0-31.9, adult: Secondary | ICD-10-CM

## 2024-06-06 DIAGNOSIS — R279 Unspecified lack of coordination: Secondary | ICD-10-CM | POA: Diagnosis not present

## 2024-06-06 DIAGNOSIS — M6281 Muscle weakness (generalized): Secondary | ICD-10-CM | POA: Diagnosis not present

## 2024-06-06 DIAGNOSIS — E669 Obesity, unspecified: Secondary | ICD-10-CM | POA: Diagnosis not present

## 2024-06-06 MED ORDER — TIRZEPATIDE 7.5 MG/0.5ML ~~LOC~~ SOAJ: 7.5000 mg | SUBCUTANEOUS | 0 refills | Status: DC

## 2024-06-06 NOTE — Telephone Encounter (Signed)
 Lauren [name] (Key: [id number]) Rx #: [phone] Mounjaro  7.5MG /0.5ML auto-injectors PA was started

## 2024-06-06 NOTE — Telephone Encounter (Signed)
 Message from Plan 06-AUG-25:06-AUG-26  Mounjaro  7.5MG /0.5ML  [place] SOAJ Quantity:2; Pt was approved

## 2024-06-06 NOTE — Progress Notes (Signed)
 Office: [phone]  /  Fax: [phone]  WEIGHT SUMMARY AND BIOMETRICS  Anthropometric Measurements Height: 5' 3 (1.6 m) Weight: 178 lb (80.7 kg) BMI (Calculated): 31.54 Weight at Last Visit: 180lb Weight Lost Since Last Visit: 2lb Weight Gained Since Last Visit: 0lb Starting Weight: 219lb Total Weight Loss (lbs): 41 lb (18.6 kg) Peak Weight: 222lb   Body Composition  Body Fat %: 46.3 % Fat Mass (lbs): 82.6 lbs Muscle Mass (lbs): 91.2 lbs Total Body Water  (lbs): 70.8 lbs Visceral Fat Rating : 14   Other Clinical Data Fasting: No Labs: No Today's Visit #: 38 Starting Date: 04/22/21    Chief Complaint: OBESITY   History of Present Illness Lauren [name] is a 78 year old female with obesity and type two diabetes who presents for obesity treatment plan discussion and progress assessment.  She is adhering to the category two eating plan about forty percent of the time and engages in physical activity three days a week for sixty minutes. She has achieved a weight loss of two pounds over the past month. Her hunger peaks in the afternoon, leading her to snack rather than consume a full meal. She is experimenting with different snacks, such as cheese and crackers, and is contemplating preparing her own snack boxes.  In managing her type two diabetes, she is on Mounjaro  7.5 mg weekly and is focusing on diet, exercise, and weight loss. Certain foods cause her discomfort, and she recently experienced this after consuming barbecue. She is attentive to her diet and is attempting to incorporate more protein into her snacks.  She experienced a fall in her backyard, resulting in soreness on the left side of her breast and a twisted ankle, which was bothersome for about 24 hours. She discovered a lump in the sore area and sought medical evaluation, where a lump was confirmed. A mammogram and ultrasound were performed, but the lump was not visible on these tests. She has a  history of breast cancer on the same side, which contributes to her concern about the current lump. It is a firm and mobile small mass, best palpated by pulling the skin posterior to her axilla forward. Non tender, no erythema or induration, about 3cm in diameter      PHYSICAL EXAM:  Blood pressure (!) 122/7, pulse 80, temperature 98.5 F (36.9 C), height 5' 3 (1.6 m), weight 178 lb (80.7 kg), SpO2 96%. Body mass index is 31.53 kg/m.  DIAGNOSTIC DATA REVIEWED:  BMET    Component Value Date/Time   NA 140 05/24/2024 1032   K 3.5 05/24/2024 1032   CL 101 05/24/2024 1032   CO2 21 05/24/2024 1032   GLUCOSE 68 (L) 05/24/2024 1032   GLUCOSE 118 (H) 12/07/2023 0736   BUN 7 (L) 05/24/2024 1032   CREATININE 0.81 05/24/2024 1032   CREATININE 0.87 06/08/2022 1439   CALCIUM  9.3 05/24/2024 1032   GFRNONAA 60 (L) 12/07/2023 0736   GFRNONAA >60 06/08/2022 1439   GFRAA >60 02/04/2020 1212   GFRAA >60 11/09/2018 1405   Lab Results  Component Value Date   HGBA1C 6.0 (H) 06/08/2023   HGBA1C 6.7 (H) 11/16/2016   Lab Results  Component Value Date   INSULIN  25.6 (H) 06/08/2023   INSULIN  22.3 04/22/2021   Lab Results  Component Value Date   TSH 2.450 04/11/2024   CBC    Component Value Date/Time   WBC 10.9 (H) 11/30/2023 1347   RBC 4.25 11/30/2023 1347   HGB 12.4 11/30/2023  1347   HGB 12.3 06/08/2022 1439   HGB 11.8 05/11/2022 0954   HCT 37.5 11/30/2023 1347   HCT 35.1 05/11/2022 0954   PLT 327 11/30/2023 1347   PLT 272 06/08/2022 1439   PLT 265 05/11/2022 0954   MCV 88.2 11/30/2023 1347   MCV 87 05/11/2022 0954   MCH 29.2 11/30/2023 1347   MCHC 33.1 11/30/2023 1347   RDW 13.3 11/30/2023 1347   RDW 13.2 05/11/2022 0954   Iron Studies    Component Value Date/Time   IRON 70 04/11/2024 1208   TIBC 340 04/11/2024 1208   FERRITIN 26 04/11/2024 1208   IRONPCTSAT 21 04/11/2024 1208   Lipid Panel     Component Value Date/Time   CHOL 123 06/08/2023 0941   TRIG 75  06/08/2023 0941   HDL 67 06/08/2023 0941   LDLCALC 41 06/08/2023 0941   Hepatic Function Panel     Component Value Date/Time   PROT 6.6 05/24/2024 1032   ALBUMIN  4.3 05/24/2024 1032   AST 17 05/24/2024 1032   AST 16 06/08/2022 1439   ALT 12 05/24/2024 1032   ALT 12 06/08/2022 1439   ALKPHOS 72 05/24/2024 1032   BILITOT 0.4 05/24/2024 1032   BILITOT 0.4 06/08/2022 1439   BILIDIR <0.1 (L) 07/30/2015 1338   IBILI NOT CALCULATED 07/30/2015 1338      Component Value Date/Time   TSH 2.450 04/11/2024 1208   TSH 2.150 05/11/2022 0954   Nutritional Lab Results  Component Value Date   VD25OH 57.5 02/07/2024   VD25OH 124.0 (H) 09/22/2023   VD25OH 100.0 06/08/2023     Assessment and Plan Assessment & Plan Obesity Obesity management is ongoing with partial adherence to the category two eating plan and regular exercise. She has lost two pounds, achieving a weight below 180 pounds. Afternoon hunger leads to snacking rather than meals. - Continue category two eating plan - Continue exercise regimen of 60 minutes, three times a week - Encourage protein-rich snacks such as light Babybel cheese, beef or [place] jerky, almonds, and [place] pepperoni - Reinforce the importance of maintaining a balanced diet and exercise for weight management  Type 2 diabetes mellitus Type 2 diabetes is managed with Mounjaro  7.5 mg weekly, diet, exercise, and weight loss. Mounjaro  affects her appetite, causing nausea with certain foods. Monitoring hunger levels and dietary intake is advised to ensure effective management. - Continue Mounjaro  7.5 mg weekly - Monitor hunger levels and dietary intake - Reinforce the importance of diet and exercise in diabetes management   She reports a lump lateral to her left breast posterior to hr axilla, the same side as her previous breast cancer. Recent mammogram and ultrasound showed no abnormalities, and the lump was not detected. Differential diagnosis includes a benign  lipoma. Monitoring for changes in size or symptoms is advised. - Monitor the lump for changes in size or symptoms - Follow up with imaging in one year as directed by your oncologist unless changes occur     She was informed of the importance of frequent follow up visits to maximize her success with intensive lifestyle modifications for her multiple health conditions.    Louann Penton, MD

## 2024-06-06 NOTE — Therapy (Signed)
 OUTPATIENT PHYSICAL THERAPY FEMALE PELVIC TREATMENT   Patient Name: Lauren [name] MRN: [id number] DOB:12-Nov-1945, 78 y.o., female Today's Date: 06/06/2024  END OF SESSION:  PT End of Session - 06/06/24 1359     Visit Number 2    Date for PT Re-Evaluation 07/16/24    Authorization Type [employer] $10 copay    Progress Note Due on Visit 10    PT Start Time 1400    PT Stop Time 1449    PT Time Calculation (min) 49 min    Activity Tolerance Patient tolerated treatment well    Behavior During Therapy [facility] for tasks assessed/performed           Past Medical History:  Diagnosis Date   Anxiety    Arthritis    Back, Hip- right   Atypical chest pain 06/25/2021   Back pain    Cancer (HCC)    right breast   Cataract    Colitis    Coronary artery calcification 02/20/2020   Diabetes mellitus without complication (HCC)    Type II   Edema 03/05/2021   Edema of both lower extremities    Endometriosis    Family history of adverse reaction to anesthesia    Father - N/V - blood pressure; daughter N/V   Family history of breast cancer    Family history of colon cancer    Family history of kidney cancer    Family history of prostate cancer    Fatty liver    GERD (gastroesophageal reflux disease)    History of hiatal hernia    History of kidney stones    History of ulcerative colitis    Hypertension    Insomnia    Joint pain    Kidney problem    Lower extremity edema 03/05/2021   Lumbar disc disease    L4 L5   Neuropathy    Osteoarthritis    Other hyperlipidemia    Palpitations    Personal history of radiation therapy    Pneumonia    1983, 2003   Pure hypercholesterolemia 02/20/2020   PVCs (premature ventricular contractions)    benign   Umbilical hernia    Vitamin D  deficiency    Wears glasses    Past Surgical History:  Procedure Laterality Date   APPENDECTOMY     BREAST BIOPSY  08-13-2009  DR TSUEI   EXCISION LEFT NIPPLE DUCT   BREAST EXCISIONAL BIOPSY  Left    x2   BREAST EXCISIONAL BIOPSY Right    BREAST LUMPECTOMY Right 11/16/2018   invasive ductal   CHOLECYSTECTOMY  1992   COLON RESECTION  1986   ULCERATIVE COLITIS   COLON SURGERY     ESOPHAGEAL MANOMETRY N/A 06/11/2013   Procedure: ESOPHAGEAL MANOMETRY (EM);  Surgeon: Oliva FORBES Boots, MD;  Location: [facility];  Service: Endoscopy;  Laterality: N/A;   ESOPHAGOGASTRODUODENOSCOPY (EGD) WITH PROPOFOL  N/A 05/31/2016   Procedure: ESOPHAGOGASTRODUODENOSCOPY (EGD) WITH PROPOFOL ;  Surgeon: Oliva Boots, MD;  Location: [facility] ENDOSCOPY;  Service: Endoscopy;  Laterality: N/A;   EXCISION RIGHT BREAST MASS   10-20-2005  DR MAUDE YOUNG   EYE SURGERY Bilateral 2023   cataract   FRACTURE SURGERY     left arm   kidney stone removed  12/2009   KNEE ARTHROSCOPY Left    MCL   LEFT THUMB CARPOMETACARPAL JOINT SUSPENSIONPLASTY  04-06-2006  DR SISSY   LEFT WRIST ARTHROSCOPY W/ DEBRIDEMENT AND REMOVAL CYST  03-26-2004  DR SISSY   MYOMECTOMY  RADIOACTIVE SEED GUIDED PARTIAL MASTECTOMY WITH AXILLARY SENTINEL LYMPH NODE BIOPSY Right 11/16/2018   Procedure: RIGHT BREAST RADIOACTIVE SEED GUIDED PARTIAL MASTECTOMY WITH  SENTINEL  NODE BIOPSY;  Surgeon: Vernetta Berg, MD;  Location: [facility] OR;  Service: General;  Laterality: Right;   RIGHT COLECTOMY     RIGHT URETEROSCOPIC STONE EXTRACTION  12-11-2009  DR NORLEEN [facility]   TENDON RELEASE     TONSILLECTOMY     TOTAL HIP ARTHROPLASTY Right 11/26/2016   Procedure: RIGHT TOTAL HIP ARTHROPLASTY ANTERIOR APPROACH;  Surgeon: Lonni CINDERELLA Vernetta, MD;  Location: [facility] ORS;  Service: Orthopedics;  Laterality: Right;   TOTAL HIP ARTHROPLASTY Left 12/06/2023   Procedure: LEFT TOTAL HIP ARTHROPLASTY ANTERIOR APPROACH;  Surgeon: Vernetta Lonni CINDERELLA, MD;  Location: [facility] OR;  Service: Orthopedics;  Laterality: Left;   TOTAL KNEE ARTHROPLASTY Left 12/02/2017   Procedure: LEFT TOTAL KNEE ARTHROPLASTY;  Surgeon: Vernetta Lonni CINDERELLA, MD;  Location: [facility] OR[facility];  Service:  Orthopedics;  Laterality: Left;   TOTAL KNEE ARTHROPLASTY Right 08/03/2022   Procedure: RIGHT TOTAL KNEE ARTHROPLASTY;  Surgeon: Vernetta Lonni CINDERELLA, MD;  Location: [facility] OR;  Service: Orthopedics;  Laterality: Right;   WRIST SURGERY     both   Patient Active Problem List   Diagnosis Date Noted   History of iron deficiency 05/24/2024   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   Falls 05/01/2024   Iron deficiency 05/01/2024   Overactive bladder 02/22/2024   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/2023   SUI (stress urinary incontinence, female) 11/22/2023   Restless leg syndrome 09/22/2023   Other hyperlipidemia 06/08/2023   Unilateral primary osteoarthritis, left hip 05/11/2023   Depression 03/16/2023   Allergic rhinitis 12/29/2022   Seasonal affective disorder (HCC) 12/01/2022   BMI 32.0-32.9,adult 12/01/2022   Obesity, Beginning BMI 37.59 12/01/2022   OA (osteoarthritis) of knee 08/03/2022   Status post total right knee replacement 08/03/2022   Stress 07/19/2022   Other constipation 07/19/2022   Atypical chest pain 06/25/2021   Class 2 severe obesity with serious comorbidity and body mass index (BMI) of 37.0 to 37.9 in adult (HCC) 06/03/2021   Vitamin D  deficiency 06/03/2021   Other fatigue 04/22/2021   SOB (shortness of breath) on exertion 04/22/2021   Lower extremity edema 03/05/2021   Coronary artery calcification 02/20/2020   Pure hypercholesterolemia 02/20/2020   Unilateral primary osteoarthritis, right knee 01/03/2019   Genetic testing 12/07/2018   Family history of breast cancer    Family history of prostate cancer    Family history of colon cancer    Family history of kidney cancer    Malignant neoplasm of upper-outer quadrant of right breast in female, 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/09/2018   Diabetic neuropathy, type II diabetes mellitus (HCC) 11/09/2018   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/02/2017   Trochanteric bursitis, right hip  05/10/2017   Status post total replacement of right hip 11/26/2016   DDD (degenerative disc disease), 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   Low back pain 04/23/2015   Hiatal hernia 04/13/2013   Diabetes mellitus (HCC) 01/26/2013   Umbilical hernia 07/24/2012   Essential hypertension     PCP: Okey Carlin Redbird, MD  REFERRING PROVIDER: Marilynne Rosaline SAILOR, MD   REFERRING DIAG: N32.81 (ICD-10-CM) - Overactive bladder N39.3 (ICD-10-CM) - SUI (stress urinary incontinence, female)  THERAPY DIAG:  Other muscle spasm  Muscle weakness (generalized)  Abnormal posture  Unspecified lack of coordination  Other low back pain  Pain in right hip  Pain in left hip  Rationale for Evaluation and Treatment: Rehabilitation   ONSET DATE: 1 year  SUBJECTIVE:                                                                                                                                                                                           SUBJECTIVE STATEMENT: Pt states that her Lt hip is not bothering her quite as much, but her low back and hips are really bothering her.    PAIN: 06/06/24 Are you having pain? Yes NPRS scale: 7/10 at worst; 3/10 currently Pain location: low back pain; Lt hip soreness; Rt iliopsoas issue   Pain type: aching Pain description: intermittent   Aggravating factors: lifting Rt LE Relieving factors: pain medication 3x/day (would love to get off of)  PRECAUTIONS: None  RED FLAGS: None   WEIGHT BEARING RESTRICTIONS: No  FALLS:  Has patient fallen in last 6 months? Yes. Number of falls 1 fall, 3 weeks ago  OCCUPATION: Geophysical data processor   ACTIVITY LEVEL : not currently exercising   PLOF: Independent  PATIENT GOALS: to get better control over bladder  PERTINENT HISTORY:  Appendectomy, Rt breast lumpectomy, colon surgery, cholecystectomy, Lt knee arthroscopy to repair MCL, myomectomy, Rt partial mastectomy 2020, Rt colectomy, Rt THA 2018, Lt THA 2025,  Lt TKA 2019, Rt TKA 2023, umbilical hernia  BOWEL MOVEMENT: Pain with bowel movement: No Type of bowel movement:Frequency 1-2x/day and Strain yes Fully empty rectum: Yes: mostly Leakage: No Pads: Yes: see below  Fiber supplement/laxative Yes - fiber gummies   URINATION: Pain with urination: No Fully empty bladder: No - not always, some post-void dribbling or she walks around with feeling that there is still a drop or two still there (almost feels like she needs to have an orgasm) Stream: WNL Urgency: has mostly gone away Frequency: usually no trips at night anymore; every 2 hours during the day  Fluid Intake:  Leakage: Coughing, Sneezing, Laughing, Lifting, and Bending forward Pads: Yes: usually, but not more than a spot or two  INTERCOURSE:  Not sexually active anymore - widow as of 3 years ago  PREGNANCY: Vaginal deliveries 2 Tearing No Episiotomy No C-section deliveries 0 Currently pregnant No  PROLAPSE: None   OBJECTIVE:  Note: Objective measures were completed at Evaluation unless otherwise noted.   PATIENT SURVEYS:   PFIQ-7: 61  COGNITION: Overall cognitive status: Within functional limits for tasks assessed     SENSATION: Light touch: Appears intact   FUNCTIONAL TESTS:  Squat: bil UE support Single leg stance:  Rt: pelvic drop with UE support  Lt: pelvic drop with UE support Curl-up test: distortion  GAIT: Assistive device utilized: None Comments: bil trendelenburg   POSTURE: rounded shoulders, forward head, decreased lumbar lordosis, increased thoracic kyphosis, posterior pelvic tilt, and Rt posterior rotation   LUMBARAROM/PROM:  A/PROM A/PROM  Eval (% available)  Flexion 50  Extension 50  Right lateral flexion 50, pain  Left lateral flexion 50  Right rotation 50  Left rotation 50   (Blank rows = not tested)  PALPATION:   General: tightness throughout bil lumbar paraspinals; bil scar tissue restriction in hip incisions  Pelvic  Alignment: Rt posterior rotation   Abdominal: apical breathing pattern, decreased rib cage mobility                 External Perineal Exam: red and dry                             Internal Pelvic Floor: no superficial discomfort, mild discomfort in Lt deep layers, significant tightness and pain in deep Rt pelvic floor/obturator/piriformis  Patient confirms identification and approves PT to assess internal pelvic floor and treatment Yes  PELVIC MMT:   MMT eval  Vaginal 3/5, 2 second endurance, 5 repeat contraction  Diastasis Recti 2-3 finger width separation   (Blank rows = not tested)        TONE: High, Rt>Lt  PROLAPSE: Grade 1 anterior vaginal wall laxity  TODAY'S TREATMENT:                                                                                                                              DATE:  06/06/24 Trigger Point Dry Needling  Initial Treatment: Pt instructed on Dry Needling rational, procedures, and possible side effects. Pt instructed to expect mild to moderate muscle soreness later in the day and/or into the next day.  Pt instructed in methods to reduce muscle soreness. Pt instructed to continue prescribed HEP. Patient was educated on signs and symptoms of infection and other risk factors and advised to seek medical attention should they occur.  Patient verbalized understanding of these instructions and education.   Patient Verbal Consent Given: Yes Education Handout Provided: Yes Muscles Treated: Bil glutes, tensor fascia latte, piriformis Electrical Stimulation Performed: No Treatment Response/Outcome: palpable twitch response   Manual: Soft tissue mobilization to bil posterolateral hips  Rt hip scar tissue mobilization Pt provides verbal consent for internal vaginal/rectal pelvic floor exam. Bil internal vaginal levator ani release  Neuromuscular re-education: Internal vaginal pelvic floor muscle contraction training Quick flicks Urge drill The  knack   03/07/24  EVAL  Neuromuscular re-education: Butterfly 10 breaths Standing child's pose 10 breaths Standing cat cow 5x Diaphragmatic breathing  Lower trunk rotation  Therapeutic activities: Squatty potty/relaxed toilet position Exhaling with pushing Getting suction cup to start working on Rt hip at home     PATIENT EDUCATION:  Education details: See above Person educated: Patient Education method: Programmer, multimedia, Facilities manager, Actor cues, Verbal cues, and Automotive engineer  comprehension: verbalized understanding  HOME EXERCISE PROGRAM: [id number]  ASSESSMENT:  CLINICAL IMPRESSION: Patient is a 78 y.o. female who was seen today for physical therapy evaluation and treatment for stress urinary incontinence, constipation, and low back pain. Pt was taught double-voiding, the knack, and urge drill to help with urgency, complete emptying, and stress urinary incontinence. Internal pelvic floor muscle release performed to tolerance to help release tension that is likely connected to pain and urinary urgency. She ended up having significant improvements in pelvic floor muscle restriction, but we did end up releasing small amount in bil levator ani. Dry needling also performed to bil glutes, piriforms, and tensor fascia latte with notable twitch response, but good release of muscular tension. Scar tissue mobilization performed to bil hips to help improve mobility. Cat cow taken off HEP due to difficulty with coordination and not having time to help improve this session. She will continue to benefit from skilled PT intervention in order to decrease stress urinary incontinence, improve constipation, decrease low back/hip pain, address impairments, and improve quality of life.   OBJECTIVE IMPAIRMENTS: decreased activity tolerance, decreased coordination, decreased endurance, decreased mobility, decreased ROM, decreased strength, increased fascial restrictions, increased muscle spasms, impaired  flexibility, impaired tone, improper body mechanics, postural dysfunction, and pain.   ACTIVITY LIMITATIONS: lifting, bending, and continence  PARTICIPATION LIMITATIONS: cleaning, laundry, and community activity  PERSONAL FACTORS: 1-2 comorbidities: medical history are also affecting patient's functional outcome.   REHAB POTENTIAL: Good  CLINICAL DECISION MAKING: Evolving/moderate complexity  EVALUATION COMPLEXITY: Moderate   GOALS: Goals reviewed with patient? Yes  SHORT TERM GOALS: Updated 06/06/24   Pt will be independent with HEP.   Baseline: Goal status: IN PROGRESS 06/06/24  2.  Pt will decrease pain by 25% in order to be able to perform regular exercise program to decrease urinary incontinence and pain.  Baseline:  Goal status: IN PROGRESS 06/06/24  3.  Pt will have no tenderness in bil levator ani in order to be able to ambulate and move low back/pelvis in daily activities without pain.  Baseline:  Goal status: IN PROGRESS 06/06/24  4.  Pt will increase pelvic floor muscle strength to 4/5 in order to decrease urinary incontinence and pad use.  Baseline:  Goal status: IN PROGRESS 06/06/24  5.  Pt will be independent with the knack, urge suppression technique, and double voiding in order to improve bladder habits and decrease urinary incontinence.   Baseline:  Goal status: IN PROGRESS 06/06/24  6.  Pt will be independent with use of squatty potty, relaxed toileting mechanics, and improved bowel movement techniques in order to increase ease of bowel movements and complete evacuation.   Baseline:  Goal status: IN PROGRESS 06/06/24  LONG TERM GOALS: Updated 06/06/24  Pt will be independent with advanced HEP.   Baseline:  Goal status: IN PROGRESS 06/06/24  2.  Pt will decrease pain by 75% in order to be able to perform regular exercise program to decrease urinary incontinence and pain.  Baseline:  Goal status: IN PROGRESS 06/06/24  3.  Pt will increase pelvic floor muscle  endurance to greater than 10 seconds in order to decrease pad use to 0 a day.  Baseline:  Goal status: IN PROGRESS 06/06/24  4.  Pt will report no leaks with laughing, coughing, sneezing, bending, and lifting in order to improve comfort with interpersonal relationships and community activities.   Baseline:  Goal status: IN PROGRESS 06/06/24  5.  Pt will be able to lift 10lb kettle  bell 5 times without pain or urinary incontinence.  Baseline:  Goal status: IN PROGRESS 06/06/24  6.  Pt will be able to stand on single LE without pelvic drop in order to improve fall risk, decrease low back pain, and help maintain progress with urinary incontinence.  Baseline:  Goal status: IN PROGRESS 06/06/24  PLAN:  PT FREQUENCY: 1-2x/week  PT DURATION: 12 weeks  PLANNED INTERVENTIONS: 97110-Therapeutic exercises, 97530- Therapeutic activity, 97112- Neuromuscular re-education, 97535- Self Care, [id number]- Manual therapy, Dry Needling, and Biofeedback  PLAN FOR NEXT SESSION: teach the knack and double voiding; internal pelvic floor muscle release to tolerance; teach negative pressure soft tissue mobilization with suction cup to scar tissue on Rt hip; manual techniques to scar tissue    Josette Mares, PT, DPT08/06/252:49 PM

## 2024-06-06 NOTE — Patient Instructions (Signed)
Urge Incontinence  Ideal urination frequency is every 2-4 wakeful hours, which equates to 5-8 times within a 24-hour period.   Urge incontinence is leakage that occurs when the bladder muscle contracts, creating a sudden need to go before getting to the bathroom.   Going too often when your bladder isn't actually full can disrupt the body's automatic signals to store and hold urine longer, which will increase urgency/frequency.  In this case, the bladder "is running the show" and strategies can be learned to retrain this pattern.   One should be able to control the first urge to urinate, at around 130m.  The bladder can hold up to a "grande latte," or 4028m To help you gain control, practice the Urge Drill below when urgency strikes.  This drill will help retrain your bladder signals and allow you to store and hold urine longer.  The overall goal is to stretch out your time between voids to reach a more manageable voiding schedule.    Practice your "quick flicks" often throughout the day (each waking hour) even when you don't need feel the urge to go.  This will help strengthen your pelvic floor muscles, making them more effective in controlling leakage.  Urge Drill  When you feel an urge to go, follow these steps to regain control: Stop what you are doing and be still Take one deep breath, directing your air into your abdomen Think an affirming thought, such as "I've got this." Do 5 quick flicks of your pelvic floor Walk with control to the bathroom to void, or delay voiding     The knack: Use this technique while coughing, laughing, sneezing, or with any activities that causes you to leak urine a little. Right before you perform one of these activities that increase pressure in the abdomen and pushes a little urine out, perform a pelvic floor muscle contraction and hold. If that does not completely stop the leaking, try tightening your thighs together in addition to performing a  pelvic floor muscle contraction. Make sure you are not trying to stifle a cough, sneeze, or laugh; allow these activities in full as it will cause less pressure down into the bladder and pelvic floor muscles.      Double-voiding:  This technique is to help with post-void dribbling, or leaking a little bit when you stand up right after urinating.  Use relaxed toileting mechanics to urinate as much as you feel like you have to without straining.  Sit back upright from leaning forward and relax this way for 10-20 seconds.  Lean forward again to finish voiding any amount more.    Lauren [name] South Rivers[place] LaneSuHeartwellrNew [place] [address]hone # [phone]

## 2024-06-07 NOTE — Telephone Encounter (Signed)
 Called pt and inform her that she was approve.

## 2024-06-11 ENCOUNTER — Ambulatory Visit

## 2024-06-11 DIAGNOSIS — R279 Unspecified lack of coordination: Secondary | ICD-10-CM

## 2024-06-11 DIAGNOSIS — M25551 Pain in right hip: Secondary | ICD-10-CM

## 2024-06-11 DIAGNOSIS — M62838 Other muscle spasm: Secondary | ICD-10-CM

## 2024-06-11 DIAGNOSIS — M25552 Pain in left hip: Secondary | ICD-10-CM

## 2024-06-11 DIAGNOSIS — M5459 Other low back pain: Secondary | ICD-10-CM

## 2024-06-11 DIAGNOSIS — M6281 Muscle weakness (generalized): Secondary | ICD-10-CM

## 2024-06-11 DIAGNOSIS — R293 Abnormal posture: Secondary | ICD-10-CM

## 2024-06-11 NOTE — Therapy (Signed)
 OUTPATIENT PHYSICAL THERAPY FEMALE PELVIC TREATMENT   Patient Name: Lauren [name] MRN: [id number] DOB:[birth year], 78 y.o., female Today's Date: 06/11/2024  END OF SESSION:  PT End of Session - 06/11/24 1235     Visit Number 3    Date for PT Re-Evaluation 07/16/24    Authorization Type [employer] $10 copay    Progress Note Due on Visit 10    PT Start Time 1230    PT Stop Time 1312    PT Time Calculation (min) 42 min    Activity Tolerance Patient tolerated treatment well    Behavior During Therapy [facility] for tasks assessed/performed            Past Medical History:  Diagnosis Date   Anxiety    Arthritis    Back, Hip- right   Atypical chest pain 06/25/2021   Back pain    Cancer (HCC)    right breast   Cataract    Colitis    Coronary artery calcification 02/20/2020   Diabetes mellitus without complication (HCC)    Type II   Edema 03/05/2021   Edema of both lower extremities    Endometriosis    Family history of adverse reaction to anesthesia    Father - N/V - blood pressure; daughter N/V   Family history of breast cancer    Family history of colon cancer    Family history of kidney cancer    Family history of prostate cancer    Fatty liver    GERD (gastroesophageal reflux disease)    History of hiatal hernia    History of kidney stones    History of ulcerative colitis    Hypertension    Insomnia    Joint pain    Kidney problem    Lower extremity edema 03/05/2021   Lumbar disc disease    L4 L5   Neuropathy    Osteoarthritis    Other hyperlipidemia    Palpitations    Personal history of radiation therapy    Pneumonia    1983, 2003   Pure hypercholesterolemia 02/20/2020   PVCs (premature ventricular contractions)    benign   Umbilical hernia    Vitamin D  deficiency    Wears glasses    Past Surgical History:  Procedure Laterality Date   APPENDECTOMY     BREAST BIOPSY  08-13-2009  DR TSUEI   EXCISION LEFT NIPPLE DUCT   BREAST EXCISIONAL BIOPSY  Left    x2   BREAST EXCISIONAL BIOPSY Right    BREAST LUMPECTOMY Right 11/16/2018   invasive ductal   CHOLECYSTECTOMY  1992   COLON RESECTION  1986   ULCERATIVE COLITIS   COLON SURGERY     ESOPHAGEAL MANOMETRY N/A 06/11/2013   Procedure: ESOPHAGEAL MANOMETRY (EM);  Surgeon: Oliva FORBES Boots, MD;  Location: [facility];  Service: Endoscopy;  Laterality: N/A;   ESOPHAGOGASTRODUODENOSCOPY (EGD) WITH PROPOFOL  N/A 05/31/2016   Procedure: ESOPHAGOGASTRODUODENOSCOPY (EGD) WITH PROPOFOL ;  Surgeon: Oliva Boots, MD;  Location: [facility] ENDOSCOPY;  Service: Endoscopy;  Laterality: N/A;   EXCISION RIGHT BREAST MASS   10-20-2005  DR MAUDE YOUNG   EYE SURGERY Bilateral 2023   cataract   FRACTURE SURGERY     left arm   kidney stone removed  12/2009   KNEE ARTHROSCOPY Left    MCL   LEFT THUMB CARPOMETACARPAL JOINT SUSPENSIONPLASTY  04-06-2006  DR SISSY   LEFT WRIST ARTHROSCOPY W/ DEBRIDEMENT AND REMOVAL CYST  03-26-2004  DR SISSY   MYOMECTOMY  RADIOACTIVE SEED GUIDED PARTIAL MASTECTOMY WITH AXILLARY SENTINEL LYMPH NODE BIOPSY Right 11/16/2018   Procedure: RIGHT BREAST RADIOACTIVE SEED GUIDED PARTIAL MASTECTOMY WITH  SENTINEL  NODE BIOPSY;  Surgeon: Vernetta Berg, MD;  Location: [facility] OR;  Service: General;  Laterality: Right;   RIGHT COLECTOMY     RIGHT URETEROSCOPIC STONE EXTRACTION  12-11-2009  DR NORLEEN [facility]   TENDON RELEASE     TONSILLECTOMY     TOTAL HIP ARTHROPLASTY Right 11/26/2016   Procedure: RIGHT TOTAL HIP ARTHROPLASTY ANTERIOR APPROACH;  Surgeon: Lonni CINDERELLA Vernetta, MD;  Location: [facility];  Service: Orthopedics;  Laterality: Right;   TOTAL HIP ARTHROPLASTY Left 12/06/2023   Procedure: LEFT TOTAL HIP ARTHROPLASTY ANTERIOR APPROACH;  Surgeon: Vernetta Lonni CINDERELLA, MD;  Location: [facility] OR;  Service: Orthopedics;  Laterality: Left;   TOTAL KNEE ARTHROPLASTY Left 12/02/2017   Procedure: LEFT TOTAL KNEE ARTHROPLASTY;  Surgeon: Vernetta Lonni CINDERELLA, MD;  Location: [facility];  Service:  Orthopedics;  Laterality: Left;   TOTAL KNEE ARTHROPLASTY Right 08/03/2022   Procedure: RIGHT TOTAL KNEE ARTHROPLASTY;  Surgeon: Vernetta Lonni CINDERELLA, MD;  Location: [facility] OR;  Service: Orthopedics;  Laterality: Right;   WRIST SURGERY     both   Patient Active Problem List   Diagnosis Date Noted   History of iron deficiency 05/24/2024   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   Falls 05/01/2024   Iron deficiency 05/01/2024   Overactive bladder 02/22/2024   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/2023   SUI (stress urinary incontinence, female) 11/22/2023   Restless leg syndrome 09/22/2023   Other hyperlipidemia 06/08/2023   Unilateral primary osteoarthritis, left hip 05/11/2023   Depression 03/16/2023   Allergic rhinitis 12/29/2022   Seasonal affective disorder (HCC) 12/01/2022   BMI 32.0-32.9,adult 12/01/2022   Obesity, Beginning BMI 37.59 12/01/2022   OA (osteoarthritis) of knee 08/03/2022   Status post total right knee replacement 08/03/2022   Stress 07/19/2022   Other constipation 07/19/2022   Atypical chest pain 06/25/2021   Class 2 severe obesity with serious comorbidity and body mass index (BMI) of 37.0 to 37.9 in adult (HCC) 06/03/2021   Vitamin D  deficiency 06/03/2021   Other fatigue 04/22/2021   SOB (shortness of breath) on exertion 04/22/2021   Lower extremity edema 03/05/2021   Coronary artery calcification 02/20/2020   Pure hypercholesterolemia 02/20/2020   Unilateral primary osteoarthritis, right knee 01/03/2019   Genetic testing 12/07/2018   Family history of breast cancer    Family history of prostate cancer    Family history of colon cancer    Family history of kidney cancer    Malignant neoplasm of upper-outer quadrant of right breast in female, 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/09/2018   Diabetic neuropathy, type II diabetes mellitus (HCC) 11/09/2018   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/02/2017   Trochanteric bursitis, right hip  05/10/2017   Status post total replacement of right hip 11/26/2016   DDD (degenerative disc disease), 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   Low back pain 04/23/2015   Hiatal hernia 04/13/2013   Diabetes mellitus (HCC) 01/26/2013   Umbilical hernia 07/24/2012   Essential hypertension     PCP: Okey Carlin Redbird, MD  REFERRING PROVIDER: Marilynne Rosaline SAILOR, MD   REFERRING DIAG: N32.81 (ICD-10-CM) - Overactive bladder N39.3 (ICD-10-CM) - SUI (stress urinary incontinence, female)  THERAPY DIAG:  Other muscle spasm  Muscle weakness (generalized)  Abnormal posture  Unspecified lack of coordination  Other low back pain  Pain in right hip  Pain in left hip  Rationale for Evaluation and Treatment: Rehabilitation   ONSET DATE: 1 year  SUBJECTIVE:                                                                                                                                                                                           SUBJECTIVE STATEMENT: Pt states that she has had a strange couple of days. She has changed the time in which she takes her myrbetriq  and feels like it does not help as much taking it with all of her other medications. Starting over the weekend, she starting feeling like she had a UTI, but symptoms got better throughout the day. Pt states that she was very sore after dry needling, but the pain was better. She is having a lot of pain in her Rt hip and low back pain.    PAIN: 06/11/24 Are you having pain? Yes NPRS scale: 5/10 Pain location: low back pain; Lt hip soreness; Rt iliopsoas issue   Pain type: aching Pain description: intermittent   Aggravating factors: lifting Rt LE Relieving factors: pain medication 3x/day (would love to get off of)  PRECAUTIONS: None  RED FLAGS: None   WEIGHT BEARING RESTRICTIONS: No  FALLS:  Has patient fallen in last 6 months? Yes. Number of falls 1 fall, 3 weeks ago  OCCUPATION: Geophysical data processor   ACTIVITY LEVEL  : not currently exercising   PLOF: Independent  PATIENT GOALS: to get better control over bladder  PERTINENT HISTORY:  Appendectomy, Rt breast lumpectomy, colon surgery, cholecystectomy, Lt knee arthroscopy to repair MCL, myomectomy, Rt partial mastectomy 2020, Rt colectomy, Rt THA 2018, Lt THA 2025, Lt TKA 2019, Rt TKA 2023, umbilical hernia  BOWEL MOVEMENT: Pain with bowel movement: No Type of bowel movement:Frequency 1-2x/day and Strain yes Fully empty rectum: Yes: mostly Leakage: No Pads: Yes: see below  Fiber supplement/laxative Yes - fiber gummies   URINATION: Pain with urination: No Fully empty bladder: No - not always, some post-void dribbling or she walks around with feeling that there is still a drop or two still there (almost feels like she needs to have an orgasm) Stream: WNL Urgency: has mostly gone away Frequency: usually no trips at night anymore; every 2 hours during the day  Fluid Intake:  Leakage: Coughing, Sneezing, Laughing, Lifting, and Bending forward Pads: Yes: usually, but not more than a spot or two  INTERCOURSE:  Not sexually active anymore - widow as of 3 years ago  PREGNANCY: Vaginal deliveries 2 Tearing No Episiotomy No C-section deliveries 0 Currently pregnant No  PROLAPSE: None   OBJECTIVE:  Note: Objective measures were completed at Evaluation unless  otherwise noted.   PATIENT SURVEYS:   PFIQ-7: 67  COGNITION: Overall cognitive status: Within functional limits for tasks assessed     SENSATION: Light touch: Appears intact   FUNCTIONAL TESTS:  Squat: bil UE support Single leg stance:  Rt: pelvic drop with UE support  Lt: pelvic drop with UE support Curl-up test: distortion   GAIT: Assistive device utilized: None Comments: bil trendelenburg   POSTURE: rounded shoulders, forward head, decreased lumbar lordosis, increased thoracic kyphosis, posterior pelvic tilt, and Rt posterior rotation   LUMBARAROM/PROM:  A/PROM  A/PROM  Eval (% available)  Flexion 50  Extension 50  Right lateral flexion 50, pain  Left lateral flexion 50  Right rotation 50  Left rotation 50   (Blank rows = not tested)  PALPATION:   General: tightness throughout bil lumbar paraspinals; bil scar tissue restriction in hip incisions  Pelvic Alignment: Rt posterior rotation   Abdominal: apical breathing pattern, decreased rib cage mobility                 External Perineal Exam: red and dry                             Internal Pelvic Floor: no superficial discomfort, mild discomfort in Lt deep layers, significant tightness and pain in deep Rt pelvic floor/obturator/piriformis  Patient confirms identification and approves PT to assess internal pelvic floor and treatment Yes  PELVIC MMT:   MMT eval  Vaginal 3/5, 2 second endurance, 5 repeat contraction  Diastasis Recti 2-3 finger width separation   (Blank rows = not tested)        TONE: High, Rt>Lt  PROLAPSE: Grade 1 anterior vaginal wall laxity  TODAY'S TREATMENT:                                                                                                                              DATE:  06/11/24 Manual: In Lt sidelying with pillow under Lt hip Soft tissue mobilization to Rt flank, quadratus lumborum, lumbar paraspinals, glutes Scar tissue mobilization to Rt hip incision and surrounding area Negative pressure soft tissue mobilization to Rt hip Neuromuscular re-education: Transversus abdominus training with multimodal cues for improved motor control and breath coordination Bil supine UE ball press with transversus abdominus and pelvic floor muscle contractions and breath coordination 10x Supine hip adduction ball press with transversus abdominus and pelvic floor muscle contractions and breath coordination 10x Bridge with hip adduction, transversus abdominus, and pelvic floor muscle 2 x 10 Supine hip abduction + transversus abdominus  + pelvic floor muscle red  band 2 x 10 Supine resisted march + transversus abdominus + pelvic floor muscle red band 2 x 10   06/06/24 Trigger Point Dry Needling  Initial Treatment: Pt instructed on Dry Needling rational, procedures, and possible side effects. Pt instructed to expect mild to moderate muscle soreness later in the day and/or into the next  day.  Pt instructed in methods to reduce muscle soreness. Pt instructed to continue prescribed HEP. Patient was educated on signs and symptoms of infection and other risk factors and advised to seek medical attention should they occur.  Patient verbalized understanding of these instructions and education.   Patient Verbal Consent Given: Yes Education Handout Provided: Yes Muscles Treated: Bil glutes, tensor fascia latte, piriformis Electrical Stimulation Performed: No Treatment Response/Outcome: palpable twitch response   Manual: Soft tissue mobilization to bil posterolateral hips  Rt hip scar tissue mobilization Pt provides verbal consent for internal vaginal/rectal pelvic floor exam. Bil internal vaginal levator ani release  Neuromuscular re-education: Internal vaginal pelvic floor muscle contraction training Quick flicks Urge drill The knack   03/07/24  EVAL  Neuromuscular re-education: Butterfly 10 breaths Standing child's pose 10 breaths Standing cat cow 5x Diaphragmatic breathing  Lower trunk rotation  Therapeutic activities: Squatty potty/relaxed toilet position Exhaling with pushing Getting suction cup to start working on Rt hip at home     PATIENT EDUCATION:  Education details: See above Person educated: Patient Education method: Programmer, multimedia, Facilities manager, Actor cues, Verbal cues, and Handouts Education comprehension: verbalized understanding  HOME EXERCISE PROGRAM: [id number]  ASSESSMENT:  CLINICAL IMPRESSION: Patient is a 78 y.o. female who was seen today for physical therapy evaluation and treatment for stress urinary  incontinence, constipation, and low back pain. Pt has been having difficult time with increased urgency in the last week. She had decrease in pain after last session, but then pulled something in her Rt low back this morning. Believe that her muscle pain is from rt obliques and quadratus lumborum and is likely due to her entire core/hip muscles on that side activating less effectively due to scar tissue in Rt hip. She tolerated manual techniques to this area very well with improved mobility and decreased pain. To continue working on mobility of core, scar tissue, and pelvic floor muscles with improved function, we started working on core training and exercise progressions. She had singificant difficulty with bridges, but they did not cause pain. HEP updated. She will continue to benefit from skilled PT intervention in order to decrease stress urinary incontinence, improve constipation, decrease low back/hip pain, address impairments, and improve quality of life.   OBJECTIVE IMPAIRMENTS: decreased activity tolerance, decreased coordination, decreased endurance, decreased mobility, decreased ROM, decreased strength, increased fascial restrictions, increased muscle spasms, impaired flexibility, impaired tone, improper body mechanics, postural dysfunction, and pain.   ACTIVITY LIMITATIONS: lifting, bending, and continence  PARTICIPATION LIMITATIONS: cleaning, laundry, and community activity  PERSONAL FACTORS: 1-2 comorbidities: medical history are also affecting patient's functional outcome.   REHAB POTENTIAL: Good  CLINICAL DECISION MAKING: Evolving/moderate complexity  EVALUATION COMPLEXITY: Moderate   GOALS: Goals reviewed with patient? Yes  SHORT TERM GOALS: Updated 06/06/24   Pt will be independent with HEP.   Baseline: Goal status: IN PROGRESS 06/06/24  2.  Pt will decrease pain by 25% in order to be able to perform regular exercise program to decrease urinary incontinence and pain.   Baseline:  Goal status: IN PROGRESS 06/06/24  3.  Pt will have no tenderness in bil levator ani in order to be able to ambulate and move low back/pelvis in daily activities without pain.  Baseline:  Goal status: IN PROGRESS 06/06/24  4.  Pt will increase pelvic floor muscle strength to 4/5 in order to decrease urinary incontinence and pad use.  Baseline:  Goal status: IN PROGRESS 06/06/24  5.  Pt will be independent with the knack,  urge suppression technique, and double voiding in order to improve bladder habits and decrease urinary incontinence.   Baseline:  Goal status: IN PROGRESS 06/06/24  6.  Pt will be independent with use of squatty potty, relaxed toileting mechanics, and improved bowel movement techniques in order to increase ease of bowel movements and complete evacuation.   Baseline:  Goal status: IN PROGRESS 06/06/24  LONG TERM GOALS: Updated 06/06/24  Pt will be independent with advanced HEP.   Baseline:  Goal status: IN PROGRESS 06/06/24  2.  Pt will decrease pain by 75% in order to be able to perform regular exercise program to decrease urinary incontinence and pain.  Baseline:  Goal status: IN PROGRESS 06/06/24  3.  Pt will increase pelvic floor muscle endurance to greater than 10 seconds in order to decrease pad use to 0 a day.  Baseline:  Goal status: IN PROGRESS 06/06/24  4.  Pt will report no leaks with laughing, coughing, sneezing, bending, and lifting in order to improve comfort with interpersonal relationships and community activities.   Baseline:  Goal status: IN PROGRESS 06/06/24  5.  Pt will be able to lift 10lb kettle bell 5 times without pain or urinary incontinence.  Baseline:  Goal status: IN PROGRESS 06/06/24  6.  Pt will be able to stand on single LE without pelvic drop in order to improve fall risk, decrease low back pain, and help maintain progress with urinary incontinence.  Baseline:  Goal status: IN PROGRESS 06/06/24  PLAN:  PT FREQUENCY:  1-2x/week  PT DURATION: 12 weeks  PLANNED INTERVENTIONS: 97110-Therapeutic exercises, 97530- Therapeutic activity, 97112- Neuromuscular re-education, 97535- Self Care, [id number]- Manual therapy, Dry Needling, and Biofeedback  PLAN FOR NEXT SESSION: teach the knack and double voiding; internal pelvic floor muscle release to tolerance; teach negative pressure soft tissue mobilization with suction cup to scar tissue on Rt hip; manual techniques to scar tissue    Josette Mares, PT, DPT08/11/251:43 PM

## 2024-06-11 NOTE — Telephone Encounter (Signed)
 NPSG HTA shara: [id number] (exp. 05/25/24 to 08/23/24)

## 2024-06-11 NOTE — Telephone Encounter (Signed)
 Spoke with the patient.  NPSG HTA shara: [id number] (exp. 05/25/24 to 08/23/24) * pt is aware to bring her night time medication*   She is scheduled at [facility] for 07/12/2024 at 8 pm.  Mailed packet and sent mychart

## 2024-06-12 ENCOUNTER — Encounter: Payer: Self-pay | Admitting: Hematology and Oncology

## 2024-06-12 ENCOUNTER — Encounter: Payer: Self-pay | Admitting: Neurology

## 2024-06-12 ENCOUNTER — Inpatient Hospital Stay: Attending: Hematology and Oncology | Admitting: Hematology and Oncology

## 2024-06-12 DIAGNOSIS — Z1721 Progesterone receptor positive status: Secondary | ICD-10-CM

## 2024-06-12 DIAGNOSIS — Z1732 Human epidermal growth factor receptor 2 negative status: Secondary | ICD-10-CM | POA: Diagnosis not present

## 2024-06-12 DIAGNOSIS — Z170 Estrogen receptor positive status [ER+]: Secondary | ICD-10-CM | POA: Diagnosis not present

## 2024-06-12 DIAGNOSIS — Z923 Personal history of irradiation: Secondary | ICD-10-CM | POA: Diagnosis not present

## 2024-06-12 DIAGNOSIS — C50411 Malignant neoplasm of upper-outer quadrant of right female breast: Secondary | ICD-10-CM

## 2024-06-12 NOTE — Telephone Encounter (Signed)
-----   Message from [place] sent at 06/04/2024  2:16 PM EDT ----- Can u share the mammogram results. If she continues to feel the area and its bothering her, we can send her back to the breast surgeon for further investigation. ----- Message ----- From: Interface, Rad Results In Sent: 06/04/2024  12:53 PM EDT To: Amber Stalls, MD

## 2024-06-12 NOTE — Progress Notes (Signed)
 [facility]  Telephone:([phone]) [phone] [phone]     ID: Lauren [name] DOB: 20-Aug-1946  MR#: [id number]  RDW#:[id number]  Patient Care Team: Okey Carlin Redbird, MD as PCP - General (Family Medicine) Raford Riggs, MD as PCP - Cardiology (Cardiology) Vernetta Lonni GRADE, MD as Consulting Physician (Orthopedic Surgery) Dewey Rush, MD as Consulting Physician (Radiation Oncology) Neysa Inocente PARAS, NP (Inactive) as Nurse Practitioner (Obstetrics and Gynecology) Rosalie Kitchens, MD as Consulting Physician (Gastroenterology) Vernetta Berg, MD as Consulting Physician (General Surgery) Verdon Louann BIRCH, MD as Consulting Physician (Family Medicine) OTHER MD:  CHIEF COMPLAINT: Estrogen receptor positive breast cancer  CURRENT TREATMENT: Observation  INTERVAL HISTORY:  Lauren [name] returns today for telephone follow up to review MM and US  results.  COVID 19 VACCINATION STATUS: Pfizer x2   HISTORY OF CURRENT ILLNESS: From the original intake note:  Lauren [name] had routine screening mammography on 10/04/2018 showing a possible abnormality in the right breast. She underwent unilateral right diagnostic mammography with tomography and right breast ultrasonography at [facility] on 10/12/2018 showing: Breast Density Category C. Spot compression tomosynthesis images through the upper outer quadrant of the right breast demonstrates a persistent irregular mass with indistinct margins measuring approximately 1 cm. On physical exam, no definite palpable mass is identified in the upper-outer right breast. Targeted ultrasound is performed, showing an irregular hypoechoic shadowing mass at 10:30 oclock, 6 cm from the nipple measuring 9 x 8 x 9 mm. Ultrasound of the right axilla demonstrates multiple normal-appearing lymph nodes.  Accordingly on 10/18/2018 she proceeded to biopsy of the right breast area in question. The pathology from this procedure showed ([id number]): invasive  ductal carcinoma, grade II. Prognostic indicators significant for: estrogen receptor, 100% positive and progesterone receptor, 20% positive, both with strong staining intensity. Proliferation marker Ki67 at 5%. HER2 equivocal (2+) by immunohistochemisty but negative by fluorescent in situ hybridization with a signals ratio 1.26 and number per cell 2.40.   The patient's subsequent history is as detailed below.   PAST MEDICAL HISTORY: Past Medical History:  Diagnosis Date   Anxiety    Arthritis    Back, Hip- right   Atypical chest pain 06/25/2021   Back pain    Cancer (HCC)    right breast   Cataract    Colitis    Coronary artery calcification 02/20/2020   Diabetes mellitus without complication (HCC)    Type II   Edema 03/05/2021   Edema of both lower extremities    Endometriosis    Family history of adverse reaction to anesthesia    Father - N/V - blood pressure; daughter N/V   Family history of breast cancer    Family history of colon cancer    Family history of kidney cancer    Family history of prostate cancer    Fatty liver    GERD (gastroesophageal reflux disease)    History of hiatal hernia    History of kidney stones    History of ulcerative colitis    Hypertension    Insomnia    Joint pain    Kidney problem    Lower extremity edema 03/05/2021   Lumbar disc disease    L4 L5   Neuropathy    Osteoarthritis    Other hyperlipidemia    Palpitations    Personal history of radiation therapy    Pneumonia    1983, 2003   Pure hypercholesterolemia 02/20/2020   PVCs (premature ventricular contractions)    benign   Umbilical hernia  Vitamin D  deficiency    Wears glasses     PAST SURGICAL HISTORY: Past Surgical History:  Procedure Laterality Date   APPENDECTOMY     BREAST BIOPSY  08-13-2009  DR TSUEI   EXCISION LEFT NIPPLE DUCT   BREAST EXCISIONAL BIOPSY Left    x2   BREAST EXCISIONAL BIOPSY Right    BREAST LUMPECTOMY Right 11/16/2018   invasive ductal    CHOLECYSTECTOMY  1992   COLON RESECTION  1986   ULCERATIVE COLITIS   COLON SURGERY     ESOPHAGEAL MANOMETRY N/A 06/11/2013   Procedure: ESOPHAGEAL MANOMETRY (EM);  Surgeon: Oliva FORBES Boots, MD;  Location: [facility];  Service: Endoscopy;  Laterality: N/A;   ESOPHAGOGASTRODUODENOSCOPY (EGD) WITH PROPOFOL  N/A 05/31/2016   Procedure: ESOPHAGOGASTRODUODENOSCOPY (EGD) WITH PROPOFOL ;  Surgeon: Oliva Boots, MD;  Location: [facility] ENDOSCOPY;  Service: Endoscopy;  Laterality: N/A;   EXCISION RIGHT BREAST MASS   10-20-2005  DR MAUDE YOUNG   EYE SURGERY Bilateral 2023   cataract   FRACTURE SURGERY     left arm   kidney stone removed  12/2009   KNEE ARTHROSCOPY Left    MCL   LEFT THUMB CARPOMETACARPAL JOINT SUSPENSIONPLASTY  04-06-2006  DR SISSY   LEFT WRIST ARTHROSCOPY W/ DEBRIDEMENT AND REMOVAL CYST  03-26-2004  DR SISSY   MYOMECTOMY     RADIOACTIVE SEED GUIDED PARTIAL MASTECTOMY WITH AXILLARY SENTINEL LYMPH NODE BIOPSY Right 11/16/2018   Procedure: RIGHT BREAST RADIOACTIVE SEED GUIDED PARTIAL MASTECTOMY WITH  SENTINEL  NODE BIOPSY;  Surgeon: Vernetta Berg, MD;  Location: [facility] OR;  Service: General;  Laterality: Right;   RIGHT COLECTOMY     RIGHT URETEROSCOPIC STONE EXTRACTION  12-11-2009  DR NORLEEN [facility]   TENDON RELEASE     TONSILLECTOMY     TOTAL HIP ARTHROPLASTY Right 11/26/2016   Procedure: RIGHT TOTAL HIP ARTHROPLASTY ANTERIOR APPROACH;  Surgeon: Lonni CINDERELLA Vernetta, MD;  Location: [facility] ORS;  Service: Orthopedics;  Laterality: Right;   TOTAL HIP ARTHROPLASTY Left 12/06/2023   Procedure: LEFT TOTAL HIP ARTHROPLASTY ANTERIOR APPROACH;  Surgeon: Vernetta Lonni CINDERELLA, MD;  Location: [facility] OR;  Service: Orthopedics;  Laterality: Left;   TOTAL KNEE ARTHROPLASTY Left 12/02/2017   Procedure: LEFT TOTAL KNEE ARTHROPLASTY;  Surgeon: Vernetta Lonni CINDERELLA, MD;  Location: [facility] OR[facility];  Service: Orthopedics;  Laterality: Left;   TOTAL KNEE ARTHROPLASTY Right 08/03/2022   Procedure: RIGHT TOTAL KNEE  ARTHROPLASTY;  Surgeon: Vernetta Lonni CINDERELLA, MD;  Location: [facility] OR;  Service: Orthopedics;  Laterality: Right;   WRIST SURGERY     both    FAMILY HISTORY: Family History  Problem Relation Age of Onset   Hyperlipidemia Mother    Diabetes Mother    Hypertension Mother    Parkinson's disease Mother    Kidney disease Mother    Kidney disease Father    Stroke Father    Heart failure Father    Hypertension Father    Heart disease Father    Diabetes Father    Cancer Father        colon, prostate, and kidney   Restless legs syndrome Father    Cancer Maternal Aunt        cervical vs ovarian   Colon cancer Paternal Aunt        dx less than 50   Breast cancer Paternal Aunt        dx in her 85s   Prostate cancer Paternal Uncle    Heart disease Maternal Grandmother    Diabetes Maternal  Grandmother    Heart disease Maternal Grandfather    Diabetes Maternal Grandfather    Cancer Maternal Grandfather        pt unaware of what kind   Stroke Paternal Grandmother    Stroke Paternal Grandfather    Breast cancer Cousin        pat first cousin, dx over 56   Prostate cancer Other        PGF's father  Cindy's father died from colon cancer at age 58; he was diagnosed with prostate cancer at 70, and he also had kidney cancer. Patients' mother died from failure to thrive at age 8. The patient has no siblings. Lauren [name] had a paternal aunt that was diagnosed with breast cancer in her mid 85's. Lauren [name] also has a paternal great grandfather that was diagnosed with prostate cancer. Patient denies anyone in her family having ovarian cancer.    GYNECOLOGIC HISTORY:  No LMP recorded. Patient is postmenopausal. 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 Her husband, Larnell, ran a Camera operator. Together, they have two children, Richard and [employer]. Their son, Charlie, lives in [place] and is disabled (MS). Their daughter, Augustin, is a Scientist, forensic (and also has MS). Lauren [name] has 5 grandchildren, no great grandchildren. She attends a [employer].    ADVANCED DIRECTIVES: In the absence of any documentation to the contrary, the patient's spouse is their HCPOA.    HEALTH MAINTENANCE: Social History   Tobacco Use   Smoking status: Former    Current packs/day: 0.00    Average packs/day: 1 pack/day for 10.0 years (10.0 ttl pk-yrs)    Types: Cigarettes    Start date: 09/22/1972    Quit date: 09/22/1982    Years since quitting: 41.7   Smokeless tobacco: Former    Quit date: 1983   Tobacco comments:    quit 1983  Vaping Use   Vaping status: Never Used  Substance Use Topics   Alcohol use: No   Drug use: No    Colonoscopy: yes, 3 or 4 years ago  PAP: Stopped at 68; Inocente Salt, NP - [facility]  Bone density: 12/2019, -1.7  Allergies  Allergen Reactions   Ciprofloxacin Hcl Hives   Percocet [Oxycodone -Acetaminophen ] Other (See Comments)    RESPIRATORY DISTRESS   Penicillins Hives and Other (See Comments)    Has patient had a PCN reaction causing immediate rash, facial/tongue/throat swelling, SOB or lightheadedness with hypotension: no Has patient had a PCN reaction causing severe rash involving mucus membranes or skin necrosis: no Has patient had a PCN reaction that required hospitalization no Has patient had a PCN reaction occurring within the last 10 years: no If all of the above answers are NO, then may proceed with Cephalosporin use.   Sulfa Antibiotics Hives   Tape Other (See Comments)    Adhesives - Causes Blisters. No band aids, and plastic tape    Tetanus Toxoids Hives and Rash   Jardiance [Empagliflozin]     Severe UTI    Current Outpatient Medications  Medication Sig Dispense Refill   aspirin  EC 81 MG  tablet Take 81 mg by mouth daily. Swallow whole.     B Complex-C (B-COMPLEX WITH VITAMIN C) tablet Take 1 tablet by mouth daily.  escitalopram  (LEXAPRO ) 20 MG tablet Take 1 tablet (20 mg total) by mouth daily. 90 tablet 0   FIBER GUMMIES PO Take 1-2 each by mouth See admin instructions. Take 2 gummies with breakfast and 1 gummy with dinner     fluticasone  (FLONASE ) 50 MCG/ACT nasal spray Place 2 sprays into both nostrils daily as needed for allergies or rhinitis. 9.9 mL 0   gabapentin  (NEURONTIN ) 300 MG capsule Take 600 mg by mouth at bedtime.   1   HYDROcodone -acetaminophen  (NORCO) 10-325 MG tablet Take 1 tablet by mouth every 6 (six) hours as needed for moderate pain (pain score 4-6).     Lancets (ONETOUCH DELICA PLUS LANCET33G) MISC      Magnesium  250 MG TABS Take 250 mg by mouth daily.     methocarbamol  (ROBAXIN ) 500 MG tablet Take 1 tablet (500 mg total) by mouth every 6 (six) hours as needed for muscle spasms. (Patient taking differently: Take 500 mg by mouth at bedtime.) 60 tablet 0   mirabegron  ER (MYRBETRIQ ) 50 MG TB24 tablet Take 1 tablet (50 mg total) by mouth daily. 90 tablet 3   ONETOUCH VERIO test strip      pantoprazole  (PROTONIX ) 40 MG tablet Take 40 mg by mouth 2 (two) times daily.     phenylephrine  (NEO-SYNEPHRINE) 0.25 % nasal spray Place 1 spray into both nostrils at bedtime as needed for congestion.     polyethylene glycol powder (GLYCOLAX /MIRALAX ) 17 GM/SCOOP powder Take 1/2 dose daily. (Patient not taking: Reported on 06/06/2024) 3350 g 1   rosuvastatin  (CRESTOR ) 20 MG tablet Take 1 tablet (20 mg total) by mouth daily. 30 tablet 0   tirzepatide  (MOUNJARO ) 7.5 MG/0.5ML Pen Inject 7.5 mg into the skin once a week. 2 mL 0   No current facility-administered medications for this visit.    OBJECTIVE:  white woman who appears stated age  There were no vitals filed for this visit.      There is no height or weight on file to calculate BMI.   Wt Readings from Last 3  Encounters:  06/06/24 178 lb (80.7 kg)  05/31/24 182 lb 12.8 oz (82.9 kg)  05/24/24 183 lb 12.8 oz (83.4 kg)      ECOG FS:2 - Symptomatic, <50% confined to bed  PE deferred, telephone visit.    LAB RESULTS:  CMP     Component Value Date/Time   NA 140 05/24/2024 1032   K 3.5 05/24/2024 1032   CL 101 05/24/2024 1032   CO2 21 05/24/2024 1032   GLUCOSE 68 (L) 05/24/2024 1032   GLUCOSE 118 (H) 12/07/2023 0736   BUN 7 (L) 05/24/2024 1032   CREATININE 0.81 05/24/2024 1032   CREATININE 0.87 06/08/2022 1439   CALCIUM  9.3 05/24/2024 1032   PROT 6.6 05/24/2024 1032   ALBUMIN  4.3 05/24/2024 1032   AST 17 05/24/2024 1032   AST 16 06/08/2022 1439   ALT 12 05/24/2024 1032   ALT 12 06/08/2022 1439   ALKPHOS 72 05/24/2024 1032   BILITOT 0.4 05/24/2024 1032   BILITOT 0.4 06/08/2022 1439   GFRNONAA 60 (L) 12/07/2023 0736   GFRNONAA >60 06/08/2022 1439   GFRAA >60 02/04/2020 1212   GFRAA >60 11/09/2018 1405    Lab Results  Component Value Date   ALBUMINELP 3.9 05/24/2024   MSPIKE Not Observed 05/24/2024    No results found for: KPAFRELGTCHN, LAMBDASER, KAPLAMBRATIO  Lab Results  Component Value Date   WBC 10.9 (H) 11/30/2023   NEUTROABS 4.0 06/08/2022  HGB 12.4 11/30/2023   HCT 37.5 11/30/2023   MCV 88.2 11/30/2023   PLT 327 11/30/2023   No results found for: LABCA2  No components found for: [id number]  No results for input(s): INR in the last 168 hours.  No results found for: LABCA2  No results found for: RJW800  No results found for: CAN125  No results found for: CAN153  No results found for: CA2729  No components found for: HGQUANT  No results found for: CEA1, CEA / No results found for: CEA1, CEA   No results found for: AFPTUMOR  No results found for: CHROMOGRNA  No results found for: HGBA, HGBA2QUANT, HGBFQUANT, HGBSQUAN (Hemoglobinopathy evaluation)   No results found for: LDH  Lab Results   Component Value Date   IRON 70 04/11/2024   TIBC 340 04/11/2024   IRONPCTSAT 21 04/11/2024   (Iron and TIBC)  Lab Results  Component Value Date   FERRITIN 26 04/11/2024    Urinalysis    Component Value Date/Time   COLORURINE YELLOW 04/04/2024 1128   APPEARANCEUR CLEAR 04/04/2024 1128   LABSPEC 1.031 (H) 04/04/2024 1128   PHURINE 6.0 04/04/2024 1128   GLUCOSEU NEGATIVE 04/04/2024 1128   HGBUR NEGATIVE 04/04/2024 1128   BILIRUBINUR NEGATIVE 04/04/2024 1128   BILIRUBINUR Negative 02/22/2024 1051   KETONESUR TRACE (A) 04/04/2024 1128   PROTEINUR 30 (A) 04/04/2024 1128   UROBILINOGEN 0.2 02/22/2024 1051   UROBILINOGEN 0.2 11/28/2013 1456   NITRITE NEGATIVE 04/04/2024 1128   LEUKOCYTESUR TRACE (A) 04/04/2024 1128    STUDIES:  MM DIAG BREAST TOMO BILATERAL Result Date: 06/04/2024 CLINICAL DATA:  78 year old female presenting for evaluation of a new lump in the far outer right axilla. Patient has a history of right breast cancer status post lumpectomy in 2020. Patient reports some recent weight loss. EXAM: DIGITAL DIAGNOSTIC BILATERAL MAMMOGRAM WITH TOMOSYNTHESIS AND CAD; US  AXILLARY RIGHT TECHNIQUE: Bilateral digital diagnostic mammography and breast tomosynthesis was performed. The images were evaluated with computer-aided detection. ; Targeted ultrasound examination of the right axilla was performed. COMPARISON:  Previous exam(s). ACR Breast Density Category b: There are scattered areas of fibroglandular density. FINDINGS: Mammogram: Right breast: No suspicious mass, distortion, or microcalcifications are identified to suggest presence of malignancy. The palpable site is not visualized mammographically due to far superior and posterior location. No new abnormality in visualized portion of the right axilla. Left breast: No suspicious mass, distortion, or microcalcifications are identified to suggest presence of malignancy. On physical exam at the site of concern reported by the patient in  the far outer right axilla, almost the back, I do not feel a fixed discrete mass or focal thickening. Ultrasound: Targeted ultrasound performed at the palpable site of concern in the right axilla demonstrating no cystic or solid mass. No abnormal lymph node visualized. IMPRESSION: 1. No mammographic or sonographic evidence of malignancy or other imaging abnormality at the palpable site of concern in the far outer right axilla. 2. No mammographic evidence of malignancy in the bilateral breasts. RECOMMENDATION: 1. Recommend any further workup of the palpable site in the right axilla be on a clinical basis given negative imaging findings. 2.  Screening mammogram in one year.(Code:SM-B-01Y) I have discussed the findings and recommendations with the patient. If applicable, a reminder letter will be sent to the patient regarding the next appointment. BI-RADS CATEGORY  1: Negative. Electronically Signed   By: Inocente Ast M.D.   On: 06/04/2024 10:52   US  AXILLA RIGHT Result Date: 06/04/2024 CLINICAL DATA:  78 year old female presenting for evaluation of a new lump in the far outer right axilla. Patient has a history of right breast cancer status post lumpectomy in 2020. Patient reports some recent weight loss. EXAM: DIGITAL DIAGNOSTIC BILATERAL MAMMOGRAM WITH TOMOSYNTHESIS AND CAD; US  AXILLARY RIGHT TECHNIQUE: Bilateral digital diagnostic mammography and breast tomosynthesis was performed. The images were evaluated with computer-aided detection. ; Targeted ultrasound examination of the right axilla was performed. COMPARISON:  Previous exam(s). ACR Breast Density Category b: There are scattered areas of fibroglandular density. FINDINGS: Mammogram: Right breast: No suspicious mass, distortion, or microcalcifications are identified to suggest presence of malignancy. The palpable site is not visualized mammographically due to far superior and posterior location. No new abnormality in visualized portion of the right  axilla. Left breast: No suspicious mass, distortion, or microcalcifications are identified to suggest presence of malignancy. On physical exam at the site of concern reported by the patient in the far outer right axilla, almost the back, I do not feel a fixed discrete mass or focal thickening. Ultrasound: Targeted ultrasound performed at the palpable site of concern in the right axilla demonstrating no cystic or solid mass. No abnormal lymph node visualized. IMPRESSION: 1. No mammographic or sonographic evidence of malignancy or other imaging abnormality at the palpable site of concern in the far outer right axilla. 2. No mammographic evidence of malignancy in the bilateral breasts. RECOMMENDATION: 1. Recommend any further workup of the palpable site in the right axilla be on a clinical basis given negative imaging findings. 2.  Screening mammogram in one year.(Code:SM-B-01Y) I have discussed the findings and recommendations with the patient. If applicable, a reminder letter will be sent to the patient regarding the next appointment. BI-RADS CATEGORY  1: Negative. Electronically Signed   By: Inocente Ast M.D.   On: 06/04/2024 10:52     ELIGIBLE FOR AVAILABLE RESEARCH PROTOCOL: no   ASSESSMENT: 78 y.o. [place], [place] woman status post right breast upper outer quadrant biopsy 10/18/2018 for a clinical T1b N0, stage IA invasive ductal carcinoma, grade 2, estrogen and progesterone receptor positive, MIB-1 5%, with no HER-2 amplification by FISH  (1) right lumpectomy and sentinel lymph node sampling 11/16/2018 showed a pT1c pN0, stage IA invasive ductal carcinoma, grade 2, with negative margins  (a) a single sentinel lymph node was removed  (2) Oncotype score of 12 predicted a risk of recurrence outside the breast over the next 9 years of only 3% if the patient's only systemic treatment was antiestrogens for 5 years.  No significant benefit was anticipated from chemotherapy.  (a) Oncotype also read  tumor as HER-2 negative  (3) adjuvant radiation 01/02/2019 to 01/29/2019. Site/dose:   The patient initially received a dose of 42.56 Gy in 16 fractions to the right breast using whole-breast tangent fields. This was delivered using a 3-D conformal technique. The patient then received a boost. This delivered an additional 8 Gy in 4 fractions using a 3 field photon technique. The total dose was 50.56 Gy.  (4) anastrozole  started 02/13/2019  (a) bone density 01/06/2012 was normal  (b) anastrozole  discontinued April 2021, with arthralgias/myalgias  (5) genetics testing 11/27/2018 through the Multi-Gene Panel offered by [facility] found no deleterious mutations in AIP, ALK, APC, ATM, AXIN2,BAP1,  BARD1, BLM, BMPR1A, BRCA1, BRCA2, BRIP1, CASR, CDC73, CDH1, CDK4, CDKN1B, CDKN1C, CDKN2A (p14ARF), CDKN2A (p16INK4a), CEBPA, CHEK2, CTNNA1, DICER1, DIS3L2, EGFR (c.2369C>T, p.Thr790Met variant only), EPCAM (Deletion/duplication testing only), FH, FLCN, GATA2, GPC3, GREM1 (Promoter region deletion/duplication testing only), HOXB13 (c.251G>A, p.Gly84Glu), HRAS,  KIT, MAX, MEN1, MET, MITF (c.952G>A, p.Glu318Lys variant only), MLH1, MSH2, MSH3, MSH6, MUTYH, NBN, NF1, NF2, NTHL1, PALB2, PDGFRA, PHOX2B, PMS2, POLD1, POLE, POT1, PRKAR1A, PTCH1, PTEN, RAD50, RAD51C, RAD51D, RB1, RECQL4, RET, RUNX1, SDHAF2, SDHA (sequence changes only), SDHB, SDHC, SDHD, SMAD4, SMARCA4, SMARCB1, SMARCE1, STK11, SUFU, TERC, TERT, TMEM127, TP53, TSC1, TSC2, VHL, WRN and WT1.    (a) a variant of uncertain significant was found in DICER1, c.2118C>T (silent)  (6) exemestane  prescribed 04/03/2020--could not obtain secondary to cost  (a) started letrozole  Jun 2021, discontinued because of intolerable side effects  (b) opted against tamoxifen (see discussion 08/05/2020 visit)   PLAN:  This is a follow up telephone visit to review her recent MM and US  results. MM and US  didn't show any concern for malignancy. I recommended that she continue to  monitor this area and report any changes to us . She is agreeable to this. Will send a message to change follow up from Sep to Jan/Feb  Time spent: 10 min  I connected with  Montie GORMAN Sierras on 06/12/24 by a telephone application and verified that I am speaking with the correct person using two identifiers.   I discussed the limitations of evaluation and management by telemedicine. The patient expressed understanding and agreed to proceed.  Location of pt: Home Location of provider: office.  *Total Encounter Time as defined by the Centers for Medicare and Medicaid Services includes, in addition to the face-to-face time of a patient visit (documented in the note above) non-face-to-face time: obtaining and reviewing outside history, ordering and reviewing medications, tests or procedures, care coordination (communications with other health care professionals or caregivers) and documentation in the medical record.

## 2024-06-13 ENCOUNTER — Ambulatory Visit

## 2024-06-13 DIAGNOSIS — R293 Abnormal posture: Secondary | ICD-10-CM

## 2024-06-13 DIAGNOSIS — M62838 Other muscle spasm: Secondary | ICD-10-CM | POA: Diagnosis not present

## 2024-06-13 DIAGNOSIS — M25552 Pain in left hip: Secondary | ICD-10-CM

## 2024-06-13 DIAGNOSIS — M5459 Other low back pain: Secondary | ICD-10-CM

## 2024-06-13 DIAGNOSIS — M6281 Muscle weakness (generalized): Secondary | ICD-10-CM

## 2024-06-13 DIAGNOSIS — M25551 Pain in right hip: Secondary | ICD-10-CM

## 2024-06-13 DIAGNOSIS — R279 Unspecified lack of coordination: Secondary | ICD-10-CM

## 2024-06-13 NOTE — Therapy (Signed)
 OUTPATIENT PHYSICAL THERAPY FEMALE PELVIC TREATMENT   Patient Name: Lauren [name] MRN: [id number] DOB:01/03/1946, 78 y.o., female Today's Date: 06/13/2024  END OF SESSION:  PT End of Session - 06/13/24 1226     Visit Number 4    Date for PT Re-Evaluation 07/16/24    Authorization Type [employer] $10 copay    Progress Note Due on Visit 10    PT Start Time 1226    PT Stop Time 1310    PT Time Calculation (min) 44 min    Activity Tolerance Patient tolerated treatment well    Behavior During Therapy WFL for tasks assessed/performed             Past Medical History:  Diagnosis Date   Anxiety    Arthritis    Back, Hip- right   Atypical chest pain 06/25/2021   Back pain    Cancer (HCC)    right breast   Cataract    Colitis    Coronary artery calcification 02/20/2020   Diabetes mellitus without complication (HCC)    Type II   Edema 03/05/2021   Edema of both lower extremities    Endometriosis    Family history of adverse reaction to anesthesia    Father - N/V - blood pressure; daughter N/V   Family history of breast cancer    Family history of colon cancer    Family history of kidney cancer    Family history of prostate cancer    Fatty liver    GERD (gastroesophageal reflux disease)    History of hiatal hernia    History of kidney stones    History of ulcerative colitis    Hypertension    Insomnia    Joint pain    Kidney problem    Lower extremity edema 03/05/2021   Lumbar disc disease    L4 L5   Neuropathy    Osteoarthritis    Other hyperlipidemia    Palpitations    Personal history of radiation therapy    Pneumonia    1983, 2003   Pure hypercholesterolemia 02/20/2020   PVCs (premature ventricular contractions)    benign   Umbilical hernia    Vitamin D  deficiency    Wears glasses    Past Surgical History:  Procedure Laterality Date   APPENDECTOMY     BREAST BIOPSY  08-13-2009  DR TSUEI   EXCISION LEFT NIPPLE DUCT   BREAST EXCISIONAL  BIOPSY Left    x2   BREAST EXCISIONAL BIOPSY Right    BREAST LUMPECTOMY Right 11/16/2018   invasive ductal   CHOLECYSTECTOMY  1992   COLON RESECTION  1986   ULCERATIVE COLITIS   COLON SURGERY     ESOPHAGEAL MANOMETRY N/A 06/11/2013   Procedure: ESOPHAGEAL MANOMETRY (EM);  Surgeon: Oliva FORBES Boots, MD;  Location: [facility];  Service: Endoscopy;  Laterality: N/A;   ESOPHAGOGASTRODUODENOSCOPY (EGD) WITH PROPOFOL  N/A 05/31/2016   Procedure: ESOPHAGOGASTRODUODENOSCOPY (EGD) WITH PROPOFOL ;  Surgeon: Oliva Boots, MD;  Location: [facility] ENDOSCOPY;  Service: Endoscopy;  Laterality: N/A;   EXCISION RIGHT BREAST MASS   10-20-2005  DR MAUDE YOUNG   EYE SURGERY Bilateral 2023   cataract   FRACTURE SURGERY     left arm   kidney stone removed  12/2009   KNEE ARTHROSCOPY Left    MCL   LEFT THUMB CARPOMETACARPAL JOINT SUSPENSIONPLASTY  04-06-2006  DR SISSY   LEFT WRIST ARTHROSCOPY W/ DEBRIDEMENT AND REMOVAL CYST  03-26-2004  DR SISSY   MYOMECTOMY  RADIOACTIVE SEED GUIDED PARTIAL MASTECTOMY WITH AXILLARY SENTINEL LYMPH NODE BIOPSY Right 11/16/2018   Procedure: RIGHT BREAST RADIOACTIVE SEED GUIDED PARTIAL MASTECTOMY WITH  SENTINEL  NODE BIOPSY;  Surgeon: Vernetta Berg, MD;  Location: [facility] OR;  Service: General;  Laterality: Right;   RIGHT COLECTOMY     RIGHT URETEROSCOPIC STONE EXTRACTION  12-11-2009  DR [facility]LE[facility]   TENDON RELEASE     TONSILLECTOMY     TOTAL HIP ARTHROPLASTY Right 11/26/2016   Procedure: RIGHT TOTAL HIP ARTHROPLASTY ANTERIOR APPROACH;  Surgeon: Lonni CINDERELLA Vernetta, MD;  Location: [facility] OR[facility];  Service: Orthopedics;  Laterality: Right;   TOTAL HIP ARTHROPLASTY Left 12/06/2023   Procedure: LEFT TOTAL HIP ARTHROPLASTY ANTERIOR APPROACH;  Surgeon: Vernetta Lonni CINDERELLA, MD;  Location: [facility] OR;  Service: Orthopedics;  Laterality: Left;   TOTAL KNEE ARTHROPLASTY Left 12/02/2017   Procedure: LEFT TOTAL KNEE ARTHROPLASTY;  Surgeon: Vernetta Lonni CINDERELLA, MD;  Location: [facility];  Service:  Orthopedics;  Laterality: Left;   TOTAL KNEE ARTHROPLASTY Right 08/03/2022   Procedure: RIGHT TOTAL KNEE ARTHROPLASTY;  Surgeon: Vernetta Lonni CINDERELLA, MD;  Location: [facility] OR;  Service: Orthopedics;  Laterality: Right;   WRIST SURGERY     both   Patient Active Problem List   Diagnosis Date Noted   History of iron deficiency 05/24/2024   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   Falls 05/01/2024   Iron deficiency 05/01/2024   Overactive bladder 02/22/2024   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/2023   SUI (stress urinary incontinence, female) 11/22/2023   Restless leg syndrome 09/22/2023   Other hyperlipidemia 06/08/2023   Unilateral primary osteoarthritis, left hip 05/11/2023   Depression 03/16/2023   Allergic rhinitis 12/29/2022   Seasonal affective disorder (HCC) 12/01/2022   BMI 32.0-32.9,adult 12/01/2022   Obesity, Beginning BMI 37.59 12/01/2022   OA (osteoarthritis) of knee 08/03/2022   Status post total right knee replacement 08/03/2022   Stress 07/19/2022   Other constipation 07/19/2022   Atypical chest pain 06/25/2021   Class 2 severe obesity with serious comorbidity and body mass index (BMI) of 37.0 to 37.9 in adult (HCC) 06/03/2021   Vitamin D  deficiency 06/03/2021   Other fatigue 04/22/2021   SOB (shortness of breath) on exertion 04/22/2021   Lower extremity edema 03/05/2021   Coronary artery calcification 02/20/2020   Pure hypercholesterolemia 02/20/2020   Unilateral primary osteoarthritis, right knee 01/03/2019   Genetic testing 12/07/2018   Family history of breast cancer    Family history of prostate cancer    Family history of colon cancer    Family history of kidney cancer    Malignant neoplasm of upper-outer quadrant of right breast in female, 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/09/2018   Diabetic neuropathy, type II diabetes mellitus (HCC) 11/09/2018   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/02/2017   Trochanteric bursitis, right hip  05/10/2017   Status post total replacement of right hip 11/26/2016   DDD (degenerative disc disease), 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   Low back pain 04/23/2015   Hiatal hernia 04/13/2013   Diabetes mellitus (HCC) 01/26/2013   Umbilical hernia 07/24/2012   Essential hypertension     PCP: Okey Carlin Redbird, MD  REFERRING PROVIDER: Marilynne Rosaline SAILOR, MD   REFERRING DIAG: N32.81 (ICD-10-CM) - Overactive bladder N39.3 (ICD-10-CM) - SUI (stress urinary incontinence, female)  THERAPY DIAG:  Other muscle spasm  Muscle weakness (generalized)  Abnormal posture  Unspecified lack of coordination  Pain in left hip  Pain in right hip  Other low back pain  Rationale for Evaluation and Treatment: Rehabilitation   ONSET DATE: 1 year  SUBJECTIVE:                                                                                                                                                                                           SUBJECTIVE STATEMENT: Pt states that she is feeling much better after last treatment session. Her back pain is largely reduced and her Rt hip is feeling better. Pt states that she is still having increased urinary urgency, maybe just a little bit better from the weekend.    PAIN: 06/13/24 Are you having pain? Yes NPRS scale: 5/10 Pain location: low back pain; Lt hip soreness; Rt iliopsoas issue   Pain type: aching Pain description: intermittent   Aggravating factors: lifting Rt LE Relieving factors: pain medication 3x/day (would love to get off of)  PRECAUTIONS: None  RED FLAGS: None   WEIGHT BEARING RESTRICTIONS: No  FALLS:  Has patient fallen in last 6 months? Yes. Number of falls 1 fall, 3 weeks ago  OCCUPATION: Geophysical data processor   ACTIVITY LEVEL : not currently exercising   PLOF: Independent  PATIENT GOALS: to get better control over bladder  PERTINENT HISTORY:  Appendectomy, Rt breast lumpectomy, colon surgery, cholecystectomy, Lt  knee arthroscopy to repair MCL, myomectomy, Rt partial mastectomy 2020, Rt colectomy, Rt THA 2018, Lt THA 2025, Lt TKA 2019, Rt TKA 2023, umbilical hernia  BOWEL MOVEMENT: Pain with bowel movement: No Type of bowel movement:Frequency 1-2x/day and Strain yes Fully empty rectum: Yes: mostly Leakage: No Pads: Yes: see below  Fiber supplement/laxative Yes - fiber gummies   URINATION: Pain with urination: No Fully empty bladder: No - not always, some post-void dribbling or she walks around with feeling that there is still a drop or two still there (almost feels like she needs to have an orgasm) Stream: WNL Urgency: has mostly gone away Frequency: usually no trips at night anymore; every 2 hours during the day  Fluid Intake:  Leakage: Coughing, Sneezing, Laughing, Lifting, and Bending forward Pads: Yes: usually, but not more than a spot or two  INTERCOURSE:  Not sexually active anymore - widow as of 3 years ago  PREGNANCY: Vaginal deliveries 2 Tearing No Episiotomy No C-section deliveries 0 Currently pregnant No  PROLAPSE: None   OBJECTIVE:  Note: Objective measures were completed at Evaluation unless otherwise noted.   PATIENT SURVEYS:   PFIQ-7: 48  COGNITION: Overall cognitive status: Within functional limits for tasks assessed     SENSATION: Light touch: Appears intact   FUNCTIONAL TESTS:  Squat: bil UE support Single leg stance:  Rt:  pelvic drop with UE support  Lt: pelvic drop with UE support Curl-up test: distortion   GAIT: Assistive device utilized: None Comments: bil trendelenburg   POSTURE: rounded shoulders, forward head, decreased lumbar lordosis, increased thoracic kyphosis, posterior pelvic tilt, and Rt posterior rotation   LUMBARAROM/PROM:  A/PROM A/PROM  Eval (% available)  Flexion 50  Extension 50  Right lateral flexion 50, pain  Left lateral flexion 50  Right rotation 50  Left rotation 50   (Blank rows = not tested)  PALPATION:    General: tightness throughout bil lumbar paraspinals; bil scar tissue restriction in hip incisions  Pelvic Alignment: Rt posterior rotation   Abdominal: apical breathing pattern, decreased rib cage mobility                 External Perineal Exam: red and dry                             Internal Pelvic Floor: no superficial discomfort, mild discomfort in Lt deep layers, significant tightness and pain in deep Rt pelvic floor/obturator/piriformis  Patient confirms identification and approves PT to assess internal pelvic floor and treatment Yes  PELVIC MMT:   MMT eval  Vaginal 3/5, 2 second endurance, 5 repeat contraction  Diastasis Recti 2-3 finger width separation   (Blank rows = not tested)        TONE: High, Rt>Lt  PROLAPSE: Grade 1 anterior vaginal wall laxity  TODAY'S TREATMENT:                                                                                                                              DATE:  06/13/24 Neuromuscular re-education: Seated hip adduction ball press with transversus abdominus and pelvic floor muscle 2 x 10 Seated hip abduction red band with transversus abdominus and pelvic floor muscle 2 x 10 Seated march with transversus abdominus and pelvic floor muscle 2 x 10 Seated horizontal abduction + pelvic floor muscle contraction green band 2 x 10 Seated resisted rotation + pelvic floor muscle contraction green band 2 x 10 Therapeutic activities: Voiding schedule - every 2 hours whether she needs to or not Consistent and steady intake of water   Using urge drill to help make it to her voiding window  Standing pallof press red band 2 x 10 bil Standing bil shoulder extension red band 2 x 10 bil Squats to table + pelvic floor muscle contraction 10x   06/11/24 Manual: In Lt sidelying with pillow under Lt hip Soft tissue mobilization to Rt flank, quadratus lumborum, lumbar paraspinals, glutes Scar tissue mobilization to Rt hip incision and surrounding  area Negative pressure soft tissue mobilization to Rt hip Neuromuscular re-education: Transversus abdominus training with multimodal cues for improved motor control and breath coordination Bil supine UE ball press with transversus abdominus and pelvic floor muscle contractions and breath coordination 10x Supine hip adduction ball press with transversus abdominus and  pelvic floor muscle contractions and breath coordination 10x Bridge with hip adduction, transversus abdominus, and pelvic floor muscle 2 x 10 Supine hip abduction + transversus abdominus  + pelvic floor muscle red band 2 x 10 Supine resisted march + transversus abdominus + pelvic floor muscle red band 2 x 10   06/06/24 Trigger Point Dry Needling  Initial Treatment: Pt instructed on Dry Needling rational, procedures, and possible side effects. Pt instructed to expect mild to moderate muscle soreness later in the day and/or into the next day.  Pt instructed in methods to reduce muscle soreness. Pt instructed to continue prescribed HEP. Patient was educated on signs and symptoms of infection and other risk factors and advised to seek medical attention should they occur.  Patient verbalized understanding of these instructions and education.   Patient Verbal Consent Given: Yes Education Handout Provided: Yes Muscles Treated: Bil glutes, tensor fascia latte, piriformis Electrical Stimulation Performed: No Treatment Response/Outcome: palpable twitch response   Manual: Soft tissue mobilization to bil posterolateral hips  Rt hip scar tissue mobilization Pt provides verbal consent for internal vaginal/rectal pelvic floor exam. Bil internal vaginal levator ani release  Neuromuscular re-education: Internal vaginal pelvic floor muscle contraction training Quick flicks Urge drill The knack    PATIENT EDUCATION:  Education details: See above Person educated: Patient Education method: Explanation, Demonstration, Tactile cues,  Verbal cues, and Handouts Education comprehension: verbalized understanding  HOME EXERCISE PROGRAM: [id number]  ASSESSMENT:  CLINICAL IMPRESSION: Patient is a 78 y.o. female who was seen today for physical therapy evaluation and treatment for stress urinary incontinence, constipation, and low back pain. Pt doing much better with low back pain after last session. She has had mild improvement in urinary urgency. She discussed implementing voiding schedule while using urge drill to help her. She did very well with all exercise progressions, but still cannot use resistance in marching. She did well working into more gravity dependent positions, but did refer back to bearing down initially in pallof press and noticed her umbilical hernia and some low back pain; this improved with correcting posture and purposeful breathing/core activation. HEP updated. She will continue to benefit from skilled PT intervention in order to decrease stress urinary incontinence, improve constipation, decrease low back/hip pain, address impairments, and improve quality of life.   OBJECTIVE IMPAIRMENTS: decreased activity tolerance, decreased coordination, decreased endurance, decreased mobility, decreased ROM, decreased strength, increased fascial restrictions, increased muscle spasms, impaired flexibility, impaired tone, improper body mechanics, postural dysfunction, and pain.   ACTIVITY LIMITATIONS: lifting, bending, and continence  PARTICIPATION LIMITATIONS: cleaning, laundry, and community activity  PERSONAL FACTORS: 1-2 comorbidities: medical history are also affecting patient's functional outcome.   REHAB POTENTIAL: Good  CLINICAL DECISION MAKING: Evolving/moderate complexity  EVALUATION COMPLEXITY: Moderate   GOALS: Goals reviewed with patient? Yes  SHORT TERM GOALS: Updated 06/06/24   Pt will be independent with HEP.   Baseline: Goal status: IN PROGRESS 06/06/24  2.  Pt will decrease pain by 25% in order  to be able to perform regular exercise program to decrease urinary incontinence and pain.  Baseline:  Goal status: IN PROGRESS 06/06/24  3.  Pt will have no tenderness in bil levator ani in order to be able to ambulate and move low back/pelvis in daily activities without pain.  Baseline:  Goal status: IN PROGRESS 06/06/24  4.  Pt will increase pelvic floor muscle strength to 4/5 in order to decrease urinary incontinence and pad use.  Baseline:  Goal status: IN PROGRESS 06/06/24  5.  Pt will be independent with the knack, urge suppression technique, and double voiding in order to improve bladder habits and decrease urinary incontinence.   Baseline:  Goal status: IN PROGRESS 06/06/24  6.  Pt will be independent with use of squatty potty, relaxed toileting mechanics, and improved bowel movement techniques in order to increase ease of bowel movements and complete evacuation.   Baseline:  Goal status: IN PROGRESS 06/06/24  LONG TERM GOALS: Updated 06/06/24  Pt will be independent with advanced HEP.   Baseline:  Goal status: IN PROGRESS 06/06/24  2.  Pt will decrease pain by 75% in order to be able to perform regular exercise program to decrease urinary incontinence and pain.  Baseline:  Goal status: IN PROGRESS 06/06/24  3.  Pt will increase pelvic floor muscle endurance to greater than 10 seconds in order to decrease pad use to 0 a day.  Baseline:  Goal status: IN PROGRESS 06/06/24  4.  Pt will report no leaks with laughing, coughing, sneezing, bending, and lifting in order to improve comfort with interpersonal relationships and community activities.   Baseline:  Goal status: IN PROGRESS 06/06/24  5.  Pt will be able to lift 10lb kettle bell 5 times without pain or urinary incontinence.  Baseline:  Goal status: IN PROGRESS 06/06/24  6.  Pt will be able to stand on single LE without pelvic drop in order to improve fall risk, decrease low back pain, and help maintain progress with urinary  incontinence.  Baseline:  Goal status: IN PROGRESS 06/06/24  PLAN:  PT FREQUENCY: 1-2x/week  PT DURATION: 12 weeks  PLANNED INTERVENTIONS: 97110-Therapeutic exercises, 97530- Therapeutic activity, 97112- Neuromuscular re-education, 97535- Self Care, [id number]- Manual therapy, Dry Needling, and Biofeedback  PLAN FOR NEXT SESSION: teach the knack and double voiding; internal pelvic floor muscle release to tolerance; teach negative pressure soft tissue mobilization with suction cup to scar tissue on Rt hip; manual techniques to scar tissue    Josette Mares, PT, DPT08/13/2512:27 PM

## 2024-06-18 ENCOUNTER — Ambulatory Visit

## 2024-06-18 DIAGNOSIS — R279 Unspecified lack of coordination: Secondary | ICD-10-CM

## 2024-06-18 DIAGNOSIS — M62838 Other muscle spasm: Secondary | ICD-10-CM

## 2024-06-18 DIAGNOSIS — R293 Abnormal posture: Secondary | ICD-10-CM

## 2024-06-18 DIAGNOSIS — M5459 Other low back pain: Secondary | ICD-10-CM

## 2024-06-18 DIAGNOSIS — M25551 Pain in right hip: Secondary | ICD-10-CM

## 2024-06-18 DIAGNOSIS — M6281 Muscle weakness (generalized): Secondary | ICD-10-CM

## 2024-06-18 DIAGNOSIS — M25552 Pain in left hip: Secondary | ICD-10-CM

## 2024-06-18 NOTE — Therapy (Signed)
 OUTPATIENT PHYSICAL THERAPY FEMALE PELVIC TREATMENT   Patient Name: Lauren [name] MRN: [id number] DOB:[birth year], 78 y.o., female Today's Date: 06/18/2024  END OF SESSION:  PT End of Session - 06/18/24 1235     Visit Number 5    Date for PT Re-Evaluation 07/16/24    Authorization Type [employer] $10 copay    Progress Note Due on Visit 10    PT Start Time 1230    PT Stop Time 1312    PT Time Calculation (min) 42 min    Activity Tolerance Patient tolerated treatment well    Behavior During Therapy [facility] for tasks assessed/performed             Past Medical History:  Diagnosis Date   Anxiety    Arthritis    Back, Hip- right   Atypical chest pain 06/25/2021   Back pain    Cancer (HCC)    right breast   Cataract    Colitis    Coronary artery calcification 02/20/2020   Diabetes mellitus without complication (HCC)    Type II   Edema 03/05/2021   Edema of both lower extremities    Endometriosis    Family history of adverse reaction to anesthesia    Father - N/V - blood pressure; daughter N/V   Family history of breast cancer    Family history of colon cancer    Family history of kidney cancer    Family history of prostate cancer    Fatty liver    GERD (gastroesophageal reflux disease)    History of hiatal hernia    History of kidney stones    History of ulcerative colitis    Hypertension    Insomnia    Joint pain    Kidney problem    Lower extremity edema 03/05/2021   Lumbar disc disease    L4 L5   Neuropathy    Osteoarthritis    Other hyperlipidemia    Palpitations    Personal history of radiation therapy    Pneumonia    1983, 2003   Pure hypercholesterolemia 02/20/2020   PVCs (premature ventricular contractions)    benign   Umbilical hernia    Vitamin D  deficiency    Wears glasses    Past Surgical History:  Procedure Laterality Date   APPENDECTOMY     BREAST BIOPSY  08-13-2009  DR TSUEI   EXCISION LEFT NIPPLE DUCT   BREAST EXCISIONAL  BIOPSY Left    x2   BREAST EXCISIONAL BIOPSY Right    BREAST LUMPECTOMY Right 11/16/2018   invasive ductal   CHOLECYSTECTOMY  1992   COLON RESECTION  1986   ULCERATIVE COLITIS   COLON SURGERY     ESOPHAGEAL MANOMETRY N/A 06/11/2013   Procedure: ESOPHAGEAL MANOMETRY (EM);  Surgeon: Oliva FORBES Boots, MD;  Location: [facility];  Service: Endoscopy;  Laterality: N/A;   ESOPHAGOGASTRODUODENOSCOPY (EGD) WITH PROPOFOL  N/A 05/31/2016   Procedure: ESOPHAGOGASTRODUODENOSCOPY (EGD) WITH PROPOFOL ;  Surgeon: Oliva Boots, MD;  Location: [facility];  Service: Endoscopy;  Laterality: N/A;   EXCISION RIGHT BREAST MASS   10-20-2005  DR MAUDE YOUNG   EYE SURGERY Bilateral 2023   cataract   FRACTURE SURGERY     left arm   kidney stone removed  12/2009   KNEE ARTHROSCOPY Left    MCL   LEFT THUMB CARPOMETACARPAL JOINT SUSPENSIONPLASTY  04-06-2006  DR SISSY   LEFT WRIST ARTHROSCOPY W/ DEBRIDEMENT AND REMOVAL CYST  03-26-2004  DR SISSY   MYOMECTOMY  RADIOACTIVE SEED GUIDED PARTIAL MASTECTOMY WITH AXILLARY SENTINEL LYMPH NODE BIOPSY Right 11/16/2018   Procedure: RIGHT BREAST RADIOACTIVE SEED GUIDED PARTIAL MASTECTOMY WITH  SENTINEL  NODE BIOPSY;  Surgeon: Vernetta Berg, MD;  Location: [facility] OR;  Service: General;  Laterality: Right;   RIGHT COLECTOMY     RIGHT URETEROSCOPIC STONE EXTRACTION  12-11-2009  DR NORLEEN [facility]   TENDON RELEASE     TONSILLECTOMY     TOTAL HIP ARTHROPLASTY Right 11/26/2016   Procedure: RIGHT TOTAL HIP ARTHROPLASTY ANTERIOR APPROACH;  Surgeon: Lonni CINDERELLA Vernetta, MD;  Location: [facility];  Service: Orthopedics;  Laterality: Right;   TOTAL HIP ARTHROPLASTY Left 12/06/2023   Procedure: LEFT TOTAL HIP ARTHROPLASTY ANTERIOR APPROACH;  Surgeon: Vernetta Lonni CINDERELLA, MD;  Location: [facility] OR;  Service: Orthopedics;  Laterality: Left;   TOTAL KNEE ARTHROPLASTY Left 12/02/2017   Procedure: LEFT TOTAL KNEE ARTHROPLASTY;  Surgeon: Vernetta Lonni CINDERELLA, MD;  Location: [facility];  Service:  Orthopedics;  Laterality: Left;   TOTAL KNEE ARTHROPLASTY Right 08/03/2022   Procedure: RIGHT TOTAL KNEE ARTHROPLASTY;  Surgeon: Vernetta Lonni CINDERELLA, MD;  Location: [facility] OR;  Service: Orthopedics;  Laterality: Right;   WRIST SURGERY     both   Patient Active Problem List   Diagnosis Date Noted   History of iron deficiency 05/24/2024   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   Falls 05/01/2024   Iron deficiency 05/01/2024   Overactive bladder 02/22/2024   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/2023   SUI (stress urinary incontinence, female) 11/22/2023   Restless leg syndrome 09/22/2023   Other hyperlipidemia 06/08/2023   Unilateral primary osteoarthritis, left hip 05/11/2023   Depression 03/16/2023   Allergic rhinitis 12/29/2022   Seasonal affective disorder (HCC) 12/01/2022   BMI 32.0-32.9,adult 12/01/2022   Obesity, Beginning BMI 37.59 12/01/2022   OA (osteoarthritis) of knee 08/03/2022   Status post total right knee replacement 08/03/2022   Stress 07/19/2022   Other constipation 07/19/2022   Atypical chest pain 06/25/2021   Class 2 severe obesity with serious comorbidity and body mass index (BMI) of 37.0 to 37.9 in adult (HCC) 06/03/2021   Vitamin D  deficiency 06/03/2021   Other fatigue 04/22/2021   SOB (shortness of breath) on exertion 04/22/2021   Lower extremity edema 03/05/2021   Coronary artery calcification 02/20/2020   Pure hypercholesterolemia 02/20/2020   Unilateral primary osteoarthritis, right knee 01/03/2019   Genetic testing 12/07/2018   Family history of breast cancer    Family history of prostate cancer    Family history of colon cancer    Family history of kidney cancer    Malignant neoplasm of upper-outer quadrant of right breast in female, 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/09/2018   Diabetic neuropathy, type II diabetes mellitus (HCC) 11/09/2018   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/02/2017   Trochanteric bursitis, right hip  05/10/2017   Status post total replacement of right hip 11/26/2016   DDD (degenerative disc disease), 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   Low back pain 04/23/2015   Hiatal hernia 04/13/2013   Diabetes mellitus (HCC) 01/26/2013   Umbilical hernia 07/24/2012   Essential hypertension     PCP: Okey Carlin Redbird, MD  REFERRING PROVIDER: Marilynne Rosaline SAILOR, MD   REFERRING DIAG: N32.81 (ICD-10-CM) - Overactive bladder N39.3 (ICD-10-CM) - SUI (stress urinary incontinence, female)  THERAPY DIAG:  Other muscle spasm  Muscle weakness (generalized)  Abnormal posture  Unspecified lack of coordination  Pain in left hip  Pain in right hip  Other low back pain  Rationale for Evaluation and Treatment: Rehabilitation   ONSET DATE: 1 year  SUBJECTIVE:                                                                                                                                                                                           SUBJECTIVE STATEMENT: Pt states that she is having a day where she feels like having to go all the time. She has really tried to stick with voiding schedule.    PAIN: 06/18/24 Are you having pain? Yes NPRS scale: 3/10 Pain location: low back pain; Lt hip soreness; Rt iliopsoas issue   Pain type: aching Pain description: intermittent   Aggravating factors: lifting Rt LE Relieving factors: pain medication 3x/day (would love to get off of)  PRECAUTIONS: None  RED FLAGS: None   WEIGHT BEARING RESTRICTIONS: No  FALLS:  Has patient fallen in last 6 months? Yes. Number of falls 1 fall, 3 weeks ago  OCCUPATION: Geophysical data processor   ACTIVITY LEVEL : not currently exercising   PLOF: Independent  PATIENT GOALS: to get better control over bladder  PERTINENT HISTORY:  Appendectomy, Rt breast lumpectomy, colon surgery, cholecystectomy, Lt knee arthroscopy to repair MCL, myomectomy, Rt partial mastectomy 2020, Rt colectomy, Rt THA 2018, Lt THA 2025,  Lt TKA 2019, Rt TKA 2023, umbilical hernia  BOWEL MOVEMENT: Pain with bowel movement: No Type of bowel movement:Frequency 1-2x/day and Strain yes Fully empty rectum: Yes: mostly Leakage: No Pads: Yes: see below  Fiber supplement/laxative Yes - fiber gummies   URINATION: Pain with urination: No Fully empty bladder: No - not always, some post-void dribbling or she walks around with feeling that there is still a drop or two still there (almost feels like she needs to have an orgasm) Stream: WNL Urgency: has mostly gone away Frequency: usually no trips at night anymore; every 2 hours during the day  Fluid Intake:  Leakage: Coughing, Sneezing, Laughing, Lifting, and Bending forward Pads: Yes: usually, but not more than a spot or two  INTERCOURSE:  Not sexually active anymore - widow as of 3 years ago  PREGNANCY: Vaginal deliveries 2 Tearing No Episiotomy No C-section deliveries 0 Currently pregnant No  PROLAPSE: None   OBJECTIVE:  Note: Objective measures were completed at Evaluation unless otherwise noted.   PATIENT SURVEYS:   PFIQ-7: 30  COGNITION: Overall cognitive status: Within functional limits for tasks assessed     SENSATION: Light touch: Appears intact   FUNCTIONAL TESTS:  Squat: bil UE support Single leg stance:  Rt: pelvic drop with UE support  Lt: pelvic drop with UE support Curl-up test: distortion  GAIT: Assistive device utilized: None Comments: bil trendelenburg   POSTURE: rounded shoulders, forward head, decreased lumbar lordosis, increased thoracic kyphosis, posterior pelvic tilt, and Rt posterior rotation   LUMBARAROM/PROM:  A/PROM A/PROM  Eval (% available)  Flexion 50  Extension 50  Right lateral flexion 50, pain  Left lateral flexion 50  Right rotation 50  Left rotation 50   (Blank rows = not tested)  PALPATION:   General: tightness throughout bil lumbar paraspinals; bil scar tissue restriction in hip incisions  Pelvic  Alignment: Rt posterior rotation   Abdominal: apical breathing pattern, decreased rib cage mobility                 External Perineal Exam: red and dry                             Internal Pelvic Floor: no superficial discomfort, mild discomfort in Lt deep layers, significant tightness and pain in deep Rt pelvic floor/obturator/piriformis  Patient confirms identification and approves PT to assess internal pelvic floor and treatment Yes  PELVIC MMT:   MMT eval  Vaginal 3/5, 2 second endurance, 5 repeat contraction  Diastasis Recti 2-3 finger width separation   (Blank rows = not tested)        TONE: High, Rt>Lt  PROLAPSE: Grade 1 anterior vaginal wall laxity  TODAY'S TREATMENT:                                                                                                                              DATE:  06/18/24 Neuromuscular re-education: Bridge with hip adduction, transversus abdominus, and pelvic floor muscle 2 x 10 Seated horizontal abduction + pelvic floor muscle contraction green band 2 x 10 Seated resisted rotation + pelvic floor muscle contraction green band 2 x 10 Hooklying full shoulder  flexion 10x 3 lbs, 10x 5lbs  Exercises: Lower trunk rotation 2 x 10 Therapeutic activities: Standing bil shoulder extension with marching red band 2 x 10 bil Standing 3 way kick 10x each, bil   06/13/24 Neuromuscular re-education: Seated hip adduction ball press with transversus abdominus and pelvic floor muscle 2 x 10 Seated hip abduction red band with transversus abdominus and pelvic floor muscle 2 x 10 Seated march with transversus abdominus and pelvic floor muscle 2 x 10 Seated horizontal abduction + pelvic floor muscle contraction green band 2 x 10 Seated resisted rotation + pelvic floor muscle contraction green band 2 x 10 Therapeutic activities: Voiding schedule - every 2 hours whether she needs to or not Consistent and steady intake of water   Using urge drill to help  make it to her voiding window  Standing pallof press red band 2 x 10 bil Standing bil shoulder extension red band 2 x 10 bil Squats to table + pelvic floor muscle contraction 10x   06/11/24 Manual: In Lt sidelying with pillow under Lt hip Soft tissue mobilization to  Rt flank, quadratus lumborum, lumbar paraspinals, glutes Scar tissue mobilization to Rt hip incision and surrounding area Negative pressure soft tissue mobilization to Rt hip Neuromuscular re-education: Transversus abdominus training with multimodal cues for improved motor control and breath coordination Bil supine UE ball press with transversus abdominus and pelvic floor muscle contractions and breath coordination 10x Supine hip adduction ball press with transversus abdominus and pelvic floor muscle contractions and breath coordination 10x Bridge with hip adduction, transversus abdominus, and pelvic floor muscle 2 x 10 Supine hip abduction + transversus abdominus  + pelvic floor muscle red band 2 x 10 Supine resisted march + transversus abdominus + pelvic floor muscle red band 2 x 10   PATIENT EDUCATION:  Education details: See above Person educated: Patient Education method: Programmer, multimedia, Facilities manager, Actor cues, Verbal cues, and Handouts Education comprehension: verbalized understanding  HOME EXERCISE PROGRAM: [id number]  ASSESSMENT:  CLINICAL IMPRESSION: Patient is a 78 y.o. female who was seen today for physical therapy evaluation and treatment for stress urinary incontinence, constipation, and low back pain. Pt having difficult day with urgency but does feel like urge drill is helpful. We discussed using regular vaginal moisturizer and talking with MD about use of estrogen cream to help with symptoms and help make strengthening more helpful. She demonstrates difficulty with balance activities and increased strength in Rt LE compared to Lt LE. Good tolerance to all other exercises. She will continue to benefit from  skilled PT intervention in order to decrease stress urinary incontinence, improve constipation, decrease low back/hip pain, address impairments, and improve quality of life.   OBJECTIVE IMPAIRMENTS: decreased activity tolerance, decreased coordination, decreased endurance, decreased mobility, decreased ROM, decreased strength, increased fascial restrictions, increased muscle spasms, impaired flexibility, impaired tone, improper body mechanics, postural dysfunction, and pain.   ACTIVITY LIMITATIONS: lifting, bending, and continence  PARTICIPATION LIMITATIONS: cleaning, laundry, and community activity  PERSONAL FACTORS: 1-2 comorbidities: medical history are also affecting patient's functional outcome.   REHAB POTENTIAL: Good  CLINICAL DECISION MAKING: Evolving/moderate complexity  EVALUATION COMPLEXITY: Moderate   GOALS: Goals reviewed with patient? Yes  SHORT TERM GOALS: Updated 06/06/24   Pt will be independent with HEP.   Baseline: Goal status: IN PROGRESS 06/06/24  2.  Pt will decrease pain by 25% in order to be able to perform regular exercise program to decrease urinary incontinence and pain.  Baseline:  Goal status: IN PROGRESS 06/06/24  3.  Pt will have no tenderness in bil levator ani in order to be able to ambulate and move low back/pelvis in daily activities without pain.  Baseline:  Goal status: IN PROGRESS 06/06/24  4.  Pt will increase pelvic floor muscle strength to 4/5 in order to decrease urinary incontinence and pad use.  Baseline:  Goal status: IN PROGRESS 06/06/24  5.  Pt will be independent with the knack, urge suppression technique, and double voiding in order to improve bladder habits and decrease urinary incontinence.   Baseline:  Goal status: IN PROGRESS 06/06/24  6.  Pt will be independent with use of squatty potty, relaxed toileting mechanics, and improved bowel movement techniques in order to increase ease of bowel movements and complete evacuation.    Baseline:  Goal status: IN PROGRESS 06/06/24  LONG TERM GOALS: Updated 06/06/24  Pt will be independent with advanced HEP.   Baseline:  Goal status: IN PROGRESS 06/06/24  2.  Pt will decrease pain by 75% in order to be able to perform regular exercise program to decrease urinary incontinence and pain.  Baseline:  Goal status: IN PROGRESS 06/06/24  3.  Pt will increase pelvic floor muscle endurance to greater than 10 seconds in order to decrease pad use to 0 a day.  Baseline:  Goal status: IN PROGRESS 06/06/24  4.  Pt will report no leaks with laughing, coughing, sneezing, bending, and lifting in order to improve comfort with interpersonal relationships and community activities.   Baseline:  Goal status: IN PROGRESS 06/06/24  5.  Pt will be able to lift 10lb kettle bell 5 times without pain or urinary incontinence.  Baseline:  Goal status: IN PROGRESS 06/06/24  6.  Pt will be able to stand on single LE without pelvic drop in order to improve fall risk, decrease low back pain, and help maintain progress with urinary incontinence.  Baseline:  Goal status: IN PROGRESS 06/06/24  PLAN:  PT FREQUENCY: 1-2x/week  PT DURATION: 12 weeks  PLANNED INTERVENTIONS: 97110-Therapeutic exercises, 97530- Therapeutic activity, 97112- Neuromuscular re-education, 97535- Self Care, [id number]- Manual therapy, Dry Needling, and Biofeedback  PLAN FOR NEXT SESSION: teach the knack and double voiding; internal pelvic floor muscle release to tolerance; teach negative pressure soft tissue mobilization with suction cup to scar tissue on Rt hip; manual techniques to scar tissue    Josette Mares, PT, DPT08/18/251:36 PM

## 2024-06-19 DIAGNOSIS — M47817 Spondylosis without myelopathy or radiculopathy, lumbosacral region: Secondary | ICD-10-CM | POA: Diagnosis not present

## 2024-06-19 DIAGNOSIS — G2581 Restless legs syndrome: Secondary | ICD-10-CM | POA: Diagnosis not present

## 2024-06-19 DIAGNOSIS — M25552 Pain in left hip: Secondary | ICD-10-CM | POA: Diagnosis not present

## 2024-06-19 DIAGNOSIS — M25551 Pain in right hip: Secondary | ICD-10-CM | POA: Diagnosis not present

## 2024-06-20 ENCOUNTER — Ambulatory Visit

## 2024-06-20 DIAGNOSIS — H353132 Nonexudative age-related macular degeneration, bilateral, intermediate dry stage: Secondary | ICD-10-CM | POA: Diagnosis not present

## 2024-06-25 ENCOUNTER — Ambulatory Visit

## 2024-06-25 DIAGNOSIS — M62838 Other muscle spasm: Secondary | ICD-10-CM

## 2024-06-25 DIAGNOSIS — M25551 Pain in right hip: Secondary | ICD-10-CM

## 2024-06-25 DIAGNOSIS — R279 Unspecified lack of coordination: Secondary | ICD-10-CM

## 2024-06-25 DIAGNOSIS — M25552 Pain in left hip: Secondary | ICD-10-CM

## 2024-06-25 DIAGNOSIS — M6281 Muscle weakness (generalized): Secondary | ICD-10-CM

## 2024-06-25 DIAGNOSIS — M5459 Other low back pain: Secondary | ICD-10-CM

## 2024-06-25 DIAGNOSIS — R293 Abnormal posture: Secondary | ICD-10-CM

## 2024-06-25 NOTE — Therapy (Signed)
 OUTPATIENT PHYSICAL THERAPY FEMALE PELVIC TREATMENT   Patient Name: MARIACRISTINA ADAY MRN: [id number] DOB:1946-07-26, 78 y.o., female Today's Date: 06/25/2024  END OF SESSION:  PT End of Session - 06/25/24 1231     Visit Number 6    Date for PT Re-Evaluation 07/16/24    Authorization Type [employer] $10 copay    Progress Note Due on Visit 10    PT Start Time 1230    PT Stop Time 1315    PT Time Calculation (min) 45 min    Activity Tolerance Patient tolerated treatment well    Behavior During Therapy [facility] for tasks assessed/performed             Past Medical History:  Diagnosis Date   Anxiety    Arthritis    Back, Hip- right   Atypical chest pain 06/25/2021   Back pain    Cancer (HCC)    right breast   Cataract    Colitis    Coronary artery calcification 02/20/2020   Diabetes mellitus without complication (HCC)    Type II   Edema 03/05/2021   Edema of both lower extremities    Endometriosis    Family history of adverse reaction to anesthesia    Father - N/V - blood pressure; daughter N/V   Family history of breast cancer    Family history of colon cancer    Family history of kidney cancer    Family history of prostate cancer    Fatty liver    GERD (gastroesophageal reflux disease)    History of hiatal hernia    History of kidney stones    History of ulcerative colitis    Hypertension    Insomnia    Joint pain    Kidney problem    Lower extremity edema 03/05/2021   Lumbar disc disease    L4 L5   Neuropathy    Osteoarthritis    Other hyperlipidemia    Palpitations    Personal history of radiation therapy    Pneumonia    1983, 2003   Pure hypercholesterolemia 02/20/2020   PVCs (premature ventricular contractions)    benign   Umbilical hernia    Vitamin D  deficiency    Wears glasses    Past Surgical History:  Procedure Laterality Date   APPENDECTOMY     BREAST BIOPSY  08-13-2009  DR TSUEI   EXCISION LEFT NIPPLE DUCT   BREAST EXCISIONAL  BIOPSY Left    x2   BREAST EXCISIONAL BIOPSY Right    BREAST LUMPECTOMY Right 11/16/2018   invasive ductal   CHOLECYSTECTOMY  1992   COLON RESECTION  1986   ULCERATIVE COLITIS   COLON SURGERY     ESOPHAGEAL MANOMETRY N/A 06/11/2013   Procedure: ESOPHAGEAL MANOMETRY (EM);  Surgeon: Oliva FORBES Boots, MD;  Location: [facility] ENDOS[facility];  Service: Endoscopy;  Laterality: N/A;   ESOPHAGOGASTRODUODENOSCOPY (EGD) WITH PROPOFOL  N/A 05/31/2016   Procedure: ESOPHAGOGASTRODUODENOSCOPY (EGD) WITH PROPOFOL ;  Surgeon: Oliva Boots, MD;  Location: [facility] ENDOSCOPY;  Service: Endoscopy;  Laterality: N/A;   EXCISION RIGHT BREAST MASS   10-20-2005  DR MAUDE YOUNG   EYE SURGERY Bilateral 2023   cataract   FRACTURE SURGERY     left arm   kidney stone removed  12/2009   KNEE ARTHROSCOPY Left    MCL   LEFT THUMB CARPOMETACARPAL JOINT SUSPENSIONPLASTY  04-06-2006  DR SISSY   LEFT WRIST ARTHROSCOPY W/ DEBRIDEMENT AND REMOVAL CYST  03-26-2004  DR SISSY   MYOMECTOMY  RADIOACTIVE SEED GUIDED PARTIAL MASTECTOMY WITH AXILLARY SENTINEL LYMPH NODE BIOPSY Right 11/16/2018   Procedure: RIGHT BREAST RADIOACTIVE SEED GUIDED PARTIAL MASTECTOMY WITH  SENTINEL  NODE BIOPSY;  Surgeon: Vernetta Berg, MD;  Location: [facility] OR;  Service: General;  Laterality: Right;   RIGHT COLECTOMY     RIGHT URETEROSCOPIC STONE EXTRACTION  12-11-2009  DR NORLEEN [facility]   TENDON RELEASE     TONSILLECTOMY     TOTAL HIP ARTHROPLASTY Right 11/26/2016   Procedure: RIGHT TOTAL HIP ARTHROPLASTY ANTERIOR APPROACH;  Surgeon: Lonni CINDERELLA Vernetta, MD;  Location: [facility] ORS;  Service: Orthopedics;  Laterality: Right;   TOTAL HIP ARTHROPLASTY Left 12/06/2023   Procedure: LEFT TOTAL HIP ARTHROPLASTY ANTERIOR APPROACH;  Surgeon: Vernetta Lonni CINDERELLA, MD;  Location: [facility] OR;  Service: Orthopedics;  Laterality: Left;   TOTAL KNEE ARTHROPLASTY Left 12/02/2017   Procedure: LEFT TOTAL KNEE ARTHROPLASTY;  Surgeon: Vernetta Lonni CINDERELLA, MD;  Location: [facility] ORS;  Service:  Orthopedics;  Laterality: Left;   TOTAL KNEE ARTHROPLASTY Right 08/03/2022   Procedure: RIGHT TOTAL KNEE ARTHROPLASTY;  Surgeon: Vernetta Lonni CINDERELLA, MD;  Location: [facility] OR;  Service: Orthopedics;  Laterality: Right;   WRIST SURGERY     both   Patient Active Problem List   Diagnosis Date Noted   History of iron deficiency 05/24/2024   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   Falls 05/01/2024   Iron deficiency 05/01/2024   Overactive bladder 02/22/2024   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/2023   SUI (stress urinary incontinence, female) 11/22/2023   Restless leg syndrome 09/22/2023   Other hyperlipidemia 06/08/2023   Unilateral primary osteoarthritis, left hip 05/11/2023   Depression 03/16/2023   Allergic rhinitis 12/29/2022   Seasonal affective disorder (HCC) 12/01/2022   BMI 32.0-32.9,adult 12/01/2022   Obesity, Beginning BMI 37.59 12/01/2022   OA (osteoarthritis) of knee 08/03/2022   Status post total right knee replacement 08/03/2022   Stress 07/19/2022   Other constipation 07/19/2022   Atypical chest pain 06/25/2021   Class 2 severe obesity with serious comorbidity and body mass index (BMI) of 37.0 to 37.9 in adult (HCC) 06/03/2021   Vitamin D  deficiency 06/03/2021   Other fatigue 04/22/2021   SOB (shortness of breath) on exertion 04/22/2021   Lower extremity edema 03/05/2021   Coronary artery calcification 02/20/2020   Pure hypercholesterolemia 02/20/2020   Unilateral primary osteoarthritis, right knee 01/03/2019   Genetic testing 12/07/2018   Family history of breast cancer    Family history of prostate cancer    Family history of colon cancer    Family history of kidney cancer    Malignant neoplasm of upper-outer quadrant of right breast in female, 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/09/2018   Diabetic neuropathy, type II diabetes mellitus (HCC) 11/09/2018   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/02/2017   Trochanteric bursitis, right hip  05/10/2017   Status post total replacement of right hip 11/26/2016   DDD (degenerative disc disease), 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   Low back pain 04/23/2015   Hiatal hernia 04/13/2013   Diabetes mellitus (HCC) 01/26/2013   Umbilical hernia 07/24/2012   Essential hypertension     PCP: Okey Carlin Redbird, MD  REFERRING PROVIDER: Marilynne Rosaline SAILOR, MD   REFERRING DIAG: N32.81 (ICD-10-CM) - Overactive bladder N39.3 (ICD-10-CM) - SUI (stress urinary incontinence, female)  THERAPY DIAG:  Other muscle spasm  Muscle weakness (generalized)  Abnormal posture  Unspecified lack of coordination  Pain in left hip  Pain in right hip  Other low back pain  Rationale for Evaluation and Treatment: Rehabilitation   ONSET DATE: 1 year  SUBJECTIVE:                                                                                                                                                                                           SUBJECTIVE STATEMENT: Pt  states that she had an episode last week in which she was very nervous and jittery and reports that is why she had to cancel her appointment. She states that she is having to strain more with bowel movements. She has not been using squatty potty.    PAIN: 06/25/24 Are you having pain? Yes NPRS scale: 3/10 Pain location: low back pain; Lt hip soreness; Rt iliopsoas issue   Pain type: aching Pain description: intermittent   Aggravating factors: lifting Rt LE Relieving factors: pain medication 3x/day (would love to get off of)  PRECAUTIONS: None  RED FLAGS: None   WEIGHT BEARING RESTRICTIONS: No  FALLS:  Has patient fallen in last 6 months? Yes. Number of falls 1 fall, 3 weeks ago  OCCUPATION: Geophysical data processor   ACTIVITY LEVEL : not currently exercising   PLOF: Independent  PATIENT GOALS: to get better control over bladder  PERTINENT HISTORY:  Appendectomy, Rt breast lumpectomy, colon surgery, cholecystectomy,  Lt knee arthroscopy to repair MCL, myomectomy, Rt partial mastectomy 2020, Rt colectomy, Rt THA 2018, Lt THA 2025, Lt TKA 2019, Rt TKA 2023, umbilical hernia  BOWEL MOVEMENT: Pain with bowel movement: No Type of bowel movement:Frequency 1-2x/day and Strain yes Fully empty rectum: Yes: mostly Leakage: No Pads: Yes: see below  Fiber supplement/laxative Yes - fiber gummies   URINATION: Pain with urination: No Fully empty bladder: No - not always, some post-void dribbling or she walks around with feeling that there is still a drop or two still there (almost feels like she needs to have an orgasm) Stream: WNL Urgency: has mostly gone away Frequency: usually no trips at night anymore; every 2 hours during the day  Fluid Intake:  Leakage: Coughing, Sneezing, Laughing, Lifting, and Bending forward Pads: Yes: usually, but not more than a spot or two  INTERCOURSE:  Not sexually active anymore - widow as of 3 years ago  PREGNANCY: Vaginal deliveries 2 Tearing No Episiotomy No C-section deliveries 0 Currently pregnant No  PROLAPSE: None   OBJECTIVE:  Note: Objective measures were completed at Evaluation unless otherwise noted.   PATIENT SURVEYS:   PFIQ-7: 22  COGNITION: Overall cognitive status: Within functional limits for tasks assessed     SENSATION: Light touch: Appears intact   FUNCTIONAL TESTS:  Squat: bil UE support Single  leg stance:  Rt: pelvic drop with UE support  Lt: pelvic drop with UE support Curl-up test: distortion   GAIT: Assistive device utilized: None Comments: bil trendelenburg   POSTURE: rounded shoulders, forward head, decreased lumbar lordosis, increased thoracic kyphosis, posterior pelvic tilt, and Rt posterior rotation   LUMBARAROM/PROM:  A/PROM A/PROM  Eval (% available)  Flexion 50  Extension 50  Right lateral flexion 50, pain  Left lateral flexion 50  Right rotation 50  Left rotation 50   (Blank rows = not  tested)  PALPATION:   General: tightness throughout bil lumbar paraspinals; bil scar tissue restriction in hip incisions  Pelvic Alignment: Rt posterior rotation   Abdominal: apical breathing pattern, decreased rib cage mobility                 External Perineal Exam: red and dry                             Internal Pelvic Floor: no superficial discomfort, mild discomfort in Lt deep layers, significant tightness and pain in deep Rt pelvic floor/obturator/piriformis  Patient confirms identification and approves PT to assess internal pelvic floor and treatment Yes  PELVIC MMT:   MMT eval  Vaginal 3/5, 2 second endurance, 5 repeat contraction  Diastasis Recti 2-3 finger width separation   (Blank rows = not tested)        TONE: High, Rt>Lt  PROLAPSE: Grade 1 anterior vaginal wall laxity  TODAY'S TREATMENT:                                                                                                                              DATE:  06/25/24 Manual: Supine abdominal scar tissue mobilization  Supine bowel mobilization  Supine lower rib cage mobilization Neuromuscular re-education: Supine chin tucks/lift 2 x 10 with manual rib cage compression in order to help improve upper abdominal activation  Supine full shoulder flexion 3 lbs with manual rib cage compression in order to help improve upper abdominal activation 2 x 10 Supine bent knee fall out + core activation 10x bil Seated resisted rotation + pelvic floor muscle contraction green band 2 x 10   06/18/24 Neuromuscular re-education: Bridge with hip adduction, transversus abdominus, and pelvic floor muscle 2 x 10 Seated horizontal abduction + pelvic floor muscle contraction green band 2 x 10 Seated resisted rotation + pelvic floor muscle contraction green band 2 x 10 Hooklying full shoulder  flexion 10x 3 lbs, 10x 5lbs  Exercises: Lower trunk rotation 2 x 10 Therapeutic activities: Standing bil shoulder extension with  marching red band 2 x 10 bil Standing 3 way kick 10x each, bil   06/13/24 Neuromuscular re-education: Seated hip adduction ball press with transversus abdominus and pelvic floor muscle 2 x 10 Seated hip abduction red band with transversus abdominus and pelvic floor muscle 2 x 10 Seated march with transversus abdominus and pelvic floor  muscle 2 x 10 Seated horizontal abduction + pelvic floor muscle contraction green band 2 x 10 Seated resisted rotation + pelvic floor muscle contraction green band 2 x 10 Therapeutic activities: Voiding schedule - every 2 hours whether she needs to or not Consistent and steady intake of water   Using urge drill to help make it to her voiding window  Standing pallof press red band 2 x 10 bil Standing bil shoulder extension red band 2 x 10 bil Squats to table + pelvic floor muscle contraction 10x   PATIENT EDUCATION:  Education details: See above Person educated: Patient Education method: Explanation, Demonstration, Tactile cues, Verbal cues, and Handouts Education comprehension: verbalized understanding  HOME EXERCISE PROGRAM: [id number]  ASSESSMENT:  CLINICAL IMPRESSION: Patient is a 78 y.o. female who was seen today for physical therapy evaluation and treatment for stress urinary incontinence, constipation, and low back pain. Pt had difficult week with anxiety and constipation. Believe that increased constipation may be due to her anxiety increase. She was encouraged to return using her squatty potty; abdominal mobilization techniques performed to help improve motility in bowel and reduce restriction. Due to large increase in sternocostal angle that has not reduced, we performed several exercises to focus on upper abdominal activation in order to allow for better diaphragm/pelvic floor muscle activation. This was very helpful and increased her ability to engage abdominals; she did still have tendency to bear down and push stomach out, but improved through  session and demonstrated good proprioception of performing correctly. She will continue to benefit from skilled PT intervention in order to decrease stress urinary incontinence, improve constipation, decrease low back/hip pain, address impairments, and improve quality of life.   OBJECTIVE IMPAIRMENTS: decreased activity tolerance, decreased coordination, decreased endurance, decreased mobility, decreased ROM, decreased strength, increased fascial restrictions, increased muscle spasms, impaired flexibility, impaired tone, improper body mechanics, postural dysfunction, and pain.   ACTIVITY LIMITATIONS: lifting, bending, and continence  PARTICIPATION LIMITATIONS: cleaning, laundry, and community activity  PERSONAL FACTORS: 1-2 comorbidities: medical history are also affecting patient's functional outcome.   REHAB POTENTIAL: Good  CLINICAL DECISION MAKING: Evolving/moderate complexity  EVALUATION COMPLEXITY: Moderate   GOALS: Goals reviewed with patient? Yes  SHORT TERM GOALS: Updated 06/06/24   Pt will be independent with HEP.   Baseline: Goal status: IN PROGRESS 06/06/24  2.  Pt will decrease pain by 25% in order to be able to perform regular exercise program to decrease urinary incontinence and pain.  Baseline:  Goal status: IN PROGRESS 06/06/24  3.  Pt will have no tenderness in bil levator ani in order to be able to ambulate and move low back/pelvis in daily activities without pain.  Baseline:  Goal status: IN PROGRESS 06/06/24  4.  Pt will increase pelvic floor muscle strength to 4/5 in order to decrease urinary incontinence and pad use.  Baseline:  Goal status: IN PROGRESS 06/06/24  5.  Pt will be independent with the knack, urge suppression technique, and double voiding in order to improve bladder habits and decrease urinary incontinence.   Baseline:  Goal status: IN PROGRESS 06/06/24  6.  Pt will be independent with use of squatty potty, relaxed toileting mechanics, and improved  bowel movement techniques in order to increase ease of bowel movements and complete evacuation.   Baseline:  Goal status: IN PROGRESS 06/06/24  LONG TERM GOALS: Updated 06/06/24  Pt will be independent with advanced HEP.   Baseline:  Goal status: IN PROGRESS 06/06/24  2.  Pt will decrease pain  by 75% in order to be able to perform regular exercise program to decrease urinary incontinence and pain.  Baseline:  Goal status: IN PROGRESS 06/06/24  3.  Pt will increase pelvic floor muscle endurance to greater than 10 seconds in order to decrease pad use to 0 a day.  Baseline:  Goal status: IN PROGRESS 06/06/24  4.  Pt will report no leaks with laughing, coughing, sneezing, bending, and lifting in order to improve comfort with interpersonal relationships and community activities.   Baseline:  Goal status: IN PROGRESS 06/06/24  5.  Pt will be able to lift 10lb kettle bell 5 times without pain or urinary incontinence.  Baseline:  Goal status: IN PROGRESS 06/06/24  6.  Pt will be able to stand on single LE without pelvic drop in order to improve fall risk, decrease low back pain, and help maintain progress with urinary incontinence.  Baseline:  Goal status: IN PROGRESS 06/06/24  PLAN:  PT FREQUENCY: 1-2x/week  PT DURATION: 12 weeks  PLANNED INTERVENTIONS: 97110-Therapeutic exercises, 97530- Therapeutic activity, 97112- Neuromuscular re-education, 97535- Self Care, [id number]- Manual therapy, Dry Needling, and Biofeedback  PLAN FOR NEXT SESSION: teach the knack and double voiding; internal pelvic floor muscle release to tolerance; teach negative pressure soft tissue mobilization with suction cup to scar tissue on Rt hip; manual techniques to scar tissue    Josette Mares, PT, DPT08/25/251:15 PM

## 2024-06-27 ENCOUNTER — Ambulatory Visit

## 2024-06-27 DIAGNOSIS — M62838 Other muscle spasm: Secondary | ICD-10-CM

## 2024-06-27 DIAGNOSIS — R293 Abnormal posture: Secondary | ICD-10-CM

## 2024-06-27 DIAGNOSIS — R279 Unspecified lack of coordination: Secondary | ICD-10-CM

## 2024-06-27 DIAGNOSIS — M6281 Muscle weakness (generalized): Secondary | ICD-10-CM

## 2024-06-27 NOTE — Progress Notes (Signed)
       Full Name: Lauren [name] Gender: Female MRN #: [id number] Date of Birth: 1946-05-06    Visit Date: 06/28/2024 08:18 Age: 78 Years  History: Lower extremity evaluation for neuropathy or radiculopathy as cause for leg symptoms vs restless leg syndrome  Summary: emg/ncs was performed on the lower extremity. Left superficial sebsory nerve showed no response. All remaining nerves (as indicated in the following tables) were within normal limits.  All muscles (as indicated in the following tables) were within normal limits.      Conclusion: This is a normal exam of the left lower extremity. No evidence for peripheral polyneuropathy or lumbar radiculopathy. The absence of peroneal sensory conduction can be seen normally in aging.   [name], M.D.  [facility] [address], [address] [place], [place] [address] Tel: [phone] Fax: [phone]  Verbal informed consent was obtained from the patient, patient was informed of potential risk of procedure, including bruising, bleeding, hematoma formation, infection, muscle weakness, muscle pain, numbness, among others.        MNC    Nerve / Sites Muscle Latency Ref. Amplitude Ref. Rel Amp Segments Distance Velocity Ref. Area    ms ms mV mV %  cm m/s m/s mVms  L Peroneal - EDB     Ankle EDB 4.9 <=6.5 2.0 >=2.0 100 Ankle - EDB 5   8.4     Fib head EDB 12.2  2.1  102 Fib head - Ankle 31 44 >=44 8.7     Pop fossa EDB 14.5  1.9  93.6 Pop fossa - Fib head 10 45 >=44 8.3         Pop fossa - Ankle      L Tibial - AH     Ankle AH 3.3 <=5.8 6.2 >=4.0 100 Ankle - AH 9   28.8     Pop fossa AH 12.8  3.1  49.2 Pop fossa - Ankle 42 44 >=41 15.3         SNC    Nerve / Sites Rec. Site Peak Lat Ref.  Amp Ref. Segments Distance    ms ms V V  cm  L Sural - Ankle (Calf)     Calf Ankle 3.9 <=4.4 6 >=6 Calf - Ankle 14  L Superficial peroneal - Ankle     Lat leg Ankle NR <=4.4 NR >=6 Lat leg - Ankle 14         F  Wave     Nerve F Lat Ref.   ms ms  L Tibial - AH 52.6 <=56.0       EMG Summary Table    Spontaneous MUAP Recruitment  Muscle IA Fib PSW Fasc Other Amp Dur. Poly Pattern  L. Vastus medialis Normal None None None _______ Normal Normal Normal Normal  L. Tibialis anterior Normal None None None _______ Normal Normal Normal Normal  L. Gastrocnemius (Medial head) Normal None None None _______ Normal Normal Normal Normal  L. Biceps femoris (long head) Normal None None None _______ Normal Normal Normal Normal  L. Gluteus maximus Normal None None None _______ Normal Normal Normal Normal  L. Gluteus medius Normal None None None _______ Normal Normal Normal Normal  L. Lumbar paraspinals (low) Normal None None None _______ Normal Normal Normal Normal

## 2024-06-27 NOTE — Therapy (Signed)
 OUTPATIENT PHYSICAL THERAPY FEMALE PELVIC TREATMENT   Patient Name: Lauren [name] MRN: [id number] DOB:1946/05/25, 78 y.o., female Today's Date: 06/27/2024  END OF SESSION:  PT End of Session - 06/27/24 1231     Visit Number 7    Date for PT Re-Evaluation 07/16/24    Authorization Type [employer] $10 copay    Progress Note Due on Visit 10    PT Start Time 1230    PT Stop Time 1310    PT Time Calculation (min) 40 min    Activity Tolerance Patient tolerated treatment well    Behavior During Therapy [facility] for tasks assessed/performed             Past Medical History:  Diagnosis Date   Anxiety    Arthritis    Back, Hip- right   Atypical chest pain 06/25/2021   Back pain    Cancer (HCC)    right breast   Cataract    Colitis    Coronary artery calcification 02/20/2020   Diabetes mellitus without complication (HCC)    Type II   Edema 03/05/2021   Edema of both lower extremities    Endometriosis    Family history of adverse reaction to anesthesia    Father - N/V - blood pressure; daughter N/V   Family history of breast cancer    Family history of colon cancer    Family history of kidney cancer    Family history of prostate cancer    Fatty liver    GERD (gastroesophageal reflux disease)    History of hiatal hernia    History of kidney stones    History of ulcerative colitis    Hypertension    Insomnia    Joint pain    Kidney problem    Lower extremity edema 03/05/2021   Lumbar disc disease    L4 L5   Neuropathy    Osteoarthritis    Other hyperlipidemia    Palpitations    Personal history of radiation therapy    Pneumonia    1983, 2003   Pure hypercholesterolemia 02/20/2020   PVCs (premature ventricular contractions)    benign   Umbilical hernia    Vitamin D  deficiency    Wears glasses    Past Surgical History:  Procedure Laterality Date   APPENDECTOMY     BREAST BIOPSY  08-13-2009  DR TSUEI   EXCISION LEFT NIPPLE DUCT   BREAST EXCISIONAL  BIOPSY Left    x2   BREAST EXCISIONAL BIOPSY Right    BREAST LUMPECTOMY Right 11/16/2018   invasive ductal   CHOLECYSTECTOMY  1992   COLON RESECTION  1986   ULCERATIVE COLITIS   COLON SURGERY     ESOPHAGEAL MANOMETRY N/A 06/11/2013   Procedure: ESOPHAGEAL MANOMETRY (EM);  Surgeon: Oliva FORBES Boots, MD;  Location: [facility] ENDOS[facility];  Service: Endoscopy;  Laterality: N/A;   ESOPHAGOGASTRODUODENOSCOPY (EGD) WITH PROPOFOL  N/A 05/31/2016   Procedure: ESOPHAGOGASTRODUODENOSCOPY (EGD) WITH PROPOFOL ;  Surgeon: Oliva Boots, MD;  Location: [facility] ENDOSCOPY;  Service: Endoscopy;  Laterality: N/A;   EXCISION RIGHT BREAST MASS   10-20-2005  DR MAUDE YOUNG   EYE SURGERY Bilateral 2023   cataract   FRACTURE SURGERY     left arm   kidney stone removed  12/2009   KNEE ARTHROSCOPY Left    MCL   LEFT THUMB CARPOMETACARPAL JOINT SUSPENSIONPLASTY  04-06-2006  DR SISSY   LEFT WRIST ARTHROSCOPY W/ DEBRIDEMENT AND REMOVAL CYST  03-26-2004  DR SISSY   MYOMECTOMY  RADIOACTIVE SEED GUIDED PARTIAL MASTECTOMY WITH AXILLARY SENTINEL LYMPH NODE BIOPSY Right 11/16/2018   Procedure: RIGHT BREAST RADIOACTIVE SEED GUIDED PARTIAL MASTECTOMY WITH  SENTINEL  NODE BIOPSY;  Surgeon: Vernetta Berg, MD;  Location: [facility] OR;  Service: General;  Laterality: Right;   RIGHT COLECTOMY     RIGHT URETEROSCOPIC STONE EXTRACTION  12-11-2009  DR NORLEEN [facility]   TENDON RELEASE     TONSILLECTOMY     TOTAL HIP ARTHROPLASTY Right 11/26/2016   Procedure: RIGHT TOTAL HIP ARTHROPLASTY ANTERIOR APPROACH;  Surgeon: Lonni CINDERELLA Vernetta, MD;  Location: [facility] ORS;  Service: Orthopedics;  Laterality: Right;   TOTAL HIP ARTHROPLASTY Left 12/06/2023   Procedure: LEFT TOTAL HIP ARTHROPLASTY ANTERIOR APPROACH;  Surgeon: Vernetta Lonni CINDERELLA, MD;  Location: [facility] OR;  Service: Orthopedics;  Laterality: Left;   TOTAL KNEE ARTHROPLASTY Left 12/02/2017   Procedure: LEFT TOTAL KNEE ARTHROPLASTY;  Surgeon: Vernetta Lonni CINDERELLA, MD;  Location: [facility];  Service:  Orthopedics;  Laterality: Left;   TOTAL KNEE ARTHROPLASTY Right 08/03/2022   Procedure: RIGHT TOTAL KNEE ARTHROPLASTY;  Surgeon: Vernetta Lonni CINDERELLA, MD;  Location: [facility] OR;  Service: Orthopedics;  Laterality: Right;   WRIST SURGERY     both   Patient Active Problem List   Diagnosis Date Noted   History of iron deficiency 05/24/2024   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   Falls 05/01/2024   Iron deficiency 05/01/2024   Overactive bladder 02/22/2024   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/2023   SUI (stress urinary incontinence, female) 11/22/2023   Restless leg syndrome 09/22/2023   Other hyperlipidemia 06/08/2023   Unilateral primary osteoarthritis, left hip 05/11/2023   Depression 03/16/2023   Allergic rhinitis 12/29/2022   Seasonal affective disorder (HCC) 12/01/2022   BMI 32.0-32.9,adult 12/01/2022   Obesity, Beginning BMI 37.59 12/01/2022   OA (osteoarthritis) of knee 08/03/2022   Status post total right knee replacement 08/03/2022   Stress 07/19/2022   Other constipation 07/19/2022   Atypical chest pain 06/25/2021   Class 2 severe obesity with serious comorbidity and body mass index (BMI) of 37.0 to 37.9 in adult (HCC) 06/03/2021   Vitamin D  deficiency 06/03/2021   Other fatigue 04/22/2021   SOB (shortness of breath) on exertion 04/22/2021   Lower extremity edema 03/05/2021   Coronary artery calcification 02/20/2020   Pure hypercholesterolemia 02/20/2020   Unilateral primary osteoarthritis, right knee 01/03/2019   Genetic testing 12/07/2018   Family history of breast cancer    Family history of prostate cancer    Family history of colon cancer    Family history of kidney cancer    Malignant neoplasm of upper-outer quadrant of right breast in female, 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/09/2018   Diabetic neuropathy, type II diabetes mellitus (HCC) 11/09/2018   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/02/2017   Trochanteric bursitis, right hip  05/10/2017   Status post total replacement of right hip 11/26/2016   DDD (degenerative disc disease), 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   Low back pain 04/23/2015   Hiatal hernia 04/13/2013   Diabetes mellitus (HCC) 01/26/2013   Umbilical hernia 07/24/2012   Essential hypertension     PCP: Okey Carlin Redbird, MD  REFERRING PROVIDER: Marilynne Rosaline SAILOR, MD   REFERRING DIAG: N32.81 (ICD-10-CM) - Overactive bladder N39.3 (ICD-10-CM) - SUI (stress urinary incontinence, female)  THERAPY DIAG:  Other muscle spasm  Muscle weakness (generalized)  Unspecified lack of coordination  Abnormal posture  Rationale for Evaluation and Treatment: Rehabilitation   ONSET DATE: 1 year  SUBJECTIVE:  SUBJECTIVE STATEMENT: Pt reports that she has had improved bowel movements.    PAIN: 06/27/24 Are you having pain? Yes NPRS scale: 6/10 Pain location: low back pain; Lt hip soreness; Rt iliopsoas issue   Pain type: aching Pain description: intermittent   Aggravating factors: lifting Rt LE Relieving factors: pain medication 3x/day (would love to get off of)  PRECAUTIONS: None  RED FLAGS: None   WEIGHT BEARING RESTRICTIONS: No  FALLS:  Has patient fallen in last 6 months? Yes. Number of falls 1 fall, 3 weeks ago  OCCUPATION: Geophysical data processor   ACTIVITY LEVEL : not currently exercising   PLOF: Independent  PATIENT GOALS: to get better control over bladder  PERTINENT HISTORY:  Appendectomy, Rt breast lumpectomy, colon surgery, cholecystectomy, Lt knee arthroscopy to repair MCL, myomectomy, Rt partial mastectomy 2020, Rt colectomy, Rt THA 2018, Lt THA 2025, Lt TKA 2019, Rt TKA 2023, umbilical hernia  BOWEL MOVEMENT: Pain with bowel movement: No Type of bowel movement:Frequency 1-2x/day and Strain  yes Fully empty rectum: Yes: mostly Leakage: No Pads: Yes: see below  Fiber supplement/laxative Yes - fiber gummies   URINATION: Pain with urination: No Fully empty bladder: No - not always, some post-void dribbling or she walks around with feeling that there is still a drop or two still there (almost feels like she needs to have an orgasm) Stream: WNL Urgency: has mostly gone away Frequency: usually no trips at night anymore; every 2 hours during the day  Fluid Intake:  Leakage: Coughing, Sneezing, Laughing, Lifting, and Bending forward Pads: Yes: usually, but not more than a spot or two  INTERCOURSE:  Not sexually active anymore - widow as of 3 years ago  PREGNANCY: Vaginal deliveries 2 Tearing No Episiotomy No C-section deliveries 0 Currently pregnant No  PROLAPSE: None   OBJECTIVE:  Note: Objective measures were completed at Evaluation unless otherwise noted.   PATIENT SURVEYS:   PFIQ-7: 62  COGNITION: Overall cognitive status: Within functional limits for tasks assessed     SENSATION: Light touch: Appears intact   FUNCTIONAL TESTS:  Squat: bil UE support Single leg stance:  Rt: pelvic drop with UE support  Lt: pelvic drop with UE support Curl-up test: distortion   GAIT: Assistive device utilized: None Comments: bil trendelenburg   POSTURE: rounded shoulders, forward head, decreased lumbar lordosis, increased thoracic kyphosis, posterior pelvic tilt, and Rt posterior rotation   LUMBARAROM/PROM:  A/PROM A/PROM  Eval (% available)  Flexion 50  Extension 50  Right lateral flexion 50, pain  Left lateral flexion 50  Right rotation 50  Left rotation 50   (Blank rows = not tested)  PALPATION:   General: tightness throughout bil lumbar paraspinals; bil scar tissue restriction in hip incisions  Pelvic Alignment: Rt posterior rotation   Abdominal: apical breathing pattern, decreased rib cage mobility                 External Perineal Exam: red  and dry                             Internal Pelvic Floor: no superficial discomfort, mild discomfort in Lt deep layers, significant tightness and pain in deep Rt pelvic floor/obturator/piriformis  Patient confirms identification and approves PT to assess internal pelvic floor and treatment Yes  PELVIC MMT:   MMT eval  Vaginal 3/5, 2 second endurance, 5 repeat contraction  Diastasis Recti 2-3 finger width separation   (Blank rows =  not tested)        TONE: High, Rt>Lt  PROLAPSE: Grade 1 anterior vaginal wall laxity  TODAY'S TREATMENT:                                                                                                                              DATE:  06/27/24 Manual: Supine abdominal scar tissue mobilization  Supine bowel mobilization  Supine lower rib cage mobilization Supine Rt hip scar tissue mobilization Supine Rt hip soft tissue mobilization    06/25/24 Manual: Supine abdominal scar tissue mobilization  Supine bowel mobilization  Supine lower rib cage mobilization Neuromuscular re-education: Supine chin tucks/lift 2 x 10 with manual rib cage compression in order to help improve upper abdominal activation  Supine full shoulder flexion 3 lbs with manual rib cage compression in order to help improve upper abdominal activation 2 x 10 Supine bent knee fall out + core activation 10x bil Seated resisted rotation + pelvic floor muscle contraction green band 2 x 10   06/18/24 Neuromuscular re-education: Bridge with hip adduction, transversus abdominus, and pelvic floor muscle 2 x 10 Seated horizontal abduction + pelvic floor muscle contraction green band 2 x 10 Seated resisted rotation + pelvic floor muscle contraction green band 2 x 10 Hooklying full shoulder  flexion 10x 3 lbs, 10x 5lbs  Exercises: Lower trunk rotation 2 x 10 Therapeutic activities: Standing bil shoulder extension with marching red band 2 x 10 bil Standing 3 way kick 10x each,  bil   PATIENT EDUCATION:  Education details: See above Person educated: Patient Education method: Explanation, Demonstration, Tactile cues, Verbal cues, and Handouts Education comprehension: verbalized understanding  HOME EXERCISE PROGRAM: [id number]  ASSESSMENT:  CLINICAL IMPRESSION: Patient is a 78 y.o. female who was seen today for physical therapy evaluation and treatment for stress urinary incontinence, constipation, and low back pain. Due to benefit of manual techniques to abdomen last session, we continued these today to help improve motility and continue progress. She also reported that she would have difficulty performing exercises due to notable Rt hip pain. We focused scar tissue mobilization to this area today with soft tissue mobilization as well. She had good release of restricted tissue and tolerated well, able to ambulate with improved pain at end of session. Pt had difficult week with anxiety and constipation. She will continue to benefit from skilled PT intervention in order to decrease stress urinary incontinence, improve constipation, decrease low back/hip pain, address impairments, and improve quality of life.   OBJECTIVE IMPAIRMENTS: decreased activity tolerance, decreased coordination, decreased endurance, decreased mobility, decreased ROM, decreased strength, increased fascial restrictions, increased muscle spasms, impaired flexibility, impaired tone, improper body mechanics, postural dysfunction, and pain.   ACTIVITY LIMITATIONS: lifting, bending, and continence  PARTICIPATION LIMITATIONS: cleaning, laundry, and community activity  PERSONAL FACTORS: 1-2 comorbidities: medical history are also affecting patient's functional outcome.   REHAB POTENTIAL: Good  CLINICAL DECISION MAKING: Evolving/moderate complexity  EVALUATION COMPLEXITY: Moderate  GOALS: Goals reviewed with patient? Yes  SHORT TERM GOALS: Updated 06/06/24   Pt will be independent with HEP.    Baseline: Goal status: IN PROGRESS 06/06/24  2.  Pt will decrease pain by 25% in order to be able to perform regular exercise program to decrease urinary incontinence and pain.  Baseline:  Goal status: IN PROGRESS 06/06/24  3.  Pt will have no tenderness in bil levator ani in order to be able to ambulate and move low back/pelvis in daily activities without pain.  Baseline:  Goal status: IN PROGRESS 06/06/24  4.  Pt will increase pelvic floor muscle strength to 4/5 in order to decrease urinary incontinence and pad use.  Baseline:  Goal status: IN PROGRESS 06/06/24  5.  Pt will be independent with the knack, urge suppression technique, and double voiding in order to improve bladder habits and decrease urinary incontinence.   Baseline:  Goal status: IN PROGRESS 06/06/24  6.  Pt will be independent with use of squatty potty, relaxed toileting mechanics, and improved bowel movement techniques in order to increase ease of bowel movements and complete evacuation.   Baseline:  Goal status: IN PROGRESS 06/06/24  LONG TERM GOALS: Updated 06/06/24  Pt will be independent with advanced HEP.   Baseline:  Goal status: IN PROGRESS 06/06/24  2.  Pt will decrease pain by 75% in order to be able to perform regular exercise program to decrease urinary incontinence and pain.  Baseline:  Goal status: IN PROGRESS 06/06/24  3.  Pt will increase pelvic floor muscle endurance to greater than 10 seconds in order to decrease pad use to 0 a day.  Baseline:  Goal status: IN PROGRESS 06/06/24  4.  Pt will report no leaks with laughing, coughing, sneezing, bending, and lifting in order to improve comfort with interpersonal relationships and community activities.   Baseline:  Goal status: IN PROGRESS 06/06/24  5.  Pt will be able to lift 10lb kettle bell 5 times without pain or urinary incontinence.  Baseline:  Goal status: IN PROGRESS 06/06/24  6.  Pt will be able to stand on single LE without pelvic drop in order to  improve fall risk, decrease low back pain, and help maintain progress with urinary incontinence.  Baseline:  Goal status: IN PROGRESS 06/06/24  PLAN:  PT FREQUENCY: 1-2x/week  PT DURATION: 12 weeks  PLANNED INTERVENTIONS: 97110-Therapeutic exercises, 97530- Therapeutic activity, 97112- Neuromuscular re-education, 97535- Self Care, [id number]- Manual therapy, Dry Needling, and Biofeedback  PLAN FOR NEXT SESSION: teach the knack and double voiding; internal pelvic floor muscle release to tolerance; teach negative pressure soft tissue mobilization with suction cup to scar tissue on Rt hip; manual techniques to scar tissue    Josette Mares, PT, DPT08/27/2512:32 PM

## 2024-06-28 ENCOUNTER — Ambulatory Visit: Admitting: Neurology

## 2024-06-28 ENCOUNTER — Other Ambulatory Visit: Payer: Self-pay | Admitting: *Deleted

## 2024-06-28 VITALS — BP 126/72 | HR 72

## 2024-06-28 DIAGNOSIS — G4761 Periodic limb movement disorder: Secondary | ICD-10-CM

## 2024-06-28 DIAGNOSIS — R790 Abnormal level of blood mineral: Secondary | ICD-10-CM

## 2024-06-28 DIAGNOSIS — E1140 Type 2 diabetes mellitus with diabetic neuropathy, unspecified: Secondary | ICD-10-CM | POA: Diagnosis not present

## 2024-06-28 DIAGNOSIS — G2581 Restless legs syndrome: Secondary | ICD-10-CM

## 2024-06-28 DIAGNOSIS — G619 Inflammatory polyneuropathy, unspecified: Secondary | ICD-10-CM

## 2024-06-28 DIAGNOSIS — R251 Tremor, unspecified: Secondary | ICD-10-CM

## 2024-06-28 DIAGNOSIS — G629 Polyneuropathy, unspecified: Secondary | ICD-10-CM

## 2024-06-28 DIAGNOSIS — E538 Deficiency of other specified B group vitamins: Secondary | ICD-10-CM

## 2024-06-28 DIAGNOSIS — Z8639 Personal history of other endocrine, nutritional and metabolic disease: Secondary | ICD-10-CM | POA: Diagnosis not present

## 2024-06-28 DIAGNOSIS — G4719 Other hypersomnia: Secondary | ICD-10-CM

## 2024-06-29 ENCOUNTER — Ambulatory Visit: Admitting: Obstetrics and Gynecology

## 2024-06-29 ENCOUNTER — Ambulatory Visit: Payer: Self-pay | Admitting: Neurology

## 2024-06-29 ENCOUNTER — Encounter: Payer: Self-pay | Admitting: Obstetrics and Gynecology

## 2024-06-29 VITALS — BP 106/63 | HR 67

## 2024-06-29 DIAGNOSIS — N952 Postmenopausal atrophic vaginitis: Secondary | ICD-10-CM

## 2024-06-29 DIAGNOSIS — N3281 Overactive bladder: Secondary | ICD-10-CM

## 2024-06-29 LAB — IRON,TIBC AND FERRITIN PANEL
Ferritin: 82 ng/mL (ref 15–150)
Iron Saturation: 21 % (ref 15–55)
Iron: 61 ug/dL (ref 27–139)
Total Iron Binding Capacity: 292 ug/dL (ref 250–450)
UIBC: 231 ug/dL (ref 118–369)

## 2024-06-29 MED ORDER — TROSPIUM CHLORIDE ER 60 MG PO CP24: 1.0000 | ORAL_CAPSULE | Freq: Every day | ORAL | 5 refills | Status: DC

## 2024-06-29 MED ORDER — ESTRADIOL 0.1 MG/GM VA CREA
TOPICAL_CREAM | VAGINAL | 11 refills | Status: AC
Start: 2024-07-02 — End: ?

## 2024-06-29 NOTE — Assessment & Plan Note (Signed)
-   Start vaginal estrogen cream. We discussed the potential risks associated with hormone replacement and the fact that these risks are very low with vaginal estrogen use due to the very low systemic absorption rate of ~ 0.01% with a twice-week regimen. - Should also use a vaginal moisturizer such as coconut oil or vitamin E cream

## 2024-06-29 NOTE — Assessment & Plan Note (Signed)
-   will stop myrbetriq  and start Trospium  for OAB symptoms.  - Continue with PT exercises and dietary changes - If frequency is better controlled but still experiencing leakage, can discuss next time about more options for SUI.

## 2024-06-29 NOTE — Progress Notes (Signed)
 [facility] Return Visit  SUBJECTIVE  History of Present Illness: Lauren [name] is a 78 y.o. female seen in follow-up for mixed incontinence. Plan at last visit was to increased Myrbetriq  to 50mg  and she has been undergoing pelvic PT  Some days she has good days, other days she is having more frequency. She is not waking up more than once a night. She does notice that her pad is lightly wet throughout the day and she is not always aware of the leakage. Has one more treatment of pelvic PT.   She is drinking one caffeine free bottle of soda per day, otherwise drinking water . Has occasional coffee in AM.   She is experiencing some vaginal dryness. Has used estrogen cream in the past. Not currently using anything for moisture.    Past Medical History: Patient  has a past medical history of Anxiety, Arthritis, Atypical chest pain (06/25/2021), Back pain, Cancer (HCC), Cataract, Colitis, Coronary artery calcification (02/20/2020), Diabetes mellitus without complication (HCC), Edema (03/05/2021), Edema of both lower extremities, Endometriosis, Family history of adverse reaction to anesthesia, Family history of breast cancer, Family history of colon cancer, Family history of kidney cancer, Family history of prostate cancer, Fatty liver, GERD (gastroesophageal reflux disease), History of hiatal hernia, History of kidney stones, History of ulcerative colitis, Hypertension, Insomnia, Joint pain, Kidney problem, Lower extremity edema (03/05/2021), Lumbar disc disease, Neuropathy, Osteoarthritis, Other hyperlipidemia, Palpitations, Personal history of radiation therapy, Pneumonia, Pure hypercholesterolemia (02/20/2020), PVCs (premature ventricular contractions), Umbilical hernia, Vitamin D  deficiency, and Wears glasses.   Past Surgical History: She  has a past surgical history that includes Colon surgery; Wrist surgery; Myomectomy; kidney stone removed (12/2009); Right colectomy; Tendon  release; RIGHT URETEROSCOPIC STONE EXTRACTION (12-11-2009  DR NORLEEN SELTZER); LEFT THUMB CARPOMETACARPAL JOINT SUSPENSIONPLASTY (04-06-2006  DR SISSY); EXCISION RIGHT BREAST MASS  (10-20-2005  DR MAUDE SALT); LEFT WRIST ARTHROSCOPY W/ DEBRIDEMENT AND REMOVAL CYST (03-26-2004  DR SISSY); Cholecystectomy (1992); Breast biopsy (08-13-2009  DR TSUEI); Colon resection (1986); Esophageal manometry (N/A, 06/11/2013); Knee arthroscopy (Left); Esophagogastroduodenoscopy (egd) with propofol  (N/A, 05/31/2016); Total hip arthroplasty (Right, 11/26/2016); Total knee arthroplasty (Left, 12/02/2017); Breast excisional biopsy (Left); Breast excisional biopsy (Right); Tonsillectomy; Appendectomy; Fracture surgery; Radioactive seed guided mastectomy with axillary sentinel lymph node biopsy (Right, 11/16/2018); Breast lumpectomy (Right, 11/16/2018); Eye surgery (Bilateral, 2023); Total knee arthroplasty (Right, 08/03/2022); and Total hip arthroplasty (Left, 12/06/2023).   Medications: She has a current medication list which includes the following prescription(s): aspirin  ec, b-complex with vitamin c, escitalopram , [START ON 07/02/2024] estradiol , fiber, fluticasone , gabapentin , hydrocodone -acetaminophen , onetouch delica plus lancet33g, magnesium , methocarbamol , onetouch verio, pantoprazole , phenylephrine , polyethylene glycol powder, rosuvastatin , tirzepatide , and trospium  chloride.   Allergies: Patient is allergic to ciprofloxacin hcl, percocet [oxycodone -acetaminophen ], penicillins, sulfa antibiotics, tape, tetanus toxoids, and jardiance [empagliflozin].   Social History: Patient  reports that she quit smoking about 41 years ago. Her smoking use included cigarettes. She started smoking about 51 years ago. She has a 10 pack-year smoking history. She quit smokeless tobacco use about 42 years ago. She reports that she does not drink alcohol and does not use drugs.     OBJECTIVE     Physical Exam: Vitals:   06/29/24 1015   BP: 106/63  Pulse: 67    Gen: No apparent distress, A&O x 3.  Detailed Urogynecologic Evaluation:  Deferred.    ASSESSMENT AND PLAN    Ms. Cowper is a 78 y.o. with:  1. Overactive bladder   2. Vaginal atrophy  Overactive bladder Assessment & Plan: - will stop myrbetriq  and start Trospium  for OAB symptoms.  - Continue with PT exercises and dietary changes - If frequency is better controlled but still experiencing leakage, can discuss next time about more options for SUI.   Orders: -     Trospium  Chloride ER; Take 1 capsule (60 mg total) by mouth daily.  Dispense: 30 capsule; Refill: 5  Vaginal atrophy Assessment & Plan: - Start vaginal estrogen cream. We discussed the potential risks associated with hormone replacement and the fact that these risks are very low with vaginal estrogen use due to the very low systemic absorption rate of ~ 0.01% with a twice-week regimen. - Should also use a vaginal moisturizer such as coconut oil or vitamin E cream   Orders: -     Estradiol ; Place 0.5g nightly for two weeks then twice a week after  Dispense: 42.5 g; Refill: 11  Return 6 weeks  Rosaline LOISE Caper, MD

## 2024-06-29 NOTE — Patient Instructions (Addendum)
Start vaginal estrogen therapy nightly for two weeks then 2 times weekly at night for treatment of vaginal atrophy (dryness of the vaginal tissues).  Please let us know if the prescription is too expensive and we can look for alternative options.  ° ° °Vulvovaginal moisturizer Options: °Vitamin E oil (pump or capsule) or cream (Gene's Vit E Cream) °Coconut oil °Silicone-based lubricant for use during intercourse ("wet platinum" is a brand available at most drugstores) °[place]risco °Consider the ingredients of the product - the fewer the ingredients the better! ° °Directions for Use: °Clean and dry your hands °Gently dab the vulvar/vaginal area dry as needed °Apply a “pea-sized” amount of the moisturizer onto your fingertip °Using you other hand, open the labia  °Apply the moisturizer to the vulvar/vaginal tissues °Wear loose fitting underwear/clothing if possible following application °Use moisturize up to 3 times daily as desired. ° °

## 2024-07-02 NOTE — Procedures (Signed)
       Full Name: Zabdi Mis Gender: Female MRN #: [id number] Date of Birth: May 04, 1946    Visit Date: 06/28/2024 08:18 Age: 78 Years  History: Lower extremity evaluation for neuropathy or radiculopathy as cause for leg symptoms vs restless leg syndrome  Summary: emg/ncs was performed on the lower extremity. Left superficial sebsory nerve showed no response. All remaining nerves (as indicated in the following tables) were within normal limits.  All muscles (as indicated in the following tables) were within normal limits.      Conclusion: This is a normal exam of the left lower extremity. No evidence for peripheral polyneuropathy or lumbar radiculopathy. The absence of peroneal sensory conduction can be seen normally in aging.   [name], M.D.  [facility] [address], [address] [place], [place] [address] Tel: [phone] Fax: [phone]  Verbal informed consent was obtained from the patient, patient was informed of potential risk of procedure, including bruising, bleeding, hematoma formation, infection, muscle weakness, muscle pain, numbness, among others.        MNC    Nerve / Sites Muscle Latency Ref. Amplitude Ref. Rel Amp Segments Distance Velocity Ref. Area    ms ms mV mV %  cm m/s m/s mVms  L Peroneal - EDB     Ankle EDB 4.9 <=6.5 2.0 >=2.0 100 Ankle - EDB 5   8.4     Fib head EDB 12.2  2.1  102 Fib head - Ankle 31 44 >=44 8.7     Pop fossa EDB 14.5  1.9  93.6 Pop fossa - Fib head 10 45 >=44 8.3         Pop fossa - Ankle      L Tibial - AH     Ankle AH 3.3 <=5.8 6.2 >=4.0 100 Ankle - AH 9   28.8     Pop fossa AH 12.8  3.1  49.2 Pop fossa - Ankle 42 44 >=41 15.3         SNC    Nerve / Sites Rec. Site Peak Lat Ref.  Amp Ref. Segments Distance    ms ms V V  cm  L Sural - Ankle (Calf)     Calf Ankle 3.9 <=4.4 6 >=6 Calf - Ankle 14  L Superficial peroneal - Ankle     Lat leg Ankle NR <=4.4 NR >=6 Lat leg - Ankle 14         F  Wave     Nerve F Lat Ref.   ms ms  L Tibial - AH 52.6 <=56.0       EMG Summary Table    Spontaneous MUAP Recruitment  Muscle IA Fib PSW Fasc Other Amp Dur. Poly Pattern  L. Vastus medialis Normal None None None _______ Normal Normal Normal Normal  L. Tibialis anterior Normal None None None _______ Normal Normal Normal Normal  L. Gastrocnemius (Medial head) Normal None None None _______ Normal Normal Normal Normal  L. Biceps femoris (long head) Normal None None None _______ Normal Normal Normal Normal  L. Gluteus maximus Normal None None None _______ Normal Normal Normal Normal  L. Gluteus medius Normal None None None _______ Normal Normal Normal Normal  L. Lumbar paraspinals (low) Normal None None None _______ Normal Normal Normal Normal

## 2024-07-02 NOTE — Progress Notes (Signed)
 History: patient is here with restless les; emg/ncs to evaluate for possible peripheral polyneuropathy vs radiculopathy as a cause of leg symptoms thought to be RLS.  I discussed findings. Emg/ncs did not show evidence for peripheral polyneuropathy/lumbar radiculopathy. She has no significant sensory disturbance in the toes feet except the feeling of having to move them constantly. She is on gabapentin . Her ferritin has increased to 86 with iron supplementation. Advised to continue. I reached out to sleep team to see if they would like her to take her at bedtime gabapentin  prior to sleep exam 07/12/2024, will contact patient with answer. Daughter in the room as well providig much information.  Orders Placed This Encounter  Procedures   Iron, TIBC and Ferritin Panel   Iron, TIBC and Ferritin Panel  ' I spent 10 minutes of face-to-face and non-face-to-face time with patient on the  1. RLS (restless legs syndrome)   2. Type 2 diabetes mellitus with diabetic neuropathy, without long-term current use of insulin  (HCC)   3. History of iron deficiency   4. Low ferritin   5. Peripheral polyneuropathy    diagnosis.  This included previsit chart review, lab review, study review, order entry, electronic health record documentation, patient education on the different diagnostic and therapeutic options, counseling and coordination of care, risks and benefits of management, compliance, or risk factor reduction. This does not include time spent on emg/ncs

## 2024-07-03 ENCOUNTER — Ambulatory Visit

## 2024-07-05 ENCOUNTER — Ambulatory Visit: Payer: PPO | Admitting: Hematology and Oncology

## 2024-07-05 ENCOUNTER — Ambulatory Visit: Attending: Orthopedic Surgery

## 2024-07-05 DIAGNOSIS — M25552 Pain in left hip: Secondary | ICD-10-CM | POA: Diagnosis not present

## 2024-07-05 DIAGNOSIS — M6281 Muscle weakness (generalized): Secondary | ICD-10-CM | POA: Diagnosis not present

## 2024-07-05 DIAGNOSIS — R293 Abnormal posture: Secondary | ICD-10-CM | POA: Insufficient documentation

## 2024-07-05 DIAGNOSIS — M25551 Pain in right hip: Secondary | ICD-10-CM | POA: Diagnosis not present

## 2024-07-05 DIAGNOSIS — M62838 Other muscle spasm: Secondary | ICD-10-CM | POA: Diagnosis not present

## 2024-07-05 DIAGNOSIS — M5459 Other low back pain: Secondary | ICD-10-CM | POA: Diagnosis not present

## 2024-07-05 DIAGNOSIS — R279 Unspecified lack of coordination: Secondary | ICD-10-CM | POA: Diagnosis not present

## 2024-07-05 NOTE — Therapy (Signed)
 OUTPATIENT PHYSICAL THERAPY FEMALE PELVIC TREATMENT   Patient Name: Lauren [name] MRN: [id number] DOB:1946/01/01, 78 y.o., female Today's Date: 07/05/2024  END OF SESSION:  PT End of Session - 07/05/24 1234     Visit Number 8    Date for PT Re-Evaluation 09/27/24    Authorization Type [employer] $10 copay    Progress Note Due on Visit 10    PT Start Time 1230    PT Stop Time 1309    PT Time Calculation (min) 39 min    Activity Tolerance Patient tolerated treatment well    Behavior During Therapy [facility] for tasks assessed/performed             Past Medical History:  Diagnosis Date   Anxiety    Arthritis    Back, Hip- right   Atypical chest pain 06/25/2021   Back pain    Cancer (HCC)    right breast   Cataract    Colitis    Coronary artery calcification 02/20/2020   Diabetes mellitus without complication (HCC)    Type II   Edema 03/05/2021   Edema of both lower extremities    Endometriosis    Family history of adverse reaction to anesthesia    Father - N/V - blood pressure; daughter N/V   Family history of breast cancer    Family history of colon cancer    Family history of kidney cancer    Family history of prostate cancer    Fatty liver    GERD (gastroesophageal reflux disease)    History of hiatal hernia    History of kidney stones    History of ulcerative colitis    Hypertension    Insomnia    Joint pain    Kidney problem    Lower extremity edema 03/05/2021   Lumbar disc disease    L4 L5   Neuropathy    Osteoarthritis    Other hyperlipidemia    Palpitations    Personal history of radiation therapy    Pneumonia    1983, 2003   Pure hypercholesterolemia 02/20/2020   PVCs (premature ventricular contractions)    benign   Umbilical hernia    Vitamin D  deficiency    Wears glasses    Past Surgical History:  Procedure Laterality Date   APPENDECTOMY     BREAST BIOPSY  08-13-2009  DR TSUEI   EXCISION LEFT NIPPLE DUCT   BREAST EXCISIONAL  BIOPSY Left    x2   BREAST EXCISIONAL BIOPSY Right    BREAST LUMPECTOMY Right 11/16/2018   invasive ductal   CHOLECYSTECTOMY  1992   COLON RESECTION  1986   ULCERATIVE COLITIS   COLON SURGERY     ESOPHAGEAL MANOMETRY N/A 06/11/2013   Procedure: ESOPHAGEAL MANOMETRY (EM);  Surgeon: Oliva FORBES Boots, MD;  Location: [facility] ENDOSCOPY;  Service: Endoscopy;  Laterality: N/A;   ESOPHAGOGASTRODUODENOSCOPY (EGD) WITH PROPOFOL  N/A 05/31/2016   Procedure: ESOPHAGOGASTRODUODENOSCOPY (EGD) WITH PROPOFOL ;  Surgeon: Oliva Boots, MD;  Location: [facility] ENDOSCOPY;  Service: Endoscopy;  Laterality: N/A;   EXCISION RIGHT BREAST MASS   10-20-2005  DR MAUDE YOUNG   EYE SURGERY Bilateral 2023   cataract   FRACTURE SURGERY     left arm   kidney stone removed  12/2009   KNEE ARTHROSCOPY Left    MCL   LEFT THUMB CARPOMETACARPAL JOINT SUSPENSIONPLASTY  04-06-2006  DR SISSY   LEFT WRIST ARTHROSCOPY W/ DEBRIDEMENT AND REMOVAL CYST  03-26-2004  DR SISSY   MYOMECTOMY  RADIOACTIVE SEED GUIDED PARTIAL MASTECTOMY WITH AXILLARY SENTINEL LYMPH NODE BIOPSY Right 11/16/2018   Procedure: RIGHT BREAST RADIOACTIVE SEED GUIDED PARTIAL MASTECTOMY WITH  SENTINEL  NODE BIOPSY;  Surgeon: Vernetta Berg, MD;  Location: [facility] OR;  Service: General;  Laterality: Right;   RIGHT COLECTOMY     RIGHT URETEROSCOPIC STONE EXTRACTION  12-11-2009  DR [facility]   TENDON RELEASE     TONSILLECTOMY     TOTAL HIP ARTHROPLASTY Right 11/26/2016   Procedure: RIGHT TOTAL HIP ARTHROPLASTY ANTERIOR APPROACH;  Surgeon: Lonni CINDERELLA Vernetta, MD;  Location: [facility] ORS;  Service: Orthopedics;  Laterality: Right;   TOTAL HIP ARTHROPLASTY Left 12/06/2023   Procedure: LEFT TOTAL HIP ARTHROPLASTY ANTERIOR APPROACH;  Surgeon: Vernetta Lonni CINDERELLA, MD;  Location: [facility] OR;  Service: Orthopedics;  Laterality: Left;   TOTAL KNEE ARTHROPLASTY Left 12/02/2017   Procedure: LEFT TOTAL KNEE ARTHROPLASTY;  Surgeon: Vernetta Lonni CINDERELLA, MD;  Location: [facility] ORS;  Service:  Orthopedics;  Laterality: Left;   TOTAL KNEE ARTHROPLASTY Right 08/03/2022   Procedure: RIGHT TOTAL KNEE ARTHROPLASTY;  Surgeon: Vernetta Lonni CINDERELLA, MD;  Location: [facility] OR;  Service: Orthopedics;  Laterality: Right;   WRIST SURGERY     both   Patient Active Problem List   Diagnosis Date Noted   Vaginal atrophy 06/29/2024   History of iron deficiency 05/24/2024   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   Falls 05/01/2024   Iron deficiency 05/01/2024   Overactive bladder 02/22/2024   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/2023   SUI (stress urinary incontinence, female) 11/22/2023   RLS (restless legs syndrome) 09/22/2023   Other hyperlipidemia 06/08/2023   Unilateral primary osteoarthritis, left hip 05/11/2023   Depression 03/16/2023   Allergic rhinitis 12/29/2022   Seasonal affective disorder (HCC) 12/01/2022   BMI 32.0-32.9,adult 12/01/2022   Obesity, Beginning BMI 37.59 12/01/2022   OA (osteoarthritis) of knee 08/03/2022   Status post total right knee replacement 08/03/2022   Stress 07/19/2022   Other constipation 07/19/2022   Atypical chest pain 06/25/2021   Class 2 severe obesity with serious comorbidity and body mass index (BMI) of 37.0 to 37.9 in adult (HCC) 06/03/2021   Vitamin D  deficiency 06/03/2021   Other fatigue 04/22/2021   SOB (shortness of breath) on exertion 04/22/2021   Lower extremity edema 03/05/2021   Coronary artery calcification 02/20/2020   Pure hypercholesterolemia 02/20/2020   Unilateral primary osteoarthritis, right knee 01/03/2019   Genetic testing 12/07/2018   Family history of breast cancer    Family history of prostate cancer    Family history of colon cancer    Family history of kidney cancer    Malignant neoplasm of upper-outer quadrant of right breast in female, 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/09/2018   Diabetic neuropathy, type II diabetes mellitus (HCC) 11/09/2018   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/02/2017    Trochanteric bursitis, right hip 05/10/2017   Status post total replacement of right hip 11/26/2016   DDD (degenerative disc disease), 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   Low back pain 04/23/2015   Hiatal hernia 04/13/2013   Diabetes mellitus (HCC) 01/26/2013   Umbilical hernia 07/24/2012   Essential hypertension     PCP: Okey Carlin Redbird, MD  REFERRING PROVIDER: Marilynne Rosaline SAILOR, MD   REFERRING DIAG: N32.81 (ICD-10-CM) - Overactive bladder N39.3 (ICD-10-CM) - SUI (stress urinary incontinence, female)  THERAPY DIAG:  Other muscle spasm  Muscle weakness (generalized)  Unspecified lack of coordination  Abnormal posture  Pain in left hip  Pain in right  hip  Other low back pain  Rationale for Evaluation and Treatment: Rehabilitation   ONSET DATE: 1 year  SUBJECTIVE:                                                                                                                                                                                           SUBJECTIVE STATEMENT: Pt has been to see Dr. Marilynne and has been put on estrogen cream. She has been switched from Myrbetriq  to Trospium  Chloride. She continues to report that she has good days and bad days with urinary urgency. She is trying to use the urge drill at night to help improve sleep. She still never knows when she is going to leak when she bends over, and sometimes she does not feel when this happens. She is only getting up 1x/night. She reports pain was much better in Rt hip after last session. She feels like low back pain is a small amount better than it has been; pain management doctor has been able to cut back medication without increase in pain.    PAIN: 07/05/24/25 Are you having pain? Yes NPRS scale: 2/10 Pain location: low back pain; Lt hip soreness; Rt iliopsoas issue   Pain type: aching Pain description: intermittent   Aggravating factors: lifting Rt LE Relieving factors: pain medication 3x/day (would love  to get off of)  PRECAUTIONS: None  RED FLAGS: None   WEIGHT BEARING RESTRICTIONS: No  FALLS:  Has patient fallen in last 6 months? Yes. Number of falls 1 fall, 3 weeks ago  OCCUPATION: Geophysical data processor   ACTIVITY LEVEL : not currently exercising   PLOF: Independent  PATIENT GOALS: to get better control over bladder  PERTINENT HISTORY:  Appendectomy, Rt breast lumpectomy, colon surgery, cholecystectomy, Lt knee arthroscopy to repair MCL, myomectomy, Rt partial mastectomy 2020, Rt colectomy, Rt THA 2018, Lt THA 2025, Lt TKA 2019, Rt TKA 2023, umbilical hernia  BOWEL MOVEMENT: Pain with bowel movement: No Type of bowel movement:Frequency 1-2x/day and Strain yes Fully empty rectum: Yes: mostly Leakage: No Pads: Yes: see below  Fiber supplement/laxative Yes - fiber gummies   URINATION: Pain with urination: No Fully empty bladder: No - not always, some post-void dribbling or she walks around with feeling that there is still a drop or two still there (almost feels like she needs to have an orgasm) Stream: WNL Urgency: has mostly gone away Frequency: usually no trips at night anymore; every 2 hours during the day  Fluid Intake:  Leakage: Coughing, Sneezing, Laughing, Lifting, and Bending forward Pads: Yes: usually, but not more than a spot or two  INTERCOURSE:  Not sexually active anymore - widow as of 3 years ago  PREGNANCY: Vaginal deliveries 2 Tearing No Episiotomy No C-section deliveries 0 Currently pregnant No  PROLAPSE: None   OBJECTIVE:  Note: Objective measures were completed at Evaluation unless otherwise noted. 07/05/24:               External Perineal Exam: red and dry                             Internal Pelvic Floor: no tenderness or pelvic floor muscle tone elevation; she did have reproduction of strong urgency with mobilization of urethra and bladder, Lt>Rt, with restriction present  Patient confirms identification and approves PT to assess  internal pelvic floor and treatment Yes  PELVIC MMT:   MMT 07/05/24  Vaginal 3/5, 6 second endurance, 7 repeat contraction  (Blank rows = not tested)        TONE: WNL  PROLAPSE: Grade 1 anterior vaginal wall laxity  EVAL: PATIENT SURVEYS:   PFIQ-7: 57  COGNITION: Overall cognitive status: Within functional limits for tasks assessed     SENSATION: Light touch: Appears intact   FUNCTIONAL TESTS:  Squat: bil UE support Single leg stance:  Rt: pelvic drop with UE support  Lt: pelvic drop with UE support Curl-up test: distortion   GAIT: Assistive device utilized: None Comments: bil trendelenburg   POSTURE: rounded shoulders, forward head, decreased lumbar lordosis, increased thoracic kyphosis, posterior pelvic tilt, and Rt posterior rotation   LUMBARAROM/PROM:  A/PROM A/PROM  Eval (% available)  Flexion 50  Extension 50  Right lateral flexion 50, pain  Left lateral flexion 50  Right rotation 50  Left rotation 50   (Blank rows = not tested)  PALPATION:   General: tightness throughout bil lumbar paraspinals; bil scar tissue restriction in hip incisions  Pelvic Alignment: Rt posterior rotation   Abdominal: apical breathing pattern, decreased rib cage mobility                 External Perineal Exam: red and dry                             Internal Pelvic Floor: no superficial discomfort, mild discomfort in Lt deep layers, significant tightness and pain in deep Rt pelvic floor/obturator/piriformis  Patient confirms identification and approves PT to assess internal pelvic floor and treatment Yes  PELVIC MMT:   MMT eval  Vaginal 3/5, 2 second endurance, 5 repeat contraction  Diastasis Recti 2-3 finger width separation   (Blank rows = not tested)        TONE: High, Rt>Lt  PROLAPSE: Grade 1 anterior vaginal wall laxity  TODAY'S TREATMENT:  DATE:  RE-EVAL Manual: Pt provides verbal consent for internal vaginal/rectal pelvic floor exam. Internal pelvic floor muscle reassessment  Internal bladder and urethral mobilization  Neuromuscular re-education: Internal pelvic floor muscle contraction training Quick flicks - working on faster engagement of muscles   06/27/24 Manual: Supine abdominal scar tissue mobilization  Supine bowel mobilization  Supine lower rib cage mobilization Supine Rt hip scar tissue mobilization Supine Rt hip soft tissue mobilization    06/25/24 Manual: Supine abdominal scar tissue mobilization  Supine bowel mobilization  Supine lower rib cage mobilization Neuromuscular re-education: Supine chin tucks/lift 2 x 10 with manual rib cage compression in order to help improve upper abdominal activation  Supine full shoulder flexion 3 lbs with manual rib cage compression in order to help improve upper abdominal activation 2 x 10 Supine bent knee fall out + core activation 10x bil Seated resisted rotation + pelvic floor muscle contraction green band 2 x 10    PATIENT EDUCATION:  Education details: See above Person educated: Patient Education method: Programmer, multimedia, Demonstration, Actor cues, Verbal cues, and Handouts Education comprehension: verbalized understanding  HOME EXERCISE PROGRAM: [id number]  ASSESSMENT:  CLINICAL IMPRESSION: Patient is a 78 y.o. female who was seen today for physical therapy evaluation and treatment for stress urinary incontinence, constipation, and low back pain. Pt is doing well with over 25% improvement in bil hip and low back pain. She is also seeing improvement in urinary urgency and nocturia, only waking 1x/night. She reports having good days and bad days. As we returned to pelvic floor muscle exam for reassessment of strength and coordination, she demonstrates several contractions before she was able to achieve full pelvic floor muscle strength. She could  indicate why she is still having difficulty with bending forward; her fast pelvic floor muscle engagement is not there and she requires several contractions to get good support. We will continue addressing this with progressing towards more standing functional activities. She will continue to benefit from skilled PT intervention in order to decrease stress urinary incontinence, improve constipation, decrease low back/hip pain, address impairments, and improve quality of life.   OBJECTIVE IMPAIRMENTS: decreased activity tolerance, decreased coordination, decreased endurance, decreased mobility, decreased ROM, decreased strength, increased fascial restrictions, increased muscle spasms, impaired flexibility, impaired tone, improper body mechanics, postural dysfunction, and pain.   ACTIVITY LIMITATIONS: lifting, bending, and continence  PARTICIPATION LIMITATIONS: cleaning, laundry, and community activity  PERSONAL FACTORS: 1-2 comorbidities: medical history are also affecting patient's functional outcome.   REHAB POTENTIAL: Good  CLINICAL DECISION MAKING: Evolving/moderate complexity  EVALUATION COMPLEXITY: Moderate   GOALS: Goals reviewed with patient? Yes  SHORT TERM GOALS: Updated 07/05/24   Pt will be independent with HEP.   Baseline: Goal status: MET 07/05/24  2.  Pt will decrease pain by 25% in order to be able to perform regular exercise program to decrease urinary incontinence and pain.  Baseline:  Goal status:MET 07/05/24  3.  Pt will have no tenderness in bil levator ani in order to be able to ambulate and move low back/pelvis in daily activities without pain.  Baseline:  Goal status: IN PROGRESS 07/05/24  4.  Pt will increase pelvic floor muscle strength to 4/5 in order to decrease urinary incontinence and pad use.  Baseline: 3/5 - slow engagement Goal status: IN PROGRESS 07/05/24  5.  Pt will be independent with the knack, urge suppression technique, and double voiding in order  to improve bladder habits and decrease urinary incontinence.   Baseline:  Goal status: MET  07/05/24  6.  Pt will be independent with use of squatty potty, relaxed toileting mechanics, and improved bowel movement techniques in order to increase ease of bowel movements and complete evacuation.   Baseline:  Goal status: MET 07/05/24  LONG TERM GOALS: Updated 07/05/24  Pt will be independent with advanced HEP.   Baseline:  Goal status: IN PROGRESS 07/05/24  2.  Pt will decrease pain by 75% in order to be able to perform regular exercise program to decrease urinary incontinence and pain.  Baseline:  Goal status: IN PROGRESS 07/05/24  3.  Pt will increase pelvic floor muscle endurance to greater than 10 seconds in order to decrease pad use to 0 a day.  Baseline:  Goal status: IN PROGRESS 07/05/24  4.  Pt will report no leaks with laughing, coughing, sneezing, bending, and lifting in order to improve comfort with interpersonal relationships and community activities.   Baseline:  Goal status: IN PROGRESS 07/05/24  5.  Pt will be able to lift 10lb kettle bell 5 times without pain or urinary incontinence.  Baseline:  Goal status: IN PROGRESS 07/05/24  6.  Pt will be able to stand on single LE without pelvic drop in order to improve fall risk, decrease low back pain, and help maintain progress with urinary incontinence.  Baseline:  Goal status: IN PROGRESS 07/05/24  PLAN:  PT FREQUENCY: 1-2x/week  PT DURATION: 12 weeks  PLANNED INTERVENTIONS: 97110-Therapeutic exercises, 97530- Therapeutic activity, 97112- Neuromuscular re-education, 97535- Self Care, [id number]- Manual therapy, Dry Needling, and Biofeedback  PLAN FOR NEXT SESSION: continue core strengthening; focus on improved pelvic floor muscle and core muscle engagement more quickly   Josette Mares, PT, DPT09/04/251:06 PM

## 2024-07-10 ENCOUNTER — Telehealth: Payer: Self-pay | Admitting: Neurology

## 2024-07-10 ENCOUNTER — Encounter (INDEPENDENT_AMBULATORY_CARE_PROVIDER_SITE_OTHER): Payer: Self-pay | Admitting: Family Medicine

## 2024-07-10 ENCOUNTER — Telehealth (INDEPENDENT_AMBULATORY_CARE_PROVIDER_SITE_OTHER): Admitting: Family Medicine

## 2024-07-10 VITALS — Ht 63.0 in | Wt 178.0 lb

## 2024-07-10 DIAGNOSIS — Z6831 Body mass index (BMI) 31.0-31.9, adult: Secondary | ICD-10-CM

## 2024-07-10 DIAGNOSIS — F3289 Other specified depressive episodes: Secondary | ICD-10-CM

## 2024-07-10 DIAGNOSIS — Z7985 Long-term (current) use of injectable non-insulin antidiabetic drugs: Secondary | ICD-10-CM

## 2024-07-10 DIAGNOSIS — J3089 Other allergic rhinitis: Secondary | ICD-10-CM

## 2024-07-10 DIAGNOSIS — E669 Obesity, unspecified: Secondary | ICD-10-CM | POA: Diagnosis not present

## 2024-07-10 DIAGNOSIS — F5089 Other specified eating disorder: Secondary | ICD-10-CM | POA: Diagnosis not present

## 2024-07-10 DIAGNOSIS — E119 Type 2 diabetes mellitus without complications: Secondary | ICD-10-CM | POA: Diagnosis not present

## 2024-07-10 DIAGNOSIS — E782 Mixed hyperlipidemia: Secondary | ICD-10-CM

## 2024-07-10 DIAGNOSIS — R296 Repeated falls: Secondary | ICD-10-CM | POA: Diagnosis not present

## 2024-07-10 DIAGNOSIS — E1140 Type 2 diabetes mellitus with diabetic neuropathy, unspecified: Secondary | ICD-10-CM

## 2024-07-10 MED ORDER — TIRZEPATIDE 7.5 MG/0.5ML ~~LOC~~ SOAJ: 7.5000 mg | SUBCUTANEOUS | 0 refills | Status: DC

## 2024-07-10 MED ORDER — FLUTICASONE PROPIONATE 50 MCG/ACT NA SUSP: 2.0000 | Freq: Every day | NASAL | 0 refills | Status: DC | PRN

## 2024-07-10 MED ORDER — ESCITALOPRAM OXALATE 20 MG PO TABS: 20.0000 mg | ORAL_TABLET | Freq: Every day | ORAL | 0 refills | Status: DC

## 2024-07-10 MED ORDER — ROSUVASTATIN CALCIUM 20 MG PO TABS: 20.0000 mg | ORAL_TABLET | Freq: Every day | ORAL | 0 refills | Status: DC

## 2024-07-10 NOTE — Progress Notes (Addendum)
 Office: [phone]  /  Fax: [phone]  WEIGHT SUMMARY AND BIOMETRICS  Anthropometric Measurements Height: 5' 3 (1.6 m) Weight: 178 lb (80.7 kg) (last ov) BMI (Calculated): 31.54 Weight at Last Visit: 178 lb Starting Weight: 219 lb Peak Weight: 222 lb   No data recorded Other Clinical Data Fasting: no Labs: no Today's Visit #: 39 Starting Date: 04/22/21 Comments: My chart Video Visit  Virtual Visit via A/V Note  I connected with Lauren [name] on 07/30/24 at  9:00 AM EDT by audiovisual telehealth and verified that I am speaking with the correct person using two identifiers.  Location: Patient: home Provider: clinic   I discussed the limitations, risks, security and privacy concerns of performing an evaluation and management service by AV telehealth and the availability of in person appointments. I also discussed with the patient that there may be a patient responsible charge related to this service. The patient expressed understanding and agreed to proceed.     Chief Complaint: OBESITY  History of Present Illness   Lauren [name] is a 78 year old female with obesity, hyperlipidemia, and type 2 diabetes who presents with a recent fall and associated pain.  She experienced a fall on Saturday while giving her dog a treat, resulting in pain and bruising on the same side as [place] [facility]. X-rays at that time did not show a fracture, but her pain management doctor suggested a possible small crack. She has had four falls this year, including one with an ankle injury from stepping in a hole in her backyard. She reports pain that makes it difficult to get in and out of bed or a seat. She uses a lift chair to assist with sitting and standing and reports difficulty with mobility, making her fearful of going to the grocery store.  She is on a category two immune plan for weight management, which she follows about fifty percent of  the time. Her weight has remained stable at 176 pounds since the last visit. She has been attending physical therapy twice a week for core strengthening and balance, but her sessions have concluded. She is also working on diet and exercise to manage her obesity, hyperlipidemia, and type 2 diabetes.  She is on Mounjaro  for diabetes management, Crestor  for hyperlipidemia, Lexapro  for emotional eating behaviors, and Flonase  for allergic rhinitis. She requests refills for these medications. She reports difficulty with her evening meal due to lack of appetite, which she attributes to Mounjaro , and is trying to adjust her diet accordingly.  Her daughter is involved in her care, particularly in ensuring she stays hydrated and has access to emergency communication devices. She is involved in church activities and has joined a Bible study group, but is concerned about her independence due to frequent falls. No significant caffeine intake.          PHYSICAL EXAM:  Height 5' 3 (1.6 m), weight 178 lb (80.7 kg). Body mass index is 31.53 kg/m.  DIAGNOSTIC DATA REVIEWED:  BMET    Component Value Date/Time   NA 140 05/24/2024 1032   K 3.5 05/24/2024 1032   CL 101 05/24/2024 1032   CO2 21 05/24/2024 1032   GLUCOSE 68 (L) 05/24/2024 1032   GLUCOSE 118 (H) 12/07/2023 0736   BUN 7 (L) 05/24/2024 1032   CREATININE 0.81 05/24/2024 1032   CREATININE 0.87 06/08/2022 1439   CALCIUM  9.3 05/24/2024 1032   GFRNONAA 60 (L) 12/07/2023 0736  GFRNONAA >60 06/08/2022 1439   GFRAA >60 02/04/2020 1212   GFRAA >60 11/09/2018 1405   Lab Results  Component Value Date   HGBA1C 6.0 (H) 06/08/2023   HGBA1C 6.7 (H) 11/16/2016   Lab Results  Component Value Date   INSULIN  25.6 (H) 06/08/2023   INSULIN  22.3 04/22/2021   Lab Results  Component Value Date   TSH 2.450 04/11/2024   CBC    Component Value Date/Time   WBC 10.9 (H) 11/30/2023 1347   RBC 4.25 11/30/2023 1347   HGB 12.4 11/30/2023 1347   HGB  12.3 06/08/2022 1439   HGB 11.8 05/11/2022 0954   HCT 37.5 11/30/2023 1347   HCT 35.1 05/11/2022 0954   PLT 327 11/30/2023 1347   PLT 272 06/08/2022 1439   PLT 265 05/11/2022 0954   MCV 88.2 11/30/2023 1347   MCV 87 05/11/2022 0954   MCH 29.2 11/30/2023 1347   MCHC 33.1 11/30/2023 1347   RDW 13.3 11/30/2023 1347   RDW 13.2 05/11/2022 0954   Iron Studies    Component Value Date/Time   IRON 61 06/28/2024 0918   TIBC 292 06/28/2024 0918   FERRITIN 82 06/28/2024 0918   IRONPCTSAT 21 06/28/2024 0918   Lipid Panel     Component Value Date/Time   CHOL 123 06/08/2023 0941   TRIG 75 06/08/2023 0941   HDL 67 06/08/2023 0941   LDLCALC 41 06/08/2023 0941   Hepatic Function Panel     Component Value Date/Time   PROT 6.6 05/24/2024 1032   ALBUMIN  4.3 05/24/2024 1032   AST 17 05/24/2024 1032   AST 16 06/08/2022 1439   ALT 12 05/24/2024 1032   ALT 12 06/08/2022 1439   ALKPHOS 72 05/24/2024 1032   BILITOT 0.4 05/24/2024 1032   BILITOT 0.4 06/08/2022 1439   BILIDIR <0.1 (L) 07/30/2015 1338   IBILI NOT CALCULATED 07/30/2015 1338      Component Value Date/Time   TSH 2.450 04/11/2024 1208   TSH 2.150 05/11/2022 0954   Nutritional Lab Results  Component Value Date   VD25OH 57.5 02/07/2024   VD25OH 124.0 (H) 09/22/2023   VD25OH 100.0 06/08/2023     Assessment and Plan    Obesity with BMI 31.0-31.9 Obesity management. Weight has been stable over the last month. She is on a category two immune plan, which she follows about fifty percent of the time. She is working on diet and exercise as part of her weight management strategy. - Continue category two eating plan - Encourage adherence to diet and exercise regimen - Work on not skipping meals - Increasing lean protein and strengthening exercise especially important  Type 2 diabetes mellitus Type 2 diabetes is being managed with diet, exercise, weight loss, and Mounjaro . She reports not being hungry in the evenings, which may  be attributed to Mounjaro . - Continue Mounjaro  - Encourage balanced meals and consider small, frequent meals if not hungry - Refill Mounjaro   Mixed hyperlipidemia Mixed hyperlipidemia is being managed with diet, exercise, weight loss, and Crestor . She is actively working on improving her cholesterol levels. - Continue Crestor  - Refill Crestor  - Continue diet, exercise and weight loss as discussed today as an important part of the treatment plan  Emotional eating behaviors She is being managed with Lexapro . She is actively working on reducing emotional eating behaviors. - Continue Lexapro  - Refill Lexapro   Recurrent falls Recurrent falls with four falls this year, including a recent fall causing pain and bruising. Concerns about balance and core  strength. She has completed physical therapy but is considering additional therapy for balance and core strengthening. There is a potential link between falls and volume depletion or medication side effects. She is considering using a smartwatch for emergency contact in case of falls. - Encourage hydration with at least 80 ounces of fluid daily - Contact primary care provider to discuss frequent falls and options for physical therapy - Consider use of a smartwatch for emergency contact in case of falls  Allergic rhinitis Allergic rhinitis is being managed with Flonase . She reports increased symptoms with the fall season. - Continue Flonase  - Use Flonase  nightly during fall season - Refill Flonase         Lauren [name] was informed of the importance of frequent follow up visits to maximize her success with intensive lifestyle modifications for her obesity and obesity related health conditions as recommended by USPSTF and CMS guidelines   Louann Penton, MD

## 2024-07-10 NOTE — Telephone Encounter (Signed)
 Patient has a sleep study 9/11. She had a question on her gabapentin . Would someone call and tell her that Dr. Chalice said to take the Gabapentin , she can take it to the lab if needed and take it there as it is ordered. Thank you !!!

## 2024-07-12 ENCOUNTER — Encounter

## 2024-07-23 ENCOUNTER — Encounter: Payer: Self-pay | Admitting: Orthopaedic Surgery

## 2024-07-23 ENCOUNTER — Ambulatory Visit (INDEPENDENT_AMBULATORY_CARE_PROVIDER_SITE_OTHER): Admitting: Orthopaedic Surgery

## 2024-07-23 DIAGNOSIS — G8929 Other chronic pain: Secondary | ICD-10-CM | POA: Diagnosis not present

## 2024-07-23 DIAGNOSIS — Z96642 Presence of left artificial hip joint: Secondary | ICD-10-CM | POA: Diagnosis not present

## 2024-07-23 DIAGNOSIS — M25551 Pain in right hip: Secondary | ICD-10-CM

## 2024-07-23 DIAGNOSIS — Z96641 Presence of right artificial hip joint: Secondary | ICD-10-CM | POA: Diagnosis not present

## 2024-07-23 NOTE — Progress Notes (Addendum)
 The patient is a 78 year old female well-known to me.  We have replaced both of her hips and both of her knees.  Her right hip was replaced back in 2018 and she continues to have some pain in the groin area of that hip.  She did have an injection 3 months ago in the psoas tendon under ultrasound by Dr. Burnetta.  She is interested in having that type of injection again.  She still describes groin pain.  X-rays of her right hip have shown a well-seated implant with no complicating features.  She does ambulate with a cane in her opposite hand.  On my exam today she still has full and fluid range of motion of the right hip with definitely some pain in the groin with rotation and flexion.  We talked about this being likely scar tissue that may benefit from surgical exploration.  She would like to at least try 1 more injection I think it is reasonable to consider an injection around the iliopsoas tendon or even consider 1 just around the joint capsule itself anteriorly.  I would like her to see Dr. Burnetta again for his expertise in an injection such as this under ultrasound guidance and then potentially a referral to any type of therapist they can specialize in soft tissue treatments around this area.  Our last option would be considering surgical intervention.  When she sees Dr. Burnetta I would like him to get her back to us  about a month later.

## 2024-07-24 DIAGNOSIS — Z6831 Body mass index (BMI) 31.0-31.9, adult: Secondary | ICD-10-CM | POA: Diagnosis not present

## 2024-07-24 DIAGNOSIS — S20219A Contusion of unspecified front wall of thorax, initial encounter: Secondary | ICD-10-CM | POA: Diagnosis not present

## 2024-07-24 DIAGNOSIS — R42 Dizziness and giddiness: Secondary | ICD-10-CM | POA: Diagnosis not present

## 2024-07-24 DIAGNOSIS — R296 Repeated falls: Secondary | ICD-10-CM | POA: Diagnosis not present

## 2024-07-25 ENCOUNTER — Encounter: Payer: Self-pay | Admitting: Obstetrics and Gynecology

## 2024-07-31 DIAGNOSIS — G2581 Restless legs syndrome: Secondary | ICD-10-CM | POA: Diagnosis not present

## 2024-07-31 DIAGNOSIS — M25552 Pain in left hip: Secondary | ICD-10-CM | POA: Diagnosis not present

## 2024-07-31 DIAGNOSIS — M25551 Pain in right hip: Secondary | ICD-10-CM | POA: Diagnosis not present

## 2024-07-31 DIAGNOSIS — M47817 Spondylosis without myelopathy or radiculopathy, lumbosacral region: Secondary | ICD-10-CM | POA: Diagnosis not present

## 2024-08-02 ENCOUNTER — Ambulatory Visit (INDEPENDENT_AMBULATORY_CARE_PROVIDER_SITE_OTHER): Admitting: Sports Medicine

## 2024-08-02 ENCOUNTER — Other Ambulatory Visit: Payer: Self-pay

## 2024-08-02 DIAGNOSIS — M25552 Pain in left hip: Secondary | ICD-10-CM

## 2024-08-02 DIAGNOSIS — Z96641 Presence of right artificial hip joint: Secondary | ICD-10-CM

## 2024-08-02 DIAGNOSIS — R2689 Other abnormalities of gait and mobility: Secondary | ICD-10-CM

## 2024-08-02 DIAGNOSIS — G8929 Other chronic pain: Secondary | ICD-10-CM

## 2024-08-02 DIAGNOSIS — M7611 Psoas tendinitis, right hip: Secondary | ICD-10-CM

## 2024-08-02 DIAGNOSIS — M25551 Pain in right hip: Secondary | ICD-10-CM

## 2024-08-02 NOTE — Progress Notes (Unsigned)
 Patient says that she did pretty well with the last injection. She has been having anterior hip pain, as well as soreness over the lateral aspect of both hips. She is here today for repeat injection for the right side.

## 2024-08-02 NOTE — Progress Notes (Unsigned)
 COBIE LEIDNER - 78 y.o. female MRN [id number]  Date of birth: [birth year]  Office Visit Note: Visit Date: 08/02/2024 PCP: Okey Carlin Redbird, MD Referred by: Okey Carlin Redbird, MD  Subjective: Chief Complaint  Patient presents with  . Right Hip - Pain   HPI: GIFT RUECKERT is a pleasant 78 y.o. female who presents today for ***  Pertinent ROS were reviewed with the patient and found to be negative unless otherwise specified above in HPI.   Assessment & Plan: Visit Diagnoses:  1. Psoas tendinitis of right side     Plan: ***  Follow-up: No follow-ups on file.   Meds & Orders: No orders of the defined types were placed in this encounter.   Orders Placed This Encounter  Procedures  . US  Guided Needle Placement - No Linked Charges     Procedures: US -guided Iliopsoas Tendon Sheath Injection, Right Hip: After discussion on risk/benefits/indications, an informed verbal consent was obtained. A timeout was then performed. The patient was lying supine on examination table with the affected leg relaxed in neutral position. The area overlying the groin and psoas tendon was prepped with ChloraPrep and multiple alcohol swabs. The ultrasound probe was placed in an oblique plane parallel to the inguinal ligament and superior to femoral head. The overlying soft tissue was anesthesized with 3cc of lidocaine  1%. Using ultrasound guidance via an in-plane approach, a 22-gauge, 3.5 needle was inserted from a lateral to medial direction into the iliopsoas tendon sheath between the tendon and ilium. The tendon sheath was then injected with a mixture of 2:2:1cc of lidocaine :bupivicaine:celestone . Appropriate spread of the injectate within the tendon sheath was visualized with ultrasound guidance. Patient tolerated the procedure well without immediate complications.  A Band-Aid was then applied.         Clinical History: No specialty comments available.  She reports that she quit smoking  about 41 years ago. Her smoking use included cigarettes. She started smoking about 51 years ago. She has a 10 pack-year smoking history. She quit smokeless tobacco use about 42 years ago. No results for input(s): HGBA1C, LABURIC in the last 8760 hours.  Objective:   Vital Signs: There were no vitals taken for this visit.  Physical Exam  Gen: Well-appearing, in no acute distress; non-toxic CV: Well-perfused. Warm.  Resp: Breathing unlabored on room air; no wheezing. Psych: Fluid speech in conversation; appropriate affect; normal thought process  Ortho Exam - ***  Imaging: No results found.  Past Medical/Family/Surgical/Social History: Medications & Allergies reviewed per EMR, new medications updated. Patient Active Problem List   Diagnosis Date Noted  . Vaginal atrophy 06/29/2024  . History of iron deficiency 05/24/2024  . 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 Falls 05/01/2024  . Iron deficiency 05/01/2024  . Overactive bladder 02/22/2024  . 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 SUI (stress urinary incontinence, female) 11/22/2023  . RLS (restless legs syndrome) 09/22/2023  . Other hyperlipidemia 06/08/2023  . Unilateral primary osteoarthritis, left hip 05/11/2023  . Depression 03/16/2023  . Allergic rhinitis 12/29/2022  . Seasonal affective disorder 12/01/2022  . BMI 32.0-32.9,adult 12/01/2022  . Obesity, Beginning BMI 37.59 12/01/2022  . OA (osteoarthritis) of knee 08/03/2022  . Status post total right knee replacement 08/03/2022  . Stress 07/19/2022  . Other constipation 07/19/2022  . Atypical chest pain 06/25/2021  . Class 2 severe obesity with serious comorbidity and body mass index (BMI) of 37.0 to 37.9 in adult 06/03/2021  . Vitamin D  deficiency 06/03/2021  .  Other fatigue 04/22/2021  . SOB (shortness of breath) on exertion 04/22/2021  . Lower extremity edema 03/05/2021  . Coronary artery calcification 02/20/2020  . Pure hypercholesterolemia  02/20/2020  . Unilateral primary osteoarthritis, right knee 01/03/2019  . Genetic testing 12/07/2018  . Family history of breast cancer   . Family history of prostate cancer   . Family history of colon cancer   . Family history of kidney cancer   . Malignant neoplasm of upper-outer quadrant of right breast in female, 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/09/2018  . Diabetic neuropathy, type II diabetes mellitus (HCC) 11/09/2018  . 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/02/2017  . Trochanteric bursitis, right hip 05/10/2017  . Status post total replacement of right hip 11/26/2016  . DDD (degenerative disc disease), 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  . Low back pain 04/23/2015  . Hiatal hernia 04/13/2013  . Diabetes mellitus (HCC) 01/26/2013  . Umbilical hernia 07/24/2012  . Essential hypertension    Past Medical History:  Diagnosis Date  . Anxiety   . Arthritis    Back, Hip- right  . Atypical chest pain 06/25/2021  . Back pain   . Cancer (HCC)    right breast  . Cataract   . Colitis   . Coronary artery calcification 02/20/2020  . Diabetes mellitus without complication (HCC)    Type II  . Edema 03/05/2021  . Edema of both lower extremities   . Endometriosis   . Family history of adverse reaction to anesthesia    Father - N/V - blood pressure; daughter N/V  . Family history of breast cancer   . Family history of colon cancer   . Family history of kidney cancer   . Family history of prostate cancer   . Fatty liver   . GERD (gastroesophageal reflux disease)   . History of hiatal hernia   . History of kidney stones   . History of ulcerative colitis   . Hypertension   . Insomnia   . Joint pain   . Kidney problem   . Lower extremity edema 03/05/2021  . 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 Neuropathy   . Osteoarthritis   . Other hyperlipidemia   . Palpitations   . Personal history of radiation therapy   . Pneumonia    1983, 2003  . Pure hypercholesterolemia 02/20/2020  . PVCs  (premature ventricular contractions)    benign  . Umbilical hernia   . Vitamin D  deficiency   . Wears glasses    Family History  Problem Relation Age of Onset  . Hyperlipidemia Mother   . Diabetes Mother   . Hypertension Mother   . Parkinson's disease Mother   . Kidney disease Mother   . Kidney disease Father   . Stroke Father   . Heart failure Father   . Hypertension Father   . Heart disease Father   . Diabetes Father   . Cancer Father        colon, prostate, and kidney  . Restless legs syndrome Father   . Cancer Maternal Aunt        cervical vs ovarian  . Colon cancer Paternal Aunt        dx less than 50  . Breast cancer Paternal Aunt        dx in her 41s  . Prostate cancer Paternal Uncle   . Heart disease Maternal Grandmother   . Diabetes Maternal Grandmother   . Heart disease Maternal Grandfather   .  Diabetes Maternal Grandfather   . Cancer Maternal Grandfather        pt unaware of what kind  . Stroke Paternal Grandmother   . Stroke Paternal Grandfather   . Breast cancer Cousin        pat first cousin, dx over 14  . Prostate cancer Other        PGF's father   Past Surgical History:  Procedure Laterality Date  . APPENDECTOMY    . BREAST BIOPSY  08-13-2009  DR TSUEI   EXCISION LEFT NIPPLE DUCT  . BREAST EXCISIONAL BIOPSY Left    x2  . BREAST EXCISIONAL BIOPSY Right   . BREAST LUMPECTOMY Right 11/16/2018   invasive ductal  . CHOLECYSTECTOMY  1992  . COLON RESECTION  1986   ULCERATIVE COLITIS  . COLON SURGERY    . ESOPHAGEAL MANOMETRY N/A 06/11/2013   Procedure: ESOPHAGEAL MANOMETRY (EM);  Surgeon: Oliva FORBES Boots, MD;  Location: [facility];  Service: Endoscopy;  Laterality: N/A;  . ESOPHAGOGASTRODUODENOSCOPY (EGD) WITH PROPOFOL  N/A 05/31/2016   Procedure: ESOPHAGOGASTRODUODENOSCOPY (EGD) WITH PROPOFOL ;  Surgeon: Oliva Boots, MD;  Location: [facility] ENDOSCOPY;  Service: Endoscopy;  Laterality: N/A;  . EXCISION RIGHT BREAST MASS   10-20-2005  DR MAUDE YOUNG  . EYE  SURGERY Bilateral 2023   cataract  . FRACTURE SURGERY     left arm  . kidney stone removed  12/2009  . KNEE ARTHROSCOPY Left    MCL  . LEFT THUMB CARPOMETACARPAL JOINT SUSPENSIONPLASTY  04-06-2006  DR SISSY  . LEFT WRIST ARTHROSCOPY W/ DEBRIDEMENT AND REMOVAL CYST  03-26-2004  DR SISSY  . MYOMECTOMY    . RADIOACTIVE SEED GUIDED PARTIAL MASTECTOMY WITH AXILLARY SENTINEL LYMPH NODE BIOPSY Right 11/16/2018   Procedure: RIGHT BREAST RADIOACTIVE SEED GUIDED PARTIAL MASTECTOMY WITH  SENTINEL  NODE BIOPSY;  Surgeon: Vernetta Berg, MD;  Location: [facility] OR;  Service: General;  Laterality: Right;  . RIGHT COLECTOMY    . RIGHT URETEROSCOPIC STONE EXTRACTION  12-11-2009  DR NORLEEN SELTZER  . TENDON RELEASE    . TONSILLECTOMY    . TOTAL HIP ARTHROPLASTY Right 11/26/2016   Procedure: RIGHT TOTAL HIP ARTHROPLASTY ANTERIOR APPROACH;  Surgeon: Lonni CINDERELLA Vernetta, MD;  Location: [facility] ORS;  Service: Orthopedics;  Laterality: Right;  . TOTAL HIP ARTHROPLASTY Left 12/06/2023   Procedure: LEFT TOTAL HIP ARTHROPLASTY ANTERIOR APPROACH;  Surgeon: Vernetta Lonni CINDERELLA, MD;  Location: [facility] OR;  Service: Orthopedics;  Laterality: Left;  . TOTAL KNEE ARTHROPLASTY Left 12/02/2017   Procedure: LEFT TOTAL KNEE ARTHROPLASTY;  Surgeon: Vernetta Lonni CINDERELLA, MD;  Location: [facility];  Service: Orthopedics;  Laterality: Left;  . TOTAL KNEE ARTHROPLASTY Right 08/03/2022   Procedure: RIGHT TOTAL KNEE ARTHROPLASTY;  Surgeon: Vernetta Lonni CINDERELLA, MD;  Location: [facility] OR;  Service: Orthopedics;  Laterality: Right;  . WRIST SURGERY     both   Social History   Occupational History  . Occupation: Retired  Tobacco Use  . Smoking status: Former    Current packs/day: 0.00    Average packs/day: 1 pack/day for 10.0 years (10.0 ttl pk-yrs)    Types: Cigarettes    Start date: 09/22/1972    Quit date: 09/22/1982    Years since quitting: 41.8  . Smokeless tobacco: Former    Quit date: 1983  . Tobacco comments:    quit  1983  Vaping Use  . Vaping status: Never Used  Substance and Sexual Activity  . Alcohol use: No  . Drug use: No  . Sexual  activity: Not Currently    Partners: Male    Birth control/protection: Post-menopausal

## 2024-08-06 ENCOUNTER — Encounter

## 2024-08-07 ENCOUNTER — Encounter (INDEPENDENT_AMBULATORY_CARE_PROVIDER_SITE_OTHER): Payer: Self-pay | Admitting: Family Medicine

## 2024-08-07 ENCOUNTER — Ambulatory Visit (INDEPENDENT_AMBULATORY_CARE_PROVIDER_SITE_OTHER): Admitting: Family Medicine

## 2024-08-07 VITALS — BP 131/79 | HR 82 | Temp 97.8°F | Ht 63.0 in | Wt 176.0 lb

## 2024-08-07 DIAGNOSIS — Z6831 Body mass index (BMI) 31.0-31.9, adult: Secondary | ICD-10-CM | POA: Diagnosis not present

## 2024-08-07 DIAGNOSIS — Z6837 Body mass index (BMI) 37.0-37.9, adult: Secondary | ICD-10-CM

## 2024-08-07 DIAGNOSIS — Z7985 Long-term (current) use of injectable non-insulin antidiabetic drugs: Secondary | ICD-10-CM

## 2024-08-07 DIAGNOSIS — E66812 Obesity, class 2: Secondary | ICD-10-CM | POA: Diagnosis not present

## 2024-08-07 DIAGNOSIS — R296 Repeated falls: Secondary | ICD-10-CM

## 2024-08-07 DIAGNOSIS — E119 Type 2 diabetes mellitus without complications: Secondary | ICD-10-CM

## 2024-08-07 DIAGNOSIS — E782 Mixed hyperlipidemia: Secondary | ICD-10-CM | POA: Diagnosis not present

## 2024-08-07 DIAGNOSIS — E1140 Type 2 diabetes mellitus with diabetic neuropathy, unspecified: Secondary | ICD-10-CM

## 2024-08-07 MED ORDER — ROSUVASTATIN CALCIUM 20 MG PO TABS: 20.0000 mg | ORAL_TABLET | Freq: Every day | ORAL | 0 refills | Status: DC

## 2024-08-07 MED ORDER — TIRZEPATIDE 7.5 MG/0.5ML ~~LOC~~ SOAJ: 7.5000 mg | SUBCUTANEOUS | 0 refills | Status: DC

## 2024-08-07 NOTE — Progress Notes (Signed)
 Office: [phone]  /  Fax: [phone]  WEIGHT SUMMARY AND BIOMETRICS  Anthropometric Measurements Height: 5' 3 (1.6 m) Weight: 176 lb (79.8 kg) BMI (Calculated): 31.18 Weight at Last Visit: 178 lb Weight Lost Since Last Visit: 2 lb Weight Gained Since Last Visit: 0 Starting Weight: 219 lb Total Weight Loss (lbs): 43 lb (19.5 kg) Peak Weight: 222 lb   Body Composition  Body Fat %: 47.8 % Fat Mass (lbs): 84.4 lbs Muscle Mass (lbs): 87.6 lbs Total Body Water  (lbs): 72.2 lbs Visceral Fat Rating : 14   Other Clinical Data Fasting: no Labs: no Today's Visit #: 40 Starting Date: 04/22/21    Chief Complaint: OBESITY  History of Present Illness Lauren [name] is a 78 year old female with obesity who presents for obesity treatment and progress assessment.  She is following the category two eating plan about fifty percent of the time, focusing on hydration, and increasing her intake of fruits and vegetables. She struggles to meet her protein goals, skips meals occasionally, and does not get adequate sleep most nights. She has lost two pounds in the last month.  She is being treated for type 2 diabetes and is on Mounjaro  7.5 mg weekly. She continues to work on decreasing simple carbohydrates in her diet.  She is being treated for hyperlipidemia and takes Crestor  20 mg daily. She continues to decrease simple carbohydrates and saturated fats in her diet.  She experienced a fall a month ago while reaching for her dog, resulting in hitting the corner of her kitchen table. This was her fifth fall this year, with two being serious. She reports pain in the same area as a previous fall at the pool. She stopped taking trospium  due to dizziness and has switched back to Myrbetriq  50 mg, which she finds less effective but more tolerable.  She reports urinary incontinence, which worsens with heavy lifting or bending over. She adheres to a two-hour schedule for bathroom  visits, especially at night, and has reduced nighttime awakenings to once per night. She uses pads for incontinence and is exploring options like chair yoga or Silver Sneakers for exercise.  She is scheduled for a sleep study next week to evaluate her sleep quality and restless leg syndrome. She has had multiple doctor visits this year and is actively managing her health conditions.      PHYSICAL EXAM:  Blood pressure 131/79, pulse 82, temperature 97.8 F (36.6 C), height 5' 3 (1.6 m), weight 176 lb (79.8 kg), SpO2 97%. Body mass index is 31.18 kg/m.  DIAGNOSTIC DATA REVIEWED:  BMET    Component Value Date/Time   NA 140 05/24/2024 1032   K 3.5 05/24/2024 1032   CL 101 05/24/2024 1032   CO2 21 05/24/2024 1032   GLUCOSE 68 (L) 05/24/2024 1032   GLUCOSE 118 (H) 12/07/2023 0736   BUN 7 (L) 05/24/2024 1032   CREATININE 0.81 05/24/2024 1032   CREATININE 0.87 06/08/2022 1439   CALCIUM  9.3 05/24/2024 1032   GFRNONAA 60 (L) 12/07/2023 0736   GFRNONAA >60 06/08/2022 1439   GFRAA >60 02/04/2020 1212   GFRAA >60 11/09/2018 1405   Lab Results  Component Value Date   HGBA1C 6.0 (H) 06/08/2023   HGBA1C 6.7 (H) 11/16/2016   Lab Results  Component Value Date   INSULIN  25.6 (H) 06/08/2023   INSULIN  22.3 04/22/2021   Lab Results  Component Value Date   TSH 2.450 04/11/2024   CBC    Component Value Date/Time  WBC 10.9 (H) 11/30/2023 1347   RBC 4.25 11/30/2023 1347   HGB 12.4 11/30/2023 1347   HGB 12.3 06/08/2022 1439   HGB 11.8 05/11/2022 0954   HCT 37.5 11/30/2023 1347   HCT 35.1 05/11/2022 0954   PLT 327 11/30/2023 1347   PLT 272 06/08/2022 1439   PLT 265 05/11/2022 0954   MCV 88.2 11/30/2023 1347   MCV 87 05/11/2022 0954   MCH 29.2 11/30/2023 1347   MCHC 33.1 11/30/2023 1347   RDW 13.3 11/30/2023 1347   RDW 13.2 05/11/2022 0954   Iron Studies    Component Value Date/Time   IRON 61 06/28/2024 0918   TIBC 292 06/28/2024 0918   FERRITIN 82 06/28/2024 0918    IRONPCTSAT 21 06/28/2024 0918   Lipid Panel     Component Value Date/Time   CHOL 123 06/08/2023 0941   TRIG 75 06/08/2023 0941   HDL 67 06/08/2023 0941   LDLCALC 41 06/08/2023 0941   Hepatic Function Panel     Component Value Date/Time   PROT 6.6 05/24/2024 1032   ALBUMIN  4.3 05/24/2024 1032   AST 17 05/24/2024 1032   AST 16 06/08/2022 1439   ALT 12 05/24/2024 1032   ALT 12 06/08/2022 1439   ALKPHOS 72 05/24/2024 1032   BILITOT 0.4 05/24/2024 1032   BILITOT 0.4 06/08/2022 1439   BILIDIR <0.1 (L) 07/30/2015 1338   IBILI NOT CALCULATED 07/30/2015 1338      Component Value Date/Time   TSH 2.450 04/11/2024 1208   TSH 2.150 05/11/2022 0954   Nutritional Lab Results  Component Value Date   VD25OH 57.5 02/07/2024   VD25OH 124.0 (H) 09/22/2023   VD25OH 100.0 06/08/2023     Assessment and Plan Assessment & Plan Obesity, class 2 Obesity management with a focus on dietary changes and exercise. She follows the category two eating plan about 50% of the time, working on hydration and increasing fruits and vegetables intake. Protein intake is inadequate, and she skips meals and lacks adequate sleep. Limited exercise due to a recent fall. Weight loss of two pounds in the last month, totaling a 45-pound loss over time. Visceral fat rating is 14, with a goal of reducing it to 12 or below. Muscle mass maintenance is crucial to prevent health decline. - Continue category two eating plan with emphasis on protein intake and meal regularity - Encourage hydration and increased intake of fruits and vegetables - Promote adequate sleep - Encourage core strengthening exercises to improve balance and prevent falls - Consider chair yoga or Silver Sneakers for exercise - Monitor visceral fat rating and muscle mass  Type 2 diabetes mellitus Type 2 diabetes managed with Mounjaro  7.5 mg weekly. She is working on reducing simple carbohydrates in her diet to control glucose levels. - Refill Mounjaro   7.5 mg weekly - Continue dietary modifications to reduce simple carbohydrates  Mixed hyperlipidemia Mixed hyperlipidemia managed with Crestor  20 mg daily. She is reducing simple carbohydrates and saturated fats in her diet to manage lipid levels. - Refill Crestor  20 mg daily - Continue dietary modifications to reduce simple carbohydrates and saturated fats  Recurrent falls Recurrent falls, with the most recent fall occurring a month ago. She experienced dizziness potentially related to trospium , which was discontinued. Currently on Myrbetriq  50 mg for overactive bladder. Concerns about urinary retention with previous medication. Plans to start physical therapy focusing on soft tissue and balance, with an emphasis on core strengthening. Use of a cane for stability and a fall detection  watch for safety. - Continue Myrbetriq  50 mg - Start physical therapy focusing on soft tissue and balance - Use a cane for stability - Utilize fall detection watch for safety       Wanona was informed of the importance of frequent follow up visits to maximize her success with intensive lifestyle modifications for her obesity and obesity related health conditions as recommended by USPSTF and CMS guidelines   Louann Penton, MD

## 2024-08-10 ENCOUNTER — Telehealth (INDEPENDENT_AMBULATORY_CARE_PROVIDER_SITE_OTHER): Admitting: Obstetrics and Gynecology

## 2024-08-10 DIAGNOSIS — N3281 Overactive bladder: Secondary | ICD-10-CM | POA: Diagnosis not present

## 2024-08-10 MED ORDER — VIBEGRON 75 MG PO TABS: 1.0000 | ORAL_TABLET | Freq: Every day | ORAL | 11 refills | Status: DC

## 2024-08-10 NOTE — Assessment & Plan Note (Addendum)
-   We discussed options of another medication, intravesical botox or nerve stimulation therapies (PTNS or SNM).  - She is interested in starting another medication. Prescribed Gemtesa 75mg  daily. If she has any difficulty with the medication, she will notify us .

## 2024-08-10 NOTE — Progress Notes (Signed)
 [facility] Return Visit- video visit Patient location- [place] Provider location- [place] office Pt was the only person on the call and consented to video visit.   SUBJECTIVE  History of Present Illness: TYTEANNA OST is a 78 y.o. female seen in follow-up for mixed incontinence. Last visit she started Trospium .   She had to go off the trospium  because she was feeling dizzy and it made it hard to empty. She went back on the myrbetriq  which works well sometimes. But sometimes she will have periods where she needs to urinate every hour.   She is going to PT for balance. Reports she has fallen several times in the last year. Because of this she has had to delay pelvic PT until early next year.   Past Medical History: Patient  has a past medical history of Anxiety, Arthritis, Atypical chest pain (06/25/2021), Back pain, Cancer (HCC), Cataract, Colitis, Coronary artery calcification (02/20/2020), Diabetes mellitus without complication (HCC), Edema (03/05/2021), Edema of both lower extremities, Endometriosis, Family history of adverse reaction to anesthesia, Family history of breast cancer, Family history of colon cancer, Family history of kidney cancer, Family history of prostate cancer, Fatty liver, GERD (gastroesophageal reflux disease), History of hiatal hernia, History of kidney stones, History of ulcerative colitis, Hypertension, Insomnia, Joint pain, Kidney problem, Lower extremity edema (03/05/2021), Lumbar disc disease, Neuropathy, Osteoarthritis, Other hyperlipidemia, Palpitations, Personal history of radiation therapy, Pneumonia, Pure hypercholesterolemia (02/20/2020), PVCs (premature ventricular contractions), Umbilical hernia, Vitamin D  deficiency, and Wears glasses.   Past Surgical History: She  has a past surgical history that includes Colon surgery; Wrist surgery; Myomectomy; kidney stone removed (12/2009); Right colectomy; Tendon release; RIGHT URETEROSCOPIC STONE EXTRACTION  (12-11-2009  DR NORLEEN SELTZER); LEFT THUMB CARPOMETACARPAL JOINT SUSPENSIONPLASTY (04-06-2006  DR SISSY); EXCISION RIGHT BREAST MASS  (10-20-2005  DR MAUDE SALT); LEFT WRIST ARTHROSCOPY W/ DEBRIDEMENT AND REMOVAL CYST (03-26-2004  DR SISSY); Cholecystectomy (1992); Breast biopsy (08-13-2009  DR TSUEI); Colon resection (1986); Esophageal manometry (N/A, 06/11/2013); Knee arthroscopy (Left); Esophagogastroduodenoscopy (egd) with propofol  (N/A, 92/68/7982); Total hip arthroplasty (Right, 11/26/2016); Total knee arthroplasty (Left, 12/02/2017); Breast excisional biopsy (Left); Breast excisional biopsy (Right); Tonsillectomy; Appendectomy; Fracture surgery; Radioactive seed guided mastectomy with axillary sentinel lymph node biopsy (Right, 11/16/2018); Breast lumpectomy (Right, 11/16/2018); Eye surgery (Bilateral, 2023); Total knee arthroplasty (Right, 08/03/2022); and Total hip arthroplasty (Left, 12/06/2023).   Medications: She has a current medication list which includes the following prescription(s): vibegron, aspirin  ec, b-complex with vitamin c, escitalopram , estradiol , fiber, fluticasone , gabapentin , hydrocodone -acetaminophen , onetouch delica plus lancet33g, magnesium , methocarbamol , onetouch verio, pantoprazole , phenylephrine , rosuvastatin , and tirzepatide .   Allergies: Patient is allergic to ciprofloxacin hcl, percocet [oxycodone -acetaminophen ], penicillins, sulfa antibiotics, tape, tetanus toxoid-containing vaccines, and jardiance [empagliflozin].   Social History: Patient  reports that she quit smoking about 41 years ago. Her smoking use included cigarettes. She started smoking about 51 years ago. She has a 10 pack-year smoking history. She quit smokeless tobacco use about 42 years ago. She reports that she does not drink alcohol and does not use drugs.     OBJECTIVE     Physical Exam: There were no vitals filed for this visit.   Gen: No apparent distress, A&O x 3.  Detailed  Urogynecologic Evaluation:  Deferred.    ASSESSMENT AND PLAN    Ms. Burbridge is a 78 y.o. with:  1. Overactive bladder      Overactive bladder Assessment & Plan: - We discussed options of another medication, intravesical botox or nerve stimulation therapies (PTNS or SNM).  -  She is interested in starting another medication. Prescribed Gemtesa 75mg  daily. If she has any difficulty with the medication, she will notify us .   Orders: -     Vibegron; Take 1 tablet (75 mg total) by mouth daily.  Dispense: 30 tablet; Refill: 11   Return 2 months  Rosaline LOISE Caper, MD

## 2024-08-15 ENCOUNTER — Ambulatory Visit: Admitting: Neurology

## 2024-08-15 DIAGNOSIS — Z96641 Presence of right artificial hip joint: Secondary | ICD-10-CM

## 2024-08-15 DIAGNOSIS — E1140 Type 2 diabetes mellitus with diabetic neuropathy, unspecified: Secondary | ICD-10-CM

## 2024-08-15 DIAGNOSIS — G2581 Restless legs syndrome: Secondary | ICD-10-CM

## 2024-08-15 DIAGNOSIS — G478 Other sleep disorders: Secondary | ICD-10-CM | POA: Diagnosis not present

## 2024-08-15 DIAGNOSIS — G619 Inflammatory polyneuropathy, unspecified: Secondary | ICD-10-CM

## 2024-08-15 DIAGNOSIS — Z8639 Personal history of other endocrine, nutritional and metabolic disease: Secondary | ICD-10-CM

## 2024-08-15 DIAGNOSIS — M7061 Trochanteric bursitis, right hip: Secondary | ICD-10-CM

## 2024-08-20 ENCOUNTER — Encounter

## 2024-08-20 ENCOUNTER — Encounter: Payer: Self-pay | Admitting: Sports Medicine

## 2024-08-20 DIAGNOSIS — Z96652 Presence of left artificial knee joint: Secondary | ICD-10-CM

## 2024-08-20 DIAGNOSIS — G2581 Restless legs syndrome: Secondary | ICD-10-CM

## 2024-08-20 DIAGNOSIS — E662 Morbid (severe) obesity with alveolar hypoventilation: Secondary | ICD-10-CM

## 2024-08-20 DIAGNOSIS — Z96641 Presence of right artificial hip joint: Secondary | ICD-10-CM

## 2024-08-20 NOTE — Therapy (Signed)
 OUTPATIENT PHYSICAL THERAPY LOWER EXTREMITY EVALUATION   Patient Name: Lauren [name] MRN: [id number] DOB:[birth year], 78 y.o., female Today's Date: 08/21/2024  END OF SESSION:  PT End of Session - 08/21/24 1108     Visit Number 1    Number of Visits 16    Date for Recertification  10/16/24    Authorization Type [employer] $15 COPAY    Progress Note Due on Visit 10    PT Start Time 1105    PT Stop Time 1146    PT Time Calculation (min) 41 min    Activity Tolerance Patient tolerated treatment well;Patient limited by pain    Behavior During Therapy [facility] for tasks assessed/performed          Past Medical History:  Diagnosis Date   Anxiety    Arthritis    Back, Hip- right   Atypical chest pain 06/25/2021   Back pain    Cancer (HCC)    right breast   Cataract    Colitis    Coronary artery calcification 02/20/2020   Diabetes mellitus without complication (HCC)    Type II   Edema 03/05/2021   Edema of both lower extremities    Endometriosis    Family history of adverse reaction to anesthesia    Father - N/V - blood pressure; daughter N/V   Family history of breast cancer    Family history of colon cancer    Family history of kidney cancer    Family history of prostate cancer    Fatty liver    GERD (gastroesophageal reflux disease)    History of hiatal hernia    History of kidney stones    History of ulcerative colitis    Hypertension    Insomnia    Joint pain    Kidney problem    Lower extremity edema 03/05/2021   Lumbar disc disease    L4 L5   Neuropathy    Osteoarthritis    Other hyperlipidemia    Palpitations    Personal history of radiation therapy    Pneumonia    1983, 2003   Pure hypercholesterolemia 02/20/2020   PVCs (premature ventricular contractions)    benign   Umbilical hernia    Vitamin D  deficiency    Wears glasses    Past Surgical History:  Procedure Laterality Date   APPENDECTOMY     BREAST BIOPSY  08-13-2009  DR TSUEI    EXCISION LEFT NIPPLE DUCT   BREAST EXCISIONAL BIOPSY Left    x2   BREAST EXCISIONAL BIOPSY Right    BREAST LUMPECTOMY Right 11/16/2018   invasive ductal   CHOLECYSTECTOMY  1992   COLON RESECTION  1986   ULCERATIVE COLITIS   COLON SURGERY     ESOPHAGEAL MANOMETRY N/A 06/11/2013   Procedure: ESOPHAGEAL MANOMETRY (EM);  Surgeon: Oliva FORBES Boots, MD;  Location: [facility];  Service: Endoscopy;  Laterality: N/A;   ESOPHAGOGASTRODUODENOSCOPY (EGD) WITH PROPOFOL  N/A 05/31/2016   Procedure: ESOPHAGOGASTRODUODENOSCOPY (EGD) WITH PROPOFOL ;  Surgeon: Oliva Boots, MD;  Location: [facility] ENDOSCOPY;  Service: Endoscopy;  Laterality: N/A;   EXCISION RIGHT BREAST MASS   10-20-2005  DR MAUDE YOUNG   EYE SURGERY Bilateral 2023   cataract   FRACTURE SURGERY     left arm   kidney stone removed  12/2009   KNEE ARTHROSCOPY Left    MCL   LEFT THUMB CARPOMETACARPAL JOINT SUSPENSIONPLASTY  04-06-2006  DR SISSY   LEFT WRIST ARTHROSCOPY W/ DEBRIDEMENT AND REMOVAL CYST  03-26-2004  DR SISSY   MYOMECTOMY     RADIOACTIVE SEED GUIDED PARTIAL MASTECTOMY WITH AXILLARY SENTINEL LYMPH NODE BIOPSY Right 11/16/2018   Procedure: RIGHT BREAST RADIOACTIVE SEED GUIDED PARTIAL MASTECTOMY WITH  SENTINEL  NODE BIOPSY;  Surgeon: Vernetta Berg, MD;  Location: [facility] OR;  Service: General;  Laterality: Right;   RIGHT COLECTOMY     RIGHT URETEROSCOPIC STONE EXTRACTION  12-11-2009  DR NORLEEN [facility]   TENDON RELEASE     TONSILLECTOMY     TOTAL HIP ARTHROPLASTY Right 11/26/2016   Procedure: RIGHT TOTAL HIP ARTHROPLASTY ANTERIOR APPROACH;  Surgeon: Lonni CINDERELLA Vernetta, MD;  Location: [facility] ORS;  Service: Orthopedics;  Laterality: Right;   TOTAL HIP ARTHROPLASTY Left 12/06/2023   Procedure: LEFT TOTAL HIP ARTHROPLASTY ANTERIOR APPROACH;  Surgeon: Vernetta Lonni CINDERELLA, MD;  Location: [facility] OR;  Service: Orthopedics;  Laterality: Left;   TOTAL KNEE ARTHROPLASTY Left 12/02/2017   Procedure: LEFT TOTAL KNEE ARTHROPLASTY;  Surgeon: Vernetta Lonni CINDERELLA, MD;  Location: [facility];  Service: Orthopedics;  Laterality: Left;   TOTAL KNEE ARTHROPLASTY Right 08/03/2022   Procedure: RIGHT TOTAL KNEE ARTHROPLASTY;  Surgeon: Vernetta Lonni CINDERELLA, MD;  Location: [facility] OR;  Service: Orthopedics;  Laterality: Right;   WRIST SURGERY     both   Patient Active Problem List   Diagnosis Date Noted   Vaginal atrophy 06/29/2024   History of iron deficiency 05/24/2024   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   Falls 05/01/2024   Iron deficiency 05/01/2024   Overactive bladder 02/22/2024   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/2023   SUI (stress urinary incontinence, female) 11/22/2023   RLS (restless legs syndrome) 09/22/2023   Other hyperlipidemia 06/08/2023   Unilateral primary osteoarthritis, left hip 05/11/2023   Depression 03/16/2023   Allergic rhinitis 12/29/2022   Seasonal affective disorder 12/01/2022   BMI 32.0-32.9,adult 12/01/2022   Obesity, Beginning BMI 37.59 12/01/2022   OA (osteoarthritis) of knee 08/03/2022   Status post total right knee replacement 08/03/2022   Stress 07/19/2022   Other constipation 07/19/2022   Atypical chest pain 06/25/2021   Class 2 severe obesity with serious comorbidity and body mass index (BMI) of 37.0 to 37.9 in adult 06/03/2021   Vitamin D  deficiency 06/03/2021   Other fatigue 04/22/2021   SOB (shortness of breath) on exertion 04/22/2021   Lower extremity edema 03/05/2021   Coronary artery calcification 02/20/2020   Pure hypercholesterolemia 02/20/2020   Unilateral primary osteoarthritis, right knee 01/03/2019   Genetic testing 12/07/2018   Family history of breast cancer    Family history of prostate cancer    Family history of colon cancer    Family history of kidney cancer    Malignant neoplasm of upper-outer quadrant of right breast in female, 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/09/2018   Diabetic neuropathy, type II diabetes mellitus (HCC) 11/09/2018   Status post total left knee  replacement 12/02/2017   Trochanteric bursitis, right hip 05/10/2017   Status post total replacement of right hip 11/26/2016   DDD (degenerative disc disease), 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   Low back pain 04/23/2015   Hiatal hernia 04/13/2013   Diabetes mellitus (HCC) 01/26/2013   Umbilical hernia 07/24/2012   Essential hypertension     PCP: Carlin Dale Gull, MD   REFERRING PROVIDER: Lonell Sprang, DO  REFERRING DIAG: M76.11 (ICD-10-CM) - Psoas tendinitis of right side M25.551,G89.29,Z96.641 (ICD-10-CM) - Chronic hip pain after total replacement of right hip joint R26.89 (ICD-10-CM) - Abnormality of gait due to impairment of balance M25.551,M25.552 (ICD-10-CM) - Bilateral  hip pain  THERAPY DIAG:  Pain in right hip  Other low back pain  Muscle weakness (generalized)  Abnormal posture  Unsteadiness on feet  Other abnormalities of gait and mobility  Rationale for Evaluation and Treatment: Rehabilitation  ONSET DATE: following HHPT for TKA 3 years ago   SUBJECTIVE:   SUBJECTIVE STATEMENT: Every time I lift my leg, it hurts.   PERTINENT HISTORY: Patient has been experiencing Rt hip pain (mainly iliopsoas) and balance issues. Patient has received injections from Dr. Burnetta for iliopsoas pain that assisted with pain. Patient has undergone bilat TKA and bilat THA with no other injuries or surgeries to surrounding areas. Patient intermittently experiencing low back pain. Patient has experienced 5 falls in past year with 2 major falls leading to injuries; has since been cleared by medical team.  May need to check BP prior to sessions   See PMH or personal factors for in depth comorbidities    PAIN:  Are you having pain? Yes: NPRS scale: 2/10 at rest, 10/10 at worst   Pain location: Rt hip  Pain description: sharp when moving, dull when sitting  Aggravating factors: stairs, into car, bed mobility/transfers  Relieving factors: heating pad, rest    PRECAUTIONS: Fall  RED  FLAGS: Bowel or bladder incontinence: No and Cauda equina syndrome: No   WEIGHT BEARING RESTRICTIONS: No  FALLS:  Has patient fallen in last 6 months? Yes. Number of falls 5; 2 caused injuries (one from a potential medication issue that has since been changed, had on clogs and landed flat with arms by side hitting face, bruised ribs, etc.)  LIVING ENVIRONMENT: Lives with: lives alone Lives in: House/apartment Stairs: Yes: Internal: 1 steps; on right going up Has following equipment at home: Single point cane, Walker - 2 wheeled, and Grab bars  OCCUPATION: retired   PLOF: Independent  PATIENT GOALS: be more independent, reduce pain to be more active, get down on the floor and back   NEXT MD VISIT: not scheduled with Dr. Burnetta, follow up with Dr. Vernetta Sep 05, 2024  OBJECTIVE:  Note: Objective measures were completed at Evaluation unless otherwise noted.  DIAGNOSTIC FINDINGS:  An AP pelvis shows bilateral total hip arthroplasties with no complicating  features.   PATIENT SURVEYS:  PSFS: THE PATIENT SPECIFIC FUNCTIONAL SCALE  Place score of 0-10 (0 = unable to perform activity and 10 = able to perform activity at the same level as before injury or problem)  Activity Date: 08/21/2024    Get in and out of car   1    2. Get in and out of bed   1    3. Go up and down steps   3    4.      Total Score 1.66      Total Score = Sum of activity scores/number of activities  Minimally Detectable Change: 3 points (for single activity); 2 points (for average score)  [facility] (nd). The Patient Specific Functional Scale . Retrieved from [email or web address]   COGNITION: Overall cognitive status: Within functional limits for tasks assessed     SENSATION: Not tested  EDEMA:    MUSCLE LENGTH: Not formally assessed secondary to pain   POSTURE: rounded shoulders, forward head, and increased thoracic  kyphosis  PALPATION: TTP at Rt iliopsoas, Rt lateral glutes, Rt posterior glutes   LOWER EXTREMITY ROM:  ROM Right Eval 08/21/2024 Left Eval 08/21/2024  Hip flexion AROM: 63deg PROM: ~95deg AROM: 110deg with back pain noted  Hip extension    Hip abduction    Hip adduction    Hip internal rotation    Hip external rotation    Knee flexion    Knee extension    Ankle dorsiflexion    Ankle plantarflexion    Ankle inversion    Ankle eversion     (Blank rows = not tested)  LOWER EXTREMITY MMT:  MMT Right Eval 08/21/2024 Left Eval 08/21/2024  Hip flexion (seated) 2+/5 3+/5  Hip extension (prone) 2+/5 3-/5  Hip abduction (sidelying) 2+/5 3+/5  Hip adduction    Hip internal rotation    Hip external rotation    Knee flexion(seated) 4+/5 5/5  Knee extension(seated) 3-/5 5/5  Ankle dorsiflexion(seated) 5/5 5/5  Ankle plantarflexion    Ankle inversion    Ankle eversion     (Blank rows = not tested)  LOWER EXTREMITY SPECIAL TESTS:    FUNCTIONAL TESTS:  5 times sit to stand: 18.53s  GAIT: Distance walked: not formally assessed  Assistive device utilized: None Level of assistance: supervision  Comments: antalgic gait pattern                                                                                                                                 TREATMENT DATE:  08/21/2024 TherEx:  HEP handout provided with patient performing one set of each activity for appropriate form. Verbal and tactile cues provided.  Self-Care:  POC, intervention options (dry needling, etc.), importance of overall strengthening and flexibility for pain     PATIENT EDUCATION:  Education details: HEP, POC, interventions, strength and flexibility  Person educated: Patient Education method: Explanation, Demonstration, Tactile cues, Verbal cues, and Handouts Education comprehension: verbalized understanding, returned demonstration, verbal cues required, and tactile cues  required  HOME EXERCISE PROGRAM: Access Code: [email or web address] URL: [email or web address] Date: 08/21/2024 Prepared by: Susannah Daring  Exercises - Standing Hip Abduction with Counter Support  - 1 x daily - 7 x weekly - 2 sets - 5-10 reps - Standing Hip Extension with Counter Support  - 1 x daily - 7 x weekly - 2 sets - 5-10 reps - Mini Squat with Counter Support  - 1 x daily - 7 x weekly - 2 sets - 5-10 reps - Modified Thomas Stretch  - 1 x daily - 7 x weekly - 3 sets - 15-30s hold - Standing Hip Flexor Stretch  - 1 x daily - 7 x weekly - 3 sets - 15-30s hold  ASSESSMENT:  CLINICAL IMPRESSION: Patient is a 78 y.o. F who was seen today for physical therapy evaluation and treatment for bilat hip pain (Rt>Lt) and imbalance presenting with motor coordination deficits, functional mobility deficits, strength deficits, unsteadiness, and pain. Patient mainly limited by high levels of pain in anterior hip. Patient will benefit from skilled PT to address above noted deficits.   OBJECTIVE IMPAIRMENTS: Abnormal gait, decreased activity tolerance, decreased balance, decreased coordination, decreased endurance, decreased mobility, difficulty  walking, decreased ROM, decreased strength, impaired flexibility, improper body mechanics, postural dysfunction, obesity, and pain.   ACTIVITY LIMITATIONS: bending, sitting, standing, squatting, sleeping, stairs, transfers, and bed mobility  PARTICIPATION LIMITATIONS: interpersonal relationship, community activity, and church  PERSONAL FACTORS: Past/current experiences, Time since onset of injury/illness/exacerbation, and 3+ comorbidities: anxiety, arthritis, cancer history, DM, GERD, HLD, HTN, kidney disease, ulcerative colitis, restless leg syndrome, lumbar disc disease  are also affecting patient's functional outcome.   REHAB POTENTIAL: Good  CLINICAL DECISION MAKING: Evolving/moderate complexity  EVALUATION COMPLEXITY: Moderate   GOALS: Goals  reviewed with patient? Yes  SHORT TERM GOALS: Target date: 09/11/2024 Patient will show compliance with initial HEP.  Baseline: Goal status: INITIAL  2.  Patient will report pain levels no greater than 7/10 to show overall improved quality of life. Baseline:  Goal status: INITIAL   LONG TERM GOALS: Target date: 10/16/2024  Patient will be independent with final HEP in order to maintain and progress upon functional gains made within PT. Baseline:  Goal status: INITIAL  2.  Patient will report pain levels no greater than 4/10 to show overall improved quality of life. Baseline:  Goal status: INITIAL  3.  Patient will increase PSFS to at least 3.66 in order to show a significant improvement in subjective disability rating. Baseline:  Goal status: INITIAL  4.  Patient will increase bilat hip abduction strength to at least 4-/5 in order to improve biomechanics with functional mobility. Baseline:  Goal status: INITIAL  5.  Patient will increase bilat hip extension strength to at least 3+/5 in order to improve biomechanics with functional mobility. Baseline:  Goal status: INITIAL  6.  Patient will increase Rt hip flexion AROM to at least 90deg in order to improve functional mobility. Baseline:  Goal status: INITIAL  7.  Patient will increase 5xSTS to at least 16.23s in order to decrease chances of falling. Baseline:  Goal status: INITIAL   PLAN:  PT FREQUENCY: 1-2x/week  PT DURATION: 8 weeks  PLANNED INTERVENTIONS: 97164- PT Re-evaluation, 97750- Physical Performance Testing, 97110-Therapeutic exercises, 97530- Therapeutic activity, [id number]- Neuromuscular re-education, 97535- Self Care, 02859- Manual therapy, [id number]- Gait training, [phone]- Orthotic Initial, [phone]- Orthotic/Prosthetic subsequent, [phone]- Canalith repositioning, [phone]- Aquatic Therapy, [phone]- Electrical stimulation (unattended), [phone]- Electrical stimulation (manual), [id number]- Vasopneumatic device, [id number]- Ultrasound,  [id number]- Traction (mechanical), [id number]- Ionotophoresis 4mg /ml Dexamethasone , 79439 (1-2 muscles), [id number] (3+ muscles)- Dry Needling, Patient/Family education, Balance training, Stair training, Taping, Joint mobilization, Joint manipulation, Spinal manipulation, Spinal mobilization, Scar mobilization, Compression bandaging, Vestibular training, DME instructions, Cryotherapy, and Moist heat  PLAN FOR NEXT SESSION: review HEP, hip flexor stretching, general hip/LE strengthening, balance assessment    Susannah Daring, PT, DPT 08/21/24 3:59 PM

## 2024-08-21 ENCOUNTER — Ambulatory Visit

## 2024-08-21 ENCOUNTER — Other Ambulatory Visit: Payer: Self-pay | Admitting: Sports Medicine

## 2024-08-21 DIAGNOSIS — M6281 Muscle weakness (generalized): Secondary | ICD-10-CM

## 2024-08-21 DIAGNOSIS — M25551 Pain in right hip: Secondary | ICD-10-CM

## 2024-08-21 DIAGNOSIS — R293 Abnormal posture: Secondary | ICD-10-CM

## 2024-08-21 DIAGNOSIS — Z96642 Presence of left artificial hip joint: Secondary | ICD-10-CM

## 2024-08-21 DIAGNOSIS — M5459 Other low back pain: Secondary | ICD-10-CM | POA: Diagnosis not present

## 2024-08-21 DIAGNOSIS — R2689 Other abnormalities of gait and mobility: Secondary | ICD-10-CM

## 2024-08-21 DIAGNOSIS — Z96641 Presence of right artificial hip joint: Secondary | ICD-10-CM

## 2024-08-21 DIAGNOSIS — R2681 Unsteadiness on feet: Secondary | ICD-10-CM

## 2024-08-26 NOTE — Procedures (Signed)
 [facility] POLYSOMNOGRAPHY  INTERPRETATION REPORT   STUDY DATE:  08/15/2024     PATIENT NAME:  Lauren [name]         DATE OF BIRTH:  09/28/1946  PATIENT ID:  [id number]    TYPE OF STUDY:  PSG  Sleep medicine : DEDRA GORES, MD REFERRED BY: [name], MD  SCORING TECHNICIAN: Delon Sprung, RPSGT   HISTORY:  This 78 year-old Female patient was seen on 05-24-2024  for RLS,  and has a chief complaint of non-restorative sleep . ADDITIONAL INFORMATION:   Epworth Sleepiness Scale : 10 /24 points (scores above or equal to 10 are suggestive of hypersomnolence). FSS endorsed at 36  /63 points. GDS at 2/ 15 ,   Height: 63 in Weight: 183 lb (BMI 32.6) Neck Size: 15.5 in  MEDICATIONS: Aspirin , Lexapro , Flonase , Neurontin , Norco, Magnesium , Robaxin , Mybretriq, Protonix , glycolax , Crestor , mounjaro .   TECHNICAL DESCRIPTION: A registered sleep technologist ( RPSGT)  was in attendance for the duration of the recording.  Data collection, scoring, video monitoring, and reporting were performed in compliance with the AASM Manual for the Scoring of Sleep and Associated Events; (Hypopnea is scored based on the criteria listed in Section VIII D. 1b in the AASM Manual V2.6 using a 4% oxygen desaturation rule or Hypopnea is scored based on the criteria listed in Section VIII D. 1a in the AASM Manual V2.6 using 3% oxygen desaturation and /or arousal rule).   SLEEP CONTINUITY AND SLEEP ARCHITECTURE:  Lights-out was at 21:36: and lights-on at  04:09:, with  6.6 hours of recording time . Total sleep time ( TST) was 208.0 minutes with a decreased sleep efficiency at 52.9%.  Sleep latency was increased at 57.0 minutes.  REM sleep latency was increased at 287.5 minutes. Of the total sleep time, the percentage of stage N1 sleep was 1.9%, stage N2 sleep was 98%, stage N3 sleep was 0.0%, and REM sleep was 0.2%.  There were 0 Stage R periods observed on this study night, 14  awakenings (i.e. transitions to Stage W from any sleep stage), and 35 total stage transitions. Wake after sleep onset (WASO) time accounted for 128 minutes.   SABRA  BODY POSITION:  TST was divided  between the following sleep positions: 52.6% supine;  47.4% lateral;  0% prone. Total supine REM sleep time was 00 minutes (0% of total REM sleep).  RESPIRATORY MONITORING:   Based on CMS criteria (using a 4% oxygen desaturation rule for scoring hypopneas), there were 0 apneas (0 obstructive; 0 central; 0 mixed), and 26 hypopneas.  Apnea index was 0.0. Hypopnea index was 7.5. The apnea-hypopnea index was 7.5/h overall (14.2 supine, 0 non-supine; 0.0 REM, 0.0 supine REM).  There were 0 respiratory effort-related arousals (RERAs).   OXIMETRY: Oxyhemoglobin Saturation Nadir during sleep was at  85% from a mean of 90%.  Total sleep time (TST) in   hypoxemia (<89%) was 36.1 minutes, or 17.4% of total sleep time.  LIMB MOVEMENTS: There were 131 periodic limb movements of sleep (37.8/hr), of which 3 (0.9/hr) were associated with an arousal. there were micro-arousals ( changes in the EEG but not in sleep stages) following many  PLMs. AROUSAL: There were 32 arousals in total, for an arousal index of 9 /hour.  Of these, 5 were identified as respiratory-related arousals (1 /h), 3 were PLM-related arousals (1 /h), and 29 were non-specific arousals (8 /h). The period before the patient was able to fall asleep also did not  show many limb movements.  EEG:  PSG EEG was of normal amplitude and frequency, with symmetric manifestation of sleep stages. EKG: The electrocardiogram documented  NSR.  The average heart rate during sleep was 75 bpm.  The heart rate during sleep varied between a minimum of 72 and  a maximum of  84 bpm. AUDIO and VIDEO: reviewed for limb movements, 3 bathroom visit, fragmented sleep.   IMPRESSION: 1) Mild Obstructive Sleep apnea was present and associated with prolonged times of low oxygen saturation (  hypoxia) .       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  these clusters of limb movement activity were seen in supine and in lateral sleep.  3) Total sleep time was reduced at 208.0 minutes.  Sleep efficiency was decreased at 52.9%.    Sleep fragmentation  was noted- and there was no REM sleep seen.  RECOMMENDATIONS:  1)  I  will treat the mild apnea first, as the associated severe hypoxia may be an additional trigger for limb movements breaking through.  Autotitration CPAP at 5-12 cm water , 1 cm EPR and mask of choice.  2) Dr. Ines had initiated a work up for neuropathy, found inflammatory markers. These may need to be further evaluated by rheumatology. The patient has chronic back pain as well, unclear if she has PAD/ vascular changes.  Hip and joint pain are non- neurologic manifestations but can contribute to her restlessness. I offer to increase gabapentin  or switch to Lyrica to help suppress the abnormal sensation.     RV in 4 months if the patient is interested in a CPAP trial,  for CPAP follow up with Np or me.   DEDRA GORES,  MD

## 2024-08-27 ENCOUNTER — Encounter

## 2024-08-30 ENCOUNTER — Telehealth: Payer: Self-pay | Admitting: Rehabilitative and Restorative Service Providers"

## 2024-08-30 ENCOUNTER — Encounter: Admitting: Rehabilitative and Restorative Service Providers"

## 2024-08-30 DIAGNOSIS — G2581 Restless legs syndrome: Secondary | ICD-10-CM | POA: Diagnosis not present

## 2024-08-30 DIAGNOSIS — M47817 Spondylosis without myelopathy or radiculopathy, lumbosacral region: Secondary | ICD-10-CM | POA: Diagnosis not present

## 2024-08-30 DIAGNOSIS — M25551 Pain in right hip: Secondary | ICD-10-CM | POA: Diagnosis not present

## 2024-08-30 DIAGNOSIS — M25552 Pain in left hip: Secondary | ICD-10-CM | POA: Diagnosis not present

## 2024-08-30 NOTE — Telephone Encounter (Signed)
 Called after 15 mins no show for appointment.  Pt indicated she was feeling ill and indicated calling office and leaving message this morning about having to miss appointment.  Reminded her of next appointment time.    Ozell Silvan, PT, DPT, OCS, ATC 08/30/24  9:04 AM

## 2024-08-30 NOTE — Telephone Encounter (Signed)
-----   Message from Edgewood Dohmeier sent at 08/27/2024  6:48 PM EDT ----- Patient with limb movements and mild apnea.  I  will treat the mild apnea first, as the associated severe hypoxia may be an additional trigger for limb movements breaking through.  Autotitration CPAP at 5-12 cm water , 1 cm EPR and mask of choice.  2) Dr. Ines had initiated a work up for neuropathy, found inflammatory markers. These may need to be further evaluated by rheumatology. ----- Message ----- From: Rebecka Fleeta Higashi In One Three One Sent: 08/26/2024   5:39 PM EDT To: Dedra Gores, MD

## 2024-08-30 NOTE — Therapy (Incomplete)
 OUTPATIENT PHYSICAL THERAPY TREATMENT   Patient Name: Lauren [name] MRN: [id number] DOB:May 24, 1946, 78 y.o., female Today's Date: 08/30/2024  END OF SESSION:    Past Medical History:  Diagnosis Date   Anxiety    Arthritis    Back, Hip- right   Atypical chest pain 06/25/2021   Back pain    Cancer (HCC)    right breast   Cataract    Colitis    Coronary artery calcification 02/20/2020   Diabetes mellitus without complication (HCC)    Type II   Edema 03/05/2021   Edema of both lower extremities    Endometriosis    Family history of adverse reaction to anesthesia    Father - N/V - blood pressure; daughter N/V   Family history of breast cancer    Family history of colon cancer    Family history of kidney cancer    Family history of prostate cancer    Fatty liver    GERD (gastroesophageal reflux disease)    History of hiatal hernia    History of kidney stones    History of ulcerative colitis    Hypertension    Insomnia    Joint pain    Kidney problem    Lower extremity edema 03/05/2021   Lumbar disc disease    L4 L5   Neuropathy    Osteoarthritis    Other hyperlipidemia    Palpitations    Personal history of radiation therapy    Pneumonia    1983, 2003   Pure hypercholesterolemia 02/20/2020   PVCs (premature ventricular contractions)    benign   Umbilical hernia    Vitamin D  deficiency    Wears glasses    Past Surgical History:  Procedure Laterality Date   APPENDECTOMY     BREAST BIOPSY  08-13-2009  DR TSUEI   EXCISION LEFT NIPPLE DUCT   BREAST EXCISIONAL BIOPSY Left    x2   BREAST EXCISIONAL BIOPSY Right    BREAST LUMPECTOMY Right 11/16/2018   invasive ductal   CHOLECYSTECTOMY  1992   COLON RESECTION  1986   ULCERATIVE COLITIS   COLON SURGERY     ESOPHAGEAL MANOMETRY N/A 06/11/2013   Procedure: ESOPHAGEAL MANOMETRY (EM);  Surgeon: Oliva FORBES Boots, MD;  Location: [facility];  Service: Endoscopy;  Laterality: N/A;   ESOPHAGOGASTRODUODENOSCOPY  (EGD) WITH PROPOFOL  N/A 05/31/2016   Procedure: ESOPHAGOGASTRODUODENOSCOPY (EGD) WITH PROPOFOL ;  Surgeon: Oliva Boots, MD;  Location: [facility] ENDOSCOPY;  Service: Endoscopy;  Laterality: N/A;   EXCISION RIGHT BREAST MASS   10-20-2005  DR MAUDE YOUNG   EYE SURGERY Bilateral 2023   cataract   FRACTURE SURGERY     left arm   kidney stone removed  12/2009   KNEE ARTHROSCOPY Left    MCL   LEFT THUMB CARPOMETACARPAL JOINT SUSPENSIONPLASTY  04-06-2006  DR SISSY   LEFT WRIST ARTHROSCOPY W/ DEBRIDEMENT AND REMOVAL CYST  03-26-2004  DR SISSY   MYOMECTOMY     RADIOACTIVE SEED GUIDED PARTIAL MASTECTOMY WITH AXILLARY SENTINEL LYMPH NODE BIOPSY Right 11/16/2018   Procedure: RIGHT BREAST RADIOACTIVE SEED GUIDED PARTIAL MASTECTOMY WITH  SENTINEL  NODE BIOPSY;  Surgeon: Vernetta Berg, MD;  Location: [facility] OR;  Service: General;  Laterality: Right;   RIGHT COLECTOMY     RIGHT URETEROSCOPIC STONE EXTRACTION  12-11-2009  DR NORLEEN [facility]   TENDON RELEASE     TONSILLECTOMY     TOTAL HIP ARTHROPLASTY Right 11/26/2016   Procedure: RIGHT TOTAL HIP ARTHROPLASTY ANTERIOR APPROACH;  Surgeon:  Lonni CINDERELLA Poli, MD;  Location: [facility];  Service: Orthopedics;  Laterality: Right;   TOTAL HIP ARTHROPLASTY Left 12/06/2023   Procedure: LEFT TOTAL HIP ARTHROPLASTY ANTERIOR APPROACH;  Surgeon: Poli Lonni CINDERELLA, MD;  Location: [facility] OR;  Service: Orthopedics;  Laterality: Left;   TOTAL KNEE ARTHROPLASTY Left 12/02/2017   Procedure: LEFT TOTAL KNEE ARTHROPLASTY;  Surgeon: Poli Lonni CINDERELLA, MD;  Location: [facility];  Service: Orthopedics;  Laterality: Left;   TOTAL KNEE ARTHROPLASTY Right 08/03/2022   Procedure: RIGHT TOTAL KNEE ARTHROPLASTY;  Surgeon: Poli Lonni CINDERELLA, MD;  Location: [facility] OR;  Service: Orthopedics;  Laterality: Right;   WRIST SURGERY     both   Patient Active Problem List   Diagnosis Date Noted   Vaginal atrophy 06/29/2024   History of iron deficiency 05/24/2024   Inflammatory neuropathy  05/24/2024   Falls 05/01/2024   Iron deficiency 05/01/2024   Overactive bladder 02/22/2024   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/2023   SUI (stress urinary incontinence, female) 11/22/2023   RLS (restless legs syndrome) 09/22/2023   Other hyperlipidemia 06/08/2023   Unilateral primary osteoarthritis, left hip 05/11/2023   Depression 03/16/2023   Allergic rhinitis 12/29/2022   Seasonal affective disorder 12/01/2022   BMI 32.0-32.9,adult 12/01/2022   Obesity, Beginning BMI 37.59 12/01/2022   OA (osteoarthritis) of knee 08/03/2022   Status post total right knee replacement 08/03/2022   Stress 07/19/2022   Other constipation 07/19/2022   Atypical chest pain 06/25/2021   Class 2 severe obesity with serious comorbidity and body mass index (BMI) of 37.0 to 37.9 in adult 06/03/2021   Vitamin D  deficiency 06/03/2021   Other fatigue 04/22/2021   SOB (shortness of breath) on exertion 04/22/2021   Lower extremity edema 03/05/2021   Coronary artery calcification 02/20/2020   Pure hypercholesterolemia 02/20/2020   Unilateral primary osteoarthritis, right knee 01/03/2019   Genetic testing 12/07/2018   Family history of breast cancer    Family history of prostate cancer    Family history of colon cancer    Family history of kidney cancer    Malignant neoplasm of upper-outer quadrant of right breast in female, 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/09/2018   Diabetic neuropathy, type II diabetes mellitus (HCC) 11/09/2018   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/02/2017   Trochanteric bursitis, right hip 05/10/2017   Status post total replacement of right hip 11/26/2016   DDD (degenerative disc disease), 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   Low back pain 04/23/2015   Hiatal hernia 04/13/2013   Diabetes mellitus (HCC) 01/26/2013   Umbilical hernia 07/24/2012   Essential hypertension     PCP: Carlin Dale Gull, MD   REFERRING PROVIDER: Lonell Sprang, DO  REFERRING DIAG: M76.11 (ICD-10-CM)  - Psoas tendinitis of right side M25.551,G89.29,Z96.641 (ICD-10-CM) - Chronic hip pain after total replacement of right hip joint R26.89 (ICD-10-CM) - Abnormality of gait due to impairment of balance M25.551,M25.552 (ICD-10-CM) - Bilateral hip pain  THERAPY DIAG:  No diagnosis found.  Rationale for Evaluation and Treatment: Rehabilitation  ONSET DATE: following HHPT for TKA 3 years ago   SUBJECTIVE:   SUBJECTIVE STATEMENT: Every time I lift my leg, it hurts.   PERTINENT HISTORY: Patient has been experiencing Rt hip pain (mainly iliopsoas) and balance issues. Patient has received injections from Dr. Sprang for iliopsoas pain that assisted with pain. Patient has undergone bilat TKA and bilat THA with no other injuries or surgeries to surrounding areas. Patient intermittently experiencing low back pain. Patient has experienced 5 falls in  past year with 2 major falls leading to injuries; has since been cleared by medical team.  May need to check BP prior to sessions   See PMH or personal factors for in depth comorbidities    PAIN:  NPRS scale: 2/10 at rest, 10/10 at worst   Pain location: Rt hip  Pain description: sharp when moving, dull when sitting  Aggravating factors: stairs, into car, bed mobility/transfers  Relieving factors: heating pad, rest    PRECAUTIONS: Fall  RED FLAGS: Bowel or bladder incontinence: No and Cauda equina syndrome: No   WEIGHT BEARING RESTRICTIONS: No  FALLS:  Has patient fallen in last 6 months? Yes. Number of falls 5; 2 caused injuries (one from a potential medication issue that has since been changed, had on clogs and landed flat with arms by side hitting face, bruised ribs, etc.)  LIVING ENVIRONMENT: Lives with: lives alone Lives in: House/apartment Stairs: Yes: Internal: 1 steps; on right going up Has following equipment at home: Single point cane, Walker - 2 wheeled, and Grab bars  OCCUPATION: retired   PLOF: Independent  PATIENT  GOALS: be more independent, reduce pain to be more active, get down on the floor and back   NEXT MD VISIT: not scheduled with Dr. Burnetta, follow up with Dr. Vernetta Sep 05, 2024  OBJECTIVE:  Note: Objective measures were completed at Evaluation unless otherwise noted.  DIAGNOSTIC FINDINGS:  An AP pelvis shows bilateral total hip arthroplasties with no complicating  features.   PATIENT SURVEYS:  PSFS: THE PATIENT SPECIFIC FUNCTIONAL SCALE  Place score of 0-10 (0 = unable to perform activity and 10 = able to perform activity at the same level as before injury or problem)  Activity Date: 08/21/2024    Get in and out of car   1    2. Get in and out of bed   1    3. Go up and down steps   3    4.      Total Score 1.66      Total Score = Sum of activity scores/number of activities  Minimally Detectable Change: 3 points (for single activity); 2 points (for average score)  [facility] (nd). The Patient Specific Functional Scale . Retrieved from [email or web address]   COGNITION: 08/21/2024 Overall cognitive status: Within functional limits for tasks assessed     SENSATION: 08/21/2024 Not tested  EDEMA:  08/21/2024 Not tested   MUSCLE LENGTH: 08/21/2024 Not formally assessed secondary to pain   POSTURE:  08/21/2024 rounded shoulders, forward head, and increased thoracic kyphosis  PALPATION: 08/21/2024 TTP at Rt iliopsoas, Rt lateral glutes, Rt posterior glutes   LOWER EXTREMITY ROM:  ROM Right Eval 08/21/2024 Left Eval 08/21/2024  Hip flexion AROM: 63deg PROM: ~95deg AROM: 110deg with back pain noted   Hip extension    Hip abduction    Hip adduction    Hip internal rotation    Hip external rotation    Knee flexion    Knee extension    Ankle dorsiflexion    Ankle plantarflexion    Ankle inversion    Ankle eversion     (Blank rows = not tested)  LOWER EXTREMITY MMT:  MMT  Right Eval 08/21/2024 Left Eval 08/21/2024  Hip flexion (seated) 2+/5 3+/5  Hip extension (prone) 2+/5 3-/5  Hip abduction (sidelying) 2+/5 3+/5  Hip adduction    Hip internal rotation    Hip external rotation    Knee flexion(seated) 4+/5 5/5  Knee extension(seated)  3-/5 5/5  Ankle dorsiflexion(seated) 5/5 5/5  Ankle plantarflexion    Ankle inversion    Ankle eversion     (Blank rows = not tested)  LOWER EXTREMITY SPECIAL TESTS:  08/21/2024 No testing.    FUNCTIONAL TESTS:  08/21/2024 5 times sit to stand: 18.53s  GAIT: 08/21/2024 Distance walked: not formally assessed  Assistive device utilized: None Level of assistance: supervision  Comments: antalgic gait pattern                     TREATMENT        DATE: 08/30/2024 Therex:   Neuro Re-ed:      TREATMENT        DATE: 08/21/2024 TherEx:  HEP handout provided with patient performing one set of each activity for appropriate form. Verbal and tactile cues provided.  Self-Care:  POC, intervention options (dry needling, etc.), importance of overall strengthening and flexibility for pain     PATIENT EDUCATION:  Education details: HEP, POC, interventions, strength and flexibility  Person educated: Patient Education method: Explanation, Demonstration, Tactile cues, Verbal cues, and Handouts Education comprehension: verbalized understanding, returned demonstration, verbal cues required, and tactile cues required  HOME EXERCISE PROGRAM: Access Code: [email or web address] URL: [email or web address] Date: 08/21/2024 Prepared by: Susannah Daring  Exercises - Standing Hip Abduction with Counter Support  - 1 x daily - 7 x weekly - 2 sets - 5-10 reps - Standing Hip Extension with Counter Support  - 1 x daily - 7 x weekly - 2 sets - 5-10 reps - Mini Squat with Counter Support  - 1 x daily - 7 x weekly - 2 sets - 5-10 reps - Modified Thomas Stretch  - 1 x daily - 7 x weekly - 3 sets - 15-30s hold - Standing Hip  Flexor Stretch  - 1 x daily - 7 x weekly - 3 sets - 15-30s hold  ASSESSMENT:  CLINICAL IMPRESSION: Patient is a 78 y.o. F who was seen today for physical therapy evaluation and treatment for bilat hip pain (Rt>Lt) and imbalance presenting with motor coordination deficits, functional mobility deficits, strength deficits, unsteadiness, and pain. Patient mainly limited by high levels of pain in anterior hip. Patient will benefit from skilled PT to address above noted deficits.   OBJECTIVE IMPAIRMENTS: Abnormal gait, decreased activity tolerance, decreased balance, decreased coordination, decreased endurance, decreased mobility, difficulty walking, decreased ROM, decreased strength, impaired flexibility, improper body mechanics, postural dysfunction, obesity, and pain.   ACTIVITY LIMITATIONS: bending, sitting, standing, squatting, sleeping, stairs, transfers, and bed mobility  PARTICIPATION LIMITATIONS: interpersonal relationship, community activity, and church  PERSONAL FACTORS: Past/current experiences, Time since onset of injury/illness/exacerbation, and 3+ comorbidities: anxiety, arthritis, cancer history, DM, GERD, HLD, HTN, kidney disease, ulcerative colitis, restless leg syndrome, lumbar disc disease  are also affecting patient's functional outcome.   REHAB POTENTIAL: Good  CLINICAL DECISION MAKING: Evolving/moderate complexity  EVALUATION COMPLEXITY: Moderate   GOALS: Goals reviewed with patient? Yes  SHORT TERM GOALS: Target date: 09/11/2024 Patient will show compliance with initial HEP.  Baseline: Goal status: INITIAL  2.  Patient will report pain levels no greater than 7/10 to show overall improved quality of life. Baseline:  Goal status: INITIAL   LONG TERM GOALS: Target date: 10/16/2024  Patient will be independent with final HEP in order to maintain and progress upon functional gains made within PT. Baseline:  Goal status: INITIAL  2.  Patient will report pain  levels no greater than 4/10 to show overall improved quality of  life. Baseline:  Goal status: INITIAL  3.  Patient will increase PSFS to at least 3.66 in order to show a significant improvement in subjective disability rating. Baseline:  Goal status: INITIAL  4.  Patient will increase bilat hip abduction strength to at least 4-/5 in order to improve biomechanics with functional mobility. Baseline:  Goal status: INITIAL  5.  Patient will increase bilat hip extension strength to at least 3+/5 in order to improve biomechanics with functional mobility. Baseline:  Goal status: INITIAL  6.  Patient will increase Rt hip flexion AROM to at least 90deg in order to improve functional mobility. Baseline:  Goal status: INITIAL  7.  Patient will increase 5xSTS to at least 16.23s in order to decrease chances of falling. Baseline:  Goal status: INITIAL   PLAN:  PT FREQUENCY: 1-2x/week  PT DURATION: 8 weeks  PLANNED INTERVENTIONS: [id number]- PT Re-evaluation, 97750- Physical Performance Testing, 97110-Therapeutic exercises, 97530- Therapeutic activity, [id number]- Neuromuscular re-education, 97535- Self Care, [id number]- Manual therapy, [id number]- Gait training, [phone]- Orthotic Initial, [phone]- Orthotic/Prosthetic subsequent, [phone]- Canalith repositioning, [phone]- Aquatic Therapy, [phone]- Electrical stimulation (unattended), [phone]- Electrical stimulation (manual), [id number]- Vasopneumatic device, [id number]- Ultrasound, [id number]- Traction (mechanical), [id number]- Ionotophoresis 4mg /ml Dexamethasone , 79439 (1-2 muscles), [id number] (3+ muscles)- Dry Needling, Patient/Family education, Balance training, Stair training, Taping, Joint mobilization, Joint manipulation, Spinal manipulation, Spinal mobilization, Scar mobilization, Compression bandaging, Vestibular training, DME instructions, Cryotherapy, and Moist heat  PLAN FOR NEXT SESSION: review HEP, hip flexor stretching, general hip/LE strengthening, balance assessment    Ozell Silvan,  PT, DPT, OCS, ATC 08/30/24  7:48 AM

## 2024-08-30 NOTE — Telephone Encounter (Addendum)
 Spoke with patient and discussed her sleep study results as noted below by Dr Chalice. Patient agreed to try autopap. Discussed insurance compliance requirements which includes using the machine at least 4 hours at night and also being seen by our office between 30 and 90 days after setup. Pt will use the 11/27/24 appt with Dr Chalice as this follow-up and wanted to cancel the December appt with Megan NP. Pt asked if Dr Chalice would refer to rheumatology.   Sleep study sent to pcp at pt's request. Autopap referral sent to [facility].   Zott, Glade Boring, Heather CROME, RN; [place], [place]; [place], [place] Got It Thank you

## 2024-08-31 NOTE — Therapy (Signed)
 OUTPATIENT PHYSICAL THERAPY TREATMENT   Patient Name: Lauren [name] MRN: [id number] DOB:1946-10-04, 78 y.o., female Today's Date: 09/03/2024  END OF SESSION:  PT End of Session - 09/03/24 1350     Visit Number 2    Number of Visits 16    Date for Recertification  10/16/24    Authorization Type [employer] $15 COPAY    Progress Note Due on Visit 10    PT Start Time 1350    PT Stop Time 1430    PT Time Calculation (min) 40 min    Activity Tolerance Patient tolerated treatment well;Patient limited by pain    Behavior During Therapy [facility] for tasks assessed/performed           Past Medical History:  Diagnosis Date   Anxiety    Arthritis    Back, Hip- right   Atypical chest pain 06/25/2021   Back pain    Cancer (HCC)    right breast   Cataract    Colitis    Coronary artery calcification 02/20/2020   Diabetes mellitus without complication (HCC)    Type II   Edema 03/05/2021   Edema of both lower extremities    Endometriosis    Family history of adverse reaction to anesthesia    Father - N/V - blood pressure; daughter N/V   Family history of breast cancer    Family history of colon cancer    Family history of kidney cancer    Family history of prostate cancer    Fatty liver    GERD (gastroesophageal reflux disease)    History of hiatal hernia    History of kidney stones    History of ulcerative colitis    Hypertension    Insomnia    Joint pain    Kidney problem    Lower extremity edema 03/05/2021   Lumbar disc disease    L4 L5   Neuropathy    Osteoarthritis    Other hyperlipidemia    Palpitations    Personal history of radiation therapy    Pneumonia    1983, 2003   Pure hypercholesterolemia 02/20/2020   PVCs (premature ventricular contractions)    benign   Umbilical hernia    Vitamin D  deficiency    Wears glasses    Past Surgical History:  Procedure Laterality Date   APPENDECTOMY     BREAST BIOPSY  08-13-2009  DR TSUEI   EXCISION LEFT NIPPLE  DUCT   BREAST EXCISIONAL BIOPSY Left    x2   BREAST EXCISIONAL BIOPSY Right    BREAST LUMPECTOMY Right 11/16/2018   invasive ductal   CHOLECYSTECTOMY  1992   COLON RESECTION  1986   ULCERATIVE COLITIS   COLON SURGERY     ESOPHAGEAL MANOMETRY N/A 06/11/2013   Procedure: ESOPHAGEAL MANOMETRY (EM);  Surgeon: Oliva FORBES Boots, MD;  Location: [facility];  Service: Endoscopy;  Laterality: N/A;   ESOPHAGOGASTRODUODENOSCOPY (EGD) WITH PROPOFOL  N/A 05/31/2016   Procedure: ESOPHAGOGASTRODUODENOSCOPY (EGD) WITH PROPOFOL ;  Surgeon: Oliva Boots, MD;  Location: [facility] ENDOSCOPY;  Service: Endoscopy;  Laterality: N/A;   EXCISION RIGHT BREAST MASS   10-20-2005  DR MAUDE YOUNG   EYE SURGERY Bilateral 2023   cataract   FRACTURE SURGERY     left arm   kidney stone removed  12/2009   KNEE ARTHROSCOPY Left    MCL   LEFT THUMB CARPOMETACARPAL JOINT SUSPENSIONPLASTY  04-06-2006  DR SISSY   LEFT WRIST ARTHROSCOPY W/ DEBRIDEMENT AND REMOVAL CYST  03-26-2004  DR  WEINGOLD   MYOMECTOMY     RADIOACTIVE SEED GUIDED PARTIAL MASTECTOMY WITH AXILLARY SENTINEL LYMPH NODE BIOPSY Right 11/16/2018   Procedure: RIGHT BREAST RADIOACTIVE SEED GUIDED PARTIAL MASTECTOMY WITH  SENTINEL  NODE BIOPSY;  Surgeon: Vernetta Berg, MD;  Location: [facility] OR;  Service: General;  Laterality: Right;   RIGHT COLECTOMY     RIGHT URETEROSCOPIC STONE EXTRACTION  12-11-2009  DR NORLEEN [facility]   TENDON RELEASE     TONSILLECTOMY     TOTAL HIP ARTHROPLASTY Right 11/26/2016   Procedure: RIGHT TOTAL HIP ARTHROPLASTY ANTERIOR APPROACH;  Surgeon: Lonni CINDERELLA Vernetta, MD;  Location: [facility] ORS;  Service: Orthopedics;  Laterality: Right;   TOTAL HIP ARTHROPLASTY Left 12/06/2023   Procedure: LEFT TOTAL HIP ARTHROPLASTY ANTERIOR APPROACH;  Surgeon: Vernetta Lonni CINDERELLA, MD;  Location: [facility] OR;  Service: Orthopedics;  Laterality: Left;   TOTAL KNEE ARTHROPLASTY Left 12/02/2017   Procedure: LEFT TOTAL KNEE ARTHROPLASTY;  Surgeon: Vernetta Lonni CINDERELLA, MD;   Location: [facility];  Service: Orthopedics;  Laterality: Left;   TOTAL KNEE ARTHROPLASTY Right 08/03/2022   Procedure: RIGHT TOTAL KNEE ARTHROPLASTY;  Surgeon: Vernetta Lonni CINDERELLA, MD;  Location: [facility] OR;  Service: Orthopedics;  Laterality: Right;   WRIST SURGERY     both   Patient Active Problem List   Diagnosis Date Noted   Vaginal atrophy 06/29/2024   History of iron deficiency 05/24/2024   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   Falls 05/01/2024   Iron deficiency 05/01/2024   Overactive bladder 02/22/2024   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/2023   SUI (stress urinary incontinence, female) 11/22/2023   RLS (restless legs syndrome) 09/22/2023   Other hyperlipidemia 06/08/2023   Unilateral primary osteoarthritis, left hip 05/11/2023   Depression 03/16/2023   Allergic rhinitis 12/29/2022   Seasonal affective disorder 12/01/2022   BMI 32.0-32.9,adult 12/01/2022   Obesity, Beginning BMI 37.59 12/01/2022   OA (osteoarthritis) of knee 08/03/2022   Status post total right knee replacement 08/03/2022   Stress 07/19/2022   Other constipation 07/19/2022   Atypical chest pain 06/25/2021   Class 2 severe obesity with serious comorbidity and body mass index (BMI) of 37.0 to 37.9 in adult 06/03/2021   Vitamin D  deficiency 06/03/2021   Other fatigue 04/22/2021   SOB (shortness of breath) on exertion 04/22/2021   Lower extremity edema 03/05/2021   Coronary artery calcification 02/20/2020   Pure hypercholesterolemia 02/20/2020   Unilateral primary osteoarthritis, right knee 01/03/2019   Genetic testing 12/07/2018   Family history of breast cancer    Family history of prostate cancer    Family history of colon cancer    Family history of kidney cancer    Malignant neoplasm of upper-outer quadrant of right breast in female, 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/09/2018   Diabetic neuropathy, type II diabetes mellitus (HCC) 11/09/2018   Status post total left knee replacement  12/02/2017   Trochanteric bursitis, right hip 05/10/2017   Status post total replacement of right hip 11/26/2016   DDD (degenerative disc disease), 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   Low back pain 04/23/2015   Hiatal hernia 04/13/2013   Diabetes mellitus (HCC) 01/26/2013   Umbilical hernia 07/24/2012   Essential hypertension     PCP: Carlin Dale Gull, MD   REFERRING PROVIDER: Lonell Sprang, DO  REFERRING DIAG: M76.11 (ICD-10-CM) - Psoas tendinitis of right side M25.551,G89.29,Z96.641 (ICD-10-CM) - Chronic hip pain after total replacement of right hip joint R26.89 (ICD-10-CM) - Abnormality of gait due to impairment of balance M25.551,M25.552 (ICD-10-CM) - Bilateral hip  pain  THERAPY DIAG:  Pain in right hip  Other low back pain  Muscle weakness (generalized)  Abnormal posture  Unsteadiness on feet  Other abnormalities of gait and mobility  Rationale for Evaluation and Treatment: Rehabilitation  ONSET DATE: following HHPT for TKA 3 years ago   SUBJECTIVE:   SUBJECTIVE STATEMENT: Patient reports that some days are bad and some days are good.   PERTINENT HISTORY: Patient has been experiencing Rt hip pain (mainly iliopsoas) and balance issues. Patient has received injections from Dr. Burnetta for iliopsoas pain that assisted with pain. Patient has undergone bilat TKA and bilat THA with no other injuries or surgeries to surrounding areas. Patient intermittently experiencing low back pain. Patient has experienced 5 falls in past year with 2 major falls leading to injuries; has since been cleared by medical team.  May need to check BP prior to sessions   See PMH or personal factors for in depth comorbidities    PAIN:  NPRS scale: 5/10 soreness/pain  Pain location: Rt hip  Pain description: sharp when moving, dull when sitting  Aggravating factors: stairs, into car, bed mobility/transfers  Relieving factors: heating pad, rest    PRECAUTIONS: Fall  RED FLAGS: Bowel or bladder  incontinence: No and Cauda equina syndrome: No   WEIGHT BEARING RESTRICTIONS: No  FALLS:  Has patient fallen in last 6 months? Yes. Number of falls 5; 2 caused injuries (one from a potential medication issue that has since been changed, had on clogs and landed flat with arms by side hitting face, bruised ribs, etc.)  LIVING ENVIRONMENT: Lives with: lives alone Lives in: House/apartment Stairs: Yes: Internal: 1 steps; on right going up Has following equipment at home: Single point cane, Walker - 2 wheeled, and Grab bars  OCCUPATION: retired   PLOF: Independent  PATIENT GOALS: be more independent, reduce pain to be more active, get down on the floor and back   NEXT MD VISIT: not scheduled with Dr. Burnetta, follow up with Dr. Vernetta Sep 05, 2024  OBJECTIVE:  Note: Objective measures were completed at Evaluation unless otherwise noted.  DIAGNOSTIC FINDINGS:  An AP pelvis shows bilateral total hip arthroplasties with no complicating  features.   PATIENT SURVEYS:  PSFS: THE PATIENT SPECIFIC FUNCTIONAL SCALE  Place score of 0-10 (0 = unable to perform activity and 10 = able to perform activity at the same level as before injury or problem)  Activity Date: 08/21/2024    Get in and out of car   1    2. Get in and out of bed   1    3. Go up and down steps   3    4.      Total Score 1.66      Total Score = Sum of activity scores/number of activities  Minimally Detectable Change: 3 points (for single activity); 2 points (for average score)  [facility] (nd). The Patient Specific Functional Scale . Retrieved from [email or web address]   COGNITION: 08/21/2024 Overall cognitive status: Within functional limits for tasks assessed     SENSATION: 08/21/2024 Not tested  EDEMA:  08/21/2024 Not tested   MUSCLE LENGTH: 08/21/2024 Not formally assessed secondary to pain   POSTURE:  08/21/2024 rounded shoulders,  forward head, and increased thoracic kyphosis  PALPATION: 08/21/2024 TTP at Rt iliopsoas, Rt lateral glutes, Rt posterior glutes   LOWER EXTREMITY ROM:  ROM Right Eval 08/21/2024 Left Eval 08/21/2024  Hip flexion AROM: 63deg PROM: ~95deg AROM: 110deg with back  pain noted   Hip extension    Hip abduction    Hip adduction    Hip internal rotation    Hip external rotation    Knee flexion    Knee extension    Ankle dorsiflexion    Ankle plantarflexion    Ankle inversion    Ankle eversion     (Blank rows = not tested)  LOWER EXTREMITY MMT:  MMT Right Eval 08/21/2024 Left Eval 08/21/2024  Hip flexion (seated) 2+/5 3+/5  Hip extension (prone) 2+/5 3-/5  Hip abduction (sidelying) 2+/5 3+/5  Hip adduction    Hip internal rotation    Hip external rotation    Knee flexion(seated) 4+/5 5/5  Knee extension(seated) 3-/5 5/5  Ankle dorsiflexion(seated) 5/5 5/5  Ankle plantarflexion    Ankle inversion    Ankle eversion     (Blank rows = not tested)  LOWER EXTREMITY SPECIAL TESTS:  08/21/2024 No testing.    FUNCTIONAL TESTS:  08/21/2024 5 times sit to stand: 18.53s  GAIT: 08/21/2024 Distance walked: not formally assessed  Assistive device utilized: None Level of assistance: supervision  Comments: antalgic gait pattern                     TREATMENT        DATE: 09/03/2024 Therex: UBE with bilat LE only level 2 for 8 minutes  Bilat leg press 3x10 with 75#   Neuro Re-ed: performed within the parallel bars with CGA throughout  Tandem stance 2x30s with each leg back; intermittent UE use throughout secondary to lateral LOB  Narrow stance with horizontal head turns 1x16 turns  Narrow stance with vertical head turns 1x16 turns  Typical stance on airex 1x30s  Narrow stance on airex 1x30s  PT educated and discussed 3 balance systems, why tandem stance is more difficult and causes soreness with Rt LE back, how other comorbidities can affect balance, and how the balance  system needs to be challenged just like muscles in order to strengthen    TREATMENT        DATE: 08/21/2024 TherEx:  HEP handout provided with patient performing one set of each activity for appropriate form. Verbal and tactile cues provided.  Self-Care:  POC, intervention options (dry needling, etc.), importance of overall strengthening and flexibility for pain     PATIENT EDUCATION:  Education details: HEP, POC, interventions, strength and flexibility  Person educated: Patient Education method: Explanation, Demonstration, Tactile cues, Verbal cues, and Handouts Education comprehension: verbalized understanding, returned demonstration, verbal cues required, and tactile cues required  HOME EXERCISE PROGRAM: Access Code: [email or web address] URL: [email or web address] Date: 08/21/2024 Prepared by: Susannah Daring  Exercises - Standing Hip Abduction with Counter Support  - 1 x daily - 7 x weekly - 2 sets - 5-10 reps - Standing Hip Extension with Counter Support  - 1 x daily - 7 x weekly - 2 sets - 5-10 reps - Mini Squat with Counter Support  - 1 x daily - 7 x weekly - 2 sets - 5-10 reps - Modified Thomas Stretch  - 1 x daily - 7 x weekly - 3 sets - 15-30s hold - Standing Hip Flexor Stretch  - 1 x daily - 7 x weekly - 3 sets - 15-30s hold  ASSESSMENT:  CLINICAL IMPRESSION: Patient arrived to session noting intermittent bad days with soreness/pain in the Rt hip and increased back pain secondary to poor sleep. Patient tolerated all activities this date, though had increased difficulty when transitioning to sitting  on leg press. Patient will continue to benefit from skilled PT.  OBJECTIVE IMPAIRMENTS: Abnormal gait, decreased activity tolerance, decreased balance, decreased coordination, decreased endurance, decreased mobility, difficulty walking, decreased ROM, decreased strength, impaired flexibility, improper body mechanics, postural dysfunction, obesity, and pain.   ACTIVITY  LIMITATIONS: bending, sitting, standing, squatting, sleeping, stairs, transfers, and bed mobility  PARTICIPATION LIMITATIONS: interpersonal relationship, community activity, and church  PERSONAL FACTORS: Past/current experiences, Time since onset of injury/illness/exacerbation, and 3+ comorbidities: anxiety, arthritis, cancer history, DM, GERD, HLD, HTN, kidney disease, ulcerative colitis, restless leg syndrome, lumbar disc disease  are also affecting patient's functional outcome.   REHAB POTENTIAL: Good  CLINICAL DECISION MAKING: Evolving/moderate complexity  EVALUATION COMPLEXITY: Moderate   GOALS: Goals reviewed with patient? Yes  SHORT TERM GOALS: Target date: 09/11/2024 Patient will show compliance with initial HEP.  Baseline: Goal status: INITIAL  2.  Patient will report pain levels no greater than 7/10 to show overall improved quality of life. Baseline:  Goal status: INITIAL   LONG TERM GOALS: Target date: 10/16/2024  Patient will be independent with final HEP in order to maintain and progress upon functional gains made within PT. Baseline:  Goal status: INITIAL  2.  Patient will report pain levels no greater than 4/10 to show overall improved quality of life. Baseline:  Goal status: INITIAL  3.  Patient will increase PSFS to at least 3.66 in order to show a significant improvement in subjective disability rating. Baseline:  Goal status: INITIAL  4.  Patient will increase bilat hip abduction strength to at least 4-/5 in order to improve biomechanics with functional mobility. Baseline:  Goal status: INITIAL  5.  Patient will increase bilat hip extension strength to at least 3+/5 in order to improve biomechanics with functional mobility. Baseline:  Goal status: INITIAL  6.  Patient will increase Rt hip flexion AROM to at least 90deg in order to improve functional mobility. Baseline:  Goal status: INITIAL  7.  Patient will increase 5xSTS to at least 16.23s in  order to decrease chances of falling. Baseline:  Goal status: INITIAL   PLAN:  PT FREQUENCY: 1-2x/week  PT DURATION: 8 weeks  PLANNED INTERVENTIONS: 97164- PT Re-evaluation, 97750- Physical Performance Testing, 97110-Therapeutic exercises, 97530- Therapeutic activity, [id number]- Neuromuscular re-education, 97535- Self Care, [id number]- Manual therapy, [id number]- Gait training, [phone]- Orthotic Initial, [phone]- Orthotic/Prosthetic subsequent, [phone]- Canalith repositioning, [phone]- Aquatic Therapy, [phone]- Electrical stimulation (unattended), [phone]- Electrical stimulation (manual), [id number]- Vasopneumatic device, [id number]- Ultrasound, [id number]- Traction (mechanical), [id number]- Ionotophoresis 4mg /ml Dexamethasone , [id number] (1-2 muscles), [id number] (3+ muscles)- Dry Needling, Patient/Family education, Balance training, Stair training, Taping, Joint mobilization, Joint manipulation, Spinal manipulation, Spinal mobilization, Scar mobilization, Compression bandaging, Vestibular training, DME instructions, Cryotherapy, and Moist heat  PLAN FOR NEXT SESSION:  hip flexor stretching, general hip/LE strengthening, balance, FGA (?)   Susannah Daring, PT, DPT 09/03/24 2:45 PM

## 2024-09-03 ENCOUNTER — Encounter: Payer: Self-pay | Admitting: Radiology

## 2024-09-03 ENCOUNTER — Ambulatory Visit

## 2024-09-03 DIAGNOSIS — R2689 Other abnormalities of gait and mobility: Secondary | ICD-10-CM

## 2024-09-03 DIAGNOSIS — M6281 Muscle weakness (generalized): Secondary | ICD-10-CM

## 2024-09-03 DIAGNOSIS — R2681 Unsteadiness on feet: Secondary | ICD-10-CM

## 2024-09-03 DIAGNOSIS — R293 Abnormal posture: Secondary | ICD-10-CM | POA: Diagnosis not present

## 2024-09-03 DIAGNOSIS — M5459 Other low back pain: Secondary | ICD-10-CM

## 2024-09-03 DIAGNOSIS — M25551 Pain in right hip: Secondary | ICD-10-CM

## 2024-09-03 NOTE — Progress Notes (Signed)
 This result thread has been sent to Dr Okey.

## 2024-09-04 NOTE — Therapy (Signed)
 OUTPATIENT PHYSICAL THERAPY TREATMENT   Patient Name: Lauren [name] MRN: [id number] DOB:1946/10/07, 78 y.o., female Today's Date: 09/05/2024  END OF SESSION:  PT End of Session - 09/05/24 1431     Visit Number 3    Number of Visits 16    Date for Recertification  10/16/24    Authorization Type [employer] $15 COPAY    Progress Note Due on Visit 10    PT Start Time 1433    PT Stop Time 1512    PT Time Calculation (min) 39 min    Activity Tolerance Patient tolerated treatment well;Patient limited by pain    Behavior During Therapy [facility] for tasks assessed/performed            Past Medical History:  Diagnosis Date   Anxiety    Arthritis    Back, Hip- right   Atypical chest pain 06/25/2021   Back pain    Cancer (HCC)    right breast   Cataract    Colitis    Coronary artery calcification 02/20/2020   Diabetes mellitus without complication (HCC)    Type II   Edema 03/05/2021   Edema of both lower extremities    Endometriosis    Family history of adverse reaction to anesthesia    Father - N/V - blood pressure; daughter N/V   Family history of breast cancer    Family history of colon cancer    Family history of kidney cancer    Family history of prostate cancer    Fatty liver    GERD (gastroesophageal reflux disease)    History of hiatal hernia    History of kidney stones    History of ulcerative colitis    Hypertension    Insomnia    Joint pain    Kidney problem    Lower extremity edema 03/05/2021   Lumbar disc disease    L4 L5   Neuropathy    Osteoarthritis    Other hyperlipidemia    Palpitations    Personal history of radiation therapy    Pneumonia    1983, 2003   Pure hypercholesterolemia 02/20/2020   PVCs (premature ventricular contractions)    benign   Umbilical hernia    Vitamin D  deficiency    Wears glasses    Past Surgical History:  Procedure Laterality Date   APPENDECTOMY     BREAST BIOPSY  08-13-2009  DR TSUEI   EXCISION LEFT  NIPPLE DUCT   BREAST EXCISIONAL BIOPSY Left    x2   BREAST EXCISIONAL BIOPSY Right    BREAST LUMPECTOMY Right 11/16/2018   invasive ductal   CHOLECYSTECTOMY  1992   COLON RESECTION  1986   ULCERATIVE COLITIS   COLON SURGERY     ESOPHAGEAL MANOMETRY N/A 06/11/2013   Procedure: ESOPHAGEAL MANOMETRY (EM);  Surgeon: Oliva FORBES Boots, MD;  Location: [facility] ENDOS[facility];  Service: Endoscopy;  Laterality: N/A;   ESOPHAGOGASTRODUODENOSCOPY (EGD) WITH PROPOFOL  N/A 05/31/2016   Procedure: ESOPHAGOGASTRODUODENOSCOPY (EGD) WITH PROPOFOL ;  Surgeon: Oliva Boots, MD;  Location: [facility] ENDOSCOPY;  Service: Endoscopy;  Laterality: N/A;   EXCISION RIGHT BREAST MASS   10-20-2005  DR MAUDE YOUNG   EYE SURGERY Bilateral 2023   cataract   FRACTURE SURGERY     left arm   kidney stone removed  12/2009   KNEE ARTHROSCOPY Left    MCL   LEFT THUMB CARPOMETACARPAL JOINT SUSPENSIONPLASTY  04-06-2006  DR SISSY   LEFT WRIST ARTHROSCOPY W/ DEBRIDEMENT AND REMOVAL CYST  03-26-2004  DR SISSY   MYOMECTOMY     RADIOACTIVE SEED GUIDED PARTIAL MASTECTOMY WITH AXILLARY SENTINEL LYMPH NODE BIOPSY Right 11/16/2018   Procedure: RIGHT BREAST RADIOACTIVE SEED GUIDED PARTIAL MASTECTOMY WITH  SENTINEL  NODE BIOPSY;  Surgeon: Vernetta Berg, MD;  Location: [facility] OR;  Service: General;  Laterality: Right;   RIGHT COLECTOMY     RIGHT URETEROSCOPIC STONE EXTRACTION  12-11-2009  DR NORLEEN [facility]   TENDON RELEASE     TONSILLECTOMY     TOTAL HIP ARTHROPLASTY Right 11/26/2016   Procedure: RIGHT TOTAL HIP ARTHROPLASTY ANTERIOR APPROACH;  Surgeon: Lonni CINDERELLA Vernetta, MD;  Location: [facility] ORS;  Service: Orthopedics;  Laterality: Right;   TOTAL HIP ARTHROPLASTY Left 12/06/2023   Procedure: LEFT TOTAL HIP ARTHROPLASTY ANTERIOR APPROACH;  Surgeon: Vernetta Lonni CINDERELLA, MD;  Location: [facility] OR;  Service: Orthopedics;  Laterality: Left;   TOTAL KNEE ARTHROPLASTY Left 12/02/2017   Procedure: LEFT TOTAL KNEE ARTHROPLASTY;  Surgeon: Vernetta Lonni CINDERELLA,  MD;  Location: [facility] ORS;  Service: Orthopedics;  Laterality: Left;   TOTAL KNEE ARTHROPLASTY Right 08/03/2022   Procedure: RIGHT TOTAL KNEE ARTHROPLASTY;  Surgeon: Vernetta Lonni CINDERELLA, MD;  Location: [facility] OR;  Service: Orthopedics;  Laterality: Right;   WRIST SURGERY     both   Patient Active Problem List   Diagnosis Date Noted   Vaginal atrophy 06/29/2024   History of iron deficiency 05/24/2024   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   Falls 05/01/2024   Iron deficiency 05/01/2024   Overactive bladder 02/22/2024   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/2023   SUI (stress urinary incontinence, female) 11/22/2023   RLS (restless legs syndrome) 09/22/2023   Other hyperlipidemia 06/08/2023   Unilateral primary osteoarthritis, left hip 05/11/2023   Depression 03/16/2023   Allergic rhinitis 12/29/2022   Seasonal affective disorder 12/01/2022   BMI 32.0-32.9,adult 12/01/2022   Obesity, Beginning BMI 37.59 12/01/2022   OA (osteoarthritis) of knee 08/03/2022   Status post total right knee replacement 08/03/2022   Stress 07/19/2022   Other constipation 07/19/2022   Atypical chest pain 06/25/2021   Class 2 severe obesity with serious comorbidity and body mass index (BMI) of 37.0 to 37.9 in adult 06/03/2021   Vitamin D  deficiency 06/03/2021   Other fatigue 04/22/2021   SOB (shortness of breath) on exertion 04/22/2021   Lower extremity edema 03/05/2021   Coronary artery calcification 02/20/2020   Pure hypercholesterolemia 02/20/2020   Unilateral primary osteoarthritis, right knee 01/03/2019   Genetic testing 12/07/2018   Family history of breast cancer    Family history of prostate cancer    Family history of colon cancer    Family history of kidney cancer    Malignant neoplasm of upper-outer quadrant of right breast in female, 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/09/2018   Diabetic neuropathy, type II diabetes mellitus (HCC) 11/09/2018   Status post total left knee replacement  12/02/2017   Trochanteric bursitis, right hip 05/10/2017   Status post total replacement of right hip 11/26/2016   DDD (degenerative disc disease), 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   Low back pain 04/23/2015   Hiatal hernia 04/13/2013   Diabetes mellitus (HCC) 01/26/2013   Umbilical hernia 07/24/2012   Essential hypertension     PCP: Carlin Dale Gull, MD   REFERRING PROVIDER: Lonell Sprang, DO  REFERRING DIAG: M76.11 (ICD-10-CM) - Psoas tendinitis of right side M25.551,G89.29,Z96.641 (ICD-10-CM) - Chronic hip pain after total replacement of right hip joint R26.89 (ICD-10-CM) - Abnormality of gait due to impairment of balance M25.551,M25.552 (ICD-10-CM) - Bilateral  hip pain  THERAPY DIAG:  Pain in right hip  Other low back pain  Muscle weakness (generalized)  Abnormal posture  Unsteadiness on feet  Other abnormalities of gait and mobility  Rationale for Evaluation and Treatment: Rehabilitation  ONSET DATE: following HHPT for TKA 3 years ago   SUBJECTIVE:   SUBJECTIVE STATEMENT: Patient reports no change in symptoms.  PERTINENT HISTORY: Patient has been experiencing Rt hip pain (mainly iliopsoas) and balance issues. Patient has received injections from Dr. Burnetta for iliopsoas pain that assisted with pain. Patient has undergone bilat TKA and bilat THA with no other injuries or surgeries to surrounding areas. Patient intermittently experiencing low back pain. Patient has experienced 5 falls in past year with 2 major falls leading to injuries; has since been cleared by medical team.  May need to check BP prior to sessions   See PMH or personal factors for in depth comorbidities    PAIN:  NPRS scale: 5/10 soreness/pain  Pain location: Rt hip  Pain description: sharp when moving, dull when sitting  Aggravating factors: stairs, into car, bed mobility/transfers  Relieving factors: heating pad, rest    PRECAUTIONS: Fall  RED FLAGS: Bowel or bladder incontinence: No and Cauda  equina syndrome: No   WEIGHT BEARING RESTRICTIONS: No  FALLS:  Has patient fallen in last 6 months? Yes. Number of falls 5; 2 caused injuries (one from a potential medication issue that has since been changed, had on clogs and landed flat with arms by side hitting face, bruised ribs, etc.)  LIVING ENVIRONMENT: Lives with: lives alone Lives in: House/apartment Stairs: Yes: Internal: 1 steps; on right going up Has following equipment at home: Single point cane, Walker - 2 wheeled, and Grab bars  OCCUPATION: retired   PLOF: Independent  PATIENT GOALS: be more independent, reduce pain to be more active, get down on the floor and back   NEXT MD VISIT: not scheduled with Dr. Burnetta, follow up with Dr. Vernetta Sep 05, 2024  OBJECTIVE:  Note: Objective measures were completed at Evaluation unless otherwise noted.  DIAGNOSTIC FINDINGS:  An AP pelvis shows bilateral total hip arthroplasties with no complicating  features.   PATIENT SURVEYS:  PSFS: THE PATIENT SPECIFIC FUNCTIONAL SCALE  Place score of 0-10 (0 = unable to perform activity and 10 = able to perform activity at the same level as before injury or problem)  Activity Date: 08/21/2024    Get in and out of car   1    2. Get in and out of bed   1    3. Go up and down steps   3    4.      Total Score 1.66      Total Score = Sum of activity scores/number of activities  Minimally Detectable Change: 3 points (for single activity); 2 points (for average score)  [facility] (nd). The Patient Specific Functional Scale . Retrieved from [email or web address]   COGNITION: 08/21/2024 Overall cognitive status: Within functional limits for tasks assessed     SENSATION: 08/21/2024 Not tested  EDEMA:  08/21/2024 Not tested   MUSCLE LENGTH: 08/21/2024 Not formally assessed secondary to pain   POSTURE:  08/21/2024 rounded shoulders, forward head, and increased  thoracic kyphosis  PALPATION: 08/21/2024 TTP at Rt iliopsoas, Rt lateral glutes, Rt posterior glutes   LOWER EXTREMITY ROM:  ROM Right Eval 08/21/2024 Left Eval 08/21/2024  Hip flexion AROM: 63deg PROM: ~95deg AROM: 110deg with back pain noted   Hip extension  Hip abduction    Hip adduction    Hip internal rotation    Hip external rotation    Knee flexion    Knee extension    Ankle dorsiflexion    Ankle plantarflexion    Ankle inversion    Ankle eversion     (Blank rows = not tested)  LOWER EXTREMITY MMT:  MMT Right Eval 08/21/2024 Left Eval 08/21/2024  Hip flexion (seated) 2+/5 3+/5  Hip extension (prone) 2+/5 3-/5  Hip abduction (sidelying) 2+/5 3+/5  Hip adduction    Hip internal rotation    Hip external rotation    Knee flexion(seated) 4+/5 5/5  Knee extension(seated) 3-/5 5/5  Ankle dorsiflexion(seated) 5/5 5/5  Ankle plantarflexion    Ankle inversion    Ankle eversion     (Blank rows = not tested)  LOWER EXTREMITY SPECIAL TESTS:  08/21/2024 No testing.    FUNCTIONAL TESTS:  08/21/2024 5 times sit to stand: 18.53s  GAIT: 08/21/2024 Distance walked: not formally assessed  Assistive device utilized: None Level of assistance: supervision  Comments: antalgic gait pattern    [facility] PT Assessment - 09/05/24 0001       Functional Gait  Assessment   Gait Level Surface Walks 20 ft, slow speed, abnormal gait pattern, evidence for imbalance or deviates 10-15 in outside of the 12 in walkway width. Requires more than 7 sec to ambulate 20 ft.    Change in Gait Speed Able to change speed, demonstrates mild gait deviations, deviates 6-10 in outside of the 12 in walkway width, or no gait deviations, unable to achieve a major change in velocity, or uses a change in velocity, or uses an assistive device.    Gait with Horizontal Head Turns Performs head turns with moderate changes in gait velocity, slows down, deviates 10-15 in outside 12 in walkway width but  recovers, can continue to walk.    Gait with Vertical Head Turns Performs task with slight change in gait velocity (eg, minor disruption to smooth gait path), deviates 6 - 10 in outside 12 in walkway width or uses assistive device    Gait and Pivot Turn Pivot turns safely in greater than 3 sec and stops with no loss of balance, or pivot turns safely within 3 sec and stops with mild imbalance, requires small steps to catch balance.    Step Over Obstacle Is able to step over one shoe box (4.5 in total height) but must slow down and adjust steps to clear box safely. May require verbal cueing.    Gait with Narrow Base of Support Ambulates less than 4 steps heel to toe or cannot perform without assistance.    Gait with Eyes Closed Cannot walk 20 ft without assistance, severe gait deviations or imbalance, deviates greater than 15 in outside 12 in walkway width or will not attempt task.    Ambulating Backwards Walks 20 ft, slow speed, abnormal gait pattern, evidence for imbalance, deviates 10-15 in outside 12 in walkway width.    Steps Alternating feet, must use rail.    Total Score 12    FGA comment: Max score 30  Interpretation: 25-28 = low risk fall   19-24 = medium risk fall  < 19 = high risk fall                             TREATMENT        DATE: 09/05/2024 TherEx:  UBE with bilat LE  only level 2 for 8 minutes  Tap down squats with 5# DB 2x10 with a decrease in table height after first round to increase difficulty   Physical Performance  FGA performed with intermittent CGA  Results discussed with patient and noted above    TREATMENT        DATE: 09/03/2024 Therex: UBE with bilat LE only level 2 for 8 minutes  Bilat leg press 3x10 with 75#   Neuro Re-ed: performed within the parallel bars with CGA throughout  Tandem stance 2x30s with each leg back; intermittent UE use throughout secondary to lateral LOB  Narrow stance with horizontal head turns 1x16 turns  Narrow stance with vertical  head turns 1x16 turns  Typical stance on airex 1x30s  Narrow stance on airex 1x30s  PT educated and discussed 3 balance systems, why tandem stance is more difficult and causes soreness with Rt LE back, how other comorbidities can affect balance, and how the balance system needs to be challenged just like muscles in order to strengthen    TREATMENT        DATE: 08/21/2024 TherEx:  HEP handout provided with patient performing one set of each activity for appropriate form. Verbal and tactile cues provided.  Self-Care:  POC, intervention options (dry needling, etc.), importance of overall strengthening and flexibility for pain     PATIENT EDUCATION:  Education details: HEP, POC, interventions, strength and flexibility  Person educated: Patient Education method: Explanation, Demonstration, Tactile cues, Verbal cues, and Handouts Education comprehension: verbalized understanding, returned demonstration, verbal cues required, and tactile cues required  HOME EXERCISE PROGRAM: Access Code: [email or web address] URL: [email or web address] Date: 08/21/2024 Prepared by: Susannah Daring  Exercises - Standing Hip Abduction with Counter Support  - 1 x daily - 7 x weekly - 2 sets - 5-10 reps - Standing Hip Extension with Counter Support  - 1 x daily - 7 x weekly - 2 sets - 5-10 reps - Mini Squat with Counter Support  - 1 x daily - 7 x weekly - 2 sets - 5-10 reps - Modified Thomas Stretch  - 1 x daily - 7 x weekly - 3 sets - 15-30s hold - Standing Hip Flexor Stretch  - 1 x daily - 7 x weekly - 3 sets - 15-30s hold  ASSESSMENT:  CLINICAL IMPRESSION: Patient arrived to session noting no change in symptoms. Patient tolerated all activities this date with only minor increases in soreness/pain with stairs during FGA as well as stepping over items. Patient is highly limited in static and functional balance and will require increased focus on those tasks in the future. Patient will continue to benefit from  skilled PT.  OBJECTIVE IMPAIRMENTS: Abnormal gait, decreased activity tolerance, decreased balance, decreased coordination, decreased endurance, decreased mobility, difficulty walking, decreased ROM, decreased strength, impaired flexibility, improper body mechanics, postural dysfunction, obesity, and pain.   ACTIVITY LIMITATIONS: bending, sitting, standing, squatting, sleeping, stairs, transfers, and bed mobility  PARTICIPATION LIMITATIONS: interpersonal relationship, community activity, and church  PERSONAL FACTORS: Past/current experiences, Time since onset of injury/illness/exacerbation, and 3+ comorbidities: anxiety, arthritis, cancer history, DM, GERD, HLD, HTN, kidney disease, ulcerative colitis, restless leg syndrome, lumbar disc disease  are also affecting patient's functional outcome.   REHAB POTENTIAL: Good  CLINICAL DECISION MAKING: Evolving/moderate complexity  EVALUATION COMPLEXITY: Moderate   GOALS: Goals reviewed with patient? Yes  SHORT TERM GOALS: Target date: 09/11/2024 Patient will show compliance with initial HEP.  Baseline: Goal status: INITIAL  2.  Patient will report pain levels no  greater than 7/10 to show overall improved quality of life. Baseline:  Goal status: INITIAL   LONG TERM GOALS: Target date: 10/16/2024  Patient will be independent with final HEP in order to maintain and progress upon functional gains made within PT. Baseline:  Goal status: INITIAL  2.  Patient will report pain levels no greater than 4/10 to show overall improved quality of life. Baseline:  Goal status: INITIAL  3.  Patient will increase PSFS to at least 3.66 in order to show a significant improvement in subjective disability rating. Baseline:  Goal status: INITIAL  4.  Patient will increase bilat hip abduction strength to at least 4-/5 in order to improve biomechanics with functional mobility. Baseline:  Goal status: INITIAL  5.  Patient will increase bilat hip  extension strength to at least 3+/5 in order to improve biomechanics with functional mobility. Baseline:  Goal status: INITIAL  6.  Patient will increase Rt hip flexion AROM to at least 90deg in order to improve functional mobility. Baseline:  Goal status: INITIAL  7.  Patient will increase 5xSTS to at least 16.23s in order to decrease chances of falling. Baseline:  Goal status: INITIAL   PLAN:  PT FREQUENCY: 1-2x/week  PT DURATION: 8 weeks  PLANNED INTERVENTIONS: [id number]- PT Re-evaluation, 97750- Physical Performance Testing, 97110-Therapeutic exercises, 97530- Therapeutic activity, [id number]- Neuromuscular re-education, 97535- Self Care, [id number]- Manual therapy, [id number]- Gait training, [phone]- Orthotic Initial, [phone]- Orthotic/Prosthetic subsequent, [phone]- Canalith repositioning, [phone]- Aquatic Therapy, [phone]- Electrical stimulation (unattended), [phone]- Electrical stimulation (manual), [id number]- Vasopneumatic device, [id number]- Ultrasound, [id number]- Traction (mechanical), [id number]- Ionotophoresis 4mg /ml Dexamethasone , [id number] (1-2 muscles), [id number] (3+ muscles)- Dry Needling, Patient/Family education, Balance training, Stair training, Taping, Joint mobilization, Joint manipulation, Spinal manipulation, Spinal mobilization, Scar mobilization, Compression bandaging, Vestibular training, DME instructions, Cryotherapy, and Moist heat  PLAN FOR NEXT SESSION:   hip flexor stretching, general hip/LE strengthening, balance (static and functional)   Susannah Daring, PT, DPT 09/05/24 3:30 PM

## 2024-09-05 ENCOUNTER — Encounter: Payer: Self-pay | Admitting: Orthopaedic Surgery

## 2024-09-05 ENCOUNTER — Ambulatory Visit

## 2024-09-05 ENCOUNTER — Ambulatory Visit: Admitting: Orthopaedic Surgery

## 2024-09-05 DIAGNOSIS — R293 Abnormal posture: Secondary | ICD-10-CM

## 2024-09-05 DIAGNOSIS — Z96641 Presence of right artificial hip joint: Secondary | ICD-10-CM

## 2024-09-05 DIAGNOSIS — Z96642 Presence of left artificial hip joint: Secondary | ICD-10-CM

## 2024-09-05 DIAGNOSIS — M5459 Other low back pain: Secondary | ICD-10-CM

## 2024-09-05 DIAGNOSIS — M6281 Muscle weakness (generalized): Secondary | ICD-10-CM

## 2024-09-05 DIAGNOSIS — R2681 Unsteadiness on feet: Secondary | ICD-10-CM

## 2024-09-05 DIAGNOSIS — R2689 Other abnormalities of gait and mobility: Secondary | ICD-10-CM

## 2024-09-05 DIAGNOSIS — M25551 Pain in right hip: Secondary | ICD-10-CM | POA: Diagnosis not present

## 2024-09-05 NOTE — Progress Notes (Signed)
 Lauren [name] comes in today after having a steroid injection a month ago in the right hip iliopsoas tendon area by Dr. Burnetta under ultrasound.  She is going to see a soft tissue therapy specialist starting this Thursday and this is someone who Dr. Burnetta was recommended and we have had some other patients that have gone there successfully.  We have replaced both of her knees and both of her hips with the left hip being done in February of this year.  She is walk without assistive device.  She is a very active and young appearing 57.  She says the injection has been helpful and she is doing better.  She still does have pain in the anterior hip on the right side but it is definitely less than what it has been.  I agree with her going to the special therapist for soft tissue management.  We will see her back in 3 months with a standing AP pelvis.  We talked about the possibility of an open capsular debridement of the right hip if her pain persists.

## 2024-09-06 ENCOUNTER — Ambulatory Visit (INDEPENDENT_AMBULATORY_CARE_PROVIDER_SITE_OTHER): Payer: Self-pay | Admitting: Family Medicine

## 2024-09-06 ENCOUNTER — Encounter

## 2024-09-06 ENCOUNTER — Encounter (INDEPENDENT_AMBULATORY_CARE_PROVIDER_SITE_OTHER): Payer: Self-pay | Admitting: Family Medicine

## 2024-09-06 VITALS — BP 106/71 | HR 68 | Temp 97.3°F | Ht 63.0 in | Wt 176.0 lb

## 2024-09-06 DIAGNOSIS — J3089 Other allergic rhinitis: Secondary | ICD-10-CM | POA: Diagnosis not present

## 2024-09-06 DIAGNOSIS — F5089 Other specified eating disorder: Secondary | ICD-10-CM | POA: Diagnosis not present

## 2024-09-06 DIAGNOSIS — E669 Obesity, unspecified: Secondary | ICD-10-CM

## 2024-09-06 DIAGNOSIS — F32A Depression, unspecified: Secondary | ICD-10-CM

## 2024-09-06 DIAGNOSIS — F3289 Other specified depressive episodes: Secondary | ICD-10-CM

## 2024-09-06 DIAGNOSIS — Z6831 Body mass index (BMI) 31.0-31.9, adult: Secondary | ICD-10-CM

## 2024-09-06 DIAGNOSIS — E1140 Type 2 diabetes mellitus with diabetic neuropathy, unspecified: Secondary | ICD-10-CM

## 2024-09-06 DIAGNOSIS — E782 Mixed hyperlipidemia: Secondary | ICD-10-CM

## 2024-09-06 DIAGNOSIS — Z7985 Long-term (current) use of injectable non-insulin antidiabetic drugs: Secondary | ICD-10-CM | POA: Diagnosis not present

## 2024-09-06 MED ORDER — ESCITALOPRAM OXALATE 20 MG PO TABS: 20.0000 mg | ORAL_TABLET | Freq: Every day | ORAL | 0 refills | Status: DC

## 2024-09-06 MED ORDER — TIRZEPATIDE 7.5 MG/0.5ML ~~LOC~~ SOAJ: 7.5000 mg | SUBCUTANEOUS | 0 refills | Status: DC

## 2024-09-06 MED ORDER — ROSUVASTATIN CALCIUM 20 MG PO TABS: 20.0000 mg | ORAL_TABLET | Freq: Every day | ORAL | 0 refills | Status: DC

## 2024-09-06 MED ORDER — FLUTICASONE PROPIONATE 50 MCG/ACT NA SUSP: 2.0000 | Freq: Every day | NASAL | 0 refills | Status: DC | PRN

## 2024-09-06 NOTE — Progress Notes (Signed)
 Office: [phone]  /  Fax: [phone]  WEIGHT SUMMARY AND BIOMETRICS  Anthropometric Measurements Height: 5' 3 (1.6 m) Weight: 176 lb (79.8 kg) BMI (Calculated): 31.18 Weight at Last Visit: 176 lb Weight Lost Since Last Visit: 0 Weight Gained Since Last Visit: 0 Starting Weight: 219 lb Total Weight Loss (lbs): 43 lb (19.5 kg) Peak Weight: 222 lb   Body Composition  Body Fat %: 47.5 % Fat Mass (lbs): 83.6 lbs Muscle Mass (lbs): 87.6 lbs Total Body Water  (lbs): 70.4 lbs Visceral Fat Rating : 14   Other Clinical Data Fasting: no Labs: no Today's Visit #: 41 Starting Date: 04/22/21    Chief Complaint: OBESITY   History of Present Illness Lauren [name] is a 78 year old female who presents for obesity treatment and progress assessment.  She is following the category two eating plan about forty percent of the time, with a noted lack of fruits, vegetables, and protein in her diet. She is attempting to stay hydrated and engages in physical therapy for exercise, attending sessions twice a week for forty-five minutes each. Her weight has remained stable over the last month since her last visit.  She is being treated for type two diabetes with Mounjaro  7.5 mg weekly and requests a refill. She is also managing hyperlipidemia with Crestor  20 mg daily and is working on reducing cholesterol-rich foods in her diet.  She uses Flonase , two sprays in each nostril daily, for allergic rhinitis and requests a refill. She has a history of emotional eating behaviors, which are being managed with Lexapro  20 mg daily, and she requests a refill.  She has a history of restless leg syndrome, which occurs while watching TV at night. She underwent a sleep study that revealed mild sleep apnea and episodes of her heart rate dropping to 85 bpm at night. She is in the process of obtaining insurance approval for a CPAP machine.  She has experienced five falls this year, which is why  she is undergoing physical therapy to improve her core strength and balance.  She wakes up tired despite adequate sleep duration.      PHYSICAL EXAM:  Blood pressure 106/71, pulse 68, temperature (!) 97.3 F (36.3 C), height 5' 3 (1.6 m), weight 176 lb (79.8 kg), SpO2 98%. Body mass index is 31.18 kg/m.  DIAGNOSTIC DATA REVIEWED:  BMET    Component Value Date/Time   NA 140 05/24/2024 1032   K 3.5 05/24/2024 1032   CL 101 05/24/2024 1032   CO2 21 05/24/2024 1032   GLUCOSE 68 (L) 05/24/2024 1032   GLUCOSE 118 (H) 12/07/2023 0736   BUN 7 (L) 05/24/2024 1032   CREATININE 0.81 05/24/2024 1032   CREATININE 0.87 06/08/2022 1439   CALCIUM  9.3 05/24/2024 1032   GFRNONAA 60 (L) 12/07/2023 0736   GFRNONAA >60 06/08/2022 1439   GFRAA >60 02/04/2020 1212   GFRAA >60 11/09/2018 1405   Lab Results  Component Value Date   HGBA1C 6.0 (H) 06/08/2023   HGBA1C 6.7 (H) 11/16/2016   Lab Results  Component Value Date   INSULIN  25.6 (H) 06/08/2023   INSULIN  22.3 04/22/2021   Lab Results  Component Value Date   TSH 2.450 04/11/2024   CBC    Component Value Date/Time   WBC 10.9 (H) 11/30/2023 1347   RBC 4.25 11/30/2023 1347   HGB 12.4 11/30/2023 1347   HGB 12.3 06/08/2022 1439   HGB 11.8 05/11/2022 0954   HCT 37.5 11/30/2023 1347  HCT 35.1 05/11/2022 0954   PLT 327 11/30/2023 1347   PLT 272 06/08/2022 1439   PLT 265 05/11/2022 0954   MCV 88.2 11/30/2023 1347   MCV 87 05/11/2022 0954   MCH 29.2 11/30/2023 1347   MCHC 33.1 11/30/2023 1347   RDW 13.3 11/30/2023 1347   RDW 13.2 05/11/2022 0954   Iron Studies    Component Value Date/Time   IRON 61 06/28/2024 0918   TIBC 292 06/28/2024 0918   FERRITIN 82 06/28/2024 0918   IRONPCTSAT 21 06/28/2024 0918   Lipid Panel     Component Value Date/Time   CHOL 123 06/08/2023 0941   TRIG 75 06/08/2023 0941   HDL 67 06/08/2023 0941   LDLCALC 41 06/08/2023 0941   Hepatic Function Panel     Component Value Date/Time   PROT  6.6 05/24/2024 1032   ALBUMIN  4.3 05/24/2024 1032   AST 17 05/24/2024 1032   AST 16 06/08/2022 1439   ALT 12 05/24/2024 1032   ALT 12 06/08/2022 1439   ALKPHOS 72 05/24/2024 1032   BILITOT 0.4 05/24/2024 1032   BILITOT 0.4 06/08/2022 1439   BILIDIR <0.1 (L) 07/30/2015 1338   IBILI NOT CALCULATED 07/30/2015 1338      Component Value Date/Time   TSH 2.450 04/11/2024 1208   TSH 2.150 05/11/2022 0954   Nutritional Lab Results  Component Value Date   VD25OH 57.5 02/07/2024   VD25OH 124.0 (H) 09/22/2023   VD25OH 100.0 06/08/2023     Assessment and Plan Assessment & Plan Obesity Management is ongoing with a category two eating plan, followed approximately 40% of the time. She is not consuming enough fruits, vegetables, or protein, but is hydrating well. Engages in physical therapy for 45 minutes, twice a week. Weight has been maintained over the last month. - Encouraged increased protein intake to support physical therapy efforts. - Continue physical therapy sessions twice a week.  Type 2 diabetes mellitus with diabetic neuropathy Type 2 diabetes is managed with Mounjaro  7.5 mg weekly. She requests a refill of this medication. - Refilled Mounjaro  7.5 mg weekly.  Mixed hyperlipidemia Managed with Crestor  20 mg daily. She is working on reducing cholesterol-rich foods in her diet. - Continue Crestor  20 mg daily.  Allergic rhinitis Managed with Flonase , two sprays in each nostril daily. She requests a refill. - Refilled Flonase , two sprays in each nostril daily.  Emotional eating behaviors and depression Emotional eating behaviors are managed with Lexapro  20 mg daily. She requests a refill and is considering alternative methods to manage tension during the holidays. - Refilled Lexapro  20 mg daily.  History of falls She has experienced five falls this year and is undergoing physical therapy to improve core strength and balance. - Continue physical therapy to improve core  strength and balance.   Jalexus was counseled on the importance of maintaining healthy lifestyle habits, including balanced nutrition, regular physical activity, and behavioral modifications, while taking antiobesity medication.  Patient verbalized understanding that medication is an adjunct to, not a replacement for, lifestyle changes and that the long-term success and weight maintenance depend on continued adherence to these strategies.   Asyah was informed of the importance of frequent follow up visits to maximize her success with intensive lifestyle modifications for her obesity and obesity related health conditions as recommended by USPSTF and CMS guidelines   Louann Penton, MD

## 2024-09-06 NOTE — Addendum Note (Signed)
 Addended by: LAFE BAKER CROME on: 09/06/2024 03:39 PM   Modules accepted: Level of Service

## 2024-09-07 NOTE — Therapy (Signed)
 OUTPATIENT PHYSICAL THERAPY TREATMENT   Patient Name: Lauren [name] MRN: [id number] DOB:[birth year], 78 y.o., female Today's Date: 09/10/2024  END OF SESSION:  PT End of Session - 09/10/24 1025     Visit Number 4    Number of Visits 16    Date for Recertification  10/16/24    Authorization Type [employer] $15 COPAY    Progress Note Due on Visit 10    PT Start Time 1017    PT Stop Time 1102    PT Time Calculation (min) 45 min    Activity Tolerance Patient tolerated treatment well;Patient limited by pain    Behavior During Therapy [facility] for tasks assessed/performed           Past Medical History:  Diagnosis Date   Anxiety    Arthritis    Back, Hip- right   Atypical chest pain 06/25/2021   Back pain    Cancer (HCC)    right breast   Cataract    Colitis    Coronary artery calcification 02/20/2020   Diabetes mellitus without complication (HCC)    Type II   Edema 03/05/2021   Edema of both lower extremities    Endometriosis    Family history of adverse reaction to anesthesia    Father - N/V - blood pressure; daughter N/V   Family history of breast cancer    Family history of colon cancer    Family history of kidney cancer    Family history of prostate cancer    Fatty liver    GERD (gastroesophageal reflux disease)    History of hiatal hernia    History of kidney stones    History of ulcerative colitis    Hypertension    Insomnia    Joint pain    Kidney problem    Lower extremity edema 03/05/2021   Lumbar disc disease    L4 L5   Neuropathy    Osteoarthritis    Other hyperlipidemia    Palpitations    Personal history of radiation therapy    Pneumonia    1983, 2003   Pure hypercholesterolemia 02/20/2020   PVCs (premature ventricular contractions)    benign   Umbilical hernia    Vitamin D  deficiency    Wears glasses    Past Surgical History:  Procedure Laterality Date   APPENDECTOMY     BREAST BIOPSY  08-13-2009  DR TSUEI   EXCISION LEFT NIPPLE  DUCT   BREAST EXCISIONAL BIOPSY Left    x2   BREAST EXCISIONAL BIOPSY Right    BREAST LUMPECTOMY Right 11/16/2018   invasive ductal   CHOLECYSTECTOMY  1992   COLON RESECTION  1986   ULCERATIVE COLITIS   COLON SURGERY     ESOPHAGEAL MANOMETRY N/A 06/11/2013   Procedure: ESOPHAGEAL MANOMETRY (EM);  Surgeon: Oliva FORBES Boots, MD;  Location: [facility];  Service: Endoscopy;  Laterality: N/A;   ESOPHAGOGASTRODUODENOSCOPY (EGD) WITH PROPOFOL  N/A 05/31/2016   Procedure: ESOPHAGOGASTRODUODENOSCOPY (EGD) WITH PROPOFOL ;  Surgeon: Oliva Boots, MD;  Location: [facility] ENDOSCOPY;  Service: Endoscopy;  Laterality: N/A;   EXCISION RIGHT BREAST MASS   10-20-2005  DR MAUDE YOUNG   EYE SURGERY Bilateral 2023   cataract   FRACTURE SURGERY     left arm   kidney stone removed  12/2009   KNEE ARTHROSCOPY Left    MCL   LEFT THUMB CARPOMETACARPAL JOINT SUSPENSIONPLASTY  04-06-2006  DR SISSY   LEFT WRIST ARTHROSCOPY W/ DEBRIDEMENT AND REMOVAL CYST  03-26-2004  DR  WEINGOLD   MYOMECTOMY     RADIOACTIVE SEED GUIDED PARTIAL MASTECTOMY WITH AXILLARY SENTINEL LYMPH NODE BIOPSY Right 11/16/2018   Procedure: RIGHT BREAST RADIOACTIVE SEED GUIDED PARTIAL MASTECTOMY WITH  SENTINEL  NODE BIOPSY;  Surgeon: Vernetta Berg, MD;  Location: [facility] OR;  Service: General;  Laterality: Right;   RIGHT COLECTOMY     RIGHT URETEROSCOPIC STONE EXTRACTION  12-11-2009  DR NORLEEN [facility]   TENDON RELEASE     TONSILLECTOMY     TOTAL HIP ARTHROPLASTY Right 11/26/2016   Procedure: RIGHT TOTAL HIP ARTHROPLASTY ANTERIOR APPROACH;  Surgeon: Lonni CINDERELLA Vernetta, MD;  Location: [facility] ORS;  Service: Orthopedics;  Laterality: Right;   TOTAL HIP ARTHROPLASTY Left 12/06/2023   Procedure: LEFT TOTAL HIP ARTHROPLASTY ANTERIOR APPROACH;  Surgeon: Vernetta Lonni CINDERELLA, MD;  Location: [facility] OR;  Service: Orthopedics;  Laterality: Left;   TOTAL KNEE ARTHROPLASTY Left 12/02/2017   Procedure: LEFT TOTAL KNEE ARTHROPLASTY;  Surgeon: Vernetta Lonni CINDERELLA, MD;   Location: [facility] OR[facility];  Service: Orthopedics;  Laterality: Left;   TOTAL KNEE ARTHROPLASTY Right 08/03/2022   Procedure: RIGHT TOTAL KNEE ARTHROPLASTY;  Surgeon: Vernetta Lonni CINDERELLA, MD;  Location: [facility] OR;  Service: Orthopedics;  Laterality: Right;   WRIST SURGERY     both   Patient Active Problem List   Diagnosis Date Noted   Vaginal atrophy 06/29/2024   History of iron deficiency 05/24/2024   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   Falls 05/01/2024   Iron deficiency 05/01/2024   Overactive bladder 02/22/2024   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/2023   SUI (stress urinary incontinence, female) 11/22/2023   RLS (restless legs syndrome) 09/22/2023   Other hyperlipidemia 06/08/2023   Unilateral primary osteoarthritis, left hip 05/11/2023   Depression 03/16/2023   Allergic rhinitis 12/29/2022   Seasonal affective disorder 12/01/2022   BMI 32.0-32.9,adult 12/01/2022   Obesity, Beginning BMI 37.59 12/01/2022   OA (osteoarthritis) of knee 08/03/2022   Status post total right knee replacement 08/03/2022   Stress 07/19/2022   Other constipation 07/19/2022   Atypical chest pain 06/25/2021   Class 2 severe obesity with serious comorbidity and body mass index (BMI) of 37.0 to 37.9 in adult 06/03/2021   Vitamin D  deficiency 06/03/2021   Other fatigue 04/22/2021   SOB (shortness of breath) on exertion 04/22/2021   Lower extremity edema 03/05/2021   Coronary artery calcification 02/20/2020   Pure hypercholesterolemia 02/20/2020   Unilateral primary osteoarthritis, right knee 01/03/2019   Genetic testing 12/07/2018   Family history of breast cancer    Family history of prostate cancer    Family history of colon cancer    Family history of kidney cancer    Malignant neoplasm of upper-outer quadrant of right breast in female, 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/09/2018   Diabetic neuropathy, type II diabetes mellitus (HCC) 11/09/2018   Status post total left knee replacement  12/02/2017   Trochanteric bursitis, right hip 05/10/2017   Status post total replacement of right hip 11/26/2016   DDD (degenerative disc disease), 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   Low back pain 04/23/2015   Hiatal hernia 04/13/2013   Diabetes mellitus (HCC) 01/26/2013   Umbilical hernia 07/24/2012   Essential hypertension     PCP: Carlin Dale Gull, MD   REFERRING PROVIDER: Lonell Sprang, DO  REFERRING DIAG: M76.11 (ICD-10-CM) - Psoas tendinitis of right side M25.551,G89.29,Z96.641 (ICD-10-CM) - Chronic hip pain after total replacement of right hip joint R26.89 (ICD-10-CM) - Abnormality of gait due to impairment of balance M25.551,M25.552 (ICD-10-CM) - Bilateral hip  pain  THERAPY DIAG:  Pain in right hip  Other low back pain  Muscle weakness (generalized)  Abnormal posture  Unsteadiness on feet  Other abnormalities of gait and mobility  Rationale for Evaluation and Treatment: Rehabilitation  ONSET DATE: following HHPT for TKA 3 years ago   SUBJECTIVE:   SUBJECTIVE STATEMENT: Patient reports having seen a cash based PT who did dry needling and massage.  PERTINENT HISTORY: Patient has been experiencing Rt hip pain (mainly iliopsoas) and balance issues. Patient has received injections from Dr. Burnetta for iliopsoas pain that assisted with pain. Patient has undergone bilat TKA and bilat THA with no other injuries or surgeries to surrounding areas. Patient intermittently experiencing low back pain. Patient has experienced 5 falls in past year with 2 major falls leading to injuries; has since been cleared by medical team.  May need to check BP prior to sessions   See PMH or personal factors for in depth comorbidities    PAIN:  NPRS scale: 3.5-5/10 soreness/pain  Pain location: Rt hip  Pain description: sharp when moving, dull when sitting  Aggravating factors: stairs, into car, bed mobility/transfers  Relieving factors: heating pad, rest    PRECAUTIONS: Fall  RED  FLAGS: Bowel or bladder incontinence: No and Cauda equina syndrome: No   WEIGHT BEARING RESTRICTIONS: No  FALLS:  Has patient fallen in last 6 months? Yes. Number of falls 5; 2 caused injuries (one from a potential medication issue that has since been changed, had on clogs and landed flat with arms by side hitting face, bruised ribs, etc.)  LIVING ENVIRONMENT: Lives with: lives alone Lives in: House/apartment Stairs: Yes: Internal: 1 steps; on right going up Has following equipment at home: Single point cane, Walker - 2 wheeled, and Grab bars  OCCUPATION: retired   PLOF: Independent  PATIENT GOALS: be more independent, reduce pain to be more active, get down on the floor and back   NEXT MD VISIT: not scheduled with Dr. Burnetta, follow up with Dr. Vernetta Sep 05, 2024  OBJECTIVE:  Note: Objective measures were completed at Evaluation unless otherwise noted.  DIAGNOSTIC FINDINGS:  An AP pelvis shows bilateral total hip arthroplasties with no complicating  features.   PATIENT SURVEYS:  PSFS: THE PATIENT SPECIFIC FUNCTIONAL SCALE  Place score of 0-10 (0 = unable to perform activity and 10 = able to perform activity at the same level as before injury or problem)  Activity Date: 08/21/2024    Get in and out of car   1    2. Get in and out of bed   1    3. Go up and down steps   3    4.      Total Score 1.66      Total Score = Sum of activity scores/number of activities  Minimally Detectable Change: 3 points (for single activity); 2 points (for average score)  [facility] (nd). The Patient Specific Functional Scale . Retrieved from [email or web address]   COGNITION: 08/21/2024 Overall cognitive status: Within functional limits for tasks assessed     SENSATION: 08/21/2024 Not tested  EDEMA:  08/21/2024 Not tested   MUSCLE LENGTH: 08/21/2024 Not formally assessed secondary to pain   POSTURE:   08/21/2024 rounded shoulders, forward head, and increased thoracic kyphosis  PALPATION: 08/21/2024 TTP at Rt iliopsoas, Rt lateral glutes, Rt posterior glutes   LOWER EXTREMITY ROM:  ROM Right Eval 08/21/2024 Left Eval 08/21/2024  Hip flexion AROM: 63deg PROM: ~95deg AROM: 110deg with  back pain noted   Hip extension    Hip abduction    Hip adduction    Hip internal rotation    Hip external rotation    Knee flexion    Knee extension    Ankle dorsiflexion    Ankle plantarflexion    Ankle inversion    Ankle eversion     (Blank rows = not tested)  LOWER EXTREMITY MMT:  MMT Right Eval 08/21/2024 Left Eval 08/21/2024  Hip flexion (seated) 2+/5 3+/5  Hip extension (prone) 2+/5 3-/5  Hip abduction (sidelying) 2+/5 3+/5  Hip adduction    Hip internal rotation    Hip external rotation    Knee flexion(seated) 4+/5 5/5  Knee extension(seated) 3-/5 5/5  Ankle dorsiflexion(seated) 5/5 5/5  Ankle plantarflexion    Ankle inversion    Ankle eversion     (Blank rows = not tested)  LOWER EXTREMITY SPECIAL TESTS:  08/21/2024 No testing.    FUNCTIONAL TESTS:  08/21/2024 5 times sit to stand: 18.53s  GAIT: 08/21/2024 Distance walked: not formally assessed  Assistive device utilized: None Level of assistance: supervision  Comments: antalgic gait pattern    [facility] PT Assessment - 09/05/24 0001       Functional Gait  Assessment   Gait Level Surface Walks 20 ft, slow speed, abnormal gait pattern, evidence for imbalance or deviates 10-15 in outside of the 12 in walkway width. Requires more than 7 sec to ambulate 20 ft.    Change in Gait Speed Able to change speed, demonstrates mild gait deviations, deviates 6-10 in outside of the 12 in walkway width, or no gait deviations, unable to achieve a major change in velocity, or uses a change in velocity, or uses an assistive device.    Gait with Horizontal Head Turns Performs head turns with moderate changes in gait velocity,  slows down, deviates 10-15 in outside 12 in walkway width but recovers, can continue to walk.    Gait with Vertical Head Turns Performs task with slight change in gait velocity (eg, minor disruption to smooth gait path), deviates 6 - 10 in outside 12 in walkway width or uses assistive device    Gait and Pivot Turn Pivot turns safely in greater than 3 sec and stops with no loss of balance, or pivot turns safely within 3 sec and stops with mild imbalance, requires small steps to catch balance.    Step Over Obstacle Is able to step over one shoe box (4.5 in total height) but must slow down and adjust steps to clear box safely. May require verbal cueing.    Gait with Narrow Base of Support Ambulates less than 4 steps heel to toe or cannot perform without assistance.    Gait with Eyes Closed Cannot walk 20 ft without assistance, severe gait deviations or imbalance, deviates greater than 15 in outside 12 in walkway width or will not attempt task.    Ambulating Backwards Walks 20 ft, slow speed, abnormal gait pattern, evidence for imbalance, deviates 10-15 in outside 12 in walkway width.    Steps Alternating feet, must use rail.    Total Score 12    FGA comment: Max score 30  Interpretation: 25-28 = low risk fall   19-24 = medium risk fall  < 19 = high risk fall                             TREATMENT  DATE: 09/10/2024 TherEx:  UBE with bilat LE only level 2 for 8 minutes  Thomas/hip flexor stretch on mat table 3x30s with PT applying slight STM to proximal quad/hip flexor  Attempted prone on elbows, though no stretch in hip flexor noted; held for 30s  Attempted sidelying Rt hip flexion with patient noting difficulty/slight pain, though increased tightness noted in Rt hip extensors  Supine single knee to chest 3x30s ; patient noting highly uncomfortable but not painful to do eccentric motion of return foot to table  Extension of Lt knee during last trial leading to increased pain in ant Rt hip  then patient endorsing that she is starting to experience very mild pain in Lt ant hip similar to what she is experiencing in Rt ant hip   TherAct:  Step over and back with small hurdle using bilat UE support 2x12 with Lt LE only  Step taps to 2 step with Rt LE only 2x12 with unilat UE support and intermittently attempting no UE support    TREATMENT        DATE: 09/05/2024 TherEx:  UBE with bilat LE only level 2 for 8 minutes  Tap down squats with 5# DB 2x10 with a decrease in table height after first round to increase difficulty   Physical Performance  FGA performed with intermittent CGA  Results discussed with patient and noted above    TREATMENT        DATE: 09/03/2024 Therex: UBE with bilat LE only level 2 for 8 minutes  Bilat leg press 3x10 with 75#   Neuro Re-ed: performed within the parallel bars with CGA throughout  Tandem stance 2x30s with each leg back; intermittent UE use throughout secondary to lateral LOB  Narrow stance with horizontal head turns 1x16 turns  Narrow stance with vertical head turns 1x16 turns  Typical stance on airex 1x30s  Narrow stance on airex 1x30s  PT educated and discussed 3 balance systems, why tandem stance is more difficult and causes soreness with Rt LE back, how other comorbidities can affect balance, and how the balance system needs to be challenged just like muscles in order to strengthen    TREATMENT        DATE: 08/21/2024 TherEx:  HEP handout provided with patient performing one set of each activity for appropriate form. Verbal and tactile cues provided.  Self-Care:  POC, intervention options (dry needling, etc.), importance of overall strengthening and flexibility for pain     PATIENT EDUCATION:  Education details: HEP, POC, interventions, strength and flexibility  Person educated: Patient Education method: Explanation, Demonstration, Tactile cues, Verbal cues, and Handouts Education comprehension: verbalized understanding,  returned demonstration, verbal cues required, and tactile cues required  HOME EXERCISE PROGRAM: Access Code: [email or web address] URL: [email or web address] Date: 08/21/2024 Prepared by: Susannah Daring  Exercises - Standing Hip Abduction with Counter Support  - 1 x daily - 7 x weekly - 2 sets - 5-10 reps - Standing Hip Extension with Counter Support  - 1 x daily - 7 x weekly - 2 sets - 5-10 reps - Mini Squat with Counter Support  - 1 x daily - 7 x weekly - 2 sets - 5-10 reps - Modified Thomas Stretch  - 1 x daily - 7 x weekly - 3 sets - 15-30s hold - Standing Hip Flexor Stretch  - 1 x daily - 7 x weekly - 3 sets - 15-30s hold  ASSESSMENT:  CLINICAL IMPRESSION: Patient arrived to session noting similar symptoms. Patient tolerated all activities with  increase in discomfort/pain with step up hurdles and when activating Rt hip flexor. At the end of session, patient noted pain in Lt hip that is very mild, but is starting to feel similar to what she is experiencing in Rt hip.  Patient will continue to benefit from skilled PT.  OBJECTIVE IMPAIRMENTS: Abnormal gait, decreased activity tolerance, decreased balance, decreased coordination, decreased endurance, decreased mobility, difficulty walking, decreased ROM, decreased strength, impaired flexibility, improper body mechanics, postural dysfunction, obesity, and pain.   ACTIVITY LIMITATIONS: bending, sitting, standing, squatting, sleeping, stairs, transfers, and bed mobility  PARTICIPATION LIMITATIONS: interpersonal relationship, community activity, and church  PERSONAL FACTORS: Past/current experiences, Time since onset of injury/illness/exacerbation, and 3+ comorbidities: anxiety, arthritis, cancer history, DM, GERD, HLD, HTN, kidney disease, ulcerative colitis, restless leg syndrome, lumbar disc disease  are also affecting patient's functional outcome.   REHAB POTENTIAL: Good  CLINICAL DECISION MAKING: Evolving/moderate  complexity  EVALUATION COMPLEXITY: Moderate   GOALS: Goals reviewed with patient? Yes  SHORT TERM GOALS: Target date: 09/11/2024 Patient will show compliance with initial HEP.  Baseline: Goal status: INITIAL  2.  Patient will report pain levels no greater than 7/10 to show overall improved quality of life. Baseline:  Goal status: INITIAL   LONG TERM GOALS: Target date: 10/16/2024  Patient will be independent with final HEP in order to maintain and progress upon functional gains made within PT. Baseline:  Goal status: INITIAL  2.  Patient will report pain levels no greater than 4/10 to show overall improved quality of life. Baseline:  Goal status: INITIAL  3.  Patient will increase PSFS to at least 3.66 in order to show a significant improvement in subjective disability rating. Baseline:  Goal status: INITIAL  4.  Patient will increase bilat hip abduction strength to at least 4-/5 in order to improve biomechanics with functional mobility. Baseline:  Goal status: INITIAL  5.  Patient will increase bilat hip extension strength to at least 3+/5 in order to improve biomechanics with functional mobility. Baseline:  Goal status: INITIAL  6.  Patient will increase Rt hip flexion AROM to at least 90deg in order to improve functional mobility. Baseline:  Goal status: INITIAL  7.  Patient will increase 5xSTS to at least 16.23s in order to decrease chances of falling. Baseline:  Goal status: INITIAL   PLAN:  PT FREQUENCY: 1-2x/week  PT DURATION: 8 weeks  PLANNED INTERVENTIONS: [id number]- PT Re-evaluation, 97750- Physical Performance Testing, 97110-Therapeutic exercises, 97530- Therapeutic activity, [id number]- Neuromuscular re-education, 97535- Self Care, [id number]- Manual therapy, [id number]- Gait training, [phone]- Orthotic Initial, [phone]- Orthotic/Prosthetic subsequent, [phone]- Canalith repositioning, [phone]- Aquatic Therapy, [phone]- Electrical stimulation (unattended), [phone]- Electrical  stimulation (manual), [id number]- Vasopneumatic device, [id number]- Ultrasound, [id number]- Traction (mechanical), [id number]- Ionotophoresis 4mg /ml Dexamethasone , [id number] (1-2 muscles), [id number] (3+ muscles)- Dry Needling, Patient/Family education, Balance training, Stair training, Taping, Joint mobilization, Joint manipulation, Spinal manipulation, Spinal mobilization, Scar mobilization, Compression bandaging, Vestibular training, DME instructions, Cryotherapy, and Moist heat  PLAN FOR NEXT SESSION:    hip flexor stretching, general hip/LE strengthening, balance (static and functional)   Susannah Daring, PT, DPT 09/10/24 11:41 AM

## 2024-09-10 ENCOUNTER — Encounter

## 2024-09-10 ENCOUNTER — Ambulatory Visit

## 2024-09-10 DIAGNOSIS — M25551 Pain in right hip: Secondary | ICD-10-CM | POA: Diagnosis not present

## 2024-09-10 DIAGNOSIS — M6281 Muscle weakness (generalized): Secondary | ICD-10-CM

## 2024-09-10 DIAGNOSIS — M5459 Other low back pain: Secondary | ICD-10-CM | POA: Diagnosis not present

## 2024-09-10 DIAGNOSIS — R2689 Other abnormalities of gait and mobility: Secondary | ICD-10-CM | POA: Diagnosis not present

## 2024-09-10 DIAGNOSIS — R293 Abnormal posture: Secondary | ICD-10-CM | POA: Diagnosis not present

## 2024-09-10 DIAGNOSIS — R2681 Unsteadiness on feet: Secondary | ICD-10-CM | POA: Diagnosis not present

## 2024-09-11 ENCOUNTER — Encounter

## 2024-09-12 NOTE — Therapy (Signed)
 OUTPATIENT PHYSICAL THERAPY TREATMENT   Patient Name: Lauren [name] MRN: [id number] DOB:07/08/1946, 78 y.o., female Today's Date: 09/13/2024  END OF SESSION:  PT End of Session - 09/13/24 0934     Visit Number 5    Number of Visits 16    Date for Recertification  10/16/24    Authorization Type [employer] $15 COPAY    Progress Note Due on Visit 10    PT Start Time 0934    PT Stop Time 1012    PT Time Calculation (min) 38 min    Activity Tolerance Patient tolerated treatment well;Patient limited by pain    Behavior During Therapy [facility] for tasks assessed/performed           Past Medical History:  Diagnosis Date   Anxiety    Arthritis    Back, Hip- right   Atypical chest pain 06/25/2021   Back pain    Cancer (HCC)    right breast   Cataract    Colitis    Coronary artery calcification 02/20/2020   Diabetes mellitus without complication (HCC)    Type II   Edema 03/05/2021   Edema of both lower extremities    Endometriosis    Family history of adverse reaction to anesthesia    Father - N/V - blood pressure; daughter N/V   Family history of breast cancer    Family history of colon cancer    Family history of kidney cancer    Family history of prostate cancer    Fatty liver    GERD (gastroesophageal reflux disease)    History of hiatal hernia    History of kidney stones    History of ulcerative colitis    Hypertension    Insomnia    Joint pain    Kidney problem    Lower extremity edema 03/05/2021   Lumbar disc disease    L4 L5   Neuropathy    Osteoarthritis    Other hyperlipidemia    Palpitations    Personal history of radiation therapy    Pneumonia    1983, 2003   Pure hypercholesterolemia 02/20/2020   PVCs (premature ventricular contractions)    benign   Umbilical hernia    Vitamin D  deficiency    Wears glasses    Past Surgical History:  Procedure Laterality Date   APPENDECTOMY     BREAST BIOPSY  08-13-2009  DR TSUEI   EXCISION LEFT NIPPLE  DUCT   BREAST EXCISIONAL BIOPSY Left    x2   BREAST EXCISIONAL BIOPSY Right    BREAST LUMPECTOMY Right 11/16/2018   invasive ductal   CHOLECYSTECTOMY  1992   COLON RESECTION  1986   ULCERATIVE COLITIS   COLON SURGERY     ESOPHAGEAL MANOMETRY N/A 06/11/2013   Procedure: ESOPHAGEAL MANOMETRY (EM);  Surgeon: Oliva FORBES Boots, MD;  Location: [facility];  Service: Endoscopy;  Laterality: N/A;   ESOPHAGOGASTRODUODENOSCOPY (EGD) WITH PROPOFOL  N/A 05/31/2016   Procedure: ESOPHAGOGASTRODUODENOSCOPY (EGD) WITH PROPOFOL ;  Surgeon: Oliva Boots, MD;  Location: [facility] ENDOSCOPY;  Service: Endoscopy;  Laterality: N/A;   EXCISION RIGHT BREAST MASS   10-20-2005  DR MAUDE YOUNG   EYE SURGERY Bilateral 2023   cataract   FRACTURE SURGERY     left arm   kidney stone removed  12/2009   KNEE ARTHROSCOPY Left    MCL   LEFT THUMB CARPOMETACARPAL JOINT SUSPENSIONPLASTY  04-06-2006  DR SISSY   LEFT WRIST ARTHROSCOPY W/ DEBRIDEMENT AND REMOVAL CYST  03-26-2004  DR  WEINGOLD   MYOMECTOMY     RADIOACTIVE SEED GUIDED PARTIAL MASTECTOMY WITH AXILLARY SENTINEL LYMPH NODE BIOPSY Right 11/16/2018   Procedure: RIGHT BREAST RADIOACTIVE SEED GUIDED PARTIAL MASTECTOMY WITH  SENTINEL  NODE BIOPSY;  Surgeon: Vernetta Berg, MD;  Location: [facility] OR;  Service: General;  Laterality: Right;   RIGHT COLECTOMY     RIGHT URETEROSCOPIC STONE EXTRACTION  12-11-2009  DR NORLEEN [facility]   TENDON RELEASE     TONSILLECTOMY     TOTAL HIP ARTHROPLASTY Right 11/26/2016   Procedure: RIGHT TOTAL HIP ARTHROPLASTY ANTERIOR APPROACH;  Surgeon: Lonni CINDERELLA Vernetta, MD;  Location: [facility] ORS;  Service: Orthopedics;  Laterality: Right;   TOTAL HIP ARTHROPLASTY Left 12/06/2023   Procedure: LEFT TOTAL HIP ARTHROPLASTY ANTERIOR APPROACH;  Surgeon: Vernetta Lonni CINDERELLA, MD;  Location: [facility] OR;  Service: Orthopedics;  Laterality: Left;   TOTAL KNEE ARTHROPLASTY Left 12/02/2017   Procedure: LEFT TOTAL KNEE ARTHROPLASTY;  Surgeon: Vernetta Lonni CINDERELLA, MD;   Location: [facility];  Service: Orthopedics;  Laterality: Left;   TOTAL KNEE ARTHROPLASTY Right 08/03/2022   Procedure: RIGHT TOTAL KNEE ARTHROPLASTY;  Surgeon: Vernetta Lonni CINDERELLA, MD;  Location: [facility] OR;  Service: Orthopedics;  Laterality: Right;   WRIST SURGERY     both   Patient Active Problem List   Diagnosis Date Noted   Vaginal atrophy 06/29/2024   History of iron deficiency 05/24/2024   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   Falls 05/01/2024   Iron deficiency 05/01/2024   Overactive bladder 02/22/2024   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/2023   SUI (stress urinary incontinence, female) 11/22/2023   RLS (restless legs syndrome) 09/22/2023   Other hyperlipidemia 06/08/2023   Unilateral primary osteoarthritis, left hip 05/11/2023   Depression 03/16/2023   Allergic rhinitis 12/29/2022   Seasonal affective disorder 12/01/2022   BMI 32.0-32.9,adult 12/01/2022   Obesity, Beginning BMI 37.59 12/01/2022   OA (osteoarthritis) of knee 08/03/2022   Status post total right knee replacement 08/03/2022   Stress 07/19/2022   Other constipation 07/19/2022   Atypical chest pain 06/25/2021   Class 2 severe obesity with serious comorbidity and body mass index (BMI) of 37.0 to 37.9 in adult 06/03/2021   Vitamin D  deficiency 06/03/2021   Other fatigue 04/22/2021   SOB (shortness of breath) on exertion 04/22/2021   Lower extremity edema 03/05/2021   Coronary artery calcification 02/20/2020   Pure hypercholesterolemia 02/20/2020   Unilateral primary osteoarthritis, right knee 01/03/2019   Genetic testing 12/07/2018   Family history of breast cancer    Family history of prostate cancer    Family history of colon cancer    Family history of kidney cancer    Malignant neoplasm of upper-outer quadrant of right breast in female, 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/09/2018   Diabetic neuropathy, type II diabetes mellitus (HCC) 11/09/2018   Status post total left knee replacement  12/02/2017   Trochanteric bursitis, right hip 05/10/2017   Status post total replacement of right hip 11/26/2016   DDD (degenerative disc disease), 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   Low back pain 04/23/2015   Hiatal hernia 04/13/2013   Diabetes mellitus (HCC) 01/26/2013   Umbilical hernia 07/24/2012   Essential hypertension     PCP: Carlin Dale Gull, MD   REFERRING PROVIDER: Lonell Sprang, DO  REFERRING DIAG: M76.11 (ICD-10-CM) - Psoas tendinitis of right side M25.551,G89.29,Z96.641 (ICD-10-CM) - Chronic hip pain after total replacement of right hip joint R26.89 (ICD-10-CM) - Abnormality of gait due to impairment of balance M25.551,M25.552 (ICD-10-CM) - Bilateral hip  pain  THERAPY DIAG:  Pain in right hip  Other low back pain  Muscle weakness (generalized)  Abnormal posture  Unsteadiness on feet  Other abnormalities of gait and mobility  Rationale for Evaluation and Treatment: Rehabilitation  ONSET DATE: following HHPT for TKA 3 years ago   SUBJECTIVE:   SUBJECTIVE STATEMENT: Patient reports no change in symptoms   PERTINENT HISTORY: Patient has been experiencing Rt hip pain (mainly iliopsoas) and balance issues. Patient has received injections from Dr. Burnetta for iliopsoas pain that assisted with pain. Patient has undergone bilat TKA and bilat THA with no other injuries or surgeries to surrounding areas. Patient intermittently experiencing low back pain. Patient has experienced 5 falls in past year with 2 major falls leading to injuries; has since been cleared by medical team.  May need to check BP prior to sessions   See PMH or personal factors for in depth comorbidities    PAIN:  NPRS scale: 4/10 soreness/pain  Pain location: Rt hip  Pain description: sharp when moving, dull when sitting  Aggravating factors: stairs, into car, bed mobility/transfers  Relieving factors: heating pad, rest    PRECAUTIONS: Fall  RED FLAGS: Bowel or bladder incontinence: No and Cauda  equina syndrome: No   WEIGHT BEARING RESTRICTIONS: No  FALLS:  Has patient fallen in last 6 months? Yes. Number of falls 5; 2 caused injuries (one from a potential medication issue that has since been changed, had on clogs and landed flat with arms by side hitting face, bruised ribs, etc.)  LIVING ENVIRONMENT: Lives with: lives alone Lives in: House/apartment Stairs: Yes: Internal: 1 steps; on right going up Has following equipment at home: Single point cane, Walker - 2 wheeled, and Grab bars  OCCUPATION: retired   PLOF: Independent  PATIENT GOALS: be more independent, reduce pain to be more active, get down on the floor and back   NEXT MD VISIT: not scheduled with Dr. Burnetta, follow up with Dr. Vernetta Sep 05, 2024  OBJECTIVE:  Note: Objective measures were completed at Evaluation unless otherwise noted.  DIAGNOSTIC FINDINGS:  An AP pelvis shows bilateral total hip arthroplasties with no complicating  features.   PATIENT SURVEYS:  PSFS: THE PATIENT SPECIFIC FUNCTIONAL SCALE  Place score of 0-10 (0 = unable to perform activity and 10 = able to perform activity at the same level as before injury or problem)  Activity Date: 08/21/2024    Get in and out of car   1    2. Get in and out of bed   1    3. Go up and down steps   3    4.      Total Score 1.66      Total Score = Sum of activity scores/number of activities  Minimally Detectable Change: 3 points (for single activity); 2 points (for average score)  [facility] (nd). The Patient Specific Functional Scale . Retrieved from [email or web address]   COGNITION: 08/21/2024 Overall cognitive status: Within functional limits for tasks assessed     SENSATION: 08/21/2024 Not tested  EDEMA:  08/21/2024 Not tested   MUSCLE LENGTH: 08/21/2024 Not formally assessed secondary to pain   POSTURE:  08/21/2024 rounded shoulders, forward head, and increased  thoracic kyphosis  PALPATION: 08/21/2024 TTP at Rt iliopsoas, Rt lateral glutes, Rt posterior glutes   LOWER EXTREMITY ROM:  ROM Right Eval 08/21/2024 Left Eval 08/21/2024  Hip flexion AROM: 63deg PROM: ~95deg AROM: 110deg with back pain noted   Hip extension  Hip abduction    Hip adduction    Hip internal rotation    Hip external rotation    Knee flexion    Knee extension    Ankle dorsiflexion    Ankle plantarflexion    Ankle inversion    Ankle eversion     (Blank rows = not tested)  LOWER EXTREMITY MMT:  MMT Right Eval 08/21/2024 Left Eval 08/21/2024  Hip flexion (seated) 2+/5 3+/5  Hip extension (prone) 2+/5 3-/5  Hip abduction (sidelying) 2+/5 3+/5  Hip adduction    Hip internal rotation    Hip external rotation    Knee flexion(seated) 4+/5 5/5  Knee extension(seated) 3-/5 5/5  Ankle dorsiflexion(seated) 5/5 5/5  Ankle plantarflexion    Ankle inversion    Ankle eversion     (Blank rows = not tested)  LOWER EXTREMITY SPECIAL TESTS:  08/21/2024 No testing.    FUNCTIONAL TESTS:  08/21/2024 5 times sit to stand: 18.53s  GAIT: 08/21/2024 Distance walked: not formally assessed  Assistive device utilized: None Level of assistance: supervision  Comments: antalgic gait pattern    [facility] PT Assessment - 09/05/24 0001       Functional Gait  Assessment   Gait Level Surface Walks 20 ft, slow speed, abnormal gait pattern, evidence for imbalance or deviates 10-15 in outside of the 12 in walkway width. Requires more than 7 sec to ambulate 20 ft.    Change in Gait Speed Able to change speed, demonstrates mild gait deviations, deviates 6-10 in outside of the 12 in walkway width, or no gait deviations, unable to achieve a major change in velocity, or uses a change in velocity, or uses an assistive device.    Gait with Horizontal Head Turns Performs head turns with moderate changes in gait velocity, slows down, deviates 10-15 in outside 12 in walkway width but  recovers, can continue to walk.    Gait with Vertical Head Turns Performs task with slight change in gait velocity (eg, minor disruption to smooth gait path), deviates 6 - 10 in outside 12 in walkway width or uses assistive device    Gait and Pivot Turn Pivot turns safely in greater than 3 sec and stops with no loss of balance, or pivot turns safely within 3 sec and stops with mild imbalance, requires small steps to catch balance.    Step Over Obstacle Is able to step over one shoe box (4.5 in total height) but must slow down and adjust steps to clear box safely. May require verbal cueing.    Gait with Narrow Base of Support Ambulates less than 4 steps heel to toe or cannot perform without assistance.    Gait with Eyes Closed Cannot walk 20 ft without assistance, severe gait deviations or imbalance, deviates greater than 15 in outside 12 in walkway width or will not attempt task.    Ambulating Backwards Walks 20 ft, slow speed, abnormal gait pattern, evidence for imbalance, deviates 10-15 in outside 12 in walkway width.    Steps Alternating feet, must use rail.    Total Score 12    FGA comment: Max score 30  Interpretation: 25-28 = low risk fall   19-24 = medium risk fall  < 19 = high risk fall                             TREATMENT        DATE: 09/13/2024 TherEx:  Nustep with bilat LE  only level 4 for 8 minutes  Thomas/hip flexor stretch on mat table 5x45s with PT applying STM to proximal quad/hip flexor on Rt side  Thomas/hip flexor stretch on mat table 2x45s on Lt  Supine marches 2x16 ; attempted another set with PPT which increased pain levels so discontinued  Supine bridge with yellow ball between knees 2x8 with 2s hold  Supine clamshells 2x10 with 3s holds    TREATMENT        DATE: 09/10/2024 TherEx:  UBE with bilat LE only level 2 for 8 minutes  Thomas/hip flexor stretch on mat table 3x30s with PT applying slight STM to proximal quad/hip flexor  Attempted prone on elbows, though  no stretch in hip flexor noted; held for 30s  Attempted sidelying Rt hip flexion with patient noting difficulty/slight pain, though increased tightness noted in Rt hip extensors  Supine single knee to chest 3x30s ; patient noting highly uncomfortable but not painful to do eccentric motion of return foot to table  Extension of Lt knee during last trial leading to increased pain in ant Rt hip then patient endorsing that she is starting to experience very mild pain in Lt ant hip similar to what she is experiencing in Rt ant hip   TherAct:  Step over and back with small hurdle using bilat UE support 2x12 with Lt LE only  Step taps to 2 step with Rt LE only 2x12 with unilat UE support and intermittently attempting no UE support    TREATMENT        DATE: 09/05/2024 TherEx:  UBE with bilat LE only level 2 for 8 minutes  Tap down squats with 5# DB 2x10 with a decrease in table height after first round to increase difficulty   Physical Performance  FGA performed with intermittent CGA  Results discussed with patient and noted above    TREATMENT        DATE: 09/03/2024 Therex: UBE with bilat LE only level 2 for 8 minutes  Bilat leg press 3x10 with 75#   Neuro Re-ed: performed within the parallel bars with CGA throughout  Tandem stance 2x30s with each leg back; intermittent UE use throughout secondary to lateral LOB  Narrow stance with horizontal head turns 1x16 turns  Narrow stance with vertical head turns 1x16 turns  Typical stance on airex 1x30s  Narrow stance on airex 1x30s  PT educated and discussed 3 balance systems, why tandem stance is more difficult and causes soreness with Rt LE back, how other comorbidities can affect balance, and how the balance system needs to be challenged just like muscles in order to strengthen     PATIENT EDUCATION:  Education details: HEP, POC, interventions, strength and flexibility  Person educated: Patient Education method: Explanation, Demonstration,  Tactile cues, Verbal cues, and Handouts Education comprehension: verbalized understanding, returned demonstration, verbal cues required, and tactile cues required  HOME EXERCISE PROGRAM: Access Code: [email or web address] URL: [email or web address] Date: 08/21/2024 Prepared by: Susannah Daring  Exercises - Standing Hip Abduction with Counter Support  - 1 x daily - 7 x weekly - 2 sets - 5-10 reps - Standing Hip Extension with Counter Support  - 1 x daily - 7 x weekly - 2 sets - 5-10 reps - Mini Squat with Counter Support  - 1 x daily - 7 x weekly - 2 sets - 5-10 reps - Modified Thomas Stretch  - 1 x daily - 7 x weekly - 3 sets - 15-30s hold - Standing Hip Flexor Stretch  -  1 x daily - 7 x weekly - 3 sets - 15-30s hold  ASSESSMENT:  CLINICAL IMPRESSION: Patient arrived to session endorsing no change in symptoms since last session. Patient tolerated all activities this date and continues to have high levels of pain, especially when initially firing Rt hip flexors.  Patient will continue to benefit from skilled PT.  OBJECTIVE IMPAIRMENTS: Abnormal gait, decreased activity tolerance, decreased balance, decreased coordination, decreased endurance, decreased mobility, difficulty walking, decreased ROM, decreased strength, impaired flexibility, improper body mechanics, postural dysfunction, obesity, and pain.   ACTIVITY LIMITATIONS: bending, sitting, standing, squatting, sleeping, stairs, transfers, and bed mobility  PARTICIPATION LIMITATIONS: interpersonal relationship, community activity, and church  PERSONAL FACTORS: Past/current experiences, Time since onset of injury/illness/exacerbation, and 3+ comorbidities: anxiety, arthritis, cancer history, DM, GERD, HLD, HTN, kidney disease, ulcerative colitis, restless leg syndrome, lumbar disc disease  are also affecting patient's functional outcome.   REHAB POTENTIAL: Good  CLINICAL DECISION MAKING: Evolving/moderate complexity  EVALUATION  COMPLEXITY: Moderate   GOALS: Goals reviewed with patient? Yes  SHORT TERM GOALS: Target date: 09/11/2024 Patient will show compliance with initial HEP.  Baseline: Goal status: GOAL MET, 09/13/2024  2.  Patient will report pain levels no greater than 7/10 to show overall improved quality of life. Baseline:  Goal status: goal ongoing, 09/13/2024   LONG TERM GOALS: Target date: 10/16/2024  Patient will be independent with final HEP in order to maintain and progress upon functional gains made within PT. Baseline:  Goal status: INITIAL  2.  Patient will report pain levels no greater than 4/10 to show overall improved quality of life. Baseline:  Goal status: INITIAL  3.  Patient will increase PSFS to at least 3.66 in order to show a significant improvement in subjective disability rating. Baseline:  Goal status: INITIAL  4.  Patient will increase bilat hip abduction strength to at least 4-/5 in order to improve biomechanics with functional mobility. Baseline:  Goal status: INITIAL  5.  Patient will increase bilat hip extension strength to at least 3+/5 in order to improve biomechanics with functional mobility. Baseline:  Goal status: INITIAL  6.  Patient will increase Rt hip flexion AROM to at least 90deg in order to improve functional mobility. Baseline:  Goal status: INITIAL  7.  Patient will increase 5xSTS to at least 16.23s in order to decrease chances of falling. Baseline:  Goal status: INITIAL   PLAN:  PT FREQUENCY: 1-2x/week  PT DURATION: 8 weeks  PLANNED INTERVENTIONS: [id number]- PT Re-evaluation, 97750- Physical Performance Testing, 97110-Therapeutic exercises, 97530- Therapeutic activity, [id number]- Neuromuscular re-education, 97535- Self Care, [id number]- Manual therapy, [id number]- Gait training, [phone]- Orthotic Initial, [phone]- Orthotic/Prosthetic subsequent, [phone]- Canalith repositioning, [phone]- Aquatic Therapy, [phone]- Electrical stimulation (unattended), [phone]- Electrical  stimulation (manual), [id number]- Vasopneumatic device, [id number]- Ultrasound, [id number]- Traction (mechanical), [id number]- Ionotophoresis 4mg /ml Dexamethasone , [id number] (1-2 muscles), [id number] (3+ muscles)- Dry Needling, Patient/Family education, Balance training, Stair training, Taping, Joint mobilization, Joint manipulation, Spinal manipulation, Spinal mobilization, Scar mobilization, Compression bandaging, Vestibular training, DME instructions, Cryotherapy, and Moist heat  PLAN FOR NEXT SESSION:    hip flexor stretching, general hip/LE strengthening, balance (static and functional), eccentric hip flexion?   Susannah Daring, PT, DPT 09/13/24 11:18 AM

## 2024-09-13 ENCOUNTER — Ambulatory Visit

## 2024-09-13 DIAGNOSIS — M5459 Other low back pain: Secondary | ICD-10-CM | POA: Diagnosis not present

## 2024-09-13 DIAGNOSIS — R2689 Other abnormalities of gait and mobility: Secondary | ICD-10-CM

## 2024-09-13 DIAGNOSIS — M25551 Pain in right hip: Secondary | ICD-10-CM

## 2024-09-13 DIAGNOSIS — R2681 Unsteadiness on feet: Secondary | ICD-10-CM | POA: Diagnosis not present

## 2024-09-13 DIAGNOSIS — M6281 Muscle weakness (generalized): Secondary | ICD-10-CM | POA: Diagnosis not present

## 2024-09-13 DIAGNOSIS — R293 Abnormal posture: Secondary | ICD-10-CM

## 2024-09-14 NOTE — Therapy (Signed)
 OUTPATIENT PHYSICAL THERAPY TREATMENT   Patient Name: Lauren [name] MRN: [id number] DOB:1945-11-05, 78 y.o., female Today's Date: 09/17/2024  END OF SESSION:  PT End of Session - 09/17/24 0932     Visit Number 6    Number of Visits 16    Date for Recertification  10/16/24    Authorization Type [employer] $15 COPAY    Progress Note Due on Visit 10    PT Start Time 0932    PT Stop Time 1013    PT Time Calculation (min) 41 min    Activity Tolerance Patient tolerated treatment well;Patient limited by pain    Behavior During Therapy [facility] for tasks assessed/performed            Past Medical History:  Diagnosis Date   Anxiety    Arthritis    Back, Hip- right   Atypical chest pain 06/25/2021   Back pain    Cancer (HCC)    right breast   Cataract    Colitis    Coronary artery calcification 02/20/2020   Diabetes mellitus without complication (HCC)    Type II   Edema 03/05/2021   Edema of both lower extremities    Endometriosis    Family history of adverse reaction to anesthesia    Father - N/V - blood pressure; daughter N/V   Family history of breast cancer    Family history of colon cancer    Family history of kidney cancer    Family history of prostate cancer    Fatty liver    GERD (gastroesophageal reflux disease)    History of hiatal hernia    History of kidney stones    History of ulcerative colitis    Hypertension    Insomnia    Joint pain    Kidney problem    Lower extremity edema 03/05/2021   Lumbar disc disease    L4 L5   Neuropathy    Osteoarthritis    Other hyperlipidemia    Palpitations    Personal history of radiation therapy    Pneumonia    1983, 2003   Pure hypercholesterolemia 02/20/2020   PVCs (premature ventricular contractions)    benign   Umbilical hernia    Vitamin D  deficiency    Wears glasses    Past Surgical History:  Procedure Laterality Date   APPENDECTOMY     BREAST BIOPSY  08-13-2009  DR TSUEI   EXCISION LEFT  NIPPLE DUCT   BREAST EXCISIONAL BIOPSY Left    x2   BREAST EXCISIONAL BIOPSY Right    BREAST LUMPECTOMY Right 11/16/2018   invasive ductal   CHOLECYSTECTOMY  1992   COLON RESECTION  1986   ULCERATIVE COLITIS   COLON SURGERY     ESOPHAGEAL MANOMETRY N/A 06/11/2013   Procedure: ESOPHAGEAL MANOMETRY (EM);  Surgeon: Oliva FORBES Boots, MD;  Location: [facility];  Service: Endoscopy;  Laterality: N/A;   ESOPHAGOGASTRODUODENOSCOPY (EGD) WITH PROPOFOL  N/A 05/31/2016   Procedure: ESOPHAGOGASTRODUODENOSCOPY (EGD) WITH PROPOFOL ;  Surgeon: Oliva Boots, MD;  Location: [facility] ENDOSCOPY;  Service: Endoscopy;  Laterality: N/A;   EXCISION RIGHT BREAST MASS   10-20-2005  DR MAUDE YOUNG   EYE SURGERY Bilateral 2023   cataract   FRACTURE SURGERY     left arm   kidney stone removed  12/2009   KNEE ARTHROSCOPY Left    MCL   LEFT THUMB CARPOMETACARPAL JOINT SUSPENSIONPLASTY  04-06-2006  DR SISSY   LEFT WRIST ARTHROSCOPY W/ DEBRIDEMENT AND REMOVAL CYST  03-26-2004  DR SISSY   MYOMECTOMY     RADIOACTIVE SEED GUIDED PARTIAL MASTECTOMY WITH AXILLARY SENTINEL LYMPH NODE BIOPSY Right 11/16/2018   Procedure: RIGHT BREAST RADIOACTIVE SEED GUIDED PARTIAL MASTECTOMY WITH  SENTINEL  NODE BIOPSY;  Surgeon: Vernetta Berg, MD;  Location: [facility] OR;  Service: General;  Laterality: Right;   RIGHT COLECTOMY     RIGHT URETEROSCOPIC STONE EXTRACTION  12-11-2009  DR [facility]LE[facility]   TENDON RELEASE     TONSILLECTOMY     TOTAL HIP ARTHROPLASTY Right 11/26/2016   Procedure: RIGHT TOTAL HIP ARTHROPLASTY ANTERIOR APPROACH;  Surgeon: Lonni CINDERELLA Vernetta, MD;  Location: [facility] ORS;  Service: Orthopedics;  Laterality: Right;   TOTAL HIP ARTHROPLASTY Left 12/06/2023   Procedure: LEFT TOTAL HIP ARTHROPLASTY ANTERIOR APPROACH;  Surgeon: Vernetta Lonni CINDERELLA, MD;  Location: [facility] OR;  Service: Orthopedics;  Laterality: Left;   TOTAL KNEE ARTHROPLASTY Left 12/02/2017   Procedure: LEFT TOTAL KNEE ARTHROPLASTY;  Surgeon: Vernetta Lonni CINDERELLA,  MD;  Location: [facility];  Service: Orthopedics;  Laterality: Left;   TOTAL KNEE ARTHROPLASTY Right 08/03/2022   Procedure: RIGHT TOTAL KNEE ARTHROPLASTY;  Surgeon: Vernetta Lonni CINDERELLA, MD;  Location: [facility] OR;  Service: Orthopedics;  Laterality: Right;   WRIST SURGERY     both   Patient Active Problem List   Diagnosis Date Noted   Vaginal atrophy 06/29/2024   History of iron deficiency 05/24/2024   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   Falls 05/01/2024   Iron deficiency 05/01/2024   Overactive bladder 02/22/2024   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/2023   SUI (stress urinary incontinence, female) 11/22/2023   RLS (restless legs syndrome) 09/22/2023   Other hyperlipidemia 06/08/2023   Unilateral primary osteoarthritis, left hip 05/11/2023   Depression 03/16/2023   Allergic rhinitis 12/29/2022   Seasonal affective disorder 12/01/2022   BMI 32.0-32.9,adult 12/01/2022   Obesity, Beginning BMI 37.59 12/01/2022   OA (osteoarthritis) of knee 08/03/2022   Status post total right knee replacement 08/03/2022   Stress 07/19/2022   Other constipation 07/19/2022   Atypical chest pain 06/25/2021   Class 2 severe obesity with serious comorbidity and body mass index (BMI) of 37.0 to 37.9 in adult 06/03/2021   Vitamin D  deficiency 06/03/2021   Other fatigue 04/22/2021   SOB (shortness of breath) on exertion 04/22/2021   Lower extremity edema 03/05/2021   Coronary artery calcification 02/20/2020   Pure hypercholesterolemia 02/20/2020   Unilateral primary osteoarthritis, right knee 01/03/2019   Genetic testing 12/07/2018   Family history of breast cancer    Family history of prostate cancer    Family history of colon cancer    Family history of kidney cancer    Malignant neoplasm of upper-outer quadrant of right breast in female, 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/09/2018   Diabetic neuropathy, type II diabetes mellitus (HCC) 11/09/2018   Status post total left knee replacement  12/02/2017   Trochanteric bursitis, right hip 05/10/2017   Status post total replacement of right hip 11/26/2016   DDD (degenerative disc disease), 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   Low back pain 04/23/2015   Hiatal hernia 04/13/2013   Diabetes mellitus (HCC) 01/26/2013   Umbilical hernia 07/24/2012   Essential hypertension     PCP: Carlin Dale Gull, MD   REFERRING PROVIDER: Lonell Sprang, DO  REFERRING DIAG: M76.11 (ICD-10-CM) - Psoas tendinitis of right side M25.551,G89.29,Z96.641 (ICD-10-CM) - Chronic hip pain after total replacement of right hip joint R26.89 (ICD-10-CM) - Abnormality of gait due to impairment of balance M25.551,M25.552 (ICD-10-CM) - Bilateral  hip pain  THERAPY DIAG:  Pain in right hip  Other low back pain  Muscle weakness (generalized)  Abnormal posture  Unsteadiness on feet  Other abnormalities of gait and mobility  Rationale for Evaluation and Treatment: Rehabilitation  ONSET DATE: following HHPT for TKA 3 years ago   SUBJECTIVE:   SUBJECTIVE STATEMENT: Patient reporting soreness on Lt hip following last session, but overall improvement.   PERTINENT HISTORY: Patient has been experiencing Rt hip pain (mainly iliopsoas) and balance issues. Patient has received injections from Dr. Burnetta for iliopsoas pain that assisted with pain. Patient has undergone bilat TKA and bilat THA with no other injuries or surgeries to surrounding areas. Patient intermittently experiencing low back pain. Patient has experienced 5 falls in past year with 2 major falls leading to injuries; has since been cleared by medical team.  May need to check BP prior to sessions   See PMH or personal factors for in depth comorbidities    PAIN:  NPRS scale: 4/10 soreness/pain  Pain location: Rt hip  Pain description: sharp when moving, dull when sitting  Aggravating factors: stairs, into car, bed mobility/transfers  Relieving factors: heating pad, rest    PRECAUTIONS: Fall  RED  FLAGS: Bowel or bladder incontinence: No and Cauda equina syndrome: No   WEIGHT BEARING RESTRICTIONS: No  FALLS:  Has patient fallen in last 6 months? Yes. Number of falls 5; 2 caused injuries (one from a potential medication issue that has since been changed, had on clogs and landed flat with arms by side hitting face, bruised ribs, etc.)  LIVING ENVIRONMENT: Lives with: lives alone Lives in: House/apartment Stairs: Yes: Internal: 1 steps; on right going up Has following equipment at home: Single point cane, Walker - 2 wheeled, and Grab bars  OCCUPATION: retired   PLOF: Independent  PATIENT GOALS: be more independent, reduce pain to be more active, get down on the floor and back   NEXT MD VISIT: not scheduled with Dr. Burnetta, follow up with Dr. Vernetta Sep 05, 2024  OBJECTIVE:  Note: Objective measures were completed at Evaluation unless otherwise noted.  DIAGNOSTIC FINDINGS:  An AP pelvis shows bilateral total hip arthroplasties with no complicating  features.   PATIENT SURVEYS:  PSFS: THE PATIENT SPECIFIC FUNCTIONAL SCALE  Place score of 0-10 (0 = unable to perform activity and 10 = able to perform activity at the same level as before injury or problem)  Activity Date: 08/21/2024    Get in and out of car   1    2. Get in and out of bed   1    3. Go up and down steps   3    4.      Total Score 1.66      Total Score = Sum of activity scores/number of activities  Minimally Detectable Change: 3 points (for single activity); 2 points (for average score)  [facility] (nd). The Patient Specific Functional Scale . Retrieved from [email or web address]   COGNITION: 08/21/2024 Overall cognitive status: Within functional limits for tasks assessed     SENSATION: 08/21/2024 Not tested  EDEMA:  08/21/2024 Not tested   MUSCLE LENGTH: 08/21/2024 Not formally assessed secondary to pain   POSTURE:   08/21/2024 rounded shoulders, forward head, and increased thoracic kyphosis  PALPATION: 08/21/2024 TTP at Rt iliopsoas, Rt lateral glutes, Rt posterior glutes   LOWER EXTREMITY ROM:  ROM Right Eval 08/21/2024 Left Eval 08/21/2024  Hip flexion AROM: 63deg PROM: ~95deg AROM: 110deg with  back pain noted   Hip extension    Hip abduction    Hip adduction    Hip internal rotation    Hip external rotation    Knee flexion    Knee extension    Ankle dorsiflexion    Ankle plantarflexion    Ankle inversion    Ankle eversion     (Blank rows = not tested)  LOWER EXTREMITY MMT:  MMT Right Eval 08/21/2024 Left Eval 08/21/2024  Hip flexion (seated) 2+/5 3+/5  Hip extension (prone) 2+/5 3-/5  Hip abduction (sidelying) 2+/5 3+/5  Hip adduction    Hip internal rotation    Hip external rotation    Knee flexion(seated) 4+/5 5/5  Knee extension(seated) 3-/5 5/5  Ankle dorsiflexion(seated) 5/5 5/5  Ankle plantarflexion    Ankle inversion    Ankle eversion     (Blank rows = not tested)  LOWER EXTREMITY SPECIAL TESTS:  08/21/2024 No testing.    FUNCTIONAL TESTS:  08/21/2024 5 times sit to stand: 18.53s  GAIT: 08/21/2024 Distance walked: not formally assessed  Assistive device utilized: None Level of assistance: supervision  Comments: antalgic gait pattern    [facility] PT Assessment - 09/05/24 0001       Functional Gait  Assessment   Gait Level Surface Walks 20 ft, slow speed, abnormal gait pattern, evidence for imbalance or deviates 10-15 in outside of the 12 in walkway width. Requires more than 7 sec to ambulate 20 ft.    Change in Gait Speed Able to change speed, demonstrates mild gait deviations, deviates 6-10 in outside of the 12 in walkway width, or no gait deviations, unable to achieve a major change in velocity, or uses a change in velocity, or uses an assistive device.    Gait with Horizontal Head Turns Performs head turns with moderate changes in gait velocity,  slows down, deviates 10-15 in outside 12 in walkway width but recovers, can continue to walk.    Gait with Vertical Head Turns Performs task with slight change in gait velocity (eg, minor disruption to smooth gait path), deviates 6 - 10 in outside 12 in walkway width or uses assistive device    Gait and Pivot Turn Pivot turns safely in greater than 3 sec and stops with no loss of balance, or pivot turns safely within 3 sec and stops with mild imbalance, requires small steps to catch balance.    Step Over Obstacle Is able to step over one shoe box (4.5 in total height) but must slow down and adjust steps to clear box safely. May require verbal cueing.    Gait with Narrow Base of Support Ambulates less than 4 steps heel to toe or cannot perform without assistance.    Gait with Eyes Closed Cannot walk 20 ft without assistance, severe gait deviations or imbalance, deviates greater than 15 in outside 12 in walkway width or will not attempt task.    Ambulating Backwards Walks 20 ft, slow speed, abnormal gait pattern, evidence for imbalance, deviates 10-15 in outside 12 in walkway width.    Steps Alternating feet, must use rail.    Total Score 12    FGA comment: Max score 30  Interpretation: 25-28 = low risk fall   19-24 = medium risk fall  < 19 = high risk fall                             TREATMENT  DATE: 09/17/2024 TherEx:  Thomas/hip flexor stretch on mat table 5x45s with PT applying STM to proximal quad/hip flexor on Rt side  Thomas/hip flexor stretch on mat table 2x45s on Lt  UBE with bilat LE only level 2 for 8 minutes  PT discussed different stretches to perform for hip musculature (standing TFL/IT band stretch and standing lunge on step)  TherAct: Bilat leg press 3x8 with 75#  Lateral step ups with 4 step 2x8 each side with unilat UE support    TREATMENT        DATE: 09/13/2024 TherEx:  Nustep with bilat LE only level 4 for 8 minutes  Thomas/hip flexor stretch on mat table  5x45s with PT applying STM to proximal quad/hip flexor on Rt side  Thomas/hip flexor stretch on mat table 2x45s on Lt  Supine marches 2x16 ; attempted another set with PPT which increased pain levels so discontinued  Supine bridge with yellow ball between knees 2x8 with 2s hold  Supine clamshells 2x10 with 3s holds    TREATMENT        DATE: 09/10/2024 TherEx:  UBE with bilat LE only level 2 for 8 minutes  Thomas/hip flexor stretch on mat table 3x30s with PT applying slight STM to proximal quad/hip flexor  Attempted prone on elbows, though no stretch in hip flexor noted; held for 30s  Attempted sidelying Rt hip flexion with patient noting difficulty/slight pain, though increased tightness noted in Rt hip extensors  Supine single knee to chest 3x30s ; patient noting highly uncomfortable but not painful to do eccentric motion of return foot to table  Extension of Lt knee during last trial leading to increased pain in ant Rt hip then patient endorsing that she is starting to experience very mild pain in Lt ant hip similar to what she is experiencing in Rt ant hip   TherAct:  Step over and back with small hurdle using bilat UE support 2x12 with Lt LE only  Step taps to 2 step with Rt LE only 2x12 with unilat UE support and intermittently attempting no UE support    TREATMENT        DATE: 09/05/2024 TherEx:  UBE with bilat LE only level 2 for 8 minutes  Tap down squats with 5# DB 2x10 with a decrease in table height after first round to increase difficulty   Physical Performance  FGA performed with intermittent CGA  Results discussed with patient and noted above      PATIENT EDUCATION:  Education details: HEP, POC, interventions, strength and flexibility  Person educated: Patient Education method: Explanation, Demonstration, Tactile cues, Verbal cues, and Handouts Education comprehension: verbalized understanding, returned demonstration, verbal cues required, and tactile cues  required  HOME EXERCISE PROGRAM: Access Code: [email or web address] URL: [email or web address] Date: 08/21/2024 Prepared by: Susannah Daring  Exercises - Standing Hip Abduction with Counter Support  - 1 x daily - 7 x weekly - 2 sets - 5-10 reps - Standing Hip Extension with Counter Support  - 1 x daily - 7 x weekly - 2 sets - 5-10 reps - Mini Squat with Counter Support  - 1 x daily - 7 x weekly - 2 sets - 5-10 reps - Modified Thomas Stretch  - 1 x daily - 7 x weekly - 3 sets - 15-30s hold - Standing Hip Flexor Stretch  - 1 x daily - 7 x weekly - 3 sets - 15-30s hold  ASSESSMENT:  CLINICAL IMPRESSION: Patient arrived to session endorsing soreness in Lt hip following  last session, though it was more muscle soreness from activity than pain compared to Rt. Patient tolerated all activities this date and is endorsing improvement in Rt symptoms with activity. Patient will continue to benefit from skilled PT.  OBJECTIVE IMPAIRMENTS: Abnormal gait, decreased activity tolerance, decreased balance, decreased coordination, decreased endurance, decreased mobility, difficulty walking, decreased ROM, decreased strength, impaired flexibility, improper body mechanics, postural dysfunction, obesity, and pain.   ACTIVITY LIMITATIONS: bending, sitting, standing, squatting, sleeping, stairs, transfers, and bed mobility  PARTICIPATION LIMITATIONS: interpersonal relationship, community activity, and church  PERSONAL FACTORS: Past/current experiences, Time since onset of injury/illness/exacerbation, and 3+ comorbidities: anxiety, arthritis, cancer history, DM, GERD, HLD, HTN, kidney disease, ulcerative colitis, restless leg syndrome, lumbar disc disease  are also affecting patient's functional outcome.   REHAB POTENTIAL: Good  CLINICAL DECISION MAKING: Evolving/moderate complexity  EVALUATION COMPLEXITY: Moderate   GOALS: Goals reviewed with patient? Yes  SHORT TERM GOALS: Target date:  09/11/2024 Patient will show compliance with initial HEP.  Baseline: Goal status: GOAL MET, 09/13/2024  2.  Patient will report pain levels no greater than 7/10 to show overall improved quality of life. Baseline:  Goal status: goal ongoing, 09/13/2024   LONG TERM GOALS: Target date: 10/16/2024  Patient will be independent with final HEP in order to maintain and progress upon functional gains made within PT. Baseline:  Goal status: INITIAL  2.  Patient will report pain levels no greater than 4/10 to show overall improved quality of life. Baseline:  Goal status: INITIAL  3.  Patient will increase PSFS to at least 3.66 in order to show a significant improvement in subjective disability rating. Baseline:  Goal status: INITIAL  4.  Patient will increase bilat hip abduction strength to at least 4-/5 in order to improve biomechanics with functional mobility. Baseline:  Goal status: INITIAL  5.  Patient will increase bilat hip extension strength to at least 3+/5 in order to improve biomechanics with functional mobility. Baseline:  Goal status: INITIAL  6.  Patient will increase Rt hip flexion AROM to at least 90deg in order to improve functional mobility. Baseline:  Goal status: INITIAL  7.  Patient will increase 5xSTS to at least 16.23s in order to decrease chances of falling. Baseline:  Goal status: INITIAL   PLAN:  PT FREQUENCY: 1-2x/week  PT DURATION: 8 weeks  PLANNED INTERVENTIONS: [id number]- PT Re-evaluation, 97750- Physical Performance Testing, 97110-Therapeutic exercises, 97530- Therapeutic activity, [id number]- Neuromuscular re-education, 97535- Self Care, [id number]- Manual therapy, [id number]- Gait training, [phone]- Orthotic Initial, [phone]- Orthotic/Prosthetic subsequent, [phone]- Canalith repositioning, [phone]- Aquatic Therapy, [phone]- Electrical stimulation (unattended), [phone]- Electrical stimulation (manual), [id number]- Vasopneumatic device, [id number]- Ultrasound, [id number]- Traction (mechanical),  [id number]- Ionotophoresis 4mg /ml Dexamethasone , [id number] (1-2 muscles), [id number] (3+ muscles)- Dry Needling, Patient/Family education, Balance training, Stair training, Taping, Joint mobilization, Joint manipulation, Spinal manipulation, Spinal mobilization, Scar mobilization, Compression bandaging, Vestibular training, DME instructions, Cryotherapy, and Moist heat  PLAN FOR NEXT SESSION:    hip flexor stretching, general hip/LE strengthening, balance (static and functional), eccentric hip flexion?   Susannah Daring, PT, DPT 09/17/24 11:13 AM

## 2024-09-17 ENCOUNTER — Ambulatory Visit

## 2024-09-17 DIAGNOSIS — M5459 Other low back pain: Secondary | ICD-10-CM | POA: Diagnosis not present

## 2024-09-17 DIAGNOSIS — M25551 Pain in right hip: Secondary | ICD-10-CM | POA: Diagnosis not present

## 2024-09-17 DIAGNOSIS — R2689 Other abnormalities of gait and mobility: Secondary | ICD-10-CM

## 2024-09-17 DIAGNOSIS — R2681 Unsteadiness on feet: Secondary | ICD-10-CM

## 2024-09-17 DIAGNOSIS — R293 Abnormal posture: Secondary | ICD-10-CM

## 2024-09-17 DIAGNOSIS — M6281 Muscle weakness (generalized): Secondary | ICD-10-CM | POA: Diagnosis not present

## 2024-09-19 NOTE — Therapy (Signed)
 OUTPATIENT PHYSICAL THERAPY TREATMENT   Patient Name: Lauren [name] MRN: [id number] DOB:01/23/1946, 78 y.o., female Today's Date: 09/20/2024  END OF SESSION:  PT End of Session - 09/20/24 0934     Visit Number 7    Number of Visits 16    Date for Recertification  10/16/24    Authorization Type [employer] $15 COPAY    Progress Note Due on Visit 10    PT Start Time 0934    PT Stop Time 1013    PT Time Calculation (min) 39 min    Activity Tolerance Patient tolerated treatment well;Patient limited by pain    Behavior During Therapy [facility] for tasks assessed/performed           Past Medical History:  Diagnosis Date   Anxiety    Arthritis    Back, Hip- right   Atypical chest pain 06/25/2021   Back pain    Cancer (HCC)    right breast   Cataract    Colitis    Coronary artery calcification 02/20/2020   Diabetes mellitus without complication (HCC)    Type II   Edema 03/05/2021   Edema of both lower extremities    Endometriosis    Family history of adverse reaction to anesthesia    Father - N/V - blood pressure; daughter N/V   Family history of breast cancer    Family history of colon cancer    Family history of kidney cancer    Family history of prostate cancer    Fatty liver    GERD (gastroesophageal reflux disease)    History of hiatal hernia    History of kidney stones    History of ulcerative colitis    Hypertension    Insomnia    Joint pain    Kidney problem    Lower extremity edema 03/05/2021   Lumbar disc disease    L4 L5   Neuropathy    Osteoarthritis    Other hyperlipidemia    Palpitations    Personal history of radiation therapy    Pneumonia    1983, 2003   Pure hypercholesterolemia 02/20/2020   PVCs (premature ventricular contractions)    benign   Umbilical hernia    Vitamin D  deficiency    Wears glasses    Past Surgical History:  Procedure Laterality Date   APPENDECTOMY     BREAST BIOPSY  08-13-2009  DR TSUEI   EXCISION LEFT NIPPLE  DUCT   BREAST EXCISIONAL BIOPSY Left    x2   BREAST EXCISIONAL BIOPSY Right    BREAST LUMPECTOMY Right 11/16/2018   invasive ductal   CHOLECYSTECTOMY  1992   COLON RESECTION  1986   ULCERATIVE COLITIS   COLON SURGERY     ESOPHAGEAL MANOMETRY N/A 06/11/2013   Procedure: ESOPHAGEAL MANOMETRY (EM);  Surgeon: Oliva FORBES Boots, MD;  Location: [facility];  Service: Endoscopy;  Laterality: N/A;   ESOPHAGOGASTRODUODENOSCOPY (EGD) WITH PROPOFOL  N/A 05/31/2016   Procedure: ESOPHAGOGASTRODUODENOSCOPY (EGD) WITH PROPOFOL ;  Surgeon: Oliva Boots, MD;  Location: [facility] ENDOSCOPY;  Service: Endoscopy;  Laterality: N/A;   EXCISION RIGHT BREAST MASS   10-20-2005  DR MAUDE YOUNG   EYE SURGERY Bilateral 2023   cataract   FRACTURE SURGERY     left arm   kidney stone removed  12/2009   KNEE ARTHROSCOPY Left    MCL   LEFT THUMB CARPOMETACARPAL JOINT SUSPENSIONPLASTY  04-06-2006  DR SISSY   LEFT WRIST ARTHROSCOPY W/ DEBRIDEMENT AND REMOVAL CYST  03-26-2004  DR  WEINGOLD   MYOMECTOMY     RADIOACTIVE SEED GUIDED PARTIAL MASTECTOMY WITH AXILLARY SENTINEL LYMPH NODE BIOPSY Right 11/16/2018   Procedure: RIGHT BREAST RADIOACTIVE SEED GUIDED PARTIAL MASTECTOMY WITH  SENTINEL  NODE BIOPSY;  Surgeon: Vernetta Berg, MD;  Location: [facility] OR;  Service: General;  Laterality: Right;   RIGHT COLECTOMY     RIGHT URETEROSCOPIC STONE EXTRACTION  12-11-2009  DR NORLEEN [facility]   TENDON RELEASE     TONSILLECTOMY     TOTAL HIP ARTHROPLASTY Right 11/26/2016   Procedure: RIGHT TOTAL HIP ARTHROPLASTY ANTERIOR APPROACH;  Surgeon: Lonni CINDERELLA Vernetta, MD;  Location: [facility] ORS;  Service: Orthopedics;  Laterality: Right;   TOTAL HIP ARTHROPLASTY Left 12/06/2023   Procedure: LEFT TOTAL HIP ARTHROPLASTY ANTERIOR APPROACH;  Surgeon: Vernetta Lonni CINDERELLA, MD;  Location: [facility] OR;  Service: Orthopedics;  Laterality: Left;   TOTAL KNEE ARTHROPLASTY Left 12/02/2017   Procedure: LEFT TOTAL KNEE ARTHROPLASTY;  Surgeon: Vernetta Lonni CINDERELLA, MD;   Location: [facility];  Service: Orthopedics;  Laterality: Left;   TOTAL KNEE ARTHROPLASTY Right 08/03/2022   Procedure: RIGHT TOTAL KNEE ARTHROPLASTY;  Surgeon: Vernetta Lonni CINDERELLA, MD;  Location: [facility] OR;  Service: Orthopedics;  Laterality: Right;   WRIST SURGERY     both   Patient Active Problem List   Diagnosis Date Noted   Vaginal atrophy 06/29/2024   History of iron deficiency 05/24/2024   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   Falls 05/01/2024   Iron deficiency 05/01/2024   Overactive bladder 02/22/2024   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/2023   SUI (stress urinary incontinence, female) 11/22/2023   RLS (restless legs syndrome) 09/22/2023   Other hyperlipidemia 06/08/2023   Unilateral primary osteoarthritis, left hip 05/11/2023   Depression 03/16/2023   Allergic rhinitis 12/29/2022   Seasonal affective disorder 12/01/2022   BMI 32.0-32.9,adult 12/01/2022   Obesity, Beginning BMI 37.59 12/01/2022   OA (osteoarthritis) of knee 08/03/2022   Status post total right knee replacement 08/03/2022   Stress 07/19/2022   Other constipation 07/19/2022   Atypical chest pain 06/25/2021   Class 2 severe obesity with serious comorbidity and body mass index (BMI) of 37.0 to 37.9 in adult 06/03/2021   Vitamin D  deficiency 06/03/2021   Other fatigue 04/22/2021   SOB (shortness of breath) on exertion 04/22/2021   Lower extremity edema 03/05/2021   Coronary artery calcification 02/20/2020   Pure hypercholesterolemia 02/20/2020   Unilateral primary osteoarthritis, right knee 01/03/2019   Genetic testing 12/07/2018   Family history of breast cancer    Family history of prostate cancer    Family history of colon cancer    Family history of kidney cancer    Malignant neoplasm of upper-outer quadrant of right breast in female, 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/09/2018   Diabetic neuropathy, type II diabetes mellitus (HCC) 11/09/2018   Status post total left knee replacement  12/02/2017   Trochanteric bursitis, right hip 05/10/2017   Status post total replacement of right hip 11/26/2016   DDD (degenerative disc disease), 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   Low back pain 04/23/2015   Hiatal hernia 04/13/2013   Diabetes mellitus (HCC) 01/26/2013   Umbilical hernia 07/24/2012   Essential hypertension     PCP: Carlin Dale Gull, MD   REFERRING PROVIDER: Lonell Sprang, DO  REFERRING DIAG: M76.11 (ICD-10-CM) - Psoas tendinitis of right side M25.551,G89.29,Z96.641 (ICD-10-CM) - Chronic hip pain after total replacement of right hip joint R26.89 (ICD-10-CM) - Abnormality of gait due to impairment of balance M25.551,M25.552 (ICD-10-CM) - Bilateral hip  pain  THERAPY DIAG:  Pain in right hip  Other low back pain  Muscle weakness (generalized)  Abnormal posture  Unsteadiness on feet  Other abnormalities of gait and mobility  Rationale for Evaluation and Treatment: Rehabilitation  ONSET DATE: following HHPT for TKA 3 years ago   SUBJECTIVE:   SUBJECTIVE STATEMENT: Patient reporting soreness in Lt hip.   PERTINENT HISTORY: Patient has been experiencing Rt hip pain (mainly iliopsoas) and balance issues. Patient has received injections from Dr. Burnetta for iliopsoas pain that assisted with pain. Patient has undergone bilat TKA and bilat THA with no other injuries or surgeries to surrounding areas. Patient intermittently experiencing low back pain. Patient has experienced 5 falls in past year with 2 major falls leading to injuries; has since been cleared by medical team.  May need to check BP prior to sessions   See PMH or personal factors for in depth comorbidities    PAIN:  NPRS scale: 4/10 soreness/pain ; 4/10 Lt hip Pain location: Rt hip  Pain description: sharp when moving, dull when sitting  Aggravating factors: stairs, into car, bed mobility/transfers  Relieving factors: heating pad, rest    PRECAUTIONS: Fall  RED FLAGS: Bowel or bladder incontinence:  No and Cauda equina syndrome: No   WEIGHT BEARING RESTRICTIONS: No  FALLS:  Has patient fallen in last 6 months? Yes. Number of falls 5; 2 caused injuries (one from a potential medication issue that has since been changed, had on clogs and landed flat with arms by side hitting face, bruised ribs, etc.)  LIVING ENVIRONMENT: Lives with: lives alone Lives in: House/apartment Stairs: Yes: Internal: 1 steps; on right going up Has following equipment at home: Single point cane, Walker - 2 wheeled, and Grab bars  OCCUPATION: retired   PLOF: Independent  PATIENT GOALS: be more independent, reduce pain to be more active, get down on the floor and back   NEXT MD VISIT: not scheduled with Dr. Burnetta, follow up with Dr. Vernetta Sep 05, 2024  OBJECTIVE:  Note: Objective measures were completed at Evaluation unless otherwise noted.  DIAGNOSTIC FINDINGS:  An AP pelvis shows bilateral total hip arthroplasties with no complicating  features.   PATIENT SURVEYS:  PSFS: THE PATIENT SPECIFIC FUNCTIONAL SCALE  Place score of 0-10 (0 = unable to perform activity and 10 = able to perform activity at the same level as before injury or problem)  Activity Date: 08/21/2024    Get in and out of car   1    2. Get in and out of bed   1    3. Go up and down steps   3    4.      Total Score 1.66      Total Score = Sum of activity scores/number of activities  Minimally Detectable Change: 3 points (for single activity); 2 points (for average score)  [facility] (nd). The Patient Specific Functional Scale . Retrieved from [email or web address]   COGNITION: 08/21/2024 Overall cognitive status: Within functional limits for tasks assessed     SENSATION: 08/21/2024 Not tested  EDEMA:  08/21/2024 Not tested   MUSCLE LENGTH: 08/21/2024 Not formally assessed secondary to pain   POSTURE:  08/21/2024 rounded shoulders, forward head,  and increased thoracic kyphosis  PALPATION: 08/21/2024 TTP at Rt iliopsoas, Rt lateral glutes, Rt posterior glutes   LOWER EXTREMITY ROM:  ROM Right Eval 08/21/2024 Left Eval 08/21/2024  Hip flexion AROM: 63deg PROM: ~95deg AROM: 110deg with back pain noted  Hip extension    Hip abduction    Hip adduction    Hip internal rotation    Hip external rotation    Knee flexion    Knee extension    Ankle dorsiflexion    Ankle plantarflexion    Ankle inversion    Ankle eversion     (Blank rows = not tested)  LOWER EXTREMITY MMT:  MMT Right Eval 08/21/2024 Left Eval 08/21/2024  Hip flexion (seated) 2+/5 3+/5  Hip extension (prone) 2+/5 3-/5  Hip abduction (sidelying) 2+/5 3+/5  Hip adduction    Hip internal rotation    Hip external rotation    Knee flexion(seated) 4+/5 5/5  Knee extension(seated) 3-/5 5/5  Ankle dorsiflexion(seated) 5/5 5/5  Ankle plantarflexion    Ankle inversion    Ankle eversion     (Blank rows = not tested)  LOWER EXTREMITY SPECIAL TESTS:  08/21/2024 No testing.    FUNCTIONAL TESTS:  08/21/2024 5 times sit to stand: 18.53s  GAIT: 08/21/2024 Distance walked: not formally assessed  Assistive device utilized: None Level of assistance: supervision  Comments: antalgic gait pattern    [facility] PT Assessment - 09/05/24 0001       Functional Gait  Assessment   Gait Level Surface Walks 20 ft, slow speed, abnormal gait pattern, evidence for imbalance or deviates 10-15 in outside of the 12 in walkway width. Requires more than 7 sec to ambulate 20 ft.    Change in Gait Speed Able to change speed, demonstrates mild gait deviations, deviates 6-10 in outside of the 12 in walkway width, or no gait deviations, unable to achieve a major change in velocity, or uses a change in velocity, or uses an assistive device.    Gait with Horizontal Head Turns Performs head turns with moderate changes in gait velocity, slows down, deviates 10-15 in outside 12 in  walkway width but recovers, can continue to walk.    Gait with Vertical Head Turns Performs task with slight change in gait velocity (eg, minor disruption to smooth gait path), deviates 6 - 10 in outside 12 in walkway width or uses assistive device    Gait and Pivot Turn Pivot turns safely in greater than 3 sec and stops with no loss of balance, or pivot turns safely within 3 sec and stops with mild imbalance, requires small steps to catch balance.    Step Over Obstacle Is able to step over one shoe box (4.5 in total height) but must slow down and adjust steps to clear box safely. May require verbal cueing.    Gait with Narrow Base of Support Ambulates less than 4 steps heel to toe or cannot perform without assistance.    Gait with Eyes Closed Cannot walk 20 ft without assistance, severe gait deviations or imbalance, deviates greater than 15 in outside 12 in walkway width or will not attempt task.    Ambulating Backwards Walks 20 ft, slow speed, abnormal gait pattern, evidence for imbalance, deviates 10-15 in outside 12 in walkway width.    Steps Alternating feet, must use rail.    Total Score 12    FGA comment: Max score 30  Interpretation: 25-28 = low risk fall   19-24 = medium risk fall  < 19 = high risk fall                             TREATMENT        DATE: 09/20/2024 TherEx:  Recumbent bike level 3 for 8 minutes  Step taps to 4 step with bilat UE support for stability to work on initiating knee and hip flexion 2x20 taps  Increased challenge to 6 step 1x20 with increased pain endorsed after 6 reps   Neuro Re-Ed: Narrow stance with horizontal head turns 1x20 turns  Semi tandem stance with horizontal head turns 2x20 turns bilaterally ; intermittent increased lateral sway and only one instance of lateral LOB requiring UE use on // bars  Lateral walks on foam pad with bilat UE use 1x4 down and back  PT discussed how to progress with stability and balance, how strength plays into balance    TREATMENT        DATE: 09/17/2024 TherEx:  Thomas/hip flexor stretch on mat table 5x45s with PT applying STM to proximal quad/hip flexor on Rt side  Thomas/hip flexor stretch on mat table 2x45s on Lt  UBE with bilat LE only level 2 for 8 minutes  PT discussed different stretches to perform for hip musculature (standing TFL/IT band stretch and standing lunge on step)  TherAct: Bilat leg press 3x8 with 75#  Lateral step ups with 4 step 2x8 each side with unilat UE support    TREATMENT        DATE: 09/13/2024 TherEx:  Nustep with bilat LE only level 4 for 8 minutes  Thomas/hip flexor stretch on mat table 5x45s with PT applying STM to proximal quad/hip flexor on Rt side  Thomas/hip flexor stretch on mat table 2x45s on Lt  Supine marches 2x16 ; attempted another set with PPT which increased pain levels so discontinued  Supine bridge with yellow ball between knees 2x8 with 2s hold  Supine clamshells 2x10 with 3s holds    TREATMENT        DATE: 09/10/2024 TherEx:  UBE with bilat LE only level 2 for 8 minutes  Thomas/hip flexor stretch on mat table 3x30s with PT applying slight STM to proximal quad/hip flexor  Attempted prone on elbows, though no stretch in hip flexor noted; held for 30s  Attempted sidelying Rt hip flexion with patient noting difficulty/slight pain, though increased tightness noted in Rt hip extensors  Supine single knee to chest 3x30s ; patient noting highly uncomfortable but not painful to do eccentric motion of return foot to table  Extension of Lt knee during last trial leading to increased pain in ant Rt hip then patient endorsing that she is starting to experience very mild pain in Lt ant hip similar to what she is experiencing in Rt ant hip   TherAct:  Step over and back with small hurdle using bilat UE support 2x12 with Lt LE only  Step taps to 2 step with Rt LE only 2x12 with unilat UE support and intermittently attempting no UE support    TREATMENT         DATE: 09/05/2024 TherEx:  UBE with bilat LE only level 2 for 8 minutes  Tap down squats with 5# DB 2x10 with a decrease in table height after first round to increase difficulty   Physical Performance  FGA performed with intermittent CGA  Results discussed with patient and noted above      PATIENT EDUCATION:  Education details: HEP, POC, interventions, strength and flexibility  Person educated: Patient Education method: Explanation, Demonstration, Tactile cues, Verbal cues, and Handouts Education comprehension: verbalized understanding, returned demonstration, verbal cues required, and tactile cues required  HOME EXERCISE PROGRAM: Access Code: [email or web address] URL: [email or web address] Date: 08/21/2024 Prepared by:  Dayra Rapley  Exercises - Standing Hip Abduction with Counter Support  - 1 x daily - 7 x weekly - 2 sets - 5-10 reps - Standing Hip Extension with Counter Support  - 1 x daily - 7 x weekly - 2 sets - 5-10 reps - Mini Squat with Counter Support  - 1 x daily - 7 x weekly - 2 sets - 5-10 reps - Modified Thomas Stretch  - 1 x daily - 7 x weekly - 3 sets - 15-30s hold - Standing Hip Flexor Stretch  - 1 x daily - 7 x weekly - 3 sets - 15-30s hold  ASSESSMENT:  CLINICAL IMPRESSION: Patient arrived to session noting soreness in Lt hip and discussed not performing manual to either hip during session. Patient tolerated all activities and endorsed heaviness/tiredness in Lt LE during recumbent bike. Patient will continue to benefit from skilled PT.  OBJECTIVE IMPAIRMENTS: Abnormal gait, decreased activity tolerance, decreased balance, decreased coordination, decreased endurance, decreased mobility, difficulty walking, decreased ROM, decreased strength, impaired flexibility, improper body mechanics, postural dysfunction, obesity, and pain.   ACTIVITY LIMITATIONS: bending, sitting, standing, squatting, sleeping, stairs, transfers, and bed mobility  PARTICIPATION  LIMITATIONS: interpersonal relationship, community activity, and church  PERSONAL FACTORS: Past/current experiences, Time since onset of injury/illness/exacerbation, and 3+ comorbidities: anxiety, arthritis, cancer history, DM, GERD, HLD, HTN, kidney disease, ulcerative colitis, restless leg syndrome, lumbar disc disease  are also affecting patient's functional outcome.   REHAB POTENTIAL: Good  CLINICAL DECISION MAKING: Evolving/moderate complexity  EVALUATION COMPLEXITY: Moderate   GOALS: Goals reviewed with patient? Yes  SHORT TERM GOALS: Target date: 09/11/2024 Patient will show compliance with initial HEP.  Baseline: Goal status: GOAL MET, 09/13/2024  2.  Patient will report pain levels no greater than 7/10 to show overall improved quality of life. Baseline:  Goal status: goal ongoing, 09/13/2024   LONG TERM GOALS: Target date: 10/16/2024  Patient will be independent with final HEP in order to maintain and progress upon functional gains made within PT. Baseline:  Goal status: INITIAL  2.  Patient will report pain levels no greater than 4/10 to show overall improved quality of life. Baseline:  Goal status: INITIAL  3.  Patient will increase PSFS to at least 3.66 in order to show a significant improvement in subjective disability rating. Baseline:  Goal status: INITIAL  4.  Patient will increase bilat hip abduction strength to at least 4-/5 in order to improve biomechanics with functional mobility. Baseline:  Goal status: INITIAL  5.  Patient will increase bilat hip extension strength to at least 3+/5 in order to improve biomechanics with functional mobility. Baseline:  Goal status: INITIAL  6.  Patient will increase Rt hip flexion AROM to at least 90deg in order to improve functional mobility. Baseline:  Goal status: INITIAL  7.  Patient will increase 5xSTS to at least 16.23s in order to decrease chances of falling. Baseline:  Goal status:  INITIAL   PLAN:  PT FREQUENCY: 1-2x/week  PT DURATION: 8 weeks  PLANNED INTERVENTIONS: [id number]- PT Re-evaluation, 97750- Physical Performance Testing, 97110-Therapeutic exercises, 97530- Therapeutic activity, [id number]- Neuromuscular re-education, 97535- Self Care, [id number]- Manual therapy, [id number]- Gait training, [phone]- Orthotic Initial, [id number]- Orthotic/Prosthetic subsequent, [phone]- Canalith repositioning, [phone]- Aquatic Therapy, [phone]- Electrical stimulation (unattended), [phone]- Electrical stimulation (manual), [id number]- Vasopneumatic device, [id number]- Ultrasound, [id number]- Traction (mechanical), [id number]- Ionotophoresis 4mg /ml Dexamethasone , [id number] (1-2 muscles), [id number] (3+ muscles)- Dry Needling, Patient/Family education, Balance training, Stair training, Taping, Joint mobilization, Joint manipulation, Spinal manipulation, Spinal mobilization, Scar mobilization,  Compression bandaging, Vestibular training, DME instructions, Cryotherapy, and Moist heat  PLAN FOR NEXT SESSION:    hip flexor stretching, general hip/LE strengthening, balance (static and functional), eccentric hip flexion?   Susannah Daring, PT, DPT 09/20/24 10:17 AM

## 2024-09-20 ENCOUNTER — Encounter

## 2024-09-20 ENCOUNTER — Ambulatory Visit

## 2024-09-20 DIAGNOSIS — R293 Abnormal posture: Secondary | ICD-10-CM

## 2024-09-20 DIAGNOSIS — M25551 Pain in right hip: Secondary | ICD-10-CM | POA: Diagnosis not present

## 2024-09-20 DIAGNOSIS — R2681 Unsteadiness on feet: Secondary | ICD-10-CM

## 2024-09-20 DIAGNOSIS — R2689 Other abnormalities of gait and mobility: Secondary | ICD-10-CM | POA: Diagnosis not present

## 2024-09-20 DIAGNOSIS — M5459 Other low back pain: Secondary | ICD-10-CM

## 2024-09-20 DIAGNOSIS — M6281 Muscle weakness (generalized): Secondary | ICD-10-CM | POA: Diagnosis not present

## 2024-09-25 ENCOUNTER — Encounter

## 2024-09-26 DIAGNOSIS — M25551 Pain in right hip: Secondary | ICD-10-CM | POA: Diagnosis not present

## 2024-09-26 DIAGNOSIS — G2581 Restless legs syndrome: Secondary | ICD-10-CM | POA: Diagnosis not present

## 2024-09-26 DIAGNOSIS — M25552 Pain in left hip: Secondary | ICD-10-CM | POA: Diagnosis not present

## 2024-09-26 DIAGNOSIS — M47817 Spondylosis without myelopathy or radiculopathy, lumbosacral region: Secondary | ICD-10-CM | POA: Diagnosis not present

## 2024-09-26 NOTE — Therapy (Signed)
 OUTPATIENT PHYSICAL THERAPY TREATMENT   Patient Name: Lauren [name] MRN: [id number] DOB:04/24/1946, 78 y.o., female Today's Date: 10/01/2024  END OF SESSION:  PT End of Session - 10/01/24 1014     Visit Number 8    Number of Visits 16    Date for Recertification  10/16/24    Authorization Type [employer] $15 COPAY    Progress Note Due on Visit 10    PT Start Time 1016    PT Stop Time 1059    PT Time Calculation (min) 43 min    Activity Tolerance Patient tolerated treatment well;Patient limited by pain    Behavior During Therapy [facility] for tasks assessed/performed            Past Medical History:  Diagnosis Date   Anxiety    Arthritis    Back, Hip- right   Atypical chest pain 06/25/2021   Back pain    Cancer (HCC)    right breast   Cataract    Colitis    Coronary artery calcification 02/20/2020   Diabetes mellitus without complication (HCC)    Type II   Edema 03/05/2021   Edema of both lower extremities    Endometriosis    Family history of adverse reaction to anesthesia    Father - N/V - blood pressure; daughter N/V   Family history of breast cancer    Family history of colon cancer    Family history of kidney cancer    Family history of prostate cancer    Fatty liver    GERD (gastroesophageal reflux disease)    History of hiatal hernia    History of kidney stones    History of ulcerative colitis    Hypertension    Insomnia    Joint pain    Kidney problem    Lower extremity edema 03/05/2021   Lumbar disc disease    L4 L5   Neuropathy    Osteoarthritis    Other hyperlipidemia    Palpitations    Personal history of radiation therapy    Pneumonia    1983, 2003   Pure hypercholesterolemia 02/20/2020   PVCs (premature ventricular contractions)    benign   Umbilical hernia    Vitamin D  deficiency    Wears glasses    Past Surgical History:  Procedure Laterality Date   APPENDECTOMY     BREAST BIOPSY  08-13-2009  DR TSUEI   EXCISION LEFT  NIPPLE DUCT   BREAST EXCISIONAL BIOPSY Left    x2   BREAST EXCISIONAL BIOPSY Right    BREAST LUMPECTOMY Right 11/16/2018   invasive ductal   CHOLECYSTECTOMY  1992   COLON RESECTION  1986   ULCERATIVE COLITIS   COLON SURGERY     ESOPHAGEAL MANOMETRY N/A 06/11/2013   Procedure: ESOPHAGEAL MANOMETRY (EM);  Surgeon: Oliva FORBES Boots, MD;  Location: [facility];  Service: Endoscopy;  Laterality: N/A;   ESOPHAGOGASTRODUODENOSCOPY (EGD) WITH PROPOFOL  N/A 05/31/2016   Procedure: ESOPHAGOGASTRODUODENOSCOPY (EGD) WITH PROPOFOL ;  Surgeon: Oliva Boots, MD;  Location: [facility] ENDOSCOPY;  Service: Endoscopy;  Laterality: N/A;   EXCISION RIGHT BREAST MASS   10-20-2005  DR MAUDE YOUNG   EYE SURGERY Bilateral 2023   cataract   FRACTURE SURGERY     left arm   kidney stone removed  12/2009   KNEE ARTHROSCOPY Left    MCL   LEFT THUMB CARPOMETACARPAL JOINT SUSPENSIONPLASTY  04-06-2006  DR SISSY   LEFT WRIST ARTHROSCOPY W/ DEBRIDEMENT AND REMOVAL CYST  03-26-2004  DR SISSY   MYOMECTOMY     RADIOACTIVE SEED GUIDED PARTIAL MASTECTOMY WITH AXILLARY SENTINEL LYMPH NODE BIOPSY Right 11/16/2018   Procedure: RIGHT BREAST RADIOACTIVE SEED GUIDED PARTIAL MASTECTOMY WITH  SENTINEL  NODE BIOPSY;  Surgeon: Vernetta Berg, MD;  Location: [facility] OR;  Service: General;  Laterality: Right;   RIGHT COLECTOMY     RIGHT URETEROSCOPIC STONE EXTRACTION  12-11-2009  DR NORLEEN [facility]   TENDON RELEASE     TONSILLECTOMY     TOTAL HIP ARTHROPLASTY Right 11/26/2016   Procedure: RIGHT TOTAL HIP ARTHROPLASTY ANTERIOR APPROACH;  Surgeon: Lonni CINDERELLA Vernetta, MD;  Location: [facility] ORS;  Service: Orthopedics;  Laterality: Right;   TOTAL HIP ARTHROPLASTY Left 12/06/2023   Procedure: LEFT TOTAL HIP ARTHROPLASTY ANTERIOR APPROACH;  Surgeon: Vernetta Lonni CINDERELLA, MD;  Location: [facility] OR;  Service: Orthopedics;  Laterality: Left;   TOTAL KNEE ARTHROPLASTY Left 12/02/2017   Procedure: LEFT TOTAL KNEE ARTHROPLASTY;  Surgeon: Vernetta Lonni CINDERELLA,  MD;  Location: [facility] OR[facility];  Service: Orthopedics;  Laterality: Left;   TOTAL KNEE ARTHROPLASTY Right 08/03/2022   Procedure: RIGHT TOTAL KNEE ARTHROPLASTY;  Surgeon: Vernetta Lonni CINDERELLA, MD;  Location: [facility] OR;  Service: Orthopedics;  Laterality: Right;   WRIST SURGERY     both   Patient Active Problem List   Diagnosis Date Noted   Vaginal atrophy 06/29/2024   History of iron deficiency 05/24/2024   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   Falls 05/01/2024   Iron deficiency 05/01/2024   Overactive bladder 02/22/2024   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/2023   SUI (stress urinary incontinence, female) 11/22/2023   RLS (restless legs syndrome) 09/22/2023   Other hyperlipidemia 06/08/2023   Unilateral primary osteoarthritis, left hip 05/11/2023   Depression 03/16/2023   Allergic rhinitis 12/29/2022   Seasonal affective disorder 12/01/2022   BMI 32.0-32.9,adult 12/01/2022   Obesity, Beginning BMI 37.59 12/01/2022   OA (osteoarthritis) of knee 08/03/2022   Status post total right knee replacement 08/03/2022   Stress 07/19/2022   Other constipation 07/19/2022   Atypical chest pain 06/25/2021   Class 2 severe obesity with serious comorbidity and body mass index (BMI) of 37.0 to 37.9 in adult 06/03/2021   Vitamin D  deficiency 06/03/2021   Other fatigue 04/22/2021   SOB (shortness of breath) on exertion 04/22/2021   Lower extremity edema 03/05/2021   Coronary artery calcification 02/20/2020   Pure hypercholesterolemia 02/20/2020   Unilateral primary osteoarthritis, right knee 01/03/2019   Genetic testing 12/07/2018   Family history of breast cancer    Family history of prostate cancer    Family history of colon cancer    Family history of kidney cancer    Malignant neoplasm of upper-outer quadrant of right breast in female, 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/09/2018   Diabetic neuropathy, type II diabetes mellitus (HCC) 11/09/2018   Status post total left knee replacement  12/02/2017   Trochanteric bursitis, right hip 05/10/2017   Status post total replacement of right hip 11/26/2016   DDD (degenerative disc disease), 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   Low back pain 04/23/2015   Hiatal hernia 04/13/2013   Diabetes mellitus (HCC) 01/26/2013   Umbilical hernia 07/24/2012   Essential hypertension     PCP: Carlin Dale Gull, MD   REFERRING PROVIDER: Lonell Sprang, DO  REFERRING DIAG: M76.11 (ICD-10-CM) - Psoas tendinitis of right side M25.551,G89.29,Z96.641 (ICD-10-CM) - Chronic hip pain after total replacement of right hip joint R26.89 (ICD-10-CM) - Abnormality of gait due to impairment of balance M25.551,M25.552 (ICD-10-CM) - Bilateral  hip pain  THERAPY DIAG:  Pain in right hip  Other low back pain  Muscle weakness (generalized)  Abnormal posture  Unsteadiness on feet  Other abnormalities of gait and mobility  Rationale for Evaluation and Treatment: Rehabilitation  ONSET DATE: following HHPT for TKA 3 years ago   SUBJECTIVE:   SUBJECTIVE STATEMENT: Patient reporting doing fine and believes that increasing her walking has helped with her pain and soreness.   PERTINENT HISTORY: Patient has been experiencing Rt hip pain (mainly iliopsoas) and balance issues. Patient has received injections from Dr. Burnetta for iliopsoas pain that assisted with pain. Patient has undergone bilat TKA and bilat THA with no other injuries or surgeries to surrounding areas. Patient intermittently experiencing low back pain. Patient has experienced 5 falls in past year with 2 major falls leading to injuries; has since been cleared by medical team.  May need to check BP prior to sessions   See PMH or personal factors for in depth comorbidities    PAIN:  NPRS scale: not rated this session  Pain location: Rt hip  Pain description: sharp when moving, dull when sitting  Aggravating factors: stairs, into car, bed mobility/transfers  Relieving factors: heating pad, rest     PRECAUTIONS: Fall  RED FLAGS: Bowel or bladder incontinence: No and Cauda equina syndrome: No   WEIGHT BEARING RESTRICTIONS: No  FALLS:  Has patient fallen in last 6 months? Yes. Number of falls 5; 2 caused injuries (one from a potential medication issue that has since been changed, had on clogs and landed flat with arms by side hitting face, bruised ribs, etc.)  LIVING ENVIRONMENT: Lives with: lives alone Lives in: House/apartment Stairs: Yes: Internal: 1 steps; on right going up Has following equipment at home: Single point cane, Walker - 2 wheeled, and Grab bars  OCCUPATION: retired   PLOF: Independent  PATIENT GOALS: be more independent, reduce pain to be more active, get down on the floor and back   NEXT MD VISIT: not scheduled with Dr. Burnetta, follow up with Dr. Vernetta Sep 05, 2024  OBJECTIVE:  Note: Objective measures were completed at Evaluation unless otherwise noted.  DIAGNOSTIC FINDINGS:  An AP pelvis shows bilateral total hip arthroplasties with no complicating  features.   PATIENT SURVEYS:  PSFS: THE PATIENT SPECIFIC FUNCTIONAL SCALE  Place score of 0-10 (0 = unable to perform activity and 10 = able to perform activity at the same level as before injury or problem)  Activity Date: 08/21/2024    Get in and out of car   1    2. Get in and out of bed   1    3. Go up and down steps   3    4.      Total Score 1.66      Total Score = Sum of activity scores/number of activities  Minimally Detectable Change: 3 points (for single activity); 2 points (for average score)  [facility] (nd). The Patient Specific Functional Scale . Retrieved from [email or web address]   COGNITION: 08/21/2024 Overall cognitive status: Within functional limits for tasks assessed     SENSATION: 08/21/2024 Not tested  EDEMA:  08/21/2024 Not tested   MUSCLE LENGTH: 08/21/2024 Not formally assessed secondary  to pain   POSTURE:  08/21/2024 rounded shoulders, forward head, and increased thoracic kyphosis  PALPATION: 08/21/2024 TTP at Rt iliopsoas, Rt lateral glutes, Rt posterior glutes   LOWER EXTREMITY ROM:  ROM Right Eval 08/21/2024 Left Eval 08/21/2024  Hip flexion  AROM: 63deg PROM: ~95deg AROM: 110deg with back pain noted   Hip extension    Hip abduction    Hip adduction    Hip internal rotation    Hip external rotation    Knee flexion    Knee extension    Ankle dorsiflexion    Ankle plantarflexion    Ankle inversion    Ankle eversion     (Blank rows = not tested)  LOWER EXTREMITY MMT:  MMT Right Eval 08/21/2024 Left Eval 08/21/2024  Hip flexion (seated) 2+/5 3+/5  Hip extension (prone) 2+/5 3-/5  Hip abduction (sidelying) 2+/5 3+/5  Hip adduction    Hip internal rotation    Hip external rotation    Knee flexion(seated) 4+/5 5/5  Knee extension(seated) 3-/5 5/5  Ankle dorsiflexion(seated) 5/5 5/5  Ankle plantarflexion    Ankle inversion    Ankle eversion     (Blank rows = not tested)  LOWER EXTREMITY SPECIAL TESTS:  08/21/2024 No testing.    FUNCTIONAL TESTS:  08/21/2024 5 times sit to stand: 18.53s  GAIT: 08/21/2024 Distance walked: not formally assessed  Assistive device utilized: None Level of assistance: supervision  Comments: antalgic gait pattern    [facility] PT Assessment - 09/05/24 0001       Functional Gait  Assessment   Gait Level Surface Walks 20 ft, slow speed, abnormal gait pattern, evidence for imbalance or deviates 10-15 in outside of the 12 in walkway width. Requires more than 7 sec to ambulate 20 ft.    Change in Gait Speed Able to change speed, demonstrates mild gait deviations, deviates 6-10 in outside of the 12 in walkway width, or no gait deviations, unable to achieve a major change in velocity, or uses a change in velocity, or uses an assistive device.    Gait with Horizontal Head Turns Performs head turns with moderate changes  in gait velocity, slows down, deviates 10-15 in outside 12 in walkway width but recovers, can continue to walk.    Gait with Vertical Head Turns Performs task with slight change in gait velocity (eg, minor disruption to smooth gait path), deviates 6 - 10 in outside 12 in walkway width or uses assistive device    Gait and Pivot Turn Pivot turns safely in greater than 3 sec and stops with no loss of balance, or pivot turns safely within 3 sec and stops with mild imbalance, requires small steps to catch balance.    Step Over Obstacle Is able to step over one shoe box (4.5 in total height) but must slow down and adjust steps to clear box safely. May require verbal cueing.    Gait with Narrow Base of Support Ambulates less than 4 steps heel to toe or cannot perform without assistance.    Gait with Eyes Closed Cannot walk 20 ft without assistance, severe gait deviations or imbalance, deviates greater than 15 in outside 12 in walkway width or will not attempt task.    Ambulating Backwards Walks 20 ft, slow speed, abnormal gait pattern, evidence for imbalance, deviates 10-15 in outside 12 in walkway width.    Steps Alternating feet, must use rail.    Total Score 12    FGA comment: Max score 30  Interpretation: 25-28 = low risk fall   19-24 = medium risk fall  < 19 = high risk fall  TREATMENT        DATE: 10/01/2024 TherEx:  Recumbent bike level 3 for 8 minutes  Thomas/hip flexor stretch on mat table 5x45s with PT applying STM to proximal quad/hip flexor on Rt side  PT discussed POC and activities to perform over the next few weeks   Neuro Re-Ed:  [employer], fwd/back 1x12 each direction, lateral 2x12 each side  Lateral walks on foam pad with intermittent UE use on // bars ; decreased speed this date with improved performance compared to last session    TREATMENT        DATE: 09/20/2024 TherEx:  Recumbent bike level 3 for 8 minutes  Step taps to 4 step with bilat UE  support for stability to work on initiating knee and hip flexion 2x20 taps  Increased challenge to 6 step 1x20 with increased pain endorsed after 6 reps   Neuro Re-Ed: Narrow stance with horizontal head turns 1x20 turns  Semi tandem stance with horizontal head turns 2x20 turns bilaterally ; intermittent increased lateral sway and only one instance of lateral LOB requiring UE use on // bars  Lateral walks on foam pad with bilat UE use 1x4 down and back  PT discussed how to progress with stability and balance, how strength plays into balance   TREATMENT        DATE: 09/17/2024 TherEx:  Thomas/hip flexor stretch on mat table 5x45s with PT applying STM to proximal quad/hip flexor on Rt side  Thomas/hip flexor stretch on mat table 2x45s on Lt  UBE with bilat LE only level 2 for 8 minutes  PT discussed different stretches to perform for hip musculature (standing TFL/IT band stretch and standing lunge on step)  TherAct: Bilat leg press 3x8 with 75#  Lateral step ups with 4 step 2x8 each side with unilat UE support    TREATMENT        DATE: 09/13/2024 TherEx:  Nustep with bilat LE only level 4 for 8 minutes  Thomas/hip flexor stretch on mat table 5x45s with PT applying STM to proximal quad/hip flexor on Rt side  Thomas/hip flexor stretch on mat table 2x45s on Lt  Supine marches 2x16 ; attempted another set with PPT which increased pain levels so discontinued  Supine bridge with yellow ball between knees 2x8 with 2s hold  Supine clamshells 2x10 with 3s holds    TREATMENT        DATE: 09/10/2024 TherEx:  UBE with bilat LE only level 2 for 8 minutes  Thomas/hip flexor stretch on mat table 3x30s with PT applying slight STM to proximal quad/hip flexor  Attempted prone on elbows, though no stretch in hip flexor noted; held for 30s  Attempted sidelying Rt hip flexion with patient noting difficulty/slight pain, though increased tightness noted in Rt hip extensors  Supine single knee to chest  3x30s ; patient noting highly uncomfortable but not painful to do eccentric motion of return foot to table  Extension of Lt knee during last trial leading to increased pain in ant Rt hip then patient endorsing that she is starting to experience very mild pain in Lt ant hip similar to what she is experiencing in Rt ant hip   TherAct:  Step over and back with small hurdle using bilat UE support 2x12 with Lt LE only  Step taps to 2 step with Rt LE only 2x12 with unilat UE support and intermittently attempting no UE support       PATIENT EDUCATION:  Education details: HEP, POC,  interventions, strength and flexibility  Person educated: Patient Education method: Explanation, Demonstration, Tactile cues, Verbal cues, and Handouts Education comprehension: verbalized understanding, returned demonstration, verbal cues required, and tactile cues required  HOME EXERCISE PROGRAM: Access Code: [email or web address] URL: [email or web address] Date: 08/21/2024 Prepared by: Susannah Daring  Exercises - Standing Hip Abduction with Counter Support  - 1 x daily - 7 x weekly - 2 sets - 5-10 reps - Standing Hip Extension with Counter Support  - 1 x daily - 7 x weekly - 2 sets - 5-10 reps - Mini Squat with Counter Support  - 1 x daily - 7 x weekly - 2 sets - 5-10 reps - Modified Thomas Stretch  - 1 x daily - 7 x weekly - 3 sets - 15-30s hold - Standing Hip Flexor Stretch  - 1 x daily - 7 x weekly - 3 sets - 15-30s hold  ASSESSMENT:  CLINICAL IMPRESSION:  Patient arrived to session noting general overall improvements in Rt hip secondary to increase in ambulation. Patient tolerated all activities this date with an improvement seen in ROM when performing hip flexor stretch with STM. Patient will continue to benefit from skilled PT.  OBJECTIVE IMPAIRMENTS: Abnormal gait, decreased activity tolerance, decreased balance, decreased coordination, decreased endurance, decreased mobility, difficulty walking,  decreased ROM, decreased strength, impaired flexibility, improper body mechanics, postural dysfunction, obesity, and pain.   ACTIVITY LIMITATIONS: bending, sitting, standing, squatting, sleeping, stairs, transfers, and bed mobility  PARTICIPATION LIMITATIONS: interpersonal relationship, community activity, and church  PERSONAL FACTORS: Past/current experiences, Time since onset of injury/illness/exacerbation, and 3+ comorbidities: anxiety, arthritis, cancer history, DM, GERD, HLD, HTN, kidney disease, ulcerative colitis, restless leg syndrome, lumbar disc disease  are also affecting patient's functional outcome.   REHAB POTENTIAL: Good  CLINICAL DECISION MAKING: Evolving/moderate complexity  EVALUATION COMPLEXITY: Moderate   GOALS: Goals reviewed with patient? Yes  SHORT TERM GOALS: Target date: 09/11/2024 Patient will show compliance with initial HEP.  Baseline: Goal status: GOAL MET, 09/13/2024  2.  Patient will report pain levels no greater than 7/10 to show overall improved quality of life. Baseline:  Goal status: goal ongoing, 09/13/2024   LONG TERM GOALS: Target date: 10/16/2024  Patient will be independent with final HEP in order to maintain and progress upon functional gains made within PT. Baseline:  Goal status: INITIAL  2.  Patient will report pain levels no greater than 4/10 to show overall improved quality of life. Baseline:  Goal status: INITIAL  3.  Patient will increase PSFS to at least 3.66 in order to show a significant improvement in subjective disability rating. Baseline:  Goal status: INITIAL  4.  Patient will increase bilat hip abduction strength to at least 4-/5 in order to improve biomechanics with functional mobility. Baseline:  Goal status: INITIAL  5.  Patient will increase bilat hip extension strength to at least 3+/5 in order to improve biomechanics with functional mobility. Baseline:  Goal status: INITIAL  6.  Patient will increase Rt  hip flexion AROM to at least 90deg in order to improve functional mobility. Baseline:  Goal status: INITIAL  7.  Patient will increase 5xSTS to at least 16.23s in order to decrease chances of falling. Baseline:  Goal status: INITIAL   PLAN:  PT FREQUENCY: 1-2x/week  PT DURATION: 8 weeks  PLANNED INTERVENTIONS: [id number]- PT Re-evaluation, 97750- Physical Performance Testing, 97110-Therapeutic exercises, 97530- Therapeutic activity, [id number]- Neuromuscular re-education, 97535- Self Care, 02859- Manual therapy, [id number]- Gait training, [id number]- Orthotic Initial, [id number]- Orthotic/Prosthetic subsequent, [phone]- Canalith repositioning,  02886- Aquatic Therapy, [id number]- Electrical stimulation (unattended), [phone]- Electrical stimulation (manual), 02983- Vasopneumatic device, [id number]- Ultrasound, [id number]- Traction (mechanical), 02966- Ionotophoresis 4mg /ml Dexamethasone , 79439 (1-2 muscles), 20561 (3+ muscles)- Dry Needling, Patient/Family education, Balance training, Stair training, Taping, Joint mobilization, Joint manipulation, Spinal manipulation, Spinal mobilization, Scar mobilization, Compression bandaging, Vestibular training, DME instructions, Cryotherapy, and Moist heat  PLAN FOR NEXT SESSION:    hip flexor stretching, general hip/LE strengthening, balance (static and functional), eccentric hip flexion?   Susannah Daring, PT, DPT 10/01/24 12:55 PM

## 2024-10-01 ENCOUNTER — Ambulatory Visit

## 2024-10-01 DIAGNOSIS — M25551 Pain in right hip: Secondary | ICD-10-CM

## 2024-10-01 DIAGNOSIS — M5459 Other low back pain: Secondary | ICD-10-CM

## 2024-10-01 DIAGNOSIS — M6281 Muscle weakness (generalized): Secondary | ICD-10-CM

## 2024-10-01 DIAGNOSIS — R2689 Other abnormalities of gait and mobility: Secondary | ICD-10-CM

## 2024-10-01 DIAGNOSIS — R2681 Unsteadiness on feet: Secondary | ICD-10-CM

## 2024-10-01 DIAGNOSIS — R293 Abnormal posture: Secondary | ICD-10-CM

## 2024-10-02 ENCOUNTER — Encounter

## 2024-10-03 ENCOUNTER — Encounter: Admitting: Physical Therapy

## 2024-10-03 ENCOUNTER — Encounter

## 2024-10-04 ENCOUNTER — Encounter (INDEPENDENT_AMBULATORY_CARE_PROVIDER_SITE_OTHER): Payer: Self-pay | Admitting: Family Medicine

## 2024-10-04 ENCOUNTER — Telehealth (INDEPENDENT_AMBULATORY_CARE_PROVIDER_SITE_OTHER): Payer: Self-pay | Admitting: Family Medicine

## 2024-10-04 VITALS — Ht 63.0 in | Wt 176.0 lb

## 2024-10-04 DIAGNOSIS — Z7985 Long-term (current) use of injectable non-insulin antidiabetic drugs: Secondary | ICD-10-CM | POA: Diagnosis not present

## 2024-10-04 DIAGNOSIS — F3289 Other specified depressive episodes: Secondary | ICD-10-CM | POA: Diagnosis not present

## 2024-10-04 DIAGNOSIS — E119 Type 2 diabetes mellitus without complications: Secondary | ICD-10-CM | POA: Diagnosis not present

## 2024-10-04 DIAGNOSIS — G4733 Obstructive sleep apnea (adult) (pediatric): Secondary | ICD-10-CM

## 2024-10-04 DIAGNOSIS — Z6831 Body mass index (BMI) 31.0-31.9, adult: Secondary | ICD-10-CM

## 2024-10-04 DIAGNOSIS — E669 Obesity, unspecified: Secondary | ICD-10-CM

## 2024-10-04 DIAGNOSIS — E1140 Type 2 diabetes mellitus with diabetic neuropathy, unspecified: Secondary | ICD-10-CM

## 2024-10-04 DIAGNOSIS — F5089 Other specified eating disorder: Secondary | ICD-10-CM | POA: Diagnosis not present

## 2024-10-04 MED ORDER — TIRZEPATIDE 7.5 MG/0.5ML ~~LOC~~ SOAJ: 7.5000 mg | SUBCUTANEOUS | 0 refills | Status: AC

## 2024-10-04 MED ORDER — ESCITALOPRAM OXALATE 20 MG PO TABS: 20.0000 mg | ORAL_TABLET | Freq: Every day | ORAL | 0 refills | Status: AC

## 2024-10-04 NOTE — Progress Notes (Signed)
 Office: [phone]  /  Fax: [phone]  WEIGHT SUMMARY AND BIOMETRICS  Anthropometric Measurements Height: 5' 3 (1.6 m) Weight: 176 lb (79.8 kg) (last ov) BMI (Calculated): 31.18 Weight at Last Visit: 176 lb Weight Lost Since Last Visit: 0 Weight Gained Since Last Visit: 0 Starting Weight: 219 lb Total Weight Loss (lbs): 43 lb (19.5 kg) Peak Weight: 222 lb   No data recorded Other Clinical Data Fasting: no Labs: no Today's Visit #: 42 Starting Date: 04/22/21 Comments: my chart video visit    Chief Complaint: OBESITY  Virtual Visit via A/V Note  I connected with Montie GORMAN Sierras on 10/04/24 at  9:20 AM EST by audiovisual telehealth and verified that I am speaking with the correct person using two identifiers.  Location: Patient: home Provider: clinic   I discussed the limitations, risks, security and privacy concerns of performing an evaluation and management service by AV telehealth and the availability of in person appointments. I also discussed with the patient that there may be a patient responsible charge related to this service. The patient expressed understanding and agreed to proceed.  History of Present Illness  Seth Friedlander is a 78 year old female who presents for a follow-up on her obesity treatment plan.  She adheres to the category 2 eating plan for her obesity treatment about 30% of the time. She is attempting to increase her intake of fruits and vegetables but is not meeting her protein goals. She has gained two pounds in the last month since her last visit.  She is currently engaging in physical therapy twice a week as part of her exercise regimen. Her diet includes yogurt for breakfast and attempts to incorporate cheese and sausage with wheat crackers for lunch. She also mentioned having a bowl of tomato basil soup and a salad during a recent lunch outing.  She experiences stress due to a recent death in the family, impacting her  eating habits and leading to increased consumption of sweets. She finds portion control challenging and is tempted by comfort foods, especially during the holiday season.  She is currently taking Mounjaro , which she administers on Wednesdays, and Lexapro , which she still has a supply of. She uses a CPAP machine, achieving at least four hours of use per night, sometimes up to six hours.  She reports holding her breath at night, causing her SaO2 to drop to about eighty-five%.      PHYSICAL EXAM:  Height 5' 3 (1.6 m), weight 176 lb (79.8 kg). Body mass index is 31.18 kg/m.  DIAGNOSTIC DATA REVIEWED:  BMET    Component Value Date/Time   NA 140 05/24/2024 1032   K 3.5 05/24/2024 1032   CL 101 05/24/2024 1032   CO2 21 05/24/2024 1032   GLUCOSE 68 (L) 05/24/2024 1032   GLUCOSE 118 (H) 12/07/2023 0736   BUN 7 (L) 05/24/2024 1032   CREATININE 0.81 05/24/2024 1032   CREATININE 0.87 06/08/2022 1439   CALCIUM  9.3 05/24/2024 1032   GFRNONAA 60 (L) 12/07/2023 0736   GFRNONAA >60 06/08/2022 1439   GFRAA >60 02/04/2020 1212   GFRAA >60 11/09/2018 1405   Lab Results  Component Value Date   HGBA1C 6.0 (H) 06/08/2023   HGBA1C 6.7 (H) 11/16/2016   Lab Results  Component Value Date   INSULIN  25.6 (H) 06/08/2023   INSULIN  22.3 04/22/2021   Lab Results  Component Value Date   TSH 2.450 04/11/2024   CBC    Component Value Date/Time  WBC 10.9 (H) 11/30/2023 1347   RBC 4.25 11/30/2023 1347   HGB 12.4 11/30/2023 1347   HGB 12.3 06/08/2022 1439   HGB 11.8 05/11/2022 0954   HCT 37.5 11/30/2023 1347   HCT 35.1 05/11/2022 0954   PLT 327 11/30/2023 1347   PLT 272 06/08/2022 1439   PLT 265 05/11/2022 0954   MCV 88.2 11/30/2023 1347   MCV 87 05/11/2022 0954   MCH 29.2 11/30/2023 1347   MCHC 33.1 11/30/2023 1347   RDW 13.3 11/30/2023 1347   RDW 13.2 05/11/2022 0954   Iron Studies    Component Value Date/Time   IRON 61 06/28/2024 0918   TIBC 292 06/28/2024 0918   FERRITIN 82  06/28/2024 0918   IRONPCTSAT 21 06/28/2024 0918   Lipid Panel     Component Value Date/Time   CHOL 123 06/08/2023 0941   TRIG 75 06/08/2023 0941   HDL 67 06/08/2023 0941   LDLCALC 41 06/08/2023 0941   Hepatic Function Panel     Component Value Date/Time   PROT 6.6 05/24/2024 1032   ALBUMIN  4.3 05/24/2024 1032   AST 17 05/24/2024 1032   AST 16 06/08/2022 1439   ALT 12 05/24/2024 1032   ALT 12 06/08/2022 1439   ALKPHOS 72 05/24/2024 1032   BILITOT 0.4 05/24/2024 1032   BILITOT 0.4 06/08/2022 1439   BILIDIR <0.1 (L) 07/30/2015 1338   IBILI NOT CALCULATED 07/30/2015 1338      Component Value Date/Time   TSH 2.450 04/11/2024 1208   TSH 2.150 05/11/2022 0954   Nutritional Lab Results  Component Value Date   VD25OH 57.5 02/07/2024   VD25OH 124.0 (H) 09/22/2023   VD25OH 100.0 06/08/2023     Assessment and Plan Assessment & Plan  Obesity and DM II and OSA Management is ongoing with a focus on dietary modifications and physical activity. She has been following the category 2 plan about 30% of the time, working on increasing fruit and vegetable intake, but not meeting protein goals. She has gained two pounds in the last month. Stress and recent bereavement may be impacting dietary habits. She is using CPAP for sleep apnea, which may aid in weight management by improving oxygenation and metabolism. - Continue category 2  plan with emphasis on increasing protein intake. - Encouraged portion control and mindful eating, especially during stressful periods. - Refilled Mounjaro  prescription for 90 days to ensure continuous treatment. - Continue CPAP use for at least four hours per night to improve oxygenation and metabolism.  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 She is experiencing increased stress due to recent bereavement, which may exacerbate depressive symptoms. She is not currently taking Flonase  due to other treatments. - Refilled Lexapro   prescription to ensure continuous treatment.      Patients who are on anti-obesity medications are counseled on the importance of maintaining healthy lifestyle habits, including balanced nutrition, regular physical activity, and behavioral modifications,  Medication is an adjunct to, not a replacement for, lifestyle changes and that the long-term success and weight maintenance depend on continued adherence to these strategies.   Lulla was informed of the importance of frequent follow up visits to maximize her success with intensive lifestyle modifications for her obesity and obesity related health conditions as recommended by USPSTF and CMS guidelines  Louann Penton, MD

## 2024-10-05 NOTE — Therapy (Signed)
 OUTPATIENT PHYSICAL THERAPY TREATMENT   Patient Name: Lauren [name] MRN: [id number] DOB:[birth year], 78 y.o., female Today's Date: 10/08/2024  END OF SESSION:  PT End of Session - 10/08/24 1009     Visit Number 9    Number of Visits 16    Date for Recertification  10/16/24    Authorization Type [employer] $15 COPAY    Progress Note Due on Visit 10    PT Start Time 1015    PT Stop Time 1100    PT Time Calculation (min) 45 min    Activity Tolerance Patient tolerated treatment well;Patient limited by pain    Behavior During Therapy [facility] for tasks assessed/performed           Past Medical History:  Diagnosis Date   Anxiety    Arthritis    Back, Hip- right   Atypical chest pain 06/25/2021   Back pain    Cancer (HCC)    right breast   Cataract    Colitis    Coronary artery calcification 02/20/2020   Diabetes mellitus without complication (HCC)    Type II   Edema 03/05/2021   Edema of both lower extremities    Endometriosis    Family history of adverse reaction to anesthesia    Father - N/V - blood pressure; daughter N/V   Family history of breast cancer    Family history of colon cancer    Family history of kidney cancer    Family history of prostate cancer    Fatty liver    GERD (gastroesophageal reflux disease)    History of hiatal hernia    History of kidney stones    History of ulcerative colitis    Hypertension    Insomnia    Joint pain    Kidney problem    Lower extremity edema 03/05/2021   Lumbar disc disease    L4 L5   Neuropathy    Osteoarthritis    Other hyperlipidemia    Palpitations    Personal history of radiation therapy    Pneumonia    1983, 2003   Pure hypercholesterolemia 02/20/2020   PVCs (premature ventricular contractions)    benign   Umbilical hernia    Vitamin D  deficiency    Wears glasses    Past Surgical History:  Procedure Laterality Date   APPENDECTOMY     BREAST BIOPSY  08-13-2009  DR TSUEI   EXCISION LEFT NIPPLE  DUCT   BREAST EXCISIONAL BIOPSY Left    x2   BREAST EXCISIONAL BIOPSY Right    BREAST LUMPECTOMY Right 11/16/2018   invasive ductal   CHOLECYSTECTOMY  1992   COLON RESECTION  1986   ULCERATIVE COLITIS   COLON SURGERY     ESOPHAGEAL MANOMETRY N/A 06/11/2013   Procedure: ESOPHAGEAL MANOMETRY (EM);  Surgeon: Oliva FORBES Boots, MD;  Location: [facility] ENDOSCOPY;  Service: Endoscopy;  Laterality: N/A;   ESOPHAGOGASTRODUODENOSCOPY (EGD) WITH PROPOFOL  N/A 05/31/2016   Procedure: ESOPHAGOGASTRODUODENOSCOPY (EGD) WITH PROPOFOL ;  Surgeon: Oliva Boots, MD;  Location: [facility] ENDOSCOPY;  Service: Endoscopy;  Laterality: N/A;   EXCISION RIGHT BREAST MASS   10-20-2005  DR MAUDE YOUNG   EYE SURGERY Bilateral 2023   cataract   FRACTURE SURGERY     left arm   kidney stone removed  12/2009   KNEE ARTHROSCOPY Left    MCL   LEFT THUMB CARPOMETACARPAL JOINT SUSPENSIONPLASTY  04-06-2006  DR SISSY   LEFT WRIST ARTHROSCOPY W/ DEBRIDEMENT AND REMOVAL CYST  03-26-2004  DR  WEINGOLD   MYOMECTOMY     RADIOACTIVE SEED GUIDED PARTIAL MASTECTOMY WITH AXILLARY SENTINEL LYMPH NODE BIOPSY Right 11/16/2018   Procedure: RIGHT BREAST RADIOACTIVE SEED GUIDED PARTIAL MASTECTOMY WITH  SENTINEL  NODE BIOPSY;  Surgeon: Vernetta Berg, MD;  Location: [facility] OR;  Service: General;  Laterality: Right;   RIGHT COLECTOMY     RIGHT URETEROSCOPIC STONE EXTRACTION  12-11-2009  DR NORLEEN [facility]   TENDON RELEASE     TONSILLECTOMY     TOTAL HIP ARTHROPLASTY Right 11/26/2016   Procedure: RIGHT TOTAL HIP ARTHROPLASTY ANTERIOR APPROACH;  Surgeon: Lonni CINDERELLA Vernetta, MD;  Location: [facility] ORS;  Service: Orthopedics;  Laterality: Right;   TOTAL HIP ARTHROPLASTY Left 12/06/2023   Procedure: LEFT TOTAL HIP ARTHROPLASTY ANTERIOR APPROACH;  Surgeon: Vernetta Lonni CINDERELLA, MD;  Location: [facility] OR;  Service: Orthopedics;  Laterality: Left;   TOTAL KNEE ARTHROPLASTY Left 12/02/2017   Procedure: LEFT TOTAL KNEE ARTHROPLASTY;  Surgeon: Vernetta Lonni CINDERELLA, MD;   Location: [facility] ORS;  Service: Orthopedics;  Laterality: Left;   TOTAL KNEE ARTHROPLASTY Right 08/03/2022   Procedure: RIGHT TOTAL KNEE ARTHROPLASTY;  Surgeon: Vernetta Lonni CINDERELLA, MD;  Location: [facility] OR;  Service: Orthopedics;  Laterality: Right;   WRIST SURGERY     both   Patient Active Problem List   Diagnosis Date Noted   OSA on CPAP 10/04/2024   Vaginal atrophy 06/29/2024   History of iron deficiency 05/24/2024   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   Falls 05/01/2024   Iron deficiency 05/01/2024   Overactive bladder 02/22/2024   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/2023   SUI (stress urinary incontinence, female) 11/22/2023   RLS (restless legs syndrome) 09/22/2023   Other hyperlipidemia 06/08/2023   Unilateral primary osteoarthritis, left hip 05/11/2023   Depression 03/16/2023   Allergic rhinitis 12/29/2022   Seasonal affective disorder 12/01/2022   BMI 32.0-32.9,adult 12/01/2022   Obesity, Beginning BMI 37.59 12/01/2022   OA (osteoarthritis) of knee 08/03/2022   Status post total right knee replacement 08/03/2022   Stress 07/19/2022   Other constipation 07/19/2022   Atypical chest pain 06/25/2021   Class 2 severe obesity with serious comorbidity and body mass index (BMI) of 37.0 to 37.9 in adult 06/03/2021   Vitamin D  deficiency 06/03/2021   Other fatigue 04/22/2021   SOB (shortness of breath) on exertion 04/22/2021   Lower extremity edema 03/05/2021   Coronary artery calcification 02/20/2020   Pure hypercholesterolemia 02/20/2020   Unilateral primary osteoarthritis, right knee 01/03/2019   Genetic testing 12/07/2018   Family history of breast cancer    Family history of prostate cancer    Family history of colon cancer    Family history of kidney cancer    Malignant neoplasm of upper-outer quadrant of right breast in female, 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/09/2018   Diabetic neuropathy, type II diabetes mellitus (HCC) 11/09/2018   Status post total  left knee replacement 12/02/2017   Trochanteric bursitis, right hip 05/10/2017   Status post total replacement of right hip 11/26/2016   DDD (degenerative disc disease), 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   Low back pain 04/23/2015   Hiatal hernia 04/13/2013   Diabetes mellitus (HCC) 01/26/2013   Umbilical hernia 07/24/2012   Essential hypertension     PCP: Carlin Dale Gull, MD   REFERRING PROVIDER: Lonell Sprang, DO  REFERRING DIAG: M76.11 (ICD-10-CM) - Psoas tendinitis of right side M25.551,G89.29,Z96.641 (ICD-10-CM) - Chronic hip pain after total replacement of right hip joint R26.89 (ICD-10-CM) - Abnormality of gait due to impairment of  balance M25.551,M25.552 (ICD-10-CM) - Bilateral hip pain  THERAPY DIAG:  Pain in right hip  Other low back pain  Muscle weakness (generalized)  Abnormal posture  Unsteadiness on feet  Other abnormalities of gait and mobility  Rationale for Evaluation and Treatment: Rehabilitation  ONSET DATE: following HHPT for TKA 3 years ago   SUBJECTIVE:   SUBJECTIVE STATEMENT: Patient reporting improvement in pain in Rt hip overall, though did experience a trip and fall over the weekend and would like to focus on continued care with balance.   PERTINENT HISTORY: Patient has been experiencing Rt hip pain (mainly iliopsoas) and balance issues. Patient has received injections from Dr. Burnetta for iliopsoas pain that assisted with pain. Patient has undergone bilat TKA and bilat THA with no other injuries or surgeries to surrounding areas. Patient intermittently experiencing low back pain. Patient has experienced 5 falls in past year with 2 major falls leading to injuries; has since been cleared by medical team.  May need to check BP prior to sessions   See PMH or personal factors for in depth comorbidities    PAIN:  NPRS scale: not rated this session  Pain location: Rt hip  Pain description: sharp when moving, dull when sitting  Aggravating factors: stairs,  into car, bed mobility/transfers  Relieving factors: heating pad, rest    PRECAUTIONS: Fall  RED FLAGS: Bowel or bladder incontinence: No and Cauda equina syndrome: No   WEIGHT BEARING RESTRICTIONS: No  FALLS:  Has patient fallen in last 6 months? Yes. Number of falls 5; 2 caused injuries (one from a potential medication issue that has since been changed, had on clogs and landed flat with arms by side hitting face, bruised ribs, etc.)  LIVING ENVIRONMENT: Lives with: lives alone Lives in: House/apartment Stairs: Yes: Internal: 1 steps; on right going up Has following equipment at home: Single point cane, Walker - 2 wheeled, and Grab bars  OCCUPATION: retired   PLOF: Independent  PATIENT GOALS: be more independent, reduce pain to be more active, get down on the floor and back   NEXT MD VISIT: not scheduled with Dr. Burnetta, follow up with Dr. Vernetta Sep 05, 2024  OBJECTIVE:  Note: Objective measures were completed at Evaluation unless otherwise noted.  DIAGNOSTIC FINDINGS:  An AP pelvis shows bilateral total hip arthroplasties with no complicating  features.   PATIENT SURVEYS:  PSFS: THE PATIENT SPECIFIC FUNCTIONAL SCALE  Place score of 0-10 (0 = unable to perform activity and 10 = able to perform activity at the same level as before injury or problem)  Activity Date: 08/21/2024    Get in and out of car   1    2. Get in and out of bed   1    3. Go up and down steps   3    4.      Total Score 1.66      Total Score = Sum of activity scores/number of activities  Minimally Detectable Change: 3 points (for single activity); 2 points (for average score)  [facility] (nd). The Patient Specific Functional Scale . Retrieved from [email or web address]   COGNITION: 08/21/2024 Overall cognitive status: Within functional limits for tasks assessed     SENSATION: 08/21/2024 Not tested  EDEMA:  08/21/2024 Not  tested   MUSCLE LENGTH: 08/21/2024 Not formally assessed secondary to pain   POSTURE:  08/21/2024 rounded shoulders, forward head, and increased thoracic kyphosis  PALPATION: 08/21/2024 TTP at Rt iliopsoas, Rt lateral glutes, Rt posterior  glutes   LOWER EXTREMITY ROM:  ROM Right Eval 08/21/2024 Left Eval 08/21/2024  Hip flexion AROM: 63deg PROM: ~95deg AROM: 110deg with back pain noted   Hip extension    Hip abduction    Hip adduction    Hip internal rotation    Hip external rotation    Knee flexion    Knee extension    Ankle dorsiflexion    Ankle plantarflexion    Ankle inversion    Ankle eversion     (Blank rows = not tested)  LOWER EXTREMITY MMT:  MMT Right Eval 08/21/2024 Left Eval 08/21/2024  Hip flexion (seated) 2+/5 3+/5  Hip extension (prone) 2+/5 3-/5  Hip abduction (sidelying) 2+/5 3+/5  Hip adduction    Hip internal rotation    Hip external rotation    Knee flexion(seated) 4+/5 5/5  Knee extension(seated) 3-/5 5/5  Ankle dorsiflexion(seated) 5/5 5/5  Ankle plantarflexion    Ankle inversion    Ankle eversion     (Blank rows = not tested)  LOWER EXTREMITY SPECIAL TESTS:  08/21/2024 No testing.    FUNCTIONAL TESTS:  08/21/2024 5 times sit to stand: 18.53s  GAIT: 08/21/2024 Distance walked: not formally assessed  Assistive device utilized: None Level of assistance: supervision  Comments: antalgic gait pattern    [facility] PT Assessment - 09/05/24 0001       Functional Gait  Assessment   Gait Level Surface Walks 20 ft, slow speed, abnormal gait pattern, evidence for imbalance or deviates 10-15 in outside of the 12 in walkway width. Requires more than 7 sec to ambulate 20 ft.    Change in Gait Speed Able to change speed, demonstrates mild gait deviations, deviates 6-10 in outside of the 12 in walkway width, or no gait deviations, unable to achieve a major change in velocity, or uses a change in velocity, or uses an assistive device.     Gait with Horizontal Head Turns Performs head turns with moderate changes in gait velocity, slows down, deviates 10-15 in outside 12 in walkway width but recovers, can continue to walk.    Gait with Vertical Head Turns Performs task with slight change in gait velocity (eg, minor disruption to smooth gait path), deviates 6 - 10 in outside 12 in walkway width or uses assistive device    Gait and Pivot Turn Pivot turns safely in greater than 3 sec and stops with no loss of balance, or pivot turns safely within 3 sec and stops with mild imbalance, requires small steps to catch balance.    Step Over Obstacle Is able to step over one shoe box (4.5 in total height) but must slow down and adjust steps to clear box safely. May require verbal cueing.    Gait with Narrow Base of Support Ambulates less than 4 steps heel to toe or cannot perform without assistance.    Gait with Eyes Closed Cannot walk 20 ft without assistance, severe gait deviations or imbalance, deviates greater than 15 in outside 12 in walkway width or will not attempt task.    Ambulating Backwards Walks 20 ft, slow speed, abnormal gait pattern, evidence for imbalance, deviates 10-15 in outside 12 in walkway width.    Steps Alternating feet, must use rail.    Total Score 12    FGA comment: Max score 30  Interpretation: 25-28 = low risk fall   19-24 = medium risk fall  < 19 = high risk fall  TREATMENT        DATE: 10/08/2024 TherEx:  Recumbent bike level 4 for 8 minutes  Thomas/hip flexor stretch on mat table 3x45s with PT applying STM to proximal quad/hip flexor on Rt side  Patient noting discomfort/pain in lateral thigh during last round   Neuro Re-Ed:  Reciprocal step taps to 6 steps 2x10 with intermittent UE use on parallel bars  No pain noted with activity  Rocker board fwd/back to focus on control and ankle strategy 2x20 taps  Rocker board lateral to focus on control and ankle/hip 1x20 taps  PT  discussed balance strategies and how they are challenged with each activity above    TREATMENT        DATE: 10/01/2024 TherEx:  Recumbent bike level 3 for 8 minutes  Thomas/hip flexor stretch on mat table 5x45s with PT applying STM to proximal quad/hip flexor on Rt side  PT discussed POC and activities to perform over the next few weeks   Neuro Re-Ed:  [employer], fwd/back 1x12 each direction, lateral 2x12 each side  Lateral walks on foam pad with intermittent UE use on // bars ; decreased speed this date with improved performance compared to last session    TREATMENT        DATE: 09/20/2024 TherEx:  Recumbent bike level 3 for 8 minutes  Step taps to 4 step with bilat UE support for stability to work on initiating knee and hip flexion 2x20 taps  Increased challenge to 6 step 1x20 with increased pain endorsed after 6 reps   Neuro Re-Ed: Narrow stance with horizontal head turns 1x20 turns  Semi tandem stance with horizontal head turns 2x20 turns bilaterally ; intermittent increased lateral sway and only one instance of lateral LOB requiring UE use on // bars  Lateral walks on foam pad with bilat UE use 1x4 down and back  PT discussed how to progress with stability and balance, how strength plays into balance   TREATMENT        DATE: 09/17/2024 TherEx:  Thomas/hip flexor stretch on mat table 5x45s with PT applying STM to proximal quad/hip flexor on Rt side  Thomas/hip flexor stretch on mat table 2x45s on Lt  UBE with bilat LE only level 2 for 8 minutes  PT discussed different stretches to perform for hip musculature (standing TFL/IT band stretch and standing lunge on step)  TherAct: Bilat leg press 3x8 with 75#  Lateral step ups with 4 step 2x8 each side with unilat UE support     PATIENT EDUCATION:  Education details: HEP, POC, interventions, strength and flexibility  Person educated: Patient Education method: Explanation, Demonstration, Tactile cues, Verbal cues, and  Handouts Education comprehension: verbalized understanding, returned demonstration, verbal cues required, and tactile cues required  HOME EXERCISE PROGRAM: Access Code: [email or web address] URL: [email or web address] Date: 08/21/2024 Prepared by: Susannah Daring  Exercises - Standing Hip Abduction with Counter Support  - 1 x daily - 7 x weekly - 2 sets - 5-10 reps - Standing Hip Extension with Counter Support  - 1 x daily - 7 x weekly - 2 sets - 5-10 reps - Mini Squat with Counter Support  - 1 x daily - 7 x weekly - 2 sets - 5-10 reps - Modified Thomas Stretch  - 1 x daily - 7 x weekly - 3 sets - 15-30s hold - Standing Hip Flexor Stretch  - 1 x daily - 7 x weekly - 3 sets - 15-30s hold  ASSESSMENT:  CLINICAL IMPRESSION:  Patient  arrived to session noting improved Rt hip symptoms, though continues to have difficulty with balance issues. Patient tolerated all activities this date and will benefit from focus on balance and dual tasking with ambulation. Patient will continue to benefit from skilled PT.  OBJECTIVE IMPAIRMENTS: Abnormal gait, decreased activity tolerance, decreased balance, decreased coordination, decreased endurance, decreased mobility, difficulty walking, decreased ROM, decreased strength, impaired flexibility, improper body mechanics, postural dysfunction, obesity, and pain.   ACTIVITY LIMITATIONS: bending, sitting, standing, squatting, sleeping, stairs, transfers, and bed mobility  PARTICIPATION LIMITATIONS: interpersonal relationship, community activity, and church  PERSONAL FACTORS: Past/current experiences, Time since onset of injury/illness/exacerbation, and 3+ comorbidities: anxiety, arthritis, cancer history, DM, GERD, HLD, HTN, kidney disease, ulcerative colitis, restless leg syndrome, lumbar disc disease  are also affecting patient's functional outcome.   REHAB POTENTIAL: Good  CLINICAL DECISION MAKING: Evolving/moderate complexity  EVALUATION COMPLEXITY:  Moderate   GOALS: Goals reviewed with patient? Yes  SHORT TERM GOALS: Target date: 09/11/2024 Patient will show compliance with initial HEP.  Baseline: Goal status: GOAL MET, 09/13/2024  2.  Patient will report pain levels no greater than 7/10 to show overall improved quality of life. Baseline:  Goal status: goal ongoing, 09/13/2024   LONG TERM GOALS: Target date: 10/16/2024  Patient will be independent with final HEP in order to maintain and progress upon functional gains made within PT. Baseline:  Goal status: INITIAL  2.  Patient will report pain levels no greater than 4/10 to show overall improved quality of life. Baseline:  Goal status: INITIAL  3.  Patient will increase PSFS to at least 3.66 in order to show a significant improvement in subjective disability rating. Baseline:  Goal status: INITIAL  4.  Patient will increase bilat hip abduction strength to at least 4-/5 in order to improve biomechanics with functional mobility. Baseline:  Goal status: INITIAL  5.  Patient will increase bilat hip extension strength to at least 3+/5 in order to improve biomechanics with functional mobility. Baseline:  Goal status: INITIAL  6.  Patient will increase Rt hip flexion AROM to at least 90deg in order to improve functional mobility. Baseline:  Goal status: INITIAL  7.  Patient will increase 5xSTS to at least 16.23s in order to decrease chances of falling. Baseline:  Goal status: INITIAL   PLAN:  PT FREQUENCY: 1-2x/week  PT DURATION: 8 weeks  PLANNED INTERVENTIONS: [id number]- PT Re-evaluation, 97750- Physical Performance Testing, 97110-Therapeutic exercises, 97530- Therapeutic activity, [id number]- Neuromuscular re-education, 97535- Self Care, [id number]- Manual therapy, [id number]- Gait training, [phone]- Orthotic Initial, [phone]- Orthotic/Prosthetic subsequent, [phone]- Canalith repositioning, [phone]- Aquatic Therapy, [phone]- Electrical stimulation (unattended), [phone]- Electrical stimulation  (manual), [id number]- Vasopneumatic device, [id number]- Ultrasound, [id number]- Traction (mechanical), [id number]- Ionotophoresis 4mg /ml Dexamethasone , [id number] (1-2 muscles), [id number] (3+ muscles)- Dry Needling, Patient/Family education, Balance training, Stair training, Taping, Joint mobilization, Joint manipulation, Spinal manipulation, Spinal mobilization, Scar mobilization, Compression bandaging, Vestibular training, DME instructions, Cryotherapy, and Moist heat  PLAN FOR NEXT SESSION:   RECERT,   hip flexor stretching, general hip/LE strengthening, balance (static and functional), eccentric hip flexion?   Susannah Daring, PT, DPT 10/08/24 1:10 PM

## 2024-10-08 ENCOUNTER — Ambulatory Visit

## 2024-10-08 DIAGNOSIS — R293 Abnormal posture: Secondary | ICD-10-CM

## 2024-10-08 DIAGNOSIS — M25551 Pain in right hip: Secondary | ICD-10-CM

## 2024-10-08 DIAGNOSIS — M5459 Other low back pain: Secondary | ICD-10-CM

## 2024-10-08 DIAGNOSIS — R2681 Unsteadiness on feet: Secondary | ICD-10-CM

## 2024-10-08 DIAGNOSIS — M6281 Muscle weakness (generalized): Secondary | ICD-10-CM | POA: Diagnosis not present

## 2024-10-08 DIAGNOSIS — R2689 Other abnormalities of gait and mobility: Secondary | ICD-10-CM | POA: Diagnosis not present

## 2024-10-09 ENCOUNTER — Ambulatory Visit: Admitting: Adult Health

## 2024-10-09 NOTE — Therapy (Signed)
 OUTPATIENT PHYSICAL THERAPY TREATMENT   Patient Name: TENNYSON KALLEN MRN: [id number] DOB:July 22, 1946, 78 y.o., female Today's Date: 10/09/2024  END OF SESSION:     Past Medical History:  Diagnosis Date   Anxiety    Arthritis    Back, Hip- right   Atypical chest pain 06/25/2021   Back pain    Cancer (HCC)    right breast   Cataract    Colitis    Coronary artery calcification 02/20/2020   Diabetes mellitus without complication (HCC)    Type II   Edema 03/05/2021   Edema of both lower extremities    Endometriosis    Family history of adverse reaction to anesthesia    Father - N/V - blood pressure; daughter N/V   Family history of breast cancer    Family history of colon cancer    Family history of kidney cancer    Family history of prostate cancer    Fatty liver    GERD (gastroesophageal reflux disease)    History of hiatal hernia    History of kidney stones    History of ulcerative colitis    Hypertension    Insomnia    Joint pain    Kidney problem    Lower extremity edema 03/05/2021   Lumbar disc disease    L4 L5   Neuropathy    Osteoarthritis    Other hyperlipidemia    Palpitations    Personal history of radiation therapy    Pneumonia    1983, 2003   Pure hypercholesterolemia 02/20/2020   PVCs (premature ventricular contractions)    benign   Umbilical hernia    Vitamin D  deficiency    Wears glasses    Past Surgical History:  Procedure Laterality Date   APPENDECTOMY     BREAST BIOPSY  08-13-2009  DR TSUEI   EXCISION LEFT NIPPLE DUCT   BREAST EXCISIONAL BIOPSY Left    x2   BREAST EXCISIONAL BIOPSY Right    BREAST LUMPECTOMY Right 11/16/2018   invasive ductal   CHOLECYSTECTOMY  1992   COLON RESECTION  1986   ULCERATIVE COLITIS   COLON SURGERY     ESOPHAGEAL MANOMETRY N/A 06/11/2013   Procedure: ESOPHAGEAL MANOMETRY (EM);  Surgeon: Oliva FORBES Boots, MD;  Location: [facility];  Service: Endoscopy;  Laterality: N/A;   ESOPHAGOGASTRODUODENOSCOPY  (EGD) WITH PROPOFOL  N/A 05/31/2016   Procedure: ESOPHAGOGASTRODUODENOSCOPY (EGD) WITH PROPOFOL ;  Surgeon: Oliva Boots, MD;  Location: [facility] ENDOSCOPY;  Service: Endoscopy;  Laterality: N/A;   EXCISION RIGHT BREAST MASS   10-20-2005  DR MAUDE YOUNG   EYE SURGERY Bilateral 2023   cataract   FRACTURE SURGERY     left arm   kidney stone removed  12/2009   KNEE ARTHROSCOPY Left    MCL   LEFT THUMB CARPOMETACARPAL JOINT SUSPENSIONPLASTY  04-06-2006  DR SISSY   LEFT WRIST ARTHROSCOPY W/ DEBRIDEMENT AND REMOVAL CYST  03-26-2004  DR SISSY   MYOMECTOMY     RADIOACTIVE SEED GUIDED PARTIAL MASTECTOMY WITH AXILLARY SENTINEL LYMPH NODE BIOPSY Right 11/16/2018   Procedure: RIGHT BREAST RADIOACTIVE SEED GUIDED PARTIAL MASTECTOMY WITH  SENTINEL  NODE BIOPSY;  Surgeon: Vernetta Berg, MD;  Location: [facility] OR;  Service: General;  Laterality: Right;   RIGHT COLECTOMY     RIGHT URETEROSCOPIC STONE EXTRACTION  12-11-2009  DR NORLEEN [facility]   TENDON RELEASE     TONSILLECTOMY     TOTAL HIP ARTHROPLASTY Right 11/26/2016   Procedure: RIGHT TOTAL HIP ARTHROPLASTY ANTERIOR APPROACH;  Surgeon: Lonni CINDERELLA Poli, MD;  Location: [facility] ORS;  Service: Orthopedics;  Laterality: Right;   TOTAL HIP ARTHROPLASTY Left 12/06/2023   Procedure: LEFT TOTAL HIP ARTHROPLASTY ANTERIOR APPROACH;  Surgeon: Poli Lonni CINDERELLA, MD;  Location: [facility] OR;  Service: Orthopedics;  Laterality: Left;   TOTAL KNEE ARTHROPLASTY Left 12/02/2017   Procedure: LEFT TOTAL KNEE ARTHROPLASTY;  Surgeon: Poli Lonni CINDERELLA, MD;  Location: [facility];  Service: Orthopedics;  Laterality: Left;   TOTAL KNEE ARTHROPLASTY Right 08/03/2022   Procedure: RIGHT TOTAL KNEE ARTHROPLASTY;  Surgeon: Poli Lonni CINDERELLA, MD;  Location: [facility] OR;  Service: Orthopedics;  Laterality: Right;   WRIST SURGERY     both   Patient Active Problem List   Diagnosis Date Noted   OSA on CPAP 10/04/2024   Vaginal atrophy 06/29/2024   History of iron deficiency 05/24/2024    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   Falls 05/01/2024   Iron deficiency 05/01/2024   Overactive bladder 02/22/2024   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/2023   SUI (stress urinary incontinence, female) 11/22/2023   RLS (restless legs syndrome) 09/22/2023   Other hyperlipidemia 06/08/2023   Unilateral primary osteoarthritis, left hip 05/11/2023   Depression 03/16/2023   Allergic rhinitis 12/29/2022   Seasonal affective disorder 12/01/2022   BMI 32.0-32.9,adult 12/01/2022   Obesity, Beginning BMI 37.59 12/01/2022   OA (osteoarthritis) of knee 08/03/2022   Status post total right knee replacement 08/03/2022   Stress 07/19/2022   Other constipation 07/19/2022   Atypical chest pain 06/25/2021   Class 2 severe obesity with serious comorbidity and body mass index (BMI) of 37.0 to 37.9 in adult 06/03/2021   Vitamin D  deficiency 06/03/2021   Other fatigue 04/22/2021   SOB (shortness of breath) on exertion 04/22/2021   Lower extremity edema 03/05/2021   Coronary artery calcification 02/20/2020   Pure hypercholesterolemia 02/20/2020   Unilateral primary osteoarthritis, right knee 01/03/2019   Genetic testing 12/07/2018   Family history of breast cancer    Family history of prostate cancer    Family history of colon cancer    Family history of kidney cancer    Malignant neoplasm of upper-outer quadrant of right breast in female, 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/09/2018   Diabetic neuropathy, type II diabetes mellitus (HCC) 11/09/2018   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/02/2017   Trochanteric bursitis, right hip 05/10/2017   Status post total replacement of right hip 11/26/2016   DDD (degenerative disc disease), 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   Low back pain 04/23/2015   Hiatal hernia 04/13/2013   Diabetes mellitus (HCC) 01/26/2013   Umbilical hernia 07/24/2012   Essential hypertension     PCP: Carlin Dale Gull, MD   REFERRING PROVIDER: Lonell Sprang, DO  REFERRING  DIAG: M76.11 (ICD-10-CM) - Psoas tendinitis of right side M25.551,G89.29,Z96.641 (ICD-10-CM) - Chronic hip pain after total replacement of right hip joint R26.89 (ICD-10-CM) - Abnormality of gait due to impairment of balance M25.551,M25.552 (ICD-10-CM) - Bilateral hip pain  THERAPY DIAG:  No diagnosis found.  Rationale for Evaluation and Treatment: Rehabilitation  ONSET DATE: following HHPT for TKA 3 years ago   SUBJECTIVE:   SUBJECTIVE STATEMENT: *** Patient reporting improvement in pain in Rt hip overall, though did experience a trip and fall over the weekend and would like to focus on continued care with balance.   PERTINENT HISTORY: Patient has been experiencing Rt hip pain (mainly iliopsoas) and balance issues. Patient has received injections from Dr. Sprang for iliopsoas pain that assisted with pain.  Patient has undergone bilat TKA and bilat THA with no other injuries or surgeries to surrounding areas. Patient intermittently experiencing low back pain. Patient has experienced 5 falls in past year with 2 major falls leading to injuries; has since been cleared by medical team.  May need to check BP prior to sessions   See PMH or personal factors for in depth comorbidities    PAIN:  NPRS scale: not rated this session  Pain location: Rt hip  Pain description: sharp when moving, dull when sitting  Aggravating factors: stairs, into car, bed mobility/transfers  Relieving factors: heating pad, rest    PRECAUTIONS: Fall  RED FLAGS: Bowel or bladder incontinence: No and Cauda equina syndrome: No   WEIGHT BEARING RESTRICTIONS: No  FALLS:  Has patient fallen in last 6 months? Yes. Number of falls 5; 2 caused injuries (one from a potential medication issue that has since been changed, had on clogs and landed flat with arms by side hitting face, bruised ribs, etc.)  LIVING ENVIRONMENT: Lives with: lives alone Lives in: House/apartment Stairs: Yes: Internal: 1 steps; on right  going up Has following equipment at home: Single point cane, Walker - 2 wheeled, and Grab bars  OCCUPATION: retired   PLOF: Independent  PATIENT GOALS: be more independent, reduce pain to be more active, get down on the floor and back   NEXT MD VISIT: not scheduled with Dr. Burnetta, follow up with Dr. Vernetta Sep 05, 2024  OBJECTIVE:  Note: Objective measures were completed at Evaluation unless otherwise noted.  DIAGNOSTIC FINDINGS:  An AP pelvis shows bilateral total hip arthroplasties with no complicating  features.   PATIENT SURVEYS:  PSFS: THE PATIENT SPECIFIC FUNCTIONAL SCALE  Place score of 0-10 (0 = unable to perform activity and 10 = able to perform activity at the same level as before injury or problem)  Activity Date: 08/21/2024    Get in and out of car   1    2. Get in and out of bed   1    3. Go up and down steps   3    4.      Total Score 1.66      Total Score = Sum of activity scores/number of activities  Minimally Detectable Change: 3 points (for single activity); 2 points (for average score)  [facility] (nd). The Patient Specific Functional Scale . Retrieved from [email or web address]   COGNITION: 08/21/2024 Overall cognitive status: Within functional limits for tasks assessed     SENSATION: 08/21/2024 Not tested  EDEMA:  08/21/2024 Not tested   MUSCLE LENGTH: 08/21/2024 Not formally assessed secondary to pain   POSTURE:  08/21/2024 rounded shoulders, forward head, and increased thoracic kyphosis  PALPATION: 08/21/2024 TTP at Rt iliopsoas, Rt lateral glutes, Rt posterior glutes   LOWER EXTREMITY ROM:  ROM Right Eval 08/21/2024 Left Eval 08/21/2024  Hip flexion AROM: 63deg PROM: ~95deg AROM: 110deg with back pain noted   Hip extension    Hip abduction    Hip adduction    Hip internal rotation    Hip external rotation    Knee flexion    Knee extension    Ankle  dorsiflexion    Ankle plantarflexion    Ankle inversion    Ankle eversion     (Blank rows = not tested)  LOWER EXTREMITY MMT:  MMT Right Eval 08/21/2024 Left Eval 08/21/2024  Hip flexion (seated) 2+/5 3+/5  Hip extension (prone) 2+/5 3-/5  Hip abduction (sidelying) 2+/5  3+/5  Hip adduction    Hip internal rotation    Hip external rotation    Knee flexion(seated) 4+/5 5/5  Knee extension(seated) 3-/5 5/5  Ankle dorsiflexion(seated) 5/5 5/5  Ankle plantarflexion    Ankle inversion    Ankle eversion     (Blank rows = not tested)  LOWER EXTREMITY SPECIAL TESTS:  08/21/2024 No testing.    FUNCTIONAL TESTS:  08/21/2024 5 times sit to stand: 18.53s  GAIT: 08/21/2024 Distance walked: not formally assessed  Assistive device utilized: None Level of assistance: supervision  Comments: antalgic gait pattern    [facility] PT Assessment - 09/05/24 0001       Functional Gait  Assessment   Gait Level Surface Walks 20 ft, slow speed, abnormal gait pattern, evidence for imbalance or deviates 10-15 in outside of the 12 in walkway width. Requires more than 7 sec to ambulate 20 ft.    Change in Gait Speed Able to change speed, demonstrates mild gait deviations, deviates 6-10 in outside of the 12 in walkway width, or no gait deviations, unable to achieve a major change in velocity, or uses a change in velocity, or uses an assistive device.    Gait with Horizontal Head Turns Performs head turns with moderate changes in gait velocity, slows down, deviates 10-15 in outside 12 in walkway width but recovers, can continue to walk.    Gait with Vertical Head Turns Performs task with slight change in gait velocity (eg, minor disruption to smooth gait path), deviates 6 - 10 in outside 12 in walkway width or uses assistive device    Gait and Pivot Turn Pivot turns safely in greater than 3 sec and stops with no loss of balance, or pivot turns safely within 3 sec and stops with mild imbalance, requires  small steps to catch balance.    Step Over Obstacle Is able to step over one shoe box (4.5 in total height) but must slow down and adjust steps to clear box safely. May require verbal cueing.    Gait with Narrow Base of Support Ambulates less than 4 steps heel to toe or cannot perform without assistance.    Gait with Eyes Closed Cannot walk 20 ft without assistance, severe gait deviations or imbalance, deviates greater than 15 in outside 12 in walkway width or will not attempt task.    Ambulating Backwards Walks 20 ft, slow speed, abnormal gait pattern, evidence for imbalance, deviates 10-15 in outside 12 in walkway width.    Steps Alternating feet, must use rail.    Total Score 12    FGA comment: Max score 30  Interpretation: 25-28 = low risk fall   19-24 = medium risk fall  < 19 = high risk fall                             TREATMENT        DATE: 10/10/2024 ***   TREATMENT        DATE: 10/08/2024 TherEx:  Recumbent bike level 4 for 8 minutes  Thomas/hip flexor stretch on mat table 3x45s with PT applying STM to proximal quad/hip flexor on Rt side  Patient noting discomfort/pain in lateral thigh during last round   Neuro Re-Ed:  Reciprocal step taps to 6 steps 2x10 with intermittent UE use on parallel bars  No pain noted with activity  Rocker board fwd/back to focus on control and ankle strategy 2x20 taps  Rocker board lateral  to focus on control and ankle/hip 1x20 taps  PT discussed balance strategies and how they are challenged with each activity above    TREATMENT        DATE: 10/01/2024 TherEx:  Recumbent bike level 3 for 8 minutes  Thomas/hip flexor stretch on mat table 5x45s with PT applying STM to proximal quad/hip flexor on Rt side  PT discussed POC and activities to perform over the next few weeks   Neuro Re-Ed:  [employer], fwd/back 1x12 each direction, lateral 2x12 each side  Lateral walks on foam pad with intermittent UE use on // bars ; decreased speed this date  with improved performance compared to last session    TREATMENT        DATE: 09/20/2024 TherEx:  Recumbent bike level 3 for 8 minutes  Step taps to 4 step with bilat UE support for stability to work on initiating knee and hip flexion 2x20 taps  Increased challenge to 6 step 1x20 with increased pain endorsed after 6 reps   Neuro Re-Ed: Narrow stance with horizontal head turns 1x20 turns  Semi tandem stance with horizontal head turns 2x20 turns bilaterally ; intermittent increased lateral sway and only one instance of lateral LOB requiring UE use on // bars  Lateral walks on foam pad with bilat UE use 1x4 down and back  PT discussed how to progress with stability and balance, how strength plays into balance     PATIENT EDUCATION:  Education details: HEP, POC, interventions, strength and flexibility  Person educated: Patient Education method: Explanation, Demonstration, Tactile cues, Verbal cues, and Handouts Education comprehension: verbalized understanding, returned demonstration, verbal cues required, and tactile cues required  HOME EXERCISE PROGRAM: Access Code: [email or web address] URL: [email or web address] Date: 08/21/2024 Prepared by: Susannah Daring  Exercises - Standing Hip Abduction with Counter Support  - 1 x daily - 7 x weekly - 2 sets - 5-10 reps - Standing Hip Extension with Counter Support  - 1 x daily - 7 x weekly - 2 sets - 5-10 reps - Mini Squat with Counter Support  - 1 x daily - 7 x weekly - 2 sets - 5-10 reps - Modified Thomas Stretch  - 1 x daily - 7 x weekly - 3 sets - 15-30s hold - Standing Hip Flexor Stretch  - 1 x daily - 7 x weekly - 3 sets - 15-30s hold  ASSESSMENT:  CLINICAL IMPRESSION: ***  Patient arrived to session noting improved Rt hip symptoms, though continues to have difficulty with balance issues. Patient tolerated all activities this date and will benefit from focus on balance and dual tasking with ambulation. Patient will continue to  benefit from skilled PT.  OBJECTIVE IMPAIRMENTS: Abnormal gait, decreased activity tolerance, decreased balance, decreased coordination, decreased endurance, decreased mobility, difficulty walking, decreased ROM, decreased strength, impaired flexibility, improper body mechanics, postural dysfunction, obesity, and pain.   ACTIVITY LIMITATIONS: bending, sitting, standing, squatting, sleeping, stairs, transfers, and bed mobility  PARTICIPATION LIMITATIONS: interpersonal relationship, community activity, and church  PERSONAL FACTORS: Past/current experiences, Time since onset of injury/illness/exacerbation, and 3+ comorbidities: anxiety, arthritis, cancer history, DM, GERD, HLD, HTN, kidney disease, ulcerative colitis, restless leg syndrome, lumbar disc disease  are also affecting patient's functional outcome.   REHAB POTENTIAL: Good  CLINICAL DECISION MAKING: Evolving/moderate complexity  EVALUATION COMPLEXITY: Moderate   GOALS: Goals reviewed with patient? Yes  SHORT TERM GOALS: Target date: 09/11/2024 Patient will show compliance with initial HEP.  Baseline: Goal status: GOAL MET, 09/13/2024  2.  Patient  will report pain levels no greater than 7/10 to show overall improved quality of life. Baseline:  Goal status: goal ongoing, 09/13/2024   LONG TERM GOALS: Target date: 10/16/2024  Patient will be independent with final HEP in order to maintain and progress upon functional gains made within PT. Baseline:  Goal status: INITIAL  2.  Patient will report pain levels no greater than 4/10 to show overall improved quality of life. Baseline:  Goal status: INITIAL  3.  Patient will increase PSFS to at least 3.66 in order to show a significant improvement in subjective disability rating. Baseline:  Goal status: INITIAL  4.  Patient will increase bilat hip abduction strength to at least 4-/5 in order to improve biomechanics with functional mobility. Baseline:  Goal status:  INITIAL  5.  Patient will increase bilat hip extension strength to at least 3+/5 in order to improve biomechanics with functional mobility. Baseline:  Goal status: INITIAL  6.  Patient will increase Rt hip flexion AROM to at least 90deg in order to improve functional mobility. Baseline:  Goal status: INITIAL  7.  Patient will increase 5xSTS to at least 16.23s in order to decrease chances of falling. Baseline:  Goal status: INITIAL   PLAN:  PT FREQUENCY: 1-2x/week  PT DURATION: 8 weeks  PLANNED INTERVENTIONS: [id number]- PT Re-evaluation, 97750- Physical Performance Testing, 97110-Therapeutic exercises, 97530- Therapeutic activity, [id number]- Neuromuscular re-education, 97535- Self Care, [id number]- Manual therapy, [id number]- Gait training, [phone]- Orthotic Initial, [phone]- Orthotic/Prosthetic subsequent, [phone]- Canalith repositioning, [phone]- Aquatic Therapy, [phone]- Electrical stimulation (unattended), [phone]- Electrical stimulation (manual), [id number]- Vasopneumatic device, [id number]- Ultrasound, [id number]- Traction (mechanical), [id number]- Ionotophoresis 4mg /ml Dexamethasone , [id number] (1-2 muscles), [id number] (3+ muscles)- Dry Needling, Patient/Family education, Balance training, Stair training, Taping, Joint mobilization, Joint manipulation, Spinal manipulation, Spinal mobilization, Scar mobilization, Compression bandaging, Vestibular training, DME instructions, Cryotherapy, and Moist heat  PLAN FOR NEXT SESSION:  *** RECERT,   hip flexor stretching, general hip/LE strengthening, balance (static and functional), eccentric hip flexion?   Susannah Daring, PT, DPT 10/09/24 9:40 AM

## 2024-10-10 ENCOUNTER — Ambulatory Visit

## 2024-10-10 ENCOUNTER — Encounter

## 2024-10-10 DIAGNOSIS — R2681 Unsteadiness on feet: Secondary | ICD-10-CM

## 2024-10-10 DIAGNOSIS — M5459 Other low back pain: Secondary | ICD-10-CM | POA: Diagnosis not present

## 2024-10-10 DIAGNOSIS — M25551 Pain in right hip: Secondary | ICD-10-CM | POA: Diagnosis not present

## 2024-10-10 DIAGNOSIS — R2689 Other abnormalities of gait and mobility: Secondary | ICD-10-CM

## 2024-10-10 DIAGNOSIS — R293 Abnormal posture: Secondary | ICD-10-CM | POA: Diagnosis not present

## 2024-10-10 DIAGNOSIS — M6281 Muscle weakness (generalized): Secondary | ICD-10-CM | POA: Diagnosis not present

## 2024-10-11 ENCOUNTER — Telehealth: Admitting: Obstetrics and Gynecology

## 2024-10-11 DIAGNOSIS — N3281 Overactive bladder: Secondary | ICD-10-CM | POA: Diagnosis not present

## 2024-10-11 NOTE — Progress Notes (Signed)
 [facility] Return Visit- video visit Patient location- [facility] Provider location- [place] office Pt was the only person on the call and consented to video visit.   SUBJECTIVE  History of Present Illness: Lauren [name] is a 78 y.o. female seen in follow-up for mixed incontinence. Last visit she started Gemtesa .  Gemtesa  is working better than other medications. Has decreased urgency but still has accidents from time to time. Not getting up at night to urinate. However, the medication costs her $1800 for a 3 month supply so she wants to consider other options. Previously tried Myrbetriq  and Trospium .   She was diagnosed with UTI with her PCP around thanksgiving. She has not been using the estradiol  cream.   Past Medical History: Patient  has a past medical history of Anxiety, Arthritis, Atypical chest pain (06/25/2021), Back pain, Cancer (HCC), Cataract, Colitis, Coronary artery calcification (02/20/2020), Diabetes mellitus without complication (HCC), Edema (03/05/2021), Edema of both lower extremities, Endometriosis, Family history of adverse reaction to anesthesia, Family history of breast cancer, Family history of colon cancer, Family history of kidney cancer, Family history of prostate cancer, Fatty liver, GERD (gastroesophageal reflux disease), History of hiatal hernia, History of kidney stones, History of ulcerative colitis, Hypertension, Insomnia, Joint pain, Kidney problem, Lower extremity edema (03/05/2021), Lumbar disc disease, Neuropathy, Osteoarthritis, Other hyperlipidemia, Palpitations, Personal history of radiation therapy, Pneumonia, Pure hypercholesterolemia (02/20/2020), PVCs (premature ventricular contractions), Umbilical hernia, Vitamin D  deficiency, and Wears glasses.   Past Surgical History: She  has a past surgical history that includes Colon surgery; Wrist surgery; Myomectomy; kidney stone removed (12/2009); Right colectomy; Tendon release; RIGHT URETEROSCOPIC STONE  EXTRACTION (12-11-2009  DR NORLEEN SELTZER); LEFT THUMB CARPOMETACARPAL JOINT SUSPENSIONPLASTY (04-06-2006  DR SISSY); EXCISION RIGHT BREAST MASS  (10-20-2005  DR MAUDE SALT); LEFT WRIST ARTHROSCOPY W/ DEBRIDEMENT AND REMOVAL CYST (03-26-2004  DR SISSY); Cholecystectomy (1992); Breast biopsy (08-13-2009  DR TSUEI); Colon resection (1986); Esophageal manometry (N/A, 06/11/2013); Knee arthroscopy (Left); Esophagogastroduodenoscopy (egd) with propofol  (N/A, 05/31/2016); Total hip arthroplasty (Right, 11/26/2016); Total knee arthroplasty (Left, 12/02/2017); Breast excisional biopsy (Left); Breast excisional biopsy (Right); Tonsillectomy; Appendectomy; Fracture surgery; Radioactive seed guided mastectomy with axillary sentinel lymph node biopsy (Right, 11/16/2018); Breast lumpectomy (Right, 11/16/2018); Eye surgery (Bilateral, 2023); Total knee arthroplasty (Right, 08/03/2022); and Total hip arthroplasty (Left, 12/06/2023).   Medications: She has a current medication list which includes the following prescription(s): aspirin  ec, b-complex with vitamin c, escitalopram , estradiol , fiber, gabapentin , hydrocodone -acetaminophen , onetouch delica plus lancet33g, magnesium , methocarbamol , onetouch verio, pantoprazole , phenylephrine , rosuvastatin , tirzepatide , and vibegron .   Allergies: Patient is allergic to ciprofloxacin hcl, percocet [oxycodone -acetaminophen ], penicillins, sulfa antibiotics, tape, tetanus toxoid-containing vaccines, and jardiance [empagliflozin].   Social History: Patient  reports that she quit smoking about 42 years ago. Her smoking use included cigarettes. She started smoking about 52 years ago. She has a 10 pack-year smoking history. She quit smokeless tobacco use about 42 years ago. She reports that she does not drink alcohol and does not use drugs.     OBJECTIVE     Physical Exam: There were no vitals filed for this visit.   Gen: No apparent distress, A&O x 3.     ASSESSMENT AND PLAN     Lauren [name] is a 78 y.o. with:  1. Overactive bladder     - For refractory OAB we reviewed the procedure for intravesical Botox injection with cystoscopy in the office and reviewed the risks, benefits and alternatives of treatment including but not limited to infection, need for self-catheterization and  need for repeat therapy.  We discussed that there is a 5-15% chance of needing to catheterize with Botox and that this usually resolves in a few months; however can persist for longer periods of time.  Typically Botox injections would need to be repeated every 3-12 months since this is not a permanent therapy.  - We discussed the role of sacral neuromodulation and how it works. It requires a test phase, and documentation of bladder function via diary. After a successful test period, a permanent wire and generator are placed in the OR. The battery lasts 5 years on average and would need to be replaced surgically.  The goal of this therapy is at least a 50% improvement in symptoms. It is NOT realistic to expect a 100% cure.  We reviewed the fact that about 30% of patients fail the test phase and are not candidates for permanent generator placement.   - We also discussed the role of percutaneous tibial nerve stimulation and how it works.  She understands it requires 12 weekly visits for temporary neuromodulation of the sacral nerve roots via the tibial nerve and that she may then require continued tapered treatment.   - She is interested in PTNS. Will have her stop Gemtesa  before first session and fill out baseline bladder diary at that time.  - Can also reassess SUI symptoms after starting PTNS.    Lauren LOISE Caper, MD

## 2024-10-31 ENCOUNTER — Encounter

## 2024-11-02 ENCOUNTER — Encounter

## 2024-11-02 ENCOUNTER — Telehealth: Payer: Self-pay | Admitting: Hematology and Oncology

## 2024-11-02 NOTE — Telephone Encounter (Signed)
 I spoke with patient as she called in to reschedule appointment with MD from 11/06/2024 to 12/13/2024.

## 2024-11-05 ENCOUNTER — Ambulatory Visit

## 2024-11-05 ENCOUNTER — Encounter: Payer: Self-pay | Admitting: Obstetrics and Gynecology

## 2024-11-05 DIAGNOSIS — M5459 Other low back pain: Secondary | ICD-10-CM

## 2024-11-05 DIAGNOSIS — R293 Abnormal posture: Secondary | ICD-10-CM | POA: Diagnosis not present

## 2024-11-05 DIAGNOSIS — M25551 Pain in right hip: Secondary | ICD-10-CM | POA: Diagnosis not present

## 2024-11-05 DIAGNOSIS — R2681 Unsteadiness on feet: Secondary | ICD-10-CM

## 2024-11-05 DIAGNOSIS — R2689 Other abnormalities of gait and mobility: Secondary | ICD-10-CM

## 2024-11-05 DIAGNOSIS — M6281 Muscle weakness (generalized): Secondary | ICD-10-CM | POA: Diagnosis not present

## 2024-11-05 NOTE — Therapy (Signed)
 " OUTPATIENT PHYSICAL THERAPY TREATMENT    Patient Name: Lauren [name] MRN: [id number] DOB:[birth year], 79 y.o., female Today's Date: 11/05/2024  END OF SESSION:  PT End of Session - 11/05/24 1026     Visit Number 11    Number of Visits 22    Date for Recertification  11/21/24    Authorization Type [employer] $15 COPAY    Progress Note Due on Visit 10    PT Start Time 1021    PT Stop Time 1103    PT Time Calculation (min) 42 min    Activity Tolerance Patient tolerated treatment well;Patient limited by pain    Behavior During Therapy [facility] for tasks assessed/performed            Past Medical History:  Diagnosis Date   Anxiety    Arthritis    Back, Hip- right   Atypical chest pain 06/25/2021   Back pain    Cancer (HCC)    right breast   Cataract    Colitis    Coronary artery calcification 02/20/2020   Diabetes mellitus without complication (HCC)    Type II   Edema 03/05/2021   Edema of both lower extremities    Endometriosis    Family history of adverse reaction to anesthesia    Father - N/V - blood pressure; daughter N/V   Family history of breast cancer    Family history of colon cancer    Family history of kidney cancer    Family history of prostate cancer    Fatty liver    GERD (gastroesophageal reflux disease)    History of hiatal hernia    History of kidney stones    History of ulcerative colitis    Hypertension    Insomnia    Joint pain    Kidney problem    Lower extremity edema 03/05/2021   Lumbar disc disease    L4 L5   Neuropathy    Osteoarthritis    Other hyperlipidemia    Palpitations    Personal history of radiation therapy    Pneumonia    1983, 2003   Pure hypercholesterolemia 02/20/2020   PVCs (premature ventricular contractions)    benign   Umbilical hernia    Vitamin D  deficiency    Wears glasses    Past Surgical History:  Procedure Laterality Date   APPENDECTOMY     BREAST BIOPSY  08-13-2009  DR TSUEI   EXCISION LEFT  NIPPLE DUCT   BREAST EXCISIONAL BIOPSY Left    x2   BREAST EXCISIONAL BIOPSY Right    BREAST LUMPECTOMY Right 11/16/2018   invasive ductal   CHOLECYSTECTOMY  1992   COLON RESECTION  1986   ULCERATIVE COLITIS   COLON SURGERY     ESOPHAGEAL MANOMETRY N/A 06/11/2013   Procedure: ESOPHAGEAL MANOMETRY (EM);  Surgeon: Oliva FORBES Boots, MD;  Location: [facility] ENDOSCOPY;  Service: Endoscopy;  Laterality: N/A;   ESOPHAGOGASTRODUODENOSCOPY (EGD) WITH PROPOFOL  N/A 05/31/2016   Procedure: ESOPHAGOGASTRODUODENOSCOPY (EGD) WITH PROPOFOL ;  Surgeon: Oliva Boots, MD;  Location: [facility] ENDOSCOPY;  Service: Endoscopy;  Laterality: N/A;   EXCISION RIGHT BREAST MASS   10-20-2005  DR MAUDE YOUNG   EYE SURGERY Bilateral 2023   cataract   FRACTURE SURGERY     left arm   kidney stone removed  12/2009   KNEE ARTHROSCOPY Left    MCL   LEFT THUMB CARPOMETACARPAL JOINT SUSPENSIONPLASTY  04-06-2006  DR SISSY   LEFT WRIST ARTHROSCOPY W/ DEBRIDEMENT AND REMOVAL CYST  03-26-2004  DR SISSY   MYOMECTOMY     RADIOACTIVE SEED GUIDED PARTIAL MASTECTOMY WITH AXILLARY SENTINEL LYMPH NODE BIOPSY Right 11/16/2018   Procedure: RIGHT BREAST RADIOACTIVE SEED GUIDED PARTIAL MASTECTOMY WITH  SENTINEL  NODE BIOPSY;  Surgeon: Vernetta Berg, MD;  Location: [facility] OR;  Service: General;  Laterality: Right;   RIGHT COLECTOMY     RIGHT URETEROSCOPIC STONE EXTRACTION  12-11-2009  DR NORLEEN [facility]   TENDON RELEASE     TONSILLECTOMY     TOTAL HIP ARTHROPLASTY Right 11/26/2016   Procedure: RIGHT TOTAL HIP ARTHROPLASTY ANTERIOR APPROACH;  Surgeon: Lonni CINDERELLA Vernetta, MD;  Location: [facility] ORS;  Service: Orthopedics;  Laterality: Right;   TOTAL HIP ARTHROPLASTY Left 12/06/2023   Procedure: LEFT TOTAL HIP ARTHROPLASTY ANTERIOR APPROACH;  Surgeon: Vernetta Lonni CINDERELLA, MD;  Location: [facility] OR;  Service: Orthopedics;  Laterality: Left;   TOTAL KNEE ARTHROPLASTY Left 12/02/2017   Procedure: LEFT TOTAL KNEE ARTHROPLASTY;  Surgeon: Vernetta Lonni CINDERELLA,  MD;  Location: [facility] ORS;  Service: Orthopedics;  Laterality: Left;   TOTAL KNEE ARTHROPLASTY Right 08/03/2022   Procedure: RIGHT TOTAL KNEE ARTHROPLASTY;  Surgeon: Vernetta Lonni CINDERELLA, MD;  Location: [facility] OR;  Service: Orthopedics;  Laterality: Right;   WRIST SURGERY     both   Patient Active Problem List   Diagnosis Date Noted   OSA on CPAP 10/04/2024   Vaginal atrophy 06/29/2024   History of iron deficiency 05/24/2024   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   Falls 05/01/2024   Iron deficiency 05/01/2024   Overactive bladder 02/22/2024   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/2023   SUI (stress urinary incontinence, female) 11/22/2023   RLS (restless legs syndrome) 09/22/2023   Other hyperlipidemia 06/08/2023   Unilateral primary osteoarthritis, left hip 05/11/2023   Depression 03/16/2023   Allergic rhinitis 12/29/2022   Seasonal affective disorder 12/01/2022   BMI 32.0-32.9,adult 12/01/2022   Obesity, Beginning BMI 37.59 12/01/2022   OA (osteoarthritis) of knee 08/03/2022   Status post total right knee replacement 08/03/2022   Stress 07/19/2022   Other constipation 07/19/2022   Atypical chest pain 06/25/2021   Class 2 severe obesity with serious comorbidity and body mass index (BMI) of 37.0 to 37.9 in adult 06/03/2021   Vitamin D  deficiency 06/03/2021   Other fatigue 04/22/2021   SOB (shortness of breath) on exertion 04/22/2021   Lower extremity edema 03/05/2021   Coronary artery calcification 02/20/2020   Pure hypercholesterolemia 02/20/2020   Unilateral primary osteoarthritis, right knee 01/03/2019   Genetic testing 12/07/2018   Family history of breast cancer    Family history of prostate cancer    Family history of colon cancer    Family history of kidney cancer    Malignant neoplasm of upper-outer quadrant of right breast in female, 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/09/2018   Diabetic neuropathy, type II diabetes mellitus (HCC) 11/09/2018   Status post  total left knee replacement 12/02/2017   Trochanteric bursitis, right hip 05/10/2017   Status post total replacement of right hip 11/26/2016   DDD (degenerative disc disease), 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   Low back pain 04/23/2015   Hiatal hernia 04/13/2013   Diabetes mellitus (HCC) 01/26/2013   Umbilical hernia 07/24/2012   Essential hypertension     PCP: Carlin Dale Gull, MD   REFERRING PROVIDER: Lonell Sprang, DO  REFERRING DIAG: M76.11 (ICD-10-CM) - Psoas tendinitis of right side M25.551,G89.29,Z96.641 (ICD-10-CM) - Chronic hip pain after total replacement of right hip joint R26.89 (ICD-10-CM) - Abnormality of gait due  to impairment of balance M25.551,M25.552 (ICD-10-CM) - Bilateral hip pain  THERAPY DIAG:  Pain in right hip  Other low back pain  Muscle weakness (generalized)  Abnormal posture  Unsteadiness on feet  Other abnormalities of gait and mobility  Rationale for Evaluation and Treatment: Rehabilitation  ONSET DATE: following HHPT for TKA 3 years ago   SUBJECTIVE:   SUBJECTIVE STATEMENT: Patient reports that she is feeling a little sore but continuing to feel better overall.   PERTINENT HISTORY: Patient has been experiencing Rt hip pain (mainly iliopsoas) and balance issues. Patient has received injections from Dr. Burnetta for iliopsoas pain that assisted with pain. Patient has undergone bilat TKA and bilat THA with no other injuries or surgeries to surrounding areas. Patient intermittently experiencing low back pain. Patient has experienced 5 falls in past year with 2 major falls leading to injuries; has since been cleared by medical team.  May need to check BP prior to sessions   See PMH or personal factors for in depth comorbidities    PAIN:  NPRS scale: 3-4/10 Pain location: Rt hip  Pain description: sharp when moving, dull when sitting  Aggravating factors: stairs, into car, bed mobility/transfers  Relieving factors: heating pad, rest    PRECAUTIONS:  Fall  RED FLAGS: Bowel or bladder incontinence: No and Cauda equina syndrome: No   WEIGHT BEARING RESTRICTIONS: No  FALLS:  Has patient fallen in last 6 months? Yes. Number of falls 5; 2 caused injuries (one from a potential medication issue that has since been changed, had on clogs and landed flat with arms by side hitting face, bruised ribs, etc.)  LIVING ENVIRONMENT: Lives with: lives alone Lives in: House/apartment Stairs: Yes: Internal: 1 steps; on right going up Has following equipment at home: Single point cane, Walker - 2 wheeled, and Grab bars  OCCUPATION: retired   PLOF: Independent  PATIENT GOALS: be more independent, reduce pain to be more active, get down on the floor and back   NEXT MD VISIT: not scheduled with Dr. Burnetta, follow up with Dr. Vernetta Sep 05, 2024  OBJECTIVE:  Note: Objective measures were completed at Evaluation unless otherwise noted.  DIAGNOSTIC FINDINGS:  An AP pelvis shows bilateral total hip arthroplasties with no complicating  features.   PATIENT SURVEYS:  PSFS: THE PATIENT SPECIFIC FUNCTIONAL SCALE  Place score of 0-10 (0 = unable to perform activity and 10 = able to perform activity at the same level as before injury or problem)  Activity Date: 08/21/2024    Get in and out of car   1    2. Get in and out of bed   1    3. Go up and down steps   3    4.      Total Score 1.66      Total Score = Sum of activity scores/number of activities  Minimally Detectable Change: 3 points (for single activity); 2 points (for average score)  [facility] (nd). The Patient Specific Functional Scale . Retrieved from [email or web address]   COGNITION: 08/21/2024 Overall cognitive status: Within functional limits for tasks assessed     SENSATION: 08/21/2024 Not tested  EDEMA:  08/21/2024 Not tested   MUSCLE LENGTH: 08/21/2024 Not formally assessed secondary to pain    POSTURE:  08/21/2024 rounded shoulders, forward head, and increased thoracic kyphosis  PALPATION: 08/21/2024 TTP at Rt iliopsoas, Rt lateral glutes, Rt posterior glutes   LOWER EXTREMITY ROM:  ROM Right Eval 08/21/2024 Left Eval 08/21/2024  Hip flexion AROM: 63deg PROM: ~95deg AROM: 110deg with back pain noted   Hip extension    Hip abduction    Hip adduction    Hip internal rotation    Hip external rotation    Knee flexion    Knee extension    Ankle dorsiflexion    Ankle plantarflexion    Ankle inversion    Ankle eversion     (Blank rows = not tested)  LOWER EXTREMITY MMT:  MMT Right Eval 08/21/2024 Left Eval 08/21/2024  Hip flexion (seated) 2+/5 3+/5  Hip extension (prone) 2+/5 3-/5  Hip abduction (sidelying) 2+/5 3+/5  Hip adduction    Hip internal rotation    Hip external rotation    Knee flexion(seated) 4+/5 5/5  Knee extension(seated) 3-/5 5/5  Ankle dorsiflexion(seated) 5/5 5/5  Ankle plantarflexion    Ankle inversion    Ankle eversion     (Blank rows = not tested)  LOWER EXTREMITY SPECIAL TESTS:  08/21/2024 No testing.    FUNCTIONAL TESTS:  08/21/2024 5 times sit to stand: 18.53s  GAIT: 08/21/2024 Distance walked: not formally assessed  Assistive device utilized: None Level of assistance: supervision  Comments: antalgic gait pattern    [facility] PT Assessment - 09/05/24 0001       Functional Gait  Assessment   Gait Level Surface Walks 20 ft, slow speed, abnormal gait pattern, evidence for imbalance or deviates 10-15 in outside of the 12 in walkway width. Requires more than 7 sec to ambulate 20 ft.    Change in Gait Speed Able to change speed, demonstrates mild gait deviations, deviates 6-10 in outside of the 12 in walkway width, or no gait deviations, unable to achieve a major change in velocity, or uses a change in velocity, or uses an assistive device.    Gait with Horizontal Head Turns Performs head turns with moderate changes in gait  velocity, slows down, deviates 10-15 in outside 12 in walkway width but recovers, can continue to walk.    Gait with Vertical Head Turns Performs task with slight change in gait velocity (eg, minor disruption to smooth gait path), deviates 6 - 10 in outside 12 in walkway width or uses assistive device    Gait and Pivot Turn Pivot turns safely in greater than 3 sec and stops with no loss of balance, or pivot turns safely within 3 sec and stops with mild imbalance, requires small steps to catch balance.    Step Over Obstacle Is able to step over one shoe box (4.5 in total height) but must slow down and adjust steps to clear box safely. May require verbal cueing.    Gait with Narrow Base of Support Ambulates less than 4 steps heel to toe or cannot perform without assistance.    Gait with Eyes Closed Cannot walk 20 ft without assistance, severe gait deviations or imbalance, deviates greater than 15 in outside 12 in walkway width or will not attempt task.    Ambulating Backwards Walks 20 ft, slow speed, abnormal gait pattern, evidence for imbalance, deviates 10-15 in outside 12 in walkway width.    Steps Alternating feet, must use rail.    Total Score 12    FGA comment: Max score 30  Interpretation: 25-28 = low risk fall   19-24 = medium risk fall  < 19 = high risk fall  TREATMENT        DATE: 11/05/2024 TherEx:  Recumbent bike level 3 for 8 minutes   Neuro Re-Ed:  Narrow stance with horizontal and vertical head turns 1x20 each direction Staggered stance with horizontal and vertical head turns with both feet fwd ; 1x16 each direction and each foot fwd Lateral walks on foam pad with transition off/on ; intermittent UE use on // bars and increased difficulty with stepping to Lt  Step taps to 4 step without UE use with improvement in performance with time   TREATMENT        DATE: 10/10/2024 TherEx:  Recumbent bike level 5 for 8 minutes  Thomas/hip flexor stretch on mat  table 3x60s with PT applying STM to proximal quad/hip flexor on Rt side   Neuro Re-Ed: all activities below performed with CGA  Narrow stance with horizontal head turns 1x20 with intermittent finger tip touch to // bars for stability  Staggered stance stance with horizontal head turns 1x20 with each foot fwd ; intermittent finger tip touch to // bars for stability  PT challenging stepping reactions in all direction (fwd, back, Rt, Lt) with patient performing with appropriate stepping reactions and no required UE support on // bars other than for a fwd trial Reciprocal stepping over small hurdles with CGA and HHA 1x2 down and back with intermittent toe catching on hurdle and intermittent lateral LOB    TREATMENT        DATE: 10/08/2024 TherEx:  Recumbent bike level 4 for 8 minutes  Thomas/hip flexor stretch on mat table 3x45s with PT applying STM to proximal quad/hip flexor on Rt side  Patient noting discomfort/pain in lateral thigh during last round   Neuro Re-Ed:  Reciprocal step taps to 6 steps 2x10 with intermittent UE use on parallel bars  No pain noted with activity  Rocker board fwd/back to focus on control and ankle strategy 2x20 taps  Rocker board lateral to focus on control and ankle/hip 1x20 taps  PT discussed balance strategies and how they are challenged with each activity above  PT discussed difficulty levels with different bases of support, how head turns challenge visual system and vestibular system, and why we start with challenging static vs dynamic    TREATMENT        DATE: 10/01/2024 TherEx:  Recumbent bike level 3 for 8 minutes  Thomas/hip flexor stretch on mat table 5x45s with PT applying STM to proximal quad/hip flexor on Rt side  PT discussed POC and activities to perform over the next few weeks   Neuro Re-Ed:  [employer], fwd/back 1x12 each direction, lateral 2x12 each side  Lateral walks on foam pad with intermittent UE use on // bars ; decreased speed this  date with improved performance compared to last session    TREATMENT        DATE: 09/20/2024 TherEx:  Recumbent bike level 3 for 8 minutes  Step taps to 4 step with bilat UE support for stability to work on initiating knee and hip flexion 2x20 taps  Increased challenge to 6 step 1x20 with increased pain endorsed after 6 reps   Neuro Re-Ed: Narrow stance with horizontal head turns 1x20 turns  Semi tandem stance with horizontal head turns 2x20 turns bilaterally ; intermittent increased lateral sway and only one instance of lateral LOB requiring UE use on // bars  Lateral walks on foam pad with bilat UE use 1x4 down and back  PT discussed how to progress with stability and  balance, how strength plays into balance     PATIENT EDUCATION:  Education details: HEP, POC, interventions, strength and flexibility  Person educated: Patient Education method: Explanation, Demonstration, Tactile cues, Verbal cues, and Handouts Education comprehension: verbalized understanding, returned demonstration, verbal cues required, and tactile cues required  HOME EXERCISE PROGRAM: Access Code: [email or web address] URL: [email or web address] Date: 08/21/2024 Prepared by: Susannah Daring  Exercises - Standing Hip Abduction with Counter Support  - 1 x daily - 7 x weekly - 2 sets - 5-10 reps - Standing Hip Extension with Counter Support  - 1 x daily - 7 x weekly - 2 sets - 5-10 reps - Mini Squat with Counter Support  - 1 x daily - 7 x weekly - 2 sets - 5-10 reps - Modified Thomas Stretch  - 1 x daily - 7 x weekly - 3 sets - 15-30s hold - Standing Hip Flexor Stretch  - 1 x daily - 7 x weekly - 3 sets - 15-30s hold  ASSESSMENT:  CLINICAL IMPRESSION:  Patient arrived to session noting continued improvement in symptoms, though has not been able to be compliant with HEP secondary to sickness. Patient tolerated all activities this date and showed an improvement in static stability with head turns. Patient will  continue to benefit from skilled PT.  OBJECTIVE IMPAIRMENTS: Abnormal gait, decreased activity tolerance, decreased balance, decreased coordination, decreased endurance, decreased mobility, difficulty walking, decreased ROM, decreased strength, impaired flexibility, improper body mechanics, postural dysfunction, obesity, and pain.   ACTIVITY LIMITATIONS: bending, sitting, standing, squatting, sleeping, stairs, transfers, and bed mobility  PARTICIPATION LIMITATIONS: interpersonal relationship, community activity, and church  PERSONAL FACTORS: Past/current experiences, Time since onset of injury/illness/exacerbation, and 3+ comorbidities: anxiety, arthritis, cancer history, DM, GERD, HLD, HTN, kidney disease, ulcerative colitis, restless leg syndrome, lumbar disc disease  are also affecting patient's functional outcome.   REHAB POTENTIAL: Good  CLINICAL DECISION MAKING: Evolving/moderate complexity  EVALUATION COMPLEXITY: Moderate   GOALS: Goals reviewed with patient? Yes  SHORT TERM GOALS: Target date: 09/11/2024 Patient will show compliance with initial HEP.  Baseline: Goal status: GOAL MET, 09/13/2024  2.  Patient will report pain levels no greater than 7/10 to show overall improved quality of life. Baseline:  Goal status: GOAL MET, 10/10/2024   LONG TERM GOALS: Target date: 11/21/2024  Patient will be independent with final HEP in order to maintain and progress upon functional gains made within PT. Baseline:  Goal status: goal ongoing, 10/10/2024  2.  Patient will report pain levels no greater than 4/10 to show overall improved quality of life. Baseline:  Goal status: goal ongoing, 10/10/2024  3.  Patient will increase PSFS to at least 3.66 in order to show a significant improvement in subjective disability rating. Baseline:  Goal status: Igoal ongoing, 10/10/2024  4.  Patient will increase bilat hip abduction strength to at least 4-/5 in order to improve biomechanics  with functional mobility. Baseline:  Goal status: goal ongoing, 10/10/2024  5.  Patient will increase bilat hip extension strength to at least 3+/5 in order to improve biomechanics with functional mobility. Baseline:  Goal status: goal ongoing, 10/10/2024  6.  Patient will increase Rt hip flexion AROM to at least 90deg in order to improve functional mobility. Baseline:  Goal status: goal ongoing, 10/10/2024  7.  Patient will increase 5xSTS to at least 16.23s in order to decrease chances of falling. Baseline:  Goal status: goal ongoing, 10/10/2024   PLAN:  PT FREQUENCY: 1-2x/week  PT DURATION: 8 weeks  PLANNED  INTERVENTIONS: [id number]- PT Re-evaluation, 97750- Physical Performance Testing, 97110-Therapeutic exercises, 97530- Therapeutic activity, [id number]- Neuromuscular re-education, 97535- Self Care, [id number]- Manual therapy, [id number]- Gait training, [phone]- Orthotic Initial, [phone]- Orthotic/Prosthetic subsequent, [phone]- Canalith repositioning, [phone]- Aquatic Therapy, [phone]- Electrical stimulation (unattended), [phone]- Electrical stimulation (manual), [id number]- Vasopneumatic device, [id number]- Ultrasound, [id number]- Traction (mechanical), [id number]- Ionotophoresis 4mg /ml Dexamethasone , [id number] (1-2 muscles), [id number] (3+ muscles)- Dry Needling, Patient/Family education, Balance training, Stair training, Taping, Joint mobilization, Joint manipulation, Spinal manipulation, Spinal mobilization, Scar mobilization, Compression bandaging, Vestibular training, DME instructions, Cryotherapy, and Moist heat  PLAN FOR NEXT SESSION:  hip flexor stretching, general hip/LE strengthening, balance (static and functional), eccentric hip flexion?   Susannah Daring, PT, DPT 11/05/2024 11:50 AM      "

## 2024-11-06 ENCOUNTER — Ambulatory Visit: Admitting: Hematology and Oncology

## 2024-11-06 NOTE — Therapy (Signed)
 " OUTPATIENT PHYSICAL THERAPY TREATMENT    Patient Name: FRANCE LUSTY MRN: [id number] DOB:29-Oct-1946, 79 y.o., female Today's Date: 11/07/2024  END OF SESSION:  PT End of Session - 11/07/24 1021     Visit Number 12    Number of Visits 22    Date for Recertification  11/21/24    Authorization Type [employer] $15 COPAY    Progress Note Due on Visit 10    PT Start Time 1021    PT Stop Time 1059    PT Time Calculation (min) 38 min    Activity Tolerance Patient tolerated treatment well;Patient limited by pain    Behavior During Therapy [facility] for tasks assessed/performed             Past Medical History:  Diagnosis Date   Anxiety    Arthritis    Back, Hip- right   Atypical chest pain 06/25/2021   Back pain    Cancer (HCC)    right breast   Cataract    Colitis    Coronary artery calcification 02/20/2020   Diabetes mellitus without complication (HCC)    Type II   Edema 03/05/2021   Edema of both lower extremities    Endometriosis    Family history of adverse reaction to anesthesia    Father - N/V - blood pressure; daughter N/V   Family history of breast cancer    Family history of colon cancer    Family history of kidney cancer    Family history of prostate cancer    Fatty liver    GERD (gastroesophageal reflux disease)    History of hiatal hernia    History of kidney stones    History of ulcerative colitis    Hypertension    Insomnia    Joint pain    Kidney problem    Lower extremity edema 03/05/2021   Lumbar disc disease    L4 L5   Neuropathy    Osteoarthritis    Other hyperlipidemia    Palpitations    Personal history of radiation therapy    Pneumonia    1983, 2003   Pure hypercholesterolemia 02/20/2020   PVCs (premature ventricular contractions)    benign   Umbilical hernia    Vitamin D  deficiency    Wears glasses    Past Surgical History:  Procedure Laterality Date   APPENDECTOMY     BREAST BIOPSY  08-13-2009  DR TSUEI   EXCISION LEFT  NIPPLE DUCT   BREAST EXCISIONAL BIOPSY Left    x2   BREAST EXCISIONAL BIOPSY Right    BREAST LUMPECTOMY Right 11/16/2018   invasive ductal   CHOLECYSTECTOMY  1992   COLON RESECTION  1986   ULCERATIVE COLITIS   COLON SURGERY     ESOPHAGEAL MANOMETRY N/A 06/11/2013   Procedure: ESOPHAGEAL MANOMETRY (EM);  Surgeon: Oliva FORBES Boots, MD;  Location: [facility] ENDOS[facility];  Service: Endoscopy;  Laterality: N/A;   ESOPHAGOGASTRODUODENOSCOPY (EGD) WITH PROPOFOL  N/A 05/31/2016   Procedure: ESOPHAGOGASTRODUODENOSCOPY (EGD) WITH PROPOFOL ;  Surgeon: Oliva Boots, MD;  Location: [facility] ENDOSCOPY;  Service: Endoscopy;  Laterality: N/A;   EXCISION RIGHT BREAST MASS   10-20-2005  DR MAUDE YOUNG   EYE SURGERY Bilateral 2023   cataract   FRACTURE SURGERY     left arm   kidney stone removed  12/2009   KNEE ARTHROSCOPY Left    MCL   LEFT THUMB CARPOMETACARPAL JOINT SUSPENSIONPLASTY  04-06-2006  DR SISSY   LEFT WRIST ARTHROSCOPY W/ DEBRIDEMENT AND REMOVAL CYST  03-26-2004  DR SISSY   MYOMECTOMY     RADIOACTIVE SEED GUIDED PARTIAL MASTECTOMY WITH AXILLARY SENTINEL LYMPH NODE BIOPSY Right 11/16/2018   Procedure: RIGHT BREAST RADIOACTIVE SEED GUIDED PARTIAL MASTECTOMY WITH  SENTINEL  NODE BIOPSY;  Surgeon: Vernetta Berg, MD;  Location: [facility] OR;  Service: General;  Laterality: Right;   RIGHT COLECTOMY     RIGHT URETEROSCOPIC STONE EXTRACTION  12-11-2009  DR [facility]LEEN [facility]   TENDON RELEASE     TONSILLECTOMY     TOTAL HIP ARTHROPLASTY Right 11/26/2016   Procedure: RIGHT TOTAL HIP ARTHROPLASTY ANTERIOR APPROACH;  Surgeon: Lonni CINDERELLA Vernetta, MD;  Location: [facility] ORS;  Service: Orthopedics;  Laterality: Right;   TOTAL HIP ARTHROPLASTY Left 12/06/2023   Procedure: LEFT TOTAL HIP ARTHROPLASTY ANTERIOR APPROACH;  Surgeon: Vernetta Lonni CINDERELLA, MD;  Location: [facility] OR;  Service: Orthopedics;  Laterality: Left;   TOTAL KNEE ARTHROPLASTY Left 12/02/2017   Procedure: LEFT TOTAL KNEE ARTHROPLASTY;  Surgeon: Vernetta Lonni CINDERELLA,  MD;  Location: [facility] ORS;  Service: Orthopedics;  Laterality: Left;   TOTAL KNEE ARTHROPLASTY Right 08/03/2022   Procedure: RIGHT TOTAL KNEE ARTHROPLASTY;  Surgeon: Vernetta Lonni CINDERELLA, MD;  Location: [facility] OR;  Service: Orthopedics;  Laterality: Right;   WRIST SURGERY     both   Patient Active Problem List   Diagnosis Date Noted   OSA on CPAP 10/04/2024   Vaginal atrophy 06/29/2024   History of iron deficiency 05/24/2024   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   Falls 05/01/2024   Iron deficiency 05/01/2024   Overactive bladder 02/22/2024   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/2023   SUI (stress urinary incontinence, female) 11/22/2023   RLS (restless legs syndrome) 09/22/2023   Other hyperlipidemia 06/08/2023   Unilateral primary osteoarthritis, left hip 05/11/2023   Depression 03/16/2023   Allergic rhinitis 12/29/2022   Seasonal affective disorder 12/01/2022   BMI 32.0-32.9,adult 12/01/2022   Obesity, Beginning BMI 37.59 12/01/2022   OA (osteoarthritis) of knee 08/03/2022   Status post total right knee replacement 08/03/2022   Stress 07/19/2022   Other constipation 07/19/2022   Atypical chest pain 06/25/2021   Class 2 severe obesity with serious comorbidity and body mass index (BMI) of 37.0 to 37.9 in adult 06/03/2021   Vitamin D  deficiency 06/03/2021   Other fatigue 04/22/2021   SOB (shortness of breath) on exertion 04/22/2021   Lower extremity edema 03/05/2021   Coronary artery calcification 02/20/2020   Pure hypercholesterolemia 02/20/2020   Unilateral primary osteoarthritis, right knee 01/03/2019   Genetic testing 12/07/2018   Family history of breast cancer    Family history of prostate cancer    Family history of colon cancer    Family history of kidney cancer    Malignant neoplasm of upper-outer quadrant of right breast in female, 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/09/2018   Diabetic neuropathy, type II diabetes mellitus (HCC) 11/09/2018   Status post  total left knee replacement 12/02/2017   Trochanteric bursitis, right hip 05/10/2017   Status post total replacement of right hip 11/26/2016   DDD (degenerative disc disease), 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   Low back pain 04/23/2015   Hiatal hernia 04/13/2013   Diabetes mellitus (HCC) 01/26/2013   Umbilical hernia 07/24/2012   Essential hypertension     PCP: Carlin Dale Gull, MD   REFERRING PROVIDER: Lonell Sprang, DO  REFERRING DIAG: M76.11 (ICD-10-CM) - Psoas tendinitis of right side M25.551,G89.29,Z96.641 (ICD-10-CM) - Chronic hip pain after total replacement of right hip joint R26.89 (ICD-10-CM) - Abnormality of gait due  to impairment of balance M25.551,M25.552 (ICD-10-CM) - Bilateral hip pain  THERAPY DIAG:  Pain in right hip  Other low back pain  Muscle weakness (generalized)  Abnormal posture  Unsteadiness on feet  Other abnormalities of gait and mobility  Rationale for Evaluation and Treatment: Rehabilitation  ONSET DATE: following HHPT for TKA 3 years ago   SUBJECTIVE:   SUBJECTIVE STATEMENT: Patient reports that her hip is doing well.   PERTINENT HISTORY: Patient has been experiencing Rt hip pain (mainly iliopsoas) and balance issues. Patient has received injections from Dr. Burnetta for iliopsoas pain that assisted with pain. Patient has undergone bilat TKA and bilat THA with no other injuries or surgeries to surrounding areas. Patient intermittently experiencing low back pain. Patient has experienced 5 falls in past year with 2 major falls leading to injuries; has since been cleared by medical team.  May need to check BP prior to sessions   See PMH or personal factors for in depth comorbidities    PAIN:  NPRS scale: 2/10 back ache  Pain location: Rt hip  Pain description: sharp when moving, dull when sitting  Aggravating factors: stairs, into car, bed mobility/transfers  Relieving factors: heating pad, rest    PRECAUTIONS: Fall  RED FLAGS: Bowel or  bladder incontinence: No and Cauda equina syndrome: No   WEIGHT BEARING RESTRICTIONS: No  FALLS:  Has patient fallen in last 6 months? Yes. Number of falls 5; 2 caused injuries (one from a potential medication issue that has since been changed, had on clogs and landed flat with arms by side hitting face, bruised ribs, etc.)  LIVING ENVIRONMENT: Lives with: lives alone Lives in: House/apartment Stairs: Yes: Internal: 1 steps; on right going up Has following equipment at home: Single point cane, Walker - 2 wheeled, and Grab bars  OCCUPATION: retired   PLOF: Independent  PATIENT GOALS: be more independent, reduce pain to be more active, get down on the floor and back   NEXT MD VISIT: not scheduled with Dr. Burnetta, follow up with Dr. Vernetta Sep 05, 2024  OBJECTIVE:  Note: Objective measures were completed at Evaluation unless otherwise noted.  DIAGNOSTIC FINDINGS:  An AP pelvis shows bilateral total hip arthroplasties with no complicating  features.   PATIENT SURVEYS:  PSFS: THE PATIENT SPECIFIC FUNCTIONAL SCALE  Place score of 0-10 (0 = unable to perform activity and 10 = able to perform activity at the same level as before injury or problem)  Activity Date: 08/21/2024 11/07/2024   Get in and out of car   1 7   2. Get in and out of bed   1 7   3. Go up and down steps   3 5   4.      Total Score 1.66 6.33     Total Score = Sum of activity scores/number of activities  Minimally Detectable Change: 3 points (for single activity); 2 points (for average score)  [facility] (nd). The Patient Specific Functional Scale . Retrieved from [email or web address]   COGNITION: 08/21/2024 Overall cognitive status: Within functional limits for tasks assessed     SENSATION: 08/21/2024 Not tested  EDEMA:  08/21/2024 Not tested   MUSCLE LENGTH: 08/21/2024 Not formally assessed secondary to pain   POSTURE:   08/21/2024 rounded shoulders, forward head, and increased thoracic kyphosis  PALPATION: 08/21/2024 TTP at Rt iliopsoas, Rt lateral glutes, Rt posterior glutes   LOWER EXTREMITY ROM:  ROM Right Eval 08/21/2024 Left Eval 08/21/2024  Hip flexion AROM: 63deg  PROM: ~95deg AROM: 110deg with back pain noted   Hip extension    Hip abduction    Hip adduction    Hip internal rotation    Hip external rotation    Knee flexion    Knee extension    Ankle dorsiflexion    Ankle plantarflexion    Ankle inversion    Ankle eversion     (Blank rows = not tested)  LOWER EXTREMITY MMT:  MMT Right Eval 08/21/2024 Left Eval 08/21/2024  Hip flexion (seated) 2+/5 3+/5  Hip extension (prone) 2+/5 3-/5  Hip abduction (sidelying) 2+/5 3+/5  Hip adduction    Hip internal rotation    Hip external rotation    Knee flexion(seated) 4+/5 5/5  Knee extension(seated) 3-/5 5/5  Ankle dorsiflexion(seated) 5/5 5/5  Ankle plantarflexion    Ankle inversion    Ankle eversion     (Blank rows = not tested)  LOWER EXTREMITY SPECIAL TESTS:  08/21/2024 No testing.    FUNCTIONAL TESTS:  08/21/2024 5 times sit to stand: 18.53s  GAIT: 08/21/2024 Distance walked: not formally assessed  Assistive device utilized: None Level of assistance: supervision  Comments: antalgic gait pattern    [facility] PT Assessment - 09/05/24 0001       Functional Gait  Assessment   Gait Level Surface Walks 20 ft, slow speed, abnormal gait pattern, evidence for imbalance or deviates 10-15 in outside of the 12 in walkway width. Requires more than 7 sec to ambulate 20 ft.    Change in Gait Speed Able to change speed, demonstrates mild gait deviations, deviates 6-10 in outside of the 12 in walkway width, or no gait deviations, unable to achieve a major change in velocity, or uses a change in velocity, or uses an assistive device.    Gait with Horizontal Head Turns Performs head turns with moderate changes in gait velocity,  slows down, deviates 10-15 in outside 12 in walkway width but recovers, can continue to walk.    Gait with Vertical Head Turns Performs task with slight change in gait velocity (eg, minor disruption to smooth gait path), deviates 6 - 10 in outside 12 in walkway width or uses assistive device    Gait and Pivot Turn Pivot turns safely in greater than 3 sec and stops with no loss of balance, or pivot turns safely within 3 sec and stops with mild imbalance, requires small steps to catch balance.    Step Over Obstacle Is able to step over one shoe box (4.5 in total height) but must slow down and adjust steps to clear box safely. May require verbal cueing.    Gait with Narrow Base of Support Ambulates less than 4 steps heel to toe or cannot perform without assistance.    Gait with Eyes Closed Cannot walk 20 ft without assistance, severe gait deviations or imbalance, deviates greater than 15 in outside 12 in walkway width or will not attempt task.    Ambulating Backwards Walks 20 ft, slow speed, abnormal gait pattern, evidence for imbalance, deviates 10-15 in outside 12 in walkway width.    Steps Alternating feet, must use rail.    Total Score 12    FGA comment: Max score 30  Interpretation: 25-28 = low risk fall   19-24 = medium risk fall  < 19 = high risk fall                             TREATMENT  DATE: 11/07/2024 TherEx:  Recumbent bike level 3 for 8 minutes   Neuro Re-Ed:  Staggered stance with horizontal and vertical head turns with both feet fwd ; 1x20 each direction and each foot fwd Lateral walks on foam 3x down and back with intermittent UE use on // bars, though improved compared to last session Step taps to 4 step without UE use with improvement in performance with time 1x20 taps  Step taps to 6 step without UE use with improvement in performance with time 2x20 taps    TREATMENT        DATE: 11/05/2024 TherEx:  Recumbent bike level 3 for 8 minutes   Neuro Re-Ed:  Narrow  stance with horizontal and vertical head turns 1x20 each direction Staggered stance with horizontal and vertical head turns with both feet fwd ; 1x16 each direction and each foot fwd Lateral walks on foam pad with transition off/on ; intermittent UE use on // bars and increased difficulty with stepping to Lt  Step taps to 4 step without UE use with improvement in performance with time   TREATMENT        DATE: 10/10/2024 TherEx:  Recumbent bike level 5 for 8 minutes  Thomas/hip flexor stretch on mat table 3x60s with PT applying STM to proximal quad/hip flexor on Rt side   Neuro Re-Ed: all activities below performed with CGA  Narrow stance with horizontal head turns 1x20 with intermittent finger tip touch to // bars for stability  Staggered stance stance with horizontal head turns 1x20 with each foot fwd ; intermittent finger tip touch to // bars for stability  PT challenging stepping reactions in all direction (fwd, back, Rt, Lt) with patient performing with appropriate stepping reactions and no required UE support on // bars other than for a fwd trial Reciprocal stepping over small hurdles with CGA and HHA 1x2 down and back with intermittent toe catching on hurdle and intermittent lateral LOB    TREATMENT        DATE: 10/08/2024 TherEx:  Recumbent bike level 4 for 8 minutes  Thomas/hip flexor stretch on mat table 3x45s with PT applying STM to proximal quad/hip flexor on Rt side  Patient noting discomfort/pain in lateral thigh during last round   Neuro Re-Ed:  Reciprocal step taps to 6 steps 2x10 with intermittent UE use on parallel bars  No pain noted with activity  Rocker board fwd/back to focus on control and ankle strategy 2x20 taps  Rocker board lateral to focus on control and ankle/hip 1x20 taps  PT discussed balance strategies and how they are challenged with each activity above  PT discussed difficulty levels with different bases of support, how head turns challenge visual system  and vestibular system, and why we start with challenging static vs dynamic      PATIENT EDUCATION:  Education details: HEP, POC, interventions, strength and flexibility  Person educated: Patient Education method: Explanation, Demonstration, Tactile cues, Verbal cues, and Handouts Education comprehension: verbalized understanding, returned demonstration, verbal cues required, and tactile cues required  HOME EXERCISE PROGRAM: Access Code: [email or web address] URL: [email or web address] Date: 08/21/2024 Prepared by: Susannah Daring  Exercises - Standing Hip Abduction with Counter Support  - 1 x daily - 7 x weekly - 2 sets - 5-10 reps - Standing Hip Extension with Counter Support  - 1 x daily - 7 x weekly - 2 sets - 5-10 reps - Mini Squat with Counter Support  - 1 x daily - 7 x weekly - 2 sets -  5-10 reps - Modified Thomas Stretch  - 1 x daily - 7 x weekly - 3 sets - 15-30s hold - Standing Hip Flexor Stretch  - 1 x daily - 7 x weekly - 3 sets - 15-30s hold  ASSESSMENT:  CLINICAL IMPRESSION:  Patient arrived to session noting no new changes. Patient tolerated all activities this date and has improved upon balance reactions. Patient will continue to benefit from skilled PT.  OBJECTIVE IMPAIRMENTS: Abnormal gait, decreased activity tolerance, decreased balance, decreased coordination, decreased endurance, decreased mobility, difficulty walking, decreased ROM, decreased strength, impaired flexibility, improper body mechanics, postural dysfunction, obesity, and pain.   ACTIVITY LIMITATIONS: bending, sitting, standing, squatting, sleeping, stairs, transfers, and bed mobility  PARTICIPATION LIMITATIONS: interpersonal relationship, community activity, and church  PERSONAL FACTORS: Past/current experiences, Time since onset of injury/illness/exacerbation, and 3+ comorbidities: anxiety, arthritis, cancer history, DM, GERD, HLD, HTN, kidney disease, ulcerative colitis, restless leg syndrome,  lumbar disc disease  are also affecting patient's functional outcome.   REHAB POTENTIAL: Good  CLINICAL DECISION MAKING: Evolving/moderate complexity  EVALUATION COMPLEXITY: Moderate   GOALS: Goals reviewed with patient? Yes  SHORT TERM GOALS: Target date: 09/11/2024 Patient will show compliance with initial HEP.  Baseline: Goal status: GOAL MET, 09/13/2024  2.  Patient will report pain levels no greater than 7/10 to show overall improved quality of life. Baseline:  Goal status: GOAL MET, 10/10/2024   LONG TERM GOALS: Target date: 11/21/2024  Patient will be independent with final HEP in order to maintain and progress upon functional gains made within PT. Baseline:  Goal status: goal ongoing, 10/10/2024  2.  Patient will report pain levels no greater than 4/10 to show overall improved quality of life. Baseline:  Goal status: goal ongoing, 10/10/2024  3.  Patient will increase PSFS to at least 3.66 in order to show a significant improvement in subjective disability rating. Baseline:  Goal status: Igoal ongoing, 10/10/2024  4.  Patient will increase bilat hip abduction strength to at least 4-/5 in order to improve biomechanics with functional mobility. Baseline:  Goal status: goal ongoing, 10/10/2024  5.  Patient will increase bilat hip extension strength to at least 3+/5 in order to improve biomechanics with functional mobility. Baseline:  Goal status: goal ongoing, 10/10/2024  6.  Patient will increase Rt hip flexion AROM to at least 90deg in order to improve functional mobility. Baseline:  Goal status: goal ongoing, 10/10/2024  7.  Patient will increase 5xSTS to at least 16.23s in order to decrease chances of falling. Baseline:  Goal status: goal ongoing, 10/10/2024   PLAN:  PT FREQUENCY: 1-2x/week  PT DURATION: 8 weeks  PLANNED INTERVENTIONS: [id number]- PT Re-evaluation, 97750- Physical Performance Testing, 97110-Therapeutic exercises, 97530- Therapeutic  activity, [id number]- Neuromuscular re-education, 97535- Self Care, [id number]- Manual therapy, [id number]- Gait training, [phone]- Orthotic Initial, [phone]- Orthotic/Prosthetic subsequent, [phone]- Canalith repositioning, [phone]- Aquatic Therapy, [phone]- Electrical stimulation (unattended), [phone]- Electrical stimulation (manual), [id number]- Vasopneumatic device, [id number]- Ultrasound, [id number]- Traction (mechanical), [id number]- Ionotophoresis 4mg /ml Dexamethasone , [id number] (1-2 muscles), [id number] (3+ muscles)- Dry Needling, Patient/Family education, Balance training, Stair training, Taping, Joint mobilization, Joint manipulation, Spinal manipulation, Spinal mobilization, Scar mobilization, Compression bandaging, Vestibular training, DME instructions, Cryotherapy, and Moist heat  PLAN FOR NEXT SESSION:   hip flexor stretching, general hip/LE strengthening, balance (static and functional), eccentric hip flexion?   Susannah Daring, PT, DPT 11/07/2024 11:44 AM      "

## 2024-11-07 ENCOUNTER — Ambulatory Visit

## 2024-11-07 DIAGNOSIS — R2681 Unsteadiness on feet: Secondary | ICD-10-CM

## 2024-11-07 DIAGNOSIS — M6281 Muscle weakness (generalized): Secondary | ICD-10-CM

## 2024-11-07 DIAGNOSIS — M25551 Pain in right hip: Secondary | ICD-10-CM

## 2024-11-07 DIAGNOSIS — R2689 Other abnormalities of gait and mobility: Secondary | ICD-10-CM

## 2024-11-07 DIAGNOSIS — R293 Abnormal posture: Secondary | ICD-10-CM

## 2024-11-07 DIAGNOSIS — M5459 Other low back pain: Secondary | ICD-10-CM | POA: Diagnosis not present

## 2024-11-12 ENCOUNTER — Encounter: Payer: Self-pay | Admitting: *Deleted

## 2024-11-12 ENCOUNTER — Encounter (INDEPENDENT_AMBULATORY_CARE_PROVIDER_SITE_OTHER): Payer: Self-pay | Admitting: Family Medicine

## 2024-11-12 ENCOUNTER — Ambulatory Visit (INDEPENDENT_AMBULATORY_CARE_PROVIDER_SITE_OTHER): Admitting: Family Medicine

## 2024-11-12 VITALS — BP 106/70 | HR 70 | Temp 97.6°F | Ht 63.0 in | Wt 175.0 lb

## 2024-11-12 DIAGNOSIS — E782 Mixed hyperlipidemia: Secondary | ICD-10-CM | POA: Diagnosis not present

## 2024-11-12 DIAGNOSIS — E119 Type 2 diabetes mellitus without complications: Secondary | ICD-10-CM | POA: Diagnosis not present

## 2024-11-12 DIAGNOSIS — Z7985 Long-term (current) use of injectable non-insulin antidiabetic drugs: Secondary | ICD-10-CM

## 2024-11-12 DIAGNOSIS — E669 Obesity, unspecified: Secondary | ICD-10-CM | POA: Diagnosis not present

## 2024-11-12 DIAGNOSIS — E1140 Type 2 diabetes mellitus with diabetic neuropathy, unspecified: Secondary | ICD-10-CM

## 2024-11-12 DIAGNOSIS — Z6831 Body mass index (BMI) 31.0-31.9, adult: Secondary | ICD-10-CM | POA: Diagnosis not present

## 2024-11-12 DIAGNOSIS — N3281 Overactive bladder: Secondary | ICD-10-CM | POA: Diagnosis not present

## 2024-11-12 DIAGNOSIS — F3289 Other specified depressive episodes: Secondary | ICD-10-CM

## 2024-11-12 MED ORDER — ROSUVASTATIN CALCIUM 20 MG PO TABS: 20.0000 mg | ORAL_TABLET | Freq: Every day | ORAL | 0 refills | Status: AC

## 2024-11-12 NOTE — Progress Notes (Signed)
 "  Office: [phone]  /  Fax: [phone]  WEIGHT SUMMARY AND BIOMETRICS  Anthropometric Measurements Height: 5' 3 (1.6 m) Weight: 175 lb (79.4 kg) BMI (Calculated): 31.01 Weight at Last Visit: 176 lb Weight Lost Since Last Visit: 1 lb Weight Gained Since Last Visit: 0 Starting Weight: 219 lb Total Weight Loss (lbs): 44 lb (20 kg) Peak Weight: 222 lb   Body Composition  Body Fat %: 46.6 % Fat Mass (lbs): 81.6 lbs Muscle Mass (lbs): 88.6 lbs Total Body Water  (lbs): 69 lbs Visceral Fat Rating : 14   Other Clinical Data Fasting: no Labs: no Today's Visit #: 43 Starting Date: 04/22/21    Chief Complaint: OBESITY    History of Present Illness Lauren [name] is a 79 year old female with obesity who presents for a follow-up on her obesity treatment plan and progress assessment.  She has not adhered to her prescribed category two eating plan over the past month but has lost one pound since her last visit, attributing this to the holiday season. She engages in physical therapy and is increasing her walking, managing 60 minutes of walking two days a week.  Her A1c is now 5.5. She is not very hungry at night and often eats light meals such as tacos or potato soup. She is currently on Mounjaro  with a three-month supply.  She has hyperlipidemia and is working on diet, exercise, and weight loss to manage this condition. She is trying to eat lower cholesterol foods, more plants, and vegetables. She is on Crestor  20 mg daily.  She mentions family stressors during the holidays, particularly related to her daughter's family. She describes a strained relationship with her daughter, who is currently upset with her. She wants to focus on her own well-being.  She is undergoing dry needling therapy for pain and balence, which she finds beneficial. Her physical therapist is pregnant and will be unavailable soon, but she plans to continue her therapy with another provider.       PHYSICAL EXAM:  Blood pressure 106/70, pulse 70, temperature 97.6 F (36.4 C), height 5' 3 (1.6 m), weight 175 lb (79.4 kg), SpO2 94%. Body mass index is 31 kg/m.  DIAGNOSTIC DATA REVIEWED:  BMET    Component Value Date/Time   NA 140 05/24/2024 1032   K 3.5 05/24/2024 1032   CL 101 05/24/2024 1032   CO2 21 05/24/2024 1032   GLUCOSE 68 (L) 05/24/2024 1032   GLUCOSE 118 (H) 12/07/2023 0736   BUN 7 (L) 05/24/2024 1032   CREATININE 0.81 05/24/2024 1032   CREATININE 0.87 06/08/2022 1439   CALCIUM  9.3 05/24/2024 1032   GFRNONAA 60 (L) 12/07/2023 0736   GFRNONAA >60 06/08/2022 1439   GFRAA >60 02/04/2020 1212   GFRAA >60 11/09/2018 1405   Lab Results  Component Value Date   HGBA1C 6.0 (H) 06/08/2023   HGBA1C 6.7 (H) 11/16/2016   Lab Results  Component Value Date   INSULIN  25.6 (H) 06/08/2023   INSULIN  22.3 04/22/2021   Lab Results  Component Value Date   TSH 2.450 04/11/2024   CBC    Component Value Date/Time   WBC 10.9 (H) 11/30/2023 1347   RBC 4.25 11/30/2023 1347   HGB 12.4 11/30/2023 1347   HGB 12.3 06/08/2022 1439   HGB 11.8 05/11/2022 0954   HCT 37.5 11/30/2023 1347   HCT 35.1 05/11/2022 0954   PLT 327 11/30/2023 1347   PLT 272 06/08/2022 1439   PLT 265 05/11/2022 0954  MCV 88.2 11/30/2023 1347   MCV 87 05/11/2022 0954   MCH 29.2 11/30/2023 1347   MCHC 33.1 11/30/2023 1347   RDW 13.3 11/30/2023 1347   RDW 13.2 05/11/2022 0954   Iron Studies    Component Value Date/Time   IRON 61 06/28/2024 0918   TIBC 292 06/28/2024 0918   FERRITIN 82 06/28/2024 0918   IRONPCTSAT 21 06/28/2024 0918   Lipid Panel     Component Value Date/Time   CHOL 123 06/08/2023 0941   TRIG 75 06/08/2023 0941   HDL 67 06/08/2023 0941   LDLCALC 41 06/08/2023 0941   Hepatic Function Panel     Component Value Date/Time   PROT 6.6 05/24/2024 1032   ALBUMIN  4.3 05/24/2024 1032   AST 17 05/24/2024 1032   AST 16 06/08/2022 1439   ALT 12 05/24/2024 1032   ALT 12  06/08/2022 1439   ALKPHOS 72 05/24/2024 1032   BILITOT 0.4 05/24/2024 1032   BILITOT 0.4 06/08/2022 1439   BILIDIR <0.1 (L) 07/30/2015 1338   IBILI NOT CALCULATED 07/30/2015 1338      Component Value Date/Time   TSH 2.450 04/11/2024 1208   TSH 2.150 05/11/2022 0954   Nutritional Lab Results  Component Value Date   VD25OH 57.5 02/07/2024   VD25OH 124.0 (H) 09/22/2023   VD25OH 100.0 06/08/2023     Assessment and Plan Assessment & Plan Obesity Management is ongoing with a focus on weight loss and muscle strengthening. She has lost one pound since the last visit, though not following the prescribed eating plan. Engaged in physical therapy and increasing walking to 60 minutes twice a week. Current BMI is slightly above the target, but the focus is on reducing fat percentage and visceral fat. Muscle mass is important for overall health and longevity. She is not interested in increasing Mounjaro  due to current A1c levels and potential side effects. - Continue physical therapy with emphasis on core strengthening exercises. - Encouraged increased protein intake to support muscle mass. - Provided meal planning recipes to ensure adequate protein and calorie control. - Meal planning strategies discussed today  Mixed hyperlipidemia Managed with Crestor . She is working on dietary changes to lower cholesterol, including increased intake of plants and vegetables. Requests a refill of Crestor . - Refilled Crestor  20 mg daily. - Continue dietary modifications to lower cholesterol.  Type 2 diabetes mellitus  Type 2 diabetes is well-controlled with an A1c of 5.5. She is not interested in increasing Mounjaro  due to current A1c levels and potential side effects.  - Continue Mounjaro  at current dose to avoid malnutrition - Continue diet, exercise and weight loss as discussed today as an important part of the treatment plan  OAB OAB management includes upcoming nerve stimulation therapy, which may  reduce the need for Gemtesa , which is very expensive - Proceed with nerve stimulation therapy and will follow. Future exercise options may exist when this is better controlled.      Patients who are on anti-obesity medications are counseled on the importance of maintaining healthy lifestyle habits, including balanced nutrition, regular physical activity, and behavioral modifications,  Medication is an adjunct to, not a replacement for, lifestyle changes and that the long-term success and weight maintenance depend on continued adherence to these strategies.   Zniyah was informed of the importance of frequent follow up visits to maximize her success with intensive lifestyle modifications for her obesity and obesity related health conditions as recommended by USPSTF and CMS guidelines  I personally spent a total  of 40 minutes in the care of the patient today including preparing to see the patient, reviewing separately obtained history, performing a medically appropriate evaluation of current problems, placing orders in the EMR, documenting clinical information in the EMR, customized nutritional counseling for their specific health and social needs, independently interpreting results, discussing results with the patient and educating them on how these results can affect their health and weight, and providing recipes that meet their specific nutritional needs.  Louann Penton, MD   "

## 2024-11-16 ENCOUNTER — Encounter

## 2024-11-19 NOTE — Therapy (Signed)
 " OUTPATIENT PHYSICAL THERAPY TREATMENT / DISCHARGE   Patient Name: Lauren [name] MRN: [id number] DOB:[birth year], 79 y.o., female Today's Date: 11/20/2024  END OF SESSION:  PT End of Session - 11/20/24 1111     Visit Number 13    Number of Visits 22    Date for Recertification  11/21/24    Authorization Type [employer] $15 COPAY    Progress Note Due on Visit 10    PT Start Time 1109    PT Stop Time 1150    PT Time Calculation (min) 41 min    Activity Tolerance Patient tolerated treatment well;Patient limited by pain    Behavior During Therapy [facility] for tasks assessed/performed              Past Medical History:  Diagnosis Date   Anxiety    Arthritis    Back, Hip- right   Atypical chest pain 06/25/2021   Back pain    Cancer (HCC)    right breast   Cataract    Colitis    Coronary artery calcification 02/20/2020   Diabetes mellitus without complication (HCC)    Type II   Edema 03/05/2021   Edema of both lower extremities    Endometriosis    Family history of adverse reaction to anesthesia    Father - N/V - blood pressure; daughter N/V   Family history of breast cancer    Family history of colon cancer    Family history of kidney cancer    Family history of prostate cancer    Fatty liver    GERD (gastroesophageal reflux disease)    History of hiatal hernia    History of kidney stones    History of ulcerative colitis    Hypertension    Insomnia    Joint pain    Kidney problem    Lower extremity edema 03/05/2021   Lumbar disc disease    L4 L5   Neuropathy    Osteoarthritis    Other hyperlipidemia    Palpitations    Personal history of radiation therapy    Pneumonia    1983, 2003   Pure hypercholesterolemia 02/20/2020   PVCs (premature ventricular contractions)    benign   Umbilical hernia    Vitamin D  deficiency    Wears glasses    Past Surgical History:  Procedure Laterality Date   APPENDECTOMY     BREAST BIOPSY  08-13-2009  DR TSUEI    EXCISION LEFT NIPPLE DUCT   BREAST EXCISIONAL BIOPSY Left    x2   BREAST EXCISIONAL BIOPSY Right    BREAST LUMPECTOMY Right 11/16/2018   invasive ductal   CHOLECYSTECTOMY  1992   COLON RESECTION  1986   ULCERATIVE COLITIS   COLON SURGERY     ESOPHAGEAL MANOMETRY N/A 06/11/2013   Procedure: ESOPHAGEAL MANOMETRY (EM);  Surgeon: Oliva FORBES Boots, MD;  Location: [facility] ENDOSCOPY;  Service: Endoscopy;  Laterality: N/A;   ESOPHAGOGASTRODUODENOSCOPY (EGD) WITH PROPOFOL  N/A 05/31/2016   Procedure: ESOPHAGOGASTRODUODENOSCOPY (EGD) WITH PROPOFOL ;  Surgeon: Oliva Boots, MD;  Location: [facility] ENDOSCOPY;  Service: Endoscopy;  Laterality: N/A;   EXCISION RIGHT BREAST MASS   10-20-2005  DR MAUDE YOUNG   EYE SURGERY Bilateral 2023   cataract   FRACTURE SURGERY     left arm   kidney stone removed  12/2009   KNEE ARTHROSCOPY Left    MCL   LEFT THUMB CARPOMETACARPAL JOINT SUSPENSIONPLASTY  04-06-2006  DR SISSY   LEFT WRIST ARTHROSCOPY W/ DEBRIDEMENT AND  REMOVAL CYST  03-26-2004  DR SISSY   MYOMECTOMY     RADIOACTIVE SEED GUIDED PARTIAL MASTECTOMY WITH AXILLARY SENTINEL LYMPH NODE BIOPSY Right 11/16/2018   Procedure: RIGHT BREAST RADIOACTIVE SEED GUIDED PARTIAL MASTECTOMY WITH  SENTINEL  NODE BIOPSY;  Surgeon: Vernetta Berg, MD;  Location: [facility] OR;  Service: General;  Laterality: Right;   RIGHT COLECTOMY     RIGHT URETEROSCOPIC STONE EXTRACTION  12-11-2009  DR NORLEEN [facility]   TENDON RELEASE     TONSILLECTOMY     TOTAL HIP ARTHROPLASTY Right 11/26/2016   Procedure: RIGHT TOTAL HIP ARTHROPLASTY ANTERIOR APPROACH;  Surgeon: Lonni CINDERELLA Vernetta, MD;  Location: [facility] ORS;  Service: Orthopedics;  Laterality: Right;   TOTAL HIP ARTHROPLASTY Left 12/06/2023   Procedure: LEFT TOTAL HIP ARTHROPLASTY ANTERIOR APPROACH;  Surgeon: Vernetta Lonni CINDERELLA, MD;  Location: [facility] OR;  Service: Orthopedics;  Laterality: Left;   TOTAL KNEE ARTHROPLASTY Left 12/02/2017   Procedure: LEFT TOTAL KNEE ARTHROPLASTY;  Surgeon: Vernetta Lonni CINDERELLA, MD;  Location: [facility] ORS;  Service: Orthopedics;  Laterality: Left;   TOTAL KNEE ARTHROPLASTY Right 08/03/2022   Procedure: RIGHT TOTAL KNEE ARTHROPLASTY;  Surgeon: Vernetta Lonni CINDERELLA, MD;  Location: [facility] OR;  Service: Orthopedics;  Laterality: Right;   WRIST SURGERY     both   Patient Active Problem List   Diagnosis Date Noted   OSA on CPAP 10/04/2024   Vaginal atrophy 06/29/2024   History of iron deficiency 05/24/2024   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   Falls 05/01/2024   Iron deficiency 05/01/2024   Overactive bladder 02/22/2024   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/2023   SUI (stress urinary incontinence, female) 11/22/2023   RLS (restless legs syndrome) 09/22/2023   Other hyperlipidemia 06/08/2023   Unilateral primary osteoarthritis, left hip 05/11/2023   Depression 03/16/2023   Allergic rhinitis 12/29/2022   Seasonal affective disorder 12/01/2022   BMI 32.0-32.9,adult 12/01/2022   Obesity, Beginning BMI 37.59 12/01/2022   OA (osteoarthritis) of knee 08/03/2022   Status post total right knee replacement 08/03/2022   Stress 07/19/2022   Other constipation 07/19/2022   Atypical chest pain 06/25/2021   Class 2 severe obesity with serious comorbidity and body mass index (BMI) of 37.0 to 37.9 in adult 06/03/2021   Vitamin D  deficiency 06/03/2021   Other fatigue 04/22/2021   SOB (shortness of breath) on exertion 04/22/2021   Lower extremity edema 03/05/2021   Coronary artery calcification 02/20/2020   Pure hypercholesterolemia 02/20/2020   Unilateral primary osteoarthritis, right knee 01/03/2019   Genetic testing 12/07/2018   Family history of breast cancer    Family history of prostate cancer    Family history of colon cancer    Family history of kidney cancer    Malignant neoplasm of upper-outer quadrant of right breast in female, 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/09/2018   Diabetic neuropathy, type II diabetes mellitus (HCC) 11/09/2018    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/02/2017   Trochanteric bursitis, right hip 05/10/2017   Status post total replacement of right hip 11/26/2016   DDD (degenerative disc disease), 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   Low back pain 04/23/2015   Hiatal hernia 04/13/2013   Diabetes mellitus (HCC) 01/26/2013   Umbilical hernia 07/24/2012   Essential hypertension     PCP: Carlin Dale Gull, MD   REFERRING PROVIDER: Lonell Sprang, DO  REFERRING DIAG: M76.11 (ICD-10-CM) - Psoas tendinitis of right side M25.551,G89.29,Z96.641 (ICD-10-CM) - Chronic hip pain after total replacement of right hip joint R26.89 (ICD-10-CM) - Abnormality  of gait due to impairment of balance M25.551,M25.552 (ICD-10-CM) - Bilateral hip pain  THERAPY DIAG:  Pain in right hip  Other low back pain  Muscle weakness (generalized)  Abnormal posture  Unsteadiness on feet  Other abnormalities of gait and mobility  Rationale for Evaluation and Treatment: Rehabilitation  ONSET DATE: following HHPT for TKA 3 years ago   SUBJECTIVE:   SUBJECTIVE STATEMENT: Patient reports that she is overall doing much better and is able to now get in and out of the car without even thinking about it.  PERTINENT HISTORY: Patient has been experiencing Rt hip pain (mainly iliopsoas) and balance issues. Patient has received injections from Dr. Burnetta for iliopsoas pain that assisted with pain. Patient has undergone bilat TKA and bilat THA with no other injuries or surgeries to surrounding areas. Patient intermittently experiencing low back pain. Patient has experienced 5 falls in past year with 2 major falls leading to injuries; has since been cleared by medical team.  May need to check BP prior to sessions   See PMH or personal factors for in depth comorbidities    PAIN:  NPRS scale: 0/10 at beginning of session  Pain location: Rt hip  Pain description: sharp when moving, dull when sitting  Aggravating factors: stairs, into car, bed  mobility/transfers  Relieving factors: heating pad, rest    PRECAUTIONS: Fall  RED FLAGS: Bowel or bladder incontinence: No and Cauda equina syndrome: No   WEIGHT BEARING RESTRICTIONS: No  FALLS:  Has patient fallen in last 6 months? Yes. Number of falls 5; 2 caused injuries (one from a potential medication issue that has since been changed, had on clogs and landed flat with arms by side hitting face, bruised ribs, etc.)  LIVING ENVIRONMENT: Lives with: lives alone Lives in: House/apartment Stairs: Yes: Internal: 1 steps; on right going up Has following equipment at home: Single point cane, Walker - 2 wheeled, and Grab bars  OCCUPATION: retired   PLOF: Independent  PATIENT GOALS: be more independent, reduce pain to be more active, get down on the floor and back   NEXT MD VISIT: not scheduled with Dr. Burnetta, follow up with Dr. Vernetta Sep 05, 2024  OBJECTIVE:  Note: Objective measures were completed at Evaluation unless otherwise noted.  DIAGNOSTIC FINDINGS:  An AP pelvis shows bilateral total hip arthroplasties with no complicating  features.   PATIENT SURVEYS:  PSFS: THE PATIENT SPECIFIC FUNCTIONAL SCALE  Place score of 0-10 (0 = unable to perform activity and 10 = able to perform activity at the same level as before injury or problem)  Activity Date: 08/21/2024 11/07/2024   Get in and out of car   1 7   2. Get in and out of bed   1 7   3. Go up and down steps   3 5   4.      Total Score 1.66 6.33     Total Score = Sum of activity scores/number of activities  Minimally Detectable Change: 3 points (for single activity); 2 points (for average score)  [facility] (nd). The Patient Specific Functional Scale . Retrieved from [email or web address]   COGNITION: 08/21/2024 Overall cognitive status: Within functional limits for tasks assessed     SENSATION: 08/21/2024 Not tested  EDEMA:   08/21/2024 Not tested   MUSCLE LENGTH: 08/21/2024 Not formally assessed secondary to pain   POSTURE:  08/21/2024 rounded shoulders, forward head, and increased thoracic kyphosis  PALPATION: 08/21/2024 TTP at Rt iliopsoas, Rt lateral  glutes, Rt posterior glutes   LOWER EXTREMITY ROM:  ROM Right Eval 08/21/2024 Left Eval 08/21/2024 Rt 11/20/2024  Hip flexion AROM: 63deg PROM: ~95deg AROM: 110deg with back pain noted  Supine AROM: 95deg  Hip extension     Hip abduction     Hip adduction     Hip internal rotation     Hip external rotation     Knee flexion     Knee extension     Ankle dorsiflexion     Ankle plantarflexion     Ankle inversion     Ankle eversion      (Blank rows = not tested)  LOWER EXTREMITY MMT:  MMT Right Eval 08/21/2024 Left Eval 08/21/2024 11/20/2024  Hip flexion (seated) 2+/5 3+/5   Hip extension (prone) 2+/5 3-/5 Rt: 3+/5 Lt: 3+/5  Hip abduction (sidelying) 2+/5 3+/5 Rt: 4-/5 Lt: 4-/5  Hip adduction     Hip internal rotation     Hip external rotation     Knee flexion(seated) 4+/5 5/5   Knee extension(seated) 3-/5 5/5   Ankle dorsiflexion(seated) 5/5 5/5   Ankle plantarflexion     Ankle inversion     Ankle eversion      (Blank rows = not tested)  LOWER EXTREMITY SPECIAL TESTS:  08/21/2024 No testing.    FUNCTIONAL TESTS:  08/21/2024 5 times sit to stand: 18.53s  11/20/2024 14.63s   GAIT: 08/21/2024 Distance walked: not formally assessed  Assistive device utilized: None Level of assistance: supervision  Comments: antalgic gait pattern    [facility] PT Assessment - 09/05/24 0001       Functional Gait  Assessment   Gait Level Surface Walks 20 ft, slow speed, abnormal gait pattern, evidence for imbalance or deviates 10-15 in outside of the 12 in walkway width. Requires more than 7 sec to ambulate 20 ft.    Change in Gait Speed Able to change speed, demonstrates mild gait deviations, deviates 6-10 in outside of the 12 in  walkway width, or no gait deviations, unable to achieve a major change in velocity, or uses a change in velocity, or uses an assistive device.    Gait with Horizontal Head Turns Performs head turns with moderate changes in gait velocity, slows down, deviates 10-15 in outside 12 in walkway width but recovers, can continue to walk.    Gait with Vertical Head Turns Performs task with slight change in gait velocity (eg, minor disruption to smooth gait path), deviates 6 - 10 in outside 12 in walkway width or uses assistive device    Gait and Pivot Turn Pivot turns safely in greater than 3 sec and stops with no loss of balance, or pivot turns safely within 3 sec and stops with mild imbalance, requires small steps to catch balance.    Step Over Obstacle Is able to step over one shoe box (4.5 in total height) but must slow down and adjust steps to clear box safely. May require verbal cueing.    Gait with Narrow Base of Support Ambulates less than 4 steps heel to toe or cannot perform without assistance.    Gait with Eyes Closed Cannot walk 20 ft without assistance, severe gait deviations or imbalance, deviates greater than 15 in outside 12 in walkway width or will not attempt task.    Ambulating Backwards Walks 20 ft, slow speed, abnormal gait pattern, evidence for imbalance, deviates 10-15 in outside 12 in walkway width.    Steps Alternating feet, must use rail.  Total Score 12    FGA comment: Max score 30  Interpretation: 25-28 = low risk fall   19-24 = medium risk fall  < 19 = high risk fall                             TREATMENT        DATE: 11/20/2024 TherAct: Nustep level 4 for 8 minutes with bilat LE only  Sit<>stands with 10# KB from 18 2x5  Supine bridges with adduction squeeze 2x12  Supine marches with PPT 1x20  ROM and MMT measurements with results noted above and discussed with patient  Standing hip hikes at counter 1x10 each side  PT discussed final HEP with additions, progression and  regressions, and provided red TB to add resistance to standing abduction/extension and sit<>stands    TREATMENT        DATE: 11/07/2024 TherEx:  Recumbent bike level 3 for 8 minutes   Neuro Re-Ed:  Staggered stance with horizontal and vertical head turns with both feet fwd ; 1x20 each direction and each foot fwd Lateral walks on foam 3x down and back with intermittent UE use on // bars, though improved compared to last session Step taps to 4 step without UE use with improvement in performance with time 1x20 taps  Step taps to 6 step without UE use with improvement in performance with time 2x20 taps    TREATMENT        DATE: 11/05/2024 TherEx:  Recumbent bike level 3 for 8 minutes   Neuro Re-Ed:  Narrow stance with horizontal and vertical head turns 1x20 each direction Staggered stance with horizontal and vertical head turns with both feet fwd ; 1x16 each direction and each foot fwd Lateral walks on foam pad with transition off/on ; intermittent UE use on // bars and increased difficulty with stepping to Lt  Step taps to 4 step without UE use with improvement in performance with time   TREATMENT        DATE: 10/10/2024 TherEx:  Recumbent bike level 5 for 8 minutes  Thomas/hip flexor stretch on mat table 3x60s with PT applying STM to proximal quad/hip flexor on Rt side   Neuro Re-Ed: all activities below performed with CGA  Narrow stance with horizontal head turns 1x20 with intermittent finger tip touch to // bars for stability  Staggered stance stance with horizontal head turns 1x20 with each foot fwd ; intermittent finger tip touch to // bars for stability  PT challenging stepping reactions in all direction (fwd, back, Rt, Lt) with patient performing with appropriate stepping reactions and no required UE support on // bars other than for a fwd trial Reciprocal stepping over small hurdles with CGA and HHA 1x2 down and back with intermittent toe catching on hurdle and intermittent  lateral LOB    TREATMENT        DATE: 10/08/2024 TherEx:  Recumbent bike level 4 for 8 minutes  Thomas/hip flexor stretch on mat table 3x45s with PT applying STM to proximal quad/hip flexor on Rt side  Patient noting discomfort/pain in lateral thigh during last round   Neuro Re-Ed:  Reciprocal step taps to 6 steps 2x10 with intermittent UE use on parallel bars  No pain noted with activity  Rocker board fwd/back to focus on control and ankle strategy 2x20 taps  Rocker board lateral to focus on control and ankle/hip 1x20 taps  PT discussed balance strategies and how they  are challenged with each activity above  PT discussed difficulty levels with different bases of support, how head turns challenge visual system and vestibular system, and why we start with challenging static vs dynamic      PATIENT EDUCATION:  Education details: HEP, POC, interventions, strength and flexibility  Person educated: Patient Education method: Explanation, Demonstration, Tactile cues, Verbal cues, and Handouts Education comprehension: verbalized understanding, returned demonstration, verbal cues required, and tactile cues required  HOME EXERCISE PROGRAM: Access Code: [email or web address] URL: [email or web address] Date: 11/20/2024 Prepared by: Susannah Daring  Exercises - Standing Hip Abduction with Counter Support  - 1 x daily - 7 x weekly - 2 sets - 5-10 reps - Standing Hip Extension with Counter Support  - 1 x daily - 7 x weekly - 2 sets - 5-10 reps - Modified Thomas Stretch  - 1 x daily - 7 x weekly - 3 sets - 15-30s hold - Standing Hip Flexor Stretch  - 1 x daily - 7 x weekly - 3 sets - 15-30s hold - Step Taps on High Step  - 1 x daily - 7 x weekly - 2 sets - 10 reps - Supine March with Posterior Pelvic Tilt  - 1 x daily - 7 x weekly - 2 sets - 10 reps - Supine Bridge with Mini Swiss Ball Between Knees  - 1 x daily - 7 x weekly - 2 sets - 10 reps - Sit to Stand  - 1 x daily - 7 x weekly - 2 sets -  10 reps - Standing Hip Hiking  - 1 x daily - 7 x weekly - 2 sets - 10 reps  ASSESSMENT:  CLINICAL IMPRESSION:  Patient arrived to session noting overall improvements with functional abilities and pain levels. Patient endorsed that she has made progress to the point where she now is fluid with head turns while walking, able to get off and on the floor, and can stand up from the car without any difficulty or pain. Patient has met all STGs and LTGS and is an official and appropriate candidate for discharge this date.   OBJECTIVE IMPAIRMENTS: Abnormal gait, decreased activity tolerance, decreased balance, decreased coordination, decreased endurance, decreased mobility, difficulty walking, decreased ROM, decreased strength, impaired flexibility, improper body mechanics, postural dysfunction, obesity, and pain.   ACTIVITY LIMITATIONS: bending, sitting, standing, squatting, sleeping, stairs, transfers, and bed mobility  PARTICIPATION LIMITATIONS: interpersonal relationship, community activity, and church  PERSONAL FACTORS: Past/current experiences, Time since onset of injury/illness/exacerbation, and 3+ comorbidities: anxiety, arthritis, cancer history, DM, GERD, HLD, HTN, kidney disease, ulcerative colitis, restless leg syndrome, lumbar disc disease  are also affecting patient's functional outcome.   REHAB POTENTIAL: Good  CLINICAL DECISION MAKING: Evolving/moderate complexity  EVALUATION COMPLEXITY: Moderate   GOALS: Goals reviewed with patient? Yes  SHORT TERM GOALS: Target date: 09/11/2024 Patient will show compliance with initial HEP.  Baseline: Goal status: GOAL MET, 09/13/2024  2.  Patient will report pain levels no greater than 7/10 to show overall improved quality of life. Baseline:  Goal status: GOAL MET, 10/10/2024   LONG TERM GOALS: Target date: 11/21/2024  Patient will be independent with final HEP in order to maintain and progress upon functional gains made within  PT. Baseline:  Goal status: GOAL MET, 11/20/2024  2.  Patient will report pain levels no greater than 4/10 to show overall improved quality of life. Baseline:  Goal status: GOAL MET, 11/20/2024  3.  Patient will increase PSFS to at least 3.66 in order to  show a significant improvement in subjective disability rating. Baseline:  Goal status: GOAL MET, 11/20/2024  4.  Patient will increase bilat hip abduction strength to at least 4-/5 in order to improve biomechanics with functional mobility. Baseline:  Goal status: GOAL MET, 11/20/2024  5.  Patient will increase bilat hip extension strength to at least 3+/5 in order to improve biomechanics with functional mobility. Baseline:  Goal status: GOAL MET, 11/20/2024  6.  Patient will increase Rt hip flexion AROM to at least 90deg in order to improve functional mobility. Baseline:  Goal status: GOAL MET, 11/20/2024  7.  Patient will increase 5xSTS to at least 16.23s in order to decrease chances of falling. Baseline:  Goal status: GOAL MET, 11/20/2024   PLAN:  PT FREQUENCY: 1-2x/week  PT DURATION: 8 weeks  PLANNED INTERVENTIONS: [id number]- PT Re-evaluation, 97750- Physical Performance Testing, 97110-Therapeutic exercises, 97530- Therapeutic activity, [id number]- Neuromuscular re-education, 97535- Self Care, [id number]- Manual therapy, [id number]- Gait training, [phone]- Orthotic Initial, [phone]- Orthotic/Prosthetic subsequent, [phone]- Canalith repositioning, [phone]- Aquatic Therapy, [phone]- Electrical stimulation (unattended), [phone]- Electrical stimulation (manual), [id number]- Vasopneumatic device, [id number]- Ultrasound, [id number]- Traction (mechanical), [id number]- Ionotophoresis 4mg /ml Dexamethasone , [id number] (1-2 muscles), [id number] (3+ muscles)- Dry Needling, Patient/Family education, Balance training, Stair training, Taping, Joint mobilization, Joint manipulation, Spinal manipulation, Spinal mobilization, Scar mobilization, Compression bandaging, Vestibular training, DME instructions,  Cryotherapy, and Moist heat  PLAN FOR NEXT SESSION:   PHYSICAL THERAPY DISCHARGE SUMMARY  Visits from Start of Care: 13  Current functional level related to goals / functional outcomes: See above    Remaining deficits: See above    Education / Equipment: Final HEP with red TB   Patient agrees to discharge. Patient goals were met. Patient is being discharged due to meeting the stated rehab goals.    Susannah Daring, PT, DPT 11/20/24 12:04 PM      "

## 2024-11-20 ENCOUNTER — Ambulatory Visit

## 2024-11-20 DIAGNOSIS — M5459 Other low back pain: Secondary | ICD-10-CM | POA: Diagnosis not present

## 2024-11-20 DIAGNOSIS — R2689 Other abnormalities of gait and mobility: Secondary | ICD-10-CM

## 2024-11-20 DIAGNOSIS — R2681 Unsteadiness on feet: Secondary | ICD-10-CM

## 2024-11-20 DIAGNOSIS — M25551 Pain in right hip: Secondary | ICD-10-CM | POA: Diagnosis not present

## 2024-11-20 DIAGNOSIS — R293 Abnormal posture: Secondary | ICD-10-CM | POA: Diagnosis not present

## 2024-11-20 DIAGNOSIS — M6281 Muscle weakness (generalized): Secondary | ICD-10-CM | POA: Diagnosis not present

## 2024-11-26 ENCOUNTER — Encounter

## 2024-11-27 ENCOUNTER — Ambulatory Visit: Admitting: Neurology

## 2024-11-29 ENCOUNTER — Telehealth: Payer: Self-pay | Admitting: Neurology

## 2024-11-29 NOTE — Telephone Encounter (Signed)
 It is insurance requirement that patient is seen 31-90 days after she receives machine. They will not pay if this visit is not completed and patient will have to return machine to DME. A letter will not assist. Please schedule pt with any NP that can see her before 2/9. Several appointments are open next week.

## 2024-11-29 NOTE — Telephone Encounter (Signed)
 Pt called stating since she had to reschedule appt  Pt DME is requesting a letter to  verify she is still continuing care with our facility

## 2024-11-29 NOTE — Telephone Encounter (Signed)
 CM sent to DME, awaiting response.

## 2024-12-03 ENCOUNTER — Ambulatory Visit: Admitting: Adult Health

## 2024-12-04 ENCOUNTER — Encounter: Payer: Self-pay | Admitting: Adult Health

## 2024-12-04 ENCOUNTER — Telehealth: Admitting: Adult Health

## 2024-12-04 ENCOUNTER — Ambulatory Visit: Admitting: Obstetrics and Gynecology

## 2024-12-04 ENCOUNTER — Telehealth: Payer: Self-pay | Admitting: Hematology and Oncology

## 2024-12-04 ENCOUNTER — Ambulatory Visit

## 2024-12-04 VITALS — Wt 175.0 lb

## 2024-12-04 DIAGNOSIS — G4733 Obstructive sleep apnea (adult) (pediatric): Secondary | ICD-10-CM

## 2024-12-04 DIAGNOSIS — G4761 Periodic limb movement disorder: Secondary | ICD-10-CM | POA: Diagnosis not present

## 2024-12-04 DIAGNOSIS — G2581 Restless legs syndrome: Secondary | ICD-10-CM

## 2024-12-04 DIAGNOSIS — R790 Abnormal level of blood mineral: Secondary | ICD-10-CM

## 2024-12-05 NOTE — Progress Notes (Signed)
 SABRA

## 2024-12-06 ENCOUNTER — Ambulatory Visit: Admitting: Orthopaedic Surgery

## 2024-12-11 ENCOUNTER — Ambulatory Visit

## 2024-12-11 ENCOUNTER — Ambulatory Visit: Admitting: Obstetrics and Gynecology

## 2024-12-12 ENCOUNTER — Ambulatory Visit (INDEPENDENT_AMBULATORY_CARE_PROVIDER_SITE_OTHER): Admitting: Family Medicine

## 2024-12-13 ENCOUNTER — Inpatient Hospital Stay: Admitting: Hematology and Oncology

## 2024-12-18 ENCOUNTER — Ambulatory Visit: Admitting: Obstetrics and Gynecology

## 2024-12-25 ENCOUNTER — Ambulatory Visit: Admitting: Obstetrics and Gynecology

## 2025-01-01 ENCOUNTER — Ambulatory Visit: Admitting: Obstetrics and Gynecology

## 2025-01-08 ENCOUNTER — Ambulatory Visit: Admitting: Obstetrics and Gynecology

## 2025-01-09 ENCOUNTER — Ambulatory Visit (INDEPENDENT_AMBULATORY_CARE_PROVIDER_SITE_OTHER): Admitting: Family Medicine

## 2025-01-17 ENCOUNTER — Inpatient Hospital Stay: Admitting: Hematology and Oncology

## 2025-04-30 ENCOUNTER — Ambulatory Visit: Admitting: Neurology
